# Patient Record
Sex: Female | Born: 1950
Health system: Southern US, Community
[De-identification: ages and names within clinical notes are randomized; demographics above are authoritative.]

## PROBLEM LIST (undated history)

## (undated) DIAGNOSIS — G540 Brachial plexus disorders: Secondary | ICD-10-CM

## (undated) DIAGNOSIS — W19XXXA Unspecified fall, initial encounter: Secondary | ICD-10-CM

## (undated) DIAGNOSIS — K259 Gastric ulcer, unspecified as acute or chronic, without hemorrhage or perforation: Secondary | ICD-10-CM

## (undated) DIAGNOSIS — K219 Gastro-esophageal reflux disease without esophagitis: Secondary | ICD-10-CM

## (undated) DIAGNOSIS — K648 Other hemorrhoids: Secondary | ICD-10-CM

## (undated) DIAGNOSIS — F329 Major depressive disorder, single episode, unspecified: Secondary | ICD-10-CM

## (undated) DIAGNOSIS — G43909 Migraine, unspecified, not intractable, without status migrainosus: Secondary | ICD-10-CM

## (undated) DIAGNOSIS — K2971 Gastritis, unspecified, with bleeding: Secondary | ICD-10-CM

## (undated) DIAGNOSIS — S91331A Puncture wound without foreign body, right foot, initial encounter: Secondary | ICD-10-CM

## (undated) DIAGNOSIS — S0993XA Unspecified injury of face, initial encounter: Secondary | ICD-10-CM

## (undated) DIAGNOSIS — J449 Chronic obstructive pulmonary disease, unspecified: Secondary | ICD-10-CM

## (undated) DIAGNOSIS — D509 Iron deficiency anemia, unspecified: Secondary | ICD-10-CM

## (undated) DIAGNOSIS — M1990 Unspecified osteoarthritis, unspecified site: Secondary | ICD-10-CM

## (undated) DIAGNOSIS — D649 Anemia, unspecified: Secondary | ICD-10-CM

## (undated) DIAGNOSIS — E781 Pure hyperglyceridemia: Secondary | ICD-10-CM

## (undated) DIAGNOSIS — D62 Acute posthemorrhagic anemia: Secondary | ICD-10-CM

## (undated) DIAGNOSIS — G709 Myoneural disorder, unspecified: Secondary | ICD-10-CM

## (undated) DIAGNOSIS — I509 Heart failure, unspecified: Secondary | ICD-10-CM

## (undated) DIAGNOSIS — J45909 Unspecified asthma, uncomplicated: Secondary | ICD-10-CM

## (undated) DIAGNOSIS — E119 Type 2 diabetes mellitus without complications: Secondary | ICD-10-CM

## (undated) DIAGNOSIS — I639 Cerebral infarction, unspecified: Secondary | ICD-10-CM

## (undated) DIAGNOSIS — I10 Essential (primary) hypertension: Secondary | ICD-10-CM

## (undated) DIAGNOSIS — Z87442 Personal history of urinary calculi: Secondary | ICD-10-CM

## (undated) DIAGNOSIS — Z5189 Encounter for other specified aftercare: Secondary | ICD-10-CM

## (undated) DIAGNOSIS — I671 Cerebral aneurysm, nonruptured: Secondary | ICD-10-CM

## (undated) DIAGNOSIS — F32A Depression, unspecified: Secondary | ICD-10-CM

## (undated) DIAGNOSIS — G8929 Other chronic pain: Secondary | ICD-10-CM

## (undated) DIAGNOSIS — T7840XA Allergy, unspecified, initial encounter: Secondary | ICD-10-CM

## (undated) HISTORY — PX: TONSILLECTOMY: SUR1361

## (undated) HISTORY — PX: CARPAL TUNNEL RELEASE: SHX101

## (undated) HISTORY — PX: DILATION AND CURETTAGE OF UTERUS: SHX78

## (undated) HISTORY — PX: HEMATOMA EVACUATION: SHX5118

## (undated) HISTORY — DX: Gastric ulcer, unspecified as acute or chronic, without hemorrhage or perforation: K25.9

## (undated) HISTORY — PX: KNEE ARTHROSCOPY: SUR90

## (undated) HISTORY — PX: SHOULDER ARTHROSCOPY W/ ROTATOR CUFF REPAIR: SHX2400

## (undated) HISTORY — PX: ARTHRODESIS METATARSAL: SHX6565

## (undated) HISTORY — PX: COLONOSCOPY: SHX174

## (undated) HISTORY — DX: Encounter for other specified aftercare: Z51.89

## (undated) HISTORY — PX: TUBAL LIGATION: SHX77

## (undated) HISTORY — DX: Other hemorrhoids: K64.8

## (undated) HISTORY — DX: Gastritis, unspecified, with bleeding: K29.71

## (undated) HISTORY — DX: Cerebral aneurysm, nonruptured: I67.1

## (undated) HISTORY — DX: Unspecified fall, initial encounter: W19.XXXA

## (undated) HISTORY — DX: Puncture wound without foreign body, right foot, initial encounter: S91.331A

## (undated) HISTORY — DX: Iron deficiency anemia, unspecified: D50.9

## (undated) HISTORY — DX: Allergy, unspecified, initial encounter: T78.40XA

## (undated) HISTORY — PX: LAPAROSCOPIC CHOLECYSTECTOMY: SUR755

---
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
## 1984-06-13 HISTORY — PX: BILATERAL SALPINGOOPHORECTOMY: SHX1223

## 1984-06-13 HISTORY — PX: VAGINAL HYSTERECTOMY: SUR661

## 2008-06-13 DIAGNOSIS — E119 Type 2 diabetes mellitus without complications: Secondary | ICD-10-CM

## 2008-06-13 HISTORY — DX: Type 2 diabetes mellitus without complications: E11.9

## 2012-05-13 ENCOUNTER — Observation Stay (HOSPITAL_COMMUNITY)
Admission: EM | Admit: 2012-05-13 | Discharge: 2012-05-14 | Disposition: A | Discharge status: Home / Self Care | Payer: BC Managed Care – PPO | Attending: Internal Medicine | Admitting: Internal Medicine

## 2012-05-13 ENCOUNTER — Encounter (HOSPITAL_COMMUNITY): Payer: Self-pay | Admitting: Nurse Practitioner

## 2012-05-13 DIAGNOSIS — E1149 Type 2 diabetes mellitus with other diabetic neurological complication: Principal | ICD-10-CM | POA: Insufficient documentation

## 2012-05-13 DIAGNOSIS — E1169 Type 2 diabetes mellitus with other specified complication: Secondary | ICD-10-CM

## 2012-05-13 DIAGNOSIS — E1165 Type 2 diabetes mellitus with hyperglycemia: Secondary | ICD-10-CM

## 2012-05-13 DIAGNOSIS — E11621 Type 2 diabetes mellitus with foot ulcer: Secondary | ICD-10-CM

## 2012-05-13 DIAGNOSIS — E11628 Type 2 diabetes mellitus with other skin complications: Secondary | ICD-10-CM | POA: Diagnosis present

## 2012-05-13 DIAGNOSIS — L97909 Non-pressure chronic ulcer of unspecified part of unspecified lower leg with unspecified severity: Secondary | ICD-10-CM | POA: Insufficient documentation

## 2012-05-13 DIAGNOSIS — Z91199 Patient's noncompliance with other medical treatment and regimen due to unspecified reason: Secondary | ICD-10-CM | POA: Insufficient documentation

## 2012-05-13 DIAGNOSIS — M7989 Other specified soft tissue disorders: Secondary | ICD-10-CM

## 2012-05-13 DIAGNOSIS — I10 Essential (primary) hypertension: Secondary | ICD-10-CM

## 2012-05-13 DIAGNOSIS — IMO0002 Reserved for concepts with insufficient information to code with codable children: Secondary | ICD-10-CM

## 2012-05-13 DIAGNOSIS — Z8614 Personal history of Methicillin resistant Staphylococcus aureus infection: Secondary | ICD-10-CM | POA: Insufficient documentation

## 2012-05-13 DIAGNOSIS — L0390 Cellulitis, unspecified: Secondary | ICD-10-CM

## 2012-05-13 DIAGNOSIS — Z79899 Other long term (current) drug therapy: Secondary | ICD-10-CM | POA: Insufficient documentation

## 2012-05-13 DIAGNOSIS — L03119 Cellulitis of unspecified part of limb: Secondary | ICD-10-CM | POA: Diagnosis present

## 2012-05-13 DIAGNOSIS — L97509 Non-pressure chronic ulcer of other part of unspecified foot with unspecified severity: Secondary | ICD-10-CM

## 2012-05-13 DIAGNOSIS — G8929 Other chronic pain: Secondary | ICD-10-CM | POA: Insufficient documentation

## 2012-05-13 DIAGNOSIS — E118 Type 2 diabetes mellitus with unspecified complications: Secondary | ICD-10-CM | POA: Diagnosis present

## 2012-05-13 DIAGNOSIS — Z9119 Patient's noncompliance with other medical treatment and regimen: Secondary | ICD-10-CM | POA: Insufficient documentation

## 2012-05-13 HISTORY — DX: Myoneural disorder, unspecified: G70.9

## 2012-05-13 HISTORY — DX: Essential (primary) hypertension: I10

## 2012-05-13 HISTORY — DX: Other chronic pain: G89.29

## 2012-05-13 HISTORY — DX: Pure hyperglyceridemia: E78.1

## 2012-05-13 LAB — CBC
HCT: 42.9 % (ref 36.0–46.0)
Hemoglobin: 14.5 g/dL (ref 12.0–15.0)
MCH: 33 pg (ref 26.0–34.0)
MCHC: 33.8 g/dL (ref 30.0–36.0)
MCV: 97.7 fL (ref 78.0–100.0)
Platelets: 163 10*3/uL (ref 150–400)
RBC: 4.39 MIL/uL (ref 3.87–5.11)
RDW: 12.9 % (ref 11.5–15.5)
WBC: 9.7 10*3/uL (ref 4.0–10.5)

## 2012-05-13 LAB — BASIC METABOLIC PANEL
Calcium: 10.1 mg/dL (ref 8.4–10.5)
GFR calc non Af Amer: >90 mL/min (ref >90)
Glucose, Bld: 306 mg/dL — ABNORMAL HIGH (ref 70–99)
Sodium: 136 mEq/L (ref 135–145)

## 2012-05-13 LAB — GLUCOSE, CAPILLARY: Glucose-Capillary: 270 mg/dL — ABNORMAL HIGH (ref 70–99)

## 2012-05-13 MED ORDER — BUTALBITAL-APAP-CAFFEINE 50-325-40 MG PO TABS
1.0000 | ORAL_TABLET | Freq: Two times a day (BID) | ORAL | Status: DC | PRN
  Administered 2012-05-13: 1 via ORAL
  Filled 2012-05-13: qty 1

## 2012-05-13 MED ORDER — ENOXAPARIN SODIUM 40 MG/0.4ML ~~LOC~~ SOLN
40.0000 mg | SUBCUTANEOUS | Status: DC
  Administered 2012-05-13: 40 mg via SUBCUTANEOUS
  Filled 2012-05-13 (×2): qty 0.4

## 2012-05-13 MED ORDER — GABAPENTIN 600 MG PO TABS
600.0000 mg | ORAL_TABLET | Freq: Three times a day (TID) | ORAL | Status: DC
  Administered 2012-05-13 – 2012-05-14 (×2): 600 mg via ORAL
  Filled 2012-05-13 (×4): qty 1

## 2012-05-13 MED ORDER — SODIUM CHLORIDE 0.9 % IV BOLUS (SEPSIS)
1000.0000 mL | Freq: Once | INTRAVENOUS | Status: AC
  Administered 2012-05-13: 1000 mL via INTRAVENOUS

## 2012-05-13 MED ORDER — SODIUM CHLORIDE 0.9 % IV SOLN
3.0000 g | Freq: Once | INTRAVENOUS | Status: AC
  Administered 2012-05-13: 3 g via INTRAVENOUS
  Filled 2012-05-13: qty 3

## 2012-05-13 MED ORDER — ACETAMINOPHEN 650 MG RE SUPP: 650.0000 mg | Freq: Four times a day (QID) | RECTAL | Status: DC | PRN

## 2012-05-13 MED ORDER — VANCOMYCIN HCL IN DEXTROSE 1-5 GM/200ML-% IV SOLN
1000.0000 mg | Freq: Three times a day (TID) | INTRAVENOUS | Status: DC
  Administered 2012-05-13 – 2012-05-14 (×2): 1000 mg via INTRAVENOUS
  Filled 2012-05-13 (×4): qty 200

## 2012-05-13 MED ORDER — INSULIN ASPART 100 UNIT/ML ~~LOC~~ SOLN
0.0000 [IU] | Freq: Three times a day (TID) | SUBCUTANEOUS | Status: DC
  Administered 2012-05-13: 5 [IU] via SUBCUTANEOUS
  Administered 2012-05-14: 2 [IU] via SUBCUTANEOUS

## 2012-05-13 MED ORDER — ATENOLOL 50 MG PO TABS
50.0000 mg | ORAL_TABLET | Freq: Every day | ORAL | Status: DC
  Administered 2012-05-13: 50 mg via ORAL
  Filled 2012-05-13 (×2): qty 1

## 2012-05-13 MED ORDER — LISINOPRIL 5 MG PO TABS
5.0000 mg | ORAL_TABLET | Freq: Every day | ORAL | Status: DC
  Administered 2012-05-13 – 2012-05-14 (×2): 5 mg via ORAL
  Filled 2012-05-13 (×2): qty 1

## 2012-05-13 MED ORDER — DULOXETINE HCL 60 MG PO CPEP
90.0000 mg | ORAL_CAPSULE | Freq: Every day | ORAL | Status: DC
  Administered 2012-05-13 – 2012-05-14 (×2): 90 mg via ORAL
  Filled 2012-05-13 (×2): qty 1

## 2012-05-13 MED ORDER — NIACIN ER 500 MG PO TBCR
500.0000 mg | EXTENDED_RELEASE_TABLET | Freq: Two times a day (BID) | ORAL | Status: DC
Start: 2012-05-14 — End: 2012-05-14
  Administered 2012-05-14: 500 mg via ORAL
  Filled 2012-05-13 (×3): qty 1

## 2012-05-13 MED ORDER — ACETAMINOPHEN 325 MG PO TABS
650.0000 mg | ORAL_TABLET | Freq: Four times a day (QID) | ORAL | Status: DC | PRN
  Administered 2012-05-13: 650 mg via ORAL
  Filled 2012-05-13: qty 2

## 2012-05-13 MED ORDER — SODIUM CHLORIDE 0.9 % IV SOLN
INTRAVENOUS | Status: DC
  Administered 2012-05-14: 03:00:00 via INTRAVENOUS

## 2012-05-13 MED ORDER — HYDROCODONE-ACETAMINOPHEN 5-325 MG PO TABS: 1.0000 | ORAL_TABLET | Freq: Every evening | ORAL | Status: DC | PRN

## 2012-05-13 MED ORDER — METFORMIN HCL 500 MG PO TABS
500.0000 mg | ORAL_TABLET | Freq: Two times a day (BID) | ORAL | Status: DC
  Administered 2012-05-14: 500 mg via ORAL
  Filled 2012-05-13 (×3): qty 1

## 2012-05-13 MED ORDER — CYCLOBENZAPRINE HCL 10 MG PO TABS
10.0000 mg | ORAL_TABLET | Freq: Three times a day (TID) | ORAL | Status: DC | PRN
  Administered 2012-05-13: 10 mg via ORAL
  Filled 2012-05-13: qty 1

## 2012-05-13 MED ORDER — SODIUM CHLORIDE 0.9 % IV SOLN
3.0000 g | Freq: Four times a day (QID) | INTRAVENOUS | Status: DC
  Administered 2012-05-13 – 2012-05-14 (×3): 3 g via INTRAVENOUS
  Filled 2012-05-13 (×5): qty 3

## 2012-05-13 MED ORDER — CLINDAMYCIN PHOSPHATE 900 MG/50ML IV SOLN: 900.0000 mg | Freq: Once | INTRAVENOUS | Status: DC

## 2012-05-13 NOTE — ED Notes (Signed)
Pt with reddened tender areas to RLE onset while driving in car from [place] over this past week. Pt has been out of BP meds for past week also. Denies any SOB or CP.

## 2012-05-13 NOTE — Progress Notes (Signed)
VASCULAR LAB PRELIMINARY  PRELIMINARY  PRELIMINARY  PRELIMINARY  Right lower extremity venous Doppler completed.    Preliminary report:  There is no DVT or SVT noted in the right lower extremity.  Tonica Brasington, 05/13/2012, 12:40 PM

## 2012-05-13 NOTE — ED Notes (Signed)
2 nurses with IV start attempts, unsuccessful, IV team paged

## 2012-05-13 NOTE — ED Provider Notes (Signed)
Medical screening examination/treatment/procedure(s) were performed by non-physician practitioner and as supervising physician I was immediately available for consultation/collaboration.   Celene Kras, MD 05/13/12 [phone]

## 2012-05-13 NOTE — Progress Notes (Signed)
ANTIBIOTIC CONSULT NOTE - INITIAL  Pharmacy Consult for vancomycin and unasyn Indication: diabetic foot infection  Allergies  Allergen Reactions  . Soma (Carisoprodol) Itching and Rash    Patient Measurements: Weight: 209 lb 12.8 oz (95.165 kg) Adjusted Body Weight:   Vital Signs: Temp: 99.8 F (37.7 C) (12/01 1600) Temp src: Oral (12/01 1600) BP: 160/108 mmHg (12/01 1600) Pulse Rate: 109  (12/01 1600) Intake/Output from previous day:   Intake/Output from this shift:    Labs:  Basename 05/13/12 1059  WBC 9.7  HGB 14.5  PLT 163  LABCREA --  CREATININE 0.60   CrCl is unknown because there is no height on file for the current visit. No results found for this basename: VANCOTROUGH:2,VANCOPEAK:2,VANCORANDOM:2,GENTTROUGH:2,GENTPEAK:2,GENTRANDOM:2,TOBRATROUGH:2,TOBRAPEAK:2,TOBRARND:2,AMIKACINPEAK:2,AMIKACINTROU:2,AMIKACIN:2, in the last 72 hours   Microbiology: No results found for this or any previous visit (from the past 720 hour(s)).  Medical History: Past Medical History  Diagnosis Date  . Hypertension   . Diabetes mellitus without complication     diagnosed around 2010, only ever on metformin  . Chronic pain     neck pain, headache, neuropathy  . Hypertriglyceridemia     Medications:  Scheduled:    . [COMPLETED] ampicillin-sulbactam (UNASYN) IV  3 g Intravenous Once  . atenolol  50 mg Oral Daily  . DULoxetine  90 mg Oral Daily  . enoxaparin (LOVENOX) injection  40 mg Subcutaneous Q24H  . gabapentin  600 mg Oral TID  . insulin aspart  0-9 Units Subcutaneous TID WC  . lisinopril  5 mg Oral Daily  . metFORMIN  500 mg Oral BID WC  . niacin  500 mg Oral BID WC  . [COMPLETED] sodium chloride  1,000 mL Intravenous Once  . [DISCONTINUED] clindamycin (CLEOCIN) IV  900 mg Intravenous Once   Infusions:    . sodium chloride     Assessment: 61 yo female with diabetic foot infection will be started on vancomycin and unasyn therapy.  CrCl >100.  Had one dose of  unasyn at 1407 today  Goal of Therapy:  Vancomycin trough level 15-20 mcg/ml  Plan:  1) Vancomycin 1g iv q8h and unasyn 3g iv q6h, 1st dose at 2000 2) Monitor renal function and culture and sensitivity 3) Check vancomycin trough when it's appropriate.  [name], [name] 05/13/2012,6:09 PM

## 2012-05-13 NOTE — ED Notes (Signed)
IV team at the bedside. 

## 2012-05-13 NOTE — ED Notes (Signed)
MD at bedside; admitting

## 2012-05-13 NOTE — ED Provider Notes (Signed)
History     CSN: [id number]  Arrival date & time 05/13/12  1047   First MD Initiated Contact with Patient 05/13/12 1146      Chief Complaint  Patient presents with  . Leg Pain    (Consider location/radiation/quality/duration/timing/severity/associated sxs/prior treatment) HPI The patient presents to the ED with R LE pain and swelling for 4 days. She reports noticing a R LE plantar skin lesion with erythremia along the medial aspect of her foot 4 days ago.  Today she reports R knee pain and swelling. She reports a cross country relocation that started in early November, driving approximately hours per day.  She has a family history of DVT in both mother and father.  She reports her last A1C is 10.  She reports being non-compliant with her atenolol 50 mg for 10 days due to running out of her prescription. She denies chest pain, shortness of breath, fever, chills, nausea, vomiting, abdominal pain, diarrhea, dizziness, lightheadedness, syncope, headache, or decrease in vision or blurry vision.   Past Medical History  Diagnosis Date  . Hypertension   . Diabetes mellitus without complication   . Chronic pain     Past Surgical History  Procedure Date  . Rotator cuff repair   . Carpal tunnel release   . Abdominal hysterectomy     History reviewed. No pertinent family history.  History  Substance Use Topics  . Smoking status: Never Smoker   . Smokeless tobacco: Not on file  . Alcohol Use: No    OB History    Grav Para Term Preterm Abortions TAB SAB Ect Mult Living                  Review of Systems All other systems negative except as documented in the HPI. All pertinent positives and negatives as reviewed in the HPI.   Allergies  Soma  Home Medications  No current outpatient prescriptions on file.  BP 143/104  Pulse 124  Temp 99.9 F (37.7 C) (Oral)  Resp 16  SpO2 99%  Physical Exam  Nursing note and vitals reviewed. Constitutional: She appears  well-developed and well-nourished.  HENT:  Head: Normocephalic and atraumatic.  Neck: Neck supple.  Cardiovascular: Regular rhythm, S1 normal and S2 normal.  Tachycardia present.   No murmur heard. Pulmonary/Chest: Effort normal and breath sounds normal. No accessory muscle usage. Not tachypneic. No respiratory distress. She has no decreased breath sounds. She has no wheezes. She has no rhonchi. She has no rales.  Musculoskeletal:       Right knee: She exhibits erythema. She exhibits normal range of motion.       Legs:      Feet:       R LE tenderness to palpation along the tibial tuberosity with erythremia and increased warmth.  1 cm laceration to plantar surface of R foot with surrounding erythremia, tenderness, and increased warmth. R distal medial shin tenderness  erythremia and increased warmth.   Neurological: She is alert.  Skin: Skin is warm and dry.    ED Course  Procedures (including critical care time)  Labs Reviewed  BASIC METABOLIC PANEL - Abnormal; Notable for the following:    Glucose, Bld 306 (*)     All other components within normal limits  CBC    The patient will be admitted for cellulitis. The patient is advised of the plan.    MDM  MDM Reviewed: vitals and nursing note Interpretation: labs  Carlyle Dolly, PA-C 05/13/12 1533

## 2012-05-13 NOTE — H&P (Signed)
Hospital Admission Note Date: 05/13/2012  Patient name: [name] Medical record number: [id number] Date of birth: [birth year] Age: 61 y.o. Gender: female PCP: No primary provider on file.  Medical Service: [facility]  Attending physician:  Dr. Rogelia Boga    1st Contact:  Dr. Burtis Junes  Pager: [phone] 2nd Contact:  Dr. Manson Passey  Pager: [phone] After 5 pm or weekends: 1st Contact:      Pager: [phone] 2nd Contact:      Pager: [phone]  Chief Complaint: Increasing right foot "redness"  History of Present Illness: [name] 61 yo woman pmh HTN, DM w/associated sever neuropathy, and hypertriglyceridemia p/w increasing right foot redness for past 4 days. Pt recently moved to [place], [place] area 2 days ago from [place], [place] after her husband's recent death in 2013/08/032/2 financial stress. She drove from there with her daughter for only about 4 hrs/day with frequent stops. She states that about 4 days ago she noticed a "hole" in her right foot but couldn't find the cause for such a wound. She doesn't perform regular foot care despite her neuropathy and doesn't wear shoes both indoors and outdoors as personal preference. 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 Because of her neuropathy she hasn't noticed much "new pain" from her baseline but did see some surrounding erythema that then began to rapidly expand over the last day and then she became concerned and presented to the ED. She denied any subjective f/c, n/v/d/c, abdominal pain, leg pain, SOB, DOE, CP, HA, weight loss/gain, or associated leg weakness. She did have an established PCP in [place] but hasn't had an office visit in "a long time." Pt doesn't measure CBGs at home and only takes metformin. She was diagnosed with DM about 2-3 yrs ago and has never been on insulin. She has had severe neuropathy per her report since that time requiring maximum dose gabapentin and cymbalta. She is a smoker about 0.5ppd and has about 22 pack year history.  She is compliant with her medications but ran out of atenolol about 2 wks ago on the drive to [place]. She has had extensive surgeries: hysterectomy, cholecystectomy, rotator cuff repair, and carpal tunnel surgery but no artifical joints or pacemakers. 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 She does report depression, decreased appetite eating 1 meal a day, and decreased sleep that is chronic for her. She denied any SI or HI at this time and is still grieving both her husband's death and long time home in [place]. She describes a mechanical injury following the transit of a patient at work that left her with tension HA and chronic cervical neck pain. She was otherwise feeling her normal self and didn't have recent sick contacts.   Meds: Current Outpatient Rx  Name  Route  Sig  Dispense  Refill  . ATENOLOL 50 MG PO TABS   Oral   Take 50 mg by mouth daily.         Marland Kitchen BUTALBITAL-APAP-CAFFEINE 50-325-40 MG PO TABS   Oral   Take 1 tablet by mouth 2 (two) times daily as needed. Stress headache         . CYCLOBENZAPRINE HCL 10 MG PO TABS   Oral   Take 10 mg by mouth every 6 (six) hours as needed. For muscle spasms         . DULOXETINE HCL  30 MG PO CPEP   Oral   Take 30 mg by mouth 2 (two) times daily. 30 mg in addition to 60 mg to equal 90 mg         . DULOXETINE HCL 60 MG PO CPEP   Oral   Take 60 mg by mouth daily. 60 mg in addition to 30 mg to equal 90 mg         . GABAPENTIN 600 MG PO TABS   Oral   Take 600 mg by mouth 3 (three) times daily. 3qam 2 noon 3qhs         . HYDROCODONE-ACETAMINOPHEN 5-325 MG PO TABS   Oral   Take 1 tablet by mouth every 6 (six) hours as needed. For pain         . LISINOPRIL PO   Oral   Take 1 tablet by mouth daily.         Marland Kitchen METFORMIN HCL 500 MG PO TABS   Oral   Take 500 mg by mouth 2 (two) times daily with a meal.         . NIACIN ER  500 MG PO TBCR   Oral   Take 500 mg by mouth 2 (two) times daily with a meal.           Allergies: Allergies as of 05/13/2012 - Review Complete 05/13/2012  Allergen Reaction Noted  . Soma (carisoprodol) Itching and Rash 05/13/2012   Past Medical History  Diagnosis Date  . Hypertension   . Diabetes mellitus without complication     diagnosed around 2010, only ever on metformin  . Chronic pain     neck pain, headache, neuropathy  . Hypertriglyceridemia    Past Surgical History  Procedure Date  . Rotator cuff repair   . Carpal tunnel release   . Abdominal hysterectomy   . Cholecystectomy    History reviewed. No pertinent family history. History   Social History  . Marital Status: Widowed    Spouse Name: N/A    Number of Children: N/A  . Years of Education: N/A   Occupational History  . Not on file.   Social History Main Topics  . Smoking status: Never Smoker   . Smokeless tobacco: Not on file  . Alcohol Use: No  . Drug Use: No  . Sexually Active:    Other Topics Concern  . Not on file   Social History Narrative  . No narrative on file    Review of Systems: Pertinent items are noted in HPI.  Physical Exam: Blood pressure 143/104, pulse 124, temperature 99.6 F (37.6 C), temperature source Oral, resp. rate 16, SpO2 99.00%. 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  Assessment & Plan by Problem: Ms. Stambaugh 61 yo woman pmh uncontrolled DM, HTN, and hypertriglyceridemia p/w increased foot pain suspected diabetic foot ulcer.   1. Diabetic foot ulcer: pt has poorly controlled and unmonitored DM with associated sever neuropathy s/p a wound with poor foot care. No leukocytosis or fever and doesn't appear to be osteomyelitis. Pt has hx of MRSA infxn. Pt tachycardic maybe 2/2 dehydratio -Unasyn and Vanco -imaging if erythema continues to expand or increased pain -cont gabapentin and cymbalta for neuropathy -foot care -IVF NS 100cc/hr for dehydration  2. DM: pt poorly controlled, poor PCP f/u and doesn't check CBGs.  -cont metformin 500 BID -SSI -HgbA1c  3. HTN: pt BPs 160s/100s and reports taking atenolol and lisinopril -cont atenolol 50mg  -start lisinopril 5mg  -continue to monitor  Dispo: Disposition is deferred at this time, awaiting improvement of current medical problems. Anticipated discharge in approximately 1-2 day(s).   The patient does not have a current PCP (No primary provider on file.), therefore is requiring OPC follow-up after discharge.   The patient does not have transportation limitations that hinder transportation to clinic appointments.  Signed:  Christen Bame, MD PGY-1 05/13/2012, 4:09 PM  [phone]

## 2012-05-14 DIAGNOSIS — E1165 Type 2 diabetes mellitus with hyperglycemia: Secondary | ICD-10-CM

## 2012-05-14 LAB — LIPID PANEL
HDL: 38 mg/dL — ABNORMAL LOW (ref >39)
LDL Cholesterol: 102 mg/dL — ABNORMAL HIGH (ref 0–99)
Total CHOL/HDL Ratio: 5.7 RATIO
Triglycerides: 387 mg/dL — ABNORMAL HIGH (ref <150)
VLDL: 77 mg/dL — ABNORMAL HIGH (ref 0–40)

## 2012-05-14 LAB — GLUCOSE, CAPILLARY: Glucose-Capillary: 192 mg/dL — ABNORMAL HIGH (ref 70–99)

## 2012-05-14 LAB — CBC WITH DIFFERENTIAL/PLATELET
Basophils Absolute: 0.1 10*3/uL (ref 0.0–0.1)
HCT: 35.3 % — ABNORMAL LOW (ref 36.0–46.0)
Hemoglobin: 11.5 g/dL — ABNORMAL LOW (ref 12.0–15.0)
Lymphocytes Relative: 23 % (ref 12–46)
Monocytes Absolute: 0.6 10*3/uL (ref 0.1–1.0)
Neutro Abs: 4.1 10*3/uL (ref 1.7–7.7)
Neutrophils Relative %: 63 % (ref 43–77)
RDW: 12.9 % (ref 11.5–15.5)
WBC: 6.6 10*3/uL (ref 4.0–10.5)

## 2012-05-14 LAB — COMPREHENSIVE METABOLIC PANEL
AST: 41 U/L — ABNORMAL HIGH (ref 0–37)
Albumin: 3.1 g/dL — ABNORMAL LOW (ref 3.5–5.2)
Calcium: 9.2 mg/dL (ref 8.4–10.5)
Creatinine, Ser: 0.57 mg/dL (ref 0.50–1.10)
GFR calc non Af Amer: >90 mL/min (ref >90)

## 2012-05-14 LAB — MICROALBUMIN / CREATININE URINE RATIO
Creatinine, Urine: 96.2 mg/dL
Microalb Creat Ratio: 41.6 mg/g — ABNORMAL HIGH (ref 0.0–30.0)
Microalb, Ur: 4 mg/dL — ABNORMAL HIGH (ref 0.00–1.89)

## 2012-05-14 LAB — PROTIME-INR: INR: 1.01 (ref 0.00–1.49)

## 2012-05-14 MED ORDER — CLINDAMYCIN HCL 300 MG PO CAPS: 300.0000 mg | ORAL_CAPSULE | Freq: Three times a day (TID) | ORAL | Status: AC

## 2012-05-14 MED ORDER — LISINOPRIL 20 MG PO TABS: 20.0000 mg | ORAL_TABLET | Freq: Every day | ORAL | Status: DC

## 2012-05-14 NOTE — Discharge Summary (Signed)
Internal Medicine Teaching [facility] Discharge Note  Name: [name] MRN: [id number] DOB: 07/30/1950 61 y.o.  Date of Admission: 05/13/2012 11:42 AM Date of Discharge: 05/14/2012 Attending Physician: Burns Spain, MD  Discharge Diagnosis: Principal Problem:  *Diabetic foot ulcer Active Problems:  Diabetes type 2, uncontrolled  Hypertension  Hypertriglyceridemia   Discharge Medications:   Medication List     As of 05/14/2012 11:53 AM    STOP taking these medications         atenolol 50 MG tablet   Commonly known as: TENORMIN      TAKE these medications         butalbital-acetaminophen-caffeine 50-325-40 MG per tablet   Commonly known as: FIORICET, ESGIC   Take 1 tablet by mouth 2 (two) times daily as needed. Stress headache      clindamycin 300 MG capsule   Commonly known as: CLEOCIN   Take 1 capsule (300 mg total) by mouth 3 (three) times daily.      cyclobenzaprine 10 MG tablet   Commonly known as: FLEXERIL   Take 10 mg by mouth every 6 (six) hours as needed. For muscle spasms      DULoxetine 30 MG capsule   Commonly known as: CYMBALTA   Take 30 mg by mouth 2 (two) times daily. 30 mg in addition to 60 mg to equal 90 mg      DULoxetine 60 MG capsule   Commonly known as: CYMBALTA   Take 60 mg by mouth daily. 60 mg in addition to 30 mg to equal 90 mg      gabapentin 600 MG tablet   Commonly known as: NEURONTIN   Take 600 mg by mouth 3 (three) times daily. 3qam 2 noon 3qhs      HYDROcodone-acetaminophen 5-325 MG per tablet   Commonly known as: NORCO/VICODIN   Take 1 tablet by mouth every 6 (six) hours as needed. For pain      lisinopril 20 MG tablet   Commonly known as: PRINIVIL,ZESTRIL   Take 1 tablet (20 mg total) by mouth daily.      metFORMIN 500 MG tablet   Commonly known as: GLUCOPHAGE   Take 500 mg by mouth 2 (two) times daily with a meal.      niacin 500 MG tablet   Commonly known as: SLO-NIACIN   Take 500 mg by mouth 2 (two)  times daily with a meal.        Disposition and follow-up:   [name] was discharged from [facility] in stable condition.  At the hospital follow up visit please address DM management, DM education, foot care, and re-establishing PCP since recent move.   Follow-up Appointments:      Discharge Orders    Future Appointments: Provider: Department: Dept Phone: Center:   05/17/2012 8:45 AM Manuela Schwartz, MD [facility] [phone] [facility]   05/17/2012 11:30 AM Cecil Cranker Plyler, RD [facility] [phone] [facility]     Future Orders Please Complete By Expires   Diet - low sodium heart healthy      Increase activity slowly         Admission HPI: [name] 61 yo woman pmh HTN, DM w/associated sever neuropathy, and hypertriglyceridemia p/w increasing right foot redness for past 4 days. Pt recently moved to [place], [place] area 2 days ago from [place], [place] after her husband's recent death in 08-12-132/2 financial stress. She drove from  there with her daughter for only about 4 hrs/day with frequent stops. She states that about 4 days ago she noticed a "hole" in her right foot but couldn't find the cause for such a wound. She doesn't perform regular foot care despite her neuropathy and doesn't wear shoes both indoors and outdoors as personal preference. 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 Because of her neuropathy she hasn't noticed much "new pain" from her baseline but did see some surrounding erythema that then began to rapidly expand over the last day and then she became concerned and presented to the ED. She denied any subjective f/c, n/v/d/c, abdominal pain, leg pain, SOB, DOE, CP, HA, weight loss/gain, or associated leg weakness. She did have an established PCP in [place] but hasn't had an office visit in "a long time." Pt doesn't measure CBGs at home and only takes metformin. She was diagnosed with DM about 2-3  yrs ago and has never been on insulin. She has had severe neuropathy per her report since that time requiring maximum dose gabapentin and cymbalta. She is a smoker about 0.5ppd and has about 22 pack year history. She is compliant with her medications but ran out of atenolol about 2 wks ago on the drive to [place]. She has had extensive surgeries: hysterectomy, cholecystectomy, rotator cuff repair, and carpal tunnel surgery but no artifical joints or pacemakers. 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 She does report depression, decreased appetite eating 1 meal a day, and decreased sleep that is chronic for her. She denied any SI or HI at this time and is still grieving both her husband's death and long time home in [place]. She describes a mechanical injury following the transit of a patient at work that left her with tension HA and chronic cervical neck pain. She was otherwise feeling her normal self and didn't have recent sick contacts.   Hospital Course by problem list: 1. Diabetic foot ulcer: pt has poorly controlled and unmonitored DM with associated severe neuropathy s/p a wound with poor foot care. No leukocytosis or fever and doesn't appear to be osteomyelitis. Pt has hx of MRSA infxn while taking care of of her sick husband. Pt was origionally tachycardic and resolved w/IVF. She received IV Unasyn and Vanco and then stopped on d/c. She was continued on a 7 day course of Ciprofloxacin.   2. DM: pt poorly controlled HgbA1c 8.3, poor PCP f/u and doesn't check CBGs at home. Only taking metformin 500 BID   3. HTN: pt BPs 160s/100s and reports taking atenolol and lisinopril at home but had recently ran out. She was continued  atenolol 50mg  and started lisinopril 5mg     Discharge Vitals:  BP 171/85  Pulse 106  Temp 98.6 F (37 C) (Oral)  Resp 18  Ht 5\' 7"  (1.702 m)  Wt 209 lb 12.8 oz (95.165 kg)   BMI 32.86 kg/m2  SpO2 94% General: resting in bed, NAD  HEENT: PERRL, EOMI, no scleral icterus  Cardiac: RRR, no rubs, murmurs or gallops  Pulm: clear to auscultation bilaterally, moving normal volumes of air  Abd: soft, nontender, nondistended, BS present  Ext: warm and well perfused, no pedal edema, right foot with 2mm circular lesion under the 1st MP joint with some surrounding erythema much decreased from demarked area yesterday. 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, intact sensation on LE with some decreased on soles of feet bilaterally  Neuro: alert and oriented X3, cranial nerves II-XII grossly intact     Discharge Labs:  Results for orders placed during the hospital encounter of 05/13/12 (from the past 24 hour(s))  TSH     Status: Normal   Collection Time   05/13/12  6:44 PM      Component Value Range   TSH 1.582  0.350 - 4.500 uIU/mL  HEMOGLOBIN A1C     Status: Abnormal   Collection Time   05/13/12  6:44 PM      Component Value Range   Hemoglobin A1C 8.3 (*) <5.7 %   Mean Plasma Glucose 192 (*) <117 mg/dL  GLUCOSE, CAPILLARY     Status: Abnormal   Collection Time   05/13/12  7:09 PM      Component Value Range   Glucose-Capillary 270 (*) 70 - 99 mg/dL   Comment 1 Notify RN    MRSA PCR SCREENING     Status: Normal   Collection Time   05/13/12  9:09 PM      Component Value Range   MRSA by PCR NEGATIVE  NEGATIVE  GLUCOSE, CAPILLARY     Status: Abnormal   Collection Time   05/13/12  9:25 PM      Component Value Range   Glucose-Capillary 213 (*) 70 - 99 mg/dL   Comment 1 Notify RN    COMPREHENSIVE METABOLIC PANEL     Status: Abnormal   Collection Time   05/14/12  5:00 AM      Component Value Range   Sodium 139  135 - 145 mEq/L   Potassium 4.1  3.5 - 5.1 mEq/L   Chloride 105  96 - 112 mEq/L   CO2 25  19 - 32  mEq/L   Glucose, Bld 200 (*) 70 - 99 mg/dL   BUN 7  6 - 23 mg/dL   Creatinine, Ser 4.40  0.50 - 1.10 mg/dL   Calcium 9.2  8.4 - 10.2 mg/dL   Total Protein 6.6  6.0 - 8.3 g/dL   Albumin 3.1 (*) 3.5 - 5.2 g/dL   AST 41 (*) 0 - 37 U/L   ALT 38 (*) 0 - 35 U/L   Alkaline Phosphatase 66  39 - 117 U/L   Total Bilirubin 0.3  0.3 - 1.2 mg/dL   GFR calc non Af Amer >90  >90 mL/min   GFR calc Af Amer >90  >90 mL/min  PROTIME-INR     Status: Normal   Collection Time   05/14/12  5:00 AM      Component Value Range   Prothrombin Time 13.2  11.6 - 15.2 seconds   INR 1.01  0.00 - 1.49  CBC WITH DIFFERENTIAL     Status: Abnormal   Collection Time   05/14/12  5:00 AM      Component Value Range   WBC 6.6  4.0 - 10.5 K/uL   RBC 3.61 (*) 3.87 - 5.11 MIL/uL   Hemoglobin 11.5 (*) 12.0 - 15.0 g/dL   HCT 72.5 (*) 36.6 - 44.0 %   MCV 97.8  78.0 - 100.0 fL   MCH 31.9  26.0 - 34.0 pg   MCHC 32.6  30.0 - 36.0 g/dL   RDW 34.7  42.5 - 95.6 %   Platelets 153  150 - 400 K/uL  Neutrophils Relative 63  43 - 77 %   Neutro Abs 4.1  1.7 - 7.7 K/uL   Lymphocytes Relative 23  12 - 46 %   Lymphs Abs 1.5  0.7 - 4.0 K/uL   Monocytes Relative 10  3 - 12 %   Monocytes Absolute 0.6  0.1 - 1.0 K/uL   Eosinophils Relative 4  0 - 5 %   Eosinophils Absolute 0.2  0.0 - 0.7 K/uL   Basophils Relative 1  0 - 1 %   Basophils Absolute 0.1  0.0 - 0.1 K/uL  LIPID PANEL     Status: Abnormal   Collection Time   05/14/12  5:00 AM      Component Value Range   Cholesterol 217 (*) 0 - 200 mg/dL   Triglycerides 960 (*) <150 mg/dL   HDL 38 (*) >45 mg/dL   Total CHOL/HDL Ratio 5.7     VLDL 77 (*) 0 - 40 mg/dL   LDL Cholesterol 409 (*) 0 - 99 mg/dL  GLUCOSE, CAPILLARY     Status: Abnormal   Collection Time   05/14/12  7:43 AM      Component Value Range   Glucose-Capillary 192 (*) 70 - 99 mg/dL   Comment 1 Notify RN      Signed: Christen Bame 05/14/2012, 11:53 AM   Time Spent on Discharge: 35 min Services Ordered on Discharge:  none Equipment Ordered on Discharge: *none

## 2012-05-14 NOTE — Progress Notes (Signed)
Patient discharged to home. Discharge teaching completed including follow up care and medications.  Patient verbalizes understanding with no further questions.  Discharged per wheelchair with family.  Vital signs stable.

## 2012-05-14 NOTE — Progress Notes (Signed)
Subjective: Pt had no acute events overnight. Reports feeling much improved from yesterday. Pt anticipating and asking to go home today to see new grandson born this AM.   Objective: Vital signs in last 24 hours: Filed Vitals:   05/13/12 2142 05/14/12 0202 05/14/12 0537 05/14/12 1000  BP: 156/78 165/80 158/89 171/85  Pulse: 89 73 70 106  Temp: 98.4 F (36.9 C) 97.8 F (36.6 C) 98.2 F (36.8 C) 98.6 F (37 C)  TempSrc: Oral Oral Oral   Resp: 17 18 15 18   Height:      Weight:      SpO2: 95% 94% 97% 94%   Weight change:   Intake/Output Summary (Last 24 hours) at 05/14/12 1150 Last data filed at 05/14/12 0600  Gross per 24 hour  Intake 301.67 ml  Output    750 ml  Net -448.33 ml   General: resting in bed, NAD HEENT: PERRL, EOMI, no scleral icterus Cardiac: RRR, no rubs, murmurs or gallops Pulm: clear to auscultation bilaterally, moving normal volumes of air Abd: soft, nontender, nondistended, BS present Ext: warm and well perfused, no pedal edema, right foot with 2mm circular lesion under the 1st MP joint with some surrounding erythema much decreased from demarked area yesterday. 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, intact sensation on LE with some decreased on soles of feet bilaterally Neuro: alert and oriented X3, cranial nerves II-XII grossly intact  Lab Results: Basic Metabolic Panel:  Lab 05/14/12 4098 05/13/12 1059  NA 139 136  K 4.1 3.5  CL 105 98  CO2 25 24  GLUCOSE 200* 306*  BUN 7 8  CREATININE 0.57 0.60  CALCIUM 9.2 10.1  MG -- --  PHOS -- --   Liver Function Tests:  Lab 05/14/12 0500  AST 41*  ALT 38*  ALKPHOS 66  BILITOT 0.3  PROT 6.6  ALBUMIN 3.1*   CBC:  Lab 05/14/12 0500 05/13/12 1059  WBC 6.6 9.7  NEUTROABS 4.1 --  HGB 11.5* 14.5  HCT 35.3* 42.9  MCV 97.8 97.7  PLT 153 163     CBG:  Lab 05/14/12 0743 05/13/12 2125 05/13/12 1909  GLUCAP 192* 213* 270*   Hemoglobin A1C:  Lab 05/13/12 1844  HGBA1C 8.3*   Fasting Lipid Panel:  Lab 05/14/12 0500  CHOL 217*  HDL 38*  LDLCALC 102*  TRIG 387*  CHOLHDL 5.7  LDLDIRECT --   Thyroid Function Tests:  Lab 05/13/12 1844  TSH 1.582  T4TOTAL --  FREET4 --  T3FREE --  THYROIDAB --   Coagulation:  Lab 05/14/12 0500  LABPROT 13.2  INR 1.01    Micro Results: Recent Results (from the past 240 hour(s))  MRSA PCR SCREENING     Status: Normal   Collection Time   05/13/12  9:09 PM      Component Value Range Status Comment   MRSA by PCR NEGATIVE  NEGATIVE Final    Studies/Results: No results found. Medications: I have reviewed the patient's current medications. Scheduled Meds:   . [COMPLETED] ampicillin-sulbactam (UNASYN) IV  3 g Intravenous Once  . DULoxetine  90 mg Oral Daily  . gabapentin  600 mg Oral TID  . insulin aspart  0-9 Units Subcutaneous TID WC  . lisinopril  20 mg Oral Daily  . metFORMIN  500 mg Oral BID WC  . niacin  500 mg Oral  BID WC  . [COMPLETED] sodium chloride  1,000 mL Intravenous Once  . [DISCONTINUED] ampicillin-sulbactam (UNASYN) IV  3 g Intravenous Q6H  . [DISCONTINUED] atenolol  50 mg Oral Daily  . [DISCONTINUED] clindamycin (CLEOCIN) IV  900 mg Intravenous Once  . [DISCONTINUED] enoxaparin (LOVENOX) injection  40 mg Subcutaneous Q24H  . [DISCONTINUED] lisinopril  5 mg Oral Daily  . [DISCONTINUED] vancomycin  1,000 mg Intravenous Q8H   Continuous Infusions:   . sodium chloride 100 mL/hr at 05/14/12 0259   PRN Meds:.acetaminophen, acetaminophen, butalbital-acetaminophen-caffeine, cyclobenzaprine, HYDROcodone-acetaminophen Assessment/Plan: Ms. Fitzner 61 yo woman pmh uncontrolled DM, HTN, and hypertriglyceridemia p/w increased foot pain suspected diabetic foot ulcer.   1. Diabetic foot ulcer: pt has poorly controlled and unmonitored DM with associated sever  neuropathy s/p a wound with poor foot care. No leukocytosis or fever and doesn't appear to be osteomyelitis. Pt has hx of MRSA infxn. Pt tachycardic resolved this AM - d/c Unasyn and Vanco  -transition to oral clindamycin for 7 day course -education of foot care -DM educator    2. DM: pt poorly controlled HgbA1c 8.3, poor PCP f/u and doesn't check CBGs at home.  -cont metformin 500 BID  -SSI   3. HTN: pt BPs 160s/100s and reports taking atenolol and lisinopril  - will d/c atenelol in setting of diabetes -start lisinopril 20mg  -continue to monitor   Dispo: Disposition is deferred at this time, awaiting improvement of current medical problems. Anticipated discharge in approximately 1-2 day(s).  The patient does not have a current PCP (No primary provider on file.), therefore is requiring OPC follow-up after discharge.  The patient does not have transportation limitations that hinder transportation to clinic appointments.    LOS: 1 day   Christen Bame 05/14/2012, 11:50 AM [phone]

## 2012-05-14 NOTE — H&P (Signed)
Internal Medicine teaching Service Attending Dr.Ernestyne Caldwell. I have personally examined the patient and reviewed the h and P documented by the Resident. In brief  Chief complaint:Increased right foot pain  HOPI: DM with h/o MRSA cellulitis with recent hisotory of long car ride with diabetic neuropathy. 9 point review of system as documented in the Resident note. Social history admitting medication family history past surgical history allergies reviewed. Physical examination Notable for: Erythema around a black callus on the arch of her right foot. Folliculitis and swelling of the right leg just below the knee joint. Labs are significant for : uncontrolled DM  Imaging is significant for: EKG: Sinus tachycardia  A and P: Agree with resident note. Will need debridement of the callus area. Could obtain culture from the region of folliculitis to guide antibiotic therapy.

## 2012-05-15 ENCOUNTER — Telehealth: Payer: Self-pay | Admitting: Dietician

## 2012-05-15 NOTE — Telephone Encounter (Signed)
Discharge date:05-14-12 Call date: 05-15-12 Hospital follow up appointment date: 05-17-12  Calling to assist with transition of care from hospital to home.  Discharge medications reviewed: out of the following medicines and does NOT have prescriptions- thought she'd get them at hospital discharge. She is not sure if she should wait until Thursday to get them. Needs metformin, butalbital, cymbalta 30 and 60, gabapentin, vicodin.   Able to fill all prescriptions? No see- the above  Patient aware of hospital follow up appointments. Yes she was aware of both physician and CDE appointments  No problems with transportation. Will ask on follow up phone call to patient about if she should wait until thursday to take the medicines she is out of.  Other problems/concerns: no meter, but this can wait until Thursday to address  Will route to Dr, Burtis Junes for her recommendation.

## 2012-05-16 NOTE — Telephone Encounter (Signed)
Spoke with attending physician who felt patient is okay waiting until Thursday to get prescriptions for listed medicines. Tried to call pt to notify her of this.

## 2012-05-17 ENCOUNTER — Ambulatory Visit (INDEPENDENT_AMBULATORY_CARE_PROVIDER_SITE_OTHER): Payer: Self-pay | Admitting: Internal Medicine

## 2012-05-17 ENCOUNTER — Ambulatory Visit (INDEPENDENT_AMBULATORY_CARE_PROVIDER_SITE_OTHER): Payer: Self-pay | Admitting: Dietician

## 2012-05-17 ENCOUNTER — Encounter: Payer: Self-pay | Admitting: Licensed Clinical Social Worker

## 2012-05-17 ENCOUNTER — Encounter: Payer: Self-pay | Admitting: Internal Medicine

## 2012-05-17 VITALS — BP 180/113 | HR 104 | Temp 97.2°F | Ht 66.0 in | Wt 209.2 lb

## 2012-05-17 DIAGNOSIS — E11621 Type 2 diabetes mellitus with foot ulcer: Secondary | ICD-10-CM

## 2012-05-17 DIAGNOSIS — L02419 Cutaneous abscess of limb, unspecified: Secondary | ICD-10-CM

## 2012-05-17 DIAGNOSIS — E781 Pure hyperglyceridemia: Secondary | ICD-10-CM

## 2012-05-17 DIAGNOSIS — L03115 Cellulitis of right lower limb: Secondary | ICD-10-CM

## 2012-05-17 DIAGNOSIS — E1165 Type 2 diabetes mellitus with hyperglycemia: Secondary | ICD-10-CM

## 2012-05-17 DIAGNOSIS — E1142 Type 2 diabetes mellitus with diabetic polyneuropathy: Secondary | ICD-10-CM

## 2012-05-17 DIAGNOSIS — E1140 Type 2 diabetes mellitus with diabetic neuropathy, unspecified: Secondary | ICD-10-CM

## 2012-05-17 DIAGNOSIS — S91331A Puncture wound without foreign body, right foot, initial encounter: Secondary | ICD-10-CM

## 2012-05-17 DIAGNOSIS — E1169 Type 2 diabetes mellitus with other specified complication: Secondary | ICD-10-CM

## 2012-05-17 DIAGNOSIS — IMO0002 Reserved for concepts with insufficient information to code with codable children: Secondary | ICD-10-CM

## 2012-05-17 DIAGNOSIS — L97509 Non-pressure chronic ulcer of other part of unspecified foot with unspecified severity: Secondary | ICD-10-CM

## 2012-05-17 DIAGNOSIS — IMO0001 Reserved for inherently not codable concepts without codable children: Secondary | ICD-10-CM

## 2012-05-17 DIAGNOSIS — I10 Essential (primary) hypertension: Secondary | ICD-10-CM

## 2012-05-17 DIAGNOSIS — E1149 Type 2 diabetes mellitus with other diabetic neurological complication: Secondary | ICD-10-CM

## 2012-05-17 DIAGNOSIS — S91309A Unspecified open wound, unspecified foot, initial encounter: Secondary | ICD-10-CM

## 2012-05-17 DIAGNOSIS — L03119 Cellulitis of unspecified part of limb: Secondary | ICD-10-CM

## 2012-05-17 HISTORY — DX: Puncture wound without foreign body, right foot, initial encounter: S91.331A

## 2012-05-17 LAB — GLUCOSE, CAPILLARY: Glucose-Capillary: 205 mg/dL — ABNORMAL HIGH (ref 70–99)

## 2012-05-17 MED ORDER — METFORMIN HCL 500 MG PO TABS: 1000.0000 mg | ORAL_TABLET | Freq: Two times a day (BID) | ORAL | Status: DC

## 2012-05-17 MED ORDER — METOPROLOL TARTRATE 50 MG PO TABS: 50.0000 mg | ORAL_TABLET | Freq: Two times a day (BID) | ORAL | Status: DC

## 2012-05-17 MED ORDER — LISINOPRIL 20 MG PO TABS: 20.0000 mg | ORAL_TABLET | Freq: Every day | ORAL | Status: DC

## 2012-05-17 NOTE — Assessment & Plan Note (Signed)
Resolving, no edema today, mild erythema -cont Clindamycin

## 2012-05-17 NOTE — Progress Notes (Signed)
CSW met with Ms. Tomczak after pt's scheduled [facility] appt.  Ms. Tonkovich has recently relocated to the [place] area from [place].  Pt spouse passes away in Dec 09, 2011.  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  Ms. Feeback is insured through [facility] [place] which pt switched to and became effective as of Dec. 1, 2013.  Prior to the move, Ms. Silverio was seen at [facility] in [place].  Pt voiced concern with her housing situation and services for her grandchild.   Ms. Troop currently lives in Daughter #2 home and was recently told pt and Daughter #1 and children will need to move.  Daughter #2 recently gave birth and will be bring home infant and currently there are too many people in the household.  Pt does have income.  Pt states she desires a one floor plan, 3 bedroom rental for $900 or less a month.  CSW utilized [employer] and provided pt with listing of rental within pt's criteria.   Pt also voiced concern for her grandchildren of Daughter #2, 59 year old female, had mental disorder and the 59 year old female, has developmental disorder.  Daughter is switching children from [employer] to [facility] Medicaid.  Children are currently not in school due to the need to have birth certificates ordered from [place].  CSW provided information to [facility] for pt to inquire about establishing medical care for children.  And, CSW provided listing from [place] of mental health agencies for 59 year old and the 24/7 crisis line.  CSW contacted CPS, there is no deadline to get children enrolled in a [employer] per CPS. Pt denies add'l needs at this time and is aware CSW is available to assist as needed.

## 2012-05-17 NOTE — Assessment & Plan Note (Signed)
BP Readings from Last 3 Encounters:  05/17/12 180/113  05/14/12 171/85    Sodium  Date Value Range Status  05/14/2012 139  135 - 145 mEq/L Final     Potassium  Date Value Range Status  05/14/2012 4.1  3.5 - 5.1 mEq/L Final     Creatinine, Ser  Date Value Range Status  05/14/2012 0.57  0.50 - 1.10 mg/dL Final    Assessment:  Blood pressure control: severely elevated  Progress toward BP goal:  deteriorated  Comments: will resume beta-blocker today as pt has persistent tachycardia  Plan:  Medications:  Start metoprolol 50mg  bid and continue lisinopril at 20 mg qd  Educational resources provided: brochure;handout  Self management tools provided:    Other plans: bp recheck in 1 week

## 2012-05-17 NOTE — Patient Instructions (Addendum)
General Instructions: Your prescriptions have been called into the [facility] on [address]. -For your blood pressure: Start taking metoprolol 50 mg twice a day Take only 20 mg of Lisinopril once a day  -For your diabetes Pick up the Metformin from the pharmacy and take 1000 mg twice a day. You will meet with the Diabetic Educator today.  -Return to clinic in 1 week for a blood pressure recheck.   Treatment Goals:  Goals (1 Years of Data) as of 05/17/2012    None      Progress Toward Treatment Goals:  Treatment Goal 05/17/2012  Hemoglobin A1C unchanged  Blood pressure deteriorated    Self Care Goals & Plans:  Self Care Goal 05/17/2012  Manage my medications take my medicines as prescribed; bring my medications to every visit; refill my medications on time; follow the sick day instructions if I am sick  Monitor my health keep track of my blood pressure; check my feet daily  Eat healthy foods eat more vegetables; eat fruit for snacks and desserts; eat smaller portions; eat foods that are low in salt; drink diet soda or water instead of juice or soda       Care Management & Community Referrals:  Referral 05/17/2012  Referrals made for care management support diabetes educator; Child psychotherapist

## 2012-05-17 NOTE — Progress Notes (Signed)
  Subjective:    Patient ID: [name], female    DOB: 1951-04-29, 61 y.o.   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 She was admitted for a diabetic foot wound and cellulitis of the right MTP area and right shin.  She was treated with IV Vancomycin and Zosyn x1 day then transitioned to oral clindamycin which she reports compliance.  She has been afebrile, without chills, calf pain, sob, chest pain, myalgia or purulent drainage from the wound since discharge.  Of note, she has poor foot hygiene and often walks barefoot inside and outside.  Today she has slipper type shoes on without socks. She states that the erythema on her foot and shin has improved but she still has tenderness over the shin.  She has not been taking the metformin stating that she was not provided a prescription. She reports that she has only been taking lisinopril 40 mg, niacin, flexeril, gabapentin, and clindamycin.  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  Review of Systems  Constitutional: Negative for fever and chills.  Respiratory: Positive for shortness of breath.   Cardiovascular: Negative for chest pain, palpitations and leg swelling.  Musculoskeletal: Negative for arthralgias.  Skin: Positive for wound. Negative for rash.  Hematological: Does not bruise/bleed easily.       Objective:   Physical Exam  Constitutional: She is oriented to person, place, and time. She appears well-developed and well-nourished. No distress.  HENT:  Head: Normocephalic and atraumatic.  Neck: Normal range of motion. Neck supple.  Cardiovascular: Regular rhythm.  Tachycardia present.   No murmur heard. Pulmonary/Chest:  Effort normal and breath sounds normal.  Abdominal: Soft. Bowel sounds are normal.  Musculoskeletal: Normal range of motion. She exhibits no edema.       Legs:      Feet:  Neurological: She is alert and oriented to person, place, and time.  Skin: Skin is warm and dry.     Psychiatric: She has a normal mood and affect.          Assessment & Plan:  1. Htn: above goal, will resume beta blocker (pt needs cardioprotective benefits outweigh possible insulin resistance caused by BB)and continue ACEi -start metoprolol 50 mg bid which is equivalent to her prior regimen of atenolol 100 mg qd -continue lisinopril at 20 mg qd (pt reports taking 40 mg) -uptitrate as needed on f/u  2. DM Type2: uncontrolled, HgbA1c >8 -increase Metformin to 1000 mg bid -DME today  3. Foot wound with cellulitis: this appears more of a puncture wound than diabetic ulcer, pt reports having Tetanus shot 3 years ago at [facility] in [place] -cont 7 day course of Clindamycin  4. Cellulitis of right leg: improving on clindamycin, no calf tenderness or signs of DVT -cont to monitor improvement  5. Hypertriglyceridemia: pt with h/o TG levels ~3500 per her report, counseled on need for adequate control, pt declines gemfibrozil (Lopid) therapy and statins -cont Niacin -cont to monitor lipid level

## 2012-05-17 NOTE — Assessment & Plan Note (Addendum)
Hemoglobin A1C  Date Value Range Status  05/13/2012 8.3* <5.7 % Final     (NOTE)                                                                               According to the ADA Clinical Practice Recommendations for 2011, when     HbA1c is used as a screening test:      >=6.5%   Diagnostic of Diabetes Mellitus               (if abnormal result is confirmed)     5.7-6.4%   Increased risk of developing Diabetes Mellitus     References:Diagnosis and Classification of Diabetes Mellitus,Diabetes     Care,2011,34(Suppl 1):S62-S69 and Standards of Medical Care in             Diabetes - 2011,Diabetes Care,2011,34 (Suppl 1):S11-S61.     Assessment:  Diabetes control: fair control   Progress toward A1C goal:  unchanged  Comments: has not been taking metformin since discharged, stated that she was not given a prescription but has been taking 500 mg bid for many years  Plan:  Medications:  Will restart Metformin at 1000 mg bid  Home glucose monitoring:   Frequency:     Timing:    Instruction/counseling given: reminded to bring medications to each visit, discussed foot care, discussed diet, provided printed educational material and other instruction/counseling: met with DME  Educational resources provided: brochure;handout  Self management tools provided:    Other plans: if not at goal on Metformin 1000 mg bid will need insulin therapy

## 2012-05-17 NOTE — Assessment & Plan Note (Signed)
Cont Clindamycin course

## 2012-05-18 ENCOUNTER — Encounter: Payer: Self-pay | Admitting: Dietician

## 2012-05-18 NOTE — Progress Notes (Signed)
Medical Nutrition Therapy:  Appt start time: 1130 end time:  1200.  Assessment:  Primary concerns today: Blood sugar control and Meal planning.  Patient familiar with diabetes from caring with husband who had diabetes.  She is able to read labels, has a basic understanding of carbohydrate effect on blood sugar. Usual eating pattern includes 3 meals and  0-2 snacks per day. Usual physical activity includes limited by food would on right foot.    Progress Towards Goal(s):  In progress.   Nutritional Diagnosis:  NI-5.8.4 Inconsistent carbohydrate intake As related to lack of prior exposure to carb counting and portions.  As evidenced by her report and elevated blood sugar.    Intervention:  Nutrition education about carb counting. Coordination of care- informed her of how to obtain orange card and meter and supplies  Monitoring/Evaluation:  Dietary intake, exercise, blood sugars, and body weight in 4 week(s).

## 2012-05-24 ENCOUNTER — Ambulatory Visit: Payer: Self-pay | Admitting: Internal Medicine

## 2012-05-29 ENCOUNTER — Ambulatory Visit: Payer: Self-pay | Admitting: Internal Medicine

## 2012-05-31 ENCOUNTER — Encounter (HOSPITAL_COMMUNITY): Payer: Self-pay

## 2012-05-31 ENCOUNTER — Ambulatory Visit (HOSPITAL_COMMUNITY)
Admission: RE | Admit: 2012-05-31 | Discharge: 2012-05-31 | Disposition: A | Discharge status: Home / Self Care | Payer: BC Managed Care – PPO | Source: Ambulatory Visit | Attending: Internal Medicine | Admitting: Internal Medicine

## 2012-05-31 ENCOUNTER — Ambulatory Visit (INDEPENDENT_AMBULATORY_CARE_PROVIDER_SITE_OTHER): Payer: BC Managed Care – PPO | Admitting: Radiation Oncology

## 2012-05-31 ENCOUNTER — Other Ambulatory Visit: Payer: Self-pay | Admitting: *Deleted

## 2012-05-31 ENCOUNTER — Encounter: Payer: Self-pay | Admitting: Radiation Oncology

## 2012-05-31 VITALS — BP 180/118 | HR 130 | Temp 96.7°F | Resp 20 | Ht 65.0 in | Wt 198.2 lb

## 2012-05-31 DIAGNOSIS — X58XXXA Exposure to other specified factors, initial encounter: Secondary | ICD-10-CM

## 2012-05-31 DIAGNOSIS — R000 Tachycardia, unspecified: Secondary | ICD-10-CM

## 2012-05-31 DIAGNOSIS — S91309A Unspecified open wound, unspecified foot, initial encounter: Secondary | ICD-10-CM

## 2012-05-31 DIAGNOSIS — L02619 Cutaneous abscess of unspecified foot: Secondary | ICD-10-CM

## 2012-05-31 DIAGNOSIS — R7989 Other specified abnormal findings of blood chemistry: Secondary | ICD-10-CM

## 2012-05-31 DIAGNOSIS — L02419 Cutaneous abscess of limb, unspecified: Secondary | ICD-10-CM

## 2012-05-31 DIAGNOSIS — S91331A Puncture wound without foreign body, right foot, initial encounter: Secondary | ICD-10-CM

## 2012-05-31 DIAGNOSIS — L03115 Cellulitis of right lower limb: Secondary | ICD-10-CM

## 2012-05-31 DIAGNOSIS — I10 Essential (primary) hypertension: Secondary | ICD-10-CM

## 2012-05-31 DIAGNOSIS — E1169 Type 2 diabetes mellitus with other specified complication: Secondary | ICD-10-CM

## 2012-05-31 DIAGNOSIS — E11628 Type 2 diabetes mellitus with other skin complications: Secondary | ICD-10-CM

## 2012-05-31 DIAGNOSIS — M79609 Pain in unspecified limb: Secondary | ICD-10-CM | POA: Insufficient documentation

## 2012-05-31 DIAGNOSIS — R197 Diarrhea, unspecified: Secondary | ICD-10-CM

## 2012-05-31 DIAGNOSIS — L03119 Cellulitis of unspecified part of limb: Secondary | ICD-10-CM

## 2012-05-31 DIAGNOSIS — I517 Cardiomegaly: Secondary | ICD-10-CM | POA: Insufficient documentation

## 2012-05-31 DIAGNOSIS — R0602 Shortness of breath: Secondary | ICD-10-CM | POA: Insufficient documentation

## 2012-05-31 LAB — COMPREHENSIVE METABOLIC PANEL
Alkaline Phosphatase: 89 U/L (ref 39–117)
BUN: 13 mg/dL (ref 6–23)
CO2: 22 mEq/L (ref 19–32)
Creat: 0.86 mg/dL (ref 0.50–1.10)
Glucose, Bld: 155 mg/dL — ABNORMAL HIGH (ref 70–99)
Total Bilirubin: 0.4 mg/dL (ref 0.3–1.2)
Total Protein: 8.4 g/dL — ABNORMAL HIGH (ref 6.0–8.3)

## 2012-05-31 LAB — CBC WITH DIFFERENTIAL/PLATELET
Basophils Relative: 1 % (ref 0–1)
Eosinophils Absolute: 0.1 10*3/uL (ref 0.0–0.7)
Eosinophils Relative: 1 % (ref 0–5)
Hemoglobin: 15.6 g/dL — ABNORMAL HIGH (ref 12.0–15.0)
Lymphs Abs: 2.2 10*3/uL (ref 0.7–4.0)
MCH: 32.2 pg (ref 26.0–34.0)
MCHC: 33.5 g/dL (ref 30.0–36.0)
MCV: 96.1 fL (ref 78.0–100.0)
Monocytes Absolute: 0.6 10*3/uL (ref 0.1–1.0)
Monocytes Relative: 7 % (ref 3–12)
RBC: 4.85 MIL/uL (ref 3.87–5.11)

## 2012-05-31 MED ORDER — METRONIDAZOLE 500 MG PO TABS: 500.0000 mg | ORAL_TABLET | Freq: Three times a day (TID) | ORAL | Status: AC

## 2012-05-31 MED ORDER — METFORMIN HCL 500 MG PO TABS: 1000.0000 mg | ORAL_TABLET | Freq: Two times a day (BID) | ORAL | Status: DC

## 2012-05-31 MED ORDER — GABAPENTIN 600 MG PO TABS: 600.0000 mg | ORAL_TABLET | Freq: Three times a day (TID) | ORAL | Status: DC

## 2012-05-31 MED ORDER — IOHEXOL 350 MG/ML SOLN
100.0000 mL | Freq: Once | INTRAVENOUS | Status: AC | PRN
  Administered 2012-05-31: 100 mL via INTRAVENOUS

## 2012-05-31 MED ORDER — LISINOPRIL 20 MG PO TABS: 20.0000 mg | ORAL_TABLET | Freq: Every day | ORAL | Status: DC

## 2012-05-31 MED ORDER — CYCLOBENZAPRINE HCL 10 MG PO TABS: 10.0000 mg | ORAL_TABLET | Freq: Three times a day (TID) | ORAL | Status: DC | PRN

## 2012-05-31 NOTE — Patient Instructions (Addendum)
Instructions: - Please go to radiology on the 1st floor of [facility] tomorrow, 06/01/2012, at 12:45PM, for your xray and CT scan. Have a great day, please let us know if you need anything else from [place], or if your symptoms worsen or fail to improve. - Please begin taking  metronidazole which is an antibiotic you are being prescribed for your diarrhea.

## 2012-05-31 NOTE — Telephone Encounter (Signed)
Pt calls and states at her visit she told the physician she needed all her meds filled, only 3 were filled, she needs the remainder, i will send you the request also the metformin was filled for the wrong amt not #60 should have been #120, new directions state 2 tabs twice daily.  She also states she has had diarrhea since starting her abx but now has finished and diarrhea continues, she is growing concerned, appt given per [place]. 1415 dr Lavena Bullion

## 2012-06-01 ENCOUNTER — Other Ambulatory Visit (HOSPITAL_COMMUNITY): Payer: Self-pay

## 2012-06-01 ENCOUNTER — Ambulatory Visit (HOSPITAL_COMMUNITY): Payer: Self-pay

## 2012-06-01 DIAGNOSIS — R7989 Other specified abnormal findings of blood chemistry: Secondary | ICD-10-CM | POA: Insufficient documentation

## 2012-06-01 DIAGNOSIS — R000 Tachycardia, unspecified: Secondary | ICD-10-CM | POA: Insufficient documentation

## 2012-06-01 DIAGNOSIS — R197 Diarrhea, unspecified: Secondary | ICD-10-CM | POA: Insufficient documentation

## 2012-06-01 LAB — CLOSTRIDIUM DIFFICILE BY PCR: Toxigenic C. Difficile by PCR: NOT DETECTED

## 2012-06-01 MED ORDER — DULOXETINE HCL 60 MG PO CPEP: 60.0000 mg | ORAL_CAPSULE | Freq: Every day | ORAL | Status: DC

## 2012-06-01 MED ORDER — DULOXETINE HCL 30 MG PO CPEP: 30.0000 mg | ORAL_CAPSULE | Freq: Every day | ORAL | Status: DC

## 2012-06-01 NOTE — Assessment & Plan Note (Addendum)
Patient has tachycardia of unknown origin, although she believes it may be chronic, and is completely asymptomatic and feels well aside from her diarrhea. No subjective or objective fever or any other findings concerning for systemic infection. She has not been able to take her metoprolol, which seems to suppress her tachycardia to ~100bpm, however it is unclear if it was initially prescribed for this purpose, and pt is unsure herself. Her diarrhea is mild but coupled with her decreased PO intake this may be contributing to some extent to the issue. After discussion with Dr. Kem Kays, pt was sent for CTA chest given her recent history of a long car trip, tachycardia, and SOB. No PE was noted on CTA.  - obtain previous med records - address diarrhea/nausea - f/u in 1 wk  Filed Vitals:   05/31/12 1446  BP: 180/118  Pulse: 130  Temp:   Resp:     Ct Angio Chest Pe W/cm &/or Wo Cm  05/31/2012  *RADIOLOGY REPORT*  Clinical Data: Tachycardia, shortness of breath, right leg pain.  CT ANGIOGRAPHY CHEST  Technique:  Multidetector CT imaging of the chest using the standard protocol during bolus administration of intravenous contrast. Multiplanar reconstructed images including MIPs were obtained and reviewed to evaluate the vascular anatomy.  Contrast: OMNIPAQUE IOHEXOL 350 MG/ML SOLN  Comparison: None.  Findings: No evidence of pulmonary embolism.  Lungs are essentially clear.  No suspicious pulmonary nodules or No pleural effusion or pneumothorax.  The visualized thyroid is unremarkable.  The heart is top normal in size.  No pericardial effusion. Coronary atherosclerosis.  No suspicious mediastinal, hilar, or axillary lymphadenopathy.  Visualized upper abdomen is notable for severe hepatic steatosis.  Mild degenerative changes of the visualized thoracolumbar spine.  IMPRESSION: No evidence of pulmonary embolism.  No evidence of acute cardiopulmonary disease.  Age advanced coronary atherosclerosis.   Original  Report Authenticated By: Charline Bills, M.D.

## 2012-06-01 NOTE — Assessment & Plan Note (Signed)
Given pt just completed a course of clindamycin and states she has had c diff colitis in the past, she will be treated empirically at this time for c diff while results are pending from c diff PCR.  - c diff PCR - flagyl x 2 wks - f/u in 1 wk

## 2012-06-01 NOTE — Progress Notes (Signed)
Patient: [name]   MRN: [id number]  DOB: November 11, 1950  PCP: Manuela Schwartz, MD   Subjective:    HPI: [name] is a 61 y.o. female with a PMHx of T2 DM, hypertension, who presented to clinic today with complaints of diarrhea which has persisted after completing a course of clindamycin for lower extremity cellulitis approximately one week ago. Patient states the diarrhea is loose, yellow in color, foul-smelling, and that she has 3-4 such bowel movements per day for the last few days. She also complains of some nausea, which has limited her ability to eat as well as to take her oral medications. She denies any vomiting. She denies any fevers or chills. She admits to occasional shortness of breath, but states that this issue is improved since her previous visit. She states that her right foot wound is healing well. She denies any chest pain or palpitations. She has no other complaints today, and requests multiple medication refills.   Review of Systems: Per HPI.   Current Outpatient Medications: Current Outpatient Prescriptions  Medication Sig Dispense Refill  . butalbital-acetaminophen-caffeine (FIORICET, ESGIC) 50-325-40 MG per tablet Take 1 tablet by mouth 2 (two) times daily as needed. Stress headache      . cyclobenzaprine (FLEXERIL) 10 MG tablet Take 1 tablet (10 mg total) by mouth 3 (three) times daily as needed. For muscle spasms  90 tablet  1  . DULoxetine (CYMBALTA) 30 MG capsule Take 30 mg by mouth 2 (two) times daily. 30 mg in addition to 60 mg to equal 90 mg      . DULoxetine (CYMBALTA) 60 MG capsule Take 60 mg by mouth daily. 60 mg in addition to 30 mg to equal 90 mg      . gabapentin (NEURONTIN) 600 MG tablet Take 1 tablet (600 mg total) by mouth 3 (three) times daily. 3qam 2 noon 3qhs  90 tablet  5  . HYDROcodone-acetaminophen (NORCO/VICODIN) 5-325 MG per tablet Take 1 tablet by mouth every 6 (six) hours as needed. For pain      . lisinopril  (PRINIVIL,ZESTRIL) 20 MG tablet Take 1 tablet (20 mg total) by mouth daily.  30 tablet  5  . metFORMIN (GLUCOPHAGE) 500 MG tablet Take 2 tablets (1,000 mg total) by mouth 2 (two) times daily with a meal.  120 tablet  5  . metoprolol (LOPRESSOR) 50 MG tablet Take 1 tablet (50 mg total) by mouth 2 (two) times daily.  60 tablet  11  . metroNIDAZOLE (FLAGYL) 500 MG tablet Take 1 tablet (500 mg total) by mouth 3 (three) times daily.  42 tablet  0  . niacin (SLO-NIACIN) 500 MG tablet Take 500 mg by mouth 2 (two) times daily with a meal.         Allergies  Allergen Reactions  . Soma (Carisoprodol) Itching and Rash    Past Medical History  Diagnosis Date  . Hypertension   . Diabetes mellitus without complication     diagnosed around 2010, only ever on metformin  . Chronic pain     neck pain, headache, neuropathy  . Hypertriglyceridemia   . Neuromuscular disorder     Past Surgical History  Procedure Date  . Rotator cuff repair   . Carpal tunnel release   . Abdominal hysterectomy   . Cholecystectomy      Objective:    Physical Exam: Filed Vitals:   05/31/12 1446  BP: 180/118  Pulse: 130  Temp:   Resp:  General: Vital signs reviewed and noted. Well-developed, well-nourished, in no acute distress; alert, appropriate and cooperative throughout examination.  Head: Normocephalic, atraumatic.  Lungs:  Normal respiratory effort. Clear to auscultation BL without crackles or wheezes.  Heart: Tachycardic with regular rhythm. S1 and S2 normal without gallop, rubs. no murmur.  Abdomen:  BS normoactive. Soft, Nondistended, non-tender.  No masses or organomegaly.  Extremities: No pretibial edema. Mild erythema over R tibial tuberosity, with no edema, no inc warmth, and minimal TTP. R plantar foot wound healing well with no erythema or edema.    Assessment/ Plan:

## 2012-06-01 NOTE — Assessment & Plan Note (Signed)
Healing well. No evidence of infection. No further intervention necessary at this time.

## 2012-06-01 NOTE — Progress Notes (Signed)
Patient ID: [name], female   DOB: 06/26/1950, 61 y.o.   MRN: [id number] INTERNAL MEDICINE TEACHING ATTENDING ADDENDUM - Jonah Blue, DO: I personally saw and evaluated [name] in this clinic visit in conjunction with the resident, Dr. Lavena Bullion. I have discussed patient's plan of care with medical resident during this visit. I have confirmed the physical exam findings and have read and agree with the clinic note including the plan with the following addition: She is noted to have hypercalcemia which will need to be evaluated on next visit: iPTH and iCa.  She is noted to have sinus tachycardia which she states is chronic. Given her recent car trip, leg pain, SOB, CTA chest was performed and was negative for PE. Next week she will need to have TSH ordered to evaluate thyroid function. She is asking for multiple narcotic medications, but need urine drug screen and PCP records prior to considering. Search of [place] narcotic database shows no information. Agree with treatment for possible Cdiff given hx of Cdiff in past and recent treatment with clindamycin for foot infection. Jonah Blue

## 2012-06-01 NOTE — Assessment & Plan Note (Addendum)
Pt's CMET revealed an elevated Calcium level. This issue should be further investigated at next f/u. - check PTH, i-Ca at next visit  BMET    Component Value Date/Time   NA 140 05/31/2012 1626   K 4.1 05/31/2012 1626   CL 99 05/31/2012 1626   CO2 22 05/31/2012 1626   GLUCOSE 155* 05/31/2012 1626   BUN 13 05/31/2012 1626   CREATININE 0.86 05/31/2012 1626   CREATININE 0.57 05/14/2012 0500   CALCIUM 11.0* 05/31/2012 1626   GFRNONAA >90 05/14/2012 0500   GFRAA >90 05/14/2012 0500

## 2012-06-01 NOTE — Assessment & Plan Note (Addendum)
This area does not appear consistent with cellulitis at this time, however it is unclear how patient developed a very conspicuous R tibial tuberosity prominence over the last few weeks. XR was performed of the RLE, which revealed only some soft tissue swelling in the area. - recheck at f/u  Dg Tibia/fibula Right   05/31/2012  *RADIOLOGY REPORT*  Clinical Data: Bony prominence right knee  RIGHT TIBIA AND FIBULA - 2 VIEW  Comparison: None.  Findings: Negative for fracture.  No mass lesion.  Mild degenerative change in the knee joint with mild spurring of the patella and tibial spines.  There is a small joint effusion with small calcific loose bodies in the suprapatellar bursa.  Prominent tibial tubercle with overlying soft tissue swelling may reflect underlying tendonitis.  IMPRESSION: No acute bony abnormality.  Degenerative change in the knee joint.  Soft tissue swelling over the tibial tubercle   Original Report Authenticated By: Janeece Riggers, M.D.

## 2012-06-01 NOTE — Assessment & Plan Note (Signed)
Resolved, with no evidence of persistent infection.

## 2012-06-01 NOTE — Assessment & Plan Note (Addendum)
Patient has elevated AST/ALT on recent CMETs and also has severe hepatic steatosis as incidentally discovered on CTA chest today. This issue will require further investigation at a future f/u visit.  - obtain previous med records (as pt moved from [place] to [place] ~3 wks prior) - recheck CMET

## 2012-06-01 NOTE — Assessment & Plan Note (Signed)
BP elevated today 2/2 patient's inability to tolerate PO intake of pills due to nausea.  - recheck at f/u in 1 wk  BP Readings from Last 3 Encounters:  05/31/12 180/118  05/17/12 180/113  05/14/12 171/85

## 2012-06-02 ENCOUNTER — Emergency Department (HOSPITAL_COMMUNITY): Payer: BC Managed Care – PPO

## 2012-06-02 ENCOUNTER — Emergency Department (HOSPITAL_COMMUNITY)
Admission: EM | Admit: 2012-06-02 | Discharge: 2012-06-03 | Disposition: A | Discharge status: Home Health | Payer: BC Managed Care – PPO | Attending: Emergency Medicine | Admitting: Emergency Medicine

## 2012-06-02 ENCOUNTER — Encounter (HOSPITAL_COMMUNITY): Payer: Self-pay | Admitting: *Deleted

## 2012-06-02 ENCOUNTER — Other Ambulatory Visit: Payer: Self-pay

## 2012-06-02 DIAGNOSIS — E781 Pure hyperglyceridemia: Secondary | ICD-10-CM | POA: Insufficient documentation

## 2012-06-02 DIAGNOSIS — R5383 Other fatigue: Secondary | ICD-10-CM | POA: Insufficient documentation

## 2012-06-02 DIAGNOSIS — R5381 Other malaise: Secondary | ICD-10-CM | POA: Insufficient documentation

## 2012-06-02 DIAGNOSIS — Z79899 Other long term (current) drug therapy: Secondary | ICD-10-CM | POA: Insufficient documentation

## 2012-06-02 DIAGNOSIS — G8929 Other chronic pain: Secondary | ICD-10-CM | POA: Insufficient documentation

## 2012-06-02 DIAGNOSIS — L98499 Non-pressure chronic ulcer of skin of other sites with unspecified severity: Secondary | ICD-10-CM | POA: Insufficient documentation

## 2012-06-02 DIAGNOSIS — R531 Weakness: Secondary | ICD-10-CM

## 2012-06-02 DIAGNOSIS — R0602 Shortness of breath: Secondary | ICD-10-CM | POA: Insufficient documentation

## 2012-06-02 DIAGNOSIS — Z8669 Personal history of other diseases of the nervous system and sense organs: Secondary | ICD-10-CM | POA: Insufficient documentation

## 2012-06-02 DIAGNOSIS — I10 Essential (primary) hypertension: Secondary | ICD-10-CM | POA: Insufficient documentation

## 2012-06-02 DIAGNOSIS — N390 Urinary tract infection, site not specified: Secondary | ICD-10-CM

## 2012-06-02 DIAGNOSIS — E1169 Type 2 diabetes mellitus with other specified complication: Secondary | ICD-10-CM | POA: Insufficient documentation

## 2012-06-02 DIAGNOSIS — F17200 Nicotine dependence, unspecified, uncomplicated: Secondary | ICD-10-CM | POA: Insufficient documentation

## 2012-06-02 LAB — BASIC METABOLIC PANEL
BUN: 21 mg/dL (ref 6–23)
CO2: 20 mEq/L (ref 19–32)
Chloride: 101 mEq/L (ref 96–112)
GFR calc Af Amer: 75 mL/min — ABNORMAL LOW (ref >90)
Potassium: 4.1 mEq/L (ref 3.5–5.1)

## 2012-06-02 LAB — CBC
HCT: 38.8 % (ref 36.0–46.0)
Hemoglobin: 12.9 g/dL (ref 12.0–15.0)
RBC: 4.01 MIL/uL (ref 3.87–5.11)
RDW: 12.9 % (ref 11.5–15.5)
WBC: 7.3 10*3/uL (ref 4.0–10.5)

## 2012-06-02 NOTE — ED Notes (Signed)
Old and new EKG given to Dr Ignacia Palma.

## 2012-06-02 NOTE — ED Notes (Signed)
Pt apt reports falling this Afternoon and started feeling SHOB. Pt reports CP started 15 mins after arriving to ED.

## 2012-06-03 LAB — URINALYSIS, ROUTINE W REFLEX MICROSCOPIC
Bilirubin Urine: NEGATIVE
Glucose, UA: NEGATIVE mg/dL
Hgb urine dipstick: NEGATIVE
Ketones, ur: NEGATIVE mg/dL
Nitrite: NEGATIVE
Specific Gravity, Urine: 1.017 (ref 1.005–1.030)
pH: 5.5 (ref 5.0–8.0)

## 2012-06-03 LAB — URINE MICROSCOPIC-ADD ON

## 2012-06-03 MED ORDER — SULFAMETHOXAZOLE-TMP DS 800-160 MG PO TABS
1.0000 | ORAL_TABLET | Freq: Once | ORAL | Status: AC
  Administered 2012-06-03: 1 via ORAL
  Filled 2012-06-03: qty 1

## 2012-06-03 MED ORDER — SULFAMETHOXAZOLE-TRIMETHOPRIM 800-160 MG PO TABS: 1.0000 | ORAL_TABLET | Freq: Two times a day (BID) | ORAL | Status: AC

## 2012-06-03 NOTE — Discharge Instructions (Signed)
Drink plan fluids. Rest when you can. Continue taking your other medications as prescribed. Followup on Friday as scheduled with your primary care physician.  Fatigue Fatigue is a feeling of tiredness, lack of energy, lack of motivation, or feeling tired all the time. Having enough rest, good nutrition, and reducing stress will normally reduce fatigue. Consult your caregiver if it persists. The nature of your fatigue will help your caregiver to find out its cause. The treatment is based on the cause.  CAUSES  There are many causes for fatigue. Most of the time, fatigue can be traced to one or more of your habits or routines. Most causes fit into one or more of three general areas. They are: Lifestyle problems  Sleep disturbances.  Overwork.  Physical exertion.  Unhealthy habits.  Poor eating habits or eating disorders.  Alcohol and/or drug use .  Lack of proper nutrition (malnutrition). Psychological problems  Stress and/or anxiety problems.  Depression.  Grief.  Boredom. Medical Problems or Conditions  Anemia.  Pregnancy.  Thyroid gland problems.  Recovery from major surgery.  Continuous pain.  Emphysema or asthma that is not well controlled  Allergic conditions.  Diabetes.  Infections (such as mononucleosis).  Obesity.  Sleep disorders, such as sleep apnea.  Heart failure or other heart-related problems.  Cancer.  Kidney disease.  Liver disease.  Effects of certain medicines such as antihistamines, cough and cold remedies, prescription pain medicines, heart and blood pressure medicines, drugs used for treatment of cancer, and some antidepressants. SYMPTOMS  The symptoms of fatigue include:   Lack of energy.  Lack of drive (motivation).  Drowsiness.  Feeling of indifference to the surroundings. DIAGNOSIS  The details of how you feel help guide your caregiver in finding out what is causing the fatigue. You will be asked about your present and  past health condition. It is important to review all medicines that you take, including prescription and non-prescription items. A thorough exam will be done. You will be questioned about your feelings, habits, and normal lifestyle. Your caregiver may suggest blood tests, urine tests, or other tests to look for common medical causes of fatigue.  TREATMENT  Fatigue is treated by correcting the underlying cause. For example, if you have continuous pain or depression, treating these causes will improve how you feel. Similarly, adjusting the dose of certain medicines will help in reducing fatigue.  HOME CARE INSTRUCTIONS   Try to get the required amount of good sleep every night.  Eat a healthy and nutritious diet, and drink enough water throughout the day.  Practice ways of relaxing (including yoga or meditation).  Exercise regularly.  Make plans to change situations that cause stress. Act on those plans so that stresses decrease over time. Keep your work and personal routine reasonable.  Avoid street drugs and minimize use of alcohol.  Start taking a daily multivitamin after consulting your caregiver. SEEK MEDICAL CARE IF:   You have persistent tiredness, which cannot be accounted for.  You have fever.  You have unintentional weight loss.  You have headaches.  You have disturbed sleep throughout the night.  You are feeling sad.  You have constipation.  You have dry skin.  You have gained weight.  You are taking any new or different medicines that you suspect are causing fatigue.  You are unable to sleep at night.  You develop any unusual swelling of your legs or other parts of your body. SEEK IMMEDIATE MEDICAL CARE IF:   You are feeling  confused.  Your vision is blurred.  You feel faint or pass out.  You develop severe headache.  You develop severe abdominal, pelvic, or back pain.  You develop chest pain, shortness of breath, or an irregular or fast  heartbeat.  You are unable to pass a normal amount of urine.  You develop abnormal bleeding such as bleeding from the rectum or you vomit blood.  You have thoughts about harming yourself or committing suicide.  You are worried that you might harm someone else. MAKE SURE YOU:   Understand these instructions.  Will watch your condition.  Will get help right away if you are not doing well or get worse. Document Released: 03/27/2007 Document Revised: 08/22/2011 Document Reviewed: 03/27/2007 [facility] Patient Information 2013 [place], [place]. Urinary Tract Infection Urinary tract infections (UTIs) can develop anywhere along your urinary tract. Your urinary tract is your body's drainage system for removing wastes and extra water. Your urinary tract includes two kidneys, two ureters, a bladder, and a urethra. Your kidneys are a pair of bean-shaped organs. Each kidney is about the size of your fist. They are located below your ribs, one on each side of your spine. CAUSES Infections are caused by microbes, which are microscopic organisms, including fungi, viruses, and bacteria. These organisms are so small that they can only be seen through a microscope. Bacteria are the microbes that most commonly cause UTIs. SYMPTOMS  Symptoms of UTIs may vary by age and gender of the patient and by the location of the infection. Symptoms in young women typically include a frequent and intense urge to urinate and a painful, burning feeling in the bladder or urethra during urination. Older women and men are more likely to be tired, shaky, and weak and have muscle aches and abdominal pain. A fever may mean the infection is in your kidneys. Other symptoms of a kidney infection include pain in your back or sides below the ribs, nausea, and vomiting. DIAGNOSIS To diagnose a UTI, your caregiver will ask you about your symptoms. Your caregiver also will ask to provide a urine sample. The urine sample will be tested for  bacteria and white blood cells. White blood cells are made by your body to help fight infection. TREATMENT  Typically, UTIs can be treated with medication. Because most UTIs are caused by a bacterial infection, they usually can be treated with the use of antibiotics. The choice of antibiotic and length of treatment depend on your symptoms and the type of bacteria causing your infection. HOME CARE INSTRUCTIONS  If you were prescribed antibiotics, take them exactly as your caregiver instructs you. Finish the medication even if you feel better after you have only taken some of the medication.  Drink enough water and fluids to keep your urine clear or pale yellow.  Avoid caffeine, tea, and carbonated beverages. They tend to irritate your bladder.  Empty your bladder often. Avoid holding urine for long periods of time.  Empty your bladder before and after sexual intercourse.  After a bowel movement, women should cleanse from front to back. Use each tissue only once. SEEK MEDICAL CARE IF:   You have back pain.  You develop a fever.  Your symptoms do not begin to resolve within 3 days. SEEK IMMEDIATE MEDICAL CARE IF:   You have severe back pain or lower abdominal pain.  You develop chills.  You have nausea or vomiting.  You have continued burning or discomfort with urination. MAKE SURE YOU:   Understand these instructions.  Will watch your condition.  Will get help right away if you are not doing well or get worse. Document Released: 03/09/2005 Document Revised: 11/29/2011 Document Reviewed: 07/08/2011 [facility] Patient Information 2013 [place], [place]. Weakness Weakness is a lack of strength. You may feel weak all over your body or just in one part of your body. Weakness can be serious. In some cases, you may need more medical tests. HOME CARE  Rest.  Eat a well-balanced diet.  Try to exercise every day.  Only take medicines as told by your doctor. GET HELP RIGHT AWAY IF:    You cannot do your normal daily activities.  You cannot walk up and down stairs, or you feel very tired when you do so.  You have shortness of breath or chest pain.  You have trouble moving parts of your body.  You have weakness in only one body part or on only one side of the body.  You have a fever.  You have trouble speaking or swallowing.  You cannot control when you pee (urinate) or poop (bowel movement).  You have black or bloody throw up (vomit) or poop.  Your weakness gets worse or spreads to other body parts.  You have new aches or pains. MAKE SURE YOU:   Understand these instructions.  Will watch your condition.  Will get help right away if you are not doing well or get worse. Document Released: 05/12/2008 Document Revised: 11/29/2011 Document Reviewed: 07/29/2011 [facility] Patient Information 2013 [place], [place].

## 2012-06-03 NOTE — ED Notes (Signed)
States she feels better and is ready to go home

## 2012-06-03 NOTE — ED Provider Notes (Signed)
History     CSN: [id number]  Arrival date & time 06/02/12  2006   First MD Initiated Contact with Patient 06/02/12 2300      Chief Complaint  Patient presents with  . Chest Pain  . Fall  . Shortness of Breath    (Consider location/radiation/quality/duration/timing/severity/associated sxs/prior treatment) HPI 61 year old female presents emergency apartment with complaint of generalized weakness, one fall that resulted in no injury, and overall "feeling bad". Patient reports some mild pressure on her chest and shortness of breath upon arrival to the ER. Other symptoms have been ongoing for last several days. Patient recently admitted due to diabetic ulcer and hypertension. After discharge she had several days of diarrhea where she was not eating and or taking her blood pressure medicines. She has since restarted eating and taking her medications, was placed on Flagyl and clindamycin by her primary care doctor to help with diarrhea. No further diarrhea. She reports her blood pressure normally was running in the 180 systolic, and now is in the 115. She denies any diaphoresis, no nausea. She has history of hypertension, diabetes. No previous known coronary disease. Past Medical History  Diagnosis Date  . Hypertension   . Diabetes mellitus without complication     diagnosed around 2010, only ever on metformin  . Chronic pain     neck pain, headache, neuropathy  . Hypertriglyceridemia   . Neuromuscular disorder     Past Surgical History  Procedure Date  . Rotator cuff repair   . Carpal tunnel release   . Abdominal hysterectomy   . Cholecystectomy     No family history on file.  History  Substance Use Topics  . Smoking status: Current Every Day Smoker -- 0.5 packs/day for 41 years    Types: Cigarettes  . Smokeless tobacco: Not on file     Comment: not interested in trying to stop now - husband just died in 01-01-23  . Alcohol Use: No    OB History    Grav Para Term Preterm  Abortions TAB SAB Ect Mult Living                  Review of Systems  See History of Present Illness; otherwise all other systems are reviewed and negative  Allergies  Soma  Home Medications   Current Outpatient Rx  Name  Route  Sig  Dispense  Refill  . BUTALBITAL-APAP-CAFFEINE 50-325-40 MG PO TABS   Oral   Take 1 tablet by mouth 2 (two) times daily as needed. Stress headache         . CYCLOBENZAPRINE HCL 10 MG PO TABS   Oral   Take 1 tablet (10 mg total) by mouth 3 (three) times daily as needed. For muscle spasms   90 tablet   1   . DULOXETINE HCL 30 MG PO CPEP   Oral   Take 1 capsule (30 mg total) by mouth daily. 30 mg in addition to 60 mg to equal 90 mg   30 capsule   1   . DULOXETINE HCL 60 MG PO CPEP   Oral   Take 1 capsule (60 mg total) by mouth daily. 60 mg in addition to 30 mg to equal 90 mg   30 capsule   1   . GABAPENTIN 600 MG PO TABS   Oral   Take 1 tablet (600 mg total) by mouth 3 (three) times daily. 3qam 2 noon 3qhs   90 tablet   5   .  HYDROCODONE-ACETAMINOPHEN 5-325 MG PO TABS   Oral   Take 1 tablet by mouth every 6 (six) hours as needed. For pain         . LISINOPRIL 20 MG PO TABS   Oral   Take 1 tablet (20 mg total) by mouth daily.   30 tablet   5   . METFORMIN HCL 500 MG PO TABS   Oral   Take 2 tablets (1,000 mg total) by mouth 2 (two) times daily with a meal.   120 tablet   5   . METOPROLOL TARTRATE 50 MG PO TABS   Oral   Take 1 tablet (50 mg total) by mouth 2 (two) times daily.   60 tablet   11   . METRONIDAZOLE 500 MG PO TABS   Oral   Take 1 tablet (500 mg total) by mouth 3 (three) times daily.   42 tablet   0   . NIACIN ER 500 MG PO TBCR   Oral   Take 500 mg by mouth 2 (two) times daily with a meal.           BP 105/68  Pulse 89  Temp 98.5 F (36.9 C) (Oral)  Resp 19  SpO2 99%  Physical Exam  Nursing note and vitals reviewed. Constitutional: She is oriented to person, place, and time. She appears  well-developed and well-nourished.  HENT:  Head: Normocephalic and atraumatic.  Right Ear: External ear normal.  Left Ear: External ear normal.  Nose: Nose normal.  Mouth/Throat: Oropharynx is clear and moist.  Eyes: Conjunctivae normal and EOM are normal. Pupils are equal, round, and reactive to light.  Neck: Normal range of motion. Neck supple. No JVD present. No tracheal deviation present. No thyromegaly present.  Cardiovascular: Normal rate, regular rhythm, normal heart sounds and intact distal pulses.  Exam reveals no gallop and no friction rub.   No murmur heard. Pulmonary/Chest: Effort normal and breath sounds normal. No stridor. No respiratory distress. She has no wheezes. She has no rales. She exhibits no tenderness.  Abdominal: Soft. She exhibits no distension and no mass. There is no tenderness. There is no rebound and no guarding.       Hyperactive bowel sounds  Musculoskeletal: Normal range of motion. She exhibits no edema and no tenderness.  Lymphadenopathy:    She has no cervical adenopathy.  Neurological: She is alert and oriented to person, place, and time. No cranial nerve deficit. She exhibits normal muscle tone. Coordination normal.  Skin: Skin is warm and dry. No rash noted. No erythema. No pallor.  Psychiatric: She has a normal mood and affect. Her behavior is normal. Judgment and thought content normal.    ED Course  Procedures (including critical care time)  Labs Reviewed  BASIC METABOLIC PANEL - Abnormal; Notable for the following:    Glucose, Bld 180 (*)     GFR calc non Af Amer 65 (*)     GFR calc Af Amer 75 (*)     All other components within normal limits  URINALYSIS, ROUTINE W REFLEX MICROSCOPIC - Abnormal; Notable for the following:    APPearance CLOUDY (*)     Leukocytes, UA LARGE (*)     All other components within normal limits  URINE MICROSCOPIC-ADD ON - Abnormal; Notable for the following:    Squamous Epithelial / LPF FEW (*)     Bacteria, UA  FEW (*)     Casts HYALINE CASTS (*)     All other  components within normal limits  CBC  POCT I-STAT TROPONIN I  TROPONIN I  URINE CULTURE   Dg Chest Port 1 View  06/02/2012  *RADIOLOGY REPORT*  Clinical Data: Chest pain, shortness of breath.  PORTABLE CHEST - 1 VIEW  Comparison: 05/31/2012 CT  Findings: Cardiomediastinal contours within normal range.  Lungs predominately clear.  No pleural effusion or pneumothorax.  Left humeral head tendon anchors.  No acute osseous finding.  IMPRESSION: No radiographic evidence of acute cardiopulmonary process.   Original Report Authenticated By: Jearld Lesch, M.D.      1. Urinary tract infection   2. Weakness       MDM  Found to have urinary tract infection which may be causing her generalized weakness. Patient also previously significantly hypertensive and now any normotensive range, may be adjusting to this new normal blood pressure. She has close followup on Friday with her primary care Dr. Maryclare Labrador place her on Bactrim for the next 5 days and have her repeat a UA at her next PCP visit        Olivia Mackie, MD 06/03/12 [phone]

## 2012-06-04 LAB — URINE CULTURE: Colony Count: 9000

## 2012-06-08 ENCOUNTER — Ambulatory Visit: Payer: Self-pay | Admitting: Internal Medicine

## 2012-06-25 ENCOUNTER — Encounter: Payer: BC Managed Care – PPO | Admitting: Internal Medicine

## 2012-07-09 ENCOUNTER — Encounter: Payer: BC Managed Care – PPO | Admitting: Internal Medicine

## 2012-08-11 ENCOUNTER — Encounter (HOSPITAL_COMMUNITY): Payer: Self-pay

## 2012-08-11 ENCOUNTER — Inpatient Hospital Stay (HOSPITAL_COMMUNITY)
Admission: EM | Admit: 2012-08-11 | Discharge: 2012-08-14 | DRG: 065 | Disposition: A | Discharge status: Home / Self Care | Payer: BC Managed Care – PPO | Attending: Internal Medicine | Admitting: Internal Medicine

## 2012-08-11 ENCOUNTER — Emergency Department (HOSPITAL_COMMUNITY): Payer: BC Managed Care – PPO

## 2012-08-11 DIAGNOSIS — I5032 Chronic diastolic (congestive) heart failure: Secondary | ICD-10-CM

## 2012-08-11 DIAGNOSIS — E118 Type 2 diabetes mellitus with unspecified complications: Secondary | ICD-10-CM | POA: Diagnosis present

## 2012-08-11 DIAGNOSIS — Z888 Allergy status to other drugs, medicaments and biological substances status: Secondary | ICD-10-CM

## 2012-08-11 DIAGNOSIS — G589 Mononeuropathy, unspecified: Secondary | ICD-10-CM | POA: Diagnosis present

## 2012-08-11 DIAGNOSIS — Z9089 Acquired absence of other organs: Secondary | ICD-10-CM

## 2012-08-11 DIAGNOSIS — E781 Pure hyperglyceridemia: Secondary | ICD-10-CM

## 2012-08-11 DIAGNOSIS — R9431 Abnormal electrocardiogram [ECG] [EKG]: Secondary | ICD-10-CM

## 2012-08-11 DIAGNOSIS — E1140 Type 2 diabetes mellitus with diabetic neuropathy, unspecified: Secondary | ICD-10-CM

## 2012-08-11 DIAGNOSIS — G709 Myoneural disorder, unspecified: Secondary | ICD-10-CM | POA: Diagnosis present

## 2012-08-11 DIAGNOSIS — R0600 Dyspnea, unspecified: Secondary | ICD-10-CM

## 2012-08-11 DIAGNOSIS — E1139 Type 2 diabetes mellitus with other diabetic ophthalmic complication: Secondary | ICD-10-CM | POA: Diagnosis present

## 2012-08-11 DIAGNOSIS — R000 Tachycardia, unspecified: Secondary | ICD-10-CM | POA: Diagnosis present

## 2012-08-11 DIAGNOSIS — G8929 Other chronic pain: Secondary | ICD-10-CM | POA: Diagnosis present

## 2012-08-11 DIAGNOSIS — G43909 Migraine, unspecified, not intractable, without status migrainosus: Secondary | ICD-10-CM | POA: Diagnosis present

## 2012-08-11 DIAGNOSIS — F17200 Nicotine dependence, unspecified, uncomplicated: Secondary | ICD-10-CM | POA: Diagnosis present

## 2012-08-11 DIAGNOSIS — I10 Essential (primary) hypertension: Secondary | ICD-10-CM | POA: Diagnosis present

## 2012-08-11 DIAGNOSIS — R0602 Shortness of breath: Secondary | ICD-10-CM | POA: Diagnosis present

## 2012-08-11 DIAGNOSIS — M542 Cervicalgia: Secondary | ICD-10-CM | POA: Diagnosis present

## 2012-08-11 DIAGNOSIS — H532 Diplopia: Secondary | ICD-10-CM | POA: Diagnosis present

## 2012-08-11 DIAGNOSIS — R42 Dizziness and giddiness: Principal | ICD-10-CM | POA: Diagnosis present

## 2012-08-11 DIAGNOSIS — Z79899 Other long term (current) drug therapy: Secondary | ICD-10-CM

## 2012-08-11 DIAGNOSIS — J4489 Other specified chronic obstructive pulmonary disease: Secondary | ICD-10-CM | POA: Diagnosis present

## 2012-08-11 DIAGNOSIS — J449 Chronic obstructive pulmonary disease, unspecified: Secondary | ICD-10-CM | POA: Diagnosis present

## 2012-08-11 DIAGNOSIS — Z90710 Acquired absence of both cervix and uterus: Secondary | ICD-10-CM

## 2012-08-11 DIAGNOSIS — I509 Heart failure, unspecified: Secondary | ICD-10-CM | POA: Diagnosis present

## 2012-08-11 HISTORY — DX: Heart failure, unspecified: I50.9

## 2012-08-11 HISTORY — DX: Chronic obstructive pulmonary disease, unspecified: J44.9

## 2012-08-11 LAB — COMPREHENSIVE METABOLIC PANEL
ALT: 21 U/L (ref 0–35)
Alkaline Phosphatase: 49 U/L (ref 39–117)
CO2: 23 mEq/L (ref 19–32)
Calcium: 9.3 mg/dL (ref 8.4–10.5)
GFR calc Af Amer: >90 mL/min (ref >90)
GFR calc non Af Amer: 90 mL/min — ABNORMAL LOW (ref >90)
Glucose, Bld: 236 mg/dL — ABNORMAL HIGH (ref 70–99)
Sodium: 134 mEq/L — ABNORMAL LOW (ref 135–145)

## 2012-08-11 LAB — CBC
Hemoglobin: 13.6 g/dL (ref 12.0–15.0)
MCV: 95.4 fL (ref 78.0–100.0)
Platelets: 187 10*3/uL (ref 150–400)
RBC: 4.32 MIL/uL (ref 3.87–5.11)
WBC: 6.8 10*3/uL (ref 4.0–10.5)

## 2012-08-11 LAB — POCT I-STAT, CHEM 8
Chloride: 108 mEq/L (ref 96–112)
Creatinine, Ser: 0.8 mg/dL (ref 0.50–1.10)
HCT: 41 % (ref 36.0–46.0)
Hemoglobin: 13.9 g/dL (ref 12.0–15.0)
Potassium: 3.9 mEq/L (ref 3.5–5.1)
Sodium: 140 mEq/L (ref 135–145)

## 2012-08-11 LAB — RAPID URINE DRUG SCREEN, HOSP PERFORMED
Benzodiazepines: NOT DETECTED
Opiates: NOT DETECTED

## 2012-08-11 LAB — POCT I-STAT TROPONIN I

## 2012-08-11 MED ORDER — HEPARIN SODIUM (PORCINE) 5000 UNIT/ML IJ SOLN
5000.0000 [IU] | Freq: Three times a day (TID) | INTRAMUSCULAR | Status: DC
  Administered 2012-08-11 – 2012-08-14 (×8): 5000 [IU] via SUBCUTANEOUS
  Filled 2012-08-11 (×11): qty 1

## 2012-08-11 MED ORDER — NIACIN ER 500 MG PO TBCR: 500.0000 mg | EXTENDED_RELEASE_TABLET | Freq: Two times a day (BID) | ORAL | Status: DC

## 2012-08-11 MED ORDER — LISINOPRIL 20 MG PO TABS
20.0000 mg | ORAL_TABLET | Freq: Every day | ORAL | Status: DC
  Administered 2012-08-12 – 2012-08-14 (×3): 20 mg via ORAL
  Filled 2012-08-11 (×3): qty 1

## 2012-08-11 MED ORDER — SODIUM CHLORIDE 0.9 % IJ SOLN
3.0000 mL | Freq: Two times a day (BID) | INTRAMUSCULAR | Status: DC
  Administered 2012-08-11 – 2012-08-14 (×6): 3 mL via INTRAVENOUS

## 2012-08-11 MED ORDER — ASPIRIN EC 81 MG PO TBEC
81.0000 mg | DELAYED_RELEASE_TABLET | Freq: Every day | ORAL | Status: DC
  Administered 2012-08-11 – 2012-08-14 (×4): 81 mg via ORAL
  Filled 2012-08-11 (×4): qty 1

## 2012-08-11 MED ORDER — DULOXETINE HCL 60 MG PO CPEP
60.0000 mg | ORAL_CAPSULE | Freq: Every day | ORAL | Status: DC
  Administered 2012-08-11 – 2012-08-14 (×4): 60 mg via ORAL
  Filled 2012-08-11 (×4): qty 1

## 2012-08-11 MED ORDER — GABAPENTIN 600 MG PO TABS
600.0000 mg | ORAL_TABLET | Freq: Three times a day (TID) | ORAL | Status: DC
  Administered 2012-08-11 – 2012-08-14 (×8): 600 mg via ORAL
  Filled 2012-08-11 (×11): qty 1

## 2012-08-11 MED ORDER — METOPROLOL TARTRATE 50 MG PO TABS
50.0000 mg | ORAL_TABLET | Freq: Two times a day (BID) | ORAL | Status: DC
  Administered 2012-08-11 – 2012-08-14 (×6): 50 mg via ORAL
  Filled 2012-08-11 (×7): qty 1

## 2012-08-11 MED ORDER — NIACIN ER 500 MG PO CPCR
500.0000 mg | ORAL_CAPSULE | Freq: Two times a day (BID) | ORAL | Status: DC
  Administered 2012-08-12 – 2012-08-14 (×5): 500 mg via ORAL
  Filled 2012-08-11 (×7): qty 1

## 2012-08-11 MED ORDER — INSULIN ASPART 100 UNIT/ML ~~LOC~~ SOLN
0.0000 [IU] | Freq: Three times a day (TID) | SUBCUTANEOUS | Status: DC
  Administered 2012-08-12: 3 [IU] via SUBCUTANEOUS
  Administered 2012-08-12: 2 [IU] via SUBCUTANEOUS
  Administered 2012-08-13 (×2): 1 [IU] via SUBCUTANEOUS
  Administered 2012-08-13: 2 [IU] via SUBCUTANEOUS
  Administered 2012-08-14: 3 [IU] via SUBCUTANEOUS
  Administered 2012-08-14: 2 [IU] via SUBCUTANEOUS

## 2012-08-11 MED ORDER — MECLIZINE HCL 12.5 MG PO TABS
12.5000 mg | ORAL_TABLET | Freq: Two times a day (BID) | ORAL | Status: DC
  Administered 2012-08-11 – 2012-08-14 (×6): 12.5 mg via ORAL
  Filled 2012-08-11 (×7): qty 1

## 2012-08-11 NOTE — H&P (Signed)
Hospital Admission Note Date: 08/11/2012  Patient name: [name] Medical record number: [id number] Date of birth: 22-Sep-1950 Age: 62 y.o. Gender: female PCP: Kristie Cowman, MD  Medical Service: Internal Medicine Teaching Service  Attending physician:  Dr. Criselda Peaches    1st Contact:  Dr. Garald Braver   Pager:[phone] 2nd Contact:  Dr. Clyde Lundborg    Pager:[phone] After 5 pm or weekends: 1st Contact:      Pager: [phone] 2nd Contact:      Pager: [phone]  Chief Complaint: Dizziness and shortness of breath  History of Present Illness: [name] is a 62 year old woman with PMH of DM2, HTN, and chronic tachycardia who comes in to the [facility] ED for evaluation of dizziness and shortness of breath. She states that she was short of breath last night and this morning but the shortness of breath has completely subsided now. She has nasal congestion that has effected her breathing but she denies rhinorrhea, cough, fever/chills, sinus pain, or sinus pressure. Her dizziness started last night with the sensation of the room spinning. The dizziness comes and goes and is more pronounced with standing. She denies ringing in her ears, nausea, vomiting, decreased intake per mouth, or falls. She does not recall having this symptoms before.  She was seen at there PCP in December for diarrhea which was treated for presumed C. Diff colitis with Flagyl. She finished Flagyl last month and states that the diarrhea has completely subsided now.  She also reports having diplopia for months now. She reports being told by her eye doctor to change her eyeglasses to prevent diplopia.   She denies prolonged car rides, recent medication changes, recent alcohol use, chest pain, changes in her hearing, abdominal pain, N/V, diarrhea, leg edema, calf pain, history of blood clots, weakness, changes in speech, or confusion.   Meds: Current Outpatient Rx  Name  Route  Sig  Dispense  Refill  . cyclobenzaprine (FLEXERIL) 10 MG tablet   Oral    Take 1 tablet (10 mg total) by mouth 3 (three) times daily as needed. For muscle spasms   90 tablet   1   . gabapentin (NEURONTIN) 600 MG tablet   Oral   Take 1 tablet (600 mg total) by mouth 3 (three) times daily. 3qam 2 noon 3qhs   90 tablet   5   . lisinopril (PRINIVIL,ZESTRIL) 20 MG tablet   Oral   Take 1 tablet (20 mg total) by mouth daily.   30 tablet   5   . metoprolol (LOPRESSOR) 50 MG tablet   Oral   Take 1 tablet (50 mg total) by mouth 2 (two) times daily.   60 tablet   11   . niacin (SLO-NIACIN) 500 MG tablet   Oral   Take 500 mg by mouth 2 (two) times daily with a meal.         . butalbital-acetaminophen-caffeine (FIORICET, ESGIC) 50-325-40 MG per tablet   Oral   Take 1 tablet by mouth 2 (two) times daily as needed. Stress headache         . DULoxetine (CYMBALTA) 30 MG capsule   Oral   Take 1 capsule (30 mg total) by mouth daily. 30 mg in addition to 60 mg to equal 90 mg   30 capsule   1   . DULoxetine (CYMBALTA) 60 MG capsule   Oral   Take 1 capsule (60 mg total) by mouth daily. 60 mg in addition to 30 mg to equal 90 mg  30 capsule   1   . HYDROcodone-acetaminophen (NORCO/VICODIN) 5-325 MG per tablet   Oral   Take 1 tablet by mouth every 6 (six) hours as needed. For pain           Allergies: Allergies as of 08/11/2012 - Review Complete 08/11/2012  Allergen Reaction Noted  . Soma (carisoprodol) Itching and Rash 05/13/2012   Past Medical History  Diagnosis Date  . Hypertension   . Diabetes mellitus without complication     diagnosed around 2010, only ever on metformin  . Chronic pain     neck pain, headache, neuropathy  . Hypertriglyceridemia   . Neuromuscular disorder   . COPD (chronic obstructive pulmonary disease)   . CHF (congestive heart failure)    Past Surgical History  Procedure Laterality Date  . Rotator cuff repair    . Carpal tunnel release    . Abdominal hysterectomy    . Cholecystectomy     History reviewed. No  pertinent family history. History   Social History  . Marital Status: Widowed    Spouse Name: N/A    Number of Children: N/A  . Years of Education: N/A   Occupational History  . Not on file.   Social History Main Topics  . Smoking status: Current Every Day Smoker -- 0.50 packs/day for 41 years    Types: Cigarettes  . Smokeless tobacco: Not on file     Comment: not interested in trying to stop now - husband just died in 01/07/23  . Alcohol Use: No  . Drug Use: No  . Sexually Active: Not on file     Comment: not asked   Other Topics Concern  . Not on file   Social History Narrative   Moved to [facility] from [place] November 27, 2013shortly after the death of her husband, to live with her daughter.  She notes significant life stress related to her 40 year-old grand-daughter with bipolar disorder.  She has previously worked as a Associate Professor.    Review of Systems: Pertinent items are noted in HPI.  Physical Exam: Blood pressure 142/80, pulse 136, temperature 98.6 F (37 C), temperature source Oral, resp. rate 20, height 5\' 6"  (1.676 m), SpO2 97.00%. Vitals reviewed. General: resting in bed, in NAD HEENT: PERRL, EOMI, no scleral icterus. Visual fields intact. TM partially blocked by cerumen but with no bulging or erythema.  Cardiac: RRR, no rubs, murmurs or gallops Pulm: clear to auscultation bilaterally, no wheezes, rales, or rhonchi Abd: soft, nontender, nondistended, BS present Ext: warm and well perfused, no pedal edema Neuro: alert and oriented X3, cranial nerves II-XII grossly intact, strength and sensation to light touch equal in bilateral upper and lower extremities. Finger-to-nose test normal, no Romberg    Lab results: Basic Metabolic Panel:  Recent Labs  16/10/96 1441  NA 140  K 3.9  CL 108  GLUCOSE 229*  BUN 16  CREATININE 0.80   CBC:  Recent Labs  08/11/12 1429 08/11/12 1441  WBC 6.8  --   HGB 13.6 13.9  HCT 41.2 41.0  MCV 95.4  --   PLT 187  --     BNP:  Recent Labs  08/11/12 1431  PROBNP 258.0*   D-Dimer:  Recent Labs  08/11/12 1431  DDIMER 0.61*     Imaging results:  Dg Chest Port 1 View  08/11/2012  *RADIOLOGY REPORT*  Clinical Data: Shortness of breath, dizziness  PORTABLE CHEST - 1 VIEW  Comparison: 06/02/2012  Findings: Lungs are  clear.  No pleural effusion or pneumothorax.  Cardiomediastinal silhouette is within normal limits.  IMPRESSION: No evidence of acute cardiopulmonary disease.   Original Report Authenticated By: Charline Bills, M.D.     Other results: EKG: tachycardia with short PR interval, rate of 132, T wave inversion in V3-V5 and leads I, II, and III.   Assessment & Plan by Problem:  Dizziness. Etiology unclear but to include vertigo (history of the room spinning), ACS with atypical presentation (although no chest pain and negative troponin, T wave inversion in inferolateral leads), PE (recetn CTA negative for PE, she denies calf pain, although D-dimer is elevated), arrythmia (she has tachycardia with prolonged Qtc), orthostatic hypotension (although she does not have orthostatic hypotension on physical exam and denies diarrhea, vomiting, or decreased intake per mouth).  -Observation with telemetry -2D echo, carotid doppler -troponin q6h x3 -EKG in AM -meclizine -UDS  Prolonged QTc.  EKG today with QTc of 571. Previous EKG with QTc of 467. This is concerning for effects of flexeril with possible concomitant Cymbalta use.  -Discontinued flexeril -Decreased Cymbalta to 60mg  daily -Telemetry -Repeat EKG in AM  Hypertension. BP well controlled today. No orthostatic hypotension on physical exam check. No signs of dehydration on physical exam.  -Continue lisinopril and metoprolol  Tachycardia. Chronic. Etiology unclear but could be secondary to frequent and prolonged use of flexeril.  -Discontinued flexeril -Telemetry -repeat EKG in AM  DM2. Hgb A1C on 05/13/12 at 8.3. She is on metformin 1000mg   BID at home.  -Hold metformin while inpatient -SSI  Diplopia. She reports having double vision for months now. She denies history of migraines, stroke, or corneal injury. She reports being evaluated by and ophthalmologist recently and told that she needed to change her prescription glasses. Differential to include intracranial (less likely given lack of migraine but possible), EOM dysfunction (but normal on exam except for minimum horizontal nystagmus), cornea disease (no corneal scaring but she was told to change her prescription glasses), or uncontrolled DM2.  -She will need outpatient follow up with ophthalmology exam and possibly Neurology referral if no ophthalmological disorders is identified.   VTE prophylaxis. Heparin TID  FEN NSL Electrolyte repletion as needed Carb mod diet    Dispo: Disposition is deferred at this time, awaiting improvement of current medical problems. Anticipated discharge in approximately 1-2  day(s).   The patient does have a current PCP (SCHOOLER, KAREN, MD), therefore will be requiring OPC follow-up after discharge.   The patient does not have transportation limitations that hinder transportation to clinic appointments.  SignedKy Barban 08/11/2012, 6:48 PM

## 2012-08-11 NOTE — ED Provider Notes (Addendum)
History     CSN: [id number]  Arrival date & time 08/11/12  1348   First MD Initiated Contact with Patient 08/11/12 1412      Chief Complaint  Patient presents with  . Shortness of Breath  . Dizziness    (Consider location/radiation/quality/duration/timing/severity/associated sxs/prior treatment) HPI Comments: 62 year old female history of diabetes, hypertension, possible history of congestive heart failure though the patient is uncertain. She presents with acute onset of shortness of breath it started yesterday, has been persistent, not associated with coughing, fevers, back pain, swelling, trauma, travel, immobilization. She still smokes several cigarettes a day. She denies chest pain at this time. According to the medical record the patient was evaluated with a CT angiogram of her chest and Doppler studies of her legs in the past 4 months this did not show any signs of DVT or PE. The symptoms are persistent, nothing seems to make it better or worse. She also admits to feeling severely dehydrated  Patient is a 62 y.o. female presenting with shortness of breath. The history is provided by the patient and medical records.  Shortness of Breath   Past Medical History  Diagnosis Date  . Hypertension   . Diabetes mellitus without complication     diagnosed around 2010, only ever on metformin  . Chronic pain     neck pain, headache, neuropathy  . Hypertriglyceridemia   . Neuromuscular disorder   . COPD (chronic obstructive pulmonary disease)   . CHF (congestive heart failure)     Past Surgical History  Procedure Laterality Date  . Rotator cuff repair    . Carpal tunnel release    . Abdominal hysterectomy    . Cholecystectomy      History reviewed. No pertinent family history.  History  Substance Use Topics  . Smoking status: Current Every Day Smoker -- 0.50 packs/day for 41 years    Types: Cigarettes  . Smokeless tobacco: Not on file     Comment: not interested in trying to  stop now - husband just died in Jan 08, 2023  . Alcohol Use: No    OB History   Grav Para Term Preterm Abortions TAB SAB Ect Mult Living                  Review of Systems  Respiratory: Positive for shortness of breath.   All other systems reviewed and are negative.    Allergies  Soma  Home Medications   Current Outpatient Rx  Name  Route  Sig  Dispense  Refill  . cyclobenzaprine (FLEXERIL) 10 MG tablet   Oral   Take 1 tablet (10 mg total) by mouth 3 (three) times daily as needed. For muscle spasms   90 tablet   1   . gabapentin (NEURONTIN) 600 MG tablet   Oral   Take 1 tablet (600 mg total) by mouth 3 (three) times daily. 3qam 2 noon 3qhs   90 tablet   5   . lisinopril (PRINIVIL,ZESTRIL) 20 MG tablet   Oral   Take 1 tablet (20 mg total) by mouth daily.   30 tablet   5   . metFORMIN (GLUCOPHAGE) 500 MG tablet   Oral   Take 2 tablets (1,000 mg total) by mouth 2 (two) times daily with a meal.   120 tablet   5   . metoprolol (LOPRESSOR) 50 MG tablet   Oral   Take 1 tablet (50 mg total) by mouth 2 (two) times daily.   60 tablet  11   . niacin (SLO-NIACIN) 500 MG tablet   Oral   Take 500 mg by mouth 2 (two) times daily with a meal.         . butalbital-acetaminophen-caffeine (FIORICET, ESGIC) 50-325-40 MG per tablet   Oral   Take 1 tablet by mouth 2 (two) times daily as needed. Stress headache         . DULoxetine (CYMBALTA) 30 MG capsule   Oral   Take 1 capsule (30 mg total) by mouth daily. 30 mg in addition to 60 mg to equal 90 mg   30 capsule   1   . DULoxetine (CYMBALTA) 60 MG capsule   Oral   Take 1 capsule (60 mg total) by mouth daily. 60 mg in addition to 30 mg to equal 90 mg   30 capsule   1   . HYDROcodone-acetaminophen (NORCO/VICODIN) 5-325 MG per tablet   Oral   Take 1 tablet by mouth every 6 (six) hours as needed. For pain           BP 129/92  Pulse 136  Temp(Src) 98.6 F (37 C) (Oral)  Resp 18  Ht 5\' 6"  (1.676 m)  SpO2  96%  Physical Exam  Nursing note and vitals reviewed. Constitutional: She appears well-developed and well-nourished. She appears distressed.  HENT:  Head: Normocephalic and atraumatic.  Mouth/Throat: No oropharyngeal exudate.  Mucous membranes significantly dehydrated  Eyes: Conjunctivae and EOM are normal. Pupils are equal, round, and reactive to light. Right eye exhibits no discharge. Left eye exhibits no discharge. No scleral icterus.  Neck: Normal range of motion. Neck supple. No JVD present. No thyromegaly present.  Cardiovascular: Regular rhythm, normal heart sounds and intact distal pulses.  Exam reveals no gallop and no friction rub.   No murmur heard. Tachycardia, no obvious murmur  Pulmonary/Chest: Effort normal and breath sounds normal. No respiratory distress. She has no wheezes. She has no rales.  Abdominal: Soft. Bowel sounds are normal. She exhibits no distension and no mass. There is no tenderness.  Musculoskeletal: Normal range of motion. She exhibits no edema and no tenderness.  Lymphadenopathy:    She has no cervical adenopathy.  Neurological: She is alert. Coordination normal.  Skin: Skin is warm and dry. No rash noted. No erythema.  Psychiatric: She has a normal mood and affect. Her behavior is normal.    ED Course  Procedures (including critical care time)  Labs Reviewed  PRO B NATRIURETIC PEPTIDE - Abnormal; Notable for the following:    Pro B Natriuretic peptide (BNP) 258.0 (*)    All other components within normal limits  D-DIMER, QUANTITATIVE - Abnormal; Notable for the following:    D-Dimer, Quant 0.61 (*)    All other components within normal limits  POCT I-STAT, CHEM 8 - Abnormal; Notable for the following:    Glucose, Bld 229 (*)    All other components within normal limits  CBC  POCT I-STAT TROPONIN I   Dg Chest Port 1 View  08/11/2012  *RADIOLOGY REPORT*  Clinical Data: Shortness of breath, dizziness  PORTABLE CHEST - 1 VIEW  Comparison:  06/02/2012  Findings: Lungs are clear.  No pleural effusion or pneumothorax.  Cardiomediastinal silhouette is within normal limits.  IMPRESSION: No evidence of acute cardiopulmonary disease.   Original Report Authenticated By: Charline Bills, M.D.      1. Dyspnea       MDM  EKG has nonspecific ST and T wave abnormalities, no ST elevations, blood  pressure has reduced the patient does state that she was severely hypertensive yesterday. She has clear lung sounds, x-ray, labs, rule out pneumothorax, acute coronary syndrome. Unlikely to be pulmonary and wasn't a d-dimer ordered because of acute onset shortness of breath with clear lungs. Ulcer to be related to dehydration.  ED ECG REPORT  I personally interpreted this EKG   Date: 08/11/2012   Rate: 132  Rhythm: sinus tachycardia  QRS Axis: normal  Intervals: QT prolonged  ST/T Wave abnormalities: nonspecific ST/T changes  Conduction Disutrbances:none  Narrative Interpretation:   Old EKG Reviewed: Compared with 06/02/2012, rate is faster, ST segment and T-wave abnormalities continuing to be   Pt has no obvious findings on the CXR and no lab abnormlaties, be admitted to the family practice service.  PA and lateral views of the chest were obtained by digital radiography. I have personally interpreted these x-rays and find her to be no signs of pulmonary infiltrate, cardiomegaly, subdiaphragmatic free air, soft tissue abnormality, no obvious bony abnormalities or fractures.  D-dimer barely elevated, BNP 250.   D/w [facility] resident who will admit  Vida Roller, MD 08/11/12 1533  Vida Roller, MD 08/11/12 1600

## 2012-08-11 NOTE — ED Notes (Signed)
Pt c/o sob and dizziness. Denies CP. Symptoms started yesterday. Pt has hx of copd, chf.

## 2012-08-12 ENCOUNTER — Observation Stay (HOSPITAL_COMMUNITY): Payer: BC Managed Care – PPO

## 2012-08-12 DIAGNOSIS — G43909 Migraine, unspecified, not intractable, without status migrainosus: Secondary | ICD-10-CM

## 2012-08-12 DIAGNOSIS — R42 Dizziness and giddiness: Principal | ICD-10-CM

## 2012-08-12 DIAGNOSIS — I10 Essential (primary) hypertension: Secondary | ICD-10-CM

## 2012-08-12 DIAGNOSIS — E119 Type 2 diabetes mellitus without complications: Secondary | ICD-10-CM

## 2012-08-12 DIAGNOSIS — R000 Tachycardia, unspecified: Secondary | ICD-10-CM

## 2012-08-12 DIAGNOSIS — H532 Diplopia: Secondary | ICD-10-CM

## 2012-08-12 LAB — COMPREHENSIVE METABOLIC PANEL
Albumin: 3.1 g/dL — ABNORMAL LOW (ref 3.5–5.2)
Alkaline Phosphatase: 55 U/L (ref 39–117)
BUN: 12 mg/dL (ref 6–23)
Calcium: 9.5 mg/dL (ref 8.4–10.5)
GFR calc Af Amer: >90 mL/min (ref >90)
Potassium: 4.1 mEq/L (ref 3.5–5.1)
Total Protein: 6.4 g/dL (ref 6.0–8.3)

## 2012-08-12 LAB — CBC
HCT: 39.1 % (ref 36.0–46.0)
Hemoglobin: 13.1 g/dL (ref 12.0–15.0)
MCV: 95.4 fL (ref 78.0–100.0)
RBC: 4.1 MIL/uL (ref 3.87–5.11)
WBC: 5.5 10*3/uL (ref 4.0–10.5)

## 2012-08-12 LAB — GLUCOSE, CAPILLARY
Glucose-Capillary: 180 mg/dL — ABNORMAL HIGH (ref 70–99)
Glucose-Capillary: 205 mg/dL — ABNORMAL HIGH (ref 70–99)
Glucose-Capillary: 104 mg/dL — ABNORMAL HIGH (ref 70–99)

## 2012-08-12 LAB — TROPONIN I: Troponin I: <0.3 ng/mL (ref <0.30)

## 2012-08-12 MED ORDER — GADOBENATE DIMEGLUMINE 529 MG/ML IV SOLN
20.0000 mL | Freq: Once | INTRAVENOUS | Status: AC | PRN
  Administered 2012-08-12: 20 mL via INTRAVENOUS

## 2012-08-12 MED ORDER — HYDROCODONE-ACETAMINOPHEN 5-325 MG PO TABS
1.0000 | ORAL_TABLET | Freq: Four times a day (QID) | ORAL | Status: DC | PRN
  Administered 2012-08-12 (×2): 2 via ORAL
  Administered 2012-08-13: 1 via ORAL
  Administered 2012-08-14: 2 via ORAL
  Filled 2012-08-12 (×2): qty 2
  Filled 2012-08-12: qty 1
  Filled 2012-08-12: qty 2

## 2012-08-12 NOTE — Progress Notes (Signed)
*  PRELIMINARY RESULTS* Vascular Ultrasound Carotid Duplex (Doppler) has been completed.  Preliminary findings: Bilateral:  No evidence of hemodynamically significant internal carotid artery stenosis.   Vertebral artery flow is antegrade.     Farrel Demark, RDMS, RVT  08/12/2012, 11:48 AM
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
## 2012-08-12 NOTE — H&P (Signed)
Internal Medicine Teaching Service Attending Note Date: 08/12/2012  Patient name: [name]  Medical record number: [id number]  Date of birth: October 05, 1950   CC: Dizziness and SOB  I have seen and evaluated [name] and discussed their care with the Residency Team.    [name] is a 61yo woman who presented to [facility] ED for dizziness and SOB.  She has a PMH of DM2, HTN and tachycardia.  She recently (November 2013) moved from [place] with her daughter, so her medical history is mostly self reported.  She also has a history of migraines and chronic neck pain.  She reports that her dizziness has been persistent for about 2 days and associated with SOB.  She reports the dizziness as "unsteadiness" on her feet.  She reports not feeling like the room is spinning.  She has had previous episodes of dizziness in the past that did have more of a room spinning feeling.  She also has double vision which has been persistent for about a couple of months, however, it has intermittently been happening for a while and her previous PCP told her it was due to her diabetes being uncontrolled.  She has chronic headaches and was experiencing a headache when I saw her.  She denied LOC, falls, seizure activity, recent illness, weakness, recent travel, change in speech, confusion, leg pain, history of clotting.   Of note, she recently became a patient in the [facility] and her medications were changed somewhat, specifically, Norco was not prescribed for her chronic neck pain.  She has been taking an increasing dose of flexeril in lieu of that medication.    Pertinent medications: Cymbalta, flexeril, gabapentin  Physical Exam: Blood pressure 144/85, pulse 73, temperature 97.5 F (36.4 C), temperature source Oral, resp. rate 18, height 5\' 6"  (1.676 m), weight 195 lb 8.8 oz (88.7 kg), SpO2 97.00%. General appearance: alert, cooperative and appears stated age Head: Normocephalic, without obvious abnormality,  atraumatic Eyes: Aberrent eye movement on the right - when asked to look up, left eye moves laterally.  Otherwise EOMI, anicteric sclerae Lungs: clear to auscultation bilaterally and no wheezing Heart: RR, NR, no murmur noted Abdomen: soft, NT, +BS Extremities: extremities normal, atraumatic, no cyanosis or edema Neurologic: Mental status: Alert, oriented, thought content appropriate Cranial nerves: II: visual field normal, III,IV,VI: extraocular muscles superior rectus abnormal on the right, otherwise EOMI normal, V: facial light touch sensation normal bilaterally Sensory: normal Motor: grossly normal Gait: abnormal heel to toe gait  Lab results: Results for orders placed during the hospital encounter of 08/11/12 (from the past 24 hour(s))  CBC     Status: None   Collection Time    08/11/12  2:29 PM      Result Value Range   WBC 6.8  4.0 - 10.5 K/uL   RBC 4.32  3.87 - 5.11 MIL/uL   Hemoglobin 13.6  12.0 - 15.0 g/dL   HCT 78.2  95.6 - 21.3 %   MCV 95.4  78.0 - 100.0 fL   MCH 31.5  26.0 - 34.0 pg   MCHC 33.0  30.0 - 36.0 g/dL   RDW 08.6  57.8 - 46.9 %   Platelets 187  150 - 400 K/uL  PRO B NATRIURETIC PEPTIDE     Status: Abnormal   Collection Time    08/11/12  2:31 PM      Result Value Range   Pro B Natriuretic peptide (BNP) 258.0 (*) 0 - 125 pg/mL  D-DIMER, QUANTITATIVE  Status: Abnormal   Collection Time    08/11/12  2:31 PM      Result Value Range   D-Dimer, Quant 0.61 (*) 0.00 - 0.48 ug/mL-FEU  POCT I-STAT TROPONIN I     Status: None   Collection Time    08/11/12  2:40 PM      Result Value Range   Troponin i, poc 0.01  0.00 - 0.08 ng/mL   Comment 3           POCT I-STAT, CHEM 8     Status: Abnormal   Collection Time    08/11/12  2:41 PM      Result Value Range   Sodium 140  135 - 145 mEq/L   Potassium 3.9  3.5 - 5.1 mEq/L   Chloride 108  96 - 112 mEq/L   BUN 16  6 - 23 mg/dL   Creatinine, Ser 2.44  0.50 - 1.10 mg/dL   Glucose, Bld 010 (*) 70 - 99 mg/dL    Calcium, Ion 2.72  5.36 - 1.23 mmol/L   TCO2 21  0 - 100 mmol/L   Hemoglobin 13.9  12.0 - 15.0 g/dL   HCT 64.4  03.4 - 74.2 %  URINE RAPID DRUG SCREEN (HOSP PERFORMED)     Status: None   Collection Time    08/11/12  8:31 PM      Result Value Range   Opiates NONE DETECTED  NONE DETECTED   Cocaine NONE DETECTED  NONE DETECTED   Benzodiazepines NONE DETECTED  NONE DETECTED   Amphetamines NONE DETECTED  NONE DETECTED   Tetrahydrocannabinol NONE DETECTED  NONE DETECTED   Barbiturates NONE DETECTED  NONE DETECTED  GLUCOSE, CAPILLARY     Status: Abnormal   Collection Time    08/11/12 10:06 PM      Result Value Range   Glucose-Capillary 222 (*) 70 - 99 mg/dL  COMPREHENSIVE METABOLIC PANEL     Status: Abnormal   Collection Time    08/11/12 10:09 PM      Result Value Range   Sodium 134 (*) 135 - 145 mEq/L   Potassium 4.4  3.5 - 5.1 mEq/L   Chloride 100  96 - 112 mEq/L   CO2 23  19 - 32 mEq/L   Glucose, Bld 236 (*) 70 - 99 mg/dL   BUN 13  6 - 23 mg/dL   Creatinine, Ser 5.95  0.50 - 1.10 mg/dL   Calcium 9.3  8.4 - 63.8 mg/dL   Total Protein 6.7  6.0 - 8.3 g/dL   Albumin 3.3 (*) 3.5 - 5.2 g/dL   AST 36  0 - 37 U/L   ALT 21  0 - 35 U/L   Alkaline Phosphatase 49  39 - 117 U/L   Total Bilirubin 0.3  0.3 - 1.2 mg/dL   GFR calc non Af Amer 90 (*) >90 mL/min   GFR calc Af Amer >90  >90 mL/min  TROPONIN I     Status: None   Collection Time    08/11/12 10:09 PM      Result Value Range   Troponin I <0.30  <0.30 ng/mL  COMPREHENSIVE METABOLIC PANEL     Status: Abnormal   Collection Time    08/12/12  5:30 AM      Result Value Range   Sodium 136  135 - 145 mEq/L   Potassium 4.1  3.5 - 5.1 mEq/L   Chloride 105  96 - 112 mEq/L   CO2 20  19 - 32 mEq/L   Glucose, Bld 190 (*) 70 - 99 mg/dL   BUN 12  6 - 23 mg/dL   Creatinine, Ser 1.61  0.50 - 1.10 mg/dL   Calcium 9.5  8.4 - 09.6 mg/dL   Total Protein 6.4  6.0 - 8.3 g/dL   Albumin 3.1 (*) 3.5 - 5.2 g/dL   AST 18  0 - 37 U/L   ALT 22  0 -  35 U/L   Alkaline Phosphatase 55  39 - 117 U/L   Total Bilirubin 0.3  0.3 - 1.2 mg/dL   GFR calc non Af Amer >90  >90 mL/min   GFR calc Af Amer >90  >90 mL/min  CBC     Status: Abnormal   Collection Time    08/12/12  5:30 AM      Result Value Range   WBC 5.5  4.0 - 10.5 K/uL   RBC 4.10  3.87 - 5.11 MIL/uL   Hemoglobin 13.1  12.0 - 15.0 g/dL   HCT 04.5  40.9 - 81.1 %   MCV 95.4  78.0 - 100.0 fL   MCH 32.0  26.0 - 34.0 pg   MCHC 33.5  30.0 - 36.0 g/dL   RDW 91.4  78.2 - 95.6 %   Platelets 149 (*) 150 - 400 K/uL  TROPONIN I     Status: None   Collection Time    08/12/12  5:30 AM      Result Value Range   Troponin I <0.30  <0.30 ng/mL  GLUCOSE, CAPILLARY     Status: Abnormal   Collection Time    08/12/12  5:44 AM      Result Value Range   Glucose-Capillary 180 (*) 70 - 99 mg/dL    Imaging results:  Dg Chest Port 1 View  08/11/2012  *RADIOLOGY REPORT*  Clinical Data: Shortness of breath, dizziness  PORTABLE CHEST - 1 VIEW  Comparison: 06/02/2012  Findings: Lungs are clear.  No pleural effusion or pneumothorax.  Cardiomediastinal silhouette is within normal limits.  IMPRESSION: No evidence of acute cardiopulmonary disease.   Original Report Authenticated By: Charline Bills, M.D.     Assessment and Plan: I agree with the formulated Assessment and Plan with the following changes:   1. Dizziness/SOB - Due to persistence of symptoms, will plan for MRI brain today - Her abnormalities in tandem gait can be due to her known neuropathy and poor position sense, however, with concomitant diplopia, will need to rule out brain lesion - Continue fall precautions. - TnT are normal X 3 - 2D echo is pending - EKG revealed prolonged Qt interval, which has improved with discontinuing flexeril.  D/C flexeril and change to Norco for pain; decrease cymbalta  2. Prolonged QTC - EKG revealed prolonged Qt interval, which has improved with discontinuing flexeril.  - D/C flexeril and change to Norco  for pain - decrease cymbalta  Other issues as per resident note.   Inez Catalina, MD 3/2/20149:56 AM

## 2012-08-13 DIAGNOSIS — R0602 Shortness of breath: Secondary | ICD-10-CM

## 2012-08-13 DIAGNOSIS — R0989 Other specified symptoms and signs involving the circulatory and respiratory systems: Secondary | ICD-10-CM

## 2012-08-13 LAB — BASIC METABOLIC PANEL
BUN: 13 mg/dL (ref 6–23)
Calcium: 10 mg/dL (ref 8.4–10.5)
Chloride: 100 mEq/L (ref 96–112)
Creatinine, Ser: 0.77 mg/dL (ref 0.50–1.10)
GFR calc Af Amer: >90 mL/min (ref >90)

## 2012-08-13 LAB — GLUCOSE, CAPILLARY: Glucose-Capillary: 164 mg/dL — ABNORMAL HIGH (ref 70–99)

## 2012-08-13 LAB — CBC
HCT: 39.4 % (ref 36.0–46.0)
MCH: 33.2 pg (ref 26.0–34.0)
MCV: 95.4 fL (ref 78.0–100.0)
Platelets: 209 10*3/uL (ref 150–400)
RDW: 13 % (ref 11.5–15.5)

## 2012-08-13 MED ORDER — NYSTATIN 100000 UNIT/GM EX POWD
Freq: Every day | CUTANEOUS | Status: DC | PRN
  Administered 2012-08-13: 21:00:00 via TOPICAL
  Filled 2012-08-13: qty 15

## 2012-08-13 MED ORDER — SALINE SPRAY 0.65 % NA SOLN
1.0000 | NASAL | Status: DC | PRN
  Administered 2012-08-13 – 2012-08-14 (×2): 1 via NASAL
  Filled 2012-08-13: qty 44

## 2012-08-13 NOTE — Evaluation (Signed)
Physical Therapy Evaluation Patient Details Name: [name] MRN: [id number] DOB: August 23, 1950 Today's Date: 08/13/2012 Time: 0865-7846 PT Time Calculation (min): 39 min  PT Assessment / Plan / Recommendation Clinical Impression  62 yo female admitted with dizziness. On eval pt was Supervision level assist for ambulation. Pt not unsteady, however she preferred to intermittently reach out for handrail to hold onto/stay close to walls/handrails. Performed several balance tasks without difficulty/LOB. Pt denied dizziness but did c/o SOB, lightheadedness, "tingly". Encouraged pt to take her time when changing positions and to sit and rest when necessary. Will plan to follow for one more visit before d/c, if possible. Pt states she has family available to help if needed as well.     PT Assessment  Patient needs continued PT services    Follow Up Recommendations  No PT follow up    Does the patient have the potential to tolerate intense rehabilitation      Barriers to Discharge        Equipment Recommendations  None recommended by PT    Recommendations for Other Services     Frequency Min 3X/week    Precautions / Restrictions Precautions Precautions: Fall Restrictions Weight Bearing Restrictions: No   Pertinent Vitals/Pain No c/o pain      Mobility  Bed Mobility Bed Mobility: Supine to Sit;Sit to Supine Supine to Sit: 7: Independent Sit to Supine: 7: Independent Transfers Transfers: Sit to Stand Sit to Stand: 7: Independent Stand to Sit: 7: Independent Ambulation/Gait Ambulation/Gait Assistance: 5: Supervision Ambulation Distance (Feet): 100 Feet (100'x1, 75'x1) Ambulation/Gait Assistance Details: Slow gait speed. Pt somewhat cautious, reaching out intermittently for handrail to hold onto. NO LOB while ambulating.  Gait Pattern: Step-through pattern;Decreased stride length Stairs: No    Exercises     PT Diagnosis: Difficulty walking  PT Problem List: Decreased  mobility;Decreased activity tolerance PT Treatment Interventions: Gait training;Stair training;Functional mobility training;Therapeutic activities;Therapeutic exercise;Patient/family education   PT Goals Acute Rehab PT Goals PT Goal Formulation: With patient Time For Goal Achievement: 08/20/12 Pt will Ambulate: 51 - 150 feet;with modified independence PT Goal: Ambulate - Progress: Goal set today Pt will Go Up / Down Stairs: 1-2 stairs;with modified independence PT Goal: Up/Down Stairs - Progress: Goal set today  Visit Information  Last PT Received On: 08/13/12 Assistance Needed: +1    Subjective Data  Subjective: I walk just fine...its not the walking Patient Stated Goal: home   Prior Functioning  Home Living Lives With: Family Type of Home: House Home Access: Stairs to enter Secretary/administrator of Steps: 2 Entrance Stairs-Rails: None Home Layout: One level Bathroom Shower/Tub: Teacher, adult education: None Prior Function Level of Independence: Independent Able to Take Stairs?: Yes Driving: Yes Communication Communication: No difficulties    Cognition  Cognition Overall Cognitive Status: Appears within functional limits for tasks assessed/performed Arousal/Alertness: Awake/alert Orientation Level: Appears intact for tasks assessed Behavior During Session: [facility] for tasks performed    Extremity/Trunk Assessment Right Lower Extremity Assessment RLE ROM/Strength/Tone: [facility] for tasks assessed Left Lower Extremity Assessment LLE ROM/Strength/Tone: [facility] for tasks assessed   Balance Balance Balance Assessed: Yes Static Standing Balance Static Standing - Balance Support: No upper extremity supported Static Standing - Level of Assistance: 6: Modified independent (Device/Increase time) Static Standing - Comment/# of Minutes: Had pt perform EO/EC, narrow BOS-no LOB/difficulty Dynamic Standing Balance Dynamic Standing - Level of Assistance: 5: Stand by  assistance Dynamic Standing - Comments: Had pt perform p/u object, 360 degree turn-increased time. NO LOB. No  c/o dizziness.  High Level Balance High Level Balance Activites: Backward walking;Head turns;Sudden stops;Turns;Direction changes High Level Balance Comments: Slow ambulation pace but pt able to complete all tasks without difficulty/LOB  End of Session PT - End of Session Equipment Utilized During Treatment: Gait belt Activity Tolerance: Patient tolerated treatment well Patient left: in bed;with call bell/phone within reach  GP     Rebeca Alert [facility] 08/13/2012, 4:38 PM [phone]

## 2012-08-13 NOTE — Consult Note (Signed)
NEURO HOSPITALIST CONSULT NOTE    Reason for Consult: Dizziness for 3 months  HPI:                                                                                                                                          [name] is an 62 y.o. female with history of migraines, chronic neck pain and uncontrolled DM2.  Patient now is reporting feeling of unsteadiness and dizziness which is intermittent--at times occurs when she stands up and at times when she has been up--and then sits down. She denies the room spinning or vertiginous sensation. She has had previous diplopia which is chronic and has been worked up in the past.  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  Her diplopia today is vertical and only occurs when she is looking at the TV.  In past it was horizontal and only occurred when she was driving. Patient is currently being evaluated by ophthalmology as outpatient. Primary team was concerned for stroke as a cause of her persistent dizziness and diplopia but MRI showed chronic microvascular changes and no acute or subacute stroke. She has migraine HA in past and often is resolved by use of narcotics. No complaints of neck pain or stiffness. No complains of blurred or decreased vision. No complaints of rushing or ringing in her ears.   Carotid dopplers--negative A1c-8.3 LDL-102 2 d echo pending    Past Medical History  Diagnosis Date  . Hypertension   . Diabetes mellitus without complication     diagnosed around 2010, only ever on metformin  . Chronic pain     neck pain, headache, neuropathy  . Hypertriglyceridemia   . Neuromuscular disorder   . COPD (chronic obstructive pulmonary disease)   . CHF (congestive heart failure)     Past Surgical History  Procedure Laterality Date  . Rotator cuff repair    . Carpal tunnel release    . Abdominal hysterectomy    . Cholecystectomy      Family  History: Mother DM, HTN Father HTN  Social History:  reports that she has been smoking Cigarettes.  She has a 10.25 pack-year smoking history. She has never used smokeless tobacco. She reports that she does not drink alcohol or use illicit drugs.  Allergies  Allergen Reactions  . Flexeril (Cyclobenzaprine) Other (See Comments)    Prolonged QTc to 571, tachycardia  . Soma (Carisoprodol) Itching and Rash    MEDICATIONS:  Prior to Admission:  Prescriptions prior to admission  Medication Sig Dispense Refill  . cyclobenzaprine (FLEXERIL) 10 MG tablet Take 1 tablet (10 mg total) by mouth 3 (three) times daily as needed. For muscle spasms  90 tablet  1  . gabapentin (NEURONTIN) 600 MG tablet Take 1 tablet (600 mg total) by mouth 3 (three) times daily. 3qam 2 noon 3qhs  90 tablet  5  . lisinopril (PRINIVIL,ZESTRIL) 20 MG tablet Take 1 tablet (20 mg total) by mouth daily.  30 tablet  5  . metoprolol (LOPRESSOR) 50 MG tablet Take 1 tablet (50 mg total) by mouth 2 (two) times daily.  60 tablet  11  . niacin (SLO-NIACIN) 500 MG tablet Take 500 mg by mouth 2 (two) times daily with a meal.      . butalbital-acetaminophen-caffeine (FIORICET, ESGIC) 50-325-40 MG per tablet Take 1 tablet by mouth 2 (two) times daily as needed. Stress headache      . DULoxetine (CYMBALTA) 30 MG capsule Take 1 capsule (30 mg total) by mouth daily. 30 mg in addition to 60 mg to equal 90 mg  30 capsule  1  . DULoxetine (CYMBALTA) 60 MG capsule Take 1 capsule (60 mg total) by mouth daily. 60 mg in addition to 30 mg to equal 90 mg  30 capsule  1  . HYDROcodone-acetaminophen (NORCO/VICODIN) 5-325 MG per tablet Take 1 tablet by mouth every 6 (six) hours as needed. For pain       Scheduled: . aspirin EC  81 mg Oral Daily  . DULoxetine  60 mg Oral Daily  . gabapentin  600 mg Oral TID  . heparin  5,000 Units  Subcutaneous Q8H  . insulin aspart  0-9 Units Subcutaneous TID WC  . lisinopril  20 mg Oral Daily  . meclizine  12.5 mg Oral BID  . metoprolol  50 mg Oral BID  . niacin  500 mg Oral BID WC  . sodium chloride  3 mL Intravenous Q12H     ROS:                                                                                                                                       History obtained from the patient  General ROS: negative for - chills, fatigue, fever, night sweats, weight gain or weight loss Psychological ROS: negative for - behavioral disorder, hallucinations, memory difficulties, mood swings or suicidal ideation Ophthalmic ROS: negative for - blurry vision, double vision, eye pain or loss of vision ENT ROS: negative for - epistaxis, nasal discharge, oral lesions, sore throat, tinnitus or vertigo Allergy and Immunology ROS: negative for - hives or itchy/watery eyes Hematological and Lymphatic ROS: negative for - bleeding problems, bruising or swollen lymph nodes Endocrine ROS: negative for - galactorrhea, hair pattern changes, polydipsia/polyuria or temperature intolerance Respiratory ROS: negative for - cough, hemoptysis, shortness of breath or wheezing Cardiovascular ROS: negative for -  chest pain, dyspnea on exertion, edema or irregular heartbeat Gastrointestinal ROS: negative for - abdominal pain, diarrhea, hematemesis, nausea/vomiting or stool incontinence Genito-Urinary ROS: negative for - dysuria, hematuria, incontinence or urinary frequency/urgency Musculoskeletal ROS: negative for - joint swelling or muscular weakness Neurological ROS: as noted in HPI Dermatological ROS: negative for rash and skin lesion changes   Blood pressure 125/76, pulse 84, temperature 98 F (36.7 C), temperature source Oral, resp. rate 20, height 5\' 6"  (1.676 m), weight 89.4 kg (197 lb 1.5 oz), SpO2 98.00%.   Neurologic Examination:                                                                                                       Mental Status: Alert, oriented, thought content appropriate.  Speech fluent without evidence of aphasia.  Able to follow 3 step commands without difficulty. Cranial Nerves: II: Discs flat bilaterally; Visual fields grossly normal, pupils equal, round, reactive to light and accommodation III,IV, VI: ptosis not present, extra-ocular motions intact bilaterally--when looking up both eyes will slightly splay laterally V,VII: smile symmetric, facial light touch sensation normal bilaterally VIII: hearing normal bilaterally IX,X: gag reflex present XI: bilateral shoulder shrug XII: midline tongue extension Motor: Right : Upper extremity   5/5    Left:     Upper extremity   5/5  Lower extremity   5/5     Lower extremity   5/5 Tone and bulk:normal tone throughout; no atrophy noted Sensory: Pinprick and light touch intact throughout, bilaterally--right foot has decreased sensation to vibration and cold touch from plantar surface to the mid calf.  Deep Tendon Reflexes: 2+ and symmetric throughout UE, 2+ bilateral AJ and no AJ Plantars: Right: downgoing   Left: downgoing Cerebellar: normal finger-to-nose,  normal heel-to-shin test Gait: narrow based, negative romberg.  CV: pulses palpable throughout    Lab Results  Component Value Date/Time   CHOL 217* 05/14/2012  5:00 AM    Results for orders placed during the hospital encounter of 08/11/12 (from the past 48 hour(s))  CBC     Status: None   Collection Time    08/11/12  2:29 PM      Result Value Range   WBC 6.8  4.0 - 10.5 K/uL   RBC 4.32  3.87 - 5.11 MIL/uL   Hemoglobin 13.6  12.0 - 15.0 g/dL   HCT 45.4  09.8 - 11.9 %   MCV 95.4  78.0 - 100.0 fL   MCH 31.5  26.0 - 34.0 pg   MCHC 33.0  30.0 - 36.0 g/dL   RDW 14.7  82.9 - 56.2 %   Platelets 187  150 - 400 K/uL  PRO B NATRIURETIC PEPTIDE     Status: Abnormal   Collection Time    08/11/12  2:31 PM      Result Value Range   Pro B Natriuretic peptide  (BNP) 258.0 (*) 0 - 125 pg/mL  D-DIMER, QUANTITATIVE     Status: Abnormal   Collection Time    08/11/12  2:31 PM      Result Value  Range   D-Dimer, Quant 0.61 (*) 0.00 - 0.48 ug/mL-FEU   Comment:            AT THE INHOUSE ESTABLISHED CUTOFF     VALUE OF 0.48 ug/mL FEU,     THIS ASSAY HAS BEEN DOCUMENTED     IN THE LITERATURE TO HAVE     A SENSITIVITY AND NEGATIVE     PREDICTIVE VALUE OF AT LEAST     98 TO 99%.  THE TEST RESULT     SHOULD BE CORRELATED WITH     AN ASSESSMENT OF THE CLINICAL     PROBABILITY OF DVT / VTE.  POCT I-STAT TROPONIN I     Status: None   Collection Time    08/11/12  2:40 PM      Result Value Range   Troponin i, poc 0.01  0.00 - 0.08 ng/mL   Comment 3            Comment: Due to the release kinetics of cTnI,     a negative result within the first hours     of the onset of symptoms does not rule out     myocardial infarction with certainty.     If myocardial infarction is still suspected,     repeat the test at appropriate intervals.  POCT I-STAT, CHEM 8     Status: Abnormal   Collection Time    08/11/12  2:41 PM      Result Value Range   Sodium 140  135 - 145 mEq/L   Potassium 3.9  3.5 - 5.1 mEq/L   Chloride 108  96 - 112 mEq/L   BUN 16  6 - 23 mg/dL   Creatinine, Ser 1.61  0.50 - 1.10 mg/dL   Glucose, Bld 096 (*) 70 - 99 mg/dL   Calcium, Ion 0.45  4.09 - 1.23 mmol/L   TCO2 21  0 - 100 mmol/L   Hemoglobin 13.9  12.0 - 15.0 g/dL   HCT 81.1  91.4 - 78.2 %  URINE RAPID DRUG SCREEN (HOSP PERFORMED)     Status: None   Collection Time    08/11/12  8:31 PM      Result Value Range   Opiates NONE DETECTED  NONE DETECTED   Cocaine NONE DETECTED  NONE DETECTED   Benzodiazepines NONE DETECTED  NONE DETECTED   Amphetamines NONE DETECTED  NONE DETECTED   Tetrahydrocannabinol NONE DETECTED  NONE DETECTED   Barbiturates NONE DETECTED  NONE DETECTED   Comment:            DRUG SCREEN FOR MEDICAL PURPOSES     ONLY.  IF CONFIRMATION IS NEEDED     FOR ANY  PURPOSE, NOTIFY LAB     WITHIN 5 DAYS.                LOWEST DETECTABLE LIMITS     FOR URINE DRUG SCREEN     Drug Class       Cutoff (ng/mL)     Amphetamine      1000     Barbiturate      200     Benzodiazepine   200     Tricyclics       300     Opiates          300     Cocaine          300     THC  50  GLUCOSE, CAPILLARY     Status: Abnormal   Collection Time    08/11/12 10:06 PM      Result Value Range   Glucose-Capillary 222 (*) 70 - 99 mg/dL  COMPREHENSIVE METABOLIC PANEL     Status: Abnormal   Collection Time    08/11/12 10:09 PM      Result Value Range   Sodium 134 (*) 135 - 145 mEq/L   Potassium 4.4  3.5 - 5.1 mEq/L   Comment: HEMOLYSIS AT THIS LEVEL MAY AFFECT RESULT   Chloride 100  96 - 112 mEq/L   CO2 23  19 - 32 mEq/L   Glucose, Bld 236 (*) 70 - 99 mg/dL   BUN 13  6 - 23 mg/dL   Creatinine, Ser 8.29  0.50 - 1.10 mg/dL   Calcium 9.3  8.4 - 56.2 mg/dL   Total Protein 6.7  6.0 - 8.3 g/dL   Albumin 3.3 (*) 3.5 - 5.2 g/dL   AST 36  0 - 37 U/L   ALT 21  0 - 35 U/L   Comment: HEMOLYSIS AT THIS LEVEL MAY AFFECT RESULT   Alkaline Phosphatase 49  39 - 117 U/L   Comment: HEMOLYSIS AT THIS LEVEL MAY AFFECT RESULT   Total Bilirubin 0.3  0.3 - 1.2 mg/dL   GFR calc non Af Amer 90 (*) >90 mL/min   GFR calc Af Amer >90  >90 mL/min   Comment:            The eGFR has been calculated     using the CKD EPI equation.     This calculation has not been     validated in all clinical     situations.     eGFR's persistently     <90 mL/min signify     possible Chronic Kidney Disease.  TROPONIN I     Status: None   Collection Time    08/11/12 10:09 PM      Result Value Range   Troponin I <0.30  <0.30 ng/mL   Comment:            Due to the release kinetics of cTnI,     a negative result within the first hours     of the onset of symptoms does not rule out     myocardial infarction with certainty.     If myocardial infarction is still suspected,     repeat the  test at appropriate intervals.  COMPREHENSIVE METABOLIC PANEL     Status: Abnormal   Collection Time    08/12/12  5:30 AM      Result Value Range   Sodium 136  135 - 145 mEq/L   Potassium 4.1  3.5 - 5.1 mEq/L   Chloride 105  96 - 112 mEq/L   CO2 20  19 - 32 mEq/L   Glucose, Bld 190 (*) 70 - 99 mg/dL   BUN 12  6 - 23 mg/dL   Creatinine, Ser 1.30  0.50 - 1.10 mg/dL   Calcium 9.5  8.4 - 86.5 mg/dL   Total Protein 6.4  6.0 - 8.3 g/dL   Albumin 3.1 (*) 3.5 - 5.2 g/dL   AST 18  0 - 37 U/L   ALT 22  0 - 35 U/L   Alkaline Phosphatase 55  39 - 117 U/L   Total Bilirubin 0.3  0.3 - 1.2 mg/dL   GFR calc non Af Amer >90  >90 mL/min  GFR calc Af Amer >90  >90 mL/min   Comment:            The eGFR has been calculated     using the CKD EPI equation.     This calculation has not been     validated in all clinical     situations.     eGFR's persistently     <90 mL/min signify     possible Chronic Kidney Disease.  CBC     Status: Abnormal   Collection Time    08/12/12  5:30 AM      Result Value Range   WBC 5.5  4.0 - 10.5 K/uL   RBC 4.10  3.87 - 5.11 MIL/uL   Hemoglobin 13.1  12.0 - 15.0 g/dL   HCT 16.1  09.6 - 04.5 %   MCV 95.4  78.0 - 100.0 fL   MCH 32.0  26.0 - 34.0 pg   MCHC 33.5  30.0 - 36.0 g/dL   RDW 40.9  81.1 - 91.4 %   Platelets 149 (*) 150 - 400 K/uL  TROPONIN I     Status: None   Collection Time    08/12/12  5:30 AM      Result Value Range   Troponin I <0.30  <0.30 ng/mL   Comment:            Due to the release kinetics of cTnI,     a negative result within the first hours     of the onset of symptoms does not rule out     myocardial infarction with certainty.     If myocardial infarction is still suspected,     repeat the test at appropriate intervals.  GLUCOSE, CAPILLARY     Status: Abnormal   Collection Time    08/12/12  5:44 AM      Result Value Range   Glucose-Capillary 180 (*) 70 - 99 mg/dL  GLUCOSE, CAPILLARY     Status: Abnormal   Collection Time     08/12/12 10:38 AM      Result Value Range   Glucose-Capillary 205 (*) 70 - 99 mg/dL   Comment 1 Notify RN    GLUCOSE, CAPILLARY     Status: Abnormal   Collection Time    08/12/12  5:16 PM      Result Value Range   Glucose-Capillary 104 (*) 70 - 99 mg/dL   Comment 1 Notify RN    GLUCOSE, CAPILLARY     Status: Abnormal   Collection Time    08/12/12  9:21 PM      Result Value Range   Glucose-Capillary 155 (*) 70 - 99 mg/dL  GLUCOSE, CAPILLARY     Status: Abnormal   Collection Time    08/13/12  5:49 AM      Result Value Range   Glucose-Capillary 126 (*) 70 - 99 mg/dL  CBC     Status: None   Collection Time    08/13/12  9:24 AM      Result Value Range   WBC 6.6  4.0 - 10.5 K/uL   Comment: WHITE COUNT CONFIRMED ON SMEAR   RBC 4.13  3.87 - 5.11 MIL/uL   Hemoglobin 13.7  12.0 - 15.0 g/dL   HCT 78.2  95.6 - 21.3 %   MCV 95.4  78.0 - 100.0 fL   MCH 33.2  26.0 - 34.0 pg   MCHC 34.8  30.0 - 36.0 g/dL   RDW 13.0  11.5 - 15.5 %   Platelets 209  150 - 400 K/uL   Comment: DELTA CHECK NOTED  BASIC METABOLIC PANEL     Status: Abnormal   Collection Time    08/13/12  9:24 AM      Result Value Range   Sodium 135  135 - 145 mEq/L   Potassium 5.0  3.5 - 5.1 mEq/L   Comment: HEMOLYSIS AT THIS LEVEL MAY AFFECT RESULT   Chloride 100  96 - 112 mEq/L   CO2 25  19 - 32 mEq/L   Glucose, Bld 204 (*) 70 - 99 mg/dL   BUN 13  6 - 23 mg/dL   Creatinine, Ser 4.09  0.50 - 1.10 mg/dL   Calcium 81.1  8.4 - 91.4 mg/dL   GFR calc non Af Amer >90  >90 mL/min   GFR calc Af Amer >90  >90 mL/min   Comment:            The eGFR has been calculated     using the CKD EPI equation.     This calculation has not been     validated in all clinical     situations.     eGFR's persistently     <90 mL/min signify     possible Chronic Kidney Disease.  GLUCOSE, CAPILLARY     Status: Abnormal   Collection Time    08/13/12 11:11 AM      Result Value Range   Glucose-Capillary 164 (*) 70 - 99 mg/dL   Comment 1  Notify RN      Mr Laqueta Jean Wo Contrast  08/12/2012  *RADIOLOGY REPORT*  Clinical Data: Diabetic hypertensive patient presenting with headache, diplopia and dizziness.  Evaluate for cerebellar stroke.  MRI HEAD WITHOUT AND WITH CONTRAST  Technique:  Multiplanar, multiecho pulse sequences of the brain and surrounding structures were obtained according to standard protocol without and with intravenous contrast  Contrast: 20mL MULTIHANCE GADOBENATE DIMEGLUMINE 529 MG/ML IV SOLN  Comparison: None.  Findings: No acute infarct.  Prominent white matter type changes not only involving the supratentorial region but also with prominent involvement of the pons most consistent with result of small vessel disease given the patient's history.  No intracranial hemorrhage.  No intracranial mass or abnormal enhancement.  Partially empty sella.  This may be an incidental finding although also seen in patients with pseudotumor cerebri.  Mild exophthalmos.  Probable polypoid lesion rather than fluid level left maxillary sinus.  Mild polypoid opacification inferior right maxillary sinus.  IMPRESSION: No acute infarct.  Prominent small vessel disease type changes as noted above.  No intracranial mass or abnormal enhancement.  Partially empty sella.  This may be an incidental finding although also seen in patients with pseudotumor cerebri.  Mild exophthalmos.  Probable polypoid lesion rather than fluid level left maxillary sinus.  Mild polypoid opacification inferior right maxillary sinus.   Original Report Authenticated By: Lacy Duverney, M.D.    Dg Chest Port 1 View  08/11/2012  *RADIOLOGY REPORT*  Clinical Data: Shortness of breath, dizziness  PORTABLE CHEST - 1 VIEW  Comparison: 06/02/2012  Findings: Lungs are clear.  No pleural effusion or pneumothorax.  Cardiomediastinal silhouette is within normal limits.  IMPRESSION: No evidence of acute cardiopulmonary disease.   Original Report Authenticated By: Charline Bills, M.D.       Assessment/Plan: 62 YO female with known visual problems secondary to DM, now complaining of "dizzy and off balance sensation that can occur both when standing up  or when sitting down", intermittent with no vertiginous association.  She also has complaints of horizontal and vertical diplopia that is intermittent and resolves if she changers her glasses. Exam shows no focal localizing or lateralizing symptoms other than slight splaying of eyes laterally when looking up, and inconsistent sensory exam, with normal gait. MRI of brain showed no infarct in either anterior or posterior circulation.   At this time agree with out patient neurology follow up to further evaluate intermittent diplopia and sensation of being of balance.  Also agree with ophthalmological evaluation.  No further neurological diagnostic tests warranted in the in patient setting.  Neurology will S/O  Assessment and plan discussed with with attending physician Dr. Leroy Kennedy and he is in agreement.    Felicie Morn PA-C [facility] [phone]  08/13/2012, 1:56 PM  Patient seen and examined together with physician assistant and I concur with the assessment and plan.  Wyatt Portela, MD

## 2012-08-13 NOTE — Progress Notes (Addendum)
Subjective: She denies headache today but reported that her shortness of breath last night that improved with O2 supplementation. She had recurrent shortness of breath this morning while walking in her room. She reports continued nasal congestion. Intermittent double vision today.    Objective: Vital signs in last 24 hours: Filed Vitals:   08/12/12 2135 08/13/12 0113 08/13/12 0545 08/13/12 0959  BP: 154/80 143/96 128/83 125/76  Pulse: 66 72 59 84  Temp: 98.1 F (36.7 C) 98.2 F (36.8 C) 97.3 F (36.3 C) 98 F (36.7 C)  TempSrc: Oral Oral Oral Oral  Resp: 18 18 18 20   Height:      Weight:   197 lb 1.5 oz (89.4 kg)   SpO2: 98% 98% 98% 98%   Weight change: -1 lb 0.1 oz (-0.458 kg)  Intake/Output Summary (Last 24 hours) at 08/13/12 1306 Last data filed at 08/13/12 1002  Gross per 24 hour  Intake    680 ml  Output   1700 ml  Net  -1020 ml   Vitals reviewed.  General: resting in bed, in NAD  HEENT: PERRL, EOM: splaying of eyes with upward gaze (no left eye deviation as noted on exam yesterday).  Cardiac: RRR, no rubs, murmurs or gallops  Pulm: clear to auscultation bilaterally, no wheezes, rales, or rhonchi  Abd: soft, nontender, nondistended, BS present  Ext: warm and well perfused, no pedal edema  Neuro: alert and oriented X3, CN II-XII grossly intact except for possible CN III dysfunction due to unconjugated upward gaze. Lab Results: Basic Metabolic Panel:  Recent Labs Lab 08/12/12 0530 08/13/12 0924  NA 136 135  K 4.1 5.0  CL 105 100  CO2 20 25  GLUCOSE 190* 204*  BUN 12 13  CREATININE 0.64 0.77  CALCIUM 9.5 10.0   Liver Function Tests:  Recent Labs Lab 08/11/12 2209 08/12/12 0530  AST 36 18  ALT 21 22  ALKPHOS 49 55  BILITOT 0.3 0.3  PROT 6.7 6.4  ALBUMIN 3.3* 3.1*   CBC:  Recent Labs Lab 08/12/12 0530 08/13/12 0924  WBC 5.5 6.6  HGB 13.1 13.7  HCT 39.1 39.4  MCV 95.4 95.4  PLT 149* 209   Cardiac Enzymes:  Recent Labs Lab 08/11/12 2209  08/12/12 0530  TROPONINI <0.30 <0.30   BNP:  Recent Labs Lab 08/11/12 1431  PROBNP 258.0*   D-Dimer:  Recent Labs Lab 08/11/12 1431  DDIMER 0.61*   CBG:  Recent Labs Lab 08/12/12 0544 08/12/12 1038 08/12/12 1716 08/12/12 2121 08/13/12 0549 08/13/12 1111  GLUCAP 180* 205* 104* 155* 126* 164*   Urine Drug Screen: Drugs of Abuse     Component Value Date/Time   LABOPIA NONE DETECTED 08/11/2012 2031   COCAINSCRNUR NONE DETECTED 08/11/2012 2031   LABBENZ NONE DETECTED 08/11/2012 2031   AMPHETMU NONE DETECTED 08/11/2012 2031   THCU NONE DETECTED 08/11/2012 2031   LABBARB NONE DETECTED 08/11/2012 2031     Micro Results: No results found for this or any previous visit (from the past 240 hour(s)). Studies/Results: Mr Laqueta Jean ZO Contrast  08/12/2012  *RADIOLOGY REPORT*  Clinical Data: Diabetic hypertensive patient presenting with headache, diplopia and dizziness.  Evaluate for cerebellar stroke.  MRI HEAD WITHOUT AND WITH CONTRAST  Technique:  Multiplanar, multiecho pulse sequences of the brain and surrounding structures were obtained according to standard protocol without and with intravenous contrast  Contrast: 20mL MULTIHANCE GADOBENATE DIMEGLUMINE 529 MG/ML IV SOLN  Comparison: None.  Findings: No acute infarct.  Prominent white  matter type changes not only involving the supratentorial region but also with prominent involvement of the pons most consistent with result of small vessel disease given the patient's history.  No intracranial hemorrhage.  No intracranial mass or abnormal enhancement.  Partially empty sella.  This may be an incidental finding although also seen in patients with pseudotumor cerebri.  Mild exophthalmos.  Probable polypoid lesion rather than fluid level left maxillary sinus.  Mild polypoid opacification inferior right maxillary sinus.  IMPRESSION: No acute infarct.  Prominent small vessel disease type changes as noted above.  No intracranial mass or abnormal  enhancement.  Partially empty sella.  This may be an incidental finding although also seen in patients with pseudotumor cerebri.  Mild exophthalmos.  Probable polypoid lesion rather than fluid level left maxillary sinus.  Mild polypoid opacification inferior right maxillary sinus.   Original Report Authenticated By: Lacy Duverney, M.D.    Dg Chest Port 1 View  08/11/2012  *RADIOLOGY REPORT*  Clinical Data: Shortness of breath, dizziness  PORTABLE CHEST - 1 VIEW  Comparison: 06/02/2012  Findings: Lungs are clear.  No pleural effusion or pneumothorax.  Cardiomediastinal silhouette is within normal limits.  IMPRESSION: No evidence of acute cardiopulmonary disease.   Original Report Authenticated By: Charline Bills, M.D.    Medications: I have reviewed the patient's current medications. Scheduled Meds: . aspirin EC  81 mg Oral Daily  . DULoxetine  60 mg Oral Daily  . gabapentin  600 mg Oral TID  . heparin  5,000 Units Subcutaneous Q8H  . insulin aspart  0-9 Units Subcutaneous TID WC  . lisinopril  20 mg Oral Daily  . meclizine  12.5 mg Oral BID  . metoprolol  50 mg Oral BID  . niacin  500 mg Oral BID WC  . sodium chloride  3 mL Intravenous Q12H   Continuous Infusions:  PRN Meds:.HYDROcodone-acetaminophen Assessment/Plan: Dizziness. Persistent, described as an imbalance, improved today. 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 ACS less likely with troponin negative x3, no chest pain (though she has DM2), and no ST elevation. UDS negative. Neurology consulted with no further diagnostic work up recommended at this time but outpatient follow up. PT evaluated with no HH recommendation.  -Carotid doppler b/l normal -2D echo read pending -meclizine   Diplopia. Intermittent. She reports today having diplopia for almost one year while  that has progressively worsened since she arrived in [place]. She was seen by ophthalmologist and told to change her prescription glasses. MRI brain with no intracranial process to explain diplopia, no obvious EOM defects or changes. Neurology evaluated with improved EOM function today.  -may need ophthalmology referral as outpatient  -Neurology follow up as outpatient  Migraine headaches. Improved with Norco. Holding Fiorecet for now. No obvious intracranial process noted on MRI.    Hypertension. BP well controlled today. She reports being on HCTZ in the past as par of her CHF tx.  -Continue lisinopril and metoprolol, will consider going up on lisinopril dose  -f/u on Echo  Prolonged QTc. Resolved. EKG on presentation with QTc of 571. Previous EKG with QTc of 467. This was thought to be secondary to flexeril use and has resolved now with discontinuation of this medications. Repeat EKG with QTc of 444. Updated Allergies with contraindication for flexeril. Pt counseled to avoid flexeril and likely TCAs.  -Discontinued flexeril  -Decreased Cymbalta to 60mg   daily  -Telemetry   DM2. Hgb A1C on 05/13/12 at 8.3. She is on metformin 1000mg  BID at home.  -Hold metformin while inpatient setting  -SSI   VTE prophylaxis. Heparin TID  FEN  NSL  Electrolyte repletion as needed  Carb mod diet   Dispo: Disposition is deferred at this time, awaiting improvement of current medical problems. Anticipated discharge likely tomorrow.  The patient does have a current PCP (SCHOOLER, KAREN, MD), therefore will be requiring OPC follow-up after discharge.  The patient does not have transportation limitations that hinder transportation to clinic appointments.  .Services Needed at time of discharge: Y = Yes, Blank = No  PT:    OT:    RN:    Equipment:    Other:        LOS: 2 days   Sara Chu D 08/13/2012, 1:06 PM

## 2012-08-13 NOTE — Progress Notes (Signed)
Utilization Review Completed Camellia J. Wood, RN, BSN, NCM [phone]  

## 2012-08-13 NOTE — Progress Notes (Signed)
Internal Medicine Teaching Service Attending Note Date: 08/13/2012  Patient name: [name]  Medical record number: [id number]  Date of birth: 04-10-1951    This patient has been seen and discussed with the house staff. Please see their note for complete details. I concur with their findings with the following additions/corrections: Patient feels much better as compared to the day of admission. 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 Patient told me that the last episode of diplopia was about 8 months ago and lasted for a month, this was worked up in [place]. Patient's current episode of diplopia has been going on since she moved to [place] in November. Patient is currently being evaluated by ophthalmology as outpatient. There was a concern of posterior stroke as a cause of her persistent dizziness. Patient does have chronic microvascular changes but no acute or subacute stroke noted on MRI. Patient has empty sella which is noted and more than 30% of patients as a normal finding. I do not believe that any of patient's symptoms warrants further diagnostic studies. Neurology was consulted and he is awaiting input regarding the necessity for any further inpatient recommendations that we might be missing in this case. Patient may benefit from an continued outpatient evaluation by neurology. Patient should continue to followup with ophthalmology. I believe that patient is ready to go home from medical point of view at this time.  Lars Mage 08/13/2012, 1:43 PM

## 2012-08-13 NOTE — Progress Notes (Signed)
  Echocardiogram 2D Echocardiogram has been performed.  [name], [name] 08/13/2012, 11:35 AM

## 2012-08-14 DIAGNOSIS — I509 Heart failure, unspecified: Secondary | ICD-10-CM

## 2012-08-14 DIAGNOSIS — I5030 Unspecified diastolic (congestive) heart failure: Secondary | ICD-10-CM

## 2012-08-14 DIAGNOSIS — IMO0001 Reserved for inherently not codable concepts without codable children: Secondary | ICD-10-CM

## 2012-08-14 DIAGNOSIS — I110 Hypertensive heart disease with heart failure: Secondary | ICD-10-CM

## 2012-08-14 LAB — CBC
HCT: 38.1 % (ref 36.0–46.0)
Hemoglobin: 13 g/dL (ref 12.0–15.0)
MCHC: 34.1 g/dL (ref 30.0–36.0)

## 2012-08-14 LAB — GLUCOSE, CAPILLARY
Glucose-Capillary: 158 mg/dL — ABNORMAL HIGH (ref 70–99)
Glucose-Capillary: 205 mg/dL — ABNORMAL HIGH (ref 70–99)

## 2012-08-14 LAB — BASIC METABOLIC PANEL
BUN: 14 mg/dL (ref 6–23)
GFR calc non Af Amer: >90 mL/min (ref >90)
Glucose, Bld: 142 mg/dL — ABNORMAL HIGH (ref 70–99)
Potassium: 4.4 mEq/L (ref 3.5–5.1)

## 2012-08-14 MED ORDER — SALINE SPRAY 0.65 % NA SOLN: 1.0000 | NASAL | Status: DC | PRN

## 2012-08-14 MED ORDER — METFORMIN HCL 500 MG PO TABS: 1000.0000 mg | ORAL_TABLET | Freq: Two times a day (BID) | ORAL | Status: DC

## 2012-08-14 MED ORDER — FLUTICASONE PROPIONATE 50 MCG/ACT NA SUSP: 2.0000 | Freq: Every day | NASAL | Status: DC

## 2012-08-14 MED ORDER — HYDROCODONE-ACETAMINOPHEN 5-325 MG PO TABS: 1.0000 | ORAL_TABLET | Freq: Four times a day (QID) | ORAL | Status: DC | PRN

## 2012-08-14 MED ORDER — NYSTATIN 100000 UNIT/GM EX POWD: CUTANEOUS | Status: DC

## 2012-08-14 MED ORDER — DULOXETINE HCL 60 MG PO CPEP: 60.0000 mg | ORAL_CAPSULE | Freq: Every day | ORAL | Status: DC

## 2012-08-14 MED ORDER — HYDROCHLOROTHIAZIDE 12.5 MG PO CAPS: 12.5000 mg | ORAL_CAPSULE | Freq: Every day | ORAL | Status: DC

## 2012-08-14 MED ORDER — ASPIRIN 81 MG PO TBEC: 81.0000 mg | DELAYED_RELEASE_TABLET | Freq: Every day | ORAL | Status: DC

## 2012-08-14 NOTE — Discharge Summary (Signed)
Internal Medicine Teaching [facility] Discharge Note  Name: [name] MRN: [id number] DOB: February 28, 1951 62 y.o.  Date of Admission: 08/11/2012  1:57 PM Date of Discharge: 08/14/2012 Attending Physician: Lars Mage, MD  Discharge Diagnosis: Principal Problem:   Dizziness Active Problems:   Type 2 diabetes, uncontrolled, with neuropathy   Hypertension   Tachycardia   Diastolic CHF, chronic grade 2   Discharge Medications:   Medication List    STOP taking these medications       butalbital-acetaminophen-caffeine 50-325-40 MG per tablet  Commonly known as:  FIORICET, ESGIC     cyclobenzaprine 10 MG tablet  Commonly known as:  FLEXERIL      TAKE these medications       aspirin 81 MG EC tablet  Take 1 tablet (81 mg total) by mouth daily.     DULoxetine 60 MG capsule  Commonly known as:  CYMBALTA  Take 1 capsule (60 mg total) by mouth daily.     fluticasone 50 MCG/ACT nasal spray  Commonly known as:  FLONASE  Place 2 sprays into the nose daily.     gabapentin 600 MG tablet  Commonly known as:  NEURONTIN  Take 1 tablet (600 mg total) by mouth 3 (three) times daily. 3qam 2 noon 3qhs     hydrochlorothiazide 12.5 MG capsule  Commonly known as:  MICROZIDE  Take 1 capsule (12.5 mg total) by mouth daily.     HYDROcodone-acetaminophen 5-325 MG per tablet  Commonly known as:  NORCO/VICODIN  Take 1-2 tablets by mouth every 6 (six) hours as needed.     lisinopril 20 MG tablet  Commonly known as:  PRINIVIL,ZESTRIL  Take 1 tablet (20 mg total) by mouth daily.     metFORMIN 500 MG tablet  Commonly known as:  GLUCOPHAGE  Take 2 tablets (1,000 mg total) by mouth 2 (two) times daily with a meal.     metoprolol 50 MG tablet  Commonly known as:  LOPRESSOR  Take 1 tablet (50 mg total) by mouth 2 (two) times daily.     niacin 500 MG tablet  Commonly known as:  SLO-NIACIN  Take 500 mg by mouth 2 (two) times daily with a meal.     nystatin 100000 UNIT/GM Powd  Apply  to the affected areas daily or as needed after bathing.     sodium chloride 0.65 % Soln nasal spray  Commonly known as:  OCEAN  Place 1 spray into the nose as needed for congestion.        Disposition and follow-up:   [name] was discharged from [facility] in Good condition.  At the hospital follow up visit please address: -Reassess BP with newly started HCTZ -Obtain BMET  For Na, K trending with HCTZ -Refer to Neurology for outpatient follow up of imbalance -Refer to Ophthalmology for outpatient follow up for diplopia   Follow-up Appointments:     Follow-up Information   Schedule an appointment as soon as possible for a visit with Kristie Cowman, MD. (You need to be seen next week for repeat labs and blood pressure check as well as referral to ophthamology and Neurology. Bring all your medications with you)    Contact information:   [address] [place] [address] [phone]      Discharge Orders   Future Orders Complete By Expires     Diet - low sodium heart healthy  As directed     Increase activity slowly  As  directed        Consultations:  Dr. Leroy Kennedy, Neurology  Procedures Performed:  Mr Laqueta Jean Wo Contrast  08/12/2012  *RADIOLOGY REPORT*  Clinical Data: Diabetic hypertensive patient presenting with headache, diplopia and dizziness.  Evaluate for cerebellar stroke.  MRI HEAD WITHOUT AND WITH CONTRAST  Technique:  Multiplanar, multiecho pulse sequences of the brain and surrounding structures were obtained according to standard protocol without and with intravenous contrast  Contrast: 20mL MULTIHANCE GADOBENATE DIMEGLUMINE 529 MG/ML IV SOLN  Comparison: None.  Findings: No acute infarct.  Prominent white matter type changes not only involving the supratentorial region but also with prominent involvement of the pons most consistent with result of small vessel disease given the patient's history.  No intracranial hemorrhage.  No  intracranial mass or abnormal enhancement.  Partially empty sella.  This may be an incidental finding although also seen in patients with pseudotumor cerebri.  Mild exophthalmos.  Probable polypoid lesion rather than fluid level left maxillary sinus.  Mild polypoid opacification inferior right maxillary sinus.  IMPRESSION: No acute infarct.  Prominent small vessel disease type changes as noted above.  No intracranial mass or abnormal enhancement.  Partially empty sella.  This may be an incidental finding although also seen in patients with pseudotumor cerebri.  Mild exophthalmos.  Probable polypoid lesion rather than fluid level left maxillary sinus.  Mild polypoid opacification inferior right maxillary sinus.   Original Report Authenticated By: Lacy Duverney, M.D.    Dg Chest Port 1 View  08/11/2012  *RADIOLOGY REPORT*  Clinical Data: Shortness of breath, dizziness  PORTABLE CHEST - 1 VIEW  Comparison: 06/02/2012  Findings: Lungs are clear.  No pleural effusion or pneumothorax.  Cardiomediastinal silhouette is within normal limits.  IMPRESSION: No evidence of acute cardiopulmonary disease.   Original Report Authenticated By: Charline Bills, M.D.     2D Echo: 08/13/12 Study Conclusions  - Left ventricle: The cavity size was normal. Wall thickness was increased in a pattern of mild LVH. Systolic function was normal. The estimated ejection fraction was in the range of 55% to 60%. Regional wall motion abnormalities cannot be excluded. Features are consistent with a pseudonormal left ventricular filling pattern, with concomitant abnormal relaxation and increased filling pressure (grade 2 diastolic dysfunction). Doppler parameters are consistent with high ventricular filling pressure. - Left atrium: The atrium was mildly dilated.   Admission HPI:  [name] is a 62 year old woman with PMH of DM2, HTN, and chronic tachycardia who comes in to the [facility] ED for evaluation of dizziness and shortness of  breath. She states that she was short of breath last night and this morning but the shortness of breath has completely subsided now. She has nasal congestion that has effected her breathing but she denies rhinorrhea, cough, fever/chills, sinus pain, or sinus pressure. Her dizziness started last night with the sensation of the room spinning. The dizziness comes and goes and is more pronounced with standing. She denies ringing in her ears, nausea, vomiting, decreased intake per mouth, or falls. She does not recall having this symptoms before.  She was seen at there PCP in December for diarrhea which was treated for presumed C. Diff colitis with Flagyl. She finished Flagyl last month and states that the diarrhea has completely subsided now.  She also reports having diplopia for months now. She reports being told by her eye doctor to change her eyeglasses to prevent diplopia.  She denies prolonged car rides, recent medication changes, recent alcohol use, chest  pain, changes in her hearing, abdominal pain, N/V, diarrhea, leg edema, calf pain, history of blood clots, weakness, changes in speech, or confusion.    Hospital Course by problem list: Dizziness. Persistent, described as an imbalance that is worse at the end of the day. Etiology is still unclear at that time of her discharge but to include vertigo (although no vertiginous symptoms), cerebellar stroke (MRI: pons small vessel disease but no obvious acute stroke), diabetic autonomic neuropathy, decreased proprioception. ACS less likely with troponin negative x3, no chest pain (though she has DM2), and no ST elevation. UDS negative. Neurology consulted with no further diagnostic work up recommended at this time but outpatient follow up. Physical therapy evaluation with no HH recommendation. Her bilateral carotid doppler was normal. Her 2D echo revealed grade 2 diastolic CHF with preserved EF of 55-60%. She was given meclizine for possible vertigo but this was  discontinued as she did not note improvement of her dizziness while taking this medication. She will be discharged with instruction to follow up with Neurology for monitoring and further evaluation of her dizziness.   Shortness of breath: Mild, intermittent, worse with standing. She does not carry the diagnoses of COPD but she is a current everyday smoker. She does have mild sinuitis with nasal congestion. No cough, fever/ chills, or rhinorrhea reported or noted during this hospitalization.Her 2D echo with grade 2 diastolic dysfunction with preserved EF. She has trace pretibial edema but no pulmonary edema with hypervolemia not likely to explain her shortness of breath. She was started on saline nasal spray and Flonase nasal spray daily for her nasal congestion. She was also started with HCTZ 12.5mg  daily given trace edema, CHF, and elevated BP.   Diastolic CHF.  Her 2D echo on 08/13/12 revealed grade 2 diastolic dysfunction. We continued her lisinopril, ASA, and started her on HCTZ 12.5mg . She will follow up at the [facility] for volume status evaluation.   Diplopia. Intermittent, worse at the end of the day. She reports having diplopia for almost one year. He diplopia has progressively worsened since she arrived in [place]. She was seen by ophthalmologist and was told to change her prescription glasses. Initial EOM concerning for deficit of left superior rectus muscle as she had left eye left deviation with upwards gaze. MRI brain with no intracranial process to explain diplopia, no obvious EOM defects or changes. Neurology consulted, with improved EOM function on physical exam. Etiology unclear (perhaps Myasthenia Gravis?) and she may need further work up as outpatient if this problem persists. She will follow up with the [facility] for referral to ophthalmology and Neurology.   Migraine headaches. Improved with Norco.  We discontinued Fioricet. No obvious intracranial process noted on MRI.   Hypertension. BP elevated to  154/75 on the day of her discharge. She reported being on HCTZ in the past as part of her CHF treatment. We continued her lisinopril and metoprolol, and started HCTZ 12.5mg  daily. She will have BP reassessment a the [facility].   Prolonged QTc. Resolved. EKG on presentation with QTc of 571. Previous EKG with QTc of 467. This was thought to be secondary tom Flexeril use and resolved with discontinuation of this medication. Cymbalta decreased to 60mg  for concerns of possible QTc prolongation with higher dose. Repeat EKG with QTc of 444. Updated Allergies with contraindication for Flexeril. Pt counseled to avoid flexeril and likely TCAs.   DM2. Hgb A1C on 05/13/12 at 8.3. She is on metformin 1000mg  BID at home. We held metformin during this  hospitalization and placed her on SSI. Her CBGs were stable at 100s. We will resume her metformin upon her discharge.   Discharge Vitals:  BP 154/75  Pulse 79  Temp(Src) 97.3 F (36.3 C) (Oral)  Resp 19  Ht 5\' 6"  (1.676 m)  Wt 197 lb 1.5 oz (89.4 kg)  BMI 31.83 kg/m2  SpO2 98%  Discharge Labs:  Results for orders placed during the hospital encounter of 08/11/12 (from the past 24 hour(s))  GLUCOSE, CAPILLARY     Status: Abnormal   Collection Time    08/13/12 11:11 AM      Result Value Range   Glucose-Capillary 164 (*) 70 - 99 mg/dL   Comment 1 Notify RN    GLUCOSE, CAPILLARY     Status: Abnormal   Collection Time    08/13/12  4:30 PM      Result Value Range   Glucose-Capillary 145 (*) 70 - 99 mg/dL   Comment 1 Notify RN    GLUCOSE, CAPILLARY     Status: Abnormal   Collection Time    08/13/12 10:09 PM      Result Value Range   Glucose-Capillary 157 (*) 70 - 99 mg/dL   Comment 1 Notify RN    GLUCOSE, CAPILLARY     Status: Abnormal   Collection Time    08/14/12  5:54 AM      Result Value Range   Glucose-Capillary 158 (*) 70 - 99 mg/dL  CBC     Status: None   Collection Time    08/14/12  7:10 AM      Result Value Range   WBC 4.7  4.0 - 10.5 K/uL    RBC 4.02  3.87 - 5.11 MIL/uL   Hemoglobin 13.0  12.0 - 15.0 g/dL   HCT 96.0  45.4 - 09.8 %   MCV 94.8  78.0 - 100.0 fL   MCH 32.3  26.0 - 34.0 pg   MCHC 34.1  30.0 - 36.0 g/dL   RDW 11.9  14.7 - 82.9 %   Platelets 154  150 - 400 K/uL  BASIC METABOLIC PANEL     Status: Abnormal   Collection Time    08/14/12  7:10 AM      Result Value Range   Sodium 137  135 - 145 mEq/L   Potassium 4.4  3.5 - 5.1 mEq/L   Chloride 100  96 - 112 mEq/L   CO2 26  19 - 32 mEq/L   Glucose, Bld 142 (*) 70 - 99 mg/dL   BUN 14  6 - 23 mg/dL   Creatinine, Ser 5.62  0.50 - 1.10 mg/dL   Calcium 13.0  8.4 - 86.5 mg/dL   GFR calc non Af Amer >90  >90 mL/min   GFR calc Af Amer >90  >90 mL/min    Signed: Sara Chu D 08/14/2012, 10:51 AM   Time Spent on Discharge: 35 minutes Services Ordered on Discharge: None Equipment Ordered on Discharge: None

## 2012-08-14 NOTE — Progress Notes (Signed)
Visit to patient while in hospital. Will call after she is discharged to assist with transition of care. 

## 2012-08-14 NOTE — Progress Notes (Signed)
Subjective: She notes that her dizziness and diplopia are present more so at the end of her walks with PT and later in the day. She has mild SOB with walking but none today.   She has nasal congestion that is improving with saline nasal spray. She reports history of allergic rhinitis and sinusitis.   She denies headache, chest pain, cough, or rhinorrhea.   Objective: Vital signs in last 24 hours: Filed Vitals:   08/13/12 1426 08/13/12 2100 08/14/12 0512 08/14/12 0826  BP: 138/67 152/73 147/94 154/75  Pulse: 80 73 79   Temp: 98.5 F (36.9 C) 98.1 F (36.7 C) 97.3 F (36.3 C)   TempSrc: Oral Oral Oral   Resp: 20 18 19    Height:      Weight:   197 lb 1.5 oz (89.4 kg)   SpO2: 96% 97% 98%    Weight change: 0 lb (0 kg)  Intake/Output Summary (Last 24 hours) at 08/14/12 0903 Last data filed at 08/14/12 0429  Gross per 24 hour  Intake   1080 ml  Output   2200 ml  Net  -1120 ml   Vitals reviewed.  General: resting in bed, in NAD  HEENT: PERRL, EOM: splaying of eyes with upward gaze (no left eye deviation as noted on exam yesterday).  Cardiac: RRR, no rubs, murmurs or gallops  Pulm: clear to auscultation bilaterally, no wheezes, rales, or rhonchi  Abd: soft, nontender, nondistended, BS present  Ext: warm and well perfused, no pedal edema  Neuro: alert and oriented X3, CN II-XII grossly intact except for possible CN III dysfunction due to unconjugated upward gaze.  Lab Results: Basic Metabolic Panel:  Recent Labs Lab 08/13/12 0924 08/14/12 0710  NA 135 137  K 5.0 4.4  CL 100 100  CO2 25 26  GLUCOSE 204* 142*  BUN 13 14  CREATININE 0.77 0.70  CALCIUM 10.0 10.2   Liver Function Tests:  Recent Labs Lab 08/11/12 2209 08/12/12 0530  AST 36 18  ALT 21 22  ALKPHOS 49 55  BILITOT 0.3 0.3  PROT 6.7 6.4  ALBUMIN 3.3* 3.1*   CBC:  Recent Labs Lab 08/13/12 0924 08/14/12 0710  WBC 6.6 4.7  HGB 13.7 13.0  HCT 39.4 38.1  MCV 95.4 94.8  PLT 209 154   Cardiac  Enzymes:  Recent Labs Lab 08/11/12 2209 08/12/12 0530  TROPONINI <0.30 <0.30   BNP:  Recent Labs Lab 08/11/12 1431  PROBNP 258.0*   D-Dimer:  Recent Labs Lab 08/11/12 1431  DDIMER 0.61*   CBG:  Recent Labs Lab 08/12/12 2121 08/13/12 0549 08/13/12 1111 08/13/12 1630 08/13/12 2209 08/14/12 0554  GLUCAP 155* 126* 164* 145* 157* 158*   Urine Drug Screen: Drugs of Abuse     Component Value Date/Time   LABOPIA NONE DETECTED 08/11/2012 2031   COCAINSCRNUR NONE DETECTED 08/11/2012 2031   LABBENZ NONE DETECTED 08/11/2012 2031   AMPHETMU NONE DETECTED 08/11/2012 2031   THCU NONE DETECTED 08/11/2012 2031   LABBARB NONE DETECTED 08/11/2012 2031    Studies/Results: Mr Laqueta Jean Wo Contrast  08/12/2012  *RADIOLOGY REPORT*  Clinical Data: Diabetic hypertensive patient presenting with headache, diplopia and dizziness.  Evaluate for cerebellar stroke.  MRI HEAD WITHOUT AND WITH CONTRAST  Technique:  Multiplanar, multiecho pulse sequences of the brain and surrounding structures were obtained according to standard protocol without and with intravenous contrast  Contrast: 20mL MULTIHANCE GADOBENATE DIMEGLUMINE 529 MG/ML IV SOLN  Comparison: None.  Findings: No acute infarct.  Prominent  white matter type changes not only involving the supratentorial region but also with prominent involvement of the pons most consistent with result of small vessel disease given the patient's history.  No intracranial hemorrhage.  No intracranial mass or abnormal enhancement.  Partially empty sella.  This may be an incidental finding although also seen in patients with pseudotumor cerebri.  Mild exophthalmos.  Probable polypoid lesion rather than fluid level left maxillary sinus.  Mild polypoid opacification inferior right maxillary sinus.  IMPRESSION: No acute infarct.  Prominent small vessel disease type changes as noted above.  No intracranial mass or abnormal enhancement.  Partially empty sella.  This may be an  incidental finding although also seen in patients with pseudotumor cerebri.  Mild exophthalmos.  Probable polypoid lesion rather than fluid level left maxillary sinus.  Mild polypoid opacification inferior right maxillary sinus.   Original Report Authenticated By: Lacy Duverney, M.D.    Medications: I have reviewed the patient's current medications. Scheduled Meds: . aspirin EC  81 mg Oral Daily  . DULoxetine  60 mg Oral Daily  . gabapentin  600 mg Oral TID  . heparin  5,000 Units Subcutaneous Q8H  . insulin aspart  0-9 Units Subcutaneous TID WC  . lisinopril  20 mg Oral Daily  . meclizine  12.5 mg Oral BID  . metoprolol  50 mg Oral BID  . niacin  500 mg Oral BID WC  . sodium chloride  3 mL Intravenous Q12H   Continuous Infusions:  PRN Meds:.HYDROcodone-acetaminophen, nystatin, sodium chloride Assessment/Plan: Dizziness. Persistent, described as an imbalance that is worse at the end of the day. Etiology to include vertigo (although no vertiginous symptoms), cerebellar stroke (MRI: pons small vessel disease but no obvious acute stroke), diabetic peripheral neuropathy, decreased proprioception. ACS less likely with troponin negative x3, no chest pain (though she has DM2), and no ST elevation. UDS negative. Neurology consulted with no further diagnostic work up recommended at this time but outpatient follow up. PT evaluated with no HH recommendation.  -Carotid doppler b/l normal  -2D echo with grade 2 diastolic CHF, EF 16-10% -Discontinue meclizine PRN (no improvement in dizziness)  Shortness of breath: Mild, intermittent, worse with standing. Patient does not carry  Diagnoses of COPD but she is a current everyday smoker. She does have mild sinuitis with nasal congestion. No cough, fever/ chills, or rhinorrhea. 2D echo with grade 2 diastolic dysfunction with preserved EF. She has trace pretibial edema but no pulmonary edema.  -Continue saline nasal spray -Flonase nasal spray daily -Will start  HCTZ 12.5mg  daily given trace edema, CHF, and elevated BP   Diplopia. Intermittent, worse at the end of the day. She reports today having diplopia for almost one year while that has progressively worsened since she arrived in [place]. She was seen by ophthalmologist and was told to change her prescription glasses. MRI brain with no intracranial process to explain diplopia, no obvious EOM defects or changes. Neurology evaluated with improved EOM function today. Etiology unclear (perhaps Myasthenia Gravis?) may need further work up as outpatient -Will need ophthalmology referral as outpatient  -Will need Neurology follow up as outpatient   Migraine headaches. Improved with Norco. Holding Fiorecet for now. No obvious intracranial process noted on MRI.   Hypertension. BP elevated to 154/75 this morning. She reports being on HCTZ in the past as part of her CHF tx.  -Continue lisinopril and metoprolol, will consider going up on lisinopril dose  -Start HCTZ 12.5mg  daily  Prolonged QTc. Resolved. EKG  on presentation with QTc of 571. Previous EKG with QTc of 467. This was thought to be secondary to flexeril use and has resolved now with discontinuation of this medications. Repeat EKG with QTc of 444. Updated Allergies with contraindication for flexeril. Pt counseled to avoid flexeril and likely TCAs.  -Discontinued flexeril  -Decreased Cymbalta to 60mg  daily  -Telemetry   DM2. Hgb A1C on 05/13/12 at 8.3. She is on metformin 1000mg  BID at home. CBGs in 100s overnight. -Hold metformin while inpatient setting  -SSI   VTE prophylaxis. Heparin TID   FEN  NSL  Electrolyte repletion as needed  Carb mod diet   Dispo: Disposition is deferred at this time, awaiting improvement of current medical problems. Anticipated discharge is today.  The patient does have a current PCP (SCHOOLER, KAREN, MD), therefore will be requiring OPC follow-up after discharge.  The patient does not have transportation limitations  that hinder transportation to clinic appointments.  .Services Needed at time of discharge: Y = Yes, Blank = No  PT:    OT:    RN:    Equipment:    Other:        LOS: 3 days   Sara Chu D 08/14/2012, 9:03 AM

## 2012-08-14 NOTE — Progress Notes (Signed)
Physical Therapy Treatment Patient Details Name: [name] MRN: [id number] DOB: December 28, 1950 Today's Date: 08/14/2012 Time: 4098-1191 PT Time Calculation (min): 27 min  PT Assessment / Plan / Recommendation Comments on Treatment Session  62 y/o female adm. for dizziness. Denied dizziness with visual scanning and VOR as well as rolling in bed and dix hall pike, did not see any nystagmus with any of these. She did report dizziness when she sped up during DGI and with looking up at ceiling. She describes this dizziness as feeling "off" and says it doesn't feel like the room is spinning. She reports that she does have an ear infection of some sort so I wonder if some of the swelling/pressure could be causing vestibular symptoms. Complains of dizziness and chills following ambulation, 2/10. Denies nausea. BP before ambulation 154/75 and 147/74 following ambulation. Pt is very safe and functional with mobility at this time demonstrating good safety awareness. Noted she is following up with neurology and opthamology as an outpatient due to diplopia which she reports has been going on for over a month. Will continue to follow while in acute setting to maximize safety for d/c home.     Follow Up Recommendations  No PT follow up;Supervision - Intermittent     Does the patient have the potential to tolerate intense rehabilitation     Barriers to Discharge        Equipment Recommendations  None recommended by PT    Recommendations for Other Services    Frequency     Plan Discharge plan remains appropriate;Frequency remains appropriate    Precautions / Restrictions Precautions Precautions: Fall Restrictions Weight Bearing Restrictions: No   Pertinent Vitals/Pain BP prior to ambulation (no dizziness): 154/75 BP after ambulation (reports of 2/10 dizziness): 147/74    Mobility  Bed Mobility Bed Mobility: Rolling Right;Right Sidelying to Sit Rolling Right: 7: Independent Right Sidelying to  Sit: 7: Independent Transfers Transfers: Sit to Stand;Stand to Sit Sit to Stand: 7: Independent Stand to Sit: 7: Independent Ambulation/Gait Ambulation/Gait Assistance: 5: Supervision Ambulation Distance (Feet): 400 Feet Assistive device: None Ambulation/Gait Assistance Details: adequate gait speed but still cautious, still occasionally reaches out for environmental supports however this is more ofen in her room in tight spaces Gait Pattern: Step-through pattern Stairs: Yes Stairs Assistance: 6: Modified independent (Device/Increase time) Stair Management Technique: One rail Right;Step to pattern Number of Stairs: 3      PT Goals Acute Rehab PT Goals PT Goal: Ambulate - Progress: Progressing toward goal PT Goal: Up/Down Stairs - Progress: Met  Visit Information  Last PT Received On: 08/14/12 Assistance Needed: +1    Subjective Data  Subjective: I get dizzy when I get done walking. Patient Stated Goal: home   Cognition  Cognition Overall Cognitive Status: Appears within functional limits for tasks assessed/performed Arousal/Alertness: Awake/alert Orientation Level: Appears intact for tasks assessed Behavior During Session: [facility] for tasks performed    Balance  Standardized Balance Assessment Standardized Balance Assessment: Dynamic Gait Index Dynamic Gait Index Level Surface: Normal Change in Gait Speed: Normal (reports dizziness when she sped up) Gait with Horizontal Head Turns: Mild Impairment Gait with Vertical Head Turns: Mild Impairment Gait and Pivot Turn: Normal Step Over Obstacle: Normal Step Around Obstacles: Normal Steps: Moderate Impairment Total Score: 20  End of Session PT - End of Session Equipment Utilized During Treatment: Gait belt Activity Tolerance: Patient tolerated treatment well Patient left: in chair;with call bell/phone within reach Nurse Communication: Mobility status   GP  WHITLOW,Eldrige Pitkin HELEN 08/14/2012, 9:08 AM

## 2012-08-14 NOTE — Plan of Care (Signed)
Problem: Phase I Progression Outcomes Goal: Initial discharge plan identified Outcome: Completed/Met Date Met:  08/14/12 Initial plan of DC is to return home with family  Problem: Phase II Progression Outcomes Goal: Progress activity as tolerated unless otherwise ordered Outcome: Completed/Met Date Met:  08/14/12 Pt ambulated in hallway, tolerated well

## 2012-08-20 ENCOUNTER — Ambulatory Visit (INDEPENDENT_AMBULATORY_CARE_PROVIDER_SITE_OTHER): Payer: BC Managed Care – PPO | Admitting: Internal Medicine

## 2012-08-20 ENCOUNTER — Telehealth: Payer: Self-pay | Admitting: Dietician

## 2012-08-20 ENCOUNTER — Encounter: Payer: Self-pay | Admitting: Internal Medicine

## 2012-08-20 VITALS — BP 118/79 | HR 76 | Temp 98.6°F | Ht 66.0 in | Wt 196.9 lb

## 2012-08-20 DIAGNOSIS — E1149 Type 2 diabetes mellitus with other diabetic neurological complication: Secondary | ICD-10-CM

## 2012-08-20 DIAGNOSIS — H532 Diplopia: Secondary | ICD-10-CM

## 2012-08-20 DIAGNOSIS — E1059 Type 1 diabetes mellitus with other circulatory complications: Secondary | ICD-10-CM

## 2012-08-20 DIAGNOSIS — E1142 Type 2 diabetes mellitus with diabetic polyneuropathy: Secondary | ICD-10-CM

## 2012-08-20 DIAGNOSIS — E1140 Type 2 diabetes mellitus with diabetic neuropathy, unspecified: Secondary | ICD-10-CM

## 2012-08-20 DIAGNOSIS — R42 Dizziness and giddiness: Secondary | ICD-10-CM

## 2012-08-20 DIAGNOSIS — I10 Essential (primary) hypertension: Secondary | ICD-10-CM

## 2012-08-20 DIAGNOSIS — E119 Type 2 diabetes mellitus without complications: Secondary | ICD-10-CM

## 2012-08-20 LAB — BASIC METABOLIC PANEL WITH GFR
BUN: 13 mg/dL (ref 6–23)
Calcium: 10.4 mg/dL (ref 8.4–10.5)
Creat: 0.66 mg/dL (ref 0.50–1.10)
GFR, Est African American: >89 mL/min
GFR, Est Non African American: >89 mL/min
Potassium: 4.1 mEq/L (ref 3.5–5.3)

## 2012-08-20 LAB — POCT GLYCOSYLATED HEMOGLOBIN (HGB A1C): Hemoglobin A1C: 6.7

## 2012-08-20 MED ORDER — PREGABALIN 50 MG PO CAPS: 50.0000 mg | ORAL_CAPSULE | Freq: Three times a day (TID) | ORAL | Status: DC

## 2012-08-20 MED ORDER — HYDROCODONE-ACETAMINOPHEN 5-325 MG PO TABS: 1.0000 | ORAL_TABLET | Freq: Four times a day (QID) | ORAL | Status: DC | PRN

## 2012-08-20 MED ORDER — HYDROCODONE-ACETAMINOPHEN 5-325 MG PO TABS: 1.0000 | ORAL_TABLET | Freq: Every evening | ORAL | Status: DC | PRN

## 2012-08-20 NOTE — Patient Instructions (Signed)
We will try you on lyrica which is like gabapentin. Be careful when starting this medicine as it may make you sleepy especially if you are still taking the gabapentin as well. The lyrica can be taken 1 pill up to 3 times per day.   Your diabetes is doing well and no changes to that today. We will send you to the eye doctor and the neurologist.   Come back in 3 months for a check up. Our number is [phone].

## 2012-08-20 NOTE — Progress Notes (Signed)
Subjective:     Patient ID: [name], female   DOB: 03/24/1951, 62 y.o.   MRN: [id number]  HPI The patient is a 62 YO female who comes in today for a hospital follow up. She was hospitalized for dizziness of unknown etiology. She did have some work up in the hospital including MRI brain and CT head. She was trialed on meclizine with no improvement so this was stopped. She states that since leaving the hospital she has had less dizziness although when standing for prolonged periods of times she has this sensation. She states that she does not check her sugars at home and that she sometimes does not eat except one meal per day. She alternates this (not every other day) with eating throughout the day all day long. Her weights are stable per her. She is still having some neuropathic pain in her right foot and states that she is unable to feel either of her feet. She continues to have diplopia and would like to see an eye doctor. She states that she has not fallen since leaving the hospital however the past several years she has fallen occasionally. She's never seriously injured herself when falling. She is not having any chest pain, shortness of breath, nausea, vomiting, diarrhea. She is not having any problems with any of her medications including HCTZ which is a new medication since leaving the hospital. She has also had a decrease in her dose of Cymbalta and now takes 60 mg daily.   Review of Systems  Constitutional: Negative for fever, chills, diaphoresis, activity change, appetite change, fatigue and unexpected weight change.  HENT: Negative.   Eyes: Negative.   Respiratory: Negative for cough, chest tightness, shortness of breath and wheezing.   Cardiovascular: Negative for chest pain, palpitations and leg swelling.  Gastrointestinal: Negative for nausea, vomiting, abdominal pain, diarrhea and constipation.  Endocrine: Negative.   Genitourinary: Negative.   Musculoskeletal: Positive for  myalgias and arthralgias. Negative for back pain, joint swelling and gait problem.  Skin: Negative for color change, pallor, rash and wound.  Neurological: Positive for dizziness and light-headedness. Negative for tremors, seizures, syncope, facial asymmetry, speech difficulty, weakness, numbness and headaches.  Hematological: Negative.   Psychiatric/Behavioral: Negative.        Objective:   Physical Exam  Constitutional: She is oriented to person, place, and time. She appears well-developed and well-nourished.  Obese  HENT:  Head: Normocephalic and atraumatic.  Eyes: EOM are normal. Pupils are equal, round, and reactive to light.  Neck: Normal range of motion. Neck supple.  Cardiovascular: Normal rate and regular rhythm.   Pulmonary/Chest: Effort normal and breath sounds normal. No respiratory distress. She has no wheezes. She has no rales.  Abdominal: Soft. Bowel sounds are normal. She exhibits no distension. There is no tenderness. There is no rebound.  Musculoskeletal: Normal range of motion. She exhibits tenderness. She exhibits no edema.  Tenderness in her feet with walking.  Neurological: She is alert and oriented to person, place, and time. No cranial nerve deficit. Coordination normal.  Skin: Skin is warm and dry. No rash noted. No erythema. No pallor.       Assessment/Plan:   1. Hospital follow up - Dizziness is somewhat better. May be related to eating/not eating or prolonged periods of standing. Advised her to be careful when ambulating if she is feeling dizzy. Advised her to eat regular meals and to start the day with breakfast. Initially in the hospital had prolonged QT interval however this  resolved with stopping Flexeril and will keep her off Flexeril. In the future if she is having low heart rate could decrease metoprolol however heart rate was 76 at today's visit.  2. Please see problem oriented charting.  3. Hypercalcemia - Noted hypercalcemia at last visit and has  not been addressed. Will check PTH and ionized calcium at today's visit.   4. Disposition - Will start patient on lyrica for improved pain control in her feet. She'll be seen back in 3 months for check of her diabetes. Referral for ophthalmology and neurology at this visit. Refill of Vicodin to be taken at bedtime only one to 2 tabs #60 no refills. Did get a BMP at today's visit, ionized calcium, parathyroid hormone intact, hemoglobin A1c.

## 2012-08-20 NOTE — Telephone Encounter (Signed)
Discharge date:08-14-12 Call date: 08-20-12 Hospital follow up appointment date: 08-20-12 with dr. Dorise Hiss  Calling to assist with transition of care from hospital to home. Discharge medications reviewed: NO Able to fill all prescriptions?YES Patient aware of hospital follow up appointments. YES No problems with transportation. NO Other problems/concerns:NO

## 2012-08-20 NOTE — Assessment & Plan Note (Signed)
HgA1c 6.7 at today's visit and will keep medications the same with metformin 1000 mg BID. Kidney function normal with last CR 0.7. Patient states she has had pneumonia shot in [place] in the past.

## 2012-08-20 NOTE — Assessment & Plan Note (Signed)
Blood pressure controlled, just started HCTZ in the hospital and will recheck a BMP at today's visit. Will recheck in 3 months. Continue HCTZ, lisinopril, metoprolol.

## 2012-08-21 LAB — PTH, INTACT AND CALCIUM: PTH: 18.9 pg/mL (ref 14.0–72.0)

## 2012-09-18 ENCOUNTER — Encounter: Payer: Self-pay | Admitting: Internal Medicine

## 2012-10-22 ENCOUNTER — Other Ambulatory Visit: Payer: Self-pay | Admitting: *Deleted

## 2012-10-22 DIAGNOSIS — E119 Type 2 diabetes mellitus without complications: Secondary | ICD-10-CM

## 2012-10-23 NOTE — Telephone Encounter (Signed)
Talked with pt why pain med was denied and is willing to sch appt. Pt talked with CLissa Hoard - does not have money for co payment this month.

## 2012-10-29 ENCOUNTER — Encounter: Payer: BC Managed Care – PPO | Admitting: Internal Medicine

## 2012-11-21 ENCOUNTER — Encounter: Payer: Self-pay | Admitting: Dietician

## 2012-12-07 ENCOUNTER — Other Ambulatory Visit: Payer: Self-pay | Admitting: *Deleted

## 2012-12-07 DIAGNOSIS — E119 Type 2 diabetes mellitus without complications: Secondary | ICD-10-CM

## 2012-12-09 ENCOUNTER — Emergency Department (HOSPITAL_COMMUNITY)
Admission: EM | Admit: 2012-12-09 | Discharge: 2012-12-09 | Disposition: A | Discharge status: Home / Self Care | Payer: BC Managed Care – PPO | Attending: Emergency Medicine | Admitting: Emergency Medicine

## 2012-12-09 ENCOUNTER — Encounter (HOSPITAL_COMMUNITY): Payer: Self-pay | Admitting: Emergency Medicine

## 2012-12-09 DIAGNOSIS — E781 Pure hyperglyceridemia: Secondary | ICD-10-CM | POA: Insufficient documentation

## 2012-12-09 DIAGNOSIS — E119 Type 2 diabetes mellitus without complications: Secondary | ICD-10-CM | POA: Insufficient documentation

## 2012-12-09 DIAGNOSIS — Y929 Unspecified place or not applicable: Secondary | ICD-10-CM | POA: Insufficient documentation

## 2012-12-09 DIAGNOSIS — Y939 Activity, unspecified: Secondary | ICD-10-CM | POA: Insufficient documentation

## 2012-12-09 DIAGNOSIS — Z7982 Long term (current) use of aspirin: Secondary | ICD-10-CM | POA: Insufficient documentation

## 2012-12-09 DIAGNOSIS — Z79899 Other long term (current) drug therapy: Secondary | ICD-10-CM | POA: Insufficient documentation

## 2012-12-09 DIAGNOSIS — I10 Essential (primary) hypertension: Secondary | ICD-10-CM | POA: Insufficient documentation

## 2012-12-09 DIAGNOSIS — Z8669 Personal history of other diseases of the nervous system and sense organs: Secondary | ICD-10-CM | POA: Insufficient documentation

## 2012-12-09 DIAGNOSIS — J4489 Other specified chronic obstructive pulmonary disease: Secondary | ICD-10-CM | POA: Insufficient documentation

## 2012-12-09 DIAGNOSIS — G8929 Other chronic pain: Secondary | ICD-10-CM | POA: Insufficient documentation

## 2012-12-09 DIAGNOSIS — W57XXXA Bitten or stung by nonvenomous insect and other nonvenomous arthropods, initial encounter: Secondary | ICD-10-CM

## 2012-12-09 DIAGNOSIS — J449 Chronic obstructive pulmonary disease, unspecified: Secondary | ICD-10-CM | POA: Insufficient documentation

## 2012-12-09 DIAGNOSIS — F17200 Nicotine dependence, unspecified, uncomplicated: Secondary | ICD-10-CM | POA: Insufficient documentation

## 2012-12-09 DIAGNOSIS — I509 Heart failure, unspecified: Secondary | ICD-10-CM | POA: Insufficient documentation

## 2012-12-09 DIAGNOSIS — IMO0002 Reserved for concepts with insufficient information to code with codable children: Secondary | ICD-10-CM | POA: Insufficient documentation

## 2012-12-09 DIAGNOSIS — S90569A Insect bite (nonvenomous), unspecified ankle, initial encounter: Secondary | ICD-10-CM | POA: Insufficient documentation

## 2012-12-09 MED ORDER — DOXYCYCLINE HYCLATE 100 MG PO CAPS: 100.0000 mg | ORAL_CAPSULE | Freq: Two times a day (BID) | ORAL | Status: DC

## 2012-12-09 MED ORDER — DOXYCYCLINE HYCLATE 100 MG PO TABS
100.0000 mg | ORAL_TABLET | Freq: Once | ORAL | Status: AC
  Administered 2012-12-09: 100 mg via ORAL
  Filled 2012-12-09: qty 1

## 2012-12-09 MED ORDER — ONDANSETRON HCL 4 MG PO TABS: 4.0000 mg | ORAL_TABLET | Freq: Four times a day (QID) | ORAL | Status: DC

## 2012-12-09 MED ORDER — PREGABALIN 50 MG PO CAPS: 50.0000 mg | ORAL_CAPSULE | Freq: Three times a day (TID) | ORAL | Status: DC

## 2012-12-09 NOTE — ED Provider Notes (Signed)
Medical screening examination/treatment/procedure(s) were performed by non-physician practitioner and as supervising physician I was immediately available for consultation/collaboration.   Gavin Pound. Khara Renaud, MD 12/09/12 1740

## 2012-12-09 NOTE — ED Notes (Signed)
Pt discharged to home with daughter. NAD.  

## 2012-12-09 NOTE — ED Provider Notes (Signed)
History    CSN: [id number] Arrival date & time 12/09/12  1427  First MD Initiated Contact with Patient 12/09/12 1702     Chief Complaint  Patient presents with  . Insect Bite  . Illness   (Consider location/radiation/quality/duration/timing/severity/associated sxs/prior Treatment) HPI  62 year old non-insulin-dependent diabetes, chronic pain, neuromuscular disorder presents for evaluations of insect bite. patient reports she has been noticing small insect bites throughout her lower legs ongoing for the past 2 weeks. States the bites are itchy, tender to the touch, and has been appearing with increased frequency. Reports feeling tired, fatigued, with decrease in appetite. Has no desire to eat or drink for the past several days.  has chronic headache but denies any worsening headache. Denies fever, neck stiffness, chest pain, shortness of breath, sneezing, coughing, abdominal pain, back pain. Denies dysuria, hematuria, numbness or weakness. Patient has been working out in the yard. Patient has pets. Patient does not think she was exposed to tick bite. Denies any recent medication changes. Patient has been drinking Gatorade stay hydrated despite not feeling thirsty.denies any other environmental changes including no recent change in soap or detergent.  Past Medical History  Diagnosis Date  . Hypertension   . Diabetes mellitus without complication     diagnosed around 2010, only ever on metformin  . Chronic pain     neck pain, headache, neuropathy  . Hypertriglyceridemia   . Neuromuscular disorder   . COPD (chronic obstructive pulmonary disease)   . CHF (congestive heart failure)    Past Surgical History  Procedure Laterality Date  . Rotator cuff repair    . Carpal tunnel release    . Abdominal hysterectomy    . Cholecystectomy     No family history on file. History  Substance Use Topics  . Smoking status: Current Every Day Smoker -- 0.25 packs/day for 41 years    Types:  Cigarettes  . Smokeless tobacco: Never Used     Comment: not interested in trying to stop now - husband just died in 2022-12-22  . Alcohol Use: No   OB History   Grav Para Term Preterm Abortions TAB SAB Ect Mult Living                 Review of Systems  All other systems reviewed and are negative.    Allergies  Flexeril and Soma  Home Medications   Current Outpatient Rx  Name  Route  Sig  Dispense  Refill  . aspirin EC 81 MG EC tablet   Oral   Take 1 tablet (81 mg total) by mouth daily.         . DULoxetine (CYMBALTA) 60 MG capsule   Oral   Take 1 capsule (60 mg total) by mouth daily.   30 capsule   0   . fluticasone (FLONASE) 50 MCG/ACT nasal spray   Nasal   Place 2 sprays into the nose daily.   16 g   2   . gabapentin (NEURONTIN) 600 MG tablet   Oral   Take 1 tablet (600 mg total) by mouth 3 (three) times daily. 3qam 2 noon 3qhs   90 tablet   5   . hydrochlorothiazide (MICROZIDE) 12.5 MG capsule   Oral   Take 1 capsule (12.5 mg total) by mouth daily.   30 capsule   0   . HYDROcodone-acetaminophen (NORCO/VICODIN) 5-325 MG per tablet   Oral   Take 1-2 tablets by mouth at bedtime as needed.   60 tablet  0   . lisinopril (PRINIVIL,ZESTRIL) 20 MG tablet   Oral   Take 1 tablet (20 mg total) by mouth daily.   30 tablet   5   . metFORMIN (GLUCOPHAGE) 500 MG tablet   Oral   Take 2 tablets (1,000 mg total) by mouth 2 (two) times daily with a meal.   120 tablet   5   . metoprolol (LOPRESSOR) 50 MG tablet   Oral   Take 1 tablet (50 mg total) by mouth 2 (two) times daily.   60 tablet   11   . niacin (SLO-NIACIN) 500 MG tablet   Oral   Take 500 mg by mouth 2 (two) times daily with a meal.         . nystatin (MYCOSTATIN/NYSTOP) 100000 UNIT/GM POWD      Apply to the affected areas daily or as needed after bathing.   30 g   0   . pregabalin (LYRICA) 50 MG capsule   Oral   Take 1 capsule (50 mg total) by mouth 3 (three) times daily.   90 capsule    2   . sodium chloride (OCEAN) 0.65 % SOLN nasal spray   Nasal   Place 1 spray into the nose as needed for congestion.   1 Bottle       BP 160/83  Pulse 82  Temp(Src) 98.1 F (36.7 C) (Oral)  Resp 18  SpO2 99% Physical Exam  Nursing note and vitals reviewed. Constitutional: She is oriented to person, place, and time. She appears well-developed and well-nourished. No distress.  HENT:  Head: Atraumatic.  Mouth/Throat: Oropharynx is clear and moist.  Eyes: Conjunctivae are normal.  Neck: Neck supple.  Cardiovascular: Normal rate and regular rhythm.   Pulmonary/Chest: Effort normal and breath sounds normal.  Abdominal: Soft. There is no tenderness.  Musculoskeletal: She exhibits no edema.  No joints tenderness.  Normal strength throughout  Neurological: She is alert and oriented to person, place, and time.  5/5 strength to all 4 extremities.  Skin: Skin is warm.  BLE:  Several small localized skin lesions with scabs throughout lower extremities resembling insect bites.  No obvious abscess or cellulitis.       ED Course  Procedures (including critical care time)  Patient is concerning for insect bites throughout her lower legs. No obvious cellulitis or abscess noted. Patient also has been feeling fatigued with decreased appetite. She is afebrile with stable normal vital sign. Oral mucosa moist. Normal strength throughout body able to ambulate without difficulty.  Pt able to tolerates PO.  Therefore, plan to treat insect bite with doxycycline for possible skin infection or tick bite, although doubt RMSF or lyme disease.   Pt will f/u with PCP for further care.  Return precaution discussed.  Pt voice understanding and agrees with plan.  Option of obtaining labs, UA and also to check for electrolytes abnormalities were mentioned by me, pt decline at this time.    Labs Reviewed  CBC WITH DIFFERENTIAL  COMPREHENSIVE METABOLIC PANEL  URINALYSIS, ROUTINE W REFLEX MICROSCOPIC   No  results found. 1. Insect bite     MDM  BP 160/83  Pulse 82  Temp(Src) 98.1 F (36.7 C) (Oral)  Resp 18  SpO2 99%   Fayrene Helper, PA-C 12/09/12 1733

## 2012-12-09 NOTE — ED Notes (Signed)
Pt reports multiple insect bites on her legs. Pt also reports not feeling well, reports decrease appetite and fatigue. Pt has been out in her yard.

## 2012-12-10 ENCOUNTER — Other Ambulatory Visit: Payer: Self-pay | Admitting: *Deleted

## 2012-12-10 MED ORDER — GABAPENTIN 600 MG PO TABS: 600.0000 mg | ORAL_TABLET | Freq: Three times a day (TID) | ORAL | Status: DC

## 2012-12-11 NOTE — Telephone Encounter (Signed)
Rx called in to pharmacy. Jahni Randle Shatzer RN 12/11/12 2:45PM

## 2012-12-12 ENCOUNTER — Encounter: Payer: Self-pay | Admitting: Internal Medicine

## 2012-12-12 ENCOUNTER — Ambulatory Visit (INDEPENDENT_AMBULATORY_CARE_PROVIDER_SITE_OTHER): Payer: BC Managed Care – PPO | Admitting: Internal Medicine

## 2012-12-12 VITALS — BP 112/64 | HR 95 | Temp 98.2°F | Ht 66.0 in | Wt 200.3 lb

## 2012-12-12 DIAGNOSIS — E1149 Type 2 diabetes mellitus with other diabetic neurological complication: Secondary | ICD-10-CM

## 2012-12-12 DIAGNOSIS — W57XXXA Bitten or stung by nonvenomous insect and other nonvenomous arthropods, initial encounter: Secondary | ICD-10-CM

## 2012-12-12 DIAGNOSIS — I10 Essential (primary) hypertension: Secondary | ICD-10-CM

## 2012-12-12 DIAGNOSIS — Z0000 Encounter for general adult medical examination without abnormal findings: Secondary | ICD-10-CM

## 2012-12-12 DIAGNOSIS — E1140 Type 2 diabetes mellitus with diabetic neuropathy, unspecified: Secondary | ICD-10-CM

## 2012-12-12 DIAGNOSIS — T148 Other injury of unspecified body region: Secondary | ICD-10-CM

## 2012-12-12 DIAGNOSIS — F329 Major depressive disorder, single episode, unspecified: Secondary | ICD-10-CM

## 2012-12-12 DIAGNOSIS — E1165 Type 2 diabetes mellitus with hyperglycemia: Secondary | ICD-10-CM

## 2012-12-12 DIAGNOSIS — E1142 Type 2 diabetes mellitus with diabetic polyneuropathy: Secondary | ICD-10-CM

## 2012-12-12 DIAGNOSIS — Z1239 Encounter for other screening for malignant neoplasm of breast: Secondary | ICD-10-CM

## 2012-12-12 LAB — GLUCOSE, CAPILLARY: Glucose-Capillary: 172 mg/dL — ABNORMAL HIGH (ref 70–99)

## 2012-12-12 LAB — POCT GLYCOSYLATED HEMOGLOBIN (HGB A1C): Hemoglobin A1C: 7.4

## 2012-12-12 MED ORDER — DULOXETINE HCL 30 MG PO CPEP: ORAL_CAPSULE | ORAL | Status: DC

## 2012-12-12 MED ORDER — HYDROXYZINE HCL 10 MG PO TABS: 10.0000 mg | ORAL_TABLET | Freq: Three times a day (TID) | ORAL | Status: DC | PRN

## 2012-12-12 NOTE — Progress Notes (Signed)
Subjective:   Patient ID: [name] female   DOB: 01-21-1951 62 y.o.   MRN: [id number]  HPI: [name] is a 62 y.o. female with PMH of DM2 and HTN presenting to clinic today for ED follow up visit.  She was last seen in the ED on 12/09/12 for complaints of lower extremity bug bites with itching and feeling tired and fatigued with decreased appetite.  She refused any labarotary testing at that time and was discharged on doxycycline for 10 days for possible skin infection or tick bite.   Since her ED visit, she reports improvement in her symptoms since starting doxycycline.  She claims she started 12/09/12, today is day 4.  She reports occasional loose BM since on the doxycycline but says her itching has slightly improved and no more nausea, sweats, and slightly improved appetite.  Although, she reports her loss of appetite has been chronic since moving to [place] after the death of her husband last 01/02/23.  While talking about her husband, she started crying and saying she has been very depressed.  She was previously on cymbalta but that was stopped on her last hospitalization and never restarted.  Since then she has been not been sleeping much, has very little interest and energy, and decreased appetite.  She denies guilt, trouble concentrating, or any suicidal or homicidal ideations.    In regards to her itching: she reports bug bites on b/l lower extremities, that she continues to scratch and now the lesions have fry scabs on them.  The "bites" are not present anywhere else on her body.  She says she notices it after she walks in her grass and at night.  She denies any oozing or bleeding from the sites.  She does endorse prior hx of MRSA before in [place] when her husband was sick.  The bug bites started approximately 2 weeks prior and were initially associated with nausea and intermittent sweating since then resolved.  She has 3 dogs at home but does not think they have fleas and she  has not seem them itching.  She does live with her daughter and grand children and reports their her daughter has a similar bug bite on her chest that is very itchy now and a few bites in her abdominal region.  She does not believe to have bed bugs and says she does not sleep in her bed, she rests in her recliner which is the only position comfortable for her chronic neck pain.    In regards to health maintenance; she is due for a mammogram and we will refer today.  She claims she had a colonoscopy done in [place] 3 years ago at [facility] with Dr. Johnnye Flowers.  We will need to get records.  Her Hba1c is slightly increased today to 7.4 from 6.7, she says that is because she stopped taking her metformin since she was not eating much.  She continues to smoke cigarettes but has cut down but not ready to quit yet.  Finally, she denies recent weight loss, but says in [place] last year she was 225lbs and has been around 200lbs since she moved to [facility].  She has gained some weight since prior visits in opc.     Past Medical History  Diagnosis Date  . Hypertension   . Diabetes mellitus without complication     diagnosed around 2010, only ever on metformin  . Chronic pain     neck pain, headache, neuropathy  . Hypertriglyceridemia   .  Neuromuscular disorder   . COPD (chronic obstructive pulmonary disease)   . CHF (congestive heart failure)    Current Outpatient Prescriptions  Medication Sig Dispense Refill  . aspirin EC 81 MG EC tablet Take 1 tablet (81 mg total) by mouth daily.      Marland Kitchen doxycycline (VIBRAMYCIN) 100 MG capsule Take 1 capsule (100 mg total) by mouth 2 (two) times daily.  20 capsule  0  . fluticasone (FLONASE) 50 MCG/ACT nasal spray Place 2 sprays into the nose daily.  16 g  2  . gabapentin (NEURONTIN) 600 MG tablet Take 1 tablet (600 mg total) by mouth 3 (three) times daily.  90 tablet  5  . lisinopril (PRINIVIL,ZESTRIL) 20 MG tablet Take 1 tablet (20 mg total) by mouth daily.  30 tablet  5  .  metFORMIN (GLUCOPHAGE) 500 MG tablet Take 500 mg by mouth 2 (two) times daily with a meal.      . metoprolol (LOPRESSOR) 50 MG tablet Take 1 tablet (50 mg total) by mouth 2 (two) times daily.  60 tablet  11  . niacin (SLO-NIACIN) 500 MG tablet Take 500 mg by mouth 2 (two) times daily with a meal.      . nystatin (MYCOSTATIN/NYSTOP) 100000 UNIT/GM POWD Apply to the affected areas daily or as needed after bathing.  30 g  0  . ondansetron (ZOFRAN) 4 MG tablet Take 1 tablet (4 mg total) by mouth every 6 (six) hours.  12 tablet  0  . pregabalin (LYRICA) 50 MG capsule Take 1 capsule (50 mg total) by mouth 3 (three) times daily.  90 capsule  2   No current facility-administered medications for this visit.   No family history on file. History   Social History  . Marital Status: Widowed    Spouse Name: N/A    Number of Children: N/A  . Years of Education: N/A   Social History Main Topics  . Smoking status: Current Every Day Smoker -- 0.25 packs/day for 41 years    Types: Cigarettes  . Smokeless tobacco: Never Used     Comment: not interested in trying to stop now - husband just died in 12-26-2022  . Alcohol Use: No  . Drug Use: No  . Sexually Active: Not on file     Comment: not asked   Other Topics Concern  . Not on file   Social History Narrative   Moved to [facility] from [place] 11-15-13shortly after the death of her husband, to live with her daughter.  She notes significant life stress related to her 43 year-old grand-daughter with bipolar disorder.  She has previously worked as a Associate Professor.   Review of Systems:  Constitutional:  Loss of appetite.  Denies fever, chills, diaphoresis, and fatigue.   HEENT:  Denies congestion, sore throat, rhinorrhea, sneezing, mouth sores, trouble swallowing, neck pain   Respiratory:  Denies SOB, DOE, cough, and wheezing.   Cardiovascular:  Denies palpitations and leg swelling.   Gastrointestinal:  Denies nausea, vomiting, abdominal pain, diarrhea,  constipation, blood in stool and abdominal distention.   Genitourinary:  Denies dysuria, urgency, frequency, hematuria, flank pain and difficulty urinating.   Musculoskeletal:  Denies myalgias, back pain, joint swelling, arthralgias and gait problem.   Skin:  Itching and numerous "bite" marks on legs.    Neurological:  Denies dizziness, seizures, syncope, weakness, light-headedness, numbness and headaches.    Objective:  Physical Exam: Filed Vitals:   12/12/12 0822  Pulse: 95  Temp:  98.2 F (36.8 C)  TempSrc: Oral  Height: 5\' 6"  (1.676 m)  Weight: 200 lb 4.8 oz (90.855 kg)  SpO2: 95%   Vitals reviewed. General: sitting in chair, NAD, occasionally tearful HEENT: PERRL, EOMI, no scleral icterus Cardiac: RRR, no rubs, murmurs or gallops Pulm: clear to auscultation bilaterally, no wheezes, rales, or rhonchi Abd: soft, obese, nontender, nondistended, BS present Ext: warm and well perfused, no pedal edema, +2dp b/l, numerous small raised skin lesions with overlying scab and mild surrounding erythema on b/l lower legs up to level of knee.  No drainage, no visible streaking, or warm to touch.   Neuro: alert and oriented X3, cranial nerves II-XII grossly intact, strength and sensation to light touch equal in bilateral upper and lower extremities Psych: tearful, claims to be depressed. Denies suicidal or homicidal ideation  Assessment & Plan:  Discussed with Dr. Criselda Peaches -?insect bites: complete doxycycline, try vistaril with caution for dizziness -depression--restart cymbalta, 30mg  daily x1 week then increase to 60mg  daily -DM2--restart metformin, encourage regular meal intake -mammogram referral, claims to have had a colonoscopy in [place], need records, optho referral

## 2012-12-12 NOTE — Patient Instructions (Addendum)
General Instructions: Please follow up for your mammogram and eye appointment  Please complete your doxycycline and continue to eat regular meals, if your diarrhea worsens, stop the medicine and call the clinic [phone]  Restart your metformin  We have restarted your cymbalta today, start with 30mg  daily for the first week and can increase to total 60mg  daily after that and follow up with pcp.  If your depression symptoms get worse, call the clinic and speak to pcp right away or if severe, go to ED  Try vistaril to see if that helps with itching, caution with dizziness and if you notice dizziness stop the medication and call the clinic right away  Consider treating your pets for fleas, may not be visible but possible and washing all your clothing and furniture.  Return in 1 month to follow up with PCP.  Please stop smoking! [email or web address]   Treatment Goals:  Goals (1 Years of Data) as of 12/12/12         As of Today 12/09/12 08/20/12 08/14/12 08/14/12     Blood Pressure    . Blood Pressure < 140/90  112/64 160/83 118/79 154/75 147/94     Lifestyle    . Prevent Falls           Result Component    . HEMOGLOBIN A1C < 7.0  7.4  6.7      . LDL CALC < 100            Progress Toward Treatment Goals:  Treatment Goal 12/12/2012  Hemoglobin A1C deteriorated  Blood pressure at goal  Stop smoking smoking less  Prevent falls unable to assess    Self Care Goals & Plans:  Self Care Goal 12/12/2012  Manage my medications take my medicines as prescribed  Monitor my health keep track of my blood glucose  Eat healthy foods -  Be physically active find an activity I enjoy  Stop smoking call [facility] (1-800-QUIT-NOW)  Other -    Home Blood Glucose Monitoring 12/12/2012  Check my blood sugar 3 times a day  When to check my blood sugar before meals    Care Management & Community Referrals:  Referral 05/17/2012  Referrals made for care management support diabetes educator; social worker   Referrals made to community resources other (see comments)     Duloxetine delayed-release capsules What is this medicine? DULOXETINE (doo LOX e teen) is an antidepressant. It is used to treat depression. It is also used to treat different types of chronic pain. This medicine may be used for other purposes; ask your health care provider or pharmacist if you have questions. What should I tell my health care provider before I take this medicine? They need to know if you have any of these conditions: -bipolar disorder or a family history of bipolar disorder -kidney or liver disease -narrow- angle glaucoma -suicidal thoughts or a previous suicide attempt -taken medicines called MAOIs like Carbex, Eldepryl, Marplan, Nardil, and Parnate within 14 days -an unusual reaction to duloxetine, other medicines, foods, dyes, or preservatives -pregnant or trying to get pregnant -breast-feeding How should I use this medicine? Take this medicine by mouth with a glass of water. Follow the directions on the prescription label. Do not cut, crush or chew this medicine. You can take this medicine with or without food. Take your medicine at regular intervals. Do not take your medicine more often than directed. Do not stop taking this medicine suddenly except upon the advice of your doctor. Stopping  this medicine too quickly may cause serious side effects or your condition may worsen. A special MedGuide will be given to you by the pharmacist with each prescription and refill. Be sure to read this information carefully each time. Talk to your pediatrician regarding the use of this medicine in children. Special care may be needed. Overdosage: If you think you have taken too much of this medicine contact a poison control center or emergency room at once. NOTE: This medicine is only for you. Do not share this medicine with others. What if I miss a dose? If you miss a dose, take it as soon as you can. If it is almost  time for your next dose, take only that dose. Do not take double or extra doses. What may interact with this medicine? Do not take this medicine with any of the following medications: -certain diet drugs like dexfenfluramine, fenfluramine -desvenlafaxine -linezolid -MAOIs like Azilect, Carbex, Eldepryl, Marplan, Nardil, and Parnate -methylene blue (intravenous) -milnacipran -thioridazine -venlafaxine This medicine may also interact with the following medications: -alcohol -aspirin and aspirin-like medicines -certain antibiotics like ciprofloxacin and enoxacin -certain medicines for blood pressure, heart disease, irregular heart beat -certain medicines for depression, anxiety, or psychotic disturbances -certain medicines for migraine headache like almotriptan, eletriptan, frovatriptan, naratriptan, rizatriptan, sumatriptan, zolmitriptan -certain medicines that treat or prevent blood clots like warfarin, enoxaparin, and dalteparin -cimetidine -fentanyl -lithium -NSAIDS, medicines for pain and inflammation, like ibuprofen or naproxen -phentermine -procarbazine -sibutramine -St. John's wort -theophylline -tramadol -tryptophan This list may not describe all possible interactions. Give your health care provider a list of all the medicines, herbs, non-prescription drugs, or dietary supplements you use. Also tell them if you smoke, drink alcohol, or use illegal drugs. Some items may interact with your medicine. What should I watch for while using this medicine? Tell your doctor if your symptoms do not get better or if they get worse. Visit your doctor or health care professional for regular checks on your progress. Because it may take several weeks to see the full effects of this medicine, it is important to continue your treatment as prescribed by your doctor. Patients and their families should watch out for new or worsening thoughts of suicide or depression. Also watch out for sudden  changes in feelings such as feeling anxious, agitated, panicky, irritable, hostile, aggressive, impulsive, severely restless, overly excited and hyperactive, or not being able to sleep. If this happens, especially at the beginning of treatment or after a change in dose, call your health care professional. Bonita Quin may get drowsy or dizzy. Do not drive, use machinery, or do anything that needs mental alertness until you know how this medicine affects you. Do not stand or sit up quickly, especially if you are an older patient. This reduces the risk of dizzy or fainting spells. Alcohol may interfere with the effect of this medicine. Avoid alcoholic drinks. This medicine can cause an increase in blood pressure. Check with your doctor for instructions on monitoring your blood pressure while taking this medicine. Your mouth may get dry. Chewing sugarless gum or sucking hard candy, and drinking plenty of water may help. Contact your doctor if the problem does not go away or is severe. What side effects may I notice from receiving this medicine? Side effects that you should report to your doctor or health care professional as soon as possible: -allergic reactions like skin rash, itching or hives, swelling of the face, lips, or tongue -changes in blood pressure -confusion -dark urine -dizziness -  fast talking and excited feelings or actions that are out of control -fast, irregular heartbeat -fever -general ill feeling or flu-like symptoms -hallucination, loss of contact with reality -light-colored stools -loss of balance or coordination -redness, blistering, peeling or loosening of the skin, including inside the mouth -right upper belly pain -seizures -suicidal thoughts or other mood changes -trouble concentrating -trouble passing urine or change in the amount of urine -unusual bleeding or bruising -unusually weak or tired -yellowing of the eyes or skin Side effects that usually do not require medical  attention (report to your doctor or health care professional if they continue or are bothersome): -blurred vision -change in appetite -change in sex drive or performance -headache -increased sweating -nausea This list may not describe all possible side effects. Call your doctor for medical advice about side effects. You may report side effects to FDA at 1-800-FDA-1088. Where should I keep my medicine? Keep out of the reach of children. Store at room temperature between 15 and 30 degrees C (59 and 86 degrees F). Throw away any unused medicine after the expiration date. NOTE: This sheet is a summary. It may not cover all possible information. If you have questions about this medicine, talk to your doctor, pharmacist, or health care provider.  2013, [employer] Standard. (10/14/2011 9:02:05 PM)  Hydroxyzine capsules or tablets What is this medicine? HYDROXYZINE (hye DROX i zeen) is an antihistamine. This medicine is used to treat allergy symptoms. It is also used to treat anxiety and tension. This medicine can be used with other medicines to induce sleep before surgery. This medicine may be used for other purposes; ask your health care provider or pharmacist if you have questions. What should I tell my health care provider before I take this medicine? They need to know if you have any of these conditions: -any chronic illness -difficulty passing urine -glaucoma -heart disease -kidney disease -liver disease -lung disease -an unusual or allergic reaction to hydroxyzine, cetirizine, other medicines, foods, dyes, or preservatives -pregnant or trying to get pregnant -breast-feeding How should I use this medicine? Take this medicine by mouth with a full glass of water. Follow the directions on the prescription label. You may take this medicine with food or on an empty stomach. Take your medicine at regular intervals. Do not take your medicine more often than directed. Talk to your  pediatrician regarding the use of this medicine in children. Special care may be needed. While this drug may be prescribed for children as young as 41 years of age for selected conditions, precautions do apply. Patients over 4 years old may have a stronger reaction and need a smaller dose. Overdosage: If you think you have taken too much of this medicine contact a poison control center or emergency room at once. NOTE: This medicine is only for you. Do not share this medicine with others. What if I miss a dose? If you miss a dose, take it as soon as you can. If it is almost time for your next dose, take only that dose. Do not take double or extra doses. What may interact with this medicine? -alcohol -barbiturate medicines for sleep or seizures -medicines for colds, allergies -medicines for depression, anxiety, or emotional disturbances -medicines for pain -medicines for sleep -muscle relaxants This list may not describe all possible interactions. Give your health care provider a list of all the medicines, herbs, non-prescription drugs, or dietary supplements you use. Also tell them if you smoke, drink alcohol, or use illegal drugs. Some  items may interact with your medicine. What should I watch for while using this medicine? Tell your doctor or health care professional if your symptoms do not improve. You may get drowsy or dizzy. Do not drive, use machinery, or do anything that needs mental alertness until you know how this medicine affects you. Do not stand or sit up quickly, especially if you are an older patient. This reduces the risk of dizzy or fainting spells. Alcohol may interfere with the effect of this medicine. Avoid alcoholic drinks. Your mouth may get dry. Chewing sugarless gum or sucking hard candy, and drinking plenty of water may help. Contact your doctor if the problem does not go away or is severe. This medicine may cause dry eyes and blurred vision. If you wear contact lenses you  may feel some discomfort. Lubricating drops may help. See your eye doctor if the problem does not go away or is severe. If you are receiving skin tests for allergies, tell your doctor you are using this medicine. What side effects may I notice from receiving this medicine? Side effects that you should report to your doctor or health care professional as soon as possible: -fast or irregular heartbeat -difficulty passing urine -seizures -slurred speech or confusion -tremor Side effects that usually do not require medical attention (report to your doctor or health care professional if they continue or are bothersome): -constipation -drowsiness -fatigue -headache -stomach upset This list may not describe all possible side effects. Call your doctor for medical advice about side effects. You may report side effects to FDA at 1-800-FDA-1088. Where should I keep my medicine? Keep out of the reach of children. Store at room temperature between 15 and 30 degrees C (59 and 86 degrees F). Keep container tightly closed. Throw away any unused medicine after the expiration date. NOTE: This sheet is a summary. It may not cover all possible information. If you have questions about this medicine, talk to your doctor, pharmacist, or health care provider.  2013, [employer]vier/Gold Standard. (10/12/2007 2:50:59 PM)  Depression, Adult Depression refers to feeling sad, low, down in the dumps, blue, gloomy, or empty. In general, there are two kinds of depression: 1. Depression that we all experience from time to time because of upsetting life experiences, including the loss of a job or the ending of a relationship (normal sadness or normal grief). This kind of depression is considered normal, is short lived, and resolves within a few days to 2 weeks. (Depression experienced after the loss of a loved one is called bereavement. Bereavement often lasts longer than 2 weeks but normally gets better with time.) 2. Clinical  depression, which lasts longer than normal sadness or normal grief or interferes with your ability to function at home, at work, and in school. It also interferes with your personal relationships. It affects almost every aspect of your life. Clinical depression is an illness. Symptoms of depression also can be caused by conditions other than normal sadness and grief or clinical depression. Examples of these conditions are listed as follows:  Physical illness Some physical illnesses, including underactive thyroid gland (hypothyroidism), severe anemia, specific types of cancer, diabetes, uncontrolled seizures, heart and lung problems, strokes, and chronic pain are commonly associated with symptoms of depression.  Side effects of some prescription medicine In some people, certain types of prescription medicine can cause symptoms of depression.  Substance abuse Abuse of alcohol and illicit drugs can cause symptoms of depression. SYMPTOMS Symptoms of normal sadness and normal grief include  the following:  Feeling sad or crying for short periods of time.  Not caring about anything (apathy).  Difficulty sleeping or sleeping too much.  No longer able to enjoy the things you used to enjoy.  Desire to be by oneself all the time (social isolation).  Lack of energy or motivation.  Difficulty concentrating or remembering.  Change in appetite or weight.  Restlessness or agitation. Symptoms of clinical depression include the same symptoms of normal sadness or normal grief and also the following symptoms:  Feeling sad or crying all the time.  Feelings of guilt or worthlessness.  Feelings of hopelessness or helplessness.  Thoughts of suicide or the desire to harm yourself (suicidal ideation).  Loss of touch with reality (psychotic symptoms). Seeing or hearing things that are not real (hallucinations) or having false beliefs about your life or the people around you (delusions and  paranoia). DIAGNOSIS  The diagnosis of clinical depression usually is based on the severity and duration of the symptoms. Your caregiver also will ask you questions about your medical history and substance use to find out if physical illness, use of prescription medicine, or substance abuse is causing your depression. Your caregiver also may order blood tests. TREATMENT  Typically, normal sadness and normal grief do not require treatment. However, sometimes antidepressant medicine is prescribed for bereavement to ease the depressive symptoms until they resolve. The treatment for clinical depression depends on the severity of your symptoms but typically includes antidepressant medicine, counseling with a mental health professional, or a combination of both. Your caregiver will help to determine what treatment is best for you. Depression caused by physical illness usually goes away with appropriate medical treatment of the illness. If prescription medicine is causing depression, talk with your caregiver about stopping the medicine, decreasing the dose, or substituting another medicine. Depression caused by abuse of alcohol or illicit drugs abuse goes away with abstinence from these substances. Some adults need professional help in order to stop drinking or using drugs. SEEK IMMEDIATE CARE IF:  You have thoughts about hurting yourself or others.  You lose touch with reality (have psychotic symptoms).  You are taking medicine for depression and have a serious side effect. FOR MORE INFORMATION National Alliance on Mental Illness: www.nami.[employer]: http://www.maynard.net/ Document Released: 05/27/2000 Document Revised: 11/29/2011 Document Reviewed: 08/29/2011 [facility] Patient Information 2014 [place], [place].  Doxycycline delayed-release capsules What is this medicine? DOXYCYCLINE (dox i SYE kleen) is a tetracycline antibiotic. It is used to treat certain kinds of  bacterial infections, Lyme disease, and malaria. It will not work for colds, flu, or other viral infections. This medicine may be used for other purposes; ask your health care provider or pharmacist if you have questions. What should I tell my health care provider before I take this medicine? They need to know if you have any of these conditions: -bowel disease like colitis -liver disease -long exposure to sunlight like working outdoors -an unusual or allergic reaction to doxycycline, tetracycline antibiotics, other medicines, foods, dyes, or preservatives -pregnant or trying to get pregnant -breast-feeding How should I use this medicine? Take this medicine by mouth with a full glass of water. Follow the directions on the prescription label. Do not crush or chew. The capsules may be opened and the pellets sprinkled on applesauce. Swallow the pellets whole without chewing. Follow with an 8 ounce glass of water to help you swallow all the pellets. Do not prepare a dose and store for later use.  The applesauce mixture should be taken immediately after you prepare it. It is best to take this medicine without other food, but if it upsets your stomach take it with food. Take your medicine at regular intervals. Do not take your medicine more often than directed. Take all of your medicine as directed even if you think your are better. Do not skip doses or stop your medicine early. Talk to your pediatrician regarding the use of this medicine in children. Special care may be needed. While this drug may be prescribed for children as young as 29 years old for selected conditions, precautions do apply. Overdosage: If you think you have taken too much of this medicine contact a poison control center or emergency room at once. NOTE: This medicine is only for you. Do not share this medicine with others. What if I miss a dose? If you miss a dose, take it as soon as you can. If it is almost time for your next dose, take  only that dose. Do not take double or extra doses. What may interact with this medicine? -antacids -barbiturates -birth control pills -bismuth subsalicylate -carbamazepine -methoxyflurane -other antibiotics -phenytoin -vitamins that contain iron -warfarin This list may not describe all possible interactions. Give your health care provider a list of all the medicines, herbs, non-prescription drugs, or dietary supplements you use. Also tell them if you smoke, drink alcohol, or use illegal drugs. Some items may interact with your medicine. What should I watch for while using this medicine? Tell your doctor or health care professional if your symptoms do not improve. Do not treat diarrhea with over the counter products. Contact your doctor if you have diarrhea that lasts more than 2 days or if it is severe and watery. Do not take this medicine just before going to bed. It may not dissolve properly when you lay down and can cause pain in your throat. Drink plenty of fluids while taking this medicine to also help reduce irritation in your throat. This medicine can make you more sensitive to the sun. Keep out of the sun. If you cannot avoid being in the sun, wear protective clothing and use sunscreen. Do not use sun lamps or tanning beds/booths. If you are being treated for a sexually transmitted infection, avoid sexual contact until you have finished your treatment. Your sexual partner may also need treatment. Avoid antacids, aluminum, calcium, magnesium, and iron products for 4 hours before and 2 hours after taking a dose of this medicine. Birth control pills may not work properly while you are taking this medicine. Talk to your doctor about using an extra method of birth control. If you are using this medicine to prevent malaria, you should still protect yourself from contact with mosquitos. Stay in screened-in areas, use mosquito nets, keep your body covered, and use an insect repellent. What side  effects may I notice from receiving this medicine? Side effects that you should report to your doctor or health care professional as soon as possible: -allergic reactions like skin rash, itching or hives, swelling of the face, lips, or tongue -difficulty breathing -fever -itching in the rectal or genital area -pain on swallowing -redness, blistering, peeling or loosening of the skin, including inside the mouth -severe stomach pain or cramps -unusual bleeding or bruising -unusually weak or tired -yellowing of the eyes or skin Side effects that usually do not require medical attention (report to your doctor or health care professional if they continue or are bothersome): -diarrhea -loss of  appetite -nausea, vomiting This list may not describe all possible side effects. Call your doctor for medical advice about side effects. You may report side effects to FDA at 1-800-FDA-1088. Where should I keep my medicine? Keep out of the reach of children. Store at room temperature, below 25 degrees C (77 degrees F). Protect from light. Keep container tightly closed. Throw away any unused medicine after the expiration date. Taking this medicine after the expiration date can make you seriously ill. NOTE: This sheet is a summary. It may not cover all possible information. If you have questions about this medicine, talk to your doctor, pharmacist, or health care provider.  2012, Elsevier/Gold Standard. (09/27/2007 2:16:19 PM)  Depression, Adult Depression is feeling sad, low, down in the dumps, blue, gloomy, or empty. In general, there are two kinds of depression:  Normal sadness or grief. This can happen after something upsetting. It often goes away on its own within 2 weeks. After losing a loved one (bereavement), normal sadness and grief may last longer than two weeks. It usually gets better with time.  Clinical depression. This kind lasts longer than normal sadness or grief. It keeps you from doing the  things you normally do in life. It is often hard to function at home, work, or at school. It may affect your relationships with others. Treatment is often needed. GET HELP RIGHT AWAY IF:  You have thoughts about hurting yourself or others.  You lose touch with reality (psychotic symptoms). You may:  See or hear things that are not real.  Have untrue beliefs about your life or people around you.  Your medicine is giving you problems. MAKE SURE YOU:  Understand these instructions.  Will watch your condition.  Will get help right away if you are not doing well or get worse. Document Released: 07/02/2010 Document Revised: 02/22/2012 Document Reviewed: 07/02/2010 [facility] Patient Information 2014 [place], [place].  Insect Bite Mosquitoes, flies, fleas, bedbugs, and many other insects can bite. Insect bites are different from insect stings. A sting is when venom is injected into the skin. Some insect bites can transmit infectious diseases. SYMPTOMS  Insect bites usually turn red, swell, and itch for 2 to 4 days. They often go away on their own. TREATMENT  Your caregiver may prescribe antibiotic medicines if a bacterial infection develops in the bite. HOME CARE INSTRUCTIONS  Do not scratch the bite area.  Keep the bite area clean and dry. Wash the bite area thoroughly with soap and water.  Put ice or cool compresses on the bite area.  Put ice in a plastic bag.  Place a towel between your skin and the bag.  Leave the ice on for 20 minutes, 4 times a day for the first 2 to 3 days, or as directed.  You may apply a baking soda paste, cortisone cream, or calamine lotion to the bite area as directed by your caregiver. This can help reduce itching and swelling.  Only take over-the-counter or prescription medicines as directed by your caregiver.  If you are given antibiotics, take them as directed. Finish them even if you start to feel better. You may need a tetanus shot if:  You  cannot remember when you had your last tetanus shot.  You have never had a tetanus shot.  The injury broke your skin. If you get a tetanus shot, your arm may swell, get red, and feel warm to the touch. This is common and not a problem. If you need a tetanus  shot and you choose not to have one, there is a rare chance of getting tetanus. Sickness from tetanus can be serious. SEEK IMMEDIATE MEDICAL CARE IF:   You have increased pain, redness, or swelling in the bite area.  You see a red line on the skin coming from the bite.  You have a fever.  You have joint pain.  You have a headache or neck pain.  You have unusual weakness.  You have a rash.  You have chest pain or shortness of breath.  You have abdominal pain, nausea, or vomiting.  You feel unusually tired or sleepy. MAKE SURE YOU:   Understand these instructions.  Will watch your condition.  Will get help right away if you are not doing well or get worse. Document Released: 07/07/2004 Document Revised: 08/22/2011 Document Reviewed: 12/29/2010 [facility] Patient Information 2014 [place], [place].

## 2012-12-13 DIAGNOSIS — W57XXXA Bitten or stung by nonvenomous insect and other nonvenomous arthropods, initial encounter: Secondary | ICD-10-CM | POA: Insufficient documentation

## 2012-12-13 DIAGNOSIS — F320 Major depressive disorder, single episode, mild: Secondary | ICD-10-CM | POA: Insufficient documentation

## 2012-12-13 NOTE — Assessment & Plan Note (Signed)
BP Readings from Last 3 Encounters:  12/12/12 112/64  12/09/12 160/83  08/20/12 118/79   Lab Results  Component Value Date   NA 137 08/20/2012   K 4.1 08/20/2012   CREATININE 0.66 08/20/2012    Assessment: Blood pressure control: controlled Progress toward BP goal:  at goal  Plan: Medications:  continue current medications Lisinopril 20mg  and Lopressor 50mg  BID

## 2012-12-13 NOTE — Assessment & Plan Note (Signed)
Lab Results  Component Value Date   HGBA1C 7.4 12/12/2012   HGBA1C 6.7 08/20/2012   HGBA1C 8.3* 05/13/2012    Assessment: Diabetes control: fair control Progress toward A1C goal:  deteriorated Comments: A1C up to 7.4 from 6.7, stopped taking metformin due to decreased po intake from decreased appetite  Plan: Medications:  continue current medications, restart metformin Home glucose monitoring: Frequency: 3 times a day Timing: before meals Instruction/counseling given: reminded to get eye exam, reminded to bring blood glucose meter & log to each visit, reminded to bring medications to each visit, discussed foot care, discussed the need for weight loss and discussed diet

## 2012-12-13 NOTE — Assessment & Plan Note (Addendum)
Multiple "bug bites" b/l lower extremities up to knee with pruritis.  Continues to scratch and now lesions have dried up scabs.  No purulent drainage or tenderness.  Mild surrounding erythema.  Denies tick exposure.  Has 3 dogs at home, possibly secondary to fleas, although says has not seen any on dogs and they are not itching.  Lives with daughter who also has a few bug bites on her chest and abdomen.  Not characteristic of erythema migrans type rash.  No visible track marks that would be suggested of possible scabies.  Started on doxycycline in ED, reports mild relief and resolution of associated nausea.  No relief of pruritis with benadryl.  Itching could also be secondary to anxiety related scratching given depression and insomnia.    -complete course of doxycycline -will try low dose hydroxyzine for itching, with caution for dizziness -supportive therapy for now with topical anti-itch ointments and ice to area as tolerated -avoid itching as much as possible -treat dogs and home for fleas -return to clinic if no improvement

## 2012-12-13 NOTE — Assessment & Plan Note (Addendum)
For at least 1 year since husbands death last year.  Worsening.  Was on cymbalta in the past but stopped last hospitalization and never restarted.  Reported success with cymbalta.  Endorses:    ?Depressed mood most the day, nearly every day ?Loss of interest or pleasure in most or all activities, nearly every day ?Insomnia nearly every day, sleeps on recliner maybe 3-4 hours at night and intermittent sleep ?~25 pound weight loss since last year when moved to [place] from [place], very little appetite.   ?Fatigue or low energy, nearly every day ?Decreased ability to concentrate, think, or make decisions, nearly every day  Denies suicidal or homicidal ideation  -restart cymbalta: start 30mg  x1 week and increase up to 60mg  after that -also recommended and offered counseling and support in community, she says she will think about it and let us know, consider social work assistance in the future

## 2012-12-13 NOTE — Assessment & Plan Note (Signed)
Need to obtain records from [place], [facility].  Claims to have had her immunizations and recent colonoscopy done there.    -mammogram and optho referral done today -a1c done today as well

## 2012-12-17 NOTE — Progress Notes (Signed)
Case discussed with Dr. Qureshi soon after the resident saw the patient.  We reviewed the resident's history and exam and pertinent patient test results.  I agree with the assessment, diagnosis, and plan of care documented in the resident's note. 

## 2012-12-20 ENCOUNTER — Other Ambulatory Visit: Payer: Self-pay

## 2013-01-01 ENCOUNTER — Ambulatory Visit
Admission: RE | Admit: 2013-01-01 | Discharge: 2013-01-01 | Disposition: A | Discharge status: Home / Self Care | Payer: BC Managed Care – PPO | Source: Ambulatory Visit | Attending: Internal Medicine | Admitting: Internal Medicine

## 2013-01-01 DIAGNOSIS — Z1239 Encounter for other screening for malignant neoplasm of breast: Secondary | ICD-10-CM

## 2013-01-04 NOTE — Addendum Note (Signed)
Addended by: Dorie Rank E on: 01/04/2013 06:30 AM   Modules accepted: Orders

## 2013-01-09 ENCOUNTER — Other Ambulatory Visit: Payer: Self-pay | Admitting: Internal Medicine

## 2013-03-09 ENCOUNTER — Emergency Department (HOSPITAL_COMMUNITY): Payer: BC Managed Care – PPO

## 2013-03-09 ENCOUNTER — Encounter (HOSPITAL_COMMUNITY): Payer: Self-pay | Admitting: Radiology

## 2013-03-09 ENCOUNTER — Inpatient Hospital Stay (HOSPITAL_COMMUNITY)
Admission: EM | Admit: 2013-03-09 | Discharge: 2013-03-11 | DRG: 543 | Disposition: A | Discharge status: Home Health | Payer: BC Managed Care – PPO | Attending: Internal Medicine | Admitting: Internal Medicine

## 2013-03-09 DIAGNOSIS — N390 Urinary tract infection, site not specified: Secondary | ICD-10-CM | POA: Diagnosis present

## 2013-03-09 DIAGNOSIS — E1149 Type 2 diabetes mellitus with other diabetic neurological complication: Secondary | ICD-10-CM | POA: Diagnosis present

## 2013-03-09 DIAGNOSIS — F329 Major depressive disorder, single episode, unspecified: Secondary | ICD-10-CM

## 2013-03-09 DIAGNOSIS — Z8673 Personal history of transient ischemic attack (TIA), and cerebral infarction without residual deficits: Secondary | ICD-10-CM | POA: Diagnosis present

## 2013-03-09 DIAGNOSIS — E1140 Type 2 diabetes mellitus with diabetic neuropathy, unspecified: Secondary | ICD-10-CM

## 2013-03-09 DIAGNOSIS — R209 Unspecified disturbances of skin sensation: Secondary | ICD-10-CM | POA: Diagnosis present

## 2013-03-09 DIAGNOSIS — E872 Acidosis, unspecified: Secondary | ICD-10-CM | POA: Diagnosis present

## 2013-03-09 DIAGNOSIS — A419 Sepsis, unspecified organism: Secondary | ICD-10-CM

## 2013-03-09 DIAGNOSIS — R471 Dysarthria and anarthria: Secondary | ICD-10-CM | POA: Diagnosis present

## 2013-03-09 DIAGNOSIS — R531 Weakness: Secondary | ICD-10-CM

## 2013-03-09 DIAGNOSIS — Z79899 Other long term (current) drug therapy: Secondary | ICD-10-CM

## 2013-03-09 DIAGNOSIS — R3915 Urgency of urination: Secondary | ICD-10-CM | POA: Diagnosis present

## 2013-03-09 DIAGNOSIS — J449 Chronic obstructive pulmonary disease, unspecified: Secondary | ICD-10-CM | POA: Diagnosis present

## 2013-03-09 DIAGNOSIS — I10 Essential (primary) hypertension: Secondary | ICD-10-CM | POA: Diagnosis present

## 2013-03-09 DIAGNOSIS — I6789 Other cerebrovascular disease: Secondary | ICD-10-CM

## 2013-03-09 DIAGNOSIS — I639 Cerebral infarction, unspecified: Secondary | ICD-10-CM

## 2013-03-09 DIAGNOSIS — E1142 Type 2 diabetes mellitus with diabetic polyneuropathy: Secondary | ICD-10-CM | POA: Diagnosis present

## 2013-03-09 DIAGNOSIS — R29810 Facial weakness: Secondary | ICD-10-CM | POA: Diagnosis present

## 2013-03-09 DIAGNOSIS — G8190 Hemiplegia, unspecified affecting unspecified side: Secondary | ICD-10-CM | POA: Diagnosis present

## 2013-03-09 DIAGNOSIS — G459 Transient cerebral ischemic attack, unspecified: Secondary | ICD-10-CM | POA: Diagnosis present

## 2013-03-09 DIAGNOSIS — J4489 Other specified chronic obstructive pulmonary disease: Secondary | ICD-10-CM | POA: Diagnosis present

## 2013-03-09 DIAGNOSIS — R4789 Other speech disturbances: Secondary | ICD-10-CM | POA: Diagnosis present

## 2013-03-09 DIAGNOSIS — I959 Hypotension, unspecified: Principal | ICD-10-CM | POA: Diagnosis present

## 2013-03-09 DIAGNOSIS — E781 Pure hyperglyceridemia: Secondary | ICD-10-CM | POA: Diagnosis present

## 2013-03-09 DIAGNOSIS — I5032 Chronic diastolic (congestive) heart failure: Secondary | ICD-10-CM

## 2013-03-09 DIAGNOSIS — F17200 Nicotine dependence, unspecified, uncomplicated: Secondary | ICD-10-CM | POA: Diagnosis present

## 2013-03-09 DIAGNOSIS — G8929 Other chronic pain: Secondary | ICD-10-CM | POA: Diagnosis present

## 2013-03-09 DIAGNOSIS — R5381 Other malaise: Secondary | ICD-10-CM | POA: Diagnosis present

## 2013-03-09 DIAGNOSIS — I509 Heart failure, unspecified: Secondary | ICD-10-CM | POA: Diagnosis present

## 2013-03-09 DIAGNOSIS — R410 Disorientation, unspecified: Secondary | ICD-10-CM

## 2013-03-09 DIAGNOSIS — E118 Type 2 diabetes mellitus with unspecified complications: Secondary | ICD-10-CM | POA: Diagnosis present

## 2013-03-09 DIAGNOSIS — R82998 Other abnormal findings in urine: Secondary | ICD-10-CM | POA: Diagnosis present

## 2013-03-09 HISTORY — DX: Major depressive disorder, single episode, unspecified: F32.9

## 2013-03-09 HISTORY — DX: Depression, unspecified: F32.A

## 2013-03-09 LAB — CBC
HCT: 39.1 % (ref 36.0–46.0)
MCHC: 33.5 g/dL (ref 30.0–36.0)
Platelets: 150 10*3/uL (ref 150–400)
RDW: 13.5 % (ref 11.5–15.5)
WBC: 7.2 10*3/uL (ref 4.0–10.5)

## 2013-03-09 LAB — DIFFERENTIAL
Basophils Absolute: 0 10*3/uL (ref 0.0–0.1)
Basophils Relative: 0 % (ref 0–1)
Lymphocytes Relative: 21 % (ref 12–46)
Monocytes Absolute: 0.5 10*3/uL (ref 0.1–1.0)
Neutro Abs: 5.1 10*3/uL (ref 1.7–7.7)
Neutrophils Relative %: 71 % (ref 43–77)

## 2013-03-09 LAB — PROTIME-INR
INR: 0.92 (ref 0.00–1.49)
Prothrombin Time: 12.2 seconds (ref 11.6–15.2)

## 2013-03-09 LAB — GLUCOSE, CAPILLARY: Glucose-Capillary: 266 mg/dL — ABNORMAL HIGH (ref 70–99)

## 2013-03-09 LAB — POCT I-STAT TROPONIN I: Troponin i, poc: 0.01 ng/mL (ref 0.00–0.08)

## 2013-03-09 LAB — POCT I-STAT, CHEM 8
Chloride: 107 mEq/L (ref 96–112)
HCT: 40 % (ref 36.0–46.0)
Potassium: 3.9 mEq/L (ref 3.5–5.1)
Sodium: 140 mEq/L (ref 135–145)

## 2013-03-09 MED ORDER — ASPIRIN EC 81 MG PO TBEC
81.0000 mg | DELAYED_RELEASE_TABLET | Freq: Every day | ORAL | Status: DC
  Administered 2013-03-10 – 2013-03-11 (×2): 81 mg via ORAL
  Filled 2013-03-09 (×3): qty 1

## 2013-03-09 NOTE — ED Notes (Signed)
Pt to ED via [facility] - code stroke called en route.  Pt has been sleeping throughout the day, woke up at 8pm and noticed left sided weakness and slurred speech per family.  CBG 295, hypotensive en route.

## 2013-03-09 NOTE — Significant Event (Deleted)
Rapid Response Event Note  Overview: Time Called: 2248 Arrival Time: 2258 Event Type: Neurologic  Initial Focused Assessment:   Interventions:   Event Summary: Name of Physician Notified: Dr. Siri Cole at 2248  Name of Consulting Physician Notified: Neuro, Dr. Georga Hacking at 2248     Event End Time: 2333  Mallie Darting

## 2013-03-09 NOTE — Consult Note (Addendum)
Referring Physician: ED    Chief Complaint: CODE STROKE: LEFT HEMIPARESIS, DYSARTHRIA.  HPI:                                                                                                                                         [name] is an 62 y.o. female, left handed, with a past medical history significant for HTN, DM, neuropathy, chronic CHF, stroke without residual deficits several years ago, COPD, brought to [facility] ED by medics as a code stroke due to acute onset left hemiparesis and dysarthria. Never had similar symptoms before. Family said that she was last known well yesterday and even today she was having some troubles walking and has been very sleepy the whole day. Then, around 8:30 pm tonight she developed slurred speech and was not moving the left side and therefore ambulance was called. Very hypotensive on route to the hospital and upon arrival to the ED. She was noted to have significant dysarthria and left sided weakness before going for CT brain but later on had NIHSS 1. Still hypotensive. CT brain showed no acute intracranial abnormality.   At this moment, she denies HA, vertigo, double vision, difficulty swallowing, confusion, or visual disturbances.  Date last known well: 03/08/13 Time last known well: uncertain tPA Given: no , late presentation NIHSS: 1 MRS: 0  Past Medical History  Diagnosis Date  . Hypertension   . Diabetes mellitus without complication     diagnosed around 2010, only ever on metformin  . Chronic pain     neck pain, headache, neuropathy  . Hypertriglyceridemia   . Neuromuscular disorder   . COPD (chronic obstructive pulmonary disease)   . CHF (congestive heart failure)     Past Surgical History  Procedure Laterality Date  . Rotator cuff repair    . Carpal tunnel release    . Abdominal hysterectomy    . Cholecystectomy      No family history on file. Social History:  reports that she has been smoking Cigarettes.  She has a 10.25  pack-year smoking history. She has never used smokeless tobacco. She reports that she does not drink alcohol or use illicit drugs.  Allergies:  Allergies  Allergen Reactions  . Flexeril [Cyclobenzaprine] Other (See Comments)    Prolonged QTc to 571, tachycardia  . Soma [Carisoprodol] Itching and Rash    Medications:  I have reviewed the patient's current medications.  ROS:                                                                                                                                       History obtained from the patient and family.  General ROS: negative for - chills, fever, night sweats, or weight loss Psychological ROS: negative for - behavioral disorder, hallucinations, memory difficulties, mood swings or suicidal ideation Ophthalmic ROS: negative for - blurry vision, double vision, eye pain or loss of vision ENT ROS: negative for - epistaxis, nasal discharge, oral lesions, sore throat, tinnitus or vertigo Allergy and Immunology ROS: negative for - hives or itchy/watery eyes Hematological and Lymphatic ROS: negative for - bleeding problems, bruising or swollen lymph nodes Endocrine ROS: negative for - galactorrhea, hair pattern changes, polydipsia/polyuria or temperature intolerance Respiratory ROS: negative for - cough, hemoptysis, shortness of breath or wheezing Cardiovascular ROS: negative for - chest pain, dyspnea on exertion, edema or irregular heartbeat Gastrointestinal ROS: negative for - abdominal pain, diarrhea, hematemesis, nausea/vomiting or stool incontinence Genito-Urinary ROS: negative for - dysuria, hematuria, incontinence or urinary frequency/urgency Musculoskeletal ROS: negative for - joint swelling Neurological ROS: as noted in HPI Dermatological ROS: negative for rash and skin lesion changes   Physical exam: pleasant female  in no apparent distress. BP 80/50 P 82 R 17 afebrile. Head: normocephalic. Neck: supple, no bruits, no JVD. Cardiac: no murmurs. Lungs: clear. Abdomen: soft, no tender, no mass. Extremities: no edema.   Neurologic Examination:                                                                                                      Mental Status: Alert, awake, oriented, thought content appropriate. Comprehension, naming, and repetition intact. Speech fluent without evidence of aphasia.  Able to follow 3 step commands without difficulty. Cranial Nerves: II: Discs flat bilaterally; Visual fields grossly normal, pupils equal, round, reactive to light and accommodation III,IV, VI: ptosis not present, extra-ocular motions intact bilaterally V,VII: smile symmetric, facial light touch sensation normal bilaterally VIII: hearing normal bilaterally IX,X: gag reflex present XI: bilateral shoulder shrug XII: midline tongue extension Motor: Right : Upper extremity   5/5    Left:     Upper extremity   5/5  Lower extremity   5/5     Lower extremity   5/5 Tone and bulk:normal tone throughout; no atrophy noted Sensory: Pinprick and light touch intact throughout, bilaterally Deep Tendon Reflexes:  1+  all over normal finger-to-nose,  normal heel-to-shin test Gait:  No tested. CV: pulses palpable throughout     Results for orders placed during the hospital encounter of 03/09/13 (from the past 48 hour(s))  PROTIME-INR     Status: None   Collection Time    03/09/13 11:02 PM      Result Value Range   Prothrombin Time 12.2  11.6 - 15.2 seconds   INR 0.92  0.00 - 1.49  CBC     Status: None   Collection Time    03/09/13 11:02 PM      Result Value Range   WBC 7.2  4.0 - 10.5 K/uL   RBC 4.11  3.87 - 5.11 MIL/uL   Hemoglobin 13.1  12.0 - 15.0 g/dL   HCT 16.1  09.6 - 04.5 %   MCV 95.1  78.0 - 100.0 fL   MCH 31.9  26.0 - 34.0 pg   MCHC 33.5  30.0 - 36.0 g/dL   RDW 40.9  81.1 - 91.4 %   Platelets 150   150 - 400 K/uL  DIFFERENTIAL     Status: None   Collection Time    03/09/13 11:02 PM      Result Value Range   Neutrophils Relative % 71  43 - 77 %   Neutro Abs 5.1  1.7 - 7.7 K/uL   Lymphocytes Relative 21  12 - 46 %   Lymphs Abs 1.5  0.7 - 4.0 K/uL   Monocytes Relative 6  3 - 12 %   Monocytes Absolute 0.5  0.1 - 1.0 K/uL   Eosinophils Relative 2  0 - 5 %   Eosinophils Absolute 0.1  0.0 - 0.7 K/uL   Basophils Relative 0  0 - 1 %   Basophils Absolute 0.0  0.0 - 0.1 K/uL  POCT I-STAT TROPONIN I     Status: None   Collection Time    03/09/13 11:14 PM      Result Value Range   Troponin i, poc 0.01  0.00 - 0.08 ng/mL   Comment 3            Comment: Due to the release kinetics of cTnI,     a negative result within the first hours     of the onset of symptoms does not rule out     myocardial infarction with certainty.     If myocardial infarction is still suspected,     repeat the test at appropriate intervals.  POCT I-STAT, CHEM 8     Status: Abnormal   Collection Time    03/09/13 11:16 PM      Result Value Range   Sodium 140  135 - 145 mEq/L   Potassium 3.9  3.5 - 5.1 mEq/L   Chloride 107  96 - 112 mEq/L   BUN 15  6 - 23 mg/dL   Creatinine, Ser 7.82  0.50 - 1.10 mg/dL   Glucose, Bld 956 (*) 70 - 99 mg/dL   Calcium, Ion 2.13 (*) 1.12 - 1.23 mmol/L   TCO2 22  0 - 100 mmol/L   Hemoglobin 13.6  12.0 - 15.0 g/dL   HCT 08.6  57.8 - 46.9 %  GLUCOSE, CAPILLARY     Status: Abnormal   Collection Time    03/09/13 11:33 PM      Result Value Range   Glucose-Capillary 266 (*) 70 - 99 mg/dL   No results found.   [facility] [phone]  03/09/2013, 11:35  PM   Assessment: 62 y.o. female with episode of left hemiparesis and dysarthria, now almost resolved. NIHSS 1. CT brain showed no convincing evidence of acute abnormality. Can not exclude neurological dysfunction in the setting of low BP- cerebral hypoperfusion causing TIA/stroke. Will get MRI-DWI and complete stroke  work up. Start aspirin 81 mg daily.  Stroke Risk Factors - age, HTN, DM, STROKE, CHF.    Wyatt Portela, MD [facility] [phone]  03/09/2013, 11:35 PM

## 2013-03-09 NOTE — Significant Event (Signed)
Rapid Response Event Note  Overview: Time Called: 2248 Arrival Time: 2258 Event Type: Neurologic  Initial Focused Assessment: NIHSS - 1, Hypotensive, CBG 266   Interventions: IVF, CT, labs, CBG   Event Summary: unable to establish LKW, symptoms resolving, Code Stroke cancelled Name of Physician Notified: Dr. Siri Cole at 2248 Neuro, Dr. Georga Hacking   at 2248     Event End Time: 2333  Mallie Darting

## 2013-03-10 ENCOUNTER — Inpatient Hospital Stay (HOSPITAL_COMMUNITY): Payer: BC Managed Care – PPO

## 2013-03-10 ENCOUNTER — Emergency Department (HOSPITAL_COMMUNITY): Payer: BC Managed Care – PPO

## 2013-03-10 ENCOUNTER — Encounter (HOSPITAL_COMMUNITY): Payer: Self-pay | Admitting: Internal Medicine

## 2013-03-10 DIAGNOSIS — I6789 Other cerebrovascular disease: Secondary | ICD-10-CM

## 2013-03-10 DIAGNOSIS — N390 Urinary tract infection, site not specified: Secondary | ICD-10-CM

## 2013-03-10 DIAGNOSIS — I509 Heart failure, unspecified: Secondary | ICD-10-CM

## 2013-03-10 DIAGNOSIS — F329 Major depressive disorder, single episode, unspecified: Secondary | ICD-10-CM

## 2013-03-10 DIAGNOSIS — E1149 Type 2 diabetes mellitus with other diabetic neurological complication: Secondary | ICD-10-CM

## 2013-03-10 DIAGNOSIS — I959 Hypotension, unspecified: Secondary | ICD-10-CM | POA: Diagnosis present

## 2013-03-10 DIAGNOSIS — I5032 Chronic diastolic (congestive) heart failure: Secondary | ICD-10-CM

## 2013-03-10 DIAGNOSIS — Z8673 Personal history of transient ischemic attack (TIA), and cerebral infarction without residual deficits: Secondary | ICD-10-CM | POA: Diagnosis present

## 2013-03-10 DIAGNOSIS — I10 Essential (primary) hypertension: Secondary | ICD-10-CM

## 2013-03-10 DIAGNOSIS — A419 Sepsis, unspecified organism: Secondary | ICD-10-CM

## 2013-03-10 DIAGNOSIS — I6350 Cerebral infarction due to unspecified occlusion or stenosis of unspecified cerebral artery: Secondary | ICD-10-CM

## 2013-03-10 DIAGNOSIS — E1142 Type 2 diabetes mellitus with diabetic polyneuropathy: Secondary | ICD-10-CM

## 2013-03-10 LAB — LIPID PANEL
Total CHOL/HDL Ratio: 6.1 RATIO
Triglycerides: 399 mg/dL — ABNORMAL HIGH (ref <150)

## 2013-03-10 LAB — COMPREHENSIVE METABOLIC PANEL
ALT: 20 U/L (ref 0–35)
AST: 12 U/L (ref 0–37)
Albumin: 3.3 g/dL — ABNORMAL LOW (ref 3.5–5.2)
CO2: 21 mEq/L (ref 19–32)
Chloride: 102 mEq/L (ref 96–112)
Creatinine, Ser: 0.96 mg/dL (ref 0.50–1.10)
GFR calc non Af Amer: 63 mL/min — ABNORMAL LOW (ref >90)
Potassium: 3.8 mEq/L (ref 3.5–5.1)
Sodium: 138 mEq/L (ref 135–145)
Total Bilirubin: 0.3 mg/dL (ref 0.3–1.2)

## 2013-03-10 LAB — RAPID URINE DRUG SCREEN, HOSP PERFORMED
Amphetamines: NOT DETECTED
Barbiturates: NOT DETECTED
Opiates: NOT DETECTED
Tetrahydrocannabinol: NOT DETECTED

## 2013-03-10 LAB — URINE MICROSCOPIC-ADD ON

## 2013-03-10 LAB — URINALYSIS, ROUTINE W REFLEX MICROSCOPIC
Hgb urine dipstick: NEGATIVE
Nitrite: NEGATIVE
Protein, ur: 30 mg/dL — AB
Urobilinogen, UA: 0.2 mg/dL (ref 0.0–1.0)

## 2013-03-10 LAB — GLUCOSE, CAPILLARY: Glucose-Capillary: 215 mg/dL — ABNORMAL HIGH (ref 70–99)

## 2013-03-10 LAB — HEMOGLOBIN A1C
Hgb A1c MFr Bld: 8.1 % — ABNORMAL HIGH (ref <5.7)
Mean Plasma Glucose: 186 mg/dL — ABNORMAL HIGH (ref <117)

## 2013-03-10 LAB — LACTIC ACID, PLASMA: Lactic Acid, Venous: 4.4 mmol/L — ABNORMAL HIGH (ref 0.5–2.2)

## 2013-03-10 MED ORDER — DEXTROSE 5 % IV SOLN
1.0000 g | Freq: Two times a day (BID) | INTRAVENOUS | Status: DC
  Administered 2013-03-11: 1 g via INTRAVENOUS
  Filled 2013-03-10 (×2): qty 10

## 2013-03-10 MED ORDER — SODIUM CHLORIDE 0.9 % IJ SOLN
3.0000 mL | Freq: Two times a day (BID) | INTRAMUSCULAR | Status: DC
  Administered 2013-03-10 (×2): 3 mL via INTRAVENOUS

## 2013-03-10 MED ORDER — INSULIN ASPART 100 UNIT/ML ~~LOC~~ SOLN
0.0000 [IU] | Freq: Three times a day (TID) | SUBCUTANEOUS | Status: DC
  Administered 2013-03-10: 1 [IU] via SUBCUTANEOUS
  Administered 2013-03-10 – 2013-03-11 (×2): 2 [IU] via SUBCUTANEOUS
  Administered 2013-03-11: 1 [IU] via SUBCUTANEOUS

## 2013-03-10 MED ORDER — PREGABALIN 50 MG PO CAPS
50.0000 mg | ORAL_CAPSULE | Freq: Three times a day (TID) | ORAL | Status: DC
  Administered 2013-03-10 – 2013-03-11 (×4): 50 mg via ORAL
  Filled 2013-03-10 (×4): qty 1

## 2013-03-10 MED ORDER — ONDANSETRON HCL 4 MG/2ML IJ SOLN: 4.0000 mg | Freq: Three times a day (TID) | INTRAMUSCULAR | Status: AC | PRN

## 2013-03-10 MED ORDER — SODIUM CHLORIDE 0.9 % IJ SOLN: 3.0000 mL | INTRAMUSCULAR | Status: DC | PRN

## 2013-03-10 MED ORDER — SENNOSIDES-DOCUSATE SODIUM 8.6-50 MG PO TABS
1.0000 | ORAL_TABLET | Freq: Every evening | ORAL | Status: DC | PRN
  Filled 2013-03-10: qty 1

## 2013-03-10 MED ORDER — GABAPENTIN 600 MG PO TABS
600.0000 mg | ORAL_TABLET | Freq: Three times a day (TID) | ORAL | Status: DC
  Administered 2013-03-10 – 2013-03-11 (×4): 600 mg via ORAL
  Filled 2013-03-10 (×6): qty 1

## 2013-03-10 MED ORDER — DEXTROSE 5 % IV SOLN
1.0000 g | Freq: Once | INTRAVENOUS | Status: AC
  Administered 2013-03-10: 1 g via INTRAVENOUS
  Filled 2013-03-10: qty 10

## 2013-03-10 MED ORDER — SODIUM CHLORIDE 0.9 % IV SOLN: 250.0000 mL | INTRAVENOUS | Status: DC | PRN

## 2013-03-10 MED ORDER — ACETAMINOPHEN 325 MG PO TABS
325.0000 mg | ORAL_TABLET | Freq: Four times a day (QID) | ORAL | Status: DC | PRN
  Administered 2013-03-10 – 2013-03-11 (×3): 325 mg via ORAL
  Filled 2013-03-10 (×3): qty 1

## 2013-03-10 MED ORDER — SODIUM CHLORIDE 0.9 % IV SOLN: INTRAVENOUS | Status: AC

## 2013-03-10 MED ORDER — HYDROCODONE-ACETAMINOPHEN 5-325 MG PO TABS
1.0000 | ORAL_TABLET | Freq: Once | ORAL | Status: AC
  Administered 2013-03-10: 1 via ORAL
  Filled 2013-03-10: qty 1

## 2013-03-10 MED ORDER — DULOXETINE HCL 60 MG PO CPEP
60.0000 mg | ORAL_CAPSULE | Freq: Every day | ORAL | Status: DC
  Administered 2013-03-10 – 2013-03-11 (×2): 60 mg via ORAL
  Filled 2013-03-10 (×2): qty 1

## 2013-03-10 MED ORDER — SODIUM CHLORIDE 0.9 % IV BOLUS (SEPSIS)
1000.0000 mL | Freq: Once | INTRAVENOUS | Status: AC
  Administered 2013-03-10: 1000 mL via INTRAVENOUS

## 2013-03-10 MED ORDER — HEPARIN SODIUM (PORCINE) 5000 UNIT/ML IJ SOLN
5000.0000 [IU] | Freq: Three times a day (TID) | INTRAMUSCULAR | Status: DC
  Administered 2013-03-10 – 2013-03-11 (×4): 5000 [IU] via SUBCUTANEOUS
  Filled 2013-03-10 (×7): qty 1

## 2013-03-10 NOTE — ED Notes (Signed)
Admitting at bedside 

## 2013-03-10 NOTE — Progress Notes (Signed)
  Echocardiogram 2D Echocardiogram has been performed.  [name] 03/10/2013, 5:52 PM

## 2013-03-10 NOTE — Progress Notes (Signed)
*  PRELIMINARY RESULTS* Vascular Ultrasound Carotid Duplex (Doppler) has been completed.  Preliminary findings: Bilateral:  1-39% ICA stenosis.  Vertebral artery flow is antegrade.      Farrel Demark, RDMS, RVT  03/10/2013, 12:22 PM

## 2013-03-10 NOTE — Progress Notes (Signed)
PHARMACIST - PHYSICIAN COMMUNICATION  CONCERNING:  Pt takes both gabapentin and pregabalin at home PTA, reordered as inpatient, which is therapeutic duplication.   RECOMMENDATION: Consider maximizing one medication and discontinuing other.   Vernard Gambles, PharmD, BCPS 03/10/2013 6:31 AM

## 2013-03-10 NOTE — H&P (Signed)
  I saw and evaluated the patient.  I personally confirmed the key portions of the history and exam documented by Dr. Yetta Barre and I reviewed pertinent patient test results.  The assessment, diagnosis, and plan were formulated together and I agree with the documentation in the resident's note. Please see my additional comments below.   Ms [name] is a 62 year old lady with history of hypertension, CVA with no residual, DM2 and CHF who was admitted with stroke like symptoms and hypotension. Ct revealed no acute intracranial abnormality per radiologist but neurologist seemed to doubt two new hypodense areas. An MRI brain done subsequently reveals no acute stroke. She also reports urgency symptoms and a UA is positive. I have reviewed all her labs and imaging. No complaints of palpitations, vision changes, chest pain, shortness of breath, cough, fever, chills and rigors, nausea, vomiting, abdominal pain, diarrhea, or constipation.   Exam today:  Filed Vitals:   03/10/13 1200  BP: 169/95  Pulse: 91  Temp: 98.1 F (36.7 C)  Resp: 19   Patient is sleepy because she just received a Vicodin. She says she otherwise feels weak but better than yesterday. She is moving all her extremities and there is no obvious neurologic deficit at this time. Heart and lung exam is unremarkable.    The patient has already been seen by neurology and full stroke work up is being done - most recent MRI although turned negative for stroke. She has negative cardiac work up for any acute coronary syndrome. She has exhibited no arrhythmias on telemetry so far. For UTI she is on ceftriaxone 1 g BID IV right now. In the absence of stroke, her transient hypotension secondary to UTI could likely be the etiology to her symptoms. Her anti-hypertensive medications had been discontinued, however, right now the patient is keeping high blood pressures, and if this continues, we could restart the medications one by one cautiously before discharge  and observe the patient on them. The patient does not meet SIRS criteria.   Rest per resident note.

## 2013-03-10 NOTE — H&P (Signed)
Date: 03/10/2013               Patient Name:  [name] MRN: [id number]  DOB: September 14, 1950 Age / Sex: 62 y.o., female   PCP: Manuela Schwartz, MD         Medical Service: Internal Medicine Teaching Service         Attending Physician: Dr. Blanch Media    First Contact: Dr. Darci Needle Pager: [phone]  Second Contact: Dr. Sherrine Maples Pager: [phone]       After Hours (After 5p/  First Contact Pager: [phone]  weekends / holidays): Second Contact Pager: [phone]   Chief Complaint: L. Hemiparesis, slurred speech  History of Present Illness:   [name] is a 62 y.o. y/o female w/ PMHx of HTN, DM type II (w/ neuropathy), CHF (grade II dCHF), CVA? (several years ago, no residual weakness), and COPD, presents to the ED w/ complaints of left sided weakness and slurred speech for two days.   According to the patient, she was not feeling well yesterday, saying she had a headache and some difficulty walking. Yesterday evening around 8:30 PM, she said she stood up to walk from a seated position and said her left arm and leg were completely weak, inhibiting her ability to stand and walk w/ accompanied slurred speech and facial weakness. The patient also claims she had associated numbness and tingling on the affected extremities as well as lightheadedness on standing for the past day or so. She denies any LOC, history of seizures, change in vision, or difficulty swallowing. She said she does not recall ever having symptoms like this in the past. En route to [facility], the patient was noted to have significant hypotension and on admission she was shown to have BP of 79/55. CT was performed in the ED and read by the radiologist as no acute intracranial abnormality, but with age advanced cerebral atrophy and small vessel ischemic change in the periventricular white matter. Per neurology note, the CT showed 2 hypodense areas in the L. Posterior frontal/periinsular region as well as R.  Parietal/frontal region. On exam in the ED, the patient does not exhibit any remaining weakness, sensory losses, slurred speech or facial droop.   The patient also claims to have some recent urinary urgency. In the ED, a UA was performed, showing glucose of 100, protein of 30, moderate leukocytes, 21-50 WBC's, and many bacteria. She otherwise denies any issues of increased frequency, dysuria, hematuria, or flank pain.   The patient otherwise denies any further issues. No complaints of palpitations, chest pain, SOB, cough, fever, chills, nausea, vomiting, abdominal pain, diarrhea, or constipation.  Meds: Current Facility-Administered Medications  Medication Dose Route Frequency Provider Last Rate Last Dose  . 0.9 %  sodium chloride infusion   Intravenous STAT Enid Skeens, MD      . aspirin EC tablet 81 mg  81 mg Oral Daily Wyatt Portela, MD      . ondansetron [facility]) injection 4 mg  4 mg Intravenous Q8H PRN Enid Skeens, MD       Current Outpatient Prescriptions  Medication Sig Dispense Refill  . DULoxetine (CYMBALTA) 60 MG capsule Take 60 mg by mouth daily.      Marland Kitchen gabapentin (NEURONTIN) 600 MG tablet Take 1 tablet (600 mg total) by mouth 3 (three) times daily.  90 tablet  5  . lisinopril (PRINIVIL,ZESTRIL) 20 MG tablet Take 1 tablet (20 mg total) by mouth daily.  30 tablet  5  .  metFORMIN (GLUCOPHAGE) 500 MG tablet Take 500 mg by mouth 2 (two) times daily with a meal.      . metoprolol (LOPRESSOR) 50 MG tablet Take 1 tablet (50 mg total) by mouth 2 (two) times daily.  60 tablet  11  . pregabalin (LYRICA) 50 MG capsule Take 1 capsule (50 mg total) by mouth 3 (three) times daily.  90 capsule  2    Allergies: Allergies as of 03/09/2013 - Review Complete 03/09/2013  Allergen Reaction Noted  . Flexeril [cyclobenzaprine] Other (See Comments) 08/12/2012  . Soma [carisoprodol] Itching and Rash 05/13/2012   Past Medical History  Diagnosis Date  . Hypertension   . Diabetes mellitus  without complication     diagnosed around 2010, only ever on metformin  . Chronic pain     neck pain, headache, neuropathy  . Hypertriglyceridemia   . Neuromuscular disorder   . COPD (chronic obstructive pulmonary disease)   . CHF (congestive heart failure)    Past Surgical History  Procedure Laterality Date  . Rotator cuff repair    . Carpal tunnel release    . Abdominal hysterectomy    . Cholecystectomy     No family history on file. History   Social History  . Marital Status: Widowed    Spouse Name: N/A    Number of Children: N/A  . Years of Education: N/A   Occupational History  . Not on file.   Social History Main Topics  . Smoking status: Current Every Day Smoker -- 0.25 packs/day for 41 years    Types: Cigarettes  . Smokeless tobacco: Never Used     Comment: Trying to cut back.  . Alcohol Use: No  . Drug Use: No  . Sexual Activity: Not on file     Comment: not asked   Other Topics Concern  . Not on file   Social History Narrative   Moved to [facility] from [place] 2013-11-28shortly after the death of her husband, to live with her daughter.  She notes significant life stress related to her 42 year-old grand-daughter with bipolar disorder.  She has previously worked as a Associate Professor.    Review of Systems: General: Positive for fatigue and poor appetite. Denies fever, chills, diaphoresis.  Respiratory: Denies SOB, DOE, cough, chest tightness, and wheezing.   Cardiovascular: Denies chest pain, palpitations and leg swelling.  Gastrointestinal: Denies nausea, vomiting, abdominal pain, diarrhea, constipation, blood in stool and abdominal distention.  Genitourinary: Positive for urinary urgency. Denies dysuria, frequency, hematuria, flank pain and difficulty urinating.  Endocrine: Denies hot or cold intolerance, sweats, polyuria, polydipsia. Musculoskeletal: Positive for LE neuropathic pain. Recent difficulty w/ gait. Denies myalgias, back pain, joint swelling,  arthralgias.  Skin: Denies pallor, rash and wounds.  Neurological: Positive for L. Sided weakness and numbness, slurred speech, headaches and lightheadedness. Denies dizziness, seizures, syncope.  Psychiatric/Behavioral: Denies mood changes, confusion, nervousness, sleep disturbance and agitation.  Physical Exam: Filed Vitals:   03/10/13 0334 03/10/13 0345 03/10/13 0429 03/10/13 0442  BP:  137/91  158/83  Pulse:  66  69  Temp: 98.3 F (36.8 C)  98.4 F (36.9 C)   TempSrc:   Oral   Resp:  19  20  SpO2:  99%  97%   General: Vital signs reviewed.  Patient is a well-developed and well-nourished, in no acute distress and cooperative with exam. Alert and oriented x3.  Head: Normocephalic and atraumatic. Eyes: PERRL, EOMI, conjunctivae normal, No scleral icterus.  Neck: Supple, trachea  midline, normal ROM, No JVD, masses, thyromegaly, or carotid bruit present.  Cardiovascular: RRR, S1 normal, S2 normal, no murmurs, gallops, or rubs. Pulmonary/Chest: Normal respiratory effort, CTAB, no wheezes, rales, or rhonchi. Abdominal: Soft, non-tender, non-distended, bowel sounds are normal, no masses, organomegaly, or guarding present.  Musculoskeletal: No joint deformities, erythema, or stiffness, ROM full and no nontender. Extremities: No swelling or edema,  pulses symmetric and intact bilaterally. No cyanosis or clubbing. Neurological: A&O x3. Strength is normal and symmetric bilaterally, cranial nerve II-XII are grossly intact, no focal motor deficit, sensory intact to light touch bilaterally.  Skin: Warm, dry and intact. No rashes or erythema. Psychiatric: Normal mood and affect. speech and behavior is normal. Cognition and memory are normal.   Lab results: Basic Metabolic Panel:  Recent Labs  16/10/96 2302 03/09/13 2316  NA 138 140  K 3.8 3.9  CL 102 107  CO2 21  --   GLUCOSE 301* 312*  BUN 16 15  CREATININE 0.96 1.10  CALCIUM 9.0  --    Liver Function Tests:  Recent Labs   03/09/13 2302  AST 12  ALT 20  ALKPHOS 56  BILITOT 0.3  PROT 6.4  ALBUMIN 3.3*   No results found for this basename: LIPASE, AMYLASE,  in the last 72 hours No results found for this basename: AMMONIA,  in the last 72 hours CBC:  Recent Labs  03/09/13 2302 03/09/13 2316  WBC 7.2  --   NEUTROABS 5.1  --   HGB 13.1 13.6  HCT 39.1 40.0  MCV 95.1  --   PLT 150  --    Cardiac Enzymes:  Recent Labs  03/09/13 2302  TROPONINI <0.30   CBG:  Recent Labs  03/09/13 2333  GLUCAP 266*   Fasting Lipid Panel:  Recent Labs  03/10/13 0008  CHOL 209*  HDL 34*  LDLCALC 95  TRIG 045*  CHOLHDL 6.1   Coagulation:  Recent Labs  03/09/13 2302  LABPROT 12.2  INR 0.92   Urine Drug Screen: Drugs of Abuse     Component Value Date/Time   LABOPIA NONE DETECTED 03/09/2013 2346   COCAINSCRNUR NONE DETECTED 03/09/2013 2346   LABBENZ NONE DETECTED 03/09/2013 2346   AMPHETMU NONE DETECTED 03/09/2013 2346   THCU NONE DETECTED 03/09/2013 2346   LABBARB NONE DETECTED 03/09/2013 2346    Alcohol Level:  Recent Labs  03/09/13 2302  ETH <11   Urinalysis:  Recent Labs  03/09/13 2347  COLORURINE AMBER*  LABSPEC 1.022  PHURINE 5.0  GLUCOSEU 100*  HGBUR NEGATIVE  BILIRUBINUR SMALL*  KETONESUR NEGATIVE  PROTEINUR 30*  UROBILINOGEN 0.2  NITRITE NEGATIVE  LEUKOCYTESUR MODERATE*   Imaging results:  Ct Head Wo Contrast  03/09/2013   CLINICAL DATA:  Slurred speech and left-sided weakness. History of stroke.  EXAM: CT HEAD WITHOUT CONTRAST  TECHNIQUE: Contiguous axial images were obtained from the base of the skull through the vertex without intravenous contrast.  COMPARISON:  MRI 08/12/2012. No prior CT.  FINDINGS: Sinuses/Soft tissues: Left maxillary sinus mucous retention cyst or polyp. Clear mastoids  Intracranial: Mildly age advanced cerebral atrophy. Moderate low density in the periventricular white matter likely related to small vessel disease. Remote lacunar infarct in the  left basal ganglia image 12/series 3. Slightly asymmetric right frontal hypoattenuation on image 19/ series 3 is felt to be similar to image 14/ series 7 of the prior MRI.  No hemorrhage, hydrocephalus, intra-axial, or extra-axial fluid collection.  IMPRESSION: 1.  No acute intracranial abnormality.  2. Age advanced cerebral atrophy and small vessel ischemic change in the periventricular white matter. No convincing evidence of acute superimposed infarct. These results were called by telephone at the time of interpretation on 03/09/2013 at 11:23 PM to Dr. Anitra Lauth , who verbally acknowledged these results.   Electronically Signed   By: Jeronimo Greaves   On: 03/09/2013 23:25   Dg Chest Portable 1 View  03/10/2013   CLINICAL DATA:  Hypertension. Diabetes. COPD.  EXAM: PORTABLE CHEST - 1 VIEW  COMPARISON:  08/11/2012  FINDINGS: Patient rotated to the right. Midline trachea. Heart size normal for level of inspiration. No pleural effusion or pneumothorax. Clear lungs.  IMPRESSION: No acute cardiopulmonary disease.   Electronically Signed   By: Jeronimo Greaves   On: 03/10/2013 00:44    Other results: EKG: NSR @ 70 bpm. Diffuse twi's, repolarization abnormality. Unchanged from previous EKG.  Assessment & Plan by Problem: Patient is an 62 y.o. female, left handed, with PMH of HTN, DM (A1c 7.4), peripheral neuropathy, chronic CHF(2-D echo on 08/13/12 EF 55-60% with grade 2 diastolic dysfunction), hx of stroke without residual deficits several years ago, COPD, who present with left hemiparesis and dysarthria, UTI and hypotension. Urinalysis is positive for UTI, CT head showed two well defined hypodense area. LDL 95, A1c 7.5, UDS negative, and POC- troponin negative.   # Ischemic stroke: CT brain showed two well defined hypodense areas at left posterior frontal/periinsular and right parietal-frontal regions, which is likely subacute per neurologist. Patient presents with hypotension possibly d/t to sepsis caused by UTI. It is  likely that her neurologic symptoms are caused by cerebral hypoperfusion. Patient was evaluated by neurologist, Dr. Leroy Kennedy, who recommended stroke work up.  - Will admit to telemetry  - Will get MRI, 2-D echo, carotid Doppler  - NPO until bedside swallowing test is done  - PT/OT consult  - trop X 1 pending - ASA 81 mg daily  - Hold blood pressure medication and allow permissive hypertension in the acute setting of stroke.   # UTI and possible sepsis: Patient presents with a hypotension with a blood pressure 79/55. Patient has a positive urinalysis for UTI and recent symptoms of the urinary urgency. However, the patient has normal WBC's and is afebrile on admission. Patient received 1 dose of Rocephin, 1 g IV in ED and responded to 1 L of normal saline bolus in ED. Her blood pressure increased to 155/88.  -Urine culture pending. -Continue Rocephin IV, 1 g BID.   # Hypertension: Patient presented with hypotension, responsive to IV fluid.  -Hold blood pressure medication in the setting of sepsis and acute stroke.   #: DM2: Hgb A1C on 01/03/13 at 7.4. She is on metformin 1000mg  BID at home.  -Hold metformin while inpatient  -SSI   #: DVT prophylaxis. SQ Heparin   Dispo: Disposition is deferred at this time, awaiting improvement of current medical problems. Anticipated discharge in approximately 2-3 day(s).   The patient does have a current PCP Ula Lingo Montey Hora, MD) and does need an [facility] hospital follow-up appointment after discharge.  The patient does not have transportation limitations that hinder transportation to clinic appointments.  Signed: Courtney Paris, MD 03/10/2013, 4:19 AM  Pager [phone]

## 2013-03-10 NOTE — ED Provider Notes (Signed)
CSN: [id number]     Arrival date & time 03/09/13  2253 History   First MD Initiated Contact with Patient 03/09/13 2305     Chief Complaint  Patient presents with  . Code Stroke   (Consider location/radiation/quality/duration/timing/severity/associated sxs/prior Treatment) HPI Comments: 62 yo female with stroke hx, COPD, lipids, htn presents with code stroke.  Pt woke up at 830 pm and could hardly move left side, slurred speech.  Unsure exact onset. Called in field.  No head injuries.  Mild general HA.  Pt improved on arrival, still L weakness.  Pt tired all day, no appetite.  Decr PO.  No fevers. Nothing improved sxs. Different than previous.  The history is provided by the patient.    Past Medical History  Diagnosis Date  . Hypertension   . Diabetes mellitus without complication     diagnosed around 2010, only ever on metformin  . Chronic pain     neck pain, headache, neuropathy  . Hypertriglyceridemia   . Neuromuscular disorder   . COPD (chronic obstructive pulmonary disease)   . CHF (congestive heart failure)    Past Surgical History  Procedure Laterality Date  . Rotator cuff repair    . Carpal tunnel release    . Abdominal hysterectomy    . Cholecystectomy     No family history on file. History  Substance Use Topics  . Smoking status: Current Every Day Smoker -- 0.25 packs/day for 41 years    Types: Cigarettes  . Smokeless tobacco: Never Used     Comment: Trying to cut back.  . Alcohol Use: No   OB History   Grav Para Term Preterm Abortions TAB SAB Ect Mult Living                 Review of Systems  Constitutional: Positive for appetite change and fatigue. Negative for fever and chills.  HENT: Negative for neck pain and neck stiffness.   Eyes: Negative for visual disturbance.  Respiratory: Negative for shortness of breath.   Cardiovascular: Negative for chest pain.  Gastrointestinal: Negative for vomiting and abdominal pain.  Genitourinary: Negative for dysuria  and flank pain.  Musculoskeletal: Negative for back pain.  Skin: Negative for rash.  Neurological: Positive for weakness and light-headedness. Negative for headaches.    Allergies  Flexeril and Soma  Home Medications   Current Outpatient Rx  Name  Route  Sig  Dispense  Refill  . DULoxetine (CYMBALTA) 60 MG capsule   Oral   Take 60 mg by mouth daily.         Marland Kitchen gabapentin (NEURONTIN) 600 MG tablet   Oral   Take 1 tablet (600 mg total) by mouth 3 (three) times daily.   90 tablet   5   . lisinopril (PRINIVIL,ZESTRIL) 20 MG tablet   Oral   Take 1 tablet (20 mg total) by mouth daily.   30 tablet   5   . metFORMIN (GLUCOPHAGE) 500 MG tablet   Oral   Take 500 mg by mouth 2 (two) times daily with a meal.         . metoprolol (LOPRESSOR) 50 MG tablet   Oral   Take 1 tablet (50 mg total) by mouth 2 (two) times daily.   60 tablet   11   . pregabalin (LYRICA) 50 MG capsule   Oral   Take 1 capsule (50 mg total) by mouth 3 (three) times daily.   90 capsule   2  BP 127/70  Pulse 75  Temp(Src) 98.3 F (36.8 C) (Oral)  Resp 22  SpO2 99% Physical Exam  Nursing note and vitals reviewed. Constitutional: She is oriented to person, place, and time. She appears well-developed and well-nourished.  HENT:  Head: Normocephalic and atraumatic.  Dry mm  Eyes: Conjunctivae are normal. Right eye exhibits no discharge. Left eye exhibits no discharge.  Neck: Normal range of motion. Neck supple. No tracheal deviation present.  Cardiovascular: Normal rate and regular rhythm.   No murmur heard. Pulmonary/Chest: Effort normal and breath sounds normal.  Abdominal: Soft. She exhibits no distension. There is no tenderness. There is no guarding.  Musculoskeletal: She exhibits no edema.  Neurological: She is alert and oriented to person, place, and time. GCS eye subscore is 4. GCS verbal subscore is 5. GCS motor subscore is 6.  Initially 3+ L weakness, improved to 4+ L  weakness. Coordination okay with right side.  Sensation intact bilateral.  Slurred speech resolved.   CNs intact   Skin: Skin is warm. No rash noted.  Psychiatric: She has a normal mood and affect.    ED Course  Procedures (including critical care time) Labs Review Labs Reviewed  COMPREHENSIVE METABOLIC PANEL - Abnormal; Notable for the following:    Glucose, Bld 301 (*)    Albumin 3.3 (*)    GFR calc non Af Amer 63 (*)    GFR calc Af Amer 74 (*)    All other components within normal limits  URINALYSIS, ROUTINE W REFLEX MICROSCOPIC - Abnormal; Notable for the following:    Color, Urine AMBER (*)    APPearance CLOUDY (*)    Glucose, UA 100 (*)    Bilirubin Urine SMALL (*)    Protein, ur 30 (*)    Leukocytes, UA MODERATE (*)    All other components within normal limits  GLUCOSE, CAPILLARY - Abnormal; Notable for the following:    Glucose-Capillary 266 (*)    All other components within normal limits  LACTIC ACID, PLASMA - Abnormal; Notable for the following:    Lactic Acid, Venous 4.4 (*)    All other components within normal limits  LIPID PANEL - Abnormal; Notable for the following:    Cholesterol 209 (*)    Triglycerides 399 (*)    HDL 34 (*)    VLDL 80 (*)    All other components within normal limits  URINE MICROSCOPIC-ADD ON - Abnormal; Notable for the following:    Bacteria, UA MANY (*)    Casts HYALINE CASTS (*)    All other components within normal limits  LACTIC ACID, PLASMA - Abnormal; Notable for the following:    Lactic Acid, Venous 3.5 (*)    All other components within normal limits  POCT I-STAT, CHEM 8 - Abnormal; Notable for the following:    Glucose, Bld 312 (*)    Calcium, Ion 1.24 (*)    All other components within normal limits  URINE CULTURE  CULTURE, BLOOD (ROUTINE X 2)  CULTURE, BLOOD (ROUTINE X 2)  ETHANOL  PROTIME-INR  CBC  DIFFERENTIAL  TROPONIN I  URINE RAPID DRUG SCREEN (HOSP PERFORMED)  HEMOGLOBIN A1C  POCT I-STAT TROPONIN I    Imaging Review Ct Head Wo Contrast  03/09/2013   CLINICAL DATA:  Slurred speech and left-sided weakness. History of stroke.  EXAM: CT HEAD WITHOUT CONTRAST  TECHNIQUE: Contiguous axial images were obtained from the base of the skull through the vertex without intravenous contrast.  COMPARISON:  MRI 08/12/2012.  No prior CT.  FINDINGS: Sinuses/Soft tissues: Left maxillary sinus mucous retention cyst or polyp. Clear mastoids  Intracranial: Mildly age advanced cerebral atrophy. Moderate low density in the periventricular white matter likely related to small vessel disease. Remote lacunar infarct in the left basal ganglia image 12/series 3. Slightly asymmetric right frontal hypoattenuation on image 19/ series 3 is felt to be similar to image 14/ series 7 of the prior MRI.  No hemorrhage, hydrocephalus, intra-axial, or extra-axial fluid collection.  IMPRESSION: 1.  No acute intracranial abnormality. 2. Age advanced cerebral atrophy and small vessel ischemic change in the periventricular white matter. No convincing evidence of acute superimposed infarct. These results were called by telephone at the time of interpretation on 03/09/2013 at 11:23 PM to Dr. Anitra Lauth , who verbally acknowledged these results.   Electronically Signed   By: Jeronimo Greaves   On: 03/09/2013 23:25   Dg Chest Portable 1 View  03/10/2013   CLINICAL DATA:  Hypertension. Diabetes. COPD.  EXAM: PORTABLE CHEST - 1 VIEW  COMPARISON:  08/11/2012  FINDINGS: Patient rotated to the right. Midline trachea. Heart size normal for level of inspiration. No pleural effusion or pneumothorax. Clear lungs.  IMPRESSION: No acute cardiopulmonary disease.   Electronically Signed   By: Jeronimo Greaves   On: 03/10/2013 00:44    MDM   1. Urosepsis   2. Confusion   3. Left-sided weakness   4. Lactic acidosis    CT no acute findings, sxs improving.  Concern for stroke vs decr perfusion from hypotension.  Pt 80 systolic on arrival.   Fluid bolus times 2  . Lactate high, Urosepsis. Neuro on consult.  Multiple rechecks.  Pt improved, sitting up, vitals improved.   Spoke with medicine resident, okay with tele to start, updated pt.   Admitted    Enid Skeens, MD 03/10/13 [phone]

## 2013-03-11 ENCOUNTER — Other Ambulatory Visit: Payer: Self-pay | Admitting: Dietician

## 2013-03-11 ENCOUNTER — Encounter (HOSPITAL_COMMUNITY): Payer: Self-pay | Admitting: General Practice

## 2013-03-11 DIAGNOSIS — IMO0001 Reserved for inherently not codable concepts without codable children: Secondary | ICD-10-CM

## 2013-03-11 DIAGNOSIS — E1140 Type 2 diabetes mellitus with diabetic neuropathy, unspecified: Secondary | ICD-10-CM

## 2013-03-11 DIAGNOSIS — G459 Transient cerebral ischemic attack, unspecified: Secondary | ICD-10-CM

## 2013-03-11 MED ORDER — METFORMIN HCL 500 MG PO TABS
500.0000 mg | ORAL_TABLET | Freq: Two times a day (BID) | ORAL | Status: DC
  Filled 2013-03-11 (×2): qty 1

## 2013-03-11 MED ORDER — GLIPIZIDE ER 2.5 MG PO TB24: 2.5000 mg | ORAL_TABLET | Freq: Every day | ORAL | Status: DC

## 2013-03-11 MED ORDER — METOPROLOL TARTRATE 1 MG/ML IV SOLN
5.0000 mg | Freq: Once | INTRAVENOUS | Status: AC
  Administered 2013-03-11: 5 mg via INTRAVENOUS
  Filled 2013-03-11: qty 5

## 2013-03-11 MED ORDER — METFORMIN HCL 500 MG PO TABS: 500.0000 mg | ORAL_TABLET | Freq: Every day | ORAL | Status: DC

## 2013-03-11 MED ORDER — ATORVASTATIN CALCIUM 40 MG PO TABS: 40.0000 mg | ORAL_TABLET | Freq: Every day | ORAL | Status: DC

## 2013-03-11 MED ORDER — GLIPIZIDE ER 2.5 MG PO TB24
2.5000 mg | ORAL_TABLET | Freq: Every day | ORAL | Status: DC
  Filled 2013-03-11 (×2): qty 1

## 2013-03-11 MED ORDER — ATORVASTATIN CALCIUM 40 MG PO TABS
40.0000 mg | ORAL_TABLET | Freq: Every day | ORAL | Status: DC
  Filled 2013-03-11: qty 1

## 2013-03-11 NOTE — Progress Notes (Addendum)
Subjective: Patient feels better this AM. She has no focal weakness, though does have neck pain and HA that she reports she has at baseline. She had some left leg "aching" yesterday with PT, but otherwise no complaints. She reports having urinary urgency at baseline at home.  Objective: Vital signs in last 24 hours: Filed Vitals:   03/10/13 2000 03/10/13 2351 03/11/13 0509 03/11/13 0830  BP: 177/92 152/70 158/81 180/80  Pulse: 97 80 85 65  Temp: 98.5 F (36.9 C) 98 F (36.7 C) 97.8 F (36.6 C) 98.2 F (36.8 C)  TempSrc: Oral Oral Oral Oral  Resp: 18 18 18 18   Height:      Weight:      SpO2: 94% 96% 96% 97%   Weight change:  No intake or output data in the 24 hours ending 03/11/13 1140  Physical Exam General: alert, cooperative, and in no apparent distress HEENT: pupils equal round and reactive to light, vision grossly intact, oropharynx clear and non-erythematous  Neck: supple Lungs: clear to ascultation bilaterally, normal work of respiration, no wheezes, rales, ronchi Heart: regular rate and rhythm, no murmurs, gallops, or rubs Abdomen: soft, non-tender, non-distended, normal bowel sounds Extremities: warm extremities, no BLE edema Neurologic: alert & oriented X3, cranial nerves II-XII intact, strength 5/5 throughout, sensation intact to light touch, though patient feels L outer upper arm is more tingly than the R upper arm  Lab Results: Basic Metabolic Panel:  Recent Labs Lab 03/09/13 2302 03/09/13 2316  NA 138 140  K 3.8 3.9  CL 102 107  CO2 21  --   GLUCOSE 301* 312*  BUN 16 15  CREATININE 0.96 1.10  CALCIUM 9.0  --    Liver Function Tests:  Recent Labs Lab 03/09/13 2302  AST 12  ALT 20  ALKPHOS 56  BILITOT 0.3  PROT 6.4  ALBUMIN 3.3*   CBC:  Recent Labs Lab 03/09/13 2302 03/09/13 2316  WBC 7.2  --   NEUTROABS 5.1  --   HGB 13.1 13.6  HCT 39.1 40.0  MCV 95.1  --   PLT 150  --    Cardiac Enzymes:  Recent Labs Lab 03/09/13 2302  03/10/13 0702  TROPONINI <0.30 <0.30   CBG:  Recent Labs Lab 03/09/13 2333 03/10/13 0734 03/10/13 1222 03/10/13 1656 03/10/13 2101 03/11/13 0800  GLUCAP 266* 118* 131* 161* 215* 157*   Hemoglobin A1C:  Recent Labs Lab 03/10/13 0007  HGBA1C 8.1*   Fasting Lipid Panel:  Recent Labs Lab 03/10/13 0008 03/10/13 0945  CHOL 209*  --   HDL 34*  --   LDLCALC 95  --   TRIG 399*  --   CHOLHDL 6.1  --   LDLDIRECT  --  117*   Coagulation:  Recent Labs Lab 03/09/13 2302  LABPROT 12.2  INR 0.92   Urine Drug Screen: Drugs of Abuse     Component Value Date/Time   LABOPIA NONE DETECTED 03/09/2013 2346   COCAINSCRNUR NONE DETECTED 03/09/2013 2346   LABBENZ NONE DETECTED 03/09/2013 2346   AMPHETMU NONE DETECTED 03/09/2013 2346   THCU NONE DETECTED 03/09/2013 2346   LABBARB NONE DETECTED 03/09/2013 2346    Alcohol Level:  Recent Labs Lab 03/09/13 2302  ETH <11   Urinalysis:  Recent Labs Lab 03/09/13 2347  COLORURINE AMBER*  LABSPEC 1.022  PHURINE 5.0  GLUCOSEU 100*  HGBUR NEGATIVE  BILIRUBINUR SMALL*  KETONESUR NEGATIVE  PROTEINUR 30*  UROBILINOGEN 0.2  NITRITE NEGATIVE  LEUKOCYTESUR MODERATE*  Micro Results: Recent Results (from the past 240 hour(s))  URINE CULTURE     Status: None   Collection Time    03/09/13 11:47 PM      Result Value Range Status   Specimen Description URINE, CATHETERIZED   Final   Special Requests CX ADDED AT 0006 ON [id number]   Final   Culture  Setup Time     Final   Value: 03/10/2013 00:41     Performed at [facility]   Colony Count     Final   Value: >=100,000 COLONIES/ML     Performed at [facility]   Culture     Final   Value: ESCHERICHIA COLI     Performed at [facility]   Report Status PENDING   Incomplete  CULTURE, BLOOD (ROUTINE X 2)     Status: None   Collection Time    03/10/13 12:28 AM      Result Value Range Status   Specimen Description BLOOD RIGHT ARM   Final   Special Requests  BOTTLES DRAWN AEROBIC ONLY 5CC   Final   Culture  Setup Time     Final   Value: 03/10/2013 17:00     Performed at [facility]   Culture     Final   Value:        BLOOD CULTURE RECEIVED NO GROWTH TO DATE CULTURE WILL BE HELD FOR 5 DAYS BEFORE ISSUING A FINAL NEGATIVE REPORT     Performed at [facility]   Report Status PENDING   Incomplete  CULTURE, BLOOD (ROUTINE X 2)     Status: None   Collection Time    03/10/13  1:34 AM      Result Value Range Status   Specimen Description BLOOD RIGHT HAND   Final   Special Requests BOTTLES DRAWN AEROBIC ONLY 4CC   Final   Culture  Setup Time     Final   Value: 03/10/2013 16:59     Performed at [facility]   Culture     Final   Value:        BLOOD CULTURE RECEIVED NO GROWTH TO DATE CULTURE WILL BE HELD FOR 5 DAYS BEFORE ISSUING A FINAL NEGATIVE REPORT     Performed at [facility]   Report Status PENDING   Incomplete   Studies/Results: Dg Chest 2 View  03/10/2013   *RADIOLOGY REPORT*  Clinical Data: Stroke, weakness  CHEST - 2 VIEW  Comparison: Prior chest x-ray 03/10/2013 at 0009 hours  Findings: Mild chronic central bronchitic changes.  Cardiac and mediastinal contours within normal limits.  Negative for edema, focal airspace consolidation, effusion or pneumothorax.  No acute osseous abnormality.  Surgical clips in the right upper quadrant suggest prior cholecystectomy.  Soft tissue anchor in the left humeral head from prior rotator cuff repair.  IMPRESSION: No acute cardiopulmonary disease.   Original Report Authenticated By: Malachy Moan, M.D.   Ct Head Wo Contrast  03/09/2013   CLINICAL DATA:  Slurred speech and left-sided weakness. History of stroke.  EXAM: CT HEAD WITHOUT CONTRAST  TECHNIQUE: Contiguous axial images were obtained from the base of the skull through the vertex without intravenous contrast.  COMPARISON:  MRI 08/12/2012. No prior CT.  FINDINGS: Sinuses/Soft tissues: Left maxillary sinus mucous  retention cyst or polyp. Clear mastoids  Intracranial: Mildly age advanced cerebral atrophy. Moderate low density in the periventricular white matter likely related to small vessel disease. Remote lacunar infarct  in the left basal ganglia image 12/series 3. Slightly asymmetric right frontal hypoattenuation on image 19/ series 3 is felt to be similar to image 14/ series 7 of the prior MRI.  No hemorrhage, hydrocephalus, intra-axial, or extra-axial fluid collection.  IMPRESSION: 1.  No acute intracranial abnormality. 2. Age advanced cerebral atrophy and small vessel ischemic change in the periventricular white matter. No convincing evidence of acute superimposed infarct. These results were called by telephone at the time of interpretation on 03/09/2013 at 11:23 PM to Dr. Anitra Lauth , who verbally acknowledged these results.   Electronically Signed   By: Jeronimo Greaves   On: 03/09/2013 23:25   Mr Maxine Glenn Head Wo Contrast  03/10/2013   CLINICAL DATA:  62 year old female with history of diabetes and CHF. Left side weakness and slurred speech for 2 days. Initial encounter.  EXAM: MRI HEAD WITHOUT CONTRAST  MRA HEAD WITHOUT CONTRAST  TECHNIQUE: Multiplanar, multiecho pulse sequences of the brain and surrounding structures were obtained without intravenous contrast. Angiographic images of the head were obtained using MRA technique without contrast.  COMPARISON:  Head CT without contrast 03/09/2013. Brain MRI 08/12/2012.  FINDINGS: MRI HEAD FINDINGS  Stable cerebral volume. Small focus of increased trace diffusion in the right parietal lobe on series 3, image 24 not correlated on ADC map. Favor image noise. No restricted diffusion to suggest acute infarction. No midline shift, mass effect, evidence of mass lesion, ventriculomegaly, extra-axial collection or acute intracranial hemorrhage. Cervicomedullary junction and pituitary are within normal limits. Negative visualized cervical spine. Major intracranial vascular flow voids are  stable.  Patchy in confluent bilateral cerebral white matter T2 and FLAIR hyperintensity in a nonspecific pattern. A chronic lacunar infarct in the left corona radiata bordering on the external capsule and lentiform nuclei (series 6, image 14) has developed since the prior study. Confluent T2 and FLAIR hyperintensity in the pons is stable. Cerebellum remains within normal limits. Negative visualized internal auditory structures.  Normal bone marrow signal. Visualized orbit soft tissues are within normal limits. Maxillary sinus mucous retention cysts are stable. Negative scalp soft tissues.  MRA HEAD FINDINGS  Study is mildly to moderately degraded by motion artifact despite repeated imaging attempts.  Antegrade flow in the posterior circulation. Mildly dominant distal right vertebral artery. Normal right PICA origin. Irregular distal left vertebral artery but no definite hemodynamically significant stenosis. Patent vertebrobasilar junction. No basilar stenosis. SCA and PCA origins are within normal limits. Posterior communicating arteries are diminutive or absent. Bilateral PCA branches are within normal limits.  Antegrade flow in both ICA siphons. Tortuous distal cervical right ICA. ICA irregularity without definite stenosis. Carotid termini within normal limits. MCA and ACA origins within normal limits. Dominant right ACA A1 segment. Anterior communicating artery and visualized ACA branches within normal limits. Visualized bilateral MCA branches within normal limits allowing for motion artifact. Left M1 segment superior branch infundibulum.  IMPRESSION: MRI HEAD IMPRESSION  1. No acute intracranial abnormality.  2. Age advanced chronic small vessel disease, progressed since March 2014.  MRA HEAD IMPRESSION  Motion degraded. Intracranial atherosclerosis suspected, but no significant intracranial stenosis or major circle of Willis branch occlusion identified.   Electronically Signed   By: Augusto Gamble M.D.   On:  03/10/2013 10:18   Mr Brain Wo Contrast  03/10/2013   CLINICAL DATA:  62 year old female with history of diabetes and CHF. Left side weakness and slurred speech for 2 days. Initial encounter.  EXAM: MRI HEAD WITHOUT CONTRAST  MRA HEAD WITHOUT CONTRAST  TECHNIQUE: Multiplanar, multiecho pulse sequences of the brain and surrounding structures were obtained without intravenous contrast. Angiographic images of the head were obtained using MRA technique without contrast.  COMPARISON:  Head CT without contrast 03/09/2013. Brain MRI 08/12/2012.  FINDINGS: MRI HEAD FINDINGS  Stable cerebral volume. Small focus of increased trace diffusion in the right parietal lobe on series 3, image 24 not correlated on ADC map. Favor image noise. No restricted diffusion to suggest acute infarction. No midline shift, mass effect, evidence of mass lesion, ventriculomegaly, extra-axial collection or acute intracranial hemorrhage. Cervicomedullary junction and pituitary are within normal limits. Negative visualized cervical spine. Major intracranial vascular flow voids are stable.  Patchy in confluent bilateral cerebral white matter T2 and FLAIR hyperintensity in a nonspecific pattern. A chronic lacunar infarct in the left corona radiata bordering on the external capsule and lentiform nuclei (series 6, image 14) has developed since the prior study. Confluent T2 and FLAIR hyperintensity in the pons is stable. Cerebellum remains within normal limits. Negative visualized internal auditory structures.  Normal bone marrow signal. Visualized orbit soft tissues are within normal limits. Maxillary sinus mucous retention cysts are stable. Negative scalp soft tissues.  MRA HEAD FINDINGS  Study is mildly to moderately degraded by motion artifact despite repeated imaging attempts.  Antegrade flow in the posterior circulation. Mildly dominant distal right vertebral artery. Normal right PICA origin. Irregular distal left vertebral artery but no definite  hemodynamically significant stenosis. Patent vertebrobasilar junction. No basilar stenosis. SCA and PCA origins are within normal limits. Posterior communicating arteries are diminutive or absent. Bilateral PCA branches are within normal limits.  Antegrade flow in both ICA siphons. Tortuous distal cervical right ICA. ICA irregularity without definite stenosis. Carotid termini within normal limits. MCA and ACA origins within normal limits. Dominant right ACA A1 segment. Anterior communicating artery and visualized ACA branches within normal limits. Visualized bilateral MCA branches within normal limits allowing for motion artifact. Left M1 segment superior branch infundibulum.  IMPRESSION: MRI HEAD IMPRESSION  1. No acute intracranial abnormality.  2. Age advanced chronic small vessel disease, progressed since March 2014.  MRA HEAD IMPRESSION  Motion degraded. Intracranial atherosclerosis suspected, but no significant intracranial stenosis or major circle of Willis branch occlusion identified.   Electronically Signed   By: Augusto Gamble M.D.   On: 03/10/2013 10:18   Dg Chest Portable 1 View  03/10/2013   CLINICAL DATA:  Hypertension. Diabetes. COPD.  EXAM: PORTABLE CHEST - 1 VIEW  COMPARISON:  08/11/2012  FINDINGS: Patient rotated to the right. Midline trachea. Heart size normal for level of inspiration. No pleural effusion or pneumothorax. Clear lungs.  IMPRESSION: No acute cardiopulmonary disease.   Electronically Signed   By: Jeronimo Greaves   On: 03/10/2013 00:44   Medications: I have reviewed the patient's current medications. Scheduled Meds: . aspirin EC  81 mg Oral Daily  . atorvastatin  40 mg Oral q1800  . cefTRIAXone (ROCEPHIN) IVPB 1 gram/50 mL D5W  1 g Intravenous Q12H  . DULoxetine  60 mg Oral Daily  . gabapentin  600 mg Oral TID  . glipiZIDE  2.5 mg Oral Q breakfast  . heparin  5,000 Units Subcutaneous Q8H  . insulin aspart  0-9 Units Subcutaneous TID WC  . metFORMIN  500 mg Oral BID WC  .  pregabalin  50 mg Oral TID  . sodium chloride  3 mL Intravenous Q12H   Continuous Infusions:  PRN Meds:.sodium chloride, acetaminophen, senna-docusate, sodium chloride  Assessment/Plan:  Patient is  an 62 y.o. female, left handed, with PMH of HTN, DM (A1c 8.1), peripheral neuropathy, chronic CHF(2-D echo on 08/13/12 EF 55-60% with grade 2 diastolic dysfunction), hx of stroke without residual deficits several years ago, COPD, who present with left hemiparesis and dysarthria, UTI and hypotension. Urinalysis is positive for UTI, CT head showed two well defined hypodense area. LDL 95, A1c 8.1, UDS negative, and POC- troponin negative.   # TIA: CT brain showed two well defined hypodense areas at left posterior frontal/periinsular and right parietal-frontal regions, which is likely subacute per neurologist. Patient presents with hypotension possibly d/t to sepsis caused by UTI. It is likely that her neurologic symptoms are caused by cerebral hypoperfusion. Patient was evaluated by neurologist, Dr. Leroy Kennedy, who recommended stroke work up.  -tele- no events -MRI- negative for acute stroke -carotid doppler 1-39% stenosis bilaterally with normal vertebral artery flow -Echo- no embolic source found - PT- outpatient pt recommended - trop X 2 negative - ASA 81 mg daily (pt does take this at home) - restart antihypertensives upon discharge -will start statin upon discharge  # UTI and possible sepsis: Patient presented with a hypotension with a blood pressure 79/55. Patient received 1 dose of Rocephin, 1 g IV in ED and responded to 1 L of normal saline bolus in ED. BPs are now elevated (152-177/70-92). Patient had a positive urinalysis for UTI and recent symptoms of the urinary urgency, though this appears to be patient's baseline. The patient has had normal WBC count and has been afebrile. She was treated with a 2 day course of rocephin, no abx on discharge given pt's urinary urgency is her baseline, so thought  to be asymptomatic bacteriuria.  -Urine culture pending.  -Continue Rocephin IV, 1 g BID until discharge  # Hypertension: Patient presented with hypotension, responsive to IV fluid.  -Hold blood pressure medication in the setting of sepsis and TIA  #: DM2: Hgb A1C 8.1% on this admission. She is on metformin 500mg  BID at home.  -Hold metformin while inpatient  -SSI  - will start glipizide 2.5mg  daily (long acting formula) today as her diabetes is not controlled per A1c  #: DVT prophylaxis. SQ Heparin   Dispo: Discharge today.  The patient does not have a current PCP (No primary provider on file.) and does need an [facility] hospital follow-up appointment after discharge.  The patient does not have transportation limitations that hinder transportation to clinic appointments.  .Services Needed at time of discharge: Y = Yes, Blank = No PT:   OT:   RN:   Equipment:   Other:     LOS: 2 days   Windell Hummingbird, MD 03/11/2013, 11:40 AM

## 2013-03-11 NOTE — Progress Notes (Signed)
Pt provided with dc instructions and educaiton. BP elevated. MD aware and states "pt will take blood pressure medicaitons when she gets home". No complaints at this time. IV removed with tip intact. Heart monitor cleaned and returned to front. Pt awaiting daughter to pick her up. Levonne Spiller, Rn

## 2013-03-11 NOTE — Evaluation (Signed)
Physical Therapy Evaluation Patient Details Name: [name] MRN: [id number] DOB: 1950-11-30 Today's Date: 03/11/2013 Time: 2956-2130 PT Time Calculation (min): 12 min  PT Assessment / Plan / Recommendation History of Present Illness  pt presents with 2 day hx of L sided weakness and slurred speech.  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    Clinical Impression  Pt movign well, though L side fatigues quicker than R.  Pt would benefit from OPPT for high level balance and safety.  Pt telling this PT about having memory difficulty from last night.  She states she remembers getting her dinner and then nothing after that until this morning.  RN made aware.  Gave pt LE coordination and strengthening there ex to perform independently.      PT Assessment  Patient needs continued PT services    Follow Up Recommendations  Outpatient PT;Supervision - Intermittent    Does the patient have the potential to tolerate intense rehabilitation      Barriers to Discharge        Equipment Recommendations  None recommended by PT    Recommendations for Other Services     Frequency Min 4X/week    Precautions / Restrictions Precautions Precautions: None Restrictions Weight Bearing Restrictions: No   Pertinent Vitals/Pain Denies pain.        Mobility  Bed Mobility Bed Mobility: Supine to Sit;Sitting - Scoot to Edge of Bed;Sit to Supine Supine to Sit: 6: Modified independent (Device/Increase time) Sitting - Scoot to Edge of Bed: 7: Independent Sit to Supine: 6: Modified independent (Device/Increase time) Details for Bed Mobility Assistance: moves slower, but no A needed.   Transfers Transfers: Sit to Stand;Stand to Sit Sit to Stand: 6: Modified independent (Device/Increase time);With upper extremity assist;From bed Stand to Sit: 6: Modified independent (Device/Increase time);With upper extremity assist;To bed Details for Transfer Assistance: Demos good safety.    Ambulation/Gait Ambulation/Gait Assistance: 5: Supervision Ambulation Distance (Feet): 200 Feet Assistive device: None Ambulation/Gait Assistance Details: Moves well, but indicates L LE fatiguing quicker than R LE and beginning to feel heavy towards end of amb.   Gait Pattern: Step-through pattern;Decreased stride length Stairs: Yes Stairs Assistance: 4: Min guard Stair Management Technique: One rail Right;Forwards Number of Stairs: 11 Wheelchair Mobility Wheelchair Mobility: No Modified Rankin (Stroke Patients Only) Pre-Morbid Rankin Score: No symptoms Modified Rankin: Moderate disability    Exercises     PT Diagnosis: Difficulty walking  PT Problem List: Decreased strength;Decreased activity tolerance;Decreased balance;Decreased mobility;Decreased coordination PT Treatment Interventions: DME instruction;Gait training;Stair training;Functional mobility training;Therapeutic activities;Therapeutic exercise;Balance training;Neuromuscular re-education;Patient/family education     PT Goals(Current goals can be found in the care plan section) Acute Rehab PT Goals Patient Stated Goal: Home with family PT Goal Formulation: With patient Time For Goal Achievement: 03/25/13 Potential to Achieve Goals: Good  Visit Information  Last PT Received On: 03/11/13 Assistance Needed: +1 History of Present Illness: pt presents with 2 day hx of L sided weakness and slurred speech.  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         Prior Functioning  Home Living Family/patient expects to be discharged to:: Private residence Living Arrangements: Children Available Help at Discharge: Family;Available PRN/intermittently Type of Home: House Home Access: Stairs to enter [employer] of Steps: 2 Entrance Stairs-Rails: None Home Layout: One level Home Equipment: None Additional Comments: pt plans to move to a bigger home with her daughter's family soon.   Prior  Function  Level of Independence: Independent Communication Communication: No difficulties Dominant Hand: Left    Cognition  Cognition Arousal/Alertness: Awake/alert Behavior During Therapy: WFL for tasks assessed/performed Overall Cognitive Status: Within Functional Limits for tasks assessed Memory:  (pt indicates one instance of loss of memory)    Extremity/Trunk Assessment Upper Extremity Assessment Upper Extremity Assessment: Defer to OT evaluation Lower Extremity Assessment Lower Extremity Assessment: LLE deficits/detail LLE Deficits / Details: Generally functional, 4/5 LLE Sensation: history of peripheral neuropathy Cervical / Trunk Assessment Cervical / Trunk Assessment: Normal   Balance Balance Balance Assessed: Yes High Level Balance High Level Balance Activites: Head turns;Turns;Direction changes  End of Session PT - End of Session Equipment Utilized During Treatment: Gait belt Activity Tolerance: Patient tolerated treatment well Patient left: in bed;with call bell/phone within reach Nurse Communication: Mobility status (Memory loss from last night)  GP     Raza Bayless, Alison Murray, [place] [phone] 03/11/2013, 8:08 AM

## 2013-03-11 NOTE — Progress Notes (Signed)
Inpatient Diabetes Program Recommendations  AACE/ADA: New Consensus Statement on Inpatient Glycemic Control (2013)  Target Ranges:  Prepandial:   less than 140 mg/dL      Peak postprandial:   less than 180 mg/dL (1-2 hours)      Critically ill patients:  140 - 180 mg/dL   Reason for Visit: Results for [name], [name] (MRN [id number]) as of 03/11/2013 10:31  Ref. Range 03/10/2013 07:34 03/10/2013 12:22 03/10/2013 16:56 03/10/2013 21:01 03/11/2013 08:00  Glucose-Capillary Latest Range: 70-99 mg/dL 409 (H) 811 (H) 914 (H) 215 (H) 157 (H)   Added CHO modified to Heart healthy diet.  If fasting CBG's continue greater than 140 mg/dL, consider adding Lantus 10 units daily.  Note that A1C=8.1% indicating sub-optimal control of CBG's prior to admit.  Will need close follow-up with PCP.      Beryl Meager, RN, BC-ADM Inpatient Diabetes Coordinator Pager [phone]

## 2013-03-11 NOTE — Care Management Note (Signed)
    Page 1 of 1   03/11/2013     2:22:07 PM   CARE MANAGEMENT NOTE 03/11/2013  Patient:  [name], [name]   Account Number:  [id number]  Date Initiated:  03/11/2013  Documentation initiated by:  GRAVES-BIGELOW,Anael Rosch  Subjective/Objective Assessment:   Pt admitted for L. Hemiparesis, slurred speech. Plan for home with [facility] PT services today. Pt c/o headache and ewlevated bp. RN did make MD aware and medicine received.     Action/Plan:   Pt is agreeable to [facility] PT services with [facility]. CM did make referral for services and SOC to begin within 24- 48 hours post d/c.   Anticipated DC Date:  03/11/2013   Anticipated DC Plan:  HOME W HOME HEALTH SERVICES      DC Planning Services  CM consult      [facility] Choice  HOME HEALTH   Choice offered to / List presented to:  C-1 Patient        HH arranged  HH-2 PT      [facility] agency  [facility]   Status of service:  Completed, signed off Medicare Important Message given?   (If response is "NO", the following Medicare IM given date fields will be blank) Date Medicare IM given:   Date Additional Medicare IM given:    Discharge Disposition:  HOME W HOME HEALTH SERVICES  Per UR Regulation:  Reviewed for med. necessity/level of care/duration of stay  If discussed at Long Length of Stay Meetings, dates discussed:    Comments:

## 2013-03-11 NOTE — Evaluation (Signed)
Occupational Therapy Evaluation and Discharge Summary Patient Details Name: [name] MRN: [id number] DOB: December 10, 1950 Today's Date: 03/11/2013 Time: 4782-9562 OT Time Calculation (min): 15 min  OT Assessment / Plan / Recommendation History of present illness pt presents with 2 day hx of L sided weakness and slurred speech.  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     Clinical Impression   Pt admitted with above diagnosis and does not present with any major new deficits. Pt states she always feels a little "weak" in her arms.  Arms are 4+/5 for strength. Pt with no coordination deficits.  Pt to cont with OPPT for mild L leg weakness.    OT Assessment  Patient does not need any further OT services    Follow Up Recommendations  No OT follow up    Barriers to Discharge      Equipment Recommendations  None recommended by OT    Recommendations for Other Services    Frequency       Precautions / Restrictions Precautions Precautions: None Restrictions Weight Bearing Restrictions: No   Pertinent Vitals/Pain Pt's BP 189/127 on arrival and 209/98 10 min later sitting on EOB.  Limited pts activity due to recent CVA and nursing notified.    ADL  Eating/Feeding: Simulated;Independent Where Assessed - Eating/Feeding: Edge of bed Grooming: Performed;Teeth care;Brushing hair;Set up Where Assessed - Grooming: Unsupported sitting Upper Body Bathing: Simulated;Set up Where Assessed - Upper Body Bathing: Unsupported sitting Lower Body Bathing: Simulated;Set up Where Assessed - Lower Body Bathing: Unsupported sit to stand Upper Body Dressing: Performed;Set up Where Assessed - Upper Body Dressing: Unsupported sitting Lower Body Dressing: Performed;Set up Where Assessed - Lower Body Dressing: Unsupported sit to stand Toilet Transfer: Performed;Independent Toilet Transfer Method:  (walked to br) Acupuncturist: Comfort height toilet;Grab  bars Toileting - Clothing Manipulation and Hygiene: Performed;Independent Where Assessed - Toileting Clothing Manipulation and Hygiene: Standing Transfers/Ambulation Related to ADLs: Pt walked in room [place] for all adls.  Pt says L side fatigues quickly. ADL Comments: Pt I with all adls despite stating that L arm "always" feels weaker than the right.  Pt has 24/7 S and  has no dificits with coordination or adls.    OT Diagnosis:    OT Problem List:   OT Treatment Interventions:     OT Goals(Current goals can be found in the care plan section) Acute Rehab OT Goals Patient Stated Goal: Home with family  Visit Information  Last OT Received On: 03/11/13 Assistance Needed: +1 History of Present Illness: pt presents with 2 day hx of L sided weakness and slurred speech.  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         Prior Functioning     Home Living Family/patient expects to be discharged to:: Private residence Living Arrangements: Children Available Help at Discharge: Family;Available PRN/intermittently Type of Home: House Home Access: Stairs to enter [employer] of Steps: 2 Entrance Stairs-Rails: None Home Layout: One level Home Equipment: None Additional Comments: pt plans to move to a bigger home with her daughter's family soon.   Prior Function Level of Independence: Independent Communication Communication: No difficulties Dominant Hand: Left         Vision/Perception Vision - History Baseline Vision: No visual deficits Patient Visual Report: No change from baseline Vision - Assessment Vision Assessment: Vision not tested   Cognition  Cognition Arousal/Alertness: Awake/alert Behavior During Therapy: WFL for tasks assessed/performed Overall Cognitive Status:  Within Functional Limits for tasks assessed Memory:  (reports single loss of memory from last night to this am)    Extremity/Trunk Assessment Upper Extremity  Assessment Upper Extremity Assessment: Overall WFL for tasks assessed Lower Extremity Assessment Lower Extremity Assessment: Defer to PT evaluation Cervical / Trunk Assessment Cervical / Trunk Assessment: Normal     Mobility Bed Mobility Bed Mobility: Supine to Sit;Sitting - Scoot to Edge of Bed;Sit to Supine Supine to Sit: 6: Modified independent (Device/Increase time) Sitting - Scoot to Edge of Bed: 7: Independent Sit to Supine: 6: Modified independent (Device/Increase time) Details for Bed Mobility Assistance: moves slower, but no A needed.   Transfers Transfers: Sit to Stand;Stand to Sit Sit to Stand: 6: Modified independent (Device/Increase time);With upper extremity assist;From bed Stand to Sit: 6: Modified independent (Device/Increase time);With upper extremity assist;To bed Details for Transfer Assistance: [employer] good safety.       Exercise     Balance Balance Balance Assessed: No   End of Session OT - End of Session Activity Tolerance: Patient tolerated treatment well;Other (comment) (limited by high BP) Patient left: in bed;with call bell/phone within reach Nurse Communication: Mobility status;Other (comment) (high BP)  GO     Hope Budds 03/11/2013, 12:22 PM [phone]

## 2013-03-11 NOTE — Discharge Summary (Signed)
Name: [name] MRN: [id number] DOB: Nov 08, 1950 62 y.o. PCP: No primary provider on file.  Date of Admission: 03/09/2013 10:57 PM Date of Discharge: 03/11/2013 Attending Physician: Dr. Rogelia Boga  Discharge Diagnosis: Principal Problem:   TIA (transient ischemic attack) Active Problems:   Type 2 diabetes, uncontrolled, with neuropathy   Hypertension   Diastolic CHF, chronic grade 2   UTI (lower urinary tract infection)   Sepsis  Discharge Medications:   Medication List         atorvastatin 40 MG tablet  Commonly known as:  LIPITOR  Take 1 tablet (40 mg total) by mouth daily at 6 PM.     DULoxetine 60 MG capsule  Commonly known as:  CYMBALTA  Take 60 mg by mouth daily.     gabapentin 600 MG tablet  Commonly known as:  NEURONTIN  Take 1 tablet (600 mg total) by mouth 3 (three) times daily.     glipiZIDE 2.5 MG 24 hr tablet  Commonly known as:  GLUCOTROL XL  Take 1 tablet (2.5 mg total) by mouth daily with breakfast.     lisinopril 20 MG tablet  Commonly known as:  PRINIVIL,ZESTRIL  Take 1 tablet (20 mg total) by mouth daily.     metFORMIN 500 MG tablet  Commonly known as:  GLUCOPHAGE  Take 500 mg by mouth 2 (two) times daily with a meal.     metoprolol 50 MG tablet  Commonly known as:  LOPRESSOR  Take 1 tablet (50 mg total) by mouth 2 (two) times daily.     pregabalin 50 MG capsule  Commonly known as:  LYRICA  Take 1 capsule (50 mg total) by mouth 3 (three) times daily.        Disposition and follow-up:   [name] was discharged from [facility] in Stable condition.  At the hospital follow up visit please address:  1.  DM type 2 uncontrolled: Patient's A1c 8.1% this admission. Continued her home does of metformin 500mg  bid and added glipizide long acting 2.5mg  daily. 2. Hyperlipidemia: Patient with elevated cholesterol this admission. Had previously taken statin, but no longer taking 2/2 patient preference. Pt amenable to  starting statin, so she was discharged with atorvastatin 40mg  daily.  2.  Labs / imaging needed at time of follow-up: none  3.  Pending labs/ test needing follow-up: final result of carotid doppler, BCx, UCx  Follow-up Appointments: Follow-up Information   Follow up with Dow Adolph, MD On 03/25/2013. (At 9:45am)    Specialty:  Internal Medicine   Contact information:   [address] [place] [address] [phone]       Discharge Instructions: Discharge Orders   Future Appointments Provider Department Dept Phone   03/25/2013 9:45 AM Dow Adolph, MD [facility] [phone]   Future Orders Complete By Expires   Call MD for:  difficulty breathing, headache or visual disturbances  As directed    Call MD for:  persistant dizziness or light-headedness  As directed    Diet - low sodium heart healthy  As directed    Increase activity slowly  As directed      Consultations:  neurology  Procedures Performed:  Dg Chest 2 View  03/10/2013   *RADIOLOGY REPORT*  Clinical Data: Stroke, weakness  CHEST - 2 VIEW  Comparison: Prior chest x-ray 03/10/2013 at 0009 hours  Findings: Mild chronic central bronchitic changes.  Cardiac and mediastinal contours within normal limits.  Negative for edema, focal  airspace consolidation, effusion or pneumothorax.  No acute osseous abnormality.  Surgical clips in the right upper quadrant suggest prior cholecystectomy.  Soft tissue anchor in the left humeral head from prior rotator cuff repair.  IMPRESSION: No acute cardiopulmonary disease.   Original Report Authenticated By: Malachy Moan, M.D.   Ct Head Wo Contrast  03/09/2013   CLINICAL DATA:  Slurred speech and left-sided weakness. History of stroke.  EXAM: CT HEAD WITHOUT CONTRAST  TECHNIQUE: Contiguous axial images were obtained from the base of the skull through the vertex without intravenous contrast.  COMPARISON:  MRI 08/12/2012. No prior CT.  FINDINGS:  Sinuses/Soft tissues: Left maxillary sinus mucous retention cyst or polyp. Clear mastoids  Intracranial: Mildly age advanced cerebral atrophy. Moderate low density in the periventricular white matter likely related to small vessel disease. Remote lacunar infarct in the left basal ganglia image 12/series 3. Slightly asymmetric right frontal hypoattenuation on image 19/ series 3 is felt to be similar to image 14/ series 7 of the prior MRI.  No hemorrhage, hydrocephalus, intra-axial, or extra-axial fluid collection.  IMPRESSION: 1.  No acute intracranial abnormality. 2. Age advanced cerebral atrophy and small vessel ischemic change in the periventricular white matter. No convincing evidence of acute superimposed infarct. These results were called by telephone at the time of interpretation on 03/09/2013 at 11:23 PM to Dr. Anitra Lauth , who verbally acknowledged these results.   Electronically Signed   By: Jeronimo Greaves   On: 03/09/2013 23:25   Mr Maxine Glenn Head Wo Contrast  03/10/2013   CLINICAL DATA:  62 year old female with history of diabetes and CHF. Left side weakness and slurred speech for 2 days. Initial encounter.  EXAM: MRI HEAD WITHOUT CONTRAST  MRA HEAD WITHOUT CONTRAST  TECHNIQUE: Multiplanar, multiecho pulse sequences of the brain and surrounding structures were obtained without intravenous contrast. Angiographic images of the head were obtained using MRA technique without contrast.  COMPARISON:  Head CT without contrast 03/09/2013. Brain MRI 08/12/2012.  FINDINGS: MRI HEAD FINDINGS  Stable cerebral volume. Small focus of increased trace diffusion in the right parietal lobe on series 3, image 24 not correlated on ADC map. Favor image noise. No restricted diffusion to suggest acute infarction. No midline shift, mass effect, evidence of mass lesion, ventriculomegaly, extra-axial collection or acute intracranial hemorrhage. Cervicomedullary junction and pituitary are within normal limits. Negative visualized cervical  spine. Major intracranial vascular flow voids are stable.  Patchy in confluent bilateral cerebral white matter T2 and FLAIR hyperintensity in a nonspecific pattern. A chronic lacunar infarct in the left corona radiata bordering on the external capsule and lentiform nuclei (series 6, image 14) has developed since the prior study. Confluent T2 and FLAIR hyperintensity in the pons is stable. Cerebellum remains within normal limits. Negative visualized internal auditory structures.  Normal bone marrow signal. Visualized orbit soft tissues are within normal limits. Maxillary sinus mucous retention cysts are stable. Negative scalp soft tissues.  MRA HEAD FINDINGS  Study is mildly to moderately degraded by motion artifact despite repeated imaging attempts.  Antegrade flow in the posterior circulation. Mildly dominant distal right vertebral artery. Normal right PICA origin. Irregular distal left vertebral artery but no definite hemodynamically significant stenosis. Patent vertebrobasilar junction. No basilar stenosis. SCA and PCA origins are within normal limits. Posterior communicating arteries are diminutive or absent. Bilateral PCA branches are within normal limits.  Antegrade flow in both ICA siphons. Tortuous distal cervical right ICA. ICA irregularity without definite stenosis. Carotid termini within normal limits. MCA and ACA  origins within normal limits. Dominant right ACA A1 segment. Anterior communicating artery and visualized ACA branches within normal limits. Visualized bilateral MCA branches within normal limits allowing for motion artifact. Left M1 segment superior branch infundibulum.  IMPRESSION: MRI HEAD IMPRESSION  1. No acute intracranial abnormality.  2. Age advanced chronic small vessel disease, progressed since March 2014.  MRA HEAD IMPRESSION  Motion degraded. Intracranial atherosclerosis suspected, but no significant intracranial stenosis or major circle of Willis branch occlusion identified.    Electronically Signed   By: Augusto Gamble M.D.   On: 03/10/2013 10:18   Mr Brain Wo Contrast  03/10/2013   CLINICAL DATA:  62 year old female with history of diabetes and CHF. Left side weakness and slurred speech for 2 days. Initial encounter.  EXAM: MRI HEAD WITHOUT CONTRAST  MRA HEAD WITHOUT CONTRAST  TECHNIQUE: Multiplanar, multiecho pulse sequences of the brain and surrounding structures were obtained without intravenous contrast. Angiographic images of the head were obtained using MRA technique without contrast.  COMPARISON:  Head CT without contrast 03/09/2013. Brain MRI 08/12/2012.  FINDINGS: MRI HEAD FINDINGS  Stable cerebral volume. Small focus of increased trace diffusion in the right parietal lobe on series 3, image 24 not correlated on ADC map. Favor image noise. No restricted diffusion to suggest acute infarction. No midline shift, mass effect, evidence of mass lesion, ventriculomegaly, extra-axial collection or acute intracranial hemorrhage. Cervicomedullary junction and pituitary are within normal limits. Negative visualized cervical spine. Major intracranial vascular flow voids are stable.  Patchy in confluent bilateral cerebral white matter T2 and FLAIR hyperintensity in a nonspecific pattern. A chronic lacunar infarct in the left corona radiata bordering on the external capsule and lentiform nuclei (series 6, image 14) has developed since the prior study. Confluent T2 and FLAIR hyperintensity in the pons is stable. Cerebellum remains within normal limits. Negative visualized internal auditory structures.  Normal bone marrow signal. Visualized orbit soft tissues are within normal limits. Maxillary sinus mucous retention cysts are stable. Negative scalp soft tissues.  MRA HEAD FINDINGS  Study is mildly to moderately degraded by motion artifact despite repeated imaging attempts.  Antegrade flow in the posterior circulation. Mildly dominant distal right vertebral artery. Normal right PICA origin.  Irregular distal left vertebral artery but no definite hemodynamically significant stenosis. Patent vertebrobasilar junction. No basilar stenosis. SCA and PCA origins are within normal limits. Posterior communicating arteries are diminutive or absent. Bilateral PCA branches are within normal limits.  Antegrade flow in both ICA siphons. Tortuous distal cervical right ICA. ICA irregularity without definite stenosis. Carotid termini within normal limits. MCA and ACA origins within normal limits. Dominant right ACA A1 segment. Anterior communicating artery and visualized ACA branches within normal limits. Visualized bilateral MCA branches within normal limits allowing for motion artifact. Left M1 segment superior branch infundibulum.  IMPRESSION: MRI HEAD IMPRESSION  1. No acute intracranial abnormality.  2. Age advanced chronic small vessel disease, progressed since March 2014.  MRA HEAD IMPRESSION  Motion degraded. Intracranial atherosclerosis suspected, but no significant intracranial stenosis or major circle of Willis branch occlusion identified.   Electronically Signed   By: Augusto Gamble M.D.   On: 03/10/2013 10:18   Dg Chest Portable 1 View  03/10/2013   CLINICAL DATA:  Hypertension. Diabetes. COPD.  EXAM: PORTABLE CHEST - 1 VIEW  COMPARISON:  08/11/2012  FINDINGS: Patient rotated to the right. Midline trachea. Heart size normal for level of inspiration. No pleural effusion or pneumothorax. Clear lungs.  IMPRESSION: No acute cardiopulmonary disease.  Electronically Signed   By: Jeronimo Greaves   On: 03/10/2013 00:44   Carotid doppler: preliminary read Bilateral: 1-39% ICA stenosis. Vertebral artery flow is antegrade.   2D Echo:  Study Conclusions  - Left ventricle: The cavity size was normal. There was mild concentric hypertrophy. Systolic function was normal. The estimated ejection fraction was in the range of 55% to 60%. Wall motion was normal; there were no regional wall motion abnormalities. Doppler  parameters are consistent with abnormal left ventricular relaxation (grade 1 diastolic dysfunction). - Atrial septum: No defect or patent foramen ovale was identified. - Pericardium, extracardiac: A trivial pericardial effusion was identified anterior to the heart. There was no evidence of hemodynamic compromise. Impressions:  - No cardiac source of embolism was identified, but cannot be ruled out on the basis of this examination.  Admission HPI:  [name] is a 62 y.o. y/o female w/ PMHx of HTN, DM type II (w/ neuropathy), CHF (grade II dCHF), CVA? (several years ago, no residual weakness), and COPD, presents to the ED w/ complaints of left sided weakness and slurred speech for two days.   According to the patient, she was not feeling well yesterday, saying she had a headache and some difficulty walking. Yesterday evening around 8:30 PM, she said she stood up to walk from a seated position and said her left arm and leg were completely weak, inhibiting her ability to stand and walk w/ accompanied slurred speech and facial weakness. The patient also claims she had associated numbness and tingling on the affected extremities as well as lightheadedness on standing for the past day or so. She denies any LOC, history of seizures, change in vision, or difficulty swallowing. She said she does not recall ever having symptoms like this in the past. En route to [facility], the patient was noted to have significant hypotension and on admission she was shown to have BP of 79/55. CT was performed in the ED and read by the radiologist as no acute intracranial abnormality, but with age advanced cerebral atrophy and small vessel ischemic change in the periventricular white matter. Per neurology note, the CT showed 2 hypodense areas in the L. Posterior frontal/periinsular region as well as R. Parietal/frontal region. On exam in the ED, the patient does not exhibit any remaining weakness, sensory losses, slurred speech or  facial droop.  The patient also claims to have some recent urinary urgency. In the ED, a UA was performed, showing glucose of 100, protein of 30, moderate leukocytes, 21-50 WBC's, and many bacteria. She otherwise denies any issues of increased frequency, dysuria, hematuria, or flank pain.   The patient otherwise denies any further issues. No complaints of palpitations, chest pain, SOB, cough, fever, chills, nausea, vomiting, abdominal pain, diarrhea, or constipation.  Hospital Course by problem list:   # TIA: Patient presented with CT symptoms concerning for acute CVA. CT brain showed two well defined hypodense areas at left posterior frontal/periinsular and right parietal-frontal regions, which is likely subacute per neurologist. However, MRI was negative for acute stroke, so patient's presenting symptoms may be explained by TIA. Patient presented with hypotension and it is possible that her neurologic symptoms were caused by cerebral hypoperfusion. Patient was evaluated by neurologist, Dr. Leroy Kennedy, who recommended stroke work up. Patient had negative carotid doppler and 2D echo. She was monitored on telemetry and no events. Troponins negative x 2. Patient takes ASA 81mg  at home and this was continued upon discharge. Antihypertensives were held, but restarted upon discharge  given increasing BPs. Atorvastatin 40mg  daily was initiated upon discharge. Patient was seen by PT and outpatient PT was ordered upon discharge.   # UTI and possible sepsis: Patient presented with a hypotension with a blood pressure 79/55. UA positive for many bacteria and 21-50 WBC per HPF, though patient with only symptom being urinary urgency, though patient has this at baseline. Patient received 1 dose of Rocephin, 1 g IV in ED and responded to 1 L of normal saline bolus in ED. On HD2 BPs were elevated (152-177/70-92). The patient has had normal WBC count and has been afebrile. She was treated with a 2 day course of rocephin, no abx  on discharge given pt's urinary urgency is her baseline, so thought to be asymptomatic bacteriuria. UCx pending at time of discharge.  # Hypertension: Patient presented with hypotension, responsive to IV fluid.  Held blood pressure medication in the setting of sepsis and TIA . BPs stable now and restarted antihypertensives at discharge.  #: DM2: Hgb A1C 8.1% on this admission. She is on metformin 500mg  BID at home. Held metformin while inpatient and managed her on SSI. Will start glipizide 2.5mg  daily (long acting formula) today as her diabetes is not controlled per A1c. Will f/u in [facility].  Discharge Vitals:   BP 189/127  Pulse 85  Temp(Src) 98.4 F (36.9 C) (Oral)  Resp 17  Ht 5\' 6"  (1.676 m)  Wt 94.7 kg (208 lb 12.4 oz)  BMI 33.71 kg/m2  SpO2 98%  Discharge Labs:  Results for orders placed during the hospital encounter of 03/09/13 (from the past 24 hour(s))  GLUCOSE, CAPILLARY     Status: Abnormal   Collection Time    03/10/13 12:22 PM      Result Value Range   Glucose-Capillary 131 (*) 70 - 99 mg/dL   Comment 1 Documented in Chart     Comment 2 Notify RN    GLUCOSE, CAPILLARY     Status: Abnormal   Collection Time    03/10/13  4:56 PM      Result Value Range   Glucose-Capillary 161 (*) 70 - 99 mg/dL   Comment 1 Documented in Chart     Comment 2 Notify RN    GLUCOSE, CAPILLARY     Status: Abnormal   Collection Time    03/10/13  9:01 PM      Result Value Range   Glucose-Capillary 215 (*) 70 - 99 mg/dL  GLUCOSE, CAPILLARY     Status: Abnormal   Collection Time    03/11/13  8:00 AM      Result Value Range   Glucose-Capillary 157 (*) 70 - 99 mg/dL  GLUCOSE, CAPILLARY     Status: Abnormal   Collection Time    03/11/13 11:40 AM      Result Value Range   Glucose-Capillary 142 (*) 70 - 99 mg/dL    Signed: Windell Hummingbird, MD 03/11/2013, 12:14 PM   Time Spent on Discharge: 35 minutes Services Ordered on Discharge: outpatient PT Equipment Ordered on Discharge: none

## 2013-03-12 ENCOUNTER — Other Ambulatory Visit: Payer: Self-pay | Admitting: *Deleted

## 2013-03-12 DIAGNOSIS — E119 Type 2 diabetes mellitus without complications: Secondary | ICD-10-CM

## 2013-03-12 DIAGNOSIS — R000 Tachycardia, unspecified: Secondary | ICD-10-CM

## 2013-03-12 LAB — URINE CULTURE: Colony Count: >=100000

## 2013-03-12 MED ORDER — PREGABALIN 50 MG PO CAPS: 50.0000 mg | ORAL_CAPSULE | Freq: Three times a day (TID) | ORAL | Status: DC

## 2013-03-12 MED ORDER — LISINOPRIL 20 MG PO TABS: 20.0000 mg | ORAL_TABLET | Freq: Every day | ORAL | Status: DC

## 2013-03-12 MED ORDER — DULOXETINE HCL 60 MG PO CPEP: 60.0000 mg | ORAL_CAPSULE | Freq: Every day | ORAL | Status: DC

## 2013-03-12 NOTE — Telephone Encounter (Signed)
Please assign a PCP

## 2013-03-12 NOTE — Telephone Encounter (Signed)
Rx was called in to pharmacy and message given to [facility] about assigning PCP.

## 2013-03-12 NOTE — Telephone Encounter (Signed)
Called to follow up with patient after she was discharged from the hospital. Home health nurse was with her at time of call. Patient agreed to meet with CDE October 7th after her doctor appointment.

## 2013-03-14 ENCOUNTER — Telehealth: Payer: Self-pay | Admitting: Dietician

## 2013-03-14 DIAGNOSIS — E1140 Type 2 diabetes mellitus with diabetic neuropathy, unspecified: Secondary | ICD-10-CM

## 2013-03-14 MED ORDER — ONETOUCH ULTRA MINI W/DEVICE KIT: 1.0000 | PACK | Freq: Two times a day (BID) | Status: DC

## 2013-03-14 MED ORDER — ONETOUCH DELICA LANCETS FINE MISC: 1.0000 | Freq: Two times a day (BID) | Status: DC

## 2013-03-14 MED ORDER — GLUCOSE BLOOD VI STRP: ORAL_STRIP | Status: DC

## 2013-03-14 NOTE — Telephone Encounter (Signed)
Discharge date:04-10-13 Call date: 03-14-13 Hospital follow up appointment date: 03-19-13  Calling to assist with transition of care from hospital to home.  Discharge medications reviewed: patient declined, did says she filled and started glipizide Able to fill all prescriptions?yes Patient aware of hospital follow up appointments. yes .  Other problems/concerns:needs glucometer, agreed to one touch ultra mini rx for meter, strips and lancets be sent to her drug store

## 2013-03-16 LAB — CULTURE, BLOOD (ROUTINE X 2): Culture: NO GROWTH

## 2013-03-19 ENCOUNTER — Ambulatory Visit (INDEPENDENT_AMBULATORY_CARE_PROVIDER_SITE_OTHER): Payer: BC Managed Care – PPO | Admitting: Internal Medicine

## 2013-03-19 ENCOUNTER — Ambulatory Visit: Payer: BC Managed Care – PPO | Admitting: Dietician

## 2013-03-19 ENCOUNTER — Encounter: Payer: Self-pay | Admitting: Dietician

## 2013-03-19 ENCOUNTER — Encounter: Payer: Self-pay | Admitting: Internal Medicine

## 2013-03-19 VITALS — BP 163/94 | HR 63 | Temp 98.6°F | Ht 66.0 in | Wt 209.1 lb

## 2013-03-19 DIAGNOSIS — R000 Tachycardia, unspecified: Secondary | ICD-10-CM

## 2013-03-19 DIAGNOSIS — Z0000 Encounter for general adult medical examination without abnormal findings: Secondary | ICD-10-CM

## 2013-03-19 DIAGNOSIS — N390 Urinary tract infection, site not specified: Secondary | ICD-10-CM

## 2013-03-19 DIAGNOSIS — E1149 Type 2 diabetes mellitus with other diabetic neurological complication: Secondary | ICD-10-CM

## 2013-03-19 DIAGNOSIS — E1142 Type 2 diabetes mellitus with diabetic polyneuropathy: Secondary | ICD-10-CM

## 2013-03-19 DIAGNOSIS — E1140 Type 2 diabetes mellitus with diabetic neuropathy, unspecified: Secondary | ICD-10-CM

## 2013-03-19 DIAGNOSIS — G459 Transient cerebral ischemic attack, unspecified: Secondary | ICD-10-CM

## 2013-03-19 DIAGNOSIS — I10 Essential (primary) hypertension: Secondary | ICD-10-CM

## 2013-03-19 MED ORDER — RELION LANCETS MICRO-THIN 33G MISC: 1.0000 | Freq: Every day | Status: DC

## 2013-03-19 MED ORDER — GLUCOSE BLOOD VI STRP: ORAL_STRIP | Status: DC

## 2013-03-19 MED ORDER — LISINOPRIL 20 MG PO TABS: 40.0000 mg | ORAL_TABLET | Freq: Every day | ORAL | Status: DC

## 2013-03-19 MED ORDER — RELION PRIME MONITOR DEVI: 1.0000 | Freq: Every day | Status: DC

## 2013-03-19 NOTE — Patient Instructions (Addendum)
General Instructions: I have increased your Lisinopril to 40 mg daily I encourage you to take Lipitor  Please follow up in 2 -3 weeks   Treatment Goals:  Goals (1 Years of Data) as of 03/19/13         As of Today 03/11/13 03/11/13 03/11/13 03/11/13     Blood Pressure    . Blood Pressure < 140/90  160/94 182/85 209/98 189/127 180/80     Lifestyle    . Prevent Falls           Result Component    . HEMOGLOBIN A1C < 7.0          . LDL CALC < 100            Progress Toward Treatment Goals:  Treatment Goal 03/19/2013  Hemoglobin A1C unchanged  Blood pressure deteriorated  Stop smoking smoking the same amount  Prevent falls unable to assess    Self Care Goals & Plans:  Self Care Goal 03/19/2013  Manage my medications take my medicines as prescribed; bring my medications to every visit; refill my medications on time  Monitor my health -  Eat healthy foods drink diet soda or water instead of juice or soda; eat more vegetables; eat foods that are low in salt; eat baked foods instead of fried foods  Be physically active -  Stop smoking -  Other -    Home Blood Glucose Monitoring 03/19/2013  Check my blood sugar 2 times a day  When to check my blood sugar before breakfast; before dinner     Care Management & Community Referrals:  Referral 03/19/2013  Referrals made for care management support diabetes educator  Referrals made to community resources -

## 2013-03-19 NOTE — Assessment & Plan Note (Signed)
Lab Results  Component Value Date   HGBA1C 8.1* 03/10/2013   HGBA1C 7.4 12/12/2012   HGBA1C 6.7 08/20/2012     Assessment: Diabetes control: fair control Progress toward A1C goal:  unchanged Comments: she did not bring her glucose meter today. Encouraged her to check a blood sugar 2 times a day  Plan: Medications:  continue current medications Home glucose monitoring: Frequency: 2 times a day Timing: before breakfast;before dinner Instruction/counseling given: reminded to bring blood glucose meter & log to each visit, reminded to bring medications to each visit and discussed the need for weight loss Educational resources provided: brochure Self management tools provided:   Other plans:  continue with glipizide 2.5 mg daily. Metformin 500 mg twice a day

## 2013-03-19 NOTE — Assessment & Plan Note (Addendum)
BP Readings from Last 3 Encounters:  03/19/13 163/94  03/11/13 182/85  12/12/12 112/64    Lab Results  Component Value Date   NA 140 03/09/2013   K 3.9 03/09/2013   CREATININE 1.10 03/09/2013    Assessment: Blood pressure control: moderately elevated Progress toward BP goal:  deteriorated Comments: lisinopril had been decreased from 40 mg to 20 mg during her recent hospitalization in September.  Plan: Medications:  Will increase her lisinopril from 20 mg back to 40 mg daily. Educational resources provided: brochure Self management tools provided:   Other plans: will followup in 2 weeks and further changes can be made.

## 2013-03-19 NOTE — Progress Notes (Signed)
Patient ID: [name], female   DOB: [birth year], 62 y.o.   MRN: [id number]   Subjective:   HPI: [name] is a 62 y.o. white woman, with past medical history of hypertension, DM, COPD, CHF, depression, neuromuscular disorder, presents to the clinic for hospital follow up visit.   She was admitted between 03/09/2013 to 03/11/2013 with a TIA and a questionable UTI. Brain MRI was negative. Carotid Dopplers and 2-D echo: Unremarkable. Her symptoms of facial droop and right-sided weakness, have significantly improved since discharge. She receives home PT. She has no new complaints today.  Blood pressure was found to be elevated at 163/94. Pulse 69. She reports to be compliant with her medications, including metoprolol 50 milligrams twice a day and lisinopril 20 mg daily. Initially, she was taking lisinopril 40 mg daily but this was reduced during her hospitalization due to low blood pressure.  Patient reports that she moved from [place] to [place] last year. Her previous PCP was prescribing Flexeril, Vicodin, and butalbital, but this was discontinued when she moved to [place]. She inquires about restarting these medications. Apparently, she takes butalbital for pressure headaches. She has to use an incliner when she goes to bed due these headache. I did not address these issues during this OV but requested for records from her PCP and these will be forwarded to Dr Darci Needle.  Past Medical History  Diagnosis Date  . Hypertension   . Diabetes mellitus without complication     diagnosed around 2010, only ever on metformin  . Chronic pain     neck pain, headache, neuropathy  . Hypertriglyceridemia   . Neuromuscular disorder   . COPD (chronic obstructive pulmonary disease)   . CHF (congestive heart failure)   . Depression    Current Outpatient Prescriptions  Medication Sig Dispense Refill  . atorvastatin (LIPITOR) 40 MG tablet Take 1 tablet (40 mg total) by mouth daily at 6 PM.   30 tablet  2  . DULoxetine (CYMBALTA) 60 MG capsule Take 1 capsule (60 mg total) by mouth daily.  30 capsule  0  . gabapentin (NEURONTIN) 600 MG tablet Take 1 tablet (600 mg total) by mouth 3 (three) times daily.  90 tablet  5  . glipiZIDE (GLUCOTROL XL) 2.5 MG 24 hr tablet Take 1 tablet (2.5 mg total) by mouth daily with breakfast.  30 tablet  2  . lisinopril (PRINIVIL,ZESTRIL) 20 MG tablet Take 2 tablets (40 mg total) by mouth daily.  30 tablet  5  . metFORMIN (GLUCOPHAGE) 500 MG tablet Take 500 mg by mouth 2 (two) times daily with a meal.      . metoprolol (LOPRESSOR) 50 MG tablet Take 1 tablet (50 mg total) by mouth 2 (two) times daily.  60 tablet  11  . ONETOUCH DELICA LANCETS FINE MISC 1 each by Does not apply route 2 (two) times daily. Dx code 250.00  100 each  12  . pregabalin (LYRICA) 50 MG capsule Take 1 capsule (50 mg total) by mouth 3 (three) times daily.  90 capsule  2   No current facility-administered medications for this visit.   Family History  Problem Relation Age of Onset  . Hyperlipidemia Mother   . Heart attack Father 56  . Hypertension Father   . Cancer Paternal Grandfather     Lung cancer  . Cancer Maternal Grandmother   . Heart attack Maternal Grandmother    History   Social History  . Marital Status: Widowed    Spouse Name:  N/A    Number of Children: N/A  . Years of Education: N/A   Social History Main Topics  . Smoking status: Current Every Day Smoker -- 0.25 packs/day for 41 years    Types: Cigarettes  . Smokeless tobacco: Never Used     Comment: Trying to cut back.  . Alcohol Use: No  . Drug Use: No  . Sexual Activity: None     Comment: not asked   Other Topics Concern  . None   Social History Narrative   Moved to [facility] from [place] Nov 13, 2013shortly after the death of her husband, to live with her daughter.  She notes significant life stress related to her 46 year-old grand-daughter with bipolar disorder.  She has previously worked as a  Associate Professor.   Review of Systems: Constitutional: Denies fever, chills, diaphoresis, appetite change and fatigue.  Respiratory: Denies SOB, DOE, cough, chest tightness, and wheezing. Denies chest pain. Cardiovascular: No chest pain, palpitations and leg swelling.  Gastrointestinal: No abdominal pain, nausea, vomiting, bloody stools Genitourinary: No dysuria, frequency, hematuria, or flank pain.  Musculoskeletal: No myalgias, back pain, joint swelling, arthralgias  Psych: No depression symptoms. No SI or SA.   Objective:  Physical Exam: Filed Vitals:   03/19/13 1023 03/19/13 1133  BP: 160/94 163/94  Pulse: 69 63  Temp: 98.6 F (37 C)   TempSrc: Oral   Height: 5\' 6"  (1.676 m)   Weight: 209 lb 1.6 oz (94.847 kg)   SpO2: 96%    General: Well nourished. No acute distress.  HEENT: Normal oral mucosa. MMM.  Lungs: mild bil wheezes. Heart: RRR; no extra sounds or murmurs  Abdomen: Non-distended, normal BS, soft, nontender; no hepatosplenomegaly  Extremities: No pedal edema. No joint swelling or tenderness. Neurologic: Normal EOM,  Alert and oriented x3. No obvious neurologic/cranial nerve deficits.  Assessment & Plan:  I have discussed my assessment and plan  with  my attending in the clinic, Dr. Josem Kaufmann as detailed under problem based charting.

## 2013-03-19 NOTE — Assessment & Plan Note (Signed)
Currently asymptomatic. Urine cultures revealed pansensitive Escherichia coli. Already received treatment with IV Rocephin.

## 2013-03-19 NOTE — Assessment & Plan Note (Signed)
Symptoms have significantly improved. It sounds like this was TIA, induced by hypotension. Brain MRI was negative. Emphasized to the patient to take her blood pressure medications and Lipitor. She will follow up in 2 weeks.

## 2013-03-19 NOTE — Progress Notes (Signed)
Patient seeing doctor today. Says she saw both Dr. Elmer Picker nad Dr Verne Carrow in the past year for eye exams. She also says the meter and strips were too expensive and [facility]'d [facility] meter and strips. Will request prescription for same to [facility] to [facility] on [address]

## 2013-03-20 ENCOUNTER — Telehealth: Payer: Self-pay | Admitting: *Deleted

## 2013-03-20 MED ORDER — LISINOPRIL 40 MG PO TABS: 40.0000 mg | ORAL_TABLET | Freq: Every day | ORAL | Status: DC

## 2013-03-20 NOTE — Progress Notes (Signed)
Case discussed with Dr. Kazibwe at the time of the visit.  We reviewed the resident's history and exam and pertinent patient test results.  I agree with the assessment, diagnosis and plan of care documented in the resident's note. 

## 2013-03-20 NOTE — Telephone Encounter (Signed)
Pt called to clarify dose of lisinopril  I talked with Dr Zada Girt and he wants pt to take lisinopril 20 mg 2 tabs in AM.  Pt informed.

## 2013-03-20 NOTE — Addendum Note (Signed)
Addended by: Dow Adolph on: 03/20/2013 05:03 PM   Modules accepted: Orders

## 2013-03-25 ENCOUNTER — Ambulatory Visit: Payer: BC Managed Care – PPO | Admitting: Internal Medicine

## 2013-04-11 ENCOUNTER — Other Ambulatory Visit: Payer: Self-pay | Admitting: *Deleted

## 2013-04-11 DIAGNOSIS — F329 Major depressive disorder, single episode, unspecified: Secondary | ICD-10-CM

## 2013-04-11 MED ORDER — DULOXETINE HCL 60 MG PO CPEP: 60.0000 mg | ORAL_CAPSULE | Freq: Every day | ORAL | Status: DC

## 2013-04-11 NOTE — Telephone Encounter (Signed)
Pt going out of town tomorrow and needs refill.

## 2013-04-15 ENCOUNTER — Ambulatory Visit: Payer: BC Managed Care – PPO | Admitting: Internal Medicine

## 2013-05-15 ENCOUNTER — Other Ambulatory Visit: Payer: Self-pay | Admitting: *Deleted

## 2013-05-15 MED ORDER — METFORMIN HCL 500 MG PO TABS: 500.0000 mg | ORAL_TABLET | Freq: Two times a day (BID) | ORAL | Status: DC

## 2013-05-27 ENCOUNTER — Other Ambulatory Visit: Payer: Self-pay | Admitting: *Deleted

## 2013-05-27 DIAGNOSIS — E119 Type 2 diabetes mellitus without complications: Secondary | ICD-10-CM

## 2013-05-27 MED ORDER — GLIPIZIDE ER 2.5 MG PO TB24: 2.5000 mg | ORAL_TABLET | Freq: Every day | ORAL | Status: DC

## 2013-05-27 MED ORDER — PREGABALIN 50 MG PO CAPS: 50.0000 mg | ORAL_CAPSULE | Freq: Three times a day (TID) | ORAL | Status: DC

## 2013-05-27 MED ORDER — METOPROLOL TARTRATE 50 MG PO TABS: 50.0000 mg | ORAL_TABLET | Freq: Two times a day (BID) | ORAL | Status: DC

## 2013-05-27 MED ORDER — GABAPENTIN 600 MG PO TABS: 600.0000 mg | ORAL_TABLET | Freq: Three times a day (TID) | ORAL | Status: DC

## 2013-05-27 NOTE — Telephone Encounter (Signed)
I was unsure about patient's regimen w/ both lyrica and gabapentin and I would like to discuss this at patient's next visit. Per chart review, it looks like patient has been on gabapentin for a long time, but lyrica was started 08/2012. Would like to assess the need for both of these medications.

## 2013-05-29 NOTE — Telephone Encounter (Signed)
Tried to call pt.  Not answering phone. Will try again

## 2013-05-31 NOTE — Telephone Encounter (Signed)
Another call to pt.  No answer. Want to clarify meds

## 2013-06-04 ENCOUNTER — Telehealth: Payer: Self-pay | Admitting: *Deleted

## 2013-06-04 NOTE — Telephone Encounter (Signed)
Pt called about Gabapentin/Glipizide rxs - [facility] pharmacy stated they were lasted filled 11/26; were too soon when pt had called but they will refil;l meds now. Also Lyrica rx (12/15) had not been called in - verbal given. Called pt - no answer; unable to leave message.

## 2013-06-14 ENCOUNTER — Encounter: Payer: Self-pay | Admitting: Internal Medicine

## 2013-06-14 ENCOUNTER — Ambulatory Visit (INDEPENDENT_AMBULATORY_CARE_PROVIDER_SITE_OTHER): Payer: BC Managed Care – PPO | Admitting: Internal Medicine

## 2013-06-14 VITALS — BP 136/87 | HR 85 | Temp 98.9°F | Ht 65.0 in | Wt 215.5 lb

## 2013-06-14 DIAGNOSIS — F32A Depression, unspecified: Secondary | ICD-10-CM

## 2013-06-14 DIAGNOSIS — E1140 Type 2 diabetes mellitus with diabetic neuropathy, unspecified: Secondary | ICD-10-CM

## 2013-06-14 DIAGNOSIS — F329 Major depressive disorder, single episode, unspecified: Secondary | ICD-10-CM

## 2013-06-14 DIAGNOSIS — IMO0002 Reserved for concepts with insufficient information to code with codable children: Secondary | ICD-10-CM

## 2013-06-14 DIAGNOSIS — E1149 Type 2 diabetes mellitus with other diabetic neurological complication: Secondary | ICD-10-CM

## 2013-06-14 DIAGNOSIS — E1142 Type 2 diabetes mellitus with diabetic polyneuropathy: Secondary | ICD-10-CM | POA: Insufficient documentation

## 2013-06-14 DIAGNOSIS — E1165 Type 2 diabetes mellitus with hyperglycemia: Principal | ICD-10-CM

## 2013-06-14 DIAGNOSIS — E781 Pure hyperglyceridemia: Secondary | ICD-10-CM

## 2013-06-14 DIAGNOSIS — Z0000 Encounter for general adult medical examination without abnormal findings: Secondary | ICD-10-CM

## 2013-06-14 DIAGNOSIS — I10 Essential (primary) hypertension: Secondary | ICD-10-CM

## 2013-06-14 DIAGNOSIS — F3289 Other specified depressive episodes: Secondary | ICD-10-CM

## 2013-06-14 LAB — COMPREHENSIVE METABOLIC PANEL
ALBUMIN: 4 g/dL (ref 3.5–5.2)
ALT: 21 U/L (ref 0–35)
AST: 13 U/L (ref 0–37)
Alkaline Phosphatase: 62 U/L (ref 39–117)
BUN: 19 mg/dL (ref 6–23)
CHLORIDE: 102 meq/L (ref 96–112)
CO2: 25 meq/L (ref 19–32)
Calcium: 10 mg/dL (ref 8.4–10.5)
Creat: 0.89 mg/dL (ref 0.50–1.10)
Glucose, Bld: 163 mg/dL — ABNORMAL HIGH (ref 70–99)
POTASSIUM: 4.6 meq/L (ref 3.5–5.3)
Sodium: 138 mEq/L (ref 135–145)
Total Bilirubin: 0.2 mg/dL — ABNORMAL LOW (ref 0.3–1.2)
Total Protein: 6.7 g/dL (ref 6.0–8.3)

## 2013-06-14 LAB — CBC
HCT: 42.7 % (ref 36.0–46.0)
HEMOGLOBIN: 14.5 g/dL (ref 12.0–15.0)
MCH: 31.3 pg (ref 26.0–34.0)
MCHC: 34 g/dL (ref 30.0–36.0)
MCV: 92.2 fL (ref 78.0–100.0)
Platelets: 227 10*3/uL (ref 150–400)
RBC: 4.63 MIL/uL (ref 3.87–5.11)
RDW: 13.7 % (ref 11.5–15.5)
WBC: 9.3 10*3/uL (ref 4.0–10.5)

## 2013-06-14 LAB — TSH: TSH: 2.309 u[IU]/mL (ref 0.350–4.500)

## 2013-06-14 LAB — POCT GLYCOSYLATED HEMOGLOBIN (HGB A1C): Hemoglobin A1C: 8.3

## 2013-06-14 LAB — GLUCOSE, CAPILLARY: Glucose-Capillary: 177 mg/dL — ABNORMAL HIGH (ref 70–99)

## 2013-06-14 MED ORDER — GABAPENTIN 600 MG PO TABS: 300.0000 mg | ORAL_TABLET | Freq: Three times a day (TID) | ORAL | Status: DC

## 2013-06-14 MED ORDER — METFORMIN HCL 500 MG PO TABS: ORAL_TABLET | ORAL | Status: DC

## 2013-06-14 NOTE — Assessment & Plan Note (Signed)
Patient refuses to take lipitor.

## 2013-06-14 NOTE — Assessment & Plan Note (Signed)
Refuses flu & pneumonia vaccines. Start with urine microalbumin today, consider colonoscopy referral at next follow up.

## 2013-06-14 NOTE — Assessment & Plan Note (Addendum)
Lab Results  Component Value Date   HGBA1C 8.3 06/14/2013   HGBA1C 8.1* 03/10/2013   HGBA1C 7.4 12/12/2012     Assessment: Diabetes control: fair control Progress toward A1C goal:  unchanged  Plan: Medications:  cont metformin 500mg  qAM but increase to 1000mg  qPM; cannot be more aggressive given periods of confusion and no glucometer ; memory lapses don't seem characteristic for hypoglycemia, but patient is on beta blocker. May need to consider another agent such as januvia instead of glipizide, which would be less prone to cause hypoglycemia Home glucose monitoring: Frequency: 3 times a day Timing: before breakfast;at bedtime;before dinner Instruction/counseling given: reminded to bring medications to each visit Educational resources provided: brochure;handout Self management tools provided:   Other plans: Urine microalbumin today

## 2013-06-14 NOTE — Assessment & Plan Note (Signed)
Given confusion, decrease gabapentin to 300mg  TID.  If unmanageable, will increase lyrica to 100mg  (from 50mg ) with plans to titrate gabapentin completely off if possible.

## 2013-06-14 NOTE — Telephone Encounter (Signed)
Lyrica called in

## 2013-06-14 NOTE — Assessment & Plan Note (Addendum)
Patient reports that cymbalta has not been effective for the last 3-4 months, increased depression with sleepiness all the time and decreased appetite.  This could be contributing to memory lapse.  Will start with TSH & CBC today.  Will decrease Gapabentin dose with concern for medication effect given also on lyrica.  Plan for cognitive testing with MOCA or SLUMS at next visit, and then may consider further dementia work up if warranted vs change depression regimen.

## 2013-06-14 NOTE — Patient Instructions (Signed)
General Instructions: -Regarding your diabetes, continue taking 500mg  in the morning, but increase to 1000mg  in the evening (2 tablets)  -Regarding your confusion and neuropathy, please decrease your gabapentin to 300mg  (half a tablet) three times daily.  We will plan to re-evaluate your neuropathy and do further cognitive testing at your next appointment  Please be sure to bring all of your medications with you to every visit.  Should you have any new or worsening symptoms, please be sure to call the clinic at [phone].  Treatment Goals:  Goals (1 Years of Data) as of 06/14/13         As of Today 03/19/13 03/19/13 03/11/13 03/11/13     Blood Pressure    . Blood Pressure < 140/90  136/87 163/94 160/94 182/85 209/98     Lifestyle    . Prevent Falls           Result Component    . HEMOGLOBIN A1C < 7.0  8.3        . LDL CALC < 100            Progress Toward Treatment Goals:  Treatment Goal 06/14/2013  Hemoglobin A1C unchanged  Blood pressure at goal  Stop smoking smoking the same amount  Prevent falls -    Self Care Goals & Plans:  Self Care Goal 06/14/2013  Manage my medications take my medicines as prescribed; bring my medications to every visit; refill my medications on time; follow the sick day instructions if I am sick  Monitor my health keep track of my blood pressure; check my feet daily  Eat healthy foods eat more vegetables; eat fruit for snacks and desserts; eat smaller portions; eat foods that are low in salt; drink diet soda or water instead of juice or soda  Be physically active find an activity I enjoy; take a walk every day  Stop smoking -  Other -    Home Blood Glucose Monitoring 06/14/2013  Check my blood sugar 3 times a day  When to check my blood sugar before breakfast; at bedtime; before dinner     Care Management & Community Referrals:  Referral 03/19/2013  Referrals made for care management support diabetes educator  Referrals made to community resources  -
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
## 2013-06-14 NOTE — Assessment & Plan Note (Signed)
BP Readings from Last 3 Encounters:  06/14/13 136/87  03/19/13 163/94  03/11/13 182/85    Lab Results  Component Value Date   NA 140 03/09/2013   K 3.9 03/09/2013   CREATININE 1.10 03/09/2013    Assessment: Blood pressure control: controlled Progress toward BP goal:  at goal  Plan: Medications:  continue current medications - lisinopril 40, metoprolol 50 bid Educational resources provided: brochure;handout;video

## 2013-06-15 LAB — MICROALBUMIN / CREATININE URINE RATIO
Creatinine, Urine: 208.9 mg/dL
Microalb Creat Ratio: 96.7 mg/g — ABNORMAL HIGH (ref 0.0–30.0)
Microalb, Ur: 20.2 mg/dL — ABNORMAL HIGH (ref 0.00–1.89)

## 2013-06-25 NOTE — Progress Notes (Signed)
Case discussed with Dr. Sharda soon after the resident saw the patient.  We reviewed the resident's history and exam and pertinent patient test results.  I agree with the assessment, diagnosis, and plan of care documented in the resident's note. 

## 2013-06-28 ENCOUNTER — Ambulatory Visit (INDEPENDENT_AMBULATORY_CARE_PROVIDER_SITE_OTHER): Payer: BC Managed Care – PPO | Admitting: Internal Medicine

## 2013-06-28 ENCOUNTER — Encounter: Payer: Self-pay | Admitting: Internal Medicine

## 2013-06-28 VITALS — BP 155/83 | HR 60 | Temp 97.6°F | Ht 65.0 in | Wt 215.1 lb

## 2013-06-28 DIAGNOSIS — R519 Headache, unspecified: Secondary | ICD-10-CM | POA: Insufficient documentation

## 2013-06-28 DIAGNOSIS — I509 Heart failure, unspecified: Secondary | ICD-10-CM

## 2013-06-28 DIAGNOSIS — I5032 Chronic diastolic (congestive) heart failure: Secondary | ICD-10-CM

## 2013-06-28 DIAGNOSIS — IMO0002 Reserved for concepts with insufficient information to code with codable children: Secondary | ICD-10-CM

## 2013-06-28 DIAGNOSIS — F329 Major depressive disorder, single episode, unspecified: Secondary | ICD-10-CM

## 2013-06-28 DIAGNOSIS — E1140 Type 2 diabetes mellitus with diabetic neuropathy, unspecified: Secondary | ICD-10-CM

## 2013-06-28 DIAGNOSIS — E1149 Type 2 diabetes mellitus with other diabetic neurological complication: Secondary | ICD-10-CM

## 2013-06-28 DIAGNOSIS — E1142 Type 2 diabetes mellitus with diabetic polyneuropathy: Secondary | ICD-10-CM

## 2013-06-28 DIAGNOSIS — E119 Type 2 diabetes mellitus without complications: Secondary | ICD-10-CM

## 2013-06-28 DIAGNOSIS — E1165 Type 2 diabetes mellitus with hyperglycemia: Secondary | ICD-10-CM

## 2013-06-28 DIAGNOSIS — R51 Headache: Secondary | ICD-10-CM

## 2013-06-28 DIAGNOSIS — I10 Essential (primary) hypertension: Secondary | ICD-10-CM

## 2013-06-28 LAB — GLUCOSE, CAPILLARY: GLUCOSE-CAPILLARY: 256 mg/dL — AB (ref 70–99)

## 2013-06-28 MED ORDER — HYDROCHLOROTHIAZIDE 12.5 MG PO CAPS: 12.5000 mg | ORAL_CAPSULE | Freq: Every day | ORAL | Status: DC

## 2013-06-28 MED ORDER — PREGABALIN 50 MG PO CAPS: 100.0000 mg | ORAL_CAPSULE | Freq: Three times a day (TID) | ORAL | Status: DC

## 2013-06-28 NOTE — Assessment & Plan Note (Addendum)
MOCA today was 22/30, did not have enough time to complete geri depression questionnaire, but I suspect her cognitive dysfunction may be secondary to depression - to readdress at next visit. To note, TSH and CBC were wnl. Also, no further memory lapses since last visit - likely secondary to taking lyrica and gabapentin at the same time, which she has now scattered.

## 2013-06-28 NOTE — Assessment & Plan Note (Signed)
BP Readings from Last 3 Encounters:  06/28/13 155/83  06/14/13 136/87  03/19/13 163/94    Lab Results  Component Value Date   NA 138 06/14/2013   K 4.6 06/14/2013   CREATININE 0.89 06/14/2013    Assessment: Blood pressure control:  elevated Progress toward BP goal:   deteriorated Comments: microalb/cr increased at last visit, BP remains uncontrolled most of the time, has been on hctz 25 in the past, not sure why d/c  Plan: Medications:  continue current medications - cont lisionpril 40 and metoprolol 50 bid; restart hctz 12.5 - titrate up as needed

## 2013-06-28 NOTE — Assessment & Plan Note (Signed)
Chronic HA with h/o neck problems - has been on butalbitol, vicodin and flexiril in the past - to readdress at next visit.

## 2013-06-28 NOTE — Assessment & Plan Note (Signed)
Patient with recent reports of chest discomfort with left arm pain.  May be secondary to asthma (as noted after coming in from the cold), but given h/o dCHF, DM, uncontrolled HTN, cannot r/o CAD.  No concerns for acute coronary syndrome at this time, but since she is high risk, cath may be more appropriate than stress test if indicated  -will refer to cardiology to evaluate for further work up.

## 2013-06-28 NOTE — Progress Notes (Signed)
Case discussed with Dr. Sharda soon after the resident saw the patient.  We reviewed the resident's history and exam and pertinent patient test results.  I agree with the assessment, diagnosis, and plan of care documented in the resident's note. 

## 2013-06-28 NOTE — Patient Instructions (Signed)
-  For your blood pressure, start taking hydrochlorothiazide (also called maxide) 12.5 mg daily -For your neuropathy/HA, decrease gabapentin to 300mg  three times daily, and increase lyrica to 100mg  three times daily -Also, given the description of your chest discomfort, I think it best we have you evaluated by a cardiologist, I have placed this referral tdoay  -At your next visit, we will readdress your headaches and mood.  Please be sure to bring all of your medications with you to every visit.  Should you have any new or worsening symptoms, please be sure to call the clinic at [phone].

## 2013-06-28 NOTE — Progress Notes (Signed)
Subjective:   Patient ID: [name] female   DOB: 25-Oct-1950 63 y.o.   MRN: [id number]  HPI: [name] is a 63 y.o. woman with history of TIA, DM, COPD, CHF, and depression. She presents today for follow up after I saw her on 06/14/13 for DM follow - today she returns for depression/cognitive testing. At last visit, we also decreased gabapentin to 300mg  TID.   Tried to decrease gabapentin but foot pain became unbearable - back too 600 TID.  Tried to increase metformin, but diarrhea ensued (1.5 of diarrhea) - back to 500 bid.   No more episodes of memory lapses and has been awake more - used to take gabapentin, then lyrica, then gabapentin, then lyrica - after getting out of hospital, started taking them together - now separating them again.   Reports persistent HA, believes this to be from her neck - wakes up with HA, sleeps with HA, at base of skull, throbbing pain -this is why she was taking flexeril, butalbital and Vicodin with relief at bedtime.  Two nights last week - came in from outside from walking dogs, had to sit down because chest and left arm hurt - described as ache, better with rest, no change in sweating, four times over the last year. In the past thought she had a small stroke. Denies ever having had a heart cath. Denies chest pressure when walking. Reports history of asthma, and used inhaler in the past. This incident is not isolated to cold weather.     Review of Systems: Constitutional: Denies fever, chills, diaphoresis, appetite change  HEENT: Denies photophobia, eye pain, redness, hearing loss, ear pain, congestion, sore throat, rhinorrhea, sneezing, mouth sores, trouble swallowing, neck stiffness and tinnitus.  Respiratory: Denies SOB, DOE, cough, chest tightness, and wheezing.  Cardiovascular: Denies chest pain, palpitations and leg swelling.  Gastrointestinal: Denies nausea, vomiting, abdominal pain, constipation,blood in stool and abdominal distention.    Genitourinary: Denies dysuria, urgency, frequency, hematuria, flank pain and difficulty urinating.  Skin: Denies pallor, rash and wound.  Neurological: Denies seizures, lightheadedness   Past Medical History  Diagnosis Date  . Hypertension   . Diabetes mellitus without complication     diagnosed around 2010, only ever on metformin  . Chronic pain     neck pain, headache, neuropathy  . Hypertriglyceridemia   . Neuromuscular disorder   . COPD (chronic obstructive pulmonary disease)   . CHF (congestive heart failure)   . Depression    Current Outpatient Prescriptions  Medication Sig Dispense Refill  . aspirin 81 MG tablet Take 81 mg by mouth daily.      . Blood Glucose Monitoring Suppl (RELION PRIME MONITOR) DEVI 1 each by Does not apply route daily. Dx code 250.00  1 Device  1  . DULoxetine (CYMBALTA) 60 MG capsule Take 1 capsule (60 mg total) by mouth daily.  30 capsule  11  . gabapentin (NEURONTIN) 600 MG tablet Take 0.5 tablets (300 mg total) by mouth 3 (three) times daily.  90 tablet  2  . glipiZIDE (GLUCOTROL XL) 2.5 MG 24 hr tablet Take 1 tablet (2.5 mg total) by mouth daily with breakfast.  30 tablet  2  . glucose blood (RELION PRIME TEST) test strip Check blood sugar daily Dx code 250.00  50 each  12  . lisinopril (PRINIVIL,ZESTRIL) 40 MG tablet Take 1 tablet (40 mg total) by mouth daily.  30 tablet  5  . metFORMIN (GLUCOPHAGE) 500 MG tablet 1 pill (500mg ) qAM,  2 pills (1000mg ) qPM  180 tablet  4  . metoprolol (LOPRESSOR) 50 MG tablet Take 1 tablet (50 mg total) by mouth 2 (two) times daily.  60 tablet  11  . ONETOUCH DELICA LANCETS FINE MISC 1 each by Does not apply route 2 (two) times daily. Dx code 250.00  100 each  12  . pregabalin (LYRICA) 50 MG capsule Take 1 capsule (50 mg total) by mouth 3 (three) times daily.  90 capsule  2  . RELION LANCETS MICRO-THIN 33G MISC 1 each by Does not apply route daily.  100 each  4   No current facility-administered medications for this  visit.   Family History  Problem Relation Age of Onset  . Hyperlipidemia Mother   . Heart attack Father 58  . Hypertension Father   . Cancer Paternal Grandfather     Lung cancer  . Cancer Maternal Grandmother   . Heart attack Maternal Grandmother    History   Social History  . Marital Status: Widowed    Spouse Name: N/A    Number of Children: N/A  . Years of Education: N/A   Social History Main Topics  . Smoking status: Current Every Day Smoker -- 0.25 packs/day for 41 years    Types: Cigarettes  . Smokeless tobacco: Never Used     Comment: Trying to cut back.  . Alcohol Use: No  . Drug Use: No  . Sexual Activity: None     Comment: not asked   Other Topics Concern  . None   Social History Narrative   Moved to [facility] from [place] Apr 22, 2012, shortly after the death of her husband, to live with her daughter.  She notes significant life stress related to her 61 year-old grand-daughter with bipolar disorder.  She has previously worked as a Occupational psychologist.    Objective:  Physical Exam: Filed Vitals:   06/28/13 0945  BP: 155/83  Pulse: 60  Temp: 97.6 F (36.4 C)  TempSrc: Oral  Height: 5\' 5"  (1.651 m)  Weight: 215 lb 1.6 oz (97.569 kg)  SpO2: 96%   HEENT: PERRL, EOMI, no scleral icterus  Cardiac: RRR, no rubs, murmurs or gallops  Pulm: clear to auscultation bilaterally, moving normal volumes of air  Abd: soft, nontender, nondistended, BS present  Ext: warm and well perfused, no pedal edema  Neuro: alert and oriented X3, cranial nerves II-XII grossly intact, MOCA 22/30 (scanned)  Assessment & Plan:  Case and care discussed with Dr. Lynnae January.  Please see problem oriented charting for further details. Patient to return in 2 weeks for depression & HA follow up (today's visit ended up focusing on HTN, cardaic sx, peripheral neuropathy med changes and MOCA was done)

## 2013-06-28 NOTE — Assessment & Plan Note (Addendum)
Unable to tolerate metformin 500 qAM and 1000 qPM - back to metformin 500 bid; if she agrees to at least having glucometer and supplies in case needed, may consider glipizide or [place]  Regarding neuropathy, she could not tolerate gabapentin 300 tid with lyrica 50 tid  --- increase lyrica to 100 tid, retry gabapentin 300 tid with plans to titrate gabapentin off

## 2013-07-05 ENCOUNTER — Encounter: Payer: Self-pay | Admitting: Interventional Cardiology

## 2013-07-05 ENCOUNTER — Ambulatory Visit (INDEPENDENT_AMBULATORY_CARE_PROVIDER_SITE_OTHER): Payer: BC Managed Care – PPO | Admitting: Interventional Cardiology

## 2013-07-05 VITALS — BP 176/92 | HR 71 | Ht 65.0 in | Wt 215.0 lb

## 2013-07-05 DIAGNOSIS — I509 Heart failure, unspecified: Secondary | ICD-10-CM

## 2013-07-05 DIAGNOSIS — E1149 Type 2 diabetes mellitus with other diabetic neurological complication: Secondary | ICD-10-CM

## 2013-07-05 DIAGNOSIS — I5032 Chronic diastolic (congestive) heart failure: Secondary | ICD-10-CM

## 2013-07-05 DIAGNOSIS — E1140 Type 2 diabetes mellitus with diabetic neuropathy, unspecified: Secondary | ICD-10-CM

## 2013-07-05 DIAGNOSIS — E781 Pure hyperglyceridemia: Secondary | ICD-10-CM

## 2013-07-05 DIAGNOSIS — E1142 Type 2 diabetes mellitus with diabetic polyneuropathy: Secondary | ICD-10-CM

## 2013-07-05 DIAGNOSIS — I10 Essential (primary) hypertension: Secondary | ICD-10-CM

## 2013-07-05 DIAGNOSIS — IMO0002 Reserved for concepts with insufficient information to code with codable children: Secondary | ICD-10-CM

## 2013-07-05 DIAGNOSIS — E1165 Type 2 diabetes mellitus with hyperglycemia: Secondary | ICD-10-CM

## 2013-07-05 DIAGNOSIS — R0789 Other chest pain: Secondary | ICD-10-CM

## 2013-07-05 MED ORDER — AMLODIPINE BESYLATE 5 MG PO TABS
5.0000 mg | ORAL_TABLET | Freq: Every day | ORAL | Status: DC
Start: 2013-07-05 — End: 2013-09-25

## 2013-07-05 NOTE — Progress Notes (Signed)
Patient ID: [name], female   DOB: 12-13-1950, 63 y.o.   MRN: [id number]   Date: 07/05/2013 ID: [name], DOB [birth year], MRN [id number] PCP: Rebecca Eaton, MD  Reason: Chest tightness  ASSESSMENT;  1. Probable angina pectoris described as exertional tightness  2. Hypertension, poorly controlled, due to poor compliance with diuretic regimen 3. Chronic diastolic heart failure, compensated at 4. poor compliance with diuretic regimen  5. Diabetes mellitus  PLAN:  1. Amlodipine 5 mg per day 2. I encouraged her to take the HCTZ daily as suggested that she flex her dose so that she can be home after she takes the medication to avoid urinary incontinence in public 3. Lexiscan nuclear perfusion study 4. Clinical followup in 6 weeks   SUBJECTIVE: [name] is a 63 y.o. female who is referred for evaluation and control of blood pressure. Since referral was made the patient had an episode of several days of chest tightness with activity. She states her blood pressures been difficult to control but also admits that she is not taking the diuretic because it causes her to have urinary incontinence when she is out in public. She denies orthopnea, PND, and extremity edema. Off hydrochlorothiazide urinary incontinence has resolved. After 3 days of exertional chest tightness he states that it is no longer present. This is a new complaint.   Allergies  Allergen Reactions  . Flexeril [Cyclobenzaprine] Other (See Comments)    Prolonged QTc to 571, tachycardia  . Soma [Carisoprodol] Itching and Rash    Current Outpatient Prescriptions on File Prior to Visit  Medication Sig Dispense Refill  . aspirin 81 MG tablet Take 81 mg by mouth daily.      . Blood Glucose Monitoring Suppl (RELION PRIME MONITOR) DEVI 1 each by Does not apply route daily. Dx code 250.00  1 Device  1  . DULoxetine (CYMBALTA) 60 MG capsule Take 1 capsule (60 mg total) by mouth daily.  30 capsule  11  .  gabapentin (NEURONTIN) 600 MG tablet Take 0.5 tablets (300 mg total) by mouth 3 (three) times daily.  90 tablet  2  . glipiZIDE (GLUCOTROL XL) 2.5 MG 24 hr tablet Take 1 tablet (2.5 mg total) by mouth daily with breakfast.  30 tablet  2  . glucose blood (RELION PRIME TEST) test strip Check blood sugar daily Dx code 250.00  50 each  12  . lisinopril (PRINIVIL,ZESTRIL) 40 MG tablet Take 1 tablet (40 mg total) by mouth daily.  30 tablet  5  . metoprolol (LOPRESSOR) 50 MG tablet Take 1 tablet (50 mg total) by mouth 2 (two) times daily.  60 tablet  11  . ONETOUCH DELICA LANCETS FINE MISC 1 each by Does not apply route 2 (two) times daily. Dx code 250.00  100 each  12  . pregabalin (LYRICA) 50 MG capsule Take 2 capsules (100 mg total) by mouth 3 (three) times daily.  180 capsule  2  . RELION LANCETS MICRO-THIN 33G MISC 1 each by Does not apply route daily.  100 each  4  . hydrochlorothiazide (MICROZIDE) 12.5 MG capsule Take 1 capsule (12.5 mg total) by mouth daily.  30 capsule  11   No current facility-administered medications on file prior to visit.    Past Medical History  Diagnosis Date  . Hypertension   . Diabetes mellitus without complication     diagnosed around 2010, only ever on metformin  . Chronic pain     neck pain, headache, neuropathy  .  Hypertriglyceridemia   . Neuromuscular disorder   . COPD (chronic obstructive pulmonary disease)   . CHF (congestive heart failure)   . Depression   . Puncture wound of foot, right 05/17/2012    Tetanus shot 3 yrs ago at [facility] in [place], per pt report     Past Surgical History  Procedure Laterality Date  . Rotator cuff repair    . Carpal tunnel release    . Abdominal hysterectomy    . Cholecystectomy      History   Social History  . Marital Status: Widowed    Spouse Name: N/A    Number of Children: N/A  . Years of Education: N/A   Occupational History  . Not on file.   Social History Main Topics  . Smoking status:  Current Every Day Smoker -- 0.25 packs/day for 41 years    Types: Cigarettes  . Smokeless tobacco: Never Used     Comment: Trying to cut back.  . Alcohol Use: No  . Drug Use: No  . Sexual Activity: Not on file     Comment: not asked   Other Topics Concern  . Not on file   Social History Narrative   Moved to [facility] from [place], shortly after the death of her husband, to live with her daughter.  She notes significant life stress related to her 104 year-old grand-daughter with bipolar disorder.  She has previously worked as a Occupational psychologist.    Family History  Problem Relation Age of Onset  . Hyperlipidemia Mother   . Heart attack Father 65  . Hypertension Father   . Cancer Paternal Grandfather     Lung cancer  . Cancer Maternal Grandmother   . Heart attack Maternal Grandmother     ROS: No neurological complaints. She denies constipation, melena, and hematemesis. No tissue area. She has not had syncope. She denies exertional leg discomfort. No chills or fever.. Other systems negative for complaints.  OBJECTIVE: BP 176/92  Pulse 71  Ht 5\' 5"  (1.651 m)  Wt 215 lb (97.523 kg)  BMI 35.78 kg/m2,  General: No acute distress, obese HEENT: normal no pallor or jaundice Neck: JVD flat. Carotids 2+, symmetric, without bruits Chest: Clear Cardiac: Murmur: 2 of 6 systolic murmur right upper sternal border. Gallop: S4 gallop. Rhythm: Regular. Other: Normal Abdomen: Bruit: Absent. Pulsation: Absent Extremities: Edema: Absent. Pulses: 2+ posterior tibial bilateral. 2+ radials. Neuro: Normal Psych: Changes  ECG: Normal sinus rhythm, LVH with strain.

## 2013-07-05 NOTE — Patient Instructions (Signed)
Start Amlodipine 5mg  daily. An Rx has been sent your pharmacy.  Restart HCTZ 12.5mg  daily. If you are out ( take when you return home), you may take it at different times during the day.  Your physician has requested that you have a lexiscan myoview. For further information please visit [email or web address]. Please follow instruction sheet, as given.  Your physician recommends that you schedule a follow-up appointment in: 6 weeks

## 2013-07-15 ENCOUNTER — Ambulatory Visit: Payer: BC Managed Care – PPO | Admitting: Internal Medicine

## 2013-07-18 ENCOUNTER — Ambulatory Visit (HOSPITAL_COMMUNITY): Payer: BC Managed Care – PPO | Attending: Cardiology | Admitting: Radiology

## 2013-07-18 ENCOUNTER — Encounter: Payer: Self-pay | Admitting: Cardiology

## 2013-07-18 VITALS — BP 143/81 | HR 75 | Ht 65.0 in | Wt 214.0 lb

## 2013-07-18 DIAGNOSIS — Z8249 Family history of ischemic heart disease and other diseases of the circulatory system: Secondary | ICD-10-CM | POA: Insufficient documentation

## 2013-07-18 DIAGNOSIS — I779 Disorder of arteries and arterioles, unspecified: Secondary | ICD-10-CM | POA: Insufficient documentation

## 2013-07-18 DIAGNOSIS — J4489 Other specified chronic obstructive pulmonary disease: Secondary | ICD-10-CM | POA: Insufficient documentation

## 2013-07-18 DIAGNOSIS — E119 Type 2 diabetes mellitus without complications: Secondary | ICD-10-CM | POA: Insufficient documentation

## 2013-07-18 DIAGNOSIS — I10 Essential (primary) hypertension: Secondary | ICD-10-CM | POA: Insufficient documentation

## 2013-07-18 DIAGNOSIS — R0789 Other chest pain: Secondary | ICD-10-CM

## 2013-07-18 DIAGNOSIS — R0602 Shortness of breath: Secondary | ICD-10-CM | POA: Insufficient documentation

## 2013-07-18 DIAGNOSIS — I509 Heart failure, unspecified: Secondary | ICD-10-CM | POA: Insufficient documentation

## 2013-07-18 DIAGNOSIS — E785 Hyperlipidemia, unspecified: Secondary | ICD-10-CM | POA: Insufficient documentation

## 2013-07-18 DIAGNOSIS — J449 Chronic obstructive pulmonary disease, unspecified: Secondary | ICD-10-CM | POA: Insufficient documentation

## 2013-07-18 DIAGNOSIS — F17200 Nicotine dependence, unspecified, uncomplicated: Secondary | ICD-10-CM | POA: Insufficient documentation

## 2013-07-18 DIAGNOSIS — R079 Chest pain, unspecified: Secondary | ICD-10-CM | POA: Insufficient documentation

## 2013-07-18 DIAGNOSIS — Z8673 Personal history of transient ischemic attack (TIA), and cerebral infarction without residual deficits: Secondary | ICD-10-CM | POA: Insufficient documentation

## 2013-07-18 MED ORDER — REGADENOSON 0.4 MG/5ML IV SOLN
0.4000 mg | Freq: Once | INTRAVENOUS | Status: AC
  Administered 2013-07-18: 0.4 mg via INTRAVENOUS

## 2013-07-18 MED ORDER — TECHNETIUM TC 99M SESTAMIBI GENERIC - CARDIOLITE
33.0000 | Freq: Once | INTRAVENOUS | Status: AC | PRN
  Administered 2013-07-18: 33 via INTRAVENOUS

## 2013-07-18 MED ORDER — TECHNETIUM TC 99M SESTAMIBI GENERIC - CARDIOLITE
11.0000 | Freq: Once | INTRAVENOUS | Status: AC | PRN
  Administered 2013-07-18: 11 via INTRAVENOUS

## 2013-07-18 NOTE — Progress Notes (Signed)
[facility] [address] [place], [place] [address] [phone]    Cardiology Nuclear Med Study  [name] is a 63 y.o. female     MRN : [id number]     DOB: 08/11/1950  Procedure Date: 07/18/2013  Nuclear Med Background Indication for Stress Test:  Evaluation for Ischemia History:  No known CAD, Echo 2013 EF 55-60%, Asthma, COPD, CHF Cardiac Risk Factors: Carotid Disease, Family History - CAD, Hypertension, Lipids, NIDDM, Smoker and TIA  Symptoms:  Chest Pain and SOB   Nuclear Pre-Procedure Caffeine/Decaff Intake:  None > 12 hrs NPO After: 7:00pm   Lungs:  clear O2 Sat: 94% on room air. IV 0.9% NS with Angio Cath:  22g  IV Site: R Forearm, tolerated well IV Started by:  Irven Baltimore, RN  Chest Size (in):  34 Cup Size: C  Height: 5\' 5"  (1.651 m)  Weight:  214 lb (97.07 kg)  BMI:  Body mass index is 35.61 kg/(m^2). Tech Comments:  Held Glipizide and took Metformin this am. Held Lopressor today.    Nuclear Med Study 1 or 2 day study: 1 day  Stress Test Type:  Carlton Adam  Reading MD: N/A  Order Authorizing Provider:  Daneen Schick, III  Resting Radionuclide: Technetium 9m Sestamibi  Resting Radionuclide Dose: 11.0 mCi   Stress Radionuclide:  Technetium 70m Sestamibi  Stress Radionuclide Dose: 33.0 mCi           Stress Protocol Rest HR: 75 Stress HR: 95  Rest BP: 143/81 Stress BP: 157/89  Exercise Time (min): n/a METS: n/a           Dose of Adenosine (mg):  n/a Dose of Lexiscan: 0.4 mg  Dose of Atropine (mg): n/a Dose of Dobutamine: n/a mcg/kg/min (at max HR)  Stress Test Technologist: Glade Lloyd, BS-ES  Nuclear Technologist:  Annye Rusk, CNMT     Rest Procedure:  Myocardial perfusion imaging was performed at rest 45 minutes following the intravenous administration of Technetium 71m Sestamibi. Rest ECG: NSR with LVH, TWI inferolateral leads.   Stress Procedure:  The patient received IV Lexiscan 0.4 mg over 15-seconds.   Technetium 58m Sestamibi injected at 30-seconds.  Quantitative spect images were obtained after a 45 minute delay.  During the infusion of Lexiscan, the patient complained of SOB.  This resolved in recovery.  Stress ECG: No significant change from baseline ECG  QPS Raw Data Images:  There is a breast shadow that accounts for the anterior attenuation. Stress Images:  There is mild decreased uptake in the mid to distal anterior wall distribution likely secondary to breast attenuation.  Rest Images:  There is mild decreased uptake in the mid to distal anterior wall distribution likely secondary to breast attenuation. Subtraction (SDS):  No evidence of ischemia. Transient Ischemic Dilatation (Normal <1.22):  1.06 Lung/Heart Ratio (Normal <0.45):  .24  Quantitative Gated Spect Images QGS EDV:  82 ml QGS ESV:  32 ml  Impression Exercise Capacity:  Lexiscan with no exercise. BP Response:  Normal blood pressure response. Clinical Symptoms:  There is dyspnea. ECG Impression:  No significant ST segment change suggestive of ischemia. Comparison with Prior Nuclear Study: No previous nuclear study performed  Overall Impression:  Low risk stress nuclear study with anterior wall mild defect most likely secondary to breast attenuation. .  LV Ejection Fraction: 61%.  LV Wall Motion:  NL LV Function; NL Wall Motion Sensitivity and specificity of the study is reduced by noted attenuation. If  symptoms persist, consider further cardiac testing.    Candee Furbish, MD

## 2013-07-22 ENCOUNTER — Ambulatory Visit (INDEPENDENT_AMBULATORY_CARE_PROVIDER_SITE_OTHER): Payer: BC Managed Care – PPO | Admitting: Internal Medicine

## 2013-07-22 ENCOUNTER — Encounter: Payer: Self-pay | Admitting: Internal Medicine

## 2013-07-22 VITALS — BP 121/73 | HR 89 | Temp 97.4°F | Ht 65.0 in | Wt 214.6 lb

## 2013-07-22 DIAGNOSIS — E1142 Type 2 diabetes mellitus with diabetic polyneuropathy: Secondary | ICD-10-CM

## 2013-07-22 DIAGNOSIS — E119 Type 2 diabetes mellitus without complications: Secondary | ICD-10-CM

## 2013-07-22 DIAGNOSIS — F17200 Nicotine dependence, unspecified, uncomplicated: Secondary | ICD-10-CM

## 2013-07-22 DIAGNOSIS — E1165 Type 2 diabetes mellitus with hyperglycemia: Principal | ICD-10-CM

## 2013-07-22 DIAGNOSIS — IMO0002 Reserved for concepts with insufficient information to code with codable children: Secondary | ICD-10-CM

## 2013-07-22 DIAGNOSIS — R51 Headache: Secondary | ICD-10-CM

## 2013-07-22 DIAGNOSIS — E1140 Type 2 diabetes mellitus with diabetic neuropathy, unspecified: Secondary | ICD-10-CM

## 2013-07-22 DIAGNOSIS — E1149 Type 2 diabetes mellitus with other diabetic neurological complication: Secondary | ICD-10-CM

## 2013-07-22 DIAGNOSIS — I10 Essential (primary) hypertension: Secondary | ICD-10-CM

## 2013-07-22 LAB — GLUCOSE, CAPILLARY: Glucose-Capillary: 212 mg/dL — ABNORMAL HIGH (ref 70–99)

## 2013-07-22 MED ORDER — GABAPENTIN 600 MG PO TABS
600.0000 mg | ORAL_TABLET | Freq: Three times a day (TID) | ORAL | Status: DC
Start: 2013-07-22 — End: 2013-09-25

## 2013-07-22 MED ORDER — BUTALBITAL-APAP-CAFFEINE 50-325-40 MG PO TABS: 1.0000 | ORAL_TABLET | Freq: Every evening | ORAL | Status: DC | PRN

## 2013-07-22 MED ORDER — PREGABALIN 50 MG PO CAPS: 50.0000 mg | ORAL_CAPSULE | Freq: Three times a day (TID) | ORAL | Status: DC

## 2013-07-22 NOTE — Assessment & Plan Note (Signed)
Patient continues to smoke approximately 1/2 pack per day. She is not interesting in cutting back or quitting now. She reports she has cut back and used to smoke much more than this.

## 2013-07-22 NOTE — Assessment & Plan Note (Signed)
Patient complains of chronic headaches. I suspect these headaches are tension headaches and may be related to muscle tension in her neck. They do not sound like migraine headaches in my opinion. Given Fioricet worked for patient in the past, we will try Fioricet to be taken each night to help with sleep. If this does not work, I would recommend trying a muscle relaxant. Of note, patient reports that she cannot take Flexeril anymore. Perhaps we can try Robaxin in the future.

## 2013-07-22 NOTE — Patient Instructions (Addendum)
Thank you for your visit.   Today we changed your gabapentin to 600mg  three times per day and we decreased your lyrica to 50mg  three times per day. Please stagger these medications to avoid sedation and confusion.  I prescribed fioricet to be taken each night as needed for your headache.  Please return to clinic in 1 month for a follow up of your headaches.

## 2013-07-22 NOTE — Assessment & Plan Note (Signed)
Patient continues to complain of numbness and tingling to her bilateral feet. Given these symptoms were more well controlled on her prior regimen, we will go back to the regimen she had been on previously: gabapentin 600 mg 3 times a day and Lyrica 50 mg 3 times a day. I asked the patient to stagger the way she takes the gabapentin and lyrica to decrease the risk of confusion and drowsiness. She agrees. We will continue metformin 500 mg twice a day. Of note, patient continues to refuse Lipitor. Foot exam was performed today.

## 2013-07-22 NOTE — Assessment & Plan Note (Signed)
BP Readings from Last 3 Encounters:  07/22/13 121/73  07/18/13 143/81  07/05/13 176/92    Lab Results  Component Value Date   NA 138 06/14/2013   K 4.6 06/14/2013   CREATININE 0.89 06/14/2013    Assessment: Blood pressure control: controlled Progress toward BP goal:  improved Comments:   Plan: Medications:  continue current medications Educational resources provided:   Self management tools provided:   Other plans: continue amlodipine 5mg  daily, metoprolol 50mg  BID, HCTZ 12.5mg  daily, lisinopril 40mg  daily

## 2013-07-22 NOTE — Progress Notes (Signed)
Patient ID: [name], female   DOB: [birth year], 63 y.o.   MRN: [id number] HPI The patient is a 63 y.o. female with a history of TIA, DM w/ neuropathy, CHF, chronic HA and depression who presents for a two week follow up appointment.  HA- Patient's main complaint today is her chronic headaches. Patient had been previously managed in [place] on butalbitoll, Vicodin, Flexeril. She was taken off of all these medications when she moved to [place]. Patient reports that she has daily dull, aching headaches located in her posterior bilateral scalp that are constant in nature (she is unable to approximate how long they last, but she does say they sometimes last all day). The headaches are worse at night when she lies down. She tells me that she has to sleep in a recliner because putting her head back onto a pillow makes the headache worse. Her headaches are not associated with nausea, vomiting, vision changes, neurologic signs or symptoms. She is taken ibuprofen and other NSAIDs in the past without relief of her symptoms. She is also taken Fioricet in the past, which did help her headache. Patient thinks her HA may be related to her neck pain and prior b/l rotator cuff surgeries. She is requesting something to take at night to help her HAs.  DM-Patient currently takes metformin 500 mg twice a day and glipizide 2.5 mg daily. Patient has undergone a trial of metformin 500mg  each morning and 1000 mg each evening, but could not tolerate this secondary to diarrhea. Patient denies having symptoms of hypoglycemia. She does not check her blood sugar at home because she cannot afford the test strips. She does own a glucometer, which she states is her husband's though she does not know the brand. Patient has peripheral neuropathy primarily located in her feet bilaterally. She states this is a pins and needles sensation that is improved with gabapentin and Lyrica combination. However, there was concern that the  gabapentin/lyrica combination was causing increased drowsiness and confusion. Patient's regimen was altered during her last visit in January and she is now taking Lyrica 100 mg 3 times a day and gabapentin 300 mg 3 times a day. However, patient reports that her pain was better controlled when she was taking gabapentin 600 mg 3 times a day and Lyrica 50 mg 3 times a day. She would like to go back to this regimen. There was concern about confusion on this regimen, but it seems as though the confusion resolved when patient scattered the dosing so she alternated the gabapentin and Lyrica doses throughout the day.  HTN- Patient is managed on HCTZ 12.5 mg daily, metoprolol 50 mg twice a day, lisinopril 40 mg daily. HCTZ was added at her last visit in January. Additionally, patient was evaluated by cardiology on January 23 for chest pain. At this visit cardiologist added amlodipine 5 mg daily. Patient has been compliant with all of these medications. Blood pressure today is much improved at 121/73. Patient also had a lexiscan myoview done by her cardiologist on 07/18/2013, which was low risk w/ mild defect to anterior wall likely 2/2 breast attenuation. She has a f/u appt w/ cardiology on 08/19/2013.   ROS: General: no fevers, chills, changes in weight Skin: no rash HEENT: +HA; no blurry vision, sore throat Pulm: no dyspnea, coughing, wheezing CV: no chest pain, palpitations, shortness of breath Abd: no nausea, vomiting, diarrhea, constipation, abd pain GU: no dysuria, hematuria, polyuria  Ext: no arthralgias, myalgias Neuro: +numbness/tingling to b/l feet  Filed Vitals:   07/22/13 1450  BP: 121/73  Pulse: 89  Temp: 97.4 F (36.3 C)  Weight stable at 214lbs SpO2 95%  Physical Exam: General: alert, cooperative, and in no apparent distress HEENT: pupils equal round and reactive to light, vision grossly intact, oropharynx clear and non-erythematous; MMM Neck: supple; mild TTP over b/l paraspinal  muscles Lungs: clear to ascultation bilaterally, normal work of respiration Heart: regular rate and rhythm, no murmurs, gallops, or rubs Abdomen: soft, non-tender, non-distended, normal bowel sounds Extremities: warm b/l, no pedal edema Neurologic: alert & oriented X3, cranial nerves II-XII grossly intact, strength grossly intact, sensation intact to light touch  Current Outpatient Prescriptions on File Prior to Visit  Medication Sig Dispense Refill  . amLODipine (NORVASC) 5 MG tablet Take 1 tablet (5 mg total) by mouth daily.  30 tablet  11  . aspirin 81 MG tablet Take 81 mg by mouth daily.      . DULoxetine (CYMBALTA) 60 MG capsule Take 1 capsule (60 mg total) by mouth daily.  30 capsule  11  . gabapentin (NEURONTIN) 600 MG tablet Take 0.5 tablets (300 mg total) by mouth 3 (three) times daily.  90 tablet  2  . glipiZIDE (GLUCOTROL XL) 2.5 MG 24 hr tablet Take 1 tablet (2.5 mg total) by mouth daily with breakfast.  30 tablet  2  . hydrochlorothiazide (MICROZIDE) 12.5 MG capsule Take 1 capsule (12.5 mg total) by mouth daily.  30 capsule  11  . lisinopril (PRINIVIL,ZESTRIL) 40 MG tablet Take 1 tablet (40 mg total) by mouth daily.  30 tablet  5  . metFORMIN (GLUCOPHAGE) 500 MG tablet 1 pill (500mg ) qAM, 2 pills (500mg ) qPM      . metoprolol (LOPRESSOR) 50 MG tablet Take 1 tablet (50 mg total) by mouth 2 (two) times daily.  60 tablet  11  . pregabalin (LYRICA) 50 MG capsule Take 2 capsules (100 mg total) by mouth 3 (three) times daily.  180 capsule  2  . Blood Glucose Monitoring Suppl (RELION PRIME MONITOR) DEVI 1 each by Does not apply route daily. Dx code 250.00  1 Device  1  . glucose blood (RELION PRIME TEST) test strip Check blood sugar daily Dx code 250.00  50 each  12  . ONETOUCH DELICA LANCETS FINE MISC 1 each by Does not apply route 2 (two) times daily. Dx code 250.00  100 each  12  . RELION LANCETS MICRO-THIN 33G MISC 1 each by Does not apply route daily.  100 each  4   No current  facility-administered medications on file prior to visit.    Assessment/Plan

## 2013-07-23 ENCOUNTER — Telehealth: Payer: Self-pay

## 2013-07-23 NOTE — Telephone Encounter (Signed)
Message copied by Lamar Laundry on Tue Jul 23, 2013  3:25 PM ------      Message from: Daneen Schick      Created: Fri Jul 19, 2013 11:09 AM       Inform her that the nuclear study was low risk, i.e. low likelihood that she has any significant blockage. We'll see her in followup as already scheduled. ------

## 2013-07-23 NOTE — Telephone Encounter (Signed)
pt given results of nuclear stress test .the nuclear study was low risk, i.e. low likelihood that she has any significant blockage. We'll see her in followup as already scheduled.pt verbalized understanding.

## 2013-07-23 NOTE — Progress Notes (Signed)
Case discussed with Dr. Mechele Claude at the time of the visit.  We reviewed the resident's history and exam and pertinent patient test results.  I agree with the assessment, diagnosis, and plan of care documented in the resident's note.

## 2013-08-19 ENCOUNTER — Encounter: Payer: Self-pay | Admitting: Interventional Cardiology

## 2013-08-19 ENCOUNTER — Ambulatory Visit (INDEPENDENT_AMBULATORY_CARE_PROVIDER_SITE_OTHER): Payer: BC Managed Care – PPO | Admitting: Interventional Cardiology

## 2013-08-19 VITALS — BP 142/88 | HR 68 | Ht 66.0 in | Wt 213.4 lb

## 2013-08-19 DIAGNOSIS — E1140 Type 2 diabetes mellitus with diabetic neuropathy, unspecified: Secondary | ICD-10-CM

## 2013-08-19 DIAGNOSIS — IMO0002 Reserved for concepts with insufficient information to code with codable children: Secondary | ICD-10-CM

## 2013-08-19 DIAGNOSIS — R0989 Other specified symptoms and signs involving the circulatory and respiratory systems: Secondary | ICD-10-CM

## 2013-08-19 DIAGNOSIS — E1165 Type 2 diabetes mellitus with hyperglycemia: Secondary | ICD-10-CM

## 2013-08-19 DIAGNOSIS — I509 Heart failure, unspecified: Secondary | ICD-10-CM

## 2013-08-19 DIAGNOSIS — R0600 Dyspnea, unspecified: Secondary | ICD-10-CM

## 2013-08-19 DIAGNOSIS — R0789 Other chest pain: Secondary | ICD-10-CM

## 2013-08-19 DIAGNOSIS — I5032 Chronic diastolic (congestive) heart failure: Secondary | ICD-10-CM

## 2013-08-19 DIAGNOSIS — E1142 Type 2 diabetes mellitus with diabetic polyneuropathy: Secondary | ICD-10-CM

## 2013-08-19 DIAGNOSIS — E1149 Type 2 diabetes mellitus with other diabetic neurological complication: Secondary | ICD-10-CM

## 2013-08-19 DIAGNOSIS — R0609 Other forms of dyspnea: Secondary | ICD-10-CM

## 2013-08-19 DIAGNOSIS — I10 Essential (primary) hypertension: Secondary | ICD-10-CM

## 2013-08-19 NOTE — Progress Notes (Signed)
Patient ID: [name], female   DOB: May 02, 1951, 63 y.o.   MRN: [id number]    [address], [address], [place]  [address] Phone: [phone] Fax:  [phone]  Date:  08/19/2013   ID:  Carl Bleecker, DOB [birth year], MRN [id number]  PCP:  Rebecca Eaton, MD   ASSESSMENT:  1. Dyspnea, persistent and perhaps worse 2. Chest tightness with low risk myocardial perfusion study 3. COPD 4. Chronic diastolic heart failure  PLAN:  1. Pulmonary function studies 2. If significant pulmonary diseases not found, she will need to have coronary angiography   SUBJECTIVE: [name] is a 63 y.o. female complaining of persistent dyspnea on exertion and chest discomfort despite a low risk of myocardial perfusion study. She does have a significant smoking history and the possibility of significant underlying pulmonary disease has not been excluded. She denies orthopnea, PND, edema, and other complaints. Diuretic therapy has not improved dyspnea.   Wt Readings from Last 3 Encounters:  08/19/13 213 lb 6.4 oz (96.798 kg)  07/22/13 214 lb 9.6 oz (97.342 kg)  07/18/13 214 lb (97.07 kg)     Past Medical History  Diagnosis Date  . Hypertension   . Diabetes mellitus without complication     diagnosed around 2010, only ever on metformin  . Chronic pain     neck pain, headache, neuropathy  . Hypertriglyceridemia   . Neuromuscular disorder   . COPD (chronic obstructive pulmonary disease)   . CHF (congestive heart failure)   . Depression   . Puncture wound of foot, right 05/17/2012    Tetanus shot 3 yrs ago at [facility] in [place], per pt report     Current Outpatient Prescriptions  Medication Sig Dispense Refill  . amLODipine (NORVASC) 5 MG tablet Take 1 tablet (5 mg total) by mouth daily.  30 tablet  11  . aspirin 81 MG tablet Take 81 mg by mouth daily.      . Blood Glucose Monitoring Suppl (RELION PRIME MONITOR) DEVI 1 each by Does not apply route daily. Dx code 250.00   1 Device  1  . butalbital-acetaminophen-caffeine (FIORICET) 50-325-40 MG per tablet Take 1-2 tablets by mouth at bedtime as needed for headache.  30 tablet  0  . DULoxetine (CYMBALTA) 60 MG capsule Take 1 capsule (60 mg total) by mouth daily.  30 capsule  11  . gabapentin (NEURONTIN) 600 MG tablet Take 1 tablet (600 mg total) by mouth 3 (three) times daily.  90 tablet  2  . glipiZIDE (GLUCOTROL XL) 2.5 MG 24 hr tablet Take 1 tablet (2.5 mg total) by mouth daily with breakfast.  30 tablet  2  . glucose blood (RELION PRIME TEST) test strip Check blood sugar daily Dx code 250.00  50 each  12  . hydrochlorothiazide (MICROZIDE) 12.5 MG capsule Take 1 capsule (12.5 mg total) by mouth daily.  30 capsule  11  . lisinopril (PRINIVIL,ZESTRIL) 40 MG tablet Take 1 tablet (40 mg total) by mouth daily.  30 tablet  5  . metFORMIN (GLUCOPHAGE) 500 MG tablet 1 pill (500mg ) qAM, 2 pills (500mg ) qPM      . metoprolol (LOPRESSOR) 50 MG tablet Take 1 tablet (50 mg total) by mouth 2 (two) times daily.  60 tablet  11  . pregabalin (LYRICA) 50 MG capsule Take 1 capsule (50 mg total) by mouth 3 (three) times daily.  180 capsule  2   No current facility-administered medications for this visit.  Allergies:    Allergies  Allergen Reactions  . Flexeril [Cyclobenzaprine] Other (See Comments)    Prolonged QTc to 571, tachycardia  . Soma [Carisoprodol] Itching and Rash    Social History:  The patient  reports that she has been smoking Cigarettes.  She has a 20.5 pack-year smoking history. She has never used smokeless tobacco. She reports that she does not drink alcohol or use illicit drugs.   ROS:  Please see the history of present illness.      All other systems reviewed and negative.   OBJECTIVE: VS:  BP 142/88  Pulse 68  Ht 5\' 6"  (1.676 m)  Wt 213 lb 6.4 oz (96.798 kg)  BMI 34.46 kg/m2 Well nourished, well developed, in no acute distress, appears older than stated age 71: normal Neck: JVD flat. Carotid  bruit absent  Cardiac:  normal S1, S2; RRR; no murmur Lungs:  clear to auscultation bilaterally, no wheezing, rhonchi. Faint crackles heard throughout both lung fields  Abd: soft, nontender, no hepatomegaly Ext: Edema  Trace bilateral . Pulses 2+  Skin: warm and dry Neuro:  CNs 2-12 intact, no focal abnormalities noted  EKG:  Not repeated       Signed, Illene Labrador III, MD 08/19/2013 12:16 PM

## 2013-08-19 NOTE — Patient Instructions (Signed)
Your physician recommends that you continue on your current medications as directed. Please refer to the Current Medication list given to you today.  Your physician has recommended that you have a pulmonary function test. Pulmonary Function Tests are a group of tests that measure how well air moves in and out of your lungs.  You have been referred to PCP for a pulmonary work up

## 2013-08-26 ENCOUNTER — Encounter: Payer: BC Managed Care – PPO | Admitting: Internal Medicine

## 2013-08-27 ENCOUNTER — Other Ambulatory Visit: Payer: Self-pay | Admitting: *Deleted

## 2013-08-28 MED ORDER — GLIPIZIDE ER 2.5 MG PO TB24: 2.5000 mg | ORAL_TABLET | Freq: Every day | ORAL | Status: DC

## 2013-08-29 ENCOUNTER — Encounter (HOSPITAL_COMMUNITY): Payer: Self-pay | Admitting: Emergency Medicine

## 2013-08-29 ENCOUNTER — Emergency Department (HOSPITAL_COMMUNITY)
Admission: EM | Admit: 2013-08-29 | Discharge: 2013-08-30 | Disposition: A | Discharge status: Home / Self Care | Payer: BC Managed Care – PPO | Attending: Emergency Medicine | Admitting: Emergency Medicine

## 2013-08-29 DIAGNOSIS — J449 Chronic obstructive pulmonary disease, unspecified: Secondary | ICD-10-CM | POA: Insufficient documentation

## 2013-08-29 DIAGNOSIS — R197 Diarrhea, unspecified: Secondary | ICD-10-CM | POA: Insufficient documentation

## 2013-08-29 DIAGNOSIS — F3289 Other specified depressive episodes: Secondary | ICD-10-CM | POA: Insufficient documentation

## 2013-08-29 DIAGNOSIS — F329 Major depressive disorder, single episode, unspecified: Secondary | ICD-10-CM | POA: Insufficient documentation

## 2013-08-29 DIAGNOSIS — J4489 Other specified chronic obstructive pulmonary disease: Secondary | ICD-10-CM | POA: Insufficient documentation

## 2013-08-29 DIAGNOSIS — F17200 Nicotine dependence, unspecified, uncomplicated: Secondary | ICD-10-CM | POA: Insufficient documentation

## 2013-08-29 DIAGNOSIS — Z8669 Personal history of other diseases of the nervous system and sense organs: Secondary | ICD-10-CM | POA: Insufficient documentation

## 2013-08-29 DIAGNOSIS — G8929 Other chronic pain: Secondary | ICD-10-CM | POA: Insufficient documentation

## 2013-08-29 DIAGNOSIS — D72829 Elevated white blood cell count, unspecified: Secondary | ICD-10-CM | POA: Insufficient documentation

## 2013-08-29 DIAGNOSIS — Z7982 Long term (current) use of aspirin: Secondary | ICD-10-CM | POA: Insufficient documentation

## 2013-08-29 DIAGNOSIS — Z90710 Acquired absence of both cervix and uterus: Secondary | ICD-10-CM | POA: Insufficient documentation

## 2013-08-29 DIAGNOSIS — I509 Heart failure, unspecified: Secondary | ICD-10-CM | POA: Insufficient documentation

## 2013-08-29 DIAGNOSIS — Z9089 Acquired absence of other organs: Secondary | ICD-10-CM | POA: Insufficient documentation

## 2013-08-29 DIAGNOSIS — E119 Type 2 diabetes mellitus without complications: Secondary | ICD-10-CM | POA: Insufficient documentation

## 2013-08-29 DIAGNOSIS — R112 Nausea with vomiting, unspecified: Secondary | ICD-10-CM

## 2013-08-29 DIAGNOSIS — Z87828 Personal history of other (healed) physical injury and trauma: Secondary | ICD-10-CM | POA: Insufficient documentation

## 2013-08-29 DIAGNOSIS — I10 Essential (primary) hypertension: Secondary | ICD-10-CM | POA: Insufficient documentation

## 2013-08-29 DIAGNOSIS — N390 Urinary tract infection, site not specified: Secondary | ICD-10-CM

## 2013-08-29 DIAGNOSIS — Z79899 Other long term (current) drug therapy: Secondary | ICD-10-CM | POA: Insufficient documentation

## 2013-08-29 LAB — URINALYSIS, ROUTINE W REFLEX MICROSCOPIC
Glucose, UA: 100 mg/dL — AB
Ketones, ur: 15 mg/dL — AB
Nitrite: POSITIVE — AB
Protein, ur: 100 mg/dL — AB
Specific Gravity, Urine: 1.027 (ref 1.005–1.030)
Urobilinogen, UA: 1 mg/dL (ref 0.0–1.0)
pH: 5 (ref 5.0–8.0)

## 2013-08-29 LAB — COMPREHENSIVE METABOLIC PANEL
ALT: 50 U/L — ABNORMAL HIGH (ref 0–35)
Albumin: 4.1 g/dL (ref 3.5–5.2)
Alkaline Phosphatase: 73 U/L (ref 39–117)
CO2: 18 mEq/L — ABNORMAL LOW (ref 19–32)
Calcium: 10.3 mg/dL (ref 8.4–10.5)
Chloride: 98 mEq/L (ref 96–112)
GFR calc Af Amer: 86 mL/min — ABNORMAL LOW (ref >90)
GFR calc non Af Amer: 74 mL/min — ABNORMAL LOW (ref >90)
Glucose, Bld: 233 mg/dL — ABNORMAL HIGH (ref 70–99)
Sodium: 137 mEq/L (ref 137–147)
Total Bilirubin: 0.5 mg/dL (ref 0.3–1.2)

## 2013-08-29 LAB — URINE MICROSCOPIC-ADD ON

## 2013-08-29 LAB — CBC WITH DIFFERENTIAL/PLATELET
Basophils Absolute: 0.1 K/uL (ref 0.0–0.1)
Basophils Relative: 1 % (ref 0–1)
Eosinophils Absolute: 0.1 K/uL (ref 0.0–0.7)
Eosinophils Relative: 1 % (ref 0–5)
HCT: 47.7 % — ABNORMAL HIGH (ref 36.0–46.0)
Hemoglobin: 16.8 g/dL — ABNORMAL HIGH (ref 12.0–15.0)
Lymphocytes Relative: 27 % (ref 12–46)
Lymphs Abs: 3 10*3/uL (ref 0.7–4.0)
MCH: 33.1 pg (ref 26.0–34.0)
MCHC: 35.2 g/dL (ref 30.0–36.0)
MCV: 93.9 fL (ref 78.0–100.0)
Monocytes Absolute: 0.9 K/uL (ref 0.1–1.0)
Monocytes Relative: 8 % (ref 3–12)
Neutro Abs: 7 K/uL (ref 1.7–7.7)
Neutrophils Relative %: 64 % (ref 43–77)
Platelets: 248 10*3/uL (ref 150–400)
RBC: 5.08 MIL/uL (ref 3.87–5.11)
RDW: 13.2 % (ref 11.5–15.5)
WBC: 10.9 10*3/uL — ABNORMAL HIGH (ref 4.0–10.5)

## 2013-08-29 LAB — COMPREHENSIVE METABOLIC PANEL WITH GFR
AST: 41 U/L — ABNORMAL HIGH (ref 0–37)
BUN: 18 mg/dL (ref 6–23)
Creatinine, Ser: 0.84 mg/dL (ref 0.50–1.10)
Potassium: 4.6 meq/L (ref 3.7–5.3)
Total Protein: 7.9 g/dL (ref 6.0–8.3)

## 2013-08-29 MED ORDER — ONDANSETRON 4 MG PO TBDP
8.0000 mg | ORAL_TABLET | Freq: Once | ORAL | Status: AC
  Administered 2013-08-29: 8 mg via ORAL
  Filled 2013-08-29: qty 2

## 2013-08-29 MED ORDER — ONDANSETRON HCL 4 MG/2ML IJ SOLN
4.0000 mg | Freq: Once | INTRAMUSCULAR | Status: AC
  Administered 2013-08-29: 4 mg via INTRAVENOUS
  Filled 2013-08-29: qty 2

## 2013-08-29 MED ORDER — SODIUM CHLORIDE 0.9 % IV BOLUS (SEPSIS)
500.0000 mL | Freq: Once | INTRAVENOUS | Status: AC
  Administered 2013-08-29: 500 mL via INTRAVENOUS

## 2013-08-29 MED ORDER — DEXTROSE 5 % IV SOLN
1.0000 g | Freq: Once | INTRAVENOUS | Status: AC
  Administered 2013-08-29: 1 g via INTRAVENOUS
  Filled 2013-08-29: qty 10

## 2013-08-29 MED ORDER — PANTOPRAZOLE SODIUM 40 MG IV SOLR
40.0000 mg | Freq: Once | INTRAVENOUS | Status: AC
  Administered 2013-08-29: 40 mg via INTRAVENOUS
  Filled 2013-08-29: qty 40

## 2013-08-29 NOTE — ED Notes (Signed)
Patient went to bathroom to provide UA sample- without success.

## 2013-08-29 NOTE — ED Notes (Signed)
Pt came to desk and says she is very nauseated. Pt informed of wait; encouraged to stay and be seen. Zofran given.

## 2013-08-29 NOTE — ED Notes (Signed)
Pt states since Monday she has been vomiting everything that she intakes orally.  Pt states this includes water, medications and foods.  Pt also states she is having trouble sleeping, but is unable to get comfortable.

## 2013-08-29 NOTE — ED Provider Notes (Signed)
CSN: [id number]     Arrival date & time 08/29/13  1918 History   First MD Initiated Contact with Patient 08/29/13 2137     Chief Complaint  Patient presents with  . Emesis     (Consider location/radiation/quality/duration/timing/severity/associated sxs/prior Treatment) Patient is a 63 y.o. female presenting with vomiting. The history is provided by the patient. No language interpreter was used.  Emesis Severity:  Severe Duration:  4 days Timing:  Intermittent Quality:  Bilious material Progression:  Worsening Chronicity:  New Recent urination:  Decreased Associated symptoms: abdominal pain, chills and diarrhea   Abdominal pain:    Location:  Generalized   Quality:  Cramping   Severity:  Moderate   Duration:  4 days   Timing:  Intermittent   Progression:  Waxing and waning   Chronicity:  New Diarrhea:    Quality:  Watery   Timing:  Intermittent   Past Medical History  Diagnosis Date  . Hypertension   . Diabetes mellitus without complication     diagnosed around 2010, only ever on metformin  . Chronic pain     neck pain, headache, neuropathy  . Hypertriglyceridemia   . Neuromuscular disorder   . COPD (chronic obstructive pulmonary disease)   . CHF (congestive heart failure)   . Depression   . Puncture wound of foot, right 05/17/2012    Tetanus shot 3 yrs ago at [facility] in [place], per pt report    Past Surgical History  Procedure Laterality Date  . Rotator cuff repair    . Carpal tunnel release    . Abdominal hysterectomy    . Cholecystectomy     Family History  Problem Relation Age of Onset  . Hyperlipidemia Mother   . Heart attack Father 61  . Hypertension Father   . Cancer Paternal Grandfather     Lung cancer  . Cancer Maternal Grandmother   . Heart attack Maternal Grandmother    History  Substance Use Topics  . Smoking status: Current Every Day Smoker -- 0.50 packs/day for 41 years    Types: Cigarettes  . Smokeless tobacco: Never Used   Comment: Trying to cut back.  . Alcohol Use: No   OB History   Grav Para Term Preterm Abortions TAB SAB Ect Mult Living                 Review of Systems  Constitutional: Positive for chills.  Gastrointestinal: Positive for vomiting, abdominal pain and diarrhea.  All other systems reviewed and are negative.      Allergies  Flexeril and Soma  Home Medications   Current Outpatient Rx  Name  Route  Sig  Dispense  Refill  . amLODipine (NORVASC) 5 MG tablet   Oral   Take 1 tablet (5 mg total) by mouth daily.   30 tablet   11   . aspirin 81 MG tablet   Oral   Take 81 mg by mouth daily.         . butalbital-acetaminophen-caffeine (FIORICET) 50-325-40 MG per tablet   Oral   Take 1-2 tablets by mouth at bedtime as needed for headache.   30 tablet   0   . DULoxetine (CYMBALTA) 60 MG capsule   Oral   Take 1 capsule (60 mg total) by mouth daily.   30 capsule   11   . gabapentin (NEURONTIN) 600 MG tablet   Oral   Take 1 tablet (600 mg total) by mouth 3 (three) times daily.  90 tablet   2   . glipiZIDE (GLUCOTROL XL) 2.5 MG 24 hr tablet   Oral   Take 1 tablet (2.5 mg total) by mouth daily with breakfast.   30 tablet   1   . hydrochlorothiazide (MICROZIDE) 12.5 MG capsule   Oral   Take 1 capsule (12.5 mg total) by mouth daily.   30 capsule   11   . lisinopril (PRINIVIL,ZESTRIL) 40 MG tablet   Oral   Take 1 tablet (40 mg total) by mouth daily.   30 tablet   5   . metFORMIN (GLUCOPHAGE) 500 MG tablet   Oral   Take 500 mg by mouth 2 (two) times daily with a meal.         . Blood Glucose Monitoring Suppl (RELION PRIME MONITOR) DEVI   Does not apply   1 each by Does not apply route daily. Dx code 250.00   1 Device   1   . glucose blood (RELION PRIME TEST) test strip      Check blood sugar daily Dx code 250.00   50 each   12   . metoprolol (LOPRESSOR) 50 MG tablet   Oral   Take 1 tablet (50 mg total) by mouth 2 (two) times daily.   60 tablet    11    BP 134/97  Pulse 124  Temp(Src) 97.7 F (36.5 C) (Oral)  Resp 16  Ht 5\' 6"  (1.676 m)  Wt 199 lb (90.266 kg)  BMI 32.13 kg/m2  SpO2 97% Physical Exam  Nursing note and vitals reviewed. Constitutional: She is oriented to person, place, and time. She appears well-developed.  HENT:  Head: Normocephalic.  Eyes: Pupils are equal, round, and reactive to light.  Neck: Normal range of motion.  Cardiovascular: Normal rate and regular rhythm.   Pulmonary/Chest: Effort normal and breath sounds normal.  Abdominal: Soft. Bowel sounds are normal. There is tenderness.  Musculoskeletal: She exhibits no edema and no tenderness.  Neurological: She is alert and oriented to person, place, and time.  Skin: Skin is warm and dry.  Psychiatric: She has a normal mood and affect. Her behavior is normal. Judgment and thought content normal.    ED Course  Procedures (including critical care time) Labs Review Labs Reviewed  CBC WITH DIFFERENTIAL - Abnormal; Notable for the following:    WBC 10.9 (*)    Hemoglobin 16.8 (*)    HCT 47.7 (*)    All other components within normal limits  COMPREHENSIVE METABOLIC PANEL - Abnormal; Notable for the following:    CO2 18 (*)    Glucose, Bld 233 (*)    AST 41 (*)    ALT 50 (*)    GFR calc non Af Amer 74 (*)    GFR calc Af Amer 86 (*)    All other components within normal limits  URINALYSIS, ROUTINE W REFLEX MICROSCOPIC   Imaging Review No results found.   EKG Interpretation None     Patient discussed with Dr. Dorna Mai.   Patient feels better after IV fluids and medication.  Is tolerating po fluids without difficulty at present.  No additional episodes of diarrhea in ED.  Mild leukocytosis, UA consistent with UTI.  Received dose of rocephin for UTI, will continue outpatient treatment with keflex.  Urine culture added to labs.  Will discharge home with zofran odt for nausea/vomiting control. MDM   Final diagnoses:  None    Nausea, vomiting,  diarrhea. UTI.    Shanon Brow  Pia Mau, NP 08/30/13 [phone]

## 2013-08-30 ENCOUNTER — Other Ambulatory Visit: Payer: Self-pay | Admitting: Internal Medicine

## 2013-08-30 MED ORDER — PROMETHAZINE HCL 25 MG/ML IJ SOLN
12.5000 mg | INTRAMUSCULAR | Status: AC
  Administered 2013-08-30: 12.5 mg via INTRAVENOUS
  Filled 2013-08-30: qty 1

## 2013-08-30 MED ORDER — ONDANSETRON 4 MG PO TBDP: 4.0000 mg | ORAL_TABLET | Freq: Three times a day (TID) | ORAL | Status: DC | PRN

## 2013-08-30 MED ORDER — CEPHALEXIN 500 MG PO CAPS: 500.0000 mg | ORAL_CAPSULE | Freq: Four times a day (QID) | ORAL | Status: DC

## 2013-08-30 NOTE — Discharge Instructions (Signed)
Urinary Tract Infection Urinary tract infections (UTIs) can develop anywhere along your urinary tract. Your urinary tract is your body's drainage system for removing wastes and extra water. Your urinary tract includes two kidneys, two ureters, a bladder, and a urethra. Your kidneys are a pair of bean-shaped organs. Each kidney is about the size of your fist. They are located below your ribs, one on each side of your spine. CAUSES Infections are caused by microbes, which are microscopic organisms, including fungi, viruses, and bacteria. These organisms are so small that they can only be seen through a microscope. Bacteria are the microbes that most commonly cause UTIs. SYMPTOMS  Symptoms of UTIs may vary by age and gender of the patient and by the location of the infection. Symptoms in young women typically include a frequent and intense urge to urinate and a painful, burning feeling in the bladder or urethra during urination. Older women and men are more likely to be tired, shaky, and weak and have muscle aches and abdominal pain. A fever may mean the infection is in your kidneys. Other symptoms of a kidney infection include pain in your back or sides below the ribs, nausea, and vomiting. DIAGNOSIS To diagnose a UTI, your caregiver will ask you about your symptoms. Your caregiver also will ask to provide a urine sample. The urine sample will be tested for bacteria and white blood cells. White blood cells are made by your body to help fight infection. TREATMENT  Typically, UTIs can be treated with medication. Because most UTIs are caused by a bacterial infection, they usually can be treated with the use of antibiotics. The choice of antibiotic and length of treatment depend on your symptoms and the type of bacteria causing your infection. HOME CARE INSTRUCTIONS  If you were prescribed antibiotics, take them exactly as your caregiver instructs you. Finish the medication even if you feel better after you  have only taken some of the medication.  Drink enough water and fluids to keep your urine clear or pale yellow.  Avoid caffeine, tea, and carbonated beverages. They tend to irritate your bladder.  Empty your bladder often. Avoid holding urine for long periods of time.  Empty your bladder before and after sexual intercourse.  After a bowel movement, women should cleanse from front to back. Use each tissue only once. SEEK MEDICAL CARE IF:   You have back pain.  You develop a fever.  Your symptoms do not begin to resolve within 3 days. SEEK IMMEDIATE MEDICAL CARE IF:   You have severe back pain or lower abdominal pain.  You develop chills.  You have nausea or vomiting.  You have continued burning or discomfort with urination. MAKE SURE YOU:   Understand these instructions.  Will watch your condition.  Will get help right away if you are not doing well or get worse. Document Released: 03/09/2005 Document Revised: 11/29/2011 Document Reviewed: 07/08/2011 [facility] Patient Information 2014 [place]. Viral Gastroenteritis Viral gastroenteritis is also known as stomach flu. This condition affects the stomach and intestinal tract. It can cause sudden diarrhea and vomiting. The illness typically lasts 3 to 8 days. Most people develop an immune response that eventually gets rid of the virus. While this natural response develops, the virus can make you quite ill. CAUSES  Many different viruses can cause gastroenteritis, such as rotavirus or noroviruses. You can catch one of these viruses by consuming contaminated food or water. You may also catch a virus by sharing utensils or other personal  items with an infected person or by touching a contaminated surface. SYMPTOMS  The most common symptoms are diarrhea and vomiting. These problems can cause a severe loss of body fluids (dehydration) and a body salt (electrolyte) imbalance. Other symptoms may  include:  Fever.  Headache.  Fatigue.  Abdominal pain. DIAGNOSIS  Your caregiver can usually diagnose viral gastroenteritis based on your symptoms and a physical exam. A stool sample may also be taken to test for the presence of viruses or other infections. TREATMENT  This illness typically goes away on its own. Treatments are aimed at rehydration. The most serious cases of viral gastroenteritis involve vomiting so severely that you are not able to keep fluids down. In these cases, fluids must be given through an intravenous line (IV). HOME CARE INSTRUCTIONS   Drink enough fluids to keep your urine clear or pale yellow. Drink small amounts of fluids frequently and increase the amounts as tolerated.  Ask your caregiver for specific rehydration instructions.  Avoid:  Foods high in sugar.  Alcohol.  Carbonated drinks.  Tobacco.  Juice.  Caffeine drinks.  Extremely hot or cold fluids.  Fatty, greasy foods.  Too much intake of anything at one time.  Dairy products until 24 to 48 hours after diarrhea stops.  You may consume probiotics. Probiotics are active cultures of beneficial bacteria. They may lessen the amount and number of diarrheal stools in adults. Probiotics can be found in yogurt with active cultures and in supplements.  Wash your hands well to avoid spreading the virus.  Only take over-the-counter or prescription medicines for pain, discomfort, or fever as directed by your caregiver. Do not give aspirin to children. Antidiarrheal medicines are not recommended.  Ask your caregiver if you should continue to take your regular prescribed and over-the-counter medicines.  Keep all follow-up appointments as directed by your caregiver. SEEK IMMEDIATE MEDICAL CARE IF:   You are unable to keep fluids down.  You do not urinate at least once every 6 to 8 hours.  You develop shortness of breath.  You notice blood in your stool or vomit. This may look like coffee  grounds.  You have abdominal pain that increases or is concentrated in one small area (localized).  You have persistent vomiting or diarrhea.  You have a fever.  The patient is a child younger than 3 months, and he or she has a fever.  The patient is a child older than 3 months, and he or she has a fever and persistent symptoms.  The patient is a child older than 3 months, and he or she has a fever and symptoms suddenly get worse.  The patient is a baby, and he or she has no tears when crying. MAKE SURE YOU:   Understand these instructions.  Will watch your condition.  Will get help right away if you are not doing well or get worse. Document Released: 05/30/2005 Document Revised: 08/22/2011 Document Reviewed: 03/16/2011 [facility] Patient Information 2014 [place].

## 2013-08-30 NOTE — Telephone Encounter (Signed)
Patient needs an appointment

## 2013-08-31 ENCOUNTER — Other Ambulatory Visit: Payer: Self-pay | Admitting: Internal Medicine

## 2013-09-01 LAB — URINE CULTURE
Colony Count: >=100000
SPECIAL REQUESTS: NORMAL

## 2013-09-03 NOTE — Telephone Encounter (Signed)
Message sent to front desk to schedule pt an appt. 

## 2013-09-06 NOTE — ED Provider Notes (Signed)
Medical screening examination/treatment/procedure(s) were performed by non-physician practitioner and as supervising physician I was immediately available for consultation/collaboration.   EKG Interpretation None        Saddie Benders. Dorna Mai, MD 09/06/13 [id number]

## 2013-09-09 ENCOUNTER — Ambulatory Visit (INDEPENDENT_AMBULATORY_CARE_PROVIDER_SITE_OTHER): Payer: BC Managed Care – PPO | Admitting: Internal Medicine

## 2013-09-09 ENCOUNTER — Encounter: Payer: Self-pay | Admitting: Internal Medicine

## 2013-09-09 VITALS — BP 136/77 | HR 76 | Temp 97.6°F | Ht 65.0 in | Wt 210.3 lb

## 2013-09-09 DIAGNOSIS — I10 Essential (primary) hypertension: Secondary | ICD-10-CM

## 2013-09-09 DIAGNOSIS — E1140 Type 2 diabetes mellitus with diabetic neuropathy, unspecified: Secondary | ICD-10-CM

## 2013-09-09 DIAGNOSIS — E1165 Type 2 diabetes mellitus with hyperglycemia: Principal | ICD-10-CM

## 2013-09-09 DIAGNOSIS — IMO0002 Reserved for concepts with insufficient information to code with codable children: Secondary | ICD-10-CM

## 2013-09-09 DIAGNOSIS — R51 Headache: Secondary | ICD-10-CM

## 2013-09-09 DIAGNOSIS — I509 Heart failure, unspecified: Secondary | ICD-10-CM

## 2013-09-09 DIAGNOSIS — I5032 Chronic diastolic (congestive) heart failure: Secondary | ICD-10-CM

## 2013-09-09 DIAGNOSIS — F17200 Nicotine dependence, unspecified, uncomplicated: Secondary | ICD-10-CM

## 2013-09-09 DIAGNOSIS — E1142 Type 2 diabetes mellitus with diabetic polyneuropathy: Secondary | ICD-10-CM

## 2013-09-09 DIAGNOSIS — Z0000 Encounter for general adult medical examination without abnormal findings: Secondary | ICD-10-CM

## 2013-09-09 DIAGNOSIS — E1149 Type 2 diabetes mellitus with other diabetic neurological complication: Secondary | ICD-10-CM

## 2013-09-09 LAB — POCT GLYCOSYLATED HEMOGLOBIN (HGB A1C): Hemoglobin A1C: 8.2

## 2013-09-09 LAB — GLUCOSE, CAPILLARY: GLUCOSE-CAPILLARY: 151 mg/dL — AB (ref 70–99)

## 2013-09-09 MED ORDER — GLUCOSE BLOOD VI STRP: ORAL_STRIP | Status: DC

## 2013-09-09 MED ORDER — BUTALBITAL-APAP-CAFFEINE 50-325-40 MG PO TABS: 1.0000 | ORAL_TABLET | Freq: Every evening | ORAL | Status: DC | PRN

## 2013-09-09 MED ORDER — GLIPIZIDE ER 2.5 MG PO TB24: 5.0000 mg | ORAL_TABLET | Freq: Every day | ORAL | Status: DC

## 2013-09-09 NOTE — Assessment & Plan Note (Addendum)
Lab Results  Component Value Date   HGBA1C 8.2 09/09/2013   HGBA1C 8.3 06/14/2013   HGBA1C 8.1* 03/10/2013     Assessment: Diabetes control:  poor Progress toward A1C goal:    unchanged Comments:   Plan: Medications:  continue current medications Home glucose monitoring: Frequency:   Timing:   Instruction/counseling given: reminded to bring blood glucose meter & log to each visit, reminded to bring medications to each visit and discussed the need for weight loss Educational resources provided:   Self management tools provided:   Other plans:  A1c today is 8.2%, which is still not well controlled. She is on a lower dose of metformin than would be desired 2/2 diarrhea with higher doses. We will plan to increase her glipizide today from 2.5mg  daily to 5mg  daily. She does not check her blood sugar regularly 2/2 no strips at home. I gave her a prescription for strips today based on the meter information she gave me. She agrees to check her blood sugar 2-3 times per day. I also instructed her to be aware of hypoglycemic symptoms as this medication can cause hypoglycemia. Patient notes improvement of her neuropathy symptoms despite the fact that she took herself off of the lyrica. We will keep this medication off and will not plan to add it back on unless she develops worsening of her symptoms.

## 2013-09-09 NOTE — Assessment & Plan Note (Signed)
Patient has had tremendous improvement of her HAs. They are milder when they are present and they come less frequently than they had before. She is also sleeping better because her HAs do not keep her up at night. We will plan to continue the fioricet 1-2 tablets qHS prn. I will give her 30 tablets per month. I gave her 2 prescriptions today. She will fill one today and fill the other one 10/10/2013. I will see her back in 2-3 months.

## 2013-09-09 NOTE — Assessment & Plan Note (Signed)
Continues to smoke just under 1/2 ppd. No interest in quitting at this time.

## 2013-09-09 NOTE — Patient Instructions (Signed)
Thank you for your visit.  Today I gave you 2 prescriptions for fioricet. Please do not refill the second one until next month (10/10/2013).   Please follow up with GI for your colonoscopy appointment.   I increased your glipizide today from 2.5mg  to 5mg  daily. Please be aware that this medication can increase the risk of low blood sugar. I gave you a prescription for test strips. Please start checking your blood sugar at least 3 times per day, especially when you feel like it might be low.  Follow up with me in 2-3 months.

## 2013-09-09 NOTE — Assessment & Plan Note (Signed)
Patient continues to refuse flu shot and zostavax. She was given referral to GI for colonoscopy at her last visit, but she missed this appointment since she was sick. Patient was given another appointment w/ GI for colonoscopy. She was told this appointment time and she plans to go to the appointment.

## 2013-09-09 NOTE — Progress Notes (Signed)
Patient ID: [name], female   DOB: 11-03-1950, 63 y.o.   MRN: [id number] HPI The patient is a 63 y.o. female with a history of TIA, DM w/ neuropathy, dCHF, HTN, chronic HA and depression who presents for follow up appointment for her chronic HA.   During her last visit on 07/22/2013 I started patient on Fioricet to be taken each night as needed before bed to help her sleep. Patient notes that since starting to take the fioricet prn she feels much improved. Her HAs are more well controlled and she is able to sleep better at night. She is less tired during the day and is overall in better spirits. She is quite satisfied with this medication.   With regard to her DM, patient's A1c today is 8.2. She continues on the metformin 500mg  BID and glipizide 2.5mg  daily. She does not check her blood sugar at home because she does not have any test strip. She was supposed to call me since her last visit with which meter she has so I could call in the test strips, but she did not do this. She tells me she thinks she has a one touch glucometer. She has peripheral neuropathy, worst in her b/l feet, that she thinks is much improved recently. She had been taking gabapentin 600mg  TID and lyrica 50mg  TID, but she took herself off of the lyrica a few weeks ago. She is doing well with regard to her neuropathy since she began sleeping with a pillow behind her back at night.   Patient w/ recent hx of DOE and chest tightness with exertion. Patient notes improvement of DOE and no more chest pain over the last 2 weeks. She is unsure of the reason, but thinks it is related to the fact she is placing a pillow behind her back at night. She is followed by cardiology for this and had a low risk myocardial perfusion study a few months ago. Cardiology asked her to get PFTs done and if these are negative they will consider coronary angiography. PFTs are scheduled for 09/19/2013. Patient is a smoker and continues to smoke just under 1/2 ppd.  She is not interested in quitting.  She was treated with keflex for UTI in the ED on 3/9 and reports complete resolution of her symptoms and completion of the course of antibiotics.  ROS: General: no fevers, chills Skin: no rash HEENT: no vision changes, sore throat Pulm: some DOE, but this has improved recently CV: no chest pain, palpitations Abd: no abdominal pain, nausea/vomiting, diarrhea/constipation GU: no dysuria Ext: no leg swelling Neuro: some numbness/tigling to b/l feet/lower legs; no weakness   Filed Vitals:   09/09/13 1326  BP: 136/77  Pulse: 76  Temp: 97.6 F (36.4 C)  SpO2 96% r/a WT 210lbs  Physical Exam General: alert, cooperative, and in no apparent distress; she is is great spirits today HEENT: pupils equal round and reactive to light, vision grossly intact, oropharynx clear and non-erythematous  Neck: supple Lungs: mild scattered wheezing to R base, otherwise clear to auscultation b/l; normal work of respiration Heart: regular rate and rhythm, no murmurs, gallops, or rubs Abdomen: soft, non-tender, normal BS Extremities: trace pitting edema to BLE Neurologic: alert & oriented X3, cranial nerves II-XII grossly intact, strength grossly intact, sensation intact to light touch  Current Outpatient Prescriptions on File Prior to Visit  Medication Sig Dispense Refill  . amLODipine (NORVASC) 5 MG tablet Take 1 tablet (5 mg total) by mouth daily.  30 tablet  11  . aspirin 81 MG tablet Take 81 mg by mouth daily.      . Blood Glucose Monitoring Suppl (RELION PRIME MONITOR) DEVI 1 each by Does not apply route daily. Dx code 250.00  1 Device  1  . butalbital-acetaminophen-caffeine (FIORICET) 50-325-40 MG per tablet Take 1-2 tablets by mouth at bedtime as needed for headache.  30 tablet  0  . cephALEXin (KEFLEX) 500 MG capsule Take 1 capsule (500 mg total) by mouth 4 (four) times daily.  20 capsule  0  . DULoxetine (CYMBALTA) 60 MG capsule Take 1 capsule (60 mg total)  by mouth daily.  30 capsule  11  . gabapentin (NEURONTIN) 600 MG tablet Take 1 tablet (600 mg total) by mouth 3 (three) times daily.  90 tablet  2  . glipiZIDE (GLUCOTROL XL) 2.5 MG 24 hr tablet Take 1 tablet (2.5 mg total) by mouth daily with breakfast.  30 tablet  1  . glucose blood (RELION PRIME TEST) test strip Check blood sugar daily Dx code 250.00  50 each  12  . hydrochlorothiazide (MICROZIDE) 12.5 MG capsule Take 1 capsule (12.5 mg total) by mouth daily.  30 capsule  11  . lisinopril (PRINIVIL,ZESTRIL) 40 MG tablet Take 1 tablet (40 mg total) by mouth daily.  30 tablet  5  . metFORMIN (GLUCOPHAGE) 500 MG tablet Take 500 mg by mouth 2 (two) times daily with a meal.      . metoprolol (LOPRESSOR) 50 MG tablet Take 1 tablet (50 mg total) by mouth 2 (two) times daily.  60 tablet  11  . ondansetron (ZOFRAN-ODT) 4 MG disintegrating tablet Take 1 tablet (4 mg total) by mouth every 8 (eight) hours as needed for nausea.  20 tablet  0   No current facility-administered medications on file prior to visit.    Assessment/Plan

## 2013-09-09 NOTE — Progress Notes (Signed)
Case discussed with Dr. Mechele Claude at the time of the visit.  We reviewed the resident's history and exam and pertinent patient test results.  I agree with the assessment, diagnosis and plan of care documented in the resident's note.

## 2013-09-25 ENCOUNTER — Encounter (HOSPITAL_COMMUNITY): Payer: Self-pay | Admitting: Emergency Medicine

## 2013-09-25 ENCOUNTER — Emergency Department (HOSPITAL_COMMUNITY)
Admission: EM | Admit: 2013-09-25 | Discharge: 2013-09-26 | Disposition: A | Discharge status: Home / Self Care | Payer: BC Managed Care – PPO | Attending: Emergency Medicine | Admitting: Emergency Medicine

## 2013-09-25 ENCOUNTER — Emergency Department (HOSPITAL_COMMUNITY): Payer: BC Managed Care – PPO

## 2013-09-25 DIAGNOSIS — R112 Nausea with vomiting, unspecified: Secondary | ICD-10-CM

## 2013-09-25 DIAGNOSIS — Z7982 Long term (current) use of aspirin: Secondary | ICD-10-CM | POA: Insufficient documentation

## 2013-09-25 DIAGNOSIS — F329 Major depressive disorder, single episode, unspecified: Secondary | ICD-10-CM | POA: Insufficient documentation

## 2013-09-25 DIAGNOSIS — F17200 Nicotine dependence, unspecified, uncomplicated: Secondary | ICD-10-CM | POA: Insufficient documentation

## 2013-09-25 DIAGNOSIS — R000 Tachycardia, unspecified: Secondary | ICD-10-CM | POA: Insufficient documentation

## 2013-09-25 DIAGNOSIS — Z79899 Other long term (current) drug therapy: Secondary | ICD-10-CM | POA: Insufficient documentation

## 2013-09-25 DIAGNOSIS — G8929 Other chronic pain: Secondary | ICD-10-CM | POA: Insufficient documentation

## 2013-09-25 DIAGNOSIS — J441 Chronic obstructive pulmonary disease with (acute) exacerbation: Secondary | ICD-10-CM | POA: Insufficient documentation

## 2013-09-25 DIAGNOSIS — R197 Diarrhea, unspecified: Secondary | ICD-10-CM | POA: Insufficient documentation

## 2013-09-25 DIAGNOSIS — E119 Type 2 diabetes mellitus without complications: Secondary | ICD-10-CM | POA: Insufficient documentation

## 2013-09-25 DIAGNOSIS — R1013 Epigastric pain: Secondary | ICD-10-CM | POA: Insufficient documentation

## 2013-09-25 DIAGNOSIS — I509 Heart failure, unspecified: Secondary | ICD-10-CM | POA: Insufficient documentation

## 2013-09-25 DIAGNOSIS — Z87828 Personal history of other (healed) physical injury and trauma: Secondary | ICD-10-CM | POA: Insufficient documentation

## 2013-09-25 DIAGNOSIS — F3289 Other specified depressive episodes: Secondary | ICD-10-CM | POA: Insufficient documentation

## 2013-09-25 DIAGNOSIS — E781 Pure hyperglyceridemia: Secondary | ICD-10-CM | POA: Insufficient documentation

## 2013-09-25 DIAGNOSIS — I10 Essential (primary) hypertension: Secondary | ICD-10-CM | POA: Insufficient documentation

## 2013-09-25 LAB — CBC
HCT: 44.5 % (ref 36.0–46.0)
Hemoglobin: 15.3 g/dL — ABNORMAL HIGH (ref 12.0–15.0)
MCH: 32 pg (ref 26.0–34.0)
MCHC: 34.4 g/dL (ref 30.0–36.0)
MCV: 93.1 fL (ref 78.0–100.0)
Platelets: 187 10*3/uL (ref 150–400)
RBC: 4.78 MIL/uL (ref 3.87–5.11)
RDW: 13 % (ref 11.5–15.5)
WBC: 6.1 10*3/uL (ref 4.0–10.5)

## 2013-09-25 LAB — BASIC METABOLIC PANEL
BUN: 19 mg/dL (ref 6–23)
CO2: 16 mEq/L — ABNORMAL LOW (ref 19–32)
Calcium: 9 mg/dL (ref 8.4–10.5)
Chloride: 99 mEq/L (ref 96–112)
Creatinine, Ser: 0.75 mg/dL (ref 0.50–1.10)
GFR calc Af Amer: >90 mL/min (ref >90)
GFR calc non Af Amer: >90 mL/min (ref >90)
Glucose, Bld: 212 mg/dL — ABNORMAL HIGH (ref 70–99)
Potassium: 3.5 mEq/L — ABNORMAL LOW (ref 3.7–5.3)
Sodium: 135 mEq/L — ABNORMAL LOW (ref 137–147)

## 2013-09-25 LAB — I-STAT TROPONIN, ED: Troponin i, poc: 0.04 ng/mL (ref 0.00–0.08)

## 2013-09-25 MED ORDER — ONDANSETRON HCL 4 MG/2ML IJ SOLN
4.0000 mg | Freq: Once | INTRAMUSCULAR | Status: AC
  Administered 2013-09-25: 4 mg via INTRAVENOUS
  Filled 2013-09-25: qty 2

## 2013-09-25 MED ORDER — ALUM & MAG HYDROXIDE-SIMETH 200-200-20 MG/5ML PO SUSP
15.0000 mL | Freq: Once | ORAL | Status: AC
  Administered 2013-09-25: 15 mL via ORAL
  Filled 2013-09-25: qty 30

## 2013-09-25 MED ORDER — IPRATROPIUM-ALBUTEROL 0.5-2.5 (3) MG/3ML IN SOLN
3.0000 mL | Freq: Once | RESPIRATORY_TRACT | Status: AC
  Administered 2013-09-25: 3 mL via RESPIRATORY_TRACT
  Filled 2013-09-25: qty 3

## 2013-09-25 NOTE — ED Notes (Signed)
EMS- pt from home c/o cp since Monday.  sts the she has had some difficulty breathing, nausea, back pain.  CBG 224, sts cp mainly on inspiration.

## 2013-09-26 MED ORDER — ALBUTEROL SULFATE HFA 108 (90 BASE) MCG/ACT IN AERS
1.0000 | INHALATION_SPRAY | Freq: Four times a day (QID) | RESPIRATORY_TRACT | Status: DC | PRN
Start: 2013-09-26 — End: 2014-01-24

## 2013-09-26 MED ORDER — ONDANSETRON HCL 4 MG PO TABS: 4.0000 mg | ORAL_TABLET | Freq: Four times a day (QID) | ORAL | Status: DC

## 2013-09-26 NOTE — ED Notes (Signed)
Pt dc to home. Pt sts understanding to dc instructions. Pt questioning to md regarding not checking urine sample.  md informed pt that a urine sample wasn't needed.

## 2013-09-26 NOTE — Discharge Instructions (Signed)

## 2013-09-26 NOTE — ED Provider Notes (Signed)
CSN: [id number]     Arrival date & time 09/25/13  2210 History   First MD Initiated Contact with Patient 09/25/13 2229     Chief Complaint  Patient presents with  . Chest Pain     (Consider location/radiation/quality/duration/timing/severity/associated sxs/prior Treatment) HPI  This is a 63 year old female with a past medical history of hypertension, diabetes, COPD, CHF, presenting today with nausea, vomiting. Onset 3 days ago. This started at home. Patient vomits about 5-6 times a day. She has the same amount of diarrhea per day. Nonbloody, nonbilious vomitus. Non-bloody non-melanosis bowel movement. No meds taken for this. She has associated epigastric tenderness with the nausea, vomiting and diarrhea. She also complains of shortness of breath. Negative for back pain, diaphoresis, dizziness, or lightheadedness.  Past Medical History  Diagnosis Date  . Hypertension   . Diabetes mellitus without complication     diagnosed around 2010, only ever on metformin  . Chronic pain     neck pain, headache, neuropathy  . Hypertriglyceridemia   . Neuromuscular disorder   . COPD (chronic obstructive pulmonary disease)   . CHF (congestive heart failure)   . Depression   . Puncture wound of foot, right 05/17/2012    Tetanus shot 3 yrs ago at [facility] in [place], per pt report    Past Surgical History  Procedure Laterality Date  . Rotator cuff repair    . Carpal tunnel release    . Abdominal hysterectomy    . Cholecystectomy     Family History  Problem Relation Age of Onset  . Hyperlipidemia Mother   . Heart attack Father 31  . Hypertension Father   . Cancer Paternal Grandfather     Lung cancer  . Cancer Maternal Grandmother   . Heart attack Maternal Grandmother    History  Substance Use Topics  . Smoking status: Current Every Day Smoker -- 0.50 packs/day for 41 years    Types: Cigarettes  . Smokeless tobacco: Never Used     Comment: Trying to cut back.  . Alcohol Use: No    OB History   Grav Para Term Preterm Abortions TAB SAB Ect Mult Living                 Review of Systems  Constitutional: Negative for fever and chills.  HENT: Negative for facial swelling.   Eyes: Negative for photophobia and pain.  Respiratory: Positive for shortness of breath.   Cardiovascular: Negative for leg swelling.  Gastrointestinal: Positive for nausea, vomiting, abdominal pain and diarrhea.  Genitourinary: Negative for dysuria.  Musculoskeletal: Negative for arthralgias.  Skin: Negative for rash and wound.  Neurological: Negative for seizures.  Hematological: Negative for adenopathy.      Allergies  Flexeril and Soma  Home Medications   Prior to Admission medications   Medication Sig Start Date End Date Taking? Authorizing Provider  amLODipine (NORVASC) 5 MG tablet Take 5 mg by mouth daily.   Yes Historical Provider, MD  aspirin 81 MG tablet Take 81 mg by mouth daily.   Yes Historical Provider, MD  DULoxetine (CYMBALTA) 60 MG capsule Take 60 mg by mouth daily.   Yes Historical Provider, MD  gabapentin (NEURONTIN) 600 MG tablet Take 600 mg by mouth 3 (three) times daily.   Yes Historical Provider, MD  glipiZIDE (GLUCOTROL XL) 5 MG 24 hr tablet Take 5 mg by mouth daily with breakfast.   Yes Historical Provider, MD  hydrochlorothiazide (MICROZIDE) 12.5 MG capsule Take 12.5 mg by mouth daily.  Yes Historical Provider, MD  lisinopril (PRINIVIL,ZESTRIL) 40 MG tablet Take 40 mg by mouth daily.   Yes Historical Provider, MD  metFORMIN (GLUCOPHAGE) 500 MG tablet Take 500 mg by mouth 3 (three) times daily.   Yes Historical Provider, MD  metoprolol (LOPRESSOR) 50 MG tablet Take 50 mg by mouth 2 (two) times daily.   Yes Historical Provider, MD  ondansetron (ZOFRAN-ODT) 4 MG disintegrating tablet Take 4 mg by mouth every 8 (eight) hours as needed for nausea or vomiting.   Yes Historical Provider, MD  albuterol (PROVENTIL HFA;VENTOLIN HFA) 108 (90 BASE) MCG/ACT inhaler Inhale  1-2 puffs into the lungs every 6 (six) hours as needed for wheezing or shortness of breath. 09/26/13   Doy Hutching, MD  Blood Glucose Monitoring Suppl (RELION PRIME MONITOR) DEVI 1 each by Does not apply route daily. Dx code 250.00 03/19/13   Jessee Avers, MD  glucose blood (RELION PRIME TEST) test strip Check blood sugar daily Dx code 250.00 03/19/13   Jessee Avers, MD  glucose blood test strip Use as instructed 09/09/13   Rebecca Eaton, MD  ondansetron (ZOFRAN) 4 MG tablet Take 1 tablet (4 mg total) by mouth every 6 (six) hours. 09/26/13   Doy Hutching, MD   BP 135/89  Pulse 85  Temp(Src) 98 F (36.7 C) (Oral)  Resp 29  SpO2 95% Physical Exam  Constitutional: She is oriented to person, place, and time. She appears well-developed and well-nourished. No distress.  HENT:  Head: Normocephalic and atraumatic.  Mouth/Throat: No oropharyngeal exudate.  Eyes: Conjunctivae are normal. Pupils are equal, round, and reactive to light. No scleral icterus.  Neck: Normal range of motion. No tracheal deviation present. No thyromegaly present.  Cardiovascular: Regular rhythm and normal heart sounds.  Exam reveals no gallop and no friction rub.   No murmur heard. Tachycardic  Pulmonary/Chest: Effort normal. No stridor. No respiratory distress. She has wheezes. She has no rales. She exhibits no tenderness.  Abdominal: Soft. She exhibits no distension and no mass. There is tenderness (epigastric). There is no rebound and no guarding.  Musculoskeletal: Normal range of motion. She exhibits no edema.  Neurological: She is alert and oriented to person, place, and time.  Skin: Skin is warm and dry. She is not diaphoretic.    ED Course  Procedures (including critical care time) Labs Review Labs Reviewed  CBC - Abnormal; Notable for the following:    Hemoglobin 15.3 (*)    All other components within normal limits  BASIC METABOLIC PANEL - Abnormal; Notable for the following:    Sodium 135 (*)     Potassium 3.5 (*)    CO2 16 (*)    Glucose, Bld 212 (*)    All other components within normal limits  LIPASE, BLOOD  I-STAT TROPOININ, ED    Imaging Review Dg Chest Port 1 View  09/25/2013   CLINICAL DATA:  Chest pain  EXAM: PORTABLE CHEST - 1 VIEW  COMPARISON:  DG CHEST 2 VIEW dated 03/10/2013  FINDINGS: The heart size and mediastinal contours are within normal limits. Both lungs are clear. The visualized skeletal structures are unremarkable.  IMPRESSION: No active disease.   Electronically Signed   By: Kathreen Devoid   On: 09/25/2013 23:01     EKG Interpretation None      MDM   Final diagnoses:  Nausea & vomiting    This is a 63 year old female with a past medical history of hypertension, diabetes, COPD, CHF, presenting today with nausea, vomiting.  Onset 3 days ago. This started at home. Patient vomits about 5-6 times a day. She has the same amount of diarrhea per day. Nonbloody, nonbilious vomitus. Non-bloody non-melanosis bowel movement. No meds taken for this. She has associated epigastric tenderness with the nausea, vomiting and diarrhea. She also complains of shortness of breath. Negative for back pain, diaphoresis, dizziness, or lightheadedness.  Exam as above, with tachycardia, stable blood pressure, normal temperature, saturating within normal limits on room air. Heart sounds within normal limits. Pulmonary exam is positive for wheezing. Otherwise, she is breathing at a normal rate, she is without retractions. The patient has exquisite tenderness to palpation in the epigastric area. She is negative for rebound, rigidity, guarding.  She had a negative stress test 2 months ago. She has no changes in her EKG from last EKG. Her troponin is normal.  The remainder of her labs are not concerning. Her chest x-ray is without active disease.  Of note, chart review reveals persistent tachycardia during most visits.  I believe her current HR is baseline for pt.    I do not believe that  this discomfort represents ACS, PE, PTX, PNA, pericarditis, tamponade, dissection, esophageal pathology.  I also do not believe that she is suffering from perforation, ischemia, prescription, or emergent inflammation. I believe that she is likely suffering from a gastroenteritis. She also has wheezing which has resolved after DuoNeb. I believe that this represents an exacerbation of her COPD. I am discharging her with instructions to followup with her PCP, albuterol inhaler, Zofran. She is in stable condition. All questions have been answered. Return precautions have been given.          I have discussed case and care has been guided by my attending physician, Dr. Wilson Singer.       Doy Hutching, MD 09/26/13 [phone]

## 2013-09-26 NOTE — ED Notes (Signed)
Pt sts that she needs to pee. Bedside commode at bedside. Pt sts that she cannot pee at this time.

## 2013-09-28 ENCOUNTER — Observation Stay (HOSPITAL_COMMUNITY)
Admission: EM | Admit: 2013-09-28 | Discharge: 2013-09-30 | Disposition: A | Discharge status: Home / Self Care | Payer: BC Managed Care – PPO | Attending: Internal Medicine | Admitting: Internal Medicine

## 2013-09-28 ENCOUNTER — Emergency Department (HOSPITAL_COMMUNITY): Payer: BC Managed Care – PPO

## 2013-09-28 ENCOUNTER — Encounter (HOSPITAL_COMMUNITY): Payer: Self-pay | Admitting: Emergency Medicine

## 2013-09-28 ENCOUNTER — Telehealth: Payer: Self-pay | Admitting: Internal Medicine

## 2013-09-28 ENCOUNTER — Other Ambulatory Visit (HOSPITAL_COMMUNITY): Payer: BC Managed Care – PPO

## 2013-09-28 ENCOUNTER — Inpatient Hospital Stay (HOSPITAL_COMMUNITY): Payer: BC Managed Care – PPO

## 2013-09-28 DIAGNOSIS — R0989 Other specified symptoms and signs involving the circulatory and respiratory systems: Secondary | ICD-10-CM | POA: Diagnosis not present

## 2013-09-28 DIAGNOSIS — Z8673 Personal history of transient ischemic attack (TIA), and cerebral infarction without residual deficits: Secondary | ICD-10-CM | POA: Insufficient documentation

## 2013-09-28 DIAGNOSIS — M6281 Muscle weakness (generalized): Secondary | ICD-10-CM | POA: Insufficient documentation

## 2013-09-28 DIAGNOSIS — I959 Hypotension, unspecified: Secondary | ICD-10-CM | POA: Diagnosis present

## 2013-09-28 DIAGNOSIS — I5030 Unspecified diastolic (congestive) heart failure: Secondary | ICD-10-CM

## 2013-09-28 DIAGNOSIS — F329 Major depressive disorder, single episode, unspecified: Secondary | ICD-10-CM

## 2013-09-28 DIAGNOSIS — E119 Type 2 diabetes mellitus without complications: Secondary | ICD-10-CM

## 2013-09-28 DIAGNOSIS — R42 Dizziness and giddiness: Secondary | ICD-10-CM | POA: Diagnosis not present

## 2013-09-28 DIAGNOSIS — R269 Unspecified abnormalities of gait and mobility: Secondary | ICD-10-CM | POA: Diagnosis not present

## 2013-09-28 DIAGNOSIS — N179 Acute kidney failure, unspecified: Secondary | ICD-10-CM | POA: Diagnosis not present

## 2013-09-28 DIAGNOSIS — E1165 Type 2 diabetes mellitus with hyperglycemia: Secondary | ICD-10-CM

## 2013-09-28 DIAGNOSIS — IMO0002 Reserved for concepts with insufficient information to code with codable children: Secondary | ICD-10-CM

## 2013-09-28 DIAGNOSIS — J449 Chronic obstructive pulmonary disease, unspecified: Secondary | ICD-10-CM | POA: Diagnosis not present

## 2013-09-28 DIAGNOSIS — E869 Volume depletion, unspecified: Secondary | ICD-10-CM | POA: Insufficient documentation

## 2013-09-28 DIAGNOSIS — E781 Pure hyperglyceridemia: Secondary | ICD-10-CM | POA: Insufficient documentation

## 2013-09-28 DIAGNOSIS — E118 Type 2 diabetes mellitus with unspecified complications: Secondary | ICD-10-CM | POA: Diagnosis present

## 2013-09-28 DIAGNOSIS — R29898 Other symptoms and signs involving the musculoskeletal system: Secondary | ICD-10-CM

## 2013-09-28 DIAGNOSIS — R197 Diarrhea, unspecified: Secondary | ICD-10-CM | POA: Insufficient documentation

## 2013-09-28 DIAGNOSIS — F3289 Other specified depressive episodes: Secondary | ICD-10-CM | POA: Insufficient documentation

## 2013-09-28 DIAGNOSIS — E1149 Type 2 diabetes mellitus with other diabetic neurological complication: Secondary | ICD-10-CM | POA: Insufficient documentation

## 2013-09-28 DIAGNOSIS — E1142 Type 2 diabetes mellitus with diabetic polyneuropathy: Secondary | ICD-10-CM | POA: Insufficient documentation

## 2013-09-28 DIAGNOSIS — F17200 Nicotine dependence, unspecified, uncomplicated: Secondary | ICD-10-CM

## 2013-09-28 DIAGNOSIS — R0609 Other forms of dyspnea: Secondary | ICD-10-CM | POA: Diagnosis not present

## 2013-09-28 DIAGNOSIS — J4489 Other specified chronic obstructive pulmonary disease: Secondary | ICD-10-CM

## 2013-09-28 DIAGNOSIS — R112 Nausea with vomiting, unspecified: Secondary | ICD-10-CM | POA: Insufficient documentation

## 2013-09-28 DIAGNOSIS — Z7982 Long term (current) use of aspirin: Secondary | ICD-10-CM | POA: Insufficient documentation

## 2013-09-28 DIAGNOSIS — I10 Essential (primary) hypertension: Secondary | ICD-10-CM

## 2013-09-28 DIAGNOSIS — R0902 Hypoxemia: Secondary | ICD-10-CM

## 2013-09-28 DIAGNOSIS — E1140 Type 2 diabetes mellitus with diabetic neuropathy, unspecified: Secondary | ICD-10-CM

## 2013-09-28 DIAGNOSIS — R0600 Dyspnea, unspecified: Secondary | ICD-10-CM

## 2013-09-28 DIAGNOSIS — I509 Heart failure, unspecified: Secondary | ICD-10-CM | POA: Diagnosis not present

## 2013-09-28 DIAGNOSIS — R51 Headache: Secondary | ICD-10-CM | POA: Diagnosis not present

## 2013-09-28 LAB — CBC WITH DIFFERENTIAL/PLATELET
Basophils Absolute: 0 10*3/uL (ref 0.0–0.1)
Basophils Relative: 1 % (ref 0–1)
Eosinophils Absolute: 0.2 10*3/uL (ref 0.0–0.7)
Eosinophils Relative: 3 % (ref 0–5)
HCT: 37.8 % (ref 36.0–46.0)
HEMOGLOBIN: 12.5 g/dL (ref 12.0–15.0)
LYMPHS ABS: 2.3 10*3/uL (ref 0.7–4.0)
LYMPHS PCT: 31 % (ref 12–46)
MCH: 31.3 pg (ref 26.0–34.0)
MCHC: 33.1 g/dL (ref 30.0–36.0)
MCV: 94.7 fL (ref 78.0–100.0)
MONOS PCT: 10 % (ref 3–12)
Monocytes Absolute: 0.7 10*3/uL (ref 0.1–1.0)
NEUTROS ABS: 4.1 10*3/uL (ref 1.7–7.7)
Neutrophils Relative %: 56 % (ref 43–77)
PLATELETS: 185 10*3/uL (ref 150–400)
RBC: 3.99 MIL/uL (ref 3.87–5.11)
RDW: 13 % (ref 11.5–15.5)
WBC: 7.4 10*3/uL (ref 4.0–10.5)

## 2013-09-28 LAB — COMPREHENSIVE METABOLIC PANEL
ALK PHOS: 50 U/L (ref 39–117)
ALT: 29 U/L (ref 0–35)
AST: 22 U/L (ref 0–37)
Albumin: 2.8 g/dL — ABNORMAL LOW (ref 3.5–5.2)
BUN: 34 mg/dL — ABNORMAL HIGH (ref 6–23)
CHLORIDE: 100 meq/L (ref 96–112)
CO2: 17 mEq/L — ABNORMAL LOW (ref 19–32)
Calcium: 8.7 mg/dL (ref 8.4–10.5)
Creatinine, Ser: 1.56 mg/dL — ABNORMAL HIGH (ref 0.50–1.10)
GFR calc non Af Amer: 35 mL/min — ABNORMAL LOW (ref >90)
GFR, EST AFRICAN AMERICAN: 41 mL/min — AB (ref >90)
GLUCOSE: 92 mg/dL (ref 70–99)
POTASSIUM: 3.5 meq/L — AB (ref 3.7–5.3)
SODIUM: 135 meq/L — AB (ref 137–147)
TOTAL PROTEIN: 5.9 g/dL — AB (ref 6.0–8.3)

## 2013-09-28 LAB — CBG MONITORING, ED: Glucose-Capillary: 90 mg/dL (ref 70–99)

## 2013-09-28 LAB — URINALYSIS, ROUTINE W REFLEX MICROSCOPIC
GLUCOSE, UA: 250 mg/dL — AB
Hgb urine dipstick: NEGATIVE
KETONES UR: NEGATIVE mg/dL
Leukocytes, UA: NEGATIVE
Nitrite: NEGATIVE
PH: 5 (ref 5.0–8.0)
Protein, ur: 30 mg/dL — AB
Specific Gravity, Urine: 1.036 — ABNORMAL HIGH (ref 1.005–1.030)
Urobilinogen, UA: 0.2 mg/dL (ref 0.0–1.0)

## 2013-09-28 LAB — PROTIME-INR
INR: 0.95 (ref 0.00–1.49)
Prothrombin Time: 12.5 seconds (ref 11.6–15.2)

## 2013-09-28 LAB — TROPONIN I: Troponin I: <0.3 ng/mL (ref <0.30)

## 2013-09-28 LAB — URINE MICROSCOPIC-ADD ON

## 2013-09-28 LAB — LACTIC ACID, PLASMA: Lactic Acid, Venous: 1.9 mmol/L (ref 0.5–2.2)

## 2013-09-28 LAB — MRSA PCR SCREENING: MRSA by PCR: NEGATIVE

## 2013-09-28 LAB — PRO B NATRIURETIC PEPTIDE: PRO B NATRI PEPTIDE: 174.2 pg/mL — AB (ref 0–125)

## 2013-09-28 MED ORDER — IOHEXOL 350 MG/ML SOLN
100.0000 mL | Freq: Once | INTRAVENOUS | Status: AC | PRN
  Administered 2013-09-28: 100 mL via INTRAVENOUS

## 2013-09-28 MED ORDER — IPRATROPIUM-ALBUTEROL 0.5-2.5 (3) MG/3ML IN SOLN
3.0000 mL | Freq: Four times a day (QID) | RESPIRATORY_TRACT | Status: DC | PRN
  Administered 2013-09-30: 3 mL via RESPIRATORY_TRACT
  Filled 2013-09-28: qty 3

## 2013-09-28 MED ORDER — SODIUM CHLORIDE 0.9 % IV BOLUS (SEPSIS)
1000.0000 mL | Freq: Once | INTRAVENOUS | Status: AC
  Administered 2013-09-28: 1000 mL via INTRAVENOUS

## 2013-09-28 MED ORDER — DULOXETINE HCL 60 MG PO CPEP
60.0000 mg | ORAL_CAPSULE | Freq: Every day | ORAL | Status: DC
  Administered 2013-09-28 – 2013-09-30 (×3): 60 mg via ORAL
  Filled 2013-09-28 (×4): qty 1

## 2013-09-28 MED ORDER — METHYLPREDNISOLONE SODIUM SUCC 125 MG IJ SOLR
60.0000 mg | Freq: Once | INTRAMUSCULAR | Status: AC
  Administered 2013-09-28: 60 mg via INTRAVENOUS
  Filled 2013-09-28: qty 0.96

## 2013-09-28 MED ORDER — ONDANSETRON HCL 4 MG/2ML IJ SOLN: 4.0000 mg | Freq: Four times a day (QID) | INTRAMUSCULAR | Status: DC | PRN

## 2013-09-28 MED ORDER — INSULIN ASPART 100 UNIT/ML ~~LOC~~ SOLN
0.0000 [IU] | SUBCUTANEOUS | Status: DC
  Administered 2013-09-29: 11 [IU] via SUBCUTANEOUS
  Administered 2013-09-29: 06:00:00 via SUBCUTANEOUS
  Administered 2013-09-29: 5 [IU] via SUBCUTANEOUS
  Administered 2013-09-29 (×2): 8 [IU] via SUBCUTANEOUS
  Administered 2013-09-30: 2 [IU] via SUBCUTANEOUS
  Administered 2013-09-30 (×2): 3 [IU] via SUBCUTANEOUS
  Administered 2013-09-30: 2 [IU] via SUBCUTANEOUS

## 2013-09-28 MED ORDER — IPRATROPIUM-ALBUTEROL 0.5-2.5 (3) MG/3ML IN SOLN
3.0000 mL | Freq: Two times a day (BID) | RESPIRATORY_TRACT | Status: DC
  Administered 2013-09-29 (×2): 3 mL via RESPIRATORY_TRACT
  Filled 2013-09-28 (×4): qty 3

## 2013-09-28 MED ORDER — POTASSIUM CHLORIDE 10 MEQ/100ML IV SOLN
10.0000 meq | INTRAVENOUS | Status: AC
  Administered 2013-09-28 – 2013-09-29 (×3): 10 meq via INTRAVENOUS
  Filled 2013-09-28 (×2): qty 100

## 2013-09-28 MED ORDER — SODIUM CHLORIDE 0.9 % IJ SOLN
3.0000 mL | Freq: Two times a day (BID) | INTRAMUSCULAR | Status: DC
  Administered 2013-09-28 – 2013-09-30 (×3): 3 mL via INTRAVENOUS

## 2013-09-28 MED ORDER — ONDANSETRON HCL 4 MG PO TABS: 4.0000 mg | ORAL_TABLET | Freq: Four times a day (QID) | ORAL | Status: DC | PRN

## 2013-09-28 MED ORDER — IPRATROPIUM-ALBUTEROL 0.5-2.5 (3) MG/3ML IN SOLN
3.0000 mL | RESPIRATORY_TRACT | Status: DC
  Administered 2013-09-28: 3 mL via RESPIRATORY_TRACT
  Filled 2013-09-28: qty 3

## 2013-09-28 MED ORDER — HYDROCODONE-ACETAMINOPHEN 5-325 MG PO TABS
1.0000 | ORAL_TABLET | ORAL | Status: DC | PRN
  Administered 2013-09-29: 1 via ORAL
  Filled 2013-09-28: qty 1

## 2013-09-28 MED ORDER — POTASSIUM CHLORIDE CRYS ER 20 MEQ PO TBCR
40.0000 meq | EXTENDED_RELEASE_TABLET | Freq: Once | ORAL | Status: DC
  Filled 2013-09-28: qty 2

## 2013-09-28 MED ORDER — SODIUM CHLORIDE 0.9 % IV SOLN
INTRAVENOUS | Status: DC
  Administered 2013-09-28: 22:00:00 via INTRAVENOUS

## 2013-09-28 MED ORDER — ASPIRIN 81 MG PO TABS
81.0000 mg | ORAL_TABLET | Freq: Every day | ORAL | Status: DC
Start: 2013-09-28 — End: 2013-09-28

## 2013-09-28 MED ORDER — ASPIRIN 81 MG PO CHEW
81.0000 mg | CHEWABLE_TABLET | Freq: Every day | ORAL | Status: DC
  Administered 2013-09-28 – 2013-09-30 (×3): 81 mg via ORAL
  Filled 2013-09-28 (×4): qty 1

## 2013-09-28 MED ORDER — HEPARIN SODIUM (PORCINE) 5000 UNIT/ML IJ SOLN
5000.0000 [IU] | Freq: Three times a day (TID) | INTRAMUSCULAR | Status: DC
  Administered 2013-09-28 – 2013-09-30 (×6): 5000 [IU] via SUBCUTANEOUS
  Filled 2013-09-28 (×8): qty 1

## 2013-09-28 MED ORDER — SODIUM CHLORIDE 0.9 % IV BOLUS (SEPSIS)
1000.0000 mL | INTRAVENOUS | Status: AC
  Administered 2013-09-28: 1000 mL via INTRAVENOUS

## 2013-09-28 NOTE — Telephone Encounter (Signed)
   Reason for call:   I received a call from [name] at 1:00  PM indicating she was seen in the ED Wed pm for N/V.   Pertinent Data:   She states that at that time she was told that she was wheezing. A neb tx was started (in addition to Zofran and Maalox). A prescription for ProAir was provided. While picking up the prescription today, she states that she broke out in a full sweat and felt very weak. She returned home and used the ProAir and felt very dizzy. The dizziness persisted into today, and she states that she she has been so dizzy that she is running into trees in her yard. She denies any chest pain.    Assessment / Plan / Recommendations:  I told her that these symptoms are very concerning and that she could have had an MI or be having a stroke, and that she needs to be evaluated by a medical professional ASAP. She was advised to go to immediately to the ED for further evaluation. She was notified to call EMS or have someone other than herself transport her to the ED.   Addendum: I called her back to check on her and she had called EMS which was on their way.    Otho Bellows, MD   09/28/2013, 1:08 PM

## 2013-09-28 NOTE — ED Notes (Addendum)
Pt alert and oriented x4. Remains hypotensive on cardiac monitor. Denies pain at the time. Unable to lift legs off bed at the time, states she feels very weak. Speech clear.
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
## 2013-09-28 NOTE — ED Notes (Signed)
Neurology at bedside to consult. 

## 2013-09-28 NOTE — ED Notes (Signed)
Pt reporting that symptoms started at 0915 when walking the dogs.

## 2013-09-28 NOTE — ED Notes (Signed)
Pt here from home by ems for hypotension, pt seen here this week for n/v/d and sent home on zofran with no furthur symtpoms, today felt dizzy and unable to walk and with ems bp was in 71'H systolic, pt had syncopal episode with ems but since has been alert and oriented.

## 2013-09-28 NOTE — ED Notes (Signed)
Pt transported to MRI. Vital signs stable.

## 2013-09-28 NOTE — ED Notes (Signed)
With MD pt unable to lift legs but able to move other extremities and neurologically intact.

## 2013-09-28 NOTE — ED Provider Notes (Signed)
CSN: [id number]     Arrival date & time 09/28/13  59 History   First MD Initiated Contact with Patient 09/28/13 1410     Chief Complaint  Patient presents with  . Hypotension     (Consider location/radiation/quality/duration/timing/severity/associated sxs/prior Treatment) Patient is a 63 y.o. female presenting with near-syncope. The history is provided by the patient.  Near Syncope This is a new problem. The current episode started 3 to 5 hours ago. Episode frequency: intermittently. The problem has not changed since onset.Pertinent negatives include no chest pain, no abdominal pain, no headaches and no shortness of breath. Exacerbated by: standing. Nothing relieves the symptoms. She has tried nothing for the symptoms. The treatment provided no relief.    Past Medical History  Diagnosis Date  . Hypertension   . Diabetes mellitus without complication     diagnosed around 2010, only ever on metformin  . Chronic pain     neck pain, headache, neuropathy  . Hypertriglyceridemia   . Neuromuscular disorder   . COPD (chronic obstructive pulmonary disease)   . CHF (congestive heart failure)   . Depression   . Puncture wound of foot, right 05/17/2012    Tetanus shot 3 yrs ago at [facility] in [place], per pt report    Past Surgical History  Procedure Laterality Date  . Rotator cuff repair    . Carpal tunnel release    . Abdominal hysterectomy    . Cholecystectomy     Family History  Problem Relation Age of Onset  . Hyperlipidemia Mother   . Heart attack Father 51  . Hypertension Father   . Cancer Paternal Grandfather     Lung cancer  . Cancer Maternal Grandmother   . Heart attack Maternal Grandmother    History  Substance Use Topics  . Smoking status: Current Every Day Smoker -- 0.50 packs/day for 41 years    Types: Cigarettes  . Smokeless tobacco: Never Used     Comment: Trying to cut back.  . Alcohol Use: No   OB History   Grav Para Term Preterm Abortions TAB SAB  Ect Mult Living                 Review of Systems  Constitutional: Negative for fever and fatigue.  HENT: Negative for congestion and drooling.   Eyes: Negative for pain.  Respiratory: Negative for cough and shortness of breath.   Cardiovascular: Positive for near-syncope. Negative for chest pain.  Gastrointestinal: Positive for nausea, vomiting and diarrhea. Negative for abdominal pain.  Genitourinary: Negative for dysuria and hematuria.  Musculoskeletal: Negative for back pain, gait problem and neck pain.  Skin: Negative for color change.  Neurological: Positive for dizziness and weakness. Negative for headaches.       Near syncope  Hematological: Negative for adenopathy.  Psychiatric/Behavioral: Negative for behavioral problems.  All other systems reviewed and are negative.     Allergies  Flexeril and Soma  Home Medications   Prior to Admission medications   Medication Sig Start Date End Date Taking? Authorizing Provider  albuterol (PROVENTIL HFA;VENTOLIN HFA) 108 (90 BASE) MCG/ACT inhaler Inhale 1-2 puffs into the lungs every 6 (six) hours as needed for wheezing or shortness of breath. 09/26/13   Doy Hutching, MD  amLODipine (NORVASC) 5 MG tablet Take 5 mg by mouth daily.    Historical Provider, MD  aspirin 81 MG tablet Take 81 mg by mouth daily.    Historical Provider, MD  Blood Glucose Monitoring Suppl (RELION PRIME  MONITOR) DEVI 1 each by Does not apply route daily. Dx code 250.00 03/19/13   Jessee Avers, MD  DULoxetine (CYMBALTA) 60 MG capsule Take 60 mg by mouth daily.    Historical Provider, MD  gabapentin (NEURONTIN) 600 MG tablet Take 600 mg by mouth 3 (three) times daily.    Historical Provider, MD  glipiZIDE (GLUCOTROL XL) 5 MG 24 hr tablet Take 5 mg by mouth daily with breakfast.    Historical Provider, MD  glucose blood (RELION PRIME TEST) test strip Check blood sugar daily Dx code 250.00 03/19/13   Jessee Avers, MD  glucose blood test strip Use as  instructed 09/09/13   Rebecca Eaton, MD  hydrochlorothiazide (MICROZIDE) 12.5 MG capsule Take 12.5 mg by mouth daily.    Historical Provider, MD  lisinopril (PRINIVIL,ZESTRIL) 40 MG tablet Take 40 mg by mouth daily.    Historical Provider, MD  metFORMIN (GLUCOPHAGE) 500 MG tablet Take 500 mg by mouth 3 (three) times daily.    Historical Provider, MD  metoprolol (LOPRESSOR) 50 MG tablet Take 50 mg by mouth 2 (two) times daily.    Historical Provider, MD  ondansetron (ZOFRAN) 4 MG tablet Take 1 tablet (4 mg total) by mouth every 6 (six) hours. 09/26/13   Doy Hutching, MD  ondansetron (ZOFRAN-ODT) 4 MG disintegrating tablet Take 4 mg by mouth every 8 (eight) hours as needed for nausea or vomiting.    Historical Provider, MD   BP 76/52  Pulse 77  Resp 20  Wt 210 lb (95.255 kg)  SpO2 89% Physical Exam  Nursing note and vitals reviewed. Constitutional: She is oriented to person, place, and time. She appears well-developed and well-nourished.  obese  HENT:  Head: Normocephalic.  Mouth/Throat: No oropharyngeal exudate.  Eyes: Conjunctivae and EOM are normal. Pupils are equal, round, and reactive to light.  Neck: Normal range of motion. Neck supple.  Cardiovascular: Normal rate, regular rhythm, normal heart sounds and intact distal pulses.  Exam reveals no gallop and no friction rub.   No murmur heard. Pulmonary/Chest: Effort normal and breath sounds normal. No respiratory distress. She has no wheezes.  Abdominal: Soft. Bowel sounds are normal. There is no tenderness. There is no rebound and no guarding.  Musculoskeletal: Normal range of motion. She exhibits no edema and no tenderness.  2+ distal pulses. 2+ femoral pulses.   Neurological: She is alert and oriented to person, place, and time. No cranial nerve deficit. Coordination normal.  1/5 strength in bilateral LE's. Mild altered sensation to light touch in bilateral lower extremities which is unchanged from baseline per the patient. 5/5  strength in UE's. Normal sensation in UE's.   CN's grossly intact.  Normal finger to nose bilaterally.   Nystagmus noted on eye exam.   Skin: Skin is warm and dry.  Psychiatric: She has a normal mood and affect. Her behavior is normal.    ED Course  Procedures (including critical care time) Labs Review Labs Reviewed  COMPREHENSIVE METABOLIC PANEL - Abnormal; Notable for the following:    Sodium 135 (*)    Potassium 3.5 (*)    CO2 17 (*)    BUN 34 (*)    Creatinine, Ser 1.56 (*)    Total Protein 5.9 (*)    Albumin 2.8 (*)    Total Bilirubin <0.2 (*)    GFR calc non Af Amer 35 (*)    GFR calc Af Amer 41 (*)    All other components within normal limits  PRO B  NATRIURETIC PEPTIDE - Abnormal; Notable for the following:    Pro B Natriuretic peptide (BNP) 174.2 (*)    All other components within normal limits  URINALYSIS, ROUTINE W REFLEX MICROSCOPIC - Abnormal; Notable for the following:    APPearance CLOUDY (*)    Specific Gravity, Urine 1.036 (*)    Glucose, UA 250 (*)    Bilirubin Urine SMALL (*)    Protein, ur 30 (*)    All other components within normal limits  URINE MICROSCOPIC-ADD ON - Abnormal; Notable for the following:    Casts HYALINE CASTS (*)    All other components within normal limits  URINE CULTURE  MRSA PCR SCREENING  CBC WITH DIFFERENTIAL  TROPONIN I  PROTIME-INR  LACTIC ACID, PLASMA  BASIC METABOLIC PANEL  CBC  TROPONIN I  TROPONIN I  CBG MONITORING, ED    Imaging Review Ct Head Wo Contrast  09/28/2013   CLINICAL DATA:  Dizziness. Syncopal episode. Lower extremity weakness. Hypotension.  EXAM: CT HEAD WITHOUT CONTRAST  TECHNIQUE: Contiguous axial images were obtained from the base of the skull through the vertex without intravenous contrast.  COMPARISON:  03/09/2013  FINDINGS: There is no evidence of intracranial hemorrhage, brain edema, or other signs of acute infarction. There is no evidence of intracranial mass lesion or mass effect. No abnormal  extraaxial fluid collections are identified.  Bold bilateral basal ganglia lacunae is and chronic small vessel disease are stable in appearance. Ventricles are stable in size. No skull abnormality identified.  IMPRESSION: No acute intracranial findings.  Stable chronic small vessel disease and old bilateral basal ganglia lacunar infarcts.   Electronically Signed   By: Earle Gell M.D.   On: 09/28/2013 15:07   Mr Thoracic Spine Wo Contrast  09/28/2013   CLINICAL DATA:  Weakness in lower extremities, patient not able to walk. Hypotension.  EXAM: MRI THORACIC AND LUMBAR SPINE WITHOUT CONTRAST  TECHNIQUE: Multiplanar and multiecho pulse sequences of the thoracic and lumbar spine were obtained without intravenous contrast.  COMPARISON:  09/28/2013 CT scan  FINDINGS: MR THORACIC SPINE FINDINGS  No thoracic spine fracture or malalignment. No significant abnormal spinal cord signal is observed. Suspected mild right foraminal stenosis due to uncinate spurring at C6-7. Possible degenerative disc disease at C4-5 on the scout images, although this would be better investigated with dedicated cervical spine MRI if clinically warranted.  Despite efforts by the technologist and patient, motion artifact is present on today's exam and could not be eliminated. This reduces exam sensitivity and specificity. Additional findings at individual levels are as follows:  T1-2:  No impingement.  Small central disc protrusion.  T2-3:  No impingement.  Mild right facet arthropathy.  T3-4: No impingement. Right paracentral disc protrusion. Right facet arthropathy.  T4-5:  Unremarkable.  T5-6:  No impingement.  Mild disc bulge.  T6-7:  No impingement.  Left facet arthropathy.  T7-8:  Mild left foraminal stenosis due to facet arthropathy.  T8-9:  Unremarkable.  T9-10: No impingement. Mild disc bulge and minor right lateral recess disc protrusion.  T10-11:  No impingement.  Right lateral recess disc protrusion.  T11-12:  No impingement.  Right  facet arthropathy.  MR LUMBAR SPINE FINDINGS  There is 24 degrees of dextroconvex lumbar scoliosis is measured between L5 and L1.  The lowest lumbar type non-rib-bearing vertebra Ms L5.  Despite efforts by the technologist and patient, motion artifact is present on today's exam and could not be eliminated. This reduces exam sensitivity and specificity. The conus  medullaris appears normal. Conus level: L1.  Rotary component of the scoliosis noted. Paraspinal muscular atrophy noted.  No significant vertebral marrow edema is identified. No vertebral subluxation is observed. Additional findings at individual levels are as follows:  T12-L1:  Unremarkable.  L1-2: Borderline central narrowing of the thecal sac due to disc bulge and facet arthropathy.  L2-3: Moderate central narrowing of the thecal sac, facet arthropathy, and ligamentum flavum redundancy. Borderline left subarticular lateral recess stenosis. Due to disc bulge mild displacement of the left L2 nerve in the lateral extraforaminal space due to intervertebral spurring.  L3-4: Moderate central narrowing of the thecal sac and mild bilateral subarticular lateral recess stenosis due to diffuse disc bulge and facet arthropathy.  L4-5: Moderate central narrowing of the thecal sac with moderate right and mild left foraminal stenosis and moderate right and mild left subarticular lateral recess stenosis due to diffuse disc bulge, facet arthropathy, intervertebral spurring, and left inferior foraminal disc protrusion.  L5-S1: Moderate to prominent right foraminal stenosis and mild right subarticular lateral recess stenosis due to intervertebral and facet spurring along with disc bulge.  IMPRESSION: 1. Thoracolumbar spondylosis and degenerative disc disease cause moderate to prominent impingement at L5-S1; moderate impingement at L2-3, L3-4, and L4-5; and mild impingement at the T7-8 level as noted above. 2. Dextroconvex lumbar scoliosis. 3. No epidural hematoma or  specific acute findings to explain the patient's difficulty with walking identified, although the stenosis in the lumbar spine may be contributory. No acute thoracolumbar fracture identified. No cord abnormality in the thoracic or lumbar spine identified.   Electronically Signed   By: Sherryl Barters M.D.   On: 09/28/2013 19:31   Mr Lumbar Spine Wo Contrast  09/28/2013   CLINICAL DATA:  Weakness in lower extremities, patient not able to walk. Hypotension.  EXAM: MRI THORACIC AND LUMBAR SPINE WITHOUT CONTRAST  TECHNIQUE: Multiplanar and multiecho pulse sequences of the thoracic and lumbar spine were obtained without intravenous contrast.  COMPARISON:  09/28/2013 CT scan  FINDINGS: MR THORACIC SPINE FINDINGS  No thoracic spine fracture or malalignment. No significant abnormal spinal cord signal is observed. Suspected mild right foraminal stenosis due to uncinate spurring at C6-7. Possible degenerative disc disease at C4-5 on the scout images, although this would be better investigated with dedicated cervical spine MRI if clinically warranted.  Despite efforts by the technologist and patient, motion artifact is present on today's exam and could not be eliminated. This reduces exam sensitivity and specificity. Additional findings at individual levels are as follows:  T1-2:  No impingement.  Small central disc protrusion.  T2-3:  No impingement.  Mild right facet arthropathy.  T3-4: No impingement. Right paracentral disc protrusion. Right facet arthropathy.  T4-5:  Unremarkable.  T5-6:  No impingement.  Mild disc bulge.  T6-7:  No impingement.  Left facet arthropathy.  T7-8:  Mild left foraminal stenosis due to facet arthropathy.  T8-9:  Unremarkable.  T9-10: No impingement. Mild disc bulge and minor right lateral recess disc protrusion.  T10-11:  No impingement.  Right lateral recess disc protrusion.  T11-12:  No impingement.  Right facet arthropathy.  MR LUMBAR SPINE FINDINGS  There is 24 degrees of dextroconvex  lumbar scoliosis is measured between L5 and L1.  The lowest lumbar type non-rib-bearing vertebra Ms L5.  Despite efforts by the technologist and patient, motion artifact is present on today's exam and could not be eliminated. This reduces exam sensitivity and specificity. The conus medullaris appears normal. Conus level: L1.  Rotary  component of the scoliosis noted. Paraspinal muscular atrophy noted.  No significant vertebral marrow edema is identified. No vertebral subluxation is observed. Additional findings at individual levels are as follows:  T12-L1:  Unremarkable.  L1-2: Borderline central narrowing of the thecal sac due to disc bulge and facet arthropathy.  L2-3: Moderate central narrowing of the thecal sac, facet arthropathy, and ligamentum flavum redundancy. Borderline left subarticular lateral recess stenosis. Due to disc bulge mild displacement of the left L2 nerve in the lateral extraforaminal space due to intervertebral spurring.  L3-4: Moderate central narrowing of the thecal sac and mild bilateral subarticular lateral recess stenosis due to diffuse disc bulge and facet arthropathy.  L4-5: Moderate central narrowing of the thecal sac with moderate right and mild left foraminal stenosis and moderate right and mild left subarticular lateral recess stenosis due to diffuse disc bulge, facet arthropathy, intervertebral spurring, and left inferior foraminal disc protrusion.  L5-S1: Moderate to prominent right foraminal stenosis and mild right subarticular lateral recess stenosis due to intervertebral and facet spurring along with disc bulge.  IMPRESSION: 1. Thoracolumbar spondylosis and degenerative disc disease cause moderate to prominent impingement at L5-S1; moderate impingement at L2-3, L3-4, and L4-5; and mild impingement at the T7-8 level as noted above. 2. Dextroconvex lumbar scoliosis. 3. No epidural hematoma or specific acute findings to explain the patient's difficulty with walking identified,  although the stenosis in the lumbar spine may be contributory. No acute thoracolumbar fracture identified. No cord abnormality in the thoracic or lumbar spine identified.   Electronically Signed   By: Sherryl Barters M.D.   On: 09/28/2013 19:31   Dg Chest Port 1 View  09/28/2013   CLINICAL DATA:  Hypotension.  Lower extremity weakness.  EXAM: PORTABLE CHEST - 1 VIEW  COMPARISON:  09/25/2013  FINDINGS: The heart size and mediastinal contours are within normal limits. Both lungs are clear. The visualized skeletal structures are unremarkable.  IMPRESSION: No active disease.   Electronically Signed   By: Earle Gell M.D.   On: 09/28/2013 15:50   Ct Angio Chest Aortic Dissect W &/or W/o  09/28/2013   CLINICAL DATA:  Hypotension. Lower extremity paralysis. Clinical suspicion for aortic dissection.  EXAM: CT ANGIOGRAPHY CHEST, ABDOMEN AND PELVIS  TECHNIQUE: Multidetector CT imaging through the chest, abdomen and pelvis was performed using the standard protocol during bolus administration of intravenous contrast. Multiplanar reconstructed images and MIPs were obtained and reviewed to evaluate the vascular anatomy.  CONTRAST:  143mL OMNIPAQUE IOHEXOL 350 MG/ML SOLN  COMPARISON:  Chest CTA on 05/31/12  FINDINGS: CTA CHEST FINDINGS  No evidence of thoracic aortic dissection or aneurysm. No evidence of mediastinal hematoma or mass. Pulmonary arteries are well opacified, and no emboli identified.  No evidence of pleural or pericardial effusion. No evidence of pulmonary infiltrate or mass. No evidence of central endobronchial obstruction. No evidence of thoracic lymphadenopathy. No suspicious bone lesions identified.  Review of the MIP images confirms the above findings.  CTA ABDOMEN AND PELVIS FINDINGS  No evidence of abdominal aortic aneurysm or dissection. No evidence of iliac artery aneurysm or dissection. No evidence of retroperitoneal hemorrhage.  Hepatic steatosis again noted, however no liver masses are identified.  The spleen, pancreas, adrenal glands, and kidneys are normal in appearance.  Prior hysterectomy noted. Adnexal regions are unremarkable. No soft tissue masses or lymphadenopathy identified. No evidence of inflammatory process or abnormal fluid collections. No evidence of dilated bowel loops.  Review of the MIP images confirms the above findings.  IMPRESSION: Negative. No evidence of aortic aneurysm, dissection, or other acute findings.  Stable hepatic steatosis.   Electronically Signed   By: Myles Rosenthal M.D.   On: 09/28/2013 16:04   Ct Angio Abd/pel W/ And/or W/o  09/28/2013   CLINICAL DATA:  Hypotension. Lower extremity paralysis. Clinical suspicion for aortic dissection.  EXAM: CT ANGIOGRAPHY CHEST, ABDOMEN AND PELVIS  TECHNIQUE: Multidetector CT imaging through the chest, abdomen and pelvis was performed using the standard protocol during bolus administration of intravenous contrast. Multiplanar reconstructed images and MIPs were obtained and reviewed to evaluate the vascular anatomy.  CONTRAST:  OMNIPAQUE IOHEXOL 350 MG/ML SOLN  COMPARISON:  Chest CTA on 05/31/12  FINDINGS: CTA CHEST FINDINGS  No evidence of thoracic aortic dissection or aneurysm. No evidence of mediastinal hematoma or mass. Pulmonary arteries are well opacified, and no emboli identified.  No evidence of pleural or pericardial effusion. No evidence of pulmonary infiltrate or mass. No evidence of central endobronchial obstruction. No evidence of thoracic lymphadenopathy. No suspicious bone lesions identified.  Review of the MIP images confirms the above findings.  CTA ABDOMEN AND PELVIS FINDINGS  No evidence of abdominal aortic aneurysm or dissection. No evidence of iliac artery aneurysm or dissection. No evidence of retroperitoneal hemorrhage.  Hepatic steatosis again noted, however no liver masses are identified. The spleen, pancreas, adrenal glands, and kidneys are normal in appearance.  Prior hysterectomy noted. Adnexal regions are  unremarkable. No soft tissue masses or lymphadenopathy identified. No evidence of inflammatory process or abnormal fluid collections. No evidence of dilated bowel loops.  Review of the MIP images confirms the above findings.  IMPRESSION: Negative. No evidence of aortic aneurysm, dissection, or other acute findings.  Stable hepatic steatosis.   Electronically Signed   By: Myles Rosenthal M.D.   On: 09/28/2013 16:04     EKG Interpretation None      Date: 09/28/2013  Rate: 71  Rhythm: normal sinus rhythm  QRS Axis: normal  Intervals: normal  ST/T Wave abnormalities: nonspecific ST/T changes  Conduction Disutrbances:none  Narrative Interpretation: NSR, NSSTT's  Old EKG Reviewed: no significant change  CRITICAL CARE Performed by: Zoila Ditullio Mort Sawyers Total critical care time: 30 min Critical care time was exclusive of separately billable procedures and treating other patients. Critical care was necessary to treat or prevent imminent or life-threatening deterioration. Critical care was time spent personally by me on the following activities: development of treatment plan with patient and/or surrogate as well as nursing, discussions with consultants, evaluation of patient's response to treatment, examination of patient, obtaining history from patient or surrogate, ordering and performing treatments and interventions, ordering and review of laboratory studies, ordering and review of radiographic studies, pulse oximetry and re-evaluation of patient's condition.  MDM   Final diagnoses:  Hypotension  Lower extremity weakness  Hypoxia    2:34 PM 63 y.o. female w hx of HTN, DM, COPD, CHF s/p low risk stress nuclear study with anterior wall mild defect most likely secondary to breast attenuation on 07/18/13 who presents with hypotension. The patient states that she was sitting and letting her dog use the bathroom at approximately 9 AM this morning when she had sudden onset dizziness. She notes that her  symptoms were significantly worsened by standing. She states that she was able to abate her symptoms by sitting. She did fall while walking to her chair. She had several episodes of near syncope. She called an on-call physician who recommended she come to the ER. She was found  to be hypotensive at home with a systolic blood pressure in the 60s. She currently has no complaints on exam. Her initial BP here is 76/52. She has increased difficulty lifting her lower extremities off the bed. She has normal pulses throughout. Her blood pressure appears to be improving with IV fluids. Pt outside of stroke window, will get CT head stat regardless.   The patient was seen here 2 days ago for nausea vomiting and diarrhea. She had noncontributory workup. She states that her symptoms had improved and she no longer has the symptoms.  CT head neg. Will get CTA CAP to r/o aortic dissection given bilateral LE's weakness.   CTA neg. I consulted Neurology who recommends MRI T/L spine. Will admit to step down bed to internal medicine teaching service.    Blanchard Kelch, MD 09/28/13 2127

## 2013-09-28 NOTE — ED Notes (Signed)
Pt transported to unit on cardiac monitor.

## 2013-09-28 NOTE — Consult Note (Signed)
Reason for Consult: Referring Physician: Dr Aline Brochure  CC: Lower extremity weakness  HPI: [name] is a  63 y.o. female with a history of hypertension, COPD ( still smokes ), depression, previous TIA, congestive heart failure, diabetes, and diabetic neuropathy. She was recently seen in the emergency department on 09/25/2013 for evaluation of multiple episodes of nausea, vomiting and diarrhea. She returned today with weakness, dizziness, presyncope, syncope, hypotension, dehydration, tachycardia, hypokalemia, hyponatremia, and elevated BUN and creatinine levels. Neurology has been asked to see the patient for bilateral lower extremity weakness. CT of the head, CT angiogram of the chest abdomen and pelvis were all unremarkable. MRIs of the thoracic and lumbar spines have been ordered.  The patient reported that she tried to get out of a chair today. When she stood she became very lightheaded, felt weak, and fell. She called her physician and was instructed to come the emergency department. She arrived by EMS. Her legs are now so weak that she is unable to lift them off the stretcher. She is unable to move her feet. She has noted no change in her lower extremity sensation; however, she does have diabetic neuropathy with continued to numbness, pins and needles. She denies any change in her bowel or bladder habits other than the recent diarrhea which has resolved.   Past Medical History  Diagnosis Date  . Hypertension   . Diabetes mellitus without complication     diagnosed around 2010, only ever on metformin  . Chronic pain     neck pain, headache, neuropathy  . Hypertriglyceridemia   . Neuromuscular disorder   . COPD (chronic obstructive pulmonary disease)   . CHF (congestive heart failure)   . Depression   . Puncture wound of foot, right 05/17/2012    Tetanus shot 3 yrs ago at [facility] in [place], per pt report     Past Surgical History  Procedure Laterality Date  . Rotator cuff  repair    . Carpal tunnel release    . Abdominal hysterectomy    . Cholecystectomy      Family History  Problem Relation Age of Onset  . Hyperlipidemia Mother   . Heart attack Father 79  . Hypertension Father   . Cancer Paternal Grandfather     Lung cancer  . Cancer Maternal Grandmother   . Heart attack Maternal Grandmother     Social History:  reports that she has been smoking Cigarettes.  She has a 20.5 pack-year smoking history. She has never used smokeless tobacco. She reports that she does not drink alcohol or use illicit drugs.  Allergies  Allergen Reactions  . Flexeril [Cyclobenzaprine] Other (See Comments)    Prolonged QTc to 571, tachycardia  . Soma [Carisoprodol] Itching and Rash    Medications: No current facility-administered medications for this encounter.   Current Outpatient Prescriptions  Medication Sig Dispense Refill  . albuterol (PROVENTIL HFA;VENTOLIN HFA) 108 (90 BASE) MCG/ACT inhaler Inhale 1-2 puffs into the lungs every 6 (six) hours as needed for wheezing or shortness of breath.  1 Inhaler  0  . amLODipine (NORVASC) 5 MG tablet Take 5 mg by mouth daily.      Marland Kitchen aspirin 81 MG tablet Take 81 mg by mouth daily.      . Blood Glucose Monitoring Suppl (RELION PRIME MONITOR) DEVI 1 each by Does not apply route daily. Dx code 250.00  1 Device  1  . DULoxetine (CYMBALTA) 60 MG capsule Take 60 mg by mouth daily.      Marland Kitchen  gabapentin (NEURONTIN) 600 MG tablet Take 600 mg by mouth 3 (three) times daily.      Marland Kitchen glipiZIDE (GLUCOTROL XL) 5 MG 24 hr tablet Take 5 mg by mouth daily with breakfast.      . glucose blood (RELION PRIME TEST) test strip Check blood sugar daily Dx code 250.00  50 each  12  . glucose blood test strip Use as instructed  100 each  12  . hydrochlorothiazide (MICROZIDE) 12.5 MG capsule Take 12.5 mg by mouth daily.      Marland Kitchen lisinopril (PRINIVIL,ZESTRIL) 40 MG tablet Take 40 mg by mouth daily.      . metFORMIN (GLUCOPHAGE) 500 MG tablet Take 500 mg by  mouth 3 (three) times daily.      . metoprolol (LOPRESSOR) 50 MG tablet Take 50 mg by mouth 2 (two) times daily.      . ondansetron (ZOFRAN) 4 MG tablet Take 1 tablet (4 mg total) by mouth every 6 (six) hours.  20 tablet  0  . ondansetron (ZOFRAN-ODT) 4 MG disintegrating tablet Take 4 mg by mouth every 8 (eight) hours as needed for nausea or vomiting.         ROS: History obtained from the patient  General ROS: negative for - chills, fatigue, fever, night sweats, weight gain or weight loss. Positive for generalized weakness. Frequent headaches. Psychological ROS: negative for - behavioral disorder, hallucinations, memory difficulties, mood swings or suicidal ideation. Positive for depression. Ophthalmic ROS: negative for - blurry vision, double vision, eye pain or loss of vision ENT ROS: negative for - epistaxis, nasal discharge, oral lesions, sore throat, tinnitus or vertigo Allergy and Immunology ROS: negative for - hives or itchy/watery eyes Hematological and Lymphatic ROS: negative for - bleeding problems, bruising or swollen lymph nodes Endocrine ROS: negative for - galactorrhea, hair pattern changes, polydipsia/polyuria or temperature intolerance Respiratory ROS: negative for - cough, hemoptysis, or wheezing. Recent cough productive of clear sputum with shortness of breath. She has had some chest pain which she feels is secondary to her coughing. Cardiovascular ROS: negative for edema or irregular heartbeat.  Gastrointestinal ROS: negative for - abdominal pain, hematemesis, Positive for recent nausea vomiting and diarrhea. Genito-Urinary ROS: negative for - dysuria, hematuria, incontinence or urinary frequency/urgency. Positive for recent UTI - treated Musculoskeletal ROS: Positive for chronic musculoskeletal pain. She sleeps in a recliner secondary to neck pain. Neurological ROS: as noted in HPI Dermatological ROS: negative for rash and skin lesion changes   Physical  Examination: Blood pressure 96/56, pulse 71, temperature 97.7 F (36.5 C), temperature source Temporal, resp. rate 18, weight 210 lb (95.255 kg), SpO2 97.00%.  Neurologic Examination Mental Status: Alert, oriented, thought content appropriate.  Speech fluent without evidence of aphasia.  Able to follow 3 step commands without difficulty. Cranial Nerves: II: Discs visualized; Visual fields grossly normal, pupils equal, round, reactive to light and accommodation III,IV, VI: ptosis not present, extra-ocular motions intact bilaterally V,VII: smile symmetric, facial light touch sensation normal bilaterally VIII: hearing normal bilaterally IX,X: gag reflex present XI: bilateral shoulder shrug XII: midline tongue extension Motor: Right : Upper extremity   5/5    Left:     Upper extremity   5/5  Lower extremity   0/5     Lower extremity   0/5  Bulk:normal tone throughout; no atrophy noted. Tone fluctuated in both lower extremities. Sensory: Light touch intact in both upper extremities. Decreased in both lower extremities. Decreased sensation to pain in both lower extremities. Deep  Tendon Reflexes: 2+ and symmetric in the upper extremities. Absent in the lower extremities. Plantars: Right: downgoing   Left: downgoing Cerebellar: normal finger-to-nose, normal rapid alternating movements in both upper extremities. Unable to perform heel-to-shin testing. Gait: Did not attempt ambulation for safety reasons. CV: pulses palpable throughout   Laboratory Studies:   Basic Metabolic Panel:  Recent Labs Lab 09/25/13 2227 09/28/13 1507  NA 135* 135*  K 3.5* 3.5*  CL 99 100  CO2 16* 17*  GLUCOSE 212* 92  BUN 19 34*  CREATININE 0.75 1.56*  CALCIUM 9.0 8.7    Liver Function Tests:  Recent Labs Lab 09/28/13 1507  AST 22  ALT 29  ALKPHOS 50  BILITOT <0.2*  PROT 5.9*  ALBUMIN 2.8*   No results found for this basename: LIPASE, AMYLASE,  in the last 168 hours No results found for this  basename: AMMONIA,  in the last 168 hours  CBC:  Recent Labs Lab 09/25/13 2227 09/28/13 1507  WBC 6.1 7.4  NEUTROABS  --  4.1  HGB 15.3* 12.5  HCT 44.5 37.8  MCV 93.1 94.7  PLT 187 185    Cardiac Enzymes:  Recent Labs Lab 09/28/13 1507  TROPONINI <0.30    BNP: No components found with this basename: POCBNP,   CBG:  Recent Labs Lab 09/28/13 1451  [place]    Microbiology: Results for orders placed during the hospital encounter of 08/29/13  URINE CULTURE     Status: None   Collection Time    08/29/13 10:29 PM      Result Value Ref Range Status   Specimen Description URINE, CLEAN CATCH   Final   Special Requests keflex Normal   Final   Culture  Setup Time     Final   Value: 08/30/2013 09:06     Performed at [facility]     Final   Value: >=100,000 COLONIES/ML     Performed at [facility]   Culture     Final   Value: ESCHERICHIA COLI     Performed at [facility]   Report Status 09/01/2013 FINAL   Final   Organism ID, Bacteria ESCHERICHIA COLI   Final    Coagulation Studies:  Recent Labs  09/28/13 1507  LABPROT 12.5  INR 0.95    Urinalysis: No results found for this basename: COLORURINE, APPERANCEUR, LABSPEC, PHURINE, GLUCOSEU, HGBUR, BILIRUBINUR, KETONESUR, PROTEINUR, UROBILINOGEN, NITRITE, LEUKOCYTESUR,  in the last 168 hours  Lipid Panel:     Component Value Date/Time   CHOL 209* 03/10/2013 0008   TRIG 399* 03/10/2013 0008   HDL 34* 03/10/2013 0008   CHOLHDL 6.1 03/10/2013 0008   VLDL 80* 03/10/2013 0008   LDLCALC 95 03/10/2013 0008    HgbA1C:  Lab Results  Component Value Date   HGBA1C 8.2 09/09/2013    Urine Drug Screen:     Component Value Date/Time   LABOPIA NONE DETECTED 03/09/2013 2346   COCAINSCRNUR NONE DETECTED 03/09/2013 2346   LABBENZ NONE DETECTED 03/09/2013 2346   AMPHETMU NONE DETECTED 03/09/2013 2346   THCU NONE DETECTED 03/09/2013 2346   LABBARB NONE DETECTED 03/09/2013 2346     Alcohol Level: No results found for this basename: ETH,  in the last 168 hours  Other results: EKG: Sinus tachycardia rate 117 beats per minute. Please refer to the formal reading for complete details.   Imaging:   Ct Head Wo Contrast 09/28/2013    No acute intracranial findings.  Stable chronic small vessel disease and old bilateral basal ganglia lacunar infarcts.      Dg Chest Port 1 View 09/28/2013    No active disease.    Ct Angio Chest Aortic Dissect W &/or W/o 09/28/2013    Negative. No evidence of aortic aneurysm, dissection, or other acute findings.  Stable hepatic steatosis.      Ct Angio Abd/pel W/ And/or W/o 09/28/2013     Negative. No evidence of aortic aneurysm, dissection, or other acute findings.  Stable hepatic steatosis.       Assessment/Plan:  63 year old female who initially reported dizziness, presyncope, syncope and falls but is now unable to move her lower extremities. Questionable amount of effort put forth on the part of the patient during the exam. The patient does appear to be dehydrated from her recent illness with nausea vomiting and diarrhea. She was hypotensive and tachycardic on arrival which probably accounts for her presyncope and possibly her falls. Etiology of lower extremity weakness is unclear at this time.   Mikey Bussing PA-C [facility] Pager [phone] 09/28/2013, 5:56 PM  This patient was personally evaluated by me. I suspect there is significant psychophysiologic factors contributing to this patient's symptomatology. There was poor effort with attempting to evaluate strength of lower extremities. Patient has no objective signs of myelopathy nor radiculopathy involving lower extremities. MRI studies were unremarkable.  Recommendations: No further neurodiagnostic studies are indicated at this point. Recommend physical therapy consultation and intervention regarding  strength of lower extremities and gait.  We will  continue to follow this patient with you.  Rush Farmer M.D. [facility] [phone]

## 2013-09-28 NOTE — ED Notes (Signed)
Pt taken to ct and returned with RN

## 2013-09-28 NOTE — ED Notes (Signed)
cbg 90  

## 2013-09-29 ENCOUNTER — Encounter (HOSPITAL_COMMUNITY): Payer: Self-pay | Admitting: *Deleted

## 2013-09-29 DIAGNOSIS — R269 Unspecified abnormalities of gait and mobility: Secondary | ICD-10-CM

## 2013-09-29 DIAGNOSIS — I959 Hypotension, unspecified: Principal | ICD-10-CM

## 2013-09-29 LAB — CBC
HEMATOCRIT: 39.9 % (ref 36.0–46.0)
Hemoglobin: 13.4 g/dL (ref 12.0–15.0)
MCH: 31.8 pg (ref 26.0–34.0)
MCHC: 33.6 g/dL (ref 30.0–36.0)
MCV: 94.5 fL (ref 78.0–100.0)
Platelets: 158 10*3/uL (ref 150–400)
RBC: 4.22 MIL/uL (ref 3.87–5.11)
RDW: 12.9 % (ref 11.5–15.5)
WBC: 4.5 10*3/uL (ref 4.0–10.5)

## 2013-09-29 LAB — TROPONIN I: Troponin I: <0.3 ng/mL (ref <0.30)

## 2013-09-29 LAB — GLUCOSE, CAPILLARY
GLUCOSE-CAPILLARY: 208 mg/dL — AB (ref 70–99)
GLUCOSE-CAPILLARY: 220 mg/dL — AB (ref 70–99)
GLUCOSE-CAPILLARY: 267 mg/dL — AB (ref 70–99)
Glucose-Capillary: 115 mg/dL — ABNORMAL HIGH (ref 70–99)
Glucose-Capillary: 274 mg/dL — ABNORMAL HIGH (ref 70–99)
Glucose-Capillary: 322 mg/dL — ABNORMAL HIGH (ref 70–99)
Glucose-Capillary: 92 mg/dL (ref 70–99)

## 2013-09-29 LAB — BASIC METABOLIC PANEL
BUN: 19 mg/dL (ref 6–23)
CO2: 18 mEq/L — ABNORMAL LOW (ref 19–32)
CREATININE: 0.72 mg/dL (ref 0.50–1.10)
Calcium: 8.9 mg/dL (ref 8.4–10.5)
Chloride: 105 mEq/L (ref 96–112)
GFR calc non Af Amer: >90 mL/min (ref >90)
GLUCOSE: 219 mg/dL — AB (ref 70–99)
Potassium: 4.1 mEq/L (ref 3.7–5.3)
Sodium: 135 mEq/L — ABNORMAL LOW (ref 137–147)

## 2013-09-29 MED ORDER — PREDNISONE 20 MG PO TABS
40.0000 mg | ORAL_TABLET | Freq: Every day | ORAL | Status: DC
  Administered 2013-09-30: 40 mg via ORAL
  Filled 2013-09-29 (×2): qty 2

## 2013-09-29 MED ORDER — METOPROLOL TARTRATE 12.5 MG HALF TABLET
12.5000 mg | ORAL_TABLET | Freq: Two times a day (BID) | ORAL | Status: DC
  Administered 2013-09-29 – 2013-09-30 (×3): 12.5 mg via ORAL
  Filled 2013-09-29 (×4): qty 1

## 2013-09-29 MED ORDER — AMLODIPINE BESYLATE 5 MG PO TABS
5.0000 mg | ORAL_TABLET | Freq: Every day | ORAL | Status: DC
  Administered 2013-09-29 – 2013-09-30 (×2): 5 mg via ORAL
  Filled 2013-09-29 (×2): qty 1

## 2013-09-29 NOTE — Progress Notes (Signed)
Subjective: Sitting on recliner at bedside. Pt says she is Doing much better than yesterday. Pt lives alone.   Objective: Vital signs in last 24 hours: Filed Vitals:   09/29/13 0534 09/29/13 0600 09/29/13 0723 09/29/13 0805  BP: 142/67 132/67  137/72  Pulse: 85 86  105  Temp: 98 F (36.7 C)   97.6 F (36.4 C)  TempSrc: Oral   Oral  Resp: 24 22  16   Height:      Weight:      SpO2: 91% 91% 92% 94%   Weight change:   Intake/Output Summary (Last 24 hours) at 09/29/13 1101 Last data filed at 09/28/13 2100  Gross per 24 hour  Intake   2000 ml  Output    500 ml  Net   1500 ml   GENERAL- alert, co-operative, appears as stated age, not in any distress.  HEENT- Atraumatic, normocephalic, PERRL, EOMI, oral mucosa appears moist.  CARDIAC- RRR, no murmurs, rubs or gallops.  RESP- Moving equal volumes of air, with expiratory wheezes heard bilaterally.  ABDOMEN- Soft, nontender, no palpable masses or organomegaly, bowel sounds present.  NEURO- Cr N 2-12 intact, strenght 5/5 in all extremities, DTRs- 2+ bilaterally.  EXTREMITIES- pulse 2+, symmetric, no pedal edema.  SKIN- Warm, dry, telangiectasias on her cheeks..   Lab Results: Basic Metabolic Panel:  Recent Labs Lab 09/28/13 1507 09/29/13 0502  NA 135* 135*  K 3.5* 4.1  CL 100 105  CO2 17* 18*  GLUCOSE 92 219*  BUN 34* 19  CREATININE 1.56* 0.72  CALCIUM 8.7 8.9   Liver Function Tests:  Recent Labs Lab 09/28/13 1507  AST 22  ALT 29  ALKPHOS 50  BILITOT <0.2*  PROT 5.9*  ALBUMIN 2.8*   CBC:  Recent Labs Lab 09/28/13 1507 09/29/13 0502  WBC 7.4 4.5  NEUTROABS 4.1  --   HGB 12.5 13.4  HCT 37.8 39.9  MCV 94.7 94.5  PLT 185 158   Cardiac Enzymes:  Recent Labs Lab 09/28/13 1507 09/29/13 0050 09/29/13 0502  TROPONINI <0.30 <0.30 <0.30   BNP:  Recent Labs Lab 09/28/13 1507  PROBNP 174.2*   CBG:  Recent Labs Lab 09/28/13 1451 09/28/13 2018 09/29/13 0005 09/29/13 0536 09/29/13 0802    GLUCAP 90 92 274* 208* 220*   Coagulation:  Recent Labs Lab 09/28/13 1507  LABPROT 12.5  INR 0.95    Urinalysis:  Recent Labs Lab 09/28/13 1706  COLORURINE YELLOW  LABSPEC 1.036*  PHURINE 5.0  GLUCOSEU 250*  HGBUR NEGATIVE  BILIRUBINUR SMALL*  KETONESUR NEGATIVE  PROTEINUR 30*  UROBILINOGEN 0.2  NITRITE NEGATIVE  LEUKOCYTESUR NEGATIVE    Micro Results: Recent Results (from the past 240 hour(s))  MRSA PCR SCREENING     Status: None   Collection Time    09/28/13  7:42 PM      Result Value Ref Range Status   MRSA by PCR NEGATIVE  NEGATIVE Final   Comment:            The GeneXpert MRSA Assay (FDA     approved for NASAL specimens     only), is one component of a     comprehensive MRSA colonization     surveillance program. It is not     intended to diagnose MRSA     infection nor to guide or     monitor treatment for     MRSA infections.   Studies/Results: Ct Head Wo Contrast  09/28/2013   CLINICAL DATA:  Dizziness. Syncopal episode. Lower extremity weakness. Hypotension.  IMPRESSION: No acute intracranial findings.  Stable chronic small vessel disease and old bilateral basal ganglia lacunar infarcts.   Electronically Signed   By: Earle Gell M.D.   On: 09/28/2013 15:07   Mr Thoracic Spine Wo Contrast  09/28/2013   CLINICAL DATA:  Weakness in lower extremities, patient not able to walk. Hypotension.  IMPRESSION: 1. Thoracolumbar spondylosis and degenerative disc disease cause moderate to prominent impingement at L5-S1; moderate impingement at L2-3, L3-4, and L4-5; and mild impingement at the T7-8 level as noted above. 2. Dextroconvex lumbar scoliosis. 3. No epidural hematoma or specific acute findings to explain the patient's difficulty with walking identified, although the stenosis in the lumbar spine may be contributory. No acute thoracolumbar fracture identified. No cord abnormality in the thoracic or lumbar spine identified.   Electronically Signed   By: Sherryl Barters M.D.   On: 09/28/2013 19:31   Mr Lumbar Spine Wo Contrast  09/28/2013   CLINICAL DATA:  Weakness in lower extremities, patient not able to walk. Hypotension.   IMPRESSION: 1. Thoracolumbar spondylosis and degenerative disc disease cause moderate to prominent impingement at L5-S1; moderate impingement at L2-3, L3-4, and L4-5; and mild impingement at the T7-8 level as noted above. 2. Dextroconvex lumbar scoliosis. 3. No epidural hematoma or specific acute findings to explain the patient's difficulty with walking identified, although the stenosis in the lumbar spine may be contributory. No acute thoracolumbar fracture identified. No cord abnormality in the thoracic or lumbar spine identified.   Electronically Signed   By: Sherryl Barters M.D.   On: 09/28/2013 19:31   Dg Chest Port 1 View  09/28/2013   CLINICAL DATA:  Hypotension.  Lower extremity weakness.  EXAM: PORTABLE CHEST - 1 VIEW  COMPARISON:  09/25/2013  FINDINGS: The heart size and mediastinal contours are within normal limits. Both lungs are clear. The visualized skeletal structures are unremarkable.  IMPRESSION: No active disease.   Electronically Signed   By: Earle Gell M.D.   On: 09/28/2013 15:50   Ct Angio Chest Aortic Dissect W &/or W/o  09/28/2013   CLINICAL DATA:  Hypotension. Lower extremity paralysis. Clinical suspicion for aortic dissection.   IMPRESSION: Negative. No evidence of aortic aneurysm, dissection, or other acute findings.  Stable hepatic steatosis.   Electronically Signed   By: Earle Gell M.D.   On: 09/28/2013 16:04   Ct Angio Abd/pel W/ And/or W/o  09/28/2013   CLINICAL DATA:  Hypotension. Lower extremity paralysis. IMPRESSION: Negative. No evidence of aortic aneurysm, dissection, or other acute findings.  Stable hepatic steatosis.   Electronically Signed   By: Earle Gell M.D.   On: 09/28/2013 16:04   Medications: I have reviewed the patient's current medications. Scheduled Meds: . amLODipine  5 mg Oral  Daily  . aspirin  81 mg Oral Daily  . DULoxetine  60 mg Oral Daily  . heparin  5,000 Units Subcutaneous 3 times per day  . insulin aspart  0-15 Units Subcutaneous 6 times per day  . ipratropium-albuterol  3 mL Nebulization BID  . metoprolol  12.5 mg Oral BID  . potassium chloride  40 mEq Oral Once  . sodium chloride  3 mL Intravenous Q12H   Continuous Infusions: . sodium chloride 100 mL/hr at 09/28/13 2154   PRN Meds:.HYDROcodone-acetaminophen, ipratropium-albuterol, ondansetron (ZOFRAN) IV, ondansetron Assessment/Plan:  Hypotension- Likely due to GI losses and poor Po intake. - Transfer to Med- surg. - Cont IV n/s at  100cc/hr  - Zofran 4mg  Q4H  - Carb modified diet.   AKI - Pre-renal picture, BUN 34, Cr- 1.56, Baseline Cr- 0.75. Likely due to severe dehydration. Has improved, and back to baseline on Labs today- Cr- 0.7  - Maintain at 100cc/hr.   Increased Anion gap- Calculate at 18. Most likely due to starvation ketosis, possible hypotension contributing and Acute kidney injury. Lactic acid WNL.  - Hydrate   DM2- Home meds- Metformin- 500mg  TID.  - SSI   COPD- has a cough that is resolving. No SOB.  - Solumedrol 60mg  Once  - Duonebs- Q4H.   HTN- Homemeds- Amlodipine- 5mg  daily, metoprolol 50mg  and lisinopril- 40mg  daily. BP this am- 137/72. - Gradually reintroduce BP meds, Metop and amlodipine.  CHF Diastolic, stage 1- prior documentation of stage 2 on ECHO- 03/10/2014- 55-60%.  - Gentle fluid hydration.  - Daily weights   Tobacco Use- Smokes about 1/2 a pack per day, and has smoked since She was 63 years old.    Dispo: Disposition is deferred at this time, awaiting improvement of current medical problems.    The patient does have a current PCP Rebecca Eaton, MD) and does need an [facility] hospital follow-up appointment after discharge.  The patient does have transportation limitations that hinder transportation to clinic appointments.  .Services Needed at time of  discharge: Y = Yes, Blank = No PT:   OT:   RN:   Equipment:   Other:     LOS: 1 day   Jenetta Downer, MD 09/29/2013, 11:01 AM

## 2013-09-29 NOTE — Progress Notes (Signed)
Subjective: Patient feels legs are back to normal.   Exam: Filed Vitals:   09/29/13 0805  BP: 137/72  Pulse: 105  Temp: 97.6 F (36.4 C)  Resp: 16   Gen: In bed, NAD MS: Awake, alert interactive and appropriate FY:BOFBP, VFF Motor: 5/5 throughout Sensory:intact to LT Able to stand with good strenght, gait not tested due to monitors.   Impression: 63 yo F With perceived weakness of the lower extremities and dizziness in the setting of hypotension.  One possibility would be asthenia in the setting of hypotension. With improvement, I would not pursue any furtehr testing.   Recommendations: 1) No further recommendations at this time, please call with any further questions.   Roland Rack, MD [facility] [phone]  If 7pm- 7am, please page neurology on call as listed in [place].

## 2013-09-29 NOTE — H&P (Signed)
INTERNAL MEDICINE TEACHING SERVICE Attending Admission Note  Date: 09/29/2013  Patient name: [name]  Medical record number: [id number]  Date of birth: 1950-07-19    I have seen and evaluated [name] and discussed their care with the Residency Team.  63 yr old woman with pmhx significant for Type 2 DM, COPD, HTN, HFpEF, HT, TIA, depression, presented with weakness, dizziness, nausea, vomiting, diarrhea. She admits to feeling "sick" over 2 weeks. She states in the past week her episode progressed to 4-5 times per day. She denied CP, SOB, melena, hematochezia. She admitted to tingling sensation in her lower extremities. She was noted to have a BP of 76/52 in the ED along with a creatinine of 1.56 (baseline 0.75). Neurology was consulted due to LE weakness. A CT of the head, CTA chest and abdomen were performed with unremarkable findings. In addition, MRI's of the T/L spine were ordered with only evidence of DDD and mild impingement at a few levels, but no cord compression. Neurology felt psychophysiologic factors playing a role. On exam, Filed Vitals:   09/29/13 0805  BP: 137/72  Pulse: 105  Temp: 97.6 F (36.4 C)  Resp: 16   GEN: AAOx3, NAD HEENT: EOMI, PERRL, no icterus, no adenopathy. CV: S1S2, no m/r/g,tachy but reg rhythm. PULM: slight wheeze R. ABD/GI: Soft, NT, +BS, no guarding, no distention. LE: 2+ pulses, no c/c/e. MSK: 4/5 strength LE bilat with 2+ DTRs bilat LE. 5/5 strength UE bilat with 2+ DTRs bilat.  This morning, she feels improved and can move her legs without difficulty.  Hypotension likely due to significant GI losses to to recent gastroenteritis. She appears euvolemic this morning. Check orthostatics. AKI has resolved, suspect complete pre-renal picture. As far as her HTN, would hold diuretic therapy. May restart Metoprolol and half dose of home amlodipine. Hold ACE as well. She can follow up with her PCP to restart other BP meds. PT eval today. Suspect  ok to D/C by tomorrow morning.  Dominic Pea, DO, [facility] 09/29/2013, 11:13 AM

## 2013-09-29 NOTE — Progress Notes (Signed)
Utilization review completed.  

## 2013-09-29 NOTE — Progress Notes (Signed)
Pt tolerating OOB in chair, participating with ADLs Sats >92% room air. Awaiting Physical therapy evaluation for ambulation status without oxygen. Pt educated on importance of taking Blood pressure medications according to directions. Pt prepared for transfer at 12 noon to [id number].

## 2013-09-29 NOTE — Progress Notes (Signed)
PT Cancellation Note  Patient Details Name: [name] MRN: [id number] DOB: [birth year]   Cancelled Treatment:    Reason Eval/Treat Not Completed: Other (comment)  Pt politely declined PT due to nausea  Will reattempt PT eval tomorrow  Thank you,  Roney Marion, PT  [facility] Pager [phone] Office [phone]    Quin Hoop 09/29/2013, 4:31 PM

## 2013-09-30 LAB — URINE CULTURE
COLONY COUNT: NO GROWTH
Culture: NO GROWTH

## 2013-09-30 LAB — GLUCOSE, CAPILLARY
GLUCOSE-CAPILLARY: 127 mg/dL — AB (ref 70–99)
GLUCOSE-CAPILLARY: 198 mg/dL — AB (ref 70–99)
Glucose-Capillary: 122 mg/dL — ABNORMAL HIGH (ref 70–99)
Glucose-Capillary: 168 mg/dL — ABNORMAL HIGH (ref 70–99)

## 2013-09-30 MED ORDER — IPRATROPIUM-ALBUTEROL 0.5-2.5 (3) MG/3ML IN SOLN: 3.0000 mL | Freq: Four times a day (QID) | RESPIRATORY_TRACT | Status: DC | PRN

## 2013-09-30 MED ORDER — PANTOPRAZOLE SODIUM 20 MG PO TBEC: 20.0000 mg | DELAYED_RELEASE_TABLET | Freq: Every day | ORAL | Status: DC

## 2013-09-30 NOTE — Progress Notes (Signed)
SATURATION QUALIFICATIONS: (This note is used to comply with regulatory documentation for home oxygen)  Patient Saturations on Room Air at Rest = 96  Patient Saturations on Room Air while Ambulating = 94   

## 2013-09-30 NOTE — Progress Notes (Signed)
Inpatient Diabetes Program Recommendations  AACE/ADA: New Consensus Statement on Inpatient Glycemic Control (2013)  Target Ranges:  Prepandial:   less than 140 mg/dL      Peak postprandial:   less than 180 mg/dL (1-2 hours)      Critically ill patients:  140 - 180 mg/dL   Inpatient Diabetes Program Recommendations Correction (SSI): change to TID ac + HS since patient is eating Thank you  Raoul Pitch BSN, RN,CDE Inpatient Diabetes Coordinator [phone] (team pager)

## 2013-09-30 NOTE — Discharge Summary (Signed)
Name: [name] MRN: [id number] DOB: 1950-11-14 63 y.o. PCP: Rebecca Eaton, MD  Date of Admission: 09/28/2013  2:09 PM Date of Discharge: 09/30/2013 Attending Physician: Dominic Pea, DO  Discharge Diagnosis: Principal Problem:   Hypotension Active Problems:   Type 2 diabetes, uncontrolled, with neuropathy   Abnormality of gait   Tobacco use disorder   Volume depletion  Discharge Medications:   Medication List    STOP taking these medications       hydrochlorothiazide 12.5 MG capsule  Commonly known as:  MICROZIDE     lisinopril 40 MG tablet  Commonly known as:  PRINIVIL,ZESTRIL      TAKE these medications       albuterol 108 (90 BASE) MCG/ACT inhaler  Commonly known as:  PROVENTIL HFA;VENTOLIN HFA  Inhale 1-2 puffs into the lungs every 6 (six) hours as needed for wheezing or shortness of breath.     amLODipine 5 MG tablet  Commonly known as:  NORVASC  Take 5 mg by mouth every evening.     aspirin 81 MG tablet  Take 81 mg by mouth every morning.     DULoxetine 60 MG capsule  Commonly known as:  CYMBALTA  Take 60 mg by mouth every morning.     gabapentin 600 MG tablet  Commonly known as:  NEURONTIN  Take 600 mg by mouth 3 (three) times daily.     glipiZIDE 5 MG 24 hr tablet  Commonly known as:  GLUCOTROL XL  Take 5 mg by mouth daily with supper.     metFORMIN 500 MG tablet  Commonly known as:  GLUCOPHAGE  Take 500 mg by mouth 2 (two) times daily.     metoprolol 50 MG tablet  Commonly known as:  LOPRESSOR  Take 50 mg by mouth 2 (two) times daily.     ondansetron 4 MG disintegrating tablet  Commonly known as:  ZOFRAN-ODT  Take 4 mg by mouth every 8 (eight) hours as needed for nausea or vomiting.     pantoprazole 20 MG tablet  Commonly known as:  PROTONIX  Take 1 tablet (20 mg total) by mouth daily.        Disposition and follow-up:   [name] was discharged from [facility], [place] in stable condition.  At the  hospital follow up visit please address:  1.  Any weakness of her lower extremities since discharge, any dizziness? Reintroduction of her Bp meds. Restarted usual dose of Amlodipine and Metoprolol but her Lisinopril and HCTz were held as she was markedly hypotensive on admission.  2.  Labs / imaging needed at time of follow-up: BMet, complete and continued resolution Of her AKI.  3.  Pending labs/ test needing follow-up: none.  Follow-up Appointments:     Follow-up Information   Follow up with Blain Pais, MD On 10/08/2013. (8:45am)    Specialty:  Internal Medicine   Contact information:   [address] [place] [address] [phone]       Discharge Instructions: Discharge Orders   Future Appointments Provider Department Dept Phone   10/07/2013 8:45 AM Blain Pais, MD [facility] [place] [phone]   12/02/2013 1:15 PM Rebecca Eaton, MD [place] [phone]   Future Orders Complete By Expires   Call MD for:  persistant dizziness or light-headedness  As directed    Diet - low sodium heart healthy  As directed    Discharge instructions  As directed  Increase activity slowly  As directed       Consultations:    Procedures Performed:  Ct Head Wo Contrast  09/28/2013   CLINICAL DATA:  Dizziness. Syncopal episode. Lower extremity weakness. Hypotension.  EXAM: CT HEAD WITHOUT CONTRAST  TECHNIQUE: Contiguous axial images were obtained from the base of the skull through the vertex without intravenous contrast.  COMPARISON:  03/09/2013  FINDINGS: There is no evidence of intracranial hemorrhage, brain edema, or other signs of acute infarction. There is no evidence of intracranial mass lesion or mass effect. No abnormal extraaxial fluid collections are identified.  Bold bilateral basal ganglia lacunae is and chronic small vessel disease are stable in appearance. Ventricles are stable in size. No skull abnormality  identified.  IMPRESSION: No acute intracranial findings.  Stable chronic small vessel disease and old bilateral basal ganglia lacunar infarcts.   Electronically Signed   By: Earle Gell M.D.   On: 09/28/2013 15:07   Mr Thoracic Spine Wo Contrast  09/28/2013   CLINICAL DATA:  Weakness in lower extremities, patient not able to walk. Hypotension.  EXAM: MRI THORACIC AND LUMBAR SPINE WITHOUT CONTRAST  TECHNIQUE: Multiplanar and multiecho pulse sequences of the thoracic and lumbar spine were obtained without intravenous contrast.  COMPARISON:  09/28/2013 CT scan  FINDINGS: MR THORACIC SPINE FINDINGS  No thoracic spine fracture or malalignment. No significant abnormal spinal cord signal is observed. Suspected mild right foraminal stenosis due to uncinate spurring at C6-7. Possible degenerative disc disease at C4-5 on the scout images, although this would be better investigated with dedicated cervical spine MRI if clinically warranted.  Despite efforts by the technologist and patient, motion artifact is present on today's exam and could not be eliminated. This reduces exam sensitivity and specificity. Additional findings at individual levels are as follows:  T1-2:  No impingement.  Small central disc protrusion.  T2-3:  No impingement.  Mild right facet arthropathy.  T3-4: No impingement. Right paracentral disc protrusion. Right facet arthropathy.  T4-5:  Unremarkable.  T5-6:  No impingement.  Mild disc bulge.  T6-7:  No impingement.  Left facet arthropathy.  T7-8:  Mild left foraminal stenosis due to facet arthropathy.  T8-9:  Unremarkable.  T9-10: No impingement. Mild disc bulge and minor right lateral recess disc protrusion.  T10-11:  No impingement.  Right lateral recess disc protrusion.  T11-12:  No impingement.  Right facet arthropathy.  MR LUMBAR SPINE FINDINGS  There is 24 degrees of dextroconvex lumbar scoliosis is measured between L5 and L1.  The lowest lumbar type non-rib-bearing vertebra Ms L5.  Despite  efforts by the technologist and patient, motion artifact is present on today's exam and could not be eliminated. This reduces exam sensitivity and specificity. The conus medullaris appears normal. Conus level: L1.  Rotary component of the scoliosis noted. Paraspinal muscular atrophy noted.  No significant vertebral marrow edema is identified. No vertebral subluxation is observed. Additional findings at individual levels are as follows:  T12-L1:  Unremarkable.  L1-2: Borderline central narrowing of the thecal sac due to disc bulge and facet arthropathy.  L2-3: Moderate central narrowing of the thecal sac, facet arthropathy, and ligamentum flavum redundancy. Borderline left subarticular lateral recess stenosis. Due to disc bulge mild displacement of the left L2 nerve in the lateral extraforaminal space due to intervertebral spurring.  L3-4: Moderate central narrowing of the thecal sac and mild bilateral subarticular lateral recess stenosis due to diffuse disc bulge and facet arthropathy.  L4-5: Moderate central narrowing of the thecal  sac with moderate right and mild left foraminal stenosis and moderate right and mild left subarticular lateral recess stenosis due to diffuse disc bulge, facet arthropathy, intervertebral spurring, and left inferior foraminal disc protrusion.  L5-S1: Moderate to prominent right foraminal stenosis and mild right subarticular lateral recess stenosis due to intervertebral and facet spurring along with disc bulge.  IMPRESSION: 1. Thoracolumbar spondylosis and degenerative disc disease cause moderate to prominent impingement at L5-S1; moderate impingement at L2-3, L3-4, and L4-5; and mild impingement at the T7-8 level as noted above. 2. Dextroconvex lumbar scoliosis. 3. No epidural hematoma or specific acute findings to explain the patient's difficulty with walking identified, although the stenosis in the lumbar spine may be contributory. No acute thoracolumbar fracture identified. No cord  abnormality in the thoracic or lumbar spine identified.   Electronically Signed   By: Sherryl Barters M.D.   On: 09/28/2013 19:31   Mr Lumbar Spine Wo Contrast  09/28/2013   CLINICAL DATA:  Weakness in lower extremities, patient not able to walk. Hypotension.  EXAM: MRI THORACIC AND LUMBAR SPINE WITHOUT CONTRAST  TECHNIQUE: Multiplanar and multiecho pulse sequences of the thoracic and lumbar spine were obtained without intravenous contrast.  COMPARISON:  09/28/2013 CT scan  FINDINGS: MR THORACIC SPINE FINDINGS  No thoracic spine fracture or malalignment. No significant abnormal spinal cord signal is observed. Suspected mild right foraminal stenosis due to uncinate spurring at C6-7. Possible degenerative disc disease at C4-5 on the scout images, although this would be better investigated with dedicated cervical spine MRI if clinically warranted.  Despite efforts by the technologist and patient, motion artifact is present on today's exam and could not be eliminated. This reduces exam sensitivity and specificity. Additional findings at individual levels are as follows:  T1-2:  No impingement.  Small central disc protrusion.  T2-3:  No impingement.  Mild right facet arthropathy.  T3-4: No impingement. Right paracentral disc protrusion. Right facet arthropathy.  T4-5:  Unremarkable.  T5-6:  No impingement.  Mild disc bulge.  T6-7:  No impingement.  Left facet arthropathy.  T7-8:  Mild left foraminal stenosis due to facet arthropathy.  T8-9:  Unremarkable.  T9-10: No impingement. Mild disc bulge and minor right lateral recess disc protrusion.  T10-11:  No impingement.  Right lateral recess disc protrusion.  T11-12:  No impingement.  Right facet arthropathy.  MR LUMBAR SPINE FINDINGS  There is 24 degrees of dextroconvex lumbar scoliosis is measured between L5 and L1.  The lowest lumbar type non-rib-bearing vertebra Ms L5.  Despite efforts by the technologist and patient, motion artifact is present on today's exam and  could not be eliminated. This reduces exam sensitivity and specificity. The conus medullaris appears normal. Conus level: L1.  Rotary component of the scoliosis noted. Paraspinal muscular atrophy noted.  No significant vertebral marrow edema is identified. No vertebral subluxation is observed. Additional findings at individual levels are as follows:  T12-L1:  Unremarkable.  L1-2: Borderline central narrowing of the thecal sac due to disc bulge and facet arthropathy.  L2-3: Moderate central narrowing of the thecal sac, facet arthropathy, and ligamentum flavum redundancy. Borderline left subarticular lateral recess stenosis. Due to disc bulge mild displacement of the left L2 nerve in the lateral extraforaminal space due to intervertebral spurring.  L3-4: Moderate central narrowing of the thecal sac and mild bilateral subarticular lateral recess stenosis due to diffuse disc bulge and facet arthropathy.  L4-5: Moderate central narrowing of the thecal sac with moderate right and mild left foraminal  stenosis and moderate right and mild left subarticular lateral recess stenosis due to diffuse disc bulge, facet arthropathy, intervertebral spurring, and left inferior foraminal disc protrusion.  L5-S1: Moderate to prominent right foraminal stenosis and mild right subarticular lateral recess stenosis due to intervertebral and facet spurring along with disc bulge.  IMPRESSION: 1. Thoracolumbar spondylosis and degenerative disc disease cause moderate to prominent impingement at L5-S1; moderate impingement at L2-3, L3-4, and L4-5; and mild impingement at the T7-8 level as noted above. 2. Dextroconvex lumbar scoliosis. 3. No epidural hematoma or specific acute findings to explain the patient's difficulty with walking identified, although the stenosis in the lumbar spine may be contributory. No acute thoracolumbar fracture identified. No cord abnormality in the thoracic or lumbar spine identified.   Electronically Signed   By: Sherryl Barters M.D.   On: 09/28/2013 19:31   Dg Chest Port 1 View  09/28/2013   CLINICAL DATA:  Hypotension.  Lower extremity weakness.  EXAM: PORTABLE CHEST - 1 VIEW  COMPARISON:  09/25/2013  FINDINGS: The heart size and mediastinal contours are within normal limits. Both lungs are clear. The visualized skeletal structures are unremarkable.  IMPRESSION: No active disease.   Electronically Signed   By: Earle Gell M.D.   On: 09/28/2013 15:50   Dg Chest Port 1 View  09/25/2013   CLINICAL DATA:  Chest pain  EXAM: PORTABLE CHEST - 1 VIEW  COMPARISON:  DG CHEST 2 VIEW dated 03/10/2013  FINDINGS: The heart size and mediastinal contours are within normal limits. Both lungs are clear. The visualized skeletal structures are unremarkable.  IMPRESSION: No active disease.   Electronically Signed   By: Kathreen Devoid   On: 09/25/2013 23:01   Ct Angio Chest Aortic Dissect W &/or W/o  09/28/2013   CLINICAL DATA:  Hypotension. Lower extremity paralysis. Clinical suspicion for aortic dissection.  EXAM: CT ANGIOGRAPHY CHEST, ABDOMEN AND PELVIS  TECHNIQUE: Multidetector CT imaging through the chest, abdomen and pelvis was performed using the standard protocol during bolus administration of intravenous contrast. Multiplanar reconstructed images and MIPs were obtained and reviewed to evaluate the vascular anatomy.  CONTRAST:  114mL OMNIPAQUE IOHEXOL 350 MG/ML SOLN  COMPARISON:  Chest CTA on 05/31/12  FINDINGS: CTA CHEST FINDINGS  No evidence of thoracic aortic dissection or aneurysm. No evidence of mediastinal hematoma or mass. Pulmonary arteries are well opacified, and no emboli identified.  No evidence of pleural or pericardial effusion. No evidence of pulmonary infiltrate or mass. No evidence of central endobronchial obstruction. No evidence of thoracic lymphadenopathy. No suspicious bone lesions identified.  Review of the MIP images confirms the above findings.  CTA ABDOMEN AND PELVIS FINDINGS  No evidence of abdominal aortic  aneurysm or dissection. No evidence of iliac artery aneurysm or dissection. No evidence of retroperitoneal hemorrhage.  Hepatic steatosis again noted, however no liver masses are identified. The spleen, pancreas, adrenal glands, and kidneys are normal in appearance.  Prior hysterectomy noted. Adnexal regions are unremarkable. No soft tissue masses or lymphadenopathy identified. No evidence of inflammatory process or abnormal fluid collections. No evidence of dilated bowel loops.  Review of the MIP images confirms the above findings.  IMPRESSION: Negative. No evidence of aortic aneurysm, dissection, or other acute findings.  Stable hepatic steatosis.   Electronically Signed   By: Earle Gell M.D.   On: 09/28/2013 16:04   Ct Angio Abd/pel W/ And/or W/o  09/28/2013   CLINICAL DATA:  Hypotension. Lower extremity paralysis. Clinical suspicion for aortic dissection.  EXAM: CT ANGIOGRAPHY CHEST, ABDOMEN AND PELVIS  TECHNIQUE: Multidetector CT imaging through the chest, abdomen and pelvis was performed using the standard protocol during bolus administration of intravenous contrast. Multiplanar reconstructed images and MIPs were obtained and reviewed to evaluate the vascular anatomy.  CONTRAST:  175mL OMNIPAQUE IOHEXOL 350 MG/ML SOLN  COMPARISON:  Chest CTA on 05/31/12  FINDINGS: CTA CHEST FINDINGS  No evidence of thoracic aortic dissection or aneurysm. No evidence of mediastinal hematoma or mass. Pulmonary arteries are well opacified, and no emboli identified.  No evidence of pleural or pericardial effusion. No evidence of pulmonary infiltrate or mass. No evidence of central endobronchial obstruction. No evidence of thoracic lymphadenopathy. No suspicious bone lesions identified.  Review of the MIP images confirms the above findings.  CTA ABDOMEN AND PELVIS FINDINGS  No evidence of abdominal aortic aneurysm or dissection. No evidence of iliac artery aneurysm or dissection. No evidence of retroperitoneal hemorrhage.   Hepatic steatosis again noted, however no liver masses are identified. The spleen, pancreas, adrenal glands, and kidneys are normal in appearance.  Prior hysterectomy noted. Adnexal regions are unremarkable. No soft tissue masses or lymphadenopathy identified. No evidence of inflammatory process or abnormal fluid collections. No evidence of dilated bowel loops.  Review of the MIP images confirms the above findings.  IMPRESSION: Negative. No evidence of aortic aneurysm, dissection, or other acute findings.  Stable hepatic steatosis.   Electronically Signed   By: Earle Gell M.D.   On: 09/28/2013 16:04    2D Echo: None  Cardiac Cath: None  Admission HPI: Chief Complaint: Weakness of lower extremities and dizziness   History of Present Illness: 63 year old female with past medical history of DM, depression, Grade 1 diastolic CHF, hypertension, hypertriglyceridemia, TIA presented today with complaints of generalized weakness and dizziness, which started yesterday. Patient states all week has been feeling sick, with vomiting- nonbloody and diarrhea- also nonbloody, that started on monday 4-5 episodes of each per day, but this gradually resolved by Wednesday. Pt say she hadnt eaten anything since Monday because of her vomiting and malaise. Patient endorses having a cough earlier in the week, which was not productive, but which has improved in severity, No fever or chills, no falls, no dysphagia, slurred speech or facial droop, no chest pain presently- but had complaints of chest pain earlier in the week, which she said was because of her cough- pain was around her chest. Patient has not taken her medications as she has not been eating. She came to the ED on the 15th of this month with complaints of nausea, vomiting, diarrhea and chest pain, was given a prescription for albuterol and on Zofran with instructions to followup with PCP.   Physical Exam:  Blood pressure 96/56, pulse 71, temperature 98.8 F (37.1  C), temperature source Oral, resp. rate 18, weight 210 lb (95.255 kg), SpO2 97.00%.  GENERAL- alert, co-operative, appears as stated age, not in any distress.  HEENT- Atraumatic, normocephalic, PERRL, EOMI, oral mucosa appears dry, no cervical LN enlargement, thyroid does not appear enlarged.  CARDIAC- RRR, no murmurs, rubs or gallops.  RESP- Moving equal volumes of air, with expiratory wheezes heard bilaterally.  ABDOMEN- Soft, nontender, no palpable masses or organomegaly, bowel sounds present.  NEURO- Cr N 2-12 intact, strenght Upper extremities- 5/5, Lower Extremities- 3/5 equal and present in all extremities, sensation intact. DTRs- 2+ bilaterally.  EXTREMITIES- pulse 2+, symmetric, no pedal edema.  SKIN- Warm, dry, telangiectasias on her cheeks.Marland Kitchen  PSYCH- Normal mood and affect, appropriate  thought content and speech.   Hospital Course by problem list:   Hypotension- Pt presented with generalised weakness, which was marked in her lower extremities,  with hx of nausea, vomiting and diarrhea for 3 days and had not eaten anything in 3 days. Blood pressure reported by EMS was 36U systolic, was 44/03 on arrival in the Ed.  Most likely etiology was severe dehydration from vomiting and diarrhea, with reduced by oral intake. No suspicion of cardiac aetiology as I stat trop and troponin x1 negative, EKG without concerning abnormalities. No concern for infectious etiology - as no fever or leukocytosis. Weakness and dizziness was most likely due to hypotension. As pt presented with weakness more marked in her lower extremities, CT abdomen and MRI thoracic and lumber spine were done, which were negative for any acute abnormalities. Head CT negative for any acute intracranial findings. Neurology was also consulted, they felt that psychophysiologic factors were contributing to patient's symptomatology. Recommendation was for physical therapy. Pt received 3 L of normal saline bolus and maintained at 100cc/hr  with improvement in blood pressure to Systolic 474Q. On discharge patients blood pressure had normalized, such that we had to restart some of her blood pressure pills prior to discharge. She was discharged with close follow up in clinic.  AKI - On admission, BUN 34, Cr- 1.56, Baseline Cr- 0.75. Was pre-renal, and so due to severe dehydration. Anion gap was also increased-18, with acidosis, bicarb of 16 , likely due to starvation ketosis and kidney injury. With hydration, this normalized and on discharge- BUN- 19, Cr- 0.72, calculated anion gap- 12. Which was her baseline.   DM2- Home meds- Metformin- 500mg  TID. Pt was placed on SSI- Moderate. Home meds were resumed on discharge.   COPD- Pt had a non productive cough, but said this was resolving- she had an upper respiratory tract infection- a week prior to presentation, without shortness of breath, but some wheezing was appreciated on exam. Was given steroids- this was discontinued as no concern for exacebation and duonebs. Was discharged home to continue home albuterol inhaler.   HTN- As patient was initially hypotensive on admission, Homemeds- Amlodipine- 5mg  , metoprolol 50mg  and lisinopril- 40mg  were held. On discharged pt was instructed to take 12.5mg  of her Metoprolol, and continue amlodipine 5mg . Her antihypertensive regimen can be continued as indicated after follow up visit in clinic.   CHF Diastolic, stage 1- Prior documentation of stage 2 on ECHO- 03/10/2014- 55-60%. Pt was cautiously hydrated, weights were checked daily,with strict monitoring of intake and out-pt. On discharge , there was no evidence of fluid overload.   Tobacco Use- Smokes about 1/2 a pack per day, and has smoked since She was 63 years old.  Discharge Vitals:   BP 131/64  Pulse 76  Temp(Src) 97.4 F (36.3 C) (Oral)  Resp 18  Ht 5\' 6"  (1.676 m)  Wt 204 lb 9.4 oz (92.8 kg)  BMI 33.04 kg/m2  SpO2 96%  Discharge Labs:  None.  Signed: Jenetta Downer,  MD 09/30/2013, 12:30 PM   Time Spent on Discharge:  35 minutes Services Ordered on Discharge: None Equipment Ordered on Discharge: None

## 2013-09-30 NOTE — Evaluation (Signed)
Physical Therapy Evaluation Patient Details Name: [name] MRN: [id number] DOB: [birth year] Today's Date: 09/30/2013   History of Present Illness  63 year old female with past medical history of DM, depression, Grade 1 diastolic CHF, hypertension, hypertriglyceridemia, TIA presented today with complaints of generalized weakness and dizziness, which started yesterday. Patient states all week has been feeling sick, with vomiting- nonbloody and diarrhea- also nonbloody, that started on monday 4-5 episodes of each per day, but this gradually resolved by Wednesday. Pt say she hadnt eaten anything since Monday because of her vomiting and malaise. Patient endorses having a cough earlier in the week, which was not productive, but which has improved in severity; Decr BP in setting of dehydration; Neuro consulted in ED as she describes weakness in Bil LEs  Clinical Impression  Pt admitted with hypotension and weakness in B LE. Pt currently with functional limitations due to the deficits listed below (see PT Problem List). Pt will benefit from skilled PT to increase their independence and safety with mobility to allow discharge to the venue listed below. Pt near baseline level.      Follow Up Recommendations No PT follow up    Equipment Recommendations  None recommended by PT    Recommendations for Other Services       Precautions / Restrictions Precautions Precautions: Fall      Mobility  Bed Mobility Overal bed mobility: Independent                Transfers Overall transfer level: Independent                  Ambulation/Gait Ambulation/Gait assistance: Supervision Ambulation Distance (Feet): 200 Feet Assistive device: None (occasional use of IV pole or rail) Gait Pattern/deviations: Step-through pattern Gait velocity: WNL   General Gait Details: slight unsteadiness but no LOB,  o2 sats 94% on RA.  Stairs            Wheelchair Mobility    Modified Rankin  (Stroke Patients Only)       Balance Overall balance assessment: No apparent balance deficits (not formally assessed)                                           Pertinent Vitals/Pain Denies pain, o2 94% on room air with gait, HR 87-105    Home Living Family/patient expects to be discharged to:: Private residence Living Arrangements: Children   Type of Home: House Home Access: Level entry     Home Layout: One level Home Equipment: None      Prior Function Level of Independence: Independent               Hand Dominance        Extremity/Trunk Assessment   Upper Extremity Assessment: Overall WFL for tasks assessed           Lower Extremity Assessment: Overall WFL for tasks assessed         Communication   Communication: No difficulties  Cognition Arousal/Alertness: Awake/alert Behavior During Therapy: WFL for tasks assessed/performed Overall Cognitive Status: Within Functional Limits for tasks assessed                      General Comments      Exercises        Assessment/Plan    PT Assessment Patient needs continued PT services  PT Diagnosis Difficulty  walking   PT Problem List Decreased mobility  PT Treatment Interventions Gait training   PT Goals (Current goals can be found in the Care Plan section) Acute Rehab PT Goals Patient Stated Goal: go home  PT Goal Formulation: With patient Time For Goal Achievement: 10/07/13 Potential to Achieve Goals: Good    Frequency Min 3X/week   Barriers to discharge        Co-evaluation               End of Session Equipment Utilized During Treatment: Gait belt Activity Tolerance: Patient tolerated treatment well Patient left: in chair Nurse Communication: Mobility status (o2 sats)    Functional Limitation: Mobility: Walking and moving around Mobility: Walking and Moving Around Current Status (M0947): At least 1 percent but less than 20 percent impaired,  limited or restricted Mobility: Walking and Moving Around Goal Status [phone]): 0 percent impaired, limited or restricted    Time: 0855-0915 PT Time Calculation (min): 20 min   Charges:   PT Evaluation $Initial PT Evaluation Tier I: 1 Procedure PT Treatments $Gait Training: 8-22 mins   PT G Codes:     Functional Limitation: Mobility: Walking and moving around    [employer] 09/30/2013, 9:59 AM

## 2013-09-30 NOTE — Progress Notes (Signed)
09/30/13 [id number] Pt.is A/Ox4 and is ambulates independently. She is on room air. She had c/o pain and no signs of distress. Discharge paperwork was discussed and IV taken out. She was transported by wheelchair accompanied NT.

## 2013-09-30 NOTE — Progress Notes (Signed)
Day 2 of stay      Patient name: [name]  Medical record number: [id number]  Date of birth: 1950-07-04  [name] is here for hypotensive episode secondary to GI losses, presenting with weakness. She has since been treated with supportive measures and ruled out of AMI, stroke, cord compression. She has been seen by neurology for the same this visit. She feels much improved today. Her vitals have been stable. I met with her this morning and evaluated her. She complains of belching and reflux. Her exam is significant for normotensive blood pressure, alert and oriented patient, no gross focal neurological deficits, lung and cardiac exam within normal limits.   I have discussed the plan of care with my IM team and read Dr. Talmadge Coventry ntoe from today. I agree with her documentation. In addition to the given plan, I have asked my team to give the patient a trial of PPI. The patient agrees.     [name] 09/30/2013, 11:43 AM.

## 2013-09-30 NOTE — Progress Notes (Signed)
Subjective: Doing much better. Ambulating in hallway without weakness. No complaints today, ready to go home. No dizziness. Tolerating oral diet.  Objective: Vital signs in last 24 hours: Filed Vitals:   09/30/13 0445 09/30/13 0523 09/30/13 0930 09/30/13 0937  BP: 131/96 148/68 131/64   Pulse: 65  76   Temp: 98.2 F (36.8 C)  97.4 F (36.3 C)   TempSrc: Oral  Oral   Resp: 18  18   Height:      Weight: 204 lb 9.4 oz (92.8 kg)     SpO2: 94%  90% 96%   Weight change: -5 lb 6.6 oz (-2.455 kg)  Intake/Output Summary (Last 24 hours) at 09/30/13 1043 Last data filed at 09/30/13 0933  Gross per 24 hour  Intake   1553 ml  Output      0 ml  Net   1553 ml   GENERAL- Appears as stated age, not in any distress.  HEENT- Atraumatic, normocephalic, PERRL, EOMI, oral mucosa appears moist.  CARDIAC- Regular rate and rhythm, no murmurs, rubs or gallops.  RESP- Normal work of breathing, with expiratory wheezes heard bilaterally.  ABDOMEN- Soft, nontender, no palpable masses or organomegaly, bowel sounds present.  NEURO- No obvious Cr N abnormality, strenght- 5/5 in all extremities.  EXTREMITIES- pulse 2+, symmetric, no pedal edema.  SKIN- Warm, dry, telangiectasias on her cheeks.   Lab Results: Basic Metabolic Panel:  Recent Labs Lab 09/28/13 1507 09/29/13 0502  NA 135* 135*  K 3.5* 4.1  CL 100 105  CO2 17* 18*  GLUCOSE 92 219*  BUN 34* 19  CREATININE 1.56* 0.72  CALCIUM 8.7 8.9   Liver Function Tests:  Recent Labs Lab 09/28/13 1507  AST 22  ALT 29  ALKPHOS 50  BILITOT <0.2*  PROT 5.9*  ALBUMIN 2.8*   CBC:  Recent Labs Lab 09/28/13 1507 09/29/13 0502  WBC 7.4 4.5  NEUTROABS 4.1  --   HGB 12.5 13.4  HCT 37.8 39.9  MCV 94.7 94.5  PLT 185 158   Cardiac Enzymes:  Recent Labs Lab 09/28/13 1507 09/29/13 0050 09/29/13 0502  TROPONINI <0.30 <0.30 <0.30   BNP:  Recent Labs Lab 09/28/13 1507  PROBNP 174.2*   CBG:  Recent Labs Lab 09/29/13 1222  09/29/13 1551 09/29/13 1932 09/30/13 0005 09/30/13 0443 09/30/13 0834  GLUCAP 267* 322* 115* 168* 122* 127*   Coagulation:  Recent Labs Lab 09/28/13 1507  LABPROT 12.5  INR 0.95    Urinalysis:  Recent Labs Lab 09/28/13 1706  COLORURINE YELLOW  LABSPEC 1.036*  PHURINE 5.0  GLUCOSEU 250*  HGBUR NEGATIVE  BILIRUBINUR SMALL*  KETONESUR NEGATIVE  PROTEINUR 30*  UROBILINOGEN 0.2  NITRITE NEGATIVE  LEUKOCYTESUR NEGATIVE    Micro Results: Recent Results (from the past 240 hour(s))  URINE CULTURE     Status: None   Collection Time    09/28/13  5:06 PM      Result Value Ref Range Status   Specimen Description URINE, CATHETERIZED   Final   Special Requests NONE   Final   Culture  Setup Time     Final   Value: 09/29/2013 02:50     Performed at [facility] Count     Final   Value: NO GROWTH     Performed at [facility]   Culture     Final   Value: NO GROWTH     Performed at [facility]   Report Status 09/30/2013 FINAL  Final  MRSA PCR SCREENING     Status: None   Collection Time    09/28/13  7:42 PM      Result Value Ref Range Status   MRSA by PCR NEGATIVE  NEGATIVE Final   Comment:            The GeneXpert MRSA Assay (FDA     approved for NASAL specimens     only), is one component of a     comprehensive MRSA colonization     surveillance program. It is not     intended to diagnose MRSA     infection nor to guide or     monitor treatment for     MRSA infections.   Studies/Results: Ct Head Wo Contrast  09/28/2013   CLINICAL DATA:  Dizziness. Syncopal episode. Lower extremity weakness. Hypotension.  IMPRESSION: No acute intracranial findings.  Stable chronic small vessel disease and old bilateral basal ganglia lacunar infarcts.   Electronically Signed   By: Earle Gell M.D.   On: 09/28/2013 15:07   Mr Thoracic Spine Wo Contrast  09/28/2013   CLINICAL DATA:  Weakness in lower extremities, patient not able to walk.  Hypotension.  IMPRESSION: 1. Thoracolumbar spondylosis and degenerative disc disease cause moderate to prominent impingement at L5-S1; moderate impingement at L2-3, L3-4, and L4-5; and mild impingement at the T7-8 level as noted above. 2. Dextroconvex lumbar scoliosis. 3. No epidural hematoma or specific acute findings to explain the patient's difficulty with walking identified, although the stenosis in the lumbar spine may be contributory. No acute thoracolumbar fracture identified. No cord abnormality in the thoracic or lumbar spine identified.   Electronically Signed   By: Sherryl Barters M.D.   On: 09/28/2013 19:31   Mr Lumbar Spine Wo Contrast  09/28/2013   CLINICAL DATA:  Weakness in lower extremities, patient not able to walk. Hypotension.   IMPRESSION: 1. Thoracolumbar spondylosis and degenerative disc disease cause moderate to prominent impingement at L5-S1; moderate impingement at L2-3, L3-4, and L4-5; and mild impingement at the T7-8 level as noted above. 2. Dextroconvex lumbar scoliosis. 3. No epidural hematoma or specific acute findings to explain the patient's difficulty with walking identified, although the stenosis in the lumbar spine may be contributory. No acute thoracolumbar fracture identified. No cord abnormality in the thoracic or lumbar spine identified.   Electronically Signed   By: Sherryl Barters M.D.   On: 09/28/2013 19:31   Dg Chest Port 1 View  09/28/2013   CLINICAL DATA:  Hypotension.  Lower extremity weakness.  EXAM: PORTABLE CHEST - 1 VIEW  COMPARISON:  09/25/2013  FINDINGS: The heart size and mediastinal contours are within normal limits. Both lungs are clear. The visualized skeletal structures are unremarkable.  IMPRESSION: No active disease.   Electronically Signed   By: Earle Gell M.D.   On: 09/28/2013 15:50   Ct Angio Chest Aortic Dissect W &/or W/o  09/28/2013   CLINICAL DATA:  Hypotension. Lower extremity paralysis. Clinical suspicion for aortic dissection.    IMPRESSION: Negative. No evidence of aortic aneurysm, dissection, or other acute findings.  Stable hepatic steatosis.   Electronically Signed   By: Earle Gell M.D.   On: 09/28/2013 16:04   Ct Angio Abd/pel W/ And/or W/o  09/28/2013   CLINICAL DATA:  Hypotension. Lower extremity paralysis. IMPRESSION: Negative. No evidence of aortic aneurysm, dissection, or other acute findings.  Stable hepatic steatosis.   Electronically Signed   By: Sharrie Rothman.D.  On: 09/28/2013 16:04   Medications: I have reviewed the patient's current medications. Scheduled Meds: . amLODipine  5 mg Oral Daily  . aspirin  81 mg Oral Daily  . DULoxetine  60 mg Oral Daily  . heparin  5,000 Units Subcutaneous 3 times per day  . insulin aspart  0-15 Units Subcutaneous 6 times per day  . ipratropium-albuterol  3 mL Nebulization BID  . metoprolol  12.5 mg Oral BID  . potassium chloride  40 mEq Oral Once  . predniSONE  40 mg Oral Q breakfast  . sodium chloride  3 mL Intravenous Q12H   Continuous Infusions: . sodium chloride 100 mL/hr at 09/28/13 2154   PRN Meds:.HYDROcodone-acetaminophen, ipratropium-albuterol, ondansetron (ZOFRAN) IV, ondansetron Assessment/Plan:  Hypotension- Likely due to GI losses and poor Po intake. - Cont IV n/s at 100cc/hr  - Zofran 4mg  Q4H  - Carb modified diet.  - Discharge home today - Follow up appoitment with [place].  AKI - Pre-renal picture, BUN 34, Cr- 1.56, Baseline Cr- 0.75. Likely due to severe dehydration. Has improved, and back to baseline on Labs today- Cr- 0.7  - D/c IVF N/s 100cc/hr.   Increased Anion gap (Resolved)- Calculated at 18 on admission. Anion gap - 09/29/2013- 12. Was most likely due to starvation ketosis, possible hypotension contributing and Acute kidney injury. Lactic acid WNL.    DM2- Home meds- Metformin- 500mg  TID.  - SSI   COPD- has a cough that is resolving. No SOB.  - Prednisone 40mg  tablet daily.  - Duonebs- Q4H.   HTN- Homemeds- Amlodipine- 5mg  daily,  metoprolol 50mg  and lisinopril- 40mg  daily. BP this am- 131/64.  - Gradually reintroduce BP meds, Metop and amlodipine. - Can reintroduce her Bp meds as indicated as an outpatient.  CHF Diastolic, stage 1- prior documentation of stage 2 on ECHO- 03/10/2014- 55-60%.  - Gentle fluid hydration.  - Daily weights   Dispo: Disposition is deferred at this time, awaiting improvement of current medical problems.    The patient does have a current PCP Rebecca Eaton, MD) and does need an [facility] hospital follow-up appointment after discharge.  The patient does have transportation limitations that hinder transportation to clinic appointments.  .Services Needed at time of discharge: Y = Yes, Blank = No PT:   OT:   RN:   Equipment:   Other:     LOS: 2 days   Jenetta Downer, MD 09/30/2013, 10:43 AM

## 2013-09-30 NOTE — Discharge Instructions (Signed)
Please remember to take 12.5mg  of your metoprolol. That is a fourth of your usual dose. Your blood pressure meds will gradually be reintroduced when you come to clinic depending on your blood pressure.  Also we prescribed protonix, a medication to help with your reflux.

## 2013-10-01 NOTE — ED Provider Notes (Signed)
I saw and evaluated the patient, reviewed the resident's note and I agree with the findings and plan.   EKG Interpretation   Date/Time:  Wednesday September 25 2013 22:19:35 EDT Ventricular Rate:  117 PR Interval:  144 QRS Duration: 74 QT Interval:  312 QTC Calculation: 435 R Axis:   51 Text Interpretation:  Sinus tachycardia Biatrial enlargement Repol abnrm,  severe global ischemia (LM/MVD) ED PHYSICIAN INTERPRETATION AVAILABLE IN  [facility] Confirmed by TEST, Record ([id number]) on 09/27/2013 7:06:52 AM     62yF with n/v and sob. W/u fairly unremarkable. EKG with repol abnormalities, but not significantly changed from prior. Negative stress two months ago. Symptoms atypical for ACS. Trop normal. Wheezing on exam in pt with known hx of COPD. Improved after neb. Suspect copd exacerbation. Possible viral illness as etiology of n/v. Minimal epigastric tenderness on exam w/o rebound/guarding. Plan continued symptomatic tx/supportive care.   Virgel Manifold, MD 10/01/13 [id number]

## 2013-10-05 NOTE — Discharge Summary (Signed)
Reviewed, agree with documentation.  

## 2013-10-07 ENCOUNTER — Encounter: Payer: BC Managed Care – PPO | Admitting: Internal Medicine

## 2013-10-15 ENCOUNTER — Encounter: Payer: Self-pay | Admitting: Internal Medicine

## 2013-10-15 ENCOUNTER — Ambulatory Visit (INDEPENDENT_AMBULATORY_CARE_PROVIDER_SITE_OTHER): Payer: BC Managed Care – PPO | Admitting: Internal Medicine

## 2013-10-15 VITALS — BP 149/93 | HR 89 | Temp 98.4°F | Wt 208.2 lb

## 2013-10-15 DIAGNOSIS — E1142 Type 2 diabetes mellitus with diabetic polyneuropathy: Secondary | ICD-10-CM

## 2013-10-15 DIAGNOSIS — E1149 Type 2 diabetes mellitus with other diabetic neurological complication: Secondary | ICD-10-CM

## 2013-10-15 DIAGNOSIS — E1140 Type 2 diabetes mellitus with diabetic neuropathy, unspecified: Secondary | ICD-10-CM

## 2013-10-15 DIAGNOSIS — IMO0002 Reserved for concepts with insufficient information to code with codable children: Secondary | ICD-10-CM

## 2013-10-15 DIAGNOSIS — E1165 Type 2 diabetes mellitus with hyperglycemia: Principal | ICD-10-CM

## 2013-10-15 DIAGNOSIS — I10 Essential (primary) hypertension: Secondary | ICD-10-CM

## 2013-10-15 NOTE — Assessment & Plan Note (Signed)
As I work up her generalized weakness, which may be d/t depression, I am going to have to change her medications.  For the next week, I've asked that she d/c metoprolol (as this may exacerbate lethargy and sx are after taking this medication, she is taking 1/4 of a tablet, 12.5mg ?) - she does have history of heart failure, so if symptoms are unchanged, it will be important to restart medication. I have asked that she continue amlodipine 5mg  daily, and restart lisinopril 20mg  daily. Also, BMET at next visit in 1 week.

## 2013-10-15 NOTE — Patient Instructions (Addendum)
For one week, stop taking metoprolol and start lisinopril 20mg  daily (half a tablet).  Continue amlodipine 5mg  daily.  We will check you kidney function in 1 week.  Also for one week, please take glipizide 5mg  only (NOT 10mg ).  If in 3-4 days, your symptoms persist, stop taking glipizide all together.  Try not to drink water after 8pm.   Please be sure to bring all of your medications with you to every visit.  Should you have any new or worsening symptoms, please be sure to call the clinic at [phone].

## 2013-10-15 NOTE — Progress Notes (Signed)
Subjective:   Patient ID: [name] female   DOB: 1951/05/20 63 y.o.   MRN: [id number]  Chief Complaint  Patient presents with  . HFU  . Fatigue  . Depression    HPI: [name] is a 63 y.o. woman with history of DM, HTN, dCHF, major depression, h/o TIA who presents for HFU.  She was hospitalized 4/18-4/20 due to generalized weakness/dizziness found to be hypotensive (SBP 50s in EMS)  Per d/c summary: consider reintro of BP meds, at discharge they restarted amlodipine & metop but held lisinopril & HCTZ; AKI resolved at discharge.   Since hospital discharge, she has felt exhausted all the time.  When she takes her meds, she can't get up and move around an hour after.  Hasn't checked CBG when this happens.  Hasn't been checking CBG because unable to afford meter, but as of July 1st will have medicaid with [employer].  All she wants to do is sleep.  She feels very depressed - cries easily, lost her husband in 2013 and much has been going on since then.   Poor appetite, tries to eat twice daily, cereal in the morning, if she has bread, makes egg sandwhich, tries to eat dinner.  Lives with daughter, grand daughter, grand son and future grand son in [place].  Family won't leave her alone either. Not sleeping well at night, can't stay asleep due to polyuria with urge incontinence.  Doesn't sleep until 3a, but does day time naps.  Review of Systems: Constitutional: Denies fever, chills, diaphoresis, +hot flashes HEENT: Denies photophobia, eye pain, redness, hearing loss, ear pain, congestion, sore throat, rhinorrhea, sneezing, mouth sores, trouble swallowing, neck pain, neck stiffness and tinnitus.  Respiratory: Denies DOE, cough, chest tightness, and wheezing. +SOB Cardiovascular: Denies chest pain, palpitations and leg swelling.  Gastrointestinal: Denies nausea, vomiting, abdominal pain, diarrhea, constipation,blood in stool and abdominal distention.  Genitourinary: Denies dysuria,  urgency, hematuria, flank pain and difficulty urinating.  Skin: Denies pallor, rash and wound.  Neurological: Denies dizziness, seizures, syncope, weakness, lightheadedness, numbness and headaches.     Past Medical History  Diagnosis Date  . Hypertension   . Diabetes mellitus without complication     diagnosed around 2010, only ever on metformin  . Chronic pain     neck pain, headache, neuropathy  . Hypertriglyceridemia   . Neuromuscular disorder   . COPD (chronic obstructive pulmonary disease)   . CHF (congestive heart failure)   . Depression   . Puncture wound of foot, right 05/17/2012    Tetanus shot 3 yrs ago at [facility] in [place], per pt report    Current Outpatient Prescriptions  Medication Sig Dispense Refill  . albuterol (PROVENTIL HFA;VENTOLIN HFA) 108 (90 BASE) MCG/ACT inhaler Inhale 1-2 puffs into the lungs every 6 (six) hours as needed for wheezing or shortness of breath.  1 Inhaler  0  . amLODipine (NORVASC) 5 MG tablet Take 5 mg by mouth every evening.       Marland Kitchen aspirin 81 MG tablet Take 81 mg by mouth every morning.       . DULoxetine (CYMBALTA) 60 MG capsule Take 60 mg by mouth every morning.       . gabapentin (NEURONTIN) 600 MG tablet Take 600 mg by mouth 3 (three) times daily.      Marland Kitchen glipiZIDE (GLUCOTROL XL) 5 MG 24 hr tablet Take 5 mg by mouth daily with supper.       . metFORMIN (GLUCOPHAGE) 500 MG tablet  Take 500 mg by mouth 2 (two) times daily.       . metoprolol (LOPRESSOR) 50 MG tablet Take 50 mg by mouth 2 (two) times daily.      . ondansetron (ZOFRAN-ODT) 4 MG disintegrating tablet Take 4 mg by mouth every 8 (eight) hours as needed for nausea or vomiting.      . pantoprazole (PROTONIX) 20 MG tablet Take 1 tablet (20 mg total) by mouth daily.  30 tablet  2   No current facility-administered medications for this visit.   Family History  Problem Relation Age of Onset  . Hyperlipidemia Mother   . Heart attack Father 13  . Hypertension Father   .  Cancer Paternal Grandfather     Lung cancer  . Cancer Maternal Grandmother   . Heart attack Maternal Grandmother    History   Social History  . Marital Status: Widowed    Spouse Name: N/A    Number of Children: N/A  . Years of Education: N/A   Social History Main Topics  . Smoking status: Current Every Day Smoker -- 0.50 packs/day for 41 years    Types: Cigarettes  . Smokeless tobacco: Never Used     Comment: Trying to cut back.  . Alcohol Use: No  . Drug Use: No  . Sexual Activity: None   Other Topics Concern  . None   Social History Narrative   Moved to [facility] from [place] 04/26/12, shortly after the death of her husband, to live with her daughter.  She notes significant life stress related to her 30 year-old grand-daughter with bipolar disorder.  She has previously worked as a Occupational psychologist.    Objective:  Physical Exam: Filed Vitals:   10/15/13 1021  BP: 149/93  Pulse: 89  Temp: 98.4 F (36.9 C)  TempSrc: Oral  Weight: 208 lb 3.2 oz (94.439 kg)  SpO2: 95%   General: pleasant, appears as stated age, easily cries HEENT: PERRL, EOMI, no scleral icterus Cardiac: RRR, no rubs, murmurs or gallops Pulm: clear to auscultation bilaterally, moving normal volumes of air Abd: soft, nontender, nondistended, BS present Ext: warm and well perfused, no pedal edema Neuro: alert and oriented X3, cranial nerves II-XII grossly intact, 5/5 b/l UE & LE strength  Assessment & Plan:  Case and care discussed with Dr. Marinda Elk.  Please see problem oriented charting for further details. Patient to return in 1 week for depression/HTN/DM follow up.

## 2013-10-15 NOTE — Assessment & Plan Note (Addendum)
Patient's last A1c 8.2, taking glipizide 10mg  daily and metformin 500 mg bid.  She does not check her blood sugar and has symptoms of generalized weakness after taking AM meds (including metformin, she takes glipizide at night), so cannot rule out hypoglycemia.  As I try to parse through her weakness, I am temporarily asking her to decrease glipizide to 5mg  daily for 3-4 days, and if symptoms persist, then discontinue glipizide all together.  Continue metformin 500mg  bid.  Hopeful to get meter in early July after medicaid added to [facility].

## 2013-10-15 NOTE — Addendum Note (Signed)
Addended by: Hulan Fray on: 10/15/2013 04:33 PM   Modules accepted: Orders

## 2013-10-17 NOTE — Progress Notes (Signed)
Case discussed with Dr. Sharda soon after the resident saw the patient.  We reviewed the resident's history and exam and pertinent patient test results.  I agree with the assessment, diagnosis, and plan of care documented in the resident's note. 

## 2013-10-22 ENCOUNTER — Ambulatory Visit: Payer: BC Managed Care – PPO | Admitting: Internal Medicine

## 2013-11-05 ENCOUNTER — Encounter: Payer: Self-pay | Admitting: Internal Medicine

## 2013-11-05 ENCOUNTER — Ambulatory Visit: Payer: BC Managed Care – PPO | Admitting: Internal Medicine

## 2013-12-02 ENCOUNTER — Encounter: Payer: BC Managed Care – PPO | Admitting: Internal Medicine

## 2013-12-11 ENCOUNTER — Ambulatory Visit (INDEPENDENT_AMBULATORY_CARE_PROVIDER_SITE_OTHER): Payer: BC Managed Care – PPO | Admitting: Internal Medicine

## 2013-12-11 ENCOUNTER — Encounter: Payer: Self-pay | Admitting: Internal Medicine

## 2013-12-11 ENCOUNTER — Telehealth: Payer: Self-pay | Admitting: Dietician

## 2013-12-11 VITALS — BP 151/101 | HR 89 | Temp 97.0°F | Ht 66.0 in | Wt 193.7 lb

## 2013-12-11 DIAGNOSIS — I959 Hypotension, unspecified: Secondary | ICD-10-CM | POA: Diagnosis not present

## 2013-12-11 DIAGNOSIS — R29898 Other symptoms and signs involving the musculoskeletal system: Secondary | ICD-10-CM | POA: Diagnosis not present

## 2013-12-11 DIAGNOSIS — F17200 Nicotine dependence, unspecified, uncomplicated: Secondary | ICD-10-CM | POA: Diagnosis not present

## 2013-12-11 DIAGNOSIS — E1149 Type 2 diabetes mellitus with other diabetic neurological complication: Secondary | ICD-10-CM | POA: Diagnosis not present

## 2013-12-11 DIAGNOSIS — E119 Type 2 diabetes mellitus without complications: Secondary | ICD-10-CM | POA: Diagnosis not present

## 2013-12-11 DIAGNOSIS — Z0000 Encounter for general adult medical examination without abnormal findings: Secondary | ICD-10-CM

## 2013-12-11 DIAGNOSIS — R269 Unspecified abnormalities of gait and mobility: Secondary | ICD-10-CM | POA: Diagnosis not present

## 2013-12-11 DIAGNOSIS — E1140 Type 2 diabetes mellitus with diabetic neuropathy, unspecified: Secondary | ICD-10-CM

## 2013-12-11 DIAGNOSIS — R51 Headache: Secondary | ICD-10-CM | POA: Diagnosis not present

## 2013-12-11 DIAGNOSIS — I509 Heart failure, unspecified: Secondary | ICD-10-CM | POA: Diagnosis not present

## 2013-12-11 DIAGNOSIS — I10 Essential (primary) hypertension: Secondary | ICD-10-CM

## 2013-12-11 DIAGNOSIS — I5030 Unspecified diastolic (congestive) heart failure: Secondary | ICD-10-CM | POA: Diagnosis not present

## 2013-12-11 DIAGNOSIS — N179 Acute kidney failure, unspecified: Secondary | ICD-10-CM | POA: Diagnosis not present

## 2013-12-11 DIAGNOSIS — F329 Major depressive disorder, single episode, unspecified: Secondary | ICD-10-CM

## 2013-12-11 DIAGNOSIS — R0902 Hypoxemia: Secondary | ICD-10-CM | POA: Diagnosis not present

## 2013-12-11 DIAGNOSIS — E1165 Type 2 diabetes mellitus with hyperglycemia: Principal | ICD-10-CM

## 2013-12-11 DIAGNOSIS — IMO0002 Reserved for concepts with insufficient information to code with codable children: Secondary | ICD-10-CM

## 2013-12-11 DIAGNOSIS — I159 Secondary hypertension, unspecified: Secondary | ICD-10-CM

## 2013-12-11 DIAGNOSIS — K219 Gastro-esophageal reflux disease without esophagitis: Secondary | ICD-10-CM

## 2013-12-11 DIAGNOSIS — J449 Chronic obstructive pulmonary disease, unspecified: Secondary | ICD-10-CM | POA: Diagnosis not present

## 2013-12-11 DIAGNOSIS — F331 Major depressive disorder, recurrent, moderate: Secondary | ICD-10-CM

## 2013-12-11 DIAGNOSIS — R0989 Other specified symptoms and signs involving the circulatory and respiratory systems: Secondary | ICD-10-CM | POA: Diagnosis not present

## 2013-12-11 LAB — POCT GLYCOSYLATED HEMOGLOBIN (HGB A1C): HEMOGLOBIN A1C: 8.4

## 2013-12-11 LAB — GLUCOSE, CAPILLARY: Glucose-Capillary: 283 mg/dL — ABNORMAL HIGH (ref 70–99)

## 2013-12-11 MED ORDER — METFORMIN HCL 500 MG PO TABS: 250.0000 mg | ORAL_TABLET | Freq: Every day | ORAL | Status: DC

## 2013-12-11 MED ORDER — METOPROLOL TARTRATE 50 MG PO TABS: 50.0000 mg | ORAL_TABLET | Freq: Two times a day (BID) | ORAL | Status: DC

## 2013-12-11 MED ORDER — GABAPENTIN 600 MG PO TABS: 600.0000 mg | ORAL_TABLET | Freq: Three times a day (TID) | ORAL | Status: DC

## 2013-12-11 MED ORDER — LISINOPRIL 40 MG PO TABS: 40.0000 mg | ORAL_TABLET | Freq: Every day | ORAL | Status: DC

## 2013-12-11 MED ORDER — AMLODIPINE BESYLATE 5 MG PO TABS: 5.0000 mg | ORAL_TABLET | Freq: Every evening | ORAL | Status: DC

## 2013-12-11 MED ORDER — DULOXETINE HCL 60 MG PO CPEP: 60.0000 mg | ORAL_CAPSULE | ORAL | Status: DC

## 2013-12-11 MED ORDER — PANTOPRAZOLE SODIUM 20 MG PO TBEC: 20.0000 mg | DELAYED_RELEASE_TABLET | Freq: Every day | ORAL | Status: DC

## 2013-12-11 MED ORDER — BUTALBITAL-APAP-CAFFEINE 50-325-40 MG PO TABS: 1.0000 | ORAL_TABLET | Freq: Every day | ORAL | Status: DC

## 2013-12-11 MED ORDER — GLIPIZIDE ER 10 MG PO TB24: 10.0000 mg | ORAL_TABLET | Freq: Every day | ORAL | Status: DC

## 2013-12-11 NOTE — Progress Notes (Signed)
   Subjective:    Patient ID: [name], female    DOB: [birth year], 63 y.o.   MRN: [id number]  Diabetes Hypoglycemia symptoms include headaches. Pertinent negatives for diabetes include no chest pain.   [name] is a 63 year old female with Type 2 DM with neuropathy, hypertension, depression who presents today for medication refills. She has not been taking her medicine for the last 3 weeks since she ran out of them and had trouble paying for them financially. She is now covered by [employer] and [employer].   See Assessment & Plan for management of her chronic conditions.   Review of Systems  Eyes: Negative.   Cardiovascular: Negative for chest pain.  Gastrointestinal: Negative for abdominal pain.  Musculoskeletal: Positive for neck pain.  Neurological: Positive for numbness and headaches.       Objective:   Physical Exam  Neck:  Swelling noted above clavicle bilaterally  Cardiovascular: Regular rhythm and normal heart sounds.   Pulmonary/Chest: Effort normal.          Assessment & Plan:

## 2013-12-11 NOTE — Telephone Encounter (Signed)
Told patient to call [facility] to be sure her 03/2013 prescription for one touch ultra mini and supplies is still good. If so, she should be able to get them for < 10$ total with Medicare B and BCBS cards. She agreed to call if she cannot get them filled.

## 2013-12-11 NOTE — Assessment & Plan Note (Addendum)
-  Symptoms include irritability and have been worsening since she has stopped taking the medication. -Continue duloxetine 60mg

## 2013-12-11 NOTE — Assessment & Plan Note (Addendum)
-  BP today 163/94 not at goal <140/<90 -Continue meds include: amlodipine 5mg ; metoprolol 50mg  BID; lisinopril 40mg  -Stop HCTZ 12.5mg  due to increased frequency of urination -Will follow-up in 1 month for BP recheck   ATTENDING A&P BP not at goal today bc out of meds 3 weeks. Therefore, it is appropriate to resume meds, except for HCTZ, and recheck BP one month.

## 2013-12-11 NOTE — Patient Instructions (Signed)
For diabetes, check blood sugar with glucometer in the AM after waking up. If blood sugar is <100 or you are feeling dizzy or weak, please call so we can change medication since glipizide has been increased from 5 to 10 mg.   Please return for follow-up in 1 month.
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
## 2013-12-11 NOTE — Assessment & Plan Note (Addendum)
-  A1c today 8.4, increased 8.2 from 3 months ago -Continue gabapentin 600mg  -Increased glipizide 5mg ->10mg  -Decreased metformin 500mg  BID->250mg  due to diarrhea -Counseled patient on purchasing glucometer to check sugars regularly (once a day)   ATTENDING A&P She has a long h/o D and she contributes it to the metformin. But it does not sound as if her D got better since off metformin past 3 weeks.  1. Decrease metformin to 250 2. Increase glipizide to compensate 3. Reassess one month

## 2013-12-13 NOTE — Progress Notes (Signed)
I saw and evaluated the patient.  I personally confirmed the key portions of the history and exam documented by Dr. Patel and I reviewed pertinent patient test results.  The assessment, diagnosis, and plan were formulated together and I agree with the documentation in the resident's note. 

## 2013-12-13 NOTE — Assessment & Plan Note (Addendum)
Attending A&P:  Pt c/o nocturia and daytime urgency with incontinence if cannot get to toilet quickly enough. She had a UA in April that R/O infxn. This will need further assessment as an outpt with consideration of a Detrol type med. Rec Kegal's exercises today.   She has a long h/o D and she contributes it to the metformin. But it does not sound as if her D got better since off metformin past 3 weeks. She describes days of hard stool and days of watery stools. She denies constipation with resultant watery stools passing around the impacted stools. She denies blood and does not have anemia. Did loose 20 lbs past 6 months. She was referred to GI earlier this yr but did not keep appt. We are temp decreasing metformin per pt's request and will reassess in one month. If not better, she will need referral to GI for colonoscopy. Would also need to ask about artifical sweetener use, water source, and whether the weight loss was intentional.

## 2013-12-17 ENCOUNTER — Ambulatory Visit (INDEPENDENT_AMBULATORY_CARE_PROVIDER_SITE_OTHER): Payer: Medicare Other | Admitting: Internal Medicine

## 2013-12-17 ENCOUNTER — Encounter: Payer: Self-pay | Admitting: Internal Medicine

## 2013-12-17 VITALS — BP 126/82 | HR 79 | Wt 198.9 lb

## 2013-12-17 DIAGNOSIS — R42 Dizziness and giddiness: Secondary | ICD-10-CM

## 2013-12-17 DIAGNOSIS — R269 Unspecified abnormalities of gait and mobility: Secondary | ICD-10-CM | POA: Diagnosis not present

## 2013-12-17 DIAGNOSIS — R29898 Other symptoms and signs involving the musculoskeletal system: Secondary | ICD-10-CM | POA: Diagnosis not present

## 2013-12-17 DIAGNOSIS — E119 Type 2 diabetes mellitus without complications: Secondary | ICD-10-CM | POA: Diagnosis not present

## 2013-12-17 DIAGNOSIS — E1149 Type 2 diabetes mellitus with other diabetic neurological complication: Secondary | ICD-10-CM | POA: Diagnosis not present

## 2013-12-17 DIAGNOSIS — I10 Essential (primary) hypertension: Secondary | ICD-10-CM

## 2013-12-17 DIAGNOSIS — I959 Hypotension, unspecified: Secondary | ICD-10-CM | POA: Diagnosis not present

## 2013-12-17 DIAGNOSIS — F329 Major depressive disorder, single episode, unspecified: Secondary | ICD-10-CM | POA: Diagnosis not present

## 2013-12-17 DIAGNOSIS — I509 Heart failure, unspecified: Secondary | ICD-10-CM | POA: Diagnosis not present

## 2013-12-17 DIAGNOSIS — J449 Chronic obstructive pulmonary disease, unspecified: Secondary | ICD-10-CM | POA: Diagnosis not present

## 2013-12-17 DIAGNOSIS — R0609 Other forms of dyspnea: Secondary | ICD-10-CM | POA: Diagnosis not present

## 2013-12-17 DIAGNOSIS — I5030 Unspecified diastolic (congestive) heart failure: Secondary | ICD-10-CM | POA: Diagnosis not present

## 2013-12-17 DIAGNOSIS — R0902 Hypoxemia: Secondary | ICD-10-CM | POA: Diagnosis not present

## 2013-12-17 DIAGNOSIS — N179 Acute kidney failure, unspecified: Secondary | ICD-10-CM | POA: Diagnosis not present

## 2013-12-17 DIAGNOSIS — F17200 Nicotine dependence, unspecified, uncomplicated: Secondary | ICD-10-CM | POA: Diagnosis not present

## 2013-12-17 DIAGNOSIS — R51 Headache: Secondary | ICD-10-CM | POA: Diagnosis not present

## 2013-12-17 LAB — BASIC METABOLIC PANEL WITH GFR
BUN: 12 mg/dL (ref 6–23)
CO2: 25 mEq/L (ref 19–32)
CREATININE: 0.77 mg/dL (ref 0.50–1.10)
Calcium: 9.6 mg/dL (ref 8.4–10.5)
Chloride: 105 mEq/L (ref 96–112)
GFR, EST NON AFRICAN AMERICAN: 84 mL/min
GFR, Est African American: >89 mL/min
GLUCOSE: 161 mg/dL — AB (ref 70–99)
Potassium: 3.9 mEq/L (ref 3.5–5.3)
Sodium: 139 mEq/L (ref 135–145)

## 2013-12-17 NOTE — Assessment & Plan Note (Signed)
Dizziness without any other pertinent symptoms or physical findings. Suspect secondary to low blood pressures at home. (Restarted Amlodipine 5 mg qd, Metoprolol 50 mg BID, Lisinopril 40 mg qd on 12/11/13 as she wasn't taking them for 3 weeks prior to that. Also patient appears clinically dehydrated).  Patient is not orthostatic in the clinic and is asymptomatic. Patient was hospitalized for dizziness and hypotension from 09/28/13 to 09/30/13. Discussed with the attending regarding further management and plan.  Plans: D/C Amlodipine for now. (For two reasons: 1. To allow blood pressures to run slightly high around 130-140's range. 2. To avoid potential adverse effect of dizziness from peripheral vasodilation caused by Amlodipine.) Continue Lisinopril and Metoprolol at the current strength. Check BMP to rule out AKI given her clinical dehydration and not urinating. Recommended to check her BP when she feels dizzy. Follow up in 2 weeks to recheck her BP and adjust medications as needed.

## 2013-12-17 NOTE — Patient Instructions (Signed)
Stop taking the Amlodipine 5 mg. Continue all the other medications. Check your blood pressures at home when you feel dizzy.

## 2013-12-17 NOTE — Assessment & Plan Note (Signed)
Well controlled. Presenting with dizziness of two episodes. See my A/P under dizziness.  Plans: D/C Amlodipine. Continue Lisinopril and Metoprolol.
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
## 2013-12-18 NOTE — Progress Notes (Signed)
Case discussed with Dr. Boggala at the time of the visit.  We reviewed the resident's history and exam and pertinent patient test results.  I agree with the assessment, diagnosis, and plan of care documented in the resident's note. 

## 2014-01-01 ENCOUNTER — Ambulatory Visit (INDEPENDENT_AMBULATORY_CARE_PROVIDER_SITE_OTHER): Payer: Medicare Other | Admitting: Internal Medicine

## 2014-01-01 ENCOUNTER — Encounter: Payer: Self-pay | Admitting: Internal Medicine

## 2014-01-01 VITALS — BP 161/83 | HR 60 | Temp 98.1°F | Ht 67.0 in | Wt 199.5 lb

## 2014-01-01 DIAGNOSIS — F17200 Nicotine dependence, unspecified, uncomplicated: Secondary | ICD-10-CM | POA: Diagnosis not present

## 2014-01-01 DIAGNOSIS — I509 Heart failure, unspecified: Secondary | ICD-10-CM | POA: Diagnosis not present

## 2014-01-01 DIAGNOSIS — E1165 Type 2 diabetes mellitus with hyperglycemia: Principal | ICD-10-CM

## 2014-01-01 DIAGNOSIS — R29898 Other symptoms and signs involving the musculoskeletal system: Secondary | ICD-10-CM | POA: Diagnosis not present

## 2014-01-01 DIAGNOSIS — R0609 Other forms of dyspnea: Secondary | ICD-10-CM | POA: Diagnosis not present

## 2014-01-01 DIAGNOSIS — R42 Dizziness and giddiness: Secondary | ICD-10-CM | POA: Diagnosis not present

## 2014-01-01 DIAGNOSIS — E1142 Type 2 diabetes mellitus with diabetic polyneuropathy: Secondary | ICD-10-CM | POA: Diagnosis not present

## 2014-01-01 DIAGNOSIS — I5030 Unspecified diastolic (congestive) heart failure: Secondary | ICD-10-CM | POA: Diagnosis not present

## 2014-01-01 DIAGNOSIS — E1149 Type 2 diabetes mellitus with other diabetic neurological complication: Secondary | ICD-10-CM

## 2014-01-01 DIAGNOSIS — J4489 Other specified chronic obstructive pulmonary disease: Secondary | ICD-10-CM | POA: Diagnosis not present

## 2014-01-01 DIAGNOSIS — R0902 Hypoxemia: Secondary | ICD-10-CM | POA: Diagnosis not present

## 2014-01-01 DIAGNOSIS — J449 Chronic obstructive pulmonary disease, unspecified: Secondary | ICD-10-CM | POA: Diagnosis not present

## 2014-01-01 DIAGNOSIS — I959 Hypotension, unspecified: Secondary | ICD-10-CM | POA: Diagnosis not present

## 2014-01-01 DIAGNOSIS — R0989 Other specified symptoms and signs involving the circulatory and respiratory systems: Secondary | ICD-10-CM | POA: Diagnosis not present

## 2014-01-01 DIAGNOSIS — R51 Headache: Secondary | ICD-10-CM

## 2014-01-01 DIAGNOSIS — E119 Type 2 diabetes mellitus without complications: Secondary | ICD-10-CM | POA: Diagnosis not present

## 2014-01-01 DIAGNOSIS — R269 Unspecified abnormalities of gait and mobility: Secondary | ICD-10-CM | POA: Diagnosis not present

## 2014-01-01 DIAGNOSIS — I10 Essential (primary) hypertension: Secondary | ICD-10-CM

## 2014-01-01 DIAGNOSIS — F329 Major depressive disorder, single episode, unspecified: Secondary | ICD-10-CM | POA: Diagnosis not present

## 2014-01-01 DIAGNOSIS — E1140 Type 2 diabetes mellitus with diabetic neuropathy, unspecified: Secondary | ICD-10-CM

## 2014-01-01 DIAGNOSIS — N179 Acute kidney failure, unspecified: Secondary | ICD-10-CM | POA: Diagnosis not present

## 2014-01-01 DIAGNOSIS — IMO0002 Reserved for concepts with insufficient information to code with codable children: Secondary | ICD-10-CM

## 2014-01-01 LAB — GLUCOSE, CAPILLARY: Glucose-Capillary: 191 mg/dL — ABNORMAL HIGH (ref 70–99)

## 2014-01-01 MED ORDER — METFORMIN HCL 500 MG PO TABS: 250.0000 mg | ORAL_TABLET | Freq: Two times a day (BID) | ORAL | Status: DC

## 2014-01-01 MED ORDER — GABAPENTIN 600 MG PO TABS: 900.0000 mg | ORAL_TABLET | Freq: Three times a day (TID) | ORAL | Status: DC

## 2014-01-01 MED ORDER — BUTALBITAL-APAP-CAFFEINE 50-325-40 MG PO TABS: 1.0000 | ORAL_TABLET | Freq: Every day | ORAL | Status: DC

## 2014-01-01 MED ORDER — AMLODIPINE BESYLATE 5 MG PO TABS: 5.0000 mg | ORAL_TABLET | Freq: Every day | ORAL | Status: DC

## 2014-01-01 NOTE — Assessment & Plan Note (Addendum)
Assessment -Suspect tension/muscle contraction headache given that this medicine controls her symptoms and based on her description at last visit  Plan -Refilled her prescription today for another 30 days given that patient has only 7 days left of medication which correlates with when it was last filled -She will follow-up with me in 2-3 weeks

## 2014-01-01 NOTE — Assessment & Plan Note (Addendum)
Assessment -Suspect decreased PO intake in the setting of heat -Another possibility includes adverse effects of her medication given that some Fioricet & gabapentin can this symptom -Improved from last time since she has not had any new symptoms  Plan -Restart amlodipine 5mg  today since her BP is now 161/83 -Encouraged her to maintain her fluid intake given the weather -Plan to f/u in 2-3 weeks to reassess given that gabapentin was increased to help her manage her pain

## 2014-01-01 NOTE — Assessment & Plan Note (Addendum)
Lab Results  Component Value Date   HGBA1C 8.4 12/11/2013   HGBA1C 8.2 09/09/2013   HGBA1C 8.3 06/14/2013     Assessment: Diabetes control: fair control Progress toward A1C goal:  unable to assess Comments:  -POC glucose today is slightly improved (191 vs 283); may be reflective of metformin dose or possibly not eating this morning -Neuropathic pain no better from last time.   Plan: Medications:  metformin 500mg ; glipizide 10mg  Home glucose monitoring: Frequency: no home glucose monitoring Timing: N/A Instruction/counseling given: reminded to bring blood glucose meter & log to each visit Educational resources provided: brochure Self management tools provided: home glucose logbook;other (see comments) (to bring in at next visit) Other plans:  -Continue taking metformin 500mg  (1/2 tablet BID) and glipizide 10mg  -Advised her to start checking her sugars regularly and logging them -Increased gabapentin 600mg  TID->900mg  TID with goal of helping her sleep at night and not complete elimination of pain

## 2014-01-01 NOTE — Assessment & Plan Note (Signed)
BP Readings from Last 3 Encounters:  01/01/14 161/83  12/17/13 126/82  12/11/13 151/101    Lab Results  Component Value Date   NA 139 12/17/2013   K 3.9 12/17/2013   CREATININE 0.77 12/17/2013    Assessment: Blood pressure control: moderately elevated Progress toward BP goal:  unable to assess Comments:  -BP worse than last time given that she was stopped on amlodipine 5mg   Plan: Medications:  lisinopril 40mg ; metoprolol 50mg BID; amlodipine 5mg  Educational resources provided: brochure Self management tools provided:   Other plans:  -Restarted amlodipine 5mg  today -Advised her to maintain fluid intake to avoid symptoms from last time

## 2014-01-01 NOTE — Progress Notes (Signed)
   Subjective:    Patient ID: Briley Bumgarner, female    DOB: July 29, 1950, 63 y.o.   MRN: [id number]  HPI Ms. Loredo is a 63 year old female with HTN, DM2 (A1c 8.4, 7/1), MDD who presents today for a follow-up visit.  HTN: Today her BP is 161/83. On 7/7, she came in complaining of dizziness and having fallen twice. Her BP then was 126/82. Amlodipine 5mg  was stopped. She attributes her falls last time to dehydration in the summer heat. She now alternates drinking a bottle of water (5-6 bottles) with drinking diet soda (3-4 bottles) to keep up. She denies any new dizziness or falls at this time since then.  DM2:  Her glucose today is 191. She is out of metformin because she took them incorrectly; instead of taking 1/2 500mg  tablet once a day, she took the 1/2 twice a day; her effective dose remained 500mg  albeit 250mg  BID. Her dose was decreased at her last visit due to diarrhea, but this is not a problem today. Her other medication is glipizide 10mg  at dinner. She plans to use her late husband's meter and strips to better account for her sugars.  She feels her neuropathic pain is keeping her up at night. She currently takes gapabentin 600mg  TID but feels she was getting 4x that dose when she lived in [place]. She has tried Lyrica in the past, but she did not find it helpful. She denies any new pain.  Headache: She needs a refill of her Fioricet. She has not had issues with this medication at all and is currently on a medication contract with [place].   Otherwise, she denies chest pain, dyspnea,    Review of Systems  Constitutional: Negative for fever, diaphoresis and fatigue.  Respiratory: Negative for shortness of breath.   Cardiovascular: Negative for chest pain.  Neurological: Negative for dizziness and syncope.       Neuropathic pain on both feet  All other systems reviewed and are negative.      Objective:   Physical Exam  Constitutional: She is oriented to person, place, and time.  She appears well-developed and well-nourished. No distress.  Sitting in a wheelchair  HENT:  Head: Normocephalic and atraumatic.  Eyes: Conjunctivae are normal. Pupils are equal, round, and reactive to light.  Cardiovascular: Normal rate, regular rhythm and normal heart sounds.  Exam reveals no gallop and no friction rub.   No murmur heard. Pulmonary/Chest: Breath sounds normal. No respiratory distress. She has no wheezes. She has no rales.  Neurological: She is alert and oriented to person, place, and time. No cranial nerve deficit.  Skin: Skin is warm and dry. She is not diaphoretic.  Psychiatric: She has a normal mood and affect. Her behavior is normal.          Assessment & Plan:

## 2014-01-01 NOTE — Patient Instructions (Signed)
For your gabapentin, we are going to gradually increase your dose.  -7/23-7/25: take 3.5 tablets in the morning, 3 tablets the second and third time -7/26-7/28: Sun/Mon/Tue: take 3.5 tablets for the first & second doses; 3 tablets for the third dose -7/29-7/31: take 3.5 tablets three times a day  For your blood pressure, we are restarting amlodipine 5mg . Please keep drinking fluids given the hot weather.   For diabetes, please take metformin 1/2 tablet morning and evening. Please also write down your sugars and bring them in to your next visit.  Please bring your medicines with you each time you come.   Medicines may be  Eye drops  Herbal   Vitamins  Pills  Seeing these help [place] take care of you.  I look forward to seeing you back in 2-4 weeks. Thank you!

## 2014-01-02 NOTE — Progress Notes (Signed)
Attending physician note: I personally interviewed and examined this patient together with resident physician Dr. Rushil Patel and I concur with his assessment and management plan. James Granfortuna, M.D., FACP 

## 2014-01-09 ENCOUNTER — Telehealth: Payer: Self-pay | Admitting: *Deleted
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
## 2014-01-09 NOTE — Telephone Encounter (Signed)
Discussed with Bonnita Nasuti.  Agree with plan.

## 2014-01-10 ENCOUNTER — Encounter: Payer: Self-pay | Admitting: Internal Medicine

## 2014-01-10 ENCOUNTER — Ambulatory Visit: Payer: BC Managed Care – PPO | Admitting: Internal Medicine

## 2014-01-10 ENCOUNTER — Ambulatory Visit (INDEPENDENT_AMBULATORY_CARE_PROVIDER_SITE_OTHER): Payer: Medicare Other | Admitting: Internal Medicine

## 2014-01-10 VITALS — BP 131/77 | HR 64 | Temp 97.7°F | Ht 67.0 in | Wt 200.9 lb

## 2014-01-10 DIAGNOSIS — S51859A Open bite of unspecified forearm, initial encounter: Secondary | ICD-10-CM | POA: Insufficient documentation

## 2014-01-10 DIAGNOSIS — E1142 Type 2 diabetes mellitus with diabetic polyneuropathy: Secondary | ICD-10-CM

## 2014-01-10 DIAGNOSIS — E1165 Type 2 diabetes mellitus with hyperglycemia: Secondary | ICD-10-CM

## 2014-01-10 DIAGNOSIS — E1140 Type 2 diabetes mellitus with diabetic neuropathy, unspecified: Secondary | ICD-10-CM

## 2014-01-10 DIAGNOSIS — IMO0002 Reserved for concepts with insufficient information to code with codable children: Secondary | ICD-10-CM

## 2014-01-10 DIAGNOSIS — S51851A Open bite of right forearm, initial encounter: Secondary | ICD-10-CM

## 2014-01-10 DIAGNOSIS — E1149 Type 2 diabetes mellitus with other diabetic neurological complication: Secondary | ICD-10-CM | POA: Diagnosis not present

## 2014-01-10 DIAGNOSIS — S51809A Unspecified open wound of unspecified forearm, initial encounter: Secondary | ICD-10-CM

## 2014-01-10 DIAGNOSIS — W503XXA Accidental bite by another person, initial encounter: Secondary | ICD-10-CM

## 2014-01-10 LAB — GLUCOSE, CAPILLARY: Glucose-Capillary: 243 mg/dL — ABNORMAL HIGH (ref 70–99)

## 2014-01-10 MED ORDER — AMOXICILLIN-POT CLAVULANATE 875-125 MG PO TABS: 1.0000 | ORAL_TABLET | Freq: Two times a day (BID) | ORAL | Status: AC

## 2014-01-10 NOTE — Patient Instructions (Signed)
Please take Augmentin 1 tablet every 12 hours for 7 days starting today.   Continue checking sugars and please bring your glucometer at your next visit.   Please bring your medicines with you each time you come.   Medicines may be  Eye drops  Herbal   Vitamins  Pills  Seeing these help [place] take care of you.  Thank you!
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
## 2014-01-11 NOTE — Assessment & Plan Note (Signed)
Assessment -She does not show signs of systemic infection. -Given her diabetes, she is at a higher risk for infections in general.  Plan -Prescribe Augmentin -Follow-up in 2 weeks at next visit

## 2014-01-11 NOTE — Assessment & Plan Note (Addendum)
Lab Results  Component Value Date   HGBA1C 8.4 12/11/2013   HGBA1C 8.2 09/09/2013   HGBA1C 8.3 06/14/2013     Assessment: Diabetes control: fair control Progress toward A1C goal:  unchanged Comments:  -I suspect her higher sugars are the result of increased stress at home. -She still has room to improve and will address at follow-up visit.  Plan: Medications:  metformin 250mg  BID; glipizide 10mg  Home glucose monitoring: Frequency: once a day Timing: before breakfast Instruction/counseling given: reminded to bring blood glucose meter & log to each visit Educational resources provided:   Self management tools provided:   Other plans:  -Encouraged her to continue checking sugars  -Reassess medical management at next visit -Check lipid panel at next visit

## 2014-01-13 NOTE — Progress Notes (Signed)
INTERNAL MEDICINE TEACHING ATTENDING ADDENDUM - Aldine Contes, MD: I personally saw and evaluated Ms. Sprandlin in this clinic visit in conjunction with the resident, Dr. Posey Pronto. I have discussed patient's plan of care with medical resident during this visit. I have confirmed the physical exam findings and have read and agree with the clinic note including the plan with the following addition: - On exam:-  4 erythematous lesions resembling bite marks noted on upper extremity - Will start augmentin for 7 days - BS noted to be elevated likely secondary to increase stress at home. Will monitor on current regimen for now

## 2014-01-17 ENCOUNTER — Other Ambulatory Visit: Payer: Self-pay | Admitting: Internal Medicine

## 2014-01-17 DIAGNOSIS — E1165 Type 2 diabetes mellitus with hyperglycemia: Principal | ICD-10-CM

## 2014-01-17 DIAGNOSIS — IMO0002 Reserved for concepts with insufficient information to code with codable children: Secondary | ICD-10-CM

## 2014-01-17 DIAGNOSIS — E1140 Type 2 diabetes mellitus with diabetic neuropathy, unspecified: Secondary | ICD-10-CM

## 2014-01-24 ENCOUNTER — Ambulatory Visit (INDEPENDENT_AMBULATORY_CARE_PROVIDER_SITE_OTHER): Payer: Medicare Other | Admitting: Internal Medicine

## 2014-01-24 ENCOUNTER — Encounter: Payer: Self-pay | Admitting: Dietician

## 2014-01-24 ENCOUNTER — Ambulatory Visit (INDEPENDENT_AMBULATORY_CARE_PROVIDER_SITE_OTHER): Payer: Medicare Other | Admitting: Dietician

## 2014-01-24 ENCOUNTER — Encounter: Payer: Self-pay | Admitting: Internal Medicine

## 2014-01-24 VITALS — BP 139/80 | HR 71 | Temp 97.5°F | Ht 66.0 in | Wt 203.0 lb

## 2014-01-24 DIAGNOSIS — R51 Headache: Secondary | ICD-10-CM

## 2014-01-24 DIAGNOSIS — E1149 Type 2 diabetes mellitus with other diabetic neurological complication: Secondary | ICD-10-CM | POA: Diagnosis not present

## 2014-01-24 DIAGNOSIS — S51809A Unspecified open wound of unspecified forearm, initial encounter: Secondary | ICD-10-CM | POA: Diagnosis not present

## 2014-01-24 DIAGNOSIS — I10 Essential (primary) hypertension: Secondary | ICD-10-CM

## 2014-01-24 DIAGNOSIS — Z5189 Encounter for other specified aftercare: Secondary | ICD-10-CM | POA: Diagnosis not present

## 2014-01-24 DIAGNOSIS — S51851D Open bite of right forearm, subsequent encounter: Secondary | ICD-10-CM

## 2014-01-24 DIAGNOSIS — IMO0002 Reserved for concepts with insufficient information to code with codable children: Secondary | ICD-10-CM

## 2014-01-24 DIAGNOSIS — E119 Type 2 diabetes mellitus without complications: Secondary | ICD-10-CM

## 2014-01-24 DIAGNOSIS — E1165 Type 2 diabetes mellitus with hyperglycemia: Principal | ICD-10-CM

## 2014-01-24 DIAGNOSIS — E1142 Type 2 diabetes mellitus with diabetic polyneuropathy: Secondary | ICD-10-CM

## 2014-01-24 DIAGNOSIS — E1140 Type 2 diabetes mellitus with diabetic neuropathy, unspecified: Secondary | ICD-10-CM

## 2014-01-24 DIAGNOSIS — W503XXD Accidental bite by another person, subsequent encounter: Secondary | ICD-10-CM

## 2014-01-24 LAB — LIPID PANEL
CHOLESTEROL: 308 mg/dL — AB (ref 0–200)
HDL: 39 mg/dL — AB (ref >39)
TRIGLYCERIDES: 712 mg/dL — AB (ref <150)
Total CHOL/HDL Ratio: 7.9 Ratio

## 2014-01-24 MED ORDER — ALBUTEROL SULFATE HFA 108 (90 BASE) MCG/ACT IN AERS: 1.0000 | INHALATION_SPRAY | Freq: Four times a day (QID) | RESPIRATORY_TRACT | Status: DC | PRN

## 2014-01-24 MED ORDER — METFORMIN HCL ER 500 MG PO TB24: 500.0000 mg | ORAL_TABLET | Freq: Every day | ORAL | Status: DC

## 2014-01-24 MED ORDER — GLUCOSE BLOOD VI STRP: ORAL_STRIP | Status: DC

## 2014-01-24 MED ORDER — ONETOUCH DELICA LANCETS FINE MISC: Status: DC

## 2014-01-24 MED ORDER — ONETOUCH ULTRA MINI W/DEVICE KIT: 1.0000 | PACK | Freq: Two times a day (BID) | Status: DC

## 2014-01-24 MED ORDER — BUTALBITAL-APAP-CAFFEINE 50-325-40 MG PO TABS: 1.0000 | ORAL_TABLET | Freq: Every day | ORAL | Status: DC

## 2014-01-24 NOTE — Progress Notes (Signed)
   Subjective:    Patient ID: [name], female    DOB: 06-Aug-1950, 63 y.o.   MRN: [id number]  HPI [name] is a 63 year old female with HTN, DM2 (A1c 8.4, 7/1), MDD who presents today for a follow-up visit.  Please see assessment & plan for documentation of her problems.  Review of Systems  Constitutional: Negative for fever.  Respiratory: Negative for shortness of breath.   Cardiovascular: Negative for chest pain.  Gastrointestinal: Negative for abdominal pain.  Neurological: Negative for dizziness, syncope, weakness and light-headedness.  All other systems reviewed and are negative.      Objective:   Physical Exam Constitutional: She is oriented to person, place, and time. She appears well-developed and well-nourished. No distress.  HENT:  Head: Normocephalic and atraumatic.  Eyes: Conjunctivae are normal. Pupils are equal, round, and reactive to light.  Cardiovascular: Normal rate, regular rhythm and normal heart sounds.  Exam reveals no gallop and no friction rub.   No murmur heard. Pulmonary/Chest: Effort normal. No respiratory distress. She has no wheezes. She has no rales.  Abdominal: Soft. Bowel sounds are normal. She exhibits no distension. There is no tenderness.  Neurological: She is alert and oriented to person, place, and time. No cranial nerve deficit. Coordination normal.  Skin: Wounds on right forearm are markedly improved.  Psychiatric: Her behavior is normal.         Assessment & Plan:

## 2014-01-24 NOTE — Patient Instructions (Addendum)
Please take metformin 500mg  XR instead of the metformin you are taking. I have refilled your Fioricet & albuterol.  Please follow-up in 4 weeks; thank you!  General Instructions:   Thank you for bringing your medicines today. This helps [place] keep you safe from mistakes.   Progress Toward Treatment Goals:  Treatment Goal 01/10/2014  Hemoglobin A1C unchanged  Blood pressure at goal  Stop smoking unable to assess  Prevent falls unable to assess    Self Care Goals & Plans:  Self Care Goal 01/10/2014  Manage my medications take my medicines as prescribed; bring my medications to every visit; refill my medications on time  Monitor my health keep track of my blood glucose; bring my glucose meter and log to each visit  Eat healthy foods eat foods that are low in salt; eat baked foods instead of fried foods  Be physically active find an activity I enjoy  Stop smoking set a quit date and stop smoking  Prevent falls wear appropriate shoes  Other -    Home Blood Glucose Monitoring 01/10/2014  Check my blood sugar once a day  When to check my blood sugar before breakfast     Care Management & Community Referrals:  Referral 12/11/2013  Referrals made for care management support none needed  Referrals made to community resources -

## 2014-01-24 NOTE — Progress Notes (Signed)
Patient ID: [name], female   DOB: 07/10/1950, 63 y.o.   MRN: [id number]  I saw and evaluated the patient.  I personally confirmed the key portions of the history and exam documented by Dr. Posey Pronto and I reviewed pertinent patient test results.  The assessment, diagnosis, and plan were formulated together and I agree with the documentation in the resident's note.

## 2014-01-24 NOTE — Progress Notes (Signed)
Medical Nutrition Therapy:  Appt start time: 1435 end time:  1500.  Assessment:  Primary concerns today: Blood sugar control and eye exam and meter problems.  Patient had no concerns today other than assistance with getting a meter she is trying to use from her deceased husband that she had bought new batteries for and it still wouldn't work.  New meter provided as this one was outdated. She assures me to dates of the strips are through 2017.  Learning Readiness: not ready, contemplating Barriers to learning/adherence to lifestyle change: unknown     Progress Towards Goal(s):  In progress.   Nutritional Diagnosis:  NB-1.4 Self-monitoring deficit As related to lack of working meter or how to obtain one.  As evidenced by her report and trying to use a dated one. .    Intervention:  Nutrition education about coverage of testing supplies by Medicare part B. Coordination of care: request testing supplies and  Handouts given during visit include: AVS Demonstrated degree of understanding via:  Teach Back   Monitoring/Evaluation:  Dietary intake, exercise, meter with readings, and body weight in 2 month(s).

## 2014-01-24 NOTE — Patient Instructions (Signed)
We discussed the following today:   Medicare Part B covers diabetes testing supplies, syringes and pen needles with a prescription.  Your eye exam results will be mailed to you if the diagnosis is negative/mild or moderate.  Please feel free to call me with any questions or concerns about your daibetes or nutrition!  Sincerely,  Butch Penny Yuval Nolet [phone]

## 2014-01-25 NOTE — Assessment & Plan Note (Addendum)
Lab Results  Component Value Date   HGBA1C 8.4 12/11/2013   HGBA1C 8.2 09/09/2013   HGBA1C 8.3 06/14/2013     Assessment: Diabetes control: fair control Progress toward A1C goal:  unchanged Comments: She doesn't have her glucometer with her today though reports that her sugars lately have been running 160-180 in the morning and 200+ before dinner; CBG today is 243. She is taking metformin 220m BID and glipizide 154mat night. She met with Ms. DoButch Pennyo get her eyes checked today. Her neuropathic pain is also better since her gabapentin was increased to 90060mID and has been able to go walking this past week.  Plan: Medications:  Metformin 250m32mD; glipizide 10mg29me glucose monitoring: Frequency: 2 times a day Timing: before breakfast;before dinner Instruction/counseling given: reminded to bring blood glucose meter & log to each visit Educational resources provided:   Self management tools provided:   Other plans:  -Switch to metformin XR 500mg 57mn that her dose was reduced only due to diarrhea -Consider increasing dose at next visit and potentially stopping glipizide -Continue to work on nutrition/walking -Check lipid panel today as it has been over a year ago since last checked-->LDL 178; will refer to Donna Butch Pennysider statin at next visit

## 2014-01-25 NOTE — Assessment & Plan Note (Signed)
BP Readings from Last 3 Encounters:  01/24/14 139/80  01/10/14 131/77  01/01/14 161/83    Lab Results  Component Value Date   NA 139 12/17/2013   K 3.9 12/17/2013   CREATININE 0.77 12/17/2013    Assessment: Blood pressure control: controlled Progress toward BP goal:  at goal Comments: BP well controlled today.  Plan: Medications:  lisinopril 40mg ; metoprolol 50mg  BID; amlodipine 5mg  Educational resources provided:   Self management tools provided:   Other plans:  -Continue current regimen

## 2014-01-25 NOTE — Assessment & Plan Note (Signed)
Assessment -Stable on Fioricet  Plan -Refill for 3 months

## 2014-01-25 NOTE — Assessment & Plan Note (Signed)
Assessment -Wound appears better. -She completed her treatment as well.  Plan -Resolved

## 2014-01-27 LAB — LDL CHOLESTEROL, DIRECT: Direct LDL: 178 mg/dL — ABNORMAL HIGH

## 2014-01-27 NOTE — Addendum Note (Signed)
Addended by: Riccardo Dubin on: 01/27/2014 02:24 PM   Modules accepted: Orders

## 2014-01-30 ENCOUNTER — Telehealth: Payer: Self-pay | Admitting: Dietician

## 2014-01-30 NOTE — Telephone Encounter (Signed)
Asked to schedule appointment to address lipids with lifestyle modifications. Patient's voicemail not taking messages. Scheduled her an appointment for same day as her next doctor appointment on 02/21/14. Mailed appointment days and time to patient.

## 2014-01-31 NOTE — Telephone Encounter (Signed)
Thanks Butch Penny. As I won't be seeing her, can you update her contact info when you see her? Thanks.

## 2014-02-14 ENCOUNTER — Encounter: Payer: BC Managed Care – PPO | Admitting: Internal Medicine

## 2014-02-21 ENCOUNTER — Ambulatory Visit: Payer: Medicare Other | Admitting: Internal Medicine

## 2014-02-21 ENCOUNTER — Ambulatory Visit: Payer: Medicare Other | Admitting: Dietician

## 2014-02-24 ENCOUNTER — Ambulatory Visit: Payer: Medicare Other | Admitting: Internal Medicine

## 2014-02-26 ENCOUNTER — Ambulatory Visit (INDEPENDENT_AMBULATORY_CARE_PROVIDER_SITE_OTHER): Payer: Medicare Other | Admitting: Internal Medicine

## 2014-02-26 ENCOUNTER — Encounter: Payer: Self-pay | Admitting: Internal Medicine

## 2014-02-26 VITALS — BP 150/73 | HR 62 | Temp 98.0°F | Ht 66.0 in | Wt 204.8 lb

## 2014-02-26 DIAGNOSIS — M549 Dorsalgia, unspecified: Secondary | ICD-10-CM

## 2014-02-26 DIAGNOSIS — E1165 Type 2 diabetes mellitus with hyperglycemia: Principal | ICD-10-CM

## 2014-02-26 DIAGNOSIS — E1142 Type 2 diabetes mellitus with diabetic polyneuropathy: Secondary | ICD-10-CM

## 2014-02-26 DIAGNOSIS — E785 Hyperlipidemia, unspecified: Secondary | ICD-10-CM | POA: Diagnosis not present

## 2014-02-26 DIAGNOSIS — E1169 Type 2 diabetes mellitus with other specified complication: Secondary | ICD-10-CM | POA: Insufficient documentation

## 2014-02-26 DIAGNOSIS — I10 Essential (primary) hypertension: Secondary | ICD-10-CM

## 2014-02-26 DIAGNOSIS — K5900 Constipation, unspecified: Secondary | ICD-10-CM

## 2014-02-26 DIAGNOSIS — E1140 Type 2 diabetes mellitus with diabetic neuropathy, unspecified: Secondary | ICD-10-CM

## 2014-02-26 DIAGNOSIS — IMO0002 Reserved for concepts with insufficient information to code with codable children: Secondary | ICD-10-CM

## 2014-02-26 DIAGNOSIS — E1149 Type 2 diabetes mellitus with other diabetic neurological complication: Secondary | ICD-10-CM | POA: Diagnosis not present

## 2014-02-26 MED ORDER — METFORMIN HCL ER 750 MG PO TB24: 750.0000 mg | ORAL_TABLET | Freq: Every day | ORAL | Status: DC

## 2014-02-26 MED ORDER — ROSUVASTATIN CALCIUM 40 MG PO TABS: 40.0000 mg | ORAL_TABLET | Freq: Every day | ORAL | Status: DC

## 2014-02-26 MED ORDER — ACETAMINOPHEN 500 MG PO TABS: 500.0000 mg | ORAL_TABLET | Freq: Four times a day (QID) | ORAL | Status: DC | PRN

## 2014-02-26 NOTE — Patient Instructions (Signed)
-Start taking Crestor (rouvastatin) 40mg  daily. This is for your cholesterol.  -Start taking metformin 750mg  daily. This is for diabetes.  -You may take Tylenol 500mg  1-2 tablets every 6 hours for back pain.  -You may use hot or cold patches for the back pain, avoid lifting furniture or heavy items.  -Follow up in 1 month with Dr. Posey Pronto to discuss your diabetes.   Thank you for bringing your medicines today. This helps [place] keep you safe from mistakes.   Lumbosacral Strain Lumbosacral strain is a strain of any of the parts that make up your lumbosacral vertebrae. Your lumbosacral vertebrae are the bones that make up the lower third of your backbone. Your lumbosacral vertebrae are held together by muscles and tough, fibrous tissue (ligaments).  CAUSES  A sudden blow to your back can cause lumbosacral strain. Also, anything that causes an excessive stretch of the muscles in the low back can cause this strain. This is typically seen when people exert themselves strenuously, fall, lift heavy objects, bend, or crouch repeatedly. RISK FACTORS  Physically demanding work.  Participation in pushing or pulling sports or sports that require a sudden twist of the back (tennis, golf, baseball).  Weight lifting.  Excessive lower back curvature.  Forward-tilted pelvis.  Weak back or abdominal muscles or both.  Tight hamstrings. SIGNS AND SYMPTOMS  Lumbosacral strain may cause pain in the area of your injury or pain that moves (radiates) down your leg.  DIAGNOSIS Your health care provider can often diagnose lumbosacral strain through a physical exam. In some cases, you may need tests such as X-ray exams.  TREATMENT  Treatment for your lower back injury depends on many factors that your clinician will have to evaluate. However, most treatment will include the use of anti-inflammatory medicines. HOME CARE INSTRUCTIONS   Avoid hard physical activities (tennis, racquetball, waterskiing) if you are not  in proper physical condition for it. This may aggravate or create problems.  If you have a back problem, avoid sports requiring sudden body movements. Swimming and walking are generally safer activities.  Maintain good posture.  Maintain a healthy weight.  For acute conditions, you may put ice on the injured area.  Put ice in a plastic bag.  Place a towel between your skin and the bag.  Leave the ice on for 20 minutes, 2-3 times a day.  When the low back starts healing, stretching and strengthening exercises may be recommended. SEEK MEDICAL CARE IF:  Your back pain is getting worse.  You experience severe back pain not relieved with medicines. SEEK IMMEDIATE MEDICAL CARE IF:   You have numbness, tingling, weakness, or problems with the use of your arms or legs.  There is a change in bowel or bladder control.  You have increasing pain in any area of the body, including your belly (abdomen).  You notice shortness of breath, dizziness, or feel faint.  You feel sick to your stomach (nauseous), are throwing up (vomiting), or become sweaty.  You notice discoloration of your toes or legs, or your feet get very cold. MAKE SURE YOU:   Understand these instructions.  Will watch your condition.  Will get help right away if you are not doing well or get worse. Document Released: 03/09/2005 Document Revised: 06/04/2013 Document Reviewed: 01/16/2013 [facility] Patient Information 2015 [place], [place]. This information is not intended to replace advice given to you by your health care provider. Make sure you discuss any questions you have with your health care provider.

## 2014-02-26 NOTE — Progress Notes (Signed)
   Subjective:    Patient ID: [name], female    DOB: January 02, 1951, 63 y.o.   MRN: [id number]  HPI [name] is a 63 year old female with HTN, DM2 (A1c 8.4, 7/1), MDD who presents today for evaluation of back pain, constipation, and follow up for her diabetes.  She reports that she lifted heavy furniture this weekend and sprained her lower back. She has pain and cramping in the right lower back that does not radiate to her legs.  She ate excessive amount of cheese last week and has not had a BM in 4 days but has Biscolax at home and will take it today.  She has been taking metformin and glipizide as prescribed, still working on her diet as far as sweets.  She is excited about the arrival of her first greatgrandchild that is due next week.    Review of Systems  Constitutional: Negative for fever, chills, diaphoresis, activity change, appetite change, fatigue and unexpected weight change.  Respiratory: Negative for cough, shortness of breath and wheezing.   Gastrointestinal: Positive for constipation.  Genitourinary: Negative for dysuria and frequency.  Musculoskeletal: Positive for back pain.  Neurological: Negative for dizziness, weakness and light-headedness.  Psychiatric/Behavioral: Negative for agitation.       Objective:   Physical Exam  Nursing note and vitals reviewed. Constitutional: She is oriented to person, place, and time. She appears well-developed and well-nourished. No distress.  Cardiovascular: Normal rate and regular rhythm.   Pulmonary/Chest: Effort normal and breath sounds normal. No respiratory distress.  Abdominal: Soft. Bowel sounds are normal. She exhibits no distension. There is no tenderness. There is no rebound.  Musculoskeletal: She exhibits tenderness. She exhibits no edema.  Paraspinal tenderness of right lumbar spine with nl spine flexion and extension  Neurological: She is alert and oriented to person, place, and time.  Skin: Skin is warm and  dry. She is not diaphoretic.  Psychiatric: She has a normal mood and affect. Her behavior is normal.          Assessment & Plan:

## 2014-02-26 NOTE — Assessment & Plan Note (Signed)
BP Readings from Last 3 Encounters:  02/26/14 150/73  01/24/14 139/80  01/10/14 131/77    Lab Results  Component Value Date   NA 139 12/17/2013   K 3.9 12/17/2013   CREATININE 0.77 12/17/2013    Assessment: Blood pressure control:  Mildly elevated today Progress toward BP goal:   not at goal Comments: She is on Norvasc 5mg  daily, Lisinopril 40mg  daily, and lopressor 50mg  BID and is compliant with this tx. She is in pain today which is likely the cause of the mild elevation of her BP.   Plan: Medications:  continue current medications Educational resources provided: brochure;handout;video Self management tools provided:   Other plans: Follow up in 1 month

## 2014-02-26 NOTE — Assessment & Plan Note (Addendum)
Lipid panel from 01/24/14 with total cholesterol of 308, triglyceride of 712, HDL of 39, Direct LDL of 178. Her [place] Coronary Heart Disease Risk Score is 24.4%.  -Start high intensity statin, rouvastatin 40mg  daily--the pt does not want to take Lipitor but is willing to try Crestor

## 2014-02-26 NOTE — Assessment & Plan Note (Signed)
She reports that she ate too much cheese with no BM for 4 days.  She will reduce her intake of cheese.  -Biscolax PRN for constipation

## 2014-02-26 NOTE — Assessment & Plan Note (Signed)
Likely due to strain from moving heavy furniture.  -Tylenol 500mg  1-2 tablet q6hr for pain,  -Heat/ice packs -back stretch exercises (provided pamphlet)

## 2014-02-26 NOTE — Assessment & Plan Note (Signed)
Lab Results  Component Value Date   HGBA1C 8.4 12/11/2013   HGBA1C 8.2 09/09/2013   HGBA1C 8.3 06/14/2013     Assessment: Diabetes control:  Not controlled Progress toward A1C goal:   Not at goal Comments: She is on metformin ER 500mg  daily and glipizide 10mg  24hr. She brought her CBG meter today and has readings in the 140s-500s range at home with last HgbA1c of 8.4%.   Plan: Medications:  increased metformin ER to 750mg  daily, continue glipizide 10mg  24 h.   Home glucose monitoring: Frequency:   Timing:   Instruction/counseling given: reminded to bring blood glucose meter & log to each visit, reminded to bring medications to each visit, discussed the need for weight loss and discussed diet Educational resources provided: brochure;handout Self management tools provided: copy of home glucose meter download Other plans: Follow up in 1 month. She will see the diabetes educator during before then for advise on lifestyle modification.

## 2014-02-27 NOTE — Progress Notes (Signed)
Medicine attending: Medical history, presenting problems, physical findings, and medications, reviewed with resident physician Dr. Hayes Ludwig and  I concur her with her evaluation and management. Murriel Hopper, M.D., [place]

## 2014-03-07 DIAGNOSIS — H04129 Dry eye syndrome of unspecified lacrimal gland: Secondary | ICD-10-CM | POA: Diagnosis not present

## 2014-03-07 DIAGNOSIS — E119 Type 2 diabetes mellitus without complications: Secondary | ICD-10-CM | POA: Diagnosis not present

## 2014-03-07 DIAGNOSIS — H25019 Cortical age-related cataract, unspecified eye: Secondary | ICD-10-CM | POA: Diagnosis not present

## 2014-03-07 LAB — HM DIABETES EYE EXAM

## 2014-03-10 ENCOUNTER — Encounter: Payer: Self-pay | Admitting: *Deleted

## 2014-03-14 ENCOUNTER — Encounter: Payer: Self-pay | Admitting: Internal Medicine

## 2014-03-14 NOTE — Progress Notes (Signed)
Patient ID: [name], female   DOB: 08/23/1950, 63 y.o.   MRN: [id number]  I am reviewing clinical documentation today from [facility] received 03/07/14:    No diabetic retinopathy was detected on retinal exam.

## 2014-03-28 ENCOUNTER — Ambulatory Visit: Payer: Medicare Other | Admitting: Internal Medicine

## 2014-04-03 ENCOUNTER — Ambulatory Visit (INDEPENDENT_AMBULATORY_CARE_PROVIDER_SITE_OTHER): Payer: Medicare Other | Admitting: Internal Medicine

## 2014-04-03 ENCOUNTER — Encounter: Payer: Self-pay | Admitting: Internal Medicine

## 2014-04-03 VITALS — BP 131/85 | HR 59 | Temp 97.8°F | Ht 66.0 in | Wt 201.6 lb

## 2014-04-03 DIAGNOSIS — E1165 Type 2 diabetes mellitus with hyperglycemia: Secondary | ICD-10-CM

## 2014-04-03 DIAGNOSIS — F331 Major depressive disorder, recurrent, moderate: Secondary | ICD-10-CM

## 2014-04-03 DIAGNOSIS — I10 Essential (primary) hypertension: Secondary | ICD-10-CM | POA: Diagnosis not present

## 2014-04-03 DIAGNOSIS — E1142 Type 2 diabetes mellitus with diabetic polyneuropathy: Secondary | ICD-10-CM

## 2014-04-03 DIAGNOSIS — E1140 Type 2 diabetes mellitus with diabetic neuropathy, unspecified: Secondary | ICD-10-CM | POA: Diagnosis not present

## 2014-04-03 DIAGNOSIS — IMO0002 Reserved for concepts with insufficient information to code with codable children: Secondary | ICD-10-CM

## 2014-04-03 LAB — POCT GLYCOSYLATED HEMOGLOBIN (HGB A1C): HEMOGLOBIN A1C: 8

## 2014-04-03 LAB — GLUCOSE, CAPILLARY: Glucose-Capillary: 224 mg/dL — ABNORMAL HIGH (ref 70–99)

## 2014-04-03 MED ORDER — GLUCOSE BLOOD VI STRP: 1.0000 | ORAL_STRIP | Freq: Two times a day (BID) | Status: DC

## 2014-04-03 MED ORDER — METFORMIN HCL ER (OSM) 1000 MG PO TB24: 1000.0000 mg | ORAL_TABLET | Freq: Every day | ORAL | Status: DC

## 2014-04-03 NOTE — Addendum Note (Signed)
Addended by: Hulan Fray on: 04/03/2014 05:46 PM   Modules accepted: Orders

## 2014-04-03 NOTE — Assessment & Plan Note (Signed)
Symptoms are manageable at this time.  Cont with Cymbalta and Neurontin

## 2014-04-03 NOTE — Assessment & Plan Note (Addendum)
Noted increased depressive symptoms well on Cymbalta 60 mg daily. Patient reports that this medication helps with her neuropathy and a couple of times she has run out of cymbalta, her neuropathic pain worsened. I discussed with her that if Cymbalta is not effective for her depressive symptoms, we can consider switching her to a new antidepressant. The other option is to wait and see if she improves after her current family stressors have diminished. Patient opted to continue with Cymbalta. I also pointed out to her that metoprolol has been reported to cause depression and fatigue in about 5% of patients. Her heart rate at 59. We agreed to discontinue metoprolol.   Plan. -Discontinue Metoprorol -Continue with Cymbalta 60 mg daily -Evaluate patient in 2 weeks -May consider switching to a different antidepressant if her current family stressors have resolved, but symptoms persist.  Addendum 04/07/2014 Patient reports that her depressive symptoms have persisted. She reports to be tearful for now reason and she is desperate to try something. I discussed with Dr. Eppie Gibson about the different options for treating her depression and both felt that adding Welbutrin might be better than an additional SSRI or SNRI. As noted during her visit with me, she currently has ongoing family stressors. She now requests for some pharmacotherapy to help with her depression. Plan -Continue with Cymbalta 60 mg daily. -Add Wellbutrin 150 mg every morning. May go up to 300 mg daily if poor response in three weeks. -May consider discontinuing Gabapentin as this medication sometimes can be associated with increased fatigue. -Discussed with the patient that she might take up to 3 weeks before she feels better.

## 2014-04-03 NOTE — Progress Notes (Signed)
Patient ID: [name], female   DOB: [birth year], 63 y.o.   MRN: [id number]   Subjective:   HPI: [name] is a 63 y.o. woman with HTN, DM2, MDD who presents today for followup for diabetes.  Patient reports that, recently she's been under a lot of stress due to family problems and as a result, her depression symptoms have been more severe. She is currently taking Cymbalta 60 mg daily. She was reports history of feeling excessively fatigued. However, she denies any constitutional symptoms at this time. She was last seen in this clinic on 02/26/2014 where her metformin dose was increased from 500 mg daily to 750 mg daily. She's been tolerating this increased dose. She brings in her glucose meter, which reveals no hypoglycemia episodes, but her blood sugars running upper 100s to 200s. She also takes glipizide 10 mg daily.  Kindly see the A&P for the status of the pt's chronic medical problems.   ROS: Constitutional: Denies fever, chills, diaphoresis, appetite change. Respiratory: Denies SOB, DOE, cough, chest tightness, and wheezing. Denies chest pain. CVS: No chest pain, palpitations and leg swelling.  GI: No abdominal pain, nausea, vomiting, bloody stools GU: No dysuria, frequency, hematuria, or flank pain.  MSK: No myalgias, back pain, joint swelling, arthralgias  Psych: Increase in depression symptoms. No SI or SA.    Objective:  Physical Exam: Filed Vitals:   04/03/14 0943  BP: 131/85  Pulse: 59  Temp: 97.8 F (36.6 C)  TempSrc: Oral  Height: 5\' 6"  (1.676 m)  Weight: 201 lb 9.6 oz (91.445 kg)  SpO2: 98%   General: Well nourished. No acute distress.  HEENT: Normal oral mucosa. MMM.  Lungs: CTA bilaterally. Heart: RRR; no extra sounds or murmurs  Abdomen: Non-distended, normal bowel sounds, soft, nontender; no hepatosplenomegaly  Extremities: No pedal edema. No joint swelling or tenderness. Neurologic: Normal EOM,  Alert and oriented x3. No obvious  neurologic/cranial nerve deficits.  Assessment & Plan:  Discussed case with my attending in the clinic, Dr. Lynnae January. See problem based charting.

## 2014-04-03 NOTE — Patient Instructions (Signed)
General Instructions: Please increase Metformin 1,000 mg daily Please come back in 1-2 months and see if you need to increase your medications   Thank you for bringing your medicines today. This helps [place] keep you safe from mistakes.   Progress Toward Treatment Goals:  Treatment Goal 04/03/2014  Hemoglobin A1C improved  Blood pressure at goal  Stop smoking -  Prevent falls -    Self Care Goals & Plans:  Self Care Goal 04/03/2014  Manage my medications take my medicines as prescribed; bring my medications to every visit  Monitor my health bring my blood pressure log to each visit; keep track of my blood glucose; bring my glucose meter and log to each visit  Eat healthy foods -  Be physically active -  Stop smoking -  Prevent falls -  Other -    Home Blood Glucose Monitoring 04/03/2014  Check my blood sugar 2 times a day  When to check my blood sugar before breakfast; before dinner     Care Management & Community Referrals:  Referral 12/11/2013  Referrals made for care management support none needed  Referrals made to community resources -

## 2014-04-03 NOTE — Assessment & Plan Note (Signed)
BP Readings from Last 3 Encounters:  04/03/14 131/85  02/26/14 150/73  01/24/14 139/80    Lab Results  Component Value Date   NA 139 12/17/2013   K 3.9 12/17/2013   CREATININE 0.77 12/17/2013    Assessment: Blood pressure control: controlled Progress toward BP goal:  at goal Comments: On amlodipine 5 mg daily, lisinopril 40 mg daily, and metoprolol, 50 mg twice a day. Heart rate is 59 today and has been relatively low in the past.  Plan: Medications:  Will discontinue metoprolol due to relative bradycardia. Patient doesn't have a history of CAD. or systolic heart failure Educational resources provided:   Self management tools provided:   Other plans: May consider increase of amlodipine to 10 mg daily during her next visit if BP is high.

## 2014-04-03 NOTE — Assessment & Plan Note (Signed)
Lab Results  Component Value Date   HGBA1C 8.0 04/03/2014   HGBA1C 8.4 12/11/2013   HGBA1C 8.2 09/09/2013     Assessment: Diabetes control: fair control Progress toward A1C goal:  improved Comments: Requests me to still reveals CBGs in the ranges of 150-200.  Plan: Medications:  Increase Metformin ER from 750 mg daily to 1000 mg daily. Continue with the glipizide ER 10 mg daily Home glucose monitoring: Frequency: 2 times a day Timing: before breakfast;before dinner Instruction/counseling given: reminded to bring blood glucose meter & log to each visit Educational resources provided:   Self management tools provided: copy of home glucose meter download Other plans: Followup in 2 weeks. If he tolerates this increased dose of Metformin, may further increase it by 500 mg weekly. This also room to increase her glipizide dose if needed.

## 2014-04-04 ENCOUNTER — Telehealth: Payer: Self-pay | Admitting: *Deleted

## 2014-04-04 NOTE — Progress Notes (Signed)
Internal Medicine Clinic Attending  Case discussed with Dr. Kazibwe at the time of the visit.  We reviewed the resident's history and exam and pertinent patient test results.  I agree with the assessment, diagnosis, and plan of care documented in the resident's note. 

## 2014-04-04 NOTE — Telephone Encounter (Signed)
Call from pt - states she needs something for the depression besides Cymbalta, which is helping the neuropathy. States she mentioned this to the doctor yesterday.  Tearful on the phone. Thanks

## 2014-04-07 MED ORDER — BUPROPION HCL ER (XL) 150 MG PO TB24: 150.0000 mg | ORAL_TABLET | Freq: Every day | ORAL | Status: DC

## 2014-04-07 NOTE — Addendum Note (Signed)
Addended by: Jessee Avers on: 04/07/2014 10:52 AM   Modules accepted: Orders

## 2014-04-10 ENCOUNTER — Ambulatory Visit: Payer: Medicare Other | Admitting: Internal Medicine

## 2014-04-11 ENCOUNTER — Encounter: Payer: Self-pay | Admitting: Internal Medicine

## 2014-04-11 ENCOUNTER — Ambulatory Visit (INDEPENDENT_AMBULATORY_CARE_PROVIDER_SITE_OTHER): Payer: Medicare Other | Admitting: Internal Medicine

## 2014-04-11 ENCOUNTER — Other Ambulatory Visit: Payer: Self-pay | Admitting: *Deleted

## 2014-04-11 VITALS — BP 128/68 | HR 85 | Temp 97.9°F | Ht 66.0 in | Wt 202.0 lb

## 2014-04-11 DIAGNOSIS — E1165 Type 2 diabetes mellitus with hyperglycemia: Secondary | ICD-10-CM

## 2014-04-11 DIAGNOSIS — E1140 Type 2 diabetes mellitus with diabetic neuropathy, unspecified: Secondary | ICD-10-CM

## 2014-04-11 DIAGNOSIS — Z8673 Personal history of transient ischemic attack (TIA), and cerebral infarction without residual deficits: Secondary | ICD-10-CM | POA: Diagnosis not present

## 2014-04-11 DIAGNOSIS — E785 Hyperlipidemia, unspecified: Secondary | ICD-10-CM

## 2014-04-11 DIAGNOSIS — I10 Essential (primary) hypertension: Secondary | ICD-10-CM | POA: Diagnosis not present

## 2014-04-11 DIAGNOSIS — IMO0002 Reserved for concepts with insufficient information to code with codable children: Secondary | ICD-10-CM

## 2014-04-11 LAB — GLUCOSE, CAPILLARY: GLUCOSE-CAPILLARY: 307 mg/dL — AB (ref 70–99)

## 2014-04-11 MED ORDER — GLUCOSE BLOOD VI STRP: 1.0000 | ORAL_STRIP | Freq: Two times a day (BID) | Status: DC

## 2014-04-11 MED ORDER — ROSUVASTATIN CALCIUM 40 MG PO TABS: 40.0000 mg | ORAL_TABLET | Freq: Every day | ORAL | Status: DC

## 2014-04-11 NOTE — Patient Instructions (Addendum)
General Instructions: Please start taking aspirin 81 mg daily  Pleas continue with all your medications Please come back 1-2 month   Please bring your medicines with you each time you come to clinic.  Medicines may include prescription medications, over-the-counter medications, herbal remedies, eye drops, vitamins, or other pills.   Progress Toward Treatment Goals:  Treatment Goal 04/03/2014  Hemoglobin A1C improved  Blood pressure at goal  Stop smoking -  Prevent falls -    Self Care Goals & Plans:  Self Care Goal 04/11/2014  Manage my medications take my medicines as prescribed; bring my medications to every visit; refill my medications on time  Monitor my health bring my glucose meter and log to each visit  Eat healthy foods eat more vegetables; eat foods that are low in salt; eat baked foods instead of fried foods  Be physically active take a walk every day; find an activity I enjoy  Stop smoking -  Prevent falls -  Other -    Home Blood Glucose Monitoring 04/03/2014  Check my blood sugar 2 times a day  When to check my blood sugar before breakfast; before dinner     Care Management & Community Referrals:  Referral 12/11/2013  Referrals made for care management support none needed  Referrals made to community resources -

## 2014-04-12 NOTE — Assessment & Plan Note (Signed)
During her last visit a few days ago, I increased her Metformin to 1000 mg once daily. Patient has been tolerating this dose since her last visit. The plan was to increase her dose further but she is hesitant. She would like to cont with the current dose for a few more weeks before we can increase it.  Plan  - Continue with metformin 1000 mg daily. Patient is likely to benefit from a higher doses when she is ready. - Continue with the glipizide 10 mg daily - Follow-up with PCP

## 2014-04-12 NOTE — Assessment & Plan Note (Signed)
Blood pressure is well controlled. Continue with amlodipine 5 mg daily and lisinopril 40 mg daily.

## 2014-04-12 NOTE — Assessment & Plan Note (Signed)
Current symptoms with tingling and numbness on the right side of her chin raises a concern for stroke which if it is the case, have been present for 2 days. She does not have any other neurological sings on careful physical exam. She states that she has not been taking her aspirin. Her risk factors for stroke include diabetes, hypertension, and hyperlipidemia. Discussed with the patient about the benefit of taking all her medications to reduce her risk factors. I do not see a benefit for imaging at this time. Discussed with the patient about signs and symptoms of stroke and to present to the emergency department if she is concerned. Discussed with Dr. Beryle Beams about the plan. Plan -Start taking aspirin 81 mg daily -Continue with Lipitor -Aggressive blood pressure control. Currently stable. -Further interventions unlikely to be beneficial at this time. Provided her with information on signs and symptoms of stroke.

## 2014-04-12 NOTE — Progress Notes (Signed)
Patient ID: [name], female   DOB: [birth year], 63 y.o.   MRN: [id number].   Subjective:   HPI: [name] is a 63 y.o. woman with HTN, DM2, MDD who presents today for followup for diabetes and also complaining of tingling and numbness on the right side of her chin.   Reason(s) for this visit: 1. Numbness and tingling: Patient reports that she has been having numbness and tingling on the right side of her chin for the past 2 days. She is concerned about the possibility of a stroke. When she first experienced these symptoms she did not present to the emergency department. Symptoms have not progressed. Patient denies dysarthria, difficulty swallowing, visual, auditory problems. She does not have weakness in her limbs or numbness. She has not suffered dizziness. She reports headaches. No other associated symptoms. She has a history of TIA and history of mentioned in her problem list. She is not taking aspirin currently.   ROS: Constitutional: Denies fever, chills, diaphoresis, appetite change and fatigue.  Respiratory: Denies SOB, DOE, cough, chest tightness, and wheezing. Denies chest pain. CVS: No chest pain, palpitations and leg swelling.  GI: No abdominal pain, nausea, vomiting, bloody stools GU: No dysuria, frequency, hematuria, or flank pain.  MSK: No myalgias, back pain, joint swelling, arthralgias  Psych: No depression symptoms. No SI or SA.    Objective:  Physical Exam: Filed Vitals:   04/11/14 1431  BP: 128/68  Pulse: 85  Temp: 97.9 F (36.6 C)  TempSrc: Oral  Height: 5\' 6"  (1.676 m)  Weight: 202 lb (91.627 kg)  SpO2: 99%   General: Well nourished. No acute distress.  HEENT: Normal oral mucosa. MMM.  Lungs: CTA bilaterally. Heart: RRR; no extra sounds or murmurs  Abdomen: Non-distended, normal bowel sounds, soft, nontender; no hepatosplenomegaly  Extremities: No pedal edema. No joint swelling or tenderness.  Neurologic Exam:   Mental Status: Alert,  oriented, thought content appropriate.  Speech fluent without evidence of aphasia.   Cranial Nerves:   II: Visual fields grossly intact.  III/IV/VI: Extraocular movements intact.  Pupils reactive bilaterally.  V/VII: Smile symmetric. Reports sensation alteration on the right side of her face on light touch. Left side light touch sensation is normal.  VIII: Grossly intact.  IX/X: Normal gag.  XI: Bilateral shoulder shrug normal.  XII: Midline tongue extension normal.  Motor:  5/5 bilaterally with normal tone and bulk  Sensory:  Pinprick and light touch intact throughout, bilaterally  DTRs: 2+ and symmetric throughout  Plantars:  Downgoing bilaterally  Cerebellar: Normal finger-to-nose, normal rapid alternating movements and normal heel-to-shin test.  Normal gait and station.     Assessment & Plan:  Discussed case with my attending in the clinic, Dr. Beryle Beams. See problem based charting.

## 2014-04-12 NOTE — Assessment & Plan Note (Signed)
Refilled her Crestor as requested by patient. She wants to try OTC Niacin due to elevated TGs

## 2014-04-14 NOTE — Progress Notes (Signed)
Medicine attending: Medical history, current complaints, physical findings, and medications, reviewed with resident physician Dr. Jessee Avers and I concur with his evaluation and management plan. This patient has an area on her chin which is numb. She has no focal neurologic deficits. Clinical suspicion that she is having a neurologic event is extremely low. Murriel Hopper, M.D., [place]

## 2014-04-14 NOTE — Progress Notes (Signed)
Medicine attending: Medical history, current complaint, physical findings, and medications, reviewed with resident physician Dr. Jessee Avers and I concur with his evaluation and management plan. Murriel Hopper, M.D., [place]

## 2014-04-30 ENCOUNTER — Other Ambulatory Visit: Payer: Self-pay | Admitting: Internal Medicine

## 2014-05-01 NOTE — Telephone Encounter (Signed)
As this patient was seen by Dr. Alice Rieger on 10/22 and deemed necessary for their treatment as documented in the EHR, I will refill this prescription for gabapentin.

## 2014-05-07 DIAGNOSIS — H2513 Age-related nuclear cataract, bilateral: Secondary | ICD-10-CM | POA: Diagnosis not present

## 2014-05-07 DIAGNOSIS — H04123 Dry eye syndrome of bilateral lacrimal glands: Secondary | ICD-10-CM | POA: Diagnosis not present

## 2014-05-14 ENCOUNTER — Encounter: Payer: Self-pay | Admitting: Internal Medicine

## 2014-05-14 ENCOUNTER — Ambulatory Visit (INDEPENDENT_AMBULATORY_CARE_PROVIDER_SITE_OTHER): Payer: Medicare Other | Admitting: Internal Medicine

## 2014-05-14 VITALS — BP 139/98 | HR 68 | Temp 97.4°F | Ht 66.0 in | Wt 202.4 lb

## 2014-05-14 DIAGNOSIS — E1140 Type 2 diabetes mellitus with diabetic neuropathy, unspecified: Secondary | ICD-10-CM | POA: Diagnosis not present

## 2014-05-14 DIAGNOSIS — M549 Dorsalgia, unspecified: Secondary | ICD-10-CM

## 2014-05-14 DIAGNOSIS — E1165 Type 2 diabetes mellitus with hyperglycemia: Secondary | ICD-10-CM | POA: Diagnosis not present

## 2014-05-14 DIAGNOSIS — IMO0002 Reserved for concepts with insufficient information to code with codable children: Secondary | ICD-10-CM

## 2014-05-14 DIAGNOSIS — R300 Dysuria: Secondary | ICD-10-CM | POA: Diagnosis not present

## 2014-05-14 DIAGNOSIS — S29012A Strain of muscle and tendon of back wall of thorax, initial encounter: Secondary | ICD-10-CM | POA: Diagnosis not present

## 2014-05-14 LAB — POCT URINALYSIS DIPSTICK
Bilirubin, UA: NEGATIVE
Glucose, UA: 500
Ketones, UA: NEGATIVE
Nitrite, UA: NEGATIVE
PH UA: 5.5
RBC UA: NEGATIVE
Spec Grav, UA: 1.025
UROBILINOGEN UA: 0.2

## 2014-05-14 LAB — GLUCOSE, CAPILLARY: Glucose-Capillary: 317 mg/dL — ABNORMAL HIGH (ref 70–99)

## 2014-05-14 MED ORDER — METFORMIN HCL ER (OSM) 1000 MG PO TB24: 1000.0000 mg | ORAL_TABLET | Freq: Every day | ORAL | Status: DC

## 2014-05-14 MED ORDER — NAPROXEN 500 MG PO TABS: 500.0000 mg | ORAL_TABLET | Freq: Two times a day (BID) | ORAL | Status: DC

## 2014-05-14 NOTE — Assessment & Plan Note (Addendum)
Assessment: Patient presents with a one week history of right sided back pain located over inferior latissimus dorsi and erector spinae muscles. Pain is constant , 7/10, worse with movement, no radiation. Patient tried aspirin without relief. Patient does admit to recent back strain 2 weeks ago during the birth of her great-granddaughter. Patient assisted during the labor process by holding up her grandaughter's legs for many hours. Her daughter was also sore afterwards. Of note, patient did develop dysuria yesterday. There was question if her right sided back pain was flank pain from pyelonephritis; however urinalysis showed trace leukocytes, negative nitrites. Patient denies fever, chills, nausea, vomiting, suprapubic pain, polyuria or difficulty voiding. She has a history of cystitis in the past, but no history of pyelonephritis. Patient has no lloyd's sign and is without suprapubic tenderness on exam. She is afebrile and normotensive.   Plan: -Naproxen 500 mg BID prn with meals

## 2014-05-14 NOTE — Patient Instructions (Signed)
General Instructions:   Please bring your medicines with you each time you come to clinic.  Medicines may include prescription medications, over-the-counter medications, herbal remedies, eye drops, vitamins, or other pills.  New prescriptions: Naproxen 500 mg 1 tablet twice a day as needed with meals for pain. If pain does improved, please call our clinic.   You do not have a Urinary Tract Infection at this time. If you develop, fevers, chills, nausea, vomiting or dysuria does not improve, please call our clinic and we can send in antibiotics for you.  Please take your Metformin 1000 mg 1 time per day. We will have you follow up in about a month with your PCP.  Back Pain, Adult Low back pain is very common. About 1 in 5 people have back pain.The cause of low back pain is rarely dangerous. The pain often gets better over time.About half of people with a sudden onset of back pain feel better in just 2 weeks. About 8 in 10 people feel better by 6 weeks.  CAUSES Some common causes of back pain include:  Strain of the muscles or ligaments supporting the spine.  Wear and tear (degeneration) of the spinal discs.  Arthritis.  Direct injury to the back. DIAGNOSIS Most of the time, the direct cause of low back pain is not known.However, back pain can be treated effectively even when the exact cause of the pain is unknown.Answering your caregiver's questions about your overall health and symptoms is one of the most accurate ways to make sure the cause of your pain is not dangerous. If your caregiver needs more information, he or she may order lab work or imaging tests (X-rays or MRIs).However, even if imaging tests show changes in your back, this usually does not require surgery. HOME CARE INSTRUCTIONS For many people, back pain returns.Since low back pain is rarely dangerous, it is often a condition that people can learn to [facility] their own.   Remain active. It is stressful on the back to  sit or stand in one place. Do not sit, drive, or stand in one place for more than 30 minutes at a time. Take short walks on level surfaces as soon as pain allows.Try to increase the length of time you walk each day.  Do not stay in bed.Resting more than 1 or 2 days can delay your recovery.  Do not avoid exercise or work.Your body is made to move.It is not dangerous to be active, even though your back may hurt.Your back will likely heal faster if you return to being active before your pain is gone.  Pay attention to your body when you bend and lift. Many people have less discomfortwhen lifting if they bend their knees, keep the load close to their bodies,and avoid twisting. Often, the most comfortable positions are those that put less stress on your recovering back.  Find a comfortable position to sleep. Use a firm mattress and lie on your side with your knees slightly bent. If you lie on your back, put a pillow under your knees.  Only take over-the-counter or prescription medicines as directed by your caregiver. Over-the-counter medicines to reduce pain and inflammation are often the most helpful.Your caregiver may prescribe muscle relaxant drugs.These medicines help dull your pain so you can more quickly return to your normal activities and healthy exercise.  Put ice on the injured area.  Put ice in a plastic bag.  Place a towel between your skin and the bag.  Leave the ice  on for 15-20 minutes, 03-04 times a day for the first 2 to 3 days. After that, ice and heat may be alternated to reduce pain and spasms.  Ask your caregiver about trying back exercises and gentle massage. This may be of some benefit.  Avoid feeling anxious or stressed.Stress increases muscle tension and can worsen back pain.It is important to recognize when you are anxious or stressed and learn ways to manage it.Exercise is a great option. SEEK MEDICAL CARE IF:  You have pain that is not relieved with rest  or medicine.  You have pain that does not improve in 1 week.  You have new symptoms.  You are generally not feeling well. SEEK IMMEDIATE MEDICAL CARE IF:   You have pain that radiates from your back into your legs.  You develop new bowel or bladder control problems.  You have unusual weakness or numbness in your arms or legs.  You develop nausea or vomiting.  You develop abdominal pain.  You feel faint. Document Released: 05/30/2005 Document Revised: 11/29/2011 Document Reviewed: 10/01/2013 [facility] Patient Information 2015 [place], [place]. This information is not intended to replace advice given to you by your health care provider. Make sure you discuss any questions you have with your health care provider.

## 2014-05-14 NOTE — Assessment & Plan Note (Signed)
Assessment: Dysuria x 1 day. Urinalysis showed trace leukocytes, negative nitrites. Patient denies fever, chills, nausea, vomiting, suprapubic pain, polyuria or difficulty voiding. She has a history of cystitis in the past, but no history of pyelonephritis. Patient has no lloyd's sign and is without suprapubic tenderness on exam. She is afebrile and normotensive.   Plan: -Monitor -If dysuria worsens or persists or if patient develops fever, chills, nausea, vomiting, suprapubic pain, she was instructed to call the clinic and we can send her in a prescription for Septra.

## 2014-05-14 NOTE — Assessment & Plan Note (Signed)
Assessment: -CBG 371 -UA >500 glucose, no ketones -Out of Metformin for 1-2 weeks  Plan: -Refilled Metformin 1000 mg daily -Will follow up with PCP in 4-6 weeks -Consider increasing metformin at this time or starting glipizide

## 2014-05-14 NOTE — Progress Notes (Signed)
Subjective:     Patient ID: [name], female   DOB: 07-21-1950, 63 y.o.   MRN: [id number]  HPI [name] is a 63 yo female with PMHx of HTN, T2DM w neuropathy, depression, HLD, dCHF, TIA and back pain who presents with complaint of left sided pain. Please see problem oriented charting for more information.  Review of Systems General: Denies fever, chills, fatigue, change in appetite and diaphoresis.  Respiratory: Denies SOB, cough, DOE, chest tightness, and wheezing.   Cardiovascular: Denies chest pain and palpitations.  Gastrointestinal: Denies nausea, vomiting, abdominal pain, diarrhea, constipation, blood in stool and abdominal distention.  Genitourinary: Admits to dysuria, but denies urgency, frequency, hematuria, suprapubic pain. Endocrine: Denies polyuria, and polydipsia. Musculoskeletal: Admits to right sided back pain, myalgias, back pain. Denies joint swelling, arthralgias and gait problem.  Skin: Denies pallor, rash and wounds.  Neurological: Denies dizziness, headaches, weakness, lightheadedness, numbness, seizures, and syncope. Psychiatric/Behavioral: Denies mood changes, confusion, nervousness, sleep disturbance and agitation.    Objective:   Physical Exam Filed Vitals:   05/14/14 1034  BP: 139/98  Pulse: 68  Temp: 97.4 F (36.3 C)  TempSrc: Oral  Height: 5\' 6"  (1.676 m)  Weight: 202 lb 6.4 oz (91.808 kg)  SpO2: 99%   General: Vital signs reviewed.  Patient is well-developed and well-nourished, in no acute distress and cooperative with exam.   Cardiovascular: RRR, S1 normal, S2 normal, no murmurs, gallops, or rubs. Pulmonary/Chest: Clear to auscultation bilaterally, no wheezes, rales, or rhonchi. Abdominal: Soft, non-tender, no suprapubic tenderness, non-distended, BS +, no masses, organomegaly, or guarding present.  Musculoskeletal: Right sided tenderness over inferior latissimus dorsi and erector spinae muscles. No joint deformities, erythema, or stiffness,  ROM full and nontender. Extremities: No lower extremity edema bilaterally,  pulses symmetric and intact bilaterally. No cyanosis or clubbing. Skin: Warm, dry and intact. No rashes or erythema. Psychiatric: Normal mood and affect. speech and behavior is normal. Cognition and memory are normal.     Assessment:         Plan:     Please see problem based assessment and plan.

## 2014-05-16 LAB — URINE CULTURE
Colony Count: NO GROWTH
Organism ID, Bacteria: NO GROWTH

## 2014-05-16 NOTE — Progress Notes (Signed)
INTERNAL MEDICINE TEACHING ATTENDING ADDENDUM - Aldine Contes, MD: I personally saw and evaluated Ms. Forgy in this clinic visit in conjunction with the resident, Dr. Marvel Plan. I have discussed patient's plan of care with medical resident during this visit. I have confirmed the physical exam findings and have read and agree with the clinic note including the plan with the following addition: - Pt with likely muscular strain - started on naproxen. Will f/u - pt with uncontrolled DM but off her metformin * 1-2 weeks. Will resume metformin and ask patient to f/u in 4- 6 weeks. Will consider adding glipizide and increasing metformin if remains elevated

## 2014-06-08 ENCOUNTER — Other Ambulatory Visit: Payer: Self-pay | Admitting: Internal Medicine

## 2014-06-09 NOTE — Telephone Encounter (Signed)
As this patient was seen by me on 10/2 and deemed necessary for their treatment as documented in the EHR, I will refill this prescription for Fioricet.

## 2014-06-18 DIAGNOSIS — H04123 Dry eye syndrome of bilateral lacrimal glands: Secondary | ICD-10-CM | POA: Diagnosis not present

## 2014-06-18 DIAGNOSIS — H2513 Age-related nuclear cataract, bilateral: Secondary | ICD-10-CM | POA: Diagnosis not present

## 2014-07-02 DIAGNOSIS — H2513 Age-related nuclear cataract, bilateral: Secondary | ICD-10-CM | POA: Diagnosis not present

## 2014-07-02 DIAGNOSIS — H04123 Dry eye syndrome of bilateral lacrimal glands: Secondary | ICD-10-CM | POA: Diagnosis not present

## 2014-07-11 ENCOUNTER — Emergency Department (HOSPITAL_COMMUNITY): Payer: Medicare Other

## 2014-07-11 ENCOUNTER — Encounter (HOSPITAL_COMMUNITY): Payer: Self-pay | Admitting: *Deleted

## 2014-07-11 ENCOUNTER — Emergency Department (HOSPITAL_COMMUNITY)
Admission: EM | Admit: 2014-07-11 | Discharge: 2014-07-11 | Disposition: A | Discharge status: Home / Self Care | Payer: Medicare Other | Attending: Emergency Medicine | Admitting: Emergency Medicine

## 2014-07-11 ENCOUNTER — Encounter: Payer: Medicare Other | Admitting: Internal Medicine

## 2014-07-11 DIAGNOSIS — Z720 Tobacco use: Secondary | ICD-10-CM | POA: Insufficient documentation

## 2014-07-11 DIAGNOSIS — F329 Major depressive disorder, single episode, unspecified: Secondary | ICD-10-CM | POA: Insufficient documentation

## 2014-07-11 DIAGNOSIS — R112 Nausea with vomiting, unspecified: Secondary | ICD-10-CM | POA: Diagnosis not present

## 2014-07-11 DIAGNOSIS — K2970 Gastritis, unspecified, without bleeding: Secondary | ICD-10-CM | POA: Diagnosis not present

## 2014-07-11 DIAGNOSIS — G8929 Other chronic pain: Secondary | ICD-10-CM | POA: Diagnosis not present

## 2014-07-11 DIAGNOSIS — Z79899 Other long term (current) drug therapy: Secondary | ICD-10-CM | POA: Insufficient documentation

## 2014-07-11 DIAGNOSIS — Z7982 Long term (current) use of aspirin: Secondary | ICD-10-CM | POA: Diagnosis not present

## 2014-07-11 DIAGNOSIS — J449 Chronic obstructive pulmonary disease, unspecified: Secondary | ICD-10-CM | POA: Insufficient documentation

## 2014-07-11 DIAGNOSIS — R1032 Left lower quadrant pain: Secondary | ICD-10-CM

## 2014-07-11 DIAGNOSIS — K760 Fatty (change of) liver, not elsewhere classified: Secondary | ICD-10-CM | POA: Diagnosis not present

## 2014-07-11 DIAGNOSIS — N390 Urinary tract infection, site not specified: Secondary | ICD-10-CM | POA: Diagnosis not present

## 2014-07-11 DIAGNOSIS — I509 Heart failure, unspecified: Secondary | ICD-10-CM | POA: Diagnosis not present

## 2014-07-11 DIAGNOSIS — I10 Essential (primary) hypertension: Secondary | ICD-10-CM | POA: Insufficient documentation

## 2014-07-11 DIAGNOSIS — R079 Chest pain, unspecified: Secondary | ICD-10-CM | POA: Diagnosis not present

## 2014-07-11 DIAGNOSIS — E119 Type 2 diabetes mellitus without complications: Secondary | ICD-10-CM | POA: Insufficient documentation

## 2014-07-11 DIAGNOSIS — Z791 Long term (current) use of non-steroidal anti-inflammatories (NSAID): Secondary | ICD-10-CM | POA: Diagnosis not present

## 2014-07-11 DIAGNOSIS — Z87828 Personal history of other (healed) physical injury and trauma: Secondary | ICD-10-CM | POA: Insufficient documentation

## 2014-07-11 DIAGNOSIS — R109 Unspecified abdominal pain: Secondary | ICD-10-CM

## 2014-07-11 DIAGNOSIS — K529 Noninfective gastroenteritis and colitis, unspecified: Secondary | ICD-10-CM | POA: Insufficient documentation

## 2014-07-11 DIAGNOSIS — N201 Calculus of ureter: Secondary | ICD-10-CM | POA: Diagnosis not present

## 2014-07-11 DIAGNOSIS — B9689 Other specified bacterial agents as the cause of diseases classified elsewhere: Secondary | ICD-10-CM | POA: Diagnosis not present

## 2014-07-11 DIAGNOSIS — R1012 Left upper quadrant pain: Secondary | ICD-10-CM | POA: Diagnosis present

## 2014-07-11 HISTORY — DX: Brachial plexus disorders: G54.0

## 2014-07-11 LAB — COMPREHENSIVE METABOLIC PANEL
ALT: 35 U/L (ref 0–35)
AST: 35 U/L (ref 0–37)
Albumin: 4 g/dL (ref 3.5–5.2)
Alkaline Phosphatase: 67 U/L (ref 39–117)
Anion gap: 13 (ref 5–15)
BUN: 18 mg/dL (ref 6–23)
CALCIUM: 9.9 mg/dL (ref 8.4–10.5)
CO2: 20 mmol/L (ref 19–32)
Chloride: 103 mmol/L (ref 96–112)
Creatinine, Ser: 1.23 mg/dL — ABNORMAL HIGH (ref 0.50–1.10)
GFR calc Af Amer: 54 mL/min — ABNORMAL LOW (ref >90)
GFR calc non Af Amer: 46 mL/min — ABNORMAL LOW (ref >90)
GLUCOSE: 325 mg/dL — AB (ref 70–99)
Potassium: 4.5 mmol/L (ref 3.5–5.1)
SODIUM: 136 mmol/L (ref 135–145)
Total Bilirubin: 0.8 mg/dL (ref 0.3–1.2)
Total Protein: 7.2 g/dL (ref 6.0–8.3)

## 2014-07-11 LAB — URINALYSIS, ROUTINE W REFLEX MICROSCOPIC
Glucose, UA: 250 mg/dL — AB
KETONES UR: 15 mg/dL — AB
NITRITE: NEGATIVE
PH: 5 (ref 5.0–8.0)
PROTEIN: 100 mg/dL — AB
UROBILINOGEN UA: 1 mg/dL (ref 0.0–1.0)

## 2014-07-11 LAB — CBC WITH DIFFERENTIAL/PLATELET
Basophils Absolute: 0.1 10*3/uL (ref 0.0–0.1)
Basophils Relative: 1 % (ref 0–1)
Eosinophils Absolute: 0 10*3/uL (ref 0.0–0.7)
Eosinophils Relative: 0 % (ref 0–5)
HEMATOCRIT: 44.2 % (ref 36.0–46.0)
HEMOGLOBIN: 14.9 g/dL (ref 12.0–15.0)
LYMPHS ABS: 1.3 10*3/uL (ref 0.7–4.0)
LYMPHS PCT: 13 % (ref 12–46)
MCH: 31.4 pg (ref 26.0–34.0)
MCHC: 33.7 g/dL (ref 30.0–36.0)
MCV: 93.1 fL (ref 78.0–100.0)
MONOS PCT: 4 % (ref 3–12)
Monocytes Absolute: 0.5 10*3/uL (ref 0.1–1.0)
Neutro Abs: 8.5 10*3/uL — ABNORMAL HIGH (ref 1.7–7.7)
Neutrophils Relative %: 82 % — ABNORMAL HIGH (ref 43–77)
Platelets: 182 10*3/uL (ref 150–400)
RBC: 4.75 MIL/uL (ref 3.87–5.11)
RDW: 13.3 % (ref 11.5–15.5)
WBC: 10.4 10*3/uL (ref 4.0–10.5)

## 2014-07-11 LAB — LIPASE, BLOOD: LIPASE: 48 U/L (ref 11–59)

## 2014-07-11 LAB — URINE MICROSCOPIC-ADD ON

## 2014-07-11 LAB — BRAIN NATRIURETIC PEPTIDE: B Natriuretic Peptide: 20.2 pg/mL (ref 0.0–100.0)

## 2014-07-11 LAB — TROPONIN I: Troponin I: <0.03 ng/mL (ref <0.031)

## 2014-07-11 LAB — I-STAT CG4 LACTIC ACID, ED: Lactic Acid, Venous: 2.75 mmol/L (ref 0.5–2.0)

## 2014-07-11 MED ORDER — HYDROMORPHONE HCL 1 MG/ML IJ SOLN
1.0000 mg | Freq: Once | INTRAMUSCULAR | Status: AC
  Administered 2014-07-11: 1 mg via INTRAVENOUS
  Filled 2014-07-11: qty 1

## 2014-07-11 MED ORDER — ONDANSETRON HCL 4 MG/2ML IJ SOLN
4.0000 mg | Freq: Once | INTRAMUSCULAR | Status: AC
  Administered 2014-07-11: 4 mg via INTRAVENOUS
  Filled 2014-07-11: qty 2

## 2014-07-11 MED ORDER — CIPROFLOXACIN IN D5W 400 MG/200ML IV SOLN
400.0000 mg | Freq: Once | INTRAVENOUS | Status: AC
  Administered 2014-07-11: 400 mg via INTRAVENOUS
  Filled 2014-07-11: qty 200

## 2014-07-11 MED ORDER — HYDROCODONE-ACETAMINOPHEN 5-325 MG PO TABS: 1.0000 | ORAL_TABLET | ORAL | Status: DC | PRN

## 2014-07-11 MED ORDER — ONDANSETRON HCL 4 MG PO TABS
4.0000 mg | ORAL_TABLET | Freq: Four times a day (QID) | ORAL | Status: DC
Start: 2014-07-11 — End: 2015-01-23

## 2014-07-11 MED ORDER — METRONIDAZOLE 500 MG PO TABS: 500.0000 mg | ORAL_TABLET | Freq: Three times a day (TID) | ORAL | Status: DC

## 2014-07-11 MED ORDER — CIPROFLOXACIN HCL 500 MG PO TABS: 500.0000 mg | ORAL_TABLET | Freq: Two times a day (BID) | ORAL | Status: DC

## 2014-07-11 MED ORDER — IOHEXOL 300 MG/ML  SOLN
25.0000 mL | Freq: Once | INTRAMUSCULAR | Status: AC | PRN
  Administered 2014-07-11: 25 mL via ORAL

## 2014-07-11 MED ORDER — SODIUM CHLORIDE 0.9 % IV SOLN
Freq: Once | INTRAVENOUS | Status: AC
  Administered 2014-07-11: 150 mL/h via INTRAVENOUS

## 2014-07-11 MED ORDER — IOHEXOL 300 MG/ML  SOLN
80.0000 mL | Freq: Once | INTRAMUSCULAR | Status: AC | PRN
  Administered 2014-07-11: 80 mL via INTRAVENOUS

## 2014-07-11 NOTE — ED Notes (Signed)
Radiology notified that pt has completed PO contrast

## 2014-07-11 NOTE — ED Notes (Signed)
Pt in from home via [facility], pt c/o LUQ abd pain, n/v/d, pt reports x3 vomiting episodes since yesterday, pt c/o diarrhea onset this am x 4, pt denies bloody & dark colored stools, denies hematemesis, currently the pt is not taking DM PO meds x 5 days, pt A&O x4, follows commands, speaks in complete sentences

## 2014-07-11 NOTE — ED Notes (Signed)
Zofran 4 mg given to pt in route to ED

## 2014-07-11 NOTE — Discharge Instructions (Signed)

## 2014-07-11 NOTE — ED Provider Notes (Signed)
CSN: [id number]     Arrival date & time 07/11/14  0847 History   First MD Initiated Contact with Patient 07/11/14 [phone]     Chief Complaint  Patient presents with  . Abdominal Pain     (Consider location/radiation/quality/duration/timing/severity/associated sxs/prior Treatment) HPI Comments: Patient presents to the ER for evaluation of nausea, vomiting, diarrhea and abdominal pain. Patient reports that she started with nausea and vomiting yesterday. She vomited multiple times through the day. Overnight she continued to have nausea and vomiting, this morning developed watery diarrhea. Once the diarrhea started, she started having sharp, stabbing, crampy pains in the left upper abdomen. She has not had a fever. No rectal bleeding.   Past Medical History  Diagnosis Date  . Hypertension   . Diabetes mellitus without complication     diagnosed around 2010, only ever on metformin  . Chronic pain     neck pain, headache, neuropathy  . Hypertriglyceridemia   . Neuromuscular disorder   . COPD (chronic obstructive pulmonary disease)   . CHF (congestive heart failure)   . Depression   . Puncture wound of foot, right 05/17/2012    Tetanus shot 3 yrs ago at [facility] in [place], per pt report   . Brachial plexus disorders    Past Surgical History  Procedure Laterality Date  . Rotator cuff repair    . Carpal tunnel release    . Abdominal hysterectomy    . Cholecystectomy     Family History  Problem Relation Age of Onset  . Hyperlipidemia Mother   . Heart attack Father 30  . Hypertension Father   . Cancer Paternal Grandfather     Lung cancer  . Cancer Maternal Grandmother   . Heart attack Maternal Grandmother    History  Substance Use Topics  . Smoking status: Current Every Day Smoker -- 0.50 packs/day for 41 years    Types: Cigarettes  . Smokeless tobacco: Never Used     Comment: Trying to cut back. 1/2PPD  . Alcohol Use: No   OB History    No data available     Review of  Systems  Constitutional: Negative for fever.  Gastrointestinal: Positive for nausea, vomiting, abdominal pain and diarrhea. Negative for blood in stool.  All other systems reviewed and are negative.     Allergies  Flexeril and Soma  Home Medications   Prior to Admission medications   Medication Sig Start Date End Date Taking? Authorizing Provider  acetaminophen (TYLENOL) 500 MG tablet Take 1 tablet (500 mg total) by mouth every 6 (six) hours as needed. Do not take more than 8 tablets in 24 hours 02/26/14   Blain Pais, MD  albuterol (PROVENTIL HFA;VENTOLIN HFA) 108 (90 BASE) MCG/ACT inhaler Inhale 1-2 puffs into the lungs every 6 (six) hours as needed for wheezing or shortness of breath. 01/24/14   Charlott Rakes, MD  amLODipine (NORVASC) 5 MG tablet Take 1 tablet (5 mg total) by mouth daily. 01/01/14 01/01/15  Charlott Rakes, MD  aspirin 81 MG tablet Take 81 mg by mouth every morning.     Historical Provider, MD  Blood Glucose Monitoring Suppl (ONE TOUCH ULTRA MINI) W/DEVICE KIT 1 Device by Does not apply route 2 (two) times daily. Sample provided to patient 01/24/14   Aldine Contes, MD  buPROPion (WELLBUTRIN XL) 150 MG 24 hr tablet Take 1 tablet (150 mg total) by mouth daily. 04/07/14   Jessee Avers, MD  butalbital-acetaminophen-caffeine (FIORICET, ESGIC) [phone] MG per tablet take 1  tablet by mouth at bedtime 06/09/14   Charlott Rakes, MD  DULoxetine (CYMBALTA) 60 MG capsule Take 1 capsule (60 mg total) by mouth every morning. 12/11/13   Charlott Rakes, MD  gabapentin (NEURONTIN) 600 MG tablet TAKE 1 AND 1/2 TABLETS BY MOUTH 3 TIMES DAILY 05/01/14   Charlott Rakes, MD  glipiZIDE (GLUCOTROL XL) 10 MG 24 hr tablet Take 1 tablet (10 mg total) by mouth daily with supper. 12/11/13   Charlott Rakes, MD  glucose blood (ONE TOUCH ULTRA TEST) test strip 1 each by Other route 2 (two) times daily. 04/11/14   Jessee Avers, MD  lisinopril (PRINIVIL,ZESTRIL) 40 MG tablet Take 1 tablet (40 mg total) by  mouth daily. 12/11/13   Charlott Rakes, MD  metformin (FORTAMET) 1000 MG (OSM) 24 hr tablet Take 1 tablet (1,000 mg total) by mouth daily with breakfast. 05/14/14   Osa Craver, MD  naproxen (NAPROSYN) 500 MG tablet Take 1 tablet (500 mg total) by mouth 2 (two) times daily with a meal. 05/14/14 05/14/15  Osa Craver, MD  ONETOUCH DELICA LANCETS FINE MISC Use to check blood sugar up to 1 time a  Day as instructed 01/24/14   Charlott Rakes, MD  pantoprazole (PROTONIX) 20 MG tablet Take 1 tablet (20 mg total) by mouth daily. 12/11/13   Charlott Rakes, MD  rosuvastatin (CRESTOR) 40 MG tablet Take 1 tablet (40 mg total) by mouth daily. 04/11/14   Jessee Avers, MD   BP 131/86 mmHg  Pulse 92  Temp(Src) 97.6 F (36.4 C) (Oral)  Resp 19  Ht 5' 6"  (1.676 m)  Wt 200 lb (90.719 kg)  BMI 32.30 kg/m2  SpO2 96% Physical Exam  Constitutional: She is oriented to person, place, and time. She appears well-developed and well-nourished. No distress.  HENT:  Head: Normocephalic and atraumatic.  Right Ear: Hearing normal.  Left Ear: Hearing normal.  Nose: Nose normal.  Mouth/Throat: Oropharynx is clear and moist and mucous membranes are normal.  Eyes: Conjunctivae and EOM are normal. Pupils are equal, round, and reactive to light.  Neck: Normal range of motion. Neck supple.  Cardiovascular: Regular rhythm, S1 normal and S2 normal.  Exam reveals no gallop and no friction rub.   No murmur heard. Pulmonary/Chest: Effort normal and breath sounds normal. No respiratory distress. She exhibits no tenderness.  Abdominal: Soft. Normal appearance and bowel sounds are normal. There is no hepatosplenomegaly. There is tenderness in the left upper quadrant. There is no rebound, no guarding, no tenderness at McBurney's point and negative Murphy's sign. No hernia.  Musculoskeletal: Normal range of motion.  Neurological: She is alert and oriented to person, place, and time. She has normal strength. No cranial nerve deficit or  sensory deficit. Coordination normal. GCS eye subscore is 4. GCS verbal subscore is 5. GCS motor subscore is 6.  Skin: Skin is warm, dry and intact. No rash noted. No cyanosis.  Psychiatric: She has a normal mood and affect. Her speech is normal and behavior is normal. Thought content normal.  Nursing note and vitals reviewed.   ED Course  Procedures (including critical care time) Labs Review Labs Reviewed  CBC WITH DIFFERENTIAL/PLATELET - Abnormal; Notable for the following:    Neutrophils Relative % 82 (*)    Neutro Abs 8.5 (*)    All other components within normal limits  COMPREHENSIVE METABOLIC PANEL - Abnormal; Notable for the following:    Glucose, Bld 325 (*)    Creatinine, Ser 1.23 (*)    GFR calc non  Af Amer 46 (*)    GFR calc Af Amer 54 (*)    All other components within normal limits  I-STAT CG4 LACTIC ACID, ED - Abnormal; Notable for the following:    Lactic Acid, Venous 2.75 (*)    All other components within normal limits  LIPASE, BLOOD  TROPONIN I  BRAIN NATRIURETIC PEPTIDE  URINALYSIS, ROUTINE W REFLEX MICROSCOPIC    Imaging Review Dg Chest 2 View  07/11/2014   CLINICAL DATA:  Chest pain, abdominal pain, nausea and vomiting.  EXAM: CHEST  2 VIEW  COMPARISON:  09/26/2013.  FINDINGS: The cardiac silhouette, mediastinal and hilar contours are within normal limits. There is mild tortuosity and calcification of the thoracic aorta. The lungs are clear. No pleural effusion. No pneumothorax. The bony thorax is intact. Remote surgical changes involving the left shoulder.  IMPRESSION: No acute cardiopulmonary findings.   Electronically Signed   By: Kalman Jewels M.D.   On: 07/11/2014 09:39   Ct Abdomen Pelvis W Contrast  07/11/2014   CLINICAL DATA:  Acute onset of left lower quadrant abdominal pain, nausea, vomiting and diarrhea yesterday. Surgical history includes cholecystectomy.  EXAM: CT ABDOMEN AND PELVIS WITH CONTRAST  TECHNIQUE: Multidetector CT imaging of the abdomen  and pelvis was performed using the standard protocol following bolus administration of intravenous contrast.  CONTRAST:  50m OMNIPAQUE IOHEXOL 300 MG/ML IV. Oral contrast was also administered.  COMPARISON:  CTA abdomen and pelvis 09/28/2013.  FINDINGS: Moderate stool burden in the rectum and distal sigmoid colon. Remainder of the colon decompressed. Scattered areas of mild colonic wall thickening. No visible colonic diverticula. Normal appendix in the right upper pelvis. Normal appearing stomach and small bowel. No ascites.  Diffuse hepatic steatosis without focal hepatic parenchymal abnormality, with improvement in the steatosis since the prior examination. Anatomic variant in that of the left lobe of the liver extends well across the midline to the left upper quadrant. Normal appearing spleen, pancreas, and adrenal glands. Gallbladder surgically absent. No unexpected biliary ductal dilation. Lobular contour to both kidneys consistent with persistent fetal lobulations, unchanged; kidneys otherwise unremarkable. Nonobstructing 3-4 mm calculus in the distal left ureter at the UVJ without evidence of hydronephrosis; this calculus has migrated from a mid calix of the left kidney since the prior examination. Moderate aortoiliofemoral atherosclerosis without aneurysm. Visceral arteries widely patent. No significant lymphadenopathy.  Uterus surgically absent. No adnexal masses or free pelvic fluid. Phleboliths low in the right side of the pelvis.  Bone window images demonstrate thoracolumbar scoliosis convex right, mild-to-moderate lower thoracic spondylosis. Severe degenerative disc disease, spondylosis and facet degenerative changes throughout the lumbar spine with move moderate to severe multilevel multifactorial spinal stenosis, worst at L4-5.  IMPRESSION: 1. Mild colitis.  No evidence of acute diverticulitis. 2. Nonobstructing 3-4 mm calculus at the left UVJ. 3. Diffuse hepatic steatosis without focal hepatic  parenchymal abnormality. 4. Osseous findings as above, most significantly multilevel spinal stenosis, worst at L4-5.   Electronically Signed   By: TEvangeline DakinM.D.   On: 07/11/2014 12:15     EKG Interpretation None      MDM   Final diagnoses:  Abdominal pain  Colitis    Patient presents to the ER for evaluation of abdominal pain. Patient had symptoms of nausea and vomiting onset yesterday, today has had diarrhea and left upper and lateral abdominal pain. Patient is afebrile. Vital signs are stable. Workup has revealed evidence of colitis. Patient was hydrated and administered analgesia with improvement. She was  initiated on Cipro and Flagyl, is appropriate for continued outpatient treatment, return if symptoms worsen. Follow-up with primary care next week, follow-up with GI as well.    Orpah Greek, MD 07/11/14 [phone]

## 2014-07-11 NOTE — ED Notes (Signed)
Pollina. MD aware of abnormal lab test results

## 2014-07-14 LAB — URINE CULTURE

## 2014-07-15 ENCOUNTER — Telehealth (HOSPITAL_BASED_OUTPATIENT_CLINIC_OR_DEPARTMENT_OTHER): Payer: Self-pay | Admitting: Emergency Medicine

## 2014-07-15 NOTE — Telephone Encounter (Signed)
Post ED Visit - Positive Culture Follow-up  Culture report reviewed by antimicrobial stewardship pharmacist: []  Wes Yaak, Pharm.D., BCPS [x]  Heide Guile, Pharm.D., BCPS []  Alycia Rossetti, Pharm.D., BCPS []  [place], Pharm.D., BCPS, AAHIVP []  Legrand Como, Pharm.D., BCPS, AAHIVP []  Isac Sarna, Pharm.D., BCPS  Positive urine culture E. Coli Treated with ciprofloxacin and metronidazole, organism sensitive to the same and no further patient follow-up is required at this time.  Hazle Nordmann 07/15/2014, 9:17 AM

## 2014-07-18 ENCOUNTER — Ambulatory Visit (INDEPENDENT_AMBULATORY_CARE_PROVIDER_SITE_OTHER): Payer: Medicare Other | Admitting: Internal Medicine

## 2014-07-18 ENCOUNTER — Encounter: Payer: Self-pay | Admitting: Internal Medicine

## 2014-07-18 VITALS — BP 139/81

## 2014-07-18 DIAGNOSIS — E1140 Type 2 diabetes mellitus with diabetic neuropathy, unspecified: Secondary | ICD-10-CM | POA: Diagnosis not present

## 2014-07-18 DIAGNOSIS — G44221 Chronic tension-type headache, intractable: Secondary | ICD-10-CM

## 2014-07-18 DIAGNOSIS — E785 Hyperlipidemia, unspecified: Secondary | ICD-10-CM | POA: Diagnosis not present

## 2014-07-18 DIAGNOSIS — K219 Gastro-esophageal reflux disease without esophagitis: Secondary | ICD-10-CM | POA: Diagnosis not present

## 2014-07-18 DIAGNOSIS — R51 Headache: Secondary | ICD-10-CM | POA: Diagnosis not present

## 2014-07-18 DIAGNOSIS — E1165 Type 2 diabetes mellitus with hyperglycemia: Secondary | ICD-10-CM

## 2014-07-18 DIAGNOSIS — IMO0002 Reserved for concepts with insufficient information to code with codable children: Secondary | ICD-10-CM

## 2014-07-18 LAB — GLUCOSE, CAPILLARY: Glucose-Capillary: 475 mg/dL — ABNORMAL HIGH (ref 70–99)

## 2014-07-18 LAB — POCT GLYCOSYLATED HEMOGLOBIN (HGB A1C): HEMOGLOBIN A1C: 8.7

## 2014-07-18 MED ORDER — BUTALBITAL-APAP-CAFFEINE 50-325-40 MG PO TABS: 1.0000 | ORAL_TABLET | Freq: Every day | ORAL | Status: DC

## 2014-07-18 MED ORDER — ROSUVASTATIN CALCIUM 40 MG PO TABS: 40.0000 mg | ORAL_TABLET | Freq: Every day | ORAL | Status: DC

## 2014-07-18 MED ORDER — METFORMIN HCL ER 750 MG PO TB24: 1500.0000 mg | ORAL_TABLET | Freq: Every day | ORAL | Status: DC

## 2014-07-18 MED ORDER — PANTOPRAZOLE SODIUM 20 MG PO TBEC: 20.0000 mg | DELAYED_RELEASE_TABLET | Freq: Every day | ORAL | Status: DC

## 2014-07-18 MED ORDER — GLIPIZIDE ER 10 MG PO TB24: 10.0000 mg | ORAL_TABLET | Freq: Every day | ORAL | Status: DC

## 2014-07-18 NOTE — Assessment & Plan Note (Addendum)
Lab Results  Component Value Date   HGBA1C 8.7 07/18/2014   HGBA1C 8.0 04/03/2014   HGBA1C 8.4 12/11/2013     Assessment: Diabetes control: fair control Progress toward A1C goal:  deteriorated Comments:  -Reports the abdominal pain that prompted her ED visit last week has since resolved -Has been out of her medication since January 25 -Also reports that her testing strips cost her $40 and thought she has been unable to check though called In to Ms. Butch Penny who told her that if she sent her in her Medicare card should be able to get them for much cheaper -Feels like stuffing her face for last few days -Also reports feeling not so well despite the resolution of her abdominal pain, notably of which were includes having to use the restroom more frequently  Plan: Medications:  Metformin 1000 mg extended release Home glucose monitoring: Frequency: no home glucose monitoring Timing: before breakfast Instruction/counseling given: reminded to bring blood glucose meter & log to each visit Educational resources provided:   Self management tools provided:   Other plans:  -Increase metformin from 1000 mg to 1500 mg -Follow-up in 1 month -Advised her to bring her glucometer as well as her blood sugar log -Emphasized to her the importance of working on controlling her blood sugars to prevent future complications -Complete foot exam today -Check urine microalbumin creatinine ratio

## 2014-07-18 NOTE — Patient Instructions (Signed)
Thank you for bringing your medicines today. This helps [place] keep you safe from mistakes.  Please take metformin 1500mg  [two tablets] with breakfast each morning.  For your Protonix, please take it 30 minutes before your meal.

## 2014-07-18 NOTE — Assessment & Plan Note (Signed)
Refill Protonix today

## 2014-07-18 NOTE — Assessment & Plan Note (Signed)
Refill Fioricet today

## 2014-07-18 NOTE — Progress Notes (Signed)
   Subjective:    Patient ID: Giliana Vantil, female    DOB: Oct 17, 1950, 64 y.o.   MRN: [id number]  HPI Ms. Stroud is a 64 year old female with HTN, DM2, MDD who presents today for a follow-up visit.  Please see assessment & plan for documentation of her problems.   Review of Systems  Constitutional: Negative for fever.  Respiratory: Negative for shortness of breath.   Cardiovascular: Negative for chest pain.  Gastrointestinal: Negative for nausea, abdominal pain and diarrhea.  Genitourinary: Positive for frequency. Negative for hematuria, flank pain and difficulty urinating.       Objective:   Physical Exam Constitutional: She is oriented to person, place, and time. She appears well-developed and well-nourished. No distress.  HENT:  Head: Normocephalic and atraumatic.  Eyes: Conjunctivae are normal. Pupils are equal, round, and reactive to light.  Cardiovascular: Questionable systolic ejection murmur best heard at the right upper sternal border Pulmonary/Chest: Effort normal. No respiratory distress. She has no wheezes. She has no rales.  Abdominal: Soft. Bowel sounds are normal. She exhibits no distension. There is no tenderness.  Neurological: She is alert and oriented to person, place, and time. No cranial nerve deficit. Coordination normal.  Skin: Skin is warm and dry. She is not diaphoretic.  Psychiatric: Her behavior is normal.        Assessment & Plan:

## 2014-07-18 NOTE — Assessment & Plan Note (Signed)
Refilled her statin today

## 2014-07-19 LAB — MICROALBUMIN / CREATININE URINE RATIO
Creatinine, Urine: 47.3 mg/dL
MICROALB/CREAT RATIO: 40.2 mg/g — AB (ref 0.0–30.0)
Microalb, Ur: 1.9 mg/dL (ref <2.0)

## 2014-07-21 ENCOUNTER — Telehealth: Payer: Self-pay | Admitting: *Deleted

## 2014-07-21 MED ORDER — FLUCONAZOLE 150 MG PO TABS: 150.0000 mg | ORAL_TABLET | ORAL | Status: DC

## 2014-07-21 NOTE — Progress Notes (Signed)
Medicine attending: Medical history, presenting problems, physical findings, and medications, reviewed with Dr Charlott Rakes on the day of the patient visit  and I concur with his evaluation and management plan.

## 2014-07-21 NOTE — Telephone Encounter (Signed)
I have sent for Diflucan 150 mg every 72 hours 3.

## 2014-07-21 NOTE — Telephone Encounter (Signed)
Pt called in with c/o yeast infection.  She wants the 3 day pills. She can't afford OTC cream so needs Rx She is taking Cipro for 10 days starting 1/29  Will  You write the Rx or do you want pt to be seen?

## 2014-08-08 DIAGNOSIS — H5310 Unspecified subjective visual disturbances: Secondary | ICD-10-CM | POA: Diagnosis not present

## 2014-08-08 DIAGNOSIS — H04123 Dry eye syndrome of bilateral lacrimal glands: Secondary | ICD-10-CM | POA: Diagnosis not present

## 2014-08-08 DIAGNOSIS — H2513 Age-related nuclear cataract, bilateral: Secondary | ICD-10-CM | POA: Diagnosis not present

## 2014-08-15 ENCOUNTER — Encounter: Payer: Self-pay | Admitting: Internal Medicine

## 2014-08-15 ENCOUNTER — Ambulatory Visit (INDEPENDENT_AMBULATORY_CARE_PROVIDER_SITE_OTHER): Payer: Medicare Other | Admitting: Internal Medicine

## 2014-08-15 VITALS — BP 131/71 | HR 96 | Temp 97.7°F | Ht 66.0 in | Wt 198.2 lb

## 2014-08-15 DIAGNOSIS — E785 Hyperlipidemia, unspecified: Secondary | ICD-10-CM

## 2014-08-15 DIAGNOSIS — F329 Major depressive disorder, single episode, unspecified: Secondary | ICD-10-CM

## 2014-08-15 DIAGNOSIS — F331 Major depressive disorder, recurrent, moderate: Secondary | ICD-10-CM

## 2014-08-15 DIAGNOSIS — F17210 Nicotine dependence, cigarettes, uncomplicated: Secondary | ICD-10-CM | POA: Diagnosis not present

## 2014-08-15 DIAGNOSIS — E1140 Type 2 diabetes mellitus with diabetic neuropathy, unspecified: Secondary | ICD-10-CM

## 2014-08-15 DIAGNOSIS — E1165 Type 2 diabetes mellitus with hyperglycemia: Secondary | ICD-10-CM | POA: Diagnosis not present

## 2014-08-15 DIAGNOSIS — IMO0002 Reserved for concepts with insufficient information to code with codable children: Secondary | ICD-10-CM

## 2014-08-15 LAB — GLUCOSE, CAPILLARY: Glucose-Capillary: 376 mg/dL — ABNORMAL HIGH (ref 70–99)

## 2014-08-15 MED ORDER — ROSUVASTATIN CALCIUM 40 MG PO TABS
40.0000 mg | ORAL_TABLET | Freq: Every day | ORAL | Status: DC
Start: 2014-08-15 — End: 2015-04-08

## 2014-08-15 MED ORDER — BUPROPION HCL ER (XL) 150 MG PO TB24: 150.0000 mg | ORAL_TABLET | Freq: Every day | ORAL | Status: DC

## 2014-08-15 NOTE — Patient Instructions (Signed)
Try to avoid the sugary foods!  Once you get your strips, please check your sugars at least once a day at the same time each day and bring your meter with you to your next visit.     General Instructions:   Thank you for bringing your medicines today. This helps [place] keep you safe from mistakes.   Progress Toward Treatment Goals:  Treatment Goal 07/18/2014  Hemoglobin A1C deteriorated  Blood pressure improved  Stop smoking unable to assess  Prevent falls -    Self Care Goals & Plans:  Self Care Goal 05/14/2014  Manage my medications bring my medications to every visit; take my medicines as prescribed; refill my medications on time  Monitor my health -  Eat healthy foods drink diet soda or water instead of juice or soda; eat more vegetables; eat foods that are low in salt; eat baked foods instead of fried foods; eat fruit for snacks and desserts  Be physically active -  Stop smoking -  Prevent falls -  Other -    Home Blood Glucose Monitoring 07/18/2014  Check my blood sugar no home glucose monitoring  When to check my blood sugar before breakfast     Care Management & Community Referrals:  Referral 12/11/2013  Referrals made for care management support none needed  Referrals made to community resources -

## 2014-08-15 NOTE — Progress Notes (Signed)
Patient ID: [name], female   DOB: [birth year], 64 y.o.   MRN: [id number]  Subjective:   Patient ID: [name] female   DOB: [birth year] 64 y.o.   MRN: [id number]  HPI: [name] is a 64 y.o. F w/ PMH HTN, DM2, and HLD presents for a routine f/u.  She was seen by her PCP 1 month ago and her Metformin was increased at that time. She is not checking her CBGs at home b/c she cannot afford the strips.   Her CBG is 376 in the clinic today. She states that she ate a toast with jelly on it today.   She states that she needs refills of her Crestor and Bupropion.   Past Medical History  Diagnosis Date  . Hypertension   . Diabetes mellitus without complication     diagnosed around 2010, only ever on metformin  . Chronic pain     neck pain, headache, neuropathy  . Hypertriglyceridemia   . Neuromuscular disorder   . COPD (chronic obstructive pulmonary disease)   . CHF (congestive heart failure)   . Depression   . Puncture wound of foot, right 05/17/2012    Tetanus shot 3 yrs ago at [facility] in [place], per pt report   . Brachial plexus disorders    Current Outpatient Prescriptions  Medication Sig Dispense Refill  . acetaminophen (TYLENOL) 500 MG tablet Take 1 tablet (500 mg total) by mouth every 6 (six) hours as needed. Do not take more than 8 tablets in 24 hours 30 tablet 0  . albuterol (PROVENTIL HFA;VENTOLIN HFA) 108 (90 BASE) MCG/ACT inhaler Inhale 1-2 puffs into the lungs every 6 (six) hours as needed for wheezing or shortness of breath. 1 Inhaler 3  . amLODipine (NORVASC) 5 MG tablet Take 1 tablet (5 mg total) by mouth daily. 30 tablet 3  . aspirin 81 MG tablet Take 81 mg by mouth every morning.     . Blood Glucose Monitoring Suppl (ONE TOUCH ULTRA MINI) W/DEVICE KIT 1 Device by Does not apply route 2 (two) times daily. Sample provided to patient 1 each 0  . buPROPion (WELLBUTRIN XL) 150 MG 24 hr tablet Take 1 tablet (150 mg total) by mouth daily. 30 tablet 1  .  butalbital-acetaminophen-caffeine (FIORICET, ESGIC) 50-325-40 MG per tablet Take 1 tablet by mouth at bedtime. 30 tablet 5  . ciprofloxacin (CIPRO) 500 MG tablet Take 1 tablet (500 mg total) by mouth 2 (two) times daily. 20 tablet 0  . DULoxetine (CYMBALTA) 60 MG capsule Take 1 capsule (60 mg total) by mouth every morning. 90 capsule 3  . fluconazole (DIFLUCAN) 150 MG tablet Take 1 tablet (150 mg total) by mouth every 3 (three) days. 3 tablet 0  . gabapentin (NEURONTIN) 600 MG tablet TAKE 1 AND 1/2 TABLETS BY MOUTH 3 TIMES DAILY 405 tablet 1  . glipiZIDE (GLUCOTROL XL) 10 MG 24 hr tablet Take 1 tablet (10 mg total) by mouth daily with supper. 90 tablet 3  . glucose blood (ONE TOUCH ULTRA TEST) test strip 1 each by Other route 2 (two) times daily. 50 each 5  . HYDROcodone-acetaminophen (NORCO/VICODIN) 5-325 MG per tablet Take 1 tablet by mouth every 4 (four) hours as needed for moderate pain. 20 tablet 0  . lisinopril (PRINIVIL,ZESTRIL) 40 MG tablet Take 1 tablet (40 mg total) by mouth daily. 90 tablet 3  . metformin (GLUCOPHAGE-XR) 750 MG 24 hr tablet Take 2 tablets (1,500 mg total) by mouth daily with breakfast. 60 tablet  6  . ondansetron (ZOFRAN) 4 MG tablet Take 1 tablet (4 mg total) by mouth every 6 (six) hours. 30 tablet 0  . ONETOUCH DELICA LANCETS FINE MISC Use to check blood sugar up to 1 time a  Day as instructed 100 each 12  . pantoprazole (PROTONIX) 20 MG tablet Take 1 tablet (20 mg total) by mouth daily. 90 tablet 3  . rosuvastatin (CRESTOR) 40 MG tablet Take 1 tablet (40 mg total) by mouth daily. 30 tablet 6   No current facility-administered medications for this visit.   Family History  Problem Relation Age of Onset  . Hyperlipidemia Mother   . Heart attack Father 42  . Hypertension Father   . Cancer Paternal Grandfather     Lung cancer  . Cancer Maternal Grandmother   . Heart attack Maternal Grandmother    History   Social History  . Marital Status: Widowed    Spouse  Name: N/A  . Number of Children: N/A  . Years of Education: 14   Occupational History  . unemployed    Social History Main Topics  . Smoking status: Current Every Day Smoker -- 0.50 packs/day for 41 years    Types: Cigarettes  . Smokeless tobacco: Never Used     Comment: Trying to cut back.  . Alcohol Use: No  . Drug Use: No  . Sexual Activity: Not on file   Other Topics Concern  . None   Social History Narrative   Moved to [facility] from [place] 2012-05-03, shortly after the death of her husband, to live with her daughter.  She notes significant life stress related to her 54 year-old grand-daughter with bipolar disorder.  She has previously worked as a Occupational psychologist.   Review of Systems: Constitutional: Denies fever, chills HEENT: Denies eye pain, acute vision changes congestion.   Respiratory: Denies SOB, DOE Cardiovascular: Denies chest pain or leg swelling.  Gastrointestinal: Denies nausea, vomiting, abdominal pain, diarrhea, constipation Genitourinary: Denies dysuria, urgency, frequency Musculoskeletal: Denies myalgias or gait problem.  Skin: Denies rash or wound.  Neurological: Denies dizziness, syncope, or headaches.  Psychiatric/Behavioral: Denies suicidal ideation, mood changes, confusion   Objective:  Physical Exam: Filed Vitals:   08/15/14 1104  BP: 131/71  Pulse: 96  Temp: 97.7 F (36.5 C)  TempSrc: Oral  Height: _0  (1.676 m)  Weight: 198 lb 3.2 oz (89.903 kg)  SpO2: 96%   Constitutional: Vital signs reviewed.  Patient is a well-developed and well-nourished female in no acute distress and cooperative with exam. Alert and oriented x3.  Head: Normocephalic and atraumatic Eyes: EOMI. No scleral icterus.  Neck: Normal ROM Cardiovascular: RRR, no MRG Pulmonary/Chest: Normal respiratory effort, CTAB, no wheezes, rales, or rhonchi Abdominal: Soft. Non-tender, non-distended. Musculoskeletal: No joint deformities Neurological: A&O x3, cranial nerve II-XII  are grossly intact, no focal motor deficit.  Skin: Warm, dry and intact.   Psychiatric: Normal mood and affect. Speech and behavior is normal.   Assessment & Plan:   Please refer to Problem List based Assessment and Plan

## 2014-08-15 NOTE — Assessment & Plan Note (Signed)
Refilled bupropion

## 2014-08-15 NOTE — Assessment & Plan Note (Signed)
Refilled Crestor. 

## 2014-08-15 NOTE — Assessment & Plan Note (Addendum)
Lab Results  Component Value Date   HGBA1C 8.7 07/18/2014   HGBA1C 8.0 04/03/2014   HGBA1C 8.4 12/11/2013     Assessment: Diabetes control: fair control Progress toward A1C goal:  unchanged Comments: Metformin increased to 1500mg  daily. Pt not checking CBGs at home. She ate toast with regular jelly this morning and her CBG was 376 in the clinic.   Plan: Medications:  continue current medications Home glucose monitoring: Frequency:   Timing:   Instruction/counseling given: reminded to bring blood glucose meter & log to each visit and discussed diet Educational resources provided:   Self management tools provided:   Other plans: Check CBGs at least once a day at the same time each day F/u in 2 weeks with PCP and bring meter. Consider checking BMP at next visit to assess her renal function of the Metformin; last Cr was 1.25 in January.

## 2014-08-19 NOTE — Progress Notes (Signed)
Internal Medicine Clinic Attending  Case discussed with Dr. Glenn soon after the resident saw the patient.  We reviewed the resident's history and exam and pertinent patient test results.  I agree with the assessment, diagnosis, and plan of care documented in the resident's note. 

## 2014-09-09 ENCOUNTER — Other Ambulatory Visit: Payer: Self-pay | Admitting: Dietician

## 2014-09-09 DIAGNOSIS — IMO0002 Reserved for concepts with insufficient information to code with codable children: Secondary | ICD-10-CM

## 2014-09-09 DIAGNOSIS — E1142 Type 2 diabetes mellitus with diabetic polyneuropathy: Secondary | ICD-10-CM

## 2014-09-09 DIAGNOSIS — E1140 Type 2 diabetes mellitus with diabetic neuropathy, unspecified: Secondary | ICD-10-CM

## 2014-09-09 DIAGNOSIS — E1165 Type 2 diabetes mellitus with hyperglycemia: Secondary | ICD-10-CM

## 2014-09-09 NOTE — Telephone Encounter (Signed)
Patient called requesting assistance in obtaining her diabetes testing supplies. They need prescriptions and their "VWO" form from [place].  Medical records will look for fax that [facility] says they will refax today.

## 2014-09-12 ENCOUNTER — Telehealth: Payer: Self-pay | Admitting: Internal Medicine

## 2014-09-12 NOTE — Telephone Encounter (Signed)
Call to patient to confirm appointment for 09/15/14 at 10:15 vm not set up

## 2014-09-12 NOTE — Telephone Encounter (Signed)
I looked in my mailbox yesterday and did not see any paperwork and looked again just now. I am off today and will track back on Monday.

## 2014-09-12 NOTE — Telephone Encounter (Signed)
I'll put it in your box today. Can you send the rxs in this note as well? Patient says she'd like to check her blood sugar twice daily. They have the updated ICD 10s. She was informed about the delay.

## 2014-09-15 ENCOUNTER — Ambulatory Visit: Payer: Medicare Other | Admitting: Internal Medicine

## 2014-09-15 MED ORDER — ONETOUCH DELICA LANCETS FINE MISC: Status: DC

## 2014-09-15 MED ORDER — GLUCOSE BLOOD VI STRP: ORAL_STRIP | Status: DC

## 2014-09-15 NOTE — Telephone Encounter (Signed)
That's appropriate given her A1c. I have completed the form for submission. Thank you for your help.

## 2014-09-22 ENCOUNTER — Ambulatory Visit (HOSPITAL_COMMUNITY)
Admission: RE | Admit: 2014-09-22 | Discharge: 2014-09-22 | Disposition: A | Discharge status: Home / Self Care | Payer: Medicare Other | Source: Ambulatory Visit | Attending: Internal Medicine | Admitting: Internal Medicine

## 2014-09-22 ENCOUNTER — Ambulatory Visit (INDEPENDENT_AMBULATORY_CARE_PROVIDER_SITE_OTHER): Payer: Medicare Other | Admitting: Internal Medicine

## 2014-09-22 ENCOUNTER — Ambulatory Visit: Payer: Medicare Other | Admitting: Internal Medicine

## 2014-09-22 VITALS — BP 124/74 | HR 97 | Temp 98.0°F | Wt 196.3 lb

## 2014-09-22 DIAGNOSIS — M25562 Pain in left knee: Secondary | ICD-10-CM | POA: Diagnosis not present

## 2014-09-22 DIAGNOSIS — I10 Essential (primary) hypertension: Secondary | ICD-10-CM

## 2014-09-22 DIAGNOSIS — M7522 Bicipital tendinitis, left shoulder: Secondary | ICD-10-CM | POA: Diagnosis not present

## 2014-09-22 DIAGNOSIS — E1165 Type 2 diabetes mellitus with hyperglycemia: Secondary | ICD-10-CM | POA: Diagnosis not present

## 2014-09-22 DIAGNOSIS — IMO0002 Reserved for concepts with insufficient information to code with codable children: Secondary | ICD-10-CM

## 2014-09-22 DIAGNOSIS — M179 Osteoarthritis of knee, unspecified: Secondary | ICD-10-CM | POA: Diagnosis not present

## 2014-09-22 DIAGNOSIS — M1712 Unilateral primary osteoarthritis, left knee: Secondary | ICD-10-CM | POA: Insufficient documentation

## 2014-09-22 DIAGNOSIS — F331 Major depressive disorder, recurrent, moderate: Secondary | ICD-10-CM

## 2014-09-22 DIAGNOSIS — E1140 Type 2 diabetes mellitus with diabetic neuropathy, unspecified: Secondary | ICD-10-CM

## 2014-09-22 DIAGNOSIS — F17210 Nicotine dependence, cigarettes, uncomplicated: Secondary | ICD-10-CM | POA: Diagnosis not present

## 2014-09-22 DIAGNOSIS — F329 Major depressive disorder, single episode, unspecified: Secondary | ICD-10-CM | POA: Diagnosis not present

## 2014-09-22 LAB — BASIC METABOLIC PANEL WITH GFR
BUN: 17 mg/dL (ref 6–23)
CHLORIDE: 107 meq/L (ref 96–112)
CO2: 23 mEq/L (ref 19–32)
Calcium: 9.7 mg/dL (ref 8.4–10.5)
Creat: 0.76 mg/dL (ref 0.50–1.10)
GFR, EST NON AFRICAN AMERICAN: 85 mL/min
GFR, Est African American: >89 mL/min
Glucose, Bld: 140 mg/dL — ABNORMAL HIGH (ref 70–99)
POTASSIUM: 4.7 meq/L (ref 3.5–5.3)
Sodium: 139 mEq/L (ref 135–145)

## 2014-09-22 NOTE — Progress Notes (Signed)
Internal Medicine Clinic Attending  Case discussed with Dr. Hoffman soon after the resident saw the patient.  We reviewed the resident's history and exam and pertinent patient test results.  I agree with the assessment, diagnosis, and plan of care documented in the resident's note. 

## 2014-09-22 NOTE — Assessment & Plan Note (Signed)
-   From exam I suspect left biceps tendinitis, I instructed her that the NSAIDs for her knee will be helpful and this will likely improve with time.

## 2014-09-22 NOTE — Assessment & Plan Note (Signed)
BP Readings from Last 3 Encounters:  09/22/14 124/74  08/15/14 131/71  07/18/14 139/81    Lab Results  Component Value Date   NA 139 09/22/2014   K 4.7 09/22/2014   CREATININE 0.76 09/22/2014    Assessment: Blood pressure control:  controlled Progress toward BP goal:   at goal Comments:   Plan: Medications:  Continue lisinopril 40mg  dialy and amlodipine 5mg  daily Educational resources provided:   Self management tools provided:   Other plans: Recheck BMP today

## 2014-09-22 NOTE — Assessment & Plan Note (Signed)
-   She has not been taking wellbutrin. - We have confirmed that the pharmacy has her prescription.  She will restart Wellbutrin.

## 2014-09-22 NOTE — Patient Instructions (Signed)
General Instructions: Please go and get the Xray of your left knee.  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  You can take up to 12 pills of Ibuprofen a day.  We will check you kidney function today.  We will see you back in about 2 months unless you need to be seen sooner.  Please bring your medicines with you each time you come to clinic.  Medicines may include prescription medications, over-the-counter medications, herbal remedies, eye drops, vitamins, or other pills.   Progress Toward Treatment Goals:  Treatment Goal 08/15/2014  Hemoglobin A1C unchanged  Blood pressure -  Stop smoking -  Prevent falls -    Self Care Goals & Plans:  Self Care Goal 05/14/2014  Manage my medications bring my medications to every visit; take my medicines as prescribed; refill my medications on time  Monitor my health -  Eat healthy foods drink diet soda or water instead of juice or soda; eat more vegetables; eat foods that are low in salt; eat baked foods instead of fried foods; eat fruit for snacks and desserts  Be physically active -  Stop smoking -  Prevent falls -  Other -    Home Blood Glucose Monitoring 07/18/2014  Check my blood sugar no home glucose monitoring  When to check my blood sugar before breakfast     Care Management & Community Referrals:  Referral 12/11/2013  Referrals made for care management support none needed  Referrals made to community resources -

## 2014-09-22 NOTE — Assessment & Plan Note (Signed)
Lab Results  Component Value Date   HGBA1C 8.7 07/18/2014   HGBA1C 8.0 04/03/2014   HGBA1C 8.4 12/11/2013     Assessment: Diabetes control:  uncontrolled Progress toward A1C goal:   fair Comments: ran out of testing supplies  Plan: Medications:  Continue Metfromin XR 750 BID, and Glipizide XL 10mg  daily. Home glucose monitoring: Frequency:   Timing:   Instruction/counseling given: reminded to bring blood glucose meter & log to each visit and reminded to bring medications to each visit Educational resources provided:   Self management tools provided:   Other plans: I am hesitant to make any acute changes with her medications until she resumes checking her blood sugars.

## 2014-09-22 NOTE — Assessment & Plan Note (Signed)
-   Patient notes 3 weeks of left knee pain that has gotten worse with extra use with taking care of her grandson. - From history and exam I suspect OA of her knee. - We have no imaging on file will obtain an Xray - I offered her a steroid joint injection after Xray was obtained but she declined as she noted they were previously ineffective in treated shoulder pain. - Will have her continue NSAIDs for pain and patient was instructed to stay active.  Will recheck her renal function today to make sure she can tolerate chronic NSAID therapy.

## 2014-09-22 NOTE — Progress Notes (Signed)
[facility] Subjective:   Patient ID: [name] female   DOB: May 30, 1951 64 y.o.   MRN: [id number]  HPI: [name] is a 64 y.o. female with a PMH detailed below who presents for 1 month for diabetes follow up.  Diabetes:  Last A1c 8.7 om Feb.  Takes glipizide 63m daily, metformin 7589mBID. On gabapentin for diabetic peripheral neuropathy.  She has not been checking her sugars because she changed insurances and there has been a delay in getting her strips.  She is told they now have them avalaible at her phamacy.  Left knee pain  She reports pain at the left medial aspect of the knee. She reports pain with standing, poping sensation when she is walking and pain trying to flex her knee.   This has been going on for 2 weeks.  She reports a history of numerous falls but none recently.  She does note that she has spent more time on her knees playing with her grandson.  She reports no right knee pain.  She has taken ibuprofen 3 pills BID for her pain with only minimal relief.  She has never had the knee xrayed.  Depression: started on welburtrin on last visit, she did not apparently realize this and never pick it up from pharmacy.  No thoughts of hurting herself.  She also reports some left shoulder pain, this has been going on for about 2-3 weeks.  This is worse with picking up and carrying things like her grandson.  She denies any trauma to the area.  She previously has had a rotator cuff tear of her left shoulder.  Past Medical History  Diagnosis Date  . Hypertension   . Diabetes mellitus without complication     diagnosed around 2010, only ever on metformin  . Chronic pain     neck pain, headache, neuropathy  . Hypertriglyceridemia   . Neuromuscular disorder   . COPD (chronic obstructive pulmonary disease)   . CHF (congestive heart failure)   . Depression   . Puncture wound of foot, right 05/17/2012    [facility] [place] [facility] [place]   . Brachial plexus disorders    Current Outpatient Prescriptions  Medication Sig Dispense Refill  . albuterol (PROVENTIL HFA;VENTOLIN HFA) 108 (90 BASE) MCG/ACT inhaler Inhale 1-2 puffs into the lungs every 6 (six) hours as needed for wheezing or shortness of breath. 1 Inhaler 3  . amLODipine (NORVASC) 5 MG tablet Take 1 tablet (5 mg total) by mouth daily. 30 tablet 3  . aspirin 81 MG tablet Take 81 mg by mouth every morning.     . butalbital-acetaminophen-caffeine (FIORICET, ESGIC) 50-325-40 MG per tablet Take 1 tablet by mouth at bedtime. 30 tablet 5  . DULoxetine (CYMBALTA) 60 MG capsule Take 1 capsule (60 mg total) by mouth every morning. 90 capsule 3  . gabapentin (NEURONTIN) 600 MG tablet TAKE 1 AND 1/2 TABLETS BY MOUTH 3 TIMES DAILY 405 tablet 1  . glipiZIDE (GLUCOTROL XL) 10 MG 24 hr tablet Take 1 tablet (10 mg total) by mouth daily with supper. 90 tablet 3  . lisinopril (PRINIVIL,ZESTRIL) 40 MG tablet Take 1 tablet (40 mg total) by mouth daily. 90 tablet 3  . metformin (GLUCOPHAGE-XR) 750 MG 24 hr tablet Take 2 tablets (1,500 mg total) by mouth daily with breakfast. 60 tablet 6  . pantoprazole (PROTONIX) 20 MG tablet Take 1 tablet (20 mg total) by mouth daily.  90 tablet 3  . rosuvastatin (CRESTOR) 40 MG tablet Take 1 tablet (40 mg total) by mouth daily. 30 tablet 11  . acetaminophen (TYLENOL) 500 MG tablet Take 1 tablet (500 mg total) by mouth every 6 (six) hours as needed. Do not take more than 8 tablets in 24 hours (Patient not taking: Reported on 08/15/2014) 30 tablet 0  . Blood Glucose Monitoring Suppl (ONE TOUCH ULTRA MINI) W/DEVICE KIT 1 Device by Does not apply route 2 (two) times daily. Sample provided to patient 1 each 0  . buPROPion (WELLBUTRIN XL) 150 MG 24 hr tablet Take 1 tablet (150 mg total) by mouth daily. (Patient not taking: Reported on 09/22/2014) 30 tablet 3  . glucose blood (ONE TOUCH ULTRA TEST) test strip Check blood sugar two times a day as instructed  50 each 5  . ondansetron (ZOFRAN) 4 MG tablet Take 1 tablet (4 mg total) by mouth every 6 (six) hours. 30 tablet 0  . ONETOUCH DELICA LANCETS FINE MISC Check blood sugar up to 2 times a  Day as instructed 50 each 5   No current facility-administered medications for this visit.   Family History  Problem Relation Age of Onset  . Hyperlipidemia Mother   . Heart attack Father 75  . Hypertension Father   . Cancer Paternal Grandfather     Lung cancer  . Cancer Maternal Grandmother   . Heart attack Maternal Grandmother    History   Social History  . Marital Status: Widowed    Spouse Name: N/A  . Number of Children: N/A  . Years of Education: 14   Occupational History  . unemployed    Social History Main Topics  . Smoking status: Current Every Day Smoker -- 0.50 packs/day for 41 years    Types: Cigarettes  . Smokeless tobacco: Never Used     Comment: Trying to cut back.  . Alcohol Use: No  . Drug Use: No  . Sexual Activity: Not on file   Other Topics Concern  . Not on file   Social History Narrative   Moved to [place] from [place], shortly after the death of her husband, to live with her daughter.  She notes significant life stress related to her 4 year-old grand-daughter with bipolar disorder.  She has previously worked as a Occupational psychologist.   Review of Systems: Review of Systems  Constitutional: Negative for fever, chills, weight loss and malaise/fatigue.  Eyes: Negative for blurred vision.  Respiratory: Negative for cough and shortness of breath.   Cardiovascular: Negative for chest pain and leg swelling.  Gastrointestinal: Negative for heartburn and abdominal pain.  Genitourinary: Negative for dysuria.  Musculoskeletal: Positive for joint pain and falls. Negative for myalgias.  Neurological: Negative for dizziness and headaches.  Endo/Heme/Allergies: Negative for polydipsia.  Psychiatric/Behavioral: Positive for depression. Negative for suicidal ideas.      Objective:  Physical Exam: Filed Vitals:   09/22/14 0952  BP: 124/74  Pulse: 97  Temp: 98 F (36.7 C)  TempSrc: Oral  Weight: 196 lb 4.8 oz (89.041 kg)  SpO2: 97%  Physical Exam  Musculoskeletal:       Left shoulder: She exhibits tenderness (bicipital grouve). She exhibits normal range of motion (full active ROM), no swelling, no effusion and no crepitus.       Left knee: She exhibits normal range of motion, no swelling, no effusion, no LCL laxity, normal patellar mobility and no MCL laxity. Tenderness found. Medial joint line tenderness noted. No  lateral joint line, no MCL and no LCL tenderness noted.    Assessment & Plan:  Case discussed with Dr. Ellwood Dense  Depression, major - She has not been taking wellbutrin. - We have confirmed that the pharmacy has her prescription.  She will restart Wellbutrin.   Left knee pain - Patient notes 3 weeks of left knee pain that has gotten worse with extra use with taking care of her grandson. - From history and exam I suspect OA of her knee. - We have no imaging on file will obtain an Xray - I offered her a steroid joint injection after Xray was obtained but she declined as she noted they were previously ineffective in treated shoulder pain. - Will have her continue NSAIDs for pain and patient was instructed to stay active.  Will recheck her renal function today to make sure she can tolerate chronic NSAID therapy.   Biceps tendinitis on left - From exam I suspect left biceps tendinitis, I instructed her that the NSAIDs for her knee will be helpful and this will likely improve with time.   Type 2 diabetes, uncontrolled, with neuropathy Lab Results  Component Value Date   HGBA1C 8.7 07/18/2014   HGBA1C 8.0 04/03/2014   HGBA1C 8.4 12/11/2013     Assessment: Diabetes control:  uncontrolled Progress toward A1C goal:   fair Comments: ran out of testing supplies  Plan: Medications:  Continue Metfromin XR 750 BID, and Glipizide XL 56m  daily. Home glucose monitoring: Frequency:   Timing:   Instruction/counseling given: reminded to bring blood glucose meter & log to each visit and reminded to bring medications to each visit Educational resources provided:   Self management tools provided:   Other plans: I am hesitant to make any acute changes with her medications until she resumes checking her blood sugars.      Hypertension BP Readings from Last 3 Encounters:  09/22/14 124/74  08/15/14 131/71  07/18/14 139/81    Lab Results  Component Value Date   NA 139 09/22/2014   K 4.7 09/22/2014   CREATININE 0.76 09/22/2014    Assessment: Blood pressure control:  controlled Progress toward BP goal:   at goal Comments:   Plan: Medications:  Continue lisinopril 457mdialy and amlodipine 67m82maily Educational resources provided:   Self management tools provided:   Other plans: Recheck BMP today      Medications Ordered No orders of the defined types were placed in this encounter.   Other Orders Orders Placed This Encounter  Procedures  . DG Knee Complete 4 Views Left    Standing Status: Future     Number of Occurrences: 1     Standing Expiration Date: 11/22/2015    Order Specific Question:  Reason for Exam (SYMPTOM  OR DIAGNOSIS REQUIRED)    Answer:  2 weeks of left knee pain, pain on medial aspect of tibial peateu    Order Specific Question:  Preferred imaging location?    Answer:  [facility] BMP with Estimated GFR ([id number]

## 2014-09-23 ENCOUNTER — Encounter: Payer: Self-pay | Admitting: Internal Medicine

## 2014-10-02 ENCOUNTER — Encounter: Payer: Self-pay | Admitting: Internal Medicine

## 2014-11-03 ENCOUNTER — Other Ambulatory Visit: Payer: Self-pay | Admitting: Internal Medicine

## 2014-11-03 NOTE — Telephone Encounter (Signed)
As this patient was seen by me on 07/18/14 and deemed necessary for their treatment as documented in the EHR, I will refill this prescription for gabapentin 600mg  TID

## 2014-11-12 ENCOUNTER — Telehealth: Payer: Self-pay | Admitting: Internal Medicine

## 2014-11-12 NOTE — Telephone Encounter (Signed)
Call to patient to confirm appointment for 11/13/14 at 9:45 vm not set up

## 2014-11-13 ENCOUNTER — Ambulatory Visit (INDEPENDENT_AMBULATORY_CARE_PROVIDER_SITE_OTHER): Payer: Medicare Other | Admitting: Internal Medicine

## 2014-11-13 ENCOUNTER — Encounter: Payer: Self-pay | Admitting: Internal Medicine

## 2014-11-13 VITALS — BP 142/68 | HR 87 | Temp 98.3°F | Ht 66.0 in | Wt 196.0 lb

## 2014-11-13 DIAGNOSIS — E1140 Type 2 diabetes mellitus with diabetic neuropathy, unspecified: Secondary | ICD-10-CM

## 2014-11-13 DIAGNOSIS — E1165 Type 2 diabetes mellitus with hyperglycemia: Secondary | ICD-10-CM

## 2014-11-13 DIAGNOSIS — I10 Essential (primary) hypertension: Secondary | ICD-10-CM

## 2014-11-13 DIAGNOSIS — F17210 Nicotine dependence, cigarettes, uncomplicated: Secondary | ICD-10-CM

## 2014-11-13 DIAGNOSIS — IMO0002 Reserved for concepts with insufficient information to code with codable children: Secondary | ICD-10-CM

## 2014-11-13 DIAGNOSIS — M1712 Unilateral primary osteoarthritis, left knee: Secondary | ICD-10-CM

## 2014-11-13 DIAGNOSIS — Z79899 Other long term (current) drug therapy: Secondary | ICD-10-CM

## 2014-11-13 LAB — GLUCOSE, CAPILLARY: GLUCOSE-CAPILLARY: 233 mg/dL — AB (ref 65–99)

## 2014-11-13 LAB — POCT GLYCOSYLATED HEMOGLOBIN (HGB A1C): Hemoglobin A1C: 7.6

## 2014-11-13 MED ORDER — SITAGLIPTIN PHOSPHATE 100 MG PO TABS: 100.0000 mg | ORAL_TABLET | Freq: Every day | ORAL | Status: DC

## 2014-11-13 NOTE — Patient Instructions (Signed)
General Instructions:   Thank you for bringing your medicines today. This helps [place] keep you safe from mistakes.   Progress Toward Treatment Goals:  Treatment Goal 11/13/2014  Hemoglobin A1C improved  Blood pressure deteriorated  Stop smoking stopped smoking  Prevent falls at goal    Self Care Goals & Plans:  Self Care Goal 11/13/2014  Manage my medications take my medicines as prescribed; bring my medications to every visit; refill my medications on time  Monitor my health keep track of my blood glucose; bring my glucose meter and log to each visit  Eat healthy foods drink diet soda or water instead of juice or soda; eat more vegetables; eat foods that are low in salt; eat baked foods instead of fried foods  Be physically active take a walk every day  Stop smoking -  Prevent falls -  Other -    Home Blood Glucose Monitoring 11/13/2014  Check my blood sugar 2 times a day  When to check my blood sugar before breakfast; before dinner     Care Management & Community Referrals:  Referral 11/13/2014  Referrals made for care management support none needed  Referrals made to community resources -    Sitagliptin oral tablet What is this medicine? SITAGLIPTIN (sit a GLIP tin) helps to treat type 2 diabetes. It helps to control blood sugar. Treatment is combined with diet and exercise. This medicine may be used for other purposes; ask your health care provider or pharmacist if you have questions. COMMON BRAND NAME(S): Januvia What should I tell my health care provider before I take this medicine? They need to know if you have any of these conditions: -diabetic ketoacidosis -kidney disease -pancreatitis -previous swelling of the tongue, face, or lips with difficulty breathing, difficulty swallowing, hoarseness, or tightening of the throat -type 1 diabetes -an unusual or allergic reaction to sitagliptin, other medicines, foods, dyes, or preservatives -pregnant or trying to get  pregnant -breast-feeding How should I use this medicine? Take this medicine by mouth with a glass of water. Follow the directions on the prescription label. You can take it with or without food. Do not cut, crush or chew this medicine. Take your dose at the same time each day. Do not take more often than directed. Do not stop taking except on your doctor's advice. Talk to your pediatrician regarding the use of this medicine in children. Special care may be needed. Overdosage: If you think you have taken too much of this medicine contact a poison control center or emergency room at once. NOTE: This medicine is only for you. Do not share this medicine with others. What if I miss a dose? If you miss a dose, take it as soon as you can. If it is almost time for your next dose, take only that dose. Do not take double or extra doses. What may interact with this medicine? Do not take this medicine with any of the following medications: -gatifloxacin This medicine may also interact with the following medications: -alcohol -digoxin -insulin -sulfonylureas like glimepiride, glipizide, glyburide This list may not describe all possible interactions. Give your health care provider a list of all the medicines, herbs, non-prescription drugs, or dietary supplements you use. Also tell them if you smoke, drink alcohol, or use illegal drugs. Some items may interact with your medicine. What should I watch for while using this medicine? Visit your doctor or health care professional for regular checks on your progress. A test called the HbA1C (A1C) will  be monitored. This is a simple blood test. It measures your blood sugar control over the last 2 to 3 months. You will receive this test every 3 to 6 months. Learn how to check your blood sugar. Learn the symptoms of low and high blood sugar and how to manage them. Always carry a quick-source of sugar with you in case you have symptoms of low blood sugar. Examples  include hard sugar candy or glucose tablets. Make sure others know that you can choke if you eat or drink when you develop serious symptoms of low blood sugar, such as seizures or unconsciousness. They must get medical help at once. Tell your doctor or health care professional if you have high blood sugar. You might need to change the dose of your medicine. If you are sick or exercising more than usual, you might need to change the dose of your medicine. Do not skip meals. Ask your doctor or health care professional if you should avoid alcohol. Many nonprescription cough and cold products contain sugar or alcohol. These can affect blood sugar. Wear a medical ID bracelet or chain, and carry a card that describes your disease and details of your medicine and dosage times. What side effects may I notice from receiving this medicine? Side effects that you should report to your doctor or health care professional as soon as possible: -allergic reactions like skin rash, itching or hives, swelling of the face, lips, or tongue -breathing problems -fever, chills -loss of appetite -signs and symptoms of low blood sugar such as feeling anxious, confusion, dizziness, increased hunger, unusually weak or tired, sweating, shakiness, cold, irritable, headache, blurred vision, fast heartbeat, loss of consciousness -unusual stomach pain or discomfort -vomiting Side effects that usually do not require medical attention (report to your doctor or health care professional if they continue or are bothersome): -diarrhea -headache -sore throat -stomach upset -stuffy or runny nose This list may not describe all possible side effects. Call your doctor for medical advice about side effects. You may report side effects to FDA at 1-800-FDA-1088. Where should I keep my medicine? Keep out of the reach of children. Store at room temperature between 15 and 30 degrees C (59 and 86 degrees F). Throw away any unused medicine after  the expiration date. NOTE: This sheet is a summary. It may not cover all possible information. If you have questions about this medicine, talk to your doctor, pharmacist, or health care provider.  2015, Elsevier/Gold Standard. (2012-09-12 12:04:01)    Practice Good Sleep Hygiene Here are some suggestions Avoid napping during the day. It can disturb the normal pattern of sleep and wakefulness.  Avoid stimulants such as caffeine, nicotine, and alcohol too close to bedtime. While alcohol is well known to speed the onset of sleep, it disrupts sleep in the second half as the body begins to metabolize the alcohol, causing arousal.  Exercise can promote good sleep. Vigorous exercise should be taken in the morning or late afternoon. A relaxing exercise, like yoga, can be done before bed to help initiate a restful night's sleep. Food can be disruptive right before sleep. Stay away from large meals close to bedtime. Also dietary changes can cause sleep problems, if someone is struggling with a sleep problem, it's not a good time to start experimenting with spicy dishes. And, remember, chocolate has caffeine.  Ensure adequate exposure to natural light. This is particularly important for older people who may not venture outside as frequently as children and adults. Light  exposure helps maintain a healthy sleep-wake cycle.  Establish a regular relaxing bedtime routine. Try to avoid emotionally upsetting conversations and activities before trying to go to sleep. Don't dwell on, or bring your problems to bed.  Associate your bed with sleep. It's not a good idea to use your bed to watch TV, listen to the radio, or read.  Make sure that the sleep environment is pleasant and relaxing. The bed should be comfortable, the room should not be too hot or cold, or too bright.

## 2014-11-13 NOTE — Progress Notes (Signed)
Medicine attending: Medical history, presenting problems, physical findings, and medications, reviewed with Dr Erik Hoffman and I concur with his evaluation and management plan. 

## 2014-11-13 NOTE — Progress Notes (Signed)
[facility] Subjective:   Patient ID: [name] female   DOB: Sep 09, 1950 64 y.o.   MRN: [id number]  HPI: [name] is a 64 y.o. female with a PMH detailed below who presents for follow up of left knee pain and diabeties. Please see A&P section below for further details about chronic issues.    Past Medical History  Diagnosis Date  . Hypertension   . Diabetes mellitus without complication     diagnosed around 2010, only ever on metformin  . Chronic pain     neck pain, headache, neuropathy  . Hypertriglyceridemia   . Neuromuscular disorder   . COPD (chronic obstructive pulmonary disease)   . CHF (congestive heart failure)   . Depression   . Puncture wound of foot, right 05/17/2012    Tetanus shot 3 yrs ago at [facility] in [place], per pt report   . Brachial plexus disorders    Current Outpatient Prescriptions  Medication Sig Dispense Refill  . acetaminophen (TYLENOL) 500 MG tablet Take 1 tablet (500 mg total) by mouth every 6 (six) hours as needed. Do not take more than 8 tablets in 24 hours (Patient not taking: Reported on 08/15/2014) 30 tablet 0  . albuterol (PROVENTIL HFA;VENTOLIN HFA) 108 (90 BASE) MCG/ACT inhaler Inhale 1-2 puffs into the lungs every 6 (six) hours as needed for wheezing or shortness of breath. 1 Inhaler 3  . amLODipine (NORVASC) 5 MG tablet Take 1 tablet (5 mg total) by mouth daily. 30 tablet 3  . aspirin 81 MG tablet Take 81 mg by mouth every morning.     . Blood Glucose Monitoring Suppl (ONE TOUCH ULTRA MINI) W/DEVICE KIT 1 Device by Does not apply route 2 (two) times daily. Sample provided to patient 1 each 0  . buPROPion (WELLBUTRIN XL) 150 MG 24 hr tablet Take 1 tablet (150 mg total) by mouth daily. (Patient not taking: Reported on 09/22/2014) 30 tablet 3  . butalbital-acetaminophen-caffeine (FIORICET, ESGIC) 50-325-40 MG per tablet Take 1 tablet by mouth at bedtime. 30 tablet 5  . DULoxetine (CYMBALTA) 60 MG capsule  Take 1 capsule (60 mg total) by mouth every morning. 90 capsule 3  . gabapentin (NEURONTIN) 600 MG tablet take 1 and 1/2 tablet by mouth three times a day 405 tablet 1  . glipiZIDE (GLUCOTROL XL) 10 MG 24 hr tablet Take 1 tablet (10 mg total) by mouth daily with supper. 90 tablet 3  . glucose blood (ONE TOUCH ULTRA TEST) test strip Check blood sugar two times a day as instructed 50 each 5  . lisinopril (PRINIVIL,ZESTRIL) 40 MG tablet Take 1 tablet (40 mg total) by mouth daily. 90 tablet 3  . metformin (GLUCOPHAGE-XR) 750 MG 24 hr tablet Take 2 tablets (1,500 mg total) by mouth daily with breakfast. 60 tablet 6  . ondansetron (ZOFRAN) 4 MG tablet Take 1 tablet (4 mg total) by mouth every 6 (six) hours. 30 tablet 0  . ONETOUCH DELICA LANCETS FINE MISC Check blood sugar up to 2 times a  Day as instructed 50 each 5  . pantoprazole (PROTONIX) 20 MG tablet Take 1 tablet (20 mg total) by mouth daily. 90 tablet 3  . rosuvastatin (CRESTOR) 40 MG tablet Take 1 tablet (40 mg total) by mouth daily. 30 tablet 11  . sitaGLIPtin (JANUVIA) 100 MG tablet Take 1 tablet (100 mg total) by mouth daily. 90 tablet 3   No current facility-administered medications for this visit.   Family History  Problem  Relation Age of Onset  . Hyperlipidemia Mother   . Heart attack Father 108  . Hypertension Father   . Cancer Paternal Grandfather     Lung cancer  . Cancer Maternal Grandmother   . Heart attack Maternal Grandmother    History   Social History  . Marital Status: Widowed    Spouse Name: N/A  . Number of Children: N/A  . Years of Education: 14   Occupational History  . unemployed    Social History Main Topics  . Smoking status: Current Every Day Smoker -- 0.50 packs/day for 41 years    Types: Cigarettes  . Smokeless tobacco: Never Used     Comment: Trying to cut back.  . Alcohol Use: No  . Drug Use: No  . Sexual Activity: Not on file   Other Topics Concern  . None   Social History Narrative    Moved to [facility] from [place] Jun 03, 2012, shortly after the death of her husband, to live with her daughter.  [name] notes significant life stress related to her 60 year-old grand-daughter with bipolar disorder.  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   Review of Systems: Review of Systems  Constitutional: Negative for fever, chills, weight loss and malaise/fatigue.  Eyes: Negative for blurred vision.  Respiratory: Negative for cough and shortness of breath.   Cardiovascular: Negative for chest pain and leg swelling.  Gastrointestinal: Negative for heartburn and abdominal pain.  Genitourinary: Negative for dysuria.  Musculoskeletal: Positive for joint pain. Negative for myalgias.  Neurological: Negative for dizziness and headaches.  Endo/Heme/Allergies: Negative for polydipsia.  Psychiatric/Behavioral: Negative for substance abuse.     Objective:  Physical Exam: Filed Vitals:   11/13/14 1003  BP: 142/68  Pulse: 87  Temp: 98.3 F (36.8 C)  TempSrc: Oral  Height: 5' 6"  (1.676 m)  Weight: 196 lb (88.905 kg)  SpO2: 98%   Physical Exam  Constitutional: [name] is well-developed, well-nourished, and in no distress. No distress.  HENT:  Head: Normocephalic and atraumatic.  Eyes: Conjunctivae are normal.  Cardiovascular: Normal rate, regular rhythm, normal heart sounds and intact distal pulses.   No murmur heard. Pulmonary/Chest: Effort normal and breath sounds normal. No respiratory distress. [name] has no wheezes. [name] has no rales.  Abdominal: Soft. Bowel sounds are normal. [name] exhibits no distension. There is no tenderness.  Musculoskeletal: [name] exhibits no edema.       Left knee: [name] exhibits no swelling, normal alignment, no LCL laxity, normal meniscus and no MCL laxity. Tenderness found. Medial joint line and lateral joint line tenderness noted.  Skin: Skin is warm and dry. [name] is not diaphoretic.  Psychiatric: Affect and judgment normal.  Nursing note and vitals reviewed.     Assessment & Plan:  Case discussed with Dr. Beryle Beams  Hypertension BP Readings from Last 3 Encounters:  11/13/14 142/68  09/22/14 124/74  08/15/14 131/71    Lab Results  Component Value Date   NA 139 09/22/2014   K 4.7 09/22/2014   CREATININE 0.76 09/22/2014    Assessment: Blood pressure control: mildly elevated Progress toward BP goal:  deteriorated Comments: just slightly above goal  Plan: Medications:  continue current medications Educational resources provided:   Self management tools provided:   Other plans:     Type 2 diabetes, uncontrolled, with neuropathy Lab Results  Component Value Date   HGBA1C 7.6 11/13/2014   HGBA1C 8.7 07/18/2014   HGBA1C 8.0 04/03/2014     Assessment: Diabetes control: fair  control Progress toward A1C goal:  improved Comments:   Plan: Medications: Continue Metformin ER 732m BID, Glipizide XL 179mdaily, add Januvia 10057maily Home glucose monitoring: Frequency: 2 times a day Timing: before breakfast, before dinner Instruction/counseling given: reminded to get eye exam, reminded to bring blood glucose meter & log to each visit and reminded to bring medications to each visit Educational resources provided: handout Self management tools provided: copy of home glucose meter download Other plans: Adding Januvia to help get to goal.      Primary osteoarthritis of left knee -Patient seen 2 months ago for left knee pain, Xray noted tricompartmental arthrititis and possible loose bodies.  [name] notes no improvement with full dose Ibuprofen over last 2 months and returns today as instructed for possible referral to Orthopaedic surgery. - Place referral to Ortho for eval.     Medications Ordered Meds ordered this encounter  Medications  . sitaGLIPtin (JANUVIA) 100 MG tablet    Sig: Take 1 tablet (100 mg total) by mouth daily.    Dispense:  90 tablet    Refill:  3   Other Orders Orders Placed This Encounter  Procedures   . Glucose, capillary  . Ambulatory referral to Orthopedic Surgery    Referral Priority:  Routine    Referral Type:  Surgical    Referral Reason:  Specialty Services Required    Requested Specialty:  Orthopedic Surgery    Number of Visits Requested:  1  . POCT glycosylated hemoglobin (Hb A1C)

## 2014-11-14 NOTE — Assessment & Plan Note (Signed)
Lab Results  Component Value Date   HGBA1C 7.6 11/13/2014   HGBA1C 8.7 07/18/2014   HGBA1C 8.0 04/03/2014     Assessment: Diabetes control: fair control Progress toward A1C goal:  improved Comments:   Plan: Medications: Continue Metformin ER 750mg  BID, Glipizide XL 10mg  daily, add Januvia 100mg  daily Home glucose monitoring: Frequency: 2 times a day Timing: before breakfast, before dinner Instruction/counseling given: reminded to get eye exam, reminded to bring blood glucose meter & log to each visit and reminded to bring medications to each visit Educational resources provided: handout Self management tools provided: copy of home glucose meter download Other plans: Adding Januvia to help get to goal.

## 2014-11-14 NOTE — Assessment & Plan Note (Signed)
-  Patient seen 2 months ago for left knee pain, Xray noted tricompartmental arthrititis and possible loose bodies.  She notes no improvement with full dose Ibuprofen over last 2 months and returns today as instructed for possible referral to Orthopaedic surgery. - Place referral to Ortho for eval.

## 2014-11-14 NOTE — Assessment & Plan Note (Signed)
BP Readings from Last 3 Encounters:  11/13/14 142/68  09/22/14 124/74  08/15/14 131/71    Lab Results  Component Value Date   NA 139 09/22/2014   K 4.7 09/22/2014   CREATININE 0.76 09/22/2014    Assessment: Blood pressure control: mildly elevated Progress toward BP goal:  deteriorated Comments: just slightly above goal  Plan: Medications:  continue current medications Educational resources provided:   Self management tools provided:   Other plans:

## 2014-11-21 ENCOUNTER — Ambulatory Visit: Payer: Medicare Other | Admitting: Pulmonary Disease

## 2014-11-26 DIAGNOSIS — M25562 Pain in left knee: Secondary | ICD-10-CM | POA: Diagnosis not present

## 2014-12-02 ENCOUNTER — Other Ambulatory Visit: Payer: Self-pay | Admitting: Internal Medicine

## 2014-12-05 DIAGNOSIS — M25562 Pain in left knee: Secondary | ICD-10-CM | POA: Diagnosis not present

## 2014-12-05 NOTE — Telephone Encounter (Signed)
As this patient was seen by Dr. Heber Wickliffe on 11/13/14 and deemed necessary for their treatment as documented in the EHR, I will refill this prescription for lisinopril

## 2014-12-10 DIAGNOSIS — M25562 Pain in left knee: Secondary | ICD-10-CM | POA: Diagnosis not present

## 2015-01-05 ENCOUNTER — Other Ambulatory Visit: Payer: Self-pay | Admitting: Internal Medicine

## 2015-01-05 NOTE — Telephone Encounter (Signed)
As this patient was seen by Dr. Heber Weldon on 11/13/14 and deemed necessary for their treatment as documented in the EHR, I will refill this prescription for amlodipine

## 2015-01-14 DIAGNOSIS — M25562 Pain in left knee: Secondary | ICD-10-CM | POA: Diagnosis not present

## 2015-01-22 ENCOUNTER — Telehealth: Payer: Self-pay | Admitting: Internal Medicine

## 2015-01-22 NOTE — Telephone Encounter (Signed)
Call to patient to confirm appointment for 01/23/15 at 2:15 phone is disconnected

## 2015-01-23 ENCOUNTER — Ambulatory Visit (INDEPENDENT_AMBULATORY_CARE_PROVIDER_SITE_OTHER): Payer: Medicare Other | Admitting: Internal Medicine

## 2015-01-23 ENCOUNTER — Encounter: Payer: Self-pay | Admitting: Internal Medicine

## 2015-01-23 VITALS — BP 118/70 | HR 88 | Temp 97.6°F | Ht 66.0 in | Wt 186.6 lb

## 2015-01-23 DIAGNOSIS — F330 Major depressive disorder, recurrent, mild: Secondary | ICD-10-CM

## 2015-01-23 DIAGNOSIS — F339 Major depressive disorder, recurrent, unspecified: Secondary | ICD-10-CM | POA: Diagnosis not present

## 2015-01-23 DIAGNOSIS — F17200 Nicotine dependence, unspecified, uncomplicated: Secondary | ICD-10-CM

## 2015-01-23 DIAGNOSIS — M1712 Unilateral primary osteoarthritis, left knee: Secondary | ICD-10-CM | POA: Diagnosis not present

## 2015-01-23 DIAGNOSIS — F17210 Nicotine dependence, cigarettes, uncomplicated: Secondary | ICD-10-CM

## 2015-01-23 DIAGNOSIS — Z79899 Other long term (current) drug therapy: Secondary | ICD-10-CM | POA: Diagnosis not present

## 2015-01-23 DIAGNOSIS — Z0000 Encounter for general adult medical examination without abnormal findings: Secondary | ICD-10-CM

## 2015-01-23 DIAGNOSIS — E1165 Type 2 diabetes mellitus with hyperglycemia: Secondary | ICD-10-CM

## 2015-01-23 DIAGNOSIS — E1142 Type 2 diabetes mellitus with diabetic polyneuropathy: Secondary | ICD-10-CM | POA: Diagnosis present

## 2015-01-23 DIAGNOSIS — E1140 Type 2 diabetes mellitus with diabetic neuropathy, unspecified: Secondary | ICD-10-CM

## 2015-01-23 DIAGNOSIS — IMO0002 Reserved for concepts with insufficient information to code with codable children: Secondary | ICD-10-CM

## 2015-01-23 LAB — GLUCOSE, CAPILLARY: GLUCOSE-CAPILLARY: 120 mg/dL — AB (ref 65–99)

## 2015-01-23 MED ORDER — GABAPENTIN 600 MG PO TABS: 1200.0000 mg | ORAL_TABLET | Freq: Three times a day (TID) | ORAL | Status: DC

## 2015-01-23 MED ORDER — DULOXETINE HCL 60 MG PO CPEP: 60.0000 mg | ORAL_CAPSULE | ORAL | Status: DC

## 2015-01-23 NOTE — Patient Instructions (Addendum)
We refilled your Cymbalta today and will work on getting records from your orthopedist.   Keep up the good work!  Please follow-up in 6 months or as needed.

## 2015-01-23 NOTE — Progress Notes (Signed)
   Subjective:    Patient ID: [name], female    DOB: 1951/06/13, 64 y.o.   MRN: [id number]  HPI [name] is a 64 year old female with well-controlled type 2 diabetes complicated by neuropathy, depression, osteoarthritis of the left knee who presents today for follow-up visit.   Please see assessment & plan for documentation of each problem.  Review of Systems  Respiratory: Negative for shortness of breath.   Cardiovascular: Negative for chest pain and leg swelling (Knee).  Gastrointestinal: Negative for nausea, vomiting, abdominal pain, diarrhea and blood in stool.  Neurological: Negative for dizziness.       Objective:   Physical Exam  Constitutional: Overweight, elderly appearing Caucasian female. No distress.  HENT:  Head: Normocephalic and atraumatic.  Eyes: Conjunctivae are normal. Pupils are 3 mm, direct, consensual, near.  Cardiovascular: Normal rate, regular rhythm and normal heart sounds.  Exam reveals no gallop and no friction rub. No murmur heard. Pulmonary/Chest: Effort normal. No respiratory distress. No wheezes, no rales.  Abdominal: Soft. Bowel sounds are normal.  nondistended, nontender.   Neurological: Alert and oriented to person, place, and time. Coordination normal.  Skin: Warm and dry. Not diaphoretic.  Psychiatric: Appropriate affect.      Assessment & Plan:

## 2015-01-23 NOTE — Assessment & Plan Note (Addendum)
She reports that her symptoms are intolerable, and her neuropathic pain is now bothering her in the afternoon versus the night. As such, I increased her gabapentin to 1200 mg 3 times daily, up from 900 mg 3 times daily. She is also taking Cymbalta which is an adjunctive therapy.

## 2015-01-23 NOTE — Assessment & Plan Note (Signed)
Since her last visit, she followed up with [facility] orthopedics of cannot recall the name of the physician is last seen sometime last week. She reports that she received a steroid injection in her knee and was advised that she could continue receiving injections she could tolerate symptoms at 3 month intervals. She however feels that she will ultimately need a left knee replacement. I will ask our staff members to retrieve her office notes.

## 2015-01-23 NOTE — Assessment & Plan Note (Signed)
  Assessment: Progress toward smoking cessation:  smoking the same amount (Half pack per day) Barriers to progress toward smoking cessation:  stress, lack of motivation to quit Comments: She continues to smoke the same amount and has no interest in quitting.   Plan: Instruction/counseling given:  I counseled patient on the dangers of tobacco use, advised patient to stop smoking, and reviewed strategies to maximize success. Educational resources provided:    Self management tools provided:    Medications to assist with smoking cessation:  None Patient agreed to the following self-care plans for smoking cessation: cut down the number of cigarettes smoked  Other plans: Continue reassessing at each visit.

## 2015-01-23 NOTE — Assessment & Plan Note (Signed)
Lab Results  Component Value Date   HGBA1C 7.6 11/13/2014   HGBA1C 8.7 07/18/2014   HGBA1C 8.0 04/03/2014     Assessment: Diabetes control: good control (HgbA1C at goal) Progress toward A1C goal:  at goal Comments: She does not have her glucometer with her today for [place] to review her readings though reports to me that they have mainly been in the low 100s. Blood glucose in clinic today is consistent with this. She reports increasing her water intake during the last few months in an attempt to cut down on her soda intake. She is also been out of test strips for the past month since she is waiting for her Medicaid paperwork to be processed. Otherwise, she denies symptoms of hypoglycemia, like diaphoresis, nausea, vomiting. She did follow-up to have an eye exam in the last 1-2 months at which time she was told by the doctor that she has cataracts though inappropriate to be excised.  Plan: Medications:  Metformin XR 750 mg twice daily, glipizide 10 mg, Januvia 100 mg Home glucose monitoring: Frequency:  (Not checking due to not having test strips) Timing:   Instruction/counseling given: reminded to bring blood glucose meter & log to each visit and reminded to bring medications to each visit Educational resources provided:   Self management tools provided:   Other plans:  -Request the assistance of clinic diabetic educator to assist her with procuring her test strips -Request the assistance the front desk to retrieve records

## 2015-01-23 NOTE — Assessment & Plan Note (Signed)
She reports being more tearful than normal though has been out of Cymbalta for months now. She never picked up Wellbutrin as noted in the last visit due to paperwork that was never completed in our office. She denies any suicidal ideation and feels energy, appetite, sleep, and concentration are all variable. I refilled her medication and advised her to call should she be out of medication to avoid letting her symptoms go uncontrolled.

## 2015-01-23 NOTE — Assessment & Plan Note (Signed)
She declined receiving Prevnar today. She reports that she was established with Dr.Qui in [place], and I will request the assistance of our staff to retrieve records for her.

## 2015-01-26 NOTE — Progress Notes (Signed)
Internal Medicine Clinic Attending  Case discussed with Dr. Patel,Rushil soon after the resident saw the patient.  We reviewed the resident's history and exam and pertinent patient test results.  I agree with the assessment, diagnosis, and plan of care documented in the resident's note. 

## 2015-01-28 ENCOUNTER — Encounter: Payer: Self-pay | Admitting: Internal Medicine

## 2015-01-28 NOTE — Progress Notes (Signed)
She would love it !!

## 2015-01-28 NOTE — Progress Notes (Signed)
Patient ID: [name], female   DOB: May 29, 1951, 64 y.o.   MRN: [id number]  I am reviewing paperwork from [place]:  Received 01/26/15: Consideration for increasing patient's prescription of glipizide 10 mg and metformin extended release 750 mg to 90 day supplies  I recently saw this patient last week and think that she could benefit from minimizing the hassle associated with refilling all the medication she is currently on.

## 2015-01-29 ENCOUNTER — Other Ambulatory Visit: Payer: Self-pay | Admitting: Internal Medicine

## 2015-01-29 DIAGNOSIS — E1165 Type 2 diabetes mellitus with hyperglycemia: Principal | ICD-10-CM

## 2015-01-29 DIAGNOSIS — IMO0002 Reserved for concepts with insufficient information to code with codable children: Secondary | ICD-10-CM

## 2015-01-29 DIAGNOSIS — E1140 Type 2 diabetes mellitus with diabetic neuropathy, unspecified: Secondary | ICD-10-CM

## 2015-01-29 MED ORDER — METFORMIN HCL ER 750 MG PO TB24: 1500.0000 mg | ORAL_TABLET | Freq: Every day | ORAL | Status: DC

## 2015-02-05 ENCOUNTER — Telehealth: Payer: Self-pay

## 2015-02-05 NOTE — Telephone Encounter (Signed)
I am uncomfortable prescribing a muscle relaxant over the phone.  She needs an appointment and examination to assess for neurological abnormalities as treatment would be very different.  Thanks.

## 2015-02-05 NOTE — Telephone Encounter (Signed)
Patient is calling for a muscle relaxer for a couple of days, patient moving furniture at home in a lot of pain

## 2015-02-05 NOTE — Telephone Encounter (Signed)
Talked with pt and she will come in tomorrow and see her PCP.  2:15.

## 2015-02-05 NOTE — Telephone Encounter (Signed)
I called pt,and she states she lifted up a hope chest and a electric recliner.  She had help from another person but lifted into a [employer]-haul trailer. Onset yesterday.  She is now having low back pain without radiation.   She is taking tylenol 500 mg 2 and also IBU 600 mg in between. She has used heat and is not getting any relief.  She rates pain 7 on scale of 0 - 10.  She does have increased pain with ambulation.  Position change does not help. She is asking for a muscle relaxant.  No hx of back problems.  Please advise.  # [id number]

## 2015-02-06 ENCOUNTER — Ambulatory Visit (INDEPENDENT_AMBULATORY_CARE_PROVIDER_SITE_OTHER): Payer: Medicare Other | Admitting: Internal Medicine

## 2015-02-06 ENCOUNTER — Encounter: Payer: Self-pay | Admitting: Internal Medicine

## 2015-02-06 VITALS — BP 120/65 | HR 72 | Temp 98.3°F | Ht 66.0 in | Wt 187.7 lb

## 2015-02-06 DIAGNOSIS — Z2821 Immunization not carried out because of patient refusal: Secondary | ICD-10-CM | POA: Diagnosis not present

## 2015-02-06 DIAGNOSIS — M545 Low back pain, unspecified: Secondary | ICD-10-CM

## 2015-02-06 MED ORDER — ACETAMINOPHEN-CODEINE #3 300-30 MG PO TABS: 1.0000 | ORAL_TABLET | Freq: Four times a day (QID) | ORAL | Status: DC | PRN

## 2015-02-06 NOTE — Progress Notes (Signed)
   Subjective:    Patient ID: [name], female    DOB: 27-Dec-1950, 64 y.o.   MRN: [id number]  HPI [name] is a 64 year old female with well-controlled type 2 diabetes complicated by neuropathy, depression, osteoarthritis of the left knee who presents today for evaluation of back pain. Please see assessment & plan for documentation of each problem.   Review of Systems  Gastrointestinal: Negative for rectal pain.  Musculoskeletal: Positive for back pain.  Neurological:       No numbness or tingling, bladder/bowel dysfunction       Objective:   Physical Exam Constitutional: Middle-aged Caucasian female. No distress.  Head: Normocephalic and atraumatic.  Cardiovascular: Normal rate, regular rhythm and normal heart sounds.  No gallop, friction rub, murmur heard. Pulmonary/Chest: Effort normal. No respiratory distress. No wheezes, rales.  Back: Diffuse tenderness of the lower back, right greater than left. Unable to tolerate lying down to assess lower extremity strength.  Neurological: Alert and oriented to person, place, and time.  Coordination normal.  Skin: Warm and dry. Not diaphoretic.  Psychiatric: Affect appropriate.         Assessment & Plan:

## 2015-02-06 NOTE — Assessment & Plan Note (Signed)
She declines the flu vaccine when offered today.

## 2015-02-06 NOTE — Assessment & Plan Note (Signed)
Overview 2 days ago, she attempted lifting a heavy piece of furniture and sprained her back. Since then, she has been taking Tylenol 1000 mg and ibuprofen 800 mg alternating each every 4-6 hours for a total of 8 doses yesterday. She has taken 4 doses [2 of each] today. She denies any numbness/tingling, pain radiating to either leg, bowel/bladder dysfunction. MRI April 2015 notable for thoracolumbar spondylosis and degenerative disc disease [moderate to prominent impingement at L5-S1; moderate impingement at L2-3, L3-4, and L4-5; and mild impingement at the T7-8 level]. She reports having a reaction to Flexeril when taken in the past as a resulted in heart issues [prolonged QTC per chart].  Assessment Suspect this is muscular strain in the absence of red flag signs as noted above.  Plan -Prescribed Tylenol 3 30 tablets and advised the patient to take this medication only when needed and to alternate with ibuprofen she is currently taking -Reminded her the maximum dose for ibuprofen and Tylenol as 3200 mg and 3000 mg respectively -Advised her that pain would be the worst one to 2 weeks immediately after the injury that would take a total of up to 6 weeks for it to resolve completely -Advised her to go to emergency department immediately should she have any worsening numbness/tingling, bowel/bladder dysfunction, pain to which she acknowledged understanding

## 2015-02-06 NOTE — Patient Instructions (Signed)
Please continue alternating ibuprofen with Tylenol #3. Do NOT take more than 3,000mg  of Tylenol or 3,200mg  of ibuprofen in any given day.  Pain may take up to six weeks to fully go away and is the worst in the first 1-2 weeks.

## 2015-02-10 NOTE — Progress Notes (Signed)
Internal Medicine Clinic Attending  Case discussed with Dr. Patel,Rushil at the time of the visit.  We reviewed the resident's history and exam and pertinent patient test results.  I agree with the assessment, diagnosis, and plan of care documented in the resident's note.  

## 2015-02-12 ENCOUNTER — Other Ambulatory Visit: Payer: Self-pay | Admitting: Internal Medicine

## 2015-02-12 NOTE — Telephone Encounter (Signed)
Talked with pt this AM - still having pain right hip area and no relief  with Tylenol #3 or ibuprofen. Unable to come today to clinic.  Appt made Dr Denton Brick 02/13/15 9:45AMAM. [name] Echo [place]sbrook RN 02/12/15 10:38AM

## 2015-02-12 NOTE — Telephone Encounter (Signed)
Pt stated tylenol 3 does not work. Please call pt back.

## 2015-02-13 ENCOUNTER — Ambulatory Visit (INDEPENDENT_AMBULATORY_CARE_PROVIDER_SITE_OTHER): Payer: Medicare Other | Admitting: Internal Medicine

## 2015-02-13 ENCOUNTER — Encounter: Payer: Self-pay | Admitting: Internal Medicine

## 2015-02-13 VITALS — BP 144/71 | HR 64 | Temp 98.4°F | Ht 66.0 in | Wt 190.4 lb

## 2015-02-13 DIAGNOSIS — F17210 Nicotine dependence, cigarettes, uncomplicated: Secondary | ICD-10-CM

## 2015-02-13 DIAGNOSIS — E1140 Type 2 diabetes mellitus with diabetic neuropathy, unspecified: Secondary | ICD-10-CM

## 2015-02-13 DIAGNOSIS — IMO0002 Reserved for concepts with insufficient information to code with codable children: Secondary | ICD-10-CM

## 2015-02-13 DIAGNOSIS — E1165 Type 2 diabetes mellitus with hyperglycemia: Principal | ICD-10-CM

## 2015-02-13 DIAGNOSIS — M5441 Lumbago with sciatica, right side: Secondary | ICD-10-CM

## 2015-02-13 MED ORDER — HYDROCODONE-ACETAMINOPHEN 5-325 MG PO TABS: 1.0000 | ORAL_TABLET | Freq: Four times a day (QID) | ORAL | Status: DC | PRN

## 2015-02-13 NOTE — Assessment & Plan Note (Signed)
Pain likely due to recent heavy lifting. Considering age and tobacco abuse, she is at risk for fractures. But favour Muscle and ligament strain/sprain, which would take a few weeks to heal. Tylenol 3 and ibuprofen are no longer helping.   Plan- Will give prescription for Hydrocodone-Acetaminophen- 5-325 1-2 tabs Q6H, #30 - Pt had to hurry for an appointment, aggred that is pain gets worse or does not get better with pain meds in the next 2 weeks- she will come in for xrays. So far no alarm features. - Hot or cold compress

## 2015-02-13 NOTE — Addendum Note (Signed)
Addended by: Bethena Roys on: 02/13/2015 01:02 PM   Modules accepted: Orders, Medications

## 2015-02-13 NOTE — Patient Instructions (Signed)
If the pain medication does not work, come back for xrays of your hip and back.  Also you can apply heat or cold compresses to the area, which ever works for you.  Back Pain, Adult Low back pain is very common. About 1 in 5 people have back pain.The cause of low back pain is rarely dangerous. The pain often gets better over time.About half of people with a sudden onset of back pain feel better in just 2 weeks. About 8 in 10 people feel better by 6 weeks.  CAUSES Some common causes of back pain include:  Strain of the muscles or ligaments supporting the spine.  Wear and tear (degeneration) of the spinal discs.  Arthritis.  Direct injury to the back. DIAGNOSIS Most of the time, the direct cause of low back pain is not known.However, back pain can be treated effectively even when the exact cause of the pain is unknown.Answering your caregiver's questions about your overall health and symptoms is one of the most accurate ways to make sure the cause of your pain is not dangerous. If your caregiver needs more information, he or she may order lab work or imaging tests (X-rays or MRIs).However, even if imaging tests show changes in your back, this usually does not require surgery. HOME CARE INSTRUCTIONS For many people, back pain returns.Since low back pain is rarely dangerous, it is often a condition that people can learn to [facility] their own.   Remain active. It is stressful on the back to sit or stand in one place. Do not sit, drive, or stand in one place for more than 30 minutes at a time. Take short walks on level surfaces as soon as pain allows.Try to increase the length of time you walk each day.  Do not stay in bed.Resting more than 1 or 2 days can delay your recovery.  Do not avoid exercise or work.Your body is made to move.It is not dangerous to be active, even though your back may hurt.Your back will likely heal faster if you return to being active before your pain is  gone.  Pay attention to your body when you bend and lift. Many people have less discomfortwhen lifting if they bend their knees, keep the load close to their bodies,and avoid twisting. Often, the most comfortable positions are those that put less stress on your recovering back.  Find a comfortable position to sleep. Use a firm mattress and lie on your side with your knees slightly bent. If you lie on your back, put a pillow under your knees.  Only take over-the-counter or prescription medicines as directed by your caregiver. Over-the-counter medicines to reduce pain and inflammation are often the most helpful.Your caregiver may prescribe muscle relaxant drugs.These medicines help dull your pain so you can more quickly return to your normal activities and healthy exercise.  Put ice on the injured area.  Put ice in a plastic bag.  Place a towel between your skin and the bag.  Leave the ice on for 15-20 minutes, 03-04 times a day for the first 2 to 3 days. After that, ice and heat may be alternated to reduce pain and spasms.  Ask your caregiver about trying back exercises and gentle massage. This may be of some benefit.  Avoid feeling anxious or stressed.Stress increases muscle tension and can worsen back pain.It is important to recognize when you are anxious or stressed and learn ways to manage it.Exercise is a great option. SEEK MEDICAL CARE IF:  You  have pain that is not relieved with rest or medicine.  You have pain that does not improve in 1 week.  You have new symptoms.  You are generally not feeling well. SEEK IMMEDIATE MEDICAL CARE IF:   You have pain that radiates from your back into your legs.  You develop new bowel or bladder control problems.  You have unusual weakness or numbness in your arms or legs.  You develop nausea or vomiting.  You develop abdominal pain.  You feel faint.

## 2015-02-13 NOTE — Progress Notes (Signed)
Patient ID: [name], female   DOB: 1950-06-18, 64 y.o.   MRN: [id number]   Subjective:   Patient ID: [name] female   DOB: 10-Jan-1951 64 y.o.   MRN: [id number]  HPI: [name] is a 64 y.o. PMH lisrted below, presented with complaints of back pain that has moved to her hip area. Was seen in clinic 8/26 for back pain that started about a week ago now, since she lifted 2 heavy objects, one was a chest weighing 600lbs with her son-in-law. Back pain has improved, but now she is having some pain involving the right side of her pelvis posteriorly- sacro-iliac area. She has been taking tylenol 3 and ibuprofen , this helped initially but no longer helps.  She denies weakness in her legs, has some shooting pain form her back to her leg, but not present bilaterally, she denies abnormal groin sensation, urinary or fecal problems. Walking aggravates pain.  Past Medical History  Diagnosis Date  . Hypertension   . Diabetes mellitus without complication     diagnosed around 2010, only ever on metformin  . Chronic pain     neck pain, headache, neuropathy  . Hypertriglyceridemia   . Neuromuscular disorder   . COPD (chronic obstructive pulmonary disease)   . CHF (congestive heart failure)   . Depression   . Puncture wound of foot, right 05/17/2012    Tetanus shot 3 yrs ago at [facility] in [place], per pt report   . Brachial plexus disorders    Current Outpatient Prescriptions  Medication Sig Dispense Refill  . acetaminophen (TYLENOL) 500 MG tablet Take 1 tablet (500 mg total) by mouth every 6 (six) hours as needed. Do not take more than 8 tablets in 24 hours 30 tablet 0  . acetaminophen-codeine (TYLENOL #3) 300-30 MG per tablet Take 1 tablet by mouth every 6 (six) hours as needed for moderate pain. 30 tablet 0  . albuterol (PROVENTIL HFA;VENTOLIN HFA) 108 (90 BASE) MCG/ACT inhaler Inhale 1-2 puffs into the lungs every 6 (six) hours as needed for wheezing or shortness of breath. 1  Inhaler 3  . amLODipine (NORVASC) 5 MG tablet take 1 tablet by mouth once daily 30 tablet 6  . aspirin 81 MG tablet Take 81 mg by mouth every morning.     . Blood Glucose Monitoring Suppl (ONE TOUCH ULTRA MINI) W/DEVICE KIT 1 Device by Does not apply route 2 (two) times daily. Sample provided to patient 1 each 0  . butalbital-acetaminophen-caffeine (FIORICET, ESGIC) 50-325-40 MG per tablet Take 1 tablet by mouth at bedtime. 30 tablet 5  . DULoxetine (CYMBALTA) 60 MG capsule Take 1 capsule (60 mg total) by mouth every morning. 90 capsule 3  . gabapentin (NEURONTIN) 600 MG tablet Take 2 tablets (1,200 mg total) by mouth 3 (three) times daily. 180 tablet 6  . glipiZIDE (GLUCOTROL XL) 10 MG 24 hr tablet Take 1 tablet (10 mg total) by mouth daily with supper. 90 tablet 3  . glucose blood (ONE TOUCH ULTRA TEST) test strip Check blood sugar two times a day as instructed 50 each 5  . lisinopril (PRINIVIL,ZESTRIL) 40 MG tablet take 1 tablet by mouth once daily 90 tablet 3  . metFORMIN (GLUCOPHAGE-XR) 750 MG 24 hr tablet Take 2 tablets (1,500 mg total) by mouth daily with breakfast. 180 tablet 3  . ONETOUCH DELICA LANCETS FINE MISC Check blood sugar up to 2 times a  Day as instructed 50 each 5  . pantoprazole (PROTONIX) 20 MG tablet  Take 1 tablet (20 mg total) by mouth daily. 90 tablet 3  . rosuvastatin (CRESTOR) 40 MG tablet Take 1 tablet (40 mg total) by mouth daily. 30 tablet 11  . sitaGLIPtin (JANUVIA) 100 MG tablet Take 1 tablet (100 mg total) by mouth daily. 90 tablet 3   No current facility-administered medications for this visit.   Family History  Problem Relation Age of Onset  . Hyperlipidemia Mother   . Heart attack Father 12  . Hypertension Father   . Cancer Paternal Grandfather     Lung cancer  . Cancer Maternal Grandmother   . Heart attack Maternal Grandmother    Social History   Social History  . Marital Status: Widowed    Spouse Name: N/A  . Number of Children: N/A  . Years of  Education: 14   Occupational History  . unemployed    Social History Main Topics  . Smoking status: Current Every Day Smoker -- 0.50 packs/day for 41 years    Types: Cigarettes  . Smokeless tobacco: Never Used     Comment: Trying to cut back. 1/2PPD OR LESS  . Alcohol Use: No  . Drug Use: No  . Sexual Activity: Not Asked   Other Topics Concern  . None   Social History Narrative   Moved to [facility] from [place] 05-09-2012, shortly after the death of her husband, to live with her daughter.  She notes significant life stress related to her 22 year-old grand-daughter with bipolar disorder.  She has previously worked as a Occupational psychologist.   Review of Systems: CONSTITUTIONAL- No Fever,  change in appetite. SKIN- No Rash, colour changes or itching. HEAD- No Headache or dizziness. RESPIRATORY- No Cough or SOB. CARDIAC- No Palpitations, chest pain. GI- No  vomiting, diarrhoea, abd pain. URINARY- No Frequency, urgency, straining or dysuria.  Objective:  Physical Exam: Filed Vitals:   02/13/15 0948  BP: 144/71  Pulse: 64  Temp: 98.4 F (36.9 C)  TempSrc: Oral  Height: _0  (1.676 m)  Weight: 190 lb 6.4 oz (86.365 kg)  SpO2: 98%   GENERAL- alert, co-operative, appears as stated age, not in any distress. HEENT- Atraumatic, normocephalic, PERRL, EOMI, oral mucosa appears moist, neck supple. CARDIAC- RRR, no murmurs, rubs or gallops. RESP- Moving equal volumes of air, and clear to auscultation bilaterally, no wheezes or crackles. ABDOMEN- Soft, nontender, no guarding or rebound, no palpable masses or organomegaly, bowel sounds present. BACK- Normal curvature of the spine, No tenderness along the vertebrae,  tenderness on palpation of sacroiliac area NEURO- No obvious Cr N abnormality, strenght upper and lower extremities- 5/5, but slightly limited by pain right LE, Sensation intact- globally, DTRs- Patella tested- Normal,  Gait- slight limping favouring right due to pain, increased  pain in Sacroiliac area on externally rotating thing. EXTREMITIES- pulse 2+, symmetric, no pedal edema. SKIN- Warm, dry, No rash or lesion. PSYCH- Normal mood and affect, appropriate thought content and speech.  Assessment & Plan:   The patient's case and plan of care was discussed with attending physician, Dr. Daryll Drown.  Please see problem based charting for assessment and plan.

## 2015-02-14 ENCOUNTER — Emergency Department (HOSPITAL_COMMUNITY)
Admission: EM | Admit: 2015-02-14 | Discharge: 2015-02-15 | Disposition: A | Discharge status: Home / Self Care | Payer: Medicare Other | Attending: Emergency Medicine | Admitting: Emergency Medicine

## 2015-02-14 ENCOUNTER — Encounter (HOSPITAL_COMMUNITY): Payer: Self-pay | Admitting: *Deleted

## 2015-02-14 DIAGNOSIS — I509 Heart failure, unspecified: Secondary | ICD-10-CM | POA: Insufficient documentation

## 2015-02-14 DIAGNOSIS — J449 Chronic obstructive pulmonary disease, unspecified: Secondary | ICD-10-CM | POA: Insufficient documentation

## 2015-02-14 DIAGNOSIS — M5431 Sciatica, right side: Secondary | ICD-10-CM

## 2015-02-14 DIAGNOSIS — F329 Major depressive disorder, single episode, unspecified: Secondary | ICD-10-CM | POA: Diagnosis not present

## 2015-02-14 DIAGNOSIS — Z720 Tobacco use: Secondary | ICD-10-CM | POA: Insufficient documentation

## 2015-02-14 DIAGNOSIS — E119 Type 2 diabetes mellitus without complications: Secondary | ICD-10-CM | POA: Insufficient documentation

## 2015-02-14 DIAGNOSIS — Z79899 Other long term (current) drug therapy: Secondary | ICD-10-CM | POA: Insufficient documentation

## 2015-02-14 DIAGNOSIS — Z87828 Personal history of other (healed) physical injury and trauma: Secondary | ICD-10-CM | POA: Diagnosis not present

## 2015-02-14 DIAGNOSIS — I10 Essential (primary) hypertension: Secondary | ICD-10-CM | POA: Insufficient documentation

## 2015-02-14 DIAGNOSIS — E781 Pure hyperglyceridemia: Secondary | ICD-10-CM | POA: Insufficient documentation

## 2015-02-14 DIAGNOSIS — M25551 Pain in right hip: Secondary | ICD-10-CM | POA: Diagnosis present

## 2015-02-14 DIAGNOSIS — Z7982 Long term (current) use of aspirin: Secondary | ICD-10-CM | POA: Diagnosis not present

## 2015-02-14 DIAGNOSIS — G8929 Other chronic pain: Secondary | ICD-10-CM | POA: Diagnosis not present

## 2015-02-14 DIAGNOSIS — M549 Dorsalgia, unspecified: Secondary | ICD-10-CM | POA: Diagnosis not present

## 2015-02-14 MED ORDER — METHOCARBAMOL 500 MG PO TABS
750.0000 mg | ORAL_TABLET | Freq: Once | ORAL | Status: AC
  Administered 2015-02-14: 750 mg via ORAL
  Filled 2015-02-14: qty 2

## 2015-02-14 MED ORDER — KETOROLAC TROMETHAMINE 30 MG/ML IJ SOLN
30.0000 mg | Freq: Once | INTRAMUSCULAR | Status: AC
  Administered 2015-02-14: 30 mg via INTRAMUSCULAR
  Filled 2015-02-14: qty 1

## 2015-02-14 NOTE — ED Notes (Signed)
Patient fell Tjhursday a week ago and her lower back was hurting.  Stated she picked up her hope chest and her back and hip started hurting worse.  Has been seen by MD for her hip but no xrays done on her hip  C/o right hip pain unable to bear weight on her leg

## 2015-02-15 ENCOUNTER — Emergency Department (HOSPITAL_COMMUNITY): Payer: Medicare Other

## 2015-02-15 DIAGNOSIS — M5431 Sciatica, right side: Secondary | ICD-10-CM | POA: Diagnosis not present

## 2015-02-15 DIAGNOSIS — M549 Dorsalgia, unspecified: Secondary | ICD-10-CM | POA: Diagnosis not present

## 2015-02-15 DIAGNOSIS — M25551 Pain in right hip: Secondary | ICD-10-CM | POA: Diagnosis not present

## 2015-02-15 MED ORDER — PREDNISONE 20 MG PO TABS
60.0000 mg | ORAL_TABLET | Freq: Once | ORAL | Status: AC
  Administered 2015-02-15: 60 mg via ORAL
  Filled 2015-02-15: qty 3

## 2015-02-15 MED ORDER — METHOCARBAMOL 750 MG PO TABS: 750.0000 mg | ORAL_TABLET | Freq: Three times a day (TID) | ORAL | Status: DC

## 2015-02-15 MED ORDER — PREDNISONE 20 MG PO TABS: ORAL_TABLET | ORAL | Status: DC

## 2015-02-15 NOTE — ED Notes (Signed)
Pt verbalized understanding of all of her discharge paperwork, prescriptions, and follow-up care.  Signature pad in room not working.      Pt able to ambulate, dress independently.

## 2015-02-15 NOTE — ED Provider Notes (Signed)
CSN: [id number]     Arrival date & time 02/14/15  2221 History   First MD Initiated Contact with Patient 02/14/15 2353     Chief Complaint  Patient presents with  . Hip Pain     (Consider location/radiation/quality/duration/timing/severity/associated sxs/prior Treatment) HPI Comments: Is a 64 year old female states she fell 10 days ago.  She was seen by her primary care physician and given muscle relaxer, which has not helped.  No x-rays were done.  She was seen again on Monday.  They changed some of her medications without any relief.  This Thursday.  She was moving furniture and pain moved from her back to her hip, similar to the sciatic pain that she has on the left now is on the right.  She's been taking her over-the-counter medications as well as her depression medications without relief.  She has now has pain that starts in the mid right, but radiates to her hip and down the posterior portion of her right leg.  She was able to drive her car here.  She is able to walk but is uncomfortable.  She has not lost control of bowel or bladder  Patient is a 64 y.o. female presenting with hip pain. The history is provided by the patient.  Hip Pain This is a new problem. The current episode started 1 to 4 weeks ago. The problem occurs constantly. The problem has been gradually worsening. Associated symptoms include arthralgias. Pertinent negatives include no coughing, nausea, numbness, vomiting or weakness. The symptoms are aggravated by exertion. Treatments tried: Her routine medications. The treatment provided no relief.    Past Medical History  Diagnosis Date  . Hypertension   . Diabetes mellitus without complication     diagnosed around 2010, only ever on metformin  . Chronic pain     neck pain, headache, neuropathy  . Hypertriglyceridemia   . Neuromuscular disorder   . COPD (chronic obstructive pulmonary disease)   . CHF (congestive heart failure)   . Depression   . Puncture wound of foot,  right 05/17/2012    Tetanus shot 3 yrs ago at [facility] in [place], per pt report   . Brachial plexus disorders    Past Surgical History  Procedure Laterality Date  . Rotator cuff repair    . Carpal tunnel release    . Abdominal hysterectomy    . Cholecystectomy     Family History  Problem Relation Age of Onset  . Hyperlipidemia Mother   . Heart attack Father 48  . Hypertension Father   . Cancer Paternal Grandfather     Lung cancer  . Cancer Maternal Grandmother   . Heart attack Maternal Grandmother    Social History  Substance Use Topics  . Smoking status: Current Every Day Smoker -- 0.50 packs/day for 41 years    Types: Cigarettes  . Smokeless tobacco: Never Used     Comment: Trying to cut back. 1/2PPD OR LESS  . Alcohol Use: No   OB History    No data available     Review of Systems  Respiratory: Negative for cough.   Gastrointestinal: Negative for nausea, vomiting, diarrhea and constipation.  Musculoskeletal: Positive for back pain and arthralgias.  Neurological: Negative for weakness and numbness.      Allergies  Flexeril and Soma  Home Medications   Prior to Admission medications   Medication Sig Start Date End Date Taking? Authorizing Provider  albuterol (PROVENTIL HFA;VENTOLIN HFA) 108 (90 BASE) MCG/ACT inhaler Inhale 1-2 puffs  into the lungs every 6 (six) hours as needed for wheezing or shortness of breath. 01/24/14  Yes Rushil Sherrye Payor, MD  amLODipine (NORVASC) 5 MG tablet take 1 tablet by mouth once daily 01/05/15  Yes Rushil Sherrye Payor, MD  aspirin 81 MG tablet Take 81 mg by mouth every morning.    Yes Historical Provider, MD  Blood Glucose Monitoring Suppl (ONE TOUCH ULTRA MINI) W/DEVICE KIT 1 Device by Does not apply route 2 (two) times daily. Sample provided to patient 01/24/14  Yes Aldine Contes, MD  butalbital-acetaminophen-caffeine (FIORICET, ESGIC) 50-325-40 MG per tablet Take 1 tablet by mouth at bedtime. 07/18/14  Yes Rushil Sherrye Payor, MD    DULoxetine (CYMBALTA) 60 MG capsule Take 1 capsule (60 mg total) by mouth every morning. 01/23/15  Yes Rushil Sherrye Payor, MD  gabapentin (NEURONTIN) 600 MG tablet Take 2 tablets (1,200 mg total) by mouth 3 (three) times daily. 01/23/15  Yes Rushil Sherrye Payor, MD  glipiZIDE (GLUCOTROL XL) 10 MG 24 hr tablet Take 1 tablet (10 mg total) by mouth daily with supper. 07/18/14  Yes Rushil Sherrye Payor, MD  glucose blood (ONE TOUCH ULTRA TEST) test strip Check blood sugar two times a day as instructed 09/15/14  Yes Rushil Sherrye Payor, MD  HYDROcodone-acetaminophen (NORCO/VICODIN) 5-325 MG per tablet Take 1-2 tablets by mouth every 6 (six) hours as needed for moderate pain. 02/13/15  Yes Ejiroghene Arlyce Dice, MD  ibuprofen (ADVIL,MOTRIN) 200 MG tablet Take 800 mg by mouth every 6 (six) hours as needed for moderate pain.   Yes Historical Provider, MD  lisinopril (PRINIVIL,ZESTRIL) 40 MG tablet take 1 tablet by mouth once daily 12/05/14  Yes Rushil Sherrye Payor, MD  metFORMIN (GLUCOPHAGE-XR) 750 MG 24 hr tablet Take 2 tablets (1,500 mg total) by mouth daily with breakfast. 01/29/15  Yes Rushil Sherrye Payor, MD  [facility] DELICA LANCETS FINE MISC Check blood sugar up to 2 times a  Day as instructed 09/15/14  Yes Rushil Sherrye Payor, MD  pantoprazole (PROTONIX) 20 MG tablet Take 1 tablet (20 mg total) by mouth daily. 07/18/14  Yes Rushil Sherrye Payor, MD  rosuvastatin (CRESTOR) 40 MG tablet Take 1 tablet (40 mg total) by mouth daily. 08/15/14  Yes Otho Bellows, MD  sitaGLIPtin (JANUVIA) 100 MG tablet Take 1 tablet (100 mg total) by mouth daily. 11/13/14  Yes Lucious Groves, DO  methocarbamol (ROBAXIN) 750 MG tablet Take 1 tablet (750 mg total) by mouth 3 (three) times daily. 02/15/15   Junius Creamer, NP  predniSONE (DELTASONE) 20 MG tablet 3 Tabs PO Days 1-3, then 2 tabs PO Days 4-6, then 1 tab PO Day 7-9, then Half Tab PO Day 10-12 02/15/15   Junius Creamer, NP   BP 134/75 mmHg  Pulse 85  Temp(Src) 98.5 F (36.9 C) (Oral)  Ht 5' 6"  (1.676 m)  Wt 189 lb 9 oz (85.985  kg)  BMI 30.61 kg/m2  SpO2 94% Physical Exam  Constitutional: She is oriented to person, place, and time. She appears well-developed and well-nourished.  HENT:  Head: Normocephalic.  Eyes: Pupils are equal, round, and reactive to light.  Neck: Normal range of motion.  Cardiovascular: Normal rate and regular rhythm.   Pulmonary/Chest: Effort normal and breath sounds normal.  Abdominal: Soft. She exhibits no distension. There is no tenderness.  Musculoskeletal: She exhibits tenderness. She exhibits no edema.       Back:  Neurological: She is alert and oriented to person, place, and time.  Skin: Skin  is warm. No rash noted.  Nursing note and vitals reviewed.   ED Course  Procedures (including critical care time) Labs Review Labs Reviewed - No data to display  Imaging Review Dg Lumbar Spine Complete  02/15/2015   CLINICAL DATA:  [place] while carrying heavy items yesterday, back and RIGHT hip pain. History of diabetes, hypertension.  EXAM: LUMBAR SPINE - COMPLETE 4+ VIEW  COMPARISON:  CT thoracolumbar spine July 11, 2014  FINDINGS: Lumbar vertebral bodies appear intact and aligned with maintenance of lumbar lordosis. Broad dextroscoliosis. Moderate to severe multilevel disc height loss, vacuum disc, endplate sclerosis and marginal spurring consistent with degenerative disc. No pars interarticularis defects. No destructive bony lesions. Moderate amount of retained large bowel stool partially imaged. Surgical clips in the included right abdomen compatible with cholecystectomy.  IMPRESSION: No acute fracture deformity or malalignment.  Similar degenerative change of the lumbar spine.  Moderate amount of retained large bowel stool partially imaged.   Electronically Signed   By: Elon Alas M.D.   On: 02/15/2015 01:28   Dg Hip Unilat With Pelvis 2-3 Views Right  02/15/2015   CLINICAL DATA:  [place] while carrying heavy items yesterday, back and RIGHT hip pain. History of diabetes, hypertension.   EXAM: DG HIP (WITH OR WITHOUT PELVIS) 2-3V RIGHT  COMPARISON:  None.  FINDINGS: There is no evidence of hip fracture or dislocation. Acetabular spurring. There is no evidence of arthropathy or other focal bone abnormality. Moderate amount of retained large bowel stool partially imaged.  IMPRESSION: No acute fracture deformity or dislocation.   Electronically Signed   By: Elon Alas M.D.   On: 02/15/2015 01:26   I have personally reviewed and evaluated these images and lab results as part of my medical decision-making.   EKG Interpretation None     Termination is consistent with sciatica with radiculopathy.  She'll be treated with 12 day stay right taper muscle relaxer.  She will continue taking her Neurontin, and follow-up with her primary care physician MDM   Final diagnoses:  Sciatica, right         Junius Creamer, NP 02/15/15 0018  Junius Creamer, NP 02/15/15 [id number]  Milton Ferguson, MD 02/15/15 1505

## 2015-02-15 NOTE — Discharge Instructions (Signed)
Take the medication as directed until completed follow up with your primary care physician

## 2015-02-17 ENCOUNTER — Telehealth: Payer: Self-pay | Admitting: Pharmacist

## 2015-02-17 NOTE — Telephone Encounter (Signed)
Steroid-Induced Hyperglycemia Prevention and Management [name] is a 64 y.o. female who meets criteria for [facility] quality improvement program (diabetes patient prescribed short courses of oral steroids).  A/P Current Regimen  Patient prescribed prednisone 20 mg [3 Tabs PO Days 1-3, then 2 tabs PO Days 4-6, then 1 tab PO Day 7-9, then Half Tab PO Day 10-12], currently on day 3 of therapy. Patient taking prednisone in the AM  Current DM regimen: metformin 1500 mg/day, sitagliptin 100 mg/day, glipizide 10 mg/day  Home BG Monitoring  Patient does check BG at home and does have a meter at home.  CBGs at home 160s-170s, lowest was 72 one week ago   A1C prior to steroid course 7.6  Patient does not report s/sx of hyper- or hypoglycemia  Medication Management  Additional treatment for BG control is not indicated at this time.  If needed, can consider increasing dose of metformin and/or glipizide  Patient Education  Advised patient to monitor BG while on steroid therapy (at least twice daily prior to first 2 meals of the day).  Patient educated about signs/symptoms and advised to contact clinic if hyper- or hypoglycemic.  Patient did  verbalize understanding of information and regimen by repeating back topics discussed.  Follow-up Daily while on prednisone  [name] J 5:28 PM 02/17/2015

## 2015-02-18 NOTE — Telephone Encounter (Signed)
Patient reports CBGs in 170s today. Will continue to follow patient while on prednisone therapy

## 2015-02-19 NOTE — Progress Notes (Signed)
Internal Medicine Clinic Attending  Case discussed with Dr. Emokpae soon after the resident saw the patient.  We reviewed the resident's history and exam and pertinent patient test results.  I agree with the assessment, diagnosis, and plan of care documented in the resident's note. 

## 2015-02-20 ENCOUNTER — Ambulatory Visit: Payer: Medicare Other | Admitting: Internal Medicine

## 2015-02-20 NOTE — Telephone Encounter (Signed)
Patient home BG in the 130s. Will follow-up on 02/23/15

## 2015-02-24 ENCOUNTER — Telehealth: Payer: Self-pay | Admitting: Dietician

## 2015-02-24 NOTE — Telephone Encounter (Signed)
CDE is calling to follow up on note that Dr.Patel sent about patient having a hard time getting her testing supplies. She denies having trouble getting strips now, but ran out of strips because she was chekcing more often while she was on prednisone. She is now off prednisone and has not checked her blood sugars since Saturday. She says while on steroids her blood sugars were "  really good then went real high, "376" on Saturday, then ran out of strips". She is going to pick test strips up today and will check several times prior to her visit tomorrow and if her blood sugars are still high this can be addressed at her visit.  She also may need increased testing supply rx if prescribed steroids again.

## 2015-02-25 ENCOUNTER — Ambulatory Visit (INDEPENDENT_AMBULATORY_CARE_PROVIDER_SITE_OTHER): Payer: Medicare Other | Admitting: Internal Medicine

## 2015-02-25 ENCOUNTER — Encounter: Payer: Self-pay | Admitting: Internal Medicine

## 2015-02-25 VITALS — BP 117/71 | HR 107 | Temp 97.9°F | Ht 66.0 in | Wt 188.0 lb

## 2015-02-25 DIAGNOSIS — M545 Low back pain: Secondary | ICD-10-CM | POA: Diagnosis not present

## 2015-02-25 DIAGNOSIS — M5441 Lumbago with sciatica, right side: Secondary | ICD-10-CM | POA: Diagnosis not present

## 2015-02-25 DIAGNOSIS — E1142 Type 2 diabetes mellitus with diabetic polyneuropathy: Secondary | ICD-10-CM | POA: Diagnosis not present

## 2015-02-25 LAB — GLUCOSE, CAPILLARY: Glucose-Capillary: 299 mg/dL — ABNORMAL HIGH (ref 65–99)

## 2015-02-25 LAB — POCT GLYCOSYLATED HEMOGLOBIN (HGB A1C): Hemoglobin A1C: 7

## 2015-02-25 MED ORDER — DICLOFENAC SODIUM 1 % TD GEL: 2.0000 g | Freq: Four times a day (QID) | TRANSDERMAL | Status: DC

## 2015-02-25 MED ORDER — OXYCODONE-ACETAMINOPHEN 10-325 MG PO TABS: 1.0000 | ORAL_TABLET | Freq: Three times a day (TID) | ORAL | Status: DC | PRN

## 2015-02-25 NOTE — Progress Notes (Signed)
   Subjective:    Patient ID: Glorie Dowlen, female    DOB: 03/29/1951, 64 y.o.   MRN: [id number]  HPI 64 y/o F w/ PMHx of HTN, DM2, OA of left knee, and peripheral neuropathy who presents to clinic for back pain. Please see problem list for further details.      Review of Systems  Constitutional: Negative for fever.  Cardiovascular: Negative for leg swelling.  Gastrointestinal: Negative for diarrhea.  Genitourinary: Negative for difficulty urinating.  Neurological: Negative for weakness.       Objective:   Physical Exam  Constitutional: She appears well-developed and well-nourished.  Cardiovascular:  No murmur heard. Pulmonary/Chest: Effort normal. No respiratory distress.  Musculoskeletal: She exhibits no edema.  Positive straight leg test on the right, full range of motion of right hip, tender to palpation of lateral buttock, no bruising noted, non tender to palpation of spine  Neurological: She is alert.  Skin: Skin is warm and dry.          Assessment & Plan:  Please see problem based assessment and plan.

## 2015-02-25 NOTE — Progress Notes (Signed)
Internal Medicine Clinic Attending  Case discussed with Dr. Truong at the time of the visit.  We reviewed the resident's history and exam and pertinent patient test results.  I agree with the assessment, diagnosis, and plan of care documented in the resident's note.  

## 2015-02-25 NOTE — Assessment & Plan Note (Addendum)
Pt returns to clinic today due to unrelieved back pain. This has been present now for 1 month. She states she was lifting a heavy chest and then fell backwards. Pain has remained unchanged from when it first started and is constant. Nothing makes pain better. She has tried robaxin, tylenol 3, ibuprofen 800mg  TID which she has been taking for the passt 3 weeks, oxycodone, icy hot, and prednisone all which have not helped. She was seen in the ED for this same complaint on 9/4 and imaging of thoracic and lumbar spine neg for fractures. Pain is a sharp shooting pain down to her rt leg which is different from her peripheral neuropathy which is a more of a tingling sensation. Denies saddle anesthesia, fevers, difficulty w/ urination. She also had a MRI of her spine in 09/2014 that was positive for degenerative disc changes. On exam she has a positive straight leg test on the right, full ROM of rt hip, and pain on palpation of her rt middle buttock w/o any bruises. This pain has been going on for 1 month now and sciatica pain should usually self resolve with time. Will control pain with percocet 10-325mg  q8h, voltaren gel, and referral to PT and sports medicine for possible spinal injections. BMET today as she reports continual use of ibuprofen 800mg  TID x 3 weeks.

## 2015-02-25 NOTE — Patient Instructions (Signed)
You can take percocet 10-325 every 8 hours as needed for pain. Apply voltaren gel to areas of pain. If you pain does not improve in 2 weeks then make an appointment to be seen in the clinic.   Make an appointment in clinic to review your glucometer and address your diabetes.

## 2015-02-26 LAB — BMP8+ANION GAP
ANION GAP: 17 mmol/L (ref 10.0–18.0)
BUN/Creatinine Ratio: 23 (ref 11–26)
BUN: 18 mg/dL (ref 8–27)
CALCIUM: 9.8 mg/dL (ref 8.7–10.3)
CO2: 22 mmol/L (ref 18–29)
CREATININE: 0.8 mg/dL (ref 0.57–1.00)
Chloride: 100 mmol/L (ref 97–108)
GFR calc Af Amer: 92 mL/min/{1.73_m2} (ref >59)
GFR, EST NON AFRICAN AMERICAN: 80 mL/min/{1.73_m2} (ref >59)
Glucose: 245 mg/dL — ABNORMAL HIGH (ref 65–99)
Potassium: 4.4 mmol/L (ref 3.5–5.2)
Sodium: 139 mmol/L (ref 134–144)

## 2015-02-26 NOTE — Assessment & Plan Note (Signed)
Pt did not bring her glucometer in today and prefers to schedule another visit w/ her PCP to discuss diabetes as her main complaint today is her back pain.

## 2015-03-02 ENCOUNTER — Ambulatory Visit: Payer: Medicare Other | Admitting: Family Medicine

## 2015-03-03 DIAGNOSIS — M25562 Pain in left knee: Secondary | ICD-10-CM | POA: Diagnosis not present

## 2015-03-05 ENCOUNTER — Encounter: Payer: Self-pay | Admitting: Internal Medicine

## 2015-03-05 ENCOUNTER — Ambulatory Visit (INDEPENDENT_AMBULATORY_CARE_PROVIDER_SITE_OTHER): Payer: Medicare Other | Admitting: Internal Medicine

## 2015-03-05 VITALS — BP 124/56 | HR 73 | Temp 98.4°F | Ht 66.0 in | Wt 186.1 lb

## 2015-03-05 DIAGNOSIS — L97929 Non-pressure chronic ulcer of unspecified part of left lower leg with unspecified severity: Secondary | ICD-10-CM

## 2015-03-05 DIAGNOSIS — M5441 Lumbago with sciatica, right side: Secondary | ICD-10-CM

## 2015-03-05 DIAGNOSIS — E559 Vitamin D deficiency, unspecified: Secondary | ICD-10-CM

## 2015-03-05 DIAGNOSIS — E118 Type 2 diabetes mellitus with unspecified complications: Secondary | ICD-10-CM

## 2015-03-05 DIAGNOSIS — R296 Repeated falls: Secondary | ICD-10-CM

## 2015-03-05 LAB — GLUCOSE, CAPILLARY: Glucose-Capillary: 176 mg/dL — ABNORMAL HIGH (ref 65–99)

## 2015-03-05 MED ORDER — OXYCODONE-ACETAMINOPHEN 10-325 MG PO TABS: 1.0000 | ORAL_TABLET | Freq: Three times a day (TID) | ORAL | Status: DC | PRN

## 2015-03-05 NOTE — Progress Notes (Signed)
Patient ID: [name], female   DOB: 1950-09-14, 64 y.o.   MRN: [id number]    Subjective:   Patient ID: [name] female   DOB: Jan 18, 1951 64 y.o.   MRN: [id number]  HPI: [name] is a 64 y.o. here for leg ulcer.    Past Medical History  Diagnosis Date  . Hypertension   . Diabetes mellitus without complication     diagnosed around 2010, only ever on metformin  . Chronic pain     neck pain, headache, neuropathy  . Hypertriglyceridemia   . Neuromuscular disorder   . COPD (chronic obstructive pulmonary disease)   . CHF (congestive heart failure)   . Depression   . Puncture wound of foot, right 05/17/2012    Tetanus shot 3 yrs ago at [facility] in [place], per pt report   . Brachial plexus disorders    Current Outpatient Prescriptions  Medication Sig Dispense Refill  . albuterol (PROVENTIL HFA;VENTOLIN HFA) 108 (90 BASE) MCG/ACT inhaler Inhale 1-2 puffs into the lungs every 6 (six) hours as needed for wheezing or shortness of breath. 1 Inhaler 3  . amLODipine (NORVASC) 5 MG tablet take 1 tablet by mouth once daily 30 tablet 6  . aspirin 81 MG tablet Take 81 mg by mouth every morning.     . Blood Glucose Monitoring Suppl (ONE TOUCH ULTRA MINI) W/DEVICE KIT 1 Device by Does not apply route 2 (two) times daily. Sample provided to patient 1 each 0  . diclofenac sodium (VOLTAREN) 1 % GEL Apply 2 g topically 4 (four) times daily. 1 Tube 3  . DULoxetine (CYMBALTA) 60 MG capsule Take 1 capsule (60 mg total) by mouth every morning. 90 capsule 3  . gabapentin (NEURONTIN) 600 MG tablet Take 2 tablets (1,200 mg total) by mouth 3 (three) times daily. 180 tablet 6  . glipiZIDE (GLUCOTROL XL) 10 MG 24 hr tablet Take 1 tablet (10 mg total) by mouth daily with supper. 90 tablet 3  . glucose blood (ONE TOUCH ULTRA TEST) test strip Check blood sugar two times a day as instructed 50 each 5  . ibuprofen (ADVIL,MOTRIN) 200 MG tablet Take 800 mg by mouth every 6 (six) hours as needed  for moderate pain.    Marland Kitchen lisinopril (PRINIVIL,ZESTRIL) 40 MG tablet take 1 tablet by mouth once daily 90 tablet 3  . metFORMIN (GLUCOPHAGE-XR) 750 MG 24 hr tablet Take 2 tablets (1,500 mg total) by mouth daily with breakfast. 180 tablet 3  . ONETOUCH DELICA LANCETS FINE MISC Check blood sugar up to 2 times a  Day as instructed 50 each 5  . oxyCODONE-acetaminophen (PERCOCET) 10-325 MG per tablet Take 1 tablet by mouth every 8 (eight) hours as needed for pain. 15 tablet 0  . pantoprazole (PROTONIX) 20 MG tablet Take 1 tablet (20 mg total) by mouth daily. 90 tablet 3  . rosuvastatin (CRESTOR) 40 MG tablet Take 1 tablet (40 mg total) by mouth daily. 30 tablet 11  . sitaGLIPtin (JANUVIA) 100 MG tablet Take 1 tablet (100 mg total) by mouth daily. 90 tablet 3  . butalbital-acetaminophen-caffeine (FIORICET, ESGIC) 50-325-40 MG per tablet Take 1 tablet by mouth at bedtime. (Patient not taking: Reported on 03/05/2015) 30 tablet 5   No current facility-administered medications for this visit.   Family History  Problem Relation Age of Onset  . Hyperlipidemia Mother   . Heart attack Father 16  . Hypertension Father   . Cancer Paternal Grandfather     Lung cancer  .  Cancer Maternal Grandmother   . Heart attack Maternal Grandmother    Social History   Social History  . Marital Status: Widowed    Spouse Name: N/A  . Number of Children: N/A  . Years of Education: 14   Occupational History  . unemployed    Social History Main Topics  . Smoking status: Current Every Day Smoker -- 0.50 packs/day for 41 years    Types: Cigarettes  . Smokeless tobacco: Never Used     Comment: Trying to cut back. 1/2PPD OR LESS  . Alcohol Use: No  . Drug Use: No  . Sexual Activity: Not Asked   Other Topics Concern  . None   Social History Narrative   Moved to [place] from [place] 04/25/2012, shortly after the death of her husband, to live with her daughter.  She notes significant life stress related to her 9  year-old grand-daughter with bipolar disorder.  She has previously worked as a Occupational psychologist.   Review of Systems: Review of Systems  Constitutional: Negative.  Negative for fever, chills, weight loss and malaise/fatigue.  Cardiovascular: Negative for claudication and leg swelling.  Gastrointestinal: Negative.   Musculoskeletal: Positive for falls. Negative for back pain.  Neurological: Positive for dizziness.  Endo/Heme/Allergies: Does not bruise/bleed easily.  Psychiatric/Behavioral: Negative for depression. The patient does not have insomnia.    Objective:  Physical Exam: Filed Vitals:   03/05/15 0835  BP: 124/56  Pulse: 73  Temp: 98.4 F (36.9 C)  TempSrc: Oral  Height: _0  (1.676 m)  Weight: 186 lb 1.6 oz (84.414 kg)  SpO2: 100%   Physical Exam  Constitutional: She is oriented to person, place, and time. She appears well-developed and well-nourished.  HENT:  Head: Normocephalic and atraumatic.  Eyes: EOM are normal.  Neck: Normal range of motion.  Cardiovascular: Normal rate, regular rhythm, normal heart sounds and intact distal pulses.   No murmur heard. Pulmonary/Chest: Effort normal.  Diffuse end expiratory wheezing heard b/l  Abdominal: Soft. Bowel sounds are normal. She exhibits no distension.  Neurological: She is alert and oriented to person, place, and time. She displays normal reflexes. Coordination normal.  Skin: Skin is warm.  Psychiatric: She has a normal mood and affect.  Extremity: 1 cm clean based ulcer with surrounding erythema and tenderness, and overlying fibrinous exudate on the medial malleolus of the left leg . No purulent drainage  Several healed "mrsa" lesions on the leg No calf tenderness, no edema of the legs   Assessment & Plan:  Please see problem based charting for assessment and plan

## 2015-03-05 NOTE — Assessment & Plan Note (Addendum)
Pt with recurrent falls 3 separate falls in the last 3 weeks. 1st one was when she was lifting a heavy chest with her daughter and her knee gave out and she fell on her hip. The remaining 2 occurred at night time at 3.30 AM when she was coming back from the bathroom when she suddenly felt dizzy and fell straight on the wooden floor on her arm which was bruised. Pt has sciatica with hip pain and that may be contributing to this. No LOC in any episode and no injury to head in any episode.  Orthostatics normal, do not suspect abuse   A/P Most likely mechanical fall and vasovagal syncope on the 2nd 2 times. Offered walker but patient refused Offered PT, but patient already doing it  -vitamin D level obtained

## 2015-03-05 NOTE — Patient Instructions (Signed)
Thank you for your visit today Please take care of the wound- do not expose it to water We have referred you to the wound care clinic, the clinic will call you to make the appointment  Please follow up in 2-3 weeks

## 2015-03-05 NOTE — Assessment & Plan Note (Signed)
Pt with 2 week history of painful ulcer on left leg- does not recall how it happened. Says she has had history of MRSA infections in the past.  Pt denies systemic symptoms  She has history of T2DM, and has neuropathy of the feet, also has smoking and HTN. She does not experience intermittent claudication  Exam showed 1 cm clean based ulcer with surrounding erythema and tenderness, and overlying fibrinous exudate on the medial malleolus of the left leg . No purulent drainage  Several healed "mrsa" lesions on the leg. No calf tenderness, no edema of the legs Also- feet were very dirty with poor care Pulses were intact  It is unlikely to be arterial ulcer as pulses were intact thruout, and less likely to be venous stasis ulcer. However, she has risk factors for both.  It may be due to her underlying diabetes and poor wound healing.  Plan No signs of infection currently so holding off on the antibiotics Counseled regarding wound care- like keeping it dry, etc. Referred for wound care, in case the wound worsens Advised feet care RTC in 2 weeks

## 2015-03-06 ENCOUNTER — Encounter: Payer: Self-pay | Admitting: Internal Medicine

## 2015-03-06 LAB — VITAMIN D 25 HYDROXY (VIT D DEFICIENCY, FRACTURES): Vit D, 25-Hydroxy: 23.2 ng/mL — ABNORMAL LOW (ref 30.0–100.0)

## 2015-03-06 NOTE — Progress Notes (Signed)
Patient ID: [name], female   DOB: [birth year], 64 y.o.   MRN: [id number]  I am completing paperwork today from [facility] that requires my signature:  Received 03/05/15: Preoperative clearance.  I'm going to defer further recommendations until I can see her back in office. She is due for follow-up visit in 2 weeks, and I would like to further investigate events that transpired in the interval since her last visit, notably falls and her back pain.

## 2015-03-10 ENCOUNTER — Ambulatory Visit: Payer: Medicare Other | Attending: Internal Medicine | Admitting: Physical Therapy

## 2015-03-10 DIAGNOSIS — M545 Low back pain: Secondary | ICD-10-CM | POA: Insufficient documentation

## 2015-03-10 DIAGNOSIS — M2560 Stiffness of unspecified joint, not elsewhere classified: Secondary | ICD-10-CM | POA: Diagnosis not present

## 2015-03-10 DIAGNOSIS — M5386 Other specified dorsopathies, lumbar region: Secondary | ICD-10-CM

## 2015-03-10 DIAGNOSIS — M62830 Muscle spasm of back: Secondary | ICD-10-CM | POA: Diagnosis not present

## 2015-03-10 DIAGNOSIS — R293 Abnormal posture: Secondary | ICD-10-CM

## 2015-03-10 NOTE — Therapy (Signed)
[place], [place], [address] Phone: [phone]   Fax:  [phone]  Physical Therapy Evaluation  Patient Details  Name: [name] MRN: [id number] Date of Birth: [birth year] Referring Provider:  Norman Herrlich, MD  Encounter Date: 03/10/2015      PT End of Session - 03/10/15 1632    Visit Number 1   Number of Visits 12   Date for PT Re-Evaluation 04/21/15   Authorization Type Medicare: progress note by 8th/9th visit, KX modifier by 15th visit.    PT Start Time 1545   PT Stop Time 1630   PT Time Calculation (min) 45 min   Activity Tolerance Patient tolerated treatment well   Behavior During Therapy WFL for tasks assessed/performed      Past Medical History  Diagnosis Date  . Hypertension   . Diabetes mellitus without complication     diagnosed around 2010, only ever on metformin  . Chronic pain     neck pain, headache, neuropathy  . Hypertriglyceridemia   . Neuromuscular disorder   . COPD (chronic obstructive pulmonary disease)   . Depression   . Puncture wound of foot, right 05/17/2012    Tetanus shot 3 yrs ago at [facility] in [place], per pt report   . Brachial plexus disorders     Past Surgical History  Procedure Laterality Date  . Rotator cuff repair    . Carpal tunnel release    . Abdominal hysterectomy    . Cholecystectomy      There were no vitals filed for this visit.  Visit Diagnosis:  Right low back pain, with sciatica presence unspecified - Plan: PT plan of care cert/re-cert  Decreased ROM of lumbar spine - Plan: PT plan of care cert/re-cert  Muscle spasm of back - Plan: PT plan of care cert/re-cert  Abnormal posture - Plan: PT plan of care cert/re-cert      Subjective Assessment - 03/10/15 1544    Subjective pt is 64 y.o F with CC of r  sided low back pain with R sided sciatica that started about 1 month ago, She reports it happened after carrying her Hope  chest she fell backward and landed on her hip. Pt reports that she was doing very good then her L knee went out and fell on her Right hip and caused the pain to climb in her R hip.    Pertinent History pt reported hx of L sided sciatica, and nueropathy in bil feet.    Limitations Sitting   How long can you sit comfortably? 30-60 min   How long can you stand comfortably? 30 min   How long can you walk comfortably? 30 min   Diagnostic tests 02/15/2015 degenerative change of the lumbar spine   Patient Stated Goals to figure out a way to relieve pain    Currently in Pain? Yes   Pain Score 3   took pain medication 1:30 pm   Pain Location Back   Pain Orientation Right   Pain Descriptors / Indicators Constant;Stabbing;Aching   Pain Type Chronic pain   Pain Radiating Towards down the right leg to the toes   Pain Onset 1 to 4 weeks ago   Pain Frequency Constant   Aggravating Factors  walking, sitting for longer periods of time, standing for longer periods of time.   Pain Relieving Factors percocet, ointment, ibuprofen            [facility] PT Assessment - 03/10/15  1549    Assessment   Medical Diagnosis r sided low back pain and r sciatica   Onset Date/Surgical Date --  about 1 month   Hand Dominance Left   Next MD Visit --  next week   Prior Therapy yes   Precautions   Precautions None   Restrictions   Weight Bearing Restrictions No   Balance Screen   Has the patient fallen in the past 6 months Yes   How many times? 3   Has the patient had a decrease in activity level because of a fear of falling?  No   Is the patient reluctant to leave their home because of a fear of falling?  No   Home Ecologist residence   Living Arrangements Children;Other relatives   Available Help at Discharge Available 24 hours/day;Available PRN/intermittently   Type of Home House   Home Access Stairs to enter   Entrance Stairs-Number of Steps 2   Entrance Stairs-Rails Right    Home Layout Two level   Alternate Level Stairs-Number of Steps 15   Alternate Level Stairs-Rails Right   Prior Function   Level of Independence Independent;Independent with basic ADLs   Vocation On disability;Retired   Leisure camping, Environmental consultant, sewing   Cognition   Overall Cognitive Status Within Functional Limits for tasks assessed   Observation/Other Assessments   Focus on Therapeutic Outcomes (FOTO)  46% limited  predicted 33% limited   Posture/Postural Control   Posture/Postural Control Postural limitations   Postural Limitations Rounded Shoulders;Forward head;Decreased lumbar lordosis   ROM / Strength   AROM / PROM / Strength AROM;Strength   AROM   AROM Assessment Site Lumbar   Lumbar Flexion 70  pain in the hip   Lumbar Extension 12   Lumbar - Right Side Bend 25   Lumbar - Left Side Bend 10   Strength   Strength Assessment Site Hip;Knee   Right/Left Hip Right;Left   Right Hip Flexion 4/5   Right Hip Extension 4/5   Right Hip ABduction 4/5   Right Hip ADduction 4+/5   Left Hip Flexion 4/5   Left Hip Extension 4/5   Left Hip ABduction 4/5   Left Hip ADduction 4+/5   Right/Left Knee Right;Left   Right Knee Flexion 4+/5   Right Knee Extension 4+/5   Left Knee Flexion 4+/5   Left Knee Extension 4+/5   Palpation   Spinal mobility hypomobility in L2-L5 with pain upon palaption and tenderness at the glute medius/ minimus and piriformis   Special Tests    Special Tests Lumbar   Lumbar Tests Slump Test;Prone Knee Bend Test;Straight Leg Raise   Slump test   Findings Negative   Side --  bil   Comment tightness at hamstring attachments   Prone Knee Bend Test   Findings Negative   Straight Leg Raise   Findings Negative   Ambulation/Gait   Gait Pattern Step-through pattern;Decreased stride length;Antalgic                           PT Education - 03/10/15 1632    Education provided Yes   Education Details evaluation findings, POC, goals, HEP    Person(s) Educated Patient   Methods Explanation   Comprehension Verbalized understanding          PT Short Term Goals - 03/10/15 1653    PT SHORT TERM GOAL #1   Title pt will be I with  initial HEP (03/31/2015)   Time 3   Period Weeks   Status New   PT SHORT TERM GOAL #2   Title pt will be able to verbalize and demonstrate techniques to reduce low back pain and reinjury via postural awarness, lifting and carrying mechanics, and HEP (03/31/2015)   Time 3   Period Weeks   Status New           PT Long Term Goals - 2015-04-07 1654    PT LONG TERM GOAL #1   Title pt will be I with all HEP given throughout therapy (04/21/2015)   Time 6   Period Weeks   Status New   PT LONG TERM GOAL #2   Title pt will increase her trunk mobility by > 15 degrees in all planes to assist with ADLs with < 2/10 pain (04/21/2015)   Time 6   Period Weeks   PT LONG TERM GOAL #3   Title pt will increase bil LE strengthening to >4+/5 in all planes to assist with safety during prolonged walking and standing acitivites (04/21/2015)   Time 6   Period Weeks   Status New   PT LONG TERM GOAL #4   Title pt will be able to stand/walk for > 1 hour with < 2/10 pain in the back or referral of pain down the leg to help with ADLs (04/21/2015)   Time 6   Period Weeks   Status New   PT LONG TERM GOAL #5   Title pt will increase her FOTO score to > 67 to demonstrate improved function at discharge (04/21/2015)   Time [place] - 2015/04/07 1633    Clinical Impression Statement Madora presents to OPPT with CC of R sided low back pain and R sided sciatica that started after falling onto her R hip when trying to move her Hope chest. She demontrates limited trunk mobility in all planes, and weakness noted in bil LE. special testing was negative for slump tesing with only pain going to her hamstring insertion. she noted reproduction of tinglig with prone on elbows and reported  centralization with manual traction of the R LE.  she exhibits hypombility of L3-L5 with tenderness in the glutes and piriformis. She exhibits increased lumbar lordosis, and exhibits an antalgic gait pattern with decreased step length bil. She would benefit from physical therapy to decrease her pain and improve function by addressing the impairments listed.    Pt will benefit from skilled therapeutic intervention in order to improve on the following deficits Abnormal gait;Decreased activity tolerance;Decreased endurance;Decreased range of motion;Pain;Improper body mechanics;Postural dysfunction;Decreased strength;Decreased mobility;Hypomobility;Increased muscle spasms   Rehab Potential Good   PT Frequency 2x / week   PT Duration 6 weeks   PT Treatment/Interventions ADLs/Self Care Home Management;Cryotherapy;Electrical Stimulation;Iontophoresis 4mg /ml Dexamethasone;Moist Heat;Traction;Ultrasound;Therapeutic activities;Therapeutic exercise;Patient/family education;Manual techniques;Passive range of motion;Dry needling;Taping   PT Next Visit Plan assess response to HEP, posture education, core strengthening, spinal mobility, lumbar traction?   PT Home Exercise Plan hamstring stretch, piriformis stretch, prone on elbows, pelvic tilts and decompression exercises.    Consulted and Agree with Plan of Care Patient          G-Codes - April 07, 2015 1701    Functional Assessment Tool Used FOTO 46% limited   Functional Limitation Changing and maintaining body position   Changing and Maintaining Body Position Current Status (U5427) At least  40 percent but less than 60 percent impaired, limited or restricted   Changing and Maintaining Body Position Goal Status (O8757) At least 20 percent but less than 40 percent impaired, limited or restricted       Problem List Patient Active Problem List   Diagnosis Date Noted  . Leg ulcer, left 03/05/2015  . Recurrent falls 03/05/2015  . Lumbago 02/06/2015  .  Immunization not carried out because of patient refusal 02/06/2015  . Primary osteoarthritis of left knee 09/22/2014  . Biceps tendinitis on left 09/22/2014  . Esophageal reflux 07/18/2014  . Strain of latissimus dorsi muscle 02/26/2014  . Unspecified constipation 02/26/2014  . Hyperlipidemia 02/26/2014  . Tobacco use disorder 07/22/2013  . Headache 06/28/2013  . Diabetic peripheral neuropathy associated with type 2 diabetes mellitus 06/14/2013  . Hx-TIA (transient ischemic attack) 03/10/2013  . Depression, major, recurrent 12/13/2012  . Health care maintenance 12/13/2012  . Abnormality of gait 09/18/2012  . Diastolic dysfunction 97/28/2060  . Type 2 diabetes, uncontrolled, with neuropathy 05/13/2012  . Hypertension 05/13/2012  . Hypertriglyceridemia 05/13/2012   Starr Lake PT, DPT, LAT, ATC  03/10/2015  5:04 PM      [facility] [address] [place], [place], [address] Phone: [phone]   Fax:  [phone]

## 2015-03-10 NOTE — Telephone Encounter (Signed)
Patient reports home BG still in 130s and was advised to contact me if greater than 180. Patient verbalized understanding.

## 2015-03-10 NOTE — Patient Instructions (Addendum)
   Kristoffer Leamon PT, DPT, LAT, ATC  [facility] Phone: [phone]     

## 2015-03-12 ENCOUNTER — Ambulatory Visit: Payer: Medicare Other | Admitting: Sports Medicine

## 2015-03-13 ENCOUNTER — Telehealth: Payer: Self-pay | Admitting: *Deleted

## 2015-03-13 NOTE — Telephone Encounter (Signed)
Call to patient call to give patient appointment for the [facility].  Patient is scheduled for 04/07/2015 at 9:15 AM.   Given location as well.  Sander Nephew, RN 03/13/2015 10:06 AM

## 2015-03-17 ENCOUNTER — Ambulatory Visit: Payer: Medicare Other | Attending: Internal Medicine | Admitting: Physical Therapy

## 2015-03-17 DIAGNOSIS — M5386 Other specified dorsopathies, lumbar region: Secondary | ICD-10-CM

## 2015-03-17 DIAGNOSIS — R293 Abnormal posture: Secondary | ICD-10-CM

## 2015-03-17 DIAGNOSIS — M62830 Muscle spasm of back: Secondary | ICD-10-CM | POA: Diagnosis not present

## 2015-03-17 DIAGNOSIS — M2560 Stiffness of unspecified joint, not elsewhere classified: Secondary | ICD-10-CM | POA: Insufficient documentation

## 2015-03-17 DIAGNOSIS — M545 Low back pain: Secondary | ICD-10-CM | POA: Insufficient documentation

## 2015-03-17 NOTE — Patient Instructions (Signed)
Prone pelvic press with leg extensions and donkey kicks issued from drawer 2 x per day 10 reps each side

## 2015-03-17 NOTE — Addendum Note (Signed)
Addended by: Hulan Fray on: 03/17/2015 08:33 PM   Modules accepted: Orders

## 2015-03-17 NOTE — Therapy (Signed)
[place], [place], [address] Phone: [phone]   Fax:  [phone]  Physical Therapy Treatment  Patient Details  Name: [name] MRN: [id number] Date of Birth: 02-Jan-1951 Referring Provider:  Riccardo Dubin, MD  Encounter Date: 03/17/2015      PT End of Session - 03/17/15 1056    Visit Number 2   Number of Visits 12   Date for PT Re-Evaluation 04/21/15   Authorization Type Medicare: progress note by 8th/9th visit, KX modifier by 15th visit.    PT Start Time 1050   PT Stop Time 1132   PT Time Calculation (min) 42 min      Past Medical History  Diagnosis Date  . Hypertension   . Diabetes mellitus without complication     diagnosed around 2010, only ever on metformin  . Chronic pain     neck pain, headache, neuropathy  . Hypertriglyceridemia   . Neuromuscular disorder   . COPD (chronic obstructive pulmonary disease)   . Depression   . Puncture wound of foot, right 05/17/2012    Tetanus shot 3 yrs ago at [facility] in [place], per pt report   . Brachial plexus disorders     Past Surgical History  Procedure Laterality Date  . Rotator cuff repair    . Carpal tunnel release    . Abdominal hysterectomy    . Cholecystectomy      There were no vitals filed for this visit.  Visit Diagnosis:  Right low back pain, with sciatica presence unspecified  Decreased ROM of lumbar spine  Muscle spasm of back  Abnormal posture      Subjective Assessment - 03/17/15 1054    Subjective My hip and back are doing better. My left knee is acting up. Waiting on approval for knee replacement. My right foot has been numb all week but that may be neuropathy. My right knee gave out 4 times in [place]. No pain, just gave out.    Currently in Pain? Yes   Pain Score 3    Aggravating Factors  walking and standing too long   Pain Relieving Factors percocet, ointment, ibuprofen                          [facility] Adult PT Treatment/Exercise - 03/17/15 0001    Lumbar Exercises: Stretches   Active Hamstring Stretch 3 reps;30 seconds   Pelvic Tilt Limitations 10 reps at 5 seconds each   Prone on Elbows Stretch 60 seconds   Prone on Elbows Stretch Limitations no change in numbness   Press Ups 5 reps   Press Ups Limitations no change in numbness   Piriformis Stretch 3 reps;30 seconds   Piriformis Stretch Limitations figure 4    Lumbar Exercises: Aerobic   Stationary Bike Nustep L 3 x 5 minutesl UE/LE    Lumbar Exercises: Prone   Other Prone Lumbar Exercises prone hip extesnions x 10 each side, x 10 donkey kicks each side- right side demonstrates weakness     Decompression position with feet in chair x 2 minutes           PT Education - 03/17/15 1132    Education provided Yes   Education Details Prone hip extension, donkey kicks   Person(s) Educated Patient   Methods Explanation;Handout   Comprehension Verbalized understanding          PT Short Term Goals - 03/17/15 1119  PT SHORT TERM GOAL #1   Title pt will be I with initial HEP (03/31/2015)   Time 3   Period Weeks   Status On-going   PT SHORT TERM GOAL #2   Title pt will be able to verbalize and demonstrate techniques to reduce low back pain and reinjury via postural awarness, lifting and carrying mechanics, and HEP (03/31/2015)   Time 3   Period Weeks   Status On-going           PT Long Term Goals - 03/10/15 1654    PT LONG TERM GOAL #1   Title pt will be I with all HEP given throughout therapy (04/21/2015)   Time 6   Period Weeks   Status New   PT LONG TERM GOAL #2   Title pt will increase her trunk mobility by > 15 degrees in all planes to assist with ADLs with < 2/10 pain (04/21/2015)   Time 6   Period Weeks   PT LONG TERM GOAL #3   Title pt will increase bil LE strengthening to >4+/5 in all planes to assist with safety during prolonged walking and standing  acitivites (04/21/2015)   Time 6   Period Weeks   Status New   PT LONG TERM GOAL #4   Title pt will be able to stand/walk for > 1 hour with < 2/10 pain in the back or referral of pain down the leg to help with ADLs (04/21/2015)   Time 6   Period Weeks   Status New   PT LONG TERM GOAL #5   Title pt will increase her FOTO score to > 67 to demonstrate improved function at discharge (04/21/2015)   Time 6   Period Weeks   Status New               Plan - 03/17/15 1109    Clinical Impression Statement Pt presents with decreased hip and LBP. Reveiw of HEP. She has difficulty finding place to perform supine exercises due to sleeping in a recliner for 15 years. Reviewed HEP as well as prone pelvic press with hip extensions and donkey kicks. Pt will try on air mattress. We may need to modify to sitting/standing HEP if pt unable to perform HEP on air mattress   PT Next Visit Plan assess response to HEP on air mattress and modify position if needed, posture education, core strengthening, spinal mobility, lumbar traction?        Problem List Patient Active Problem List   Diagnosis Date Noted  . Leg ulcer, left ([place]) 03/05/2015  . Recurrent falls 03/05/2015  . Lumbago 02/06/2015  . Immunization not carried out because of patient refusal 02/06/2015  . Primary osteoarthritis of left knee 09/22/2014  . Biceps tendinitis on left 09/22/2014  . Esophageal reflux 07/18/2014  . Strain of latissimus dorsi muscle 02/26/2014  . Unspecified constipation 02/26/2014  . Hyperlipidemia 02/26/2014  . Tobacco use disorder 07/22/2013  . Headache 06/28/2013  . Diabetic peripheral neuropathy associated with type 2 diabetes mellitus ([place]) 06/14/2013  . Hx-TIA (transient ischemic attack) 03/10/2013  . Depression, major, recurrent ([place]) 12/13/2012  . Health care maintenance 12/13/2012  . Abnormality of gait 09/18/2012  . Diastolic dysfunction 12/07/9483  . Type 2 diabetes, uncontrolled, with neuropathy  ([place]) 05/13/2012  . Hypertension 05/13/2012  . Hypertriglyceridemia 05/13/2012    Dorene Ar , PTA  03/17/2015, 11:36 AM  [facility] [address] [place], [place], [address] Phone: [phone]  Fax:  [phone]

## 2015-03-19 ENCOUNTER — Ambulatory Visit (INDEPENDENT_AMBULATORY_CARE_PROVIDER_SITE_OTHER): Payer: Medicare Other | Admitting: Internal Medicine

## 2015-03-19 ENCOUNTER — Encounter: Payer: Self-pay | Admitting: Internal Medicine

## 2015-03-19 VITALS — BP 123/75 | HR 76 | Temp 97.9°F | Ht 66.0 in | Wt 189.0 lb

## 2015-03-19 DIAGNOSIS — R296 Repeated falls: Secondary | ICD-10-CM

## 2015-03-19 DIAGNOSIS — M545 Low back pain: Secondary | ICD-10-CM | POA: Diagnosis not present

## 2015-03-19 DIAGNOSIS — Z01818 Encounter for other preprocedural examination: Secondary | ICD-10-CM | POA: Diagnosis not present

## 2015-03-19 DIAGNOSIS — Z0000 Encounter for general adult medical examination without abnormal findings: Secondary | ICD-10-CM

## 2015-03-19 DIAGNOSIS — R293 Abnormal posture: Secondary | ICD-10-CM | POA: Diagnosis not present

## 2015-03-19 DIAGNOSIS — E1165 Type 2 diabetes mellitus with hyperglycemia: Secondary | ICD-10-CM | POA: Diagnosis not present

## 2015-03-19 DIAGNOSIS — M2560 Stiffness of unspecified joint, not elsewhere classified: Secondary | ICD-10-CM | POA: Diagnosis not present

## 2015-03-19 DIAGNOSIS — E1140 Type 2 diabetes mellitus with diabetic neuropathy, unspecified: Secondary | ICD-10-CM

## 2015-03-19 DIAGNOSIS — M62830 Muscle spasm of back: Secondary | ICD-10-CM | POA: Diagnosis not present

## 2015-03-19 DIAGNOSIS — IMO0002 Reserved for concepts with insufficient information to code with codable children: Secondary | ICD-10-CM

## 2015-03-19 DIAGNOSIS — L97929 Non-pressure chronic ulcer of unspecified part of left lower leg with unspecified severity: Secondary | ICD-10-CM | POA: Diagnosis not present

## 2015-03-19 DIAGNOSIS — G44221 Chronic tension-type headache, intractable: Secondary | ICD-10-CM | POA: Diagnosis not present

## 2015-03-19 LAB — GLUCOSE, CAPILLARY: Glucose-Capillary: 97 mg/dL (ref 65–99)

## 2015-03-19 MED ORDER — BUTALBITAL-APAP-CAFFEINE 50-325-40 MG PO TABS: 1.0000 | ORAL_TABLET | Freq: Every day | ORAL | Status: DC

## 2015-03-19 NOTE — Assessment & Plan Note (Signed)
Risk factors for her include history of TIA in 2014, ongoing tobacco abuse, well-controlled type 2 diabetes, grade 1 diastolic dysfunction, hypertension.   History of TIA: She reports adherence to rosuvastatin 40 mg daily and has not had any neurologic symptoms since 2014.  Ongoing tobacco abuse: She reports smoking 1/3-1/2 packs per day 40 years. She does have an albuterol inhaler which she has not used in months and denies any limitations to her performance of ADLs her activities around the house, like chores or taking care of her 38-month-old great-grandson.  Well-controlled type 2 diabetes: V8F  Grade 1 diastolic dysfunction: She denies any symptoms of dyspnea with exertion or anginal type chest pain or prior history of MI. Myoview in 2015 was interpreted as low risk. EKG was repeated today in the office and compared to prior from 09/29/13 and was unremarkable for any Q waves or new ST changes.  Hypertension: Blood pressure today is 123/75 and has been well controlled over the last 2 visits on medications alone: Amlodipine 5 mg, lisinopril 40 mg.  She she was assessed to be a low risk for an intermediate risk procedure and was advised that she will need to stop smoking 4-6 weeks prior to her procedure to which she acknowledged understanding.

## 2015-03-19 NOTE — Assessment & Plan Note (Signed)
She reports that there have been no changes to her ulcer since her last appointment and has been following instructions provided to her. She was advised that if she were to have worsening redness, swelling, tenderness, she would need to come back for follow-up appointment. [facility] and was provided the information.

## 2015-03-19 NOTE — Assessment & Plan Note (Signed)
She was advised she needed to repeat a mammogram as it had been 2 years since her last one to which she agreed and would schedule the appointment herself.

## 2015-03-19 NOTE — Assessment & Plan Note (Signed)
CBG 97 today. She reports her blood sugars are mainly been 100s with occasional elevations in the low 200s but never more than 250. She reports she is still taking her metformin, Januvia, glipizide.

## 2015-03-19 NOTE — Assessment & Plan Note (Signed)
Since her last appointment, she denies any new falls. In reviewing the details of the 3 falls that she has had the last 1-2 months, she denies any episodes of syncope and states that she recalls the events clearly. She is currently undergoing physical therapy for her right hip and feels that until her left knee is addressed, she will still be at risk for falling.

## 2015-03-19 NOTE — Progress Notes (Signed)
Medicine attending: Medical history, presenting problems, physical findings, and medications, reviewed with Dr Rushil Patel and I concur with his evaluation and management plan. 

## 2015-03-19 NOTE — Progress Notes (Signed)
   Subjective:    Patient ID: [name], female    DOB: Sep 01, 1950, 64 y.o.   MRN: [id number]  HPI [name] is a 64 year old female with well-controlled type 2 diabetes with neuropathy, osteoarthritis, morbid obesity, history of TIA, hypertension who presents today for preoperative clearance for knee replacement surgery. Please see assessment & plan for documentation of chronic medical problems.    Review of Systems  Respiratory: Negative for shortness of breath and wheezing.   Cardiovascular: Negative for chest pain.  Musculoskeletal: Positive for back pain and joint swelling.  Skin: Positive for wound.  Neurological: Negative for syncope and numbness.       Objective:   Physical Exam Constitutional: Obese, Caucasian female. No distress.  Head: Normocephalic and atraumatic.  Eyes: Conjunctivae are normal. Pupils are 3 mm, direct, consensual, near.  Cardiovascular: Normal rate, regular rhythm and normal heart sounds.  No gallop, friction rub, murmur heard. Pulmonary/Chest: Effort normal. No respiratory distress. No wheezes, rales.  Neurological: Alert and oriented to person, place, and time. Coordination normal.  Extremities: Left knee mildly larger than right knee with some swelling. Skin: Ulcer present just superior to the medial malleolus on left lower extremity with minimal erythema surrounding opening though no tenderness or drainage noted.  Psychiatric: Affect bright.         Assessment & Plan:

## 2015-03-19 NOTE — Patient Instructions (Addendum)
I have refilled your Fioricet today.  We will get in touch with your orthopedic doctors about giving you medical clearance to proceed further.   Please follow-up in 2 months.

## 2015-03-19 NOTE — Assessment & Plan Note (Signed)
Refilled Fioricet 30 tablets today.

## 2015-03-24 ENCOUNTER — Ambulatory Visit: Payer: Medicare Other | Admitting: Physical Therapy

## 2015-03-24 DIAGNOSIS — M545 Low back pain: Secondary | ICD-10-CM | POA: Diagnosis not present

## 2015-03-24 DIAGNOSIS — M5386 Other specified dorsopathies, lumbar region: Secondary | ICD-10-CM

## 2015-03-24 DIAGNOSIS — R293 Abnormal posture: Secondary | ICD-10-CM | POA: Diagnosis not present

## 2015-03-24 DIAGNOSIS — M62830 Muscle spasm of back: Secondary | ICD-10-CM

## 2015-03-24 DIAGNOSIS — M2560 Stiffness of unspecified joint, not elsewhere classified: Secondary | ICD-10-CM | POA: Diagnosis not present

## 2015-03-24 NOTE — Progress Notes (Signed)
I saw and evaluated the patient.  I personally confirmed the key portions of the history and exam documented by Dr. Tiburcio Pea and I reviewed pertinent patient test results.  The assessment, diagnosis, and plan were formulated together and I agree with the documentation in the resident's note.

## 2015-03-24 NOTE — Therapy (Signed)
[place], [place], [address] Phone: [phone]   Fax:  [phone]  Physical Therapy Treatment  Patient Details  Name: [name] MRN: [id number] Date of Birth: 1950-08-21 Referring Provider:  Riccardo Dubin, MD  Encounter Date: 03/24/2015      PT End of Session - 03/24/15 1626    Visit Number 3   Number of Visits 12   Date for PT Re-Evaluation 04/21/15   PT Start Time 0814   PT Stop Time 1629   PT Time Calculation (min) 40 min   Activity Tolerance Patient tolerated treatment well;No increased pain   Behavior During Therapy [facility] for tasks assessed/performed      Past Medical History  Diagnosis Date  . Hypertension   . Diabetes mellitus without complication ([place])     diagnosed around 2010, only ever on metformin  . Chronic pain     neck pain, headache, neuropathy  . Hypertriglyceridemia   . Neuromuscular disorder ([place])   . COPD (chronic obstructive pulmonary disease) ([place])   . Depression   . Puncture wound of foot, right 05/17/2012    Tetanus shot 3 yrs ago at [facility] in [place], per pt report   . Brachial plexus disorders     Past Surgical History  Procedure Laterality Date  . Rotator cuff repair    . Carpal tunnel release    . Abdominal hysterectomy    . Cholecystectomy      There were no vitals filed for this visit.  Visit Diagnosis:  Right low back pain, with sciatica presence unspecified  Decreased ROM of lumbar spine  Muscle spasm of back  Abnormal posture      Subjective Assessment - 03/24/15 1602    Currently in Pain? Yes   Pain Score 3    Pain Location Knee   Pain Orientation Left   Pain Descriptors / Indicators Aching   Pain Frequency Constant   Aggravating Factors  walking and standing too long.     Pain Relieving Factors change of position                         [facility] Adult PT Treatment/Exercise - 03/24/15 1617    Self-Care   Self-Care ADL's;Posture   Lumbar Exercises: Aerobic   Stationary Bike Nustep 10 minutes L4 arms legs   Lumbar Exercises: Standing   Heel Raises 10 reps   Wall Slides 10 reps  CGA   Other Standing Lumbar Exercises Hip circles 5 REPS each   Lumbar Exercises: Seated   Long Arc Quad on Chair 5 reps   Other Seated Lumbar Exercises hip flexion with Neutral spine, some compensation   Knee/Hip Exercises: Standing   Hip Extension 10 reps                PT Education - 03/24/15 1626    Education provided Yes   Education Details posture and body mechanics.     Person(s) Educated Patient   Methods Explanation;Demonstration;Handout   Comprehension Verbalized understanding          PT Short Term Goals - 03/24/15 1629    PT SHORT TERM GOAL #1   Title pt will be I with initial HEP (03/31/2015)   Time 3   Period Weeks   Status On-going   PT SHORT TERM GOAL #2   Title pt will be able to verbalize and demonstrate techniques to reduce low back pain and reinjury via  postural awarness, lifting and carrying mechanics, and HEP (03/31/2015)   Baseline info today, uses many techniques   Time 3   Period Weeks   Status Achieved           PT Long Term Goals - 03/10/15 1654    PT LONG TERM GOAL #1   Title pt will be I with all HEP given throughout therapy (04/21/2015)   Time 6   Period Weeks   Status New   PT LONG TERM GOAL #2   Title pt will increase her trunk mobility by > 15 degrees in all planes to assist with ADLs with < 2/10 pain (04/21/2015)   Time 6   Period Weeks   PT LONG TERM GOAL #3   Title pt will increase bil LE strengthening to >4+/5 in all planes to assist with safety during prolonged walking and standing acitivites (04/21/2015)   Time 6   Period Weeks   Status New   PT LONG TERM GOAL #4   Title pt will be able to stand/walk for > 1 hour with < 2/10 pain in the back or referral of pain down the leg to help with ADLs (04/21/2015)   Time 6   Period Weeks   Status  New   PT LONG TERM GOAL #5   Title pt will increase her FOTO score to > 67 to demonstrate improved function at discharge (04/21/2015)   Time 6   Period Weeks   Status New               Plan - 03/24/15 1627    Clinical Impression Statement Rt hip pain resolved.  Lt knee gives out.  able to use good posture techniques in the home.  Air bed did not work .     PT Next Visit Plan answer any ADL questions.  Standing stabilization.  Try calf and hamstring stretches.     PT Home Exercise Plan ADL's with good posture or delegate   Consulted and Agree with Plan of Care Patient        Problem List Patient Active Problem List   Diagnosis Date Noted  . Pre-op evaluation 03/19/2015  . Leg ulcer, left ([place]) 03/05/2015  . Recurrent falls 03/05/2015  . Lumbago 02/06/2015  . Primary osteoarthritis of left knee 09/22/2014  . Esophageal reflux 07/18/2014  . Strain of latissimus dorsi muscle 02/26/2014  . Unspecified constipation 02/26/2014  . Hyperlipidemia 02/26/2014  . Tobacco use disorder 07/22/2013  . Headache 06/28/2013  . Diabetic peripheral neuropathy associated with type 2 diabetes mellitus ([place]) 06/14/2013  . Hx-TIA (transient ischemic attack) 03/10/2013  . Depression, major, recurrent ([place]) 12/13/2012  . Health care maintenance 12/13/2012  . Diastolic dysfunction 92/04/9416  . Type 2 diabetes, uncontrolled, with neuropathy ([place]) 05/13/2012  . Hypertension 05/13/2012    HARRIS,KAREN 03/24/2015, 4:31 PM  [facility] [address] [place], [place], [address] Phone: [phone]   Fax:  [phone]     Melvenia Needles, PTA 03/24/2015 4:31 PM Phone: [phone] Fax: [phone]

## 2015-03-24 NOTE — Patient Instructions (Signed)

## 2015-03-25 ENCOUNTER — Ambulatory Visit: Payer: Medicare Other | Admitting: Physical Therapy

## 2015-03-26 ENCOUNTER — Encounter: Payer: Self-pay | Admitting: Internal Medicine

## 2015-03-26 DIAGNOSIS — M1712 Unilateral primary osteoarthritis, left knee: Secondary | ICD-10-CM | POA: Diagnosis not present

## 2015-03-26 NOTE — Progress Notes (Signed)
Patient ID: [name], female   DOB: [birth year], 64 y.o.   MRN: [id number]  I am completing paperwork today from [facility] that requires my signature:  Received 03/25/15: authorization for pre-operative medical clearance. As noted in my office visit note, her chronic conditions are controlled, and she needs to stop smoking 4-6 weeks prior to her procedure.

## 2015-03-31 ENCOUNTER — Ambulatory Visit: Payer: Medicare Other | Admitting: Physical Therapy

## 2015-03-31 DIAGNOSIS — M2560 Stiffness of unspecified joint, not elsewhere classified: Secondary | ICD-10-CM | POA: Diagnosis not present

## 2015-03-31 DIAGNOSIS — M62830 Muscle spasm of back: Secondary | ICD-10-CM

## 2015-03-31 DIAGNOSIS — R293 Abnormal posture: Secondary | ICD-10-CM

## 2015-03-31 DIAGNOSIS — M5386 Other specified dorsopathies, lumbar region: Secondary | ICD-10-CM

## 2015-03-31 DIAGNOSIS — M545 Low back pain: Secondary | ICD-10-CM

## 2015-03-31 NOTE — Therapy (Addendum)
[place], [place], [address] Phone: [phone]   Fax:  [phone]  Physical Therapy Treatment  Patient Details  Name: [name] MRN: [id number] Date of Birth: January 07, 1951 No Data Recorded  Encounter Date: 03/31/2015      PT End of Session - 03/31/15 1731    Visit Number 4   Number of Visits 12   Date for PT Re-Evaluation 04/21/15   PT Start Time 6378   PT Stop Time 5885   PT Time Calculation (min) 41 min   Activity Tolerance Patient tolerated treatment well;No increased pain   Behavior During Therapy [facility] for tasks assessed/performed      Past Medical History  Diagnosis Date  . Hypertension   . Diabetes mellitus without complication ([place])     diagnosed around 2010, only ever on metformin  . Chronic pain     neck pain, headache, neuropathy  . Hypertriglyceridemia   . Neuromuscular disorder ([place])   . COPD (chronic obstructive pulmonary disease) ([place])   . Depression   . Puncture wound of foot, right 05/17/2012    Tetanus shot 3 yrs ago at [facility] in [place], per pt report   . Brachial plexus disorders     Past Surgical History  Procedure Laterality Date  . Rotator cuff repair    . Carpal tunnel release    . Abdominal hysterectomy    . Cholecystectomy      There were no vitals filed for this visit.  Visit Diagnosis:  Right low back pain, with sciatica presence unspecified  Decreased ROM of lumbar spine  Muscle spasm of back  Abnormal posture      Subjective Assessment - 03/31/15 1632    Subjective It has been a week since any back pain or leg pain.  No questions about posture with ADL's, she has been doing most of thoses already.  Knee surgery DEC 9th scheduled for total knee.     Currently in Pain? No/denies                         [facility] Adult PT Treatment/Exercise - 03/31/15 1635    Self-Care   Self-Care --  location of medical supply, exercise  advice.  shoes   Lumbar Exercises: Stretches   Passive Hamstring Stretch 3 reps;30 seconds   Lower Trunk Rotation 3 reps   Standing Side Bend 1 rep;10 seconds   Standing Side Bend Limitations LT24 degrees, RT 20 degrees AROM   Standing Extension 1 rep;10 seconds   Standing Extension Limitations 25 degrees   Piriformis Stretch 1 rep;30 seconds   Piriformis Stretch Limitations figure 4   Lumbar Exercises: Aerobic   Stationary Bike Nustep 8 minutes , L5   Lumbar Exercises: Standing   Wall Slides 10 reps  10 seconds   Other Standing Lumbar Exercises hip circles 10X each directon   Other Standing Lumbar Exercises ROM flexion, trunk 70 degrees.   Lumbar Exercises: Supine   Bridge 10 reps   Other Supine Lumbar Exercises HIP flex 4+/5ABD  44+/5 ABD ADD 4/5,RT and LT,  LT AbD 5/5 ,, EXTENSION 4+/5  Both  QUads 4/5  Hamstrings 4+/5                PT Education - 03/31/15 1731    Education provided Yes   Education Details Self care info   Person(s) Educated Patient   Methods Explanation   Comprehension Verbalized understanding  PT Short Term Goals - 03/31/15 1634    PT SHORT TERM GOAL #1   Title pt will be I with initial HEP (03/31/2015)   Baseline understands all her exercises.  About 30 minutes a day.     Time 3   Period Weeks   Status Achieved   PT SHORT TERM GOAL #2   Time 3   Period Weeks   Status Achieved           PT Long Term Goals - 03/31/15 1635    PT LONG TERM GOAL #1   Title pt will be I with all HEP given throughout therapy (04/21/2015)   Baseline understands all   Time 6   Period Weeks   Status Achieved   PT LONG TERM GOAL #3   Title pt will increase bil LE strengthening to >4+/5 in all planes to assist with safety during prolonged walking and standing acitivites (04/21/2015)   Baseline 4+/5 to 5/5   Time 6   Period Weeks   Status Achieved   PT LONG TERM GOAL #4   Title pt will be able to stand/walk for > 1 hour with < 2/10 pain in the  back or referral of pain down the leg to help with ADLs (04/21/2015) NO longer has sciatic leg pain   Baseline Stands 15 minutes at a time.  Limited by her feet vs her back /leg pain.    Time 6   Period Weeks   Status Partially Met   PT LONG TERM GOAL #5   Title pt will increase her FOTO score to > 67 to demonstrate improved function at discharge (04/21/2015)   Time 6   Period Weeks               Plan - 03/31/15 1732    Clinical Impression Statement Patient fell 1X since last visit tripping over anchoring string from a inflatable Halloween decoration in her yard.  MD instructed her to get a walker.  She has not had any pain and wants to be discharged Most goals met or are partially met.         Problem List Patient Active Problem List   Diagnosis Date Noted  . Pre-op evaluation 03/19/2015  . Leg ulcer, left ([place]) 03/05/2015  . Recurrent falls 03/05/2015  . Lumbago 02/06/2015  . Primary osteoarthritis of left knee 09/22/2014  . Esophageal reflux 07/18/2014  . Strain of latissimus dorsi muscle 02/26/2014  . Unspecified constipation 02/26/2014  . Hyperlipidemia 02/26/2014  . Tobacco use disorder 07/22/2013  . Headache 06/28/2013  . 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 Hx-TIA (transient ischemic attack) 03/10/2013  . Depression, major, recurrent ([place]) 12/13/2012  . Health care maintenance 12/13/2012  . Diastolic dysfunction 95/62/1308  . Type 2 diabetes, uncontrolled, with neuropathy ([place]) 05/13/2012  . Hypertension 05/13/2012    HARRIS,KAREN 03/31/2015, 5:38 PM  [facility] [address] [place], [place], [address] Phone: [phone]   Fax:  [phone]  Name: Demiah Gullickson MRN: [id number] Date of Birth: [birth year]    Melvenia Needles, PTA 03/31/2015 5:38 PM Phone: [phone] Fax: [phone]      PHYSICAL THERAPY DISCHARGE SUMMARY  Visits from  Start of Care: 4  Current functional level related to goals / functional outcomes: FOTO 46% limited   Remaining deficits: Pt reported no pain, no new measurements assessed due to pt not returning since her last visit.    Education / Equipment:  HEP   Plan: Patient agrees to discharge.  Patient goals were not met. Patient is being discharged due to being pleased with the current functional level.  ?????        Kristoffer Leamon PT, DPT, LAT, ATC  04/10/2015  7:57 AM

## 2015-04-02 ENCOUNTER — Ambulatory Visit: Payer: Medicare Other | Admitting: Physical Therapy

## 2015-04-07 ENCOUNTER — Ambulatory Visit: Payer: Medicare Other | Admitting: Physical Therapy

## 2015-04-07 ENCOUNTER — Encounter (HOSPITAL_BASED_OUTPATIENT_CLINIC_OR_DEPARTMENT_OTHER): Payer: Medicare Other | Attending: General Surgery

## 2015-04-08 ENCOUNTER — Other Ambulatory Visit: Payer: Self-pay | Admitting: Internal Medicine

## 2015-04-08 DIAGNOSIS — E785 Hyperlipidemia, unspecified: Secondary | ICD-10-CM

## 2015-04-08 MED ORDER — ROSUVASTATIN CALCIUM 40 MG PO TABS: 40.0000 mg | ORAL_TABLET | Freq: Every day | ORAL | Status: DC

## 2015-04-09 ENCOUNTER — Telehealth: Payer: Self-pay | Admitting: *Deleted

## 2015-04-09 ENCOUNTER — Ambulatory Visit: Payer: Medicare Other | Admitting: Physical Therapy

## 2015-04-09 NOTE — Telephone Encounter (Signed)
Spoke with patient regarding her wound center appointment that she did not keep. Patient states that the wound had healed on its own.  Patient was instructed to call office if need arise for her to be seen regarding her wound.  Per patient to cancel this referral for her.

## 2015-04-10 NOTE — Addendum Note (Signed)
Addended by: Hulan Fray on: 04/10/2015 07:32 PM   Modules accepted: Orders

## 2015-04-23 ENCOUNTER — Emergency Department (HOSPITAL_COMMUNITY)
Admission: EM | Admit: 2015-04-23 | Discharge: 2015-04-24 | Disposition: A | Discharge status: Home / Self Care | Payer: Medicare Other | Attending: Emergency Medicine | Admitting: Emergency Medicine

## 2015-04-23 ENCOUNTER — Emergency Department (HOSPITAL_COMMUNITY): Payer: Medicare Other

## 2015-04-23 ENCOUNTER — Encounter (HOSPITAL_COMMUNITY): Payer: Self-pay | Admitting: Emergency Medicine

## 2015-04-23 DIAGNOSIS — F329 Major depressive disorder, single episode, unspecified: Secondary | ICD-10-CM | POA: Insufficient documentation

## 2015-04-23 DIAGNOSIS — E11621 Type 2 diabetes mellitus with foot ulcer: Secondary | ICD-10-CM | POA: Diagnosis not present

## 2015-04-23 DIAGNOSIS — Z7984 Long term (current) use of oral hypoglycemic drugs: Secondary | ICD-10-CM | POA: Diagnosis not present

## 2015-04-23 DIAGNOSIS — M79671 Pain in right foot: Secondary | ICD-10-CM | POA: Diagnosis present

## 2015-04-23 DIAGNOSIS — I10 Essential (primary) hypertension: Secondary | ICD-10-CM | POA: Insufficient documentation

## 2015-04-23 DIAGNOSIS — E1140 Type 2 diabetes mellitus with diabetic neuropathy, unspecified: Secondary | ICD-10-CM | POA: Insufficient documentation

## 2015-04-23 DIAGNOSIS — Z7982 Long term (current) use of aspirin: Secondary | ICD-10-CM | POA: Diagnosis not present

## 2015-04-23 DIAGNOSIS — Z87828 Personal history of other (healed) physical injury and trauma: Secondary | ICD-10-CM | POA: Diagnosis not present

## 2015-04-23 DIAGNOSIS — E781 Pure hyperglyceridemia: Secondary | ICD-10-CM | POA: Insufficient documentation

## 2015-04-23 DIAGNOSIS — G629 Polyneuropathy, unspecified: Secondary | ICD-10-CM | POA: Insufficient documentation

## 2015-04-23 DIAGNOSIS — L97519 Non-pressure chronic ulcer of other part of right foot with unspecified severity: Secondary | ICD-10-CM | POA: Diagnosis not present

## 2015-04-23 DIAGNOSIS — Z79899 Other long term (current) drug therapy: Secondary | ICD-10-CM | POA: Diagnosis not present

## 2015-04-23 DIAGNOSIS — G8929 Other chronic pain: Secondary | ICD-10-CM | POA: Diagnosis not present

## 2015-04-23 DIAGNOSIS — L97419 Non-pressure chronic ulcer of right heel and midfoot with unspecified severity: Secondary | ICD-10-CM | POA: Diagnosis not present

## 2015-04-23 DIAGNOSIS — J449 Chronic obstructive pulmonary disease, unspecified: Secondary | ICD-10-CM | POA: Insufficient documentation

## 2015-04-23 DIAGNOSIS — F17210 Nicotine dependence, cigarettes, uncomplicated: Secondary | ICD-10-CM | POA: Insufficient documentation

## 2015-04-23 DIAGNOSIS — N289 Disorder of kidney and ureter, unspecified: Secondary | ICD-10-CM | POA: Diagnosis not present

## 2015-04-23 LAB — CBC WITH DIFFERENTIAL/PLATELET
Basophils Absolute: 0.1 10*3/uL (ref 0.0–0.1)
Basophils Relative: 1 %
Eosinophils Absolute: 0.2 10*3/uL (ref 0.0–0.7)
Eosinophils Relative: 3 %
HCT: 37.5 % (ref 36.0–46.0)
Hemoglobin: 12.2 g/dL (ref 12.0–15.0)
Lymphocytes Relative: 25 %
Lymphs Abs: 2 10*3/uL (ref 0.7–4.0)
MCH: 31.9 pg (ref 26.0–34.0)
MCHC: 32.5 g/dL (ref 30.0–36.0)
MCV: 98.2 fL (ref 78.0–100.0)
Monocytes Absolute: 0.6 10*3/uL (ref 0.1–1.0)
Monocytes Relative: 8 %
Neutro Abs: 5.1 10*3/uL (ref 1.7–7.7)
Neutrophils Relative %: 63 %
Platelets: 175 10*3/uL (ref 150–400)
RBC: 3.82 MIL/uL — ABNORMAL LOW (ref 3.87–5.11)
RDW: 14.4 % (ref 11.5–15.5)
WBC: 8 10*3/uL (ref 4.0–10.5)

## 2015-04-23 LAB — BASIC METABOLIC PANEL
Anion gap: 8 (ref 5–15)
BUN: 18 mg/dL (ref 6–20)
CO2: 23 mmol/L (ref 22–32)
Calcium: 9.3 mg/dL (ref 8.9–10.3)
Chloride: 110 mmol/L (ref 101–111)
Creatinine, Ser: 1.94 mg/dL — ABNORMAL HIGH (ref 0.44–1.00)
GFR calc Af Amer: 31 mL/min — ABNORMAL LOW (ref >60)
GFR calc non Af Amer: 27 mL/min — ABNORMAL LOW (ref >60)
Glucose, Bld: 142 mg/dL — ABNORMAL HIGH (ref 65–99)
Potassium: 4.3 mmol/L (ref 3.5–5.1)
Sodium: 141 mmol/L (ref 135–145)

## 2015-04-23 NOTE — ED Provider Notes (Signed)
CSN: [id number]     Arrival date & time 04/23/15  2233 History   First MD Initiated Contact with Patient 04/23/15 2250     Chief Complaint  Patient presents with  . Foot Ulcer     (Consider location/radiation/quality/duration/timing/severity/associated sxs/prior Treatment) HPI Comments: Patient with a history of DM, HTN, high cholesterol presents with open area of chronic right plantar callous with pain and redness in the joint of the 1st MTP. No injury, fever, extension of redness into the foot. She reports significant diabetic neuropathy of the feet with chronic numbness, but reports pain at the site of ulceration for the past 1-2 days.   The history is provided by the patient. No language interpreter was used.    Past Medical History  Diagnosis Date  . Hypertension   . Diabetes mellitus without complication ([place])     diagnosed around 2010, only ever on metformin  . Chronic pain     neck pain, headache, neuropathy  . Hypertriglyceridemia   . Neuromuscular disorder ([place])   . COPD (chronic obstructive pulmonary disease) ([place])   . Depression   . Puncture wound of foot, right 05/17/2012    Tetanus shot 3 yrs ago at [facility] in [place], per pt report   . Brachial plexus disorders    Past Surgical History  Procedure Laterality Date  . Rotator cuff repair    . Carpal tunnel release    . Abdominal hysterectomy    . Cholecystectomy     Family History  Problem Relation Age of Onset  . Hyperlipidemia Mother   . Heart attack Father 63  . Hypertension Father   . Cancer Paternal Grandfather     Lung cancer  . Cancer Maternal Grandmother   . Heart attack Maternal Grandmother    Social History  Substance Use Topics  . Smoking status: Current Every Day Smoker -- 0.00 packs/day for 41 years    Types: Cigarettes  . Smokeless tobacco: Never Used     Comment: Trying to cut back. 1/2PPD OR LESS  . Alcohol Use: No   OB History    No data available     Review of Systems   Constitutional: Negative for fever.  Gastrointestinal: Negative for nausea and vomiting.  Musculoskeletal:       See HPI.  Skin: Positive for wound.  Neurological: Positive for numbness.      Allergies  Flexeril and Soma  Home Medications   Prior to Admission medications   Medication Sig Start Date End Date Taking? Authorizing Provider  albuterol (PROVENTIL HFA;VENTOLIN HFA) 108 (90 BASE) MCG/ACT inhaler Inhale 1-2 puffs into the lungs every 6 (six) hours as needed for wheezing or shortness of breath. 01/24/14   Rushil Sherrye Payor, MD  amLODipine (NORVASC) 5 MG tablet take 1 tablet by mouth once daily 01/05/15   Riccardo Dubin, MD  aspirin 81 MG tablet Take 81 mg by mouth every morning.     Historical Provider, MD  Blood Glucose Monitoring Suppl (ONE TOUCH ULTRA MINI) W/DEVICE KIT 1 Device by Does not apply route 2 (two) times daily. Sample provided to patient 01/24/14   Aldine Contes, MD  butalbital-acetaminophen-caffeine (FIORICET, ESGIC) 50-325-40 MG tablet Take 1 tablet by mouth at bedtime. 03/19/15   Rushil Sherrye Payor, MD  diclofenac sodium (VOLTAREN) 1 % GEL Apply 2 g topically 4 (four) times daily. 02/25/15   Norman Herrlich, MD  DULoxetine (CYMBALTA) 60 MG capsule Take 1 capsule (60 mg total) by mouth every morning.  01/23/15   Rushil Sherrye Payor, MD  gabapentin (NEURONTIN) 600 MG tablet Take 2 tablets (1,200 mg total) by mouth 3 (three) times daily. 01/23/15   Rushil Sherrye Payor, MD  glipiZIDE (GLUCOTROL XL) 10 MG 24 hr tablet Take 1 tablet (10 mg total) by mouth daily with supper. 07/18/14   Riccardo Dubin, MD  glucose blood (ONE TOUCH ULTRA TEST) test strip Check blood sugar two times a day as instructed 09/15/14   Riccardo Dubin, MD  ibuprofen (ADVIL,MOTRIN) 200 MG tablet Take 800 mg by mouth every 6 (six) hours as needed for moderate pain.    Historical Provider, MD  lisinopril (PRINIVIL,ZESTRIL) 40 MG tablet take 1 tablet by mouth once daily 12/05/14   Riccardo Dubin, MD  metFORMIN (GLUCOPHAGE-XR)  750 MG 24 hr tablet Take 2 tablets (1,500 mg total) by mouth daily with breakfast. 01/29/15   Riccardo Dubin, MD  [facility] DELICA LANCETS FINE MISC Check blood sugar up to 2 times a  Day as instructed 09/15/14   Riccardo Dubin, MD  oxyCODONE-acetaminophen (PERCOCET) 10-325 MG per tablet Take 1 tablet by mouth every 8 (eight) hours as needed for pain. 03/05/15 03/04/16  Burgess Estelle, MD  pantoprazole (PROTONIX) 20 MG tablet Take 1 tablet (20 mg total) by mouth daily. 07/18/14   Riccardo Dubin, MD  rosuvastatin (CRESTOR) 40 MG tablet Take 1 tablet (40 mg total) by mouth daily. 04/08/15   Rushil Sherrye Payor, MD  sitaGLIPtin (JANUVIA) 100 MG tablet Take 1 tablet (100 mg total) by mouth daily. 11/13/14   Lucious Groves, DO   BP 149/82 mmHg  Pulse 106  Temp(Src) 98.1 F (36.7 C) (Oral)  Resp 14  SpO2 100% Physical Exam  Constitutional: She is oriented to person, place, and time. She appears well-developed and well-nourished.  Neck: Normal range of motion.  Pulmonary/Chest: Effort normal.  Musculoskeletal: Normal range of motion.  Right 1st MTP joint swollen, erythematous laterally. There is a thickened callous to plantar surface with central full thickness wound that has no malodor, drainage, or purulence.   Neurological: She is alert and oriented to person, place, and time.  Skin: Skin is warm and dry.    ED Course  Procedures (including critical care time) Labs Review Labs Reviewed  BASIC METABOLIC PANEL - Abnormal; Notable for the following:    Glucose, Bld 142 (*)    Creatinine, Ser 1.94 (*)    GFR calc non Af Amer 27 (*)    GFR calc Af Amer 31 (*)    All other components within normal limits  CBC WITH DIFFERENTIAL/PLATELET - Abnormal; Notable for the following:    RBC 3.82 (*)    All other components within normal limits  LACTIC ACID, PLASMA  LACTIC ACID, PLASMA    Imaging Review Dg Foot Complete Right  04/23/2015  CLINICAL DATA:  Plantar foot ulcer near the first metatarsal phalangeal  joint. EXAM: RIGHT FOOT COMPLETE - 3+ VIEW COMPARISON:  None. FINDINGS: The joint spaces are maintained. Mild degenerative changes. Chronic cortical thickening/ periosteal reaction involving the metatarsals may be due to chronic stress related process or venous stasis disease. No acute fracture or destructive bony changes to suggest osteomyelitis. No obvious findings for septic arthritis. No gas is seen in the soft tissues. IMPRESSION: No acute bony findings or definite plain film findings for septic arthritis or osteomyelitis. Chronic periosteal reaction involving the second through fifth metatarsals. Electronically Signed   By: Marijo Sanes M.D.   On:  04/23/2015 23:37   I have personally reviewed and evaluated these images and lab results as part of my medical decision-making.   EKG Interpretation None      MDM   Final diagnoses:  None    1. Right foot ulceration 2. Diabetic neuropathy, chronic 3. Renal insufficiency  Regarding elevated creatinine; the patient denies previous kidney problems. She reports regular ibuprofen use recently for joint aches and, most recently for dental abscess that treatment ended for last week. Suspect isolated creatinine elevation secondary to NSAID use. Discussed this with the patient. Recommended recheck with PCP in one week.   Ulceration does not appear acutely infected. No evidence osteomyelitis on imaging. No leukocytosis, no anion gap. Discussed with Dr. Audie Pinto who has seen the patient. Will discharge home with close PCP follow up. Discussed with internal medicine resident who will arrange F/U appointment.    Charlann Lange, PA-C 04/24/15 0006  Leonard Schwartz, MD 04/30/15 [phone]

## 2015-04-23 NOTE — ED Notes (Signed)
Pt. reports chronic right foot wound for 3 years with swelling and drainage worsening this past several days , denies fever or chills.

## 2015-04-24 DIAGNOSIS — L97419 Non-pressure chronic ulcer of right heel and midfoot with unspecified severity: Secondary | ICD-10-CM | POA: Diagnosis not present

## 2015-04-24 MED ORDER — HYDROCODONE-ACETAMINOPHEN 5-325 MG PO TABS: 1.0000 | ORAL_TABLET | ORAL | Status: DC | PRN

## 2015-04-24 MED ORDER — CLINDAMYCIN HCL 150 MG PO CAPS: 300.0000 mg | ORAL_CAPSULE | Freq: Three times a day (TID) | ORAL | Status: DC

## 2015-04-24 MED ORDER — CLINDAMYCIN HCL 300 MG PO CAPS
300.0000 mg | ORAL_CAPSULE | Freq: Once | ORAL | Status: AC
  Administered 2015-04-24: 300 mg via ORAL
  Filled 2015-04-24: qty 1
  Filled 2015-04-24: qty 2

## 2015-04-24 NOTE — ED Notes (Signed)
Pt left with all her belongings and was wheeled out of the treatment area.  

## 2015-04-24 NOTE — Discharge Instructions (Signed)
Diabetes and Foot Care Diabetes may cause you to have problems because of poor blood supply (circulation) to your feet and legs. This may cause the skin on your feet to become thinner, break easier, and heal more slowly. Your skin may become dry, and the skin may peel and crack. You may also have nerve damage in your legs and feet causing decreased feeling in them. You may not notice minor injuries to your feet that could lead to infections or more serious problems. Taking care of your feet is one of the most important things you can do for yourself.  HOME CARE INSTRUCTIONS  Wear shoes at all times, even in the house. Do not go barefoot. Bare feet are easily injured.  Check your feet daily for blisters, cuts, and redness. If you cannot see the bottom of your feet, use a mirror or ask someone for help.  Wash your feet with warm water (do not use hot water) and mild soap. Then pat your feet and the areas between your toes until they are completely dry. Do not soak your feet as this can dry your skin.  Apply a moisturizing lotion or petroleum jelly (that does not contain alcohol and is unscented) to the skin on your feet and to dry, brittle toenails. Do not apply lotion between your toes.  Trim your toenails straight across. Do not dig under them or around the cuticle. File the edges of your nails with an emery board or nail file.  Do not cut corns or calluses or try to remove them with medicine.  Wear clean socks or stockings every day. Make sure they are not too tight. Do not wear knee-high stockings since they may decrease blood flow to your legs.  Wear shoes that fit properly and have enough cushioning. To break in new shoes, wear them for just a few hours a day. This prevents you from injuring your feet. Always look in your shoes before you put them on to be sure there are no objects inside.  Do not cross your legs. This may decrease the blood flow to your feet.  If you find a minor scrape,  cut, or break in the skin on your feet, keep it and the skin around it clean and dry. These areas may be cleansed with mild soap and water. Do not cleanse the area with peroxide, alcohol, or iodine.  When you remove an adhesive bandage, be sure not to damage the skin around it.  If you have a wound, look at it several times a day to make sure it is healing.  Do not use heating pads or hot water bottles. They may burn your skin. If you have lost feeling in your feet or legs, you may not know it is happening until it is too late.  Make sure your health care provider performs a complete foot exam at least annually or more often if you have foot problems. Report any cuts, sores, or bruises to your health care provider immediately. SEEK MEDICAL CARE IF:   You have an injury that is not healing.  You have cuts or breaks in the skin.  You have an ingrown nail.  You notice redness on your legs or feet.  You feel burning or tingling in your legs or feet.  You have pain or cramps in your legs and feet.  Your legs or feet are numb.  Your feet always feel cold. SEEK IMMEDIATE MEDICAL CARE IF:   There is increasing redness,   swelling, or pain in or around a wound.  There is a red line that goes up your leg.  Pus is coming from a wound.  You develop a fever or as directed by your health care provider.  You notice a bad smell coming from an ulcer or wound.   This information is not intended to replace advice given to you by your health care provider. Make sure you discuss any questions you have with your health care provider.   Document Released: 05/27/2000 Document Revised: 01/30/2013 Document Reviewed: 11/06/2012 Elsevier Interactive Patient Education 2016 [employer]vier Inc.  

## 2015-04-27 ENCOUNTER — Ambulatory Visit (INDEPENDENT_AMBULATORY_CARE_PROVIDER_SITE_OTHER): Payer: BLUE CROSS/BLUE SHIELD | Admitting: Internal Medicine

## 2015-04-27 ENCOUNTER — Inpatient Hospital Stay (HOSPITAL_COMMUNITY)
Admission: AD | Admit: 2015-04-27 | Discharge: 2015-05-03 | DRG: 617 | Disposition: A | Discharge status: Home Health | Payer: Medicare Other | Source: Ambulatory Visit | Attending: Student in an Organized Health Care Education/Training Program | Admitting: Student in an Organized Health Care Education/Training Program

## 2015-04-27 ENCOUNTER — Encounter: Payer: Self-pay | Admitting: Internal Medicine

## 2015-04-27 ENCOUNTER — Encounter (HOSPITAL_COMMUNITY): Payer: Self-pay | Admitting: *Deleted

## 2015-04-27 ENCOUNTER — Observation Stay (HOSPITAL_COMMUNITY): Payer: Medicare Other

## 2015-04-27 DIAGNOSIS — D649 Anemia, unspecified: Secondary | ICD-10-CM | POA: Diagnosis present

## 2015-04-27 DIAGNOSIS — E11621 Type 2 diabetes mellitus with foot ulcer: Secondary | ICD-10-CM

## 2015-04-27 DIAGNOSIS — L97419 Non-pressure chronic ulcer of right heel and midfoot with unspecified severity: Secondary | ICD-10-CM

## 2015-04-27 DIAGNOSIS — L03115 Cellulitis of right lower limb: Secondary | ICD-10-CM | POA: Diagnosis not present

## 2015-04-27 DIAGNOSIS — Z801 Family history of malignant neoplasm of trachea, bronchus and lung: Secondary | ICD-10-CM

## 2015-04-27 DIAGNOSIS — F339 Major depressive disorder, recurrent, unspecified: Secondary | ICD-10-CM | POA: Diagnosis present

## 2015-04-27 DIAGNOSIS — E1140 Type 2 diabetes mellitus with diabetic neuropathy, unspecified: Secondary | ICD-10-CM | POA: Diagnosis present

## 2015-04-27 DIAGNOSIS — E118 Type 2 diabetes mellitus with unspecified complications: Secondary | ICD-10-CM | POA: Diagnosis present

## 2015-04-27 DIAGNOSIS — F17210 Nicotine dependence, cigarettes, uncomplicated: Secondary | ICD-10-CM | POA: Diagnosis present

## 2015-04-27 DIAGNOSIS — E781 Pure hyperglyceridemia: Secondary | ICD-10-CM | POA: Diagnosis present

## 2015-04-27 DIAGNOSIS — Z7982 Long term (current) use of aspirin: Secondary | ICD-10-CM

## 2015-04-27 DIAGNOSIS — T402X5A Adverse effect of other opioids, initial encounter: Secondary | ICD-10-CM | POA: Diagnosis not present

## 2015-04-27 DIAGNOSIS — G709 Myoneural disorder, unspecified: Secondary | ICD-10-CM | POA: Diagnosis present

## 2015-04-27 DIAGNOSIS — E11628 Type 2 diabetes mellitus with other skin complications: Secondary | ICD-10-CM | POA: Diagnosis present

## 2015-04-27 DIAGNOSIS — L97512 Non-pressure chronic ulcer of other part of right foot with fat layer exposed: Secondary | ICD-10-CM | POA: Diagnosis not present

## 2015-04-27 DIAGNOSIS — R51 Headache: Secondary | ICD-10-CM | POA: Diagnosis present

## 2015-04-27 DIAGNOSIS — L97519 Non-pressure chronic ulcer of other part of right foot with unspecified severity: Secondary | ICD-10-CM | POA: Diagnosis present

## 2015-04-27 DIAGNOSIS — L97922 Non-pressure chronic ulcer of unspecified part of left lower leg with fat layer exposed: Secondary | ICD-10-CM | POA: Diagnosis not present

## 2015-04-27 DIAGNOSIS — Z8249 Family history of ischemic heart disease and other diseases of the circulatory system: Secondary | ICD-10-CM

## 2015-04-27 DIAGNOSIS — Z7984 Long term (current) use of oral hypoglycemic drugs: Secondary | ICD-10-CM

## 2015-04-27 DIAGNOSIS — I10 Essential (primary) hypertension: Secondary | ICD-10-CM | POA: Diagnosis present

## 2015-04-27 DIAGNOSIS — E1165 Type 2 diabetes mellitus with hyperglycemia: Secondary | ICD-10-CM | POA: Diagnosis present

## 2015-04-27 DIAGNOSIS — L97502 Non-pressure chronic ulcer of other part of unspecified foot with fat layer exposed: Secondary | ICD-10-CM

## 2015-04-27 DIAGNOSIS — J449 Chronic obstructive pulmonary disease, unspecified: Secondary | ICD-10-CM | POA: Diagnosis present

## 2015-04-27 DIAGNOSIS — M542 Cervicalgia: Secondary | ICD-10-CM | POA: Diagnosis present

## 2015-04-27 DIAGNOSIS — F32A Depression, unspecified: Secondary | ICD-10-CM | POA: Diagnosis present

## 2015-04-27 DIAGNOSIS — L97509 Non-pressure chronic ulcer of other part of unspecified foot with unspecified severity: Secondary | ICD-10-CM

## 2015-04-27 DIAGNOSIS — F320 Major depressive disorder, single episode, mild: Secondary | ICD-10-CM | POA: Diagnosis present

## 2015-04-27 DIAGNOSIS — Z7951 Long term (current) use of inhaled steroids: Secondary | ICD-10-CM

## 2015-04-27 DIAGNOSIS — G8929 Other chronic pain: Secondary | ICD-10-CM | POA: Diagnosis present

## 2015-04-27 DIAGNOSIS — L02611 Cutaneous abscess of right foot: Secondary | ICD-10-CM | POA: Diagnosis present

## 2015-04-27 DIAGNOSIS — M1990 Unspecified osteoarthritis, unspecified site: Secondary | ICD-10-CM | POA: Diagnosis present

## 2015-04-27 DIAGNOSIS — F17201 Nicotine dependence, unspecified, in remission: Secondary | ICD-10-CM

## 2015-04-27 DIAGNOSIS — K5903 Drug induced constipation: Secondary | ICD-10-CM | POA: Diagnosis not present

## 2015-04-27 DIAGNOSIS — K219 Gastro-esophageal reflux disease without esophagitis: Secondary | ICD-10-CM | POA: Diagnosis present

## 2015-04-27 LAB — CBC WITH DIFFERENTIAL/PLATELET
Basophils Absolute: 0 10*3/uL (ref 0.0–0.1)
Basophils Relative: 1 %
EOS ABS: 0.3 10*3/uL (ref 0.0–0.7)
EOS PCT: 4 %
HCT: 37.9 % (ref 36.0–46.0)
Hemoglobin: 11.9 g/dL — ABNORMAL LOW (ref 12.0–15.0)
LYMPHS ABS: 1.7 10*3/uL (ref 0.7–4.0)
Lymphocytes Relative: 23 %
MCH: 31.4 pg (ref 26.0–34.0)
MCHC: 31.4 g/dL (ref 30.0–36.0)
MCV: 100 fL (ref 78.0–100.0)
MONO ABS: 0.5 10*3/uL (ref 0.1–1.0)
MONOS PCT: 8 %
Neutro Abs: 4.7 10*3/uL (ref 1.7–7.7)
Neutrophils Relative %: 64 %
PLATELETS: 200 10*3/uL (ref 150–400)
RBC: 3.79 MIL/uL — ABNORMAL LOW (ref 3.87–5.11)
RDW: 14.4 % (ref 11.5–15.5)
WBC: 7.2 10*3/uL (ref 4.0–10.5)

## 2015-04-27 LAB — GLUCOSE, CAPILLARY: Glucose-Capillary: 85 mg/dL (ref 65–99)

## 2015-04-27 LAB — BASIC METABOLIC PANEL
Anion gap: 10 (ref 5–15)
BUN: 15 mg/dL (ref 6–20)
CALCIUM: 9.5 mg/dL (ref 8.9–10.3)
CHLORIDE: 109 mmol/L (ref 101–111)
CO2: 25 mmol/L (ref 22–32)
CREATININE: 1.14 mg/dL — AB (ref 0.44–1.00)
GFR calc non Af Amer: 50 mL/min — ABNORMAL LOW (ref >60)
GFR, EST AFRICAN AMERICAN: 58 mL/min — AB (ref >60)
GLUCOSE: 105 mg/dL — AB (ref 65–99)
Potassium: 4.6 mmol/L (ref 3.5–5.1)
Sodium: 144 mmol/L (ref 135–145)

## 2015-04-27 LAB — PREALBUMIN: Prealbumin: 23.4 mg/dL (ref 18–38)

## 2015-04-27 MED ORDER — GABAPENTIN 600 MG PO TABS
1200.0000 mg | ORAL_TABLET | Freq: Three times a day (TID) | ORAL | Status: DC
  Administered 2015-04-27 – 2015-05-03 (×17): 1200 mg via ORAL
  Filled 2015-04-27 (×17): qty 2

## 2015-04-27 MED ORDER — PANTOPRAZOLE SODIUM 20 MG PO TBEC
20.0000 mg | DELAYED_RELEASE_TABLET | Freq: Every day | ORAL | Status: DC
  Administered 2015-04-28 – 2015-05-03 (×6): 20 mg via ORAL
  Filled 2015-04-27 (×6): qty 1

## 2015-04-27 MED ORDER — ROSUVASTATIN CALCIUM 20 MG PO TABS
40.0000 mg | ORAL_TABLET | Freq: Every day | ORAL | Status: DC
  Administered 2015-04-28 – 2015-05-03 (×6): 40 mg via ORAL
  Filled 2015-04-27 (×6): qty 2

## 2015-04-27 MED ORDER — INSULIN ASPART 100 UNIT/ML ~~LOC~~ SOLN: 0.0000 [IU] | Freq: Three times a day (TID) | SUBCUTANEOUS | Status: DC

## 2015-04-27 MED ORDER — DULOXETINE HCL 60 MG PO CPEP
60.0000 mg | ORAL_CAPSULE | Freq: Every day | ORAL | Status: DC
  Administered 2015-04-28 – 2015-05-03 (×6): 60 mg via ORAL
  Filled 2015-04-27 (×6): qty 1

## 2015-04-27 MED ORDER — BUTALBITAL-APAP-CAFFEINE 50-325-40 MG PO TABS
1.0000 | ORAL_TABLET | Freq: Every day | ORAL | Status: DC
  Administered 2015-04-27 – 2015-05-02 (×6): 1 via ORAL
  Filled 2015-04-27 (×6): qty 1

## 2015-04-27 MED ORDER — ALBUTEROL SULFATE (2.5 MG/3ML) 0.083% IN NEBU: 3.0000 mL | INHALATION_SOLUTION | Freq: Four times a day (QID) | RESPIRATORY_TRACT | Status: DC | PRN

## 2015-04-27 MED ORDER — INSULIN ASPART 100 UNIT/ML ~~LOC~~ SOLN: 0.0000 [IU] | Freq: Every day | SUBCUTANEOUS | Status: DC

## 2015-04-27 MED ORDER — HYDROCODONE-ACETAMINOPHEN 5-325 MG PO TABS
1.0000 | ORAL_TABLET | ORAL | Status: DC | PRN
  Administered 2015-04-27 – 2015-05-02 (×17): 2 via ORAL
  Filled 2015-04-27 (×18): qty 2

## 2015-04-27 MED ORDER — ENSURE ENLIVE PO LIQD: 237.0000 mL | Freq: Two times a day (BID) | ORAL | Status: DC

## 2015-04-27 MED ORDER — OXYCODONE HCL 5 MG PO TABS
5.0000 mg | ORAL_TABLET | Freq: Once | ORAL | Status: AC
Start: 2015-04-27 — End: 2015-04-27
  Administered 2015-04-27: 5 mg via ORAL
  Filled 2015-04-27: qty 1

## 2015-04-27 MED ORDER — INSULIN ASPART 100 UNIT/ML ~~LOC~~ SOLN
4.0000 [IU] | Freq: Three times a day (TID) | SUBCUTANEOUS | Status: DC
  Administered 2015-04-28 – 2015-05-03 (×15): 4 [IU] via SUBCUTANEOUS

## 2015-04-27 MED ORDER — ENOXAPARIN SODIUM 40 MG/0.4ML ~~LOC~~ SOLN
40.0000 mg | SUBCUTANEOUS | Status: DC
Start: 2015-04-27 — End: 2015-05-03
  Administered 2015-04-27 – 2015-05-02 (×6): 40 mg via SUBCUTANEOUS
  Filled 2015-04-27 (×6): qty 0.4

## 2015-04-27 MED ORDER — ASPIRIN 81 MG PO CHEW
81.0000 mg | CHEWABLE_TABLET | Freq: Every morning | ORAL | Status: DC
  Administered 2015-04-28 – 2015-05-03 (×6): 81 mg via ORAL
  Filled 2015-04-27 (×7): qty 1

## 2015-04-27 MED ORDER — INSULIN ASPART 100 UNIT/ML ~~LOC~~ SOLN
0.0000 [IU] | Freq: Three times a day (TID) | SUBCUTANEOUS | Status: DC
  Administered 2015-04-28 – 2015-04-29 (×2): 2 [IU] via SUBCUTANEOUS
  Administered 2015-04-29: 3 [IU] via SUBCUTANEOUS
  Administered 2015-04-29: 2 [IU] via SUBCUTANEOUS
  Administered 2015-04-30 – 2015-05-02 (×5): 3 [IU] via SUBCUTANEOUS
  Administered 2015-05-03 (×3): 2 [IU] via SUBCUTANEOUS

## 2015-04-27 NOTE — Progress Notes (Signed)
Report called to Andy,RN on 6E.

## 2015-04-27 NOTE — H&P (Signed)
Date: 04/27/2015               Patient Name:  [name] MRN: [id number]  DOB: June 02, 1951 Age / Sex: 64 y.o., female   PCP: Riccardo Dubin, MD         Medical Service: Internal Medicine Teaching Service         Attending Physician: Dr. Campbell Riches, MD    First Contact: Dr. Jule Ser Pager: [phone]  Second Contact: Dr. Albin Felling Pager: [phone]       After Hours (After 5p/  First Contact Pager: [phone]  weekends / holidays): Second Contact Pager: [phone]   Chief Complaint: right foot ulcer  History of Present Illness: [name] is a 64 y.o. female with past medical history of DM2, HTN, HLD, tobacco abuse, chronic pain, COPD, depression who presents to the hospital today as a direct admit from the [facility] with a diabetic foot ulcer located on the plantar surface of her right foot at the first MTP area.  Patient states that 3 years ago she had a similar type ulcer in the exact spot that spontaneously healed itself.  She has not had any problems with this area of her foot for 3 years.  About 2-3 weeks ago, she noticed a recurrence of an ulcer at this location.  She denies any injury that she is aware of.  She reports wearing flip-flops most of the time.  She reports some clear drainage from the wound and associated redness and swelling.  She has been trying to keep the area clean with topical abx, keeping it bandaged, covered, and clean.  She also reports a burning type pain in her foot that extends to the mid-calf region.  She has not had any fever or chills.  Denies any injury to the area.  Denies nausea, vomiting, chest pain, SOB, abdominal pain, urinary complaints, or change in bowel habits.  Prior to today, patient was seen in the Emergency Department on 11/10 and given a prescription for Clindamycin 318m TID, which patient reports she has been taking as prescribed.  Foot x-ray at that time showed no acute bony findings or definite plain film  findings for septic arthritis or osteomyelitis.  Since this ED visit, patient states her right foot got more swollen and tender to palpation.  Patient does have pre-existing neuropathy for which she takes Gabapentin and her last Hgb A1C was 7.0 (Sept 2016).  CBC today reveals no leukocytosis, BMP with improved creatine from 4 days ago, and CRP/ESR are pending at time of admission.  Meds: Current Facility-Administered Medications  Medication Dose Route Frequency Provider Last Rate Last Dose  . albuterol (PROVENTIL) (2.5 MG/3ML) 0.083% nebulizer solution 3 mL  3 mL Inhalation Q6H PRN CJuliet Rude MD      . [Derrill MemoON 04/28/2015] aspirin chewable tablet 81 mg  81 mg Oral q morning - 10a Carly J Rivet, MD      . butalbital-acetaminophen-caffeine (FIORICET, ESGIC) 50-325-40 MG per tablet 1 tablet  1 tablet Oral QHS CJuliet Rude MD      . [Derrill MemoON 04/28/2015] DULoxetine (CYMBALTA) DR capsule 60 mg  60 mg Oral QAC breakfast Carly J Rivet, MD      . enoxaparin (LOVENOX) injection 40 mg  40 mg Subcutaneous Q24H Carly J Rivet, MD      . gabapentin (NEURONTIN) tablet 1,200 mg  1,200 mg Oral TID CJuliet Rude MD      . HYDROcodone-acetaminophen (  NORCO/VICODIN) 5-325 MG per tablet 1-2 tablet  1-2 tablet Oral Q4H PRN Carly J Rivet, MD      . insulin aspart (novoLOG) injection 0-15 Units  0-15 Units Subcutaneous TID WC Carly J Rivet, MD      . insulin aspart (novoLOG) injection 0-5 Units  0-5 Units Subcutaneous QHS Juliet Rude, MD      . Derrill Memo ON 04/28/2015] insulin aspart (novoLOG) injection 4 Units  4 Units Subcutaneous TID WC Juliet Rude, MD      . Derrill Memo ON 04/28/2015] pantoprazole (PROTONIX) EC tablet 20 mg  20 mg Oral Daily Juliet Rude, MD      . Derrill Memo ON 04/28/2015] rosuvastatin (CRESTOR) tablet 40 mg  40 mg Oral Daily Carly Montey Hora, MD        Allergies: Allergies as of 04/27/2015 - Review Complete 04/27/2015  Allergen Reaction Noted  . Flexeril [cyclobenzaprine] Other (See Comments)  08/12/2012  . Soma [carisoprodol] Itching and Rash 05/13/2012   Past Medical History  Diagnosis Date  . Hypertension   . Diabetes mellitus without complication ([name])     diagnosed around 2010, only ever on metformin  . Chronic pain     neck pain, headache, neuropathy  . Hypertriglyceridemia   . Neuromuscular disorder ([place])   . COPD (chronic obstructive pulmonary disease) ([place])   . Depression   . Puncture wound of foot, right 05/17/2012    Tetanus shot 3 yrs ago at [facility] in [place], per pt report   . Brachial plexus disorders    Past Surgical History  Procedure Laterality Date  . Rotator cuff repair    . Carpal tunnel release    . Abdominal hysterectomy    . Cholecystectomy     Family History  Problem Relation Age of Onset  . Hyperlipidemia Mother   . Heart attack Father 20  . Hypertension Father   . Cancer Paternal Grandfather     Lung cancer  . Cancer Maternal Grandmother   . Heart attack Maternal Grandmother    Social History   Social History  . Marital Status: Widowed    Spouse Name: N/A  . Number of Children: N/A  . Years of Education: 14   Occupational History  . unemployed    Social History Main Topics  . Smoking status: Current Every Day Smoker -- 0.00 packs/day for 41 years    Types: Cigarettes  . Smokeless tobacco: Never Used     Comment: Trying to cut back. 1/2PPD OR LESS  . Alcohol Use: No  . Drug Use: No  . Sexual Activity: Not on file   Other Topics Concern  . Not on file   Social History Narrative   Moved to [facility] from [place], shortly after the death of her husband, to live with her daughter.  She notes significant life stress related to her 76 year-old grand-daughter with bipolar disorder.  She has previously worked as a Occupational psychologist.    Review of Systems: Pertinent items are noted in HPI.  Physical Exam: Blood pressure 96/55, pulse 89, temperature 97.7 F (36.5 C), temperature source Oral, resp. rate 17, height  5' 6"  (1.676 m), weight 195 lb 12.8 oz (88.814 kg), SpO2 99 %. Physical Exam  Constitutional: She is oriented to person, place, and time. She appears well-developed and well-nourished. No distress.  HENT:  Head: Normocephalic and atraumatic.  Eyes: Conjunctivae and EOM are normal.  Neck: Normal range of motion.  Cardiovascular: Normal rate, regular rhythm,  normal heart sounds and intact distal pulses.   Pulmonary/Chest: Effort normal and breath sounds normal.  Abdominal: Soft. Bowel sounds are normal. She exhibits no distension.  Musculoskeletal: Normal range of motion. She exhibits tenderness.  0.5 x 0.5 cm foot ulcer plantar surface of first MTP area.  Skin is very hard and callused, no purulence noted.  Mild erythema and swelling of right foot.  Distal pulses are intact, sensation is decreased but intact.  No tenderness of calf.  Neurological: She is alert and oriented to person, place, and time. No cranial nerve deficit.  Psychiatric: She has a normal mood and affect.    Lab results: Basic Metabolic Panel:  Recent Labs  04/27/15 1655  NA 144  K 4.6  CL 109  CO2 25  GLUCOSE 105*  BUN 15  CREATININE 1.14*  CALCIUM 9.5   Liver Function Tests: No results for input(s): AST, ALT, ALKPHOS, BILITOT, PROT, ALBUMIN in the last 72 hours. No results for input(s): LIPASE, AMYLASE in the last 72 hours. No results for input(s): AMMONIA in the last 72 hours. CBC:  Recent Labs  04/27/15 1655  WBC 7.2  NEUTROABS 4.7  HGB 11.9*  HCT 37.9  MCV 100.0  PLT 200   Cardiac Enzymes: No results for input(s): CKTOTAL, CKMB, CKMBINDEX, TROPONINI in the last 72 hours. BNP: No results for input(s): PROBNP in the last 72 hours. D-Dimer: No results for input(s): DDIMER in the last 72 hours. CBG: No results for input(s): GLUCAP in the last 72 hours. Hemoglobin A1C: No results for input(s): HGBA1C in the last 72 hours. Fasting Lipid Panel: No results for input(s): CHOL, HDL, LDLCALC, TRIG,  CHOLHDL, LDLDIRECT in the last 72 hours. Thyroid Function Tests: No results for input(s): TSH, T4TOTAL, FREET4, T3FREE, THYROIDAB in the last 72 hours. Anemia Panel: No results for input(s): VITAMINB12, FOLATE, FERRITIN, TIBC, IRON, RETICCTPCT in the last 72 hours. Coagulation: No results for input(s): LABPROT, INR in the last 72 hours. Urine Drug Screen: Drugs of Abuse     Component Value Date/Time   LABOPIA NONE DETECTED 03/09/2013 2346   COCAINSCRNUR NONE DETECTED 03/09/2013 2346   LABBENZ NONE DETECTED 03/09/2013 2346   AMPHETMU NONE DETECTED 03/09/2013 2346   THCU NONE DETECTED 03/09/2013 2346   LABBARB NONE DETECTED 03/09/2013 2346    Alcohol Level: No results for input(s): ETH in the last 72 hours. Urinalysis: No results for input(s): COLORURINE, LABSPEC, PHURINE, GLUCOSEU, HGBUR, BILIRUBINUR, KETONESUR, PROTEINUR, UROBILINOGEN, NITRITE, LEUKOCYTESUR in the last 72 hours.  Invalid input(s): APPERANCEUR   Imaging results:  No results found.  Other results: EKG: none  Assessment & Plan by Problem: Active Problems:   Diabetic foot ulcer ([place])  Diabetic Foot Ulcer - Right Foot - Consult to WOC, nutrition, and diabetes coordinator [ ]  MRI Right foot [ ]  ESR, CRP [ ]  Blood cultures x 2, wound culture pending [ ]  ABIs - Afebrile, no leukocytosis -- continue to monitor - Norco 5-325 1-2 tablets PO q4h PRN (this is a home medication for her) - Will hold antibiotics right now and await results of labs and imaging  DM2: Home meds are Glipizide, Metformin, Sitagliptin.  Last A1C 7.0 in Sept 2016 - Sliding scale insulin - moderate with HS coverage plus Novolog 4units TID with meals - Monitor CBGs - Gabapentin 1224m TID  HTN: Home meds are Lisinopril and Amlodipine - Hold home meds with BP 96/55 and resolving AKI due to NSAIDs - Continue to monitor BP  HLD: Home meds are  Rosuvastatin and ASA  - Continue Rosuvastatin and ASA 78m  Depression/Anxiety - Continue  Duloxetine 636mdaily  Chronic Headaches - Continue Fioricet QHS  COPD (no PFTs on file) - Albuterol nebs q6h prn  GERD - Continue Pantoprazole 2070maily  Tobacco Abuse - Patient states quit smoking recently and declines nicotine patch  [facility] - Check HIV  FEN Fluid: none Electrolytes: replete PRN Nutrition: Carb modified  DVT PPx: Lovenox  Code: Full  Dispo: Disposition is deferred at this time, awaiting improvement of current medical problems.   The patient does have a current PCP (Rushil PatSherrye PayorD) and [facility].  The patient does not have transportation limitations that hinder transportation to clinic appointments.  Signed: AndJule SerO 04/27/2015, 7:13 PM

## 2015-04-27 NOTE — Progress Notes (Signed)
Patient ID: [name], female   DOB: 24-Oct-1950, 64 y.o.   MRN: [id number]     Subjective:   Patient ID: [name] female   DOB: [birth year] 64 y.o.   MRN: [id number]  HPI: Ms.[name] is a 64 y.o. woman with longstanding T2DM and other PMH noted below who is here for ER follow up to discuss her foot ulcer.  Please see Problem List/A&P for the status of the patient's chronic medical problems      Past Medical History  Diagnosis Date  . Hypertension   . Diabetes mellitus without complication ([place])     diagnosed around 2010, only ever on metformin  . Chronic pain     neck pain, headache, neuropathy  . Hypertriglyceridemia   . Neuromuscular disorder ([place])   . COPD (chronic obstructive pulmonary disease) ([place])   . Depression   . Puncture wound of foot, right 05/17/2012    Tetanus shot 3 yrs ago at [facility] in [place], per pt report   . Brachial plexus disorders    Current Outpatient Prescriptions  Medication Sig Dispense Refill  . albuterol (PROVENTIL HFA;VENTOLIN HFA) 108 (90 BASE) MCG/ACT inhaler Inhale 1-2 puffs into the lungs every 6 (six) hours as needed for wheezing or shortness of breath. 1 Inhaler 3  . amLODipine (NORVASC) 5 MG tablet take 1 tablet by mouth once daily 30 tablet 6  . aspirin 81 MG tablet Take 81 mg by mouth every morning.     . Blood Glucose Monitoring Suppl (ONE TOUCH ULTRA MINI) W/DEVICE KIT 1 Device by Does not apply route 2 (two) times daily. Sample provided to patient 1 each 0  . butalbital-acetaminophen-caffeine (FIORICET, ESGIC) 50-325-40 MG tablet Take 1 tablet by mouth at bedtime. 30 tablet 0  . clindamycin (CLEOCIN) 150 MG capsule Take 2 capsules (300 mg total) by mouth 3 (three) times daily. 60 capsule 0  . diclofenac sodium (VOLTAREN) 1 % GEL Apply 2 g topically 4 (four) times daily. 1 Tube 3  . DULoxetine (CYMBALTA) 60 MG capsule Take 1 capsule (60 mg total) by mouth every morning. 90 capsule 3  . gabapentin (NEURONTIN) 600 MG  tablet Take 2 tablets (1,200 mg total) by mouth 3 (three) times daily. 180 tablet 6  . glipiZIDE (GLUCOTROL XL) 10 MG 24 hr tablet Take 1 tablet (10 mg total) by mouth daily with supper. 90 tablet 3  . glucose blood (ONE TOUCH ULTRA TEST) test strip Check blood sugar two times a day as instructed 50 each 5  . HYDROcodone-acetaminophen (NORCO/VICODIN) 5-325 MG tablet Take 1-2 tablets by mouth every 4 (four) hours as needed. 12 tablet 0  . ibuprofen (ADVIL,MOTRIN) 200 MG tablet Take 800 mg by mouth every 6 (six) hours as needed for moderate pain.    Marland Kitchen lisinopril (PRINIVIL,ZESTRIL) 40 MG tablet take 1 tablet by mouth once daily 90 tablet 3  . metFORMIN (GLUCOPHAGE-XR) 750 MG 24 hr tablet Take 2 tablets (1,500 mg total) by mouth daily with breakfast. 180 tablet 3  . ONETOUCH DELICA LANCETS FINE MISC Check blood sugar up to 2 times a  Day as instructed 50 each 5  . oxyCODONE-acetaminophen (PERCOCET) 10-325 MG per tablet Take 1 tablet by mouth every 8 (eight) hours as needed for pain. 15 tablet 0  . pantoprazole (PROTONIX) 20 MG tablet Take 1 tablet (20 mg total) by mouth daily. 90 tablet 3  . rosuvastatin (CRESTOR) 40 MG tablet Take 1 tablet (40 mg total) by mouth daily. 90 tablet 3  .  sitaGLIPtin (JANUVIA) 100 MG tablet Take 1 tablet (100 mg total) by mouth daily. 90 tablet 3   No current facility-administered medications for this visit.   Family History  Problem Relation Age of Onset  . Hyperlipidemia Mother   . Heart attack Father 81  . Hypertension Father   . Cancer Paternal Grandfather     Lung cancer  . Cancer Maternal Grandmother   . Heart attack Maternal Grandmother    Social History   Social History  . Marital Status: Widowed    Spouse Name: N/A  . Number of Children: N/A  . Years of Education: 14   Occupational History  . unemployed    Social History Main Topics  . Smoking status: Current Every Day Smoker -- 0.00 packs/day for 41 years    Types: Cigarettes  . Smokeless  tobacco: Never Used     Comment: Trying to cut back. 1/2PPD OR LESS  . Alcohol Use: No  . Drug Use: No  . Sexual Activity: Not Asked   Other Topics Concern  . None   Social History Narrative   Moved to [place] from [place] November 2013, shortly after the death of her husband, to live with her daughter.  She notes significant life stress related to her 15 year-old grand-daughter with bipolar disorder.  She has previously worked as a pharmacy tech.   Review of Systems: Review of Systems  Constitutional: Negative for fever, chills and malaise/fatigue.  Respiratory: Negative for cough, shortness of breath and wheezing.   Cardiovascular: Negative for chest pain, claudication and leg swelling.  Gastrointestinal: Negative for nausea, vomiting, abdominal pain, diarrhea and constipation.  Musculoskeletal:       Foot ulcer with some swelling of leg  Neurological: Negative for weakness.    Objective:  Physical Exam: Filed Vitals:   04/27/15 1621  BP: 130/74  Pulse: 93  Temp: 98.2 F (36.8 C)  TempSrc: Oral  SpO2: 100%   Physical Exam  Constitutional: She appears well-developed and well-nourished.  In wheelchair   HENT:  Head: Normocephalic and atraumatic.  Cardiovascular: Normal rate, regular rhythm, normal heart sounds and intact distal pulses.   No murmur heard. Pulmonary/Chest: Effort normal and breath sounds normal. She has no wheezes.  Abdominal: Soft. Bowel sounds are normal. She exhibits no distension. There is no tenderness.  Skin:  1 cm foot ulcer under right toe with purulent drainage and exposed fat layer  Swelling and warmth of the right foot with intact distal pulses No erythema appreciated Some tenderness to palpation of the right foot  No calf tenderness     Assessment & Plan:   Please see problem based charting for assessment and plan Patient will be admitted  

## 2015-04-27 NOTE — Assessment & Plan Note (Addendum)
Pt says that she has had al ulcer under right toe for > 3 years when she moved here. She believed that she developed it after keeping it wet a long time. The ulcer had healed until 2.5 weeks back, she noticed a hole there. She denies any preceding injury. Then she noticed purulent drainage. She went in the ER on Thursday, and she was given oral clindamycin 300 TID which the patient was still taking . She had an xray done which she did not see any acute abnormalities. She was also found to have an elevated creatinine of 1.71 which she attributed to taking ibuprofen 600 mg tid for > 1 month for her left hip. Since then, she believes that her right foot has gotten more swollen and tender and the wound has continued to drain. She also felt warmth of her right foot and she is not able to bear weight. She denies any pain in the calf. Her pain currently is about a 5/10 throbbing pain . She denies any systemic symptoms and denies fevers at home. She had dressed the wound up with betadine and bandage at home. Pts last A1c was 8.1 and she has had existing neuropathy with the diabetes.   Plan: Admit to med surg for IV antibiotics Obtained stat CBC with diff and BMP, and also ESR and CRP She had very poor hygiene of both of her feet- and I advised her again on proper foot care in light of her existing diabetes. Advised to avoid NSAIDs in light of her elevated creatinine.

## 2015-04-28 ENCOUNTER — Observation Stay (HOSPITAL_BASED_OUTPATIENT_CLINIC_OR_DEPARTMENT_OTHER): Payer: Medicare Other

## 2015-04-28 DIAGNOSIS — E785 Hyperlipidemia, unspecified: Secondary | ICD-10-CM

## 2015-04-28 DIAGNOSIS — E13610 Other specified diabetes mellitus with diabetic neuropathic arthropathy: Secondary | ICD-10-CM | POA: Diagnosis not present

## 2015-04-28 DIAGNOSIS — L97519 Non-pressure chronic ulcer of other part of right foot with unspecified severity: Secondary | ICD-10-CM | POA: Diagnosis not present

## 2015-04-28 DIAGNOSIS — F418 Other specified anxiety disorders: Secondary | ICD-10-CM

## 2015-04-28 DIAGNOSIS — Z7984 Long term (current) use of oral hypoglycemic drugs: Secondary | ICD-10-CM | POA: Diagnosis not present

## 2015-04-28 DIAGNOSIS — E11621 Type 2 diabetes mellitus with foot ulcer: Principal | ICD-10-CM

## 2015-04-28 DIAGNOSIS — I10 Essential (primary) hypertension: Secondary | ICD-10-CM | POA: Diagnosis not present

## 2015-04-28 DIAGNOSIS — J449 Chronic obstructive pulmonary disease, unspecified: Secondary | ICD-10-CM

## 2015-04-28 DIAGNOSIS — L97409 Non-pressure chronic ulcer of unspecified heel and midfoot with unspecified severity: Secondary | ICD-10-CM

## 2015-04-28 DIAGNOSIS — R51 Headache: Secondary | ICD-10-CM

## 2015-04-28 DIAGNOSIS — F17211 Nicotine dependence, cigarettes, in remission: Secondary | ICD-10-CM

## 2015-04-28 DIAGNOSIS — K219 Gastro-esophageal reflux disease without esophagitis: Secondary | ICD-10-CM

## 2015-04-28 LAB — GLUCOSE, CAPILLARY
GLUCOSE-CAPILLARY: 178 mg/dL — AB (ref 65–99)
GLUCOSE-CAPILLARY: 110 mg/dL — AB (ref 65–99)
GLUCOSE-CAPILLARY: 107 mg/dL — AB (ref 65–99)
Glucose-Capillary: 124 mg/dL — ABNORMAL HIGH (ref 65–99)

## 2015-04-28 LAB — BASIC METABOLIC PANEL
ANION GAP: 6 (ref 5–15)
BUN: 13 mg/dL (ref 6–20)
CO2: 24 mmol/L (ref 22–32)
Calcium: 9.2 mg/dL (ref 8.9–10.3)
Chloride: 110 mmol/L (ref 101–111)
Creatinine, Ser: 0.82 mg/dL (ref 0.44–1.00)
GLUCOSE: 105 mg/dL — AB (ref 65–99)
POTASSIUM: 4.2 mmol/L (ref 3.5–5.1)
Sodium: 140 mmol/L (ref 135–145)

## 2015-04-28 LAB — CBC
HEMATOCRIT: 33.7 % — AB (ref 36.0–46.0)
HEMOGLOBIN: 10.7 g/dL — AB (ref 12.0–15.0)
MCH: 31.5 pg (ref 26.0–34.0)
MCHC: 31.8 g/dL (ref 30.0–36.0)
MCV: 99.1 fL (ref 78.0–100.0)
Platelets: 167 10*3/uL (ref 150–400)
RBC: 3.4 MIL/uL — ABNORMAL LOW (ref 3.87–5.11)
RDW: 14.2 % (ref 11.5–15.5)
WBC: 6.2 10*3/uL (ref 4.0–10.5)

## 2015-04-28 LAB — C-REACTIVE PROTEIN: CRP: 12.9 mg/L — ABNORMAL HIGH (ref 0.0–4.9)

## 2015-04-28 LAB — SEDIMENTATION RATE: SED RATE: 38 mm/h (ref 0–40)

## 2015-04-28 LAB — HIV ANTIBODY (ROUTINE TESTING W REFLEX): HIV SCREEN 4TH GENERATION: NONREACTIVE

## 2015-04-28 MED ORDER — GLUCERNA SHAKE PO LIQD
237.0000 mL | Freq: Three times a day (TID) | ORAL | Status: DC
  Administered 2015-04-29 – 2015-04-30 (×3): 237 mL via ORAL

## 2015-04-28 NOTE — Progress Notes (Signed)
Internal Medicine Clinic Attending  I saw and evaluated the patient.  I personally confirmed the key portions of the history and exam documented by Dr. Saraiya and I reviewed pertinent patient test results.  The assessment, diagnosis, and plan were formulated together and I agree with the documentation in the resident's note.  

## 2015-04-28 NOTE — Progress Notes (Signed)
VASCULAR LAB PRELIMINARY  ARTERIAL  ABI completed:    RIGHT    LEFT    PRESSURE WAVEFORM  PRESSURE WAVEFORM  BRACHIAL 154 Triphasic BRACHIAL 123 Triphasic  DP 161 Triphasic DP 163 Monophasic  AT   AT    PT 171 Triphasic PT 176 Triphasic  PER   PER    GREAT TOE 70 NA GREAT TOE 45 NA    RIGHT LEFT  ABI 1.11 1.14  TBI 0.45 0.29   Bilateral ABIs are within normal limits. Bilateral TBIs are abnormal.  04/28/2015 3:25 PM Maudry Mayhew, RVT, RDCS, RDMS

## 2015-04-28 NOTE — Progress Notes (Signed)
Late entry:  Patient admitted to unit from Internal medicine clinic. Patient is A&O x 4 and is complaining of 7/10 pain in her right foot, pain medication administered. Diabetic foot ulcer to the right foot, foam and gauze dressing placed; see flowsheets for full assessment. Orders have been reviewed and implemented, patient has been oriented to the unit and unit staff. Call light within reach, will continue to monitor.   Shelbie Hutching, RN, BSN

## 2015-04-28 NOTE — Consult Note (Addendum)
WOC wound consult note Reason for Consult: Consult requested for right foot wound.  Pt states she had a callous to this site and it developed into an open wound and had drainage this week. Wound type: Full thickness to plantar foot Measurement: Callous area is 1X1cm with wound in the center,  Wound bed: 100% slough to inner wound,  bone palpable with swab. Drainage (amount, consistency, odor) mod amt tan drainage, strong odor Periwound: Dry callous surrounding open wound Dressing procedure/placement/frequency: MRI indicates abscess; this is beyond [place] scope of practice.  Recommend ortho consult, discussed plan of care with primary team and they have consulted that team. Will defer to ortho for topical treatment. Please re-consult if further assistance is needed.  Thank-you,  Julien Girt MSN, [place], [place], [name], [place]

## 2015-04-28 NOTE — Progress Notes (Signed)
Initial Nutrition Assessment  DOCUMENTATION CODES:   Obesity unspecified  INTERVENTION:  Discontinue Ensure.  Once diet advances, provide Glucerna Shake po TID, each supplement provides 220 kcal and 10 grams of protein  NUTRITION DIAGNOSIS:   Increased nutrient needs related to wound healing as evidenced by estimated needs.  GOAL:   Patient will meet greater than or equal to 90% of their needs  MONITOR:   Diet advancement, Weight trends, Labs, I & O's, Skin  REASON FOR ASSESSMENT:   Consult Wound healing  ASSESSMENT:   64 y.o. female with past medical history of DM2, HTN, HLD, tobacco abuse, chronic pain, COPD, depression who presents to the hospital today as a direct admit from the [facility] with a diabetic foot ulcer located on the plantar surface of her right foot at the first MTP area.  Pt reports appetite is just "ok". No percent meal completion recorded, however during time of visit RD observed po intake to be 100% at breakfast time. Pt reports she usually consumes 1 meal a day, which is mainly dinner and reports it usually consists of a protein, vegetable, and carbohydrate/starch. Weight has been stable. Pt with a diabetic ulcer R foot. Pt currently has Ensure ordered. CBG's 110-178 mg/dL. Pt is agreeable to switching from Ensure to Glucerna. RD to modify orders.  Pt with no observed significant fat or muscle mass loss.   Labs and medications reviewed.   Diet Order:  Diet NPO time specified  Skin:  Wound (see comment) (Diabetic ulcer R foot)  Last BM:  11/14  Height:   Ht Readings from Last 1 Encounters:  04/27/15 5\' 6"  (1.676 m)    Weight:   Wt Readings from Last 1 Encounters:  04/27/15 195 lb 12.8 oz (88.814 kg)    Ideal Body Weight:  59 kg  BMI:  Body mass index is 31.62 kg/(m^2).  Estimated Nutritional Needs:   Kcal:  1900-2100  Protein:  100-115 grams  Fluid:  1.9-2.1 L/day  EDUCATION NEEDS:   No education needs  identified at this time  Corrin Parker, MS, RD, LDN Pager # [phone] After hours/ weekend pager # [phone]

## 2015-04-28 NOTE — Progress Notes (Signed)
Inpatient Diabetes Program Recommendations  AACE/ADA: New Consensus Statement on Inpatient Glycemic Control (2015)  Target Ranges:  Prepandial:   less than 140 mg/dL      Peak postprandial:   less than 180 mg/dL (1-2 hours)      Critically ill patients:  140 - 180 mg/dL    Results for TAZIYA, ROBIN (MRN [id number]) as of 04/28/2015 10:05  Ref. Range 04/27/2015 19:51 04/27/2015 22:33 04/28/2015 08:07  Glucose-Capillary Latest Ref Range: 65-99 mg/dL 85 178 (H) 124 (H)   Results for KEALI, ANTONELLIS (MRN [id number]) as of 04/28/2015 10:05  Ref. Range 02/25/2015 15:32  Hemoglobin A1C Unknown 7.0     Admit with: Foot Ulcer  History: DM, HTN, COPD  Home DM Meds: Glipizide 10 mg daily       Metformin 1500 mg daily       Januvia 100 mg QHS  Current Insulin Orders: Novolog Moderate SSI (0-15 units) TID AC + HS      Novolog 4 units tidwc     -Received referral for this patient for Diabetic Foot Ulcer.  -Note Novolog SSI and Novolog Meal Coverage both started this AM.  -AM CBG today: 124 mg/dl.     --Will follow patient during hospitalization--  Wyn Quaker RN, MSN, CDE Diabetes Coordinator Inpatient Glycemic Control Team Team Pager: [phone] (8a-5p)

## 2015-04-28 NOTE — Consult Note (Signed)
Reason for Consult:Right foot diabetic ulcer  Referring Physician: Johnnye Sima, MD  [name] is an 64 y.o. female.  HPI: 64 yo female patient with a week history of a draining and painful plantar ulcer under the great toe MTP joint.  Patient reports no recent trauma but that she has had problems with this area of her foot for "a long time." No fevers or chills but she has bee tired the last few days.  She has been seen in the outpatient setting over the last week and placed on po antibiotics.  She was admitted from the Internal Medicine clinic to the hospital for clinical progression of her symptoms.  She does report improvement in her foot swelling since admission but states that it does still burn.  Past Medical History  Diagnosis Date  . Hypertension   . Diabetes mellitus without complication ([place])     diagnosed around 2010, only ever on metformin  . Chronic pain     neck pain, headache, neuropathy  . Hypertriglyceridemia   . Neuromuscular disorder ([place])   . COPD (chronic obstructive pulmonary disease) ([place])   . Depression   . Puncture wound of foot, right 05/17/2012    Tetanus shot [facility] yrs ago at [facility] in [place], per pt report   . Brachial plexus disorders     Past Surgical History  Procedure Laterality Date  . Rotator cuff repair    . Carpal tunnel release    . Abdominal hysterectomy    . Cholecystectomy      Family History  Problem Relation Age of Onset  . Hyperlipidemia Mother   . Heart attack Father 84  . Hypertension Father   . Cancer Paternal Grandfather     Lung cancer  . Cancer Maternal Grandmother   . Heart attack Maternal Grandmother     Social History:  reports that she has been smoking Cigarettes.  She has been smoking about 0.00 packs per day for the past 41 years. She has never used smokeless tobacco. She reports that she does not drink alcohol or use illicit drugs.  Allergies:  Allergies  Allergen Reactions  . Flexeril [Cyclobenzaprine] Other  (See Comments)    Prolonged QTc to 571, tachycardia  . Soma [Carisoprodol] Itching and Rash    Medications: I have reviewed the patient's current medications.  Results for orders placed or performed during the hospital encounter of 04/27/15 (from the past 48 hour(s))  Glucose, capillary     Status: None   Collection Time: 04/27/15  7:51 PM  Result Value Ref Range   Glucose-Capillary 85 65 - 99 mg/dL  Blood Cultures x 2 sites     Status: None (Preliminary result)   Collection Time: 04/27/15  7:56 PM  Result Value Ref Range   Specimen Description BLOOD RIGHT ANTECUBITAL    Special Requests BOTTLES DRAWN AEROBIC ONLY 6CC    Culture NO GROWTH < 24 HOURS    Report Status PENDING   Blood Cultures x 2 sites     Status: None (Preliminary result)   Collection Time: 04/27/15  8:06 PM  Result Value Ref Range   Specimen Description BLOOD LEFT ANTECUBITAL    Special Requests BOTTLES DRAWN AEROBIC ONLY 6CC    Culture NO GROWTH < 24 HOURS    Report Status PENDING   HIV antibody     Status: None   Collection Time: 04/27/15  8:06 PM  Result Value Ref Range   HIV Screen 4th Generation wRfx Non Reactive Non Reactive  Comment: (NOTE) Performed At: [facility] [place], [place] [id number] Lindon Romp MD DD:[id number]   Prealbumin     Status: None   Collection Time: 04/27/15  8:06 PM  Result Value Ref Range   Prealbumin 23.4 18 - 38 mg/dL  Glucose, capillary     Status: Abnormal   Collection Time: 04/27/15 10:33 PM  Result Value Ref Range   Glucose-Capillary 178 (H) 65 - 99 mg/dL  CBC     Status: Abnormal   Collection Time: 04/28/15  6:15 AM  Result Value Ref Range   WBC 6.2 4.0 - 10.5 K/uL   RBC 3.40 (L) 3.87 - 5.11 MIL/uL   Hemoglobin 10.7 (L) 12.0 - 15.0 g/dL   HCT 33.7 (L) 36.0 - 46.0 %   MCV 99.1 78.0 - 100.0 fL   MCH 31.5 26.0 - 34.0 pg   MCHC 31.8 30.0 - 36.0 g/dL   RDW 14.2 11.5 - 15.5 %   Platelets 167 150 - 400 K/uL  Basic metabolic panel      Status: Abnormal   Collection Time: 04/28/15  6:15 AM  Result Value Ref Range   Sodium 140 135 - 145 mmol/L   Potassium 4.2 3.5 - 5.1 mmol/L   [place] 110 101 - 111 mmol/L   CO2 24 22 - 32 mmol/L   Glucose, Bld 105 (H) 65 - 99 mg/dL   BUN 13 6 - 20 mg/dL   Creatinine, Ser 0.82 0.44 - 1.00 mg/dL   Calcium 9.2 8.9 - 10.3 mg/dL   GFR calc non Af Amer >60 >60 mL/min   GFR calc Af Amer >60 >60 mL/min    Comment: (NOTE) The eGFR has been calculated using the CKD EPI equation. This calculation has not been validated in all clinical situations. eGFR's persistently <60 mL/min signify possible Chronic Kidney Disease.    Anion gap 6 5 - 15  Glucose, capillary     Status: Abnormal   Collection Time: 04/28/15  8:07 AM  Result Value Ref Range   Glucose-Capillary 124 (H) 65 - 99 mg/dL  Glucose, capillary     Status: Abnormal   Collection Time: 04/28/15 11:36 AM  Result Value Ref Range   Glucose-Capillary 110 (H) 65 - 99 mg/dL    Mr Foot Right W Wo Contrast  04/28/2015  CLINICAL DATA:  Ulcer under the right toe for greater than 3 years. No prior injury. Purulent drainage. EXAM: MRI OF THE RIGHT FOREFOOT WITHOUT AND WITH CONTRAST TECHNIQUE: Multiplanar, multisequence MR imaging was performed both before and after administration of intravenous contrast. CONTRAST:  20 mL MultiHance COMPARISON:  None. FINDINGS: There is patient motion degrading image quality limiting evaluation. There is mild marrow edema in the lateral hallux sesamoid with a transverse linear signal likely representing a fractured sesamoid. There is minimal marrow edema in the medial hallux sesamoid. There is a bipartite medial hallux sesamoid. There is no other focal marrow signal abnormality. There is no acute fracture or dislocation. There is a small first MTP joint effusion. There is no bone destruction or periosteal reaction. The alignment is anatomic. There is a ulcer along the plantar aspect of the first MTP joint with severe  surrounding soft tissue edema with enhancement. There is a underlying irregularly marginated 8 x 22 x 11 mm fluid collection most consistent with an abscess. The flexor, extensor peroneal tendons are intact. There is nonspecific soft tissue edema along the dorsal aspect of the midfoot likely reactive. IMPRESSION: 1. Soft tissue  ulcer and cellulitis along the plantar aspect of the first MTP joint with a a 8 x 22 x 11 mm abscess deep to the ulceration. No evidence of osteomyelitis of the right forefoot. 2. Mild marrow edema in the lateral hallux sesamoid with a transverse linear signal likely representing a fractured sesamoid. Electronically Signed   By: Kathreen Devoid   On: 04/28/2015 07:46    ROS Blood pressure 139/56, pulse 74, temperature 98.4 F (36.9 C), temperature source Oral, resp. rate 18, height 5' 6"  (1.676 m), weight 88.814 kg (195 lb 12.8 oz), SpO2 98 %. Physical Exam Healthy appearing female in no apparent distress. Right foot with a swollen and tender MTP joint with limited ROM due to pain. Erythema is present and a 1 cm ulcer with clear foul smelling drainage is present plantar to the MTP joint great toe.  The remainder of the foot is non swollen and non tender with palpable PT and DP pulses.  Left LE with no open lesions and no tenderness no swelling, normal AROM.   Assessment/Plan: Right foot diabetic plantar ulcer with active drainage and abscess by MRI. ABIs normal per vascular tech but the TBIs are abnormal.   I have spoken with Dr Meridee Score, orthopedic foot and ankle specialist,who has agreed to evaluate the patient tomorrow.  Continue current empiric antibiotic coverage.  Continue dry dressings and foot elevation and bedrest.  Ok to eat at this point. No surgery planned today.  Merryn Thaker,STEVEN R 04/28/2015, 3:22 PM

## 2015-04-28 NOTE — Progress Notes (Signed)
Subjective: Patient seen and examined this morning on rounds.  Still having pain in right foot described as burning.  Pain is not worse than yesterday.  No fever or chills, nausea or vomiting.  No other complaints than right foot pain. Objective: Vital signs in last 24 hours: Filed Vitals:   04/27/15 1740 04/27/15 2100 04/28/15 0500 04/28/15 0810  BP: 96/55 118/53 109/64 139/56  Pulse: 89 80 78 74  Temp: 97.7 F (36.5 C) 97.9 F (36.6 C) 98.3 F (36.8 C) 98.4 F (36.9 C)  TempSrc: Oral Oral Oral Oral  Resp: _0 Height: _1  (1.676 m)     Weight: 195 lb 12.8 oz (88.814 kg)     SpO2: 99% 98% 99% 98%   Weight change:   Intake/Output Summary (Last 24 hours) at 04/28/15 1253 Last data filed at 04/28/15 0810  Gross per 24 hour  Intake    360 ml  Output      0 ml  Net    360 ml   General: resting in bed, no acute distress HEENT: EOMI, no scleral icterus Cardiac: RRR, no rubs, murmurs or gallops Pulm: clear to auscultation bilaterally, moving normal volumes of air Abd: soft, nontender, nondistended, BS present Extremities: 0.5 x 0.5 cm foot ulcer plantar surface of first MTP area. Skin is very hard and callused, some expression of serosanguinous drainage. Mild erythema and swelling of right foot. Distal pulses are intact, sensation is intact. No tenderness of calf. Neuro: alert and oriented X3, no focal deficits  Lab Results: Basic Metabolic Panel:  Recent Labs Lab 04/27/15 1655 04/28/15 0615  NA 144 140  K 4.6 4.2  CL 109 110  CO2 25 24  GLUCOSE 105* 105*  BUN 15 13  CREATININE 1.14* 0.82  CALCIUM 9.5 9.2   Liver Function Tests: No results for input(s): AST, ALT, ALKPHOS, BILITOT, PROT, ALBUMIN in the last 168 hours. No results for input(s): LIPASE, AMYLASE in the last 168 hours. No results for input(s): AMMONIA in the last 168 hours. CBC:  Recent Labs Lab 04/23/15 2258 04/27/15 1655 04/28/15 0615  WBC 8.0 7.2 6.2  NEUTROABS 5.1 4.7  --     HGB 12.2 11.9* 10.7*  HCT 37.5 37.9 33.7*  MCV 98.2 100.0 99.1  PLT 175 200 167   Cardiac Enzymes: No results for input(s): CKTOTAL, CKMB, CKMBINDEX, TROPONINI in the last 168 hours. BNP: No results for input(s): PROBNP in the last 168 hours. D-Dimer: No results for input(s): DDIMER in the last 168 hours. CBG:  Recent Labs Lab 04/27/15 1951 04/27/15 2233 04/28/15 0807 04/28/15 1136  GLUCAP 85 178* 124* 110*   Hemoglobin A1C: No results for input(s): HGBA1C in the last 168 hours. Fasting Lipid Panel: No results for input(s): CHOL, HDL, LDLCALC, TRIG, CHOLHDL, LDLDIRECT in the last 168 hours. Thyroid Function Tests: No results for input(s): TSH, T4TOTAL, FREET4, T3FREE, THYROIDAB in the last 168 hours. Coagulation: No results for input(s): LABPROT, INR in the last 168 hours. Anemia Panel: No results for input(s): VITAMINB12, FOLATE, FERRITIN, TIBC, IRON, RETICCTPCT in the last 168 hours. Urine Drug Screen: Drugs of Abuse     Component Value Date/Time   LABOPIA NONE DETECTED 03/09/2013 2346   COCAINSCRNUR NONE DETECTED 03/09/2013 2346   LABBENZ NONE DETECTED 03/09/2013 2346   AMPHETMU NONE DETECTED 03/09/2013 2346   THCU NONE DETECTED 03/09/2013 2346   LABBARB NONE DETECTED 03/09/2013 2346    Alcohol Level: No results for input(s): ETH in the  last 168 hours. Urinalysis: No results for input(s): COLORURINE, LABSPEC, PHURINE, GLUCOSEU, HGBUR, BILIRUBINUR, KETONESUR, PROTEINUR, UROBILINOGEN, NITRITE, LEUKOCYTESUR in the last 168 hours.  Invalid input(s): APPERANCEUR   Micro Results: No results found for this or any previous visit (from the past 240 hour(s)). Studies/Results: Mr Foot Right W Wo Contrast  04/28/2015  CLINICAL DATA:  Ulcer under the right toe for greater than 3 years. No prior injury. Purulent drainage. EXAM: MRI OF THE RIGHT FOREFOOT WITHOUT AND WITH CONTRAST TECHNIQUE: Multiplanar, multisequence MR imaging was performed both before and after  administration of intravenous contrast. CONTRAST:  20 mL MultiHance COMPARISON:  None. FINDINGS: There is patient motion degrading image quality limiting evaluation. There is mild marrow edema in the lateral hallux sesamoid with a transverse linear signal likely representing a fractured sesamoid. There is minimal marrow edema in the medial hallux sesamoid. There is a bipartite medial hallux sesamoid. There is no other focal marrow signal abnormality. There is no acute fracture or dislocation. There is a small first MTP joint effusion. There is no bone destruction or periosteal reaction. The alignment is anatomic. There is a ulcer along the plantar aspect of the first MTP joint with severe surrounding soft tissue edema with enhancement. There is a underlying irregularly marginated 8 x 22 x 11 mm fluid collection most consistent with an abscess. The flexor, extensor peroneal tendons are intact. There is nonspecific soft tissue edema along the dorsal aspect of the midfoot likely reactive. IMPRESSION: 1. Soft tissue ulcer and cellulitis along the plantar aspect of the first MTP joint with a a 8 x 22 x 11 mm abscess deep to the ulceration. No evidence of osteomyelitis of the right forefoot. 2. Mild marrow edema in the lateral hallux sesamoid with a transverse linear signal likely representing a fractured sesamoid. Electronically Signed   By: Kathreen Devoid   On: 04/28/2015 07:46   Medications:  Scheduled Meds: . aspirin  81 mg Oral q morning - 10a  . butalbital-acetaminophen-caffeine  1 tablet Oral QHS  . DULoxetine  60 mg Oral QAC breakfast  . enoxaparin (LOVENOX) injection  40 mg Subcutaneous Q24H  . feeding supplement (ENSURE ENLIVE)  237 mL Oral BID BM  . gabapentin  1,200 mg Oral TID  . insulin aspart  0-15 Units Subcutaneous TID WC  . insulin aspart  0-5 Units Subcutaneous QHS  . insulin aspart  4 Units Subcutaneous TID WC  . pantoprazole  20 mg Oral Daily  . rosuvastatin  40 mg Oral Daily    Continuous Infusions:  PRN Meds:.albuterol, HYDROcodone-acetaminophen Assessment/Plan: Principal Problem:   Diabetic foot ulcer (HCC) Active Problems:   Type 2 diabetes, uncontrolled, with neuropathy ([place])   Hypertension   Diastolic dysfunction   Depression, major, recurrent ([place])  Diabetic Foot Ulcer - Right Foot - Consult to orthopedic surgery given MRI findings of abscess - Consult to [place], nutrition, and diabetes coordinator -- appreciate their recs.   - MRI Right foot:  soft tissue ulcer and cellulitis along the plantar aspect of the first MTP joint with a a 8 x 22 x 11 mm abscess deep to the ulceration. No evidence of osteomyelitis of the right forefoot  - ESR normal, CRP 13 $Remo'[ ]'[id number]$  Blood cultures x 2, wound culture pending $RemoveBeforeD'[ ]'[id number]$  ABIs pending - Afebrile, no leukocytosis today -- continue to monitor - Norco 5-325 1-2 tablets PO q4h PRN (this is a home medication for her) - Continue holding antibiotics right now and await results of labs and imaging  DM2: Home meds are Glipizide, Metformin, Sitagliptin. Last A1C 7.0 in Sept 2016 - Sliding scale insulin - moderate with HS coverage plus Novolog 4units TID with meals - Monitor CBGs -- ranging from 124-178 - Gabapentin 1270m TID  HTN: Home meds are Lisinopril and Amlodipine - Hold home meds with BP 96/55 and resolving AKI due to NSAIDs - Continue to monitor BP  HLD: Home meds are Rosuvastatin and ASA  - Continue Rosuvastatin and ASA 817m Depression/Anxiety - Continue Duloxetine 6015maily  Chronic Headaches - Continue Fioricet QHS  COPD (no PFTs on file) - Albuterol nebs q6h prn  GERD - Continue Pantoprazole 84m72mily  Tobacco Abuse - Patient states quit smoking recently and declines nicotine patch  [facility] - Check HIV  FEN Fluid: none Electrolytes: replete PRN Nutrition: Carb modified  DVT PPx: Lovenox  Code: Full  Dispo: Disposition is deferred at this time, awaiting improvement of current  medical problems.    The patient does have a current PCP (Rushil PateSherrye Payor) and [facility].  The patient does not have transportation limitations that hinder transportation to clinic appointments.   LOS: 1 day   AndrJule Ser 04/28/2015, 12:53 PM

## 2015-04-29 ENCOUNTER — Other Ambulatory Visit (HOSPITAL_COMMUNITY): Payer: Self-pay | Admitting: Orthopedic Surgery

## 2015-04-29 DIAGNOSIS — D649 Anemia, unspecified: Secondary | ICD-10-CM | POA: Diagnosis present

## 2015-04-29 DIAGNOSIS — E119 Type 2 diabetes mellitus without complications: Secondary | ICD-10-CM | POA: Diagnosis not present

## 2015-04-29 DIAGNOSIS — L97519 Non-pressure chronic ulcer of other part of right foot with unspecified severity: Secondary | ICD-10-CM | POA: Diagnosis present

## 2015-04-29 DIAGNOSIS — E1165 Type 2 diabetes mellitus with hyperglycemia: Secondary | ICD-10-CM | POA: Diagnosis present

## 2015-04-29 DIAGNOSIS — T402X5A Adverse effect of other opioids, initial encounter: Secondary | ICD-10-CM | POA: Diagnosis not present

## 2015-04-29 DIAGNOSIS — E1142 Type 2 diabetes mellitus with diabetic polyneuropathy: Secondary | ICD-10-CM | POA: Diagnosis not present

## 2015-04-29 DIAGNOSIS — K5903 Drug induced constipation: Secondary | ICD-10-CM | POA: Diagnosis not present

## 2015-04-29 DIAGNOSIS — Z79899 Other long term (current) drug therapy: Secondary | ICD-10-CM

## 2015-04-29 DIAGNOSIS — G709 Myoneural disorder, unspecified: Secondary | ICD-10-CM | POA: Diagnosis present

## 2015-04-29 DIAGNOSIS — J449 Chronic obstructive pulmonary disease, unspecified: Secondary | ICD-10-CM | POA: Diagnosis not present

## 2015-04-29 DIAGNOSIS — Z7982 Long term (current) use of aspirin: Secondary | ICD-10-CM | POA: Diagnosis not present

## 2015-04-29 DIAGNOSIS — Z801 Family history of malignant neoplasm of trachea, bronchus and lung: Secondary | ICD-10-CM | POA: Diagnosis not present

## 2015-04-29 DIAGNOSIS — F17210 Nicotine dependence, cigarettes, uncomplicated: Secondary | ICD-10-CM | POA: Diagnosis present

## 2015-04-29 DIAGNOSIS — L02611 Cutaneous abscess of right foot: Secondary | ICD-10-CM | POA: Diagnosis present

## 2015-04-29 DIAGNOSIS — L97419 Non-pressure chronic ulcer of right heel and midfoot with unspecified severity: Secondary | ICD-10-CM | POA: Diagnosis not present

## 2015-04-29 DIAGNOSIS — R51 Headache: Secondary | ICD-10-CM | POA: Diagnosis present

## 2015-04-29 DIAGNOSIS — L03031 Cellulitis of right toe: Secondary | ICD-10-CM | POA: Diagnosis not present

## 2015-04-29 DIAGNOSIS — E1140 Type 2 diabetes mellitus with diabetic neuropathy, unspecified: Secondary | ICD-10-CM | POA: Diagnosis present

## 2015-04-29 DIAGNOSIS — Z89411 Acquired absence of right great toe: Secondary | ICD-10-CM | POA: Diagnosis not present

## 2015-04-29 DIAGNOSIS — M86271 Subacute osteomyelitis, right ankle and foot: Secondary | ICD-10-CM | POA: Diagnosis not present

## 2015-04-29 DIAGNOSIS — F339 Major depressive disorder, recurrent, unspecified: Secondary | ICD-10-CM | POA: Diagnosis present

## 2015-04-29 DIAGNOSIS — I10 Essential (primary) hypertension: Secondary | ICD-10-CM | POA: Diagnosis not present

## 2015-04-29 DIAGNOSIS — E11628 Type 2 diabetes mellitus with other skin complications: Secondary | ICD-10-CM | POA: Diagnosis present

## 2015-04-29 DIAGNOSIS — Z7984 Long term (current) use of oral hypoglycemic drugs: Secondary | ICD-10-CM | POA: Diagnosis not present

## 2015-04-29 DIAGNOSIS — E781 Pure hyperglyceridemia: Secondary | ICD-10-CM | POA: Diagnosis present

## 2015-04-29 DIAGNOSIS — M1990 Unspecified osteoarthritis, unspecified site: Secondary | ICD-10-CM | POA: Diagnosis present

## 2015-04-29 DIAGNOSIS — K219 Gastro-esophageal reflux disease without esophagitis: Secondary | ICD-10-CM | POA: Diagnosis present

## 2015-04-29 DIAGNOSIS — E11621 Type 2 diabetes mellitus with foot ulcer: Secondary | ICD-10-CM | POA: Diagnosis not present

## 2015-04-29 DIAGNOSIS — L03115 Cellulitis of right lower limb: Secondary | ICD-10-CM | POA: Diagnosis present

## 2015-04-29 DIAGNOSIS — G8929 Other chronic pain: Secondary | ICD-10-CM | POA: Diagnosis present

## 2015-04-29 DIAGNOSIS — Z8249 Family history of ischemic heart disease and other diseases of the circulatory system: Secondary | ICD-10-CM | POA: Diagnosis not present

## 2015-04-29 DIAGNOSIS — M542 Cervicalgia: Secondary | ICD-10-CM | POA: Diagnosis present

## 2015-04-29 DIAGNOSIS — B9561 Methicillin susceptible Staphylococcus aureus infection as the cause of diseases classified elsewhere: Secondary | ICD-10-CM | POA: Diagnosis not present

## 2015-04-29 LAB — GLUCOSE, CAPILLARY
GLUCOSE-CAPILLARY: 130 mg/dL — AB (ref 65–99)
Glucose-Capillary: 194 mg/dL — ABNORMAL HIGH (ref 65–99)
Glucose-Capillary: 167 mg/dL — ABNORMAL HIGH (ref 65–99)
Glucose-Capillary: 137 mg/dL — ABNORMAL HIGH (ref 65–99)
Glucose-Capillary: 184 mg/dL — ABNORMAL HIGH (ref 65–99)

## 2015-04-29 MED ORDER — SULFAMETHOXAZOLE-TRIMETHOPRIM 800-160 MG PO TABS
1.0000 | ORAL_TABLET | Freq: Two times a day (BID) | ORAL | Status: DC
  Administered 2015-04-29: 1 via ORAL
  Filled 2015-04-29: qty 1

## 2015-04-29 MED ORDER — SULFAMETHOXAZOLE-TRIMETHOPRIM 400-80 MG/5ML IV SOLN
160.0000 mg | Freq: Two times a day (BID) | INTRAVENOUS | Status: DC
  Administered 2015-04-29: 160 mg via INTRAVENOUS
  Filled 2015-04-29 (×4): qty 10

## 2015-04-29 NOTE — Progress Notes (Signed)
Internal Medicine Attending:   I saw and examined the patient. I reviewed the resident's note and I agree with the resident's findings and plan as documented in the resident's note.  64 year old woman admitted with a draining right foot ulcer. MRI foot showed small abscess associated with the ulcer and negative for osteomyelitis. Plan to initiate antibiotics with bactrim for MRSA coverage. Ortho consulted for surgical drainage and debridement. Please obtain surgical cultures to help [place] decide on antibiotic course. Diabetes is otherwise well controlled.

## 2015-04-29 NOTE — Progress Notes (Signed)
Subjective: Patient seen and examined this morning on rounds.  Pain in plantar surface of right foot continues and described as burning.  Was seen yesterday by Dr. Veverly Fells, who recommended patient be seen by Dr. Sharol Given today.  No other complaints besides foot pain.  Objective: Vital signs in last 24 hours: Filed Vitals:   04/28/15 0810 04/28/15 1612 04/28/15 2100 04/29/15 0500  BP: 139/56 111/50 115/47 102/47  Pulse: 74 72 72 67  Temp: 98.4 F (36.9 C) 98 F (36.7 C) 97.5 F (36.4 C) 97.8 F (36.6 C)  TempSrc: Oral Oral Oral Oral  Resp: 18 19 18 18   Height:      Weight:   196 lb 10.4 oz (89.2 kg)   SpO2: 98% 96% 99% 97%   Weight change: 13.6 oz (0.386 kg)  Intake/Output Summary (Last 24 hours) at 04/29/15 1132 Last data filed at 04/29/15 0853  Gross per 24 hour  Intake    480 ml  Output    920 ml  Net   -440 ml   General: resting in bed, no acute distress HEENT: EOMI, no scleral icterus Cardiac: RRR, no rubs, murmurs or gallops Pulm: clear to auscultation bilaterally, moving normal volumes of air Abd: soft, nontender, nondistended, BS present Extremities: foot is wrapped in ACE bandage with area of drainage noted at the first MTP joint.  Sensation is intact and patient able to move her toes. Neuro: alert and oriented X3, no focal deficits  Lab Results: Basic Metabolic Panel:  Recent Labs Lab 04/27/15 1655 04/28/15 0615  NA 144 140  K 4.6 4.2  CL 109 110  CO2 25 24  GLUCOSE 105* 105*  BUN 15 13  CREATININE 1.14* 0.82  CALCIUM 9.5 9.2   Liver Function Tests: No results for input(s): AST, ALT, ALKPHOS, BILITOT, PROT, ALBUMIN in the last 168 hours. No results for input(s): LIPASE, AMYLASE in the last 168 hours. No results for input(s): AMMONIA in the last 168 hours. CBC:  Recent Labs Lab 04/23/15 2258 04/27/15 1655 04/28/15 0615  WBC 8.0 7.2 6.2  NEUTROABS 5.1 4.7  --   HGB 12.2 11.9* 10.7*  HCT 37.5 37.9 33.7*  MCV 98.2 100.0 99.1  PLT 175 200 167    Cardiac Enzymes: No results for input(s): CKTOTAL, CKMB, CKMBINDEX, TROPONINI in the last 168 hours. BNP: No results for input(s): PROBNP in the last 168 hours. D-Dimer: No results for input(s): DDIMER in the last 168 hours. CBG:  Recent Labs Lab 04/27/15 2233 04/28/15 0807 04/28/15 1136 04/28/15 1725 04/28/15 2211 04/29/15 0748  GLUCAP 178* 124* 110* 107* 194* 167*   Hemoglobin A1C: No results for input(s): HGBA1C in the last 168 hours. Fasting Lipid Panel: No results for input(s): CHOL, HDL, LDLCALC, TRIG, CHOLHDL, LDLDIRECT in the last 168 hours. Thyroid Function Tests: No results for input(s): TSH, T4TOTAL, FREET4, T3FREE, THYROIDAB in the last 168 hours. Coagulation: No results for input(s): LABPROT, INR in the last 168 hours. Anemia Panel: No results for input(s): VITAMINB12, FOLATE, FERRITIN, TIBC, IRON, RETICCTPCT in the last 168 hours. Urine Drug Screen: Drugs of Abuse     Component Value Date/Time   LABOPIA NONE DETECTED 03/09/2013 2346   COCAINSCRNUR NONE DETECTED 03/09/2013 2346   LABBENZ NONE DETECTED 03/09/2013 2346   AMPHETMU NONE DETECTED 03/09/2013 2346   THCU NONE DETECTED 03/09/2013 2346   LABBARB NONE DETECTED 03/09/2013 2346    Alcohol Level: No results for input(s): ETH in the last 168 hours. Urinalysis: No results for input(s):  COLORURINE, LABSPEC, [place], GLUCOSEU, HGBUR, BILIRUBINUR, KETONESUR, PROTEINUR, UROBILINOGEN, NITRITE, LEUKOCYTESUR in the last 168 hours.  Invalid input(s): APPERANCEUR   Micro Results: Recent Results (from the past 240 hour(s))  Wound culture     Status: None (Preliminary result)   Collection Time: 04/27/15  7:21 PM  Result Value Ref Range Status   Specimen Description WOUND RIGHT FOOT  Final   Special Requests NONE  Final   Gram Stain PENDING  Incomplete   Culture   Final    Culture reincubated for better growth Performed at [facility]    Report Status PENDING  Incomplete  Blood Cultures x 2  sites     Status: None (Preliminary result)   Collection Time: 04/27/15  7:56 PM  Result Value Ref Range Status   Specimen Description BLOOD RIGHT ANTECUBITAL  Final   Special Requests BOTTLES DRAWN AEROBIC ONLY 6CC  Final   Culture NO GROWTH < 24 HOURS  Final   Report Status PENDING  Incomplete  Blood Cultures x 2 sites     Status: None (Preliminary result)   Collection Time: 04/27/15  8:06 PM  Result Value Ref Range Status   Specimen Description BLOOD LEFT ANTECUBITAL  Final   Special Requests BOTTLES DRAWN AEROBIC ONLY [employer]  Final   Culture NO GROWTH < 24 HOURS  Final   Report Status PENDING  Incomplete   Studies/Results: Mr Foot Right W Wo Contrast  04/28/2015  CLINICAL DATA:  Ulcer under the right toe for greater than 3 years. No prior injury. Purulent drainage. EXAM: MRI OF THE RIGHT FOREFOOT WITHOUT AND WITH CONTRAST TECHNIQUE: Multiplanar, multisequence MR imaging was performed both before and after administration of intravenous contrast. CONTRAST:  20 mL MultiHance COMPARISON:  None. FINDINGS: There is patient motion degrading image quality limiting evaluation. There is mild marrow edema in the lateral hallux sesamoid with a transverse linear signal likely representing a fractured sesamoid. There is minimal marrow edema in the medial hallux sesamoid. There is a bipartite medial hallux sesamoid. There is no other focal marrow signal abnormality. There is no acute fracture or dislocation. There is a small first MTP joint effusion. There is no bone destruction or periosteal reaction. The alignment is anatomic. There is a ulcer along the plantar aspect of the first MTP joint with severe surrounding soft tissue edema with enhancement. There is a underlying irregularly marginated 8 x 22 x 11 mm fluid collection most consistent with an abscess. The flexor, extensor peroneal tendons are intact. There is nonspecific soft tissue edema along the dorsal aspect of the midfoot likely reactive.  IMPRESSION: 1. Soft tissue ulcer and cellulitis along the plantar aspect of the first MTP joint with a a 8 x 22 x 11 mm abscess deep to the ulceration. No evidence of osteomyelitis of the right forefoot. 2. Mild marrow edema in the lateral hallux sesamoid with a transverse linear signal likely representing a fractured sesamoid. Electronically Signed   By: Kathreen Devoid   On: 04/28/2015 07:46   Medications:  Scheduled Meds: . aspirin  81 mg Oral q morning - 10a  . butalbital-acetaminophen-caffeine  1 tablet Oral QHS  . DULoxetine  60 mg Oral QAC breakfast  . enoxaparin (LOVENOX) injection  40 mg Subcutaneous Q24H  . feeding supplement (GLUCERNA SHAKE)  237 mL Oral TID BM  . gabapentin  1,200 mg Oral TID  . insulin aspart  0-15 Units Subcutaneous TID WC  . insulin aspart  0-5 Units Subcutaneous QHS  . insulin  aspart  4 Units Subcutaneous TID WC  . pantoprazole  20 mg Oral Daily  . rosuvastatin  40 mg Oral Daily  . sulfamethoxazole-trimethoprim  1 tablet Oral Q12H   Continuous Infusions:  PRN Meds:.albuterol, HYDROcodone-acetaminophen Assessment/Plan: Principal Problem:   Diabetic foot ulcer (HCC) Active Problems:   Type 2 diabetes, uncontrolled, with neuropathy (HCC)   Hypertension   Diastolic dysfunction   Depression, major, recurrent (HCC)  Diabetic Foot Ulcer - Right Foot - Consult to orthopedic surgery given MRI findings of abscess -- awaiting Dr. Jess Barters evaluation - Start TMP-SMX 800-160 1 tablet q12h - MRI Right foot:  soft tissue ulcer and cellulitis along the plantar aspect of the first MTP joint with a a 8 x 22 x 11 mm abscess deep to the ulceration. No evidence of osteomyelitis of the right forefoot - Blood cultures x NGTD < 24hrs, wound culture pending -  ABIs normal, TBIs abnormal - Norco 5-325 1-2 tablets PO q4h PRN (this is a home medication for her)  DM2: Home meds are Glipizide, Metformin, Sitagliptin. Last A1C 7.0 in Sept 2016 - Sliding scale insulin - moderate  with HS coverage plus Novolog 4units TID with meals - Monitor CBGs -- 167 this AM - Gabapentin 1200mg  TID  HTN: Home meds are Lisinopril and Amlodipine - Stable off home meds - Continue to monitor BP  HLD: Home meds are Rosuvastatin and ASA  - Continue Rosuvastatin and ASA 81mg   Depression/Anxiety - Continue Duloxetine 60mg  daily  Chronic Headaches - Continue Fioricet QHS  COPD (no PFTs on file) - Albuterol nebs q6h prn  GERD - Continue Pantoprazole 20mg  daily  [facility] - HIV non-reactive  FEN Fluid: none Electrolytes: replete PRN Nutrition: Carb modified  DVT PPx: Lovenox  Code: Full  Dispo: Disposition is deferred at this time, awaiting improvement of current medical problems.    The patient does have a current PCP (Rushil Sherrye Payor, MD) and does not need an [facility] hospital follow-up appointment after discharge.  The patient does not have transportation limitations that hinder transportation to clinic appointments.   LOS: 2 days   Jule Ser, DO 04/29/2015, 11:32 AM

## 2015-04-29 NOTE — Progress Notes (Signed)
Pharmacy Consult - Bactrim  Changing from po to IV bactrim for diabetic foot ulcer  Plan: Bactrim 160 mg (trimethoprim component) IV Q 12 hours Continue to follow  Thank you Anette Guarneri, PharmD [phone]

## 2015-04-29 NOTE — Consult Note (Signed)
Reason for Consult: Ulceration right foot great toe MTP joint Referring Physician: Dr. Madelon Lips [name] is an 64 y.o. female.  HPI: Patient is a 64 year old woman who states she's had several year history of ulceration beneath the great toe MTP joint. She states that over the years this ulcer has broken down and closed over.  Past Medical History  Diagnosis Date  . Hypertension   . Diabetes mellitus without complication ([place])     diagnosed around 2010, only ever on metformin  . Chronic pain     neck pain, headache, neuropathy  . Hypertriglyceridemia   . Neuromuscular disorder ([place])   . COPD (chronic obstructive pulmonary disease) ([place])   . Depression   . Puncture wound of foot, right 05/17/2012    Tetanus shot 3 yrs ago at [facility] in [place], per pt report   . Brachial plexus disorders     Past Surgical History  Procedure Laterality Date  . Rotator cuff repair    . Carpal tunnel release    . Abdominal hysterectomy    . Cholecystectomy      Family History  Problem Relation Age of Onset  . Hyperlipidemia Mother   . Heart attack Father 89  . Hypertension Father   . Cancer Paternal Grandfather     Lung cancer  . Cancer Maternal Grandmother   . Heart attack Maternal Grandmother     Social History:  reports that she has been smoking Cigarettes.  She has been smoking about 0.00 packs per day for the past 41 years. She has never used smokeless tobacco. She reports that she does not drink alcohol or use illicit drugs.  Allergies:  Allergies  Allergen Reactions  . Flexeril [Cyclobenzaprine] Other (See Comments)    Prolonged QTc to 571, tachycardia  . Soma [Carisoprodol] Itching and Rash    Medications: I have reviewed the patient's current medications.  Results for orders placed or performed during the hospital encounter of 04/27/15 (from the past 48 hour(s))  Wound culture     Status: None (Preliminary result)   Collection Time: 04/27/15  7:21 PM  Result Value  Ref Range   Specimen Description WOUND RIGHT FOOT    Special Requests NONE    Gram Stain PENDING    Culture      Culture reincubated for better growth Performed at [facility]    Report Status PENDING   Glucose, capillary     Status: None   Collection Time: 04/27/15  7:51 PM  Result Value Ref Range   Glucose-Capillary 85 65 - 99 mg/dL  Blood Cultures x 2 sites     Status: None (Preliminary result)   Collection Time: 04/27/15  7:56 PM  Result Value Ref Range   Specimen Description BLOOD RIGHT ANTECUBITAL    Special Requests BOTTLES DRAWN AEROBIC ONLY 6CC    Culture NO GROWTH < 24 HOURS    Report Status PENDING   Blood Cultures x 2 sites     Status: None (Preliminary result)   Collection Time: 04/27/15  8:06 PM  Result Value Ref Range   Specimen Description BLOOD LEFT ANTECUBITAL    Special Requests BOTTLES DRAWN AEROBIC ONLY 6CC    Culture NO GROWTH < 24 HOURS    Report Status PENDING   HIV antibody     Status: None   Collection Time: 04/27/15  8:06 PM  Result Value Ref Range   HIV Screen 4th Generation wRfx Non Reactive Non Reactive    Comment: (  NOTE) Performed At: [facility] [place], [place] [id number] Lindon Romp MD [id number]   Prealbumin     Status: None   Collection Time: 04/27/15  8:06 PM  Result Value Ref Range   Prealbumin 23.4 18 - 38 mg/dL  Glucose, capillary     Status: Abnormal   Collection Time: 04/27/15 10:33 PM  Result Value Ref Range   Glucose-Capillary 178 (H) 65 - 99 mg/dL  CBC     Status: Abnormal   Collection Time: 04/28/15  6:15 AM  Result Value Ref Range   WBC 6.2 4.0 - 10.5 K/uL   RBC 3.40 (L) 3.87 - 5.11 MIL/uL   Hemoglobin 10.7 (L) 12.0 - 15.0 g/dL   HCT 33.7 (L) 36.0 - 46.0 %   MCV 99.1 78.0 - 100.0 fL   MCH 31.5 26.0 - 34.0 pg   MCHC 31.8 30.0 - 36.0 g/dL   RDW 14.2 11.5 - 15.5 %   Platelets 167 150 - 400 K/uL  Basic metabolic panel     Status: Abnormal   Collection Time: 04/28/15  6:15 AM   Result Value Ref Range   Sodium 140 135 - 145 mmol/L   Potassium 4.2 3.5 - 5.1 mmol/L   Chloride 110 101 - 111 mmol/L   CO2 24 22 - 32 mmol/L   Glucose, Bld 105 (H) 65 - 99 mg/dL   BUN 13 6 - 20 mg/dL   Creatinine, Ser 0.82 0.44 - 1.00 mg/dL   Calcium 9.2 8.9 - 10.3 mg/dL   GFR calc non Af Amer >60 >60 mL/min   GFR calc Af Amer >60 >60 mL/min    Comment: (NOTE) The eGFR has been calculated using the CKD EPI equation. This calculation has not been validated in all clinical situations. eGFR's persistently <60 mL/min signify possible Chronic Kidney Disease.    Anion gap 6 5 - 15  Glucose, capillary     Status: Abnormal   Collection Time: 04/28/15  8:07 AM  Result Value Ref Range   Glucose-Capillary 124 (H) 65 - 99 mg/dL  Glucose, capillary     Status: Abnormal   Collection Time: 04/28/15 11:36 AM  Result Value Ref Range   Glucose-Capillary 110 (H) 65 - 99 mg/dL  Glucose, capillary     Status: Abnormal   Collection Time: 04/28/15  5:25 PM  Result Value Ref Range   Glucose-Capillary 107 (H) 65 - 99 mg/dL  Glucose, capillary     Status: Abnormal   Collection Time: 04/28/15 10:11 PM  Result Value Ref Range   Glucose-Capillary 194 (H) 65 - 99 mg/dL  Glucose, capillary     Status: Abnormal   Collection Time: 04/29/15  7:48 AM  Result Value Ref Range   Glucose-Capillary 167 (H) 65 - 99 mg/dL  Glucose, capillary     Status: Abnormal   Collection Time: 04/29/15 12:03 PM  Result Value Ref Range   Glucose-Capillary 137 (H) 65 - 99 mg/dL    Mr Foot Right W Wo Contrast  04/28/2015  CLINICAL DATA:  Ulcer under the right toe for greater than 3 years. No prior injury. Purulent drainage. EXAM: MRI OF THE RIGHT FOREFOOT WITHOUT AND WITH CONTRAST TECHNIQUE: Multiplanar, multisequence MR imaging was performed both before and after administration of intravenous contrast. CONTRAST:  20 mL MultiHance COMPARISON:  None. FINDINGS: There is patient motion degrading image quality limiting  evaluation. There is mild marrow edema in the lateral hallux sesamoid with a transverse linear signal likely representing  a fractured sesamoid. There is minimal marrow edema in the medial hallux sesamoid. There is a bipartite medial hallux sesamoid. There is no other focal marrow signal abnormality. There is no acute fracture or dislocation. There is a small first MTP joint effusion. There is no bone destruction or periosteal reaction. The alignment is anatomic. There is a ulcer along the plantar aspect of the first MTP joint with severe surrounding soft tissue edema with enhancement. There is a underlying irregularly marginated 8 x 22 x 11 mm fluid collection most consistent with an abscess. The flexor, extensor peroneal tendons are intact. There is nonspecific soft tissue edema along the dorsal aspect of the midfoot likely reactive. IMPRESSION: 1. Soft tissue ulcer and cellulitis along the plantar aspect of the first MTP joint with a a 8 x 22 x 11 mm abscess deep to the ulceration. No evidence of osteomyelitis of the right forefoot. 2. Mild marrow edema in the lateral hallux sesamoid with a transverse linear signal likely representing a fractured sesamoid. Electronically Signed   By: Kathreen Devoid   On: 04/28/2015 07:46    Review of Systems  All other systems reviewed and are negative.  Blood pressure 102/47, pulse 67, temperature 97.8 F (36.6 C), temperature source Oral, resp. rate 18, height 5' 6" (1.676 m), weight 89.2 kg (196 lb 10.4 oz), SpO2 97 %. Physical Exam On examination patient has a faintly palpable dorsalis pedis pulse. She does not have protective sensation. There is a necrotic ulcer beneath the great toe MTP joint. After informed consent and 11 blade knife was used to debride the skin and soft tissue back to the sesamoid bones. There was a large ulcer which is 15 mm in diameter proximally 10 mm deep to probes all the way down to the sesamoid bones. The MRI scan is reviewed which shows  changes consistent with an abscess but this appears to be more necrotic tissue than an abscess. The MRI scan is reviewed and does not show any changes consistent with osteomyelitis. 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  Plan: Discussed with the patient that her best option would be a first ray amputation. With the exposed tendon and bone salvage of the great toe is not possible. We will try to set up her surgery for Friday or Saturday.  [name] 04/29/2015, 12:47 PM

## 2015-04-29 NOTE — Progress Notes (Signed)
Patient complaining of itching above IV site about 30 minutes after starting antibiotic. No rash noted above site and patient not complaining of itching or any other symptoms anywhere else. MD notified and instructed this RN to continue administering IV antibiotic. Educated patient to let this RN know if any other symptoms occur or if itching gets worse. Will continue to monitor.  Shelbie Hutching, RN, BSN

## 2015-04-30 DIAGNOSIS — B9561 Methicillin susceptible Staphylococcus aureus infection as the cause of diseases classified elsewhere: Secondary | ICD-10-CM

## 2015-04-30 LAB — GLUCOSE, CAPILLARY
GLUCOSE-CAPILLARY: 116 mg/dL — AB (ref 65–99)
GLUCOSE-CAPILLARY: 152 mg/dL — AB (ref 65–99)
GLUCOSE-CAPILLARY: 153 mg/dL — AB (ref 65–99)
Glucose-Capillary: 180 mg/dL — ABNORMAL HIGH (ref 65–99)

## 2015-04-30 MED ORDER — VANCOMYCIN HCL IN DEXTROSE 1-5 GM/200ML-% IV SOLN
1000.0000 mg | Freq: Two times a day (BID) | INTRAVENOUS | Status: DC
  Administered 2015-04-30 – 2015-05-02 (×5): 1000 mg via INTRAVENOUS
  Filled 2015-04-30 (×6): qty 200

## 2015-04-30 MED ORDER — HYDROMORPHONE HCL 1 MG/ML IJ SOLN
0.5000 mg | Freq: Once | INTRAMUSCULAR | Status: AC
  Administered 2015-04-30: 0.5 mg via INTRAVENOUS
  Filled 2015-04-30: qty 1

## 2015-04-30 MED ORDER — OXYCODONE HCL 5 MG PO TABS
5.0000 mg | ORAL_TABLET | ORAL | Status: DC | PRN
  Administered 2015-04-30 – 2015-05-03 (×12): 5 mg via ORAL
  Filled 2015-04-30 (×13): qty 1

## 2015-04-30 NOTE — Progress Notes (Signed)
Orthopedics Progress Note  Subjective: Patient still reports right plantar foot pain and understands that she is needing foot surgery per Dr Jess Barters recommendation.  Objective:  Filed Vitals:   04/30/15 0848  BP: 108/51  Pulse: 73  Temp: 97.8 F (36.6 C)  Resp: 20    General: Awake and alert  Musculoskeletal: right foot dressing intact with some drainage and spotting. Able to wiggle toes, sensation intact and toes well perfused Neurovascularly intact  Lab Results  Component Value Date   WBC 6.2 04/28/2015   HGB 10.7* 04/28/2015   HCT 33.7* 04/28/2015   MCV 99.1 04/28/2015   PLT 167 04/28/2015       Component Value Date/Time   NA 140 04/28/2015 0615   NA 139 02/25/2015 1614   K 4.2 04/28/2015 0615   CL 110 04/28/2015 0615   CO2 24 04/28/2015 0615   GLUCOSE 105* 04/28/2015 0615   GLUCOSE 245* 02/25/2015 1614   BUN 13 04/28/2015 0615   BUN 18 02/25/2015 1614   CREATININE 0.82 04/28/2015 0615   CREATININE 0.76 09/22/2014 1036   CALCIUM 9.2 04/28/2015 0615   CALCIUM 10.3 08/20/2012 1402   GFRNONAA >60 04/28/2015 0615   GFRNONAA 85 09/22/2014 1036   GFRAA >60 04/28/2015 0615   GFRAA >89 09/22/2014 1036    Lab Results  Component Value Date   INR 0.95 09/28/2013   INR 0.92 03/09/2013   INR 1.01 05/14/2012    Assessment/Plan: Patient with significant plantar diabetic ulcer under the Great toe MTP joint with deep involvement to the sesamoid bones. Plan : Right Foot 1st Ray Amputation per Dr Meridee Score.  I appreciate Dr Jess Barters expertise and excellent care for Ms Dunkel. Surgery Friday or Saturday  Doran Heater. Veverly Fells, MD 04/30/2015 4:06 PM

## 2015-04-30 NOTE — Care Management Important Message (Signed)
Important Message  Patient Details  Name: [name] MRN: [id number] Date of Birth: [birth year]   Medicare Important Message Given:  Yes    Barb Merino Catcher Dehoyos 04/30/2015, 12:18 PM

## 2015-04-30 NOTE — Progress Notes (Signed)
Subjective: Patient seen and examined this morning.  States she is doing okay regarding having surgery with Dr. Sharol Given later this week.  Only complaint this morning is inadequate pain control.  States her right foot is hurting in the area of her ulcer.  No fever, chills, nausea or vomiting.  States she is getting up to the chair.  Objective: Vital signs in last 24 hours: Filed Vitals:   04/29/15 1652 04/29/15 2028 04/30/15 0518 04/30/15 0848  BP: 114/31 131/55 111/42 108/51  Pulse: 82 69 65 73  Temp: 98.1 F (36.7 C) 98.3 F (36.8 C) 98.1 F (36.7 C) 97.8 F (36.6 C)  TempSrc: Oral   Oral  Resp: 18 20 20 20   Height:      Weight:  195 lb (88.451 kg)    SpO2: 98% 94% 96% 95%   Weight change: -1 lb 10.4 oz (-0.749 kg)  Intake/Output Summary (Last 24 hours) at 04/30/15 1039 Last data filed at 04/30/15 0849  Gross per 24 hour  Intake   1580 ml  Output      0 ml  Net   1580 ml   General: resting in bed, no acute distress HEENT: EOMI, no scleral icterus Cardiac: RRR, no rubs, murmurs or gallops Pulm: clear to auscultation bilaterally, moving normal volumes of air Abd: soft, nontender, nondistended, BS present Extremities: foot is wrapped with serosanguinous drainage noted on plantar R MTP area.  Sensation is intact and patient able to move her toes. Neuro: alert and oriented X3, no focal deficits  Lab Results: Basic Metabolic Panel:  Recent Labs Lab 04/27/15 1655 04/28/15 0615  NA 144 140  K 4.6 4.2  CL 109 110  CO2 25 24  GLUCOSE 105* 105*  BUN 15 13  CREATININE 1.14* 0.82  CALCIUM 9.5 9.2   Liver Function Tests: No results for input(s): AST, ALT, ALKPHOS, BILITOT, PROT, ALBUMIN in the last 168 hours. No results for input(s): LIPASE, AMYLASE in the last 168 hours. No results for input(s): AMMONIA in the last 168 hours. CBC:  Recent Labs Lab 04/23/15 2258 04/27/15 1655 04/28/15 0615  WBC 8.0 7.2 6.2  NEUTROABS 5.1 4.7  --   HGB 12.2 11.9* 10.7*  HCT 37.5  37.9 33.7*  MCV 98.2 100.0 99.1  PLT 175 200 167   Cardiac Enzymes: No results for input(s): CKTOTAL, CKMB, CKMBINDEX, TROPONINI in the last 168 hours. BNP: No results for input(s): PROBNP in the last 168 hours. D-Dimer: No results for input(s): DDIMER in the last 168 hours. CBG:  Recent Labs Lab 04/28/15 2211 04/29/15 0748 04/29/15 1203 04/29/15 1650 04/29/15 2026 04/30/15 0806  GLUCAP 194* 167* 137* 130* 184* 116*   Hemoglobin A1C: No results for input(s): HGBA1C in the last 168 hours. Fasting Lipid Panel: No results for input(s): CHOL, HDL, LDLCALC, TRIG, CHOLHDL, LDLDIRECT in the last 168 hours. Thyroid Function Tests: No results for input(s): TSH, T4TOTAL, FREET4, T3FREE, THYROIDAB in the last 168 hours. Coagulation: No results for input(s): LABPROT, INR in the last 168 hours. Anemia Panel: No results for input(s): VITAMINB12, FOLATE, FERRITIN, TIBC, IRON, RETICCTPCT in the last 168 hours. Urine Drug Screen: Drugs of Abuse     Component Value Date/Time   LABOPIA NONE DETECTED 03/09/2013 2346   COCAINSCRNUR NONE DETECTED 03/09/2013 2346   LABBENZ NONE DETECTED 03/09/2013 2346   AMPHETMU NONE DETECTED 03/09/2013 2346   THCU NONE DETECTED 03/09/2013 2346   LABBARB NONE DETECTED 03/09/2013 2346    Alcohol Level: No results for  input(s): ETH in the last 168 hours. Urinalysis: No results for input(s): COLORURINE, LABSPEC, PHURINE, GLUCOSEU, HGBUR, BILIRUBINUR, KETONESUR, PROTEINUR, UROBILINOGEN, NITRITE, LEUKOCYTESUR in the last 168 hours.  Invalid input(s): APPERANCEUR   Micro Results: Recent Results (from the past 240 hour(s))  Wound culture     Status: None (Preliminary result)   Collection Time: 04/27/15  7:21 PM  Result Value Ref Range Status   Specimen Description WOUND RIGHT FOOT  Final   Special Requests NONE  Final   Gram Stain   Final    NO WBC SEEN NO SQUAMOUS EPITHELIAL CELLS SEEN MODERATE GRAM POSITIVE COCCI IN PAIRS Performed at [facility]    Culture   Final    ABUNDANT STAPHYLOCOCCUS AUREUS Note: RIFAMPIN AND GENTAMICIN SHOULD NOT BE USED AS SINGLE DRUGS FOR TREATMENT OF STAPH INFECTIONS. Performed at [facility]    Report Status PENDING  Incomplete  Blood Cultures x 2 sites     Status: None (Preliminary result)   Collection Time: 04/27/15  7:56 PM  Result Value Ref Range Status   Specimen Description BLOOD RIGHT ANTECUBITAL  Final   Special Requests BOTTLES DRAWN AEROBIC ONLY 6CC  Final   Culture NO GROWTH 2 DAYS  Final   Report Status PENDING  Incomplete  Blood Cultures x 2 sites     Status: None (Preliminary result)   Collection Time: 04/27/15  8:06 PM  Result Value Ref Range Status   Specimen Description BLOOD LEFT ANTECUBITAL  Final   Special Requests BOTTLES DRAWN AEROBIC ONLY 6CC  Final   Culture NO GROWTH 2 DAYS  Final   Report Status PENDING  Incomplete   Studies/Results: No results found. Medications:  Scheduled Meds: . aspirin  81 mg Oral q morning - 10a  . butalbital-acetaminophen-caffeine  1 tablet Oral QHS  . DULoxetine  60 mg Oral QAC breakfast  . enoxaparin (LOVENOX) injection  40 mg Subcutaneous Q24H  . feeding supplement (GLUCERNA SHAKE)  237 mL Oral TID BM  . gabapentin  1,200 mg Oral TID  . insulin aspart  0-15 Units Subcutaneous TID WC  . insulin aspart  0-5 Units Subcutaneous QHS  . insulin aspart  4 Units Subcutaneous TID WC  . pantoprazole  20 mg Oral Daily  . rosuvastatin  40 mg Oral Daily  . vancomycin  1,000 mg Intravenous Q12H   Continuous Infusions:  PRN Meds:.albuterol, HYDROcodone-acetaminophen, oxyCODONE Assessment/Plan: Principal Problem:   Diabetic foot ulcer (HCC) Active Problems:   Type 2 diabetes, uncontrolled, with neuropathy (HCC)   Hypertension   Diastolic dysfunction   Depression, major, recurrent ([place])  Diabetic Foot Ulcer - Right Foot - Consult to orthopedic surgery given MRI findings of abscess, appreciate their assistance.  Dr. Sharol Given  planning for first ray amputation Friday or Saturday. - Vancomycin per pharmacy - MRI Right foot:  soft tissue ulcer and cellulitis along the plantar aspect of the first MTP joint with a a 8 x 22 x 11 mm abscess deep to the ulceration. No evidence of osteomyelitis of the right forefoot - Blood cultures NG x 2 days, wound culture w/ moderate GPC on Gram stain.  Culture with abundant Staph aureus.  Sensitivities and specificities pending. - Norco 5-325 1-2 tablets PO q4h PRN (this is a home medication for her) - Oxycodone IR 5mg  1-2 tablets PO q4h PRN  DM2: Home meds are Glipizide, Metformin, Sitagliptin. Last A1C 7.0 in Sept 2016 - Sliding scale insulin - moderate with HS coverage plus Novolog 4units TID  with meals - Monitor CBGs -- 116 this AM - Gabapentin 1200mg  TID  HTN: Home meds are Lisinopril and Amlodipine - Stable off home meds - Continue to monitor BP  HLD: Home meds are Rosuvastatin and ASA  - Continue Rosuvastatin and ASA 81mg   Depression/Anxiety - Continue Duloxetine 60mg  daily  Chronic Headaches - Continue Fioricet QHS  COPD (no PFTs on file) - Albuterol nebs q6h prn  GERD - Continue Pantoprazole 20mg  daily  FEN Fluid: none Electrolytes: replete PRN Nutrition: Carb modified  DVT PPx: Lovenox  Code: Full  Dispo: Disposition is deferred at this time, awaiting improvement of current medical problems.    The patient does have a current PCP (Rushil Sherrye Payor, MD) and does not need an [facility] hospital follow-up appointment after discharge.  The patient does not have transportation limitations that hinder transportation to clinic appointments.   LOS: 3 days   Jule Ser, DO 04/30/2015, 10:39 AM

## 2015-04-30 NOTE — Progress Notes (Signed)
ANTIBIOTIC CONSULT NOTE - INITIAL  Pharmacy Consult for Vancomycin Indication: Diabetic Foot Infection  Allergies  Allergen Reactions  . Flexeril [Cyclobenzaprine] Other (See Comments)    Prolonged QTc to 571, tachycardia  . Soma [Carisoprodol] Itching and Rash    Patient Measurements: Height: 5\' 6"  (167.6 cm) Weight: 195 lb (88.451 kg) IBW/kg (Calculated) : 59.3 Adjusted Body Weight: 71  Vital Signs: Temp: 97.8 F (36.6 C) (11/17 0848) Temp Source: Oral (11/17 0848) BP: 108/51 mmHg (11/17 0848) Pulse Rate: 73 (11/17 0848) Intake/Output from previous day: 11/16 0701 - 11/17 0700 In: 1580 [P.O.:1320; IV Piggyback:260] Out: 220 [Urine:220] Intake/Output from this shift: Total I/O In: 240 [P.O.:240] Out: -   Labs:  Recent Labs  04/27/15 1655 04/28/15 0615  WBC 7.2 6.2  HGB 11.9* 10.7*  PLT 200 167  CREATININE 1.14* 0.82   Estimated Creatinine Clearance: 79.7 mL/min (by C-G formula based on Cr of 0.82). No results for input(s): VANCOTROUGH, VANCOPEAK, VANCORANDOM, GENTTROUGH, GENTPEAK, GENTRANDOM, TOBRATROUGH, TOBRAPEAK, TOBRARND, AMIKACINPEAK, AMIKACINTROU, AMIKACIN in the last 72 hours.   Microbiology: Recent Results (from the past 720 hour(s))  Wound culture     Status: None (Preliminary result)   Collection Time: 04/27/15  7:21 PM  Result Value Ref Range Status   Specimen Description WOUND RIGHT FOOT  Final   Special Requests NONE  Final   Gram Stain   Final    NO WBC SEEN NO SQUAMOUS EPITHELIAL CELLS SEEN MODERATE GRAM POSITIVE COCCI IN PAIRS Performed at [facility]    Culture   Final    Culture reincubated for better growth Performed at [facility]    Report Status PENDING  Incomplete  Blood Cultures x 2 sites     Status: None (Preliminary result)   Collection Time: 04/27/15  7:56 PM  Result Value Ref Range Status   Specimen Description BLOOD RIGHT ANTECUBITAL  Final   Special Requests BOTTLES DRAWN AEROBIC ONLY 6CC  Final   Culture NO GROWTH 2 DAYS  Final   Report Status PENDING  Incomplete  Blood Cultures x 2 sites     Status: None (Preliminary result)   Collection Time: 04/27/15  8:06 PM  Result Value Ref Range Status   Specimen Description BLOOD LEFT ANTECUBITAL  Final   Special Requests BOTTLES DRAWN AEROBIC ONLY [place]  Final   Culture NO GROWTH 2 DAYS  Final   Report Status PENDING  Incomplete    Medical History: Past Medical History  Diagnosis Date  . Hypertension   . Diabetes mellitus without complication ([place])     diagnosed around 2010, only ever on metformin  . Chronic pain     neck pain, headache, neuropathy  . Hypertriglyceridemia   . Neuromuscular disorder ([place])   . COPD (chronic obstructive pulmonary disease) ([place])   . Depression   . Puncture wound of foot, right 05/17/2012    Tetanus shot 3 yrs ago at [facility] in [place], per pt report   . Brachial plexus disorders     Assessment: Patient is a 64 yo female presenting with diabetic foot infection.  She has a history of diabetes and previous right foot ulcer.  New wound with clear drainage appeared 3 weeks ago.  Presented to ED on 11/10 and given Clindamycin.  No improvement and returned on 11/14 with worsened swelling and tenderness.  No evidence of osteomyelitis or bacteremia.  Patient awaiting amputation on Friday or Saturday.  Cultures 11/14 Blood: ngtd x 2 days 11/14 Wound: GPC in  Pairs  Antibiotics 11/16 >> Bactrim PO >> IV >> 11/16 11/17 >> Vancomycin  Goal of Therapy:  Vancomycin trough level 10-15 mcg/ml  Plan:  --Initiate Vancomycin 1000 mg q12h --Follow creatinine, cultures, and clinical course --Expect short-term course until amputation   Viann Fish 04/30/2015,9:51 AM

## 2015-04-30 NOTE — Progress Notes (Signed)
Internal Medicine Attending:   I saw and examined the patient. I reviewed the resident's note and I agree with the resident's findings and plan as documented in the resident's note.  64 year old woman with well controlled diabetes admitted with a foot ulcer and associated abscess that will require surgical first ray amputation. She is having more pain this morning, more drainage noted on the dressing. Wound culture growing abundant staph aureus. Plan to broaden antibiotic from bactrim to vancomycin to cover resistant MRSA for now until susceptibilities return.

## 2015-05-01 ENCOUNTER — Inpatient Hospital Stay (HOSPITAL_COMMUNITY): Payer: Medicare Other | Admitting: Anesthesiology

## 2015-05-01 ENCOUNTER — Encounter: Payer: Medicare Other | Admitting: Internal Medicine

## 2015-05-01 ENCOUNTER — Encounter (HOSPITAL_COMMUNITY)
Admission: AD | Disposition: A | Discharge status: Home Health | Payer: Self-pay | Source: Ambulatory Visit | Attending: Student in an Organized Health Care Education/Training Program

## 2015-05-01 HISTORY — PX: AMPUTATION: SHX166

## 2015-05-01 LAB — CBC WITH DIFFERENTIAL/PLATELET
Basophils Absolute: 0.1 10*3/uL (ref 0.0–0.1)
Basophils Relative: 1 %
EOS ABS: 0.2 10*3/uL (ref 0.0–0.7)
Eosinophils Relative: 5 %
HCT: 34.6 % — ABNORMAL LOW (ref 36.0–46.0)
HEMOGLOBIN: 10.7 g/dL — AB (ref 12.0–15.0)
LYMPHS ABS: 1.3 10*3/uL (ref 0.7–4.0)
LYMPHS PCT: 27 %
MCH: 31 pg (ref 26.0–34.0)
MCHC: 30.9 g/dL (ref 30.0–36.0)
MCV: 100.3 fL — AB (ref 78.0–100.0)
MONOS PCT: 8 %
Monocytes Absolute: 0.4 10*3/uL (ref 0.1–1.0)
NEUTROS PCT: 59 %
Neutro Abs: 2.8 10*3/uL (ref 1.7–7.7)
Platelets: 164 10*3/uL (ref 150–400)
RBC: 3.45 MIL/uL — AB (ref 3.87–5.11)
RDW: 14.1 % (ref 11.5–15.5)
WBC: 4.7 10*3/uL (ref 4.0–10.5)

## 2015-05-01 LAB — WOUND CULTURE: GRAM STAIN: NONE SEEN

## 2015-05-01 LAB — GLUCOSE, CAPILLARY
GLUCOSE-CAPILLARY: 117 mg/dL — AB (ref 65–99)
GLUCOSE-CAPILLARY: 196 mg/dL — AB (ref 65–99)
GLUCOSE-CAPILLARY: 75 mg/dL (ref 65–99)
GLUCOSE-CAPILLARY: 195 mg/dL — AB (ref 65–99)

## 2015-05-01 LAB — CREATININE, SERUM
Creatinine, Ser: 0.93 mg/dL (ref 0.44–1.00)
GFR calc Af Amer: >60 mL/min (ref >60)
GFR calc non Af Amer: >60 mL/min (ref >60)

## 2015-05-01 LAB — SURGICAL PCR SCREEN
MRSA, PCR: NEGATIVE
STAPHYLOCOCCUS AUREUS: POSITIVE — AB

## 2015-05-01 SURGERY — AMPUTATION, FOOT, RAY
Anesthesia: General | Laterality: Right

## 2015-05-01 MED ORDER — ONDANSETRON HCL 4 MG/2ML IJ SOLN
INTRAMUSCULAR | Status: DC | PRN
  Administered 2015-05-01: 4 mg via INTRAVENOUS

## 2015-05-01 MED ORDER — PROMETHAZINE HCL 25 MG/ML IJ SOLN: 6.2500 mg | INTRAMUSCULAR | Status: DC | PRN

## 2015-05-01 MED ORDER — MIDAZOLAM HCL 2 MG/2ML IJ SOLN
INTRAMUSCULAR | Status: AC
  Filled 2015-05-01: qty 2

## 2015-05-01 MED ORDER — LIDOCAINE HCL (CARDIAC) 20 MG/ML IV SOLN
INTRAVENOUS | Status: DC | PRN
  Administered 2015-05-01: 40 mg via INTRAVENOUS

## 2015-05-01 MED ORDER — PROPOFOL 10 MG/ML IV BOLUS
INTRAVENOUS | Status: AC
  Filled 2015-05-01: qty 20

## 2015-05-01 MED ORDER — METHOCARBAMOL 500 MG PO TABS: 500.0000 mg | ORAL_TABLET | Freq: Four times a day (QID) | ORAL | Status: DC | PRN

## 2015-05-01 MED ORDER — METOCLOPRAMIDE HCL 5 MG PO TABS: 5.0000 mg | ORAL_TABLET | Freq: Three times a day (TID) | ORAL | Status: DC | PRN

## 2015-05-01 MED ORDER — EPHEDRINE SULFATE 50 MG/ML IJ SOLN
INTRAMUSCULAR | Status: DC | PRN
  Administered 2015-05-01: 10 mg via INTRAVENOUS

## 2015-05-01 MED ORDER — LACTATED RINGERS IV SOLN
INTRAVENOUS | Status: DC | PRN
  Administered 2015-05-01: 17:00:00 via INTRAVENOUS

## 2015-05-01 MED ORDER — SENNOSIDES-DOCUSATE SODIUM 8.6-50 MG PO TABS
1.0000 | ORAL_TABLET | Freq: Every day | ORAL | Status: DC
  Administered 2015-05-01 – 2015-05-02 (×2): 1 via ORAL
  Filled 2015-05-01 (×2): qty 1

## 2015-05-01 MED ORDER — ACETAMINOPHEN 325 MG PO TABS: 650.0000 mg | ORAL_TABLET | Freq: Four times a day (QID) | ORAL | Status: DC | PRN

## 2015-05-01 MED ORDER — CHLORHEXIDINE GLUCONATE CLOTH 2 % EX PADS
6.0000 | MEDICATED_PAD | Freq: Every day | CUTANEOUS | Status: DC
  Administered 2015-05-01: 6 via TOPICAL

## 2015-05-01 MED ORDER — HYDROMORPHONE HCL 1 MG/ML IJ SOLN
0.2500 mg | INTRAMUSCULAR | Status: DC | PRN
  Administered 2015-05-01 – 2015-05-02 (×2): 0.5 mg via INTRAVENOUS
  Filled 2015-05-01 (×2): qty 1

## 2015-05-01 MED ORDER — LACTATED RINGERS IV SOLN
INTRAVENOUS | Status: DC
  Administered 2015-05-01: 16:00:00 via INTRAVENOUS

## 2015-05-01 MED ORDER — ONDANSETRON HCL 4 MG PO TABS: 4.0000 mg | ORAL_TABLET | Freq: Four times a day (QID) | ORAL | Status: DC | PRN

## 2015-05-01 MED ORDER — FENTANYL CITRATE (PF) 100 MCG/2ML IJ SOLN
INTRAMUSCULAR | Status: DC | PRN
  Administered 2015-05-01: 50 ug via INTRAVENOUS

## 2015-05-01 MED ORDER — FENTANYL CITRATE (PF) 100 MCG/2ML IJ SOLN
INTRAMUSCULAR | Status: AC
  Filled 2015-05-01: qty 2

## 2015-05-01 MED ORDER — MIDAZOLAM HCL 5 MG/5ML IJ SOLN
INTRAMUSCULAR | Status: DC | PRN
  Administered 2015-05-01: 2 mg via INTRAVENOUS

## 2015-05-01 MED ORDER — 0.9 % SODIUM CHLORIDE (POUR BTL) OPTIME
TOPICAL | Status: DC | PRN
  Administered 2015-05-01: 1000 mL

## 2015-05-01 MED ORDER — PROPOFOL 10 MG/ML IV BOLUS
INTRAVENOUS | Status: DC | PRN
  Administered 2015-05-01: 150 mg via INTRAVENOUS

## 2015-05-01 MED ORDER — MUPIROCIN 2 % EX OINT
TOPICAL_OINTMENT | CUTANEOUS | Status: AC
  Filled 2015-05-01: qty 22

## 2015-05-01 MED ORDER — GADOBENATE DIMEGLUMINE 529 MG/ML IV SOLN
20.0000 mL | Freq: Once | INTRAVENOUS | Status: AC | PRN
  Administered 2015-04-27: 20 mL via INTRAVENOUS

## 2015-05-01 MED ORDER — FENTANYL CITRATE (PF) 250 MCG/5ML IJ SOLN
INTRAMUSCULAR | Status: AC
  Filled 2015-05-01: qty 5

## 2015-05-01 MED ORDER — ACETAMINOPHEN 650 MG RE SUPP: 650.0000 mg | Freq: Four times a day (QID) | RECTAL | Status: DC | PRN

## 2015-05-01 MED ORDER — HYDROCODONE-ACETAMINOPHEN 7.5-325 MG PO TABS: 1.0000 | ORAL_TABLET | Freq: Once | ORAL | Status: DC | PRN

## 2015-05-01 MED ORDER — METHOCARBAMOL 1000 MG/10ML IJ SOLN
500.0000 mg | Freq: Four times a day (QID) | INTRAVENOUS | Status: DC | PRN
  Filled 2015-05-01: qty 5

## 2015-05-01 MED ORDER — ONDANSETRON HCL 4 MG/2ML IJ SOLN: 4.0000 mg | Freq: Four times a day (QID) | INTRAMUSCULAR | Status: DC | PRN

## 2015-05-01 MED ORDER — METOCLOPRAMIDE HCL 5 MG/ML IJ SOLN: 5.0000 mg | Freq: Three times a day (TID) | INTRAMUSCULAR | Status: DC | PRN

## 2015-05-01 MED ORDER — MUPIROCIN 2 % EX OINT
1.0000 | TOPICAL_OINTMENT | Freq: Two times a day (BID) | CUTANEOUS | Status: DC
Start: 2015-05-01 — End: 2015-05-03
  Administered 2015-05-01 – 2015-05-03 (×5): 1 via NASAL
  Filled 2015-05-01: qty 22

## 2015-05-01 MED ORDER — SODIUM CHLORIDE 0.9 % IV SOLN: INTRAVENOUS | Status: DC

## 2015-05-01 MED ORDER — PHENYLEPHRINE 40 MCG/ML (10ML) SYRINGE FOR IV PUSH (FOR BLOOD PRESSURE SUPPORT)
PREFILLED_SYRINGE | INTRAVENOUS | Status: AC
  Filled 2015-05-01: qty 10

## 2015-05-01 SURGICAL SUPPLY — 33 items
BANDAGE ELASTIC 4 VELCRO ST LF (GAUZE/BANDAGES/DRESSINGS) ×2 IMPLANT
BLADE SAW SGTL MED 73X18.5 STR (BLADE) IMPLANT
BLADE SURG 21 STRL SS (BLADE) ×2 IMPLANT
BNDG COHESIVE 4X5 TAN STRL (GAUZE/BANDAGES/DRESSINGS) ×2 IMPLANT
BNDG GAUZE ELAST 4 BULKY (GAUZE/BANDAGES/DRESSINGS) ×2 IMPLANT
COVER SURGICAL LIGHT HANDLE (MISCELLANEOUS) ×4 IMPLANT
DRAPE U-SHAPE 47X51 STRL (DRAPES) ×4 IMPLANT
DRSG ADAPTIC 3X8 NADH LF (GAUZE/BANDAGES/DRESSINGS) ×2 IMPLANT
DRSG PAD ABDOMINAL 8X10 ST (GAUZE/BANDAGES/DRESSINGS) ×2 IMPLANT
DURAPREP 26ML APPLICATOR (WOUND CARE) ×2 IMPLANT
ELECT REM PT RETURN 9FT ADLT (ELECTROSURGICAL) ×2
ELECTRODE REM PT RTRN 9FT ADLT (ELECTROSURGICAL) ×1 IMPLANT
GAUZE SPONGE 4X4 12PLY STRL (GAUZE/BANDAGES/DRESSINGS) ×2 IMPLANT
GLOVE BIOGEL PI IND STRL 9 (GLOVE) ×1 IMPLANT
GLOVE BIOGEL PI INDICATOR 9 (GLOVE) ×1
GLOVE SURG ORTHO 9.0 STRL STRW (GLOVE) ×2 IMPLANT
GOWN STRL REUS W/ TWL LRG LVL3 (GOWN DISPOSABLE) ×1 IMPLANT
GOWN STRL REUS W/ TWL XL LVL3 (GOWN DISPOSABLE) ×2 IMPLANT
GOWN STRL REUS W/TWL LRG LVL3 (GOWN DISPOSABLE) ×1
GOWN STRL REUS W/TWL XL LVL3 (GOWN DISPOSABLE) ×2
KIT BASIN OR (CUSTOM PROCEDURE TRAY) ×2 IMPLANT
KIT ROOM TURNOVER OR (KITS) ×2 IMPLANT
NS IRRIG 1000ML POUR BTL (IV SOLUTION) ×2 IMPLANT
PACK ORTHO EXTREMITY (CUSTOM PROCEDURE TRAY) ×2 IMPLANT
PAD ARMBOARD 7.5X6 YLW CONV (MISCELLANEOUS) ×4 IMPLANT
SPONGE GAUZE 4X4 12PLY STER LF (GAUZE/BANDAGES/DRESSINGS) ×2 IMPLANT
SPONGE LAP 18X18 X RAY DECT (DISPOSABLE) ×2 IMPLANT
STOCKINETTE IMPERVIOUS LG (DRAPES) IMPLANT
SUT ETHILON 2 0 PSLX (SUTURE) ×4 IMPLANT
TOWEL OR 17X24 6PK STRL BLUE (TOWEL DISPOSABLE) ×2 IMPLANT
TOWEL OR 17X26 10 PK STRL BLUE (TOWEL DISPOSABLE) ×2 IMPLANT
UNDERPAD 30X30 INCONTINENT (UNDERPADS AND DIAPERS) ×2 IMPLANT
WATER STERILE IRR 1000ML POUR (IV SOLUTION) ×2 IMPLANT

## 2015-05-01 NOTE — Op Note (Signed)
04/27/2015 - 05/01/2015  5:31 PM  PATIENT:  [name]    PRE-OPERATIVE DIAGNOSIS:  Abscess Right Foot  POST-OPERATIVE DIAGNOSIS:  Same  PROCEDURE:  Right Foot 1st Ray Amputation Local tissue rearrangement for wound closure 3 x 8 cm  SURGEON:  Lanay Zinda V, MD  PHYSICIAN ASSISTANT:None ANESTHESIA:   General  PREOPERATIVE INDICATIONS:  [name] is a  64 y.o. female with a diagnosis of Abscess Right Foot who failed conservative measures and elected for surgical management.    The risks benefits and alternatives were discussed with the patient preoperatively including but not limited to the risks of infection, bleeding, nerve injury, cardiopulmonary complications, the need for revision surgery, among others, and the patient was willing to proceed.  OPERATIVE IMPLANTS: None  OPERATIVE FINDINGS: Good petechial bleeding  OPERATIVE PROCEDURE: Patient was brought to the operating room and underwent a general anesthetic. After adequate levels anesthesia obtained patient's right lower extremity was prepped using DuraPrep draped into a sterile field. A timeout was called. Elliptical incision was made around the first ray to resect the first ray and plantar ulcer in 1 block of tissue. Electrocautery was used for hemostasis. The wound was irrigated with normal saline. Local tissue rearrangement was used to close a wound 3 x 8 cm. A sterile compressive dressing was applied. Patient was extubated taken to the PACU in stable condition.

## 2015-05-01 NOTE — Progress Notes (Signed)
Subjective: Patient seen and examined this morning.  She is on the surgery schedule for this afternoon with Dr Sharol Given.  States her pain today is more controlled than yesterday.  No other complaints.  No fever, chills, nausea or vomiting.  She is getting up and walking the halls 3 times yesterday per her report.  Objective: Vital signs in last 24 hours: Filed Vitals:   04/30/15 1845 04/30/15 2100 05/01/15 0500 05/01/15 1024  BP: 132/52 128/64 131/56 124/104  Pulse: 67 79 60 72  Temp: 98.6 F (37 C) 98 F (36.7 C) 98.1 F (36.7 C) 98.2 F (36.8 C)  TempSrc: Oral Oral Oral Oral  Resp: 20 18 18 20   Height:      Weight:  198 lb 9.6 oz (90.084 kg)    SpO2: 96% 96% 97% 99%   Weight change: 3 lb 9.6 oz (1.633 kg)  Intake/Output Summary (Last 24 hours) at 05/01/15 1027 Last data filed at 05/01/15 1025  Gross per 24 hour  Intake   1360 ml  Output      0 ml  Net   1360 ml   General: resting in bed, no acute distress HEENT: EOMI, no scleral icterus Cardiac: RRR, no rubs, murmurs or gallops Pulm: clear to auscultation bilaterally, moving normal volumes of air Abd: soft, nontender, nondistended, BS present Extremities: Right foot is still wrapped in bandage with moderate amount of drainage at right MTP.  She has intact sensation and ability to move her toes Neuro: alert and oriented X3, no focal deficits  Lab Results: Basic Metabolic Panel:  Recent Labs Lab 04/27/15 1655 04/28/15 0615 05/01/15 0844  NA 144 140  --   K 4.6 4.2  --   CL 109 110  --   CO2 25 24  --   GLUCOSE 105* 105*  --   BUN 15 13  --   CREATININE 1.14* 0.82 0.93  CALCIUM 9.5 9.2  --    Liver Function Tests: No results for input(s): AST, ALT, ALKPHOS, BILITOT, PROT, ALBUMIN in the last 168 hours. No results for input(s): LIPASE, AMYLASE in the last 168 hours. No results for input(s): AMMONIA in the last 168 hours. CBC:  Recent Labs Lab 04/27/15 1655 04/28/15 0615 05/01/15 0844  WBC 7.2 6.2 4.7    NEUTROABS 4.7  --  2.8  HGB 11.9* 10.7* 10.7*  HCT 37.9 33.7* 34.6*  MCV 100.0 99.1 100.3*  PLT 200 167 164   Cardiac Enzymes: No results for input(s): CKTOTAL, CKMB, CKMBINDEX, TROPONINI in the last 168 hours. BNP: No results for input(s): PROBNP in the last 168 hours. D-Dimer: No results for input(s): DDIMER in the last 168 hours. CBG:  Recent Labs Lab 04/29/15 2026 04/30/15 0806 04/30/15 1153 04/30/15 1633 04/30/15 2120 05/01/15 0735  GLUCAP 184* 116* 152* 153* 180* 117*   Hemoglobin A1C: No results for input(s): HGBA1C in the last 168 hours. Fasting Lipid Panel: No results for input(s): CHOL, HDL, LDLCALC, TRIG, CHOLHDL, LDLDIRECT in the last 168 hours. Thyroid Function Tests: No results for input(s): TSH, T4TOTAL, FREET4, T3FREE, THYROIDAB in the last 168 hours. Coagulation: No results for input(s): LABPROT, INR in the last 168 hours. Anemia Panel: No results for input(s): VITAMINB12, FOLATE, FERRITIN, TIBC, IRON, RETICCTPCT in the last 168 hours. Urine Drug Screen: Drugs of Abuse     Component Value Date/Time   LABOPIA NONE DETECTED 03/09/2013 2346   COCAINSCRNUR NONE DETECTED 03/09/2013 2346   LABBENZ NONE DETECTED 03/09/2013 2346   AMPHETMU  NONE DETECTED 03/09/2013 2346   THCU NONE DETECTED 03/09/2013 2346   LABBARB NONE DETECTED 03/09/2013 2346    Alcohol Level: No results for input(s): ETH in the last 168 hours. Urinalysis: No results for input(s): COLORURINE, LABSPEC, PHURINE, GLUCOSEU, HGBUR, BILIRUBINUR, KETONESUR, PROTEINUR, UROBILINOGEN, NITRITE, LEUKOCYTESUR in the last 168 hours.  Invalid input(s): APPERANCEUR   Micro Results: Recent Results (from the past 240 hour(s))  Wound culture     Status: None (Preliminary result)   Collection Time: 04/27/15  7:21 PM  Result Value Ref Range Status   Specimen Description WOUND RIGHT FOOT  Final   Special Requests NONE  Final   Gram Stain   Final    NO WBC SEEN NO SQUAMOUS EPITHELIAL CELLS  SEEN MODERATE GRAM POSITIVE COCCI IN PAIRS Performed at [facility]    Culture   Final    ABUNDANT STAPHYLOCOCCUS AUREUS Note: RIFAMPIN AND GENTAMICIN SHOULD NOT BE USED AS SINGLE DRUGS FOR TREATMENT OF STAPH INFECTIONS. Performed at [facility]    Report Status PENDING  Incomplete  Blood Cultures x 2 sites     Status: None (Preliminary result)   Collection Time: 04/27/15  7:56 PM  Result Value Ref Range Status   Specimen Description BLOOD RIGHT ANTECUBITAL  Final   Special Requests BOTTLES DRAWN AEROBIC ONLY 6CC  Final   Culture NO GROWTH 3 DAYS  Final   Report Status PENDING  Incomplete  Blood Cultures x 2 sites     Status: None (Preliminary result)   Collection Time: 04/27/15  8:06 PM  Result Value Ref Range Status   Specimen Description BLOOD LEFT ANTECUBITAL  Final   Special Requests BOTTLES DRAWN AEROBIC ONLY [place]  Final   Culture NO GROWTH 3 DAYS  Final   Report Status PENDING  Incomplete   Studies/Results: No results found. Medications:  Scheduled Meds: . aspirin  81 mg Oral q morning - 10a  . butalbital-acetaminophen-caffeine  1 tablet Oral QHS  . DULoxetine  60 mg Oral QAC breakfast  . enoxaparin (LOVENOX) injection  40 mg Subcutaneous Q24H  . feeding supplement (GLUCERNA SHAKE)  237 mL Oral TID BM  . gabapentin  1,200 mg Oral TID  . insulin aspart  0-15 Units Subcutaneous TID WC  . insulin aspart  0-5 Units Subcutaneous QHS  . insulin aspart  4 Units Subcutaneous TID WC  . pantoprazole  20 mg Oral Daily  . rosuvastatin  40 mg Oral Daily  . vancomycin  1,000 mg Intravenous Q12H   Continuous Infusions:  PRN Meds:.albuterol, HYDROcodone-acetaminophen, oxyCODONE Assessment/Plan: Principal Problem:   Diabetic foot ulcer (HCC) Active Problems:   Type 2 diabetes, uncontrolled, with neuropathy (HCC)   Hypertension   Diastolic dysfunction   Depression, major, recurrent ([place])  Diabetic Foot Ulcer - Right Foot - Consult to orthopedic surgery  given MRI findings of abscess, appreciate their assistance.  Patient on surgery schedule for this afternoon - Continue Vancomycin per pharmacy.  Monitor creatine - MRI Right foot:  soft tissue ulcer and cellulitis along the plantar aspect of the first MTP joint with a a 8 x 22 x 11 mm abscess deep to the ulceration. No evidence of osteomyelitis of the right forefoot - Blood cultures NG x 3 days - Wound culture w/ moderate GPC on Gram stain.  Culture with abundant Staph aureus.  Susceptibilities pending - Norco 5-325 1-2 tablets PO q4h PRN (this is a home medication for her) - Oxycodone IR 5mg  PO q4h PRN  DM2: Home  meds are Glipizide, Metformin, Sitagliptin. Last A1C 7.0 in Sept 2016 - Sliding scale insulin - moderate with HS coverage plus Novolog 4units TID with meals - Monitor CBGs -- 117 this AM - Gabapentin 1200mg  TID  HTN: Home meds are Lisinopril and Amlodipine - Stable off home meds - Continue to monitor BP  HLD: Home meds are Rosuvastatin and ASA  - Continue Rosuvastatin and ASA 81mg   Depression/Anxiety - Continue Duloxetine 60mg  daily  Chronic Headaches - Continue Fioricet QHS  COPD (no PFTs on file) - Albuterol nebs q6h prn  GERD - Continue Pantoprazole 20mg  daily  FEN Fluid: none Electrolytes: replete PRN Nutrition: NPO  DVT PPx: Lovenox  Code: Full  Dispo: Disposition is deferred at this time, awaiting improvement of current medical problems.    The patient does have a current PCP (Rushil Sherrye Payor, MD) and does not need an [facility] hospital follow-up appointment after discharge.  The patient does not have transportation limitations that hinder transportation to clinic appointments.   LOS: 4 days   Jule Ser, DO 05/01/2015, 10:27 AM

## 2015-05-01 NOTE — Progress Notes (Signed)
Internal Medicine Attending:   I saw and examined the patient. I reviewed the resident's note and I agree with the resident's findings and plan as documented in the resident's note.  Clinically the patient is stable today, pain is improved, less drainage from the bandage today. Plan for surgery today with amputation to definitively manage the abscess. Vancomycin for now, wound culture with staph aureus and she has a history of MRSA skin infections. We will monitor her overnight after the surgery and tailor antibiotic regimen based on susceptibilities.

## 2015-05-01 NOTE — Progress Notes (Signed)
Orthopedic Tech Progress Note Patient Details:  [name] 1951/05/01 [id number]  Ortho Devices Type of Ortho Device: Postop shoe/boot Ortho Device/Splint Location: RLE Ortho Device/Splint Interventions: Ordered, Application   Braulio Bosch 05/01/2015, 8:16 PM

## 2015-05-01 NOTE — H&P (View-Only) (Signed)
Reason for Consult: Ulceration right foot great toe MTP joint Referring Physician: Dr. Madelon Lips [name] is an 64 y.o. female.  HPI: Patient is a 64 year old woman who states she's had several year history of ulceration beneath the great toe MTP joint. She states that over the years this ulcer has broken down and closed over.  Past Medical History  Diagnosis Date  . Hypertension   . Diabetes mellitus without complication ([place])     diagnosed around 2010, only ever on metformin  . Chronic pain     neck pain, headache, neuropathy  . Hypertriglyceridemia   . Neuromuscular disorder ([place])   . COPD (chronic obstructive pulmonary disease) ([place])   . Depression   . Puncture wound of foot, right 05/17/2012    Tetanus shot 3 yrs ago at [facility] in [place], per pt report   . Brachial plexus disorders     Past Surgical History  Procedure Laterality Date  . Rotator cuff repair    . Carpal tunnel release    . Abdominal hysterectomy    . Cholecystectomy      Family History  Problem Relation Age of Onset  . Hyperlipidemia Mother   . Heart attack Father 89  . Hypertension Father   . Cancer Paternal Grandfather     Lung cancer  . Cancer Maternal Grandmother   . Heart attack Maternal Grandmother     Social History:  reports that she has been smoking Cigarettes.  She has been smoking about 0.00 packs per day for the past 41 years. She has never used smokeless tobacco. She reports that she does not drink alcohol or use illicit drugs.  Allergies:  Allergies  Allergen Reactions  . Flexeril [Cyclobenzaprine] Other (See Comments)    Prolonged QTc to 571, tachycardia  . Soma [Carisoprodol] Itching and Rash    Medications: I have reviewed the patient's current medications.  Results for orders placed or performed during the hospital encounter of 04/27/15 (from the past 48 hour(s))  Wound culture     Status: None (Preliminary result)   Collection Time: 04/27/15  7:21 PM  Result Value  Ref Range   Specimen Description WOUND RIGHT FOOT    Special Requests NONE    Gram Stain PENDING    Culture      Culture reincubated for better growth Performed at [facility]    Report Status PENDING   Glucose, capillary     Status: None   Collection Time: 04/27/15  7:51 PM  Result Value Ref Range   Glucose-Capillary 85 65 - 99 mg/dL  Blood Cultures x 2 sites     Status: None (Preliminary result)   Collection Time: 04/27/15  7:56 PM  Result Value Ref Range   Specimen Description BLOOD RIGHT ANTECUBITAL    Special Requests BOTTLES DRAWN AEROBIC ONLY 6CC    Culture NO GROWTH < 24 HOURS    Report Status PENDING   Blood Cultures x 2 sites     Status: None (Preliminary result)   Collection Time: 04/27/15  8:06 PM  Result Value Ref Range   Specimen Description BLOOD LEFT ANTECUBITAL    Special Requests BOTTLES DRAWN AEROBIC ONLY 6CC    Culture NO GROWTH < 24 HOURS    Report Status PENDING   HIV antibody     Status: None   Collection Time: 04/27/15  8:06 PM  Result Value Ref Range   HIV Screen 4th Generation wRfx Non Reactive Non Reactive    Comment: (  NOTE) Performed At: [facility] [place], [place] [id number] Lindon Romp MD [id number]   Prealbumin     Status: None   Collection Time: 04/27/15  8:06 PM  Result Value Ref Range   Prealbumin 23.4 18 - 38 mg/dL  Glucose, capillary     Status: Abnormal   Collection Time: 04/27/15 10:33 PM  Result Value Ref Range   Glucose-Capillary 178 (H) 65 - 99 mg/dL  CBC     Status: Abnormal   Collection Time: 04/28/15  6:15 AM  Result Value Ref Range   WBC 6.2 4.0 - 10.5 K/uL   RBC 3.40 (L) 3.87 - 5.11 MIL/uL   Hemoglobin 10.7 (L) 12.0 - 15.0 g/dL   HCT 33.7 (L) 36.0 - 46.0 %   MCV 99.1 78.0 - 100.0 fL   MCH 31.5 26.0 - 34.0 pg   MCHC 31.8 30.0 - 36.0 g/dL   RDW 14.2 11.5 - 15.5 %   Platelets 167 150 - 400 K/uL  Basic metabolic panel     Status: Abnormal   Collection Time: 04/28/15  6:15 AM   Result Value Ref Range   Sodium 140 135 - 145 mmol/L   Potassium 4.2 3.5 - 5.1 mmol/L   Chloride 110 101 - 111 mmol/L   CO2 24 22 - 32 mmol/L   Glucose, Bld 105 (H) 65 - 99 mg/dL   BUN 13 6 - 20 mg/dL   Creatinine, Ser 0.82 0.44 - 1.00 mg/dL   Calcium 9.2 8.9 - 10.3 mg/dL   GFR calc non Af Amer >60 >60 mL/min   GFR calc Af Amer >60 >60 mL/min    Comment: (NOTE) The eGFR has been calculated using the CKD EPI equation. This calculation has not been validated in all clinical situations. eGFR's persistently <60 mL/min signify possible Chronic Kidney Disease.    Anion gap 6 5 - 15  Glucose, capillary     Status: Abnormal   Collection Time: 04/28/15  8:07 AM  Result Value Ref Range   Glucose-Capillary 124 (H) 65 - 99 mg/dL  Glucose, capillary     Status: Abnormal   Collection Time: 04/28/15 11:36 AM  Result Value Ref Range   Glucose-Capillary 110 (H) 65 - 99 mg/dL  Glucose, capillary     Status: Abnormal   Collection Time: 04/28/15  5:25 PM  Result Value Ref Range   Glucose-Capillary 107 (H) 65 - 99 mg/dL  Glucose, capillary     Status: Abnormal   Collection Time: 04/28/15 10:11 PM  Result Value Ref Range   Glucose-Capillary 194 (H) 65 - 99 mg/dL  Glucose, capillary     Status: Abnormal   Collection Time: 04/29/15  7:48 AM  Result Value Ref Range   Glucose-Capillary 167 (H) 65 - 99 mg/dL  Glucose, capillary     Status: Abnormal   Collection Time: 04/29/15 12:03 PM  Result Value Ref Range   Glucose-Capillary 137 (H) 65 - 99 mg/dL    Mr Foot Right W Wo Contrast  04/28/2015  CLINICAL DATA:  Ulcer under the right toe for greater than 3 years. No prior injury. Purulent drainage. EXAM: MRI OF THE RIGHT FOREFOOT WITHOUT AND WITH CONTRAST TECHNIQUE: Multiplanar, multisequence MR imaging was performed both before and after administration of intravenous contrast. CONTRAST:  20 mL MultiHance COMPARISON:  None. FINDINGS: There is patient motion degrading image quality limiting  evaluation. There is mild marrow edema in the lateral hallux sesamoid with a transverse linear signal likely representing  a fractured sesamoid. There is minimal marrow edema in the medial hallux sesamoid. There is a bipartite medial hallux sesamoid. There is no other focal marrow signal abnormality. There is no acute fracture or dislocation. There is a small first MTP joint effusion. There is no bone destruction or periosteal reaction. The alignment is anatomic. There is a ulcer along the plantar aspect of the first MTP joint with severe surrounding soft tissue edema with enhancement. There is a underlying irregularly marginated 8 x 22 x 11 mm fluid collection most consistent with an abscess. The flexor, extensor peroneal tendons are intact. There is nonspecific soft tissue edema along the dorsal aspect of the midfoot likely reactive. IMPRESSION: 1. Soft tissue ulcer and cellulitis along the plantar aspect of the first MTP joint with a a 8 x 22 x 11 mm abscess deep to the ulceration. No evidence of osteomyelitis of the right forefoot. 2. Mild marrow edema in the lateral hallux sesamoid with a transverse linear signal likely representing a fractured sesamoid. Electronically Signed   By: Hetal  Patel   On: 04/28/2015 07:46    Review of Systems  All other systems reviewed and are negative.  Blood pressure 102/47, pulse 67, temperature 97.8 F (36.6 C), temperature source Oral, resp. rate 18, height 5' 6" (1.676 m), weight 89.2 kg (196 lb 10.4 oz), SpO2 97 %. Physical Exam On examination patient has a faintly palpable dorsalis pedis pulse. She does not have protective sensation. There is a necrotic ulcer beneath the great toe MTP joint. After informed consent and 11 blade knife was used to debride the skin and soft tissue back to the sesamoid bones. There was a large ulcer which is 15 mm in diameter proximally 10 mm deep to probes all the way down to the sesamoid bones. The MRI scan is reviewed which shows  changes consistent with an abscess but this appears to be more necrotic tissue than an abscess. The MRI scan is reviewed and does not show any changes consistent with osteomyelitis. 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  Plan: Discussed with the patient that her best option would be a first ray amputation. With the exposed tendon and bone salvage of the great toe is not possible. We will try to set up her surgery for Friday or Saturday.  DUDA,MARCUS V 04/29/2015, 12:47 PM      

## 2015-05-01 NOTE — Anesthesia Procedure Notes (Signed)
Procedure Name: LMA Insertion Date/Time: 05/01/2015 5:09 PM Performed by: Izora Gala Pre-anesthesia Checklist: Patient identified, Emergency Drugs available, Suction available and Patient being monitored Patient Re-evaluated:Patient Re-evaluated prior to inductionOxygen Delivery Method: Circle system utilized Preoxygenation: Pre-oxygenation with 100% oxygen Intubation Type: IV induction Ventilation: Mask ventilation without difficulty LMA: LMA inserted LMA Size: 4.0 Number of attempts: 1 Placement Confirmation: positive ETCO2 Tube secured with: Tape

## 2015-05-01 NOTE — Anesthesia Preprocedure Evaluation (Addendum)
Anesthesia Evaluation  Patient identified by MRN, date of birth, ID band Patient awake    Reviewed: Allergy & Precautions, NPO status , Patient's Chart, lab work & pertinent test results  Airway Mallampati: II  TM Distance: >3 FB     Dental   Pulmonary COPD, Current Smoker,    breath sounds clear to auscultation       Cardiovascular hypertension, Pt. on medications  Rhythm:Regular Rate:Normal     Neuro/Psych PSYCHIATRIC DISORDERS  Neuromuscular disease    GI/Hepatic Neg liver ROS, GERD  ,  Endo/Other  diabetes, Type 2  Renal/GU negative Renal ROS     Musculoskeletal  (+) Arthritis ,   Abdominal   Peds  Hematology  (+) anemia ,   Anesthesia Other Findings   Reproductive/Obstetrics                            Lab Results  Component Value Date   WBC 4.7 05/01/2015   HGB 10.7* 05/01/2015   HCT 34.6* 05/01/2015   MCV 100.3* 05/01/2015   PLT 164 05/01/2015   Lab Results  Component Value Date   CREATININE 0.93 05/01/2015   BUN 13 04/28/2015   NA 140 04/28/2015   K 4.2 04/28/2015   CL 110 04/28/2015   CO2 24 04/28/2015    Anesthesia Physical Anesthesia Plan  ASA: III  Anesthesia Plan: General   Post-op Pain Management:    Induction: Intravenous  Airway Management Planned: LMA  Additional Equipment:   Intra-op Plan:   Post-operative Plan: Extubation in OR  Informed Consent: I have reviewed the patients History and Physical, chart, labs and discussed the procedure including the risks, benefits and alternatives for the proposed anesthesia with the patient or authorized representative who has indicated his/her understanding and acceptance.   Dental advisory given  Plan Discussed with: CRNA  Anesthesia Plan Comments:        Anesthesia Quick Evaluation

## 2015-05-01 NOTE — Interval H&P Note (Signed)
History and Physical Interval Note:  05/01/2015 3:54 PM  [name]  has presented today for surgery, with the diagnosis of Abscess Right Foot  The various methods of treatment have been discussed with the patient and family. After consideration of risks, benefits and other options for treatment, the patient has consented to  Procedure(s): Right Foot 1st Ray Amputation (Right) as a surgical intervention .  The patient's history has been reviewed, patient examined, no change in status, stable for surgery.  I have reviewed the patient's chart and labs.  Questions were answered to the patient's satisfaction.     Azad Calame V

## 2015-05-01 NOTE — Transfer of Care (Signed)
Immediate Anesthesia Transfer of Care Note  Patient: [name]  Procedure(s) Performed: Procedure(s): Right Foot 1st Ray Amputation (Right)  Patient Location: PACU  Anesthesia Type:General  Level of Consciousness: awake, alert , oriented and patient cooperative  Airway & Oxygen Therapy: Patient Spontanous Breathing and Patient connected to nasal cannula oxygen  Post-op Assessment: Report given to RN, Post -op Vital signs reviewed and stable, Patient moving all extremities and Patient moving all extremities X 4  Post vital signs: Reviewed and stable  Last Vitals:  Filed Vitals:   05/01/15 1024  BP: 124/104  Pulse: 72  Temp: 36.8 C  Resp: 20    Complications: No apparent anesthesia complications

## 2015-05-01 NOTE — Anesthesia Postprocedure Evaluation (Signed)
  Anesthesia Post-op Note  Patient: [name]  Procedure(s) Performed: Procedure(s): Right Foot 1st Ray Amputation (Right)  Patient Location: PACU  Anesthesia Type: General   Level of Consciousness: awake, alert  and oriented  Airway and Oxygen Therapy: Patient Spontanous Breathing  Post-op Pain: none  Post-op Assessment: Post-op Vital signs reviewed  Post-op Vital Signs: Reviewed  Last Vitals:  Filed Vitals:   05/01/15 1755  BP: 142/79  Pulse: 77  Temp: 36.6 C  Resp: 18    Complications: No apparent anesthesia complications

## 2015-05-02 DIAGNOSIS — Z89411 Acquired absence of right great toe: Secondary | ICD-10-CM

## 2015-05-02 LAB — CULTURE, BLOOD (ROUTINE X 2)
CULTURE: NO GROWTH
Culture: NO GROWTH

## 2015-05-02 LAB — GLUCOSE, CAPILLARY
GLUCOSE-CAPILLARY: 117 mg/dL — AB (ref 65–99)
GLUCOSE-CAPILLARY: 165 mg/dL — AB (ref 65–99)
GLUCOSE-CAPILLARY: 117 mg/dL — AB (ref 65–99)
Glucose-Capillary: 165 mg/dL — ABNORMAL HIGH (ref 65–99)

## 2015-05-02 MED ORDER — SULFAMETHOXAZOLE-TRIMETHOPRIM 800-160 MG PO TABS
1.0000 | ORAL_TABLET | Freq: Two times a day (BID) | ORAL | Status: DC
Start: 2015-05-02 — End: 2015-05-03
  Administered 2015-05-02 – 2015-05-03 (×2): 1 via ORAL
  Filled 2015-05-02 (×2): qty 1

## 2015-05-02 MED ORDER — SENNOSIDES-DOCUSATE SODIUM 8.6-50 MG PO TABS: 1.0000 | ORAL_TABLET | Freq: Every evening | ORAL | Status: DC | PRN

## 2015-05-02 MED ORDER — METHOCARBAMOL 500 MG PO TABS: 500.0000 mg | ORAL_TABLET | Freq: Four times a day (QID) | ORAL | Status: DC | PRN

## 2015-05-02 MED ORDER — BISACODYL 10 MG RE SUPP
10.0000 mg | Freq: Once | RECTAL | Status: AC
  Administered 2015-05-02: 10 mg via RECTAL
  Filled 2015-05-02: qty 1

## 2015-05-02 MED ORDER — SULFAMETHOXAZOLE-TRIMETHOPRIM 800-160 MG PO TABS: 1.0000 | ORAL_TABLET | Freq: Two times a day (BID) | ORAL | Status: DC

## 2015-05-02 MED ORDER — OXYCODONE HCL 15 MG PO TABS: 15.0000 mg | ORAL_TABLET | ORAL | Status: DC | PRN

## 2015-05-02 NOTE — Progress Notes (Addendum)
Subjective: Patient seen and examined this morning.  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  She is doing okay today but does state her pain is not currently well controlled.  Objective: Vital signs in last 24 hours: Filed Vitals:   05/01/15 1755 05/01/15 2004 05/02/15 0511 05/02/15 1047  BP: 142/79 122/82 99/53 99/60   Pulse: 77 83 63 60  Temp: 97.9 F (36.6 C) 98.4 F (36.9 C) 98.3 F (36.8 C) 98.1 F (36.7 C)  TempSrc: Oral   Oral  Resp: 18 18 18 18   Height:      Weight:  193 lb (87.544 kg)    SpO2: 96% 96% 96% 98%   Weight change: -5 lb 9.6 oz (-2.54 kg)  Intake/Output Summary (Last 24 hours) at 05/02/15 1203 Last data filed at 05/02/15 1047  Gross per 24 hour  Intake   1280 ml  Output    300 ml  Net    980 ml   General: resting in bed, no acute distress HEENT: EOMI, no scleral icterus Cardiac: RRR, no rubs, murmurs or gallops Pulm: clear to auscultation bilaterally, moving normal volumes of air Abd: soft, nontender, nondistended, BS present Extremities: Right foot wrapped.  Minimal drainage.  Able to wiggle toes and sensation is intact. Neuro: alert and oriented X3, no focal deficits  Lab Results: Basic Metabolic Panel:  Recent Labs Lab 04/27/15 1655 04/28/15 0615 05/01/15 0844  NA 144 140  --   K 4.6 4.2  --   CL 109 110  --   CO2 25 24  --   GLUCOSE 105* 105*  --   BUN 15 13  --   CREATININE 1.14* 0.82 0.93  CALCIUM 9.5 9.2  --    Liver Function Tests: No results for input(s): AST, ALT, ALKPHOS, BILITOT, PROT, ALBUMIN in the last 168 hours. No results for input(s): LIPASE, AMYLASE in the last 168 hours. No results for input(s): AMMONIA in the last 168 hours. CBC:  Recent Labs Lab 04/27/15 1655 04/28/15 0615 05/01/15 0844  WBC 7.2 6.2 4.7  NEUTROABS 4.7  --  2.8  HGB 11.9* 10.7* 10.7*  HCT 37.9 33.7* 34.6*  MCV 100.0 99.1 100.3*  PLT 200 167 164   Cardiac Enzymes: No results for  input(s): CKTOTAL, CKMB, CKMBINDEX, TROPONINI in the last 168 hours. BNP: No results for input(s): PROBNP in the last 168 hours. D-Dimer: No results for input(s): DDIMER in the last 168 hours. CBG:  Recent Labs Lab 04/30/15 2120 05/01/15 0735 05/01/15 1146 05/01/15 1546 05/01/15 1958 05/02/15 0753  GLUCAP 180* 117* 196* 75 195* 117*   Hemoglobin A1C: No results for input(s): HGBA1C in the last 168 hours. Fasting Lipid Panel: No results for input(s): CHOL, HDL, LDLCALC, TRIG, CHOLHDL, LDLDIRECT in the last 168 hours. Thyroid Function Tests: No results for input(s): TSH, T4TOTAL, FREET4, T3FREE, THYROIDAB in the last 168 hours. Coagulation: No results for input(s): LABPROT, INR in the last 168 hours. Anemia Panel: No results for input(s): VITAMINB12, FOLATE, FERRITIN, TIBC, IRON, RETICCTPCT in the last 168 hours. Urine Drug Screen: Drugs of Abuse     Component Value Date/Time   LABOPIA NONE DETECTED 03/09/2013 2346   COCAINSCRNUR NONE DETECTED 03/09/2013 2346   LABBENZ NONE DETECTED 03/09/2013 2346   AMPHETMU NONE DETECTED 03/09/2013 2346   THCU NONE DETECTED 03/09/2013 2346   LABBARB NONE DETECTED 03/09/2013 2346    Alcohol Level: No results for input(s): ETH in  the last 168 hours. Urinalysis: No results for input(s): COLORURINE, LABSPEC, PHURINE, GLUCOSEU, HGBUR, BILIRUBINUR, KETONESUR, PROTEINUR, UROBILINOGEN, NITRITE, LEUKOCYTESUR in the last 168 hours.  Invalid input(s): APPERANCEUR   Micro Results: Recent Results (from the past 240 hour(s))  Wound culture     Status: None   Collection Time: 04/27/15  7:21 PM  Result Value Ref Range Status   Specimen Description WOUND RIGHT FOOT  Final   Special Requests NONE  Final   Gram Stain   Final    NO WBC SEEN NO SQUAMOUS EPITHELIAL CELLS SEEN MODERATE GRAM POSITIVE COCCI IN PAIRS Performed at [facility]    Culture   Final    ABUNDANT STAPHYLOCOCCUS AUREUS Note: RIFAMPIN AND GENTAMICIN SHOULD NOT BE  USED AS SINGLE DRUGS FOR TREATMENT OF STAPH INFECTIONS. Performed at [facility]    Report Status 05/01/2015 FINAL  Final   Organism ID, Bacteria STAPHYLOCOCCUS AUREUS  Final      Susceptibility   Staphylococcus aureus - MIC*    CLINDAMYCIN >=8 RESISTANT Resistant     ERYTHROMYCIN 0.5 SENSITIVE Sensitive     GENTAMICIN <=0.5 SENSITIVE Sensitive     LEVOFLOXACIN 0.25 SENSITIVE Sensitive     OXACILLIN 0.5 SENSITIVE Sensitive     RIFAMPIN <=0.5 SENSITIVE Sensitive     TRIMETH/SULFA <=10 SENSITIVE Sensitive     VANCOMYCIN 1 SENSITIVE Sensitive     TETRACYCLINE <=1 SENSITIVE Sensitive     MOXIFLOXACIN <=0.25 SENSITIVE Sensitive     * ABUNDANT STAPHYLOCOCCUS AUREUS  Blood Cultures x 2 sites     Status: None (Preliminary result)   Collection Time: 04/27/15  7:56 PM  Result Value Ref Range Status   Specimen Description BLOOD RIGHT ANTECUBITAL  Final   Special Requests BOTTLES DRAWN AEROBIC ONLY 6CC  Final   Culture NO GROWTH 4 DAYS  Final   Report Status PENDING  Incomplete  Blood Cultures x 2 sites     Status: None (Preliminary result)   Collection Time: 04/27/15  8:06 PM  Result Value Ref Range Status   Specimen Description BLOOD LEFT ANTECUBITAL  Final   Special Requests BOTTLES DRAWN AEROBIC ONLY 6CC  Final   Culture NO GROWTH 4 DAYS  Final   Report Status PENDING  Incomplete  Surgical pcr screen     Status: Abnormal   Collection Time: 05/01/15  1:42 PM  Result Value Ref Range Status   MRSA, PCR NEGATIVE NEGATIVE Final   Staphylococcus aureus POSITIVE (A) NEGATIVE Final    Comment:        The Xpert SA Assay (FDA approved for NASAL specimens in patients over 63 years of age), is one component of a comprehensive surveillance program.  Test performance has been validated by [facility] for patients greater than or equal to 88 year old. It is not intended to diagnose infection nor to guide or monitor treatment.    Studies/Results: No results found. Medications:    Scheduled Meds: . aspirin  81 mg Oral q morning - 10a  . butalbital-acetaminophen-caffeine  1 tablet Oral QHS  . DULoxetine  60 mg Oral QAC breakfast  . enoxaparin (LOVENOX) injection  40 mg Subcutaneous Q24H  . feeding supplement (GLUCERNA SHAKE)  237 mL Oral TID BM  . gabapentin  1,200 mg Oral TID  . insulin aspart  0-15 Units Subcutaneous TID WC  . insulin aspart  0-5 Units Subcutaneous QHS  . insulin aspart  4 Units Subcutaneous TID WC  . mupirocin ointment  1 application  Nasal BID  . pantoprazole  20 mg Oral Daily  . rosuvastatin  40 mg Oral Daily  . senna-docusate  1 tablet Oral QHS  . vancomycin  1,000 mg Intravenous Q12H   Continuous Infusions: . sodium chloride    . lactated ringers 10 mL/hr at 05/01/15 1612   PRN Meds:.acetaminophen **OR** acetaminophen, albuterol, HYDROcodone-acetaminophen, HYDROcodone-acetaminophen, HYDROmorphone (DILAUDID) injection, methocarbamol **OR** methocarbamol (ROBAXIN)  IV, metoCLOPramide **OR** metoCLOPramide (REGLAN) injection, ondansetron **OR** ondansetron (ZOFRAN) IV, oxyCODONE, promethazine Assessment/Plan: Principal Problem:   Diabetic foot ulcer (HCC) Active Problems:   Type 2 diabetes, uncontrolled, with neuropathy (HCC)   Hypertension   Diastolic dysfunction   Depression, major, recurrent (HCC)  Diabetic Foot Ulcer - Right Foot - Consult to orthopedic surgery given MRI findings.  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 - Discontinue Vancomycin with susceptibilities returned on wound culture.  Pan-sensitive Staph aureus with exception of Clindamycin - Start TMP-SMX 800-160mg  PO BID x 2 weeks.  First day of tx will be surgery date.  Last day of tx 05/15/15 - Blood cultures NG x 4 days - After speaking with orthopedics, okay to discharge home with pain medications and senna to avoid constipation - PT recommending rolling walker and home health PT  DM2: Home meds are Glipizide, Metformin, Sitagliptin. Last A1C 7.0 in Sept  2016 - Sliding scale insulin - moderate with HS coverage plus Novolog 4units TID with meals - Monitor CBGs -- 117 this AM - Gabapentin 1200mg  TID  HTN: Home meds are Lisinopril and Amlodipine - Stable off home meds - Continue to monitor BP  HLD: Home meds are Rosuvastatin and ASA  - Continue Rosuvastatin and ASA 81mg   Depression/Anxiety - Continue Duloxetine 60mg  daily  Chronic Headaches - Continue Fioricet QHS  COPD (no PFTs on file) - Albuterol nebs q6h prn  GERD - Continue Pantoprazole 20mg  daily  FEN Fluid: none Electrolytes: replete PRN Nutrition: NPO  DVT PPx: Lovenox  Code: Full  Dispo: Discharge tomorrow with ortho and [facility] follow up.  The patient does have a current PCP (Rushil Sherrye Payor, MD) and does not need an [facility] hospital follow-up appointment after discharge.  The patient does not have transportation limitations that hinder transportation to clinic appointments.   LOS: 5 days   Jule Ser, DO 05/02/2015, 12:03 PM

## 2015-05-02 NOTE — Evaluation (Signed)
Physical Therapy Evaluation Patient Details Name: [name] MRN: [id number] DOB: 06/30/1950 Today's Date: 05/02/2015   History of Present Illness  64 yo female with onset of ulcer and drainage now has amputation of first ray R foot, cellulitis and sesamoid fracture and history DM  Clinical Impression  Pt was able to walk but shorter trip based on excessive foot pain.  Will keep her on for tomorrow to hopefully have managed pain and take her for steps check and then can release to home.  Very motivated and will expect this to be fine.    Follow Up Recommendations Home health PT    Equipment Recommendations  Rolling walker with 5" wheels    Recommendations for Other Services       Precautions / Restrictions Precautions Precautions: Fall Precaution Comments: must be up in cast shoe WBAT Required Braces or Orthoses: Other Brace/Splint (R cast shoe) Restrictions Weight Bearing Restrictions: Yes Other Position/Activity Restrictions: WBAT      Mobility  Bed Mobility Overal bed mobility: Modified Independent                Transfers Overall transfer level: Modified independent Equipment used: Rolling walker (2 wheeled);1 person hand held assist             General transfer comment: PT guarding for safety and to manage RLE pain with walker  Ambulation/Gait Ambulation/Gait assistance: Min guard Ambulation Distance (Feet): 40 Feet Assistive device: Rolling walker (2 wheeled);1 person hand held assist Gait Pattern/deviations: Step-to pattern;Decreased stance time - right;Decreased step length - left;Trunk flexed Gait velocity: reduced Gait velocity interpretation: Below normal speed for age/gender General Gait Details: Pt using a walker with controlled WBing on RLE with walker  Stairs            Wheelchair Mobility    Modified Rankin (Stroke Patients Only)       Balance Overall balance assessment: No apparent balance deficits (not formally  assessed)                                           Pertinent Vitals/Pain Pain Assessment: 0-10 Pain Score: 10-Worst pain ever Pain Location: R foot in standing Pain Descriptors / Indicators: Aching;Operative site guarding Pain Intervention(s): Limited activity within patient's tolerance;Monitored during session;Premedicated before session;Repositioned    Home Living Family/patient expects to be discharged to:: Private residence Living Arrangements: Children;Other relatives Available Help at Discharge: Family;Available 24 hours/day Type of Home: House Home Access: Stairs to enter Entrance Stairs-Rails: None (uses pillars at entrance) Technical brewer of Steps: 2 Home Layout: One level Home Equipment: None      Prior Function Level of Independence: Independent               Hand Dominance        Extremity/Trunk Assessment   Upper Extremity Assessment: Overall WFL for tasks assessed           Lower Extremity Assessment: Overall WFL for tasks assessed      Cervical / Trunk Assessment: Normal  Communication   Communication: No difficulties  Cognition Arousal/Alertness: Awake/alert Behavior During Therapy: WFL for tasks assessed/performed Overall Cognitive Status: Within Functional Limits for tasks assessed                      General Comments General comments (skin integrity, edema, etc.): Pt needs to get her pain managed but  also need to ensure she can safely navigate steps to go home.  Her plan is to get up stairs then can safely plan for home    Exercises        Assessment/Plan    PT Assessment Patient needs continued PT services  PT Diagnosis Difficulty walking;Acute pain   PT Problem List Decreased strength;Decreased range of motion;Decreased activity tolerance;Decreased balance;Decreased mobility;Decreased coordination;Decreased knowledge of use of DME;Decreased safety awareness;Decreased knowledge of  precautions;Obesity;Pain;Decreased skin integrity  PT Treatment Interventions DME instruction;Gait training;Stair training;Functional mobility training;Therapeutic activities;Therapeutic exercise;Balance training;Neuromuscular re-education;Patient/family education   PT Goals (Current goals can be found in the Care Plan section) Acute Rehab PT Goals Patient Stated Goal: to get home PT Goal Formulation: With patient Time For Goal Achievement: 05/16/15 Potential to Achieve Goals: Good    Frequency Min 3X/week   Barriers to discharge Inaccessible home environment stairs to navigate in to house    Co-evaluation               End of Session Equipment Utilized During Treatment: Gait belt Activity Tolerance: Patient tolerated treatment well;Patient limited by pain Patient left: in bed;with call bell/phone within reach (sitting bedside) Nurse Communication: Mobility status;Weight bearing status         Time: [id number] PT Time Calculation (min) (ACUTE ONLY): 32 min   Charges:   PT Evaluation $Initial PT Evaluation Tier I: 1 Procedure PT Treatments $Gait Training: 8-22 mins   PT G CodesRamond Dial May 08, 2015, 3:14 PM   Mee Hives, PT MS Acute Rehab Dept. Number: [facility] [phone] and [facility] [phone]

## 2015-05-02 NOTE — Discharge Instructions (Signed)
You are being discharged with an antibiotic to take for 2 weeks.  Please take Bactrim twice daily until December 2.  This will help treat the infection that caused an abscess in your right foot.  We are also going to give you extra pain medicine to help keep your pain under control as you recover from your surgery.  Please take the Oxycodone 15mg  every 4 hours as needed for severe pain.  During this time, please DO NOT take your home dose of Norco.  Your Blood Pressure medicines were not given while you were in the hospital and your Blood Pressure was stable.  When you go home, please continue to take your Lisinopril daily, but hold off on taking your Amlodipine until your follow up with Dr. Posey Pronto on 05/18/2015.  At that time you can discuss restarting your Amlodipine if appropriate.  Please see the follow-up instructions regarding scheduling an appointment with Dr. Sharol Given and your appointment with Dr. Posey Pronto in the [facility].

## 2015-05-02 NOTE — Discharge Summary (Signed)
Name: [name] MRN: [id number] DOB: 10-Jan-1951 64 y.o. PCP: Riccardo Dubin, MD  Date of Admission: 04/27/2015  5:36 PM Date of Discharge: 05/03/2015 Attending Physician: Axel Filler, MD  Discharge Diagnosis: Principal Problem: Diabetic Foot Ulcer of Right Foot  Active Problems:  Type 2 DM HTN HLD Depression/Anxiety Chronic Headaches COPD GERD  Discharge Medications:   Medication List    STOP taking these medications        amLODipine 5 MG tablet  Commonly known as:  NORVASC     clindamycin 150 MG capsule  Commonly known as:  CLEOCIN     diclofenac sodium 1 % Gel  Commonly known as:  VOLTAREN     HYDROcodone-acetaminophen 5-325 MG tablet  Commonly known as:  NORCO/VICODIN     oxyCODONE-acetaminophen 10-325 MG tablet  Commonly known as:  PERCOCET      TAKE these medications        albuterol 108 (90 BASE) MCG/ACT inhaler  Commonly known as:  PROVENTIL HFA;VENTOLIN HFA  Inhale 1-2 puffs into the lungs every 6 (six) hours as needed for wheezing or shortness of breath.     aspirin 81 MG tablet  Take 81 mg by mouth every morning.     butalbital-acetaminophen-caffeine 50-325-40 MG tablet  Commonly known as:  FIORICET, ESGIC  Take 1 tablet by mouth at bedtime.     [employer] NASAL ALLERGY SPRAY 55 MCG/ACT Aero nasal inhaler  Generic drug:  triamcinolone  Place 2 sprays into the nose daily as needed (allergies).     DULoxetine 60 MG capsule  Commonly known as:  CYMBALTA  Take 1 capsule (60 mg total) by mouth every morning.     gabapentin 600 MG tablet  Commonly known as:  NEURONTIN  Take 2 tablets (1,200 mg total) by mouth 3 (three) times daily.     glipiZIDE 10 MG 24 hr tablet  Commonly known as:  GLUCOTROL XL  Take 1 tablet (10 mg total) by mouth daily with supper.     lisinopril 40 MG tablet  Commonly known as:  PRINIVIL,ZESTRIL  take 1 tablet by mouth once daily     metFORMIN 750 MG 24 hr tablet  Commonly known as:  GLUCOPHAGE-XR    Take 2 tablets (1,500 mg total) by mouth daily with breakfast.     methocarbamol 500 MG tablet  Commonly known as:  ROBAXIN  Take 1 tablet (500 mg total) by mouth every 6 (six) hours as needed for muscle spasms.     oxyCODONE 15 MG immediate release tablet  Commonly known as:  ROXICODONE  Take 1 tablet (15 mg total) by mouth every 4 (four) hours as needed for pain.     pantoprazole 20 MG tablet  Commonly known as:  PROTONIX  Take 1 tablet (20 mg total) by mouth daily.     rosuvastatin 40 MG tablet  Commonly known as:  CRESTOR  Take 1 tablet (40 mg total) by mouth daily.     senna-docusate 8.6-50 MG tablet  Commonly known as:  Senokot-S  Take 2 tablets by mouth 2 (two) times daily.     sitaGLIPtin 100 MG tablet  Commonly known as:  JANUVIA  Take 1 tablet (100 mg total) by mouth daily.     sulfamethoxazole-trimethoprim 800-160 MG tablet  Commonly known as:  BACTRIM DS,SEPTRA DS  Take 1 tablet by mouth every 12 (twelve) hours.        Disposition and follow-up:   Ms.Arly Kimbley was discharged from [facility] in Stable condition.  At the hospital follow up visit please address:  1.  Completion of additional 7 days of TMP-SMX (last day of tx 05/10/15) for diabetic foot ulcer and underlying abscess on plantar right foot.  Please also ensure her surgical wound and ulcer are healing.  Please address patients BP regimen: we held her amlodipine and lisinopril in the hospital.  Her BP was soft after surgery and at discharge we held her amlodipine.  Please assess whether to add back on amlodipine.  Please ensure patient has followed up appropriately with Dr. Sharol Given.  2.  Labs / imaging needed at time of follow-up: none  3.  Pending labs/ test needing follow-up: none  Follow-up Appointments: Follow-up Information    Follow up with Newt Minion, MD.   Specialty:  Orthopedic Surgery   Why:  Call office to schedule appointment for later next week   Contact  information:   [address] [place] [address] [phone]       Follow up with Charlott Rakes, MD On 05/18/2015.   Specialty:  Internal Medicine   Why:  Appointment at 9:45 AM   Contact information:   [address] [place] [id number] [phone]       Discharge Instructions: Discharge Instructions    Diet - low sodium heart healthy    Complete by:  As directed      Diet - low sodium heart healthy    Complete by:  As directed      Increase activity slowly    Complete by:  As directed      Increase activity slowly    Complete by:  As directed            Consultations:    Procedures Performed:  Mr Foot Right W Wo Contrast  04/28/2015  CLINICAL DATA:  Ulcer under the right toe for greater than 3 years. No prior injury. Purulent drainage. EXAM: MRI OF THE RIGHT FOREFOOT WITHOUT AND WITH CONTRAST TECHNIQUE: Multiplanar, multisequence MR imaging was performed both before and after administration of intravenous contrast. CONTRAST:  20 mL MultiHance COMPARISON:  None. FINDINGS: There is patient motion degrading image quality limiting evaluation. There is mild marrow edema in the lateral hallux sesamoid with a transverse linear signal likely representing a fractured sesamoid. There is minimal marrow edema in the medial hallux sesamoid. There is a bipartite medial hallux sesamoid. There is no other focal marrow signal abnormality. There is no acute fracture or dislocation. There is a small first MTP joint effusion. There is no bone destruction or periosteal reaction. The alignment is anatomic. There is a ulcer along the plantar aspect of the first MTP joint with severe surrounding soft tissue edema with enhancement. There is a underlying irregularly marginated 8 x 22 x 11 mm fluid collection most consistent with an abscess. The flexor, extensor peroneal tendons are intact. There is nonspecific soft tissue edema along the dorsal aspect of the midfoot likely reactive. IMPRESSION:  1. Soft tissue ulcer and cellulitis along the plantar aspect of the first MTP joint with a a 8 x 22 x 11 mm abscess deep to the ulceration. No evidence of osteomyelitis of the right forefoot. 2. Mild marrow edema in the lateral hallux sesamoid with a transverse linear signal likely representing a fractured sesamoid. Electronically Signed   By: Kathreen Devoid   On: 04/28/2015 07:46   Dg Foot Complete Right  04/23/2015  CLINICAL DATA:  Plantar foot ulcer near the  first metatarsal phalangeal joint. EXAM: RIGHT FOOT COMPLETE - 3+ VIEW COMPARISON:  None. FINDINGS: The joint spaces are maintained. Mild degenerative changes. Chronic cortical thickening/ periosteal reaction involving the metatarsals may be due to chronic stress related process or venous stasis disease. No acute fracture or destructive bony changes to suggest osteomyelitis. No obvious findings for septic arthritis. No gas is seen in the soft tissues. IMPRESSION: No acute bony findings or definite plain film findings for septic arthritis or osteomyelitis. Chronic periosteal reaction involving the second through fifth metatarsals. Electronically Signed   By: Marijo Sanes M.D.   On: 04/23/2015 23:37    2D Echo: none  Cardiac Cath: none  Admission HPI: Ms. Jullian Previti is a 64 y.o. female with past medical history of DM2, HTN, HLD, tobacco abuse, chronic pain, COPD, depression who presents to the hospital today as a direct admit from the [facility] with a diabetic foot ulcer located on the plantar surface of her right foot at the first MTP area. Patient states that 3 years ago she had a similar type ulcer in the exact spot that spontaneously healed itself. She has not had any problems with this area of her foot for 3 years. About 2-3 weeks ago, she noticed a recurrence of an ulcer at this location. She denies any injury that she is aware of. She reports wearing flip-flops most of the time. She reports some clear drainage from the  wound and associated redness and swelling. She has been trying to keep the area clean with topical abx, keeping it bandaged, covered, and clean. She also reports a burning type pain in her foot that extends to the mid-calf region. She has not had any fever or chills. Denies any injury to the area. Denies nausea, vomiting, chest pain, SOB, abdominal pain, urinary complaints, or change in bowel habits. Prior to today, patient was seen in the Emergency Department on 11/10 and given a prescription for Clindamycin 368m TID, which patient reports she has been taking as prescribed. Foot x-ray at that time showed no acute bony findings or definite plain film findings for septic arthritis or osteomyelitis. Since this ED visit, patient states her right foot got more swollen and tender to palpation. Patient does have pre-existing neuropathy for which she takes Gabapentin and her last Hgb A1C was 7.0 (Sept 2016).  CBC today reveals no leukocytosis, BMP with improved creatine from 4 days ago, and CRP/ESR are pending at time of admission.  Hospital Course by problem list:   Diabetic Foot Ulcer - Right Foot Patient presented with non-healing diabetic foot ulcer on the plantar surface of her right foot that had not responded to 3 days of Clindamycin.  Patient was afebrile and did not have a leukocytosis.  Antibiotics were initially held and wound cultures, blood cultures were obtained.  After MRI revealed an abscess and no osteomyelitis, empiric antibiotics were initiated.  ABIs obtained were normal, TBIs were abnormal.  Blood cultures remained negative throughout admission.  Wound cultures grew out MSSA that was pan-sensitive with the exception of being resistant to Clindamycin.  Orthopedic surgery was consulted to evaluate abscess after MRI findings and Dr. DSharol Givenrecommended first ray amputation on right foot.  This was successfully completed on 05/01/2015.  This was the same day as the susceptibilities returned  from the wound culture and we narrowed her antibiotic coverage to TMP-SMX to complete 10 days of therapy from date of surgery (last day of tx 11/27).  Her pain was adequately controlled  on oral pain medications and with as needed IV Dilaudid after her surgery.  She was discharged with a short course of Oxycoddone 76m q4h and Senokot-S and advised to not take her home Norco while on the added Oxycodone.  DM2: Home meds are Glipizide, Metformin, Sitagliptin. Last A1C 7.0 in Sept 2016.  She was placed on sliding scale insulin - moderate with HS coverage plus Novolog 4units TID with meals.  We monitored CBGs with this insulin schedule and maintained adequate glycemic control.  We also continued her Gabapentin 12037mTID  HTN: Home meds are Lisinopril and Amlodipine.  We held these throughout admission and her BP was stable.  At discharge, we continued Lisinopril and held Amlodipine.  Please assess whether to restart Amlodipine at follow up.  HLD: Home meds are Rosuvastatin and ASA.  We ontinued Rosuvastatin and ASA 8131m Depression/Anxiety: we continued Duloxetine 42m74mily  Chronic Headaches: We continued Fioricet QHS  GERD: We continued Pantoprazole 20mg42mly  Discharge Vitals:   BP 101/66 mmHg  Pulse 88  Temp(Src) 98.7 F (37.1 C) (Oral)  Resp 18  Ht 5' 6"  (1.676 m)  Wt 192 lb 14.1 oz (87.491 kg)  BMI 31.15 kg/m2  SpO2 98% General: sitting up in chair, NAD HEENT: [place]/AT, EOMI, no scleral icterus, mucus membranes moist  CV: RRR, no m/g/r Pulm: CTA bilaterally, breaths non-labored Abd: BS+, soft, nontender, nondistended Ext: Right foot wrapped. Minimal drainage. Able to wiggle toes and sensation is intact. Neuro: alert and oriented X 3  Discharge Labs:  Results for orders placed or performed during the hospital encounter of 04/27/15 (from the past 24 hour(s))  Glucose, capillary     Status: Abnormal   Collection Time: 05/02/15  4:56 PM  Result Value Ref Range    Glucose-Capillary 165 (H) 65 - 99 mg/dL  Glucose, capillary     Status: Abnormal   Collection Time: 05/02/15  8:54 PM  Result Value Ref Range   Glucose-Capillary 117 (H) 65 - 99 mg/dL  Glucose, capillary     Status: Abnormal   Collection Time: 05/03/15  7:37 AM  Result Value Ref Range   Glucose-Capillary 128 (H) 65 - 99 mg/dL  Glucose, capillary     Status: Abnormal   Collection Time: 05/03/15 12:15 PM  Result Value Ref Range   Glucose-Capillary 121 (H) 65 - 99 mg/dL    Signed: CarlyJuliet Rude11/20/2016, 12:51 PM    Services Ordered on Discharge: home health PT Equipment Ordered on Discharge: rolling walker with 5" wheels

## 2015-05-03 LAB — GLUCOSE, CAPILLARY
GLUCOSE-CAPILLARY: 128 mg/dL — AB (ref 65–99)
GLUCOSE-CAPILLARY: 145 mg/dL — AB (ref 65–99)
Glucose-Capillary: 121 mg/dL — ABNORMAL HIGH (ref 65–99)

## 2015-05-03 MED ORDER — SENNOSIDES-DOCUSATE SODIUM 8.6-50 MG PO TABS
2.0000 | ORAL_TABLET | Freq: Two times a day (BID) | ORAL | Status: DC
  Administered 2015-05-03: 2 via ORAL
  Filled 2015-05-03: qty 2

## 2015-05-03 MED ORDER — SENNOSIDES-DOCUSATE SODIUM 8.6-50 MG PO TABS: 2.0000 | ORAL_TABLET | Freq: Two times a day (BID) | ORAL | Status: DC

## 2015-05-03 MED ORDER — SULFAMETHOXAZOLE-TRIMETHOPRIM 800-160 MG PO TABS: 1.0000 | ORAL_TABLET | Freq: Two times a day (BID) | ORAL | Status: DC

## 2015-05-03 MED ORDER — SENNOSIDES-DOCUSATE SODIUM 8.6-50 MG PO TABS: 1.0000 | ORAL_TABLET | Freq: Two times a day (BID) | ORAL | Status: DC

## 2015-05-03 NOTE — Progress Notes (Signed)
Physical Therapy Treatment Patient Details Name: [name] MRN: [id number] DOB: Jul 04, 1950 Today's Date: 05/03/2015    History of Present Illness 64 yo female with onset of ulcer and drainage now has amputation of first ray R foot, cellulitis and sesamoid fracture and history DM    PT Comments    Progressing well, tolerated stair training and performs this task safely. Intermittently bearing weight through Rt heel with post op shoe in place. Patient states orthopedic MD told her she could bear weight through heel however she has a NWB status for RLE ordered. States she will have good family support at home.   Follow Up Recommendations  Home health PT     Equipment Recommendations  Rolling walker with 5" wheels    Recommendations for Other Services       Precautions / Restrictions Precautions Precautions: Fall Required Braces or Orthoses: Other Brace/Splint (R post op shoe) Restrictions Weight Bearing Restrictions: Yes Other Position/Activity Restrictions: NWB RLE    Mobility  Bed Mobility Overal bed mobility: Modified Independent                Transfers Overall transfer level: Needs assistance Equipment used: Rolling walker (2 wheeled) Transfers: Sit to/from Stand Sit to Stand: Supervision         General transfer comment: supervision for safety. VC for technique and hand placement to rise and sit. No loss of balance with RW for support.  Ambulation/Gait Ambulation/Gait assistance: Supervision Ambulation Distance (Feet): 55 Feet Assistive device: Rolling walker (2 wheeled) Gait Pattern/deviations: Step-to pattern;Decreased step length - left;Decreased stance time - right;Trunk flexed Gait velocity: reduced Gait velocity interpretation: Below normal speed for age/gender General Gait Details: VC for upright posture. Placing weight through heel on Rt with post-op shoe in place. Pt reports Dr. Sharol Given said weight allowed through heel only however orders  for NWB. VC for increased use of UEs on RW to remove weight. No loss of balance. UEs fatigue quickly   Stairs Stairs: Yes Stairs assistance: Min assist Stair Management: No rails;Step to pattern;Backwards;With walker Number of Stairs: 2 (x2) General stair comments: Educated on sequencing and technique with backwards approach for stair navigation. Performed x2 with min assist only for walker block. Verbalizes understanding.  Wheelchair Mobility    Modified Rankin (Stroke Patients Only)       Balance                                    Cognition Arousal/Alertness: Awake/alert Behavior During Therapy: WFL for tasks assessed/performed Overall Cognitive Status: Within Functional Limits for tasks assessed                      Exercises      General Comments General comments (skin integrity, edema, etc.): Educated on importance of compliance and elevating RLE      Pertinent Vitals/Pain Pain Assessment: Faces Faces Pain Scale: Hurts little more Pain Location: Rt foot Pain Descriptors / Indicators: Burning Pain Intervention(s): Monitored during session;Repositioned    Home Living                      Prior Function            PT Goals (current goals can now be found in the care plan section) Acute Rehab PT Goals Patient Stated Goal: to get home PT Goal Formulation: With patient Time For Goal Achievement: 05/16/15 Potential  to Achieve Goals: Good Progress towards PT goals: Progressing toward goals    Frequency  Min 3X/week    PT Plan Current plan remains appropriate    Co-evaluation             End of Session Equipment Utilized During Treatment: Gait belt Activity Tolerance: Patient tolerated treatment well Patient left: with call bell/phone within reach;in chair     Time: 1204-1228 PT Time Calculation (min) (ACUTE ONLY): 24 min  Charges:  $Gait Training: 8-22 mins $Therapeutic Activity: 8-22 mins                     G Codes:      Ellouise Newer 05/21/2015, 12:38 PM Camille Bal S[place], [place]

## 2015-05-03 NOTE — Progress Notes (Signed)
Patient Discharge: Disposition: Patient discharged to home with home health ([facility]). Education: Reviewed all her medications, prescriptions, follow-up appointments and discharge instructions, understood and acknowledged. IV: Discontinued IV before discharge. Telemetry: N/A Transportation: Patient transported in w/c with family and staff accompanying out of the unit. Belongings: Patient took all her belongings with her.

## 2015-05-03 NOTE — Care Management Note (Signed)
Case Management Note  Patient Details  Name: [name] MRN: [id number] Date of Birth: 02/19/1951  Subjective/Objective:                  wound Action/Plan: Discharge planning Expected Discharge Date:  04/29/15               Expected Discharge Plan:  [place]  In-House Referral:     Discharge planning Services  CM Consult  Post Acute Care Choice:  Home Health Choice offered to:  Patient  DME Arranged:  3-N-1, Walker rolling DME Agency:  [facility]:  PT, OT, RN [facility] Agency:  [place]  Status of Service:  Completed, signed off  Medicare Important Message Given:  Yes Date Medicare IM Given:    Medicare IM give by:    Date Additional Medicare IM Given:    Additional Medicare Important Message give by:     If discussed at [place] of Stay Meetings, dates discussed:    Additional Comments: CM met with pt in room to offer choice of home health agency.  Pt chooses [facility] to render HHPT/OT/RN.  Referral called to [facility] rep, Tiffany.  CM called [facility] DME rep, Merry Proud to please deliver the rolling walker and 3n1 to room prior to discharge.  No other CM needs were communicated. Dellie Catholic, RN 05/03/2015, 3:47 PM

## 2015-05-03 NOTE — Care Management Important Message (Signed)
Important Message  Patient Details  Name: [name] MRN: [id number] Date of Birth: Jun 14, 1950   Medicare Important Message Given:  Yes    Amalea Ottey P Dorrine Montone 05/03/2015, 10:00 AM

## 2015-05-03 NOTE — Progress Notes (Signed)
Internal Medicine Attending:   I saw and examined the patient. I reviewed the resident's note and I agree with the resident's findings and plan as documented in the resident's note.  Abscess is now treated with surgical drainage, amputation, and bactrim for MSSA on cultures. Plan for 7 day course of antibiotics. We are trying to arrange home health PT and walker for potential discharge today.

## 2015-05-03 NOTE — Progress Notes (Signed)
Internal Medicine Attending:   I saw and examined the patient. I reviewed the resident's note and I agree with the resident's findings and plan as documented in the resident's note.  Surgery went well, pain is controlled. Plan to transition to bactrim for 7 day course following surgical amputation and drainage of foot abscess. PT and OT today for mobilization.

## 2015-05-03 NOTE — Progress Notes (Signed)
Subjective: No acute events overnight. Patient reports pain is controlled. She complains of not having a bowel movement for several days. She states the suppository and Senna did not help. She is ready to work with PT and then go home.   Objective: Vital signs in last 24 hours: Filed Vitals:   05/02/15 1047 05/02/15 1949 05/03/15 0529 05/03/15 0849  BP: 99/60 114/84 106/48 101/66  Pulse: 60 75 70 88  Temp: 98.1 F (36.7 C) 99 F (37.2 C) 98.9 F (37.2 C) 98.7 F (37.1 C)  TempSrc: Oral Oral Oral Oral  Resp: 18 18 18 18   Height:      Weight:  192 lb 14.1 oz (87.491 kg)    SpO2: 98% 98% 94% 98%   Weight change: -1.9 oz (-0.053 kg)  Intake/Output Summary (Last 24 hours) at 05/03/15 1035 Last data filed at 05/03/15 0849  Gross per 24 hour  Intake   1440 ml  Output    600 ml  Net    840 ml   General: sitting up in chair, NAD HEENT: [place]/AT, EOMI, no scleral icterus, mucus membranes moist  CV: RRR, no m/g/r Pulm: CTA bilaterally, breaths non-labored Abd: BS+, soft, nontender, nondistended Ext: Right foot wrapped.  Minimal drainage.  Able to wiggle toes and sensation is intact. Neuro: alert and oriented X 3  Lab Results: Basic Metabolic Panel:  Recent Labs Lab 04/27/15 1655 04/28/15 0615 05/01/15 0844  NA 144 140  --   K 4.6 4.2  --   CL 109 110  --   CO2 25 24  --   GLUCOSE 105* 105*  --   BUN 15 13  --   CREATININE 1.14* 0.82 0.93  CALCIUM 9.5 9.2  --    CBC:  Recent Labs Lab 04/27/15 1655 04/28/15 0615 05/01/15 0844  WBC 7.2 6.2 4.7  NEUTROABS 4.7  --  2.8  HGB 11.9* 10.7* 10.7*  HCT 37.9 33.7* 34.6*  MCV 100.0 99.1 100.3*  PLT 200 167 164   CBG:  Recent Labs Lab 05/01/15 1958 05/02/15 0753 05/02/15 1148 05/02/15 1656 05/02/15 2054 05/03/15 0737  GLUCAP 195* 117* 165* 165* 117* 128*   Urine Drug Screen: Drugs of Abuse     Component Value Date/Time   LABOPIA NONE DETECTED 03/09/2013 2346   COCAINSCRNUR NONE DETECTED 03/09/2013 2346   LABBENZ NONE DETECTED 03/09/2013 2346   AMPHETMU NONE DETECTED 03/09/2013 2346   THCU NONE DETECTED 03/09/2013 2346   LABBARB NONE DETECTED 03/09/2013 2346     Micro Results: Recent Results (from the past 240 hour(s))  Wound culture     Status: None   Collection Time: 04/27/15  7:21 PM  Result Value Ref Range Status   Specimen Description WOUND RIGHT FOOT  Final   Special Requests NONE  Final   Gram Stain   Final    NO WBC SEEN NO SQUAMOUS EPITHELIAL CELLS SEEN MODERATE GRAM POSITIVE COCCI IN PAIRS Performed at [facility]    Culture   Final    ABUNDANT STAPHYLOCOCCUS AUREUS Note: RIFAMPIN AND GENTAMICIN SHOULD NOT BE USED AS SINGLE DRUGS FOR TREATMENT OF STAPH INFECTIONS. Performed at [facility]    Report Status 05/01/2015 FINAL  Final   Organism ID, Bacteria STAPHYLOCOCCUS AUREUS  Final      Susceptibility   Staphylococcus aureus - MIC*    CLINDAMYCIN >=8 RESISTANT Resistant     ERYTHROMYCIN 0.5 SENSITIVE Sensitive     GENTAMICIN <=0.5 SENSITIVE Sensitive  LEVOFLOXACIN 0.25 SENSITIVE Sensitive     OXACILLIN 0.5 SENSITIVE Sensitive     RIFAMPIN <=0.5 SENSITIVE Sensitive     TRIMETH/SULFA <=10 SENSITIVE Sensitive     VANCOMYCIN 1 SENSITIVE Sensitive     TETRACYCLINE <=1 SENSITIVE Sensitive     MOXIFLOXACIN <=0.25 SENSITIVE Sensitive     * ABUNDANT STAPHYLOCOCCUS AUREUS  Blood Cultures x 2 sites     Status: None   Collection Time: 04/27/15  7:56 PM  Result Value Ref Range Status   Specimen Description BLOOD RIGHT ANTECUBITAL  Final   Special Requests BOTTLES DRAWN AEROBIC ONLY 6CC  Final   Culture NO GROWTH 5 DAYS  Final   Report Status 05/02/2015 FINAL  Final  Blood Cultures x 2 sites     Status: None   Collection Time: 04/27/15  8:06 PM  Result Value Ref Range Status   Specimen Description BLOOD LEFT ANTECUBITAL  Final   Special Requests BOTTLES DRAWN AEROBIC ONLY 6CC  Final   Culture NO GROWTH 5 DAYS  Final   Report Status 05/02/2015 FINAL   Final  Surgical pcr screen     Status: Abnormal   Collection Time: 05/01/15  1:42 PM  Result Value Ref Range Status   MRSA, PCR NEGATIVE NEGATIVE Final   Staphylococcus aureus POSITIVE (A) NEGATIVE Final    Comment:        The Xpert SA Assay (FDA approved for NASAL specimens in patients over 43 years of age), is one component of a comprehensive surveillance program.  Test performance has been validated by [facility] for patients greater than or equal to 61 year old. It is not intended to diagnose infection nor to guide or monitor treatment.    Medications:  Scheduled Meds: . aspirin  81 mg Oral q morning - 10a  . butalbital-acetaminophen-caffeine  1 tablet Oral QHS  . DULoxetine  60 mg Oral QAC breakfast  . enoxaparin (LOVENOX) injection  40 mg Subcutaneous Q24H  . feeding supplement (GLUCERNA SHAKE)  237 mL Oral TID BM  . gabapentin  1,200 mg Oral TID  . insulin aspart  0-15 Units Subcutaneous TID WC  . insulin aspart  0-5 Units Subcutaneous QHS  . insulin aspart  4 Units Subcutaneous TID WC  . mupirocin ointment  1 application Nasal BID  . pantoprazole  20 mg Oral Daily  . rosuvastatin  40 mg Oral Daily  . senna-docusate  2 tablet Oral BID  . sulfamethoxazole-trimethoprim  1 tablet Oral Q12H   Continuous Infusions: . sodium chloride    . lactated ringers 10 mL/hr at 05/01/15 1612   PRN Meds:.acetaminophen **OR** acetaminophen, albuterol, HYDROcodone-acetaminophen, HYDROcodone-acetaminophen, HYDROmorphone (DILAUDID) injection, methocarbamol **OR** methocarbamol (ROBAXIN)  IV, metoCLOPramide **OR** metoCLOPramide (REGLAN) injection, ondansetron **OR** ondansetron (ZOFRAN) IV, oxyCODONE, promethazine Assessment/Plan:  Diabetic Foot Ulcer of Right Foot: MRI with evidence of abscess. 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 Blood cx with NGTD. Wound cx grew Staph aureus with resistance only to clinda.  - Discharge home today if does well with PT - Will  send home on Bactrim 800-160 mg Q12H for additional 7 days (end date: through 05/10/15) - PT recommending rolling walker and home health PT, order upon discharge  - Increase Senna to 2 tablets BID for opioid-induced constipation   Type 2 DM: Home meds are Glipizide, Metformin, Sitagliptin. Last A1C 7.0 in Sept 2016 - Continue moderate ISS  - Continue Novolog 4 units TID with meals - Monitor CBGs - Continue Gabapentin 1200mg  TID  HTN: Home meds are Lisinopril and Amlodipine - Stable off home meds - Continue to monitor BP  HLD: Home meds are Rosuvastatin and ASA  - Continue Rosuvastatin and ASA 81mg   Depression/Anxiety - Continue Duloxetine 60mg  daily  Chronic Headaches - Continue Fioricet QHS  COPD (no PFTs on file) - Albuterol nebs q6h prn  GERD - Continue Pantoprazole 20mg  daily   Diet: Carb modified  DVT PPx: Lovenox SQ Code: Full Dispo: Discharge home likely today. Will have follow up in [facility].   The patient does have a current PCP (Rushil Sherrye Payor, MD) and does not need an [facility] hospital follow-up appointment after discharge.  The patient does not have transportation limitations that hinder transportation to clinic appointments.   LOS: 6 days   Albin Felling, MD, MPH Internal Medicine Resident, PGY-II Pager: [phone] 05/03/2015, 10:35 AM

## 2015-05-04 ENCOUNTER — Encounter (HOSPITAL_COMMUNITY): Payer: Self-pay | Admitting: Orthopedic Surgery

## 2015-05-04 DIAGNOSIS — Z4781 Encounter for orthopedic aftercare following surgical amputation: Secondary | ICD-10-CM | POA: Diagnosis not present

## 2015-05-04 DIAGNOSIS — K219 Gastro-esophageal reflux disease without esophagitis: Secondary | ICD-10-CM | POA: Diagnosis not present

## 2015-05-04 DIAGNOSIS — Z9181 History of falling: Secondary | ICD-10-CM | POA: Diagnosis not present

## 2015-05-04 DIAGNOSIS — Z89411 Acquired absence of right great toe: Secondary | ICD-10-CM | POA: Diagnosis not present

## 2015-05-04 DIAGNOSIS — F17210 Nicotine dependence, cigarettes, uncomplicated: Secondary | ICD-10-CM | POA: Diagnosis not present

## 2015-05-04 DIAGNOSIS — E785 Hyperlipidemia, unspecified: Secondary | ICD-10-CM | POA: Diagnosis not present

## 2015-05-04 DIAGNOSIS — J449 Chronic obstructive pulmonary disease, unspecified: Secondary | ICD-10-CM | POA: Diagnosis not present

## 2015-05-04 DIAGNOSIS — E1165 Type 2 diabetes mellitus with hyperglycemia: Secondary | ICD-10-CM | POA: Diagnosis not present

## 2015-05-04 DIAGNOSIS — F419 Anxiety disorder, unspecified: Secondary | ICD-10-CM | POA: Diagnosis not present

## 2015-05-04 DIAGNOSIS — F339 Major depressive disorder, recurrent, unspecified: Secondary | ICD-10-CM | POA: Diagnosis not present

## 2015-05-04 DIAGNOSIS — Z7982 Long term (current) use of aspirin: Secondary | ICD-10-CM | POA: Diagnosis not present

## 2015-05-04 DIAGNOSIS — G8929 Other chronic pain: Secondary | ICD-10-CM | POA: Diagnosis not present

## 2015-05-04 DIAGNOSIS — Z7984 Long term (current) use of oral hypoglycemic drugs: Secondary | ICD-10-CM | POA: Diagnosis not present

## 2015-05-04 DIAGNOSIS — I10 Essential (primary) hypertension: Secondary | ICD-10-CM | POA: Diagnosis not present

## 2015-05-04 DIAGNOSIS — E1140 Type 2 diabetes mellitus with diabetic neuropathy, unspecified: Secondary | ICD-10-CM | POA: Diagnosis not present

## 2015-05-04 LAB — GLUCOSE, CAPILLARY: Glucose-Capillary: 88 mg/dL (ref 65–99)

## 2015-05-05 ENCOUNTER — Encounter: Payer: Self-pay | Admitting: Student

## 2015-05-05 DIAGNOSIS — F339 Major depressive disorder, recurrent, unspecified: Secondary | ICD-10-CM | POA: Diagnosis not present

## 2015-05-05 DIAGNOSIS — J449 Chronic obstructive pulmonary disease, unspecified: Secondary | ICD-10-CM | POA: Diagnosis not present

## 2015-05-05 DIAGNOSIS — Z4781 Encounter for orthopedic aftercare following surgical amputation: Secondary | ICD-10-CM | POA: Diagnosis not present

## 2015-05-05 DIAGNOSIS — I10 Essential (primary) hypertension: Secondary | ICD-10-CM | POA: Diagnosis not present

## 2015-05-05 DIAGNOSIS — E1140 Type 2 diabetes mellitus with diabetic neuropathy, unspecified: Secondary | ICD-10-CM | POA: Diagnosis not present

## 2015-05-05 DIAGNOSIS — E1165 Type 2 diabetes mellitus with hyperglycemia: Secondary | ICD-10-CM | POA: Diagnosis not present

## 2015-05-06 DIAGNOSIS — I10 Essential (primary) hypertension: Secondary | ICD-10-CM | POA: Diagnosis not present

## 2015-05-06 DIAGNOSIS — E1140 Type 2 diabetes mellitus with diabetic neuropathy, unspecified: Secondary | ICD-10-CM | POA: Diagnosis not present

## 2015-05-06 DIAGNOSIS — J449 Chronic obstructive pulmonary disease, unspecified: Secondary | ICD-10-CM | POA: Diagnosis not present

## 2015-05-06 DIAGNOSIS — F339 Major depressive disorder, recurrent, unspecified: Secondary | ICD-10-CM | POA: Diagnosis not present

## 2015-05-06 DIAGNOSIS — Z4781 Encounter for orthopedic aftercare following surgical amputation: Secondary | ICD-10-CM | POA: Diagnosis not present

## 2015-05-06 DIAGNOSIS — E1165 Type 2 diabetes mellitus with hyperglycemia: Secondary | ICD-10-CM | POA: Diagnosis not present

## 2015-05-11 ENCOUNTER — Encounter: Payer: Self-pay | Admitting: Internal Medicine

## 2015-05-11 ENCOUNTER — Ambulatory Visit (INDEPENDENT_AMBULATORY_CARE_PROVIDER_SITE_OTHER): Payer: Medicare Other | Admitting: Internal Medicine

## 2015-05-11 VITALS — BP 145/80 | HR 92 | Temp 98.0°F | Ht 66.5 in | Wt 195.5 lb

## 2015-05-11 DIAGNOSIS — R5382 Chronic fatigue, unspecified: Secondary | ICD-10-CM

## 2015-05-11 DIAGNOSIS — E11621 Type 2 diabetes mellitus with foot ulcer: Secondary | ICD-10-CM | POA: Diagnosis not present

## 2015-05-11 DIAGNOSIS — R5383 Other fatigue: Secondary | ICD-10-CM | POA: Insufficient documentation

## 2015-05-11 DIAGNOSIS — E1142 Type 2 diabetes mellitus with diabetic polyneuropathy: Secondary | ICD-10-CM

## 2015-05-11 DIAGNOSIS — I10 Essential (primary) hypertension: Secondary | ICD-10-CM | POA: Diagnosis not present

## 2015-05-11 DIAGNOSIS — F330 Major depressive disorder, recurrent, mild: Secondary | ICD-10-CM | POA: Diagnosis not present

## 2015-05-11 DIAGNOSIS — L97502 Non-pressure chronic ulcer of other part of unspecified foot with fat layer exposed: Secondary | ICD-10-CM

## 2015-05-11 DIAGNOSIS — Z79899 Other long term (current) drug therapy: Secondary | ICD-10-CM | POA: Diagnosis not present

## 2015-05-11 DIAGNOSIS — D62 Acute posthemorrhagic anemia: Secondary | ICD-10-CM | POA: Diagnosis not present

## 2015-05-11 MED ORDER — DULOXETINE HCL 60 MG PO CPEP: 60.0000 mg | ORAL_CAPSULE | ORAL | Status: DC

## 2015-05-11 MED ORDER — AMLODIPINE BESYLATE 5 MG PO TABS: 5.0000 mg | ORAL_TABLET | Freq: Every day | ORAL | Status: DC

## 2015-05-11 NOTE — Assessment & Plan Note (Addendum)
BP Readings from Last 3 Encounters:  05/11/15 145/80  05/03/15 101/66  04/27/15 130/74    Lab Results  Component Value Date   NA 140 04/28/2015   K 4.2 04/28/2015   CREATININE 0.93 05/01/2015    Assessment: Blood pressure control:  Slightly above goal Progress toward BP goal:   Deteriorated Comments: Patient is only on lisinopril 40 mg daily as her amlodipine 5 mg daily was held on discharge due to soft BPs. Patient believes stopping amlodipine has caused her fatigue.  Plan: Medications: Restart amlodipine 5 mg daily, continue lisinopril 40 mg daily.

## 2015-05-11 NOTE — Assessment & Plan Note (Addendum)
Patient complains of increased fatigue since her surgery on 05/01/15. She admits to poor sleep at night due to her one year old great grandchild, but this isn't new for her. Other changes other than her surgery include being off of amlodipine which she contributes her fatigue to and being out of her Cymbalta. Etiology includes depression (out of her Cymbalta 60 mg), anemia from blood loss (Hemoglobin 14.9>12.2>10.7 this year after surgery, last colonoscopy 5 years ago and normal per patient), medication effects (narcotics, muscle relaxers), vitamin D deficiency (vitamin D level- 23), or due to inadequate sleep. Doubt hypothyroidism as TSH was normal in January 2015.  Plan: -Restart amlodipine 5 mg daily -Add vitamin D supplementation 600-800 per day -For anemia- check CBC, ferritin, iron levels -Restart Cymbalta 60 mg daily

## 2015-05-11 NOTE — Assessment & Plan Note (Addendum)
Patient was recently admitted to the hospital from 11/14 to 11/20 for a diabetic foot ulcer that did not respond to 3 days of clindamycin. MRI of the foot revealed abscess without osteomyelitis. Orthopedic surgery was consulted who recommended amputation of first ray which was completed on 05/01/15. Patient was started on Bactrim DS BID for a 10 day course. Antibiotics were supposed to be completed on 05/10/15; however, patient states there were 28 pills in the bottle. Discharge rx and paperwork indicate a 10 day course, not 14. Patient denies fever, chills, pain. She has follow up with Dr. Sharol Given today at 3 pm.  Plan: -Follow up with Dr. Sharol Given -Stop antibiotics

## 2015-05-11 NOTE — Patient Instructions (Signed)
FOR YOUR BLOOD PRESSURE: -TAKE AMLODIPINE 5 MG DAILY -TAKE LISINOPRIL 40 MG DAILY  FOR YOUR FATIGUE: -RESTART YOUR CYMBALTA 60 MG DAILY -TAKE VITAMIN D 600 TO 800 PER DAY -WE WILL CHECK YOUR BLOOD LEVELS TODAY  FOR YOUR TOE: -YOU CAN STOP TAKING THE BACTRIM AS YOU HAVE COMPLETED A 10 DAY COURSE OF DOUBLE-STRENGTH BACTRIM 800-160 TWICE DAILY. -FOLLOW UP WITH DR. Sharol Given

## 2015-05-11 NOTE — Progress Notes (Signed)
Subjective:    Patient ID: [name], female    DOB: 1950/08/03, 64 y.o.   MRN: [id number]  HPI Gabrialle Divito is a 64 y.o. female with PMHx of HTN, T2DM, GERD, Depression, chronic diabetic foot ulcer who presents to the clinic for fatigue. Please see A&P for the status of the patient's chronic medical problems.   Past Medical History  Diagnosis Date  . Hypertension   . Diabetes mellitus without complication ([place])     diagnosed around 2010, only ever on metformin  . Chronic pain     neck pain, headache, neuropathy  . Hypertriglyceridemia   . Neuromuscular disorder ([place])   . COPD (chronic obstructive pulmonary disease) ([place])   . Depression   . Puncture wound of foot, right 05/17/2012    Tetanus shot 3 yrs ago at [facility] in [place], per pt report   . Brachial plexus disorders     Outpatient Encounter Prescriptions as of 05/11/2015  Medication Sig  . albuterol (PROVENTIL HFA;VENTOLIN HFA) 108 (90 BASE) MCG/ACT inhaler Inhale 1-2 puffs into the lungs every 6 (six) hours as needed for wheezing or shortness of breath.  Marland Kitchen amLODipine (NORVASC) 5 MG tablet Take 1 tablet (5 mg total) by mouth daily.  Marland Kitchen aspirin 81 MG tablet Take 81 mg by mouth every morning.   . butalbital-acetaminophen-caffeine (FIORICET, ESGIC) 50-325-40 MG tablet Take 1 tablet by mouth at bedtime.  . DULoxetine (CYMBALTA) 60 MG capsule Take 1 capsule (60 mg total) by mouth every morning.  . gabapentin (NEURONTIN) 600 MG tablet Take 2 tablets (1,200 mg total) by mouth 3 (three) times daily.  Marland Kitchen glipiZIDE (GLUCOTROL XL) 10 MG 24 hr tablet Take 1 tablet (10 mg total) by mouth daily with supper.  Marland Kitchen lisinopril (PRINIVIL,ZESTRIL) 40 MG tablet take 1 tablet by mouth once daily (Patient taking differently: take 40 mg by mouth once daily)  . metFORMIN (GLUCOPHAGE-XR) 750 MG 24 hr tablet Take 2 tablets (1,500 mg total) by mouth daily with breakfast.  . methocarbamol (ROBAXIN) 500 MG tablet Take 1 tablet (500 mg total) by  mouth every 6 (six) hours as needed for muscle spasms.  Marland Kitchen oxyCODONE (ROXICODONE) 15 MG immediate release tablet Take 1 tablet (15 mg total) by mouth every 4 (four) hours as needed for pain.  . pantoprazole (PROTONIX) 20 MG tablet Take 1 tablet (20 mg total) by mouth daily.  . rosuvastatin (CRESTOR) 40 MG tablet Take 1 tablet (40 mg total) by mouth daily.  Marland Kitchen senna-docusate (SENOKOT-S) 8.6-50 MG tablet Take 2 tablets by mouth 2 (two) times daily.  . sitaGLIPtin (JANUVIA) 100 MG tablet Take 1 tablet (100 mg total) by mouth daily. (Patient taking differently: Take 100 mg by mouth at bedtime. )  . sulfamethoxazole-trimethoprim (BACTRIM DS,SEPTRA DS) 800-160 MG tablet Take 1 tablet by mouth every 12 (twelve) hours.  . triamcinolone ([employer] NASAL ALLERGY SPRAY) 55 MCG/ACT AERO nasal inhaler Place 2 sprays into the nose daily as needed (allergies).  . [DISCONTINUED] DULoxetine (CYMBALTA) 60 MG capsule Take 1 capsule (60 mg total) by mouth every morning.   No facility-administered encounter medications on file as of 05/11/2015.    Family History  Problem Relation Age of Onset  . Hyperlipidemia Mother   . Heart attack Father 76  . Hypertension Father   . Cancer Paternal Grandfather     Lung cancer  . Cancer Maternal Grandmother   . Heart attack Maternal Grandmother     Social History   Social History  . Marital  Status: Widowed    Spouse Name: N/A  . Number of Children: N/A  . Years of Education: 14   Occupational History  . unemployed    Social History Main Topics  . Smoking status: Current Every Day Smoker -- 0.00 packs/day for 41 years    Types: Cigarettes  . Smokeless tobacco: Never Used     Comment: Trying to cut back. 1/2PPD OR LESS  . Alcohol Use: No  . Drug Use: No  . Sexual Activity: Not on file   Other Topics Concern  . Not on file   Social History Narrative   Moved to [facility] from [place], shortly after the death of her husband, to live with her daughter.  She  notes significant life stress related to her 68 year-old grand-daughter with bipolar disorder.  She has previously worked as a Occupational psychologist.    Review of Systems General: Admits to fatigue. Denies fever, chills, and diaphoresis.  Respiratory: Denies SOB, DOE.   Cardiovascular: Denies chest pain and palpitations.  Gastrointestinal: Denies nausea, vomiting, diarrhea, blood in stool.  Musculoskeletal: Denies myalgias.  Skin: Denies pallor. Neurological: Denies dizziness, headaches, weakness, lightheadedness Psychiatric/Behavioral: Denies mood changes, confusion, nervousness, sleep disturbance and agitation.     Objective:   Physical Exam Filed Vitals:   05/11/15 1403  BP: 145/80  Pulse: 92  Temp: 98 F (36.7 C)  TempSrc: Oral  Height: 5' 6.5" (1.689 m)  Weight: 195 lb 8 oz (88.678 kg)  SpO2: 99%   General: Vital signs reviewed.  Patient is elderly, obese, in no acute distress and cooperative with exam.  Cardiovascular: RRR, S1 normal, S2 normal Pulmonary/Chest: Clear to auscultation bilaterally, no wheezes, rales, or rhonchi. Abdominal: Soft, non-tender, non-distended, BS +.  Extremities: Right foot well wrapped, able to wiggle toes, normal sensation.  Skin: Warm, dry and intact. Psychiatric: Normal mood and affect. speech and behavior is normal. Cognition and memory are normal.     Assessment & Plan:   Please see problem based assessment and plan.

## 2015-05-12 ENCOUNTER — Other Ambulatory Visit: Payer: Self-pay | Admitting: Internal Medicine

## 2015-05-12 DIAGNOSIS — Z4781 Encounter for orthopedic aftercare following surgical amputation: Secondary | ICD-10-CM | POA: Diagnosis not present

## 2015-05-12 DIAGNOSIS — J449 Chronic obstructive pulmonary disease, unspecified: Secondary | ICD-10-CM | POA: Diagnosis not present

## 2015-05-12 DIAGNOSIS — E1140 Type 2 diabetes mellitus with diabetic neuropathy, unspecified: Secondary | ICD-10-CM | POA: Diagnosis not present

## 2015-05-12 DIAGNOSIS — E1165 Type 2 diabetes mellitus with hyperglycemia: Secondary | ICD-10-CM | POA: Diagnosis not present

## 2015-05-12 DIAGNOSIS — F339 Major depressive disorder, recurrent, unspecified: Secondary | ICD-10-CM | POA: Diagnosis not present

## 2015-05-12 DIAGNOSIS — I10 Essential (primary) hypertension: Secondary | ICD-10-CM | POA: Diagnosis not present

## 2015-05-12 LAB — FERRITIN: Ferritin: 30 ng/mL (ref 15–150)

## 2015-05-12 LAB — CBC
HEMATOCRIT: 34.6 % (ref 34.0–46.6)
HEMOGLOBIN: 11.1 g/dL (ref 11.1–15.9)
MCH: 30.5 pg (ref 26.6–33.0)
MCHC: 32.1 g/dL (ref 31.5–35.7)
MCV: 95 fL (ref 79–97)
Platelets: 298 10*3/uL (ref 150–379)
RBC: 3.64 x10E6/uL — AB (ref 3.77–5.28)
RDW: 14.7 % (ref 12.3–15.4)
WBC: 9.2 10*3/uL (ref 3.4–10.8)

## 2015-05-12 LAB — IRON: Iron: 44 ug/dL (ref 27–139)

## 2015-05-13 ENCOUNTER — Inpatient Hospital Stay (HOSPITAL_COMMUNITY)
Admission: RE | Admit: 2015-05-13 | Discharge: 2015-05-13 | Disposition: A | Discharge status: Home / Self Care | Payer: Medicare Other | Source: Ambulatory Visit

## 2015-05-13 DIAGNOSIS — J449 Chronic obstructive pulmonary disease, unspecified: Secondary | ICD-10-CM | POA: Diagnosis not present

## 2015-05-13 DIAGNOSIS — Z4781 Encounter for orthopedic aftercare following surgical amputation: Secondary | ICD-10-CM | POA: Diagnosis not present

## 2015-05-13 DIAGNOSIS — F339 Major depressive disorder, recurrent, unspecified: Secondary | ICD-10-CM | POA: Diagnosis not present

## 2015-05-13 DIAGNOSIS — E1165 Type 2 diabetes mellitus with hyperglycemia: Secondary | ICD-10-CM | POA: Diagnosis not present

## 2015-05-13 DIAGNOSIS — I10 Essential (primary) hypertension: Secondary | ICD-10-CM | POA: Diagnosis not present

## 2015-05-13 DIAGNOSIS — E1140 Type 2 diabetes mellitus with diabetic neuropathy, unspecified: Secondary | ICD-10-CM | POA: Diagnosis not present

## 2015-05-13 NOTE — Telephone Encounter (Signed)
As this patient was seen by me on 03/19/15 and deemed necessary for their treatment as documented in the EHR, I will refill this prescription for Fioricet.

## 2015-05-13 NOTE — Progress Notes (Signed)
Internal Medicine Clinic Attending  Case discussed with Dr. Richardson at the time of the visit.  We reviewed the resident's history and exam and pertinent patient test results.  I agree with the assessment, diagnosis, and plan of care documented in the resident's note. 

## 2015-05-13 NOTE — Telephone Encounter (Signed)
Called to pharm 

## 2015-05-14 DIAGNOSIS — I10 Essential (primary) hypertension: Secondary | ICD-10-CM | POA: Diagnosis not present

## 2015-05-14 DIAGNOSIS — Z4781 Encounter for orthopedic aftercare following surgical amputation: Secondary | ICD-10-CM | POA: Diagnosis not present

## 2015-05-14 DIAGNOSIS — E1165 Type 2 diabetes mellitus with hyperglycemia: Secondary | ICD-10-CM | POA: Diagnosis not present

## 2015-05-14 DIAGNOSIS — F339 Major depressive disorder, recurrent, unspecified: Secondary | ICD-10-CM | POA: Diagnosis not present

## 2015-05-14 DIAGNOSIS — J449 Chronic obstructive pulmonary disease, unspecified: Secondary | ICD-10-CM | POA: Diagnosis not present

## 2015-05-14 DIAGNOSIS — E1140 Type 2 diabetes mellitus with diabetic neuropathy, unspecified: Secondary | ICD-10-CM | POA: Diagnosis not present

## 2015-05-18 ENCOUNTER — Ambulatory Visit: Payer: Medicare Other | Admitting: Internal Medicine

## 2015-05-19 DIAGNOSIS — F339 Major depressive disorder, recurrent, unspecified: Secondary | ICD-10-CM | POA: Diagnosis not present

## 2015-05-19 DIAGNOSIS — Z4781 Encounter for orthopedic aftercare following surgical amputation: Secondary | ICD-10-CM | POA: Diagnosis not present

## 2015-05-19 DIAGNOSIS — I10 Essential (primary) hypertension: Secondary | ICD-10-CM | POA: Diagnosis not present

## 2015-05-19 DIAGNOSIS — J449 Chronic obstructive pulmonary disease, unspecified: Secondary | ICD-10-CM | POA: Diagnosis not present

## 2015-05-19 DIAGNOSIS — E1140 Type 2 diabetes mellitus with diabetic neuropathy, unspecified: Secondary | ICD-10-CM | POA: Diagnosis not present

## 2015-05-19 DIAGNOSIS — E1165 Type 2 diabetes mellitus with hyperglycemia: Secondary | ICD-10-CM | POA: Diagnosis not present

## 2015-05-20 DIAGNOSIS — Z4781 Encounter for orthopedic aftercare following surgical amputation: Secondary | ICD-10-CM | POA: Diagnosis not present

## 2015-05-20 DIAGNOSIS — J449 Chronic obstructive pulmonary disease, unspecified: Secondary | ICD-10-CM | POA: Diagnosis not present

## 2015-05-20 DIAGNOSIS — E1140 Type 2 diabetes mellitus with diabetic neuropathy, unspecified: Secondary | ICD-10-CM | POA: Diagnosis not present

## 2015-05-20 DIAGNOSIS — E1165 Type 2 diabetes mellitus with hyperglycemia: Secondary | ICD-10-CM | POA: Diagnosis not present

## 2015-05-20 DIAGNOSIS — F339 Major depressive disorder, recurrent, unspecified: Secondary | ICD-10-CM | POA: Diagnosis not present

## 2015-05-20 DIAGNOSIS — I10 Essential (primary) hypertension: Secondary | ICD-10-CM | POA: Diagnosis not present

## 2015-05-21 DIAGNOSIS — E1165 Type 2 diabetes mellitus with hyperglycemia: Secondary | ICD-10-CM | POA: Diagnosis not present

## 2015-05-21 DIAGNOSIS — E1140 Type 2 diabetes mellitus with diabetic neuropathy, unspecified: Secondary | ICD-10-CM | POA: Diagnosis not present

## 2015-05-21 DIAGNOSIS — I10 Essential (primary) hypertension: Secondary | ICD-10-CM | POA: Diagnosis not present

## 2015-05-21 DIAGNOSIS — Z4781 Encounter for orthopedic aftercare following surgical amputation: Secondary | ICD-10-CM | POA: Diagnosis not present

## 2015-05-21 DIAGNOSIS — F339 Major depressive disorder, recurrent, unspecified: Secondary | ICD-10-CM | POA: Diagnosis not present

## 2015-05-21 DIAGNOSIS — J449 Chronic obstructive pulmonary disease, unspecified: Secondary | ICD-10-CM | POA: Diagnosis not present

## 2015-05-22 ENCOUNTER — Inpatient Hospital Stay (HOSPITAL_COMMUNITY)
Admission: EM | Admit: 2015-05-22 | Discharge: 2015-05-26 | DRG: 863 | Disposition: A | Discharge status: Home Health | Payer: Medicare Other | Attending: Internal Medicine | Admitting: Internal Medicine

## 2015-05-22 ENCOUNTER — Inpatient Hospital Stay: Admit: 2015-05-22 | Payer: Medicare Other | Admitting: Orthopedic Surgery

## 2015-05-22 ENCOUNTER — Encounter (HOSPITAL_COMMUNITY): Payer: Self-pay | Admitting: Emergency Medicine

## 2015-05-22 ENCOUNTER — Emergency Department (HOSPITAL_COMMUNITY): Payer: Medicare Other

## 2015-05-22 DIAGNOSIS — R51 Headache: Secondary | ICD-10-CM | POA: Diagnosis present

## 2015-05-22 DIAGNOSIS — Z89411 Acquired absence of right great toe: Secondary | ICD-10-CM | POA: Diagnosis not present

## 2015-05-22 DIAGNOSIS — K219 Gastro-esophageal reflux disease without esophagitis: Secondary | ICD-10-CM | POA: Diagnosis present

## 2015-05-22 DIAGNOSIS — E1140 Type 2 diabetes mellitus with diabetic neuropathy, unspecified: Secondary | ICD-10-CM | POA: Diagnosis present

## 2015-05-22 DIAGNOSIS — E11621 Type 2 diabetes mellitus with foot ulcer: Secondary | ICD-10-CM | POA: Diagnosis present

## 2015-05-22 DIAGNOSIS — T814XXA Infection following a procedure, initial encounter: Principal | ICD-10-CM | POA: Diagnosis present

## 2015-05-22 DIAGNOSIS — Z888 Allergy status to other drugs, medicaments and biological substances status: Secondary | ICD-10-CM

## 2015-05-22 DIAGNOSIS — L02619 Cutaneous abscess of unspecified foot: Secondary | ICD-10-CM

## 2015-05-22 DIAGNOSIS — L089 Local infection of the skin and subcutaneous tissue, unspecified: Secondary | ICD-10-CM | POA: Diagnosis present

## 2015-05-22 DIAGNOSIS — Z8249 Family history of ischemic heart disease and other diseases of the circulatory system: Secondary | ICD-10-CM

## 2015-05-22 DIAGNOSIS — E1169 Type 2 diabetes mellitus with other specified complication: Secondary | ICD-10-CM | POA: Diagnosis present

## 2015-05-22 DIAGNOSIS — E669 Obesity, unspecified: Secondary | ICD-10-CM | POA: Diagnosis present

## 2015-05-22 DIAGNOSIS — F329 Major depressive disorder, single episode, unspecified: Secondary | ICD-10-CM | POA: Diagnosis present

## 2015-05-22 DIAGNOSIS — Z7984 Long term (current) use of oral hypoglycemic drugs: Secondary | ICD-10-CM

## 2015-05-22 DIAGNOSIS — E1165 Type 2 diabetes mellitus with hyperglycemia: Secondary | ICD-10-CM | POA: Diagnosis present

## 2015-05-22 DIAGNOSIS — E118 Type 2 diabetes mellitus with unspecified complications: Secondary | ICD-10-CM | POA: Diagnosis present

## 2015-05-22 DIAGNOSIS — I10 Essential (primary) hypertension: Secondary | ICD-10-CM | POA: Diagnosis not present

## 2015-05-22 DIAGNOSIS — Z87891 Personal history of nicotine dependence: Secondary | ICD-10-CM

## 2015-05-22 DIAGNOSIS — Z6831 Body mass index (BMI) 31.0-31.9, adult: Secondary | ICD-10-CM

## 2015-05-22 DIAGNOSIS — M542 Cervicalgia: Secondary | ICD-10-CM | POA: Diagnosis present

## 2015-05-22 DIAGNOSIS — M79671 Pain in right foot: Secondary | ICD-10-CM | POA: Diagnosis not present

## 2015-05-22 DIAGNOSIS — G709 Myoneural disorder, unspecified: Secondary | ICD-10-CM | POA: Diagnosis present

## 2015-05-22 DIAGNOSIS — Z7982 Long term (current) use of aspirin: Secondary | ICD-10-CM

## 2015-05-22 DIAGNOSIS — Y838 Other surgical procedures as the cause of abnormal reaction of the patient, or of later complication, without mention of misadventure at the time of the procedure: Secondary | ICD-10-CM | POA: Diagnosis present

## 2015-05-22 DIAGNOSIS — G8929 Other chronic pain: Secondary | ICD-10-CM | POA: Diagnosis not present

## 2015-05-22 DIAGNOSIS — L97519 Non-pressure chronic ulcer of other part of right foot with unspecified severity: Secondary | ICD-10-CM | POA: Diagnosis present

## 2015-05-22 DIAGNOSIS — L02415 Cutaneous abscess of right lower limb: Secondary | ICD-10-CM | POA: Diagnosis not present

## 2015-05-22 DIAGNOSIS — E785 Hyperlipidemia, unspecified: Secondary | ICD-10-CM

## 2015-05-22 DIAGNOSIS — Z9049 Acquired absence of other specified parts of digestive tract: Secondary | ICD-10-CM

## 2015-05-22 DIAGNOSIS — J449 Chronic obstructive pulmonary disease, unspecified: Secondary | ICD-10-CM | POA: Diagnosis present

## 2015-05-22 DIAGNOSIS — Z79899 Other long term (current) drug therapy: Secondary | ICD-10-CM

## 2015-05-22 DIAGNOSIS — Z90710 Acquired absence of both cervix and uterus: Secondary | ICD-10-CM

## 2015-05-22 LAB — COMPREHENSIVE METABOLIC PANEL
ALT: 29 U/L (ref 14–54)
AST: 22 U/L (ref 15–41)
Albumin: 3.7 g/dL (ref 3.5–5.0)
Alkaline Phosphatase: 59 U/L (ref 38–126)
Anion gap: 7 (ref 5–15)
BUN: 16 mg/dL (ref 6–20)
CO2: 25 mmol/L (ref 22–32)
Calcium: 9.8 mg/dL (ref 8.9–10.3)
Chloride: 107 mmol/L (ref 101–111)
Creatinine, Ser: 0.8 mg/dL (ref 0.44–1.00)
GFR calc Af Amer: >60 mL/min (ref >60)
GFR calc non Af Amer: >60 mL/min (ref >60)
Glucose, Bld: 79 mg/dL (ref 65–99)
Potassium: 4.7 mmol/L (ref 3.5–5.1)
Sodium: 139 mmol/L (ref 135–145)
Total Bilirubin: 0.8 mg/dL (ref 0.3–1.2)
Total Protein: 6.8 g/dL (ref 6.5–8.1)

## 2015-05-22 LAB — I-STAT CG4 LACTIC ACID, ED
LACTIC ACID, VENOUS: 1.66 mmol/L (ref 0.5–2.0)
Lactic Acid, Venous: 1.04 mmol/L (ref 0.5–2.0)

## 2015-05-22 LAB — CBC
HCT: 39.2 % (ref 36.0–46.0)
Hemoglobin: 12.1 g/dL (ref 12.0–15.0)
MCH: 30.3 pg (ref 26.0–34.0)
MCHC: 30.9 g/dL (ref 30.0–36.0)
MCV: 98.2 fL (ref 78.0–100.0)
Platelets: 216 10*3/uL (ref 150–400)
RBC: 3.99 MIL/uL (ref 3.87–5.11)
RDW: 14 % (ref 11.5–15.5)
WBC: 8.1 10*3/uL (ref 4.0–10.5)

## 2015-05-22 LAB — GLUCOSE, CAPILLARY: Glucose-Capillary: 104 mg/dL — ABNORMAL HIGH (ref 65–99)

## 2015-05-22 SURGERY — ARTHROPLASTY, KNEE, TOTAL
Anesthesia: Regional | Site: Knee | Laterality: Left

## 2015-05-22 MED ORDER — ONDANSETRON HCL 4 MG/2ML IJ SOLN
4.0000 mg | Freq: Once | INTRAMUSCULAR | Status: AC
  Administered 2015-05-22: 4 mg via INTRAVENOUS
  Filled 2015-05-22: qty 2

## 2015-05-22 MED ORDER — INSULIN ASPART 100 UNIT/ML ~~LOC~~ SOLN
0.0000 [IU] | Freq: Three times a day (TID) | SUBCUTANEOUS | Status: DC
  Administered 2015-05-23: 2 [IU] via SUBCUTANEOUS
  Administered 2015-05-23: 8 [IU] via SUBCUTANEOUS
  Administered 2015-05-24: 2 [IU] via SUBCUTANEOUS
  Administered 2015-05-24 – 2015-05-25 (×3): 3 [IU] via SUBCUTANEOUS
  Administered 2015-05-25: 2 [IU] via SUBCUTANEOUS
  Administered 2015-05-25 – 2015-05-26 (×2): 5 [IU] via SUBCUTANEOUS
  Administered 2015-05-26: 2 [IU] via SUBCUTANEOUS

## 2015-05-22 MED ORDER — VANCOMYCIN HCL 10 G IV SOLR
1750.0000 mg | Freq: Once | INTRAVENOUS | Status: AC
  Administered 2015-05-22: 1750 mg via INTRAVENOUS
  Filled 2015-05-22: qty 1750

## 2015-05-22 MED ORDER — MORPHINE SULFATE (PF) 4 MG/ML IV SOLN
4.0000 mg | Freq: Once | INTRAVENOUS | Status: AC
  Administered 2015-05-22: 4 mg via INTRAVENOUS
  Filled 2015-05-22: qty 1

## 2015-05-22 MED ORDER — HYDROMORPHONE HCL 2 MG PO TABS
2.0000 mg | ORAL_TABLET | Freq: Four times a day (QID) | ORAL | Status: DC | PRN
  Administered 2015-05-22 – 2015-05-23 (×4): 2 mg via ORAL
  Filled 2015-05-22 (×4): qty 1

## 2015-05-22 MED ORDER — HEPARIN SODIUM (PORCINE) 5000 UNIT/ML IJ SOLN
5000.0000 [IU] | Freq: Three times a day (TID) | INTRAMUSCULAR | Status: DC
  Administered 2015-05-23 – 2015-05-26 (×11): 5000 [IU] via SUBCUTANEOUS
  Filled 2015-05-22 (×11): qty 1

## 2015-05-22 MED ORDER — CLINDAMYCIN PHOSPHATE 900 MG/50ML IV SOLN: 900.0000 mg | Freq: Once | INTRAVENOUS | Status: DC

## 2015-05-22 MED ORDER — ALBUTEROL SULFATE (2.5 MG/3ML) 0.083% IN NEBU: 2.5000 mg | INHALATION_SOLUTION | Freq: Four times a day (QID) | RESPIRATORY_TRACT | Status: DC | PRN

## 2015-05-22 MED ORDER — SODIUM CHLORIDE 0.9 % IV BOLUS (SEPSIS)
1000.0000 mL | Freq: Once | INTRAVENOUS | Status: AC
  Administered 2015-05-22: 1000 mL via INTRAVENOUS

## 2015-05-22 MED ORDER — DULOXETINE HCL 60 MG PO CPEP
60.0000 mg | ORAL_CAPSULE | Freq: Every day | ORAL | Status: DC
  Administered 2015-05-23 – 2015-05-26 (×4): 60 mg via ORAL
  Filled 2015-05-22 (×4): qty 1

## 2015-05-22 MED ORDER — ALBUTEROL SULFATE HFA 108 (90 BASE) MCG/ACT IN AERS: 1.0000 | INHALATION_SPRAY | Freq: Four times a day (QID) | RESPIRATORY_TRACT | Status: DC | PRN

## 2015-05-22 MED ORDER — ASPIRIN EC 81 MG PO TBEC
81.0000 mg | DELAYED_RELEASE_TABLET | ORAL | Status: DC
  Administered 2015-05-23 – 2015-05-26 (×4): 81 mg via ORAL
  Filled 2015-05-22 (×4): qty 1

## 2015-05-22 MED ORDER — ROSUVASTATIN CALCIUM 40 MG PO TABS
40.0000 mg | ORAL_TABLET | Freq: Every day | ORAL | Status: DC
  Administered 2015-05-23 – 2015-05-26 (×4): 40 mg via ORAL
  Filled 2015-05-22: qty 1
  Filled 2015-05-22 (×3): qty 2
  Filled 2015-05-22 (×3): qty 1
  Filled 2015-05-22: qty 2

## 2015-05-22 MED ORDER — PANTOPRAZOLE SODIUM 20 MG PO TBEC
20.0000 mg | DELAYED_RELEASE_TABLET | Freq: Every day | ORAL | Status: DC
  Administered 2015-05-23 – 2015-05-26 (×4): 20 mg via ORAL
  Filled 2015-05-22 (×4): qty 1

## 2015-05-22 MED ORDER — GABAPENTIN 600 MG PO TABS
1200.0000 mg | ORAL_TABLET | Freq: Three times a day (TID) | ORAL | Status: DC
  Administered 2015-05-23 – 2015-05-26 (×12): 1200 mg via ORAL
  Filled 2015-05-22 (×12): qty 2

## 2015-05-22 MED ORDER — PIPERACILLIN-TAZOBACTAM 3.375 G IVPB 30 MIN
3.3750 g | Freq: Once | INTRAVENOUS | Status: AC
  Administered 2015-05-22: 3.375 g via INTRAVENOUS
  Filled 2015-05-22: qty 50

## 2015-05-22 MED ORDER — PIPERACILLIN-TAZOBACTAM 3.375 G IVPB
3.3750 g | Freq: Three times a day (TID) | INTRAVENOUS | Status: DC
  Administered 2015-05-23 (×2): 3.375 g via INTRAVENOUS
  Filled 2015-05-22 (×3): qty 50

## 2015-05-22 NOTE — ED Notes (Signed)
Pt states new onset burning in toes near amputated digit on right foot.

## 2015-05-22 NOTE — ED Notes (Addendum)
Pt states she had a toe on her R foot amputated 11/18, states she went to see her PCP for it and was told she had an infection in the area. Area is red and swollen with drainage. Pt placed on doxycyline but foot has gotten worse. Pt in NAD at this time, denies fever. Pedal pulses strong

## 2015-05-22 NOTE — ED Notes (Signed)
Attempted report x 2 

## 2015-05-22 NOTE — ED Notes (Signed)
Dr. Floyd at bedside. 

## 2015-05-22 NOTE — ED Notes (Signed)
Admitting MD at the bedside.  

## 2015-05-22 NOTE — H&P (Signed)
Date: 05/22/2015               Patient Name:  [name] MRN: [id number]  DOB: 1950-09-10 Age / Sex: 64 y.o., female   PCP: Riccardo Dubin, MD         Medical Service: Internal Medicine Teaching Service         Attending Physician: Dr. Deno Etienne, DO    First Contact: Dr. Liberty Handy Pager: [phone]  Second Contact: Dr. Osa Craver Pager: [phone]       After Hours (After 5p/  First Contact Pager: [phone]  weekends / holidays): Second Contact Pager: [phone]   Chief Complaint: right foot pain and drainage   History of Present Illness:   This is a 64 yo woman with HTN, T2DM, GERD, Depression and chronic diabetic foot ulcer who is here for right foot pain and drainage since Monday.  She was recently admitted to the hospital 11/14 to 11/20 for diabetic foot ulcer that did not respond to 3 days of clindamycin. MRI of the foot revealed abscess and surrounding cellultiis but no osteomyelitis. Orthopaedics was consulted who recommended amputation of first digit which was done on 11/18, then she was started on bactrim DS with a finish date of 11/27. Cultures had shown MSSA. She was seen in the clinic on 11/28 when the antibiotics were stopped.  She says that her right foot drainage and swelling started on Monday 12/5- then she called the home health nurse who evaluated it Tuesday morning who asked her to go to Dr Sharol Given so she went Tuesday at 10:30 where the NP/PA put her on doxycycline and bactroban. She says she seemed to have a "water blister" which the NP drained.  Since she has been on oral abx, pt says it has become progressively swollen, red and tender. She says she is having sharp burning 10/10 pain in all of her right digits which is different than her existing neuropathy. She says there was continual red-brown serosanguineous drainage from the site. She came in the ER for continued worsening.  Pt denies any systemic symptoms, no fevers or chills, or n/v. She denies any trauma.  She has not put weight on the site. She has been compliant with her oral meds.   In the ER, her vital signs were stable, she was afebrile with a normal white count and lactic acid. Xray did not show definitve evidence of osteomyelitis. She was started on vanc and zosyn.   Meds: Current Facility-Administered Medications  Medication Dose Route Frequency Provider Last Rate Last Dose  . [START ON 05/23/2015] piperacillin-tazobactam (ZOSYN) IVPB 3.375 g  3.375 g Intravenous Q8H Rachel L Rumbarger, RPH      . vancomycin (VANCOCIN) 1,750 mg in sodium chloride 0.9 % 500 mL IVPB  1,750 mg Intravenous Once Deno Etienne, DO 250 mL/hr at 05/22/15 2131 1,750 mg at 05/22/15 2131   Current Outpatient Prescriptions  Medication Sig Dispense Refill  . albuterol (PROVENTIL HFA;VENTOLIN HFA) 108 (90 BASE) MCG/ACT inhaler Inhale 1-2 puffs into the lungs every 6 (six) hours as needed for wheezing or shortness of breath. 1 Inhaler 3  . amLODipine (NORVASC) 5 MG tablet Take 1 tablet (5 mg total) by mouth daily. 30 tablet 11  . aspirin 81 MG tablet Take 81 mg by mouth every morning.     . butalbital-acetaminophen-caffeine (FIORICET, ESGIC) 50-325-40 MG tablet take 1 tablet by mouth at bedtime 30 tablet 2  . DULoxetine (CYMBALTA) 60 MG capsule Take 1 capsule (60  mg total) by mouth every morning. 90 capsule 3  . gabapentin (NEURONTIN) 600 MG tablet Take 2 tablets (1,200 mg total) by mouth 3 (three) times daily. 180 tablet 6  . glipiZIDE (GLUCOTROL XL) 10 MG 24 hr tablet Take 1 tablet (10 mg total) by mouth daily with supper. 90 tablet 3  . lisinopril (PRINIVIL,ZESTRIL) 40 MG tablet take 1 tablet by mouth once daily (Patient taking differently: take 40 mg by mouth once daily) 90 tablet 3  . metFORMIN (GLUCOPHAGE-XR) 750 MG 24 hr tablet Take 2 tablets (1,500 mg total) by mouth daily with breakfast. 180 tablet 3  . methocarbamol (ROBAXIN) 500 MG tablet Take 1 tablet (500 mg total) by mouth every 6 (six) hours as needed for  muscle spasms. 20 tablet 0  . oxyCODONE (ROXICODONE) 15 MG immediate release tablet Take 1 tablet (15 mg total) by mouth every 4 (four) hours as needed for pain. 30 tablet 0  . pantoprazole (PROTONIX) 20 MG tablet Take 1 tablet (20 mg total) by mouth daily. 90 tablet 3  . rosuvastatin (CRESTOR) 40 MG tablet Take 1 tablet (40 mg total) by mouth daily. 90 tablet 3  . senna-docusate (SENOKOT-S) 8.6-50 MG tablet Take 2 tablets by mouth 2 (two) times daily. 30 tablet 1  . sitaGLIPtin (JANUVIA) 100 MG tablet Take 1 tablet (100 mg total) by mouth daily. (Patient taking differently: Take 100 mg by mouth at bedtime. ) 90 tablet 3  . sulfamethoxazole-trimethoprim (BACTRIM DS,SEPTRA DS) 800-160 MG tablet Take 1 tablet by mouth every 12 (twelve) hours. 14 tablet 0  . triamcinolone ([employer] NASAL ALLERGY SPRAY) 55 MCG/ACT AERO nasal inhaler Place 2 sprays into the nose daily as needed (allergies).      Allergies: Allergies as of 05/22/2015 - Review Complete 05/22/2015  Allergen Reaction Noted  . Flexeril [cyclobenzaprine] Other (See Comments) 08/12/2012  . Soma [carisoprodol] Itching and Rash 05/13/2012   Past Medical History  Diagnosis Date  . Hypertension   . Diabetes mellitus without complication ([place])     diagnosed around 2010, only ever on metformin  . Chronic pain     neck pain, headache, neuropathy  . Hypertriglyceridemia   . Neuromuscular disorder ([place])   . COPD (chronic obstructive pulmonary disease) ([place])   . Depression   . Puncture wound of foot, right 05/17/2012    Tetanus shot 3 yrs ago at [facility] in [place], per pt report   . Brachial plexus disorders    Past Surgical History  Procedure Laterality Date  . Rotator cuff repair    . Carpal tunnel release    . Abdominal hysterectomy    . Cholecystectomy    . Amputation Right 05/01/2015    Procedure: Right Foot 1st Ray Amputation;  Surgeon: Newt Minion, MD;  Location: [place];  Service: Orthopedics;  Laterality: Right;   Family  History  Problem Relation Age of Onset  . Hyperlipidemia Mother   . Heart attack Father 31  . Hypertension Father   . Cancer Paternal Grandfather     Lung cancer  . Cancer Maternal Grandmother   . Heart attack Maternal Grandmother    Social History   Social History  . Marital Status: Widowed    Spouse Name: N/A  . Number of Children: N/A  . Years of Education: 14   Occupational History  . unemployed    Social History Main Topics  . Smoking status: Former Smoker -- 0.00 packs/day for 41 years    Types: Cigarettes  .  Smokeless tobacco: Never Used     Comment: Trying to cut back. 1/2PPD OR LESS  . Alcohol Use: No  . Drug Use: No  . Sexual Activity: Not on file   Other Topics Concern  . Not on file   Social History Narrative   Moved to [facility] from [place], shortly after the death of her husband, to live with her daughter.  She notes significant life stress related to her 31 year-old grand-daughter with bipolar disorder.  She has previously worked as a Occupational psychologist.    Review of Systems:  Per HPI for full ROS  Physical Exam: Blood pressure 124/74, pulse 84, temperature 97.9 F (36.6 C), temperature source Oral, resp. rate 16, SpO2 99 %.  General: A&O, in NAD Neck: supple, midline trachea,  CV: RRR, normal s1, s2, no m/r/g, Resp: equal and symmetric breath sounds, no wheezing heard Abdomen: soft, nontender, nondistended, +BS in all 4 quadrants,  Skin: Right foot had post-surgery sutures, there was no drainage appreciated, though the gauze had drainage.  There was warmth, erythema and tenderness of the right foot.  Extremities: pulses intact b/l, no edema    Lab results: Results for orders placed or performed during the hospital encounter of 05/22/15 (from the past 24 hour(s))  Comprehensive metabolic panel     Status: None   Collection Time: 05/22/15  7:00 PM  Result Value Ref Range   Sodium 139 135 - 145 mmol/L   Potassium 4.7 3.5 - 5.1 mmol/L    Chloride 107 101 - 111 mmol/L   CO2 25 22 - 32 mmol/L   Glucose, Bld 79 65 - 99 mg/dL   BUN 16 6 - 20 mg/dL   Creatinine, Ser 0.80 0.44 - 1.00 mg/dL   Calcium 9.8 8.9 - 10.3 mg/dL   Total Protein 6.8 6.5 - 8.1 g/dL   Albumin 3.7 3.5 - 5.0 g/dL   AST 22 15 - 41 U/L   ALT 29 14 - 54 U/L   Alkaline Phosphatase 59 38 - 126 U/L   Total Bilirubin 0.8 0.3 - 1.2 mg/dL   GFR calc non Af Amer >60 >60 mL/min   GFR calc Af Amer >60 >60 mL/min   Anion gap 7 5 - 15  CBC     Status: None   Collection Time: 05/22/15  7:00 PM  Result Value Ref Range   WBC 8.1 4.0 - 10.5 K/uL   RBC 3.99 3.87 - 5.11 MIL/uL   Hemoglobin 12.1 12.0 - 15.0 g/dL   HCT 39.2 36.0 - 46.0 %   MCV 98.2 78.0 - 100.0 fL   MCH 30.3 26.0 - 34.0 pg   MCHC 30.9 30.0 - 36.0 g/dL   RDW 14.0 11.5 - 15.5 %   Platelets 216 150 - 400 K/uL  I-Stat CG4 Lactic Acid, ED  (not at [facility])     Status: None   Collection Time: 05/22/15  7:20 PM  Result Value Ref Range   Lactic Acid, Venous 1.66 0.5 - 2.0 mmol/L  I-Stat CG4 Lactic Acid, ED  (not at [facility])     Status: None   Collection Time: 05/22/15  9:38 PM  Result Value Ref Range   Lactic Acid, Venous 1.04 0.5 - 2.0 mmol/L     Imaging results:  Dg Foot Complete Right  05/22/2015  CLINICAL DATA:  Right great toe amputation 11/18/ 16, infection with drainage possible osteomyelitis EXAM: RIGHT FOOT COMPLETE - 3+ VIEW COMPARISON:  None. FINDINGS: Three views of  the right foot submitted. No acute fracture or subluxation. The patient is status post amputation of great toe and first metatarsal. There is no evidence of bony erosion of bone destruction to suggest osteomyelitis. Small posterior spur of calcaneus. 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 No definite evidence of osteomyelitis. No acute fracture or subluxation Electronically Signed   By: Lahoma Crocker M.D.   On: 05/22/2015 21:20    Other results:   Assessment & Plan by Problem: Active Problems:   * No  active hospital problems. *  64 yo woman with longstanding T2DM with resulting diabetic foot ulcer, here with right foot pain after having a right toe amputation on 11/18 who finished her course of oral antibiotics  Wound infection? at the surgical site: Pt with diabetic foot ulcer who recently went amputation for the right toe with cultures growing MSSA, and who finished bactrim DS and doxycycline, who is here for a concern of worsening of the wound. She has no white count , and afebrile, but there was some purulent material that was draining earlier. Xray did not show definitve evidence of osteomyelitis.  -vanc and zosyn received, we did not continue it, but can make decision to continue to tomorrow  -repeat cbc in AM -consider repeat MRi for osteomyelitis and/or consulting orthopedic surgery again -obtain ESR and CRP   -recd morphine and on dilaudid 2 mg oral q6 prn -ordered blood cultures but she had already received vanc zosyn  HTN: on home meds of lisinopril and amlodipine  -holding home meds due to soft bp, can consider restarting tomorrow   T2DM: last a1c of 7 -SSI-M -neurontin for neuropathy  HLD- crestor   Depression-  cymbalta   Dispo: Disposition is deferred at this time, awaiting improvement of current medical problems. Anticipated discharge in approximately 2 day(s).   The patient does have a current PCP (Rushil Sherrye Payor, MD) and does need an [facility] hospital follow-up appointment after discharge.  The patient does not have transportation limitations that hinder transportation to clinic appointments.  Signed: Burgess Estelle, MD 05/22/2015, 9:47 PM

## 2015-05-22 NOTE — ED Notes (Signed)
Called xray to report patient is now ready for transport.

## 2015-05-22 NOTE — ED Notes (Signed)
Attempted to call report. Left call back number of [phone].

## 2015-05-22 NOTE — Progress Notes (Signed)
ANTIBIOTIC CONSULT NOTE - INITIAL  Pharmacy Consult for zosyn Indication: wound infxn  Allergies  Allergen Reactions  . Flexeril [Cyclobenzaprine] Other (See Comments)    Prolonged QTc to 571, tachycardia  . Soma [Carisoprodol] Itching and Rash    Patient Measurements:   Adjusted Body Weight:   Vital Signs: Temp: 98.9 F (37.2 C) (12/09 1821) Temp Source: Oral (12/09 1821) BP: 106/49 mmHg (12/09 2000) Pulse Rate: 70 (12/09 2000) Intake/Output from previous day:   Intake/Output from this shift:    Labs:  Recent Labs  05/22/15 1900  WBC 8.1  HGB 12.1  PLT 216  CREATININE 0.80   Estimated Creatinine Clearance: 82.6 mL/min (by C-G formula based on Cr of 0.8). No results for input(s): VANCOTROUGH, VANCOPEAK, VANCORANDOM, GENTTROUGH, GENTPEAK, GENTRANDOM, TOBRATROUGH, TOBRAPEAK, TOBRARND, AMIKACINPEAK, AMIKACINTROU, AMIKACIN in the last 72 hours.   Microbiology: Recent Results (from the past 720 hour(s))  Wound culture     Status: None   Collection Time: 04/27/15  7:21 PM  Result Value Ref Range Status   Specimen Description WOUND RIGHT FOOT  Final   Special Requests NONE  Final   Gram Stain   Final    NO WBC SEEN NO SQUAMOUS EPITHELIAL CELLS SEEN MODERATE GRAM POSITIVE COCCI IN PAIRS Performed at [facility]    Culture   Final    ABUNDANT STAPHYLOCOCCUS AUREUS Note: RIFAMPIN AND GENTAMICIN SHOULD NOT BE USED AS SINGLE DRUGS FOR TREATMENT OF STAPH INFECTIONS. Performed at [facility]    Report Status 05/01/2015 FINAL  Final   Organism ID, Bacteria STAPHYLOCOCCUS AUREUS  Final      Susceptibility   Staphylococcus aureus - MIC*    CLINDAMYCIN >=8 RESISTANT Resistant     ERYTHROMYCIN 0.5 SENSITIVE Sensitive     GENTAMICIN <=0.5 SENSITIVE Sensitive     LEVOFLOXACIN 0.25 SENSITIVE Sensitive     OXACILLIN 0.5 SENSITIVE Sensitive     RIFAMPIN <=0.5 SENSITIVE Sensitive     TRIMETH/SULFA <=10 SENSITIVE Sensitive     VANCOMYCIN 1 SENSITIVE  Sensitive     TETRACYCLINE <=1 SENSITIVE Sensitive     MOXIFLOXACIN <=0.25 SENSITIVE Sensitive     * ABUNDANT STAPHYLOCOCCUS AUREUS  Blood Cultures x 2 sites     Status: None   Collection Time: 04/27/15  7:56 PM  Result Value Ref Range Status   Specimen Description BLOOD RIGHT ANTECUBITAL  Final   Special Requests BOTTLES DRAWN AEROBIC ONLY 6CC  Final   Culture NO GROWTH 5 DAYS  Final   Report Status 05/02/2015 FINAL  Final  Blood Cultures x 2 sites     Status: None   Collection Time: 04/27/15  8:06 PM  Result Value Ref Range Status   Specimen Description BLOOD LEFT ANTECUBITAL  Final   Special Requests BOTTLES DRAWN AEROBIC ONLY 6CC  Final   Culture NO GROWTH 5 DAYS  Final   Report Status 05/02/2015 FINAL  Final  Surgical pcr screen     Status: Abnormal   Collection Time: 05/01/15  1:42 PM  Result Value Ref Range Status   MRSA, PCR NEGATIVE NEGATIVE Final   Staphylococcus aureus POSITIVE (A) NEGATIVE Final    Comment:        The Xpert SA Assay (FDA approved for NASAL specimens in patients over 62 years of age), is one component of a comprehensive surveillance program.  Test performance has been validated by [facility] for patients greater than or equal to 62 year old. It is not intended to diagnose infection  nor to guide or monitor treatment.     Medical History: Past Medical History  Diagnosis Date  . Hypertension   . Diabetes mellitus without complication ([place])     diagnosed around 2010, only ever on metformin  . Chronic pain     neck pain, headache, neuropathy  . Hypertriglyceridemia   . Neuromuscular disorder ([place])   . COPD (chronic obstructive pulmonary disease) ([place])   . Depression   . Puncture wound of foot, right 05/17/2012    Tetanus shot 3 yrs ago at [facility] in [place], per pt report   . Brachial plexus disorders     Medications:  Anti-infectives    Start     Dose/Rate Route Frequency Ordered Stop   05/23/15 0300  piperacillin-tazobactam  (ZOSYN) IVPB 3.375 g     3.375 g 12.5 mL/hr over 240 Minutes Intravenous Every 8 hours 05/22/15 2016     05/22/15 2015  vancomycin (VANCOCIN) 1,750 mg in sodium chloride 0.9 % 500 mL IVPB     1,750 mg 250 mL/hr over 120 Minutes Intravenous  Once 05/22/15 2010     05/22/15 2015  piperacillin-tazobactam (ZOSYN) IVPB 3.375 g     3.375 g 100 mL/hr over 30 Minutes Intravenous  Once 05/22/15 2014     05/22/15 2000  clindamycin (CLEOCIN) IVPB 900 mg  Status:  Discontinued     900 mg 100 mL/hr over 30 Minutes Intravenous  Once 05/22/15 1950 05/22/15 2009     Assessment: 64 yof presented to the ED with new onset burning near amputated digit on her R foot. To start empiric zosyn. Pt is afebrile, WBC WNL, SCr WNL and lactic acid is WNL.   Zosyn 12/9>> Vanc x 1 12/9  Goal of Therapy:  Eradication of infection  Plan:  - Zosyn 3.375gm IV Q8H (4 hr inf) - F/u renal fxn, C&S, clinical status  - Continue vancomycin?  Shayne Diguglielmo, Rande Lawman 05/22/2015,8:16 PM

## 2015-05-22 NOTE — ED Provider Notes (Signed)
CSN: [id number]     Arrival date & time 05/22/15  1807 History   First MD Initiated Contact with Patient 05/22/15 1926     Chief Complaint  Patient presents with  . Foot Pain     (Consider location/radiation/quality/duration/timing/severity/associated sxs/prior Treatment) Patient is a 64 y.o. female presenting with general illness. The history is provided by the patient.  Illness Severity:  Moderate Onset quality:  Gradual Duration:  2 weeks Timing:  Constant Progression:  Worsening Chronicity:  Recurrent Associated symptoms: no chest pain, no congestion, no fever, no headaches, no myalgias, no nausea, no rhinorrhea, no shortness of breath, no vomiting and no wheezing    64 yo F with a chief complaint of a wound infection. 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 Since then patient has had continued erythema and drainage to the area. She has recently seen by her family doctor and started on doxycycline without improvement. Patient denies systemic symptoms, no nausea vomiting or fever. Patient is eating and drinking without difficulty. States she's been compliant with her antibiotics.  Past Medical History  Diagnosis Date  . Hypertension   . Diabetes mellitus without complication ([place])     diagnosed around 2010, only ever on metformin  . Chronic pain     neck pain, headache, neuropathy  . Hypertriglyceridemia   . Neuromuscular disorder ([place])   . COPD (chronic obstructive pulmonary disease) ([place])   . Depression   . Puncture wound of foot, right 05/17/2012    Tetanus shot 3 yrs ago at [facility] in [place], per pt report   . Brachial plexus disorders    Past Surgical History  Procedure Laterality Date  . Rotator cuff repair    . Carpal tunnel release    . Abdominal hysterectomy    . Cholecystectomy    . Amputation Right 05/01/2015    Procedure: Right Foot 1st Ray Amputation;  Surgeon: Newt Minion, MD;   Location: [facility];  Service: Orthopedics;  Laterality: Right;   Family History  Problem Relation Age of Onset  . Hyperlipidemia Mother   . Heart attack Father 62  . Hypertension Father   . Cancer Paternal Grandfather     Lung cancer  . Cancer Maternal Grandmother   . Heart attack Maternal Grandmother    Social History  Substance Use Topics  . Smoking status: Former Smoker -- 0.00 packs/day for 41 years    Types: Cigarettes  . Smokeless tobacco: Never Used     Comment: Trying to cut back. 1/2PPD OR LESS  . Alcohol Use: No   OB History    No data available     Review of Systems  Constitutional: Negative for fever and chills.  HENT: Negative for congestion and rhinorrhea.   Eyes: Negative for redness and visual disturbance.  Respiratory: Negative for shortness of breath and wheezing.   Cardiovascular: Negative for chest pain and palpitations.  Gastrointestinal: Negative for nausea and vomiting.  Genitourinary: Negative for dysuria and urgency.  Musculoskeletal: Positive for arthralgias. Negative for myalgias.  Skin: Positive for wound. Negative for pallor.  Neurological: Negative for dizziness and headaches.      Allergies  Flexeril and Soma  Home Medications   Prior to Admission medications   Medication Sig Start Date End Date Taking? Authorizing Provider  albuterol (PROVENTIL HFA;VENTOLIN HFA) 108 (90 BASE) MCG/ACT inhaler Inhale 1-2 puffs into the lungs every 6 (six) hours as needed for wheezing or  shortness of breath. 01/24/14  Yes Rushil Sherrye Payor, MD  amLODipine (NORVASC) 5 MG tablet Take 1 tablet (5 mg total) by mouth daily. 05/11/15 05/10/16 Yes Alexa Sherral Hammers, MD  aspirin 81 MG tablet Take 81 mg by mouth every morning.    Yes Historical Provider, MD  butalbital-acetaminophen-caffeine (FIORICET, ESGIC) [phone] MG tablet take 1 tablet by mouth at bedtime 05/13/15  Yes Rushil Sherrye Payor, MD  doxycycline (VIBRA-TABS) 100 MG tablet Take 100 mg by mouth 2 (two) times  daily.   Yes Historical Provider, MD  DULoxetine (CYMBALTA) 60 MG capsule Take 1 capsule (60 mg total) by mouth every morning. 05/11/15  Yes Alexa Sherral Hammers, MD  gabapentin (NEURONTIN) 600 MG tablet Take 2 tablets (1,200 mg total) by mouth 3 (three) times daily. 01/23/15  Yes Rushil Sherrye Payor, MD  glipiZIDE (GLUCOTROL XL) 10 MG 24 hr tablet Take 1 tablet (10 mg total) by mouth daily with supper. 07/18/14  Yes Rushil Sherrye Payor, MD  lisinopril (PRINIVIL,ZESTRIL) 40 MG tablet take 1 tablet by mouth once daily Patient taking differently: take 40 mg by mouth once daily 12/05/14  Yes Rushil Sherrye Payor, MD  metFORMIN (GLUCOPHAGE-XR) 750 MG 24 hr tablet Take 2 tablets (1,500 mg total) by mouth daily with breakfast. 01/29/15  Yes Rushil Sherrye Payor, MD  methocarbamol (ROBAXIN) 500 MG tablet Take 1 tablet (500 mg total) by mouth every 6 (six) hours as needed for muscle spasms. 05/02/15  Yes Jule Ser, DO  mupirocin ointment (BACTROBAN) 2 % Place 1 application into the nose daily as needed. Apply to foot   Yes Historical Provider, MD  oxyCODONE (ROXICODONE) 15 MG immediate release tablet Take 1 tablet (15 mg total) by mouth every 4 (four) hours as needed for pain. 05/02/15  Yes Jule Ser, DO  oxyCODONE-acetaminophen (PERCOCET) 10-325 MG tablet Take 1 tablet by mouth every 4 (four) hours as needed for pain.   Yes Historical Provider, MD  pantoprazole (PROTONIX) 20 MG tablet Take 1 tablet (20 mg total) by mouth daily. 07/18/14  Yes Rushil Sherrye Payor, MD  rosuvastatin (CRESTOR) 40 MG tablet Take 1 tablet (40 mg total) by mouth daily. 04/08/15  Yes Rushil Sherrye Payor, MD  sitaGLIPtin (JANUVIA) 100 MG tablet Take 1 tablet (100 mg total) by mouth daily. Patient taking differently: Take 100 mg by mouth at bedtime.  11/13/14  Yes Lucious Groves, DO  senna-docusate (SENOKOT-S) 8.6-50 MG tablet Take 2 tablets by mouth 2 (two) times daily. Patient not taking: Reported on 05/22/2015 05/03/15   Juliet Rude, MD   sulfamethoxazole-trimethoprim (BACTRIM DS,SEPTRA DS) 800-160 MG tablet Take 1 tablet by mouth every 12 (twelve) hours. Patient not taking: Reported on 05/22/2015 05/03/15   Sindy Guadeloupe Rivet, MD   BP 136/71 mmHg  Pulse 72  Temp(Src) 97.7 F (36.5 C) (Oral)  Resp 18  SpO2 100% Physical Exam  Constitutional: She is oriented to person, place, and time. She appears well-developed and well-nourished. No distress.  HENT:  Head: Normocephalic and atraumatic.  Eyes: EOM are normal. Pupils are equal, round, and reactive to light.  Neck: Normal range of motion. Neck supple.  Cardiovascular: Normal rate and regular rhythm.  Exam reveals no gallop and no friction rub.   No murmur heard. Pulmonary/Chest: Effort normal. She has no wheezes. She has no rales.  Abdominal: Soft. She exhibits no distension. There is no tenderness. There is no rebound and no guarding.  Musculoskeletal: She exhibits edema and tenderness.  Sutures along the location of  the right first metatarsal. Significant erythema with purulent effusion. No noted crepitus, flaccid ruptured bulla noted.  Neurological: She is alert and oriented to person, place, and time.  Skin: Skin is warm and dry. She is not diaphoretic.  Psychiatric: She has a normal mood and affect. Her behavior is normal.  Nursing note and vitals reviewed.   ED Course  Procedures (including critical care time) Labs Review Labs Reviewed  GLUCOSE, CAPILLARY - Abnormal; Notable for the following:    Glucose-Capillary 104 (*)    All other components within normal limits  CULTURE, BLOOD (ROUTINE X 2)  CULTURE, BLOOD (ROUTINE X 2)  COMPREHENSIVE METABOLIC PANEL  CBC  CBC  SEDIMENTATION RATE  C-REACTIVE PROTEIN  I-STAT CG4 LACTIC ACID, ED  I-STAT CG4 LACTIC ACID, ED    Imaging Review Dg Foot Complete Right  05/22/2015  CLINICAL DATA:  Right great toe amputation 11/18/ 16, infection with drainage possible osteomyelitis EXAM: RIGHT FOOT COMPLETE - 3+ VIEW  COMPARISON:  None. FINDINGS: Three views of the right foot submitted. No acute fracture or subluxation. The patient is status post amputation of great toe and first metatarsal. There is no evidence of bony erosion of bone destruction to suggest osteomyelitis. Small posterior spur of calcaneus. 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 No definite evidence of osteomyelitis. No acute fracture or subluxation Electronically Signed   By: Lahoma Crocker M.D.   On: 05/22/2015 21:20   I have personally reviewed and evaluated these images and lab results as part of my medical decision-making.   EKG Interpretation None      MDM   Final diagnoses:  Foot abscess    64 yo F with a chief complaints of a foot ulcer. Patient has failed outpatient therapy. Concern for possible osteomyelitis will start her on vancomycin and Zosyn. X-ray without overt signs of osteomyelitis. Admit to internal medicine resident teaching service.  The patients results and plan were reviewed and discussed.   Any x-rays performed were independently reviewed by myself.   Differential diagnosis were considered with the presenting HPI.  Medications  piperacillin-tazobactam (ZOSYN) IVPB 3.375 g (not administered)  heparin injection 5,000 Units (not administered)  aspirin EC tablet 81 mg (not administered)  DULoxetine (CYMBALTA) DR capsule 60 mg (not administered)  gabapentin (NEURONTIN) tablet 1,200 mg (not administered)  pantoprazole (PROTONIX) EC tablet 20 mg (not administered)  rosuvastatin (CRESTOR) tablet 40 mg (not administered)  insulin aspart (novoLOG) injection 0-15 Units (not administered)  HYDROmorphone (DILAUDID) tablet 2 mg (2 mg Oral Given 05/22/15 2330)  albuterol (PROVENTIL) (2.5 MG/3ML) 0.083% nebulizer solution 2.5 mg (not administered)  morphine 4 MG/ML injection 4 mg (4 mg Intravenous Given 05/22/15 2029)  ondansetron (ZOFRAN) injection 4 mg (4 mg Intravenous Given 05/22/15 2028)   sodium chloride 0.9 % bolus 1,000 mL (0 mLs Intravenous Stopped 05/22/15 2135)  vancomycin (VANCOCIN) 1,750 mg in sodium chloride 0.9 % 500 mL IVPB (1,750 mg Intravenous Transfusing/Transfer 05/22/15 2320)  piperacillin-tazobactam (ZOSYN) IVPB 3.375 g (0 g Intravenous Stopped 05/22/15 2117)    Filed Vitals:   05/22/15 2215 05/22/15 2230 05/22/15 2245 05/22/15 2349  BP: 113/56 112/66 131/71 136/71  Pulse: 80 83 75 72  Temp:    97.7 F (36.5 C)  TempSrc:    Oral  Resp:    18  SpO2: 94% 96% 95% 100%    Final diagnoses:  Foot abscess    Admission/ observation were discussed with the admitting physician, patient and/or family and they are  comfortable with the plan.      Deno Etienne, DO 05/23/15 [phone]

## 2015-05-22 NOTE — ED Notes (Signed)
Phlebotomy at the bedside  

## 2015-05-23 DIAGNOSIS — Z7984 Long term (current) use of oral hypoglycemic drugs: Secondary | ICD-10-CM | POA: Diagnosis not present

## 2015-05-23 DIAGNOSIS — Z7982 Long term (current) use of aspirin: Secondary | ICD-10-CM | POA: Diagnosis not present

## 2015-05-23 DIAGNOSIS — G709 Myoneural disorder, unspecified: Secondary | ICD-10-CM | POA: Diagnosis present

## 2015-05-23 DIAGNOSIS — R51 Headache: Secondary | ICD-10-CM | POA: Diagnosis present

## 2015-05-23 DIAGNOSIS — G8929 Other chronic pain: Secondary | ICD-10-CM | POA: Diagnosis present

## 2015-05-23 DIAGNOSIS — E11621 Type 2 diabetes mellitus with foot ulcer: Secondary | ICD-10-CM | POA: Diagnosis present

## 2015-05-23 DIAGNOSIS — Z6831 Body mass index (BMI) 31.0-31.9, adult: Secondary | ICD-10-CM | POA: Diagnosis not present

## 2015-05-23 DIAGNOSIS — E1165 Type 2 diabetes mellitus with hyperglycemia: Secondary | ICD-10-CM | POA: Diagnosis not present

## 2015-05-23 DIAGNOSIS — Z79899 Other long term (current) drug therapy: Secondary | ICD-10-CM | POA: Diagnosis not present

## 2015-05-23 DIAGNOSIS — Z89411 Acquired absence of right great toe: Secondary | ICD-10-CM | POA: Diagnosis not present

## 2015-05-23 DIAGNOSIS — F329 Major depressive disorder, single episode, unspecified: Secondary | ICD-10-CM | POA: Diagnosis present

## 2015-05-23 DIAGNOSIS — Z9049 Acquired absence of other specified parts of digestive tract: Secondary | ICD-10-CM | POA: Diagnosis not present

## 2015-05-23 DIAGNOSIS — S91301D Unspecified open wound, right foot, subsequent encounter: Secondary | ICD-10-CM

## 2015-05-23 DIAGNOSIS — E1140 Type 2 diabetes mellitus with diabetic neuropathy, unspecified: Secondary | ICD-10-CM | POA: Diagnosis not present

## 2015-05-23 DIAGNOSIS — Z90710 Acquired absence of both cervix and uterus: Secondary | ICD-10-CM | POA: Diagnosis not present

## 2015-05-23 DIAGNOSIS — Z888 Allergy status to other drugs, medicaments and biological substances status: Secondary | ICD-10-CM | POA: Diagnosis not present

## 2015-05-23 DIAGNOSIS — Y838 Other surgical procedures as the cause of abnormal reaction of the patient, or of later complication, without mention of misadventure at the time of the procedure: Secondary | ICD-10-CM | POA: Diagnosis present

## 2015-05-23 DIAGNOSIS — J449 Chronic obstructive pulmonary disease, unspecified: Secondary | ICD-10-CM | POA: Diagnosis present

## 2015-05-23 DIAGNOSIS — E669 Obesity, unspecified: Secondary | ICD-10-CM | POA: Diagnosis present

## 2015-05-23 DIAGNOSIS — Z8249 Family history of ischemic heart disease and other diseases of the circulatory system: Secondary | ICD-10-CM | POA: Diagnosis not present

## 2015-05-23 DIAGNOSIS — L02619 Cutaneous abscess of unspecified foot: Secondary | ICD-10-CM | POA: Diagnosis not present

## 2015-05-23 DIAGNOSIS — M542 Cervicalgia: Secondary | ICD-10-CM | POA: Diagnosis present

## 2015-05-23 DIAGNOSIS — I10 Essential (primary) hypertension: Secondary | ICD-10-CM | POA: Diagnosis present

## 2015-05-23 DIAGNOSIS — M79671 Pain in right foot: Secondary | ICD-10-CM | POA: Diagnosis not present

## 2015-05-23 DIAGNOSIS — Z87891 Personal history of nicotine dependence: Secondary | ICD-10-CM | POA: Diagnosis not present

## 2015-05-23 DIAGNOSIS — L97519 Non-pressure chronic ulcer of other part of right foot with unspecified severity: Secondary | ICD-10-CM | POA: Diagnosis present

## 2015-05-23 DIAGNOSIS — L089 Local infection of the skin and subcutaneous tissue, unspecified: Secondary | ICD-10-CM | POA: Diagnosis present

## 2015-05-23 DIAGNOSIS — T814XXA Infection following a procedure, initial encounter: Secondary | ICD-10-CM | POA: Diagnosis present

## 2015-05-23 DIAGNOSIS — K219 Gastro-esophageal reflux disease without esophagitis: Secondary | ICD-10-CM | POA: Diagnosis present

## 2015-05-23 LAB — CBC
HCT: 37.8 % (ref 36.0–46.0)
Hemoglobin: 11.7 g/dL — ABNORMAL LOW (ref 12.0–15.0)
MCH: 30.5 pg (ref 26.0–34.0)
MCHC: 31 g/dL (ref 30.0–36.0)
MCV: 98.4 fL (ref 78.0–100.0)
PLATELETS: 194 10*3/uL (ref 150–400)
RBC: 3.84 MIL/uL — AB (ref 3.87–5.11)
RDW: 14 % (ref 11.5–15.5)
WBC: 7.4 10*3/uL (ref 4.0–10.5)

## 2015-05-23 LAB — GLUCOSE, CAPILLARY
Glucose-Capillary: 83 mg/dL (ref 65–99)
Glucose-Capillary: 299 mg/dL — ABNORMAL HIGH (ref 65–99)
Glucose-Capillary: 132 mg/dL — ABNORMAL HIGH (ref 65–99)
Glucose-Capillary: 146 mg/dL — ABNORMAL HIGH (ref 65–99)

## 2015-05-23 LAB — SEDIMENTATION RATE: SED RATE: 30 mm/h — AB (ref 0–22)

## 2015-05-23 LAB — C-REACTIVE PROTEIN

## 2015-05-23 MED ORDER — NITROGLYCERIN 0.2 MG/HR TD PT24: 0.2000 mg | MEDICATED_PATCH | Freq: Every day | TRANSDERMAL | Status: DC

## 2015-05-23 MED ORDER — VANCOMYCIN HCL IN DEXTROSE 1-5 GM/200ML-% IV SOLN
1000.0000 mg | Freq: Three times a day (TID) | INTRAVENOUS | Status: DC
  Administered 2015-05-23 – 2015-05-25 (×6): 1000 mg via INTRAVENOUS
  Filled 2015-05-23 (×9): qty 200

## 2015-05-23 MED ORDER — NITROGLYCERIN 0.2 MG/HR TD PT24
0.2000 mg | MEDICATED_PATCH | Freq: Every day | TRANSDERMAL | Status: DC
  Administered 2015-05-23 – 2015-05-26 (×4): 0.2 mg via TRANSDERMAL
  Filled 2015-05-23 (×4): qty 1

## 2015-05-23 MED ORDER — HYDROMORPHONE HCL 2 MG PO TABS
2.0000 mg | ORAL_TABLET | ORAL | Status: DC | PRN
  Administered 2015-05-23 – 2015-05-25 (×14): 2 mg via ORAL
  Filled 2015-05-23 (×14): qty 1

## 2015-05-23 MED ORDER — ENSURE ENLIVE PO LIQD
237.0000 mL | Freq: Two times a day (BID) | ORAL | Status: DC
  Administered 2015-05-23 – 2015-05-26 (×6): 237 mL via ORAL

## 2015-05-23 MED ORDER — ACETAMINOPHEN 325 MG PO TABS: 650.0000 mg | ORAL_TABLET | Freq: Four times a day (QID) | ORAL | Status: DC | PRN

## 2015-05-23 MED ORDER — DIPHENHYDRAMINE HCL 25 MG PO CAPS
25.0000 mg | ORAL_CAPSULE | Freq: Four times a day (QID) | ORAL | Status: DC | PRN
  Administered 2015-05-23 – 2015-05-25 (×6): 25 mg via ORAL
  Filled 2015-05-23 (×6): qty 1

## 2015-05-23 NOTE — Evaluation (Signed)
Physical Therapy Evaluation Patient Details Name: [name] MRN: [id number] DOB: [birth year] Today's Date: 05/23/2015   History of Present Illness  Patient is a 64 y/o female with diabetic insensate neuropathy who was s/p right foot first ray amputation 11/18 presents with bloody drainage and increasing pain at the surgical site. Started on Vancomycin for wound infection. PMH includes DM, HTN, COPD,   Clinical Impression  Patient presents with impaired mobility secondary to weakness and NWB status RLE. Tolerated squat pivot transfer with Min guard assist for safety while adhering to WB status. Pt able to be compliant with WB status however chooses not to at times. Pt will need w/c at home for mobility as pt not able to hop through LLE due to weakness and leg "giving out." Discussed living room setup with [facility] and need for w/c. Will follow acutely to maximize independence and mobility prior to return home.    Follow Up Recommendations Home health PT    Equipment Recommendations  Wheelchair (measurements PT);Wheelchair cushion (measurements PT)    Recommendations for Other Services OT consult     Precautions / Restrictions Precautions Precautions: Fall Required Braces or Orthoses: Other Brace/Splint Other Brace/Splint: post op shoe present from prior admission? Restrictions Weight Bearing Restrictions: Yes RLE Weight Bearing: Non weight bearing      Mobility  Bed Mobility Overal bed mobility: Modified Independent                Transfers Overall transfer level: Needs assistance Equipment used: None Transfers: Squat Pivot Transfers;Sit to/from [facility] Sit to Stand: Supervision Stand pivot transfers: Supervision Squat pivot transfers: Min guard     General transfer comment: SPT from bed to/from [facility] placing weight through foot- supervision due to placing weight through RLE. Explained importance of not placing weight through RLE and instructed in  proper squat pivot technique to get to chair towards left side.  Able to maintain NWB when she wants too.  Ambulation/Gait Ambulation/Gait assistance:  (Deferred secondary to weakness in left knee and reports of it "giving out" when hopping.)              Stairs            Wheelchair Mobility    Modified Rankin (Stroke Patients Only)       Balance Overall balance assessment: Needs assistance Sitting-balance support: Feet supported;No upper extremity supported Sitting balance-Leahy Scale: Normal Sitting balance - Comments: Able to donn sock independently sitting EOB without LOB.   Standing balance support: During functional activity Standing balance-Leahy Scale: Poor Standing balance comment: Not able to stand while maintaining NWB RLE without support.                              Pertinent Vitals/Pain Pain Assessment: Faces Faces Pain Scale: Hurts even more Pain Location: right foot Pain Descriptors / Indicators: Burning Pain Intervention(s): Monitored during session;Repositioned    Home Living Family/patient expects to be discharged to:: Private residence Living Arrangements: Children;Other relatives (23 y/o Health and safety inspector, Presenter, broadcasting, 31 y/o son) Available Help at Discharge: Family;Available 24 hours/day Type of Home: House Home Access: Stairs to enter Entrance Stairs-Rails:  (uses pillars at entrance) Technical brewer of Steps: 2 Home Layout: One level Home Equipment: Bedside commode;Walker - 2 wheels      Prior Function Level of Independence: Independent with assistive device(s);Needs assistance         Comments: Pt reports being non compliant with WB status at  home, "it is impossible because I was supposed to have my knee operated on this week and it gives out when I hop." Sleeps in recliner     Hand Dominance        Extremity/Trunk Assessment   Upper Extremity Assessment: Defer to OT evaluation           Lower Extremity  Assessment: Generalized weakness      Cervical / Trunk Assessment: Normal  Communication   Communication: No difficulties  Cognition Arousal/Alertness: Awake/alert Behavior During Therapy: WFL for tasks assessed/performed Overall Cognitive Status: Within Functional Limits for tasks assessed                      General Comments General comments (skin integrity, edema, etc.): Educated on importance of compliance, elevation and NWB RLE. Discussed having family assist with bumping w/c up steps to enter home as well as living room setup as w/c will not fit through bathroom door.    Exercises Other Exercises Other Exercises: partial half stands using UEs to push up from chair for strengthening of LLE and to prepare for transfers x10. NWb RLE.      Assessment/Plan    PT Assessment Patient needs continued PT services  PT Diagnosis Acute pain;Generalized weakness   PT Problem List Decreased strength;Pain;Impaired sensation;Decreased activity tolerance;Decreased balance;Decreased mobility  PT Treatment Interventions Balance training;Functional mobility training;Therapeutic activities;Therapeutic exercise;Wheelchair mobility training;Patient/family education;Stair training;Gait training   PT Goals (Current goals can be found in the Care Plan section) Acute Rehab PT Goals Patient Stated Goal: to go home, "I am not going to any nursing home." PT Goal Formulation: With patient Time For Goal Achievement: 06/06/15 Potential to Achieve Goals: Good    Frequency Min 3X/week   Barriers to discharge Inaccessible home environment stairs to navigate into house    Co-evaluation               End of Session Equipment Utilized During Treatment: Gait belt Activity Tolerance: Patient tolerated treatment well Patient left: in chair;with call bell/phone within reach Nurse Communication: Mobility status    Functional Assessment Tool Used: Clinical judgment Functional Limitation:  Mobility: Walking and moving around Mobility: Walking and Moving Around Current Status [id number]): At least 1 percent but less than 20 percent impaired, limited or restricted Mobility: Walking and Moving Around Goal Status [phone]): At least 1 percent but less than 20 percent impaired, limited or restricted    Time: [id number] PT Time Calculation (min) (ACUTE ONLY): 23 min   Charges:   PT Evaluation $Initial PT Evaluation Tier I: 1 Procedure PT Treatments $Therapeutic Activity: 8-22 mins   PT G Codes:   PT G-Codes **NOT FOR INPATIENT CLASS** Functional Assessment Tool Used: Clinical judgment Functional Limitation: Mobility: Walking and moving around Mobility: Walking and Moving Around Current Status [id number]): At least 1 percent but less than 20 percent impaired, limited or restricted Mobility: Walking and Moving Around Goal Status [phone]): At least 1 percent but less than 20 percent impaired, limited or restricted    [place] 05/23/2015, 2:37 PM Wray Kearns, [place], DPT [phone]

## 2015-05-23 NOTE — Progress Notes (Signed)
Subjective: [name] says her pain in her foot is about a 6, with 5 or 6 being a manageable level. She denies any subjective fevers, chills, or night sweats.   Objective: Vital signs in last 24 hours: Filed Vitals:   05/22/15 2245 05/22/15 2349 05/22/15 2358 05/23/15 0548  BP: 131/71 136/71  103/53  Pulse: 75 72  73  Temp:  97.7 F (36.5 C)  97.8 F (36.6 C)  TempSrc:  Oral  Oral  Resp:  18  18  Height:   5\' 6"  (1.676 m)   Weight:   194 lb 7.1 oz (88.2 kg)   SpO2: 95% 100%  99%   Weight change:   Intake/Output Summary (Last 24 hours) at 05/23/15 1135 Last data filed at 05/23/15 0900  Gross per 24 hour  Intake   1240 ml  Output      0 ml  Net   1240 ml   General: Lying in bed, no acute distress Cardiovascular: RRR, no m/r/g Pulmonary: CTAB Abdomen: Soft NT/ND. +BS Skin: Warmth and erythema emanating from incisional area with sutures in place. No drainage appreciated. Extremities: 2+ DP pulses bilaterally without edema   Lab Results: Basic Metabolic Panel:  Recent Labs Lab 05/22/15 1900  NA 139  K 4.7  CL 107  CO2 25  GLUCOSE 79  BUN 16  CREATININE 0.80  CALCIUM 9.8   Liver Function Tests:  Recent Labs Lab 05/22/15 1900  AST 22  ALT 29  ALKPHOS 59  BILITOT 0.8  PROT 6.8  ALBUMIN 3.7   CBC:  Recent Labs Lab 05/22/15 1900 05/23/15 0048  WBC 8.1 7.4  HGB 12.1 11.7*  HCT 39.2 37.8  MCV 98.2 98.4  PLT 216 194   CBG:  Recent Labs Lab 05/22/15 2352 05/23/15 0752  GLUCAP 104* 83    Studies/Results: Dg Foot Complete Right  05/22/2015  CLINICAL DATA:  Right great toe amputation 11/18/ 16, infection with drainage possible osteomyelitis EXAM: RIGHT FOOT COMPLETE - 3+ VIEW COMPARISON:  None. FINDINGS: Three views of the right foot submitted. No acute fracture or subluxation. The patient is status post amputation of great toe and first metatarsal. There is no evidence of bony erosion of bone destruction to suggest osteomyelitis. Small  posterior spur of calcaneus. 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 No definite evidence of osteomyelitis. No acute fracture or subluxation Electronically Signed   By: Lahoma Crocker M.D.   On: 05/22/2015 21:20   Medications: I have reviewed the patient's current medications. Scheduled Meds: . aspirin EC  81 mg Oral BH-q7a  . DULoxetine  60 mg Oral Daily  . feeding supplement (ENSURE ENLIVE)  237 mL Oral BID BM  . gabapentin  1,200 mg Oral TID  . heparin  5,000 Units Subcutaneous 3 times per day  . insulin aspart  0-15 Units Subcutaneous TID WC  . pantoprazole  20 mg Oral Daily  . piperacillin-tazobactam (ZOSYN)  IV  3.375 g Intravenous Q8H  . rosuvastatin  40 mg Oral Daily   Continuous Infusions:  PRN Meds:.albuterol, diphenhydrAMINE, HYDROmorphone Assessment/Plan:  Post-Surgical Wound Infection verus Post-Surgical Changes: Patient lacks a fever or leukocytosis and has been on appropriate antibiotics (MSSA - Bactrim DS 11/18-11/27, Doxycycline + Bactroban 12/6 - 12/9). She has receive one dose of IV vancomycin in the ED. No purulent drainage on exam, but definite erythema associated with surgical site. I do not suspect osteomyelitis given previous foot MRI on 11/14 without evidence of osteomyelitis.  Also sed rate has improved and CRP has normalized since 11/14. I would not repeat an MRI. - Consult Dr. Sharol Given from orthopedic surgery for his opinion of whether this represents post-surgical changes - Dilaudid 2 mg po q6h prn  HTN: BP 103/53. Still soft -holding home amlodipine and lisinopril   T2DM: last a1c of 7. BG 83 this AM -SSI-M -neurontin for neuropathy  HLD: Crestor   Depression: Cymbalta   DVT Prophylaxis: Heparin [place]  Dispo: Disposition is deferred at this time, awaiting improvement of current medical problems.  Anticipated discharge in approximately 2-3 day(s).   The patient does have a current PCP (Rushil Sherrye Payor, MD) and does need an [facility]  hospital follow-up appointment after discharge.  The patient does not have transportation limitations that hinder transportation to clinic appointments.  .Services Needed at time of discharge: Y = Yes, Blank = No PT:   OT:   RN:   Equipment:   Other:       Liberty Handy, MD 05/23/2015, 11:35 AM

## 2015-05-23 NOTE — Progress Notes (Signed)
Pt complained of severe pain of 8. Pt's pain medicine is not due until another 89minutes. MD paged. She advised to wait to give the pain medication when due. Pt informed. Pain medicine given as scheduled, as recommended by physician.

## 2015-05-23 NOTE — Consult Note (Signed)
Reason for Consult: Ischemic incision status post right foot first ray amputation approximately 4 weeks out from surgery. Referring Physician: Dr. [name] is an 64 y.o. female.  HPI: Patient is a 64 year old woman with diabetic insensate neuropathy who was status post right foot first ray amputation. Patient presented at this time with bloody drainage and increasing pain at the surgical site.  Past Medical History  Diagnosis Date  . Hypertension   . Diabetes mellitus without complication ([place])     diagnosed around 2010, only ever on metformin  . Chronic pain     neck pain, headache, neuropathy  . Hypertriglyceridemia   . Neuromuscular disorder ([place])   . COPD (chronic obstructive pulmonary disease) ([place])   . Depression   . Puncture wound of foot, right 05/17/2012    Tetanus shot 3 yrs ago at [facility] in [place], per pt report   . Brachial plexus disorders     Past Surgical History  Procedure Laterality Date  . Rotator cuff repair    . Carpal tunnel release    . Abdominal hysterectomy    . Cholecystectomy    . Amputation Right 05/01/2015    Procedure: Right Foot 1st Ray Amputation;  Surgeon: Newt Minion, MD;  Location: [facility];  Service: Orthopedics;  Laterality: Right;    Family History  Problem Relation Age of Onset  . Hyperlipidemia Mother   . Heart attack Father 61  . Hypertension Father   . Cancer Paternal Grandfather     Lung cancer  . Cancer Maternal Grandmother   . Heart attack Maternal Grandmother     Social History:  reports that she has quit smoking. Her smoking use included Cigarettes. She smoked 0.00 packs per day for 41 years. She has never used smokeless tobacco. She reports that she does not drink alcohol or use illicit drugs.  Allergies:  Allergies  Allergen Reactions  . Flexeril [Cyclobenzaprine] Other (See Comments)    Prolonged QTc to 571, tachycardia  . Soma [Carisoprodol] Itching and Rash    Medications: I have reviewed the  patient's current medications.  Results for orders placed or performed during the hospital encounter of 05/22/15 (from the past 48 hour(s))  Comprehensive metabolic panel     Status: None   Collection Time: 05/22/15  7:00 PM  Result Value Ref Range   Sodium 139 135 - 145 mmol/L   Potassium 4.7 3.5 - 5.1 mmol/L   Chloride 107 101 - 111 mmol/L   CO2 25 22 - 32 mmol/L   Glucose, Bld 79 65 - 99 mg/dL   BUN 16 6 - 20 mg/dL   Creatinine, Ser 0.80 0.44 - 1.00 mg/dL   Calcium 9.8 8.9 - 10.3 mg/dL   Total Protein 6.8 6.5 - 8.1 g/dL   Albumin 3.7 3.5 - 5.0 g/dL   AST 22 15 - 41 U/L   ALT 29 14 - 54 U/L   Alkaline Phosphatase 59 38 - 126 U/L   Total Bilirubin 0.8 0.3 - 1.2 mg/dL   GFR calc non Af Amer >60 >60 mL/min   GFR calc Af Amer >60 >60 mL/min    Comment: (NOTE) The eGFR has been calculated using the CKD EPI equation. This calculation has not been validated in all clinical situations. eGFR's persistently <60 mL/min signify possible Chronic Kidney Disease.    Anion gap 7 5 - 15  CBC     Status: None   Collection Time: 05/22/15  7:00 PM  Result Value  Ref Range   WBC 8.1 4.0 - 10.5 K/uL   RBC 3.99 3.87 - 5.11 MIL/uL   Hemoglobin 12.1 12.0 - 15.0 g/dL   HCT 39.2 36.0 - 46.0 %   MCV 98.2 78.0 - 100.0 fL   MCH 30.3 26.0 - 34.0 pg   MCHC 30.9 30.0 - 36.0 g/dL   RDW 14.0 11.5 - 15.5 %   Platelets 216 150 - 400 K/uL  I-Stat CG4 Lactic Acid, ED  (not at [facility])     Status: None   Collection Time: 05/22/15  7:20 PM  Result Value Ref Range   Lactic Acid, Venous 1.66 0.5 - 2.0 mmol/L  I-Stat CG4 Lactic Acid, ED  (not at [facility])     Status: None   Collection Time: 05/22/15  9:38 PM  Result Value Ref Range   Lactic Acid, Venous 1.04 0.5 - 2.0 mmol/L  Glucose, capillary     Status: Abnormal   Collection Time: 05/22/15 11:52 PM  Result Value Ref Range   Glucose-Capillary 104 (H) 65 - 99 mg/dL  CBC     Status: Abnormal   Collection Time: 05/23/15 12:48 AM  Result Value Ref Range   WBC  7.4 4.0 - 10.5 K/uL   RBC 3.84 (L) 3.87 - 5.11 MIL/uL   Hemoglobin 11.7 (L) 12.0 - 15.0 g/dL   HCT 37.8 36.0 - 46.0 %   MCV 98.4 78.0 - 100.0 fL   MCH 30.5 26.0 - 34.0 pg   MCHC 31.0 30.0 - 36.0 g/dL   RDW 14.0 11.5 - 15.5 %   Platelets 194 150 - 400 K/uL  Sedimentation rate     Status: Abnormal   Collection Time: 05/23/15 12:48 AM  Result Value Ref Range   Sed Rate 30 (H) 0 - 22 mm/hr  C-reactive protein     Status: None   Collection Time: 05/23/15 12:48 AM  Result Value Ref Range   CRP <0.5 <1.0 mg/dL  Glucose, capillary     Status: None   Collection Time: 05/23/15  7:52 AM  Result Value Ref Range   Glucose-Capillary 83 65 - 99 mg/dL    Dg Foot Complete Right  05/22/2015  CLINICAL DATA:  Right great toe amputation 11/18/ 16, infection with drainage possible osteomyelitis EXAM: RIGHT FOOT COMPLETE - 3+ VIEW COMPARISON:  None. FINDINGS: Three views of the right foot submitted. No acute fracture or subluxation. The patient is status post amputation of great toe and first metatarsal. There is no evidence of bony erosion of bone destruction to suggest osteomyelitis. Small posterior spur of calcaneus. 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 No definite evidence of osteomyelitis. No acute fracture or subluxation Electronically Signed   By: Lahoma Crocker M.D.   On: 05/22/2015 21:20    Review of Systems  All other systems reviewed and are negative.  Blood pressure 103/53, pulse 73, temperature 97.8 F (36.6 C), temperature source Oral, resp. rate 18, height _0  (1.676 m), weight 88.2 kg (194 lb 7.1 oz), SpO2 99 %. Physical Exam   on examination patient's both lower extremities are in extremely poor condition.  Patient's feet are dirty with poor callus no evidence of wearing shoe wear her socks. Patient has no plantar ulcers but does have callus on the plantar aspect of both feet. Patient has a strong dorsalis pedis pulse on the right ankle. The surgical incision  shows mild ischemic changes there is no dehiscence of the wound there is no purulence there  is some mild redness but no cellulitis.   Patient had been getting a prescription for nitroglycerin patch she has not been wearing her nitroglycerin patch to improve the microcirculation. Patient was given instructions for strict nonweightbearing on the right patient states that she has not been compliant with nonweightbearing.  Assessment/Pla assessment: One-month status post right foot first ray amputation with diabetic insensate neuropathy with mild wound dehiscence on the right.  Plan: Discussed with the patient the importance of nonweightbearing. Discussed that if she cannot be nonweightbearing at home, discharge to skilled nursing would be an option. Patient states she does have family at home that can help her. Recommended that she use a wheelchair to get around the house. Recommended she continue use the nitroglycerin patch over the dorsalis pedis artery and the location of patch was shown to her. She will need to wash the foot with soap and water daily and apply a dry dressing.Would continue IV antibiotics for 48 hours and anticipate patient could be discharged on Monday. I will follow-up in the office later on this week.   [name] V 05/23/2015, 11:37 AM

## 2015-05-23 NOTE — Progress Notes (Signed)
ANTIBIOTIC CONSULT NOTE - INITIAL  Pharmacy Consult for Vancomycin Indication: wound infection  Allergies  Allergen Reactions  . Flexeril [Cyclobenzaprine] Other (See Comments)    Prolonged QTc to 571, tachycardia  . Soma [Carisoprodol] Itching and Rash    Patient Measurements: Height: 5\' 6"  (167.6 cm) Weight: 194 lb 7.1 oz (88.2 kg) IBW/kg (Calculated) : 59.3  Vital Signs: Temp: 97.8 F (36.6 C) (12/10 0548) Temp Source: Oral (12/10 0548) BP: 103/53 mmHg (12/10 0548) Pulse Rate: 73 (12/10 0548) Intake/Output from previous day: 12/09 0701 - 12/10 0700 In: 1000 [I.V.:1000] Out: -  Intake/Output from this shift: Total I/O In: 240 [P.O.:240] Out: -   Labs:  Recent Labs  05/22/15 1900 05/23/15 0048  WBC 8.1 7.4  HGB 12.1 11.7*  PLT 216 194  CREATININE 0.80  --    Estimated Creatinine Clearance: 81.6 mL/min (by C-G formula based on Cr of 0.8). No results for input(s): VANCOTROUGH, VANCOPEAK, VANCORANDOM, GENTTROUGH, GENTPEAK, GENTRANDOM, TOBRATROUGH, TOBRAPEAK, TOBRARND, AMIKACINPEAK, AMIKACINTROU, AMIKACIN in the last 72 hours.   Microbiology: Recent Results (from the past 720 hour(s))  Wound culture     Status: None   Collection Time: 04/27/15  7:21 PM  Result Value Ref Range Status   Specimen Description WOUND RIGHT FOOT  Final   Special Requests NONE  Final   Gram Stain   Final    NO WBC SEEN NO SQUAMOUS EPITHELIAL CELLS SEEN MODERATE GRAM POSITIVE COCCI IN PAIRS Performed at [facility]    Culture   Final    ABUNDANT STAPHYLOCOCCUS AUREUS Note: RIFAMPIN AND GENTAMICIN SHOULD NOT BE USED AS SINGLE DRUGS FOR TREATMENT OF STAPH INFECTIONS. Performed at [facility]    Report Status 05/01/2015 FINAL  Final   Organism ID, Bacteria STAPHYLOCOCCUS AUREUS  Final      Susceptibility   Staphylococcus aureus - MIC*    CLINDAMYCIN >=8 RESISTANT Resistant     ERYTHROMYCIN 0.5 SENSITIVE Sensitive     GENTAMICIN <=0.5 SENSITIVE Sensitive    LEVOFLOXACIN 0.25 SENSITIVE Sensitive     OXACILLIN 0.5 SENSITIVE Sensitive     RIFAMPIN <=0.5 SENSITIVE Sensitive     TRIMETH/SULFA <=10 SENSITIVE Sensitive     VANCOMYCIN 1 SENSITIVE Sensitive     TETRACYCLINE <=1 SENSITIVE Sensitive     MOXIFLOXACIN <=0.25 SENSITIVE Sensitive     * ABUNDANT STAPHYLOCOCCUS AUREUS  Blood Cultures x 2 sites     Status: None   Collection Time: 04/27/15  7:56 PM  Result Value Ref Range Status   Specimen Description BLOOD RIGHT ANTECUBITAL  Final   Special Requests BOTTLES DRAWN AEROBIC ONLY 6CC  Final   Culture NO GROWTH 5 DAYS  Final   Report Status 05/02/2015 FINAL  Final  Blood Cultures x 2 sites     Status: None   Collection Time: 04/27/15  8:06 PM  Result Value Ref Range Status   Specimen Description BLOOD LEFT ANTECUBITAL  Final   Special Requests BOTTLES DRAWN AEROBIC ONLY 6CC  Final   Culture NO GROWTH 5 DAYS  Final   Report Status 05/02/2015 FINAL  Final  Surgical pcr screen     Status: Abnormal   Collection Time: 05/01/15  1:42 PM  Result Value Ref Range Status   MRSA, PCR NEGATIVE NEGATIVE Final   Staphylococcus aureus POSITIVE (A) NEGATIVE Final    Comment:        The Xpert SA Assay (FDA approved for NASAL specimens in patients over 62 years of age), is one component  of a comprehensive surveillance program.  Test performance has been validated by [facility] for patients greater than or equal to 62 year old. It is not intended to diagnose infection nor to guide or monitor treatment.     Medical History: Past Medical History  Diagnosis Date  . Hypertension   . Diabetes mellitus without complication ([place])     diagnosed around 2010, only ever on metformin  . Chronic pain     neck pain, headache, neuropathy  . Hypertriglyceridemia   . Neuromuscular disorder ([place])   . COPD (chronic obstructive pulmonary disease) ([place])   . Depression   . Puncture wound of foot, right 05/17/2012    Tetanus shot 3 yrs ago at [facility]  in [place], per pt report   . Brachial plexus disorders    Assessment:  64 y/o F to ED with new onset burning near amputated digit on her r foot. Pharmacy consulted to start vancomycin. Afeb, WBC WNL, CrCl ~82 mL/min.  Zosyn 12/9>>12/10 Vanc 1750mg  x 1 12/9  Goal of Therapy:  Vancomycin trough level 15-20 mcg/ml  Plan:  - Vancomycin IV 1000 mg q8h - F/u renal fxn, C&S, clinical status   Angela Burke, PharmD Pharmacy Resident Pager: [phone] 05/23/2015,12:25 PM

## 2015-05-23 NOTE — Progress Notes (Signed)
Nutrition Brief Note  Patient identified on the Malnutrition Screening Tool (MST) Report  Wt Readings from Last 15 Encounters:  05/22/15 194 lb 7.1 oz (88.2 kg)  05/11/15 195 lb 8 oz (88.678 kg)  05/02/15 192 lb 14.1 oz (87.491 kg)  03/19/15 189 lb (85.73 kg)  03/05/15 186 lb 1.6 oz (84.414 kg)  02/25/15 188 lb (85.276 kg)  02/14/15 189 lb 9 oz (85.985 kg)  02/13/15 190 lb 6.4 oz (86.365 kg)  02/06/15 187 lb 11.2 oz (85.14 kg)  01/23/15 186 lb 9.6 oz (84.641 kg)  11/13/14 196 lb (88.905 kg)  09/22/14 196 lb 4.8 oz (89.041 kg)  08/15/14 198 lb 3.2 oz (89.903 kg)  07/11/14 200 lb (90.719 kg)  05/14/14 202 lb 6.4 oz (91.808 kg)   Body mass index is 31.4 kg/(m^2). Patient meets criteria for Obese based on current BMI.   Pt reports that she has had an appetite for 1.5 years. She does not know why. She said her normal weight used to be 225 for the last 10 years. She says recently there have been no changes.  She denies n/v/c/d. She ate her breakfast + an Ensure. She has a good appetite. Took lunch/dinner request.   Current diet order is Carb Modified, patient is consuming approximately 100% of meals at this time. Labs and medications reviewed.   No nutrition interventions warranted at this time. If nutrition issues arise, please consult RD.   Burtis Junes RD, LDN Nutrition Pager: [phone] 05/23/2015 11:11 AM

## 2015-05-24 LAB — GLUCOSE, CAPILLARY
GLUCOSE-CAPILLARY: 170 mg/dL — AB (ref 65–99)
GLUCOSE-CAPILLARY: 194 mg/dL — AB (ref 65–99)
Glucose-Capillary: 122 mg/dL — ABNORMAL HIGH (ref 65–99)
Glucose-Capillary: 163 mg/dL — ABNORMAL HIGH (ref 65–99)

## 2015-05-24 MED ORDER — LISINOPRIL 20 MG PO TABS
20.0000 mg | ORAL_TABLET | Freq: Every day | ORAL | Status: DC
  Administered 2015-05-24 – 2015-05-26 (×3): 20 mg via ORAL
  Filled 2015-05-24 (×2): qty 1

## 2015-05-24 NOTE — Care Management Note (Signed)
Case Management Note  Patient Details  Name: [name] MRN: [id number] Date of Birth: [birth year]  Subjective/Objective:                  Foot pain  Action/Plan: CM spoke with patient at the bedside. Patient reports she is active with [facility]. Tiffany at [facility] notified of the admission and will need resumption of care orders at discharge. Estrella Myrtle at [facility] notified of DME order and will deliver the wheelchair tomorrow. Patient states she lives with her daughter and grandchildren. Will continue to follow for discharge needs.   Expected Discharge Date:                  Expected Discharge Plan:  [place]  In-House Referral:     Discharge planning Services  CM Consult  Post Acute Care Choice:  Resumption of Svcs/PTA Provider Choice offered to:   Patient  DME Arranged:  Youth worker wheelchair with seat cushion DME Agency:  [place]:   RN and PT  [facility] Agency:    [place]   Status of Service:  In process, will continue to follow  Medicare Important Message Given:    Date Medicare IM Given:    Medicare IM give by:    Date Additional Medicare IM Given:    Additional Medicare Important Message give by:     If discussed at [place] of Stay Meetings, dates discussed:    Additional Comments:  Apolonio Schneiders, RN 05/24/2015, 10:36 AM

## 2015-05-24 NOTE — Progress Notes (Signed)
Subjective:  Patient was seen and examined this morning. She feels that the swelling and redness have improved, but the pain persists. She denies any fever or chills, nausea, vomiting or diarrhea. She does think the Vancomycin makes her itch, but she denies throat tightness, shortness of breath, trouble swallowing or hives.   Objective: Vital signs in last 24 hours: Filed Vitals:   05/23/15 0548 05/23/15 1300 05/23/15 2025 05/24/15 0548  BP: 103/53 103/51 105/75 128/69  Pulse: 73 80 79 68  Temp: 97.8 F (36.6 C) 98.8 F (37.1 C) 98.3 F (36.8 C) 98.6 F (37 C)  TempSrc: Oral Oral Oral Oral  Resp: 18 18 20 15   Height:      Weight:      SpO2: 99% 96% 96% 98%   Weight change:   Intake/Output Summary (Last 24 hours) at 05/24/15 0945 Last data filed at 05/24/15 0600  Gross per 24 hour  Intake    300 ml  Output   1150 ml  Net   -850 ml   General: Vital signs reviewed.  Patient is obese, in no acute distress and cooperative with exam.  Cardiovascular: RRR, S1 normal, S2 normal. Pulmonary/Chest: Scattered rhonchi, no wheezes or rales. Abdominal: Soft, non-tender, non-distended, BS + Musculoskeletal: Right first ray s/p amputation. Long incision with sutures along medial border of right foot after amputation. Erythema and edema are improved. Dried sanguinous fluid present along incision, no purulent drainage. Normal sensation of right foot, normal movement, intact pedal pulses. Nitro patch in place over dorsalis pedis artery.  Extremities: No lower extremity edema bilaterally, pulses symmetric and intact bilaterally. Neurological: Sensory intact to light touch bilaterally.   Lab Results: Basic Metabolic Panel:  Recent Labs Lab 05/22/15 1900  NA 139  K 4.7  CL 107  CO2 25  GLUCOSE 79  BUN 16  CREATININE 0.80  CALCIUM 9.8   Liver Function Tests:  Recent Labs Lab 05/22/15 1900  AST 22  ALT 29  ALKPHOS 59  BILITOT 0.8  PROT 6.8  ALBUMIN 3.7   CBC:  Recent  Labs Lab 05/22/15 1900 05/23/15 0048  WBC 8.1 7.4  HGB 12.1 11.7*  HCT 39.2 37.8  MCV 98.2 98.4  PLT 216 194   CBG:  Recent Labs Lab 05/22/15 2352 05/23/15 0752 05/23/15 1143 05/23/15 1622 05/23/15 2117 05/24/15 0815  GLUCAP 104* 83 299* 132* 146* 122*   Urine Drug Screen: Drugs of Abuse     Component Value Date/Time   LABOPIA NONE DETECTED 03/09/2013 2346   COCAINSCRNUR NONE DETECTED 03/09/2013 2346   LABBENZ NONE DETECTED 03/09/2013 2346   AMPHETMU NONE DETECTED 03/09/2013 2346   THCU NONE DETECTED 03/09/2013 2346   LABBARB NONE DETECTED 03/09/2013 2346    Studies/Results: Dg Foot Complete Right  05/22/2015  CLINICAL DATA:  Right great toe amputation 11/18/ 16, infection with drainage possible osteomyelitis EXAM: RIGHT FOOT COMPLETE - 3+ VIEW COMPARISON:  None. FINDINGS: Three views of the right foot submitted. No acute fracture or subluxation. The patient is status post amputation of great toe and first metatarsal. There is no evidence of bony erosion of bone destruction to suggest osteomyelitis. Small posterior spur of calcaneus. 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 No definite evidence of osteomyelitis. No acute fracture or subluxation Electronically Signed   By: Lahoma Crocker M.D.   On: 05/22/2015 21:20   Medications:  I have reviewed the patient's current medications. Prior to Admission:  Prescriptions prior to admission  Medication  Sig Dispense Refill Last Dose  . albuterol (PROVENTIL HFA;VENTOLIN HFA) 108 (90 BASE) MCG/ACT inhaler Inhale 1-2 puffs into the lungs every 6 (six) hours as needed for wheezing or shortness of breath. 1 Inhaler 3 Past Month at Unknown time  . amLODipine (NORVASC) 5 MG tablet Take 1 tablet (5 mg total) by mouth daily. 30 tablet 11 05/21/2015 at Unknown time  . aspirin 81 MG tablet Take 81 mg by mouth every morning.    05/22/2015 at Unknown time  . butalbital-acetaminophen-caffeine (FIORICET, ESGIC) 50-325-40  MG tablet take 1 tablet by mouth at bedtime 30 tablet 2 05/21/2015 at Unknown time  . doxycycline (VIBRA-TABS) 100 MG tablet Take 100 mg by mouth 2 (two) times daily.   05/22/2015 at Unknown time  . DULoxetine (CYMBALTA) 60 MG capsule Take 1 capsule (60 mg total) by mouth every morning. 90 capsule 3 05/22/2015 at Unknown time  . gabapentin (NEURONTIN) 600 MG tablet Take 2 tablets (1,200 mg total) by mouth 3 (three) times daily. 180 tablet 6 05/22/2015 at Unknown time  . glipiZIDE (GLUCOTROL XL) 10 MG 24 hr tablet Take 1 tablet (10 mg total) by mouth daily with supper. 90 tablet 3 05/21/2015 at Unknown time  . lisinopril (PRINIVIL,ZESTRIL) 40 MG tablet take 1 tablet by mouth once daily (Patient taking differently: take 40 mg by mouth once daily) 90 tablet 3 05/22/2015 at Unknown time  . metFORMIN (GLUCOPHAGE-XR) 750 MG 24 hr tablet Take 2 tablets (1,500 mg total) by mouth daily with breakfast. 180 tablet 3 05/22/2015 at Unknown time  . methocarbamol (ROBAXIN) 500 MG tablet Take 1 tablet (500 mg total) by mouth every 6 (six) hours as needed for muscle spasms. 20 tablet 0 Past Week at Unknown time  . mupirocin ointment (BACTROBAN) 2 % Place 1 application into the nose daily as needed. Apply to foot   05/22/2015 at Unknown time  . oxyCODONE (ROXICODONE) 15 MG immediate release tablet Take 1 tablet (15 mg total) by mouth every 4 (four) hours as needed for pain. 30 tablet 0 Past Week at Unknown time  . oxyCODONE-acetaminophen (PERCOCET) 10-325 MG tablet Take 1 tablet by mouth every 4 (four) hours as needed for pain.   05/22/2015 at Unknown time  . pantoprazole (PROTONIX) 20 MG tablet Take 1 tablet (20 mg total) by mouth daily. 90 tablet 3 05/22/2015 at Unknown time  . rosuvastatin (CRESTOR) 40 MG tablet Take 1 tablet (40 mg total) by mouth daily. 90 tablet 3 05/22/2015 at Unknown time  . sitaGLIPtin (JANUVIA) 100 MG tablet Take 1 tablet (100 mg total) by mouth daily. (Patient taking differently: Take 100 mg by mouth at  bedtime. ) 90 tablet 3 05/21/2015 at Unknown time  . senna-docusate (SENOKOT-S) 8.6-50 MG tablet Take 2 tablets by mouth 2 (two) times daily. (Patient not taking: Reported on 05/22/2015) 30 tablet 1 Not Taking at Unknown time  . sulfamethoxazole-trimethoprim (BACTRIM DS,SEPTRA DS) 800-160 MG tablet Take 1 tablet by mouth every 12 (twelve) hours. (Patient not taking: Reported on 05/22/2015) 14 tablet 0 Not Taking at Unknown time   Scheduled Meds: . aspirin EC  81 mg Oral [id number]  . DULoxetine  60 mg Oral Daily  . feeding supplement (ENSURE ENLIVE)  237 mL Oral BID BM  . gabapentin  1,200 mg Oral TID  . heparin  5,000 Units Subcutaneous 3 times per day  . insulin aspart  0-15 Units Subcutaneous TID WC  . lisinopril  20 mg Oral Daily  . nitroGLYCERIN  0.2 mg  Transdermal Daily  . pantoprazole  20 mg Oral Daily  . rosuvastatin  40 mg Oral Daily  . vancomycin  1,000 mg Intravenous Q8H   Continuous Infusions:  PRN Meds:.acetaminophen, albuterol, diphenhydrAMINE, HYDROmorphone Assessment/Plan: Active Problems:   Type 2 diabetes, uncontrolled, with neuropathy (HCC)   Hypertension   Hyperlipidemia   Foot ulcer (HCC)   Foot pain, right  Post-Surgical Wound Infection: No fever, chills, leukocytosis. Dr. Sharol Given kindly saw our patient and recommended continuing IV antibiotics for 48 hours (end 12/11) and for patient to avoid weight bearing, obtain wheelchair, use nitroglycerin patch over dorsalis pedis artery, wash with soap and water.  -Dilaudid 2 mg po q6h prn -Blood Cx 12/9>> pending -Continue IV vancomycin -Transition to po antibiotics tomorrow -PT/OT -Wheelchair ordered   HTN: BP remain normotensive off of medications. Patient is on amlodipine 10 mg daily and lisinopril 40 mg daily at home.  -Restart lisinopril at a lower dose 20 mg daily -Hold amlodipine  T2DM: Last HgbA1c of 7. Fasting glucose 122 this morning.  -SSI-M -Neurontin 1200 mg TID for neuropathy  HLD: Patient is on  rosuvastatin 40 mg daily at home. -Continue Rosuvastatin 40 mg daily  Depression: Stable.  -Continue Cymbalta 60 mg daily  DVT/PE ppx: Heparin SQ FEN: CM diet Code: FULL  Dispo: Disposition is deferred at this time, awaiting improvement of current medical problems.  Anticipated discharge in approximately 1-2 day(s).   The patient does have a current PCP (Rushil Sherrye Payor, MD) and does need an [facility] hospital follow-up appointment after discharge.  The patient does not have transportation limitations that hinder transportation to clinic appointments.   LOS: 1 day   Osa Craver, DO PGY-2 Internal Medicine Resident Pager # [phone] 05/24/2015 9:45 AM

## 2015-05-25 DIAGNOSIS — M79671 Pain in right foot: Secondary | ICD-10-CM

## 2015-05-25 DIAGNOSIS — L02619 Cutaneous abscess of unspecified foot: Secondary | ICD-10-CM

## 2015-05-25 DIAGNOSIS — E1140 Type 2 diabetes mellitus with diabetic neuropathy, unspecified: Secondary | ICD-10-CM

## 2015-05-25 DIAGNOSIS — E1165 Type 2 diabetes mellitus with hyperglycemia: Secondary | ICD-10-CM

## 2015-05-25 LAB — GLUCOSE, CAPILLARY
GLUCOSE-CAPILLARY: 126 mg/dL — AB (ref 65–99)
GLUCOSE-CAPILLARY: 198 mg/dL — AB (ref 65–99)
Glucose-Capillary: 245 mg/dL — ABNORMAL HIGH (ref 65–99)
Glucose-Capillary: 184 mg/dL — ABNORMAL HIGH (ref 65–99)

## 2015-05-25 MED ORDER — AMITRIPTYLINE HCL 50 MG PO TABS
25.0000 mg | ORAL_TABLET | Freq: Every day | ORAL | Status: DC
  Administered 2015-05-25: 25 mg via ORAL
  Filled 2015-05-25: qty 1

## 2015-05-25 MED ORDER — AMOXICILLIN-POT CLAVULANATE 875-125 MG PO TABS
1.0000 | ORAL_TABLET | Freq: Two times a day (BID) | ORAL | Status: DC
  Administered 2015-05-25 – 2015-05-26 (×3): 1 via ORAL
  Filled 2015-05-25 (×3): qty 1

## 2015-05-25 NOTE — Progress Notes (Signed)
Internal Medicine Attending  Date: 05/25/2015  Patient name: [name] Medical record number: [id number] Date of birth: August 07, 1950 Age: 64 y.o. Gender: female  I saw and evaluated the patient. I reviewed the resident's note by Dr. Marijean Bravo and I agree with the resident's findings and plans as documented in his progress note.  She continues to note burning pain of her right foot. The edema and erythema have improved. Examination reveals no purulent discharge or wound dehiscence. We will convert to oral antibiotics and add amitriptyline to her maximal dose Neurontin for her presumed neuropathic pain. Her postoperative incisional pain should be improving at this point and we will wean the Dilaudid. I anticipate she will be ready for discharge home tomorrow.

## 2015-05-25 NOTE — Progress Notes (Signed)
Subjective:  Patient was seen and examined this morning. She reports that she still cannot continue to walk on her foot. She describes it as a burning sensation, similar to her neuropathy but worse. She denies any fever, chills, or night sweats.   Objective: Vital signs in last 24 hours: Filed Vitals:   05/23/15 2025 05/24/15 0548 05/24/15 2126 05/25/15 1342  BP: 105/75 128/69 137/74 126/66  Pulse: 79 68 71 76  Temp: 98.3 F (36.8 C) 98.6 F (37 C) 97.7 F (36.5 C) 97.9 F (36.6 C)  TempSrc: Oral Oral Oral Oral  Resp: 20 15 15 23   Height:      Weight:      SpO2: 96% 98% 95% 99%   Weight change:   Intake/Output Summary (Last 24 hours) at 05/25/15 1629 Last data filed at 05/25/15 1300  Gross per 24 hour  Intake   1720 ml  Output   1100 ml  Net    620 ml   General: Vital signs reviewed.  Patient is obese, in no acute distress and cooperative with exam.  Cardiovascular: RRR, S1 normal, S2 normal. Pulmonary/Chest: Scattered rhonchi, no wheezes or rales. Abdominal: Soft, non-tender, non-distended, BS + Musculoskeletal: Right first ray s/p amputation. Long incision with sutures along medial border of right foot after amputation. Erythema and edema are improved. Dried sanguinous fluid present along incision, no purulent drainage. Normal sensation of right foot, normal movement, intact pedal pulses. Nitro patch in place. Extremities: No lower extremity edema bilaterally, pulses symmetric and intact bilaterally. Neurological: Sensory intact to light touch bilaterally.   Lab Results: Basic Metabolic Panel:  Recent Labs Lab 05/22/15 1900  NA 139  K 4.7  CL 107  CO2 25  GLUCOSE 79  BUN 16  CREATININE 0.80  CALCIUM 9.8   Liver Function Tests:  Recent Labs Lab 05/22/15 1900  AST 22  ALT 29  ALKPHOS 59  BILITOT 0.8  PROT 6.8  ALBUMIN 3.7   CBC:  Recent Labs Lab 05/22/15 1900 05/23/15 0048  WBC 8.1 7.4  HGB 12.1 11.7*  HCT 39.2 37.8  MCV 98.2 98.4  PLT  216 194   CBG:  Recent Labs Lab 05/24/15 0815 05/24/15 1141 05/24/15 1641 05/24/15 2125 05/25/15 0757 05/25/15 1202  GLUCAP 122* 170* 194* 163* 126* 245*    Studies/Results: No results found. Medications:  I have reviewed the patient's current medications. Prior to Admission:  Prescriptions prior to admission  Medication Sig Dispense Refill Last Dose  . albuterol (PROVENTIL HFA;VENTOLIN HFA) 108 (90 BASE) MCG/ACT inhaler Inhale 1-2 puffs into the lungs every 6 (six) hours as needed for wheezing or shortness of breath. 1 Inhaler 3 Past Month at Unknown time  . amLODipine (NORVASC) 5 MG tablet Take 1 tablet (5 mg total) by mouth daily. 30 tablet 11 05/21/2015 at Unknown time  . aspirin 81 MG tablet Take 81 mg by mouth every morning.    05/22/2015 at Unknown time  . butalbital-acetaminophen-caffeine (FIORICET, ESGIC) 50-325-40 MG tablet take 1 tablet by mouth at bedtime 30 tablet 2 05/21/2015 at Unknown time  . doxycycline (VIBRA-TABS) 100 MG tablet Take 100 mg by mouth 2 (two) times daily.   05/22/2015 at Unknown time  . DULoxetine (CYMBALTA) 60 MG capsule Take 1 capsule (60 mg total) by mouth every morning. 90 capsule 3 05/22/2015 at Unknown time  . gabapentin (NEURONTIN) 600 MG tablet Take 2 tablets (1,200 mg total) by mouth 3 (three) times daily. 180 tablet 6 05/22/2015 at Unknown time  .  glipiZIDE (GLUCOTROL XL) 10 MG 24 hr tablet Take 1 tablet (10 mg total) by mouth daily with supper. 90 tablet 3 05/21/2015 at Unknown time  . lisinopril (PRINIVIL,ZESTRIL) 40 MG tablet take 1 tablet by mouth once daily (Patient taking differently: take 40 mg by mouth once daily) 90 tablet 3 05/22/2015 at Unknown time  . metFORMIN (GLUCOPHAGE-XR) 750 MG 24 hr tablet Take 2 tablets (1,500 mg total) by mouth daily with breakfast. 180 tablet 3 05/22/2015 at Unknown time  . methocarbamol (ROBAXIN) 500 MG tablet Take 1 tablet (500 mg total) by mouth every 6 (six) hours as needed for muscle spasms. 20 tablet 0 Past  Week at Unknown time  . mupirocin ointment (BACTROBAN) 2 % Place 1 application into the nose daily as needed. Apply to foot   05/22/2015 at Unknown time  . oxyCODONE (ROXICODONE) 15 MG immediate release tablet Take 1 tablet (15 mg total) by mouth every 4 (four) hours as needed for pain. 30 tablet 0 Past Week at Unknown time  . oxyCODONE-acetaminophen (PERCOCET) 10-325 MG tablet Take 1 tablet by mouth every 4 (four) hours as needed for pain.   05/22/2015 at Unknown time  . pantoprazole (PROTONIX) 20 MG tablet Take 1 tablet (20 mg total) by mouth daily. 90 tablet 3 05/22/2015 at Unknown time  . rosuvastatin (CRESTOR) 40 MG tablet Take 1 tablet (40 mg total) by mouth daily. 90 tablet 3 05/22/2015 at Unknown time  . sitaGLIPtin (JANUVIA) 100 MG tablet Take 1 tablet (100 mg total) by mouth daily. (Patient taking differently: Take 100 mg by mouth at bedtime. ) 90 tablet 3 05/21/2015 at Unknown time  . senna-docusate (SENOKOT-S) 8.6-50 MG tablet Take 2 tablets by mouth 2 (two) times daily. (Patient not taking: Reported on 05/22/2015) 30 tablet 1 Not Taking at Unknown time  . sulfamethoxazole-trimethoprim (BACTRIM DS,SEPTRA DS) 800-160 MG tablet Take 1 tablet by mouth every 12 (twelve) hours. (Patient not taking: Reported on 05/22/2015) 14 tablet 0 Not Taking at Unknown time   Scheduled Meds: . amitriptyline  25 mg Oral QHS  . amoxicillin-clavulanate  1 tablet Oral Q12H  . aspirin EC  81 mg Oral BH-q7a  . DULoxetine  60 mg Oral Daily  . feeding supplement (ENSURE ENLIVE)  237 mL Oral BID BM  . gabapentin  1,200 mg Oral TID  . heparin  5,000 Units Subcutaneous 3 times per day  . insulin aspart  0-15 Units Subcutaneous TID WC  . lisinopril  20 mg Oral Daily  . nitroGLYCERIN  0.2 mg Transdermal Daily  . pantoprazole  20 mg Oral Daily  . rosuvastatin  40 mg Oral Daily   Continuous Infusions:  PRN Meds:.acetaminophen, albuterol, diphenhydrAMINE, HYDROmorphone Assessment/Plan: Active Problems:   Type 2  diabetes, uncontrolled, with neuropathy (HCC)   Hypertension   Hyperlipidemia   Foot ulcer (HCC)   Foot pain, right  Post-Surgical Wound Infection: No fever, chills, leukocytosis. Dr. Sharol Given kindly saw our patient and recommended continuing IV antibiotics for 48 hours (end 12/11) and for patient to avoid weight bearing, obtain wheelchair, use nitroglycerin patch over dorsalis pedis artery, wash with soap and water. I also suspect much of her pain is due to worsening neuropathic pain. -Dilaudid 2 mg po q6h prn -Blood Cx 12/9>> pending -Start augmentin today  -PT/OT -Wheelchair ordered   HTN: BP remain normotensive off of medications. Patient is on amlodipine 10 mg daily and lisinopril 40 mg daily at home.  -Restart lisinopril at a lower dose 20 mg daily -Hold  amlodipine  T2DM: Last HgbA1c of 7. Fasting glucose 126 this morning.  -SSI-M -Neurontin 1200 mg TID for neuropathy - Add amitriptyline 25 mg daily  HLD: Patient is on rosuvastatin 40 mg daily at home. -Continue Rosuvastatin 40 mg daily  Depression: Stable.  -Continue Cymbalta 60 mg daily  DVT/PE ppx: Heparin SQ FEN: CM diet Code: FULL  Dispo: Disposition is deferred at this time, awaiting improvement of current medical problems.  Anticipated discharge in approximately 1-2 day(s).   The patient does have a current PCP (Rushil Sherrye Payor, MD) and does need an [facility] hospital follow-up appointment after discharge.  The patient does not have transportation limitations that hinder transportation to clinic appointments.   LOS: 2 days   Liberty Handy PGY-1 Internal Medicine Resident Pager # [phone]  05/25/2015 4:29 PM

## 2015-05-25 NOTE — Evaluation (Signed)
Occupational Therapy Evaluation Patient Details Name: [name] MRN: [id number] DOB: [birth year] Today's Date: 05/25/2015    History of Present Illness Patient is a 64 y.o. 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 Started on Vancomycin for wound infection. PMH includes DM, HTN, COPD.   Clinical Impression   Pt admitted with above. Unsure of pt's PLOF. Feel pt will benefit from acute OT to increase independence and safety prior to d/c.     Follow Up Recommendations  No OT follow up;Supervision/Assistance - 24 hour    Equipment Recommendations  None recommended by OT    Recommendations for Other Services       Precautions / Restrictions Precautions Precautions: Fall Restrictions Weight Bearing Restrictions: Yes RLE Weight Bearing: Non weight bearing      Mobility Bed Mobility      Pt in chair when OT arrived.            Transfers Overall transfer level: Needs assistance       Stand pivot transfers: Min guard       General transfer comment: Educated on technique.    Balance            Min guard for pivot transfers. Able to simulate functional task while standing with Min guard assist.                                ADL Overall ADL's : Needs assistance/impaired                     Lower Body Dressing: Min guard;Sit to/from stand   Toilet Transfer: Min guard;Stand-pivot;BSC   Toileting- Water quality scientist and Hygiene: Min guard;Sit to/from stand       Functional mobility during ADLs: Min guard (stand pivot) General ADL Comments: Educated on transfer technique. Discussed tub transfer transfer techniques and options for shower chair. Pt plans to use BSC sideways in tub.  Educated on safety such as safe footwear and recommended someone be with her for transfers.      Vision     Perception      Praxis      Pertinent Vitals/Pain Pain Assessment: 0-10 Pain Score: 7  Pain Location: left foot Pain Intervention(s): Monitored during session     Hand Dominance     Extremity/Trunk Assessment Upper Extremity Assessment Upper Extremity Assessment: Overall WFL for tasks assessed   Lower Extremity Assessment Lower Extremity Assessment: Defer to PT evaluation       Communication Communication Communication: No difficulties   Cognition Arousal/Alertness: Awake/alert Behavior During Therapy: WFL for tasks assessed/performed Overall Cognitive Status: Within Functional Limits for tasks assessed                     General Comments       Exercises       Shoulder Instructions      Home Living Family/patient expects to be discharged to:: Private residence Living Arrangements: Children;Other relatives Available Help at Discharge: Family;Available 24 hours/day Type of Home: House Home Access: Stairs to enter [employer] of Steps: 2 Entrance Stairs-Rails: None (uses pillars at entrance) Home Layout: One level     Bathroom Shower/Tub: Teacher, early years/pre: Standard     Home Equipment: Bedside commode;Walker - 2 wheels  Prior Functioning/Environment          Comments: unsure of PLOF    OT Diagnosis: Acute pain   OT Problem List: Pain;Decreased knowledge of use of DME or AE;Decreased knowledge of precautions;Impaired balance (sitting and/or standing)   OT Treatment/Interventions: Self-care/ADL training;DME and/or AE instruction;Therapeutic activities;Balance training;Patient/family education    OT Goals(Current goals can be found in the care plan section) Acute Rehab OT Goals Patient Stated Goal: not go to rehab facility OT Goal Formulation: With patient Time For Goal Achievement: 06/01/15 Potential to Achieve Goals: Good ADL Goals Pt Will Perform Lower Body Dressing: with set-up;sit to/from stand;sitting/lateral  leans;with supervision Pt Will Transfer to Toilet: with set-up;stand pivot transfer;bedside commode Pt Will Perform Toileting - Clothing Manipulation and hygiene: with set-up;with supervision;sit to/from stand;sitting/lateral leans  OT Frequency: Min 2X/week   Barriers to D/C:            Co-evaluation              End of Session Nurse Communication: Other (comment) (asked her if she was going to clean LLE)  Activity Tolerance: Patient tolerated treatment well Patient left: in chair;with call bell/phone within reach   Time: [id number] OT Time Calculation (min): 20 min Charges:  OT General Charges $OT Visit: 1 Procedure OT Evaluation $Initial OT Evaluation Tier I: 1 Procedure G-CodesBenito Mccreedy OTR/L [id number] 05/25/2015, 12:49 PM

## 2015-05-25 NOTE — Care Management Note (Signed)
Case Management Note  Patient Details  Name: Alaiyna Moessner MRN: [id number] Date of Birth: September 21, 1950  Subjective/Objective:   Patient is for possible dc today, will resume HHRN, HHPT with [facility] and she has the [facility] w/chair in her room to go home with.  Jermaine with [facility] will bring the wheel chair lock extension.                  Action/Plan:   Expected Discharge Date:                  Expected Discharge Plan:  [place]  In-House Referral:     Discharge planning Services  CM Consult  Post Acute Care Choice:  Resumption of Svcs/PTA Provider Choice offered to:     DME Arranged:  Lightweight manual wheelchair with seat cushion DME Agency:  [place] Arranged:  RN, PT [facility] Agency:  [place]  Status of Service:  Completed, signed off  Medicare Important Message Given:    Date Medicare IM Given:    Medicare IM give by:    Date Additional Medicare IM Given:    Additional Medicare Important Message give by:     If discussed at [place] of Stay Meetings, dates discussed:    Additional Comments:  Zenon Mayo, RN 05/25/2015, 12:23 PM

## 2015-05-26 LAB — GLUCOSE, CAPILLARY
GLUCOSE-CAPILLARY: 141 mg/dL — AB (ref 65–99)
GLUCOSE-CAPILLARY: 166 mg/dL — AB (ref 65–99)
Glucose-Capillary: 246 mg/dL — ABNORMAL HIGH (ref 65–99)

## 2015-05-26 MED ORDER — AMOXICILLIN-POT CLAVULANATE 875-125 MG PO TABS
1.0000 | ORAL_TABLET | Freq: Two times a day (BID) | ORAL | Status: DC
Start: 2015-05-26 — End: 2015-06-18

## 2015-05-26 MED ORDER — OXYCODONE HCL 10 MG PO TABS: 10.0000 mg | ORAL_TABLET | Freq: Four times a day (QID) | ORAL | Status: DC | PRN

## 2015-05-26 MED ORDER — AMITRIPTYLINE HCL 25 MG PO TABS: 25.0000 mg | ORAL_TABLET | Freq: Every day | ORAL | Status: DC

## 2015-05-26 MED ORDER — HYDROMORPHONE HCL 2 MG PO TABS
2.0000 mg | ORAL_TABLET | ORAL | Status: DC | PRN
  Administered 2015-05-26 (×3): 2 mg via ORAL
  Filled 2015-05-26 (×3): qty 1

## 2015-05-26 MED ORDER — MUPIROCIN 2 % EX OINT
TOPICAL_OINTMENT | CUTANEOUS | Status: AC
  Filled 2015-05-26: qty 22

## 2015-05-26 NOTE — Progress Notes (Signed)
  Date: 05/26/2015  Patient name: [name]  Medical record number: [id number]  Date of birth: May 01, 1951   This patient has been seen and the plan of care was discussed with the house staff. Please see their note for complete details. I concur with their findings with the following additions/corrections: [name]'s foot looks better. She is on oral ABX to complete her course and F/U with Dr Sharol Given will be arranged. D/C home.  Bartholomew Crews, MD 05/26/2015, 2:44 PM

## 2015-05-26 NOTE — Care Management Note (Signed)
Case Management Note  Patient Details  Name: [name] MRN: [id number] Date of Birth: 1951-01-28  Subjective/Objective:  Patient is for dc today, [place] notified, she will resume HHRN, HHPT, has w/chair in her room.                    Action/Plan:   Expected Discharge Date:                  Expected Discharge Plan:  [place]  In-House Referral:     Discharge planning Services  CM Consult  Post Acute Care Choice:  Resumption of Svcs/PTA Provider Choice offered to:     DME Arranged:  Lightweight manual wheelchair with seat cushion DME Agency:  [place] Arranged:  RN, PT [facility] Agency:  [place]  Status of Service:  Completed, signed off  Medicare Important Message Given:  Yes Date Medicare IM Given:    Medicare IM give by:    Date Additional Medicare IM Given:    Additional Medicare Important Message give by:     If discussed at [place] of Stay Meetings, dates discussed:    Additional Comments:  Zenon Mayo, RN 05/26/2015, 12:11 PM

## 2015-05-26 NOTE — Progress Notes (Signed)
Reviewed discharge paperwork with pt.  Prescriptions given.  Right foot re-dressed.  Pt denied any needs at this time.  Pt taken to discharge location via wheelchair.

## 2015-05-26 NOTE — Progress Notes (Signed)
Physical Therapy Treatment Patient Details Name: [name] MRN: [id number] DOB: [birth year] Today's Date: 05/26/2015    History of Present Illness Patient is a 64 y.o. 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 Started on Vancomycin for wound infection. PMH includes DM, HTN, COPD,     PT Comments    Pt doing well with transfers to w/c. Pt understands importance of keeping RLE NWB.  Follow Up Recommendations  Home health PT     Equipment Recommendations  Wheelchair (measurements PT);Wheelchair cushion (measurements PT) (already received)    Recommendations for Other Services       Precautions / Restrictions Precautions Precautions: Fall Required Braces or Orthoses: Other Brace/Splint Other Brace/Splint: post op shoe present from prior admission? Restrictions Weight Bearing Restrictions: Yes RLE Weight Bearing: Non weight bearing    Mobility  Bed Mobility Overal bed mobility: Independent                Transfers Overall transfer level: Modified independent Equipment used: None Transfers: Sit to/from [facility] Sit to Stand: Modified independent (Device/Increase time) Stand pivot transfers: Modified independent (Device/Increase time)       General transfer comment: Demonstrated good technique  Ambulation/Gait                 Stairs            Wheelchair Mobility    Modified Rankin (Stroke Patients Only)       Balance Overall balance assessment: Needs assistance Sitting-balance support: No upper extremity supported;Feet supported Sitting balance-Leahy Scale: Normal     Standing balance support: No upper extremity supported Standing balance-Leahy Scale: Fair Standing balance comment: Pt able to stand facing bed with rt knee on bed for wt bearing through knee.                     Cognition Arousal/Alertness: Awake/alert Behavior During Therapy: WFL for tasks assessed/performed Overall Cognitive Status: Within Functional Limits for tasks assessed                      Exercises      General Comments General comments (skin integrity, edema, etc.): Adjusted pt's w/c and pt verbalized understanding of all parts of w/c      Pertinent Vitals/Pain Pain Assessment: 0-10 Faces Pain Scale: Hurts even more Pain Location: rt foot Pain Descriptors / Indicators: Burning Pain Intervention(s): Limited activity within patient's tolerance;Monitored during session    Home Living                      Prior Function            PT Goals (current goals can now be found in the care plan section) Acute Rehab PT Goals Patient Stated Goal: not go to rehab facility Progress towards PT goals: Goals met/education completed, patient discharged from PT    Frequency       PT Plan      Co-evaluation             End of Session   Activity Tolerance: Patient tolerated treatment well Patient left: in bed;with call bell/phone within reach     Time: 0941-0959 PT Time Calculation (min) (ACUTE ONLY): 18 min  Charges:  $Wheel Chair Management: 8-22 mins  G Codes:      MAYCOCK,CARY 05/26/2015, 11:20 AM Cary Maycock PT [phone]   

## 2015-05-26 NOTE — Discharge Instructions (Signed)
TAKE AUGMENTIN ONE PILL TWICE A DAY UNTIL YOU FINISH THE PILLS.  FOLLOW UP WITH DR. PATEL AND WITH DR. Sharol Given.  YOU CAN TAKE YOUR GABAPENTIN AND OXYCODONE FOR PAIN AS WRITTEN.  Nitroglycerin patch goes on ankle of right foot.

## 2015-05-26 NOTE — Progress Notes (Signed)
Pt.took off nitroglycerin patch  On her rt.foot bec.it's giving her headache.

## 2015-05-26 NOTE — Discharge Summary (Signed)
Name: [name] MRN: [id number] DOB: [birth year] 64 y.o. PCP: Julia Dubin, MD  Date of Admission: 05/22/2015  7:24 PM Date of Discharge: 05/26/2015 Attending Physician: Julia Crews, MD  Discharge Diagnosis: 1. Foot Infection 2. Type 2 Diabetes with Neuropathy  Discharge Medications:   Medication List    STOP taking these medications        doxycycline 100 MG tablet  Commonly known as:  VIBRA-TABS     oxyCODONE-acetaminophen 10-325 MG tablet  Commonly known as:  PERCOCET     senna-docusate 8.6-50 MG tablet  Commonly known as:  Senokot-S     sulfamethoxazole-trimethoprim 800-160 MG tablet  Commonly known as:  BACTRIM DS,SEPTRA DS      TAKE these medications        albuterol 108 (90 BASE) MCG/ACT inhaler  Commonly known as:  PROVENTIL HFA;VENTOLIN HFA  Inhale 1-2 puffs into the lungs every 6 (six) hours as needed for wheezing or shortness of breath.     amitriptyline 25 MG tablet  Commonly known as:  ELAVIL  Take 1 tablet (25 mg total) by mouth at bedtime.     amLODipine 5 MG tablet  Commonly known as:  NORVASC  Take 1 tablet (5 mg total) by mouth daily.     amoxicillin-clavulanate 875-125 MG tablet  Commonly known as:  AUGMENTIN  Take 1 tablet by mouth every 12 (twelve) hours.     aspirin 81 MG tablet  Take 81 mg by mouth every morning.     butalbital-acetaminophen-caffeine 50-325-40 MG tablet  Commonly known as:  FIORICET, ESGIC  take 1 tablet by mouth at bedtime     DULoxetine 60 MG capsule  Commonly known as:  CYMBALTA  Take 1 capsule (60 mg total) by mouth every morning.     gabapentin 600 MG tablet  Commonly known as:  NEURONTIN  Take 2 tablets (1,200 mg total) by mouth 3 (three) times daily.     glipiZIDE 10 MG 24 hr tablet  Commonly known as:  GLUCOTROL XL  Take 1 tablet (10 mg total) by mouth daily with supper.     lisinopril 40 MG tablet  Commonly known as:  PRINIVIL,ZESTRIL  take 1 tablet by mouth once daily     metFORMIN 750 MG 24 hr tablet  Commonly known as:  GLUCOPHAGE-XR  Take 2 tablets (1,500 mg total) by mouth daily with breakfast.     methocarbamol 500 MG tablet  Commonly known as:  ROBAXIN  Take 1 tablet (500 mg total) by mouth every 6 (six) hours as needed for muscle spasms.     mupirocin ointment 2 %  Commonly known as:  BACTROBAN  Place 1 application into the nose daily as needed. Apply to foot     nitroGLYCERIN 0.2 mg/hr patch  Commonly known as:  NITRODUR - Dosed in mg/24 hr  Place 1 patch (0.2 mg total) onto the skin daily. Applied to the front of ankle on the right change location daily.     Oxycodone HCl 10 MG Tabs  Take 1 tablet (10 mg total) by mouth every 6 (six) hours as needed for pain.     pantoprazole 20 MG tablet  Commonly known as:  PROTONIX  Take 1 tablet (20 mg total) by mouth daily.     rosuvastatin 40 MG tablet  Commonly known as:  CRESTOR  Take 1 tablet (40 mg total) by mouth daily.     sitaGLIPtin 100 MG tablet  Commonly known as:  JANUVIA  Take 1 tablet (100 mg total) by mouth daily.        Disposition and follow-up:   [name] was discharged from [facility] in Good condition.  At the hospital follow up visit please address:  1.  Her incision on right right foot will require inspection at a follow-up visit with [name] to determine if sutures need removal.  2.  Labs / imaging needed at time of follow-up: None  3.  Pending labs/ test needing follow-up: None  Follow-up Appointments:     Follow-up Information    Follow up with Julia V, MD In 1 week.   Specialty:  Orthopedic Surgery   Contact information:   [place] [address] [phone]       Follow up with [facility].   Why:  Resume HHRN, HHPT   Contact information:   [address] [place] [address] [phone]       Follow up with [facility].   Why:  wheel chair is in  patient's room    Contact information:   [address] [place] [address] [phone]       Follow up with Julia Rakes, MD On 05/27/2015.   Specialty:  Internal Medicine   Why:  1:15 PM   Contact information:   [place] [address] [phone]       Discharge Instructions: Discharge Instructions    Diet - low sodium heart healthy    Complete by:  As directed      Increase activity slowly    Complete by:  As directed           TAKE AUGMENTIN ONE PILL TWICE A DAY UNTIL YOU FINISH THE PILLS.  FOLLOW UP WITH DR. PATEL AND WITH [name].  YOU CAN TAKE YOUR GABAPENTIN AND OXYCODONE FOR PAIN AS WRITTEN.  Consultations: [name], Orthopedic Surgery  Procedures Performed:  Mr Foot Right W Wo Contrast  04/28/2015  CLINICAL DATA:  Ulcer under the right toe for greater than 3 years. No prior injury. Purulent drainage. EXAM: MRI OF THE RIGHT FOREFOOT WITHOUT AND WITH CONTRAST TECHNIQUE: Multiplanar, multisequence MR imaging was performed both before and after administration of intravenous contrast. CONTRAST:  20 mL MultiHance COMPARISON:  None. FINDINGS: There is patient motion degrading image quality limiting evaluation. There is mild marrow edema in the lateral hallux sesamoid with a transverse linear signal likely representing a fractured sesamoid. There is minimal marrow edema in the medial hallux sesamoid. There is a bipartite medial hallux sesamoid. There is no other focal marrow signal abnormality. There is no acute fracture or dislocation. There is a small first MTP joint effusion. There is no bone destruction or periosteal reaction. The alignment is anatomic. There is a ulcer along the plantar aspect of the first MTP joint with severe surrounding soft tissue edema with enhancement. There is a underlying irregularly marginated 8 x 22 x 11 mm fluid collection most consistent with an abscess. The flexor, extensor peroneal tendons are intact. There is  nonspecific soft tissue edema along the dorsal aspect of the midfoot likely reactive. IMPRESSION: 1. Soft tissue ulcer and cellulitis along the plantar aspect of the first MTP joint with a a 8 x 22 x 11 mm abscess deep to the ulceration. No evidence of osteomyelitis of the right forefoot. 2. Mild marrow edema in the lateral hallux sesamoid with a transverse linear signal likely  representing a fractured sesamoid. Electronically Signed   By: Kathreen Devoid   On: 04/28/2015 07:46   Dg Foot Complete Right  05/22/2015  CLINICAL DATA:  Right great toe amputation 11/18/ 16, infection with drainage possible osteomyelitis EXAM: RIGHT FOOT COMPLETE - 3+ VIEW COMPARISON:  None. FINDINGS: Three views of the right foot submitted. No acute fracture or subluxation. The patient is status post amputation of great toe and first metatarsal. There is no evidence of bony erosion of bone destruction to suggest osteomyelitis. Small posterior spur of calcaneus. 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 No definite evidence of osteomyelitis. No acute fracture or subluxation Electronically Signed   By: Lahoma Crocker M.D.   On: 05/22/2015 21:20    Admission HPI:  This is a 64 yo woman with HTN, T2DM, GERD, Depression and chronic diabetic foot ulcer who is here for right foot pain and drainage since Monday.  She was recently admitted to the hospital 11/14 to 11/20 for diabetic foot ulcer that did not respond to 3 days of clindamycin. MRI of the foot revealed abscess and surrounding cellultiis but no osteomyelitis. Orthopaedics was consulted who recommended amputation of first digit which was done on 11/18, then she was started on bactrim DS with a finish date of 11/27. Cultures had shown MSSA. She was seen in the clinic on 11/28 when the antibiotics were stopped.  She says that her right foot drainage and swelling started on Monday 12/5- then she called the home health nurse who evaluated it Tuesday morning  who asked her to go to Dr Sharol Flowers so she went Tuesday at 10:30 where the NP/PA put her on doxycycline and bactroban. She says she seemed to have a "water blister" which the NP drained. Since she has been on oral abx, pt says it has become progressively swollen, red and tender. She says she is having sharp burning 10/10 pain in all of her right digits which is different than her existing neuropathy. She says there was continual red-brown serosanguineous drainage from the site. She came in the ER for continued worsening.  Pt denies any systemic symptoms, no fevers or chills, or n/Flowers. She denies any trauma. She has not put weight on the site. She has been compliant with her oral meds.   In the ER, her vital signs were stable, she was afebrile with a normal white count and lactic acid. Xray did not show definitve evidence of osteomyelitis. She was started on vanc and zosyn.   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 The erythema and swelling around the incision continued to improve, even after transitioning to Augmentin. She continued to deny chills or night sweats and was afebrile. She was discharged on a 10-day total course of antibiotics, to complete the course at home on Augmentin. She continued to have severe pain while walking and could not bear weight on her feet. A wheelchair was provided on discharge after being evaluated by PT. It was thought much of her pain could be explained by worsening neuropathic pain from T2DM, but she was already on a maximal dose of Gabapentin. She was started on 25 mg amitriptyline daily.  Type 2 Diabetes with Neuropathy: Her fasting BGs were 120s-140s. She was managed with moderate SSI. She was discharged on her home medications.  Discharge  Vitals:   BP 115/46 mmHg  Pulse 63   Temp(Src) 98.1 F (36.7 C) (Oral)  Resp 18  Ht 5\' 6"  (1.676 m)  Wt 194 lb 7.1 oz (88.2 kg)  BMI 31.40 kg/m2  SpO2 95%  Discharge Labs:  Results for orders placed or performed during the hospital encounter of 05/22/15 (from the past 24 hour(s))  Glucose, capillary     Status: Abnormal   Collection Time: 05/25/15  5:08 PM  Result Value Ref Range   Glucose-Capillary 184 (H) 65 - 99 mg/dL  Glucose, capillary     Status: Abnormal   Collection Time: 05/25/15 10:15 PM  Result Value Ref Range   Glucose-Capillary 198 (H) 65 - 99 mg/dL  Glucose, capillary     Status: Abnormal   Collection Time: 05/26/15  8:14 AM  Result Value Ref Range   Glucose-Capillary 141 (H) 65 - 99 mg/dL  Glucose, capillary     Status: Abnormal   Collection Time: 05/26/15 12:07 PM  Result Value Ref Range   Glucose-Capillary 246 (H) 65 - 99 mg/dL    Signed: Liberty Handy, MD 05/26/2015, 1:28 PM   Equipment Ordered on Discharge: Wheelchair

## 2015-05-26 NOTE — Progress Notes (Signed)
OT Cancellation Note  Patient Details Name: [name] MRN: [id number] DOB: Dec 06, 1950   Cancelled Treatment:    Reason Eval/Treat Not Completed: Other (comment) Spoke with pt and she did not feel need to practice ADL tasks with OT. Moved well with PT earlier according to note.  Benito Mccreedy OTR/L [id number] 05/26/2015, 4:33 PM

## 2015-05-26 NOTE — Progress Notes (Signed)
Pharmacist Provided - Patient Medication Education Prior to Discharge   [name] is an 64 y.o. female who presented to [facility] on 05/22/2015 with a chief complaint of  Chief Complaint  Patient presents with  . Foot Pain     [x]  Patient will be discharged with new medications []  Patient being discharged without any new medications  The following medications were discussed with the patient:   Pain Control medications: []  Yes    [x]  No  Diabetes Medications: [x]  Yes    []  No  Heart Failure Medications: []  Yes    [x]  No  Anticoagulation Medications:  []  Yes    [x]  No  Antibiotics at discharge: [x]  Yes    []  No  Allergy Assessment Completed and Updated: []  Yes    [x]  No Identified Patient Allergies:  Allergies  Allergen Reactions  . Flexeril [Cyclobenzaprine] Other (See Comments)    Prolonged QTc to 571, tachycardia  . Soma [Carisoprodol] Itching and Rash     Medication Adherence Assessment: []  Excellent (no doses missed/week)      [x]  Good (1 dose missed/week)      []  Partial (2-3 doses missed/week)      []  Poor (>3 doses missed/week)  Barriers to Obtaining Medications: []  Yes [x]  No   Assessment: 64 y/o F with diabetic foot infection. Discussed discharge plan with patient and plan for going home on Augmentin to complete course of antibiotics. Also counseled patient on new start of amitriptyline for diabetic neuropathy. Discussed patients diabetes, patient appears educated and regularly checks blood sugar. Reinforced diabetes education. Pt endorsed understanding of treatment plan.   Time spent preparing for discharge counseling: 15 min Time spent counseling patient: 20 min  Angela Burke, PharmD Pharmacy Resident Pager: [phone] 05/26/2015, 3:51 PM

## 2015-05-26 NOTE — Progress Notes (Signed)
Subjective:  Reports that pain continues to be an issue, but denies fever, chills. She is tolerating her Augmentin.  Objective: Vital signs in last 24 hours: Filed Vitals:   05/24/15 2126 05/25/15 1342 05/25/15 2216 05/26/15 0519  BP: 137/74 126/66 135/94 115/46  Pulse: 71 76 54 63  Temp: 97.7 F (36.5 C) 97.9 F (36.6 C) 98 F (36.7 C) 98.1 F (36.7 C)  TempSrc: Oral Oral Oral Oral  Resp: 15 23 18 18   Height:      Weight:      SpO2: 95% 99% 97% 95%   Weight change:   Intake/Output Summary (Last 24 hours) at 05/26/15 0900 Last data filed at 05/25/15 2218  Gross per 24 hour  Intake    240 ml  Output   1100 ml  Net   -860 ml   General: Vital signs reviewed.  Patient is obese, in no acute distress and cooperative with exam.  Cardiovascular: RRR, S1 normal, S2 normal. Pulmonary/Chest: Scattered rhonchi, no wheezes or rales. Abdominal: Soft, non-tender, non-distended, BS + Musculoskeletal: Right first ray s/p amputation. Long incision with sutures along medial border of right foot after amputation. Erythema and edema are improved. Dried sanguinous fluid present along incision, no purulent drainage. Normal sensation of right foot, normal movement, intact pedal pulses.  Extremities: No lower extremity edema bilaterally, pulses symmetric and intact bilaterally. Neurological: Sensory intact to light touch bilaterally.   Lab Results: Basic Metabolic Panel:  Recent Labs Lab 05/22/15 1900  NA 139  K 4.7  CL 107  CO2 25  GLUCOSE 79  BUN 16  CREATININE 0.80  CALCIUM 9.8   Liver Function Tests:  Recent Labs Lab 05/22/15 1900  AST 22  ALT 29  ALKPHOS 59  BILITOT 0.8  PROT 6.8  ALBUMIN 3.7   CBC:  Recent Labs Lab 05/22/15 1900 05/23/15 0048  WBC 8.1 7.4  HGB 12.1 11.7*  HCT 39.2 37.8  MCV 98.2 98.4  PLT 216 194   CBG:  Recent Labs Lab 05/24/15 2125 05/25/15 0757 05/25/15 1202 05/25/15 1708 05/25/15 2215 05/26/15 0814  GLUCAP 163* 126* 245*  184* 198* 141*    Studies/Results: No results found. Medications:  I have reviewed the patient's current medications. Prior to Admission:  Prescriptions prior to admission  Medication Sig Dispense Refill Last Dose  . albuterol (PROVENTIL HFA;VENTOLIN HFA) 108 (90 BASE) MCG/ACT inhaler Inhale 1-2 puffs into the lungs every 6 (six) hours as needed for wheezing or shortness of breath. 1 Inhaler 3 Past Month at Unknown time  . amLODipine (NORVASC) 5 MG tablet Take 1 tablet (5 mg total) by mouth daily. 30 tablet 11 05/21/2015 at Unknown time  . aspirin 81 MG tablet Take 81 mg by mouth every morning.    05/22/2015 at Unknown time  . butalbital-acetaminophen-caffeine (FIORICET, ESGIC) 50-325-40 MG tablet take 1 tablet by mouth at bedtime 30 tablet 2 05/21/2015 at Unknown time  . doxycycline (VIBRA-TABS) 100 MG tablet Take 100 mg by mouth 2 (two) times daily.   05/22/2015 at Unknown time  . DULoxetine (CYMBALTA) 60 MG capsule Take 1 capsule (60 mg total) by mouth every morning. 90 capsule 3 05/22/2015 at Unknown time  . gabapentin (NEURONTIN) 600 MG tablet Take 2 tablets (1,200 mg total) by mouth 3 (three) times daily. 180 tablet 6 05/22/2015 at Unknown time  . glipiZIDE (GLUCOTROL XL) 10 MG 24 hr tablet Take 1 tablet (10 mg total) by mouth daily with supper. 90 tablet 3 05/21/2015 at Unknown  time  . lisinopril (PRINIVIL,ZESTRIL) 40 MG tablet take 1 tablet by mouth once daily (Patient taking differently: take 40 mg by mouth once daily) 90 tablet 3 05/22/2015 at Unknown time  . metFORMIN (GLUCOPHAGE-XR) 750 MG 24 hr tablet Take 2 tablets (1,500 mg total) by mouth daily with breakfast. 180 tablet 3 05/22/2015 at Unknown time  . methocarbamol (ROBAXIN) 500 MG tablet Take 1 tablet (500 mg total) by mouth every 6 (six) hours as needed for muscle spasms. 20 tablet 0 Past Week at Unknown time  . mupirocin ointment (BACTROBAN) 2 % Place 1 application into the nose daily as needed. Apply to foot   05/22/2015 at Unknown  time  . oxyCODONE (ROXICODONE) 15 MG immediate release tablet Take 1 tablet (15 mg total) by mouth every 4 (four) hours as needed for pain. 30 tablet 0 Past Week at Unknown time  . oxyCODONE-acetaminophen (PERCOCET) 10-325 MG tablet Take 1 tablet by mouth every 4 (four) hours as needed for pain.   05/22/2015 at Unknown time  . pantoprazole (PROTONIX) 20 MG tablet Take 1 tablet (20 mg total) by mouth daily. 90 tablet 3 05/22/2015 at Unknown time  . rosuvastatin (CRESTOR) 40 MG tablet Take 1 tablet (40 mg total) by mouth daily. 90 tablet 3 05/22/2015 at Unknown time  . sitaGLIPtin (JANUVIA) 100 MG tablet Take 1 tablet (100 mg total) by mouth daily. (Patient taking differently: Take 100 mg by mouth at bedtime. ) 90 tablet 3 05/21/2015 at Unknown time  . senna-docusate (SENOKOT-S) 8.6-50 MG tablet Take 2 tablets by mouth 2 (two) times daily. (Patient not taking: Reported on 05/22/2015) 30 tablet 1 Not Taking at Unknown time  . sulfamethoxazole-trimethoprim (BACTRIM DS,SEPTRA DS) 800-160 MG tablet Take 1 tablet by mouth every 12 (twelve) hours. (Patient not taking: Reported on 05/22/2015) 14 tablet 0 Not Taking at Unknown time   Scheduled Meds: . amitriptyline  25 mg Oral QHS  . amoxicillin-clavulanate  1 tablet Oral Q12H  . aspirin EC  81 mg Oral BH-q7a  . DULoxetine  60 mg Oral Daily  . feeding supplement (ENSURE ENLIVE)  237 mL Oral BID BM  . gabapentin  1,200 mg Oral TID  . heparin  5,000 Units Subcutaneous 3 times per day  . insulin aspart  0-15 Units Subcutaneous TID WC  . lisinopril  20 mg Oral Daily  . nitroGLYCERIN  0.2 mg Transdermal Daily  . pantoprazole  20 mg Oral Daily  . rosuvastatin  40 mg Oral Daily   Continuous Infusions:  PRN Meds:.acetaminophen, albuterol, diphenhydrAMINE, HYDROmorphone Assessment/Plan: Active Problems:   Type 2 diabetes, uncontrolled, with neuropathy (HCC)   Hypertension   Hyperlipidemia   Foot ulcer (HCC)   Foot pain, right   Foot abscess  Post-Surgical  Wound Infection: No fever, chills, leukocytosis. Still progressing well on po antibiotics. -Blood Cx 12/9 NTD -Continue augmentin for a total 10-day course of Abx, -Wheel chair arrived  HTN: BP remain normotensive off of medications. Patient is on amlodipine 10 mg daily and lisinopril 40 mg daily at home.  -Restart lisinopril at a lower dose 20 mg daily -Hold amlodipine  T2DM: Last HgbA1c of 7.   -SSI-M -Neurontin 1200 mg TID for neuropathy - Added amitriptyline 25 mg daily  HLD: Patient is on rosuvastatin 40 mg daily at home. -Continue Rosuvastatin 40 mg daily  Depression: Stable.  -Continue Cymbalta 60 mg daily  DVT/PE ppx: Heparin SQ FEN: CM diet Code: FULL  Dispo: Disposition is deferred at this time, awaiting improvement  of current medical problems.  Anticipated discharge in approximately 1-2 day(s).   The patient does have a current PCP (Rushil Sherrye Payor, MD) and does need an [facility] hospital follow-up appointment after discharge.  The patient does not have transportation limitations that hinder transportation to clinic appointments.   LOS: 3 days   Liberty Handy PGY-1 Internal Medicine Resident Pager # [phone]  05/26/2015 9:00 AM

## 2015-05-26 NOTE — Care Management Important Message (Signed)
Important Message  Patient Details  Name: [name] MRN: [id number] Date of Birth: [birth year]   Medicare Important Message Given:  Yes    [name] May 05/26/2015, 10:37 AM

## 2015-05-27 ENCOUNTER — Encounter: Payer: Self-pay | Admitting: Internal Medicine

## 2015-05-27 ENCOUNTER — Ambulatory Visit (INDEPENDENT_AMBULATORY_CARE_PROVIDER_SITE_OTHER): Payer: Medicare Other | Admitting: Internal Medicine

## 2015-05-27 VITALS — BP 122/61 | HR 79 | Temp 97.6°F | Ht 66.0 in | Wt 198.5 lb

## 2015-05-27 DIAGNOSIS — E118 Type 2 diabetes mellitus with unspecified complications: Secondary | ICD-10-CM

## 2015-05-27 DIAGNOSIS — I10 Essential (primary) hypertension: Secondary | ICD-10-CM | POA: Diagnosis not present

## 2015-05-27 DIAGNOSIS — E11621 Type 2 diabetes mellitus with foot ulcer: Secondary | ICD-10-CM

## 2015-05-27 DIAGNOSIS — E1142 Type 2 diabetes mellitus with diabetic polyneuropathy: Secondary | ICD-10-CM | POA: Diagnosis not present

## 2015-05-27 DIAGNOSIS — E663 Overweight: Secondary | ICD-10-CM | POA: Insufficient documentation

## 2015-05-27 DIAGNOSIS — Z79899 Other long term (current) drug therapy: Secondary | ICD-10-CM

## 2015-05-27 DIAGNOSIS — E1165 Type 2 diabetes mellitus with hyperglycemia: Secondary | ICD-10-CM | POA: Diagnosis not present

## 2015-05-27 DIAGNOSIS — E1140 Type 2 diabetes mellitus with diabetic neuropathy, unspecified: Secondary | ICD-10-CM | POA: Diagnosis not present

## 2015-05-27 DIAGNOSIS — Z7984 Long term (current) use of oral hypoglycemic drugs: Secondary | ICD-10-CM | POA: Diagnosis not present

## 2015-05-27 DIAGNOSIS — J449 Chronic obstructive pulmonary disease, unspecified: Secondary | ICD-10-CM | POA: Diagnosis not present

## 2015-05-27 DIAGNOSIS — Z4781 Encounter for orthopedic aftercare following surgical amputation: Secondary | ICD-10-CM | POA: Diagnosis not present

## 2015-05-27 DIAGNOSIS — F339 Major depressive disorder, recurrent, unspecified: Secondary | ICD-10-CM | POA: Diagnosis not present

## 2015-05-27 DIAGNOSIS — L97512 Non-pressure chronic ulcer of other part of right foot with fat layer exposed: Secondary | ICD-10-CM

## 2015-05-27 DIAGNOSIS — L97502 Non-pressure chronic ulcer of other part of unspecified foot with fat layer exposed: Secondary | ICD-10-CM

## 2015-05-27 LAB — GLUCOSE, CAPILLARY: GLUCOSE-CAPILLARY: 206 mg/dL — AB (ref 65–99)

## 2015-05-27 LAB — POCT GLYCOSYLATED HEMOGLOBIN (HGB A1C): Hemoglobin A1C: 6.4

## 2015-05-27 MED ORDER — GLUCOSE BLOOD VI STRP: ORAL_STRIP | Status: DC

## 2015-05-27 MED ORDER — GLUCERNA SHAKE PO LIQD: 237.0000 mL | Freq: Three times a day (TID) | ORAL | Status: DC

## 2015-05-27 NOTE — Assessment & Plan Note (Signed)
Overview At a recent hospitalization, she was started on amitriptyline 25 mg daily at bedtime since her pain was assessed to be related to underlying neuropathy.   Today, she reports to me her worsening pain was not related to poorly controlled neuropathy rather than active infection. She feels last night was the first time she was without pain.  Assessment Peripheral neuropathy in the setting of type 2 diabetes.  Plan Continue amitriptyline 25 mg daily at bedtime

## 2015-05-27 NOTE — Assessment & Plan Note (Signed)
Overview She was hospitalized 11/14 through 11/24 for right foot ulcer which warranted an indication of her first toe. Cultures were positive for MSSA and she was prescribed a seven-day course of Bactrim. She followed up in office on 11/28 and had completed her antibiotic therapy.   On 12/7, she was started on doxycycline and Bactroban by the NP/PA at her orthopedist office, but she developed worsening swelling, redness or tenderness. She was then hospitalized 12/9 through 12/13 and started on IV antibiotics for 48 hours before transitioning to Augmentin for a total 10 day course of antibiotics. Swelling and erythema around the incision site continued to improve, and her pain was assessed to be neuropathic in nature during her recent admission.  Since coming home last night, she feels she has adapted at home well. She is nonweightbearing of her right lower extremity as instructed though finds it difficult given the ongoing left knee osteoarthritis for which she was supposed to have a scheduled arthroplasty. At home, she is able to get around with a wheelchair and walker. She toilets herself using a bedside commode. Her daughter [name] has moved in with her to offer her help. Her home health nurse checked in this morning and will be following up twice weekly. Physical therapy is scheduled to call them this afternoon. She denies any falls since coming home last night.  After her appointment here today, she will go to the pharmacy to pick up her Augmentin prescription. She is scheduled to follow-up with Dr. Sharol Given on 12/21.  Assessment Diabetic foot infection status post right toe amputation. Physical exam findings suggestive of recovery.  Plan -Encouraged her to follow-up with Dr. Sharol Given on 12/21 -Encouraged her to go pick up her prescription of Augmentin today -Plan to follow-up in one month to see how she is doing

## 2015-05-27 NOTE — Assessment & Plan Note (Addendum)
Overview She has brought her glucometer with her today, and review of her log is notable for most readings trending low to mid 100s with occasional outliers in high 100s to mid 200s. She feels the Glucerna shakes which she was instructed to drink for nutrition to facilitate her postoperative recovery may be causing her to have intermittent hyperglycemia. She reports adherence to her diabetes medications: Glipizide 10 mg with supper, metformin extended release 1500 mg daily with breakfast, sitagliptin 100 mg.   Assessment Well controlled type 2 diabetes with neuropathy. Glycemic control is important given her recent procedure and infection. A1c today 6.4, improved from 7.0 back in September.  Plan -Refill test strips so that she is able to check her sugars 4 times daily -Instructed to call our clinic back if she is noting her blood sugars to be trending around 250 -Continue current medications: Sitagliptin 100 mg daily, metformin extended release 1500 mg daily, glipizide 10 mg daily -Prescribed Glucerna shakes to be taken 3 times daily between meals

## 2015-05-27 NOTE — Progress Notes (Signed)
   Subjective:    Patient ID: [name], female    DOB: 1950-08-26, 64 y.o.   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 Please see assessment & plan for status of chronic medical problems.      Review of Systems  Constitutional: Negative for fever, chills and diaphoresis.  Gastrointestinal: Positive for nausea. Negative for vomiting and abdominal pain.  Neurological: Negative for dizziness.       Objective:   Physical Exam  Constitutional: She is oriented to person, place, and time. She appears well-developed and well-nourished. No distress.  Elderly Caucasian female.  HENT:  Head: Normocephalic and atraumatic.  Eyes: Conjunctivae and EOM are normal. Pupils are equal, round, and reactive to light. No scleral icterus.  Neurological: She is alert and oriented to person, place, and time. No cranial nerve deficit.  Skin: Skin is warm and dry. She is not diaphoretic.  Right foot with sutures along side the length of the incision site at the medial surface. Dorsum with dry scaly skin.. No worsening erythema, warmth, swelling. No purulent drainage noted from the incision site as well.  Psychiatric: She has a normal mood and affect. Her behavior is normal.          Assessment & Plan:

## 2015-05-27 NOTE — Patient Instructions (Signed)
Please check your sugars 4 times daily: Once when you wake up and 3 times before meals. If your sugars are running around 250 for multiple days, please give [place] a call back.  Please drink Glucerna 3 times daily between meals help with healing her wound.  I look forward to seeing you back next month.

## 2015-05-28 LAB — CULTURE, BLOOD (ROUTINE X 2)
CULTURE: NO GROWTH
Culture: NO GROWTH

## 2015-05-29 NOTE — Progress Notes (Signed)
Internal Medicine Clinic Attending  Case discussed with Dr. Patel,Rushil at the time of the visit.  We reviewed the resident's history and exam and pertinent patient test results.  I agree with the assessment, diagnosis, and plan of care documented in the resident's note.  

## 2015-06-01 ENCOUNTER — Telehealth: Payer: Self-pay | Admitting: *Deleted

## 2015-06-01 DIAGNOSIS — J449 Chronic obstructive pulmonary disease, unspecified: Secondary | ICD-10-CM | POA: Diagnosis not present

## 2015-06-01 DIAGNOSIS — F339 Major depressive disorder, recurrent, unspecified: Secondary | ICD-10-CM | POA: Diagnosis not present

## 2015-06-01 DIAGNOSIS — E1140 Type 2 diabetes mellitus with diabetic neuropathy, unspecified: Secondary | ICD-10-CM | POA: Diagnosis not present

## 2015-06-01 DIAGNOSIS — Z4781 Encounter for orthopedic aftercare following surgical amputation: Secondary | ICD-10-CM | POA: Diagnosis not present

## 2015-06-01 DIAGNOSIS — E1165 Type 2 diabetes mellitus with hyperglycemia: Secondary | ICD-10-CM | POA: Diagnosis not present

## 2015-06-01 DIAGNOSIS — I10 Essential (primary) hypertension: Secondary | ICD-10-CM | POA: Diagnosis not present

## 2015-06-01 MED ORDER — FLUCONAZOLE 150 MG PO TABS: 150.0000 mg | ORAL_TABLET | ORAL | Status: DC

## 2015-06-01 NOTE — Telephone Encounter (Signed)
I have prescribed Diflucan 150mg  to be taken once every 72 hours for a complicated infection for a total of 3 doses.

## 2015-06-01 NOTE — Telephone Encounter (Signed)
Pt on abx has vag yeast symptoms x 3 days, itching, redness, white disch. [facility] calls and request prescription diflucan 150mg  for pt #3

## 2015-06-04 DIAGNOSIS — E1165 Type 2 diabetes mellitus with hyperglycemia: Secondary | ICD-10-CM | POA: Diagnosis not present

## 2015-06-04 DIAGNOSIS — I10 Essential (primary) hypertension: Secondary | ICD-10-CM | POA: Diagnosis not present

## 2015-06-04 DIAGNOSIS — E1140 Type 2 diabetes mellitus with diabetic neuropathy, unspecified: Secondary | ICD-10-CM | POA: Diagnosis not present

## 2015-06-04 DIAGNOSIS — Z4781 Encounter for orthopedic aftercare following surgical amputation: Secondary | ICD-10-CM | POA: Diagnosis not present

## 2015-06-04 DIAGNOSIS — J449 Chronic obstructive pulmonary disease, unspecified: Secondary | ICD-10-CM | POA: Diagnosis not present

## 2015-06-04 DIAGNOSIS — F339 Major depressive disorder, recurrent, unspecified: Secondary | ICD-10-CM | POA: Diagnosis not present

## 2015-06-08 DIAGNOSIS — F339 Major depressive disorder, recurrent, unspecified: Secondary | ICD-10-CM | POA: Diagnosis not present

## 2015-06-08 DIAGNOSIS — J449 Chronic obstructive pulmonary disease, unspecified: Secondary | ICD-10-CM | POA: Diagnosis not present

## 2015-06-08 DIAGNOSIS — E1140 Type 2 diabetes mellitus with diabetic neuropathy, unspecified: Secondary | ICD-10-CM | POA: Diagnosis not present

## 2015-06-08 DIAGNOSIS — I10 Essential (primary) hypertension: Secondary | ICD-10-CM | POA: Diagnosis not present

## 2015-06-08 DIAGNOSIS — E1165 Type 2 diabetes mellitus with hyperglycemia: Secondary | ICD-10-CM | POA: Diagnosis not present

## 2015-06-08 DIAGNOSIS — Z4781 Encounter for orthopedic aftercare following surgical amputation: Secondary | ICD-10-CM | POA: Diagnosis not present

## 2015-06-11 DIAGNOSIS — F339 Major depressive disorder, recurrent, unspecified: Secondary | ICD-10-CM | POA: Diagnosis not present

## 2015-06-11 DIAGNOSIS — I10 Essential (primary) hypertension: Secondary | ICD-10-CM | POA: Diagnosis not present

## 2015-06-11 DIAGNOSIS — E1140 Type 2 diabetes mellitus with diabetic neuropathy, unspecified: Secondary | ICD-10-CM | POA: Diagnosis not present

## 2015-06-11 DIAGNOSIS — E1165 Type 2 diabetes mellitus with hyperglycemia: Secondary | ICD-10-CM | POA: Diagnosis not present

## 2015-06-11 DIAGNOSIS — Z4781 Encounter for orthopedic aftercare following surgical amputation: Secondary | ICD-10-CM | POA: Diagnosis not present

## 2015-06-11 DIAGNOSIS — J449 Chronic obstructive pulmonary disease, unspecified: Secondary | ICD-10-CM | POA: Diagnosis not present

## 2015-06-12 ENCOUNTER — Other Ambulatory Visit: Payer: Self-pay | Admitting: Internal Medicine

## 2015-06-15 DIAGNOSIS — E1165 Type 2 diabetes mellitus with hyperglycemia: Secondary | ICD-10-CM | POA: Diagnosis not present

## 2015-06-15 DIAGNOSIS — E1140 Type 2 diabetes mellitus with diabetic neuropathy, unspecified: Secondary | ICD-10-CM | POA: Diagnosis not present

## 2015-06-15 DIAGNOSIS — I10 Essential (primary) hypertension: Secondary | ICD-10-CM | POA: Diagnosis not present

## 2015-06-15 DIAGNOSIS — F339 Major depressive disorder, recurrent, unspecified: Secondary | ICD-10-CM | POA: Diagnosis not present

## 2015-06-15 DIAGNOSIS — J449 Chronic obstructive pulmonary disease, unspecified: Secondary | ICD-10-CM | POA: Diagnosis not present

## 2015-06-15 DIAGNOSIS — Z4781 Encounter for orthopedic aftercare following surgical amputation: Secondary | ICD-10-CM | POA: Diagnosis not present

## 2015-06-16 ENCOUNTER — Telehealth: Payer: Self-pay | Admitting: Internal Medicine

## 2015-06-16 NOTE — Telephone Encounter (Signed)
Spoke with Olivia Mackie, she reports seeing patient Sunday and pt reporting to her falling x2.  No injury, fall occurred with episode of dizziness both times.  Olivia Mackie wanted to let PCP know and also that she had encouraged pt to be seen for symptoms of congested cough, sometimes productive of yellow/green sputum.  Vitals WNL and chest clear to auscultation other than faint expiratory wheezes.  At this time patient has not called clinic for an appointment.  Olivia Mackie was to follow up with patient today.   She is scheduled to be seen by you on 1/20, would you like me to get her in sooner?

## 2015-06-16 NOTE — Telephone Encounter (Signed)
[name] from [facility] requesting the nurse to call back regarding pt.  Please call back.

## 2015-06-17 NOTE — Telephone Encounter (Signed)
Spoke with pt, she does report falling twice on Sunday, was not dizzy, and didn't trip.  Actually reports standing still both times.  Has abrasions to both elbows and 1 knee, also hit her head on her entertainment center. Pt reporting congestion, both nasal and in chest.  Reports coughing up green sputum.  Pt scheduled to see Dr. Randell Patient tomorrow at 1:45.    FYI Dr. Posey Pronto

## 2015-06-17 NOTE — Telephone Encounter (Signed)
As this patient was seen by me on 05/27/15 and deemed necessary for their treatment as documented in the EHR, I will refill this prescription for lisinopril.

## 2015-06-17 NOTE — Telephone Encounter (Signed)
Yes, please do have her come in to be seen sooner with another provider in clinic if possible.

## 2015-06-18 ENCOUNTER — Encounter: Payer: Self-pay | Admitting: Pulmonary Disease

## 2015-06-18 ENCOUNTER — Ambulatory Visit (HOSPITAL_COMMUNITY)
Admission: RE | Admit: 2015-06-18 | Discharge: 2015-06-18 | Disposition: A | Discharge status: Home / Self Care | Payer: Medicare Other | Source: Ambulatory Visit | Attending: Internal Medicine | Admitting: Internal Medicine

## 2015-06-18 ENCOUNTER — Ambulatory Visit (INDEPENDENT_AMBULATORY_CARE_PROVIDER_SITE_OTHER): Payer: Medicare Other | Admitting: Pulmonary Disease

## 2015-06-18 VITALS — BP 113/70 | HR 122 | Temp 98.1°F | Ht 66.5 in

## 2015-06-18 DIAGNOSIS — Z9181 History of falling: Secondary | ICD-10-CM | POA: Diagnosis not present

## 2015-06-18 DIAGNOSIS — J3489 Other specified disorders of nose and nasal sinuses: Secondary | ICD-10-CM

## 2015-06-18 DIAGNOSIS — R296 Repeated falls: Secondary | ICD-10-CM

## 2015-06-18 DIAGNOSIS — W19XXXA Unspecified fall, initial encounter: Secondary | ICD-10-CM

## 2015-06-18 DIAGNOSIS — J329 Chronic sinusitis, unspecified: Secondary | ICD-10-CM

## 2015-06-18 DIAGNOSIS — R000 Tachycardia, unspecified: Secondary | ICD-10-CM | POA: Insufficient documentation

## 2015-06-18 MED ORDER — FLUTICASONE PROPIONATE 50 MCG/ACT NA SUSP: 2.0000 | Freq: Every day | NASAL | Status: DC

## 2015-06-18 NOTE — Progress Notes (Signed)
Subjective:    Patient ID: [name], female    DOB: 12-Feb-1951, 65 y.o.   MRN: [id number]  HPI Ms. Emillie Holding is a 65 year old woman with history of HTN, DM2, COPD, depression presenting for follow up of falls.  Fell twice on Sunday. None since. She was standing at the time. [place] forward the first time around 11AM(stood up from chair, took about 3 steps and was standing for 1 minute). [place] backwards the second (was putting on coat) around 7:30PM. +head trauma. Denies LOC. No preceding symptoms. Remembers events. No incontinence afterwards. Didn't feel like knees gave out. Hasn't taken any oxycodone for 2 weeks. Has not had Fioricet for a week. CBG was within normal limits.  Review of Systems Constitutional: no fevers/chills Eyes: no vision changes Ears, nose, mouth, throat, and face: +cough Respiratory: +baseline occasional shortness of breath Cardiovascular: no chest pain Gastrointestinal: no nausea/vomiting, no abdominal pain, no constipation, no diarrhea Genitourinary: no dysuria, no hematuria  Past Medical History  Diagnosis Date  . Hypertension   . Diabetes mellitus without complication ([place])     diagnosed around 2010, only ever on metformin  . Chronic pain     neck pain, headache, neuropathy  . Hypertriglyceridemia   . Neuromuscular disorder ([place])   . COPD (chronic obstructive pulmonary disease) ([place])   . Depression   . Puncture wound of foot, right 05/17/2012    Tetanus shot 3 yrs ago at [facility] in [place], per pt report   . Brachial plexus disorders     Current Outpatient Prescriptions on File Prior to Visit  Medication Sig Dispense Refill  . albuterol (PROVENTIL HFA;VENTOLIN HFA) 108 (90 BASE) MCG/ACT inhaler Inhale 1-2 puffs into the lungs every 6 (six) hours as needed for wheezing or shortness of breath. 1 Inhaler 3  . amitriptyline (ELAVIL) 25 MG tablet Take 1 tablet (25 mg total) by mouth at bedtime. 30 tablet 11  . amLODipine (NORVASC) 5 MG tablet  Take 1 tablet (5 mg total) by mouth daily. 30 tablet 11  . amoxicillin-clavulanate (AUGMENTIN) 875-125 MG tablet Take 1 tablet by mouth every 12 (twelve) hours. 13 tablet 0  . aspirin 81 MG tablet Take 81 mg by mouth every morning.     . butalbital-acetaminophen-caffeine (FIORICET, ESGIC) 50-325-40 MG tablet take 1 tablet by mouth at bedtime 30 tablet 2  . DULoxetine (CYMBALTA) 60 MG capsule Take 1 capsule (60 mg total) by mouth every morning. 90 capsule 3  . feeding supplement, GLUCERNA SHAKE, (GLUCERNA SHAKE) LIQD Take 237 mLs by mouth 3 (three) times daily between meals. 90 Can 5  . fluconazole (DIFLUCAN) 150 MG tablet Take 1 tablet (150 mg total) by mouth every 3 (three) days. 3 tablet 0  . gabapentin (NEURONTIN) 600 MG tablet Take 2 tablets (1,200 mg total) by mouth 3 (three) times daily. 180 tablet 6  . glipiZIDE (GLUCOTROL XL) 10 MG 24 hr tablet Take 1 tablet (10 mg total) by mouth daily with supper. 90 tablet 3  . glucose blood (GLUCOSE METER TEST) test strip Use as instructed 100 each 12  . lisinopril (PRINIVIL,ZESTRIL) 40 MG tablet take 1 tablet by mouth once daily 90 tablet 3  . metFORMIN (GLUCOPHAGE-XR) 750 MG 24 hr tablet Take 2 tablets (1,500 mg total) by mouth daily with breakfast. 180 tablet 3  . methocarbamol (ROBAXIN) 500 MG tablet Take 1 tablet (500 mg total) by mouth every 6 (six) hours as needed for muscle spasms. 20 tablet 0  . mupirocin ointment (BACTROBAN)  2 % Place 1 application into the nose daily as needed. Apply to foot    . nitroGLYCERIN (NITRODUR - DOSED IN MG/24 HR) 0.2 mg/hr patch Place 1 patch (0.2 mg total) onto the skin daily. Applied to the front of ankle on the right change location daily. 30 patch 12  . oxyCODONE 10 MG TABS Take 1 tablet (10 mg total) by mouth every 6 (six) hours as needed for pain. 24 tablet 0  . pantoprazole (PROTONIX) 20 MG tablet Take 1 tablet (20 mg total) by mouth daily. 90 tablet 3  . rosuvastatin (CRESTOR) 40 MG tablet Take 1 tablet (40  mg total) by mouth daily. 90 tablet 3  . sitaGLIPtin (JANUVIA) 100 MG tablet Take 1 tablet (100 mg total) by mouth daily. (Patient taking differently: Take 100 mg by mouth at bedtime. ) 90 tablet 3   No current facility-administered medications on file prior to visit.    Today's Vitals   06/18/15 1407  BP: 113/70  Pulse: 122  Temp: 98.1 F (36.7 C)  TempSrc: Oral  Height: 5' 6.5" (1.689 m)  SpO2: 99%   Objective:   Physical Exam  Constitutional: She is oriented to person, place, and time. She appears well-developed and well-nourished. No distress.  HENT:  Head: Normocephalic and atraumatic.  Mouth/Throat: Oropharynx is clear and moist.  Eyes: Conjunctivae and EOM are normal. Pupils are equal, round, and reactive to light.  Neck: Neck supple.  Cardiovascular: Regular rhythm.   Tachycardic  Pulmonary/Chest: Effort normal. No respiratory distress. She has wheezes (occasional). She has no rales.  Musculoskeletal: Normal range of motion.  Neurological: She is alert and oriented to person, place, and time. No cranial nerve deficit. Coordination normal.  Skin: Skin is warm and dry. She is not diaphoretic.  Psychiatric: She has a normal mood and affect.   Assessment & Plan:  Please refer to problem based charting.

## 2015-06-18 NOTE — Patient Instructions (Addendum)
Stop taking amitriptyline.

## 2015-06-19 DIAGNOSIS — J329 Chronic sinusitis, unspecified: Secondary | ICD-10-CM | POA: Insufficient documentation

## 2015-06-19 NOTE — Assessment & Plan Note (Signed)
Assessment: Doubt syncope as she remembers the events. Possible polypharmacy. She is on a lot of sedating medications. Does not appear to have any bony abnormalities or focal deficits on exam. She is tacyhcardic on exam today. Her EKG has sinus tachycardia with T wave inversions in lateral leads that are on previous EKG.  Plan:  -Discontinue amitriptyline. May consider replacing with nortriptyline - will defer to PCP -Continue to monitor

## 2015-06-19 NOTE — Assessment & Plan Note (Signed)
Assessment: She reports nasal congestion and sinus pressure. She has rhinorrhea and postnasal drip. Her cough is productive of green mucous. Denies fevers or chills. Likely URI or acute sinusitis.  Plan: -Fluticasone 61mcg/act nasal spray 2 sprays in both nostrils daily. She can reduce to 1 spray per nostril once day when her symptoms have improved.

## 2015-06-20 NOTE — Progress Notes (Signed)
Case discussed with Dr. Krall soon after the resident saw the patient.  We reviewed the resident's history and exam and pertinent patient test results.  I agree with the assessment, diagnosis, and plan of care documented in the resident's note. 

## 2015-06-25 DIAGNOSIS — Z4781 Encounter for orthopedic aftercare following surgical amputation: Secondary | ICD-10-CM | POA: Diagnosis not present

## 2015-06-25 DIAGNOSIS — E1165 Type 2 diabetes mellitus with hyperglycemia: Secondary | ICD-10-CM | POA: Diagnosis not present

## 2015-06-25 DIAGNOSIS — F339 Major depressive disorder, recurrent, unspecified: Secondary | ICD-10-CM | POA: Diagnosis not present

## 2015-06-25 DIAGNOSIS — J449 Chronic obstructive pulmonary disease, unspecified: Secondary | ICD-10-CM | POA: Diagnosis not present

## 2015-06-25 DIAGNOSIS — I10 Essential (primary) hypertension: Secondary | ICD-10-CM | POA: Diagnosis not present

## 2015-06-25 DIAGNOSIS — E1140 Type 2 diabetes mellitus with diabetic neuropathy, unspecified: Secondary | ICD-10-CM | POA: Diagnosis not present

## 2015-07-01 DIAGNOSIS — F339 Major depressive disorder, recurrent, unspecified: Secondary | ICD-10-CM | POA: Diagnosis not present

## 2015-07-01 DIAGNOSIS — I10 Essential (primary) hypertension: Secondary | ICD-10-CM | POA: Diagnosis not present

## 2015-07-01 DIAGNOSIS — Z4781 Encounter for orthopedic aftercare following surgical amputation: Secondary | ICD-10-CM | POA: Diagnosis not present

## 2015-07-01 DIAGNOSIS — J449 Chronic obstructive pulmonary disease, unspecified: Secondary | ICD-10-CM | POA: Diagnosis not present

## 2015-07-01 DIAGNOSIS — E1140 Type 2 diabetes mellitus with diabetic neuropathy, unspecified: Secondary | ICD-10-CM | POA: Diagnosis not present

## 2015-07-01 DIAGNOSIS — E1165 Type 2 diabetes mellitus with hyperglycemia: Secondary | ICD-10-CM | POA: Diagnosis not present

## 2015-07-03 ENCOUNTER — Encounter: Payer: Medicare Other | Admitting: Internal Medicine

## 2015-07-10 ENCOUNTER — Ambulatory Visit (INDEPENDENT_AMBULATORY_CARE_PROVIDER_SITE_OTHER): Payer: Medicare Other | Admitting: Internal Medicine

## 2015-07-10 ENCOUNTER — Encounter: Payer: Self-pay | Admitting: Internal Medicine

## 2015-07-10 VITALS — BP 133/75 | HR 97 | Temp 98.3°F | Ht 66.5 in | Wt 197.2 lb

## 2015-07-10 DIAGNOSIS — G44221 Chronic tension-type headache, intractable: Secondary | ICD-10-CM

## 2015-07-10 DIAGNOSIS — M170 Bilateral primary osteoarthritis of knee: Secondary | ICD-10-CM | POA: Diagnosis not present

## 2015-07-10 DIAGNOSIS — E785 Hyperlipidemia, unspecified: Secondary | ICD-10-CM | POA: Diagnosis not present

## 2015-07-10 DIAGNOSIS — E118 Type 2 diabetes mellitus with unspecified complications: Secondary | ICD-10-CM

## 2015-07-10 DIAGNOSIS — M1712 Unilateral primary osteoarthritis, left knee: Secondary | ICD-10-CM

## 2015-07-10 DIAGNOSIS — Z7984 Long term (current) use of oral hypoglycemic drugs: Secondary | ICD-10-CM | POA: Diagnosis not present

## 2015-07-10 DIAGNOSIS — E11649 Type 2 diabetes mellitus with hypoglycemia without coma: Secondary | ICD-10-CM | POA: Diagnosis not present

## 2015-07-10 DIAGNOSIS — R51 Headache: Secondary | ICD-10-CM

## 2015-07-10 DIAGNOSIS — Z1231 Encounter for screening mammogram for malignant neoplasm of breast: Secondary | ICD-10-CM

## 2015-07-10 LAB — GLUCOSE, CAPILLARY
GLUCOSE-CAPILLARY: 68 mg/dL (ref 65–99)
GLUCOSE-CAPILLARY: 80 mg/dL (ref 65–99)

## 2015-07-10 MED ORDER — ROSUVASTATIN CALCIUM 40 MG PO TABS: 40.0000 mg | ORAL_TABLET | Freq: Every day | ORAL | Status: DC

## 2015-07-10 MED ORDER — BUTALBITAL-APAP-CAFFEINE 50-325-40 MG PO TABS: 1.0000 | ORAL_TABLET | Freq: Every day | ORAL | Status: DC

## 2015-07-10 NOTE — Assessment & Plan Note (Signed)
Overview She is requesting a refill her Fioricet today. She reports taking 1 tablet at bedtime as it helps her sleep. She has tried prophylactic medications like Topamax without much relief.  Assessment Chronic tension headaches in the setting of muscle spasms from prior injury.  Plan Refilled Fioricet 30 tablets

## 2015-07-10 NOTE — Patient Instructions (Signed)
I have refilled your Fioricet and Crestor. If you notice your sugar is running below 70, please give [place] a call back.  [name] will give a call about your preventative health appointment where we can talk more in-depth about goals of care.

## 2015-07-10 NOTE — Assessment & Plan Note (Signed)
Overview Due to her recent foot infection in toe amputation, her knee surgery rescheduled. She is due to follow-up with orthopedist on February 1. She suspects that her knee pain is what caused her to fall when she was last seen in clinic last month by Dr. Randell Patient. She denies any falls in the interval since.  Assessment Primary osteoarthritis of left knee with planned surgery.  Plan Continue reassessing in each visit

## 2015-07-10 NOTE — Assessment & Plan Note (Addendum)
Overview In reviewing the medication she is brought today for reconciliation, she did not bring a bottle of Crestor with her. She reports that she ran out of the medication and has not taken it since it was never refilled.  Assessment Hyperlipidemia in the setting of type 2 diabetes initially treated with statin therapy.  Plan Refilled Crestor 40 mg daily today and requested the patient call in for refills when she runs out of her medication to which she acknowledged understanding

## 2015-07-10 NOTE — Progress Notes (Signed)
   Subjective:    Patient ID: [name], female    DOB: 1950/08/19, 65 y.o.   MRN: [id number]  HPI 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 Please see assessment & plan for status of chronic medical problems  She is agreeable to being scheduled to have her mammogram done. She was also requesting podiatry referral for diabetic shoes though has been told by nursing staff she needs to ask for prescription from Dr. Jess Barters office since they do not recommend wearing shoes immediately after toe amputation.  She is interested in discussing advanced directives at a Medicare wellness visit.  Review of Systems  Respiratory: Negative for shortness of breath.   Cardiovascular: Negative for chest pain and leg swelling.  Gastrointestinal: Negative for nausea, vomiting, abdominal pain, diarrhea and blood in stool.  Musculoskeletal: Positive for joint swelling (Knee pain).  Neurological: Positive for headaches. Negative for dizziness.       Objective:   Physical Exam  Constitutional: She is oriented to person, place, and time. She appears well-developed and well-nourished. No distress.  Elderly Caucasian female.  HENT:  Head: Normocephalic and atraumatic.  Eyes: Conjunctivae and EOM are normal. Pupils are equal, round, and reactive to light. No scleral icterus.  Neurological: She is alert and oriented to person, place, and time. No cranial nerve deficit.  Skin: Skin is warm and dry. She is not diaphoretic.  Well healing wound of the right medial foot. No signs of warmth, drainage, increased swelling.  Psychiatric: She has a normal mood and affect. Her behavior is normal.          Assessment & Plan:

## 2015-07-10 NOTE — Assessment & Plan Note (Addendum)
Overview She has brought her glucometer today with her, and review of her glucometer log is notable for mostly values trending in the low to mid 100s. She does have occasional outliers with values in the low 200s and one value as low as 68. When she is hypoglycemic, she reports shaking, and CBG in the office today 68 though she did not feel any symptoms. She does keep something in her purse to help her when her sugar does drop. She does acknowledge disorder eating as she only eats dinner while drinking Glucerna shakes throughout the day, mostly lunch and before dinner and rarely breakfast.  Assessment Type 2 diabetes with complications controlled with medication.  Plan Continue metformin XR 1500 mg, sitagliptin 100 mg, glipizide 10 mg though can consider GLP-1 or SGLT2 inhibitor in the future should she have persistent hypoglycemia

## 2015-07-13 NOTE — Progress Notes (Signed)
Internal Medicine Clinic Attending  Case discussed with Dr. Patel,Rushil soon after the resident saw the patient.  We reviewed the resident's history and exam and pertinent patient test results.  I agree with the assessment, diagnosis, and plan of care documented in the resident's note. 

## 2015-07-15 DIAGNOSIS — M1712 Unilateral primary osteoarthritis, left knee: Secondary | ICD-10-CM | POA: Diagnosis not present

## 2015-07-28 ENCOUNTER — Telehealth: Payer: Self-pay | Admitting: Internal Medicine

## 2015-07-28 ENCOUNTER — Ambulatory Visit
Admission: RE | Admit: 2015-07-28 | Discharge: 2015-07-28 | Disposition: A | Discharge status: Home / Self Care | Payer: Medicare Other | Source: Ambulatory Visit | Attending: Internal Medicine | Admitting: Internal Medicine

## 2015-07-28 DIAGNOSIS — Z1231 Encounter for screening mammogram for malignant neoplasm of breast: Secondary | ICD-10-CM | POA: Diagnosis not present

## 2015-07-28 NOTE — Telephone Encounter (Signed)
Pt called to see if papers she dropped off for shoes has been completed, transferred call to [facility]

## 2015-08-03 ENCOUNTER — Telehealth: Payer: Self-pay | Admitting: Internal Medicine

## 2015-08-03 NOTE — Telephone Encounter (Signed)
APPT REMINDER CALL, LMTCB IF SHE NEEDS TO CANCEL °

## 2015-08-04 ENCOUNTER — Ambulatory Visit (INDEPENDENT_AMBULATORY_CARE_PROVIDER_SITE_OTHER): Payer: Medicare Other | Admitting: Internal Medicine

## 2015-08-04 VITALS — BP 100/61 | HR 85 | Temp 97.7°F | Ht 66.5 in | Wt 195.5 lb

## 2015-08-04 DIAGNOSIS — E785 Hyperlipidemia, unspecified: Secondary | ICD-10-CM | POA: Diagnosis not present

## 2015-08-04 DIAGNOSIS — Z7984 Long term (current) use of oral hypoglycemic drugs: Secondary | ICD-10-CM

## 2015-08-04 DIAGNOSIS — E1142 Type 2 diabetes mellitus with diabetic polyneuropathy: Secondary | ICD-10-CM | POA: Diagnosis not present

## 2015-08-04 DIAGNOSIS — Z87891 Personal history of nicotine dependence: Secondary | ICD-10-CM | POA: Diagnosis not present

## 2015-08-04 DIAGNOSIS — E118 Type 2 diabetes mellitus with unspecified complications: Secondary | ICD-10-CM

## 2015-08-04 MED ORDER — GLUCOSE BLOOD VI STRP: ORAL_STRIP | Status: DC

## 2015-08-04 NOTE — Assessment & Plan Note (Signed)
A: Type 2 Diabetes mellitus controlled with peripheral neuropathy without long term insulin use  P: - Continue Metformin, Glipizide and Januvia - Reordered test strips.

## 2015-08-04 NOTE — Progress Notes (Signed)
[facility] Subjective:   Patient ID: [name] female   DOB: 10/03/1950 65 y.o.   MRN: [id number]  HPI: [name] is a 65 y.o. female with a PMH detailed below who presents for evaluation for diabetic shoes.  She has been wearing flip flops for many years, after her foot ulcer and amputation last year she realized that she needs better footwear.  She is taking her diabetic medication as directed and has not had any hypoglycemia.  She is testing 2-3 times a day.    Past Medical History  Diagnosis Date  . Hypertension   . Diabetes mellitus without complication ([place])     diagnosed around 2010, only ever on metformin  . Chronic pain     neck pain, headache, neuropathy  . Hypertriglyceridemia   . Neuromuscular disorder ([place])   . COPD (chronic obstructive pulmonary disease) ([place])   . Depression   . Puncture wound of foot, right 05/17/2012    Tetanus shot 3 yrs ago at [facility] in [place], per pt report   . Brachial plexus disorders    Current Outpatient Prescriptions  Medication Sig Dispense Refill  . albuterol (PROVENTIL HFA;VENTOLIN HFA) 108 (90 BASE) MCG/ACT inhaler Inhale 1-2 puffs into the lungs every 6 (six) hours as needed for wheezing or shortness of breath. 1 Inhaler 3  . amLODipine (NORVASC) 5 MG tablet Take 1 tablet (5 mg total) by mouth daily. 30 tablet 11  . aspirin 81 MG tablet Take 81 mg by mouth every morning.     . butalbital-acetaminophen-caffeine (FIORICET, ESGIC) 50-325-40 MG tablet Take 1 tablet by mouth at bedtime. 30 tablet 0  . DULoxetine (CYMBALTA) 60 MG capsule Take 1 capsule (60 mg total) by mouth every morning. 90 capsule 3  . feeding supplement, GLUCERNA SHAKE, (GLUCERNA SHAKE) LIQD Take 237 mLs by mouth 3 (three) times daily between meals. 90 Can 5  . fluticasone (FLONASE) 50 MCG/ACT nasal spray Place 2 sprays into both nostrils daily. Once symptoms are controlled, may reduce dosage to 1 spray per nostril once daily.  9.9 g 0  . gabapentin (NEURONTIN) 600 MG tablet Take 2 tablets (1,200 mg total) by mouth 3 (three) times daily. 180 tablet 6  . glipiZIDE (GLUCOTROL XL) 10 MG 24 hr tablet Take 1 tablet (10 mg total) by mouth daily with supper. 90 tablet 3  . glucose blood (GLUCOSE METER TEST) test strip Use as instructed 100 each 12  . lisinopril (PRINIVIL,ZESTRIL) 40 MG tablet take 1 tablet by mouth once daily 90 tablet 3  . metFORMIN (GLUCOPHAGE-XR) 750 MG 24 hr tablet Take 2 tablets (1,500 mg total) by mouth daily with breakfast. 180 tablet 3  . mupirocin ointment (BACTROBAN) 2 % Place 1 application into the nose daily as needed. Apply to foot    . nitroGLYCERIN (NITRODUR - DOSED IN MG/24 HR) 0.2 mg/hr patch Place 1 patch (0.2 mg total) onto the skin daily. Applied to the front of ankle on the right change location daily. 30 patch 12  . pantoprazole (PROTONIX) 20 MG tablet Take 1 tablet (20 mg total) by mouth daily. 90 tablet 3  . rosuvastatin (CRESTOR) 40 MG tablet Take 1 tablet (40 mg total) by mouth daily. 90 tablet 3  . sitaGLIPtin (JANUVIA) 100 MG tablet Take 1 tablet (100 mg total) by mouth daily. (Patient taking differently: Take 100 mg by mouth at bedtime. ) 90 tablet 3   No current facility-administered medications for this visit.   Family History  Problem Relation Age of Onset  . Hyperlipidemia Mother   . Heart attack Father 73  . Hypertension Father   . Cancer Paternal Grandfather     Lung cancer  . Cancer Maternal Grandmother   . Heart attack Maternal Grandmother    Social History   Social History  . Marital Status: Widowed    Spouse Name: N/A  . Number of Children: N/A  . Years of Education: 14   Occupational History  . unemployed    Social History Main Topics  . Smoking status: Former Smoker -- 0.50 packs/day for 41 years    Types: Cigarettes    Quit date: 03/14/2015  . Smokeless tobacco: Never Used     Comment: Trying to cut back. 1/2PPD OR LESS  . Alcohol Use: No  . Drug  Use: No  . Sexual Activity: Not on file   Other Topics Concern  . Not on file   Social History Narrative   Moved to [place] from [place], shortly after the death of her husband, to live with her daughter.  She notes significant life stress related to her 47 year-old grand-daughter with bipolar disorder.  She has previously worked as a Occupational psychologist.   Review of Systems: Review of Systems  Constitutional: Negative for fever, chills and malaise/fatigue.  Cardiovascular: Negative for chest pain.  Genitourinary: Negative for dysuria.  Musculoskeletal: Negative for myalgias.  Neurological: Negative for tingling and sensory change.     Objective:  Physical Exam: Filed Vitals:   08/04/15 1021  BP: 100/61  Pulse: 85  Temp: 97.7 F (36.5 C)  TempSrc: Oral  Height: 5' 6.5" (1.689 m)  Weight: 195 lb 8 oz (88.678 kg)  SpO2: 99%  Physical Exam  Cardiovascular:  Pulses:      Dorsalis pedis pulses are 2+ on the right side, and 2+ on the left side.       Posterior tibial pulses are 2+ on the right side, and 2+ on the left side.  Musculoskeletal:  Right great toe s/p amputation, she has callus present bilaterally,   Neurological:  monofilament exam is intact to bilateral feet  Nursing note and vitals reviewed.   Assessment & Plan:  Case discussed with Dr. Daryll Drown  Diabetes mellitus type 2, controlled, with complications ([place]) A: Type 2 Diabetes mellitus controlled with peripheral neuropathy without long term insulin use  P: - Continue Metformin, Glipizide and Januvia - Reordered test strips.  Hyperlipidemia A: Hyperlipidemia  P: Repeat Lipid panel If not at goal on Crestor 40mg  daily, will consider zetia.    Medications Ordered Meds ordered this encounter  Medications  . glucose blood (GLUCOSE METER TEST) test strip    Sig: Use as instructed    Dispense:  100 each    Refill:  12    The patient is not insulin requiring, ICD 10 code E11.65. The patient tests  2-3 times per day.   Other Orders Orders Placed This Encounter  Procedures  . Lipid Profile   Follow Up: Return 104months.

## 2015-08-04 NOTE — Patient Instructions (Signed)
General Instructions:   Please bring your medicines with you each time you come to clinic.  Medicines may include prescription medications, over-the-counter medications, herbal remedies, eye drops, vitamins, or other pills.   Progress Toward Treatment Goals:  Treatment Goal 01/23/2015  Hemoglobin A1C at goal  Blood pressure at goal  Stop smoking smoking the same amount  Prevent falls -    Self Care Goals & Plans:  Self Care Goal 07/10/2015  Manage my medications take my medicines as prescribed; bring my medications to every visit; refill my medications on time  Monitor my health keep track of my blood glucose; bring my glucose meter and log to each visit  Eat healthy foods drink diet soda or water instead of juice or soda; eat more vegetables; eat foods that are low in salt; eat baked foods instead of fried foods; eat fruit for snacks and desserts  Be physically active find an activity I enjoy    Home Blood Glucose Monitoring 01/23/2015  Check my blood sugar (No Data)  When to check my blood sugar -     Care Management & Community Referrals:  Referral 01/23/2015  Referrals made for care management support none needed

## 2015-08-04 NOTE — Assessment & Plan Note (Signed)
A: Hyperlipidemia  P: Repeat Lipid panel If not at goal on Crestor 40mg  daily, will consider zetia.

## 2015-08-05 LAB — LIPID PANEL
CHOL/HDL RATIO: 2.6 ratio (ref 0.0–4.4)
Cholesterol, Total: 144 mg/dL (ref 100–199)
HDL: 55 mg/dL (ref >39)
LDL Calculated: 54 mg/dL (ref 0–99)
Triglycerides: 176 mg/dL — ABNORMAL HIGH (ref 0–149)
VLDL Cholesterol Cal: 35 mg/dL (ref 5–40)

## 2015-08-06 NOTE — Progress Notes (Signed)
Internal Medicine Clinic Attending  Case discussed with Dr. Hoffman soon after the resident saw the patient.  We reviewed the resident's history and exam and pertinent patient test results.  I agree with the assessment, diagnosis, and plan of care documented in the resident's note. 

## 2015-08-18 ENCOUNTER — Other Ambulatory Visit: Payer: Self-pay | Admitting: *Deleted

## 2015-08-18 ENCOUNTER — Other Ambulatory Visit: Payer: Self-pay | Admitting: Internal Medicine

## 2015-08-18 DIAGNOSIS — E1140 Type 2 diabetes mellitus with diabetic neuropathy, unspecified: Secondary | ICD-10-CM

## 2015-08-18 DIAGNOSIS — E1165 Type 2 diabetes mellitus with hyperglycemia: Principal | ICD-10-CM

## 2015-08-18 DIAGNOSIS — IMO0002 Reserved for concepts with insufficient information to code with codable children: Secondary | ICD-10-CM

## 2015-08-18 MED ORDER — GLIPIZIDE ER 10 MG PO TB24: 10.0000 mg | ORAL_TABLET | Freq: Every day | ORAL | Status: DC

## 2015-08-18 NOTE — Telephone Encounter (Signed)
Pt states pharmacy will not refill glipizide. Please call pt back.

## 2015-08-18 NOTE — Telephone Encounter (Signed)
Called [facility] and spoke to pharm, refill request sent to pcp

## 2015-09-07 ENCOUNTER — Other Ambulatory Visit: Payer: Self-pay | Admitting: *Deleted

## 2015-09-07 DIAGNOSIS — E1142 Type 2 diabetes mellitus with diabetic polyneuropathy: Secondary | ICD-10-CM

## 2015-09-07 MED ORDER — GABAPENTIN 600 MG PO TABS: 1200.0000 mg | ORAL_TABLET | Freq: Three times a day (TID) | ORAL | Status: DC

## 2015-09-07 NOTE — Telephone Encounter (Signed)
Refill request from pt's pharmacy. Last OV 08/04/2015, upcoming visit scheduled for 09/11/15.  Pcp unavailable-will send to attend for review.Julia Hidden Cassady3/27/201711:55 AM

## 2015-09-08 ENCOUNTER — Inpatient Hospital Stay (HOSPITAL_COMMUNITY)
Admission: EM | Admit: 2015-09-08 | Discharge: 2015-09-10 | DRG: 391 | Disposition: A | Discharge status: Home / Self Care | Payer: Medicare Other | Attending: Oncology | Admitting: Oncology

## 2015-09-08 ENCOUNTER — Emergency Department (HOSPITAL_COMMUNITY): Payer: Medicare Other

## 2015-09-08 ENCOUNTER — Encounter (HOSPITAL_COMMUNITY): Payer: Self-pay

## 2015-09-08 DIAGNOSIS — R404 Transient alteration of awareness: Secondary | ICD-10-CM | POA: Diagnosis not present

## 2015-09-08 DIAGNOSIS — E860 Dehydration: Secondary | ICD-10-CM | POA: Insufficient documentation

## 2015-09-08 DIAGNOSIS — Z7982 Long term (current) use of aspirin: Secondary | ICD-10-CM

## 2015-09-08 DIAGNOSIS — I2510 Atherosclerotic heart disease of native coronary artery without angina pectoris: Secondary | ICD-10-CM | POA: Diagnosis present

## 2015-09-08 DIAGNOSIS — Z89411 Acquired absence of right great toe: Secondary | ICD-10-CM

## 2015-09-08 DIAGNOSIS — I959 Hypotension, unspecified: Secondary | ICD-10-CM | POA: Diagnosis present

## 2015-09-08 DIAGNOSIS — E1110 Type 2 diabetes mellitus with ketoacidosis without coma: Secondary | ICD-10-CM | POA: Insufficient documentation

## 2015-09-08 DIAGNOSIS — S0003XA Contusion of scalp, initial encounter: Secondary | ICD-10-CM | POA: Diagnosis present

## 2015-09-08 DIAGNOSIS — E1142 Type 2 diabetes mellitus with diabetic polyneuropathy: Secondary | ICD-10-CM | POA: Diagnosis not present

## 2015-09-08 DIAGNOSIS — R7989 Other specified abnormal findings of blood chemistry: Secondary | ICD-10-CM | POA: Diagnosis present

## 2015-09-08 DIAGNOSIS — K219 Gastro-esophageal reflux disease without esophagitis: Secondary | ICD-10-CM | POA: Diagnosis present

## 2015-09-08 DIAGNOSIS — R42 Dizziness and giddiness: Secondary | ICD-10-CM | POA: Diagnosis not present

## 2015-09-08 DIAGNOSIS — Z885 Allergy status to narcotic agent status: Secondary | ICD-10-CM

## 2015-09-08 DIAGNOSIS — N179 Acute kidney failure, unspecified: Secondary | ICD-10-CM | POA: Diagnosis not present

## 2015-09-08 DIAGNOSIS — R55 Syncope and collapse: Secondary | ICD-10-CM | POA: Diagnosis not present

## 2015-09-08 DIAGNOSIS — A084 Viral intestinal infection, unspecified: Secondary | ICD-10-CM | POA: Diagnosis not present

## 2015-09-08 DIAGNOSIS — Z794 Long term (current) use of insulin: Secondary | ICD-10-CM | POA: Diagnosis not present

## 2015-09-08 DIAGNOSIS — G8929 Other chronic pain: Secondary | ICD-10-CM | POA: Diagnosis present

## 2015-09-08 DIAGNOSIS — Z8249 Family history of ischemic heart disease and other diseases of the circulatory system: Secondary | ICD-10-CM

## 2015-09-08 DIAGNOSIS — Z79899 Other long term (current) drug therapy: Secondary | ICD-10-CM

## 2015-09-08 DIAGNOSIS — Z8673 Personal history of transient ischemic attack (TIA), and cerebral infarction without residual deficits: Secondary | ICD-10-CM

## 2015-09-08 DIAGNOSIS — E0829 Diabetes mellitus due to underlying condition with other diabetic kidney complication: Secondary | ICD-10-CM | POA: Insufficient documentation

## 2015-09-08 DIAGNOSIS — I10 Essential (primary) hypertension: Secondary | ICD-10-CM | POA: Diagnosis present

## 2015-09-08 DIAGNOSIS — F319 Bipolar disorder, unspecified: Secondary | ICD-10-CM | POA: Diagnosis present

## 2015-09-08 DIAGNOSIS — R778 Other specified abnormalities of plasma proteins: Secondary | ICD-10-CM

## 2015-09-08 DIAGNOSIS — Z66 Do not resuscitate: Secondary | ICD-10-CM | POA: Diagnosis present

## 2015-09-08 DIAGNOSIS — R748 Abnormal levels of other serum enzymes: Secondary | ICD-10-CM | POA: Diagnosis present

## 2015-09-08 DIAGNOSIS — R112 Nausea with vomiting, unspecified: Secondary | ICD-10-CM | POA: Diagnosis not present

## 2015-09-08 DIAGNOSIS — S0990XA Unspecified injury of head, initial encounter: Secondary | ICD-10-CM | POA: Diagnosis not present

## 2015-09-08 DIAGNOSIS — E1310 Other specified diabetes mellitus with ketoacidosis without coma: Secondary | ICD-10-CM | POA: Diagnosis not present

## 2015-09-08 DIAGNOSIS — Z7984 Long term (current) use of oral hypoglycemic drugs: Secondary | ICD-10-CM

## 2015-09-08 DIAGNOSIS — E781 Pure hyperglyceridemia: Secondary | ICD-10-CM | POA: Diagnosis present

## 2015-09-08 DIAGNOSIS — E785 Hyperlipidemia, unspecified: Secondary | ICD-10-CM | POA: Diagnosis present

## 2015-09-08 DIAGNOSIS — Z888 Allergy status to other drugs, medicaments and biological substances status: Secondary | ICD-10-CM

## 2015-09-08 DIAGNOSIS — J449 Chronic obstructive pulmonary disease, unspecified: Secondary | ICD-10-CM | POA: Diagnosis present

## 2015-09-08 DIAGNOSIS — Z7951 Long term (current) use of inhaled steroids: Secondary | ICD-10-CM

## 2015-09-08 DIAGNOSIS — Z87891 Personal history of nicotine dependence: Secondary | ICD-10-CM

## 2015-09-08 DIAGNOSIS — M542 Cervicalgia: Secondary | ICD-10-CM | POA: Diagnosis present

## 2015-09-08 DIAGNOSIS — W19XXXA Unspecified fall, initial encounter: Secondary | ICD-10-CM | POA: Diagnosis present

## 2015-09-08 HISTORY — DX: Type 2 diabetes mellitus without complications: E11.9

## 2015-09-08 HISTORY — DX: Anemia, unspecified: D64.9

## 2015-09-08 HISTORY — DX: Migraine, unspecified, not intractable, without status migrainosus: G43.909

## 2015-09-08 HISTORY — DX: Gastro-esophageal reflux disease without esophagitis: K21.9

## 2015-09-08 HISTORY — DX: Unspecified osteoarthritis, unspecified site: M19.90

## 2015-09-08 LAB — CBC WITH DIFFERENTIAL/PLATELET
BASOS ABS: 0 10*3/uL (ref 0.0–0.1)
BASOS PCT: 0 %
EOS ABS: 0.1 10*3/uL (ref 0.0–0.7)
EOS PCT: 1 %
HCT: 44 % (ref 36.0–46.0)
HEMOGLOBIN: 14.3 g/dL (ref 12.0–15.0)
LYMPHS ABS: 2 10*3/uL (ref 0.7–4.0)
Lymphocytes Relative: 18 %
MCH: 29.3 pg (ref 26.0–34.0)
MCHC: 32.5 g/dL (ref 30.0–36.0)
MCV: 90.2 fL (ref 78.0–100.0)
Monocytes Absolute: 0.6 10*3/uL (ref 0.1–1.0)
Monocytes Relative: 6 %
NEUTROS PCT: 75 %
Neutro Abs: 8.2 10*3/uL — ABNORMAL HIGH (ref 1.7–7.7)
PLATELETS: 196 10*3/uL (ref 150–400)
RBC: 4.88 MIL/uL (ref 3.87–5.11)
RDW: 15.8 % — ABNORMAL HIGH (ref 11.5–15.5)
WBC: 10.8 10*3/uL — AB (ref 4.0–10.5)

## 2015-09-08 LAB — BASIC METABOLIC PANEL
ANION GAP: 13 (ref 5–15)
BUN: 24 mg/dL — AB (ref 6–20)
CHLORIDE: 109 mmol/L (ref 101–111)
CO2: 18 mmol/L — ABNORMAL LOW (ref 22–32)
Calcium: 9.7 mg/dL (ref 8.9–10.3)
Creatinine, Ser: 1.84 mg/dL — ABNORMAL HIGH (ref 0.44–1.00)
GFR, EST AFRICAN AMERICAN: 33 mL/min — AB (ref >60)
GFR, EST NON AFRICAN AMERICAN: 28 mL/min — AB (ref >60)
Glucose, Bld: 194 mg/dL — ABNORMAL HIGH (ref 65–99)
POTASSIUM: 3.6 mmol/L (ref 3.5–5.1)
SODIUM: 140 mmol/L (ref 135–145)

## 2015-09-08 LAB — HEPATIC FUNCTION PANEL
ALK PHOS: 43 U/L (ref 38–126)
ALT: 28 U/L (ref 14–54)
AST: 30 U/L (ref 15–41)
Albumin: 3.3 g/dL — ABNORMAL LOW (ref 3.5–5.0)
BILIRUBIN DIRECT: 0.2 mg/dL (ref 0.1–0.5)
BILIRUBIN INDIRECT: 0.1 mg/dL — AB (ref 0.3–0.9)
TOTAL PROTEIN: 5.8 g/dL — AB (ref 6.5–8.1)
Total Bilirubin: 0.3 mg/dL (ref 0.3–1.2)

## 2015-09-08 LAB — LIPASE, BLOOD: Lipase: 38 U/L (ref 11–51)

## 2015-09-08 LAB — TROPONIN I
TROPONIN I: 0.04 ng/mL — AB (ref <0.031)
Troponin I: 0.05 ng/mL — ABNORMAL HIGH (ref <0.031)

## 2015-09-08 LAB — GLUCOSE, CAPILLARY: Glucose-Capillary: 203 mg/dL — ABNORMAL HIGH (ref 65–99)

## 2015-09-08 LAB — CBG MONITORING, ED: GLUCOSE-CAPILLARY: 187 mg/dL — AB (ref 65–99)

## 2015-09-08 MED ORDER — SODIUM CHLORIDE 0.9 % IV BOLUS (SEPSIS)
1000.0000 mL | Freq: Once | INTRAVENOUS | Status: AC
  Administered 2015-09-08: 1000 mL via INTRAVENOUS

## 2015-09-08 MED ORDER — ENOXAPARIN SODIUM 40 MG/0.4ML ~~LOC~~ SOLN
40.0000 mg | SUBCUTANEOUS | Status: DC
  Administered 2015-09-08 – 2015-09-09 (×2): 40 mg via SUBCUTANEOUS
  Filled 2015-09-08 (×2): qty 0.4

## 2015-09-08 MED ORDER — INSULIN ASPART 100 UNIT/ML ~~LOC~~ SOLN
0.0000 [IU] | Freq: Every day | SUBCUTANEOUS | Status: DC
  Administered 2015-09-08 – 2015-09-09 (×2): 2 [IU] via SUBCUTANEOUS

## 2015-09-08 MED ORDER — ONDANSETRON HCL 4 MG/2ML IJ SOLN
4.0000 mg | Freq: Four times a day (QID) | INTRAMUSCULAR | Status: DC | PRN
Start: 2015-09-08 — End: 2015-09-10

## 2015-09-08 MED ORDER — ACETAMINOPHEN 650 MG RE SUPP: 650.0000 mg | Freq: Four times a day (QID) | RECTAL | Status: DC | PRN

## 2015-09-08 MED ORDER — ASPIRIN 81 MG PO CHEW
324.0000 mg | CHEWABLE_TABLET | Freq: Once | ORAL | Status: AC
  Administered 2015-09-08: 324 mg via ORAL
  Filled 2015-09-08: qty 4

## 2015-09-08 MED ORDER — CAPSAICIN 0.025 % EX CREA
TOPICAL_CREAM | Freq: Two times a day (BID) | CUTANEOUS | Status: DC
  Administered 2015-09-08 – 2015-09-09 (×2): via TOPICAL
  Filled 2015-09-08: qty 56.6

## 2015-09-08 MED ORDER — ACETAMINOPHEN 325 MG PO TABS
650.0000 mg | ORAL_TABLET | Freq: Four times a day (QID) | ORAL | Status: DC | PRN
  Administered 2015-09-08: 650 mg via ORAL
  Filled 2015-09-08: qty 2

## 2015-09-08 MED ORDER — ALBUTEROL SULFATE (2.5 MG/3ML) 0.083% IN NEBU: 2.5000 mg | INHALATION_SOLUTION | RESPIRATORY_TRACT | Status: DC | PRN

## 2015-09-08 MED ORDER — SODIUM CHLORIDE 0.9 % IV SOLN: INTRAVENOUS | Status: AC

## 2015-09-08 MED ORDER — INSULIN ASPART 100 UNIT/ML ~~LOC~~ SOLN
0.0000 [IU] | Freq: Three times a day (TID) | SUBCUTANEOUS | Status: DC
  Administered 2015-09-09: 2 [IU] via SUBCUTANEOUS
  Administered 2015-09-09 (×2): 1 [IU] via SUBCUTANEOUS
  Administered 2015-09-10 (×2): 3 [IU] via SUBCUTANEOUS

## 2015-09-08 MED ORDER — ASPIRIN 81 MG PO TABS: 81.0000 mg | ORAL_TABLET | ORAL | Status: DC

## 2015-09-08 MED ORDER — SODIUM CHLORIDE 0.9% FLUSH
3.0000 mL | Freq: Two times a day (BID) | INTRAVENOUS | Status: DC
  Administered 2015-09-08 – 2015-09-09 (×3): 3 mL via INTRAVENOUS

## 2015-09-08 MED ORDER — ASPIRIN 81 MG PO CHEW
81.0000 mg | CHEWABLE_TABLET | Freq: Every day | ORAL | Status: DC
  Administered 2015-09-09 – 2015-09-10 (×2): 81 mg via ORAL
  Filled 2015-09-08 (×2): qty 1

## 2015-09-08 MED ORDER — BUTALBITAL-APAP-CAFFEINE 50-325-40 MG PO TABS
1.0000 | ORAL_TABLET | Freq: Every day | ORAL | Status: DC
  Administered 2015-09-08 – 2015-09-09 (×2): 1 via ORAL
  Filled 2015-09-08 (×2): qty 1

## 2015-09-08 MED ORDER — ROSUVASTATIN CALCIUM 40 MG PO TABS
40.0000 mg | ORAL_TABLET | Freq: Every day | ORAL | Status: DC
  Administered 2015-09-09: 40 mg via ORAL
  Filled 2015-09-08: qty 1

## 2015-09-08 NOTE — ED Notes (Signed)
Clear liquid diet ordered.

## 2015-09-08 NOTE — ED Notes (Signed)
This Rn accompanied pt to CT 

## 2015-09-08 NOTE — ED Notes (Signed)
Per EMS, Pt is coming from home. Pt had gotten out of the bathroom and started to feel dizzy. Pt fell with no memory of the episode. Hematoma noted to the back of the head. Pt has had nausea and vomiting for the last two days with diarrhea of four episodes today. Pt is unsteady on her feet. Daughter reports hearing fall from another part of the house and finding patient on the hardwood floors. Vitals per EMS: 91/61, 104 HR, 16 RR, 95% on RA, 218 CBG. Hx of Cardiac Hx of unknown specifics per patient.

## 2015-09-08 NOTE — H&P (Signed)
Date: 09/08/2015               Patient Name:  [name] MRN: [id number]  DOB: Nov 30, 1950 Age / Sex: 65 y.o., female   PCP: Riccardo Dubin, MD         Medical Service: Internal Medicine Teaching Service         Attending Physician: Dr. Annia Belt, MD    First Contact: Dr. Marlowe Sax Pager: [phone]  Second Contact: Dr. Randell Patient  Pager: [phone]       After Hours (After 5p/  First Contact Pager: [phone]  weekends / holidays): Second Contact Pager: [phone]   Chief Complaint: syncope   History of Present Illness: Patient is a 65 yo F with a PMHx of HTN, DM, hypertriglyceridemia, COPD presenting to the hospital after an episode of syncope today. States she has been "sick" for the past 3 days, experiencing nausea, bilious vomiting after she ran out of her Gabapentin prescription. States she felt lightheaded/ dizzy today and just remembers walking to the bathroom and all of a sudden her entire body felt heavy. She then woke up with EMS around her. Patient lives her daughter who heard her fall and called EMS. Patient believes she had lost consciousness for approximately 5 mins and was not confused after she woke up. Does report having pain in the back of the head since the fall. Denies any prior history of syncope. Denies any prior history of seizures. Denies any bowel/ bladder incontinence or tongue biting during this episode. States her nausea/ vomiting resolved after she took gabapentin yesterday. However, started having non-bloody watery diarrhea today - 5 episodes since morning. Denies having any abdominal pain. Denies any recent sick contacts or eating anything new. 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 Denies having any nausea, vomiting, focal weakness or numbness prior to these past 3 days. She does report having 4 TIAs in the past, most recent >3 years ago when she lived in [place]. Denies having any  palpitations, CP, or SOB prior to loosing consciousness today.   Vitals per EMS: 91/61, 104 HR, 16 RR, 95% on RA, 218 CBG. On arrival to the ED, she was found to be hypotensive (BP 62/46). Cr 1.84 with a baseline of 0.7-0.8. BP improved with IVF. Glucose 187. Troponin was found to be mildly elevated at 0.04. She was given Aspirin in the ED. CXR normal. CT head showing no acute intracranial abnormality but showing posterior scalp hematoma/ laceration.   Meds: Current Facility-Administered Medications  Medication Dose Route Frequency Provider Last Rate Last Dose  . aspirin chewable tablet 324 mg  324 mg Oral Once Margaretann Loveless, MD      . sodium chloride 0.9 % bolus 1,000 mL  1,000 mL Intravenous Once Margaretann Loveless, MD       Current Outpatient Prescriptions  Medication Sig Dispense Refill  . albuterol (PROVENTIL HFA;VENTOLIN HFA) 108 (90 BASE) MCG/ACT inhaler Inhale 1-2 puffs into the lungs every 6 (six) hours as needed for wheezing or shortness of breath. 1 Inhaler 3  . amLODipine (NORVASC) 5 MG tablet Take 1 tablet (5 mg total) by mouth daily. 30 tablet 11  . aspirin 81 MG tablet Take 81 mg by mouth every morning.     . butalbital-acetaminophen-caffeine (FIORICET, ESGIC) 50-325-40 MG tablet Take 1 tablet by mouth at bedtime. 30 tablet 0  . DULoxetine (CYMBALTA) 60 MG capsule Take 1 capsule (60 mg  total) by mouth every morning. 90 capsule 3  . feeding supplement, GLUCERNA SHAKE, (GLUCERNA SHAKE) LIQD Take 237 mLs by mouth 3 (three) times daily between meals. 90 Can 5  . fluticasone (FLONASE) 50 MCG/ACT nasal spray Place 2 sprays into both nostrils daily. Once symptoms are controlled, may reduce dosage to 1 spray per nostril once daily. 9.9 g 0  . gabapentin (NEURONTIN) 600 MG tablet Take 2 tablets (1,200 mg total) by mouth 3 (three) times daily. 180 tablet 6  . glipiZIDE (GLUCOTROL XL) 10 MG 24 hr tablet Take 1 tablet (10 mg total) by mouth daily with supper. 90 tablet 3  . glucose blood (GLUCOSE  METER TEST) test strip Use as instructed 100 each 12  . lisinopril (PRINIVIL,ZESTRIL) 40 MG tablet take 1 tablet by mouth once daily 90 tablet 3  . metFORMIN (GLUCOPHAGE-XR) 750 MG 24 hr tablet Take 2 tablets (1,500 mg total) by mouth daily with breakfast. 180 tablet 3  . mupirocin ointment (BACTROBAN) 2 % Place 1 application into the nose daily as needed. Apply to foot    . nitroGLYCERIN (NITRODUR - DOSED IN MG/24 HR) 0.2 mg/hr patch Place 1 patch (0.2 mg total) onto the skin daily. Applied to the front of ankle on the right change location daily. 30 patch 12  . pantoprazole (PROTONIX) 20 MG tablet Take 1 tablet (20 mg total) by mouth daily. 90 tablet 3  . rosuvastatin (CRESTOR) 40 MG tablet Take 1 tablet (40 mg total) by mouth daily. 90 tablet 3  . sitaGLIPtin (JANUVIA) 100 MG tablet Take 1 tablet (100 mg total) by mouth daily. (Patient taking differently: Take 100 mg by mouth at bedtime. ) 90 tablet 3    Allergies: Allergies as of 09/08/2015 - Review Complete 09/08/2015  Allergen Reaction Noted  . Flexeril [cyclobenzaprine] Other (See Comments) 08/12/2012  . Soma [carisoprodol] Itching and Rash 05/13/2012   Past Medical History  Diagnosis Date  . Hypertension   . Diabetes mellitus without complication ([place])     diagnosed around 2010, only ever on metformin  . Chronic pain     neck pain, headache, neuropathy  . Hypertriglyceridemia   . Neuromuscular disorder ([place])   . COPD (chronic obstructive pulmonary disease) ([place])   . Depression   . Puncture wound of foot, right 05/17/2012    Tetanus shot 3 yrs ago at [facility] in [place], per pt report   . Brachial plexus disorders    Past Surgical History  Procedure Laterality Date  . Rotator cuff repair    . Carpal tunnel release    . Abdominal hysterectomy    . Cholecystectomy    . 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 Hyperlipidemia Mother   . Heart attack Father 100  . Hypertension Father   . Cancer Paternal Grandfather     Lung cancer  . Cancer Maternal Grandmother   . Heart attack Maternal Grandmother    Social History   Social History  . Marital Status: Widowed    Spouse Name: N/A  . Number of Children: N/A  . Years of Education: 14   Occupational History  . unemployed    Social History Main Topics  . Smoking status: Former Smoker -- 0.50 packs/day for 41 years    Types: Cigarettes    Quit  date: 03/14/2015  . Smokeless tobacco: Never Used     Comment: Trying to cut back. 1/2PPD OR LESS  . Alcohol Use: No  . Drug Use: No  . Sexual Activity: Not on file   Other Topics Concern  . Not on file   Social History Narrative   Moved to [facility] from [place], shortly after the death of her husband, to live with her daughter.  She notes significant life stress related to her 61 year-old grand-daughter with bipolar disorder.  She has previously worked as a Occupational psychologist.    Review of Systems: Review of Systems  Constitutional: Positive for malaise/fatigue. Negative for fever and chills.  HENT: Negative for congestion and sore throat.        Headaches (chronic)  Eyes: Negative for blurred vision and pain.  Respiratory: Negative for cough, shortness of breath and wheezing.   Cardiovascular: Negative for chest pain, palpitations and leg swelling.  Gastrointestinal: Positive for nausea, vomiting and diarrhea. Negative for abdominal pain and blood in stool.  Genitourinary: Negative for dysuria.  Musculoskeletal: Negative for back pain and joint pain.  Skin: Negative for itching and rash.  Neurological: Positive for dizziness and loss of consciousness. Negative for seizures.    Physical Exam: Blood pressure 102/61, pulse 87, temperature 98.4 F (36.9 C), temperature source Oral, resp. rate 19, SpO2 94  %. Physical Exam  Constitutional: She is oriented to person, place, and time. She appears well-developed and well-nourished. No distress.  HENT:  Head: Normocephalic and atraumatic.  Dry mucous membranes  Scalp hematoma noted in the right occipital area. Area mildly tender to touch.   Eyes: EOM are normal. Pupils are equal, round, and reactive to light.  Neck: Neck supple. No tracheal deviation present.  No carotid bruit auscultated.   Cardiovascular: Normal rate, regular rhythm and intact distal pulses.  Exam reveals no gallop and no friction rub.   Murmur heard. Grade 1/6 systolic ejection murmur  Pulmonary/Chest: Effort normal and breath sounds normal. No respiratory distress. She has no wheezes. She has no rales.  Abdominal: Soft. Bowel sounds are normal. She exhibits no distension. There is no rebound and no guarding.  Mild diffuse tenderness on palpation.   Musculoskeletal: She exhibits no edema.  Neurological: She is alert and oriented to person, place, and time. No cranial nerve deficit.  Strength and sensation grossly intact in bilateral upper and lower extremities.   Skin: Skin is warm and dry. She is not diaphoretic.  Psychiatric: She has a normal mood and affect. Her behavior is normal.   Lab results: Basic Metabolic Panel:  Recent Labs  09/08/15 1553  NA 140  K 3.6  CL 109  CO2 18*  GLUCOSE 194*  BUN 24*  CREATININE 1.84*  CALCIUM 9.7   CBC:  Recent Labs  09/08/15 1553  WBC 10.8*  NEUTROABS 8.2*  HGB 14.3  HCT 44.0  MCV 90.2  PLT 196   Cardiac Enzymes:  Recent Labs  09/08/15 1553  TROPONINI 0.04*   CBG:  Recent Labs  09/08/15 1624  GLUCAP 187*   Urine Drug Screen: Drugs of Abuse     Component Value Date/Time   LABOPIA NONE DETECTED 03/09/2013 2346   COCAINSCRNUR NONE DETECTED 03/09/2013 2346   LABBENZ NONE DETECTED 03/09/2013 2346   AMPHETMU NONE DETECTED 03/09/2013 2346   THCU NONE DETECTED 03/09/2013 2346   LABBARB NONE DETECTED  03/09/2013 2346     Imaging results:  Ct Head Wo Contrast  09/08/2015  CLINICAL DATA:  Syncope.  Fall and hit head. EXAM: CT HEAD WITHOUT CONTRAST TECHNIQUE: Contiguous axial images were obtained from the base of the skull through the vertex without intravenous contrast. COMPARISON:  09/28/2013 FINDINGS: Mild low attenuation within the subcortical white matter of the right frontal lobe is noted. No acute cortical infarct, hemorrhage, or mass lesion is present. Ventricles are of normal size. No significant extra-axial fluid collection is present. The paranasal sinuses andmastoid air cells are clear. The osseous skull is intact. the paranasal sinuses are clear. The mastoid air cells are clear. The calvarium is intact. Posterior scalp hematoma is identified measuring approximately 4 cm. IMPRESSION: 1. No acute intracranial abnormalities. 2. Mild small vessel ischemic change. 3. Posterior scalp laceration/hematoma Electronically Signed   By: Kerby Moors M.D.   On: 09/08/2015 16:27   Dg Chest Portable 1 View  09/08/2015  CLINICAL DATA:  Syncope EXAM: PORTABLE CHEST 1 VIEW COMPARISON:  07/11/2014 chest radiograph. FINDINGS: Stable cardiomediastinal silhouette with normal heart size. No pneumothorax. No pleural effusion. Lungs appear clear, with no acute consolidative airspace disease and no pulmonary edema. IMPRESSION: No active disease. Electronically Signed   By: Ilona Sorrel M.D.   On: 09/08/2015 16:35    Other results: EKG: NSR at 86 bpm. No acute ST/ T wave changes.   Assessment & Plan by Problem: Active Problems:   Syncope  Syncope Likely orthostatic in the setting of viral gastroenteritis. Patient reports having nausea and vomiting for the past 3 days and is not able to tolerate PO intake. Also had 5 episodes of watery diarrhea today. On arrival to the ED, she was found to be hypotensive (BP 62/46). Cr 1.84 with a baseline of 0.7-0.8. BP improved with bolus of IVF in the ED. Glucose 187.  CT  head showing no acute intracranial abnormality but showing posterior scalp hematoma/ laceration. No likely seizure activity because patient denies any tongue biting, bowel/ bladder incontinence. Not likely cardiac/ arrhythmia because patient denies any CP, palpitations, or SOB. TIA less likely as patient does not report any focal neurological symptoms.  -Admit to telemetry -NS@ 125 cc/hr -Tylenol prn scalp pain -Check orthostatic vitals  -PT eval and treat   Viral gastroenteritis Patient reports having nausea and bilious vomiting for the past 3 days and had 5 episodes of watery non-bloody diarrhea today. Denies eating anything new or recent sick contacts.  -Continue NS@ 125 cc/hr -Clears diet for now. Advance as tolerated.  -Zofran 4 mg q6 prn nausea   Elevated troponin Troponin was found to be mildly elevated at 0.04.She was given Aspirin in the ED. CXR normal. She denies having any CP or SOB. -Continue to trend troponins   AKI Cr 1.84 with a baseline of 0.7-0.8. Likely pre-renal azotemia in the setting of viral gastroenteritis (n/v/d). -BMET in am   COPD -Albuterol nebulizer  HTN -Hold home BP medications Amlodipine 5 mg daily and Lisinopril 40 mg daily in the setting of hypotension and syncope.   HLD -Crestor 40 mg daily  CAD -Aspirin 81 mg daily  DM -Hold home oral meds Glipizide 10 mg daily, Metformin 1500 mg daily, and Januvia 100 mg daily.  -SSI   Depression -Hold Cymbalta 60 mg daily  Peripheral neuropathy -[place] Gabapentin 1200 mg TID -Capsaicin cream BID  GERD: Protonix 20 mg daily  DVT/PE ppx: Lovenox   Diet: clears for now. Advance as tolerated.   Code: DNR (confirmed with patient)  Dispo: Disposition is deferred at this time, awaiting improvement of current  medical problems. Anticipated discharge in approximately 1-2 day(s).   The patient does have a current PCP (Rushil Sherrye Payor, MD) and does need an [facility] hospital follow-up appointment after  discharge.  The patient does not have transportation limitations that hinder transportation to clinic appointments.  Signed: Shela Leff, MD 09/08/2015, 5:36 PM

## 2015-09-08 NOTE — ED Provider Notes (Signed)
I have personally seen and examined the patient.  I have discussed the plan of care with the resident.  I have reviewed the documentation on PMH/FH/Soc. History.  I have reviewed the documentation of the resident and agree.   EKG Interpretation  Date/Time:  Tuesday September 08 2015 16:33:43 EDT Ventricular Rate:  86 PR Interval:  169 QRS Duration: 80 QT Interval:  372 QTC Calculation: 445 R Axis:   44 Text Interpretation:  Sinus rhythm Consider left atrial enlargement Borderline repolarization abnormality No significant change since last tracing Confirmed by Sieara Bremer MD, DANIEL [phone]) on 09/08/2015 4:48:27 PM       65 yo F with a chief complaint of vomiting and diarrhea for the past few days had a syncopal event today. Patient felt terrible today denied chest pain or shortness of breath denied headaches. On exam patient is chronically ill-appearing. Lungs are clear to station bilaterally noted abdominal tenderness. Initial pressure was 65/40. Patient was given 2 L of fluid with improvement into the 90s. Troponin was positive. Will admit the patient for exertional syncope with a positive troponin.  CRITICAL CARE Performed by: Cecilio Asper   Total critical care time: 35 minutes  Critical care time was exclusive of separately billable procedures and treating other patients.  Critical care was necessary to treat or prevent imminent or life-threatening deterioration.  Critical care was time spent personally by me on the following activities: development of treatment plan with patient and/or surrogate as well as nursing, discussions with consultants, evaluation of patient's response to treatment, examination of patient, obtaining history from patient or surrogate, ordering and performing treatments and interventions, ordering and review of laboratory studies, ordering and review of radiographic studies, pulse oximetry and re-evaluation of patient's condition.   Deno Etienne, DO 09/08/15  [id number]

## 2015-09-08 NOTE — ED Notes (Signed)
Attempted report 

## 2015-09-08 NOTE — ED Notes (Signed)
Pt placed in [place] and given fluids

## 2015-09-08 NOTE — ED Provider Notes (Signed)
CSN: [id number]     Arrival date & time 09/08/15  1539 History   First MD Initiated Contact with Patient 09/08/15 1541     Chief Complaint  Patient presents with  . Loss of Consciousness     (Consider location/radiation/quality/duration/timing/severity/associated sxs/prior Treatment) Patient is a 65 y.o. female presenting with syncope. The history is provided by the patient and the EMS personnel.  Loss of Consciousness Episode history:  Single Most recent episode:  Today Progression:  Resolved Chronicity:  New Context comment:  Patient with prolonged period of sitting followed by standing and walking, felt like "whole body was very heavy" then passed out and hit head on wooden floor. No preceeding CP or SOB. Exacerbated by: ambulating. Associated symptoms: headaches, nausea and vomiting   Associated symptoms: no chest pain, no diaphoresis, no dizziness, no fever, no palpitations and no shortness of breath   Associated symptoms comment:  Nausea, vomiting, diarrhea for 2 days before this event occurred Risk factors: no coronary artery disease     Past Medical History  Diagnosis Date  . Hypertension   . Diabetes mellitus without complication ([place])     diagnosed around 2010, only ever on metformin  . Chronic pain     neck pain, headache, neuropathy  . Hypertriglyceridemia   . Neuromuscular disorder ([place])   . COPD (chronic obstructive pulmonary disease) ([place])   . Depression   . Puncture wound of foot, right 05/17/2012    Tetanus shot 3 yrs ago at [facility] in [place], per pt report   . Brachial plexus disorders    Past Surgical History  Procedure Laterality Date  . Rotator cuff repair    . Carpal tunnel release    . Abdominal hysterectomy    . Cholecystectomy    . 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 Hyperlipidemia Mother   . Heart attack Father 46  . Hypertension Father   . Cancer Paternal Grandfather     Lung cancer  . Cancer Maternal Grandmother   . Heart attack Maternal Grandmother    Social History  Substance Use Topics  . Smoking status: Former Smoker -- 0.50 packs/day for 41 years    Types: Cigarettes    Quit date: 03/14/2015  . Smokeless tobacco: Never Used     Comment: Trying to cut back. 1/2PPD OR LESS  . Alcohol Use: No   OB History    No data available     Review of Systems  Constitutional: Positive for fatigue. Negative for fever, chills, diaphoresis, activity change and appetite change.  HENT: Negative for facial swelling, rhinorrhea, sore throat, trouble swallowing and voice change.   Eyes: Negative for photophobia, pain and visual disturbance.  Respiratory: Negative for cough, shortness of breath, wheezing and stridor.   Cardiovascular: Positive for syncope. Negative for chest pain, palpitations and leg swelling.  Gastrointestinal: Positive for nausea, vomiting and diarrhea. Negative for abdominal pain, constipation and anal bleeding.  Endocrine: Negative.   Genitourinary: Negative for dysuria, vaginal bleeding, vaginal discharge and vaginal pain.  Musculoskeletal: Negative for joint swelling.  Skin: Negative.  Negative for rash.  Allergic/Immunologic: Negative.   Neurological: Positive for syncope and headaches. Negative for dizziness and tremors.  Psychiatric/Behavioral: Negative for suicidal ideas, sleep disturbance and self-injury.  All other systems reviewed and are negative.  Allergies  Flexeril and Soma  Home Medications   Prior to Admission medications   Medication Sig Start Date End Date Taking? Authorizing Provider  albuterol (PROVENTIL HFA;VENTOLIN HFA) 108 (90 BASE) MCG/ACT inhaler Inhale 1-2 puffs into the lungs every 6 (six) hours as needed for wheezing or shortness of breath. 01/24/14   Rushil Sherrye Payor, MD  amLODipine (NORVASC) 5  MG tablet Take 1 tablet (5 mg total) by mouth daily. 05/11/15 05/10/16  Florinda Marker, MD  aspirin 81 MG tablet Take 81 mg by mouth every morning.     Historical Provider, MD  butalbital-acetaminophen-caffeine (FIORICET, ESGIC) 50-325-40 MG tablet Take 1 tablet by mouth at bedtime. 07/10/15   Rushil Sherrye Payor, MD  DULoxetine (CYMBALTA) 60 MG capsule Take 1 capsule (60 mg total) by mouth every morning. 05/11/15   Alexa Angela Burke, MD  feeding supplement, GLUCERNA SHAKE, (GLUCERNA SHAKE) LIQD Take 237 mLs by mouth 3 (three) times daily between meals. 05/27/15   Rushil Sherrye Payor, MD  fluticasone (FLONASE) 50 MCG/ACT nasal spray Place 2 sprays into both nostrils daily. Once symptoms are controlled, may reduce dosage to 1 spray per nostril once daily. 06/18/15 06/17/16  Milagros Loll, MD  gabapentin (NEURONTIN) 600 MG tablet Take 2 tablets (1,200 mg total) by mouth 3 (three) times daily. 09/07/15   Sid Falcon, MD  glipiZIDE (GLUCOTROL XL) 10 MG 24 hr tablet Take 1 tablet (10 mg total) by mouth daily with supper. 08/18/15   Nischal Narendra, MD  glucose blood (GLUCOSE METER TEST) test strip Use as instructed 08/04/15   Lucious Groves, DO  lisinopril (PRINIVIL,ZESTRIL) 40 MG tablet take 1 tablet by mouth once daily 06/17/15   Riccardo Dubin, MD  metFORMIN (GLUCOPHAGE-XR) 750 MG 24 hr tablet Take 2 tablets (1,500 mg total) by mouth daily with breakfast. 01/29/15   Riccardo Dubin, MD  mupirocin ointment (BACTROBAN) 2 % Place 1 application into the nose daily as needed. Apply to foot    Historical Provider, MD  nitroGLYCERIN (NITRODUR - DOSED IN MG/24 HR) 0.2 mg/hr patch Place 1 patch (0.2 mg total) onto the skin daily. Applied to the front of ankle on the right change location daily. 05/23/15   Newt Minion, MD  pantoprazole (PROTONIX) 20 MG tablet Take 1 tablet (20 mg total) by mouth daily. 07/18/14   Riccardo Dubin, MD  rosuvastatin (CRESTOR) 40 MG tablet Take 1 tablet (40 mg total) by mouth daily. 07/10/15   Rushil Sherrye Payor, MD  sitaGLIPtin (JANUVIA) 100 MG tablet Take 1 tablet (100 mg total) by mouth daily. Patient taking differently: Take 100 mg by mouth at bedtime.  11/13/14   Lucious Groves, DO   BP 102/61 mmHg  Pulse 87  Temp(Src) 98.4 F (36.9 C) (Oral)  Resp 19  SpO2 94% Physical Exam  Constitutional: She is oriented to person, place, and time. She appears well-developed and well-nourished. No distress.  HENT:  Head: Normocephalic and atraumatic.  Right Ear: External ear normal.  Left Ear: External ear normal.  Mouth/Throat: Oropharynx is clear and moist. No oropharyngeal exudate.  Hematoma to occopital scalp with no laceration.  Eyes: Conjunctivae and EOM are normal. Pupils are equal, round, and reactive to light. No scleral icterus.  Neck: Normal range of motion. Neck supple. No JVD present. No tracheal deviation present. No thyromegaly present.  Cardiovascular: Normal rate, regular rhythm and intact distal pulses.   2+ radial pulses equal bilaterally  Pulmonary/Chest: Effort normal and breath  sounds normal. No respiratory distress. She has no wheezes. She has no rales.  Abdominal: Soft. Bowel sounds are normal. She exhibits no distension. There is no tenderness.  Musculoskeletal: Normal range of motion. She exhibits no edema or tenderness.  Neurological: She is alert and oriented to person, place, and time. No cranial nerve deficit. She exhibits normal muscle tone. Coordination normal.  5/5 strength in all 4 extremities. Sensation intact and equal in all 4 extremities. Patient able to sit up and stand up without orthostatic vitals however she is symptomatic.    Skin: Skin is warm and dry. She is not diaphoretic. No pallor.  Psychiatric: She has a normal mood and affect. She expresses no homicidal and no suicidal ideation. She expresses no suicidal plans and no homicidal plans.  Nursing note and vitals reviewed.   ED Course  Procedures (including critical care time) Labs Review Labs Reviewed   CBC WITH DIFFERENTIAL/PLATELET - Abnormal; Notable for the following:    WBC 10.8 (*)    RDW 15.8 (*)    Neutro Abs 8.2 (*)    All other components within normal limits  BASIC METABOLIC PANEL - Abnormal; Notable for the following:    CO2 18 (*)    Glucose, Bld 194 (*)    BUN 24 (*)    Creatinine, Ser 1.84 (*)    GFR calc non Af Amer 28 (*)    GFR calc Af Amer 33 (*)    All other components within normal limits  TROPONIN I - Abnormal; Notable for the following:    Troponin I 0.04 (*)    All other components within normal limits  CBG MONITORING, ED - Abnormal; Notable for the following:    Glucose-Capillary 187 (*)    All other components within normal limits    Imaging Review Ct Head Wo Contrast  09/08/2015  CLINICAL DATA:  Syncope.  Fall and hit head. EXAM: CT HEAD WITHOUT CONTRAST TECHNIQUE: Contiguous axial images were obtained from the base of the skull through the vertex without intravenous contrast. COMPARISON:  09/28/2013 FINDINGS: Mild low attenuation within the subcortical white matter of the right frontal lobe is noted. No acute cortical infarct, hemorrhage, or mass lesion is present. Ventricles are of normal size. No significant extra-axial fluid collection is present. The paranasal sinuses andmastoid air cells are clear. The osseous skull is intact. the paranasal sinuses are clear. The mastoid air cells are clear. The calvarium is intact. Posterior scalp hematoma is identified measuring approximately 4 cm. IMPRESSION: 1. No acute intracranial abnormalities. 2. Mild small vessel ischemic change. 3. Posterior scalp laceration/hematoma Electronically Signed   By: Kerby Moors M.D.   On: 09/08/2015 16:27   Dg Chest Portable 1 View  09/08/2015  CLINICAL DATA:  Syncope EXAM: PORTABLE CHEST 1 VIEW COMPARISON:  07/11/2014 chest radiograph. FINDINGS: Stable cardiomediastinal silhouette with normal heart size. No pneumothorax. No pleural effusion. Lungs appear clear, with no acute  consolidative airspace disease and no pulmonary edema. IMPRESSION: No active disease. Electronically Signed   By: Ilona Sorrel M.D.   On: 09/08/2015 16:35   I have personally reviewed and evaluated these images and lab results as part of my medical decision-making.   EKG Interpretation   Date/Time:  Tuesday September 08 2015 16:33:43 EDT Ventricular Rate:  86 PR Interval:  169 QRS Duration: 80 QT Interval:  372 QTC Calculation: 445 R Axis:   44 Text Interpretation:  Sinus rhythm Consider left atrial enlargement  Borderline repolarization abnormality No significant change  since last  tracing Confirmed by FLOYD MD, DANIEL [phone]) on 09/08/2015 4:48:27 PM      MDM   Final diagnoses:  Syncope, unspecified syncope type  Elevated troponin    The patient is a 65 year old female with a history of hypertension and diabetes who presents after an episode of exertional syncope. Patient reports she has had vomiting for the past 2 days and diarrhea for 1 day. She reports that she stood up from a prolonged period of sitting was ambulating when she began to feel like her entire body was heavy. At this point she passed out and struck her head on the hardwood floor. On arrival the patient is to be hypotensive 62/46. This improves with IV fluids. Neurologic status normal. Head CT does not show any acute intracranial pathology. Troponin is mildly elevated to 0.04. Given exertional syncope in the setting of elevated troponin patient is given aspirin and admitted for further observation. Do not suspect aortic dissection as patient has no neurologic deficits currently and no current or previous chest pain. Patient expresses understanding and agreement with this plan.  Patient seen with attending, Dr. Tyrone Nine, who oversaw clinical decision making.   Margaretann Loveless, MD 09/08/15 [place], DO 09/08/15 1739

## 2015-09-09 DIAGNOSIS — I951 Orthostatic hypotension: Secondary | ICD-10-CM | POA: Diagnosis not present

## 2015-09-09 DIAGNOSIS — Z7982 Long term (current) use of aspirin: Secondary | ICD-10-CM | POA: Diagnosis not present

## 2015-09-09 DIAGNOSIS — G8929 Other chronic pain: Secondary | ICD-10-CM | POA: Diagnosis present

## 2015-09-09 DIAGNOSIS — Z7984 Long term (current) use of oral hypoglycemic drugs: Secondary | ICD-10-CM | POA: Diagnosis not present

## 2015-09-09 DIAGNOSIS — A084 Viral intestinal infection, unspecified: Secondary | ICD-10-CM | POA: Diagnosis present

## 2015-09-09 DIAGNOSIS — R112 Nausea with vomiting, unspecified: Secondary | ICD-10-CM

## 2015-09-09 DIAGNOSIS — Z794 Long term (current) use of insulin: Secondary | ICD-10-CM | POA: Diagnosis not present

## 2015-09-09 DIAGNOSIS — Z8673 Personal history of transient ischemic attack (TIA), and cerebral infarction without residual deficits: Secondary | ICD-10-CM | POA: Diagnosis not present

## 2015-09-09 DIAGNOSIS — Z7951 Long term (current) use of inhaled steroids: Secondary | ICD-10-CM

## 2015-09-09 DIAGNOSIS — K219 Gastro-esophageal reflux disease without esophagitis: Secondary | ICD-10-CM

## 2015-09-09 DIAGNOSIS — W19XXXA Unspecified fall, initial encounter: Secondary | ICD-10-CM

## 2015-09-09 DIAGNOSIS — R197 Diarrhea, unspecified: Secondary | ICD-10-CM

## 2015-09-09 DIAGNOSIS — E1310 Other specified diabetes mellitus with ketoacidosis without coma: Secondary | ICD-10-CM | POA: Diagnosis present

## 2015-09-09 DIAGNOSIS — I10 Essential (primary) hypertension: Secondary | ICD-10-CM

## 2015-09-09 DIAGNOSIS — R7989 Other specified abnormal findings of blood chemistry: Secondary | ICD-10-CM | POA: Diagnosis present

## 2015-09-09 DIAGNOSIS — R778 Other specified abnormalities of plasma proteins: Secondary | ICD-10-CM

## 2015-09-09 DIAGNOSIS — Z79899 Other long term (current) drug therapy: Secondary | ICD-10-CM | POA: Diagnosis not present

## 2015-09-09 DIAGNOSIS — Z885 Allergy status to narcotic agent status: Secondary | ICD-10-CM | POA: Diagnosis not present

## 2015-09-09 DIAGNOSIS — E785 Hyperlipidemia, unspecified: Secondary | ICD-10-CM

## 2015-09-09 DIAGNOSIS — I959 Hypotension, unspecified: Secondary | ICD-10-CM | POA: Diagnosis present

## 2015-09-09 DIAGNOSIS — S0003XA Contusion of scalp, initial encounter: Secondary | ICD-10-CM

## 2015-09-09 DIAGNOSIS — I2510 Atherosclerotic heart disease of native coronary artery without angina pectoris: Secondary | ICD-10-CM

## 2015-09-09 DIAGNOSIS — E1142 Type 2 diabetes mellitus with diabetic polyneuropathy: Secondary | ICD-10-CM | POA: Diagnosis present

## 2015-09-09 DIAGNOSIS — Z8249 Family history of ischemic heart disease and other diseases of the circulatory system: Secondary | ICD-10-CM | POA: Diagnosis not present

## 2015-09-09 DIAGNOSIS — Z87891 Personal history of nicotine dependence: Secondary | ICD-10-CM | POA: Diagnosis not present

## 2015-09-09 DIAGNOSIS — R55 Syncope and collapse: Secondary | ICD-10-CM | POA: Insufficient documentation

## 2015-09-09 DIAGNOSIS — Z89411 Acquired absence of right great toe: Secondary | ICD-10-CM | POA: Diagnosis not present

## 2015-09-09 DIAGNOSIS — N179 Acute kidney failure, unspecified: Secondary | ICD-10-CM | POA: Diagnosis present

## 2015-09-09 DIAGNOSIS — E781 Pure hyperglyceridemia: Secondary | ICD-10-CM | POA: Diagnosis present

## 2015-09-09 DIAGNOSIS — E860 Dehydration: Secondary | ICD-10-CM | POA: Diagnosis not present

## 2015-09-09 DIAGNOSIS — Z888 Allergy status to other drugs, medicaments and biological substances status: Secondary | ICD-10-CM | POA: Diagnosis not present

## 2015-09-09 DIAGNOSIS — E0829 Diabetes mellitus due to underlying condition with other diabetic kidney complication: Secondary | ICD-10-CM | POA: Insufficient documentation

## 2015-09-09 DIAGNOSIS — J449 Chronic obstructive pulmonary disease, unspecified: Secondary | ICD-10-CM

## 2015-09-09 DIAGNOSIS — R748 Abnormal levels of other serum enzymes: Secondary | ICD-10-CM | POA: Diagnosis present

## 2015-09-09 DIAGNOSIS — M542 Cervicalgia: Secondary | ICD-10-CM | POA: Diagnosis present

## 2015-09-09 DIAGNOSIS — F329 Major depressive disorder, single episode, unspecified: Secondary | ICD-10-CM

## 2015-09-09 DIAGNOSIS — Z66 Do not resuscitate: Secondary | ICD-10-CM | POA: Diagnosis present

## 2015-09-09 DIAGNOSIS — F319 Bipolar disorder, unspecified: Secondary | ICD-10-CM | POA: Diagnosis present

## 2015-09-09 LAB — CBC
HCT: 40.6 % (ref 36.0–46.0)
Hemoglobin: 13.5 g/dL (ref 12.0–15.0)
MCH: 30.2 pg (ref 26.0–34.0)
MCHC: 33.3 g/dL (ref 30.0–36.0)
MCV: 90.8 fL (ref 78.0–100.0)
Platelets: 160 K/uL (ref 150–400)
RBC: 4.47 MIL/uL (ref 3.87–5.11)
RDW: 16.1 % — ABNORMAL HIGH (ref 11.5–15.5)
WBC: 8.8 K/uL (ref 4.0–10.5)

## 2015-09-09 LAB — GLUCOSE, CAPILLARY
Glucose-Capillary: 145 mg/dL — ABNORMAL HIGH (ref 65–99)
Glucose-Capillary: 183 mg/dL — ABNORMAL HIGH (ref 65–99)
Glucose-Capillary: 145 mg/dL — ABNORMAL HIGH (ref 65–99)
Glucose-Capillary: 238 mg/dL — ABNORMAL HIGH (ref 65–99)

## 2015-09-09 LAB — BASIC METABOLIC PANEL
ANION GAP: 9 (ref 5–15)
BUN: 16 mg/dL (ref 6–20)
CALCIUM: 8.9 mg/dL (ref 8.9–10.3)
CO2: 19 mmol/L — ABNORMAL LOW (ref 22–32)
CREATININE: 1.11 mg/dL — AB (ref 0.44–1.00)
Chloride: 112 mmol/L — ABNORMAL HIGH (ref 101–111)
GFR, EST NON AFRICAN AMERICAN: 52 mL/min — AB (ref >60)
GLUCOSE: 143 mg/dL — AB (ref 65–99)
Potassium: 3.3 mmol/L — ABNORMAL LOW (ref 3.5–5.1)
Sodium: 140 mmol/L (ref 135–145)

## 2015-09-09 LAB — TROPONIN I: TROPONIN I: 0.04 ng/mL — AB (ref <0.031)

## 2015-09-09 MED ORDER — GABAPENTIN 600 MG PO TABS
1200.0000 mg | ORAL_TABLET | Freq: Three times a day (TID) | ORAL | Status: DC
  Administered 2015-09-09 – 2015-09-10 (×5): 1200 mg via ORAL
  Filled 2015-09-09 (×5): qty 2

## 2015-09-09 MED ORDER — KCL IN DEXTROSE-NACL 20-5-0.45 MEQ/L-%-% IV SOLN
INTRAVENOUS | Status: DC
  Administered 2015-09-09 – 2015-09-10 (×2): via INTRAVENOUS
  Filled 2015-09-09 (×2): qty 1000

## 2015-09-09 MED ORDER — ACETAMINOPHEN 500 MG PO TABS
1000.0000 mg | ORAL_TABLET | Freq: Four times a day (QID) | ORAL | Status: DC | PRN
  Administered 2015-09-09 – 2015-09-10 (×2): 1000 mg via ORAL
  Filled 2015-09-09 (×2): qty 2

## 2015-09-09 MED ORDER — IBUPROFEN 200 MG PO TABS
400.0000 mg | ORAL_TABLET | Freq: Three times a day (TID) | ORAL | Status: DC | PRN
  Administered 2015-09-10: 400 mg via ORAL
  Filled 2015-09-09 (×2): qty 2

## 2015-09-09 MED ORDER — POTASSIUM CHLORIDE CRYS ER 20 MEQ PO TBCR
40.0000 meq | EXTENDED_RELEASE_TABLET | Freq: Once | ORAL | Status: AC
  Administered 2015-09-09: 40 meq via ORAL
  Filled 2015-09-09: qty 2

## 2015-09-09 MED ORDER — SODIUM CHLORIDE 0.9 % IV SOLN
INTRAVENOUS | Status: AC
  Administered 2015-09-09: 10:00:00 via INTRAVENOUS

## 2015-09-09 NOTE — Care Management Obs Status (Signed)
[facility] NOTIFICATION   Patient Details  Name: Keaghan Rumsey MRN: [id number] Date of Birth: 05/28/1951   Medicare Observation Status Notification Given:  Yes    Dawayne Patricia, RN 09/09/2015, 10:17 AM

## 2015-09-09 NOTE — Progress Notes (Signed)
Pt fell while transferring to [facility]. Pt was found by RN Melody Liane Comber. States she stood up then began to feel dizzy and the room was spinning. Patient had previously been educated multiple times on not getting up without assistance. MD made aware - will come see patient. VSS. Pt placed on bed alarm.  Fritz Pickerel, RN

## 2015-09-09 NOTE — Evaluation (Signed)
Physical Therapy Evaluation Patient Details Name: [name] MRN: [id number] DOB: [birth year] Today's Date: 09/09/2015   History of Present Illness  Patient is a 65 yo F with a PMHx of HTN, DM, hypertriglyceridemia, COPD presenting to the hospital after an episode of syncope today.  Reported illness with N/V/D over past 3 days prior to admission.   Clinical Impression  Patient presents with decreased mobility and high risk for falls due to deficits listed in PT problem list.  She presents with symptoms of both BPPV and possible vertigo of central origin.  Attempted Eply repositioning today.  Patient will benefit from skilled PT in the acute setting to allow return home with family support.  Would also benefit from outpatient vestibular rehab.    Follow Up Recommendations Outpatient PT (for vestibular rehab)    Equipment Recommendations  None recommended by PT    Recommendations for Other Services       Precautions / Restrictions Precautions Precautions: Fall Precaution Comments: fell (lowered herself to floor) just prior to my visit Restrictions Weight Bearing Restrictions: No      Mobility  Bed Mobility Overal bed mobility: Modified Independent                Transfers Overall transfer level: Needs assistance Equipment used: None Transfers: Sit to/from Stand Sit to Stand: Min assist         General transfer comment: for safety/balance  Ambulation/Gait Ambulation/Gait assistance: Min assist Ambulation Distance (Feet): 3 Feet Assistive device: None Gait Pattern/deviations: Step-to pattern     General Gait Details: side steps to head of bed  Stairs            Wheelchair Mobility    Modified Rankin (Stroke Patients Only)       Balance Overall balance assessment: Needs assistance Sitting-balance support: Feet supported Sitting balance-Leahy Scale: Good     Standing balance support: No upper extremity supported Standing balance-Leahy  Scale: Poor Standing balance comment: assist for balance for safety due to pt just fell in the room prior to my visit.                             Pertinent Vitals/Pain Pain Assessment: 0-10 Pain Score: 7  Pain Location: head and tailbone Pain Descriptors / Indicators: Aching Pain Intervention(s): Monitored during session    Home Living Family/patient expects to be discharged to:: Private residence Living Arrangements: Children;Other relatives (grandaughter, fiance, daughter and grandson) Available Help at Discharge: Family Type of Home: House Home Access: Stairs to enter   Entrance Stairs-Number of Steps: 3 Home Layout: Two level;Able to live on main level with bedroom/bathroom Home Equipment: Bedside commode;Walker - 2 wheels;Wheelchair - manual Additional Comments: doesn't go upstairs    Prior Function Level of Independence: Independent         Comments:  not using device prior to admission     Hand Dominance   Dominant Hand: Left    Extremity/Trunk Assessment               Lower Extremity Assessment: Overall WFL for tasks assessed         Communication   Communication: No difficulties  Cognition Arousal/Alertness: Awake/alert Behavior During Therapy: WFL for tasks assessed/performed Overall Cognitive Status: Within Functional Limits for tasks assessed                      General Comments General comments (skin integrity, edema, etc.): Patient  relates spinning sensation that caused her to feel she needed to lower herself to the floor.  Patient positive for L BPPV and likely hypofunction on L.  Treated with canalith repositioning x 1 for L BPPV.   Vestibular Assessment  09/09/15 1551  Symptom Behavior  Type of Dizziness Spinning  Frequency of Dizziness with certain movements  Duration of Dizziness several seconds  Aggravating Factors Forward bending;Supine to sit  Relieving Factors Rest;Slow movements  Occulomotor Exam   Occulomotor Alignment Normal  Spontaneous Absent  Gaze-induced Direction changing nystagmus (mostly L beating wtih L gaze, & w/vertical nyst looking up)  Head shaking Horizontal Absent (but positive for disorientation)  Smooth Pursuits Saccades  Saccades Comment;Poor trajectory (noted nystagmus in certain directions & at times undershoot)  Vestibulo-Occular Reflex  VOR 1 Head Only (x 1 viewing) intact x 30 sec horiz and vertical, but had some mild symptoms  VOR to Slow Head Movement Positive left  VOR Cancellation Corrective saccades (bilateral)  Auditory  Comments intact and equal to scratch test  Positional Testing  Dix-Hallpike Dix-Hallpike Left  Sidelying Test Sidelying Right;Sidelying Left  Dix-Hallpike Left  Dix-Hallpike Left Duration 1 min  Dix-Hallpike Left Symptoms Upbeat, left rotatory nystagmus  Sidelying Right  Sidelying Right Duration 30 sec  Sidelying Right Symptoms No nystagmus  Sidelying Left  Sidelying Left Duration 30 sec  Sidelying Left Symptoms Upbeat, left rotatory nystagmus (about 15 sec)       Exercises        Assessment/Plan    PT Assessment Patient needs continued PT services  PT Diagnosis Other (comment);Generalized weakness;Abnormality of gait (BPPV, central vertigo)   PT Problem List Decreased strength;Decreased activity tolerance;Decreased balance;Decreased mobility;Pain;Decreased safety awareness;Decreased knowledge of use of DME  PT Treatment Interventions DME instruction;Balance training;Gait training;Functional mobility training;Patient/family education;Therapeutic activities;Therapeutic exercise   PT Goals (Current goals can be found in the Care Plan section) Acute Rehab PT Goals Patient Stated Goal: No more falls PT Goal Formulation: With patient Time For Goal Achievement: 09/16/15 Potential to Achieve Goals: Good    Frequency Min 3X/week   Barriers to discharge        Co-evaluation               End of Session    Activity Tolerance: Patient limited by pain Patient left: in bed;with call bell/phone within reach;with bed alarm set;with family/visitor present      Functional Assessment Tool Used: Clinical Judgement Functional Limitation: Mobility: Walking and moving around Mobility: Walking and Moving Around Current Status [id number]): At least 40 percent but less than 60 percent impaired, limited or restricted Mobility: Walking and Moving Around Goal Status [phone]): At least 20 percent but less than 40 percent impaired, limited or restricted    Time: 1440-1522 PT Time Calculation (min) (ACUTE ONLY): 42 min   Charges:   PT Evaluation $PT Eval High Complexity: 1 Procedure PT Treatments $Canalith Rep Proc: 8-22 mins   PT G Codes:   PT G-Codes **NOT FOR INPATIENT CLASS** Functional Assessment Tool Used: Clinical Judgement Functional Limitation: Mobility: Walking and moving around Mobility: Walking and Moving Around Current Status [id number]): At least 40 percent but less than 60 percent impaired, limited or restricted Mobility: Walking and Moving Around Goal Status [phone]): At least 20 percent but less than 40 percent impaired, limited or restricted    Reginia Naas 09/09/2015, 4:04 PM  Magda Kiel, [place] 09/09/2015

## 2015-09-09 NOTE — Progress Notes (Signed)
Subjective: No acute overnight events. Patient states she is having a headache because she did not get her Gabapentin. Reports feeling "dizzy." Denies any nausea or vomiting. States her diarrhea has resolved.   Later in the afternoon I was paged by nursing staff stating patient fell in her room. They checked orthostatic vitals which were normal. I went and spoke to the patient. She told me that when she stood up to use her bedside commode, she experienced a sensation of the room spinning around her. As such, she sat down on the floor next to the hospital bed. Denies any head injury or other trauma associated with this incident. States she has some pain in her "tailbone" but it started at home after her syncopal episode yesterday. Denies any new injuries or any new pain. Denies having any CP, SOB, palpitations, or diaphoresis prior to the episode today. Denies loosing consciousness or blackout. Denies having any focal weakness or numbness. Patient states she has a history of experiencing similar symptoms at home at least twice a month.    Objective: Vital signs in last 24 hours: Filed Vitals:   09/08/15 2008 09/09/15 0335 09/09/15 1349 09/09/15 1400  BP: 115/64 107/60 110/63 130/71  Pulse: 84 75 79 80  Temp: 97.7 F (36.5 C) 98 F (36.7 C) 98.4 F (36.9 C) 98.3 F (36.8 C)  TempSrc: Oral Oral Oral Oral  Resp: 18 18 18 18   Height: 5\' 6"  (1.676 m)     Weight: 188 lb 11.2 oz (85.594 kg)     SpO2: 99% 100% 98% 100%   Weight change:   Intake/Output Summary (Last 24 hours) at 09/09/15 1638 Last data filed at 09/09/15 1349  Gross per 24 hour  Intake   1222 ml  Output    600 ml  Net    622 ml   Physical Exam: Constitutional: She is oriented to person, place, and time. She appears well-developed and well-nourished. No distress.  HENT:  Head: Normocephalic and atraumatic.  Scalp hematoma noted in the right occipital area (similar to yesterday). Eyes: EOM are normal.  Neck: Neck supple.  Normal ROM. No C-spine tenderness.  Cardiovascular: Normal rate, regular rhythm and intact distal pulses. Exam reveals no gallop and no friction rub.  Murmur heard. Grade 1/6 systolic ejection murmur  Pulmonary/Chest: Effort normal and breath sounds normal. No respiratory distress. She has no wheezes. She has no rales.  Abdominal: Soft. Non-tender to palpation. Bowel sounds are normal. She exhibits no distension. There is no rebound and no guarding.   Musculoskeletal: She exhibits no edema. Normal ROM of lumbar spine. Mild tenderness on palpation of the coccyx (same as yesterday as per patient).   Neurological: She is alert and oriented to person, place, and time. No cranial nerve deficit.  Strength and sensation grossly intact in bilateral upper and lower extremities.  Skin: Skin is warm and dry. She is not diaphoretic.  Psychiatric: She has a normal mood and affect. Her behavior is normal.   Lab Results: Basic Metabolic Panel:  Recent Labs Lab 09/08/15 1553 09/09/15 0430  NA 140 140  K 3.6 3.3*  CL 109 112*  CO2 18* 19*  GLUCOSE 194* 143*  BUN 24* 16  CREATININE 1.84* 1.11*  CALCIUM 9.7 8.9   Liver Function Tests:  Recent Labs Lab 09/08/15 2208  AST 30  ALT 28  ALKPHOS 43  BILITOT 0.3  PROT 5.8*  ALBUMIN 3.3*    Recent Labs Lab 09/08/15 2208  LIPASE 38  CBC:  Recent Labs Lab 09/08/15 1553 09/09/15 0430  WBC 10.8* 8.8  NEUTROABS 8.2*  --   HGB 14.3 13.5  HCT 44.0 40.6  MCV 90.2 90.8  PLT 196 160   Cardiac Enzymes:  Recent Labs Lab 09/08/15 1553 09/08/15 2208 09/09/15 0430  TROPONINI 0.04* 0.05* 0.04*   BNP: No results for input(s): PROBNP in the last 168 hours. D-Dimer: No results for input(s): DDIMER in the last 168 hours. CBG:  Recent Labs Lab 09/08/15 1624 09/08/15 2024 09/09/15 0644 09/09/15 1132  GLUCAP 187* 203* 145* 183*   Urine Drug Screen: Drugs of Abuse     Component Value Date/Time   LABOPIA NONE DETECTED  03/09/2013 2346   COCAINSCRNUR NONE DETECTED 03/09/2013 2346   LABBENZ NONE DETECTED 03/09/2013 2346   AMPHETMU NONE DETECTED 03/09/2013 2346   THCU NONE DETECTED 03/09/2013 2346   LABBARB NONE DETECTED 03/09/2013 2346   Micro Results: No results found for this or any previous visit (from the past 240 hour(s)). Studies/Results: Ct Head Wo Contrast  09/08/2015  CLINICAL DATA:  Syncope.  Fall and hit head. EXAM: CT HEAD WITHOUT CONTRAST TECHNIQUE: Contiguous axial images were obtained from the base of the skull through the vertex without intravenous contrast. COMPARISON:  09/28/2013 FINDINGS: Mild low attenuation within the subcortical white matter of the right frontal lobe is noted. No acute cortical infarct, hemorrhage, or mass lesion is present. Ventricles are of normal size. No significant extra-axial fluid collection is present. The paranasal sinuses andmastoid air cells are clear. The osseous skull is intact. the paranasal sinuses are clear. The mastoid air cells are clear. The calvarium is intact. Posterior scalp hematoma is identified measuring approximately 4 cm. IMPRESSION: 1. No acute intracranial abnormalities. 2. Mild small vessel ischemic change. 3. Posterior scalp laceration/hematoma Electronically Signed   By: Kerby Moors M.D.   On: 09/08/2015 16:27   Dg Chest Portable 1 View  09/08/2015  CLINICAL DATA:  Syncope EXAM: PORTABLE CHEST 1 VIEW COMPARISON:  07/11/2014 chest radiograph. FINDINGS: Stable cardiomediastinal silhouette with normal heart size. No pneumothorax. No pleural effusion. Lungs appear clear, with no acute consolidative airspace disease and no pulmonary edema. IMPRESSION: No active disease. Electronically Signed   By: Ilona Sorrel M.D.   On: 09/08/2015 16:35   Medications: I have reviewed the patient's current medications. Scheduled Meds: . aspirin  81 mg Oral Daily  . butalbital-acetaminophen-caffeine  1 tablet Oral QHS  . capsaicin   Topical BID  . enoxaparin  (LOVENOX) injection  40 mg Subcutaneous Q24H  . gabapentin  1,200 mg Oral TID  . insulin aspart  0-5 Units Subcutaneous QHS  . insulin aspart  0-9 Units Subcutaneous TID WC  . rosuvastatin  40 mg Oral q1800  . sodium chloride flush  3 mL Intravenous Q12H   Continuous Infusions: . sodium chloride 125 mL/hr at 09/09/15 1017  . 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 Patient presented with nausea and vomiting for the past 3 days and is not able to tolerate PO intake. Also had 5 episodes of watery diarrhea yesterday. On arrival to the ED, she was found to be hypotensive (  BP 62/46). Cr 1.84 with a baseline of 0.7-0.8. BP improved with bolus of IVF in the ED. Glucose 187.CT head showing no acute intracranial abnormality but showing posterior scalp hematoma/ laceration. Patient states her diarrhea, nausea, and vomiting have resolved. Today she experienced possible vertigo and told me she normally experiences similar symptoms at least twice a month. Denies any head injury or other trauma associated with this incident. Denies having any CP, SOB, palpitations, or diaphoresis prior to the episode today. Denies loosing consciousness or blackout. Denies having any focal weakness, numbness, or tingling. No focal neurological deficits on exam. There was mild tenderness on palpation of the coccyx (stable since yesterday as per patient).  -NS@ 125 cc/hr for 8 hours was ordered this morning. No further IVF needed as patient is able to tolerate PO intake and orthostatics are normal.  -Tylenol prn pain  -Restart home med Fioricet prn  headaches  -PT recommending outpatient vestibular rehab.   Viral gastroenteritis Patient reports having nausea and bilious vomiting for the past 3 days and had 5 episodes of watery non-bloody diarrhea today. States her nausea, vomiting, and diarrhea have now resolved. She is able to tolerate PO intake.  -Carb modified diet   -Zofran 4 mg q6 prn nausea   Elevated troponin Troponin 0.04 > 0.05 > 0.04. No acute changes on EKG. She denies having any CP or SOB.  AKI Cr 1.84 with a baseline of 0.7-0.8. Likely pre-renal azotemia in the setting of viral gastroenteritis (n/v/d). Cr 1.1 today.  -BMET in am   COPD -Albuterol nebulizer  HTN -Hold home BP medications Amlodipine 5 mg daily and Lisinopril 40 mg daily in the setting of hypotension and syncope.   HLD -Crestor 40 mg daily  CAD -Aspirin 81 mg daily  DM -Hold home oral meds Glipizide 10 mg daily, Metformin 1500 mg daily, and Januvia 100 mg daily.  -SSI   Depression -Hold Cymbalta 60 mg daily  Peripheral neuropathy -Restart Gabapentin 1200 mg TID -Capsaicin cream BID  GERD: Protonix 20 mg daily  DVT/PE ppx: Lovenox   Diet: carb modified  Code: DNR (confirmed with patient)  Dispo: Disposition is deferred at this time, awaiting improvement of current medical problems. Anticipated discharge in approximately 1-2 day(s).   The patient does have a current PCP (Rushil Sherrye Payor, MD) and does need an [facility] hospital follow-up appointment after discharge.  The patient does not have transportation limitations that hinder transportation to clinic appointments.  .Services Needed at time of discharge: Y = Yes, Blank = No PT:   OT:   RN:   Equipment:   Other:       Shela Leff, MD 09/09/2015, 4:38 PM

## 2015-09-10 ENCOUNTER — Other Ambulatory Visit: Payer: Self-pay | Admitting: Internal Medicine

## 2015-09-10 DIAGNOSIS — E860 Dehydration: Secondary | ICD-10-CM

## 2015-09-10 DIAGNOSIS — E1110 Type 2 diabetes mellitus with ketoacidosis without coma: Secondary | ICD-10-CM | POA: Insufficient documentation

## 2015-09-10 DIAGNOSIS — K219 Gastro-esophageal reflux disease without esophagitis: Secondary | ICD-10-CM

## 2015-09-10 DIAGNOSIS — G44221 Chronic tension-type headache, intractable: Secondary | ICD-10-CM

## 2015-09-10 LAB — BASIC METABOLIC PANEL WITH GFR
Anion gap: 7 (ref 5–15)
BUN: 8 mg/dL (ref 6–20)
CO2: 23 mmol/L (ref 22–32)
Calcium: 9 mg/dL (ref 8.9–10.3)
Chloride: 113 mmol/L — ABNORMAL HIGH (ref 101–111)
Creatinine, Ser: 1.1 mg/dL — ABNORMAL HIGH (ref 0.44–1.00)
GFR calc Af Amer: >60 mL/min (ref >60)
GFR calc non Af Amer: 53 mL/min — ABNORMAL LOW (ref >60)
Glucose, Bld: 250 mg/dL — ABNORMAL HIGH (ref 65–99)
Potassium: 3.8 mmol/L (ref 3.5–5.1)
Sodium: 143 mmol/L (ref 135–145)

## 2015-09-10 LAB — GLUCOSE, CAPILLARY
GLUCOSE-CAPILLARY: 241 mg/dL — AB (ref 65–99)
Glucose-Capillary: 220 mg/dL — ABNORMAL HIGH (ref 65–99)

## 2015-09-10 NOTE — Progress Notes (Signed)
Patient ID: [name], female   DOB: [birth year], 65 y.o.   MRN: [id number] Medicine attending discharge note: I examined this patient this morning together with resident physician Dr. Shela Leff and I attest to the accuracy of her evaluation and discharge plan as recorded in her progress note dated 09/10/2015.  Clinical summary: 65 year old woman with type 2 diabetes on oral agents diagnosed about 5 years ago. She has had peripheral neuropathy since diagnosis. She has been on gabapentin 1200 mg 3 times daily. She ran out of her gabapentin.CoIncidentally with this she developed nausea, vomiting, diarrhea and headache. She states that this has happened to her in the past when she stopped the drug. She denied any sick contacts. No fever or chills. While walking to her bathroom, she became lightheaded and had a syncopal episode. Daughter heard her fall and called EMS. She was awake and alert. No seizure activity was noted. She is hypertensive but no other history of cardiac disease or arrhythmia. On arrival in the emergency department she was hypotensive with supine blood pressure 62/46. No focal neurologic deficits. BUN 24, creatinine 1.8 compared with baseline 16 and 0.8 recorded in December 2016. Random glucose 187. Anion gap 13. Repeat 9. Oxygen saturation 96-98 percent on room air. Troponin 0.04, 0.05, 0.04. EKG with normal sinus rhythm. No acute ST or T-wave changes. No arrhythmias. CT scan of the brain with no acute abnormalities. A 4 cm posterior scalp hematoma noted. Parenteral saline given to stabilize her blood pressure.  Hospital course: IV hydration continued. Gabapentin resumed. Within 24 hours there was resolution of her nausea vomiting and diarrhea. She continued to have headaches. Of note she has chronic headache problems. Blood sugars in reasonable range. Initial mild dehydration with BUN 24 creatinine 1.8. Post hydration on day of discharge BUN 8 creatinine 1.1. The patient  fell on the floor yesterday despite my instructions to her at time of morning rounds to ask a nurse for assistance if she was going to get up. She struck her tailbone. No ecchymosis.  Disposition: Condition stable at time of discharge. She will follow-up in our Gen[facility] medical clinic. She would like for [place] to arrange for a podiatry evaluation. We will also arrange ear nose and throat evaluation for vertigo. Clinically I think the most likely reason for her orthostasis is underlying autonomic neuropathy and peripheral neuropathy from her diabetes acutely complicated by dehydration.  Complications: Patient fell on the floor and attempt to go to the bathroom unassisted. Trauma to the sacral area with no gross evidence for fracture.

## 2015-09-10 NOTE — Progress Notes (Signed)
Physical Therapy Treatment Patient Details Name: [name] MRN: [id number] DOB: 21-Jun-1950 Today's Date: 09/10/2015    History of Present Illness Patient is a 65 yo F with a PMHx of HTN, DM, hypertriglyceridemia, COPD presenting to the hospital after an episode of syncope today.  Reported illness with N/V/D over past 3 days prior to admission.     PT Comments    Patient continues with L BPPV and treated again with canalith repositioning.  Was safe with ambulation with walker in hallway today.  Will have assist at home and sleeps in recliner so lower risk for provoking BPPV, but she knows to have assist and other techniques if vertigo is provoked until cleared at outpatient PT.  Reports planned d/c today so will defer further treatment to outpatient.  Follow Up Recommendations  Outpatient PT (vestibular rehab)     Equipment Recommendations  None recommended by PT    Recommendations for Other Services       Precautions / Restrictions Precautions Precautions: Fall Restrictions Weight Bearing Restrictions: No    Mobility  Bed Mobility Overal bed mobility: Modified Independent                Transfers Overall transfer level: Needs assistance Equipment used: Rolling walker (2 wheeled) Transfers: Sit to/from Stand Sit to Stand: Supervision         General transfer comment: for safety  Ambulation/Gait Ambulation/Gait assistance: Supervision Ambulation Distance (Feet): 150 Feet Assistive device: Rolling walker (2 wheeled) Gait Pattern/deviations: Step-through pattern;Decreased stride length     General Gait Details: slow pace, cues for visual fixation if needed, but reports no symptoms with ambulation today; appropriate use of walker   Stairs            Wheelchair Mobility    Modified Rankin (Stroke Patients Only)       Balance Overall balance assessment: Needs assistance   Sitting balance-Leahy Scale: Good     Standing balance support:  Bilateral upper extremity supported;No upper extremity supported Standing balance-Leahy Scale: Fair Standing balance comment: can stand without UE support, but safer with walker for ambulation                    Cognition Arousal/Alertness: Awake/alert Behavior During Therapy: WFL for tasks assessed/performed Overall Cognitive Status: Within Functional Limits for tasks assessed                      Exercises      General Comments General comments (skin integrity, edema, etc.): performed sidelying test and noted L rotary nystagmus lasting about 15 sec, treated again with canalith repositioning with 60-75 sec hold in each position.  Patient declined retesting, knew she will be set up for follow up PT at neurorehab center      Pertinent Vitals/Pain Pain Assessment: Faces Faces Pain Scale: Hurts little more Pain Location: tailbone, head Pain Descriptors / Indicators: Sore Pain Intervention(s): Monitored during session;Repositioned    Home Living                      Prior Function            PT Goals (current goals can now be found in the care plan section) Progress towards PT goals: Progressing toward goals    Frequency  Min 3X/week    PT Plan Current plan remains appropriate    Co-evaluation             End of Session Equipment  Utilized During Treatment: Gait belt Activity Tolerance: Patient tolerated treatment well Patient left: in bed;with call bell/phone within reach;with bed alarm set     Time: [id number] PT Time Calculation (min) (ACUTE ONLY): 28 min  Charges:  $Gait Training: 8-22 mins $Canalith Rep Proc: 8-22 mins                    G Codes:  Functional Assessment Tool Used: Clinical Judgement Functional Limitation: Mobility: Walking and moving around Mobility: Walking and Moving Around Current Status [phone]): At least 20 percent but less than 40 percent impaired, limited or restricted Mobility: Walking and Moving Around  Discharge Status [phone]): At least 20 percent but less than 40 percent impaired, limited or restricted   Reginia Naas 09/10/2015, 12:23 PM  Magda Kiel, [place] 09/10/2015

## 2015-09-10 NOTE — Care Management Note (Signed)
Case Management Note Marvetta Gibbons RN, BSN Unit 2W-Case Manager [phone]  Patient Details  Name: Azlin Dugay MRN: [id number] Date of Birth: [birth year]  Subjective/Objective:  Pt admitted with syncope                  Action/Plan: PTA pt lived at [facility] for HH-PT orders placed for vestibular rehab- spoke with pt at bedside choice offered for [facility] agency- per pt she would like to use [facility] services- referral called to Manuela Schwartz with [facility]- vestibular -   Expected Discharge Date:    09/10/15              Expected Discharge Plan:  [place]  In-House Referral:     Discharge planning Services  CM Consult  Post Acute Care Choice:  Home Health Choice offered to:  Patient  DME Arranged:    DME Agency:     HH Arranged:  PT [place]:  [place]  Status of Service:  Completed, signed off  Medicare Important Message Given:    Date Medicare IM Given:    Medicare IM give by:    Date Additional Medicare IM Given:    Additional Medicare Important Message give by:     If discussed at [place] of Stay Meetings, dates discussed:    Additional Comments:  Dawayne Patricia, RN 09/10/2015, 12:33 PM

## 2015-09-10 NOTE — Telephone Encounter (Signed)
Pt needs TOC Appt scheduled for 09/17/15 at 10:45 with dr gill

## 2015-09-10 NOTE — Progress Notes (Signed)
Discussed with the patient and all questioned fully answered. She will call me if any problems arise.  Odean Mcelwain S [place], RN  

## 2015-09-10 NOTE — Progress Notes (Signed)
Post patient fall orthostatic vital signs assessed. Pt expressed that she fell because the 'room was spinning'. Pt BP while sitting was 136/70. Pt BP upon standing was 130/71. Pt unable to tolerate second standing BP.   Fritz Pickerel, RN

## 2015-09-10 NOTE — Progress Notes (Signed)
No transport available- taxi voucher given to [place], [place] Social Worker [phone]

## 2015-09-10 NOTE — Discharge Summary (Signed)
Name: [name] MRN: [id number] DOB: 1950/12/25 65 y.o. PCP: Riccardo Dubin, MD  Date of Admission: 09/08/2015  3:39 PM Date of Discharge: 09/10/15 Attending Physician: No att. 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     amLODipine 5 MG tablet  Commonly known as:  NORVASC  Take 1 tablet (5 mg total) by mouth daily.     aspirin 81 MG tablet  Take 81 mg by mouth every morning.     butalbital-acetaminophen-caffeine 50-325-40 MG tablet  Commonly known as:  FIORICET, ESGIC  Take 1 tablet by mouth at bedtime.     DULoxetine 60 MG capsule  Commonly known as:  CYMBALTA  Take 1 capsule (60 mg total) by mouth every morning.     feeding supplement (GLUCERNA SHAKE) Liqd  Take 237 mLs by mouth 3 (three) times daily between meals.     fluticasone 50 MCG/ACT nasal spray  Commonly known as:  FLONASE  Place 2 sprays into both nostrils daily. Once symptoms are controlled, may reduce dosage to 1 spray per nostril once daily.     gabapentin 600 MG tablet  Commonly known as:  NEURONTIN  Take 2 tablets (1,200 mg total) by mouth 3 (three) times daily.     glipiZIDE 10 MG 24 hr tablet  Commonly known as:  GLUCOTROL XL  Take 1 tablet (10 mg total) by mouth daily with supper.     glucose blood test strip  Commonly known as:  GLUCOSE METER TEST  Use as instructed     lisinopril 40 MG tablet    Commonly known as:  PRINIVIL,ZESTRIL  take 1 tablet by mouth once daily     metFORMIN 750 MG 24 hr tablet  Commonly known as:  GLUCOPHAGE-XR  Take 2 tablets (1,500 mg total) by mouth daily with breakfast.     mupirocin ointment 2 %  Commonly known as:  BACTROBAN  Place 1 application into the nose daily as needed. Apply to foot     nitroGLYCERIN 0.2 mg/hr patch  Commonly known as:  NITRODUR - Dosed in mg/24 hr  Place 1 patch (0.2 mg total) onto the skin daily. Applied to the front of ankle on the right change location daily.     pantoprazole 20 MG tablet  Commonly known as:  PROTONIX  Take 1 tablet (20 mg total) by mouth daily.     rosuvastatin 40 MG tablet  Commonly known as:  CRESTOR  Take 1 tablet (40 mg total) by mouth daily.     sitaGLIPtin 100 MG tablet  Commonly known as:  JANUVIA  Take 1 tablet (100 mg total) by mouth daily.        Disposition and follow-up:   [name] was discharged from [facility] in Good condition.  At the hospital follow up visit please address:  1.  Syncope/ vertigo   2.  Labs / imaging needed at time of follow-up: BMP (check serum creatinine)  3.  Pending labs/ test needing follow-up:  Follow-up Appointments: Follow-up Information    Follow up with Michail Jewels, MD. Go on 09/17/2015.   Specialty:  Internal Medicine   Why:  10:45 am   Contact information:   [name] [id number] [phone]       Follow up with [name].   Why:  HH-PT arranged for Vestibular rehab- they will call you to arrange home visits   Contact information:   [place] [address] [phone]       Discharge Instructions:   Consultations:    Procedures Performed:  Ct Head Wo Contrast  09/08/2015  CLINICAL DATA:  Syncope.  Fall and hit head. EXAM: CT HEAD WITHOUT CONTRAST TECHNIQUE: Contiguous axial images were obtained from the base of the skull through the vertex  without intravenous contrast. COMPARISON:  09/28/2013 FINDINGS: Mild low attenuation within the subcortical white matter of the right frontal lobe is noted. No acute cortical infarct, hemorrhage, or mass lesion is present. Ventricles are of normal size. No significant extra-axial fluid collection is present. The paranasal sinuses andmastoid air cells are clear. The osseous skull is intact. the paranasal sinuses are clear. The mastoid air cells are clear. The calvarium is intact. Posterior scalp hematoma is identified measuring approximately 4 cm. IMPRESSION: 1. No acute intracranial abnormalities. 2. Mild small vessel ischemic change. 3. Posterior scalp laceration/hematoma Electronically Signed   By: Kerby Moors M.D.   On: 09/08/2015 16:27   Dg Chest Portable 1 View  09/08/2015  CLINICAL DATA:  Syncope EXAM: PORTABLE CHEST 1 VIEW COMPARISON:  07/11/2014 chest radiograph. FINDINGS: Stable cardiomediastinal silhouette with normal heart size. No pneumothorax. No pleural effusion. Lungs appear clear, with no acute consolidative airspace disease and no pulmonary edema. IMPRESSION: No active disease. Electronically Signed   By: Ilona Sorrel M.D.   On: 09/08/2015 16:35    2D Echo:   Cardiac Cath:   Admission HPI: Patient is a 65 yo F with a PMHx of HTN, DM, hypertriglyceridemia, COPD presenting to the hospital after an episode of syncope today. States she has been "sick" for the past 3 days, experiencing nausea, bilious vomiting after she ran out of her Gabapentin prescription. States she felt lightheaded/ dizzy today and just remembers walking to the bathroom and all of a sudden her entire body felt heavy. She then woke up with EMS around her. Patient lives her daughter who heard her fall and called EMS. Patient believes she had lost consciousness for approximately 5 mins and was not confused after she woke up. Does report having pain in the back of the head since the fall. Denies any prior history of  syncope. Denies any prior history of seizures. Denies any bowel/ bladder incontinence or tongue biting during this episode. States her nausea/ vomiting resolved after she took gabapentin yesterday. However, started having non-bloody watery diarrhea today - 5 episodes since morning. Denies having any abdominal pain. Denies any recent sick contacts or eating anything new. 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 Denies having any nausea, vomiting, focal weakness or numbness prior to these past 3 days. She does report having 4 TIAs in the past, most recent >3 years ago when she lived in [place]. Denies having any palpitations, CP, or SOB prior to loosing consciousness today.  Vitals per EMS: 91/61, 104 HR, 16 RR, 95% on RA, 218 CBG. On arrival to the ED, she was found to be hypotensive (BP 62/46). Cr 1.84 with a baseline of 0.7-0.8. BP improved with IVF. Glucose 187. Troponin was found to be mildly elevated at 0.04. She was given Aspirin in the ED. CXR normal. CT head showing no acute intracranial abnormality but showing posterior scalp hematoma/ laceration.   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 Patient presented with nausea and vomiting for 3 days prior and 5 episodes of non-bloody watery diarrhea on the day of admission. On arrival to the ED, she was found to be hypotensive (BP 62/46). SCr 1.84 with a baseline of 0.7-0.8. BP improved with bolus of IVF in the ED. Glucose 187.CT head showed no acute intracranial abnormality but did show  posterior scalp hematoma/ laceration. No focal neurological deficits on exam. Troponin 0.04 > 0.05 > 0.04. No acute changes on EKG. Patient denied having any CP, SOB, or palpitations. Her nausea, vomiting, and diarrhea resolved the following day and patient was tolerating PO intake well. However, she did report an episode of symptoms consistent with vertigo ("room was spinning" sensation) during this hospitalization. Orthostatic vitals were checked at the time and were normal. She did tell me that she has a history of similar symptoms occurring about twice a month. She was seen and evaluated by physical therapy and they recommended home health physical therapy with vestibular rehab for which patient has been set up prior to discharge. Serum creatinine trended down to 1.1 and is expected to improve further with better PO intake. On the day of discharge, she was feeling well and told us her granddaughter was there to help her at home. She has been scheduled to follow-up at the [facility] on 09/17/15.   Discharge Vitals:   BP 142/77 mmHg  Pulse 95  Temp(Src) 98.3 F (36.8 C) (Oral)  Resp 20  Ht 5\' 6"  (1.676 m)  Wt 188 lb 11.2 oz (85.594 kg)  BMI 30.47 kg/m2  SpO2 99%  Discharge Labs:  No results found for this or any previous visit (from the past 24 hour(s)).  Signed: Shela Leff, MD 09/13/2015, 10:47 PM    Services Ordered on Discharge: HHPT with vestibular rehab  Equipment Ordered on Discharge:

## 2015-09-10 NOTE — Progress Notes (Signed)
Subjective: No acute overnight events. Still having headaches (chronic problem as per patient). Denies having any nausea, vomiting, or diarrhea. Denies CP, SOB, or palpitations. No other complaints.    Objective: Vital signs in last 24 hours: Filed Vitals:   09/09/15 1349 09/09/15 1400 09/09/15 1942 09/10/15 0438  BP: 110/63 130/71 138/72 113/69  Pulse: 79 80 84 80  Temp: 98.4 F (36.9 C) 98.3 F (36.8 C) 98 F (36.7 C) 98.3 F (36.8 C)  TempSrc: Oral Oral Oral Oral  Resp: 18 18 18 18   Height:      Weight:      SpO2: 98% 100% 100% 98%   Weight change:   Intake/Output Summary (Last 24 hours) at 09/10/15 1116 Last data filed at 09/10/15 [id number]  Gross per 24 hour  Intake   1422 ml  Output      0 ml  Net   1422 ml   Physical Exam: Constitutional: She is oriented to person, place, and time. She appears well-developed and well-nourished. No distress.  Eyes: EOMI Cardiovascular: Normal rate, regular rhythm and intact distal pulses.Exam reveals no gallop and no friction rub.No murmur appreciated on exam today.  Pulmonary/Chest: Effort normal and breath sounds normal. No respiratory distress. She has no wheezes. She has no rales.  Abdominal: Soft. Non-tender to palpation. Bowel sounds are normal. She exhibits no distension. There is no rebound and no guarding.   Musculoskeletal: She exhibits no edema. Normal ROM of lumbar spine. Mild tenderness on palpation of the coccyx (same as before as per patient).  No bruising in the area.  Neurological: She is alert and oriented to person, place, and time. No cranial nerve deficit.  Strength and sensation grossly intact in bilateral upper and lower extremities.  Skin: Skin is warm and dry. She is not diaphoretic.    Lab Results: Basic Metabolic Panel:  Recent Labs Lab 09/09/15 0430 09/10/15 0514  NA 140 143  K 3.3* 3.8  CL 112* 113*  CO2 19* 23  GLUCOSE 143* 250*  BUN 16 8  CREATININE 1.11* 1.10*  CALCIUM 8.9 9.0   Liver  Function Tests:  Recent Labs Lab 09/08/15 2208  AST 30  ALT 28  ALKPHOS 43  BILITOT 0.3  PROT 5.8*  ALBUMIN 3.3*    Recent Labs Lab 09/08/15 2208  LIPASE 38   CBC:  Recent Labs Lab 09/08/15 1553 09/09/15 0430  WBC 10.8* 8.8  NEUTROABS 8.2*  --   HGB 14.3 13.5  HCT 44.0 40.6  MCV 90.2 90.8  PLT 196 160   Cardiac Enzymes:  Recent Labs Lab 09/08/15 1553 09/08/15 2208 09/09/15 0430  TROPONINI 0.04* 0.05* 0.04*   CBG:  Recent Labs Lab 09/08/15 2024 09/09/15 0644 09/09/15 1132 09/09/15 1700 09/09/15 2134 09/10/15 0626  GLUCAP 203* 145* 183* 145* 238* 241*   Urine Drug Screen: Drugs of Abuse     Component Value Date/Time   LABOPIA NONE DETECTED 03/09/2013 2346   COCAINSCRNUR NONE DETECTED 03/09/2013 2346   LABBENZ NONE DETECTED 03/09/2013 2346   AMPHETMU NONE DETECTED 03/09/2013 2346   THCU NONE DETECTED 03/09/2013 2346   LABBARB NONE DETECTED 03/09/2013 2346   Micro Results: No results found for this or any previous visit (from the past 240 hour(s)). Studies/Results: Ct Head Wo Contrast  09/08/2015  CLINICAL DATA:  Syncope.  Fall and hit head. EXAM: CT HEAD WITHOUT CONTRAST TECHNIQUE: Contiguous axial images were obtained from the base of the skull through the vertex without intravenous contrast. COMPARISON:  09/28/2013 FINDINGS: Mild low attenuation within the subcortical white matter of the right frontal lobe is noted. No acute cortical infarct, hemorrhage, or mass lesion is present. Ventricles are of normal size. No significant extra-axial fluid collection is present. The paranasal sinuses andmastoid air cells are clear. The osseous skull is intact. the paranasal sinuses are clear. The mastoid air cells are clear. The calvarium is intact. Posterior scalp hematoma is identified measuring approximately 4 cm. IMPRESSION: 1. No acute intracranial abnormalities. 2. Mild small vessel ischemic change. 3. Posterior scalp laceration/hematoma Electronically  Signed   By: Kerby Moors M.D.   On: 09/08/2015 16:27   Dg Chest Portable 1 View  09/08/2015  CLINICAL DATA:  Syncope EXAM: PORTABLE CHEST 1 VIEW COMPARISON:  07/11/2014 chest radiograph. FINDINGS: Stable cardiomediastinal silhouette with normal heart size. No pneumothorax. No pleural effusion. Lungs appear clear, with no acute consolidative airspace disease and no pulmonary edema. IMPRESSION: No active disease. Electronically Signed   By: Ilona Sorrel M.D.   On: 09/08/2015 16:35   Medications: I have reviewed the patient's current medications. Scheduled Meds: . aspirin  81 mg Oral Daily  . butalbital-acetaminophen-caffeine  1 tablet Oral QHS  . capsaicin   Topical BID  . enoxaparin (LOVENOX) injection  40 mg Subcutaneous Q24H  . gabapentin  1,200 mg Oral TID  . insulin aspart  0-5 Units Subcutaneous QHS  . insulin aspart  0-9 Units Subcutaneous TID WC  . rosuvastatin  40 mg Oral q1800  . sodium chloride flush  3 mL Intravenous Q12H   Continuous Infusions: . 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 Patient presented with nausea and vomiting for the past 3 days and 5 episodes of watery diarrhea on the day of admission. On arrival to the ED, she was found to be hypotensive (BP 62/46). Cr 1.84 with a baseline of 0.7-0.8. BP improved with bolus of IVF in the ED. Glucose 187.CT head showing no acute intracranial abnormality but showing posterior scalp hematoma/ laceration. Patient states her diarrhea, nausea, and vomiting have resolved. She did report a history of symptoms consistent with  vertigo which occur about twice a month. Orthostatic vitals were checked yesterday and were normal. No focal neurological deficits on exam. No CP, SOB, or palpitations. There was mild tenderness on palpation of the coccyx (same as before as per patient). Patient told us her granddaughter is there to help her and that she feels comfortable going home today.  -She will be set up for outpatient vestibular rehab as per PT recommendation. -Tylenol prn pain  -Home med Fioricet prn headaches     Viral gastroenteritis (resolved) Patient reports having nausea and bilious vomiting for the past 3 days and had 5 episodes of watery non-bloody diarrhea today. States her nausea, vomiting, and diarrhea have now resolved. She is able to tolerate PO intake.  -Carb modified diet   -Zofran 4 mg q6 prn nausea   Elevated troponin Troponin 0.04 > 0.05 > 0.04. No acute changes on EKG. She denies having any CP or SOB.  AKI Cr 1.84 with a baseline of 0.7-0.8. Likely pre-renal azotemia in the setting  of viral gastroenteritis (n/v/d). Cr 1.1 today.  -SCr expected to improve with better PO intake   COPD -Albuterol nebulizer  HTN -Hold home BP medications Amlodipine 5 mg daily and Lisinopril 40 mg daily in the setting of hypotension and syncope.   HLD -Crestor 40 mg daily  CAD -Aspirin 81 mg daily  DM -Hold home oral meds Glipizide 10 mg daily, Metformin 1500 mg daily, and Januvia 100 mg daily.  -SSI   Depression -Hold Cymbalta 60 mg daily  Peripheral neuropathy -Restart Gabapentin 1200 mg TID -Capsaicin cream BID  GERD: Protonix 20 mg daily  DVT/PE ppx: Lovenox   Diet: carb modified  Code: DNR (confirmed with patient)  Dispo: Patient is medically stable and will be discharged to home today.   The patient does have a current PCP (Rushil Sherrye Payor, MD) and does need [facility] after discharge.  The patient does not have transportation limitations that hinder  transportation to clinic appointments.  .Services Needed at time of discharge: Y = Yes, Blank = No PT:   OT:   RN:   Equipment:   Other:     LOS: 1 day   Shela Leff, MD 09/10/2015, 11:16 AM

## 2015-09-11 ENCOUNTER — Encounter: Payer: Medicare Other | Admitting: Internal Medicine

## 2015-09-11 ENCOUNTER — Telehealth: Payer: Self-pay

## 2015-09-11 DIAGNOSIS — K219 Gastro-esophageal reflux disease without esophagitis: Secondary | ICD-10-CM | POA: Diagnosis not present

## 2015-09-11 DIAGNOSIS — I10 Essential (primary) hypertension: Secondary | ICD-10-CM | POA: Diagnosis not present

## 2015-09-11 DIAGNOSIS — J449 Chronic obstructive pulmonary disease, unspecified: Secondary | ICD-10-CM | POA: Diagnosis not present

## 2015-09-11 DIAGNOSIS — R42 Dizziness and giddiness: Secondary | ICD-10-CM | POA: Diagnosis not present

## 2015-09-11 DIAGNOSIS — Z89421 Acquired absence of other right toe(s): Secondary | ICD-10-CM | POA: Diagnosis not present

## 2015-09-11 DIAGNOSIS — Z7984 Long term (current) use of oral hypoglycemic drugs: Secondary | ICD-10-CM | POA: Diagnosis not present

## 2015-09-11 DIAGNOSIS — E1142 Type 2 diabetes mellitus with diabetic polyneuropathy: Secondary | ICD-10-CM | POA: Diagnosis not present

## 2015-09-11 DIAGNOSIS — I2510 Atherosclerotic heart disease of native coronary artery without angina pectoris: Secondary | ICD-10-CM | POA: Diagnosis not present

## 2015-09-11 DIAGNOSIS — Z87891 Personal history of nicotine dependence: Secondary | ICD-10-CM | POA: Diagnosis not present

## 2015-09-11 DIAGNOSIS — F329 Major depressive disorder, single episode, unspecified: Secondary | ICD-10-CM | POA: Diagnosis not present

## 2015-09-11 NOTE — Telephone Encounter (Signed)
Shelby Dubin, PT with [facility] would like VO for SW consult and RN eval and treat.  Is VO ok?

## 2015-09-11 NOTE — Telephone Encounter (Signed)
Verbal order is okay by me.

## 2015-09-11 NOTE — Telephone Encounter (Signed)
Attempt 1 at [facility] call- unsuccessful

## 2015-09-12 DIAGNOSIS — F329 Major depressive disorder, single episode, unspecified: Secondary | ICD-10-CM | POA: Diagnosis not present

## 2015-09-12 DIAGNOSIS — E1142 Type 2 diabetes mellitus with diabetic polyneuropathy: Secondary | ICD-10-CM | POA: Diagnosis not present

## 2015-09-12 DIAGNOSIS — J449 Chronic obstructive pulmonary disease, unspecified: Secondary | ICD-10-CM | POA: Diagnosis not present

## 2015-09-12 DIAGNOSIS — I2510 Atherosclerotic heart disease of native coronary artery without angina pectoris: Secondary | ICD-10-CM | POA: Diagnosis not present

## 2015-09-12 DIAGNOSIS — R42 Dizziness and giddiness: Secondary | ICD-10-CM | POA: Diagnosis not present

## 2015-09-12 DIAGNOSIS — I10 Essential (primary) hypertension: Secondary | ICD-10-CM | POA: Diagnosis not present

## 2015-09-14 DIAGNOSIS — E1142 Type 2 diabetes mellitus with diabetic polyneuropathy: Secondary | ICD-10-CM | POA: Diagnosis not present

## 2015-09-14 DIAGNOSIS — J449 Chronic obstructive pulmonary disease, unspecified: Secondary | ICD-10-CM | POA: Diagnosis not present

## 2015-09-14 DIAGNOSIS — I10 Essential (primary) hypertension: Secondary | ICD-10-CM | POA: Diagnosis not present

## 2015-09-14 DIAGNOSIS — I2510 Atherosclerotic heart disease of native coronary artery without angina pectoris: Secondary | ICD-10-CM | POA: Diagnosis not present

## 2015-09-14 DIAGNOSIS — F329 Major depressive disorder, single episode, unspecified: Secondary | ICD-10-CM | POA: Diagnosis not present

## 2015-09-14 DIAGNOSIS — R42 Dizziness and giddiness: Secondary | ICD-10-CM | POA: Diagnosis not present

## 2015-09-14 NOTE — Telephone Encounter (Signed)
VO given.

## 2015-09-15 DIAGNOSIS — I10 Essential (primary) hypertension: Secondary | ICD-10-CM | POA: Diagnosis not present

## 2015-09-15 DIAGNOSIS — I2510 Atherosclerotic heart disease of native coronary artery without angina pectoris: Secondary | ICD-10-CM | POA: Diagnosis not present

## 2015-09-15 DIAGNOSIS — E1142 Type 2 diabetes mellitus with diabetic polyneuropathy: Secondary | ICD-10-CM | POA: Diagnosis not present

## 2015-09-15 DIAGNOSIS — R42 Dizziness and giddiness: Secondary | ICD-10-CM | POA: Diagnosis not present

## 2015-09-15 DIAGNOSIS — F329 Major depressive disorder, single episode, unspecified: Secondary | ICD-10-CM | POA: Diagnosis not present

## 2015-09-15 DIAGNOSIS — J449 Chronic obstructive pulmonary disease, unspecified: Secondary | ICD-10-CM | POA: Diagnosis not present

## 2015-09-15 MED ORDER — BUTALBITAL-APAP-CAFFEINE 50-325-40 MG PO TABS: 1.0000 | ORAL_TABLET | Freq: Every evening | ORAL | Status: DC | PRN

## 2015-09-15 MED ORDER — PANTOPRAZOLE SODIUM 20 MG PO TBEC: 20.0000 mg | DELAYED_RELEASE_TABLET | Freq: Every day | ORAL | Status: DC

## 2015-09-15 NOTE — Telephone Encounter (Signed)
Transition Care Management Follow-up Telephone Call   Date discharged?  3/30   How have you been since you were released from the hospital? "Okay, still having a few dizzy spells" has a constant headache unrelieved by OTC medication or with her TID gabapentin.  Reports her neck is very sore and is black and blue and the knot on her head s/p fall is still rather noticeable.  PT and RN have been to home and she is actively participating in therapy with PT.  Her BP has been 120-130 range.  Reports being out of her glipizide for a month so BS have been 200-300 range, I advised patient there are refills at [facility].  She does need protonix and believes a refill on butabibol would help her head.  I have sent those requests.   Do you understand why you were in the hospital? yes   Do you understand the discharge instructions? Yes    Where were you discharged to? home   Items Reviewed:  Medications reviewed: yes   Allergies reviewed: yes  Dietary changes reviewed: yes   Referrals reviewed: yes    Functional Questionnaire:   Activities of Daily Living (ADLs):   She states they are independent in the following: independent in all ADL activities States they require assistance with the following: na   Any transportation issues/concerns?: No, patient reports she still drives but has a granddaughter who is also able to help with transportation if needed.   Any patient concerns? No other than refills and above- declined an earlier appointment   Confirmed importance and date/time of follow-up visits scheduled yes   Provider Appointment booked with  Dr. Gordy Levan 1045am 09/17/15  Confirmed with patient if condition begins to worsen call PCP or go to the ER.  Patient was given the office number and encouraged to call back with question or concerns.  : yes

## 2015-09-15 NOTE — Telephone Encounter (Signed)
As this patient was seen by me on 07/10/15 and deemed necessary for their treatment as documented in the EHR, I will refill this prescription for Fioricet and Protonix

## 2015-09-16 ENCOUNTER — Telehealth: Payer: Self-pay | Admitting: *Deleted

## 2015-09-16 DIAGNOSIS — R42 Dizziness and giddiness: Secondary | ICD-10-CM | POA: Diagnosis not present

## 2015-09-16 DIAGNOSIS — J449 Chronic obstructive pulmonary disease, unspecified: Secondary | ICD-10-CM | POA: Diagnosis not present

## 2015-09-16 DIAGNOSIS — F329 Major depressive disorder, single episode, unspecified: Secondary | ICD-10-CM | POA: Diagnosis not present

## 2015-09-16 DIAGNOSIS — I2510 Atherosclerotic heart disease of native coronary artery without angina pectoris: Secondary | ICD-10-CM | POA: Diagnosis not present

## 2015-09-16 DIAGNOSIS — I10 Essential (primary) hypertension: Secondary | ICD-10-CM | POA: Diagnosis not present

## 2015-09-16 DIAGNOSIS — E1142 Type 2 diabetes mellitus with diabetic polyneuropathy: Secondary | ICD-10-CM | POA: Diagnosis not present

## 2015-09-16 NOTE — Telephone Encounter (Signed)
Phoned in [place] - pt aware

## 2015-09-16 NOTE — Telephone Encounter (Signed)
HHN calls and states she has concerns after visit w/ pt 1) no appetite, she states pt reports that she drinks coke/ diet coke thru-out day, is not drinking glucerna at this time, nibbles on raw carrots but does not care for anything else 2) fasting cbg's 200- 300 3) has a hematoma on back of head from her fall also on neck has an area of purple, yellow, ecchymosis type, she would like for you to look at it dr gill at pt's visit tomorrow [place] [phone] Sending to dr gill and dr patel

## 2015-09-17 ENCOUNTER — Encounter: Payer: Self-pay | Admitting: Internal Medicine

## 2015-09-17 ENCOUNTER — Ambulatory Visit (INDEPENDENT_AMBULATORY_CARE_PROVIDER_SITE_OTHER): Payer: Medicare Other | Admitting: Internal Medicine

## 2015-09-17 DIAGNOSIS — E119 Type 2 diabetes mellitus without complications: Secondary | ICD-10-CM | POA: Diagnosis not present

## 2015-09-17 DIAGNOSIS — F17210 Nicotine dependence, cigarettes, uncomplicated: Secondary | ICD-10-CM | POA: Diagnosis not present

## 2015-09-17 DIAGNOSIS — I951 Orthostatic hypotension: Secondary | ICD-10-CM | POA: Diagnosis not present

## 2015-09-17 DIAGNOSIS — E118 Type 2 diabetes mellitus with unspecified complications: Secondary | ICD-10-CM

## 2015-09-17 LAB — POCT GLYCOSYLATED HEMOGLOBIN (HGB A1C): Hemoglobin A1C: 8

## 2015-09-17 LAB — GLUCOSE, CAPILLARY: GLUCOSE-CAPILLARY: 281 mg/dL — AB (ref 65–99)

## 2015-09-17 NOTE — Progress Notes (Signed)
Patient ID: Patrici Deslandes, female   DOB: 08-26-1950, 65 y.o.   MRN: [id number]     Subjective:   Patient ID: Shanautica Trush female    DOB: 06-Jun-1951 65 y.o.    MRN: [id number] Health Maintenance Due: Health Maintenance Due  Topic Date Due  . Hepatitis C Screening  [birth year]  . PNEUMOCOCCAL POLYSACCHARIDE VACCINE (1) 04/26/1955  . COLONOSCOPY  04/26/2003  . ZOSTAVAX  04/25/2013  . OPHTHALMOLOGY EXAM  03/08/2015  . HEMOGLOBIN A1C  08/25/2015    _________________________________________________  HPI: Ms.Natascha Weingarten is a 65 y.o. female here for a hospital f/u.  Pt has a PMH outlined below.  Please see problem-based charting assessment and plan for further status of patient's chronic medical problems addressed at today's visit.  PMH: Past Medical History  Diagnosis Date  . Hypertension   . Chronic pain     neck pain, headache, neuropathy  . Hypertriglyceridemia   . Neuromuscular disorder ([place])   . COPD (chronic obstructive pulmonary disease) ([place])   . Puncture wound of foot, right 05/17/2012    Tetanus shot 3 yrs ago at [facility] in [place], per pt report   . Brachial plexus disorders   . CHF (congestive heart failure) ([place])   . Type II diabetes mellitus ([place])     diagnosed around 2010, only ever on metformin  . GERD (gastroesophageal reflux disease)   . Anemia   . Migraine     "monthly" (09/08/2015)  . Arthritis     "left knee" (09/08/2015)  . Depression     "when my husband passed in 2013"    Medications: Current Outpatient Prescriptions on File Prior to Visit  Medication Sig Dispense Refill  . albuterol (PROVENTIL HFA;VENTOLIN HFA) 108 (90 BASE) MCG/ACT inhaler Inhale 1-2 puffs into the lungs every 6 (six) hours as needed for wheezing or shortness of breath. 1 Inhaler 3  . amLODipine (NORVASC) 5 MG tablet Take 1 tablet (5 mg total) by mouth daily. 30 tablet 11  . aspirin 81 MG tablet Take 81 mg by mouth every morning.     . butalbital-acetaminophen-caffeine  (FIORICET, ESGIC) 50-325-40 MG tablet Take 1 tablet by mouth at bedtime as needed for headache. 30 tablet 0  . DULoxetine (CYMBALTA) 60 MG capsule Take 1 capsule (60 mg total) by mouth every morning. 90 capsule 3  . feeding supplement, GLUCERNA SHAKE, (GLUCERNA SHAKE) LIQD Take 237 mLs by mouth 3 (three) times daily between meals. 90 Can 5  . fluticasone (FLONASE) 50 MCG/ACT nasal spray Place 2 sprays into both nostrils daily. Once symptoms are controlled, may reduce dosage to 1 spray per nostril once daily. 9.9 g 0  . gabapentin (NEURONTIN) 600 MG tablet Take 2 tablets (1,200 mg total) by mouth 3 (three) times daily. 180 tablet 6  . glipiZIDE (GLUCOTROL XL) 10 MG 24 hr tablet Take 1 tablet (10 mg total) by mouth daily with supper. 90 tablet 3  . glucose blood (GLUCOSE METER TEST) test strip Use as instructed 100 each 12  . lisinopril (PRINIVIL,ZESTRIL) 40 MG tablet take 1 tablet by mouth once daily 90 tablet 3  . metFORMIN (GLUCOPHAGE-XR) 750 MG 24 hr tablet Take 2 tablets (1,500 mg total) by mouth daily with breakfast. 180 tablet 3  . mupirocin ointment (BACTROBAN) 2 % Place 1 application into the nose daily as needed. Apply to foot    . nitroGLYCERIN (NITRODUR - DOSED IN MG/24 HR) 0.2 mg/hr patch Place 1 patch (0.2 mg total) onto the skin daily. Applied  to the front of ankle on the right change location daily. 30 patch 12  . pantoprazole (PROTONIX) 20 MG tablet Take 1 tablet (20 mg total) by mouth daily. 90 tablet 3  . rosuvastatin (CRESTOR) 40 MG tablet Take 1 tablet (40 mg total) by mouth daily. 90 tablet 3  . sitaGLIPtin (JANUVIA) 100 MG tablet Take 1 tablet (100 mg total) by mouth daily. (Patient taking differently: Take 100 mg by mouth at bedtime. ) 90 tablet 3   No current facility-administered medications on file prior to visit.    Allergies: Allergies  Allergen Reactions  . Flexeril [Cyclobenzaprine] Other (See Comments)    Prolonged QTc to 571, tachycardia  . Soma [Carisoprodol]  Itching and Rash    FH: Family History  Problem Relation Age of Onset  . Hyperlipidemia Mother   . Heart attack Father 42  . Hypertension Father   . Cancer Paternal Grandfather     Lung cancer  . Cancer Maternal Grandmother   . Heart attack Maternal Grandmother     SH: Social History   Social History  . Marital Status: Widowed    Spouse Name: N/A  . Number of Children: N/A  . Years of Education: 14   Occupational History  . unemployed    Social History Main Topics  . Smoking status: Current Every Day Smoker -- 0.50 packs/day for 47 years    Types: Cigarettes  . Smokeless tobacco: Never Used  . Alcohol Use: No  . Drug Use: No  . Sexual Activity: Not Currently   Other Topics Concern  . Not on file   Social History Narrative   Moved to [facility] from [place], shortly after the death of her husband, to live with her daughter.  She notes significant life stress related to her 25 year-old grand-daughter with bipolar disorder.  She has previously worked as a Occupational psychologist.    Review of Systems: Constitutional: Negative for fever, chills and weight loss.  Neurological: +dizziness, neg weakness.     Objective:   Vital Signs: Filed Vitals:   09/17/15 1109  BP: 133/60  Pulse: 88  Temp: 97.9 F (36.6 C)  TempSrc: Oral  Weight: 195 lb 14.4 oz (88.86 kg)  SpO2: 98%      BP Readings from Last 3 Encounters:  09/17/15 133/60  09/10/15 142/77  08/04/15 100/61    Physical Exam: Constitutional: Vital signs reviewed.  Patient is in NAD and cooperative with exam.  Head: Normocephalic and atraumatic. Eyes: EOMI, conjunctivae nl, no scleral icterus.  Neck: Large ecchymoses on right side.  Cardiovascular: RRR, no MRG. Pulmonary/Chest: Normal effort. Neurological: A&O x3, cranial nerves II-XII are grossly intact, moving all extremities. Skin: Ecchymoses and multiple excoriations on legs and arms.    Assessment & Plan:   Assessment and plan was  discussed and formulated with my attending.

## 2015-09-17 NOTE — Patient Instructions (Signed)
Thank you for your visit today.   Please return to the internal medicine clinic tomorrow to follow up with Dr. Posey Pronto for your diabetes management. I would try to cut back on the fioricet as this may be contributing to your headaches.    Please be sure to bring all of your medications with you to every visit; this includes herbal supplements, vitamins, eye drops, and any over-the-counter medications.   Should you have any questions regarding your medications and/or any new or worsening symptoms, please be sure to call the clinic at [phone].   If you believe that you are suffering from a life threatening condition or one that may result in the loss of limb or function, then you should call 911 and proceed to the nearest Emergency Department.   A healthy lifestyle and preventative care can promote health and wellness.   Maintain regular health, dental, and eye exams.  Eat a healthy diet. Foods like vegetables, fruits, whole grains, low-fat dairy products, and lean protein foods contain the nutrients you need without too many calories. Decrease your intake of foods high in solid fats, added sugars, and salt. Get information about a proper diet from your caregiver, if necessary.  Regular physical exercise is one of the most important things you can do for your health. Most adults should get at least 150 minutes of moderate-intensity exercise (any activity that increases your heart rate and causes you to sweat) each week. In addition, most adults need muscle-strengthening exercises on 2 or more days a week.   Maintain a healthy weight. The body mass index (BMI) is a screening tool to identify possible weight problems. It provides an estimate of body fat based on height and weight. Your caregiver can help determine your BMI, and can help you achieve or maintain a healthy weight. For adults 20 years and older:  A BMI below 18.5 is considered underweight.  A BMI of 18.5 to 24.9 is normal.  A  BMI of 25 to 29.9 is considered overweight.  A BMI of 30 and above is considered obese.

## 2015-09-18 ENCOUNTER — Encounter: Payer: Self-pay | Admitting: Internal Medicine

## 2015-09-18 ENCOUNTER — Ambulatory Visit (INDEPENDENT_AMBULATORY_CARE_PROVIDER_SITE_OTHER): Payer: Medicare Other | Admitting: Internal Medicine

## 2015-09-18 VITALS — BP 154/69 | HR 87 | Temp 97.9°F | Ht 66.5 in | Wt 196.7 lb

## 2015-09-18 DIAGNOSIS — S0003XD Contusion of scalp, subsequent encounter: Secondary | ICD-10-CM | POA: Diagnosis not present

## 2015-09-18 DIAGNOSIS — R42 Dizziness and giddiness: Secondary | ICD-10-CM | POA: Diagnosis not present

## 2015-09-18 DIAGNOSIS — F329 Major depressive disorder, single episode, unspecified: Secondary | ICD-10-CM | POA: Diagnosis not present

## 2015-09-18 DIAGNOSIS — E118 Type 2 diabetes mellitus with unspecified complications: Secondary | ICD-10-CM

## 2015-09-18 DIAGNOSIS — X58XXXD Exposure to other specified factors, subsequent encounter: Secondary | ICD-10-CM

## 2015-09-18 DIAGNOSIS — E1142 Type 2 diabetes mellitus with diabetic polyneuropathy: Secondary | ICD-10-CM | POA: Diagnosis not present

## 2015-09-18 DIAGNOSIS — Z7984 Long term (current) use of oral hypoglycemic drugs: Secondary | ICD-10-CM

## 2015-09-18 DIAGNOSIS — I10 Essential (primary) hypertension: Secondary | ICD-10-CM | POA: Diagnosis not present

## 2015-09-18 DIAGNOSIS — J449 Chronic obstructive pulmonary disease, unspecified: Secondary | ICD-10-CM | POA: Diagnosis not present

## 2015-09-18 DIAGNOSIS — E119 Type 2 diabetes mellitus without complications: Secondary | ICD-10-CM | POA: Diagnosis not present

## 2015-09-18 DIAGNOSIS — I2510 Atherosclerotic heart disease of native coronary artery without angina pectoris: Secondary | ICD-10-CM | POA: Diagnosis not present

## 2015-09-18 LAB — GLUCOSE, CAPILLARY: Glucose-Capillary: 177 mg/dL — ABNORMAL HIGH (ref 65–99)

## 2015-09-18 MED ORDER — TRAMADOL HCL 50 MG PO TABS: 50.0000 mg | ORAL_TABLET | Freq: Four times a day (QID) | ORAL | Status: DC | PRN

## 2015-09-18 NOTE — Assessment & Plan Note (Signed)
Overview Over the last 5 days, she reports worsening pain and tenderness to palpation along side the distribution of her hematoma. To treat her pain, she has been taking Excedrin 2-4 times daily with no relief. She denies any blurry vision, nausea, vomiting, changes in her thinking.  Assessment Hematoma of the posterior scalp as noted on most recent head CT. No neurologic symptoms to suggest an intracranial process.  Plan -Recommended conservative pain management with icing and heat -Prescribed tramadol 50 mg every 6 hours as needed 15 tablets

## 2015-09-18 NOTE — Assessment & Plan Note (Addendum)
Overview Home health RN called on 09/16/15 with concerns that the patient had no appetite and was drinking caffeinated beverages throughout the day over Glucerna. Her fasting CBGs trended 200s to 300s. At her hospital follow-up visit yesterday with Dr. Gordy Levan, her A1c was found to be 8.0. Her glucometer readings corroborate with what was reported by the home health RN.  Her current medications include glipizide 10 mg daily with supper, metformin extended release 1500 mg daily with breakfast, sitagliptin 100 mg daily. She ran out of glipizide earlier in the month and had a refill on 08/18/15 that was unaware that he had been refilled.  In terms of her appetite, she acknowledges eating a cucumber this morning and drinking 3 sodas during the day along with 3-4 glasses of water. She cannot afford Glucerna shakes and is requesting samples if we have them on hand.  She does report polyuria and polydipsia, worse since her sugars have been trending up though denies any worsening of her baseline dizziness. No nausea or vomiting.  Assessment Non-insulin-dependent type 2 diabetes, previously well controlled on triple therapy. Her glycemic goal should be A1c 7-8 given her other comorbidities, but I think we have room to improve as she was 6.3 previously on triple therapy. Another consideration is to switch out her sulfonylurea with another medication though a SGLT2 inhibitor would not be ideal given her history of recent fall in the setting of orthostatic hypotension.  Plan -Encouraged her to resume glipizide 10 mg alongside metformin and sitagliptin -Speak with Ms. Butch Penny Plyler about the possibility of exchanging her sulfonylurea for a GLP-1 agonist as it has similar mechanisms with a DPP 4 inhibitor like sitagliptin

## 2015-09-18 NOTE — Patient Instructions (Addendum)
Hematoma  A hematoma is a collection of blood. The collection of blood can turn into a hard, painful lump under the skin. Your skin may turn blue or yellow if the hematoma is close to the surface of the skin. Most hematomas get better in a few days to weeks. Some hematomas are serious and need medical care. Hematomas can be very small or very big.  HOME CARE   Apply ice to the injured area:    Put ice in a plastic bag.    Place a towel between your skin and the bag.    Leave the ice on for 20 minutes, 2-3 times a day for the first 1 to 2 days.   After the first 2 days, switch to using warm packs on the injured area.   Raise (elevate) the injured area to lessen pain and puffiness (swelling). You may also wrap the area with an elastic bandage. Make sure the bandage is not wrapped too tight.   If you have a painful hematoma on your leg or foot, you may use crutches for a couple days.   Only take medicines as told by your doctor.  GET HELP RIGHT AWAY IF:    Your pain gets worse.   Your pain is not controlled with medicine.   You have a fever.   Your puffiness gets worse.   Your skin turns more blue or yellow.   Your skin over the hematoma breaks or starts bleeding.   Your hematoma is in your chest or belly (abdomen) and you are short of breath, feel weak, or have a change in consciousness.   Your hematoma is on your scalp and you have a headache that gets worse or a change in alertness or consciousness.  MAKE SURE YOU:    Understand these instructions.   Will watch your condition.   Will get help right away if you are not doing well or get worse.     This information is not intended to replace advice given to you by your health care provider. Make sure you discuss any questions you have with your health care provider.     Document Released: 07/07/2004 Document Revised: 01/30/2013 Document Reviewed: 11/07/2012  Elsevier Interactive Patient Education 2016 [employer] Inc[employer]

## 2015-09-18 NOTE — Progress Notes (Signed)
   Subjective:    Patient ID: [name], female    DOB: 05/17/1951, 65 y.o.   MRN: [id number]  HPI [name] is a 65 year old female with hypertension, non-insulin-dependent type 2 diabetes complicated by neuropathy and foot ulcer, history of TIA who presents today for diabetes follow-up. Please see assessment & plan for status of chronic medical problems.  She recently purchased a new puppy and was featuring the excoriations on her leg is evidence of it's affection for her, and I cautioned her against the possibility of reinfection given her diabetes to which she acknowledged understanding.  Review of Systems  Gastrointestinal: Negative for nausea and vomiting.  Genitourinary: Positive for frequency.  Neurological: Positive for dizziness and headaches.  Psychiatric/Behavioral: Positive for sleep disturbance. Negative for confusion and decreased concentration.       Objective:   Physical Exam  Constitutional: She is oriented to person, place, and time. She appears well-developed and well-nourished. No distress.  Elderly Caucasian female.  HENT:  Head: Normocephalic.  Ecchymosis overlying right posterior neck and shoulder with tenderness to palpation. Swelling also noted of the posterior scalp which is also tender on palpation. No drainage noted otherwise.  Eyes: Conjunctivae and EOM are normal. No scleral icterus.  Cardiovascular: Normal rate and regular rhythm.   Pulmonary/Chest: Effort normal and breath sounds normal.  Neurological: She is alert and oriented to person, place, and time. No cranial nerve deficit.  Skin: Skin is warm and dry. She is not diaphoretic.  Multiple excoriations noted on bilateral lower extremities consistent with self-report of "new puppy"  Psychiatric: She has a normal mood and affect. Her behavior is normal.          Assessment & Plan:

## 2015-09-20 NOTE — Assessment & Plan Note (Addendum)
-  will f/u with PCP for diabetes mgmt tomorrow, meter downloaded

## 2015-09-20 NOTE — Assessment & Plan Note (Signed)
Pt presents for hospital f/u for syncope.  It was though that possibly she had orthostatic hypotension but orthostatics were negative.  She still feels somewhat dizzy but states that she believes the vestibular rehab is helping somewhat.   -advised to cont vestibular rehab -she is on a lot of meds which may also be contributing? -will need to recheck orthostatics

## 2015-09-21 NOTE — Progress Notes (Signed)
Case discussed with Dr. Gill soon after the resident saw the patient.  We reviewed the resident's history and exam and pertinent patient test results.  I agree with the assessment, diagnosis, and plan of care documented in the resident's note. 

## 2015-09-22 DIAGNOSIS — F329 Major depressive disorder, single episode, unspecified: Secondary | ICD-10-CM | POA: Diagnosis not present

## 2015-09-22 DIAGNOSIS — J449 Chronic obstructive pulmonary disease, unspecified: Secondary | ICD-10-CM | POA: Diagnosis not present

## 2015-09-22 DIAGNOSIS — I10 Essential (primary) hypertension: Secondary | ICD-10-CM | POA: Diagnosis not present

## 2015-09-22 DIAGNOSIS — E1142 Type 2 diabetes mellitus with diabetic polyneuropathy: Secondary | ICD-10-CM | POA: Diagnosis not present

## 2015-09-22 DIAGNOSIS — R42 Dizziness and giddiness: Secondary | ICD-10-CM | POA: Diagnosis not present

## 2015-09-22 DIAGNOSIS — I2510 Atherosclerotic heart disease of native coronary artery without angina pectoris: Secondary | ICD-10-CM | POA: Diagnosis not present

## 2015-09-23 DIAGNOSIS — E1142 Type 2 diabetes mellitus with diabetic polyneuropathy: Secondary | ICD-10-CM | POA: Diagnosis not present

## 2015-09-23 DIAGNOSIS — J449 Chronic obstructive pulmonary disease, unspecified: Secondary | ICD-10-CM | POA: Diagnosis not present

## 2015-09-23 DIAGNOSIS — F329 Major depressive disorder, single episode, unspecified: Secondary | ICD-10-CM | POA: Diagnosis not present

## 2015-09-23 DIAGNOSIS — I10 Essential (primary) hypertension: Secondary | ICD-10-CM | POA: Diagnosis not present

## 2015-09-23 DIAGNOSIS — R42 Dizziness and giddiness: Secondary | ICD-10-CM | POA: Diagnosis not present

## 2015-09-23 DIAGNOSIS — I2510 Atherosclerotic heart disease of native coronary artery without angina pectoris: Secondary | ICD-10-CM | POA: Diagnosis not present

## 2015-09-24 DIAGNOSIS — J449 Chronic obstructive pulmonary disease, unspecified: Secondary | ICD-10-CM | POA: Diagnosis not present

## 2015-09-24 DIAGNOSIS — I2510 Atherosclerotic heart disease of native coronary artery without angina pectoris: Secondary | ICD-10-CM | POA: Diagnosis not present

## 2015-09-24 DIAGNOSIS — I10 Essential (primary) hypertension: Secondary | ICD-10-CM | POA: Diagnosis not present

## 2015-09-24 DIAGNOSIS — E1142 Type 2 diabetes mellitus with diabetic polyneuropathy: Secondary | ICD-10-CM | POA: Diagnosis not present

## 2015-09-24 DIAGNOSIS — R42 Dizziness and giddiness: Secondary | ICD-10-CM | POA: Diagnosis not present

## 2015-09-24 DIAGNOSIS — F329 Major depressive disorder, single episode, unspecified: Secondary | ICD-10-CM | POA: Diagnosis not present

## 2015-09-29 DIAGNOSIS — I10 Essential (primary) hypertension: Secondary | ICD-10-CM | POA: Diagnosis not present

## 2015-09-29 DIAGNOSIS — R42 Dizziness and giddiness: Secondary | ICD-10-CM | POA: Diagnosis not present

## 2015-09-29 DIAGNOSIS — I2510 Atherosclerotic heart disease of native coronary artery without angina pectoris: Secondary | ICD-10-CM | POA: Diagnosis not present

## 2015-09-29 DIAGNOSIS — E1142 Type 2 diabetes mellitus with diabetic polyneuropathy: Secondary | ICD-10-CM | POA: Diagnosis not present

## 2015-09-29 DIAGNOSIS — F329 Major depressive disorder, single episode, unspecified: Secondary | ICD-10-CM | POA: Diagnosis not present

## 2015-09-29 DIAGNOSIS — J449 Chronic obstructive pulmonary disease, unspecified: Secondary | ICD-10-CM | POA: Diagnosis not present

## 2015-10-04 ENCOUNTER — Telehealth: Payer: Self-pay | Admitting: Internal Medicine

## 2015-10-04 NOTE — Telephone Encounter (Signed)
   Reason for call:   I received a call from [name] at 8:17 PM indicating her home blood pressure readings are running low. She monitors her BP at home and reports it was 97/67 on 4/21, 79/51 on 4/22, and 96/47 today. She reports also feeling lightheaded but denies syncope.    Pertinent Data:   She has history of hypertension and is compliant with taking amlodipine 5 mg daily and lisinopril 40 mg daily. She was recently hospitalized from 3/28-30 for syncope thought to be orthostatic hypotension in setting of viral gastroenteritis. She was last seen in [facility] on 4/7 and her blood pressure was elevated at 154/69.    Assessment / Plan / Recommendations:   Due to symptomatic hypotension, I instructed her to hold her amlodipine 5 mg daily and continue her lisinopril 40 mg daily. She is to continue monitoring her blood pressure at home and call us if her blood pressure continues to be low. We may need to cut back on her lisinopril dosage as well. She has upcoming clinic appointment on 10/16/15 with her PCP. Would recommend obtaining orthostatic vital signs at that visit.   As always, pt is advised that if symptoms worsen or new symptoms arise, they should go to an urgent care facility or to to ER for further evaluation.   Juluis Mire, MD   10/04/2015, 8:41 PM

## 2015-10-07 ENCOUNTER — Telehealth: Payer: Self-pay

## 2015-10-07 DIAGNOSIS — I2510 Atherosclerotic heart disease of native coronary artery without angina pectoris: Secondary | ICD-10-CM | POA: Diagnosis not present

## 2015-10-07 DIAGNOSIS — E1142 Type 2 diabetes mellitus with diabetic polyneuropathy: Secondary | ICD-10-CM | POA: Diagnosis not present

## 2015-10-07 DIAGNOSIS — R42 Dizziness and giddiness: Secondary | ICD-10-CM | POA: Diagnosis not present

## 2015-10-07 DIAGNOSIS — F329 Major depressive disorder, single episode, unspecified: Secondary | ICD-10-CM | POA: Diagnosis not present

## 2015-10-07 DIAGNOSIS — I10 Essential (primary) hypertension: Secondary | ICD-10-CM | POA: Diagnosis not present

## 2015-10-07 DIAGNOSIS — J449 Chronic obstructive pulmonary disease, unspecified: Secondary | ICD-10-CM | POA: Diagnosis not present

## 2015-10-07 NOTE — Progress Notes (Signed)
Internal Medicine Clinic Attending  Case discussed with Dr. Patel,Rushil soon after the resident saw the patient.  We reviewed the resident's history and exam and pertinent patient test results.  I agree with the assessment, diagnosis, and plan of care documented in the resident's note. 

## 2015-10-07 NOTE — Telephone Encounter (Signed)
[facility] calls to say pt fell last wed or thurs, no injuries noted, she was climbing over a baby gate when she fell

## 2015-10-07 NOTE — Telephone Encounter (Signed)
Please call back [name] from [facility].

## 2015-10-12 NOTE — Telephone Encounter (Signed)
Thank you for the heads up. I will assess at our visit later this week.

## 2015-10-14 ENCOUNTER — Telehealth: Payer: Self-pay | Admitting: *Deleted

## 2015-10-14 DIAGNOSIS — I10 Essential (primary) hypertension: Secondary | ICD-10-CM | POA: Diagnosis not present

## 2015-10-14 DIAGNOSIS — J449 Chronic obstructive pulmonary disease, unspecified: Secondary | ICD-10-CM | POA: Diagnosis not present

## 2015-10-14 DIAGNOSIS — I2510 Atherosclerotic heart disease of native coronary artery without angina pectoris: Secondary | ICD-10-CM | POA: Diagnosis not present

## 2015-10-14 DIAGNOSIS — F329 Major depressive disorder, single episode, unspecified: Secondary | ICD-10-CM | POA: Diagnosis not present

## 2015-10-14 DIAGNOSIS — R42 Dizziness and giddiness: Secondary | ICD-10-CM | POA: Diagnosis not present

## 2015-10-14 DIAGNOSIS — M1712 Unilateral primary osteoarthritis, left knee: Secondary | ICD-10-CM | POA: Diagnosis not present

## 2015-10-14 DIAGNOSIS — E1142 Type 2 diabetes mellitus with diabetic polyneuropathy: Secondary | ICD-10-CM | POA: Diagnosis not present

## 2015-10-16 ENCOUNTER — Encounter: Payer: Self-pay | Admitting: Internal Medicine

## 2015-10-16 ENCOUNTER — Ambulatory Visit (INDEPENDENT_AMBULATORY_CARE_PROVIDER_SITE_OTHER): Payer: Medicare Other | Admitting: Internal Medicine

## 2015-10-16 ENCOUNTER — Other Ambulatory Visit: Payer: Self-pay | Admitting: Internal Medicine

## 2015-10-16 VITALS — BP 160/89 | HR 77 | Temp 97.8°F | Ht 66.5 in | Wt 198.5 lb

## 2015-10-16 DIAGNOSIS — E118 Type 2 diabetes mellitus with unspecified complications: Secondary | ICD-10-CM

## 2015-10-16 DIAGNOSIS — I10 Essential (primary) hypertension: Secondary | ICD-10-CM

## 2015-10-16 DIAGNOSIS — W010XXD Fall on same level from slipping, tripping and stumbling without subsequent striking against object, subsequent encounter: Secondary | ICD-10-CM | POA: Diagnosis not present

## 2015-10-16 DIAGNOSIS — S0003XD Contusion of scalp, subsequent encounter: Secondary | ICD-10-CM

## 2015-10-16 DIAGNOSIS — Z9181 History of falling: Secondary | ICD-10-CM

## 2015-10-16 DIAGNOSIS — G44229 Chronic tension-type headache, not intractable: Secondary | ICD-10-CM | POA: Diagnosis not present

## 2015-10-16 DIAGNOSIS — R296 Repeated falls: Secondary | ICD-10-CM

## 2015-10-16 DIAGNOSIS — G44221 Chronic tension-type headache, intractable: Secondary | ICD-10-CM

## 2015-10-16 LAB — GLUCOSE, CAPILLARY: GLUCOSE-CAPILLARY: 156 mg/dL — AB (ref 65–99)

## 2015-10-16 MED ORDER — AMLODIPINE BESYLATE 5 MG PO TABS: 2.5000 mg | ORAL_TABLET | Freq: Every day | ORAL | Status: DC

## 2015-10-16 MED ORDER — BUTALBITAL-APAP-CAFFEINE 50-325-40 MG PO TABS: 1.0000 | ORAL_TABLET | Freq: Every evening | ORAL | Status: DC | PRN

## 2015-10-16 NOTE — Assessment & Plan Note (Signed)
Overview In the last 1-2 weeks, she sustained another fall while climbing over the baby gate. She says states that she hurt herself in the same spot as before. She denies any drainage or skin defects and feels that this fall was less severe as the one prior. She still notes exquisite tenderness to the area and is worse whenever it is in contact with a hard surface, like when she is resting her head on the recliner at home. When she attempts to position herself otherwise, she feels her neck muscles give out. She feels tramadol has not alleviated her pain nor has the Excedrin which she has been taking as well. Denies any confusion, memory loss, disorientation.  Assessment Reinjury to prior scalp hematoma without signs of worsening of abscess or skin defect  Plan -Recommended she avoid any over-the-counter analgesic medications for headache to avoid rebound phenomena -Advised her to keep her head covered so as to minimize contact with hard surfaces to which she is agreeable

## 2015-10-16 NOTE — Progress Notes (Signed)
   Subjective:    Patient ID: [name], female    DOB: 03/21/1951, 65 y.o.   MRN: [id number]  HPI [name] is a 65 year old female with hypertension, non-insulin-dependent type 2 diabetes complicated by neuropathy and history of foot ulcer, history of TIA who presents today for diabetes follow-up. Please see assessment & plan for status of chronic medical problems.    Review of Systems  Constitutional: Negative for appetite change.  Respiratory: Negative for shortness of breath.   Cardiovascular: Negative for chest pain.  Neurological: Positive for headaches. Negative for dizziness.       Objective:   Physical Exam  Constitutional: She is oriented to person, place, and time. She appears well-developed and well-nourished. No distress.  Elderly Caucasian female.  HENT:  Head: Normocephalic and atraumatic.  Ecchymosis resolved as compared to prior visit. Swelling also noted of the posterior scalp which is also tender on palpation. No drainage or skin defects noted otherwise.  Eyes: Conjunctivae and EOM are normal. Pupils are equal, round, and reactive to light. No scleral icterus.  Neck: No tracheal deviation present.  Cardiovascular: Normal rate and regular rhythm.  Exam reveals no gallop and no friction rub.   No murmur heard. Pulmonary/Chest: Effort normal and breath sounds normal. No stridor. No respiratory distress. She has no wheezes. She has no rales.  Neurological: She is alert and oriented to person, place, and time. No cranial nerve deficit.  Skin: Skin is warm and dry. She is not diaphoretic.  Psychiatric: She has a normal mood and affect. Her behavior is normal.          Assessment & Plan:

## 2015-10-16 NOTE — Patient Instructions (Addendum)
For your blood pressure, please continue taking lisinopril 40mg  daily along with 1/2 tablet of amlodipine 5mg .  For your scalp hematoma, try to avoid any medications, like Excedrin, and see if covering your head helps.   Please see me back in 1-2 weeks.

## 2015-10-16 NOTE — Assessment & Plan Note (Signed)
Overview Blood pressure today is elevated at 160/89 though her blood pressure log which she has brought with her today shows much improved values, mostly trending 100s to 130s/70s to 80s. On [id number], her systolic blood pressures did drop below 100 at which point she was asked to hold her amlodipine 10 mg. Thereafter, her systolic blood pressures trended in the low 100s until 5/2 which point systolic blood pressure increased to 150. She has had trouble taking amlodipine 5 mg and 10 mg due to dizziness and falls in the past. She denies symptoms of hypertensive urgency, like headache, blurry vision, chest pain, shortness of breath.  Assessment Essential hypertension with suboptimal control on 2 agent therapy. I'm leery of starting a diuretic in her given her history of falls and most recently orthostatic syncope.  Plan -Continue lisinopril 40 mg with amlodipine 2.5 mg daily -Reassess blood pressure in 1-3 weeks

## 2015-10-16 NOTE — Assessment & Plan Note (Signed)
Overview She would like a refill of her Fioricet which she takes 1 tablet as needed at bedtime.  Assessment Chronic tension headaches well controlled on monotherapy. She reports she has not had much relief on traditional first-line agents, like NSAIDs.  Plan Refill Fioricet 30 tablets today

## 2015-10-19 ENCOUNTER — Emergency Department (HOSPITAL_COMMUNITY)
Admission: EM | Admit: 2015-10-19 | Discharge: 2015-10-20 | Disposition: A | Discharge status: Home / Self Care | Payer: Medicare Other | Attending: Emergency Medicine | Admitting: Emergency Medicine

## 2015-10-19 ENCOUNTER — Other Ambulatory Visit: Payer: Self-pay

## 2015-10-19 DIAGNOSIS — F329 Major depressive disorder, single episode, unspecified: Secondary | ICD-10-CM | POA: Diagnosis not present

## 2015-10-19 DIAGNOSIS — G8929 Other chronic pain: Secondary | ICD-10-CM | POA: Insufficient documentation

## 2015-10-19 DIAGNOSIS — R300 Dysuria: Secondary | ICD-10-CM | POA: Diagnosis present

## 2015-10-19 DIAGNOSIS — D649 Anemia, unspecified: Secondary | ICD-10-CM | POA: Insufficient documentation

## 2015-10-19 DIAGNOSIS — E119 Type 2 diabetes mellitus without complications: Secondary | ICD-10-CM | POA: Diagnosis not present

## 2015-10-19 DIAGNOSIS — N390 Urinary tract infection, site not specified: Secondary | ICD-10-CM

## 2015-10-19 DIAGNOSIS — I10 Essential (primary) hypertension: Secondary | ICD-10-CM | POA: Diagnosis not present

## 2015-10-19 DIAGNOSIS — J441 Chronic obstructive pulmonary disease with (acute) exacerbation: Secondary | ICD-10-CM | POA: Diagnosis not present

## 2015-10-19 DIAGNOSIS — Z7984 Long term (current) use of oral hypoglycemic drugs: Secondary | ICD-10-CM | POA: Insufficient documentation

## 2015-10-19 DIAGNOSIS — I509 Heart failure, unspecified: Secondary | ICD-10-CM | POA: Diagnosis not present

## 2015-10-19 DIAGNOSIS — Y9389 Activity, other specified: Secondary | ICD-10-CM | POA: Diagnosis not present

## 2015-10-19 DIAGNOSIS — Y9289 Other specified places as the place of occurrence of the external cause: Secondary | ICD-10-CM | POA: Insufficient documentation

## 2015-10-19 DIAGNOSIS — Y998 Other external cause status: Secondary | ICD-10-CM | POA: Diagnosis not present

## 2015-10-19 DIAGNOSIS — Z79899 Other long term (current) drug therapy: Secondary | ICD-10-CM | POA: Insufficient documentation

## 2015-10-19 DIAGNOSIS — S299XXA Unspecified injury of thorax, initial encounter: Secondary | ICD-10-CM | POA: Diagnosis not present

## 2015-10-19 DIAGNOSIS — S0083XA Contusion of other part of head, initial encounter: Secondary | ICD-10-CM | POA: Diagnosis not present

## 2015-10-19 DIAGNOSIS — R031 Nonspecific low blood-pressure reading: Secondary | ICD-10-CM | POA: Diagnosis not present

## 2015-10-19 DIAGNOSIS — Z7982 Long term (current) use of aspirin: Secondary | ICD-10-CM | POA: Diagnosis not present

## 2015-10-19 DIAGNOSIS — F17210 Nicotine dependence, cigarettes, uncomplicated: Secondary | ICD-10-CM | POA: Diagnosis not present

## 2015-10-19 DIAGNOSIS — R069 Unspecified abnormalities of breathing: Secondary | ICD-10-CM | POA: Diagnosis not present

## 2015-10-19 DIAGNOSIS — S0990XA Unspecified injury of head, initial encounter: Secondary | ICD-10-CM | POA: Diagnosis not present

## 2015-10-19 DIAGNOSIS — W1839XA Other fall on same level, initial encounter: Secondary | ICD-10-CM | POA: Insufficient documentation

## 2015-10-19 NOTE — Progress Notes (Signed)
Internal Medicine Clinic Attending  Case discussed with Dr. Patel,Rushil at the time of the visit.  We reviewed the resident's history and exam and pertinent patient test results.  I agree with the assessment, diagnosis, and plan of care documented in the resident's note.  

## 2015-10-19 NOTE — ED Provider Notes (Signed)
CSN: [id number]     Arrival date & time 10/19/15  2332 History  By signing my name below, I, [name], attest that this documentation has been prepared under the direction and in the presence of Julia Greek, MD. Electronically Signed: Altamease Flowers, ED Scribe. 10/20/2015. 12:20 AM    Chief Complaint  Patient presents with  . Fall   The history is provided by the patient. No language interpreter was used.   Brought in by EMS, [name] is a 65 y.o. female with PMHx of CHF, COPD, DM, HTN, and chronic pain who presents to the Emergency Department complaining of a fall tonight at home. Pt states that she has been feeling generally weak and falling for the last few months. She notes that the last time she was seen in the ED she lost consciousness before falling. Tonight she "just fell" and struck her head but did not lose consciousness. She notes feeling short of breath but not dizzy prior to the fall. She also had no chest pain or palpitations before falling though she notes that her arms and legs feel heavy.  Associated symptoms include headache. She has had right foot pain that is different than her usual neuropathy pain for quite some time.  Pt denies neck pain, chest pain before or after the fall, and palpitations.    Past Medical History  Diagnosis Date  . Hypertension   . Chronic pain     neck pain, headache, neuropathy  . Hypertriglyceridemia   . Neuromuscular disorder ([place])   . COPD (chronic obstructive pulmonary disease) ([place])   . Puncture wound of foot, right 05/17/2012    Tetanus shot 3 yrs ago at [facility] in [place], per pt report   . Brachial plexus disorders   . CHF (congestive heart failure) ([place])   . Type II diabetes mellitus ([place])     diagnosed around 2010, only ever on metformin  . GERD (gastroesophageal reflux disease)   . Anemia   . Migraine     "monthly" (09/08/2015)  . Arthritis     "left knee" (09/08/2015)  . Depression     "when my husband  passed in 2013"   Past Surgical History  Procedure Laterality Date  . Shoulder arthroscopy w/ rotator cuff repair Bilateral   . Carpal tunnel release Bilateral   . 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 Tonsillectomy    . Hematoma evacuation Right 1986 x 2    "OVARY; w/in 1 wk after hysterectomy"  . Bilateral salpingoophorectomy Bilateral 1986    "after hemtoma evacuations; had to cut me open"  . Dilation and curettage of uterus  ~ 1982    S/P miscarriage  . Tubal ligation    . Laparoscopic cholecystectomy    . Vaginal hysterectomy  1986   Family History  Problem Relation Age of Onset  . Hyperlipidemia Mother   . Heart attack Father 25  . Hypertension Father   . Cancer Paternal Grandfather     Lung cancer  . Cancer Maternal Grandmother   . Heart attack Maternal Grandmother    Social History  Substance Use Topics  . Smoking status: Current Every Day Smoker -- 0.50 packs/day for 47 years    Types: Cigarettes  . Smokeless tobacco: Never Used     Comment: less than 1/2 pack per day  . Alcohol Use:  No   OB History    No data available     Review of Systems  Respiratory: Positive for shortness of breath.   Cardiovascular: Negative for chest pain and palpitations.  Musculoskeletal: Positive for arthralgias (right foot). Negative for back pain and neck pain.  Skin: Negative for wound.  Neurological: Positive for headaches. Negative for dizziness.  All other systems reviewed and are negative.  Allergies  Flexeril and Soma  Home Medications   Prior to Admission medications   Medication Sig Start Date End Date Taking? Authorizing Provider  albuterol (PROVENTIL HFA;VENTOLIN HFA) 108 (90 BASE) MCG/ACT inhaler Inhale 1-2 puffs into the lungs every 6 (six) hours as needed for wheezing or shortness of breath. 01/24/14  Yes Rushil Sherrye Payor, MD   amLODipine (NORVASC) 5 MG tablet Take 0.5 tablets (2.5 mg total) by mouth daily. 10/16/15 10/15/16 Yes Rushil Sherrye Payor, MD  aspirin 81 MG tablet Take 81 mg by mouth every morning.    Yes Historical Provider, MD  butalbital-acetaminophen-caffeine (FIORICET, ESGIC) 50-325-40 MG tablet Take 1 tablet by mouth at bedtime as needed for headache. 10/16/15  Yes Rushil Sherrye Payor, MD  DULoxetine (CYMBALTA) 60 MG capsule Take 1 capsule (60 mg total) by mouth every morning. 05/11/15  Yes Alexa Angela Burke, MD  feeding supplement, GLUCERNA SHAKE, (GLUCERNA SHAKE) LIQD Take 237 mLs by mouth 3 (three) times daily between meals. 05/27/15  Yes Rushil Sherrye Payor, MD  fluticasone (FLONASE) 50 MCG/ACT nasal spray Place 2 sprays into both nostrils daily. Once symptoms are controlled, may reduce dosage to 1 spray per nostril once daily. 06/18/15 06/17/16 Yes Milagros Loll, MD  gabapentin (NEURONTIN) 600 MG tablet Take 2 tablets (1,200 mg total) by mouth 3 (three) times daily. 09/07/15  Yes Sid Falcon, MD  glipiZIDE (GLUCOTROL XL) 10 MG 24 hr tablet Take 1 tablet (10 mg total) by mouth daily with supper. 08/18/15  Yes Nischal Dareen Piano, MD  lisinopril (PRINIVIL,ZESTRIL) 40 MG tablet take 1 tablet by mouth once daily 06/17/15  Yes Rushil Sherrye Payor, MD  metFORMIN (GLUCOPHAGE-XR) 750 MG 24 hr tablet Take 2 tablets (1,500 mg total) by mouth daily with breakfast. 01/29/15  Yes Rushil Sherrye Payor, MD  mupirocin ointment (BACTROBAN) 2 % Place 1 application into the nose daily as needed. Apply to foot   Yes Historical Provider, MD  nitroGLYCERIN (NITRODUR - DOSED IN MG/24 HR) 0.2 mg/hr patch Place 1 patch (0.2 mg total) onto the skin daily. Applied to the front of ankle on the right change location daily. 05/23/15  Yes Newt Minion, MD  pantoprazole (PROTONIX) 20 MG tablet Take 1 tablet (20 mg total) by mouth daily. 09/15/15  Yes Rushil Sherrye Payor, MD  rosuvastatin (CRESTOR) 40 MG tablet Take 1 tablet (40 mg total) by mouth daily. 07/10/15  Yes Rushil Sherrye Payor,  MD  sitaGLIPtin (JANUVIA) 100 MG tablet Take 1 tablet (100 mg total) by mouth daily. Patient taking differently: Take 100 mg by mouth at bedtime.  11/13/14  Yes Lucious Groves, DO  cephALEXin (KEFLEX) 500 MG capsule Take 2 capsules (1,000 mg total) by mouth 2 (two) times daily. 10/20/15   Julia Greek, MD  glucose blood (GLUCOSE METER TEST) test strip Use as instructed 08/04/15   Lucious Groves, DO   BP 104/83 mmHg  Pulse 88  Temp(Src) 98.2 F (36.8 C) (Oral)  Resp 18  Ht 5\' 6"  (1.676 m)  SpO2 96% Physical Exam  Constitutional: She is oriented to person, place,  and time. She appears well-developed and well-nourished. No distress.  HENT:  Head: Normocephalic.  Right Ear: Hearing normal.  Left Ear: Hearing normal.  Nose: Nose normal.  Mouth/Throat: Oropharynx is clear and moist and mucous membranes are normal.  Contusion at the right occipital area   Eyes: Conjunctivae and EOM are normal. Pupils are equal, round, and reactive to light.  Neck: Normal range of motion. Neck supple.  Cardiovascular: Regular rhythm, S1 normal and S2 normal.  Exam reveals no gallop and no friction rub.   No murmur heard. Pulmonary/Chest: Effort normal and breath sounds normal. No respiratory distress. She exhibits no tenderness.  Abdominal: Soft. Normal appearance and bowel sounds are normal. There is no hepatosplenomegaly. There is no tenderness. There is no rebound, no guarding, no tenderness at McBurney's point and negative Murphy's sign. No hernia.  Musculoskeletal: Normal range of motion.  Neurological: She is alert and oriented to person, place, and time. She has normal strength. No cranial nerve deficit or sensory deficit. Coordination normal. GCS eye subscore is 4. GCS verbal subscore is 5. GCS motor subscore is 6.  Skin: Skin is warm, dry and intact. No rash noted. No cyanosis.  Psychiatric: She has a normal mood and affect. Her speech is normal and behavior is normal. Thought content normal.   Nursing note and vitals reviewed.   ED Course  Procedures (including critical care time) DIAGNOSTIC STUDIES: Oxygen Saturation is 96% on RA,  normal by my interpretation.    COORDINATION OF CARE: 11:51 PM Discussed treatment plan which includes lab work, CT head without contrast, and CXR with pt at bedside and pt agreed to plan.  Labs Review Labs Reviewed  CBC WITH DIFFERENTIAL/PLATELET - Abnormal; Notable for the following:    RDW 15.6 (*)    All other components within normal limits  COMPREHENSIVE METABOLIC PANEL - Abnormal; Notable for the following:    CO2 21 (*)    Glucose, Bld 156 (*)    Total Protein 6.0 (*)    Albumin 3.3 (*)    Total Bilirubin 0.1 (*)    GFR calc non Af Amer 59 (*)    All other components within normal limits  URINALYSIS, ROUTINE W REFLEX MICROSCOPIC (NOT AT [facility]) - Abnormal; Notable for the following:    APPearance CLOUDY (*)    Leukocytes, UA MODERATE (*)    All other components within normal limits  TROPONIN I - Abnormal; Notable for the following:    Troponin I 0.04 (*)    All other components within normal limits  URINE MICROSCOPIC-ADD ON - Abnormal; Notable for the following:    Squamous Epithelial / LPF 6-30 (*)    Bacteria, UA MANY (*)    All other components within normal limits  URINE CULTURE    Imaging Review Dg Chest 2 View  10/20/2015  CLINICAL DATA:  65 year old female with fall EXAM: CHEST  2 VIEW COMPARISON:  Chest radiograph dated 09/08/2015 FINDINGS: Two views of the chest demonstrate clear lungs. There is no pleural effusion or pneumothorax. The cardiac silhouette is within normal limits. Left shoulder rotator cuff surgical pins noted. No acute fracture. IMPRESSION: No active cardiopulmonary disease. Electronically Signed   By: Anner Crete M.D.   On: 10/20/2015 01:04   Ct Head Wo Contrast  10/20/2015  CLINICAL DATA:  65 year old female with fall and trauma to the head EXAM: CT HEAD WITHOUT CONTRAST TECHNIQUE: Contiguous  axial images were obtained from the base of the skull through the vertex without intravenous  contrast. COMPARISON:  CT dated 09/08/2015 FINDINGS: There is no acute intracranial hemorrhage. There is mild prominence of the ventricles and sulci compatible with age-related atrophy. Mild to moderate periventricular and deep white matter chronic microvascular ischemic changes noted. There is no mass effect or midline shift. The skull all the visualized paranasal sinuses and mastoid air cells are clear. The calvarium is intact. IMPRESSION: No acute intracranial hemorrhage. Mild moderate age-related atrophy and chronic microvascular ischemic changes with no significant interval change. Electronically Signed   By: Anner Crete M.D.   On: 10/20/2015 01:24   I have personally reviewed and evaluated these images and lab results as part of my medical decision-making.   EKG Interpretation   Date/Time:  Monday Oct 19 2015 23:41:26 EDT Ventricular Rate:  82 PR Interval:  165 QRS Duration: 80 QT Interval:  356 QTC Calculation: 416 R Axis:   42 Text Interpretation:  Sinus rhythm Abnormal T, consider ischemia, lateral  leads Confirmed by Mallerie Blok  MD, Chamari Cutbirth ([id number]) on 10/20/2015 12:42:09  AM      MDM   Final diagnoses:  UTI (lower urinary tract infection)   presents to emergency part for evaluation of fall. Patient reports that she lost balance today and fell. There was no loss of consciousness or syncope. Reviewing her records reveals that she was admitted to the hospital for syncope secondary to orthostatic hypotension recently. She has not hypotensive today. All vital signs are normal. Workup has been entirely unremarkable other than urinary tract infection. Patient likely experiencing some mild weakness secondary to UTI. She does not appear to be septic. Patient initiated on Rocephin and will continue outpatient treatment with Keflex. Urine culture pending. Follow-up at internal medicine clinic.  I  personally performed the services described in this documentation, which was scribed in my presence. The recorded information has been reviewed and is accurate.    Julia Greek, MD 10/20/15 [phone]

## 2015-10-20 ENCOUNTER — Emergency Department (HOSPITAL_COMMUNITY): Payer: Medicare Other

## 2015-10-20 ENCOUNTER — Encounter (HOSPITAL_COMMUNITY): Payer: Self-pay | Admitting: Emergency Medicine

## 2015-10-20 DIAGNOSIS — S299XXA Unspecified injury of thorax, initial encounter: Secondary | ICD-10-CM | POA: Diagnosis not present

## 2015-10-20 DIAGNOSIS — S0990XA Unspecified injury of head, initial encounter: Secondary | ICD-10-CM | POA: Diagnosis not present

## 2015-10-20 DIAGNOSIS — N390 Urinary tract infection, site not specified: Secondary | ICD-10-CM | POA: Diagnosis not present

## 2015-10-20 LAB — COMPREHENSIVE METABOLIC PANEL
ALBUMIN: 3.3 g/dL — AB (ref 3.5–5.0)
ALT: 47 U/L (ref 14–54)
ANION GAP: 14 (ref 5–15)
AST: 28 U/L (ref 15–41)
Alkaline Phosphatase: 47 U/L (ref 38–126)
BILIRUBIN TOTAL: 0.1 mg/dL — AB (ref 0.3–1.2)
BUN: 19 mg/dL (ref 6–20)
CHLORIDE: 105 mmol/L (ref 101–111)
CO2: 21 mmol/L — AB (ref 22–32)
Calcium: 9 mg/dL (ref 8.9–10.3)
Creatinine, Ser: 1 mg/dL (ref 0.44–1.00)
GFR calc Af Amer: >60 mL/min (ref >60)
GFR calc non Af Amer: 59 mL/min — ABNORMAL LOW (ref >60)
GLUCOSE: 156 mg/dL — AB (ref 65–99)
POTASSIUM: 4 mmol/L (ref 3.5–5.1)
SODIUM: 140 mmol/L (ref 135–145)
TOTAL PROTEIN: 6 g/dL — AB (ref 6.5–8.1)

## 2015-10-20 LAB — CBC WITH DIFFERENTIAL/PLATELET
BASOS ABS: 0 10*3/uL (ref 0.0–0.1)
BASOS PCT: 0 %
Eosinophils Absolute: 0.3 10*3/uL (ref 0.0–0.7)
Eosinophils Relative: 4 %
HEMATOCRIT: 39.7 % (ref 36.0–46.0)
Hemoglobin: 12.4 g/dL (ref 12.0–15.0)
Lymphocytes Relative: 23 %
Lymphs Abs: 2 10*3/uL (ref 0.7–4.0)
MCH: 29.7 pg (ref 26.0–34.0)
MCHC: 31.2 g/dL (ref 30.0–36.0)
MCV: 95 fL (ref 78.0–100.0)
MONO ABS: 0.7 10*3/uL (ref 0.1–1.0)
Monocytes Relative: 8 %
NEUTROS ABS: 5.6 10*3/uL (ref 1.7–7.7)
Neutrophils Relative %: 65 %
PLATELETS: 176 10*3/uL (ref 150–400)
RBC: 4.18 MIL/uL (ref 3.87–5.11)
RDW: 15.6 % — AB (ref 11.5–15.5)
WBC: 8.6 10*3/uL (ref 4.0–10.5)

## 2015-10-20 LAB — URINALYSIS, ROUTINE W REFLEX MICROSCOPIC
Bilirubin Urine: NEGATIVE
GLUCOSE, UA: NEGATIVE mg/dL
Hgb urine dipstick: NEGATIVE
Ketones, ur: NEGATIVE mg/dL
Nitrite: NEGATIVE
PROTEIN: NEGATIVE mg/dL
Specific Gravity, Urine: 1.015 (ref 1.005–1.030)
pH: 6.5 (ref 5.0–8.0)

## 2015-10-20 LAB — URINE MICROSCOPIC-ADD ON

## 2015-10-20 LAB — TROPONIN I: TROPONIN I: 0.04 ng/mL — AB (ref <0.031)

## 2015-10-20 MED ORDER — CEPHALEXIN 500 MG PO CAPS: 1000.0000 mg | ORAL_CAPSULE | Freq: Two times a day (BID) | ORAL | Status: DC

## 2015-10-20 MED ORDER — LIDOCAINE HCL (PF) 1 % IJ SOLN
INTRAMUSCULAR | Status: AC
  Administered 2015-10-20: 2 mL
  Filled 2015-10-20: qty 5

## 2015-10-20 MED ORDER — CEFTRIAXONE SODIUM 1 G IJ SOLR
1.0000 g | Freq: Once | INTRAMUSCULAR | Status: AC
  Administered 2015-10-20: 1 g via INTRAMUSCULAR
  Filled 2015-10-20: qty 10

## 2015-10-20 NOTE — ED Notes (Signed)
Pt arrived to ED via [facility]. Reported falling tonight after turning around. Says she has been falling every week for the last 4 weeks. Small bump present to back of head. Bump was present from previous fall. Vital signs stable at 104/63, Pulse 88 Resp-16. Pt also reports SOB. No LOC. No complaints of back or neck pain.

## 2015-10-20 NOTE — Discharge Instructions (Signed)

## 2015-10-20 NOTE — ED Notes (Signed)
Patient transported to X-ray 

## 2015-10-22 DIAGNOSIS — J449 Chronic obstructive pulmonary disease, unspecified: Secondary | ICD-10-CM | POA: Diagnosis not present

## 2015-10-22 DIAGNOSIS — I2510 Atherosclerotic heart disease of native coronary artery without angina pectoris: Secondary | ICD-10-CM | POA: Diagnosis not present

## 2015-10-22 DIAGNOSIS — I10 Essential (primary) hypertension: Secondary | ICD-10-CM | POA: Diagnosis not present

## 2015-10-22 DIAGNOSIS — F329 Major depressive disorder, single episode, unspecified: Secondary | ICD-10-CM | POA: Diagnosis not present

## 2015-10-22 DIAGNOSIS — R42 Dizziness and giddiness: Secondary | ICD-10-CM | POA: Diagnosis not present

## 2015-10-22 DIAGNOSIS — E1142 Type 2 diabetes mellitus with diabetic polyneuropathy: Secondary | ICD-10-CM | POA: Diagnosis not present

## 2015-10-22 LAB — URINE CULTURE: Culture: 40000 — AB

## 2015-10-23 ENCOUNTER — Telehealth: Payer: Self-pay | Admitting: *Deleted

## 2015-10-23 NOTE — ED Notes (Signed)
Post ED Visit - Positive Culture Follow-up  Culture report reviewed by antimicrobial stewardship pharmacist:  []  Elenor Quinones, Pharm.D. []  Heide Guile, Pharm.D., BCPS []  Parks Neptune, Pharm.D. [x]  Alycia Rossetti, Pharm.D., BCPS []  [place], [place].D., BCPS, AAHIVP []  Legrand Como, Pharm.D., BCPS, AAHIVP []  Milus Glazier, Pharm.D. []  Stephens November, [place].D.  Positive urine culture Treated with Cephalexin, organism sensitive to the same and no further patient follow-up is required at this time.  Harlon Flor [facility] 10/23/2015, 11:24 AM

## 2015-10-30 ENCOUNTER — Ambulatory Visit: Payer: BLUE CROSS/BLUE SHIELD | Admitting: Internal Medicine

## 2015-10-30 ENCOUNTER — Other Ambulatory Visit: Payer: Self-pay

## 2015-10-30 DIAGNOSIS — E1142 Type 2 diabetes mellitus with diabetic polyneuropathy: Secondary | ICD-10-CM

## 2015-10-30 MED ORDER — GABAPENTIN 600 MG PO TABS: 1200.0000 mg | ORAL_TABLET | Freq: Three times a day (TID) | ORAL | Status: DC
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
## 2015-10-30 NOTE — Telephone Encounter (Signed)
Pt requesting the nurse to call back regarding gabapentin.

## 2015-10-30 NOTE — Telephone Encounter (Signed)
I will go ahead and refill the medication. Quantity and report appear consistent.

## 2015-11-04 ENCOUNTER — Ambulatory Visit (INDEPENDENT_AMBULATORY_CARE_PROVIDER_SITE_OTHER): Payer: Medicare Other | Admitting: Internal Medicine

## 2015-11-04 ENCOUNTER — Encounter: Payer: Self-pay | Admitting: Internal Medicine

## 2015-11-04 ENCOUNTER — Ambulatory Visit (INDEPENDENT_AMBULATORY_CARE_PROVIDER_SITE_OTHER): Payer: Medicare Other | Admitting: Dietician

## 2015-11-04 VITALS — BP 148/71 | HR 76 | Temp 98.3°F | Ht 66.5 in | Wt 200.8 lb

## 2015-11-04 DIAGNOSIS — Z1159 Encounter for screening for other viral diseases: Secondary | ICD-10-CM | POA: Diagnosis not present

## 2015-11-04 DIAGNOSIS — L97502 Non-pressure chronic ulcer of other part of unspecified foot with fat layer exposed: Secondary | ICD-10-CM

## 2015-11-04 DIAGNOSIS — J449 Chronic obstructive pulmonary disease, unspecified: Secondary | ICD-10-CM | POA: Diagnosis not present

## 2015-11-04 DIAGNOSIS — L989 Disorder of the skin and subcutaneous tissue, unspecified: Secondary | ICD-10-CM

## 2015-11-04 DIAGNOSIS — E11621 Type 2 diabetes mellitus with foot ulcer: Secondary | ICD-10-CM

## 2015-11-04 DIAGNOSIS — B351 Tinea unguium: Secondary | ICD-10-CM

## 2015-11-04 DIAGNOSIS — Z9181 History of falling: Secondary | ICD-10-CM

## 2015-11-04 DIAGNOSIS — R42 Dizziness and giddiness: Secondary | ICD-10-CM | POA: Diagnosis not present

## 2015-11-04 DIAGNOSIS — E119 Type 2 diabetes mellitus without complications: Secondary | ICD-10-CM | POA: Diagnosis not present

## 2015-11-04 DIAGNOSIS — Z713 Dietary counseling and surveillance: Secondary | ICD-10-CM

## 2015-11-04 DIAGNOSIS — F329 Major depressive disorder, single episode, unspecified: Secondary | ICD-10-CM | POA: Diagnosis not present

## 2015-11-04 DIAGNOSIS — R51 Headache: Secondary | ICD-10-CM | POA: Diagnosis not present

## 2015-11-04 DIAGNOSIS — Z7984 Long term (current) use of oral hypoglycemic drugs: Secondary | ICD-10-CM

## 2015-11-04 DIAGNOSIS — Z1211 Encounter for screening for malignant neoplasm of colon: Secondary | ICD-10-CM | POA: Insufficient documentation

## 2015-11-04 DIAGNOSIS — I10 Essential (primary) hypertension: Secondary | ICD-10-CM | POA: Diagnosis not present

## 2015-11-04 DIAGNOSIS — E1169 Type 2 diabetes mellitus with other specified complication: Secondary | ICD-10-CM | POA: Insufficient documentation

## 2015-11-04 DIAGNOSIS — E1142 Type 2 diabetes mellitus with diabetic polyneuropathy: Secondary | ICD-10-CM | POA: Diagnosis not present

## 2015-11-04 DIAGNOSIS — I2510 Atherosclerotic heart disease of native coronary artery without angina pectoris: Secondary | ICD-10-CM | POA: Diagnosis not present

## 2015-11-04 DIAGNOSIS — L609 Nail disorder, unspecified: Secondary | ICD-10-CM | POA: Diagnosis not present

## 2015-11-04 DIAGNOSIS — G44221 Chronic tension-type headache, intractable: Secondary | ICD-10-CM

## 2015-11-04 LAB — GLUCOSE, CAPILLARY: Glucose-Capillary: 188 mg/dL — ABNORMAL HIGH (ref 65–99)

## 2015-11-04 MED ORDER — BUTALBITAL-APAP-CAFFEINE 50-325-40 MG PO TABS: 1.0000 | ORAL_TABLET | Freq: Every evening | ORAL | Status: DC | PRN
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
## 2015-11-04 NOTE — Assessment & Plan Note (Signed)
Assessment She is requesting a refill of butalbital/acetaminophen/caffeine 50/325/40 mg tablets. In setting of her recent fall, she has been experiencing more headaches has found this medication helpful for when she falls asleep.  Plan -Refilled butalbital/acetaminophen/caffeine 50/325/40 mg tablets. -Unclear if this medication does populate on the [place] so will have pharmacy check refill history for [place]. -Plan to check urine drug screen at follow-up visit since she has not had one checked since we have initiated controlled substance contract.

## 2015-11-04 NOTE — Assessment & Plan Note (Addendum)
Assessment She has blistering skin lesions noted of some exposed areas, notably on the dorsal aspects of her arms bilaterally. She reports her mother had similar physical exam findings as well. Though she denies prior history of illicit drug use, she did work in a hospital for issues with developmental delays and feels she may have been at risk for hepatitis C.  Skin findings do raise concern for porphyria, so it is imperative we exclude hepatitis C as a causative agent. She is also eligible due to her birth year for screening.  Plan -Check HCV  ADDENDUM 11/10/2015  11:28 AM:  HCV negative.

## 2015-11-04 NOTE — Assessment & Plan Note (Signed)
Assessment For the last month, she is noted darkening of her second and third toes. She denies any pain or trauma associated with these digits though does have baseline neuropathy in the setting of her diabetes. She is wondering if this has something to do with the toe amputation she had back in November on the same foot. She denies a history of peripheral vascular disease though knows her mom had poor circulation in her legs. She does report less than half a pack per day history of smoking daily.  Physical exam findings consistent with onychomycosis. Changes do not appear ischemic as discoloration is limited only to the nailbeds.  Plan -Refer to podiatry. Appears referral was placed back in January though was not made as it was for orthotic fitting which she was able to complete paperwork for her orthopedist.

## 2015-11-04 NOTE — Progress Notes (Signed)
I saw and evaluated the patient.  I personally confirmed the key portions of Dr. Patel's history and exam and reviewed pertinent patient test results.  The assessment, diagnosis, and plan were formulated together and I agree with the documentation in the resident's note. 

## 2015-11-04 NOTE — Patient Instructions (Signed)
Changes to help lower your blood sugar to about 130 before meals and an average of 150-160 mg/dl  1. Take the glipizide in the morning and eat something about 30 minutes later - yogurt, oatmeal, fruit, soymilk  2. Check blood sugar more often to know what these changes so.  I will work on the cost of your strips and let you know what I find out.   Please bring your meter to your visit on June 21st at 9:30 AM.

## 2015-11-04 NOTE — Progress Notes (Addendum)
   Subjective:    Patient ID: [name], female    DOB: 08/01/1950, 65 y.o.   MRN: [id number]  HPI [name] is a 65 year old female with hypertension, non-insulin-dependent type 2 diabetes complicated by neuropathy and history of foot ulcer, history of TIA who presents today for diabetes follow-up. Please see assessment & plan for status of chronic medical problems.   Review of Systems  Constitutional: Negative for appetite change.  Respiratory: Negative for shortness of breath.   Cardiovascular: Negative for chest pain.  Skin: Positive for color change.  Neurological: Positive for headaches. Negative for dizziness.  Psychiatric/Behavioral: Positive for sleep disturbance.       Objective:   Physical Exam  Constitutional: She is oriented to person, place, and time. She appears well-developed and well-nourished. No distress.  HENT:  Head: Normocephalic and atraumatic.  Eyes: Conjunctivae are normal. No scleral icterus.  Neck: No tracheal deviation present.  Cardiovascular: Normal rate and regular rhythm.  Exam reveals no gallop and no friction rub.   No murmur heard. Pulmonary/Chest: Effort normal. No stridor. No respiratory distress. She has wheezes (Bibasilar). She has no rales.  Neurological: She is alert and oriented to person, place, and time.  Skin: Skin is warm and dry. Rash (Blistering noted in the skin on the dorsal aspect of arms bilaterally) noted. She is not diaphoretic.  Discoloration of the nail beds of the second and third digits. Thickening also noted. Posterior tibialis 1+ dorsalis pedis unable to be palpated.          Assessment & Plan:

## 2015-11-04 NOTE — Patient Instructions (Addendum)
For your foot doctor and screening colonoscopy, we will be in touch with you.  Let's see each other back in July. Please don't forget to call us for refills!

## 2015-11-04 NOTE — Assessment & Plan Note (Signed)
Assessment Eligible for annual screening colonoscopy which she is agreeable to getting.  Plan -Refer for screening colonoscopy

## 2015-11-05 ENCOUNTER — Telehealth: Payer: Self-pay | Admitting: Dietician

## 2015-11-05 LAB — HEPATITIS C ANTIBODY

## 2015-11-05 NOTE — Telephone Encounter (Signed)
Patient says she would like to check her blood sugars more often, but doesn't understand why the cost so much  and cannot afford test strips at 20$ per month.  [facility]ed [facility] about cost of strips, they need a form (Detailed written order)completed   and her strips will be 0$.  Patient called and notified via message and she was asked to call us back if she has further problems.

## 2015-11-10 ENCOUNTER — Telehealth: Payer: Self-pay

## 2015-11-10 ENCOUNTER — Telehealth: Payer: Self-pay | Admitting: Internal Medicine

## 2015-11-10 DIAGNOSIS — K219 Gastro-esophageal reflux disease without esophagitis: Secondary | ICD-10-CM

## 2015-11-10 MED ORDER — PANTOPRAZOLE SODIUM 20 MG PO TBEC: 20.0000 mg | DELAYED_RELEASE_TABLET | Freq: Every day | ORAL | Status: DC | PRN

## 2015-11-10 NOTE — Addendum Note (Signed)
Addended by: Riccardo Dubin on: 11/10/2015 02:02 PM   Modules accepted: Orders

## 2015-11-10 NOTE — Telephone Encounter (Signed)
Pt is returning a phone call. Please call back.

## 2015-11-10 NOTE — Telephone Encounter (Signed)
  Reason for call:   I placed an outgoing call to [name] at 1139  AM regarding her dosing of Protonix.   Assessment/ Plan:   She was not at home but will call us back later.  I was going to advise her to take her Protonix at least 30 minutes before meals on an as needed basis to prevent any nutritional deficiency.   Julia Dubin, MD   11/10/2015, 11:39 AM

## 2015-11-10 NOTE — Telephone Encounter (Signed)
Spoke with patient and gave her your instructions for the Protonix. She verbalized understanding. Also reported the negative Hepatitis C result per her request.

## 2015-11-19 DIAGNOSIS — H40013 Open angle with borderline findings, low risk, bilateral: Secondary | ICD-10-CM | POA: Diagnosis not present

## 2015-11-19 DIAGNOSIS — E119 Type 2 diabetes mellitus without complications: Secondary | ICD-10-CM | POA: Diagnosis not present

## 2015-11-19 DIAGNOSIS — H2513 Age-related nuclear cataract, bilateral: Secondary | ICD-10-CM | POA: Diagnosis not present

## 2015-11-19 DIAGNOSIS — H5051 Esophoria: Secondary | ICD-10-CM | POA: Diagnosis not present

## 2015-11-19 LAB — HM DIABETES EYE EXAM

## 2015-11-23 DIAGNOSIS — M1712 Unilateral primary osteoarthritis, left knee: Secondary | ICD-10-CM | POA: Diagnosis not present

## 2015-11-30 ENCOUNTER — Ambulatory Visit (INDEPENDENT_AMBULATORY_CARE_PROVIDER_SITE_OTHER): Payer: Medicare Other | Admitting: Podiatry

## 2015-11-30 ENCOUNTER — Encounter: Payer: Self-pay | Admitting: Podiatry

## 2015-11-30 VITALS — BP 116/80 | HR 79 | Resp 18

## 2015-11-30 DIAGNOSIS — Z899 Acquired absence of limb, unspecified: Secondary | ICD-10-CM | POA: Diagnosis not present

## 2015-11-30 DIAGNOSIS — S90221A Contusion of right lesser toe(s) with damage to nail, initial encounter: Secondary | ICD-10-CM | POA: Diagnosis not present

## 2015-11-30 DIAGNOSIS — L97511 Non-pressure chronic ulcer of other part of right foot limited to breakdown of skin: Secondary | ICD-10-CM | POA: Diagnosis not present

## 2015-11-30 DIAGNOSIS — E1149 Type 2 diabetes mellitus with other diabetic neurological complication: Secondary | ICD-10-CM

## 2015-11-30 NOTE — Progress Notes (Signed)
   Subjective:    Patient ID: [name], female    DOB: 07/21/1950, 65 y.o.   MRN: [id number]  HPI  65 year old female presents the office they for concerns of black spots in her right second and third toenail which is been ongoing for about 2 weeks. This started after she trimmed the toenails herself. She denies any redness or drainage or any swelling to the toes. She also states that she had a callus on the ball of her right foot which she got a knife and cut the area herself and now she has a wound on her right foot. Denies any drainage or pus or any redness or swelling.  Review of Systems  All other systems reviewed and are negative.      Objective:   Physical Exam General: AAO x3, NAD  Dermatological: Right second third digit toenails are hypertrophic and dystrophic with discoloration to the toenail which appears to be several hematoma. There is abrasions around the toenail from where she try to cut toenails herself which resulted superficial wounds. There is no pus or drainage. There is no surrounding erythema, ascending cellulitis at this time. On the right foot sub-metatarsal 5 is a superficial wound measuring about 1 x 0.5 cm which is superficial. There is no probing, undermining or tunneling. This is over the area at the previous lesion that she trims herself. No other open lesions.  Vascular: Dorsalis Pedis artery and Posterior Tibial artery pedal pulses are palpable bilateral with immedate capillary fill time. There is no pain with calf compression, swelling, warmth, erythema.   Neruologic: Decreased sensation with Derrel Nip monofilament  Musculoskeletal: Previous right partial first ray amputation. Hammertoe contractures present. No pain, crepitus, or limitation noted with foot and ankle range of motion bilateral. Muscular strength 5/5 in all groups tested bilateral.  Gait: Unassisted, Nonantalgic.       Assessment & Plan:  65 year old female right second, third  digit onychodystrophy/somewhat mobile hematoma; right side metatarsal 5 superficial wound currently without signs of infection to the feet. -Treatment options discussed including all alternatives, risks, and complications -Etiology of symptoms were discussed -The nails were smoothed down however there did not elongated. Discussed her not to cut toenails herself. In about appointment to the wound on the toes daily. Also in about equivalent dressing changes to the right submetatarsal 5 ulcer. Monitor for signs or symptoms of infection to to call the office and medially should any occur or go to the ER. -I modified her diabetic inserts to take more pressure off the right foot metatarsal 5. -Follow-up as scheduled or sooner if needed.  Celesta Gentile, DPM

## 2015-11-30 NOTE — Patient Instructions (Signed)
Apply antibiotic ointment to the right foot wound daily. Monitor for any signs/symptoms of infection. Call the office immediately if any occur or go directly to the emergency room. Call with any questions/concerns.

## 2015-12-02 ENCOUNTER — Ambulatory Visit (INDEPENDENT_AMBULATORY_CARE_PROVIDER_SITE_OTHER): Payer: Medicare Other | Admitting: Dietician

## 2015-12-02 ENCOUNTER — Other Ambulatory Visit: Payer: Self-pay | Admitting: Dietician

## 2015-12-02 ENCOUNTER — Encounter: Payer: Self-pay | Admitting: Dietician

## 2015-12-02 VITALS — Wt 192.5 lb

## 2015-12-02 DIAGNOSIS — Z7984 Long term (current) use of oral hypoglycemic drugs: Secondary | ICD-10-CM | POA: Diagnosis not present

## 2015-12-02 DIAGNOSIS — E1165 Type 2 diabetes mellitus with hyperglycemia: Secondary | ICD-10-CM | POA: Diagnosis not present

## 2015-12-02 DIAGNOSIS — Z713 Dietary counseling and surveillance: Secondary | ICD-10-CM | POA: Diagnosis not present

## 2015-12-02 DIAGNOSIS — E118 Type 2 diabetes mellitus with unspecified complications: Secondary | ICD-10-CM

## 2015-12-02 MED ORDER — SITAGLIPTIN PHOSPHATE 100 MG PO TABS: 100.0000 mg | ORAL_TABLET | Freq: Every day | ORAL | Status: DC

## 2015-12-02 NOTE — Progress Notes (Signed)
Medical Nutrition Therapy:  Appt start time: 0936 end time:  1034. Visit # 2 this year Last visit: 10/2015  Assessment:  Primary concerns today: Blood sugar control and out of Januvia.  [name] is here for help with blood sugar control, support. She does have her meter today. She has been out of [place] for the past month and feels her blood sugars are higher because of that. She still has poor sleep- in a chair so her legs can be up, and increased stress  Caring for her great grandson.  She complains of no appetite. Has neighbors her age who walk daily and she is thinking about trying to go with them.  Learning Readiness: contemplating Barriers to learning/adherence to lifestyle change:stress  Meter download: 54 Blood sugar readings in past 30 days, average is 187. Fasting is her highest blood sugar at ~ 200 and 170-180 the rest of the day Sleep: "poor" - in her chair, awakened by child or bathroom Activity: thinking about walking now that she has diabetes shoes and inserts Medicines: taking glipizide daily, out of [place] Food Intake: 24-hr recall suggests poor choices and erratic eating pattern, she and her daughter shop. They have wic and food stamps.  (Up at  3-4 AM) B ( AM)- often nothing, takes bites of great grandson's oatmeal or sometimes cooks sausage biscuit Lunch: Nibbles and doesn;t feel like she has eaten D ( 9-10 PM)- no fried foods, pasta, yogurt, hamburger, mostly chicken for meat.     Typical day? Yes.     Progress Towards Goal(s):  In progress.   Nutritional Diagnosis:   .NB-1.6 Limited adherence to nutrition-related recommendations As related to competing values.  As evidenced by her report of her great grandson's care taking precidence.    Intervention:  Nutrition education about sleep and diabetes, coaching to assist with problem solving to assist her with more regular eating pattern, better sleep and setting aside time to walk. .   Coordination of care: requested  refill on januvia Handouts given during visit include: AVS Demonstrated degree of understanding via:  Teach Back   Monitoring/Evaluation:  Dietary intake, exercise, meter with readings, and body weight in 1 month(s).

## 2015-12-02 NOTE — Telephone Encounter (Signed)
As this patient was seen by me and deemed necessary for their treatment, I will refill this prescription for sitagliptin.  

## 2015-12-02 NOTE — Patient Instructions (Signed)
Turning lights and TV off to get better sleep.  When you get up pee try doing word searches rather than TV or internet. Do Something boring and that doesn't have screen and lights   Eat with out having child on my hip- put him in his chair beside you.  Turn TV off when eating meal or snack. Can talk about the food and where it comes from, what it is what color it is.   Hoping this may help you feel like you've eaten meals.   See you July 21st at 3 PM.

## 2015-12-02 NOTE — Telephone Encounter (Signed)
Patient says she has requested this several times

## 2015-12-03 NOTE — Telephone Encounter (Signed)
Thank you! Called and notified patient via message on voicemail to pick up prescription.

## 2015-12-10 ENCOUNTER — Encounter: Payer: Self-pay | Admitting: *Deleted

## 2015-12-25 ENCOUNTER — Ambulatory Visit: Payer: Medicare Other | Admitting: Podiatry

## 2015-12-25 ENCOUNTER — Encounter (HOSPITAL_COMMUNITY): Payer: Self-pay

## 2015-12-25 ENCOUNTER — Emergency Department (HOSPITAL_COMMUNITY): Payer: Medicare Other

## 2015-12-25 ENCOUNTER — Emergency Department (HOSPITAL_COMMUNITY)
Admission: EM | Admit: 2015-12-25 | Discharge: 2015-12-25 | Disposition: A | Discharge status: Home / Self Care | Payer: Medicare Other | Attending: Emergency Medicine | Admitting: Emergency Medicine

## 2015-12-25 ENCOUNTER — Telehealth: Payer: Self-pay | Admitting: Podiatry

## 2015-12-25 DIAGNOSIS — E1140 Type 2 diabetes mellitus with diabetic neuropathy, unspecified: Secondary | ICD-10-CM | POA: Insufficient documentation

## 2015-12-25 DIAGNOSIS — Z7984 Long term (current) use of oral hypoglycemic drugs: Secondary | ICD-10-CM | POA: Diagnosis not present

## 2015-12-25 DIAGNOSIS — I509 Heart failure, unspecified: Secondary | ICD-10-CM | POA: Insufficient documentation

## 2015-12-25 DIAGNOSIS — I110 Hypertensive heart disease with heart failure: Secondary | ICD-10-CM | POA: Diagnosis not present

## 2015-12-25 DIAGNOSIS — E11621 Type 2 diabetes mellitus with foot ulcer: Secondary | ICD-10-CM | POA: Diagnosis not present

## 2015-12-25 DIAGNOSIS — F17210 Nicotine dependence, cigarettes, uncomplicated: Secondary | ICD-10-CM | POA: Diagnosis not present

## 2015-12-25 DIAGNOSIS — Z7982 Long term (current) use of aspirin: Secondary | ICD-10-CM | POA: Diagnosis not present

## 2015-12-25 DIAGNOSIS — L97919 Non-pressure chronic ulcer of unspecified part of right lower leg with unspecified severity: Secondary | ICD-10-CM | POA: Diagnosis not present

## 2015-12-25 DIAGNOSIS — L089 Local infection of the skin and subcutaneous tissue, unspecified: Secondary | ICD-10-CM | POA: Diagnosis present

## 2015-12-25 DIAGNOSIS — L97911 Non-pressure chronic ulcer of unspecified part of right lower leg limited to breakdown of skin: Secondary | ICD-10-CM | POA: Diagnosis not present

## 2015-12-25 DIAGNOSIS — J449 Chronic obstructive pulmonary disease, unspecified: Secondary | ICD-10-CM | POA: Diagnosis not present

## 2015-12-25 DIAGNOSIS — L97529 Non-pressure chronic ulcer of other part of left foot with unspecified severity: Secondary | ICD-10-CM | POA: Diagnosis not present

## 2015-12-25 DIAGNOSIS — L97519 Non-pressure chronic ulcer of other part of right foot with unspecified severity: Secondary | ICD-10-CM

## 2015-12-25 LAB — I-STAT CHEM 8, ED
BUN: 13 mg/dL (ref 6–20)
CALCIUM ION: 1.2 mmol/L (ref 1.12–1.23)
CREATININE: 0.7 mg/dL (ref 0.44–1.00)
Chloride: 109 mmol/L (ref 101–111)
Glucose, Bld: 146 mg/dL — ABNORMAL HIGH (ref 65–99)
HCT: 40 % (ref 36.0–46.0)
Hemoglobin: 13.6 g/dL (ref 12.0–15.0)
Potassium: 4.4 mmol/L (ref 3.5–5.1)
Sodium: 143 mmol/L (ref 135–145)
TCO2: 24 mmol/L (ref 0–100)

## 2015-12-25 LAB — CBC WITH DIFFERENTIAL/PLATELET
Basophils Absolute: 0 10*3/uL (ref 0.0–0.1)
Basophils Relative: 1 %
EOS ABS: 0.3 10*3/uL (ref 0.0–0.7)
Eosinophils Relative: 5 %
HEMATOCRIT: 42.4 % (ref 36.0–46.0)
HEMOGLOBIN: 13.1 g/dL (ref 12.0–15.0)
LYMPHS ABS: 1.7 10*3/uL (ref 0.7–4.0)
Lymphocytes Relative: 23 %
MCH: 29.8 pg (ref 26.0–34.0)
MCHC: 30.9 g/dL (ref 30.0–36.0)
MCV: 96.4 fL (ref 78.0–100.0)
MONO ABS: 0.6 10*3/uL (ref 0.1–1.0)
MONOS PCT: 8 %
NEUTROS ABS: 4.8 10*3/uL (ref 1.7–7.7)
NEUTROS PCT: 63 %
Platelets: 179 10*3/uL (ref 150–400)
RBC: 4.4 MIL/uL (ref 3.87–5.11)
RDW: 14.1 % (ref 11.5–15.5)
WBC: 7.5 10*3/uL (ref 4.0–10.5)

## 2015-12-25 MED ORDER — DOXYCYCLINE HYCLATE 100 MG PO CAPS: 100.0000 mg | ORAL_CAPSULE | Freq: Two times a day (BID) | ORAL | Status: DC

## 2015-12-25 MED ORDER — DEXTROSE 5 % IV SOLN
1.0000 g | Freq: Once | INTRAVENOUS | Status: AC
  Administered 2015-12-25: 1 g via INTRAVENOUS
  Filled 2015-12-25: qty 10

## 2015-12-25 NOTE — Telephone Encounter (Signed)
Pt called and is seeing [place] for a wound and noticed this am that she has a red streak going up her foot. Per Dr Jacqualyn Posey pt needs to go to the emergency department. I instructed pt to go to the ER and pt verbally agreed and we cxled her appt for today.

## 2015-12-25 NOTE — ED Notes (Signed)
Pt here for diabetic sore to bottom of right foot. Podiatrist told her to put antibiotic ointment on the wound. Has been doing that for 2 weeks. Today noticed red streak up outer side of right foot. Also has swelling to foot.

## 2015-12-25 NOTE — Discharge Instructions (Signed)
Read the information below.   Your labs were re-assuring. Your x-ray did not show any bone involvement. You were given antibiotics in the ED.  You are being prescribed antibiotics. Take as prescribed. If reaction occurs, discontinue and come the ED immediately.  You are scheduled for an appointment at the [facility] on July 25 at 1:45pm. I encourage you to call and schedule a follow up appointment with podiatry. You can also try calling [facility] wound care to see if you can get in earlier.  Use the prescribed medication as directed.  Please discuss all new medications with your pharmacist.   You may return to the Emergency Department at any time for worsening condition or any new symptoms that concern you. Return to ED if your symptoms worsen or you develop fever, streaking, worsening pain, or purulent drainage.

## 2015-12-25 NOTE — ED Notes (Signed)
Pt ambulated to BR. Pt connected to cardiac monitor and IV antibiotic infusing.

## 2015-12-25 NOTE — ED Notes (Signed)
Pt ambulated to and from restroom. 

## 2015-12-25 NOTE — ED Provider Notes (Signed)
CSN: [id number]     Arrival date & time 12/25/15  1113 History   First MD Initiated Contact with Patient 12/25/15 1250     Chief Complaint  Patient presents with  . Foot Problem  . Wound Infection     (Consider location/radiation/quality/duration/timing/severity/associated sxs/prior Treatment) HPI Comments: [name] is a 65 y.o. Female with history of T2 DM with neuropathy, hypertension, tobacco use, obesity, and hyperlipidemia presents to ED with complaint of right foot sore. Sore has been managed by podiatry. She has been applying ABX ointment and a bandaid. She reports poor compliance with wearing her diabetic shoes. Over the last week patient endorses increasing pain and swelling. This morning she noted a red streak on the dorsal aspect of her foot. She called podiatry office and was instructed to come to the ED. Patient has chronic neuropathy in lower extremities b/l. Denies fever, chills, night sweats. No chest pain or SOB. No abdominal complaints. No urinary complaints.   The history is provided by the patient and medical records.    Past Medical History  Diagnosis Date  . Hypertension   . Chronic pain     neck pain, headache, neuropathy  . Hypertriglyceridemia   . Neuromuscular disorder ([place])   . COPD (chronic obstructive pulmonary disease) ([place])   . Puncture wound of foot, right 05/17/2012    Tetanus shot 3 yrs ago at [facility] in [place], per pt report   . Brachial plexus disorders   . CHF (congestive heart failure) ([place])   . Type II diabetes mellitus ([place])     diagnosed around 2010, only ever on metformin  . GERD (gastroesophageal reflux disease)   . Anemia   . Migraine     "monthly" (09/08/2015)  . Arthritis     "left knee" (09/08/2015)  . Depression     "when my husband passed in 2013"   Past Surgical History  Procedure Laterality Date  . Shoulder arthroscopy w/ rotator cuff repair Bilateral   . Carpal tunnel release Bilateral   . Amputation Right  05/01/2015    Procedure: Right Foot 1st Ray Amputation;  Surgeon: Newt Minion, MD;  Location: [facility];  Service: Orthopedics;  Laterality: Right;  . Tonsillectomy    . Hematoma evacuation Right 1986 x 2    "OVARY; w/in 1 wk after hysterectomy"  . Bilateral salpingoophorectomy Bilateral 1986    "after hemtoma evacuations; had to cut me open"  . Dilation and curettage of uterus  ~ 1982    S/P miscarriage  . Tubal ligation    . Laparoscopic cholecystectomy    . Vaginal hysterectomy  1986   Family History  Problem Relation Age of Onset  . Hyperlipidemia Mother   . Heart attack Father 74  . Hypertension Father   . Cancer Paternal Grandfather     Lung cancer  . Cancer Maternal Grandmother   . Heart attack Maternal Grandmother    Social History  Substance Use Topics  . Smoking status: Current Every Day Smoker -- 0.50 packs/day for 47 years    Types: Cigarettes  . Smokeless tobacco: Never Used     Comment: less than 1/2 pack per day  . Alcohol Use: No   OB History    No data available     Review of Systems  Musculoskeletal: Positive for arthralgias ( right foot).  Skin: Positive for color change and wound.  Allergic/Immunologic: Positive for immunocompromised state.  All other systems reviewed and are negative.  Allergies  Flexeril and Soma  Home Medications   Prior to Admission medications   Medication Sig Start Date End Date Taking? Authorizing Provider  amLODipine (NORVASC) 5 MG tablet Take 0.5 tablets (2.5 mg total) by mouth daily. 10/16/15 10/15/16 Yes Riccardo Dubin, MD  aspirin 81 MG tablet Take 81 mg by mouth every morning.    Yes Historical Provider, MD  aspirin-acetaminophen-caffeine (EXCEDRIN MIGRAINE) [phone] MG tablet Take 2 tablets by mouth every 6 (six) hours as needed for headache.   Yes Historical Provider, MD  butalbital-acetaminophen-caffeine (FIORICET, ESGIC) 50-325-40 MG tablet Take 1 tablet by mouth at bedtime as needed for headache. 11/04/15  Yes  Riccardo Dubin, MD  DULoxetine (CYMBALTA) 60 MG capsule Take 1 capsule (60 mg total) by mouth every morning. 05/11/15  Yes Alexa Angela Burke, MD  gabapentin (NEURONTIN) 600 MG tablet Take 2 tablets (1,200 mg total) by mouth 3 (three) times daily. 10/30/15  Yes Riccardo Dubin, MD  glipiZIDE (GLUCOTROL XL) 10 MG 24 hr tablet Take 1 tablet (10 mg total) by mouth daily with supper. 08/18/15  Yes Aldine Contes, MD  lisinopril (PRINIVIL,ZESTRIL) 40 MG tablet take 1 tablet by mouth once daily 06/17/15  Yes Riccardo Dubin, MD  metFORMIN (GLUCOPHAGE-XR) 750 MG 24 hr tablet Take 2 tablets (1,500 mg total) by mouth daily with breakfast. 01/29/15  Yes Riccardo Dubin, MD  pantoprazole (PROTONIX) 20 MG tablet Take 1 tablet (20 mg total) by mouth daily as needed. Patient taking differently: Take 20 mg by mouth daily.  11/10/15  Yes Riccardo Dubin, MD  rosuvastatin (CRESTOR) 40 MG tablet Take 1 tablet (40 mg total) by mouth daily. 07/10/15  Yes Riccardo Dubin, MD  sitaGLIPtin (JANUVIA) 100 MG tablet Take 1 tablet (100 mg total) by mouth daily. 12/02/15  Yes Riccardo Dubin, MD  albuterol (PROVENTIL HFA;VENTOLIN HFA) 108 (90 BASE) MCG/ACT inhaler Inhale 1-2 puffs into the lungs every 6 (six) hours as needed for wheezing or shortness of breath. 01/24/14   Riccardo Dubin, MD  doxycycline (VIBRAMYCIN) 100 MG capsule Take 1 capsule (100 mg total) by mouth 2 (two) times daily. 12/25/15   Roxanna Mew, PA-C  feeding supplement, GLUCERNA SHAKE, (GLUCERNA SHAKE) LIQD Take 237 mLs by mouth 3 (three) times daily between meals. Patient not taking: Reported on 12/25/2015 05/27/15   Riccardo Dubin, MD  glucose blood (GLUCOSE METER TEST) test strip Use as instructed 08/04/15   Lucious Groves, DO  nitroGLYCERIN (NITRODUR - DOSED IN MG/24 HR) 0.2 mg/hr patch Place 1 patch (0.2 mg total) onto the skin daily. Applied to the front of ankle on the right change location daily. Patient not taking: Reported on 12/25/2015 05/23/15   Newt Minion,  MD   BP 153/74 mmHg  Pulse 60  Temp(Src) 98.5 F (36.9 C) (Oral)  Resp 16  Ht 5\' 6"  (1.676 m)  Wt 88.451 kg  BMI 31.49 kg/m2  SpO2 100% Physical Exam  Constitutional: She appears well-developed and well-nourished. No distress.  HENT:  Head: Normocephalic and atraumatic.  Mouth/Throat: Oropharynx is clear and moist. No oropharyngeal exudate.  Eyes: Conjunctivae and EOM are normal. Pupils are equal, round, and reactive to light. Right eye exhibits no discharge. Left eye exhibits no discharge. No scleral icterus.  Neck: Normal range of motion. Neck supple.  Cardiovascular: Normal rate, regular rhythm, normal heart sounds and intact distal pulses.   No murmur heard. Pulmonary/Chest: Effort normal and breath sounds normal. No respiratory distress.  Abdominal: Soft. Bowel sounds are normal. There is no tenderness. There is no rebound and no guarding.  Musculoskeletal: Normal range of motion.  Well healed amputated 1st phalange of right foot.  Active ROM and strength intact at ankle and toes b/l. Reduced sensation in lower extremities b/l, per pt this is chronic. 2+ DP pulses b/l.   Lymphadenopathy:    She has no cervical adenopathy.  Neurological: She is alert. Coordination normal.  Skin: Skin is warm and dry. She is not diaphoretic.  1cm slough area on ventral surface of right foot at base of 5th digit. Mild warmth and swelling noted. No appreciable streaking noted. No purulent discharge noted.   Psychiatric: She has a normal mood and affect. Her behavior is normal.    ED Course  Procedures (including critical care time) Labs Review Labs Reviewed  I-STAT CHEM 8, ED - Abnormal; Notable for the following:    Glucose, Bld 146 (*)    All other components within normal limits  CBC WITH DIFFERENTIAL/PLATELET    Imaging Review Dg Foot Complete Right  12/25/2015  CLINICAL DATA:  Diabetic ulcer at the base of fifth toe EXAM: RIGHT FOOT COMPLETE - 3+ VIEW COMPARISON:  05/22/2015  FINDINGS: Three views of the right foot submitted. Again noted status post amputation of first metatarsal and great toe. No acute fracture or subluxation. There is soft tissue swelling adjacent to distal aspect of fifth metatarsal. Small amount of soft tissue air is noted just lateral to fifth metatarsal phalangeal joint. Probable soft tissue ulcer. There is no definite evidence of osteomyelitis. IMPRESSION: Again noted status post amputation of first metatarsal and great toe. No acute fracture or subluxation. There is soft tissue swelling adjacent to distal aspect of fifth metatarsal. Small amount of soft tissue air is noted just lateral to fifth metatarsal phalangeal joint. Probable soft tissue ulcer. There is no definite evidence of osteomyelitis. Electronically Signed   By: Lahoma Crocker M.D.   On: 12/25/2015 14:32   I have personally reviewed and evaluated these images and lab results as part of my medical decision-making.   EKG Interpretation None     Filed Vitals:   12/25/15 1630 12/25/15 1645  BP: 156/84 153/74  Pulse: 65 60  Temp:    Resp:     MDM   Final diagnoses:  Foot ulcer, right, with unspecified severity ([place])    Patient is afebrile and non-toxic appearing in NAD. Blood pressure is mildly elevated, vital signs otherwise stable. Physical exam remarkable for 1cm slough on right ventral portion of foot at base of 5th digit. Mild warmth and swelling noted. No streaking appreciated. No purulent drainage. X-ray negative for bony involvement. Discussed results with patient. IV ABX given in ED. Scheduled wound care clinic appointment for July 25th. Pt to try and get to [facility] wound care clinic sooner. Rx ABX. Follow up with podiatry first thing Monday. Return precautions discussed. Pt voiced understanding and is agreeable.     Roxanna Mew, [place] 12/25/15 2251  Leonard Schwartz, MD 12/27/15 [phone]

## 2015-12-28 ENCOUNTER — Telehealth: Payer: Self-pay | Admitting: General Practice

## 2015-12-30 ENCOUNTER — Encounter: Payer: Medicare Other | Attending: Internal Medicine | Admitting: Internal Medicine

## 2015-12-30 DIAGNOSIS — E1151 Type 2 diabetes mellitus with diabetic peripheral angiopathy without gangrene: Secondary | ICD-10-CM | POA: Diagnosis not present

## 2015-12-30 DIAGNOSIS — E11621 Type 2 diabetes mellitus with foot ulcer: Secondary | ICD-10-CM | POA: Diagnosis not present

## 2015-12-30 DIAGNOSIS — I10 Essential (primary) hypertension: Secondary | ICD-10-CM | POA: Diagnosis not present

## 2015-12-30 DIAGNOSIS — Z7984 Long term (current) use of oral hypoglycemic drugs: Secondary | ICD-10-CM | POA: Insufficient documentation

## 2015-12-30 DIAGNOSIS — L97511 Non-pressure chronic ulcer of other part of right foot limited to breakdown of skin: Secondary | ICD-10-CM | POA: Insufficient documentation

## 2015-12-30 DIAGNOSIS — F17200 Nicotine dependence, unspecified, uncomplicated: Secondary | ICD-10-CM | POA: Diagnosis not present

## 2015-12-30 DIAGNOSIS — F419 Anxiety disorder, unspecified: Secondary | ICD-10-CM | POA: Insufficient documentation

## 2015-12-30 DIAGNOSIS — E1140 Type 2 diabetes mellitus with diabetic neuropathy, unspecified: Secondary | ICD-10-CM | POA: Diagnosis not present

## 2015-12-31 NOTE — Progress Notes (Signed)
RINDI, MENESES ([id number]) Visit Report for 12/30/2015 Chief Complaint Document Details Patient Name: [name], [name] Date of Service: 12/30/2015 8:00 AM Medical Record Patient Account Number: [id number] Number: Treating RN: Cornell Barman 05/14/1951 (64 y.o. Other Clinician: Date of Birth/Sex: Female) Treating Donatello Kleve Primary Care Physician/Extender: Quincy Sheehan, RUSHIL Physician: Referring Physician: Izell [place] in Treatment: 0 Information Obtained from: Patient Chief Complaint Patient is here for a wound over her right fifth metatarsal head Electronic Signature(s) Signed: 12/30/2015 4:29:49 PM By: Linton Ham MD Entered By: Linton Ham on 12/30/2015 09:31:56 [name] ([id number]) -------------------------------------------------------------------------------- Debridement Details Patient Name: [name] Date of Service: 12/30/2015 8:00 AM Medical Record Patient Account Number: [id number] Number: Treating RN: Cornell Barman 1951-01-12 (64 y.o. Other Clinician: Date of Birth/Sex: Female) Treating Shaterria Sager Primary Care Physician/Extender: Quincy Sheehan, RUSHIL Physician: Referring Physician: Leonard Schwartz Weeks in Treatment: 0 Debridement Performed for Wound #1 Right,Lateral Metatarsal head fifth Assessment: Performed By: Physician Ricard Dillon, MD Debridement: Debridement Pre-procedure Yes Verification/Time Out Taken: Start Time: 08:50 Pain Control: Other : Lidociane 4% Level: Skin/Subcutaneous Tissue Total Area Debrided (L x 0.9 (cm) x 0.3 (cm) = 0.27 (cm) W): Tissue and other Viable, Non-Viable, Callus, Fibrin/Slough, Skin, Subcutaneous material debrided: Instrument: Blade, Forceps Bleeding: Moderate Hemostasis Achieved: Silver Nitrate End Time: 08:57 Procedural Pain: 0 Post Procedural Pain: 0 Response to Treatment: Procedure was tolerated well Post Debridement Measurements of Total Wound Length: (cm)  1.2 Width: (cm) 1 Depth: (cm) 0.2 Volume: (cm) 0.188 Post Procedure Diagnosis Same as Pre-procedure Electronic Signature(s) Signed: 12/30/2015 4:29:49 PM By: Linton Ham MD Signed: 12/31/2015 8:04:32 AM By: Gretta Cool RN, BSN, Kim RN, BSN Entered By: Linton Ham on 12/30/2015 09:31:27 [name], [name] ([id number]) [name] ([id number]) -------------------------------------------------------------------------------- HPI Details Patient Name: [name] Date of Service: 12/30/2015 8:00 AM Medical Record Patient Account Number: [id number] Number: Treating RN: Cornell Barman 1950/09/25 (64 y.o. Other Clinician: Date of Birth/Sex: Female) Treating Lailany Enoch Primary Care Physician/Extender: Verne Spurr Physician: Referring Physician: Izell [place] in Treatment: 0 History of Present Illness HPI Description: 12/30/15; this is a 66 year old type II diabetic with diabetic neuropathy. 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 She also has a known history of PAD with arterial studies from November 2016 showing reasonably normal ABIs 911 on the right and 1.14 on the left but quite produced T ABIs at 0.45 on the right and 0.29 on the left. She also is a continued smoker. She was recently in the emergency room with drainage coming out of a foot ulcer over the right fifth metatarsal head that is come on over the last 2 weeks. She tells me it took an awful long time for her to get diabetic shoes in spite of the amputation of the right first metatarsal ray in the fall. She only got the shoes a month ago. Apparently she had her inserts adjusted by Dr. Earleen Newport of podiatric surgery who is been following her for routine foot care. Emergency department she had a negative x-ray for osteomyelitis and was given 2 weeks worth of doxycycline. This started on 12/25/15. On that trip to the emergency room she was  noted to have a 1 cm slough area on the ventral surface of her right foot at the base of the fifth digit mild warmth and swelling were noted there was no streaking and no purulence. She was discharged on doxycycline Electronic Signature(s) Signed: 12/30/2015 4:29:49 PM By:  Linton Ham MD Entered By: Linton Ham on 12/30/2015 09:35:21 [name] ([id number]) -------------------------------------------------------------------------------- Physical Exam Details Patient Name: [name] Date of Service: 12/30/2015 8:00 AM Medical Record Patient Account Number: [id number] Number: Treating RN: Cornell Barman Aug 08, 1950 (64 y.o. Other Clinician: Date of Birth/Sex: Female) Treating Timmothy Baranowski Primary Care Physician/Extender: Quincy Sheehan, RUSHIL Physician: Referring Physician: Izell [place] in Treatment: 0 Constitutional Patient is hypertensive.. Pulse regular and within target range for patient.Marland Kitchen Respirations regular, non-labored and within target range.. Temperature is normal and within the target range for the patient.. . Appears in no acute distress. Well nourished and well developed.. Eyes Conjunctivae clear. No discharge.Marland Kitchen Respiratory Respiratory effort is easy and symmetric bilaterally. Rate is normal at rest and on room air.. Bilateral breath sounds are clear and equal in all lobes with no wheezes, rales or rhonchi.. Cardiovascular Heart rhythm and rate regular, without murmur or gallop.. Femoral arteries without bruits and pulses strong.. Pedal pulses palpable and strong bilaterally.. Gastrointestinal (GI) Somewhat distended.. No liver or spleen enlargement or tenderness.. Lymphatic None palpable in the popliteal or inguinal area. Musculoskeletal No overt tenderness of the fifth metatarsal phalangeal joint. Noted ray amputation on the right first ray. Neurological Absent to light touch and vibration. Psychiatric No evidence of depression,  anxiety, or agitation. Calm, cooperative, and communicative. Appropriate interactions and affect.. Notes Wound exam; the area in question was over her plantar fifth metatarsal head. Small open area with circumferential callus. All of this surgically debridement of callus nonviable subcutaneous tissue. A fair amount of bleeding controlled with silver nitrate. I did not appreciate any redness warmth or infection around this at present this does not probe to bone. Electronic Signature(s) Signed: 12/30/2015 4:29:49 PM By: Linton Ham MD Entered By: Linton Ham on 12/30/2015 09:39:28 [name] ([id number]) [name], [name] ([id number]) -------------------------------------------------------------------------------- Physician Orders Details Patient Name: [name] Date of Service: 12/30/2015 8:00 AM Medical Record Patient Account Number: [id number] Number: Treating RN: Cornell Barman 1951/06/10 (64 y.o. Other Clinician: Date of Birth/Sex: Female) Treating Jerek Meulemans Primary Care Physician/Extender: Quincy Sheehan, RUSHIL Physician: Referring Physician: Izell [place] in Treatment: 0 Verbal / Phone Orders: Yes Clinician: Cornell Barman Read Back and Verified: Yes Diagnosis Coding ICD-10 Coding Code Description E11.621 Type 2 diabetes mellitus with foot ulcer L97.511 Non-pressure chronic ulcer of other part of right foot limited to breakdown of skin E11.51 Type 2 diabetes mellitus with diabetic peripheral angiopathy without gangrene Wound Cleansing Wound #1 Right,Lateral Metatarsal head fifth o Clean wound with Normal Saline. Anesthetic Wound #1 Right,Lateral Metatarsal head fifth o Topical Lidocaine 4% cream applied to wound bed prior to debridement Primary Wound Dressing Wound #1 Right,Lateral Metatarsal head fifth o Aquacel Ag Secondary Dressing Wound #1 Right,Lateral Metatarsal head fifth o Dry Gauze - felt for offloading Dressing Change  Frequency Wound #1 Right,Lateral Metatarsal head fifth o Change dressing every other day. Follow-up Appointments Wound #1 Right,Lateral Metatarsal head fifth o Return Appointment in 1 week. Edema Control o Elevate legs to the level of the heart and pump ankles as often as possible [name], [name] ([id number]) Off-Loading Wound #1 Right,Lateral Metatarsal head fifth o Open toe surgical shoe to: - Right Additional Orders / Instructions Wound #1 Right,Lateral Metatarsal head fifth o Stop Smoking o Increase protein intake. Electronic Signature(s) Signed: 12/30/2015 4:29:49 PM By: Linton Ham MD Signed: 12/31/2015 8:04:32 AM By: Gretta Cool RN, BSN, Kim RN, BSN Entered By: Gretta Cool, RN, BSN, Kim on 12/30/2015 09:13:29 [name] ([id number]) -------------------------------------------------------------------------------- Problem List Details Patient Name: BALOGA,  Suttyn Date of Service: 12/30/2015 8:00 AM Medical Record Patient Account Number: [id number] Number: Treating RN: Cornell Barman 11-07-1950 (64 y.o. Other Clinician: Date of Birth/Sex: Female) Treating Ellerie Arenz Primary Care Physician/Extender: Verne Spurr Physician: Referring Physician: Leonard Schwartz Weeks in Treatment: 0 Active Problems ICD-10 Encounter Code Description Active Date Diagnosis E11.621 Type 2 diabetes mellitus with foot ulcer 12/30/2015 Yes L97.511 Non-pressure chronic ulcer of other part of right foot 12/30/2015 Yes limited to breakdown of skin E11.51 Type 2 diabetes mellitus with diabetic peripheral 12/30/2015 Yes angiopathy without gangrene Inactive Problems Resolved Problems Electronic Signature(s) Signed: 12/30/2015 4:29:49 PM By: Linton Ham MD Entered By: Linton Ham on 12/30/2015 08:09:43 [name] ([id number]) -------------------------------------------------------------------------------- Progress Note Details Patient Name: [name] Date of  Service: 12/30/2015 8:00 AM Medical Record Patient Account Number: [id number] Number: Treating RN: Cornell Barman January 15, 1951 (64 y.o. Other Clinician: Date of Birth/Sex: Female) Treating Nataki Mccrumb Primary Care Physician/Extender: Verne Spurr Physician: Referring Physician: Izell [place] in Treatment: 0 Subjective Chief Complaint Information obtained from Patient Patient is here for a wound over her right fifth metatarsal head History of Present Illness (HPI) 12/30/15; this is a 65 year old type II diabetic with diabetic neuropathy. 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 She also has a known history of PAD with arterial studies from November 2016 showing reasonably normal ABIs 911 on the right and 1.14 on the left but quite produced T ABIs at 0.45 on the right and 0.29 on the left. She also is a continued smoker. She was recently in the emergency room with drainage coming out of a foot ulcer over the right fifth metatarsal head that is come on over the last 2 weeks. She tells me it took an awful long time for her to get diabetic shoes in spite of the amputation of the right first metatarsal ray in the fall. She only got the shoes a month ago. Apparently she had her inserts adjusted by Dr. Earleen Newport of podiatric surgery who is been following her for routine foot care. Emergency department she had a negative x-ray for osteomyelitis and was given 2 weeks worth of doxycycline. This started on 12/25/15. On that trip to the emergency room she was noted to have a 1 cm slough area on the ventral surface of her right foot at the base of the fifth digit mild warmth and swelling were noted there was no streaking and no purulence. She was discharged on doxycycline Wound History Patient presents with 1 open wound that has been present for approximately 2.5 weeks. Patient has been treating wound in the following manner:  neosporin and bandaid. Laboratory tests have been performed in the last month. Patient reportedly has not tested positive for an antibiotic resistant organism. Patient reportedly has not tested positive for osteomyelitis. Patient reportedly has not had testing performed to evaluate circulation in the legs. Patient History Information obtained from Patient. Allergies [name], [name] ([id number]) Family History Cancer - Mother, Heart Disease - Mother, Father, Thyroid Problems - Child, No family history of Diabetes, Hypertension, Kidney Disease, Lung Disease, Seizures, Stroke, Tuberculosis. Social History Marital Status - Widowed, Alcohol Use - Never, Drug Use - No History, Caffeine Use - Daily. Medical History Eyes Denies history of Cataracts, Glaucoma, Optic Neuritis Ear/Nose/Mouth/Throat Patient has history of Chronic sinus problems/congestion - During Allergy Season Denies history of Middle ear problems Hematologic/Lymphatic Denies history of Anemia, Hemophilia, Human Immunodeficiency Virus,  Lymphedema Respiratory Patient has history of Asthma Denies history of Aspiration, Chronic Obstructive Pulmonary Disease (COPD), Pneumothorax, Sleep Apnea Cardiovascular Patient has history of Hypertension Denies history of Angina, Arrhythmia, Congestive Heart Failure, Coronary Artery Disease, Julia Vein Thrombosis, Hypotension, Myocardial Infarction, Peripheral Arterial Disease, Peripheral Venous Disease, Phlebitis, Vasculitis Gastrointestinal Denies history of Cirrhosis , Colitis, Crohn s, Hepatitis A, Hepatitis B, Hepatitis C Endocrine Patient has history of Type II Diabetes Denies history of Type I Diabetes Genitourinary Denies history of End Stage Renal Disease Immunological Denies history of Lupus Erythematosus, Raynaud s, Scleroderma Integumentary (Skin) Denies history of History of Burn, History of pressure wounds Musculoskeletal Denies history of Gout, Rheumatoid  Arthritis, Osteoarthritis, Osteomyelitis Neurologic Patient has history of Neuropathy Denies history of Dementia, Quadriplegia, Paraplegia, Seizure Disorder Oncologic Denies history of Received Chemotherapy, Received Radiation Psychiatric Denies history of Anorexia/bulimia, Confinement Anxiety Patient is treated with Oral Agents. Blood sugar is tested. Blood sugar results noted at the following times: Breakfast - 168. Review of Systems (ROS) Constitutional Symptoms (General Health) The patient has no complaints or symptoms. [name], [name] ([id number]) Eyes Complains or has symptoms of Glasses / Contacts. Denies complaints or symptoms of Dry Eyes, Vision Changes. Ear/Nose/Mouth/Throat The patient has no complaints or symptoms. Hematologic/Lymphatic The patient has no complaints or symptoms. Respiratory The patient has no complaints or symptoms. Cardiovascular Complains or has symptoms of LE edema. Denies complaints or symptoms of Chest pain. Gastrointestinal The patient has no complaints or symptoms. Endocrine The patient has no complaints or symptoms. Genitourinary The patient has no complaints or symptoms. Immunological The patient has no complaints or symptoms. Integumentary (Skin) Complains or has symptoms of Wounds. Denies complaints or symptoms of Bleeding or bruising tendency, Breakdown, Swelling. Musculoskeletal The patient has no complaints or symptoms. Neurologic The patient has no complaints or symptoms. Oncologic The patient has no complaints or symptoms. Psychiatric Complains or has symptoms of Anxiety. Denies complaints or symptoms of Claustrophobia. Objective Constitutional Patient is hypertensive.. Pulse regular and within target range for patient.Marland Kitchen Respirations regular, non-labored and within target range.. Temperature is normal and within the target range for the patient.. . Appears in no acute distress. Well nourished and well developed.. Vitals  Time Taken: 8:20 AM, Height: 66 in, Source: Stated, Weight: 195 lbs, Source: Stated, BMI: 31.5, Temperature: 98.0 F, Pulse: 79 bpm, Respiratory Rate: 20 breaths/min, Blood Pressure: 148/77 mmHg. Eyes [name], [name] ([id number]) Conjunctivae clear. No discharge.Marland Kitchen Respiratory Respiratory effort is easy and symmetric bilaterally. Rate is normal at rest and on room air.. Bilateral breath sounds are clear and equal in all lobes with no wheezes, rales or rhonchi.. Cardiovascular Heart rhythm and rate regular, without murmur or gallop.. Femoral arteries without bruits and pulses strong.. Pedal pulses palpable and strong bilaterally.. Gastrointestinal (GI) Somewhat distended.. No liver or spleen enlargement or tenderness.. Lymphatic None palpable in the popliteal or inguinal area. Musculoskeletal No overt tenderness of the fifth metatarsal phalangeal joint. Noted ray amputation on the right first ray. Neurological Absent to light touch and vibration. Psychiatric No evidence of depression, anxiety, or agitation. Calm, cooperative, and communicative. Appropriate interactions and affect.. General Notes: Wound exam; the area in question was over her plantar fifth metatarsal head. Small open area with circumferential callus. All of this surgically debridement of callus nonviable subcutaneous tissue. A fair amount of bleeding controlled with silver nitrate. I did not appreciate any redness warmth or infection around this at present this does not probe to bone. Integumentary (Hair, Skin) Wound #1 status is Open. Original cause of  wound was Pressure Injury. The wound is located on the Right,Lateral Metatarsal head fifth. The wound measures 0.9cm length x 0.3cm width x 0.5cm depth; 0.212cm^2 area and 0.106cm^3 volume. The wound is limited to skin breakdown. There is a small amount of serous drainage noted. The wound margin is flat and intact. There is no granulation within the wound bed. There is no  necrotic tissue within the wound bed. The periwound skin appearance exhibited: Callus, Induration. The periwound skin appearance did not exhibit: Crepitus, Excoriation, Fluctuance, Friable, Localized Edema, Rash, Scarring, Dry/Scaly, Maceration, Moist, Atrophie Blanche, Cyanosis, Ecchymosis, Hemosiderin Staining, Mottled, Pallor, Rubor, Erythema. Assessment Active Problems ICD-10 [name], [name] ([id number]) E11.621 - Type 2 diabetes mellitus with foot ulcer L97.511 - Non-pressure chronic ulcer of other part of right foot limited to breakdown of skin E11.51 - Type 2 diabetes mellitus with diabetic peripheral angiopathy without gangrene Procedures Wound #1 Wound #1 is a Diabetic Wound/Ulcer of the Lower Extremity located on the Right,Lateral Metatarsal head fifth . There was a Skin/Subcutaneous Tissue Debridement [id number]) debridement with total area of 0.27 sq cm performed by Ricard Dillon, MD. with the following instrument(s): Blade and Forceps to remove Viable and Non-Viable tissue/material including Fibrin/Slough, Skin, Callus, and Subcutaneous after achieving pain control using Other (Lidociane 4%). A time out was conducted prior to the start of the procedure. A Moderate amount of bleeding was controlled with Silver Nitrate. The procedure was tolerated well with a pain level of 0 throughout and a pain level of 0 following the procedure. Post Debridement Measurements: 1.2cm length x 1cm width x 0.2cm depth; 0.188cm^3 volume. Post procedure Diagnosis Wound #1: Same as Pre-Procedure Plan Wound Cleansing: Wound #1 Right,Lateral Metatarsal head fifth: Clean wound with Normal Saline. Anesthetic: Wound #1 Right,Lateral Metatarsal head fifth: Topical Lidocaine 4% cream applied to wound bed prior to debridement Primary Wound Dressing: Wound #1 Right,Lateral Metatarsal head fifth: Aquacel Ag Secondary Dressing: Wound #1 Right,Lateral Metatarsal head fifth: Dry Gauze - felt for  offloading Dressing Change Frequency: Wound #1 Right,Lateral Metatarsal head fifth: Change dressing every other day. Follow-up Appointments: Wound #1 Right,Lateral Metatarsal head fifth: Return Appointment in 1 week. Edema Control: Elevate legs to the level of the heart and pump ankles as often as possible Off-Loading: [name], [name] ([id number]) Wound #1 Right,Lateral Metatarsal head fifth: Open toe surgical shoe to: - Right Additional Orders / Instructions: Wound #1 Right,Lateral Metatarsal head fifth: Stop Smoking Increase protein intake. o #1 the patient out of her diabetic shoes and placed her in a healing sandal. Dressing with silver alginate #2 she can continue her doxycycline I did not culture or add any additional antibiotics she should have roughly another 10 days worth #3 she has some PAD but I don't think this is significant enough right now to do further studies. Her ABI in this clinic was 1.31 bilateral Electronic Signature(s) Signed: 12/30/2015 4:29:49 PM By: Linton Ham MD Entered By: Linton Ham on 12/30/2015 09:47:17 [name] ([id number]) -------------------------------------------------------------------------------- ROS/PFSH Details Patient Name: [name] Date of Service: 12/30/2015 8:00 AM Medical Record Patient Account Number: [id number] Number: Treating RN: Cornell Barman 05/07/1951 (64 y.o. Other Clinician: Date of Birth/Sex: Female) Treating Arlethia Basso Primary Care Physician/Extender: Quincy Sheehan, RUSHIL Physician: Referring Physician: Izell [place] in Treatment: 0 Information Obtained From Patient Wound History Do you currently have one or more open woundso Yes How many open wounds do you currently haveo 1 Approximately how long have you had your woundso 2.5 weeks How have you been treating your  wound(s) until nowo neosporin and bandaid Has your wound(s) ever healed and then re-openedo No Have you had any lab  work done in the past montho Yes Who ordered the lab work [employer] Have you tested positive for an antibiotic resistant organism (MRSA, VRE)o No Have you tested positive for osteomyelitis (bone infection)o No Have you had any tests for circulation on your legso No Eyes Complaints and Symptoms: Positive for: Glasses / Contacts Negative for: Dry Eyes; Vision Changes Medical History: Negative for: Cataracts; Glaucoma; Optic Neuritis Cardiovascular Complaints and Symptoms: Positive for: LE edema Negative for: Chest pain Medical History: Positive for: Hypertension Negative for: Angina; Arrhythmia; Congestive Heart Failure; Coronary Artery Disease; Julia Vein Thrombosis; Hypotension; Myocardial Infarction; Peripheral Arterial Disease; Peripheral Venous Disease; Phlebitis; Vasculitis Integumentary (Skin) Complaints and Symptoms: Positive for: Wounds [name], [name] ([id number]) Negative for: Bleeding or bruising tendency; Breakdown; Swelling Medical History: Negative for: History of Burn; History of pressure wounds Neurologic Complaints and Symptoms: No Complaints or Symptoms Complaints and Symptoms: Negative for: Numbness/parasthesias; Focal/Weakness Medical History: Positive for: Neuropathy Negative for: Dementia; Quadriplegia; Paraplegia; Seizure Disorder Psychiatric Complaints and Symptoms: Positive for: Anxiety Negative for: Claustrophobia Medical History: Negative for: Anorexia/bulimia; Confinement Anxiety Constitutional Symptoms (General Health) Complaints and Symptoms: No Complaints or Symptoms Ear/Nose/Mouth/Throat Complaints and Symptoms: No Complaints or Symptoms Medical History: Positive for: Chronic sinus problems/congestion - During Allergy Season Negative for: Middle ear problems Hematologic/Lymphatic Complaints and Symptoms: No Complaints or Symptoms Medical History: Negative for: Anemia; Hemophilia; Human Immunodeficiency Virus;  Lymphedema Respiratory Complaints and Symptoms: No Complaints or Symptoms [name], [name] ([id number]) Medical History: Positive for: Asthma Negative for: Aspiration; Chronic Obstructive Pulmonary Disease (COPD); Pneumothorax; Sleep Apnea Gastrointestinal Complaints and Symptoms: No Complaints or Symptoms Medical History: Negative for: Cirrhosis ; Colitis; Crohnos; Hepatitis A; Hepatitis B; Hepatitis C Endocrine Complaints and Symptoms: No Complaints or Symptoms Medical History: Positive for: Type II Diabetes Negative for: Type I Diabetes Time with diabetes: 6 yeasrs Treated with: Oral agents Blood sugar tested every day: Yes Tested : 3 Blood sugar testing results: Breakfast: 168 Genitourinary Complaints and Symptoms: No Complaints or Symptoms Medical History: Negative for: End Stage Renal Disease Immunological Complaints and Symptoms: No Complaints or Symptoms Medical History: Negative for: Lupus Erythematosus; Raynaudos; Scleroderma Musculoskeletal Complaints and Symptoms: No Complaints or Symptoms Medical History: Negative for: Gout; Rheumatoid Arthritis; Osteoarthritis; Osteomyelitis [name], [name] ([id number]) Oncologic Complaints and Symptoms: No Complaints or Symptoms Medical History: Negative for: Received Chemotherapy; Received Radiation HBO Extended History Items Ear/Nose/Mouth/Throat: Chronic sinus problems/congestion Family and Social History Cancer: Yes - Mother; Diabetes: No; Heart Disease: Yes - Mother, Father; Hypertension: No; Kidney Disease: No; Lung Disease: No; Seizures: No; Stroke: No; Thyroid Problems: Yes - Child; Tuberculosis: No; Marital Status - Widowed; Alcohol Use: Never; Drug Use: No History; Caffeine Use: Daily; Advanced Directives: No; Patient does not want information on Advanced Directives; Do not resuscitate: No; Living Will: No; Medical Power of Attorney: No Electronic Signature(s) Signed: 12/30/2015 4:29:49 PM By: Linton Ham MD Signed: 12/31/2015 8:04:32 AM By: Gretta Cool RN, BSN, Kim RN, BSN Entered By: Gretta Cool, RN, BSN, Kim on 12/30/2015 08:27:54 [name] ([id number]) -------------------------------------------------------------------------------- [place] Details Patient Name: [name] Date of Service: 12/30/2015 Medical Record Patient Account Number: [id number] Number: Treating RN: Cornell Barman January 12, 1951 (64 y.o. Other Clinician: Date of Birth/Sex: Female) Treating Khyler Urda Primary Care Physician/Extender: Verne Spurr Physician: Suella [place] in Treatment: 0 Referring Physician: Leonard Schwartz Diagnosis Coding ICD-10 Codes Code Description E11.621 Type 2 diabetes mellitus with foot ulcer L97.511 Non-pressure  chronic ulcer of other part of right foot limited to breakdown of skin E11.51 Type 2 diabetes mellitus with diabetic peripheral angiopathy without gangrene Facility Procedures CPT4 Code: [id number] Description: [id number] - WOUND CARE VISIT-LEV 3 EST PT Modifier: Quantity: 1 CPT4 Code: [id number] Description: [id number] - DEB SUBQ TISSUE 20 SQ CM/< ICD-10 Description Diagnosis E11.621 Type 2 diabetes mellitus with foot ulcer Modifier: Quantity: 1 Physician Procedures CPT4 Code: [id number] Description: [id number] - WC PHYS LEVEL 4 - NEW PT ICD-10 Description Diagnosis E11.621 Type 2 diabetes mellitus with foot ulcer Modifier: Quantity: 1 CPT4 Code: [id number] Description: [id number] - WC PHYS SUBQ TISS 20 SQ CM ICD-10 Description Diagnosis E11.621 Type 2 diabetes mellitus with foot ulcer Modifier: Quantity: 1 Electronic Signature(s) Signed: 12/30/2015 4:29:49 PM By: Linton Ham MD Entered By: Linton Ham on 12/30/2015 09:49:58

## 2015-12-31 NOTE — Progress Notes (Signed)
JAKELA, LIZARRAGA ([id number]) Visit Report for 12/30/2015 Allergy List Details Patient Name: [name], [name] Date of Service: 12/30/2015 8:00 AM Medical Record Patient Account Number: [id number] Number: Treating RN: Cornell Barman [birth year] (64 y.o. Other Clinician: Date of Birth/Sex: Female) Treating ROBSON, MICHAEL Primary Care Physician/Extender: Quincy Sheehan, [place] Physician: Referring Physician: Leonard Schwartz Weeks in Treatment: 0 Allergies Active Allergies Soma Flexeril Allergy Notes Electronic Signature(s) Signed: 12/31/2015 8:04:32 AM By: Gretta Cool, RN, BSN, Kim RN, BSN Entered By: Gretta Cool, RN, BSN, Kim on 12/30/2015 08:21:44 [name] ([id number]) -------------------------------------------------------------------------------- Arrival Information Details Patient Name: [name] Date of Service: 12/30/2015 8:00 AM Medical Record Patient Account Number: [id number] Number: Treating RN: Cornell Barman 27-Aug-1950 (64 y.o. Other Clinician: Date of Birth/Sex: Female) Treating ROBSON, MICHAEL Primary Care Physician/Extender: Quincy Sheehan, R[place]IL Physician: Referring Physician: Izell [place] in Treatment: 0 Visit Information Patient Arrived: Ambulatory Arrival Time: 08:17 Accompanied By: self Transfer Assistance: None Patient Identification Verified: Yes Secondary Verification Process Yes Completed: Patient Requires Transmission- No Based Precautions: Patient Has Alerts: Yes Patient Alerts: Patient on Blood Thinner 81mg  Aspirin DM II Electronic Signature(s) Signed: 12/31/2015 8:04:32 AM By: Gretta Cool, RN, BSN, Kim RN, BSN Entered By: Gretta Cool, RN, BSN, Kim on 12/30/2015 08:20:41 [name] ([id number]) -------------------------------------------------------------------------------- Clinic Level of Care Assessment Details Patient Name: [name] Date of Service: 12/30/2015 8:00 AM Medical Record Patient Account Number:  [id number] Number: Treating RN: Cornell Barman 1951-02-24 (64 y.o. Other Clinician: Date of Birth/Sex: Female) Treating ROBSON, MICHAEL Primary Care Physician/Extender: Quincy Sheehan, [place] Physician: Referring Physician: Izell [place] in Treatment: 0 Clinic Level of Care Assessment Items TOOL 1 Quantity Score []  - Use when EandM and Procedure is performed on INITIAL visit 0 ASSESSMENTS - Nursing Assessment / Reassessment X - General Physical Exam (combine w/ comprehensive assessment (listed just 1 20 below) when performed on new pt. evals) X - Comprehensive Assessment (HX, ROS, Risk Assessments, Wounds Hx, etc.) 1 25 ASSESSMENTS - Wound and Skin Assessment / Reassessment []  - Dermatologic / Skin Assessment (not related to wound area) 0 ASSESSMENTS - Ostomy and/or Continence Assessment and Care []  - Incontinence Assessment and Management 0 []  - Ostomy Care Assessment and Management (repouching, etc.) 0 PROCESS - Coordination of Care X - Simple Patient / Family Education for ongoing care 1 15 []  - Complex (extensive) Patient / Family Education for ongoing care 0 X - Staff obtains Consents, Records, Test Results / Process Orders 1 10 []  - Staff telephones HHA, Nursing Homes / Clarify orders / etc 0 []  - Routine Transfer to another Facility (non-emergent condition) 0 []  - Routine Hospital Admission (non-emergent condition) 0 X - New Admissions / Biomedical engineer / Ordering NPWT, Apligraf, etc. 1 15 []  - Emergency Hospital Admission (emergent condition) 0 PROCESS - Special Needs []  - Pediatric / Minor Patient Management 0 [name], [name] ([id number]) []  - Isolation Patient Management 0 []  - Hearing / Language / Visual special needs 0 []  - Assessment of Community assistance (transportation, D/C planning, etc.) 0 []  - Additional assistance / Altered mentation 0 []  - Support Surface(s) Assessment (bed, cushion, seat, etc.) 0 INTERVENTIONS - Miscellaneous []  -  External ear exam 0 []  - Patient Transfer (multiple staff / Civil Service fast streamer / Similar devices) 0 []  - Simple Staple / Suture removal (25 or less) 0 []  - Complex Staple / Suture removal (26 or more) 0 []  - Hypo/Hyperglycemic Management (do not check if billed separately) 0 X - Ankle / Brachial Index (ABI) - do not check if billed  separately 1 15 Has the patient been seen at the hospital within the last three years: Yes Total Score: 100 Level Of Care: New/Established - Level 3 Electronic Signature(s) Signed: 12/31/2015 8:04:32 AM By: Gretta Cool, RN, BSN, Kim RN, BSN Entered By: Gretta Cool, RN, BSN, Kim on 12/30/2015 09:13:54 [name] ([id number]) -------------------------------------------------------------------------------- Encounter Discharge Information Details Patient Name: [name] Date of Service: 12/30/2015 8:00 AM Medical Record Patient Account Number: [id number] Number: Treating RN: Cornell Barman 1950-10-11 (64 y.o. Other Clinician: Date of Birth/Sex: Female) Treating ROBSON, MICHAEL Primary Care Physician/Extender: Quincy Sheehan, [place] Physician: Referring Physician: Izell [place] in Treatment: 0 Encounter Discharge Information Items Discharge Pain Level: 0 Discharge Condition: Stable Ambulatory Status: Ambulatory Discharge Destination: Home Transportation: Private Auto Accompanied By: self Schedule Follow-up Appointment: Yes Medication Reconciliation completed and provided to Patient/Care Yes Jyden Kromer: Provided on Clinical Summary of Care: 12/30/2015 Form Type Recipient Paper Patient DS Electronic Signature(s) Signed: 12/31/2015 8:04:32 AM By: Gretta Cool RN, BSN, Kim RN, BSN Previous Signature: 12/30/2015 9:15:54 AM Version By: Ruthine Dose Entered By: Gretta Cool RN, BSN, Kim on 12/30/2015 09:17:00 [name] ([id number]) -------------------------------------------------------------------------------- Lower Extremity Assessment Details Patient Name:  [name] Date of Service: 12/30/2015 8:00 AM Medical Record Patient Account Number: [id number] Number: Treating RN: Cornell Barman 1950/11/19 (64 y.o. Other Clinician: Date of Birth/Sex: Female) Treating ROBSON, MICHAEL Primary Care Physician/Extender: Quincy Sheehan, [place] Physician: Referring Physician: Izell  in Treatment: 0 Vascular Assessment Pulses: Posterior Tibial Palpable: [Left:Yes] [Right:Yes] Dorsalis Pedis Palpable: [Left:Yes] [Right:Yes] Extremity colors, hair growth, and conditions: Hair Growth on Extremity: [Left:No] [Right:No] Temperature of Extremity: [Left:Warm] [Right:Warm] Capillary Refill: [Left:< 3 seconds] [Right:< 3 seconds] Dependent Rubor: [Left:No] [Right:No] Blanched when Elevated: [Left:No] [Right:No] Lipodermatosclerosis: [Left:No] [Right:No] Blood Pressure: Brachial: [Left:120] [Right:122] Dorsalis Pedis: 120 [Left:Dorsalis Pedis: 110] Ankle: Posterior Tibial: 160 [Left:Posterior Tibial: 160 1.31] [Right:1.31] Toe Nail Assessment Left: Right: Thick: No No Discolored: No No Deformed: No No Improper Length and Hygiene: No No Electronic Signature(s) Signed: 12/31/2015 8:04:32 AM By: Gretta Cool, RN, BSN, Kim RN, BSN Entered By: Gretta Cool, RN, BSN, Kim on 12/30/2015 08:42:23 [name] ([id number]) -------------------------------------------------------------------------------- Multi Wound Chart Details Patient Name: [name] Date of Service: 12/30/2015 8:00 AM Medical Record Patient Account Number: [id number] Number: Treating RN: Cornell Barman 21-Jul-1950 (64 y.o. Other Clinician: Date of Birth/Sex: Female) Treating ROBSON, MICHAEL Primary Care Physician/Extender: Quincy Sheehan, [place] Physician: Referring Physician: Leonard Schwartz Weeks in Treatment: 0 Vital Signs Height(in): 66 Pulse(bpm): 79 Weight(lbs): 195 Blood Pressure 148/77 (mmHg): Body Mass Index(BMI): 31 Temperature(F): 98.0 Respiratory  Rate 20 (breaths/min): Photos: [N/A:N/A] Wound Location: Right Metatarsal head fifth N/A N/A - Lateral Wounding Event: Pressure Injury N/A N/A Primary Etiology: Diabetic Wound/Ulcer of N/A N/A the Lower Extremity Secondary Etiology: Pressure Ulcer N/A N/A Comorbid History: Chronic sinus N/A N/A problems/congestion, Asthma, Hypertension, Type II Diabetes, Neuropathy Date Acquired: 12/11/2015 N/A N/A Weeks of Treatment: 0 N/A N/A Wound Status: Open N/A N/A Measurements L x W x D 0.9x0.3x0.5 N/A N/A (cm) Area (cm) : 0.212 N/A N/A Volume (cm) : 0.106 N/A N/A Classification: Grade 1 N/A N/A Exudate Amount: Small N/A N/A Utz, Makaelyn ([id number]) Exudate Type: Serous N/A N/A Exudate Color: amber N/A N/A Wound Margin: Flat and Intact N/A N/A Granulation Amount: None Present (0%) N/A N/A Necrotic Amount: None Present (0%) N/A N/A Exposed Structures: Fascia: No N/A N/A Fat: No Tendon: No Muscle: No Joint: No Bone: No Limited to Skin Breakdown Periwound Skin Texture: Induration: Yes N/A N/A Callus: Yes Edema: No Excoriation: No Crepitus: No  Fluctuance: No Friable: No Rash: No Scarring: No Periwound Skin Maceration: No N/A N/A Moisture: Moist: No Dry/Scaly: No Periwound Skin Color: Atrophie Blanche: No N/A N/A Cyanosis: No Ecchymosis: No Erythema: No Hemosiderin Staining: No Mottled: No Pallor: No Rubor: No Tenderness on No N/A N/A Palpation: Wound Preparation: Ulcer Cleansing: Other: N/A N/A SOAP AND WATER Topical Anesthetic Applied: Other: LIDOCAINE 4% Treatment Notes Electronic Signature(s) Signed: 12/31/2015 8:04:32 AM By: Gretta Cool, RN, BSN, Kim RN, BSN Entered By: Gretta Cool, RN, BSN, Kim on 12/30/2015 08:52:53 [name] ([id number]) -------------------------------------------------------------------------------- [place] Details Patient Name: [name] Date of Service: 12/30/2015 8:00 AM Medical Record Patient Account  Number: [id number] Number: Treating RN: Cornell Barman 14-Jun-1950 (64 y.o. Other Clinician: Date of Birth/Sex: Female) Treating ROBSON, MICHAEL Primary Care Physician/Extender: Quincy Sheehan, [place] Physician: Referring Physician: Izell [place] in Treatment: 0 Active Inactive Abuse / Safety / Falls / Self Care Management Nursing Diagnoses: Potential for falls Goals: Patient will remain injury free Date Initiated: 12/30/2015 Goal Status: Active Interventions: Assess self care needs on admission and as needed Notes: Nutrition Nursing Diagnoses: Impaired glucose control: actual or potential Goals: Patient/caregiver verbalizes understanding of need to maintain therapeutic glucose control per primary care physician Date Initiated: 12/30/2015 Goal Status: Active Interventions: Assess patient nutrition upon admission and as needed per policy Notes: Orientation to the Wound Care Program Nursing Diagnoses: Knowledge deficit related to the wound healing center program [place], [name] ([id number]) Goals: Patient/caregiver will verbalize understanding of the [place] Date Initiated: 12/30/2015 Goal Status: Active Interventions: Provide education on orientation to the wound center Notes: Pressure Nursing Diagnoses: Knowledge deficit related to management of pressures ulcers Goals: Patient will remain free from development of additional pressure ulcers Date Initiated: 12/30/2015 Goal Status: Active Patient will remain free of pressure ulcers Date Initiated: 12/30/2015 Goal Status: Active Interventions: Assess offloading mechanisms upon admission and as needed Notes: Wound/Skin Impairment Nursing Diagnoses: Impaired tissue integrity Goals: Patient/caregiver will verbalize understanding of skin care regimen Date Initiated: 12/30/2015 Goal Status: Active Ulcer/skin breakdown will heal within 14 weeks Date Initiated: 12/30/2015 Goal Status:  Active Interventions: Assess patient/caregiver ability to obtain necessary supplies Notes: [name], [name] ([id number]) Electronic Signature(s) Signed: 12/31/2015 8:04:32 AM By: Gretta Cool, RN, BSN, Kim RN, BSN Entered By: Gretta Cool, RN, BSN, Kim on 12/30/2015 08:52:43 [name] ([id number]) -------------------------------------------------------------------------------- Pain Assessment Details Patient Name: [name] Date of Service: 12/30/2015 8:00 AM Medical Record Patient Account Number: [id number] Number: Treating RN: Cornell Barman 1950-08-14 (64 y.o. Other Clinician: Date of Birth/Sex: Female) Treating ROBSON, MICHAEL Primary Care Physician/Extender: Quincy Sheehan, [place] Physician: Referring Physician: Izell [place] in Treatment: 0 Active Problems Location of Pain Severity and Description of Pain Patient Has Paino No Site Locations With Dressing Change: No Pain Management and Medication Current Pain Management: Electronic Signature(s) Signed: 12/31/2015 8:04:32 AM By: Gretta Cool, RN, BSN, Kim RN, BSN Entered By: Gretta Cool, RN, BSN, Kim on 12/30/2015 08:20:52 [name] ([id number]) -------------------------------------------------------------------------------- Patient/Caregiver Education Details Patient Name: [name] Date of Service: 12/30/2015 8:00 AM Medical Record Patient Account Number: [id number] Number: Treating RN: Cornell Barman [birth year] (64 y.o. Other Clinician: Date of Birth/Gender: Female) Treating ROBSON, MICHAEL Primary Care Physician/Extender: Verne Spurr Physician: Suella [place] in Treatment: 0 Referring Physician: Leonard Schwartz Education Assessment Education Provided To: Patient Education Topics Provided Wound/Skin Impairment: Handouts: Caring for Your Ulcer Methods: Demonstration, Explain/Verbal Responses: State content correctly Electronic Signature(s) Signed: 12/31/2015 8:04:32 AM By: Gretta Cool, RN, BSN, Kim RN,  BSN Entered By: Gretta Cool, RN, BSN, Kim on  12/30/2015 09:17:14 [name], [name] ([id number]) -------------------------------------------------------------------------------- Wound Assessment Details Patient Name: [name], [name] Date of Service: 12/30/2015 8:00 AM Medical Record Patient Account Number: [id number] Number: Treating RN: Cornell Barman [birth year] (64 y.o. Other Clinician: Date of Birth/Sex: Female) Treating ROBSON, MICHAEL Primary Care Physician/Extender: Quincy Sheehan, [place] Physician: Referring Physician: Leonard Schwartz Weeks in Treatment: 0 Wound Status Wound Number: 1 Primary Diabetic Wound/Ulcer of the Lower Etiology: Extremity Wound Location: Right Metatarsal head fifth - Lateral Secondary Pressure Ulcer Etiology: Wounding Event: Pressure Injury Wound Open Date Acquired: 12/11/2015 Status: Weeks Of Treatment: 0 Comorbid Chronic sinus problems/congestion, Clustered Wound: No History: Asthma, Hypertension, Type II Diabetes, Neuropathy Photos Wound Measurements Length: (cm) 0.9 Width: (cm) 0.3 Depth: (cm) 0.5 Area: (cm) 0.212 Volume: (cm) 0.106 % Reduction in Area: 0% % Reduction in Volume: 0% Wound Description Classification: Grade 1 Wound Margin: Flat and Intact Exudate Amount: Medium Exudate Type: Serous Exudate Color: amber Foul Odor After Cleansing: No Wound Bed [name], [name] ([id number]) Granulation Amount: None Present (0%) Exposed Structure Necrotic Amount: None Present (0%) Fascia Exposed: No Fat Layer Exposed: No Tendon Exposed: No Muscle Exposed: No Joint Exposed: No Bone Exposed: No Limited to Skin Breakdown Periwound Skin Texture Texture Color No Abnormalities Noted: No No Abnormalities Noted: No Callus: Yes Atrophie Blanche: No Crepitus: No Cyanosis: No Excoriation: No Ecchymosis: No Fluctuance: No Erythema: No Friable: No Hemosiderin Staining: No Induration: Yes Mottled: No Localized Edema: No Pallor: No Rash:  No Rubor: No Scarring: No Moisture No Abnormalities Noted: No Dry / Scaly: No Maceration: No Moist: No Wound Preparation Ulcer Cleansing: Other: SOAP AND WATER, Topical Anesthetic Applied: Other: LIDOCAINE 4%, Treatment Notes Wound #1 (Right, Lateral Metatarsal head fifth) 1. Cleansed with: Clean wound with Normal Saline 2. Anesthetic Topical Lidocaine 4% cream to wound bed prior to debridement 4. Dressing Applied: Aquacel Ag 5. Secondary Dressing Applied Dry Gauze Notes felt for off-loading and off loading shoe Electronic Signature(s) Signed: 12/31/2015 8:04:32 AM By: Gretta Cool, RN, BSN, Kim RN, BSN [name], [name] ([id number]) Entered By: Gretta Cool, RN, BSN, Kim on 12/30/2015 11:15:12 [name] ([id number]) -------------------------------------------------------------------------------- [place] Details Patient Name: [name] Date of Service: 12/30/2015 8:00 AM Medical Record Patient Account Number: [id number] Number: Treating RN: Cornell Barman 20-Sep-1950 (64 y.o. Other Clinician: Date of Birth/Sex: Female) Treating ROBSON, MICHAEL Primary Care Physician/Extender: Quincy Sheehan, [place] Physician: Referring Physician: Izell [place] in Treatment: 0 Vital Signs Time Taken: 08:20 Temperature (F): 98.0 Height (in): 66 Pulse (bpm): 79 Source: Stated Respiratory Rate (breaths/min): 20 Weight (lbs): 195 Blood Pressure (mmHg): 148/77 Source: Stated Reference Range: 80 - 120 mg / dl Body Mass Index (BMI): 31.5 Electronic Signature(s) Signed: 12/31/2015 8:04:32 AM By: Gretta Cool, RN, BSN, Kim RN, BSN Entered By: Gretta Cool, RN, BSN, Kim on 12/30/2015 08:20:54

## 2015-12-31 NOTE — Progress Notes (Signed)
Julia, Flowers ([id number]) Visit Report for 12/30/2015 Abuse/Suicide Risk Screen Details Patient Name: [name], [name] Date of Service: 12/30/2015 8:00 AM Medical Record Patient Account Number: [id number] Number: Treating RN: Cornell Barman 10-20-1950 (65 y.o. Other Clinician: Date of Birth/Sex: Female) Treating ROBSON, MICHAEL Primary Care Physician/Extender: Quincy Sheehan, RUSHIL Physician: Referring Physician: Leonard Schwartz Weeks in Treatment: 0 Abuse/Suicide Risk Screen Items Answer ABUSE/SUICIDE RISK SCREEN: Has anyone close to you tried to hurt or harm you recentlyo No Do you feel uncomfortable with anyone in your familyo No Has anyone forced you do things that you didnot want to doo No Do you have any thoughts of harming yourselfo No Patient displays signs or symptoms of abuse and/or neglect. No Electronic Signature(s) Signed: 12/31/2015 8:04:32 AM By: Gretta Cool, RN, BSN, Kim RN, BSN Entered By: Gretta Cool, RN, BSN, Kim on 12/30/2015 [id number] [name] ([id number]) -------------------------------------------------------------------------------- Activities of Daily Living Details Patient Name: [name] Date of Service: 12/30/2015 8:00 AM Medical Record Patient Account Number: [id number] Number: Treating RN: Cornell Barman October 10, 1950 (65 y.o. Other Clinician: Date of Birth/Sex: Female) Treating ROBSON, MICHAEL Primary Care Physician/Extender: Quincy Sheehan, RUSHIL Physician: Referring Physician: Izell [place] in Treatment: 0 Activities of Daily Living Items Answer Activities of Daily Living (Please select one for each item) Drive Automobile Completely Able Take Medications Completely Able Use Telephone Completely Able Care for Appearance Completely Able Use Toilet Completely Able Bath / Shower Completely Able Dress Self Completely Able Feed Self Completely Able Walk Completely Able Get In / Out Bed Completely Able Housework Completely Able Prepare  Meals Completely [place] for Self Completely Able Electronic Signature(s) Signed: 12/31/2015 8:04:32 AM By: Gretta Cool, RN, BSN, Kim RN, BSN Entered By: Gretta Cool, RN, BSN, Kim on 12/30/2015 08:28:19 [name] ([id number]) -------------------------------------------------------------------------------- Education Assessment Details Patient Name: [name] Date of Service: 12/30/2015 8:00 AM Medical Record Patient Account Number: [id number] Number: Treating RN: Cornell Barman Oct 18, 1950 (65 y.o. Other Clinician: Date of Birth/Sex: Female) Treating ROBSON, MICHAEL Primary Care Physician/Extender: Quincy Sheehan, RUSHIL Physician: Referring Physician: Izell [place] in Treatment: 0 Primary Learner Assessed: Patient Learning Preferences/Education Level/Primary Language Learning Preference: Explanation, Demonstration, Printed Material Highest Education Level: College or Above Preferred Language: English Cognitive Barrier Assessment/Beliefs Language Barrier: No Translator Needed: No Memory Deficit: No Emotional Barrier: No Cultural/Religious Beliefs Affecting Medical No Care: Physical Barrier Assessment Impaired Vision: Yes Glasses Impaired Hearing: No Decreased Hand dexterity: No Knowledge/Comprehension Assessment Knowledge Level: High Comprehension Level: High Ability to understand written High instructions: Ability to understand verbal High instructions: Motivation Assessment Anxiety Level: Calm Cooperation: Cooperative Education Importance: Acknowledges Need Interest in Health Problems: Asks Questions Perception: Coherent Willingness to Engage in Self- High Management Activities: [place], [place] ([id number]) Readiness to Engage in Self- Management Activities: Electronic Signature(s) Signed: 12/31/2015 8:04:32 AM By: Gretta Cool, RN, BSN, Kim RN, BSN Entered By: Gretta Cool, RN, BSN, Kim on 12/30/2015 08:29:01 [name]  ([id number]) -------------------------------------------------------------------------------- Fall Risk Assessment Details Patient Name: [name] Date of Service: 12/30/2015 8:00 AM Medical Record Patient Account Number: [id number] Number: Treating RN: Cornell Barman May 05, 1951 (65 y.o. Other Clinician: Date of Birth/Sex: Female) Treating ROBSON, MICHAEL Primary Care Physician/Extender: Quincy Sheehan, RUSHIL Physician: Referring Physician: Izell Garza in Treatment: 0 Fall Risk Assessment Items Have you had 2 or more falls in the last 12 monthso 0 Yes Have you had any fall that resulted in injury in the last 12 monthso 0 No FALL RISK ASSESSMENT: History of falling - immediate or within 3 months  25 Yes Secondary diagnosis 0 No Ambulatory aid None/bed rest/wheelchair/nurse 0 Yes Crutches/cane/walker 0 No Furniture 0 No IV Access/Saline Lock 0 No Gait/Training Normal/bed rest/immobile 0 Yes Weak 0 No Impaired 0 No Mental Status Oriented to own ability 0 Yes Electronic Signature(s) Signed: 12/31/2015 8:04:32 AM By: Gretta Cool, RN, BSN, Kim RN, BSN Entered By: Gretta Cool, RN, BSN, Kim on 12/30/2015 08:29:58 [name] ([id number]) -------------------------------------------------------------------------------- Foot Assessment Details Patient Name: [name] Date of Service: 12/30/2015 8:00 AM Medical Record Patient Account Number: [id number] Number: Treating RN: Cornell Barman 09/10/1950 (65 y.o. Other Clinician: Date of Birth/Sex: Female) Treating ROBSON, MICHAEL Primary Care Physician/Extender: Quincy Sheehan, RUSHIL Physician: Referring Physician: Leonard Schwartz Weeks in Treatment: 0 Foot Assessment Items Site Locations + = Sensation present, - = Sensation absent, C = Callus, U = Ulcer R = Redness, W = Warmth, M = Maceration, PU = Pre-ulcerative lesion F = Fissure, S = Swelling, D = Dryness Assessment Right: Left: Other Deformity: No No Prior Foot  Ulcer: No No Prior Amputation: No No Charcot Joint: No No Ambulatory Status: Ambulatory Without Help Gait: Steady Electronic Signature(s) Signed: 12/31/2015 8:04:32 AM By: Gretta Cool, RN, BSN, Kim RN, BSN Entered By: Gretta Cool, RN, BSN, Kim on 12/30/2015 08:33:30 [place], [name] ([id number]) [name] ([id number]) -------------------------------------------------------------------------------- Nutrition Risk Assessment Details Patient Name: [name] Date of Service: 12/30/2015 8:00 AM Medical Record Patient Account Number: [id number] Number: Treating RN: Cornell Barman 07-29-1950 (64 y.o. Other Clinician: Date of Birth/Sex: Female) Treating ROBSON, MICHAEL Primary Care Physician/Extender: Quincy Sheehan, RUSHIL Physician: Referring Physician: Leonard Schwartz Weeks in Treatment: 0 Height (in): 66 Weight (lbs): 195 Body Mass Index (BMI): 31.5 Nutrition Risk Assessment Items NUTRITION RISK SCREEN: I have an illness or condition that made me change the kind and/or 0 No amount of food I eat I eat fewer than two meals per day 3 Yes I eat few fruits and vegetables, or milk products 0 No I have three or more drinks of beer, liquor or wine almost every day 0 No I have tooth or mouth problems that make it hard for me to eat 0 No I don't always have enough money to buy the food I need 0 No I eat alone most of the time 0 No I take three or more different prescribed or over-the-counter drugs a 1 Yes day Without wanting to, I have lost or gained 10 pounds in the last six 0 No months I am not always physically able to shop, cook and/or feed myself 0 No Nutrition Protocols Good Risk Protocol Provide education on elevated blood sugars Moderate Risk Protocol 0 and impact on wound healing, as applicable Electronic Signature(s) Signed: 12/31/2015 8:04:32 AM By: Gretta Cool, RN, BSN, Kim RN, BSN Entered By: Gretta Cool, RN, BSN, Kim on 12/30/2015 08:30:27

## 2016-01-01 ENCOUNTER — Ambulatory Visit (INDEPENDENT_AMBULATORY_CARE_PROVIDER_SITE_OTHER): Payer: Medicare Other | Admitting: Internal Medicine

## 2016-01-01 ENCOUNTER — Encounter: Payer: Self-pay | Admitting: Dietician

## 2016-01-01 ENCOUNTER — Encounter: Payer: Self-pay | Admitting: Internal Medicine

## 2016-01-01 ENCOUNTER — Ambulatory Visit (INDEPENDENT_AMBULATORY_CARE_PROVIDER_SITE_OTHER): Payer: Medicare Other | Admitting: Dietician

## 2016-01-01 VITALS — BP 111/64 | HR 109 | Temp 98.3°F | Ht 66.0 in | Wt 191.8 lb

## 2016-01-01 DIAGNOSIS — F17210 Nicotine dependence, cigarettes, uncomplicated: Secondary | ICD-10-CM | POA: Diagnosis not present

## 2016-01-01 DIAGNOSIS — Z7984 Long term (current) use of oral hypoglycemic drugs: Secondary | ICD-10-CM

## 2016-01-01 DIAGNOSIS — Z794 Long term (current) use of insulin: Secondary | ICD-10-CM

## 2016-01-01 DIAGNOSIS — E11621 Type 2 diabetes mellitus with foot ulcer: Secondary | ICD-10-CM | POA: Insufficient documentation

## 2016-01-01 DIAGNOSIS — E118 Type 2 diabetes mellitus with unspecified complications: Secondary | ICD-10-CM | POA: Diagnosis not present

## 2016-01-01 DIAGNOSIS — K219 Gastro-esophageal reflux disease without esophagitis: Secondary | ICD-10-CM | POA: Diagnosis not present

## 2016-01-01 DIAGNOSIS — L97519 Non-pressure chronic ulcer of other part of right foot with unspecified severity: Secondary | ICD-10-CM | POA: Diagnosis not present

## 2016-01-01 DIAGNOSIS — Z713 Dietary counseling and surveillance: Secondary | ICD-10-CM | POA: Diagnosis not present

## 2016-01-01 LAB — POCT GLYCOSYLATED HEMOGLOBIN (HGB A1C): HEMOGLOBIN A1C: 7.9

## 2016-01-01 LAB — GLUCOSE, CAPILLARY: GLUCOSE-CAPILLARY: 115 mg/dL — AB (ref 65–99)

## 2016-01-01 NOTE — Progress Notes (Signed)
   Subjective:    Patient ID: [name], female    DOB: 1950-09-05, 66 y.o.   MRN: [id number]  HPI [name] is a 65 year old female who presents today for diabetes. Please see assessment & plan for status of chronic medical problems.  Review of Systems A pertinent review of systems can be found underneath each problem charted below.    Objective:   Physical Exam  Constitutional: She is oriented to person, place, and time. She appears well-developed and well-nourished.  HENT:  Head: Normocephalic and atraumatic.  Eyes: Conjunctivae are normal. No scleral icterus.  Neck: No tracheal deviation present.  Cardiovascular: Normal rate and regular rhythm.  Exam reveals no gallop and no friction rub.   No murmur heard. Pulmonary/Chest: Effort normal. No stridor. No respiratory distress.  Neurological: She is alert and oriented to person, place, and time.  Skin: Skin is warm and dry. Rash (Blistering noted in the skin on the dorsal aspect of arms bilaterally) noted.  Right foot wound inspected after removal of bandage and notable for thickened area of ecchymosis and no drainage with pressure. Mild right streaking noted overlying the right dorsum with some warmth to touch. Dorsalis pedis pulse 2+.          Assessment & Plan:

## 2016-01-01 NOTE — Assessment & Plan Note (Addendum)
Assessment A1c 7.9 today, stable from 8.0 at 09/17/15. She acknowledged his adherence to her medications: Glipizide 10 mg daily at supper, metformin XR 1500 mg daily with breakfast, sitagliptin 100 mg daily. She had run out of sitagliptin was able to refill it with the assistance of our clinic diabetic educator, Ms. Butch Penny Plyler. She is scheduled to follow-up with ophthalmology in the next month and will have to cancel her previously scheduled colonoscopy as a result.  Glycemic target for her is an A1c less than 8% given how difficult it has been to achieve control with her. I suspect there is some degree of nonadherence to her medications at home given cost and a complicated social situation.  Plan -Continue current therapy: Glipizide 10 mg daily at supper, metformin XR 1500 mg daily with breakfast, sitagliptin 100 mg daily -Consider combining metformin and sitagliptin if feasible to minimize pill burden

## 2016-01-01 NOTE — Assessment & Plan Note (Signed)
Assessment She was seen in the emergency department on 12/25/15 for right foot sore without evidence of purulent drainage. Right foot x-ray was notable for soft tissue swelling adjacent to the distal aspect of fifth metatarsal with small amount of soft tissue air but no definite evidence of osteomyelitis. She was given ceftriaxone 1 g IV and prescribed doxycycline 100 mg to be taken twice daily 14 days.  Since her last visit to the ED, she did follow-up with Wound Care 2 days ago at which time she underwent debridement. She continues to take doxycycline 100 mg twice daily. Overall, she reports not feeling well and reports pain around the site of her debridement but denies any fever/chills.    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  Plan -Encouraged her to follow-up with Wound Care as scheduled -Continue current doxycycline therapy

## 2016-01-01 NOTE — Patient Instructions (Signed)
Keep up the good work. We will work on simplifying your medicines.  Please see me back in 3 months (or sooner if things change).

## 2016-01-01 NOTE — Progress Notes (Signed)
Medical Nutrition Therapy:  Appt start time: 1500 end time:  1550. Visit # 3 this year Last visit: 11/2015  Assessment:  Primary concerns today: Blood sugar control.  [name] reports she is sleeping better with turning off TV. Her blood sugars are better since she got januvia-  115 fasting, not higher than 160s later in the day even after meals.  She continues to complain of no appetite. Bowels are normal and without problem. Has a new foot would, so cannot walk. Is not supposed to bare much weight on it.  Learning Readiness: contemplating Barriers to learning/adherence to lifestyle change:stress  Meter download: no meter today.  Sleep: better- still in chair Activity: limited by foot wound Medicines: taking glipizide and januvia daily Food Intake: says she only ate popcorn today and two bottles of water, ham sandwich Wednesday and nothing yesterday 24-hr recall not done- suggest she keep food records      Progress Towards Goal(s):  In progress.   Nutritional Diagnosis:   .NB-1.6 Limited adherence to nutrition-related recommendations As related to competing values and lack of appetite  As evidenced by her report of her great grandson's care taking precedence and no appetite reported twice.    Intervention:  Nutrition education about protein and blood sugar affects on wound healing, congratulated her on improved sleep. Recommend new meter for after meal blood glucose testing and consider keeping a 24-72 hours food records Coordination of care: requested refill on januvia Handouts given during visit include: AVS Demonstrated degree of understanding via:  Teach Back   Monitoring/Evaluation:  Dietary intake, exercise, meter with readings, and body weight in 6 week(s).

## 2016-01-01 NOTE — Assessment & Plan Note (Signed)
Assessment She has not had any worsening heartburn though has had persistently poor appetite.   Plan -Switched her pantoprazole 20 mg to as needed dosing given her inconsistent meal schedule

## 2016-01-01 NOTE — Patient Instructions (Signed)
[place],  Blood sugar: Recommend new meter when you are ready. I can help.  Appetite: Consider writing down what you eat and drink for 1-3 days and bringing this back to a follow up appointment in 6 weeks to review it.   Great job improving your sleep. Hope it continues for you and it pays off with feeling better!  The goal for your blood sugar is an A1C < 7%. This is an average of about 150                                                                              with before meals less than 130 and                                                                             2 hours after meals less than 160-180  Hope to see you in September with a healed would!!  As always, feel free to call anytime.  Butch Penny [phone]

## 2016-01-04 NOTE — Progress Notes (Signed)
Internal Medicine Clinic Attending  I saw and evaluated the patient.  I personally confirmed the key portions of the history and exam documented by Dr. Patel,Rushil and I reviewed pertinent patient test results.  The assessment, diagnosis, and plan were formulated together and I agree with the documentation in the resident's note. 

## 2016-01-05 ENCOUNTER — Encounter (HOSPITAL_BASED_OUTPATIENT_CLINIC_OR_DEPARTMENT_OTHER): Payer: BLUE CROSS/BLUE SHIELD | Attending: Surgery

## 2016-01-06 ENCOUNTER — Encounter: Payer: Medicare Other | Admitting: Internal Medicine

## 2016-01-06 DIAGNOSIS — E1140 Type 2 diabetes mellitus with diabetic neuropathy, unspecified: Secondary | ICD-10-CM | POA: Diagnosis not present

## 2016-01-06 DIAGNOSIS — L97511 Non-pressure chronic ulcer of other part of right foot limited to breakdown of skin: Secondary | ICD-10-CM | POA: Diagnosis not present

## 2016-01-06 DIAGNOSIS — F17200 Nicotine dependence, unspecified, uncomplicated: Secondary | ICD-10-CM | POA: Diagnosis not present

## 2016-01-06 DIAGNOSIS — E11621 Type 2 diabetes mellitus with foot ulcer: Secondary | ICD-10-CM | POA: Diagnosis not present

## 2016-01-06 DIAGNOSIS — E1151 Type 2 diabetes mellitus with diabetic peripheral angiopathy without gangrene: Secondary | ICD-10-CM | POA: Diagnosis not present

## 2016-01-06 DIAGNOSIS — I10 Essential (primary) hypertension: Secondary | ICD-10-CM | POA: Diagnosis not present

## 2016-01-07 NOTE — Progress Notes (Signed)
Julia, Flowers ([id number]) Visit Report for 01/06/2016 Arrival Information Details Patient Name: Julia, Flowers Date of Service: 01/06/2016 10:00 AM Medical Record Patient Account Number: [id number] Number: Treating RN: Montey Hora [birth year] (65 y.o. Other Clinician: Date of Birth/Sex: Female) Treating ROBSON, MICHAEL Primary Care Physician/Extender: Quincy Sheehan, RUSHIL Physician: Referring Physician: Evette Georges in Treatment: 1 Visit Information History Since Last Visit Added or deleted any medications: No Patient Arrived: Ambulatory Any new allergies or adverse reactions: No Arrival Time: 09:55 Had a fall or experienced change in No Accompanied By: self activities of daily living that may affect Transfer Assistance: None risk of falls: Patient Identification Verified: No Signs or symptoms of abuse/neglect since last No Secondary Verification Process No visito Completed: Hospitalized since last visit: No Patient Requires Transmission- No Pain Present Now: Yes Based Precautions: Patient Has Alerts: Yes Patient Alerts: Patient on Blood Thinner 81mg  Aspirin DM II Electronic Signature(s) Signed: 01/06/2016 4:31:46 PM By: Montey Hora Entered By: Montey Hora on 01/06/2016 10:00:05 [name] ([id number]) -------------------------------------------------------------------------------- Encounter Discharge Information Details Patient Name: [name] Date of Service: 01/06/2016 10:00 AM Medical Record Patient Account Number: [id number] Number: Treating RN: Cornell Barman 05-01-1951 (65 y.o. Other Clinician: Date of Birth/Sex: Female) Treating ROBSON, MICHAEL Primary Care Physician/Extender: Quincy Sheehan, RUSHIL Physician: Referring Physician: Evette Georges in Treatment: 1 Encounter Discharge Information Items Discharge Pain Level: 0 Discharge Condition: Stable Ambulatory Status: Ambulatory Discharge Destination:  Home Transportation: Private Auto Accompanied By: self Schedule Follow-up Appointment: Yes Medication Reconciliation completed and provided to Patient/Care No [name]: Provided on Clinical Summary of Care: 01/06/2016 Form Type Recipient Paper Patient DS Electronic Signature(s) Signed: 01/06/2016 10:43:20 AM By: Montey Hora Previous Signature: 01/06/2016 10:37:34 AM Version By: Ruthine Dose Entered By: Montey Hora on 01/06/2016 10:43:20 [name] ([id number]) -------------------------------------------------------------------------------- Lower Extremity Assessment Details Patient Name: [name] Date of Service: 01/06/2016 10:00 AM Medical Record Patient Account Number: [id number] Number: Treating RN: Montey Hora 06-Apr-1951 (65 y.o. Other Clinician: Date of Birth/Sex: Female) Treating ROBSON, MICHAEL Primary Care Physician/Extender: Quincy Sheehan, RUSHIL Physician: Referring Physician: Charlott Rakes Weeks in Treatment: 1 Edema Assessment Assessed: [Left: No] [Right: No] Edema: [Left: N] [Right: o] Vascular Assessment Pulses: Posterior Tibial Dorsalis Pedis Palpable: [Right:Yes] Extremity colors, hair growth, and conditions: Extremity Color: [Right:Normal] Hair Growth on Extremity: [Right:Yes] Temperature of Extremity: [Right:Warm] Capillary Refill: [Right:< 3 seconds] Electronic Signature(s) Signed: 01/06/2016 4:31:46 PM By: Montey Hora Entered By: Montey Hora on 01/06/2016 10:08:44 [name] ([id number]) -------------------------------------------------------------------------------- Multi Wound Chart Details Patient Name: [name] Date of Service: 01/06/2016 10:00 AM Medical Record Patient Account Number: [id number] Number: Treating RN: Montey Hora May 15, 1951 (65 y.o. Other Clinician: Date of Birth/Sex: Female) Treating ROBSON, MICHAEL Primary Care Physician/Extender: Quincy Sheehan,  RUSHIL Physician: Referring Physician: Charlott Rakes Weeks in Treatment: 1 Vital Signs Height(in): 66 Pulse(bpm): 79 Weight(lbs): 195 Blood Pressure 115/70 (mmHg): Body Mass Index(BMI): 31 Temperature(F): 98.2 Respiratory Rate 18 (breaths/min): Photos: [N/A:N/A] Wound Location: Right Metatarsal head fifth N/A N/A - Lateral Wounding Event: Pressure Injury N/A N/A Primary Etiology: Diabetic Wound/Ulcer of N/A N/A the Lower Extremity Secondary Etiology: Pressure Ulcer N/A N/A Comorbid History: Chronic sinus N/A N/A problems/congestion, Asthma, Hypertension, Type II Diabetes, Neuropathy Date Acquired: 12/11/2015 N/A N/A Weeks of Treatment: 1 N/A N/A Wound Status: Open N/A N/A Measurements L x W x D 0.1x0.1x0.1 N/A N/A (cm) Area (cm) : 0.008 N/A N/A Volume (cm) : 0.001 N/A N/A % Reduction in Area: 96.20% N/A N/A % Reduction in Volume: 99.10% N/A N/A [name],  [name] ([id number]) Classification: Grade 1 N/A N/A Exudate Amount: Medium N/A N/A Exudate Type: Serous N/A N/A Exudate Color: amber N/A N/A Wound Margin: Flat and Intact N/A N/A Granulation Amount: None Present (0%) N/A N/A Necrotic Amount: None Present (0%) N/A N/A Exposed Structures: Fascia: No N/A N/A Fat: No Tendon: No Muscle: No Joint: No Bone: No Limited to Skin Breakdown Epithelialization: Small (1-33%) N/A N/A Periwound Skin Texture: Induration: Yes N/A N/A Callus: Yes Edema: No Excoriation: No Crepitus: No Fluctuance: No Friable: No Rash: No Scarring: No Periwound Skin Maceration: No N/A N/A Moisture: Moist: No Dry/Scaly: No Periwound Skin Color: Atrophie Blanche: No N/A N/A Cyanosis: No Ecchymosis: No Erythema: No Hemosiderin Staining: No Mottled: No Pallor: No Rubor: No Temperature: No Abnormality N/A N/A Tenderness on Yes N/A N/A Palpation: Wound Preparation: Ulcer Cleansing: Other: N/A N/A SOAP AND WATER Topical Anesthetic Applied: Other: LIDOCAINE 4% Treatment  Notes Electronic Signature(s) [name], [name] ([id number]) Signed: 01/06/2016 4:31:46 PM By: Montey Hora Entered By: Montey Hora on 01/06/2016 10:10:12 [name] ([id number]) -------------------------------------------------------------------------------- Multi-Disciplinary Care Plan Details Patient Name: [name] Date of Service: 01/06/2016 10:00 AM Medical Record Patient Account Number: [id number] Number: Treating RN: Montey Hora [birth year] (64 y.o. Other Clinician: Date of Birth/Sex: Female) Treating ROBSON, MICHAEL Primary Care Physician/Extender: Quincy Sheehan, RUSHIL Physician: Referring Physician: Evette Georges in Treatment: 1 Active Inactive Abuse / Safety / Falls / Self Care Management Nursing Diagnoses: Potential for falls Goals: Patient will remain injury free Date Initiated: 12/30/2015 Goal Status: Active Interventions: Assess self care needs on admission and as needed Notes: Nutrition Nursing Diagnoses: Impaired glucose control: actual or potential Goals: Patient/caregiver verbalizes understanding of need to maintain therapeutic glucose control per primary care physician Date Initiated: 12/30/2015 Goal Status: Active Interventions: Assess patient nutrition upon admission and as needed per policy Notes: Orientation to the Wound Care Program Nursing Diagnoses: Knowledge deficit related to the wound healing center program [name], [name] ([id number]) Goals: Patient/caregiver will verbalize understanding of the [facility] Date Initiated: 12/30/2015 Goal Status: Active Interventions: Provide education on orientation to the wound center Notes: Pressure Nursing Diagnoses: Knowledge deficit related to management of pressures ulcers Goals: Patient will remain free from development of additional pressure ulcers Date Initiated: 12/30/2015 Goal Status: Active Patient will remain free of pressure ulcers Date  Initiated: 12/30/2015 Goal Status: Active Interventions: Assess offloading mechanisms upon admission and as needed Notes: Wound/Skin Impairment Nursing Diagnoses: Impaired tissue integrity Goals: Patient/caregiver will verbalize understanding of skin care regimen Date Initiated: 12/30/2015 Goal Status: Active Ulcer/skin breakdown will heal within 14 weeks Date Initiated: 12/30/2015 Goal Status: Active Interventions: Assess patient/caregiver ability to obtain necessary supplies Notes: [name], [name] ([id number]) Electronic Signature(s) Signed: 01/06/2016 4:31:46 PM By: Montey Hora Entered By: Montey Hora on 01/06/2016 10:09:55 [name] ([id number]) -------------------------------------------------------------------------------- Pain Assessment Details Patient Name: [name] Date of Service: 01/06/2016 10:00 AM Medical Record Patient Account Number: [id number] Number: Treating RN: Montey Hora January 18, 1951 (64 y.o. Other Clinician: Date of Birth/Sex: Female) Treating ROBSON, MICHAEL Primary Care Physician/Extender: Quincy Sheehan, RUSHIL Physician: Referring Physician: Charlott Rakes Weeks in Treatment: 1 Active Problems Location of Pain Severity and Description of Pain Patient Has Paino Yes Site Locations Pain Location: Pain in Ulcers With Dressing Change: Yes Duration of the Pain. Constant / Intermittento Constant Rate the pain. Current Pain Level: 2 Worst Pain Level: 8 Least Pain Level: 1 Pain Management and Medication Current Pain Management: Notes Topical or injectable lidocaine is offered to patient for acute pain when surgical debridement  is performed. If needed, Patient is instructed to use over the counter pain medication for the following 24-48 hours after debridement. Wound care MDs do not prescribed pain medications. Patient has chronic pain or uncontrolled pain. Patient has been instructed to make an appointment with their Primary  Care Physician for pain management. Electronic Signature(s) Signed: 01/06/2016 4:31:46 PM By: Montey Hora Entered By: Montey Hora on 01/06/2016 10:00:35 [name] ([id number]) -------------------------------------------------------------------------------- Patient/Caregiver Education Details Patient Name: [name] Date of Service: 01/06/2016 10:00 AM Medical Record Patient Account Number: [id number] Number: Treating RN: Montey Hora 1950-08-19 (64 y.o. Other Clinician: Date of Birth/Gender: Female) Treating ROBSON, MICHAEL Primary Care Physician/Extender: Verne Spurr Physician: Weeks in Treatment: 1 Referring Physician: Charlott Rakes Education Assessment Education Provided To: Patient Education Topics Provided Wound/Skin Impairment: Handouts: Other: wound care as ordered Methods: Demonstration, Explain/Verbal Responses: State content correctly Electronic Signature(s) Signed: 01/06/2016 4:31:46 PM By: Montey Hora Entered By: Montey Hora on 01/06/2016 10:45:14 [name] ([id number]) -------------------------------------------------------------------------------- Wound Assessment Details Patient Name: [name] Date of Service: 01/06/2016 10:00 AM Medical Record Patient Account Number: [id number] Number: Treating RN: Montey Hora 21-Apr-1951 (64 y.o. Other Clinician: Date of Birth/Sex: Female) Treating ROBSON, MICHAEL Primary Care Physician/Extender: Quincy Sheehan, R[place]IL Physician: Referring Physician: Charlott Rakes Weeks in Treatment: 1 Wound Status Wound Number: 1 Primary Diabetic Wound/Ulcer of the Lower Etiology: Extremity Wound Location: Right, Lateral Metatarsal head fifth Secondary Pressure Ulcer Etiology: Wounding Event: Pressure Injury Wound Open Date Acquired: 12/11/2015 Status: Weeks Of Treatment: 1 Comorbid Chronic sinus problems/congestion, Clustered Wound: No History: Asthma, Hypertension, Type  II Diabetes, Neuropathy Photos Wound Measurements Length: (cm) 0.5 Width: (cm) 1.1 Depth: (cm) 0.2 Area: (cm) 0.432 Volume: (cm) 0.086 % Reduction in Area: -103.8% % Reduction in Volume: 18.9% Epithelialization: Small (1-33%) Tunneling: No Undermining: No Wound Description Classification: Grade 1 Wound Margin: Flat and Intact Exudate Amount: Medium Exudate Type: Serous Exudate Color: amber Foul Odor After Cleansing: No Wound Bed [name], [name] ([id number]) Granulation Amount: None Present (0%) Exposed Structure Necrotic Amount: None Present (0%) Fascia Exposed: No Fat Layer Exposed: No Tendon Exposed: No Muscle Exposed: No Joint Exposed: No Bone Exposed: No Limited to Skin Breakdown Periwound Skin Texture Texture Color No Abnormalities Noted: No No Abnormalities Noted: No Callus: Yes Atrophie Blanche: No Crepitus: No Cyanosis: No Excoriation: No Ecchymosis: No Fluctuance: No Erythema: No Friable: No Hemosiderin Staining: No Induration: Yes Mottled: No Localized Edema: No Pallor: No Rash: No Rubor: No Scarring: No Temperature / Pain Moisture Temperature: No Abnormality No Abnormalities Noted: No Tenderness on Palpation: Yes Dry / Scaly: No Maceration: No Moist: No Wound Preparation Ulcer Cleansing: Other: SOAP AND WATER, Topical Anesthetic Applied: Other: LIDOCAINE 4%, Treatment Notes Wound #1 (Right, Lateral Metatarsal head fifth) 1. Cleansed with: Clean wound with Normal Saline 2. Anesthetic Topical Lidocaine 4% cream to wound bed prior to debridement 4. Dressing Applied: Aquacel Ag 5. Secondary Dressing Applied Gauze and Kerlix/Conform Notes darco with peg assist Electronic Signature(s) Signed: 01/06/2016 4:31:46 PM By: [name], [name] ([id number]) Entered By: Montey Hora on 01/06/2016 10:28:16 [name] ([id number]) -------------------------------------------------------------------------------- [place]  Details Patient Name: [name] Date of Service: 01/06/2016 10:00 AM Medical Record Patient Account Number: [id number] Number: Treating RN: Montey Hora 30-Nov-1950 (64 y.o. Other Clinician: Date of Birth/Sex: Female) Treating ROBSON, MICHAEL Primary Care Physician/Extender: Quincy Sheehan, [place]IL Physician: Referring Physician: Charlott Rakes Weeks in Treatment: 1 Vital Signs Time Taken: 10:01 Temperature (F): 98.2 Height (in): 66 Pulse (bpm): 79 Weight (lbs): 195 Respiratory Rate (  breaths/min): 18 Body Mass Index (BMI): 31.5 Blood Pressure (mmHg): 115/70 Reference Range: 80 - 120 mg / dl Electronic Signature(s) Signed: 01/06/2016 4:31:46 PM By: Montey Hora Entered By: Montey Hora on 01/06/2016 10:01:23

## 2016-01-09 NOTE — Progress Notes (Signed)
SOLEA, OLP ([id number]) Visit Report for 01/06/2016 Chief Complaint Document Details Patient Name: [name], [name] Date of Service: 01/06/2016 10:00 AM Medical Record Patient Account Number: [id number] Number: Treating RN: Cornell Barman [birth year] (65 y.o. Other Clinician: Date of Birth/Sex: Female) Treating Meleena Munroe Primary Care Physician/Extender: Quincy Sheehan, RUSHIL Physician: Referring Physician: Evette Georges in Treatment: 1 Information Obtained from: Patient Chief Complaint Patient is here for a wound over her right fifth metatarsal head Electronic Signature(s) Signed: 01/06/2016 3:57:44 PM By: Linton Ham MD Entered By: Linton Ham on 01/06/2016 10:43:22 [name] ([id number]) -------------------------------------------------------------------------------- Debridement Details Patient Name: [name] Date of Service: 01/06/2016 10:00 AM Medical Record Patient Account Number: [id number] Number: Treating RN: Cornell Barman 1950-11-12 (65 y.o. Other Clinician: Date of Birth/Sex: Female) Treating Tymeshia Awan Primary Care Physician/Extender: Quincy Sheehan, RUSHIL Physician: Referring Physician: Charlott Rakes Weeks in Treatment: 1 Debridement Performed for Wound #1 Right,Lateral Metatarsal head fifth Assessment: Performed By: Physician Ricard Dillon, MD Debridement: Debridement Pre-procedure Yes Verification/Time Out Taken: Start Time: 10:16 Pain Control: Lidocaine 4% Topical Solution Level: Skin/Subcutaneous Tissue Total Area Debrided (L x 0.5 (cm) x 1.1 (cm) = 0.55 (cm) W): Tissue and other Viable, Non-Viable, Callus, Eschar, Fibrin/Slough, Subcutaneous material debrided: Instrument: Curette Bleeding: Moderate Hemostasis Achieved: Silver Nitrate End Time: 10:19 Procedural Pain: 0 Post Procedural Pain: 0 Response to Treatment: Procedure was tolerated well Post Debridement Measurements of Total Wound Length: (cm)  0.5 Width: (cm) 1.1 Depth: (cm) 0.2 Volume: (cm) 0.086 Post Procedure Diagnosis Same as Pre-procedure Electronic Signature(s) Signed: 01/06/2016 3:57:44 PM By: Linton Ham MD Signed: 01/08/2016 4:38:27 PM By: Gretta Cool RN, BSN, Kim RN, BSN Entered By: Linton Ham on 01/06/2016 10:42:38 [name], [name] ([id number]) [name] ([id number]) -------------------------------------------------------------------------------- HPI Details Patient Name: [name] Date of Service: 01/06/2016 10:00 AM Medical Record Patient Account Number: [id number] Number: Treating RN: Cornell Barman Jun 24, 1950 (65 y.o. Other Clinician: Date of Birth/Sex: Female) Treating Aloura Matsuoka Primary Care Physician/Extender: Verne Spurr Physician: Referring Physician: Evette Georges in Treatment: 1 History of Present Illness HPI Description: 12/30/15; this is a 65 year old type II diabetic with diabetic neuropathy. 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 She also has a known history of PAD with arterial studies from November 2016 showing reasonably normal ABIs 911 on the right and 1.14 on the left but quite produced T ABIs at 0.45 on the right and 0.29 on the left. She also is a continued smoker. She was recently in the emergency room with drainage coming out of a foot ulcer over the right fifth metatarsal head that is come on over the last 2 weeks. She tells me it took an awful long time for her to get diabetic shoes in spite of the amputation of the right first metatarsal ray in the fall. She only got the shoes a month ago. Apparently she had her inserts adjusted by Dr. Jacqualyn Posey of podiatric surgery who is been following her for routine foot care. Emergency department she had a negative x-ray for osteomyelitis and was given 2 weeks worth of doxycycline. This started on 12/25/15. On that trip to the emergency room she was  noted to have a 1 cm slough area on the ventral surface of her right foot at the base of the fifth digit mild warmth and swelling were noted there was no streaking and no purulence. She was discharged on doxycycline 01/06/16; the patient states that the healing sandal  straps irritate at the top of her foot therefore she has been using her own sandals. She is been on her feet quite a bit. Electronic Signature(s) Signed: 01/06/2016 3:57:44 PM By: Linton Ham MD Entered By: Linton Ham on 01/06/2016 10:51:25 [name] ([id number]) -------------------------------------------------------------------------------- Physical Exam Details Patient Name: [name] Date of Service: 01/06/2016 10:00 AM Medical Record Patient Account Number: [id number] Number: Treating RN: Cornell Barman 05-02-1951 (65 y.o. Other Clinician: Date of Birth/Sex: Female) Treating Ritesh Opara Primary Care Physician/Extender: Quincy Sheehan, RUSHIL Physician: Referring Physician: Charlott Rakes Weeks in Treatment: 1 Notes Wound exam; the area and questions on the plantar fifth metatarsal head. She wants again is needs debridement of circumferential callus nonviable subcutaneous tissue. Post debridement the base of this actually looks healthy. There is no evidence of surrounding infection. The wound does not probe to bone. Her peripheral pulses are palpable at the dorsalis pedis Electronic Signature(s) Signed: 01/06/2016 3:57:44 PM By: Linton Ham MD Entered By: Linton Ham on 01/06/2016 10:45:39 [name] ([id number]) -------------------------------------------------------------------------------- Physician Orders Details Patient Name: [name] Date of Service: 01/06/2016 10:00 AM Medical Record Patient Account Number: [id number] Number: Treating RN: Montey Hora 04-Aug-1950 (65 y.o. Other Clinician: Date of Birth/Sex: Female) Treating Nirvana Blanchett Primary Care  Physician/Extender: Quincy Sheehan, RUSHIL Physician: Referring Physician: Evette Georges in Treatment: 1 Verbal / Phone Orders: Yes Clinician: Montey Hora Read Back and Verified: Yes Diagnosis Coding Wound Cleansing Wound #1 Right,Lateral Metatarsal head fifth o Clean wound with Normal Saline. Anesthetic Wound #1 Right,Lateral Metatarsal head fifth o Topical Lidocaine 4% cream applied to wound bed prior to debridement Primary Wound Dressing Wound #1 Right,Lateral Metatarsal head fifth o Aquacel Ag Secondary Dressing Wound #1 Right,Lateral Metatarsal head fifth o Dry Gauze - felt for offloading Dressing Change Frequency Wound #1 Right,Lateral Metatarsal head fifth o Change dressing every other day. Follow-up Appointments Wound #1 Right,Lateral Metatarsal head fifth o Return Appointment in 1 week. Edema Control o Elevate legs to the level of the heart and pump ankles as often as possible Off-Loading Wound #1 Right,Lateral Metatarsal head fifth o Open toe surgical shoe to: - Right Additional Orders / Instructions [name], [name] ([id number]) Wound #1 Right,Lateral Metatarsal head fifth o Stop Smoking o Increase protein intake. Electronic Signature(s) Signed: 01/06/2016 3:57:44 PM By: Linton Ham MD Signed: 01/06/2016 4:31:46 PM By: Montey Hora Entered By: Montey Hora on 01/06/2016 10:19:15 [name] ([id number]) -------------------------------------------------------------------------------- Problem List Details Patient Name: [name] Date of Service: 01/06/2016 10:00 AM Medical Record Patient Account Number: [id number] Number: Treating RN: Cornell Barman 12-31-1950 (64 y.o. 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 Other Clinician: Date of Birth/Sex: Female) Treating Avalee Castrellon Primary Care Physician/Extender: Quincy Sheehan, RUSHIL Physician: Referring Physician: Evette Georges in Treatment: 1 Subjective Chief Complaint Information obtained from Patient  Patient is here for a wound over her right fifth metatarsal head History of Present Illness (HPI) 12/30/15; this is a 65 year old type II diabetic with diabetic neuropathy. 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 She also has a known history of PAD with arterial studies from November 2016 showing reasonably normal ABIs 911 on the right and 1.14 on the left but quite produced T ABIs at 0.45 on the right and 0.29 on the left. She also is a continued smoker. She was recently in the emergency room with drainage coming out of a foot ulcer over the right fifth metatarsal head that is come on over the last 2 weeks. She tells me it took an awful long time for her to get diabetic shoes in spite of the amputation of the right first metatarsal ray in the fall. She only got the shoes a month ago. Apparently she had her inserts adjusted by Dr.  Jacqualyn Posey of podiatric surgery who is been following her for routine foot care. Emergency department she had a negative x-ray for osteomyelitis and was given 2 weeks worth of doxycycline. This started on 12/25/15. On that trip to the emergency room she was noted to have a 1 cm slough area on the ventral surface of her right foot at the base of the fifth digit mild warmth and swelling were noted there was no streaking and no purulence. She was discharged on doxycycline 01/06/16; the patient states that the healing sandal straps irritate at the top of her foot therefore she has been using her own sandals. Objective Constitutional Vitals Time Taken: 10:01 AM, Height: 66 in, Weight: 195 lbs, BMI: 31.5, Temperature: 98.2 F, Pulse: 79 bpm, Respiratory Rate: 18 breaths/min, Blood Pressure: 115/70 mmHg. [place], [place] ([id number]) Integumentary (Hair, Skin) Wound #1 status is Open. Original cause of wound was Pressure Injury. The wound is located on the Right,Lateral Metatarsal head fifth. The wound measures 0.5cm length x 1.1cm width x 0.2cm depth; 0.432cm^2 area and 0.086cm^3 volume. The wound is limited to skin breakdown. There is no tunneling or undermining noted. There is a medium amount of serous drainage noted. The wound margin is flat and intact. There is no granulation within the wound bed. There is no necrotic tissue within the wound bed. The periwound skin appearance exhibited: Callus, Induration. The periwound skin appearance did not exhibit: Crepitus, Excoriation, Fluctuance, Friable, Localized Edema, Rash, Scarring, Dry/Scaly, Maceration, Moist, Atrophie Blanche, Cyanosis, Ecchymosis, Hemosiderin Staining, Mottled, Pallor, Rubor, Erythema. Periwound temperature was noted as No Abnormality. The periwound has tenderness on palpation. 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 There was a Skin/Subcutaneous Tissue Debridement [id number]) debridement with total area of 0.55 sq cm performed by Ricard Dillon, MD. with the following instrument(s): Curette to remove Viable and Non-Viable tissue/material including Fibrin/Slough, Eschar, Callus, and Subcutaneous after achieving pain control using Lidocaine 4% Topical Solution. A time out was conducted prior to the start of the procedure. A Moderate amount of bleeding was controlled with Silver Nitrate. The procedure was tolerated well with a pain level of 0 throughout and a pain level of 0 following the  procedure. Post Debridement Measurements: 0.5cm length x 1.1cm width x 0.2cm depth; 0.086cm^3 volume. Post procedure Diagnosis Wound #1: Same as Pre-Procedure Plan Wound Cleansing: [name], [name] ([id number]) Wound #1 Right,Lateral Metatarsal head fifth: Clean wound with Normal Saline. Anesthetic: Wound #1 Right,Lateral Metatarsal head fifth: Topical Lidocaine 4% cream applied to wound bed prior to debridement Primary Wound Dressing: Wound #1 Right,Lateral Metatarsal head fifth: Aquacel Ag Secondary Dressing: Wound #1 Right,Lateral Metatarsal head fifth: Dry Gauze - felt for offloading Dressing Change Frequency: Wound #1 Right,Lateral Metatarsal head fifth: Change dressing every other day. Follow-up Appointments: Wound #1 Right,Lateral Metatarsal head fifth: Return Appointment in 1 week. Edema Control: Elevate legs to the level of the heart and pump ankles as often as possible Off-Loading: Wound #1 Right,Lateral Metatarsal head fifth: Open toe surgical shoe to: - Right Additional Orders / Instructions: Wound #1 Right,Lateral Metatarsal head fifth: Stop  Smoking Increase protein intake. Continue with Aquacel AG We made adjustment to the sandal oTCC next week Electronic Signature(s) Signed: 01/06/2016 3:57:44 PM By: Linton Ham MD Entered By: Linton Ham on 01/06/2016 10:49:10 [name] ([id number]) -------------------------------------------------------------------------------- SuperBill Details Patient Name: [name] Date of Service: 01/06/2016 Medical Record Patient Account Number: [id number] Number: Treating RN: Cornell Barman 1951/01/03 (64 y.o. Other Clinician: Date of Birth/Sex: Female) Treating Aerica Rincon Primary Care Physician/Extender: Verne Spurr Physician: Weeks in Treatment: 1 Referring Physician: Charlott Rakes Diagnosis Coding ICD-10 Codes Code Description E11.621 Type 2 diabetes mellitus with foot ulcer L97.511 Non-pressure chronic ulcer of other part of right foot limited to breakdown of skin E11.51 Type 2 diabetes mellitus with diabetic peripheral angiopathy without gangrene Facility Procedures CPT4 Code: [id number] Description: [id number] - DEB SUBQ TISSUE 20 SQ CM/< ICD-10 Description Diagnosis E11.621 Type 2 diabetes mellitus with foot ulcer Modifier: Quantity: 1 Physician Procedures CPT4 Code: [id number] Description: [id number] - WC PHYS SUBQ TISS 20 SQ CM ICD-10 Description Diagnosis E11.621 Type 2 diabetes mellitus with foot ulcer Modifier: Quantity: 1 Electronic Signature(s) Signed: 01/06/2016 3:57:44 PM By: Linton Ham MD Entered By: Linton Ham on 01/06/2016 10:52:17

## 2016-01-13 ENCOUNTER — Ambulatory Visit: Payer: BLUE CROSS/BLUE SHIELD | Admitting: Internal Medicine

## 2016-01-14 DIAGNOSIS — H5032 Intermittent alternating esotropia: Secondary | ICD-10-CM | POA: Diagnosis not present

## 2016-01-14 DIAGNOSIS — H5022 Vertical strabismus, left eye: Secondary | ICD-10-CM | POA: Diagnosis not present

## 2016-01-15 ENCOUNTER — Encounter: Payer: Self-pay | Admitting: Podiatry

## 2016-01-15 ENCOUNTER — Ambulatory Visit (INDEPENDENT_AMBULATORY_CARE_PROVIDER_SITE_OTHER): Payer: Medicare Other | Admitting: Podiatry

## 2016-01-15 DIAGNOSIS — M79676 Pain in unspecified toe(s): Secondary | ICD-10-CM | POA: Diagnosis not present

## 2016-01-15 DIAGNOSIS — E1149 Type 2 diabetes mellitus with other diabetic neurological complication: Secondary | ICD-10-CM

## 2016-01-15 DIAGNOSIS — L89891 Pressure ulcer of other site, stage 1: Secondary | ICD-10-CM

## 2016-01-15 DIAGNOSIS — L97511 Non-pressure chronic ulcer of other part of right foot limited to breakdown of skin: Secondary | ICD-10-CM

## 2016-01-15 DIAGNOSIS — B351 Tinea unguium: Secondary | ICD-10-CM | POA: Diagnosis not present

## 2016-01-19 ENCOUNTER — Encounter: Payer: Medicare Other | Attending: Surgery | Admitting: Surgery

## 2016-01-19 DIAGNOSIS — F17218 Nicotine dependence, cigarettes, with other nicotine-induced disorders: Secondary | ICD-10-CM | POA: Diagnosis not present

## 2016-01-19 DIAGNOSIS — I10 Essential (primary) hypertension: Secondary | ICD-10-CM | POA: Diagnosis not present

## 2016-01-19 DIAGNOSIS — E1151 Type 2 diabetes mellitus with diabetic peripheral angiopathy without gangrene: Secondary | ICD-10-CM | POA: Diagnosis not present

## 2016-01-19 DIAGNOSIS — L97511 Non-pressure chronic ulcer of other part of right foot limited to breakdown of skin: Secondary | ICD-10-CM | POA: Diagnosis not present

## 2016-01-19 DIAGNOSIS — Z716 Tobacco abuse counseling: Secondary | ICD-10-CM | POA: Diagnosis not present

## 2016-01-19 DIAGNOSIS — E11621 Type 2 diabetes mellitus with foot ulcer: Secondary | ICD-10-CM | POA: Diagnosis not present

## 2016-01-19 DIAGNOSIS — E1140 Type 2 diabetes mellitus with diabetic neuropathy, unspecified: Secondary | ICD-10-CM | POA: Diagnosis not present

## 2016-01-19 DIAGNOSIS — Z7984 Long term (current) use of oral hypoglycemic drugs: Secondary | ICD-10-CM | POA: Insufficient documentation

## 2016-01-19 DIAGNOSIS — F419 Anxiety disorder, unspecified: Secondary | ICD-10-CM | POA: Diagnosis not present

## 2016-01-19 DIAGNOSIS — Z87891 Personal history of nicotine dependence: Secondary | ICD-10-CM | POA: Diagnosis not present

## 2016-01-19 NOTE — Progress Notes (Addendum)
SKYLAA, CLYMER ([id number]) Visit Report for 01/19/2016 Chief Complaint Document Details Patient Name: Julia, Flowers Date of Service: 01/19/2016 2:15 PM Medical Record Number: [id number] Patient Account Number: [id number] Date of Birth/Sex: 07-Sep-1950 (65 y.o. Female) Treating RN: Carolyne Fiscal, Debi Primary Care Physician: Charlott Rakes Other Clinician: Referring Physician: Charlott Rakes Treating Physician/Extender: Frann Rider in Treatment: 2 Information Obtained from: Patient Chief Complaint Patient is here for a wound over her right fifth metatarsal head Electronic Signature(s) Signed: 01/19/2016 2:45:11 PM By: Christin Fudge MD, FACS Entered By: Christin Fudge on 01/19/2016 14:45:10 [name] ([id number]) -------------------------------------------------------------------------------- HPI Details Patient Name: [name] Date of Service: 01/19/2016 2:15 PM Medical Record Number: [id number] Patient Account Number: [id number] Date of Birth/Sex: [birth year] (64 y.o. Female) Treating RN: Carolyne Fiscal, Debi Primary Care Physician: Charlott Rakes Other Clinician: Referring Physician: Charlott Rakes Treating Physician/Extender: Frann Rider in Treatment: 2 History of Present Illness HPI Description: 12/30/15; this is a 65 year old type II diabetic with diabetic neuropathy. 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 She also has a known history of PAD with arterial studies from November 2016 showing reasonably normal ABIs 911 on the right and 1.14 on the left but quite produced T ABIs at 0.45 on the right and 0.29 on the left. She also is a continued smoker. She was recently in the emergency room with drainage coming out of a foot ulcer over the right fifth metatarsal head that is come on over the last 2 weeks. She tells me it took an awful long time for her to get diabetic shoes in spite of the amputation of the  right first metatarsal ray in the fall. She only got the shoes a month ago. Apparently she had her inserts adjusted by Dr. Jacqualyn Posey of podiatric surgery who is been following her for routine foot care. Emergency department she had a negative x-ray for osteomyelitis and was given 2 weeks worth of doxycycline. This started on 12/25/15. On that trip to the emergency room she was noted to have a 1 cm slough area on the ventral surface of her right foot at the base of the fifth digit mild warmth and swelling were noted there was no streaking and no purulence. She was discharged on doxycycline 01/06/16; the patient states that the healing sandal straps irritate at the top of her foot therefore she has been using her own sandals. She is been on her feet quite a bit. 01/19/2016 -- the patient has had a gastroenteritis and today though she feels well she says she has not brought any one with her to drive and hence we will not be able to put a TCC on her right foot. Electronic Signature(s) Signed: 01/19/2016 2:45:51 PM By: Christin Fudge MD, FACS Entered By: Christin Fudge on 01/19/2016 14:45:51 [name] ([id number]) -------------------------------------------------------------------------------- Physical Exam Details Patient Name: [name] Date of Service: 01/19/2016 2:15 PM Medical Record Number: [id number] Patient Account Number: [id number] Date of Birth/Sex: 1950-07-19 (65 y.o. Female) Treating RN: Carolyne Fiscal, Debi Primary Care Physician: Charlott Rakes Other Clinician: Referring Physician: Charlott Rakes Treating Physician/Extender: Frann Rider in Treatment: 2 Constitutional . Pulse regular. Respirations normal and unlabored. Afebrile. . Eyes Nonicteric. Reactive to light. Ears, Nose, Mouth, and Throat Lips, teeth, and gums WNL.Marland Kitchen Moist mucosa without lesions. Neck supple and nontender. No palpable supraclavicular or cervical adenopathy. Normal sized without  goiter. Respiratory WNL. No retractions.. Breath sounds WNL, No rubs, rales, rhonchi, or wheeze.. Cardiovascular Heart  rhythm and rate regular, no murmur or gallop.. Pedal Pulses WNL. No clubbing, cyanosis or edema. Lymphatic No adneopathy. No adenopathy. No adenopathy. Musculoskeletal Adexa without tenderness or enlargement.. Digits and nails w/o clubbing, cyanosis, infection, petechiae, ischemia, or inflammatory conditions.. Integumentary (Hair, Skin) No suspicious lesions. No crepitus or fluctuance. No peri-wound warmth or erythema. No masses.Marland Kitchen Psychiatric Judgement and insight Intact.. No evidence of depression, anxiety, or agitation.. Notes the wound has been debrided by her podiatrist and there is no sharp debridement required today. There is minimal opening ulcer and there is no undermining. The wound does not probe down to bone. Electronic Signature(s) Signed: 01/19/2016 2:46:55 PM By: Christin Fudge MD, FACS Entered By: Christin Fudge on 01/19/2016 14:46:54 [name] ([id number]) -------------------------------------------------------------------------------- Physician Orders Details Patient Name: [name] Date of Service: 01/19/2016 2:15 PM Medical Record Number: [id number] Patient Account Number: [id number] Date of Birth/Sex: 03/04/1951 (65 y.o. Female) Treating RN: Montey Hora Primary Care Physician: Charlott Rakes Other Clinician: Referring Physician: Charlott Rakes Treating Physician/Extender: Frann Rider in Treatment: 2 Verbal / Phone Orders: Yes Clinician: Montey Hora Read Back and Verified: Yes Diagnosis Coding ICD-10 Coding Code Description E11.621 Type 2 diabetes mellitus with foot ulcer L97.511 Non-pressure chronic ulcer of other part of right foot limited to breakdown of skin E11.51 Type 2 diabetes mellitus with diabetic peripheral angiopathy without gangrene F17.218 Nicotine dependence, cigarettes, with other nicotine-induced  disorders Wound Cleansing Wound #1 Right,Lateral Metatarsal head fifth o Clean wound with Normal Saline. Anesthetic Wound #1 Right,Lateral Metatarsal head fifth o Topical Lidocaine 4% cream applied to wound bed prior to debridement Primary Wound Dressing Wound #1 Right,Lateral Metatarsal head fifth o Aquacel Ag Secondary Dressing Wound #1 Right,Lateral Metatarsal head fifth o Dry Gauze - felt for offloading Dressing Change Frequency Wound #1 Right,Lateral Metatarsal head fifth o Change dressing every other day. Follow-up Appointments Wound #1 Right,Lateral Metatarsal head fifth o Return Appointment in 1 week. Edema Control o Elevate legs to the level of the heart and pump ankles as often as possible Off-Loading Delauder, [place] ([id number]) Wound #1 Right,Lateral Metatarsal head fifth o Open toe surgical shoe to: - Right with peg assist Additional Orders / Instructions Wound #1 Right,Lateral Metatarsal head fifth o Stop Smoking o Increase protein intake. Electronic Signature(s) Signed: 01/19/2016 4:16:34 PM By: Christin Fudge MD, FACS Signed: 01/19/2016 4:35:40 PM By: Montey Hora Entered By: Montey Hora on 01/19/2016 14:45:55 Flowers, Julia Blades ([id number]) -------------------------------------------------------------------------------- Problem List Details Patient Name: [name] Date of Service: 01/19/2016 2:15 PM Medical Record Number: [id number] Patient Account Number: [id number] Date of Birth/Sex: 1950/06/15 (64 y.o. Female) Treating RN: Carolyne Fiscal, Debi Primary Care Physician: Charlott Rakes Other Clinician: Referring Physician: Charlott Rakes Treating Physician/Extender: Frann Rider in Treatment: 2 Active Problems ICD-10 Encounter Code Description Active Date Diagnosis E11.621 Type 2 diabetes mellitus with foot ulcer 12/30/2015 Yes L97.511 Non-pressure chronic ulcer of other part of right foot 12/30/2015 Yes limited to breakdown of  skin E11.51 Type 2 diabetes mellitus with diabetic peripheral 12/30/2015 Yes angiopathy without gangrene F17.218 Nicotine dependence, cigarettes, with other nicotine- 01/19/2016 Yes induced disorders Inactive Problems Resolved Problems Electronic Signature(s) Signed: 01/19/2016 2:45:03 PM By: Christin Fudge MD, FACS Entered By: Christin Fudge on 01/19/2016 14:45:03 [name] ([id number]) -------------------------------------------------------------------------------- Progress Note Details Patient Name: [name] Date of Service: 01/19/2016 2:15 PM Medical Record Number: [id number] Patient Account Number: [id number] Date of Birth/Sex: December 04, 1950 (65 y.o. Female) Treating RN: Carolyne Fiscal, Debi Primary Care Physician: Charlott Rakes Other Clinician: Referring Physician: Charlott Rakes Treating Physician/Extender: Frann Rider  in Treatment: 2 Subjective Chief Complaint Information obtained from Patient Patient is here for a wound over her right fifth metatarsal head History of Present Illness (HPI) 12/30/15; this is a 65 year old type II diabetic with diabetic neuropathy. 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 She also has a known history of PAD with arterial studies from November 2016 showing reasonably normal ABIs 911 on the right and 1.14 on the left but quite produced T ABIs at 0.45 on the right and 0.29 on the left. She also is a continued smoker. She was recently in the emergency room with drainage coming out of a foot ulcer over the right fifth metatarsal head that is come on over the last 2 weeks. She tells me it took an awful long time for her to get diabetic shoes in spite of the amputation of the right first metatarsal ray in the fall. She only got the shoes a month ago. Apparently she had her inserts adjusted by Dr. Jacqualyn Posey of podiatric surgery who is been following her for routine foot care. Emergency  department she had a negative x-ray for osteomyelitis and was given 2 weeks worth of doxycycline. This started on 12/25/15. On that trip to the emergency room she was noted to have a 1 cm slough area on the ventral surface of her right foot at the base of the fifth digit mild warmth and swelling were noted there was no streaking and no purulence. She was discharged on doxycycline 01/06/16; the patient states that the healing sandal straps irritate at the top of her foot therefore she has been using her own sandals. She is been on her feet quite a bit. 01/19/2016 -- the patient has had a gastroenteritis and today though she feels well she says she has not brought any one with her to drive and hence we will not be able to put a TCC on her right foot. Objective Constitutional Pulse regular. Respirations normal and unlabored. Afebrile. Vitals Time Taken: 2:22 PM, Height: 66 in, Weight: 195 lbs, BMI: 31.5, Temperature: 98.4 F, Pulse: 99 bpm, Respiratory Rate: 18 breaths/min, Blood Pressure: 66/41 mmHg. Julia, Flowers ([id number]) General Notes: MD notified immediately about BD see communication notes. Eyes Nonicteric. Reactive to light. Ears, Nose, Mouth, and Throat Lips, teeth, and gums WNL.Marland Kitchen Moist mucosa without lesions. Neck supple and nontender. No palpable supraclavicular or cervical adenopathy. Normal sized without goiter. Respiratory WNL. No retractions.. Breath sounds WNL, No rubs, rales, rhonchi, or wheeze.. Cardiovascular Heart rhythm and rate regular, no murmur or gallop.. Pedal Pulses WNL. No clubbing, cyanosis or edema. Lymphatic No adneopathy. No adenopathy. No adenopathy. Musculoskeletal Adexa without tenderness or enlargement.. Digits and nails w/o clubbing, cyanosis, infection, petechiae, ischemia, or inflammatory conditions.Marland Kitchen Psychiatric Judgement and insight Intact.. No evidence of depression, anxiety, or agitation.. General Notes: the wound has been debrided by her  podiatrist and there is no sharp debridement required today. There is minimal opening ulcer and there is no undermining. The wound does not probe down to bone. Integumentary (Hair, Skin) No suspicious lesions. No crepitus or fluctuance. No peri-wound warmth or erythema. No masses.. Wound #1 status is Open. Original cause of wound was Pressure Injury. The wound is located on the Right,Lateral Metatarsal head fifth. The wound measures 0.2cm length x 0.2cm width x 0.2cm depth; 0.031cm^2 area and 0.006cm^3 volume. The wound is limited to skin breakdown. There is no tunneling or undermining noted. There is a medium  amount of serous drainage noted. The wound margin is flat and intact. There is no granulation within the wound bed. There is a large (67-100%) amount of necrotic tissue within the wound bed including Eschar. The periwound skin appearance exhibited: Callus, Induration. The periwound skin appearance did not exhibit: Crepitus, Excoriation, Fluctuance, Friable, Localized Edema, Rash, Scarring, Dry/Scaly, Maceration, Moist, Atrophie Blanche, Cyanosis, Ecchymosis, Hemosiderin Staining, Mottled, Pallor, Rubor, Erythema. Periwound temperature was noted as No Abnormality. The periwound has tenderness on palpation. Assessment Julia, Flowers ([id number]) Active Problems ICD-10 E11.621 - Type 2 diabetes mellitus with foot ulcer L97.511 - Non-pressure chronic ulcer of other part of right foot limited to breakdown of skin E11.51 - Type 2 diabetes mellitus with diabetic peripheral angiopathy without gangrene F17.218 - Nicotine dependence, cigarettes, with other nicotine-induced disorders The fact that she does not have a driver I have opted not to place a total contact cast on her right foot. I have asked her to bring a driver along with her next week. Local care will be continued with Aquacel Ag, and an offloading Pegasys tissue I have spent 3 minutes discussing with her the need to completely give  up smoking and I have strongly advised her to do this in view of her diabetes mellitus with peripheral angiopathy. Plan Wound Cleansing: Wound #1 Right,Lateral Metatarsal head fifth: Clean wound with Normal Saline. Anesthetic: Wound #1 Right,Lateral Metatarsal head fifth: Topical Lidocaine 4% cream applied to wound bed prior to debridement Primary Wound Dressing: Wound #1 Right,Lateral Metatarsal head fifth: Aquacel Ag Secondary Dressing: Wound #1 Right,Lateral Metatarsal head fifth: Dry Gauze - felt for offloading Dressing Change Frequency: Wound #1 Right,Lateral Metatarsal head fifth: Change dressing every other day. Follow-up Appointments: Wound #1 Right,Lateral Metatarsal head fifth: Return Appointment in 1 week. Edema Control: Elevate legs to the level of the heart and pump ankles as often as possible Off-Loading: Wound #1 Right,Lateral Metatarsal head fifth: Open toe surgical shoe to: - Right with peg assist Additional Orders / Instructions: Wound #1 Right,Lateral Metatarsal head fifth: Julia, Flowers ([id number]) Stop Smoking Increase protein intake. The fact that she does not have a driver I have opted not to place a total contact cast on her right foot. I have asked her to bring a driver along with her next week. Local care will be continued with Aquacel Ag, and an offloading Pegasys tissue I have spent 3 minutes discussing with her the need to completely give up smoking and I have strongly advised her to do this in view of her diabetes mellitus with peripheral angiopathy. Electronic Signature(s) Signed: 01/19/2016 4:17:22 PM By: Christin Fudge MD, FACS Previous Signature: 01/19/2016 2:48:43 PM Version By: Christin Fudge MD, FACS Entered By: Christin Fudge on 01/19/2016 16:17:22 [name] ([id number]) -------------------------------------------------------------------------------- SuperBill Details Patient Name: [name] Date of Service: 01/19/2016 Medical  Record Number: [id number] Patient Account Number: [id number] Date of Birth/Sex: Jan 24, 1951 (64 y.o. Female) Treating RN: Carolyne Fiscal, Debi Primary Care Physician: Charlott Rakes Other Clinician: Referring Physician: Charlott Rakes Treating Physician/Extender: Frann Rider in Treatment: 2 Diagnosis Coding ICD-10 Codes Code Description E11.621 Type 2 diabetes mellitus with foot ulcer L97.511 Non-pressure chronic ulcer of other part of right foot limited to breakdown of skin E11.51 Type 2 diabetes mellitus with diabetic peripheral angiopathy without gangrene F17.218 Nicotine dependence, cigarettes, with other nicotine-induced disorders Facility Procedures CPT4 Code Description: [id number] - WOUND CARE VISIT-LEV 3 EST PT Modifier: Quantity: 1 CPT4 Code Description: [id number] CESSATION 3-10MINS ICD-10 Description Diagnosis E11.621 Type 2 diabetes mellitus with  foot ulcer F17.218 Nicotine dependence, cigarettes, with other nicotine- Modifier: induced disor Quantity: 1 ders Physician Procedures CPT4: Description Modifier Quantity Code [id number] - WC PHYS LEVEL 3 - EST PT 25 1 ICD-10 Description Diagnosis E11.621 Type 2 diabetes mellitus with foot ulcer L97.511 Non-pressure chronic ulcer of other part of right foot limited to breakdown of  skin E11.51 Type 2 diabetes mellitus with diabetic peripheral angiopathy without gangrene CPT4: [place]- SMOKING CESSATION 3-10 MINS 1 ICD-10 Description Diagnosis E11.621 Type 2 diabetes mellitus with foot ulcer F17.218 Nicotine dependence, cigarettes, with other nicotine-induced disorders Julia, Flowers ([id number]) Electronic Signature(s) Signed: 01/19/2016 2:49:07 PM By: Christin Fudge MD, FACS Entered By: Christin Fudge on 01/19/2016 14:49:07

## 2016-01-19 NOTE — Progress Notes (Signed)
[name], [name] ([id number]) Visit Report for 01/19/2016 Arrival Information Details Patient Name: [name] Date of Service: 01/19/2016 2:15 PM Medical Record Number: [id number] Patient Account Number: [id number] Date of Birth/Sex: 1951-05-08 (65 y.o. Female) Treating RN: [name] Primary Care Physician: Charlott Flowers Other Clinician: Referring Physician: Charlott Flowers Treating Physician/Extender: [name] in Treatment: 2 Visit Information History Since Last Visit All ordered tests and consults were completed: No Patient Arrived: Ambulatory Added or deleted any medications: No Arrival Time: 14:21 Any new allergies or adverse reactions: No Accompanied By: self Had a fall or experienced change in No Transfer Assistance: None activities of daily living that may affect Patient Identification Verified: Yes risk of falls: Secondary Verification Process Yes Signs or symptoms of abuse/neglect since last No Completed: visito Patient Requires Transmission- No Hospitalized since last visit: No Based Precautions: Pain Present Now: No Patient Has Alerts: Yes Patient Alerts: Patient on Blood Thinner 81mg  Aspirin DM II Electronic Signature(s) Signed: 01/19/2016 4:37:03 PM By: [name] Entered By: [name] on 01/19/2016 14:22:42 [name] ([id number]) -------------------------------------------------------------------------------- Clinic Level of Care Assessment Details Patient Name: [name] Date of Service: 01/19/2016 2:15 PM Medical Record Number: [id number] Patient Account Number: [id number] Date of Birth/Sex: 27-Jul-1950 (65 y.o. Female) Treating RN: [name] Primary Care Physician: Charlott Flowers Other Clinician: Referring Physician: Charlott Flowers Treating Physician/Extender: [name] in Treatment: 2 Clinic Level of Care Assessment Items TOOL 4 Quantity Score []  - Use when only an EandM is performed on FOLLOW-UP  visit 0 ASSESSMENTS - Nursing Assessment / Reassessment X - Reassessment of Co-morbidities (includes updates in patient status) 1 10 X - Reassessment of Adherence to Treatment Plan 1 5 ASSESSMENTS - Wound and Skin Assessment / Reassessment X - Simple Wound Assessment / Reassessment - one wound 1 5 []  - Complex Wound Assessment / Reassessment - multiple wounds 0 []  - Dermatologic / Skin Assessment (not related to wound area) 0 ASSESSMENTS - Focused Assessment []  - Circumferential Edema Measurements - multi extremities 0 []  - Nutritional Assessment / Counseling / Intervention 0 X - Lower Extremity Assessment (monofilament, tuning fork, pulses) 1 5 []  - Peripheral Arterial Disease Assessment (using hand held doppler) 0 ASSESSMENTS - Ostomy and/or Continence Assessment and Care []  - Incontinence Assessment and Management 0 []  - Ostomy Care Assessment and Management (repouching, etc.) 0 PROCESS - Coordination of Care X - Simple Patient / Family Education for ongoing care 1 15 []  - Complex (extensive) Patient / Family Education for ongoing care 0 []  - Staff obtains Programmer, systems, Records, Test Results / Process Orders 0 []  - Staff telephones HHA, Nursing Homes / Clarify orders / etc 0 []  - Routine Transfer to another Facility (non-emergent condition) 0 [name] ([id number]) []  - Routine Hospital Admission (non-emergent condition) 0 []  - New Admissions / Biomedical engineer / Ordering NPWT, Apligraf, etc. 0 []  - Emergency Hospital Admission (emergent condition) 0 X - Simple Discharge Coordination 1 10 []  - Complex (extensive) Discharge Coordination 0 PROCESS - Special Needs []  - Pediatric / Minor Patient Management 0 []  - Isolation Patient Management 0 []  - Hearing / Language / Visual special needs 0 []  - Assessment of Community assistance (transportation, D/C planning, etc.) 0 []  - Additional assistance / Altered mentation 0 []  - Support Surface(s) Assessment (bed, cushion, seat,  etc.) 0 INTERVENTIONS - Wound Cleansing / Measurement X - Simple Wound Cleansing - one wound 1 5 []  - Complex Wound Cleansing - multiple wounds 0 X - Wound Imaging (photographs - any  number of wounds) 1 5 []  - Wound Tracing (instead of photographs) 0 X - Simple Wound Measurement - one wound 1 5 []  - Complex Wound Measurement - multiple wounds 0 INTERVENTIONS - Wound Dressings X - Small Wound Dressing one or multiple wounds 1 10 []  - Medium Wound Dressing one or multiple wounds 0 []  - Large Wound Dressing one or multiple wounds 0 []  - Application of Medications - topical 0 []  - Application of Medications - injection 0 INTERVENTIONS - Miscellaneous []  - External ear exam 0 [name] ([id number]) []  - Specimen Collection (cultures, biopsies, blood, body fluids, etc.) 0 []  - Specimen(s) / Culture(s) sent or taken to Lab for analysis 0 []  - Patient Transfer (multiple staff / Harrel Lemon Lift / Similar devices) 0 []  - Simple Staple / Suture removal (25 or less) 0 []  - Complex Staple / Suture removal (26 or more) 0 []  - Hypo / Hyperglycemic Management (close monitor of Blood Glucose) 0 []  - Ankle / Brachial Index (ABI) - do not check if billed separately 0 X - Vital Signs 1 5 Has the patient been seen at the hospital within the last three years: Yes Total Score: 80 Level Of Care: New/Established - Level 3 Electronic Signature(s) Signed: 01/19/2016 4:35:40 PM By: [name] Entered By: [name] on 01/19/2016 14:46:28 [name] ([id number]) -------------------------------------------------------------------------------- Encounter Discharge Information Details Patient Name: [name] Date of Service: 01/19/2016 2:15 PM Medical Record Number: [id number] Patient Account Number: [id number] Date of Birth/Sex: 04-Oct-1950 (65 y.o. Female) Treating RN: [name] Primary Care Physician: Charlott Flowers Other Clinician: Referring Physician: Charlott Flowers Treating  Physician/Extender: [name] in Treatment: 2 Encounter Discharge Information Items Discharge Pain Level: 0 Discharge Condition: Stable Ambulatory Status: Ambulatory Discharge Destination: Home Transportation: Private Auto Accompanied By: self Schedule Follow-up Appointment: Yes Medication Reconciliation completed and provided to Patient/Care Yes Lamanda Rudder: Provided on Clinical Summary of Care: 01/19/2016 Form Type Recipient Paper Patient DS Electronic Signature(s) Signed: 01/19/2016 2:55:45 PM By: Ruthine Dose Entered By: Ruthine Dose on 01/19/2016 14:55:44 Nordell, [name] ([id number]) -------------------------------------------------------------------------------- Lower Extremity Assessment Details Patient Name: [name] Date of Service: 01/19/2016 2:15 PM Medical Record Number: [id number] Patient Account Number: [id number] Date of Birth/Sex: 1950/09/18 (64 y.o. Female) Treating RN: [name] Primary Care Physician: Charlott Flowers Other Clinician: Referring Physician: Charlott Flowers Treating Physician/Extender: [name] in Treatment: 2 Vascular Assessment Pulses: Posterior Tibial Dorsalis Pedis Palpable: [Right:Yes] Extremity colors, hair growth, and conditions: Extremity Color: [Right:Normal] Hair Growth on Extremity: [Right:Yes] Temperature of Extremity: [Right:Warm] Capillary Refill: [Right:< 3 seconds] Electronic Signature(s) Signed: 01/19/2016 4:37:03 PM By: [name] Entered By: [name] on 01/19/2016 14:30:54 Joung, [name] ([id number]) -------------------------------------------------------------------------------- Multi Wound Chart Details Patient Name: [name] Date of Service: 01/19/2016 2:15 PM Medical Record Number: [id number] Patient Account Number: [id number] Date of Birth/Sex: 15-Feb-1951 (65 y.o. Female) Treating RN: [name] Primary Care Physician: Charlott Flowers Other Clinician: Referring  Physician: Charlott Flowers Treating Physician/Extender: [name] in Treatment: 2 Vital Signs Height(in): 66 Pulse(bpm): 99 Weight(lbs): 195 Blood Pressure 66/41 (mmHg): Body Mass Index(BMI): 31 Temperature(F): 98.4 Respiratory Rate 18 (breaths/min): Photos: [1:No Photos] [N/A:N/A] Wound Location: [1:Right Metatarsal head fifth N/A - Lateral] Wounding Event: [1:Pressure Injury] [N/A:N/A] Primary Etiology: [1: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] Secondary Etiology: [1:Pressure Ulcer] [N/A:N/A] Comorbid History: [1:Chronic sinus problems/congestion, Asthma, Hypertension, Type II Diabetes, Neuropathy] [N/A:N/A] Date Acquired: [1:12/11/2015] [N/A:N/A] Weeks of Treatment: [1:2] [N/A:N/A] Wound Status: [1:Open] [N/A:N/A] Measurements L x W x D 0.1x0.1x0.1 [N/A:N/A] (cm) Area (cm) : [  1:0.008] [N/A:N/A] Volume (cm) : [1:0.001] [N/A:N/A] % Reduction in Area: [1:96.20%] [N/A:N/A] % Reduction in Volume: 99.10% [N/A:N/A] Classification: [1:Grade 1] [N/A:N/A] Exudate Amount: [1:Medium] [N/A:N/A] Exudate Type: [1:Serous] [N/A:N/A] Exudate Color: [1:amber] [N/A:N/A] Wound Margin: [1:Flat and Intact] [N/A:N/A] Granulation Amount: [1:None Present (0%)] [N/A:N/A] Necrotic Amount: [1:Large (67-100%)] [N/A:N/A] Necrotic Tissue: [1:Eschar] [N/A:N/A] Exposed Structures: [N/A:N/A] Fascia: No Fat: No Tendon: No Muscle: No Joint: No Bone: No Limited to Skin Breakdown Epithelialization: Small (1-33%) N/A N/A Periwound Skin Texture: Induration: Yes N/A N/A Callus: Yes Edema: No Excoriation: No Crepitus: No Fluctuance: No Friable: No Rash: No Scarring: No Periwound Skin Maceration: No N/A N/A Moisture: Moist: No Dry/Scaly: No Periwound Skin Color: Atrophie Blanche: No N/A N/A Cyanosis: No Ecchymosis: No Erythema: No Hemosiderin Staining: No Mottled: No Pallor: No Rubor: No Temperature: No Abnormality N/A N/A Tenderness on Yes N/A  N/A Palpation: Wound Preparation: Ulcer Cleansing: Other: N/A N/A SOAP AND WATER Topical Anesthetic Applied: Other: LIDOCAINE 4% Treatment Notes Electronic Signature(s) Signed: 01/19/2016 4:37:03 PM By: [name] Entered By: [name] on 01/19/2016 14:35:25 [name] ([id number]) -------------------------------------------------------------------------------- Multi-Disciplinary Care Plan Details Patient Name: [name] Date of Service: 01/19/2016 2:15 PM Medical Record Number: [id number] Patient Account Number: [id number] Date of Birth/Sex: [birth year] (65 y.o. Female) Treating RN: [name] Primary Care Physician: Charlott Flowers Other Clinician: Referring Physician: Charlott Flowers Treating Physician/Extender: [name] in Treatment: 2 Active Inactive Abuse / Safety / Falls / Self Care Management Nursing Diagnoses: Potential for falls Goals: Patient will remain injury free Date Initiated: 12/30/2015 Goal Status: Active Interventions: Assess self care needs on admission and as needed Notes: Nutrition Nursing Diagnoses: Impaired glucose control: actual or potential Goals: Patient/caregiver verbalizes understanding of need to maintain therapeutic glucose control per primary care physician Date Initiated: 12/30/2015 Goal Status: Active Interventions: Assess patient nutrition upon admission and as needed per policy Notes: Orientation to the Wound Care Program Nursing Diagnoses: Knowledge deficit related to the wound healing center program Goals: Patient/caregiver will verbalize understanding of the [place], [place] ([id number]) Date Initiated: 12/30/2015 Goal Status: Active Interventions: Provide education on orientation to the wound center Notes: Pressure Nursing Diagnoses: Knowledge deficit related to management of pressures ulcers Goals: Patient will remain free from development of additional pressure  ulcers Date Initiated: 12/30/2015 Goal Status: Active Patient will remain free of pressure ulcers Date Initiated: 12/30/2015 Goal Status: Active Interventions: Assess offloading mechanisms upon admission and as needed Notes: Wound/Skin Impairment Nursing Diagnoses: Impaired tissue integrity Goals: Patient/caregiver will verbalize understanding of skin care regimen Date Initiated: 12/30/2015 Goal Status: Active Ulcer/skin breakdown will heal within 14 weeks Date Initiated: 12/30/2015 Goal Status: Active Interventions: Assess patient/caregiver ability to obtain necessary supplies Notes: Electronic Signature(s) Signed: 01/19/2016 4:37:03 PM By: [name] Entered By: [name] on 01/19/2016 14:35:19 Tarbell, [name] ([id number]) [name], [name] ([id number]) -------------------------------------------------------------------------------- Pain Assessment Details Patient Name: [name] Date of Service: 01/19/2016 2:15 PM Medical Record Number: [id number] Patient Account Number: [id number] Date of Birth/Sex: [birth year] (65 y.o. Female) Treating RN: [name] Primary Care Physician: Charlott Flowers Other Clinician: Referring Physician: Charlott Flowers Treating Physician/Extender: [name] in Treatment: 2 Active Problems Location of Pain Severity and Description of Pain Patient Has Paino No Site Locations With Dressing Change: No Pain Management and Medication Current Pain Management: Electronic Signature(s) Signed: 01/19/2016 4:37:03 PM By: [name] Entered By: [name] on 01/19/2016 14:22:50 [name] ([id number]) -------------------------------------------------------------------------------- Patient/Caregiver Education Details Patient Name: [name] Date of Service: 01/19/2016 2:15 PM Medical Record Number:  [id number] Patient Account Number: [id number] Date of Birth/Gender: 03-25-1951 (65 y.o. Female) Treating RN:  [name] Primary Care Physician: Charlott Flowers Other Clinician: Referring Physician: Charlott Flowers Treating Physician/Extender: [name] in Treatment: 2 Education Assessment Education Provided To: Patient Education Topics Provided Wound/Skin Impairment: Handouts: Other: change dressing as ordered Methods: Demonstration, Explain/Verbal Responses: State content correctly Electronic Signature(s) Signed: 01/19/2016 4:37:03 PM By: [name] Entered By: [name] on 01/19/2016 14:36:53 Proctor, [name] ([id number]) -------------------------------------------------------------------------------- Wound Assessment Details Patient Name: [name] Date of Service: 01/19/2016 2:15 PM Medical Record Number: [id number] Patient Account Number: [id number] Date of Birth/Sex: 1950/12/30 (65 y.o. Female) Treating RN: [name] Primary Care Physician: Charlott Flowers Other Clinician: Referring Physician: Charlott Flowers Treating Physician/Extender: [name] in Treatment: 2 Wound Status Wound Number: 1 Primary Diabetic Wound/Ulcer of the Lower Etiology: Extremity Wound Location: Right, Lateral Metatarsal head fifth Secondary Pressure Ulcer Etiology: Wounding Event: Pressure Injury Wound Open Date Acquired: 12/11/2015 Status: Weeks Of Treatment: 2 Comorbid Chronic sinus problems/congestion, Clustered Wound: No History: Asthma, Hypertension, Type II Diabetes, Neuropathy Photos Photo Uploaded By: [name] on 01/19/2016 16:35:57 Wound Measurements Length: (cm) 0.2 Width: (cm) 0.2 Depth: (cm) 0.2 Area: (cm) 0.031 Volume: (cm) 0.006 % Reduction in Area: 85.4% % Reduction in Volume: 94.3% Epithelialization: Small (1-33%) Tunneling: No Undermining: No Wound Description Classification: Grade 1 Wound Margin: Flat and Intact Exudate Amount: Medium Exudate Type: Serous Exudate Color: amber Foul Odor After Cleansing: No Wound  Bed Granulation Amount: None Present (0%) Exposed Structure Necrotic Amount: Large (67-100%) Fascia Exposed: No [place], [name] ([id number]) Necrotic Quality: Eschar Fat Layer Exposed: No Tendon Exposed: No Muscle Exposed: No Joint Exposed: No Bone Exposed: No Limited to Skin Breakdown Periwound Skin Texture Texture Color No Abnormalities Noted: No No Abnormalities Noted: No Callus: Yes Atrophie Blanche: No Crepitus: No Cyanosis: No Excoriation: No Ecchymosis: No Fluctuance: No Erythema: No Friable: No Hemosiderin Staining: No Induration: Yes Mottled: No Localized Edema: No Pallor: No Rash: No Rubor: No Scarring: No Temperature / Pain Moisture Temperature: No Abnormality No Abnormalities Noted: No Tenderness on Palpation: Yes Dry / Scaly: No Maceration: No Moist: No Wound Preparation Ulcer Cleansing: Other: SOAP AND WATER, Topical Anesthetic Applied: Other: LIDOCAINE 4%, Treatment Notes Wound #1 (Right, Lateral Metatarsal head fifth) 1. Cleansed with: Clean wound with Normal Saline 4. Dressing Applied: Aquacel Ag 5. Secondary Dressing Applied Dry Gauze Foam Kerlix/Conform 7. Secured with Tape Notes darco with peg assist Electronic Signature(s) Signed: 01/19/2016 4:37:03 PM By: Haywood Lasso, [name] ([id number]) Entered By: [name] on 01/19/2016 14:51:44 [name] ([id number]) -------------------------------------------------------------------------------- [place] Details Patient Name: [name] Date of Service: 01/19/2016 2:15 PM Medical Record Number: [id number] Patient Account Number: [id number] Date of Birth/Sex: 01/11/1951 (65 y.o. Female) Treating RN: [name] Primary Care Physician: Charlott Flowers Other Clinician: Referring Physician: Charlott Flowers Treating Physician/Extender: [name] in Treatment: 2 Vital Signs Time Taken: 14:22 Temperature (F): 98.4 Height (in): 66 Pulse (bpm): 99 Weight  (lbs): 195 Respiratory Rate (breaths/min): 18 Body Mass Index (BMI): 31.5 Blood Pressure (mmHg): 66/41 Reference Range: 80 - 120 mg / dl Notes MD notified immediately about BD see communication notes. Electronic Signature(s) Signed: 01/19/2016 4:37:03 PM By: [name] Entered By: [name] on 01/19/2016 14:30:32

## 2016-01-21 NOTE — Telephone Encounter (Signed)
Reviewed meds w pt

## 2016-01-22 NOTE — Progress Notes (Signed)
Subjective: 65 year old female presents the office today for follow-up evaluation of her right second third digit toenails becoming discolored as well as for right submetatarsal 5 ulceration. She has been going to the wound care center she has gone 2 times. She states that when she goes they trim the callus on the right foot in the area bleeds. She has been doing daily dressing changes at home. She denies any drainage or pus or any surrounding redness or swelling. Denies any systemic complaints such as fevers, chills, nausea, vomiting. No acute changes since last appointment, and no other complaints at this time.   Objective: AAO x3, NAD DP/PT pulses palpable bilaterally, CRT less than 3 seconds Protective sensation decreased with Simms Weinstein monofilament Right second third digit toenails are hypertrophic, dystrophic, discolored. The nail does appear to be firmly adhered and the discoloration appears to be growing out. There is no swelling or redness or any drainage or pus or signs of infection. On the right foot semesters a 5 hyperkeratotic lesion. Upon debrided superficial granular wound is present. There is no probing, undermining or tunneling. There is no surrounding erythema or ascending synovitis. There is no fluctuance or crepitus. There is no malodor. There is no clinical signs of infection. No other open lesions or pre-ulcer lesions identified this time. 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  -Nails debrided 2 without complications or bleeding. The remainder the toenails not elongated and are painful this time. There is no signs of infection. -Hyperkeratotic lesion/ulceration debrided sharply degree no tissue. Continue with Silvadene  dressing changes or as directed by the wound care center. -Continue to monitor for any signs or symptoms of infection to call the office immediately should any occur. -Continued follow-up of the wound care center as well. Follow-up with me as scheduled or sooner if needed. -Patient encouraged to call the office with any questions, concerns, change in symptoms.   Celesta Gentile, DPM

## 2016-01-27 ENCOUNTER — Encounter: Payer: Medicare Other | Admitting: Surgery

## 2016-01-27 DIAGNOSIS — E11621 Type 2 diabetes mellitus with foot ulcer: Secondary | ICD-10-CM | POA: Diagnosis not present

## 2016-01-27 DIAGNOSIS — F17218 Nicotine dependence, cigarettes, with other nicotine-induced disorders: Secondary | ICD-10-CM | POA: Diagnosis not present

## 2016-01-27 DIAGNOSIS — E1140 Type 2 diabetes mellitus with diabetic neuropathy, unspecified: Secondary | ICD-10-CM | POA: Diagnosis not present

## 2016-01-27 DIAGNOSIS — I10 Essential (primary) hypertension: Secondary | ICD-10-CM | POA: Diagnosis not present

## 2016-01-27 DIAGNOSIS — L97511 Non-pressure chronic ulcer of other part of right foot limited to breakdown of skin: Secondary | ICD-10-CM | POA: Diagnosis not present

## 2016-01-27 DIAGNOSIS — E1151 Type 2 diabetes mellitus with diabetic peripheral angiopathy without gangrene: Secondary | ICD-10-CM | POA: Diagnosis not present

## 2016-01-29 NOTE — Progress Notes (Signed)
[name], [name] ([id number]) Visit Report for 01/27/2016 Chief Complaint Document Details Patient Name: [name], [name] 01/27/2016 11:30 Date of Service: AM Medical Record [id number] Number: Patient Account Number: [id number] December 16, 1950 (65 y.o. Treating RN: Cornell Barman Date of Birth/Sex: Female) Other Clinician: Primary Care Physician: Charlott Rakes Treating Christin Fudge Referring Physician: Charlott Rakes Physician/Extender: Suella Grove in Treatment: 4 Information Obtained from: Patient Chief Complaint Patient is here for a wound over her right fifth metatarsal head Electronic Signature(s) Signed: 01/27/2016 11:51:09 AM By: Christin Fudge MD, FACS Entered By: Christin Fudge on 01/27/2016 11:51:09 [name] ([id number]) -------------------------------------------------------------------------------- Debridement Details Patient Name: [name], [name] 01/27/2016 11:30 Date of Service: AM Medical Record [id number] Number: Patient Account Number: [id number] 05/06/1951 (64 y.o. Treating RN: Cornell Barman Date of Birth/Sex: Female) Other Clinician: Primary Care Physician: Charlott Rakes Treating Myquan Schaumburg Referring Physician: Charlott Rakes Physician/Extender: Suella Grove in Treatment: 4 Debridement Performed for Wound #1 Right,Lateral Metatarsal head fifth Assessment: Performed By: Physician Christin Fudge, MD Debridement: Debridement Pre-procedure Yes Verification/Time Out Taken: Start Time: 11:35 Pain Control: Other : lidocaine 4% Level: Skin/Subcutaneous Tissue Total Area Debrided (L x 0.5 (cm) x 0.8 (cm) = 0.4 (cm) W): Tissue and other Viable, Non-Viable, Callus, Exudate, Fibrin/Slough, Skin, Subcutaneous material debrided: Instrument: Forceps, Scissors Bleeding: Minimum Hemostasis Achieved: Pressure End Time: 11:45 Procedural Pain: 0 Post Procedural Pain: 0 Response to Treatment: Procedure was tolerated well Post Debridement Measurements of Total Wound Length: (cm)  0.5 Width: (cm) 0.8 Depth: (cm) 0.2 Volume: (cm) 0.063 Post Procedure Diagnosis Same as Pre-procedure Electronic Signature(s) Signed: 01/27/2016 11:51:00 AM By: Christin Fudge MD, FACS Signed: 01/28/2016 5:06:33 PM By: Gretta Cool RN, BSN, Kim RN, BSN Entered By: Christin Fudge on 01/27/2016 11:51:00 [name] ([id number]) -------------------------------------------------------------------------------- HPI Details Patient Name: [name], [name] 01/27/2016 11:30 Date of Service: AM Medical Record [id number] Number: Patient Account Number: [id number] [birth year] (65 y.o. Treating RN: Cornell Barman Date of Birth/Sex: Female) Other Clinician: Primary Care Physician: Charlott Rakes Treating Christin Fudge Referring Physician: Charlott Rakes Physician/Extender: Suella Grove in Treatment: 4 History of Present Illness HPI Description: 12/30/15; this is a 65 year old type II diabetic with diabetic neuropathy. 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 She also has a known history of PAD with arterial studies from November 2016 showing reasonably normal ABIs 911 on the right and 1.14 on the left but quite produced T ABIs at 0.45 on the right and 0.29 on the left. She also is a continued smoker. She was recently in the emergency room with drainage coming out of a foot ulcer over the right fifth metatarsal head that is come on over the last 2 weeks. She tells me it took an awful long time for her to get diabetic shoes in spite of the amputation of the right first metatarsal ray in the fall. She only got the shoes a month ago. Apparently she had her inserts adjusted by Dr. Jacqualyn Posey of podiatric surgery who is been following her for routine foot care. Emergency department she had a negative x-ray for osteomyelitis and was given 2 weeks worth of doxycycline. This started on 12/25/15. On that trip to the emergency room she was noted to have a 1 cm slough  area on the ventral surface of her right foot at the base of the fifth digit mild warmth and swelling were noted there was no streaking and no purulence. She was discharged on doxycycline 01/06/16; the patient states that the healing sandal straps irritate at the  top of her foot therefore she has been using her own sandals. She is been on her feet quite a bit. 01/19/2016 -- the patient has had a gastroenteritis and today though she feels well she says she has not brought any one with her to drive and hence we will not be able to put a TCC on her right foot. 01/27/2016 -- she tells me that her left knee is very bad and buckles quite often and she is rather unsteady on her feet. It may be a bad idea to put her in a total contact cast on her right foot. Electronic Signature(s) Signed: 01/27/2016 11:51:47 AM By: Christin Fudge MD, FACS Entered By: Christin Fudge on 01/27/2016 11:51:46 [name] ([id number]) -------------------------------------------------------------------------------- Physical Exam Details Patient Name: [name], [name] 01/27/2016 11:30 Date of Service: AM Medical Record [id number] Number: Patient Account Number: [id number] 15-Apr-1951 (65 y.o. Treating RN: Cornell Barman Date of Birth/Sex: Female) Other Clinician: Primary Care Physician: Charlott Rakes Treating Christin Fudge Referring Physician: Charlott Rakes Physician/Extender: Weeks in Treatment: 4 Constitutional . Pulse regular. Respirations normal and unlabored. Afebrile. . Eyes Nonicteric. Reactive to light. Ears, Nose, Mouth, and Throat Lips, teeth, and gums WNL.Marland Kitchen Moist mucosa without lesions. Neck supple and nontender. No palpable supraclavicular or cervical adenopathy. Normal sized without goiter. Respiratory WNL. No retractions.. Breath sounds WNL, No rubs, rales, rhonchi, or wheeze.. Cardiovascular Heart rhythm and rate regular, no murmur or gallop.. Pedal Pulses WNL. No clubbing, cyanosis or  edema. Lymphatic No adneopathy. No adenopathy. No adenopathy. Musculoskeletal Adexa without tenderness or enlargement.. Digits and nails w/o clubbing, cyanosis, infection, petechiae, ischemia, or inflammatory conditions.. Integumentary (Hair, Skin) No suspicious lesions. No crepitus or fluctuance. No peri-wound warmth or erythema. No masses.Marland Kitchen Psychiatric Judgement and insight Intact.. No evidence of depression, anxiety, or agitation.. Notes the wound on the right lateral forefoot plantar aspect had significant callus which are sharply removed with a forcep and scissors to reveal a shallow ulcer which has been appropriately debrided. She has a superficial skin tear medial to this which she says she tried to pull off some skin Electronic Signature(s) Signed: 01/27/2016 11:52:46 AM By: Christin Fudge MD, FACS Entered By: Christin Fudge on 01/27/2016 11:52:45 [name] ([id number]) -------------------------------------------------------------------------------- Physician Orders Details Patient Name: [name], [name] 01/27/2016 11:30 Date of Service: AM Medical Record [id number] Number: Patient Account Number: [id number] [birth year] (65 y.o. Treating RN: Cornell Barman Date of Birth/Sex: Female) Other Clinician: Primary Care Physician: Charlott Rakes Treating Christin Fudge Referring Physician: Charlott Rakes Physician/Extender: Suella Grove in Treatment: 4 Verbal / Phone Orders: Yes Clinician: Cornell Barman Read Back and Verified: Yes Diagnosis Coding Wound Cleansing Wound #1 Right,Lateral Metatarsal head fifth o Clean wound with Normal Saline. Anesthetic Wound #1 Right,Lateral Metatarsal head fifth o Topical Lidocaine 4% cream applied to wound bed prior to debridement Primary Wound Dressing Wound #1 Right,Lateral Metatarsal head fifth o Aquacel Ag Secondary Dressing Wound #1 Right,Lateral Metatarsal head fifth o Dry Gauze - felt for offloading Dressing Change Frequency Wound #1  Right,Lateral Metatarsal head fifth o Change dressing every other day. Follow-up Appointments Wound #1 Right,Lateral Metatarsal head fifth o Return Appointment in 1 week. Edema Control o Elevate legs to the level of the heart and pump ankles as often as possible Off-Loading Wound #1 Right,Lateral Metatarsal head fifth o Open toe surgical shoe to: - Right with peg assist Additional Orders / Instructions Wound #1 Right,Lateral Metatarsal head fifth [name], [name] ([id number]) o Stop Smoking o Increase protein intake. Electronic Signature(s) Signed: 01/27/2016 11:54:41 AM By: Christin Fudge  MD, FACS Signed: 01/28/2016 5:06:33 PM By: Gretta Cool, RN, BSN, Kim RN, BSN Entered By: Gretta Cool, RN, BSN, Kim on 01/27/2016 11:45:59 [name] ([id number]) -------------------------------------------------------------------------------- Problem List Details Patient Name: [name], [name] 01/27/2016 11:30 Date of Service: AM Medical Record [id number] Number: Patient Account Number: [id number] Nov 05, 1950 (65 y.o. 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 E11.51 Type 2 diabetes mellitus with diabetic peripheral 12/30/2015 Yes angiopathy without gangrene F17.218 Nicotine dependence, cigarettes, with other nicotine- 01/19/2016 Yes induced disorders Inactive Problems Resolved Problems Electronic Signature(s) Signed: 01/27/2016 11:50:50 AM By: Christin Fudge MD, FACS Entered By: Christin Fudge on 01/27/2016 11:50:50 [name]  ([id number]) -------------------------------------------------------------------------------- Progress Note Details Patient Name: [name], [name] 01/27/2016 11:30 Date of Service: AM Medical Record [id number] Number: Patient Account Number: [id number] 1950/11/24 (65 y.o. Treating RN: Cornell Barman Date of Birth/Sex: Female) Other Clinician: Primary Care Physician: Charlott Rakes Treating Christin Fudge Referring Physician: Charlott Rakes Physician/Extender: Suella Grove in Treatment: 4 Subjective Chief Complaint Information obtained from Patient Patient is here for a wound over her right fifth metatarsal head History of Present Illness (HPI) 12/30/15; this is a 65 year old type II diabetic with diabetic neuropathy. 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 She also has a known history of PAD with arterial studies from November 2016 showing reasonably normal ABIs 911 on the right and 1.14 on the left but quite produced T ABIs at 0.45 on the right and 0.29 on the left. She also is a continued smoker. She was recently in the emergency room with drainage coming out of a foot ulcer over the right fifth metatarsal head that is come on over the last 2 weeks. She tells me it took an awful long time for her to get diabetic shoes in spite of the amputation of the right first metatarsal ray in the fall. She only got the shoes a month ago. Apparently she had her inserts adjusted by Dr. Jacqualyn Posey of podiatric surgery who is been following her for routine foot care. Emergency department she had a negative x-ray for osteomyelitis and was given 2 weeks worth of doxycycline. This started on 12/25/15. On that trip to the emergency room she was noted to have a 1 cm slough area on the ventral surface of her right foot at the base of the fifth digit mild warmth and swelling were noted there was no streaking and no purulence. She was discharged  on doxycycline 01/06/16; the patient states that the healing sandal straps irritate at the top of her foot therefore she has been using her own sandals. She is been on her feet quite a bit. 01/19/2016 -- the patient has had a gastroenteritis and today though she feels well she says she has not brought any one with her to drive and hence we will not be able to put a TCC on her right foot. 01/27/2016 -- she tells me that her left knee is very bad and buckles quite often and she is rather unsteady on her feet. It may be a bad idea to put her in a total contact cast on her right foot. Objective Constitutional [name], [name] ([id number]) Pulse  regular. Respirations normal and unlabored. Afebrile. Vitals Time Taken: 11:18 AM, Height: 66 in, Weight: 195 lbs, BMI: 31.5, Temperature: 98.5 F, Pulse: 81 bpm, Respiratory Rate: 18 breaths/min, Blood Pressure: 109/55 mmHg. Eyes Nonicteric. Reactive to light. Ears, Nose, Mouth, and Throat Lips, teeth, and gums WNL.Marland Kitchen Moist mucosa without lesions. Neck supple and nontender. No palpable supraclavicular or cervical adenopathy. Normal sized without goiter. Respiratory WNL. No retractions.. Breath sounds WNL, No rubs, rales, rhonchi, or wheeze.. Cardiovascular Heart rhythm and rate regular, no murmur or gallop.. Pedal Pulses WNL. No clubbing, cyanosis or edema. Lymphatic No adneopathy. No adenopathy. No adenopathy. Musculoskeletal Adexa without tenderness or enlargement.. Digits and nails w/o clubbing, cyanosis, infection, petechiae, ischemia, or inflammatory conditions.Marland Kitchen Psychiatric Judgement and insight Intact.. No evidence of depression, anxiety, or agitation.. General Notes: the wound on the right lateral forefoot plantar aspect had significant callus which are sharply removed with a forcep and scissors to reveal a shallow ulcer which has been appropriately debrided. She has a superficial skin tear medial to this which she says she tried to pull off  some skin Integumentary (Hair, Skin) No suspicious lesions. No crepitus or fluctuance. No peri-wound warmth or erythema. No masses.. Wound #1 status is Open. Original cause of wound was Pressure Injury. The wound is located on the Right,Lateral Metatarsal head fifth. The wound measures 0.1cm length x 0.1cm width x 0.1cm depth; 0.008cm^2 area and 0.001cm^3 volume. The wound is limited to skin breakdown. There is a medium amount of serous drainage noted. The wound margin is flat and intact. There is no granulation within the wound bed. There is a large (67-100%) amount of necrotic tissue within the wound bed including Eschar. The periwound skin appearance exhibited: Callus, Induration. The periwound skin appearance did not exhibit: Crepitus, Excoriation, Fluctuance, Friable, Localized Edema, Rash, Scarring, Dry/Scaly, Maceration, Moist, Atrophie Blanche, Cyanosis, Ecchymosis, Hemosiderin Staining, Mottled, Pallor, Rubor, Erythema. Periwound temperature was noted as No Abnormality. The periwound has tenderness on palpation. Wound #2 status is Open. Original cause of wound was Trauma. The wound is located on the [place], [place] ([id number]) Right,Proximal Metatarsal head fifth. The wound measures 0.5cm length x 0.8cm width x 0.1cm depth; 0.314cm^2 area and 0.031cm^3 volume. The wound is limited to skin breakdown. There is no tunneling or undermining noted. There is a small amount of sanguinous drainage noted. The wound margin is flat and intact. There is no granulation within the wound bed. There is no necrotic tissue within the wound bed. The periwound skin appearance exhibited: Moist. The periwound skin appearance did not exhibit: Callus, Crepitus, Excoriation, Fluctuance, Friable, Induration, Localized Edema, Rash, Scarring, Dry/Scaly, Maceration, Atrophie Blanche, Cyanosis, Ecchymosis, Hemosiderin Staining, Mottled, Pallor, Rubor, Erythema. 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 There was a Skin/Subcutaneous Tissue Debridement [id number]) debridement with total area of 0.4 sq cm performed by Christin Fudge, MD. with the following instrument(s): Forceps and Scissors to remove Viable and Non-Viable tissue/material including Exudate, Fibrin/Slough, Skin, Callus, and Subcutaneous after achieving pain control using Other (lidocaine 4%). A time out was conducted prior to the start of the procedure. A Minimum amount of bleeding was controlled with Pressure. The  procedure was tolerated well with a pain level of 0 throughout and a pain level of 0 following the procedure. Post Debridement Measurements: 0.5cm length x 0.8cm width x 0.2cm depth; 0.063cm^3 volume. Post procedure Diagnosis Wound #1: Same as Pre-Procedure Plan Wound Cleansing: Wound #1 Right,Lateral Metatarsal head fifth: [name], [name] ([id number]) Clean wound with Normal Saline. Anesthetic: Wound #1 Right,Lateral Metatarsal head fifth: Topical Lidocaine 4% cream applied to wound bed prior to debridement Primary Wound Dressing: Wound #1 Right,Lateral Metatarsal head fifth: Aquacel Ag Secondary Dressing: Wound #1 Right,Lateral Metatarsal head fifth: Dry Gauze - felt for offloading Dressing Change Frequency: Wound #1 Right,Lateral Metatarsal head fifth: Change dressing every other day. Follow-up Appointments: Wound #1 Right,Lateral Metatarsal head fifth: Return Appointment in 1 week. Edema Control: Elevate legs to the level of the heart and pump ankles as often as  possible Off-Loading: Wound #1 Right,Lateral Metatarsal head fifth: Open toe surgical shoe to: - Right with peg assist Additional Orders / Instructions: Wound #1 Right,Lateral Metatarsal head fifth: Stop Smoking Increase protein intake. I have opted not to place a total contact cast on her right foot, due to the inability of her left knee which buckles quite often Local care will be continued with Aquacel Ag, and an offloading Pegasys tissue I have again urged her to completely give up smoking and I have strongly advised her to do this in view of her diabetes mellitus with peripheral angiopathy. Electronic Signature(s) Signed: 01/27/2016 11:54:02 AM By: Christin Fudge MD, FACS Entered By: Christin Fudge on 01/27/2016 11:54:02 [name] ([id number]) -------------------------------------------------------------------------------- SuperBill Details Patient Name: [name] Date of Service: 01/27/2016 Medical Record Number: [id number] Patient Account Number: [id number] Date of Birth/Sex: 1951/02/28 (64 y.o. Female) Treating RN: Cornell Barman Primary Care Physician: Charlott Rakes Other Clinician: Referring Physician: Charlott Rakes Treating Physician/Extender: [name] in Treatment: 4 Diagnosis Coding ICD-10 Codes Code Description E11.621 Type 2 diabetes mellitus with foot ulcer L97.511 Non-pressure chronic ulcer of other part of right foot limited to breakdown of skin E11.51 Type 2 diabetes mellitus with diabetic peripheral angiopathy without gangrene F17.218 Nicotine dependence, cigarettes, with other nicotine-induced disorders Facility Procedures CPT4: Description Modifier Quantity Code [id number] - DEB SUBQ TISSUE 20 SQ CM/< 1 ICD-10 Description Diagnosis E11.621 Type 2 diabetes mellitus with foot ulcer L97.511 Non-pressure chronic ulcer of other part of right foot limited to breakdown of  skin E11.51 Type 2 diabetes mellitus with diabetic peripheral angiopathy  without gangrene Physician Procedures CPT4: Description Modifier Quantity Code [id number] - WC PHYS SUBQ TISS 20 SQ CM 1 ICD-10 Description Diagnosis E11.621 Type 2 diabetes mellitus with foot ulcer L97.511 Non-pressure chronic ulcer of other part of right foot limited to breakdown of  skin E11.51 Type 2 diabetes mellitus with diabetic peripheral angiopathy without gangrene Electronic Signature(s) Signed: 01/27/2016 11:54:19 AM By: Christin Fudge MD, FACS [place], [place] ([id number]) Entered By: Christin Fudge on 01/27/2016 11:54:18

## 2016-01-29 NOTE — Progress Notes (Signed)
MAVERICK, LEBRETON ([id number]) Visit Report for 01/27/2016 Arrival Information Details Patient Name: [name], [name] Date of Service: 01/27/2016 11:30 AM Medical Record Number: [id number] Patient Account Number: [id number] Date of Birth/Sex: 09/20/1950 (65 y.o. Female) Treating RN: Cornell Barman Primary Care Physician: Charlott Rakes Other Clinician: Referring Physician: Charlott Rakes Treating Physician/Extender: Frann Rider in Treatment: 4 Visit Information History Since Last Visit Added or deleted any medications: No Patient Arrived: Ambulatory Any new allergies or adverse reactions: No Arrival Time: 11:18 Had a fall or experienced change in No Accompanied By: self activities of daily living that may affect Transfer Assistance: None risk of falls: Patient Identification Verified: Yes Signs or symptoms of abuse/neglect since last No Secondary Verification Process Yes visito Completed: Hospitalized since last visit: No Patient Requires Transmission- No Has Dressing in Place as Prescribed: Yes Based Precautions: Pain Present Now: No Patient Has Alerts: Yes Patient Alerts: Patient on Blood Thinner 81mg  Aspirin DM II Electronic Signature(s) Signed: 01/28/2016 5:06:33 PM By: Gretta Cool, RN, BSN, Kim RN, BSN Entered By: Gretta Cool, RN, BSN, Kim on 01/27/2016 11:18:16 [name] ([id number]) -------------------------------------------------------------------------------- Encounter Discharge Information Details Patient Name: [name] Date of Service: 01/27/2016 11:30 AM Medical Record Number: [id number] Patient Account Number: [id number] Date of Birth/Sex: 12/04/1950 (65 y.o. Female) Treating RN: Cornell Barman Primary Care Physician: Charlott Rakes Other Clinician: Referring Physician: Charlott Rakes Treating Physician/Extender: Frann Rider in Treatment: 4 Encounter Discharge Information Items Discharge Pain Level: 0 Discharge Condition: Stable Ambulatory Status:  Ambulatory Discharge Destination: Home Transportation: Private Auto Accompanied By: daughter Schedule Follow-up Appointment: Yes Medication Reconciliation completed and provided to Patient/Care Yes Teresia Myint: Provided on Clinical Summary of Care: 01/27/2016 Form Type Recipient Paper Patient DS Electronic Signature(s) Signed: 01/28/2016 5:06:33 PM By: Gretta Cool RN, BSN, Kim RN, BSN Previous Signature: 01/27/2016 11:50:30 AM Version By: Ruthine Dose Entered By: Gretta Cool RN, BSN, Kim on 01/27/2016 12:21:30 [name] ([id number]) -------------------------------------------------------------------------------- Lower Extremity Assessment Details Patient Name: [name] Date of Service: 01/27/2016 11:30 AM Medical Record Number: [id number] Patient Account Number: [id number] Date of Birth/Sex: [birth year] (65 y.o. Female) Treating RN: Cornell Barman Primary Care Physician: Charlott Rakes Other Clinician: Referring Physician: Charlott Rakes Treating Physician/Extender: Frann Rider in Treatment: 4 Vascular Assessment Pulses: Posterior Tibial Dorsalis Pedis Palpable: [Right:Yes] Extremity colors, hair growth, and conditions: Extremity Color: [Right:Normal] Hair Growth on Extremity: [Right:Yes] Temperature of Extremity: [Right:Warm] Capillary Refill: [Right:< 3 seconds] Toe Nail Assessment Left: Right: Thick: No Discolored: No Deformed: No Improper Length and Hygiene: No Electronic Signature(s) Signed: 01/28/2016 5:06:33 PM By: Gretta Cool, RN, BSN, Kim RN, BSN Entered By: Gretta Cool, RN, BSN, Kim on 01/27/2016 11:23:05 [name] ([id number]) -------------------------------------------------------------------------------- Multi Wound Chart Details Patient Name: [name] Date of Service: 01/27/2016 11:30 AM Medical Record Number: [id number] Patient Account Number: [id number] Date of Birth/Sex: 04-13-1951 (65 y.o. Female) Treating RN: Cornell Barman Primary Care Physician:  Charlott Rakes Other Clinician: Referring Physician: Charlott Rakes Treating Physician/Extender: Frann Rider in Treatment: 4 Vital Signs Height(in): 66 Pulse(bpm): 81 Weight(lbs): 195 Blood Pressure 109/55 (mmHg): Body Mass Index(BMI): 31 Temperature(F): 98.5 Respiratory Rate 18 (breaths/min): Photos: [N/A:N/A] Wound Location: Right Metatarsal head fifth Right Metatarsal head fifth N/A - Lateral - Proximal Wounding Event: Pressure Injury Trauma N/A Primary Etiology: Diabetic Wound/Ulcer of Trauma, Other N/A the Lower Extremity Secondary Etiology: Pressure Ulcer 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 Comorbid History: Chronic sinus Chronic sinus N/A problems/congestion, problems/congestion, Asthma, Hypertension, Asthma, Hypertension, Type II Diabetes, Type II Diabetes, Neuropathy Neuropathy Date Acquired: 12/11/2015 01/27/2016 N/A Weeks of Treatment: 4  0 N/A Wound Status: Open Open N/A Measurements L x W x D 0.1x0.1x0.1 0.5x0.8x0.1 N/A (cm) Area (cm) : 0.008 0.314 N/A Volume (cm) : 0.001 0.031 N/A % Reduction in Area: 96.20% N/A N/A % Reduction in Volume: 99.10% N/A N/A Classification: Grade 1 Partial Thickness N/A HBO Classification: N/A Grade 1 N/A Flowers, Julia ([id number]) Exudate Amount: Medium Small N/A Exudate Type: Serous Sanguinous N/A Exudate Color: amber red N/A Wound Margin: Flat and Intact Flat and Intact N/A Granulation Amount: None Present (0%) None Present (0%) N/A Necrotic Amount: Large (67-100%) None Present (0%) N/A Necrotic Tissue: Eschar N/A N/A Exposed Structures: Fascia: No Fascia: No N/A Fat: No Fat: No Tendon: No Tendon: No Muscle: No Muscle: No Joint: No Joint: No Bone: No Bone: No Limited to Skin Limited to Skin Breakdown Breakdown Epithelialization: Small (1-33%) None N/A Periwound Skin Texture: Induration: Yes Edema: No N/A Callus: Yes Excoriation: No Edema: No Induration: No Excoriation: No Callus:  No Crepitus: No Crepitus: No Fluctuance: No Fluctuance: No Friable: No Friable: No Rash: No Rash: No Scarring: No Scarring: No Periwound Skin Maceration: No Moist: Yes N/A Moisture: Moist: No Maceration: No Dry/Scaly: No Dry/Scaly: No Periwound Skin Color: Atrophie Blanche: No Atrophie Blanche: No N/A Cyanosis: No Cyanosis: No Ecchymosis: No Ecchymosis: No Erythema: No Erythema: No Hemosiderin Staining: No Hemosiderin Staining: No Mottled: No Mottled: No Pallor: No Pallor: No Rubor: No Rubor: No Temperature: No Abnormality N/A N/A Tenderness on Yes No N/A Palpation: Wound Preparation: Ulcer Cleansing: Ulcer Cleansing: N/A Rinsed/Irrigated with Rinsed/Irrigated with Saline Saline Topical Anesthetic Topical Anesthetic Applied: Other: Applied: None LIDOCAINE 4% Treatment Notes CRISTI, CARNLEY ([id number]) Electronic Signature(s) Signed: 01/28/2016 5:06:33 PM By: Gretta Cool, RN, BSN, Kim RN, BSN Entered By: Gretta Cool, RN, BSN, Kim on 01/27/2016 11:43:26 [name] ([id number]) -------------------------------------------------------------------------------- Multi-Disciplinary Care Plan Details Patient Name: [name] Date of Service: 01/27/2016 11:30 AM Medical Record Number: [id number] Patient Account Number: [id number] Date of Birth/Sex: August 14, 1950 (65 y.o. Female) Treating RN: Cornell Barman Primary Care Physician: Charlott Rakes Other Clinician: Referring Physician: Charlott Rakes Treating Physician/Extender: Frann Rider in Treatment: 4 Active Inactive Abuse / Safety / Falls / Self Care Management Nursing Diagnoses: Potential for falls Goals: Patient will remain injury free Date Initiated: 12/30/2015 Goal Status: Active Interventions: Assess self care needs on admission and as needed Notes: Nutrition Nursing Diagnoses: Impaired glucose control: actual or potential Goals: Patient/caregiver verbalizes understanding of need to maintain  therapeutic glucose control per primary care physician Date Initiated: 12/30/2015 Goal Status: Active Interventions: Assess patient nutrition upon admission and as needed per policy Notes: Orientation to the Wound Care Program Nursing Diagnoses: Knowledge deficit related to the wound healing center program Goals: Patient/caregiver will verbalize understanding of the [facility], [place] ([id number]) Date Initiated: 12/30/2015 Goal Status: Active Interventions: Provide education on orientation to the wound center Notes: Pressure Nursing Diagnoses: Knowledge deficit related to management of pressures ulcers Goals: Patient will remain free from development of additional pressure ulcers Date Initiated: 12/30/2015 Goal Status: Active Patient will remain free of pressure ulcers Date Initiated: 12/30/2015 Goal Status: Active Interventions: Assess offloading mechanisms upon admission and as needed Notes: Wound/Skin Impairment Nursing Diagnoses: Impaired tissue integrity Goals: Patient/caregiver will verbalize understanding of skin care regimen Date Initiated: 12/30/2015 Goal Status: Active Ulcer/skin breakdown will heal within 14 weeks Date Initiated: 12/30/2015 Goal Status: Active Interventions: Assess patient/caregiver ability to obtain necessary supplies Notes: Electronic Signature(s) Signed: 01/28/2016 5:06:33 PM By: Gretta Cool, RN, BSN, Kim RN, BSN Entered By: Gretta Cool, RN, BSN,  Kim on 01/27/2016 11:33:30 Jinkins, Eternity ([id number], [name] ([id number]) -------------------------------------------------------------------------------- Pain Assessment Details Patient Name: GOLDENA, CUTRI Date of Service: 01/27/2016 11:30 AM Medical Record Number: [id number] Patient Account Number: [id number] Date of Birth/Sex: 1951/01/30 (65 y.o. Female) Treating RN: Cornell Barman Primary Care Physician: Charlott Rakes Other Clinician: Referring Physician: Charlott Rakes Treating Physician/Extender: Frann Rider in Treatment: 4 Active Problems Location of Pain Severity and Description of Pain Patient Has Paino No Site Locations With Dressing Change: No Pain Management and Medication Current Pain Management: Electronic Signature(s) Signed: 01/28/2016 5:06:33 PM By: Gretta Cool, RN, BSN, Kim RN, BSN Entered By: Gretta Cool, RN, BSN, Kim on 01/27/2016 11:18:24 [name] ([id number]) -------------------------------------------------------------------------------- Patient/Caregiver Education Details Patient Name: [name] Date of Service: 01/27/2016 11:30 AM Medical Record Number: [id number] Patient Account Number: [id number] Date of Birth/Gender: December 19, 1950 (65 y.o. Female) Treating RN: Cornell Barman Primary Care Physician: Charlott Rakes Other Clinician: Referring Physician: Charlott Rakes Treating Physician/Extender: Frann Rider in Treatment: 4 Education Assessment Education Provided To: Caregiver Education Topics Provided Wound/Skin Impairment: Caring for Your Ulcer, Other: wash feet with soap and water, continue wound care as Handouts: prescribed Methods: Demonstration Responses: State content correctly Electronic Signature(s) Signed: 01/28/2016 5:06:33 PM By: Gretta Cool, RN, BSN, Kim RN, BSN Entered By: Gretta Cool, RN, BSN, Kim on 01/27/2016 12:22:10 [name] ([id number]) -------------------------------------------------------------------------------- Wound Assessment Details Patient Name: [name] Date of Service: 01/27/2016 11:30 AM Medical Record Number: [id number] Patient Account Number: [id number] Date of Birth/Sex: 17-May-1951 (65 y.o. Female) Treating RN: Cornell Barman Primary Care Physician: Charlott Rakes Other Clinician: Referring Physician: Charlott Rakes Treating Physician/Extender: Frann Rider in Treatment: 4 Wound Status Wound Number: 1 Primary Diabetic Wound/Ulcer of the Lower Etiology:  Extremity Wound Location: Right Metatarsal head fifth - Lateral Secondary Pressure Ulcer Etiology: Wounding Event: Pressure Injury Wound Open Date Acquired: 12/11/2015 Status: Weeks Of Treatment: 4 Comorbid Chronic sinus problems/congestion, Clustered Wound: No History: Asthma, Hypertension, Type II Diabetes, Neuropathy Photos Wound Measurements Length: (cm) 0.1 Width: (cm) 0.1 Depth: (cm) 0.1 Area: (cm) 0.008 Volume: (cm) 0.001 % Reduction in Area: 96.2% % Reduction in Volume: 99.1% Epithelialization: Small (1-33%) Wound Description Classification: Grade 1 Wound Margin: Flat and Intact Exudate Amount: Medium Exudate Type: Serous Exudate Color: amber Foul Odor After Cleansing: No Wound Bed Granulation Amount: None Present (0%) Exposed Structure Necrotic Amount: Large (67-100%) Fascia Exposed: No Necrotic Quality: Eschar Fat Layer Exposed: No Decarlo, Kinda ([id number]) Tendon Exposed: No Muscle Exposed: No Joint Exposed: No Bone Exposed: No Limited to Skin Breakdown Periwound Skin Texture Texture Color No Abnormalities Noted: No No Abnormalities Noted: No Callus: Yes Atrophie Blanche: No Crepitus: No Cyanosis: No Excoriation: No Ecchymosis: No Fluctuance: No Erythema: No Friable: No Hemosiderin Staining: No Induration: Yes Mottled: No Localized Edema: No Pallor: No Rash: No Rubor: No Scarring: No Temperature / Pain Moisture Temperature: No Abnormality No Abnormalities Noted: No Tenderness on Palpation: Yes Dry / Scaly: No Maceration: No Moist: No Wound Preparation Ulcer Cleansing: Rinsed/Irrigated with Saline Topical Anesthetic Applied: Other: LIDOCAINE 4%, Treatment Notes Wound #1 (Right, Lateral Metatarsal head fifth) 1. Cleansed with: Clean wound with Normal Saline 4. Dressing Applied: Aquacel Ag 5. Secondary Dressing Applied Dry Gauze Foam Kerlix/Conform 7. Secured with Tape Notes darco with peg assist Electronic  Signature(s) Signed: 01/28/2016 5:06:33 PM By: Gretta Cool, RN, BSN, Kim RN, BSN Entered By: Gretta Cool, RN, BSN, Kim on 01/27/2016 11:32:29 ZAYA, SHIPLEY ([id number]) Radley, [name] ([id number]) -------------------------------------------------------------------------------- Wound Assessment Details Patient Name: [name] Date of Service: 01/27/2016 11:30 AM Medical Record Number:  [id number] Patient Account Number: [id number] Date of Birth/Sex: 19-Jun-1950 (65 y.o. Female) Treating RN: Cornell Barman Primary Care Physician: Charlott Rakes Other Clinician: Referring Physician: Charlott Rakes Treating Physician/Extender: Frann Rider in Treatment: 4 Wound Status Wound Number: 2 Primary Trauma, Other Etiology: Wound Location: Right Metatarsal head fifth - Proximal Secondary Diabetic Wound/Ulcer of the Lower Etiology: Extremity Wounding Event: Trauma Wound Open Date Acquired: 01/27/2016 Status: Weeks Of Treatment: 0 Comorbid Chronic sinus problems/congestion, Clustered Wound: No History: Asthma, Hypertension, Type II Diabetes, Neuropathy Photos Wound Measurements Length: (cm) 0.5 Width: (cm) 0.8 Depth: (cm) 0.1 Area: (cm) 0.314 Volume: (cm) 0.031 % Reduction in Area: % Reduction in Volume: Epithelialization: None Tunneling: No Undermining: No Wound Description Classification: Partial Thickness Foul Odor A Diabetic Severity (Wagner): Grade 1 Wound Margin: Flat and Intact Exudate Amount: Small Exudate Type: Sanguinous Exudate Color: red fter Cleansing: No Wound Bed Granulation Amount: None Present (0%) Exposed Structure Necrotic Amount: None Present (0%) Fascia Exposed: No Arentz, Brandee ([id number]) Fat Layer Exposed: No Tendon Exposed: No Muscle Exposed: No Joint Exposed: No Bone Exposed: No Limited to Skin Breakdown Periwound Skin Texture Texture Color No Abnormalities Noted: No No Abnormalities Noted: No Callus: No Atrophie Blanche: No Crepitus:  No Cyanosis: No Excoriation: No Ecchymosis: No Fluctuance: No Erythema: No Friable: No Hemosiderin Staining: No Induration: No Mottled: No Localized Edema: No Pallor: No Rash: No Rubor: No Scarring: No Moisture No Abnormalities Noted: No Dry / Scaly: No Maceration: No Moist: Yes Wound Preparation Ulcer Cleansing: Rinsed/Irrigated with Saline Topical Anesthetic Applied: None Treatment Notes Wound #2 (Right, Proximal Metatarsal head fifth) 1. Cleansed with: Clean wound with Normal Saline 4. Dressing Applied: Aquacel Ag 5. Secondary Dressing Applied Dry Gauze Foam Kerlix/Conform 7. Secured with Tape Notes darco with peg assist Electronic Signature(s) Signed: 01/28/2016 5:06:33 PM By: Gretta Cool, RN, BSN, Kim RN, BSN [place], [place] ([id number]) Entered By: Gretta Cool, RN, BSN, Kim on 01/27/2016 11:30:33 [name] ([id number]) -------------------------------------------------------------------------------- [place] Details Patient Name: [name] Date of Service: 01/27/2016 11:30 AM Medical Record Number: [id number] Patient Account Number: [id number] Date of Birth/Sex: December 22, 1950 (65 y.o. Female) Treating RN: Cornell Barman Primary Care Physician: Charlott Rakes Other Clinician: Referring Physician: Charlott Rakes Treating Physician/Extender: Frann Rider in Treatment: 4 Vital Signs Time Taken: 11:18 Temperature (F): 98.5 Height (in): 66 Pulse (bpm): 81 Weight (lbs): 195 Respiratory Rate (breaths/min): 18 Body Mass Index (BMI): 31.5 Blood Pressure (mmHg): 109/55 Reference Range: 80 - 120 mg / dl Electronic Signature(s) Signed: 01/28/2016 5:06:33 PM By: Gretta Cool, RN, BSN, Kim RN, BSN Entered By: Gretta Cool, RN, BSN, Kim on 01/27/2016 11:19:25

## 2016-02-03 ENCOUNTER — Encounter: Payer: Medicare Other | Admitting: Internal Medicine

## 2016-02-03 DIAGNOSIS — I10 Essential (primary) hypertension: Secondary | ICD-10-CM | POA: Diagnosis not present

## 2016-02-03 DIAGNOSIS — E1140 Type 2 diabetes mellitus with diabetic neuropathy, unspecified: Secondary | ICD-10-CM | POA: Diagnosis not present

## 2016-02-03 DIAGNOSIS — E11621 Type 2 diabetes mellitus with foot ulcer: Secondary | ICD-10-CM | POA: Diagnosis not present

## 2016-02-03 DIAGNOSIS — L97511 Non-pressure chronic ulcer of other part of right foot limited to breakdown of skin: Secondary | ICD-10-CM | POA: Diagnosis not present

## 2016-02-03 DIAGNOSIS — F17218 Nicotine dependence, cigarettes, with other nicotine-induced disorders: Secondary | ICD-10-CM | POA: Diagnosis not present

## 2016-02-03 DIAGNOSIS — E1151 Type 2 diabetes mellitus with diabetic peripheral angiopathy without gangrene: Secondary | ICD-10-CM | POA: Diagnosis not present

## 2016-02-04 ENCOUNTER — Other Ambulatory Visit: Payer: Self-pay | Admitting: Internal Medicine

## 2016-02-04 DIAGNOSIS — IMO0002 Reserved for concepts with insufficient information to code with codable children: Secondary | ICD-10-CM

## 2016-02-04 DIAGNOSIS — E1165 Type 2 diabetes mellitus with hyperglycemia: Principal | ICD-10-CM

## 2016-02-04 DIAGNOSIS — E1140 Type 2 diabetes mellitus with diabetic neuropathy, unspecified: Secondary | ICD-10-CM

## 2016-02-04 NOTE — Progress Notes (Signed)
[name], [name] ([id number]) Visit Report for 02/03/2016 Chief Complaint Document Details Patient Name: [name], [name] Date of Service: 02/03/2016 10:45 AM Medical Record Patient Account Number: [id number] Number: Treating RN: Montey Hora 25-May-1951 (65 y.o. Other Clinician: Date of Birth/Sex: Female) Treating Sofya Moustafa Primary Care Physician/Extender: Quincy Sheehan, RUSHIL Physician: Referring Physician: Evette Georges in Treatment: 5 Information Obtained from: Patient Chief Complaint Patient is here for a wound over her right fifth metatarsal head Electronic Signature(s) Signed: 02/03/2016 5:34:52 PM By: Linton Ham MD Entered By: Linton Ham on 02/03/2016 11:30:35 [name] ([id number]) -------------------------------------------------------------------------------- Debridement Details Patient Name: [name] Date of Service: 02/03/2016 10:45 AM Medical Record Patient Account Number: [id number] Number: Treating RN: Montey Hora 22-Apr-1951 (65 y.o. Other Clinician: Date of Birth/Sex: Female) Treating Orvilla Truett Primary Care Physician/Extender: Quincy Sheehan, RUSHIL Physician: Referring Physician: Charlott Rakes Weeks in Treatment: 5 Debridement Performed for Wound #1 Right,Lateral Metatarsal head fifth Assessment: Performed By: Physician Ricard Dillon, MD Debridement: Debridement Pre-procedure Yes - 11:14 Verification/Time Out Taken: Start Time: 11:15 Pain Control: Lidocaine 4% Topical Solution Level: Skin/Subcutaneous Tissue Total Area Debrided (L x 0.2 (cm) x 0.2 (cm) = 0.04 (cm) W): Tissue and other Viable, Non-Viable, Callus, Fibrin/Slough, Subcutaneous material debrided: Instrument: Curette Bleeding: Minimum Hemostasis Achieved: Pressure End Time: 11:18 Procedural Pain: 0 Post Procedural Pain: 0 Response to Treatment: Procedure was tolerated well Post Debridement Measurements of Total Wound Length:  (cm) 0.2 Width: (cm) 0.2 Depth: (cm) 0.2 Volume: (cm) 0.006 Character of Wound/Ulcer Post Requires Further Debridement Debridement: Severity of Tissue Post Debridement: Limited to breakdown of skin Post Procedure Diagnosis Same as Pre-procedure Electronic Signature(s) Signed: 02/03/2016 5:18:20 PM By: Teddy Spike, [name] ([id number]) Signed: 02/03/2016 5:34:52 PM By: Linton Ham MD Entered By: Linton Ham on 02/03/2016 11:40:16 [name] ([id number]) -------------------------------------------------------------------------------- HPI Details Patient Name: [name] Date of Service: 02/03/2016 10:45 AM Medical Record Patient Account Number: [id number] Number: Treating RN: Montey Hora 03-09-1951 (65 y.o. Other Clinician: Date of Birth/Sex: Female) Treating Milferd Ansell Primary Care Physician/Extender: Verne Spurr Physician: Referring Physician: Evette Georges in Treatment: 5 History of Present Illness HPI Description: 12/30/15; this is a 65 year old type II diabetic with diabetic neuropathy. 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 She also has a known history of PAD with arterial studies from November 2016 showing reasonably normal ABIs 1.11 on the right and 1.14 on the left but quite reduced TBIs at 0.45 on the right and 0.29 on the left. She also is a continued smoker. She was recently in the emergency room with drainage coming out of a foot ulcer over the right fifth metatarsal head that is come on over the last 2 weeks. She tells me it took an awful long time for her to get diabetic shoes in spite of the amputation of the right first metatarsal ray in the fall. She only got the shoes a month ago. Apparently she had her inserts adjusted by Dr. Jacqualyn Posey of podiatric surgery who is been following her for routine foot care. Emergency department she had a negative  x-ray for osteomyelitis and was given 2 weeks worth of doxycycline. This started on 12/25/15. On that trip to the emergency room she was noted to have a 1 cm slough area on the ventral surface of her right foot at the base of the fifth digit mild warmth and swelling were noted there was no streaking and no purulence.  She was discharged on doxycycline 01/06/16; the patient states that the healing sandal straps irritate at the top of her foot therefore she has been using her own sandals. She is been on her feet quite a bit. 01/19/2016 -- the patient has had a gastroenteritis and today though she feels well she says she has not brought any one with her to drive and hence we will not be able to put a TCC on her right foot. 01/27/2016 -- she tells me that her left knee is very bad and buckles quite often and she is rather unsteady on her feet. It may be a bad idea to put her in a total contact cast on her right foot. 02/03/16; for one reason or another we have not been able to get this patient in any form of adequate offloading. She did not like are healing sandals because the straps irritate at the top of her foot. She thinks that total contact cast would affect her balance given the instability of her left knee. In any case she is using her own sandals which appear to have a cushion sole. She does complain of pain in the area. Electronic Signature(s) Signed: 02/03/2016 5:34:52 PM By: Linton Ham MD Entered By: Linton Ham on 02/03/2016 11:39:46 [name] ([id number]) -------------------------------------------------------------------------------- Physical Exam Details Patient Name: [name] Date of Service: 02/03/2016 10:45 AM Medical Record Patient Account Number: [id number] Number: Treating RN: Montey Hora 12/01/1950 (65 y.o. Other Clinician: Date of Birth/Sex: Female) Treating Denny Lave Primary Care Physician/Extender: Quincy Sheehan,  RUSHIL Physician: Referring Physician: Charlott Rakes Weeks in Treatment: 5 Constitutional Sitting or standing Blood Pressure is within target range for patient.. Pulse regular and within target range for patient.Marland Kitchen Respirations regular, non-labored and within target range.. Temperature is normal and within the target range for the patient.. Cardiovascular Pedal pulses palpable and strong bilaterally.. Notes Wound exam: The wound is on the right lateral aspect of her fifth metatarsal head. This requires debridement of callus nonviable subcutaneous tissue leaving her with a very small probing hold perhaps 0.2 mm. Although she complained of pain in the area and some tenderness I see no evidence of infection there is no purulent drainage. There is a lot of circumferential callus suggesting inadequate pressure-relief Electronic Signature(s) Signed: 02/03/2016 5:34:52 PM By: Linton Ham MD Entered By: Linton Ham on 02/03/2016 11:43:42 [name] ([id number]) -------------------------------------------------------------------------------- Physician Orders Details Patient Name: [name] Date of Service: 02/03/2016 10:45 AM Medical Record Patient Account Number: [id number] Number: Treating RN: Montey Hora 03/27/1951 (64 y.o. Other Clinician: Date of Birth/Sex: Female) Treating Reylynn Vanalstine Primary Care Physician/Extender: Quincy Sheehan, RUSHIL Physician: Referring Physician: Evette Georges in Treatment: 5 Verbal / Phone Orders: Yes Clinician: Montey Hora Read Back and Verified: Yes Diagnosis Coding Wound Cleansing Wound #1 Right,Lateral Metatarsal head fifth o Clean wound with Normal Saline. Anesthetic Wound #1 Right,Lateral Metatarsal head fifth o Topical Lidocaine 4% cream applied to wound bed prior to debridement Primary Wound Dressing Wound #1 Right,Lateral Metatarsal head fifth o Iodosorb Ointment Secondary Dressing Wound #1  Right,Lateral Metatarsal head fifth o Dry Gauze - felt for offloading Dressing Change Frequency Wound #1 Right,Lateral Metatarsal head fifth o Change dressing every other day. Follow-up Appointments Wound #1 Right,Lateral Metatarsal head fifth o Return Appointment in 1 week. Edema Control o Elevate legs to the level of the heart and pump ankles as often as possible Off-Loading Wound #1 Right,Lateral Metatarsal head fifth o Open toe surgical shoe to: - Right with peg assist  Additional Orders / Instructions GRAVE, Ica ([id number]) Wound #1 Right,Lateral Metatarsal head fifth o Stop Smoking o Increase protein intake. Electronic Signature(s) Signed: 02/03/2016 5:18:20 PM By: Montey Hora Signed: 02/03/2016 5:34:52 PM By: Linton Ham MD Entered By: Montey Hora on 02/03/2016 11:19:07 [name] ([id number]) -------------------------------------------------------------------------------- Problem List Details Patient Name: [name] Date of Service: 02/03/2016 10:45 AM Medical Record Patient Account Number: [id number] Number: Treating RN: Montey Hora [birth year] (64 y.o. 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 E11.51 Type 2 diabetes mellitus with diabetic peripheral 12/30/2015 Yes angiopathy without gangrene F17.218 Nicotine dependence, cigarettes, with other nicotine- 01/19/2016 Yes induced disorders Inactive Problems Resolved Problems Electronic Signature(s) Signed: 02/03/2016 5:34:52 PM By: Linton Ham MD Entered By: Linton Ham on  02/03/2016 11:28:24 [name] ([id number]) -------------------------------------------------------------------------------- Progress Note Details Patient Name: [name] Date of Service: 02/03/2016 10:45 AM Medical Record Patient Account Number: [id number] Number: Treating RN: Montey Hora 09-15-1950 (64 y.o. Other Clinician: Date of Birth/Sex: Female) Treating Brainard Highfill Primary Care Physician/Extender: Verne Spurr Physician: Referring Physician: Evette Georges in Treatment: 5 Subjective Chief Complaint Information obtained from Patient Patient is here for a wound over her right fifth metatarsal head History of Present Illness (HPI) 12/30/15; this is a 65 year old type II diabetic with diabetic neuropathy. 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 She also has a known history of PAD with arterial studies from November 2016 showing reasonably normal ABIs 1.11 on the right and 1.14 on the left but quite reduced TBIs at 0.45 on the right and 0.29 on the left. She also is a continued smoker. She was recently in the emergency room with drainage coming out of a foot ulcer over the right fifth metatarsal head that is come on over the last 2 weeks. She tells me it took an awful long time for her to get diabetic shoes in spite of the amputation of the right first metatarsal ray in the fall. She only got the shoes a month ago. Apparently she had her inserts adjusted by Dr. Jacqualyn Posey of podiatric surgery who is been following her for routine foot care. Emergency department she had a negative x-ray for osteomyelitis and was given 2 weeks worth of doxycycline. This started on 12/25/15. On that trip to the emergency room she was noted to have a 1 cm slough area on the ventral surface of her right foot at the base of the fifth digit mild warmth and swelling were noted there was no streaking and no  purulence. She was discharged on doxycycline 01/06/16; the patient states that the healing sandal straps irritate at the top of her foot therefore she has been using her own sandals. She is been on her feet quite a bit. 01/19/2016 -- the patient has had a gastroenteritis and today though she feels well she says she has not brought any one with her to drive and hence we will not be able to put a TCC on her right foot. 01/27/2016 -- she tells me that her left knee is very bad and buckles quite often and she is rather unsteady on her feet. It may  be a bad idea to put her in a total contact cast on her right foot. 02/03/16; for one reason or another we have not been able to get this patient in any form of adequate offloading. She did not like are healing sandals because the straps irritate at the top of her foot. She thinks that total contact cast would affect her balance given the instability of her left knee. In any case she is using her own sandals which appear to have a cushion sole. She does complain of pain in the area. [name], [name] ([id number]) Objective Constitutional Sitting or standing Blood Pressure is within target range for patient.. Pulse regular and within target range for patient.Marland Kitchen Respirations regular, non-labored and within target range.. Temperature is normal and within the target range for the patient.. Vitals Time Taken: 10:55 AM, Height: 66 in, Weight: 195 lbs, BMI: 31.5, Temperature: 98.7 F, Pulse: 89 bpm, Respiratory Rate: 18 breaths/min, Blood Pressure: 121/73 mmHg. Cardiovascular Pedal pulses palpable and strong bilaterally.. General Notes: Wound exam: The wound is on the right lateral aspect of her fifth metatarsal head. This requires debridement of callus nonviable subcutaneous tissue leaving her with a very small probing hold perhaps 0.2 mm. Although she complained of pain in the area and some tenderness I see no evidence of infection there is no purulent drainage.  There is a lot of circumferential callus suggesting inadequate pressure-relief Integumentary (Hair, Skin) Wound #1 status is Open. Original cause of wound was Pressure Injury. The wound is located on the Right,Lateral Metatarsal head fifth. The wound measures 0.2cm length x 0.2cm width x 0.1cm depth; 0.031cm^2 area and 0.003cm^3 volume. The wound is limited to skin breakdown. There is no tunneling or undermining noted. There is a medium amount of serous drainage noted. The wound margin is flat and intact. There is no granulation within the wound bed. There is a large (67-100%) amount of necrotic tissue within the wound bed including Eschar. The periwound skin appearance exhibited: Callus, Induration. The periwound skin appearance did not exhibit: Crepitus, Excoriation, Fluctuance, Friable, Localized Edema, Rash, Scarring, Dry/Scaly, Maceration, Moist, Atrophie Blanche, Cyanosis, Ecchymosis, Hemosiderin Staining, Mottled, Pallor, Rubor, Erythema. Periwound temperature was noted as No Abnormality. The periwound has tenderness on palpation. Wound #2 status is Healed - Epithelialized. Original cause of wound was Trauma. The wound is located on the Right,Proximal Metatarsal head fifth. The wound measures 0cm length x 0cm width x 0cm depth; 0cm^2 area and 0cm^3 volume. 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 There was a Skin/Subcutaneous Tissue Debridement [id number]) debridement with total area of 0.04 sq cm performed by Ricard Dillon, MD. with the following instrument(s): Curette to remove Viable and Non-Viable tissue/material including Fibrin/Slough, Callus, and Subcutaneous after achieving pain control using Lidocaine 4% Topical Solution. A time out was conducted at 11:14, prior to the start of the procedure. A Minimum amount of bleeding was controlled with Pressure. The procedure was tolerated well with a pain level of 0 throughout and a pain level of 0 following the procedure. Post Debridement Measurements: 0.2cm length x 0.2cm width x 0.2cm depth; 0.006cm^3 volume. Character  of Wound/Ulcer Post Debridement requires further debridement. Severity of Tissue Post Debridement is: Limited to breakdown of skin. Post procedure Diagnosis Wound #1: Same as Pre-Procedure Plan Wound Cleansing: Wound #1 Right,Lateral Metatarsal head fifth: Clean wound with Normal Saline. Anesthetic: Wound #1 Right,Lateral Metatarsal head fifth: Topical Lidocaine 4% cream applied to wound bed prior to debridement Primary Wound Dressing: Wound #1 Right,Lateral Metatarsal head fifth: Iodosorb Ointment Secondary Dressing: Wound #1 Right,Lateral Metatarsal head fifth: Dry Gauze - felt for offloading Dressing Change Frequency: Wound #1 Right,Lateral Metatarsal head fifth: Change dressing every other day. Follow-up Appointments: Wound #1 Right,Lateral Metatarsal head fifth: Return Appointment in 1 week. Edema Control: Elevate legs to the level of the heart and pump ankles as often as possible [name], [name] ([id number]) Off-Loading: Wound #1 Right,Lateral Metatarsal head fifth: Open toe surgical shoe to: - Right with peg assist Additional Orders / Instructions: Wound #1 Right,Lateral Metatarsal head fifth: Stop Smoking Increase protein intake. #1 a very small hole remains. We gave her some Iodosorb ointment to see if we can prompt closure here. #2 I'm not certain that we are going to be successful in getting this area offloaded  even after this area heals #3 although there was some tenderness and complaints of pain I saw very little evidence of infection at this point although we will need to be vigilant about this into next week Electronic Signature(s) Signed: 02/03/2016 5:34:52 PM By: Linton Ham MD Entered By: Linton Ham on 02/03/2016 11:44:41 [name] ([id number]) -------------------------------------------------------------------------------- [place] Details Patient Name: [name] Date of Service: 02/03/2016 Medical Record Patient Account Number: [id number] Number: Treating RN: Montey Hora 06-Sep-1950 (64 y.o. Other Clinician: Date of Birth/Sex: Female) Treating Bing Duffey Primary Care Physician/Extender: Verne Spurr Physician: Weeks in Treatment: 5 Referring Physician: Charlott Rakes Diagnosis Coding ICD-10 Codes Code Description E11.621 Type 2 diabetes mellitus with foot ulcer L97.511 Non-pressure chronic ulcer of other part of right foot limited to breakdown of skin E11.51 Type 2 diabetes mellitus with diabetic peripheral angiopathy without gangrene F17.218 Nicotine dependence, cigarettes, with other nicotine-induced disorders Facility Procedures CPT4 Code: [id number] Description: [id number] - DEB SUBQ TISSUE 20 SQ CM/< ICD-10 Description Diagnosis E11.621 Type 2 diabetes mellitus with foot ulcer Modifier: Quantity: 1 Physician Procedures CPT4 Code: [id number] Description: [id number] - WC PHYS SUBQ TISS 20 SQ CM ICD-10 Description Diagnosis E11.621 Type 2 diabetes mellitus with foot ulcer Modifier: Quantity: 1 Electronic Signature(s) Signed: 02/03/2016 5:34:52 PM By: Linton Ham MD Entered By: Linton Ham on 02/03/2016 11:45:05

## 2016-02-04 NOTE — Telephone Encounter (Signed)
As this patient was seen by me and deemed necessary for their treatment, I will refill this prescription for metformin XR.

## 2016-02-04 NOTE — Progress Notes (Signed)
[name] ([id number]) Visit Report for 02/03/2016 Arrival Information Details Patient Name: [name] Date of Service: 02/03/2016 10:45 AM Medical Record Patient Account Number: [id number] Number: Treating RN: [name] [birth year] (65 y.o. Other Clinician: Date of Birth/Sex: Female) Treating [name] Primary Care Physician/Extender: [name], [name] Physician: Referring Physician: Evette Flowers in Treatment: 5 Visit Information History Since Last Visit Added or deleted any medications: No Patient Arrived: Ambulatory Any new allergies or adverse reactions: No Arrival Time: 10:53 Had a fall or experienced change in No Accompanied By: self activities of daily living that may affect Transfer Assistance: None risk of falls: Patient Identification Verified: Yes Signs or symptoms of abuse/neglect since last No Secondary Verification Process Yes visito Completed: Hospitalized since last visit: No Patient Requires Transmission- No Pain Present Now: No Based Precautions: Patient Has Alerts: Yes Patient Alerts: Patient on Blood Thinner 81mg  Aspirin DM II Electronic Signature(s) Signed: 02/03/2016 5:18:20 PM By: [name] Entered By: [name] on 02/03/2016 10:55:28 [name] ([id number]) -------------------------------------------------------------------------------- Encounter Discharge Information Details Patient Name: [name] Date of Service: 02/03/2016 10:45 AM Medical Record Patient Account Number: [id number] Number: Treating RN: [name] April 04, 1951 (65 y.o. Other Clinician: Date of Birth/Sex: Female) Treating [name] Primary Care Physician/Extender: [name], [name] Physician: Referring Physician: Evette Flowers in Treatment: 5 Encounter Discharge Information Items Discharge Pain Level: 0 Discharge Condition: Stable Ambulatory Status: Ambulatory Discharge Destination:  Home Transportation: Private Auto Accompanied By: self Schedule Follow-up Appointment: Yes Medication Reconciliation completed and provided to Patient/Care No [name]: Provided on Clinical Summary of Care: 02/03/2016 Form Type Recipient Paper Patient DS Electronic Signature(s) Signed: 02/03/2016 11:49:42 AM By: [name] Previous Signature: 02/03/2016 11:34:13 AM Version By: [name] Entered By: [name] on 02/03/2016 11:49:41 [name] ([id number]) -------------------------------------------------------------------------------- Lower Extremity Assessment Details Patient Name: [name] Date of Service: 02/03/2016 10:45 AM Medical Record Patient Account Number: [id number] Number: Treating RN: [name] [birth year] (65 y.o. Other Clinician: Date of Birth/Sex: Female) Treating [name] Primary Care Physician/Extender: [name], [name] Physician: Referring Physician: Charlott Rakes Flowers in Treatment: 5 Vascular Assessment Pulses: Posterior Tibial Dorsalis Pedis Palpable: [Right:Yes] Extremity colors, hair growth, and conditions: Extremity Color: [Right:Normal] Hair Growth on Extremity: [Right:No] Temperature of Extremity: [Right:Warm] Capillary Refill: [Right:< 3 seconds] Electronic Signature(s) Signed: 02/03/2016 5:18:20 PM By: [name] Entered By: [name] on 02/03/2016 11:07:26 [name] ([id number]) -------------------------------------------------------------------------------- Multi Wound Chart Details Patient Name: [name] Date of Service: 02/03/2016 10:45 AM Medical Record Patient Account Number: [id number] Number: Treating RN: [name] 1951/05/12 (65 y.o. Other Clinician: Date of Birth/Sex: Female) Treating [name] Primary Care Physician/Extender: [name], [name] Physician: Referring Physician: Charlott Rakes Flowers in Treatment: 5 Vital Signs Height(in):  66 Pulse(bpm): 89 Weight(lbs): 195 Blood Pressure 121/73 (mmHg): Body Mass Index(BMI): 31 Temperature(F): 98.7 Respiratory Rate 18 (breaths/min): Photos: [N/A:N/A] Wound Location: Right Metatarsal head fifth Right, Proximal Metatarsal N/A - Lateral head fifth Wounding Event: Pressure Injury Trauma N/A Primary Etiology: Diabetic Wound/Ulcer of Trauma, Other N/A the Lower Extremity Secondary Etiology: Pressure Ulcer 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 Comorbid History: Chronic sinus N/A N/A problems/congestion, Asthma, Hypertension, Type II Diabetes, Neuropathy Date Acquired: 12/11/2015 01/27/2016 N/A Flowers of Treatment: 5 1 N/A Wound Status: Open Healed - Epithelialized N/A Measurements L x W x D 0.2x0.2x0.1 0x0x0 N/A (cm) Area (cm) : 0.031 0 N/A Volume (cm) : 0.003 0 N/A % Reduction in Area: 85.40% 100.00% N/A [name] ([id number]) % Reduction in Volume: 97.20%  100.00% N/A Classification: Grade 1 Partial Thickness N/A Exudate Amount: Medium N/A N/A Exudate Type: Serous N/A N/A Exudate Color: amber N/A N/A Wound Margin: Flat and Intact N/A N/A Granulation Amount: None Present (0%) N/A N/A Necrotic Amount: Large (67-100%) N/A N/A Necrotic Tissue: Eschar N/A N/A Exposed Structures: Fascia: No N/A N/A Fat: No Tendon: No Muscle: No Joint: No Bone: No Limited to Skin Breakdown Epithelialization: Small (1-33%) N/A N/A Periwound Skin Texture: Induration: Yes No Abnormalities Noted N/A Callus: Yes Edema: No Excoriation: No Crepitus: No Fluctuance: No Friable: No Rash: No Scarring: No Periwound Skin Maceration: No No Abnormalities Noted N/A Moisture: Moist: No Dry/Scaly: No Periwound Skin Color: Atrophie Blanche: No No Abnormalities Noted N/A Cyanosis: No Ecchymosis: No Erythema: No Hemosiderin Staining: No Mottled: No Pallor: No Rubor: No Temperature: No Abnormality N/A N/A Tenderness on Yes No N/A Palpation: Wound  Preparation: Ulcer Cleansing: N/A N/A Rinsed/Irrigated with Saline Topical Anesthetic Applied: Other: LIDOCAINE 4% Treatment Notes [name] ([id number]) Electronic Signature(s) Signed: 02/03/2016 5:18:20 PM By: [name] Entered By: [name] on 02/03/2016 11:07:50 [name] ([id number]) -------------------------------------------------------------------------------- Multi-Disciplinary Care Plan Details Patient Name: [name] Date of Service: 02/03/2016 10:45 AM Medical Record Patient Account Number: [id number] Number: Treating RN: [name] 1951/01/29 (64 y.o. Other Clinician: Date of Birth/Sex: Female) Treating [name] Primary Care Physician/Extender: [name], [name] Physician: Referring Physician: Evette Flowers in Treatment: 5 Active Inactive Abuse / Safety / Falls / Self Care Management Nursing Diagnoses: Potential for falls Goals: Patient will remain injury free Date Initiated: 12/30/2015 Goal Status: Active Interventions: Assess self care needs on admission and as needed Notes: Nutrition Nursing Diagnoses: Impaired glucose control: actual or potential Goals: Patient/caregiver verbalizes understanding of need to maintain therapeutic glucose control per primary care physician Date Initiated: 12/30/2015 Goal Status: Active Interventions: Assess patient nutrition upon admission and as needed per policy Notes: Orientation to the Wound Care Program Nursing Diagnoses: Knowledge deficit related to the wound healing center program [place], [name] ([id number]) Goals: Patient/caregiver will verbalize understanding of the [facility] Date Initiated: 12/30/2015 Goal Status: Active Interventions: Provide education on orientation to the wound center Notes: Pressure Nursing Diagnoses: Knowledge deficit related to management of pressures ulcers Goals: Patient will remain free from development of  additional pressure ulcers Date Initiated: 12/30/2015 Goal Status: Active Patient will remain free of pressure ulcers Date Initiated: 12/30/2015 Goal Status: Active Interventions: Assess offloading mechanisms upon admission and as needed Notes: Wound/Skin Impairment Nursing Diagnoses: Impaired tissue integrity Goals: Patient/caregiver will verbalize understanding of skin care regimen Date Initiated: 12/30/2015 Goal Status: Active Ulcer/skin breakdown will heal within 14 Flowers Date Initiated: 12/30/2015 Goal Status: Active Interventions: Assess patient/caregiver ability to obtain necessary supplies Notes: CAMARA, FLORER ([id number]) Electronic Signature(s) Signed: 02/03/2016 5:18:20 PM By: [name] Entered By: [name] on 02/03/2016 11:07:35 [name] ([id number]) -------------------------------------------------------------------------------- Pain Assessment Details Patient Name: [name] Date of Service: 02/03/2016 10:45 AM Medical Record Patient Account Number: [id number] Number: Treating RN: [name] 1951/05/16 (64 y.o. Other Clinician: Date of Birth/Sex: Female) Treating [name] Primary Care Physician/Extender: [name], [name] Physician: Referring Physician: Charlott Rakes Flowers in Treatment: 5 Active Problems Location of Pain Severity and Description of Pain Patient Has Paino No Site Locations Pain Management and Medication Current Pain Management: Notes Topical or injectable lidocaine is offered to patient for acute pain when surgical debridement is performed. If needed, Patient is instructed to use over the counter pain medication for the following 24-48 hours after debridement. Wound  care MDs do not prescribed pain medications. Patient has chronic pain or uncontrolled pain. Patient has been instructed to make an appointment with their Primary Care Physician for pain management. Electronic Signature(s) Signed:  02/03/2016 5:18:20 PM By: [name] Entered By: [name] on 02/03/2016 10:55:40 [name] ([id number]) -------------------------------------------------------------------------------- Patient/Caregiver Education Details Patient Name: [name] Date of Service: 02/03/2016 10:45 AM Medical Record Patient Account Number: [id number] Number: Treating RN: [name] July 20, 1950 (64 y.o. Other Clinician: Date of Birth/Gender: Female) Treating [name] Primary Care Physician/Extender: Verne Spurr Physician: Flowers in Treatment: 5 Referring Physician: Charlott Rakes Education Assessment Education Provided To: Patient Education Topics Provided Wound/Skin Impairment: Handouts: Other: wound care as ordered Methods: Demonstration, Explain/Verbal Responses: State content correctly Electronic Signature(s) Signed: 02/03/2016 5:18:20 PM By: [name] Entered By: [name] on 02/03/2016 11:50:01 [name] ([id number]) -------------------------------------------------------------------------------- Wound Assessment Details Patient Name: [name] Date of Service: 02/03/2016 10:45 AM Medical Record Patient Account Number: [id number] Number: Treating RN: [name] [birth year] (64 y.o. Other Clinician: Date of Birth/Sex: Female) Treating [name] Primary Care Physician/Extender: [name], [name] Physician: Referring Physician: Charlott Rakes Flowers in Treatment: 5 Wound Status Wound Number: 1 Primary Diabetic Wound/Ulcer of the Lower Etiology: Extremity Wound Location: Right Metatarsal head fifth - Lateral Secondary Pressure Ulcer Etiology: Wounding Event: Pressure Injury Wound Open Date Acquired: 12/11/2015 Status: Flowers Of Treatment: 5 Comorbid Chronic sinus problems/congestion, Clustered Wound: No History: Asthma, Hypertension, Type II Diabetes, Neuropathy Photos Wound Measurements Length: (cm)  0.2 Width: (cm) 0.2 Depth: (cm) 0.1 Area: (cm) 0.031 Volume: (cm) 0.003 % Reduction in Area: 85.4% % Reduction in Volume: 97.2% Epithelialization: Small (1-33%) Tunneling: No Undermining: No Wound Description Classification: Grade 1 Wound Margin: Flat and Intact Exudate Amount: Medium Exudate Type: Serous Exudate Color: amber Foul Odor After Cleansing: No Wound Bed Latona, Paisley ([id number]) Granulation Amount: None Present (0%) Exposed Structure Necrotic Amount: Large (67-100%) Fascia Exposed: No Necrotic Quality: Eschar Fat Layer Exposed: No Tendon Exposed: No Muscle Exposed: No Joint Exposed: No Bone Exposed: No Limited to Skin Breakdown Periwound Skin Texture Texture Color No Abnormalities Noted: No No Abnormalities Noted: No Callus: Yes Atrophie Blanche: No Crepitus: No Cyanosis: No Excoriation: No Ecchymosis: No Fluctuance: No Erythema: No Friable: No Hemosiderin Staining: No Induration: Yes Mottled: No Localized Edema: No Pallor: No Rash: No Rubor: No Scarring: No Temperature / Pain Moisture Temperature: No Abnormality No Abnormalities Noted: No Tenderness on Palpation: Yes Dry / Scaly: No Maceration: No Moist: No Wound Preparation Ulcer Cleansing: Rinsed/Irrigated with Saline Topical Anesthetic Applied: Other: LIDOCAINE 4%, Treatment Notes Wound #1 (Right, Lateral Metatarsal head fifth) 1. Cleansed with: Clean wound with Normal Saline Cleanse wound with antibacterial soap and water 2. Anesthetic Topical Lidocaine 4% cream to wound bed prior to debridement 4. Dressing Applied: Iodosorb Ointment 5. Secondary Dressing Applied Gauze and Kerlix/Conform 7. Secured with Tape Notes felt TIFFONY, CLARIZIO ([id number]) Electronic Signature(s) Signed: 02/03/2016 5:18:20 PM By: [name] Entered By: [name] on 02/03/2016 11:06:16 [name]  ([id number]) -------------------------------------------------------------------------------- Wound Assessment Details Patient Name: [name] Date of Service: 02/03/2016 10:45 AM Medical Record Patient Account Number: [id number] Number: Treating RN: [name] 1951/01/21 (64 y.o. Other Clinician: Date of Birth/Sex: Female) Treating [name] Primary Care Physician/Extender: [name], [name] Physician: Referring Physician: Charlott Rakes Flowers in Treatment: 5 Wound Status Wound Number: 2 Primary Etiology: Trauma, Other Wound Location: Right, Proximal Metatarsal Secondary Diabetic Wound/Ulcer of the Lower head fifth Etiology: Extremity Wounding Event: Trauma Wound Status: Healed -  Epithelialized Date Acquired: 01/27/2016 Flowers Of Treatment: 1 Clustered Wound: No Photos Photo Uploaded By: [name] on 02/03/2016 11:06:55 Wound Measurements Length: (cm) 0 % Reduction Width: (cm) 0 % Reduction Depth: (cm) 0 Area: (cm) 0 Volume: (cm) 0 in Area: 100% in Volume: 100% Wound Description Classification: Partial Thickness Periwound Skin Texture Texture Color No Abnormalities Noted: No No Abnormalities Noted: No Moisture No Abnormalities Noted: No BEAUTIFUL, KERSCHNER ([id number]) Electronic Signature(s) Signed: 02/03/2016 5:18:20 PM By: [name] Entered By: [name] on 02/03/2016 11:05:35 [name] ([id number]) -------------------------------------------------------------------------------- [place] Details Patient Name: [name] Date of Service: 02/03/2016 10:45 AM Medical Record Patient Account Number: [id number] Number: Treating RN: [name] 04-03-1951 (64 y.o. Other Clinician: Date of Birth/Sex: Female) Treating [name] Primary Care Physician/Extender: [name], [name] Physician: Referring Physician: Charlott Rakes Flowers in Treatment: 5 Vital Signs Time Taken: 10:55 Temperature (F):  98.7 Height (in): 66 Pulse (bpm): 89 Weight (lbs): 195 Respiratory Rate (breaths/min): 18 Body Mass Index (BMI): 31.5 Blood Pressure (mmHg): 121/73 Reference Range: 80 - 120 mg / dl Electronic Signature(s) Signed: 02/03/2016 5:18:20 PM By: [name] Entered By: [name] on 02/03/2016 10:56:36

## 2016-02-10 ENCOUNTER — Encounter: Payer: Medicare Other | Admitting: Internal Medicine

## 2016-02-10 ENCOUNTER — Other Ambulatory Visit
Admission: RE | Admit: 2016-02-10 | Discharge: 2016-02-10 | Disposition: A | Discharge status: Home / Self Care | Payer: Medicare Other | Source: Ambulatory Visit | Attending: Internal Medicine | Admitting: Internal Medicine

## 2016-02-10 DIAGNOSIS — L089 Local infection of the skin and subcutaneous tissue, unspecified: Secondary | ICD-10-CM | POA: Insufficient documentation

## 2016-02-10 DIAGNOSIS — E11621 Type 2 diabetes mellitus with foot ulcer: Secondary | ICD-10-CM | POA: Diagnosis not present

## 2016-02-10 DIAGNOSIS — E1151 Type 2 diabetes mellitus with diabetic peripheral angiopathy without gangrene: Secondary | ICD-10-CM | POA: Diagnosis not present

## 2016-02-10 DIAGNOSIS — I10 Essential (primary) hypertension: Secondary | ICD-10-CM | POA: Diagnosis not present

## 2016-02-10 DIAGNOSIS — L97511 Non-pressure chronic ulcer of other part of right foot limited to breakdown of skin: Secondary | ICD-10-CM | POA: Diagnosis not present

## 2016-02-10 DIAGNOSIS — F17218 Nicotine dependence, cigarettes, with other nicotine-induced disorders: Secondary | ICD-10-CM | POA: Diagnosis not present

## 2016-02-10 DIAGNOSIS — E1140 Type 2 diabetes mellitus with diabetic neuropathy, unspecified: Secondary | ICD-10-CM | POA: Diagnosis not present

## 2016-02-10 DIAGNOSIS — M1712 Unilateral primary osteoarthritis, left knee: Secondary | ICD-10-CM | POA: Diagnosis not present

## 2016-02-11 NOTE — Progress Notes (Signed)
[name] ([id number]) Visit Report for 02/10/2016 Chief Complaint Document Details Patient Name: [name], [name] Date of Service: 02/10/2016 2:15 PM Medical Record Patient Account Number: [id number] Number: Treating RN: Baruch Gouty, RN, BSN, Rita Aug 10, 1950 [phone] y.o. Other Clinician: Date of Birth/Sex: Female) Treating ROBSON, MICHAEL Primary Care Physician/Extender: Quincy Sheehan, RUSHIL Physician: Referring Physician: Evette Georges in Treatment: 6 Information Obtained from: Patient Chief Complaint Patient is here for a wound over her right fifth metatarsal head Electronic Signature(s) Signed: 02/10/2016 4:33:17 PM By: Linton Ham MD Entered By: Linton Ham on 02/10/2016 14:31:30 [name] ([id number]) -------------------------------------------------------------------------------- Debridement Details Patient Name: [name] Date of Service: 02/10/2016 2:15 PM Medical Record Patient Account Number: [id number] Number: Treating RN: Baruch Gouty, RN, BSN, Rita 09-14-1950 [phone] y.o. Other Clinician: Date of Birth/Sex: Female) Treating ROBSON, MICHAEL Primary Care Physician/Extender: Quincy Sheehan, RUSHIL Physician: Referring Physician: Charlott Rakes Weeks in Treatment: 6 Debridement Performed for Wound #1 Right,Lateral Metatarsal head fifth Assessment: Performed By: Physician Ricard Dillon, MD Debridement: Debridement Pre-procedure Yes - 14:20 Verification/Time Out Taken: Start Time: 14:20 Pain Control: Lidocaine 4% Topical Solution Level: Skin/Subcutaneous Tissue Total Area Debrided (L x 0.1 (cm) x 0.1 (cm) = 0.01 (cm) W): Tissue and other Non-Viable, Callus, Fibrin/Slough, Subcutaneous material debrided: Instrument: Curette Specimen: Swab Number of Specimens 1 Taken: Bleeding: Minimum Hemostasis Achieved: Pressure End Time: 14:25 Procedural Pain: 0 Post Procedural Pain: 0 Response to Treatment: Procedure was tolerated well Post  Debridement Measurements of Total Wound Length: (cm) 0.3 Width: (cm) 0.3 Depth: (cm) 0.3 Volume: (cm) 0.021 Character of Wound/Ulcer Post Stable Debridement: Severity of Tissue Post Debridement: Limited to breakdown of skin Post Procedure Diagnosis Same as Pre-procedure BLANCHE, KIMMINS ([id number]) Electronic Signature(s) Signed: 02/10/2016 4:33:17 PM By: Linton Ham MD Signed: 02/10/2016 4:38:41 PM By: Regan Lemming BSN, RN Entered By: Regan Lemming on 02/10/2016 14:33:31 [name] ([id number]) -------------------------------------------------------------------------------- HPI Details Patient Name: [name] Date of Service: 02/10/2016 2:15 PM Medical Record Patient Account Number: [id number] Number: Treating RN: Baruch Gouty, RN, BSN, Rita 25-Jan-1951 (64 y.o. Other Clinician: Date of Birth/Sex: Female) Treating ROBSON, MICHAEL Primary Care Physician/Extender: Verne Spurr Physician: Referring Physician: Evette Georges in Treatment: 6 History of Present Illness HPI Description: 12/30/15; this is a 65 year old type II diabetic with diabetic neuropathy. 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 She also has a known history of PAD with arterial studies from November 2016 showing reasonably normal ABIs 1.11 on the right and 1.14 on the left but quite reduced TBIs at 0.45 on the right and 0.29 on the left. She also is a continued smoker. She was recently in the emergency room with drainage coming out of a foot ulcer over the right fifth metatarsal head that is come on over the last 2 weeks. She tells me it took an awful long time for her to get diabetic shoes in spite of the amputation of the right first metatarsal ray in the fall. She only got the shoes a month ago. Apparently she had her inserts adjusted by Dr. Jacqualyn Posey of podiatric surgery who is been following her for routine foot care. Emergency  department she had a negative x-ray for osteomyelitis and was given 2 weeks worth of doxycycline. This started on 12/25/15. On that trip to the emergency room she was noted to have a 1 cm slough area on the ventral surface of her right foot at the base of the fifth digit mild  warmth and swelling were noted there was no streaking and no purulence. She was discharged on doxycycline 01/06/16; the patient states that the healing sandal straps irritate at the top of her foot therefore she has been using her own sandals. She is been on her feet quite a bit. 01/19/2016 -- the patient has had a gastroenteritis and today though she feels well she says she has not brought any one with her to drive and hence we will not be able to put a TCC on her right foot. 01/27/2016 -- she tells me that her left knee is very bad and buckles quite often and she is rather unsteady on her feet. It may be a bad idea to put her in a total contact cast on her right foot. 02/03/16; for one reason or another we have not been able to get this patient in any form of adequate offloading. She did not like are healing sandals because the straps irritate at the top of her foot. She thinks that total contact cast would affect her balance given the instability of her left knee. In any case she is using her own sandals which appear to have a cushion sole. She does complain of pain in the area. A/P 30/17; the patient arrives with callus nonviable subcutaneous tissue over the open circumference of this wound. She complains of some tenderness. Plantar aspect of the fifth metatarsal head on the right. I changed her to Iodosorb last week Electronic Signature(s) Signed: 02/10/2016 4:33:17 PM By: Linton Ham MD Entered By: Linton Ham on 02/10/2016 14:34:04 YALINI, DUPLESSIS ([id number]) SHAURI, ELLINGHAM ([id number]) -------------------------------------------------------------------------------- Physical Exam Details Patient Name:  [name] Date of Service: 02/10/2016 2:15 PM Medical Record Patient Account Number: [id number] Number: Treating RN: Baruch Gouty, RN, BSN, Rita 12/13/1950 [phone] y.o. Other Clinician: Date of Birth/Sex: Female) Treating ROBSON, MICHAEL Primary Care Physician/Extender: Quincy Sheehan, RUSHIL Physician: Referring Physician: Charlott Rakes Weeks in Treatment: 6 Cardiovascular Pedal pulses palpable and strong bilaterally.. Musculoskeletal No clear involvement of the fifth metatarsal phalangeal joint. Psychiatric No evidence of depression, anxiety, or agitation. Calm, cooperative, and communicative. Appropriate interactions and affect.. Notes Wound exam; the wound is on the right lateral aspect of the fifth metatarsal head. Debridement of callus nonviable subcutaneous tissue small open area. There is some tenderness here and some warmth. Electronic Signature(s) Signed: 02/10/2016 4:33:17 PM By: Linton Ham MD Entered By: Linton Ham on 02/10/2016 14:35:31 [name] ([id number]) -------------------------------------------------------------------------------- Physician Orders Details Patient Name: [name] Date of Service: 02/10/2016 2:15 PM Medical Record Patient Account Number: [id number] Number: Treating RN: Baruch Gouty, RN, BSN, Rita [birth year] [phone] y.o. Other Clinician: Date of Birth/Sex: Female) Treating ROBSON, MICHAEL Primary Care Physician/Extender: Quincy Sheehan, RUSHIL Physician: Referring Physician: Evette Georges in Treatment: 6 Verbal / Phone Orders: Yes Clinician: Afful, RN, BSN, Rita Read Back and Verified: Yes Diagnosis Coding ICD-10 Coding Code Description E11.621 Type 2 diabetes mellitus with foot ulcer L97.511 Non-pressure chronic ulcer of other part of right foot limited to breakdown of skin E11.51 Type 2 diabetes mellitus with diabetic peripheral angiopathy without gangrene F17.218 Nicotine dependence, cigarettes, with other  nicotine-induced disorders Wound Cleansing Wound #1 Right,Lateral Metatarsal head fifth o Clean wound with Normal Saline. Anesthetic Wound #1 Right,Lateral Metatarsal head fifth o Topical Lidocaine 4% cream applied to wound bed prior to debridement Primary Wound Dressing Wound #1 Right,Lateral Metatarsal head fifth o Iodosorb Ointment Secondary Dressing Wound #1 Right,Lateral Metatarsal head fifth o Dry Gauze - felt for offloading Dressing Change Frequency Wound #1  Right,Lateral Metatarsal head fifth o Change dressing every other day. Follow-up Appointments Wound #1 Right,Lateral Metatarsal head fifth o Return Appointment in 1 week. EILIS, CAROLLO ([id number]) Edema Control o Elevate legs to the level of the heart and pump ankles as often as possible Off-Loading Wound #1 Right,Lateral Metatarsal head fifth o Open toe surgical shoe to: - Right with peg assist Additional Orders / Instructions Wound #1 Right,Lateral Metatarsal head fifth o Stop Smoking o Increase protein intake. Laboratory o Bacteria identified in Wound by Culture (MICRO) - right foot oooo LOINC Code: [id number] oooo Convenience Name: Wound culture routine Radiology o X-ray, foot Electronic Signature(s) Signed: 02/10/2016 4:33:17 PM By: Linton Ham MD Signed: 02/10/2016 4:38:41 PM By: Regan Lemming BSN, RN Entered By: Regan Lemming on 02/10/2016 14:34:24 [name] ([id number]) -------------------------------------------------------------------------------- Problem List Details Patient Name: [name] Date of Service: 02/10/2016 2:15 PM Medical Record Patient Account Number: [id number] Number: Treating RN: Baruch Gouty, RN, BSN, Rita Jan 27, 1951 [phone] y.o. Other Clinician: Date of Birth/Sex: Female) Treating ROBSON, MICHAEL Primary Care Physician/Extender: Verne Spurr Physician: Referring Physician: Evette Georges in Treatment: 6 Active  Problems ICD-10 Encounter Code Description Active Date Diagnosis E11.621 Type 2 diabetes mellitus with foot ulcer 12/30/2015 Yes L97.511 Non-pressure chronic ulcer of other part of right foot 12/30/2015 Yes limited to breakdown of skin E11.51 Type 2 diabetes mellitus with diabetic peripheral 12/30/2015 Yes angiopathy without gangrene F17.218 Nicotine dependence, cigarettes, with other nicotine- 01/19/2016 Yes induced disorders Inactive Problems Resolved Problems Electronic Signature(s) Signed: 02/10/2016 4:33:17 PM By: Linton Ham MD Entered By: Linton Ham on 02/10/2016 14:31:09 [name] ([id number]) -------------------------------------------------------------------------------- Progress Note Details Patient Name: [name] Date of Service: 02/10/2016 2:15 PM Medical Record Patient Account Number: [id number] Number: Treating RN: Baruch Gouty, RN, BSN, Rita May 22, 1951 [phone] y.o. Other Clinician: Date of Birth/Sex: Female) Treating ROBSON, MICHAEL Primary Care Physician/Extender: Verne Spurr Physician: Referring Physician: Evette Georges in Treatment: 6 Subjective Chief Complaint Information obtained from Patient Patient is here for a wound over her right fifth metatarsal head History of Present Illness (HPI) 12/30/15; this is a 64 year old type II diabetic with diabetic neuropathy. 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 She also has a known history of PAD with arterial studies from November 2016 showing reasonably normal ABIs 1.11 on the right and 1.14 on the left but quite reduced TBIs at 0.45 on the right and 0.29 on the left. She also is a continued smoker. She was recently in the emergency room with drainage coming out of a foot ulcer over the right fifth metatarsal head that is come on over the last 2 weeks. She tells me it took an awful long time for her to get diabetic shoes in  spite of the amputation of the right first metatarsal ray in the fall. She only got the shoes a month ago. Apparently she had her inserts adjusted by Dr. Jacqualyn Posey of podiatric surgery who is been following her for routine foot care. Emergency department she had a negative x-ray for osteomyelitis and was given 2 weeks worth of doxycycline. This started on 12/25/15. On that trip to the emergency room she was noted to have a 1 cm slough area on the ventral surface of her right foot at the base of the fifth digit mild warmth and swelling were noted there was no streaking and no purulence. She was discharged on doxycycline 01/06/16; the patient states that the healing sandal  straps irritate at the top of her foot therefore she has been using her own sandals. She is been on her feet quite a bit. 01/19/2016 -- the patient has had a gastroenteritis and today though she feels well she says she has not brought any one with her to drive and hence we will not be able to put a TCC on her right foot. 01/27/2016 -- she tells me that her left knee is very bad and buckles quite often and she is rather unsteady on her feet. It may be a bad idea to put her in a total contact cast on her right foot. 02/03/16; for one reason or another we have not been able to get this patient in any form of adequate offloading. She did not like are healing sandals because the straps irritate at the top of her foot. She thinks that total contact cast would affect her balance given the instability of her left knee. In any case she is using her own sandals which appear to have a cushion sole. She does complain of pain in the area. A/P 30/17; the patient arrives with callus nonviable subcutaneous tissue over the open circumference of this wound. She complains of some tenderness. Plantar aspect of the fifth metatarsal head on the right. I changed her to Iodosorb last week Sopko, Mayfield ([id number]) Objective Constitutional Vitals Time  Taken: 2:04 PM, Height: 66 in, Weight: 195 lbs, BMI: 31.5, Temperature: 97.9 F, Pulse: 100 bpm, Respiratory Rate: 18 breaths/min, Blood Pressure: 122/68 mmHg. Cardiovascular Pedal pulses palpable and strong bilaterally.. Musculoskeletal No clear involvement of the fifth metatarsal phalangeal joint. Psychiatric No evidence of depression, anxiety, or agitation. Calm, cooperative, and communicative. Appropriate interactions and affect.. General Notes: Wound exam; the wound is on the right lateral aspect of the fifth metatarsal head. Debridement of callus nonviable subcutaneous tissue small open area. There is some tenderness here and some warmth. Integumentary (Hair, Skin) Wound #1 status is Open. Original cause of wound was Pressure Injury. The wound is located on the Right,Lateral Metatarsal head fifth. The wound measures 0.1cm length x 0.1cm width x 0.1cm depth; 0.008cm^2 area and 0.001cm^3 volume. The wound is limited to skin breakdown. There is no tunneling or undermining noted. There is a none present amount of drainage noted. The wound margin is flat and intact. There is no granulation within the wound bed. There is a large (67-100%) amount of necrotic tissue within the wound bed including Eschar. The periwound skin appearance exhibited: Callus, Induration, Dry/Scaly. The periwound skin appearance did not exhibit: Crepitus, Excoriation, Fluctuance, Friable, Localized Edema, Rash, Scarring, Maceration, Moist, Atrophie Blanche, Cyanosis, Ecchymosis, Hemosiderin Staining, Mottled, Pallor, Rubor, Erythema. Periwound temperature was noted as No Abnormality. The periwound has tenderness on palpation. Assessment Active Problems ICD-10 E11.621 - Type 2 diabetes mellitus with foot ulcer L97.511 - Non-pressure chronic ulcer of other part of right foot limited to breakdown of skin Fluckiger, Leane ([id number]) E11.51 - Type 2 diabetes mellitus with diabetic peripheral angiopathy without  gangrene F17.218 - Nicotine dependence, cigarettes, with other nicotine-induced disorders Procedures Wound #1 Wound #1 is a Diabetic Wound/Ulcer of the Lower Extremity located on the Right,Lateral Metatarsal head fifth . There was a Skin/Subcutaneous Tissue Debridement [id number]) debridement with total area of 0.01 sq cm performed by Ricard Dillon, MD. with the following instrument(s): Curette to remove Non-Viable tissue/material including Fibrin/Slough, Callus, and Subcutaneous after achieving pain control using Lidocaine 4% Topical Solution. 1 Specimen was taken by a Swab and sent to the lab per  facility protocol.A time out was conducted at 14:20, prior to the start of the procedure. A Minimum amount of bleeding was controlled with Pressure. The procedure was tolerated well with a pain level of 0 throughout and a pain level of 0 following the procedure. Post Debridement Measurements: 0.3cm length x 0.3cm width x 0.3cm depth; 0.021cm^3 volume. Character of Wound/Ulcer Post Debridement is stable. Severity of Tissue Post Debridement is: Limited to breakdown of skin. Post procedure Diagnosis Wound #1: Same as Pre-Procedure Plan Wound Cleansing: Wound #1 Right,Lateral Metatarsal head fifth: Clean wound with Normal Saline. Anesthetic: Wound #1 Right,Lateral Metatarsal head fifth: Topical Lidocaine 4% cream applied to wound bed prior to debridement Primary Wound Dressing: Wound #1 Right,Lateral Metatarsal head fifth: Iodosorb Ointment Secondary Dressing: Wound #1 Right,Lateral Metatarsal head fifth: Dry Gauze - felt for offloading Dressing Change Frequency: Wound #1 Right,Lateral Metatarsal head fifth: Change dressing every other day. Follow-up Appointments: Wound #1 Right,Lateral Metatarsal head fifth: Return Appointment in 1 week. Edema Control: AYMAN, FONSECA ([id number]) Elevate legs to the level of the heart and pump ankles as often as possible Off-Loading: Wound #1  Right,Lateral Metatarsal head fifth: Open toe surgical shoe to: - Right with peg assist Additional Orders / Instructions: Wound #1 Right,Lateral Metatarsal head fifth: Stop Smoking Increase protein intake. Laboratory ordered were: Wound culture routine - right foot Radiology ordered were: X-ray, foot C+S done, repeat xray ordered (one done in ER last month). NO antibiotics ordered continue Iodosorb Patient has not allowed aggressive offloading Electronic Signature(s) Signed: 02/10/2016 4:33:17 PM By: Linton Ham MD Entered By: Linton Ham on 02/10/2016 14:39:47 [name] ([id number]) -------------------------------------------------------------------------------- [place] Details Patient Name: [name] Date of Service: 02/10/2016 Medical Record Patient Account Number: [id number] Number: Treating RN: Baruch Gouty, RN, BSN, Rita 1951/02/11 [phone] y.o. Other Clinician: Date of Birth/Sex: Female) Treating ROBSON, MICHAEL Primary Care Physician/Extender: Verne Spurr Physician: Weeks in Treatment: 6 Referring Physician: Charlott Rakes Diagnosis Coding ICD-10 Codes Code Description E11.621 Type 2 diabetes mellitus with foot ulcer L97.511 Non-pressure chronic ulcer of other part of right foot limited to breakdown of skin E11.51 Type 2 diabetes mellitus with diabetic peripheral angiopathy without gangrene F17.218 Nicotine dependence, cigarettes, with other nicotine-induced disorders Facility Procedures CPT4 Code: [id number] Description: [id number] - DEB SUBQ TISSUE 20 SQ CM/< ICD-10 Description Diagnosis E11.621 Type 2 diabetes mellitus with foot ulcer Modifier: Quantity: 1 Physician Procedures CPT4 Code: [id number] Description: [id number] - WC PHYS SUBQ TISS 20 SQ CM ICD-10 Description Diagnosis E11.621 Type 2 diabetes mellitus with foot ulcer Modifier: Quantity: 1 Electronic Signature(s) Signed: 02/10/2016 4:33:17 PM By: Linton Ham MD Entered By: Linton Ham  on 02/10/2016 14:40:18

## 2016-02-11 NOTE — Progress Notes (Signed)
ULISSA, CLUNIE ([id number]) Visit Report for 02/10/2016 Arrival Information Details Patient Name: [name], [name] Date of Service: 02/10/2016 2:15 PM Medical Record Patient Account Number: [id number] Number: Treating RN: Baruch Gouty, RN, BSN, Rita [birth year] [phone] y.o. Other Clinician: Date of Birth/Sex: Female) Treating ROBSON, MICHAEL Primary Care Physician/Extender: Quincy Sheehan, RUSHIL Physician: Referring Physician: Evette Georges in Treatment: 6 Visit Information History Since Last Visit All ordered tests and consults were completed: No Patient Arrived: Ambulatory Added or deleted any medications: No Arrival Time: 14:02 Any new allergies or adverse reactions: No Accompanied By: grd son Had a fall or experienced change in No Transfer Assistance: None activities of daily living that may affect Patient Identification Verified: Yes risk of falls: Secondary Verification Process Yes Signs or symptoms of abuse/neglect since last No Completed: visito Patient Requires Transmission- No Hospitalized since last visit: No Based Precautions: Has Dressing in Place as Prescribed: Yes Patient Has Alerts: Yes Pain Present Now: No Patient Alerts: Patient on Blood Thinner 81mg  Aspirin DM II Electronic Signature(s) Signed: 02/10/2016 4:38:41 PM By: Regan Lemming BSN, RN Entered By: Regan Lemming on 02/10/2016 14:02:42 [name] ([id number]) -------------------------------------------------------------------------------- Encounter Discharge Information Details Patient Name: [name] Date of Service: 02/10/2016 2:15 PM Medical Record Patient Account Number: [id number] Number: Treating RN: Baruch Gouty, RN, BSN, Rita September 26, 1950 [phone] y.o. Other Clinician: Date of Birth/Sex: Female) Treating ROBSON, MICHAEL Primary Care Physician/Extender: Quincy Sheehan, RUSHIL Physician: Referring Physician: Evette Georges in Treatment: 6 Encounter Discharge Information  Items Discharge Pain Level: 0 Discharge Condition: Stable Ambulatory Status: Ambulatory Discharge Destination: Home Transportation: Private Auto Accompanied By: grd dtr Schedule Follow-up Appointment: No Medication Reconciliation completed and provided to Patient/Care No Janeva Peaster: Provided on Clinical Summary of Care: 02/10/2016 Form Type Recipient Paper Patient DS Electronic Signature(s) Signed: 02/10/2016 4:38:41 PM By: Regan Lemming BSN, RN Previous Signature: 02/10/2016 2:34:50 PM Version By: Ruthine Dose Entered By: Regan Lemming on 02/10/2016 16:25:31 [name] ([id number]) -------------------------------------------------------------------------------- Lower Extremity Assessment Details Patient Name: [name] Date of Service: 02/10/2016 2:15 PM Medical Record Patient Account Number: [id number] Number: Treating RN: Baruch Gouty, RN, BSN, Rita Sep 17, 1950 [phone] y.o. Other Clinician: Date of Birth/Sex: Female) Treating ROBSON, MICHAEL Primary Care Physician/Extender: Quincy Sheehan, RUSHIL Physician: Referring Physician: Charlott Rakes Weeks in Treatment: 6 Vascular Assessment Pulses: Posterior Tibial Dorsalis Pedis Palpable: [Right:Yes] Extremity colors, hair growth, and conditions: Extremity Color: [Right:Normal] Hair Growth on Extremity: [Right:No] Temperature of Extremity: [Right:Warm] Capillary Refill: [Right:< 3 seconds] Toe Nail Assessment Left: Right: Thick: Yes Discolored: Yes Deformed: No Improper Length and Hygiene: No Electronic Signature(s) Signed: 02/10/2016 4:38:41 PM By: Regan Lemming BSN, RN Entered By: Regan Lemming on 02/10/2016 14:08:44 [name] ([id number]) -------------------------------------------------------------------------------- Multi Wound Chart Details Patient Name: [name] Date of Service: 02/10/2016 2:15 PM Medical Record Patient Account Number: [id number] Number: Treating RN: Baruch Gouty, RN, BSN,  Rita [birth year] [phone] y.o. Other Clinician: Date of Birth/Sex: Female) Treating ROBSON, MICHAEL Primary Care Physician/Extender: Quincy Sheehan, RUSHIL Physician: Referring Physician: Charlott Rakes Weeks in Treatment: 6 Vital Signs Height(in): 66 Pulse(bpm): 100 Weight(lbs): 195 Blood Pressure 122/68 (mmHg): Body Mass Index(BMI): 31 Temperature(F): 97.9 Respiratory Rate 18 (breaths/min): Photos: [N/A:N/A] Wound Location: Right Metatarsal head fifth N/A N/A - Lateral Wounding Event: Pressure Injury N/A N/A Primary Etiology: Diabetic Wound/Ulcer of N/A N/A the Lower Extremity Secondary Etiology: Pressure Ulcer N/A N/A Comorbid History: Chronic sinus N/A N/A problems/congestion, Asthma, Hypertension, Type II Diabetes, Neuropathy Date Acquired: 12/11/2015 N/A N/A Weeks of Treatment: 6 N/A N/A Wound Status: Open N/A N/A Measurements L  x W x D 0.1x0.1x0.1 N/A N/A (cm) Area (cm) : 0.008 N/A N/A Volume (cm) : 0.001 N/A N/A % Reduction in Area: 96.20% N/A N/A % Reduction in Volume: 99.10% N/A N/A Castellanos, [name] ([id number]) Classification: Grade 1 N/A N/A Exudate Amount: None Present N/A N/A Wound Margin: Flat and Intact N/A N/A Granulation Amount: None Present (0%) N/A N/A Necrotic Amount: Large (67-100%) N/A N/A Necrotic Tissue: Eschar N/A N/A Exposed Structures: Fascia: No N/A N/A Fat: No Tendon: No Muscle: No Joint: No Bone: No Limited to Skin Breakdown Epithelialization: Large (67-100%) N/A N/A Periwound Skin Texture: Induration: Yes N/A N/A Callus: Yes Edema: No Excoriation: No Crepitus: No Fluctuance: No Friable: No Rash: No Scarring: No Periwound Skin Dry/Scaly: Yes N/A N/A Moisture: Maceration: No Moist: No Periwound Skin Color: Atrophie Blanche: No N/A N/A Cyanosis: No Ecchymosis: No Erythema: No Hemosiderin Staining: No Mottled: No Pallor: No Rubor: No Temperature: No Abnormality N/A N/A Tenderness on Yes N/A N/A Palpation: Wound  Preparation: Ulcer Cleansing: N/A N/A Rinsed/Irrigated with Saline Topical Anesthetic Applied: Other: LIDOCAINE 4% Treatment Notes Electronic Signature(s) LAKSHMI, LESEMAN ([id number]) Signed: 02/10/2016 4:38:41 PM By: Regan Lemming BSN, RN Entered By: Regan Lemming on 02/10/2016 14:22:53 [name] ([id number]) -------------------------------------------------------------------------------- [place] Details Patient Name: [name] Date of Service: 02/10/2016 2:15 PM Medical Record Patient Account Number: [id number] Number: Treating RN: Baruch Gouty, RN, BSN, Rita April 23, 1951 [phone] y.o. Other Clinician: Date of Birth/Sex: Female) Treating ROBSON, MICHAEL Primary Care Physician/Extender: Quincy Sheehan, RUSHIL Physician: Referring Physician: Evette Georges in Treatment: 6 Active Inactive Abuse / Safety / Falls / Self Care Management Nursing Diagnoses: Potential for falls Goals: Patient will remain injury free Date Initiated: 12/30/2015 Goal Status: Active Interventions: Assess self care needs on admission and as needed Notes: Nutrition Nursing Diagnoses: Impaired glucose control: actual or potential Goals: Patient/caregiver verbalizes understanding of need to maintain therapeutic glucose control per primary care physician Date Initiated: 12/30/2015 Goal Status: Active Interventions: Assess patient nutrition upon admission and as needed per policy Notes: Orientation to the Wound Care Program Nursing Diagnoses: Knowledge deficit related to the wound healing center program [place], [name] ([id number]) Goals: Patient/caregiver will verbalize understanding of the [place] Date Initiated: 12/30/2015 Goal Status: Active Interventions: Provide education on orientation to the wound center Notes: Pressure Nursing Diagnoses: Knowledge deficit related to management of pressures ulcers Goals: Patient will remain free from  development of additional pressure ulcers Date Initiated: 12/30/2015 Goal Status: Active Patient will remain free of pressure ulcers Date Initiated: 12/30/2015 Goal Status: Active Interventions: Assess offloading mechanisms upon admission and as needed Notes: Wound/Skin Impairment Nursing Diagnoses: Impaired tissue integrity Goals: Patient/caregiver will verbalize understanding of skin care regimen Date Initiated: 12/30/2015 Goal Status: Active Ulcer/skin breakdown will heal within 14 weeks Date Initiated: 12/30/2015 Goal Status: Active Interventions: Assess patient/caregiver ability to obtain necessary supplies Notes: MARIJA, MURNER ([id number]) Electronic Signature(s) Signed: 02/10/2016 4:38:41 PM By: Regan Lemming BSN, RN Entered By: Regan Lemming on 02/10/2016 14:10:14 [name] ([id number]) -------------------------------------------------------------------------------- Pain Assessment Details Patient Name: [name] Date of Service: 02/10/2016 2:15 PM Medical Record Patient Account Number: [id number] Number: Treating RN: Baruch Gouty, RN, BSN, Rita [birth year] [phone] y.o. Other Clinician: Date of Birth/Sex: Female) Treating ROBSON, MICHAEL Primary Care Physician/Extender: Quincy Sheehan, RUSHIL Physician: Referring Physician: Evette Georges in Treatment: 6 Active Problems Location of Pain Severity and Description of Pain Patient Has Paino No Site Locations With Dressing Change: No Pain Management and Medication Current Pain Management: Electronic Signature(s) Signed: 02/10/2016 4:38:41 PM  By: Regan Lemming BSN, RN Entered By: Regan Lemming on 02/10/2016 14:02:51 [name] ([id number]) -------------------------------------------------------------------------------- Patient/Caregiver Education Details Patient Name: [name] Date of Service: 02/10/2016 2:15 PM Medical Record Patient Account Number: [id number] Number: Treating RN: Baruch Gouty, RN,  BSN, Rita 20-Dec-1950 [phone] y.o. Other Clinician: Date of Birth/Gender: Female) Treating ROBSON, MICHAEL Primary Care Physician/Extender: Verne Spurr Physician: Suella Grove in Treatment: 6 Referring Physician: Charlott Rakes Education Assessment Education Provided To: Patient Education Topics Provided Basic Hygiene: Methods: Explain/Verbal Responses: State content correctly Welcome To The [place]: Methods: Explain/Verbal Responses: State content correctly Wound Debridement: Methods: Explain/Verbal Responses: State content correctly Wound/Skin Impairment: Methods: Explain/Verbal Responses: State content correctly Electronic Signature(s) Signed: 02/10/2016 4:38:41 PM By: Regan Lemming BSN, RN Entered By: Regan Lemming on 02/10/2016 16:25:53 [name] ([id number]) -------------------------------------------------------------------------------- Wound Assessment Details Patient Name: [name] Date of Service: 02/10/2016 2:15 PM Medical Record Patient Account Number: [id number] Number: Treating RN: Baruch Gouty, RN, BSN, Rita May 19, 1951 [phone] y.o. Other Clinician: Date of Birth/Sex: Female) Treating ROBSON, MICHAEL Primary Care Physician/Extender: Quincy Sheehan, RUSHIL Physician: Referring Physician: Charlott Rakes Weeks in Treatment: 6 Wound Status Wound Number: 1 Primary Diabetic Wound/Ulcer of the Lower Etiology: Extremity Wound Location: Right Metatarsal head fifth - Lateral Secondary Pressure Ulcer Etiology: Wounding Event: Pressure Injury Wound Open Date Acquired: 12/11/2015 Status: Weeks Of Treatment: 6 Comorbid Chronic sinus problems/congestion, Clustered Wound: No History: Asthma, Hypertension, Type II Diabetes, Neuropathy Photos Wound Measurements Length: (cm) 0.3 Width: (cm) 0.3 Depth: (cm) 0.3 Area: (cm) 0.071 Volume: (cm) 0.021 % Reduction in Area: 66.5% % Reduction in Volume: 80.2% Epithelialization: Small (1-33%) Tunneling:  No Undermining: No Wound Description Classification: Grade 1 Wound Margin: Flat and Intact Exudate Amount: Medium Exudate Type: Serosanguineous Exudate Color: red, brown Foul Odor After Cleansing: No Wound Bed [place], [place] ([id number]) Granulation Amount: None Present (0%) Exposed Structure Necrotic Amount: Large (67-100%) Fascia Exposed: No Necrotic Quality: Eschar, Adherent Slough Fat Layer Exposed: No Tendon Exposed: No Muscle Exposed: No Joint Exposed: No Bone Exposed: No Limited to Skin Breakdown Periwound Skin Texture Texture Color No Abnormalities Noted: No No Abnormalities Noted: No Callus: Yes Atrophie Blanche: No Crepitus: No Cyanosis: No Excoriation: No Ecchymosis: No Fluctuance: No Erythema: No Friable: No Hemosiderin Staining: No Induration: Yes Mottled: No Localized Edema: No Pallor: No Rash: No Rubor: No Scarring: No Temperature / Pain Moisture Temperature: No Abnormality No Abnormalities Noted: No Tenderness on Palpation: Yes Dry / Scaly: Yes Maceration: No Moist: No Wound Preparation Ulcer Cleansing: Rinsed/Irrigated with Saline Topical Anesthetic Applied: Other: LIDOCAINE 4%, Treatment Notes Wound #1 (Right, Lateral Metatarsal head fifth) 1. Cleansed with: Clean wound with Normal Saline 4. Dressing Applied: Iodoflex 5. Secondary Dressing Applied Gauze and Kerlix/Conform 7. Secured with Tape Notes felt Electronic Signature(s) Signed: 02/10/2016 4:38:41 PM By: Regan Lemming BSN, RN [place], [place] ([id number]) Entered By: Regan Lemming on 02/10/2016 16:38:25 [name] ([id number]) -------------------------------------------------------------------------------- [place] Details Patient Name: [name] Date of Service: 02/10/2016 2:15 PM Medical Record Patient Account Number: [id number] Number: Treating RN: Baruch Gouty, RN, BSN, Rita 1950-07-27 [phone] y.o. Other Clinician: Date of Birth/Sex: Female) Treating ROBSON,  MICHAEL Primary Care Physician/Extender: Quincy Sheehan, RUSHIL Physician: Referring Physician: Charlott Rakes Weeks in Treatment: 6 Vital Signs Time Taken: 14:04 Temperature (F): 97.9 Height (in): 66 Pulse (bpm): 100 Weight (lbs): 195 Respiratory Rate (breaths/min): 18 Body Mass Index (BMI): 31.5 Blood Pressure (mmHg): 122/68 Reference Range: 80 - 120 mg / dl Electronic Signature(s) Signed: 02/10/2016 4:38:41 PM By: Regan Lemming BSN, RN Entered By:  Regan Lemming on 02/10/2016 14:05:22

## 2016-02-13 LAB — AEROBIC CULTURE  (SUPERFICIAL SPECIMEN)

## 2016-02-13 LAB — AEROBIC CULTURE W GRAM STAIN (SUPERFICIAL SPECIMEN)

## 2016-02-17 ENCOUNTER — Ambulatory Visit
Admission: RE | Admit: 2016-02-17 | Discharge: 2016-02-17 | Disposition: A | Discharge status: Home / Self Care | Payer: Medicare Other | Source: Ambulatory Visit | Attending: Internal Medicine | Admitting: Internal Medicine

## 2016-02-17 ENCOUNTER — Other Ambulatory Visit: Payer: Self-pay | Admitting: Internal Medicine

## 2016-02-17 ENCOUNTER — Encounter: Payer: Medicare Other | Attending: Internal Medicine | Admitting: Internal Medicine

## 2016-02-17 DIAGNOSIS — M869 Osteomyelitis, unspecified: Secondary | ICD-10-CM | POA: Insufficient documentation

## 2016-02-17 DIAGNOSIS — L97511 Non-pressure chronic ulcer of other part of right foot limited to breakdown of skin: Secondary | ICD-10-CM | POA: Diagnosis not present

## 2016-02-17 DIAGNOSIS — F419 Anxiety disorder, unspecified: Secondary | ICD-10-CM | POA: Insufficient documentation

## 2016-02-17 DIAGNOSIS — E1140 Type 2 diabetes mellitus with diabetic neuropathy, unspecified: Secondary | ICD-10-CM | POA: Diagnosis not present

## 2016-02-17 DIAGNOSIS — Z7984 Long term (current) use of oral hypoglycemic drugs: Secondary | ICD-10-CM | POA: Diagnosis not present

## 2016-02-17 DIAGNOSIS — M79671 Pain in right foot: Secondary | ICD-10-CM | POA: Diagnosis not present

## 2016-02-17 DIAGNOSIS — F17218 Nicotine dependence, cigarettes, with other nicotine-induced disorders: Secondary | ICD-10-CM | POA: Insufficient documentation

## 2016-02-17 DIAGNOSIS — E11621 Type 2 diabetes mellitus with foot ulcer: Secondary | ICD-10-CM | POA: Insufficient documentation

## 2016-02-17 DIAGNOSIS — E1151 Type 2 diabetes mellitus with diabetic peripheral angiopathy without gangrene: Secondary | ICD-10-CM | POA: Insufficient documentation

## 2016-02-17 DIAGNOSIS — I10 Essential (primary) hypertension: Secondary | ICD-10-CM | POA: Insufficient documentation

## 2016-02-17 DIAGNOSIS — L97512 Non-pressure chronic ulcer of other part of right foot with fat layer exposed: Secondary | ICD-10-CM | POA: Diagnosis not present

## 2016-02-18 NOTE — Progress Notes (Signed)
RIATA, SAYARATH ([id number]) Visit Report for 02/17/2016 Arrival Information Details Patient Name: [name], [name] Date of Service: 02/17/2016 3:00 PM Medical Record Patient Account Number: [id number] Number: Treating RN: Cornell Barman 11-05-1950 (65 y.o. Other Clinician: Date of Birth/Sex: Female) Treating ROBSON, MICHAEL Primary Care Physician/Extender: Quincy Sheehan, RUSHIL Physician: Referring Physician: Evette Georges in Treatment: 7 Visit Information History Since Last Visit Added or deleted any medications: No Patient Arrived: Ambulatory Any new allergies or adverse reactions: No Arrival Time: 15:11 Had a fall or experienced change in No Accompanied By: self activities of daily living that may affect Transfer Assistance: None risk of falls: Patient Identification Verified: Yes Signs or symptoms of abuse/neglect since last No Secondary Verification Process Yes visito Completed: Hospitalized since last visit: No Patient Requires Transmission- No Has Dressing in Place as Prescribed: Yes Based Precautions: Pain Present Now: No Patient Has Alerts: Yes Patient Alerts: Patient on Blood Thinner 81mg  Aspirin DM II Electronic Signature(s) Signed: 02/17/2016 5:55:03 PM By: Gretta Cool, RN, BSN, Kim RN, BSN Entered By: Gretta Cool, RN, BSN, Kim on 02/17/2016 15:14:22 [name] ([id number]) -------------------------------------------------------------------------------- Encounter Discharge Information Details Patient Name: [name] Date of Service: 02/17/2016 3:00 PM Medical Record Patient Account Number: [id number] Number: Treating RN: Baruch Gouty, RN, BSN, Rita May 16, 1951 [phone] y.o. Other Clinician: Date of Birth/Sex: Female) Treating ROBSON, MICHAEL Primary Care Physician/Extender: Quincy Sheehan, RUSHIL Physician: Referring Physician: Evette Georges in Treatment: 7 Encounter Discharge Information Items Discharge Pain Level: 0 Discharge Condition:  Stable Ambulatory Status: Ambulatory Discharge Destination: Home Transportation: Private Auto Accompanied By: self Schedule Follow-up Appointment: Yes Medication Reconciliation completed and provided to Patient/Care Yes Aubree Doody: Provided on Clinical Summary of Care: 02/17/2016 Form Type Recipient Paper Patient DS Electronic Signature(s) Signed: 02/17/2016 5:55:03 PM By: Gretta Cool RN, BSN, Kim RN, BSN Previous Signature: 02/17/2016 3:48:46 PM Version By: Ruthine Dose Entered By: Gretta Cool RN, BSN, Kim on 02/17/2016 16:07:14 [name] ([id number]) -------------------------------------------------------------------------------- Lower Extremity Assessment Details Patient Name: [name] Date of Service: 02/17/2016 3:00 PM Medical Record Patient Account Number: [id number] Number: Treating RN: Cornell Barman 1950-10-05 (65 y.o. Other Clinician: Date of Birth/Sex: Female) Treating ROBSON, MICHAEL Primary Care Physician/Extender: Quincy Sheehan, RUSHIL Physician: Referring Physician: Charlott Rakes Weeks in Treatment: 7 Vascular Assessment Pulses: Posterior Tibial Dorsalis Pedis Palpable: [Right:Yes] Extremity colors, hair growth, and conditions: Extremity Color: [Right:Normal] Hair Growth on Extremity: [Right:No] Temperature of Extremity: [Right:Cold] Capillary Refill: [Right:< 3 seconds] Dependent Rubor: [Right:No] Blanched when Elevated: [Right:No] Lipodermatosclerosis: [Right:No] Toe Nail Assessment Left: Right: Thick: Yes Discolored: Yes Deformed: Yes Improper Length and Hygiene: Yes Electronic Signature(s) Signed: 02/17/2016 5:55:03 PM By: Gretta Cool, RN, BSN, Kim RN, BSN Entered By: Gretta Cool, RN, BSN, Kim on 02/17/2016 15:18:36 [name] ([id number]) -------------------------------------------------------------------------------- Multi Wound Chart Details Patient Name: [name] Date of Service: 02/17/2016 3:00 PM Medical Record Patient Account Number:  [id number] Number: Treating RN: Cornell Barman [birth year] (65 y.o. Other Clinician: Date of Birth/Sex: Female) Treating ROBSON, MICHAEL Primary Care Physician/Extender: Quincy Sheehan, RUSHIL Physician: Referring Physician: Charlott Rakes Weeks in Treatment: 7 Vital Signs Height(in): 66 Pulse(bpm): 90 Weight(lbs): 195 Blood Pressure 109/71 (mmHg): Body Mass Index(BMI): 31 Temperature(F): 98.2 Respiratory Rate 18 (breaths/min): Photos: [N/A:N/A] Wound Location: Right Metatarsal head fifth N/A N/A - Lateral Wounding Event: Pressure Injury N/A N/A Primary Etiology: Diabetic Wound/Ulcer of N/A N/A the Lower Extremity Secondary Etiology: Pressure Ulcer N/A N/A Comorbid History: Chronic sinus N/A N/A problems/congestion, Asthma, Hypertension, Type II Diabetes, Neuropathy Date Acquired: 12/11/2015 N/A N/A Weeks of Treatment: 7 N/A N/A Wound Status:  Open N/A N/A Measurements L x W x D 0.1x0.1x0.1 N/A N/A (cm) Area (cm) : 0.008 N/A N/A Volume (cm) : 0.001 N/A N/A % Reduction in Area: 96.20% N/A N/A % Reduction in Volume: 99.10% N/A N/A [place], [name] ([id number]) Classification: Grade 1 N/A N/A Exudate Amount: Medium N/A N/A Exudate Type: Serosanguineous N/A N/A Exudate Color: red, brown N/A N/A Wound Margin: Flat and Intact N/A N/A Granulation Amount: None Present (0%) N/A N/A Necrotic Amount: Large (67-100%) N/A N/A Necrotic Tissue: Eschar, Adherent Slough N/A N/A Exposed Structures: Fascia: No N/A N/A Fat: No Tendon: No Muscle: No Joint: No Bone: No Limited to Skin Breakdown Epithelialization: Small (1-33%) N/A N/A Periwound Skin Texture: Induration: Yes N/A N/A Callus: Yes Edema: No Excoriation: No Crepitus: No Fluctuance: No Friable: No Rash: No Scarring: No Periwound Skin Dry/Scaly: Yes N/A N/A Moisture: Maceration: No Moist: No Periwound Skin Color: Atrophie Blanche: No N/A N/A Cyanosis: No Ecchymosis: No Erythema: No Hemosiderin  Staining: No Mottled: No Pallor: No Rubor: No Temperature: No Abnormality N/A N/A Tenderness on Yes N/A N/A Palpation: Wound Preparation: Ulcer Cleansing: N/A N/A Rinsed/Irrigated with Saline Topical Anesthetic Applied: Other: LIDOCAINE 4% Treatment Notes EILIS, CAROLLO ([id number]) Electronic Signature(s) Signed: 02/17/2016 5:55:03 PM By: Gretta Cool, RN, BSN, Kim RN, BSN Entered By: Gretta Cool, RN, BSN, Kim on 02/17/2016 15:30:52 [name] ([id number]) -------------------------------------------------------------------------------- Multi-Disciplinary Care Plan Details Patient Name: [name] Date of Service: 02/17/2016 3:00 PM Medical Record Patient Account Number: [id number] Number: Treating RN: Cornell Barman 1951/04/24 (64 y.o. Other Clinician: Date of Birth/Sex: Female) Treating ROBSON, MICHAEL Primary Care Physician/Extender: Quincy Sheehan, RUSHIL Physician: Referring Physician: Evette Georges in Treatment: 7 Active Inactive Abuse / Safety / Falls / Self Care Management Nursing Diagnoses: Potential for falls Goals: Patient will remain injury free Date Initiated: 12/30/2015 Goal Status: Active Interventions: Assess self care needs on admission and as needed Notes: Nutrition Nursing Diagnoses: Impaired glucose control: actual or potential Goals: Patient/caregiver verbalizes understanding of need to maintain therapeutic glucose control per primary care physician Date Initiated: 12/30/2015 Goal Status: Active Interventions: Assess patient nutrition upon admission and as needed per policy Notes: Orientation to the Wound Care Program Nursing Diagnoses: Knowledge deficit related to the wound healing center program [place], [name] ([id number]) Goals: Patient/caregiver will verbalize understanding of the [facility] Date Initiated: 12/30/2015 Goal Status: Active Interventions: Provide education on orientation to the wound  center Notes: Pressure Nursing Diagnoses: Knowledge deficit related to management of pressures ulcers Goals: Patient will remain free from development of additional pressure ulcers Date Initiated: 12/30/2015 Goal Status: Active Patient will remain free of pressure ulcers Date Initiated: 12/30/2015 Goal Status: Active Interventions: Assess offloading mechanisms upon admission and as needed Notes: Wound/Skin Impairment Nursing Diagnoses: Impaired tissue integrity Goals: Patient/caregiver will verbalize understanding of skin care regimen Date Initiated: 12/30/2015 Goal Status: Active Ulcer/skin breakdown will heal within 14 weeks Date Initiated: 12/30/2015 Goal Status: Active Interventions: Assess patient/caregiver ability to obtain necessary supplies Notes: KEAMBRIA, REETER ([id number]) Electronic Signature(s) Signed: 02/17/2016 5:55:03 PM By: Gretta Cool, RN, BSN, Kim RN, BSN Entered By: Gretta Cool, RN, BSN, Kim on 02/17/2016 15:30:20 [name] ([id number]) -------------------------------------------------------------------------------- Pain Assessment Details Patient Name: [name] Date of Service: 02/17/2016 3:00 PM Medical Record Patient Account Number: [id number] Number: Treating RN: Cornell Barman [birth year] (64 y.o. Other Clinician: Date of Birth/Sex: Female) Treating ROBSON, MICHAEL Primary Care Physician/Extender: Quincy Sheehan, RUSHIL Physician: Referring Physician: Charlott Rakes Weeks in Treatment: 7 Active Problems Location of Pain Severity and Description of Pain Patient Has  Paino No Site Locations With Dressing Change: No Pain Management and Medication Current Pain Management: Electronic Signature(s) Signed: 02/17/2016 5:55:03 PM By: Gretta Cool, RN, BSN, Kim RN, BSN Entered By: Gretta Cool, RN, BSN, Kim on 02/17/2016 15:14:30 [name] ([id number]) -------------------------------------------------------------------------------- Patient/Caregiver Education  Details Patient Name: [name] Date of Service: 02/17/2016 3:00 PM Medical Record Patient Account Number: [id number] Number: Treating RN: Cornell Barman 04-16-1951 (64 y.o. Other Clinician: Date of Birth/Gender: Female) Treating ROBSON, MICHAEL Primary Care Physician/Extender: Verne Spurr Physician: Weeks in Treatment: 7 Referring Physician: Charlott Rakes Education Assessment Education Provided To: Patient Education Topics Provided Welcome To [facility]: Wound/Skin Impairment: Handouts: Caring for Your Ulcer, Other: wound care as prescribed Methods: Demonstration, Explain/Verbal Responses: State content correctly Electronic Signature(s) Signed: 02/17/2016 5:55:03 PM By: Gretta Cool, RN, BSN, Kim RN, BSN Entered By: Gretta Cool, RN, BSN, Kim on 02/17/2016 16:08:13 [name] ([id number]) -------------------------------------------------------------------------------- Wound Assessment Details Patient Name: [name] Date of Service: 02/17/2016 3:00 PM Medical Record Patient Account Number: [id number] Number: Treating RN: Cornell Barman July 16, 1950 (64 y.o. Other Clinician: Date of Birth/Sex: Female) Treating ROBSON, MICHAEL Primary Care Physician/Extender: Quincy Sheehan, RUSHIL Physician: Referring Physician: Charlott Rakes Weeks in Treatment: 7 Wound Status Wound Number: 1 Primary Diabetic Wound/Ulcer of the Lower Etiology: Extremity Wound Location: Right Metatarsal head fifth - Lateral Secondary Pressure Ulcer Etiology: Wounding Event: Pressure Injury Wound Open Date Acquired: 12/11/2015 Status: Weeks Of Treatment: 7 Comorbid Chronic sinus problems/congestion, Clustered Wound: No History: Asthma, Hypertension, Type II Diabetes, Neuropathy Photos Wound Measurements Length: (cm) 0.1 Width: (cm) 0.1 Depth: (cm) 0.1 Area: (cm) 0.008 Volume: (cm) 0.001 % Reduction in Area: 96.2% % Reduction in Volume: 99.1% Epithelialization: Small  (1-33%) Wound Description Classification: Grade 1 Wound Margin: Flat and Intact Exudate Amount: Medium Exudate Type: Serosanguineous Exudate Color: red, brown Foul Odor After Cleansing: No Wound Bed Garrels, Kerigan ([id number]) Granulation Amount: None Present (0%) Exposed Structure Necrotic Amount: Large (67-100%) Fascia Exposed: No Necrotic Quality: Eschar, Adherent Slough Fat Layer Exposed: No Tendon Exposed: No Muscle Exposed: No Joint Exposed: No Bone Exposed: No Limited to Skin Breakdown Periwound Skin Texture Texture Color No Abnormalities Noted: No No Abnormalities Noted: No Callus: Yes Atrophie Blanche: No Crepitus: No Cyanosis: No Excoriation: No Ecchymosis: No Fluctuance: No Erythema: No Friable: No Hemosiderin Staining: No Induration: Yes Mottled: No Localized Edema: No Pallor: No Rash: No Rubor: No Scarring: No Temperature / Pain Moisture Temperature: No Abnormality No Abnormalities Noted: No Tenderness on Palpation: Yes Dry / Scaly: Yes Maceration: No Moist: No Wound Preparation Ulcer Cleansing: Rinsed/Irrigated with Saline Topical Anesthetic Applied: Other: LIDOCAINE 4%, Treatment Notes Wound #1 (Right, Lateral Metatarsal head fifth) 1. Cleansed with: Clean wound with Normal Saline 2. Anesthetic Topical Lidocaine 4% cream to wound bed prior to debridement 4. Dressing Applied: Aquacel Ag 5. Secondary Dressing Applied Gauze and Kerlix/Conform 7. Secured with Tape Notes felt JEWELYA, DIRUSSO ([id number]) Electronic Signature(s) Signed: 02/17/2016 5:55:03 PM By: Gretta Cool, RN, BSN, Kim RN, BSN Entered By: Gretta Cool, RN, BSN, Kim on 02/17/2016 15:21:44 [name] ([id number]) -------------------------------------------------------------------------------- [place] Details Patient Name: [name] Date of Service: 02/17/2016 3:00 PM Medical Record Patient Account Number: [id number] Number: Treating RN: Cornell Barman 1951/03/31 (64  y.o. Other Clinician: Date of Birth/Sex: Female) Treating ROBSON, MICHAEL Primary Care Physician/Extender: Quincy Sheehan, RUSHIL Physician: Referring Physician: Charlott Rakes Weeks in Treatment: 7 Vital Signs Time Taken: 15:14 Temperature (F): 98.2 Height (in): 66 Pulse (bpm): 90 Weight (lbs): 195 Respiratory Rate (breaths/min): 18 Body Mass Index (BMI): 31.5  Blood Pressure (mmHg): 109/71 Reference Range: 80 - 120 mg / dl Electronic Signature(s) Signed: 02/17/2016 5:55:03 PM By: Gretta Cool, RN, BSN, Kim RN, BSN Entered By: Gretta Cool, RN, BSN, Kim on 02/17/2016 15:14:55

## 2016-02-19 NOTE — Progress Notes (Signed)
[name], [name] ([id number]) Visit Report for 02/17/2016 Chief Complaint Document Details Patient Name: [name], [name] Date of Service: 02/17/2016 3:00 PM Medical Record Patient Account Number: [id number] Number: Treating RN: Julia Gouty, RN, BSN, [name] 05-18-1951 [phone] y.o. Other Clinician: Date of Birth/Sex: Female) Treating [name] Primary Care Physician/Extender: [name], [name] Physician: Referring Physician: Evette Flowers in Treatment: 7 Information Obtained from: Patient Chief Complaint Patient is here for a wound over her right fifth metatarsal head Electronic Signature(s) Signed: 02/18/2016 12:55:33 PM By: Julia Ham MD Entered By: [name] on 02/17/2016 15:39:22 [name] ([id number]) -------------------------------------------------------------------------------- Debridement Details Patient Name: [name] Date of Service: 02/17/2016 3:00 PM Medical Record Patient Account Number: [id number] Number: Treating RN: Julia Gouty, RN, BSN, [name] March 12, 1951 [phone] y.o. Other Clinician: Date of Birth/Sex: Female) Treating [name] Primary Care Physician/Extender: [name], [name] Physician: Referring Physician: Charlott [name] in Treatment: 7 Debridement Performed for Wound #1 Right,Lateral Metatarsal head fifth Assessment: Performed By: Physician Julia Dillon, MD Debridement: Debridement Pre-procedure Yes - 15:28 Verification/Time Out Taken: Start Time: 15:29 Pain Control: Other : lidocaine 4% Level: Skin/Subcutaneous Tissue Total Area Debrided (L x 0.1 (cm) x 0.1 (cm) = 0.01 (cm) W): Tissue and other Viable, Callus, Subcutaneous material debrided: Instrument: Curette Bleeding: Minimum Hemostasis Achieved: Pressure End Time: 15:33 Procedural Pain: 0 Post Procedural Pain: 0 Response to Treatment: Procedure was tolerated well Post Debridement Measurements of Total Wound Length: (cm) 0.1 Width: (cm)  0.1 Depth: (cm) 0.1 Volume: (cm) 0.001 Character of Wound/Ulcer Post Improved Debridement: Severity of Tissue Post Debridement: Fat layer exposed Post Procedure Diagnosis Same as Pre-procedure Electronic Signature(s) Signed: 02/18/2016 12:55:33 PM By: Julia Ham MD [place], [name] ([id number]) Signed: 02/18/2016 5:31:48 PM By: [name] BSN, RN Entered By: [name] on 02/17/2016 15:38:24 [name] ([id number]) -------------------------------------------------------------------------------- HPI Details Patient Name: [name] Date of Service: 02/17/2016 3:00 PM Medical Record Patient Account Number: [id number] Number: Treating RN: Julia Gouty, RN, BSN, [name] [birth year] [phone] y.o. Other Clinician: Date of Birth/Sex: Female) Treating [name] Primary Care Physician/Extender: [name] Physician: Referring Physician: Evette Flowers in Treatment: 7 History of Present Illness HPI Description: 12/30/15; this is a 65 year old type II diabetic with diabetic neuropathy. 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 She also has a known history of PAD with arterial studies from November 2016 showing reasonably normal ABIs 1.11 on the right and 1.14 on the left but quite reduced TBIs at 0.45 on the right and 0.29 on the left. She also is a continued smoker. She was recently in the emergency room with drainage coming out of a foot ulcer over the right fifth metatarsal head that is come on over the last 2 Flowers. She tells me it took an awful long time for her to get diabetic shoes in spite of the amputation of the right first metatarsal ray in the fall. She only got the shoes a month ago. Apparently she had her inserts adjusted by Dr. Jacqualyn Posey of podiatric surgery who is been following her for routine foot care. Emergency department she had a negative x-ray for osteomyelitis and was given 2 Flowers  worth of doxycycline. This started on 12/25/15. On that trip to the emergency room she was noted to have a 1 cm slough area on the ventral surface of her right foot at the base of the fifth digit mild warmth and swelling were noted there was no streaking and  no purulence. She was discharged on doxycycline 01/06/16; the patient states that the healing sandal straps irritate at the top of her foot therefore she has been using her own sandals. She is been on her feet quite a bit. 01/19/2016 -- the patient has had a gastroenteritis and today though she feels well she says she has not brought any one with her to drive and hence we will not be able to put a TCC on her right foot. 01/27/2016 -- she tells me that her left knee is very bad and buckles quite often and she is rather unsteady on her feet. It may be a bad idea to put her in a total contact cast on her right foot. 02/03/16; for one reason or another we have not been able to get this patient in any form of adequate offloading. She did not like are healing sandals because the straps irritate at the top of her foot. She thinks that total contact cast would affect her balance given the instability of her left knee. In any case she is using her own sandals which appear to have a cushion sole. She does complain of pain in the area. 8/ 30/17; the patient arrives with callus nonviable subcutaneous tissue over the open circumference of this wound. She complains of some tenderness. Plantar aspect of the fifth metatarsal head on the right. I changed her to Iodosorb last week 02/17/16; patient arrives again with nonviable subcutaneous tissue, callus. Requires debridement. X-ray from last week showed no osteomyelitis culture I did of this area showed both Enterobacter and methicillin sensitive staph aureus both sensitive to [name] - [place] DS Electronic Signature(s) [name], [name] ([id number]) Signed: 02/18/2016 12:55:33 PM By: Julia Ham MD Entered By: [name] on 02/17/2016 15:40:31 [name] ([id number]) -------------------------------------------------------------------------------- Physical Exam Details Patient Name: [name] Date of Service: 02/17/2016 3:00 PM Medical Record Patient Account Number: [id number] Number: Treating RN: Julia Gouty, RN, BSN, [name] Jun 01, 1951 [phone] y.o. Other Clinician: Date of Birth/Sex: Female) Treating Tayvien Kane Primary Care Physician/Extender: [name], [name] Physician: Referring Physician: Charlott [name] in Treatment: 7 Constitutional Sitting or standing Blood Pressure is within target range for patient.. Pulse regular and within target range for patient.Marland Kitchen Respirations regular, non-labored and within target range.. Temperature is normal and within the target range for the patient.. Notes Wound exam; plantar fifth metatarsal head wound. Debridement of callus nonviable subcutaneous tissue reveals a small open area. Culture of this from last week noted. There is no surrounding erythema and no drainage. The area looks better than last week. Electronic Signature(s) Signed: 02/18/2016 12:55:33 PM By: Julia Ham MD Entered By: [name] on 02/17/2016 15:41:38 [name] ([id number]) -------------------------------------------------------------------------------- Physician Orders Details Patient Name: [name] Date of Service: 02/17/2016 3:00 PM Medical Record Patient Account Number: [id number] Number: Treating RN: [name] 01-30-1951 (64 y.o. Other Clinician: Date of Birth/Sex: Female) Treating Miciah Shealy Primary Care Physician/Extender: [name], [name] Physician: Referring Physician: Evette Flowers in Treatment: 7 Verbal / Phone Orders: Yes Clinician: Cornell Flowers Read Back and Verified: Yes Diagnosis Coding Wound Cleansing Wound #1 Right,Lateral Metatarsal head fifth o Clean wound with Normal Saline. Anesthetic Wound #1  Right,Lateral Metatarsal head fifth o Topical Lidocaine 4% cream applied to wound bed prior to debridement Primary Wound Dressing Wound #1 Right,Lateral Metatarsal head fifth o Aquacel Ag Secondary Dressing Wound #1 Right,Lateral Metatarsal head fifth o Gauze and Kerlix/Conform Dressing Change Frequency Wound #1 Right,Lateral Metatarsal head fifth o Change dressing every other day. Follow-up Appointments Wound #  1 Right,Lateral Metatarsal head fifth o Return Appointment in 1 week. Edema Control o Elevate legs to the level of the heart and pump ankles as often as possible Off-Loading Wound #1 Right,Lateral Metatarsal head fifth o Open toe surgical shoe to: - Right with peg assist Additional Orders / Instructions [name], [name] ([id number]) Wound #1 Right,Lateral Metatarsal head fifth o Stop Smoking o Increase protein intake. Medications-please add to medication list. Wound #1 Right,Lateral Metatarsal head fifth o P.O. Antibiotics Patient Medications Allergies: Soma, Flexeril Notifications Medication Indication Start End Bactrim DS DOSE 1 - oral 800 mg-160 mg tablet - 1 tablet oral Electronic Signature(s) Signed: 02/17/2016 5:55:03 PM By: Gretta Cool, RN, BSN, Kim RN, BSN Signed: 02/18/2016 12:55:33 PM By: Julia Ham MD Entered By: Gretta Cool, RN, BSN, Kim on 02/17/2016 15:40:02 [name], [name] ([id number]) -------------------------------------------------------------------------------- Prescription 02/17/2016 Patient Name: [name] Physician: Julia Dillon MD Date of Birth: Aug 10, 1950 NPI#: [id number] Sex: F DEA#: [id number] Phone #: [phone] License #: [id number] Patient Address: [place], [place] [address] [facility] [address], [place], [place] [address] [phone] Allergies Soma Flexeril Medication Medication: Route: Strength:  Form: Bactrim DS oral 800 mg-160 mg tablet Class: ABSORBABLE SULFONAMIDE ANTIBACTERIAL AGENTS Dose: Frequency / Time: Indication: 1 1 tablet oral Number of Refills: Number of Units: 0 Generic Substitution: Start Date: End Date: Administered at [place]: No Note to Pharmacy: Signature(s): Date(s): [name], [name] ([id number]) Electronic Signature(s) Signed: 02/18/2016 12:55:33 PM By: Julia Ham MD Entered By: [name] on 02/17/2016 15:42:57 [name] ([id number]) --------------------------------------------------------------------------------  Problem List Details Patient Name: [name] Date of Service: 02/17/2016 3:00 PM Medical Record Patient Account Number: [id number] Number: Treating RN: Julia Gouty, RN, BSN, [name] 1951-03-02 [phone] y.o. 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 E11.51 Type 2 diabetes mellitus with diabetic peripheral 12/30/2015 Yes angiopathy without gangrene F17.218 Nicotine dependence, cigarettes, with other nicotine- 01/19/2016 Yes induced disorders Inactive Problems Resolved Problems Electronic Signature(s) Signed: 02/18/2016 12:55:33 PM By: Julia Ham MD Entered By: [name] on 02/17/2016 15:37:39 [name] ([id number]) -------------------------------------------------------------------------------- Progress Note Details Patient Name: [name] Date of Service: 02/17/2016 3:00 PM Medical Record Patient Account Number: [id number] Number: Treating RN: Julia Gouty, RN, BSN, [name] 01/06/1951 [phone]  y.o. Other Clinician: Date of Birth/Sex: Female) Treating Lindee Leason Primary Care Physician/Extender: [name] Physician: Referring Physician: Evette Flowers in Treatment: 7 Subjective Chief Complaint Information obtained from Patient Patient is here for a wound over her right fifth metatarsal head History of Present Illness (HPI) 12/30/15; this is a 65 year old type II diabetic with diabetic neuropathy. 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 She also has a known history of PAD with arterial studies from November 2016 showing reasonably normal ABIs 1.11 on the right and 1.14 on the left but quite reduced TBIs at 0.45 on the right and 0.29 on the left. She also is a continued smoker. She was recently in the emergency room with drainage coming out of a foot ulcer over the right fifth metatarsal head that is come on over the last 2 Flowers. She  tells me it took an awful long time for her to get diabetic shoes in spite of the amputation of the right first metatarsal ray in the fall. She only got the shoes a month ago. Apparently she had her inserts adjusted by Dr. Jacqualyn Posey of podiatric surgery who is been following her for routine foot care. Emergency department she had a negative x-ray for osteomyelitis and was given 2 Flowers worth of doxycycline. This started on 12/25/15. On that trip to the emergency room she was noted to have a 1 cm slough area on the ventral surface of her right foot at the base of the fifth digit mild warmth and swelling were noted there was no streaking and no purulence. She was discharged on doxycycline 01/06/16; the patient states that the healing sandal straps irritate at the top of her foot therefore she has been using her own sandals. She is been on her feet quite a bit. 01/19/2016 -- the patient has had a gastroenteritis and today though she feels well she says she has not brought any one  with her to drive and hence we will not be able to put a TCC on her right foot. 01/27/2016 -- she tells me that her left knee is very bad and buckles quite often and she is rather unsteady on her feet. It may be a bad idea to put her in a total contact cast on her right foot. 02/03/16; for one reason or another we have not been able to get this patient in any form of adequate offloading. She did not like are healing sandals because the straps irritate at the top of her foot. She thinks that total contact cast would affect her balance given the instability of her left knee. In any case she is using her own sandals which appear to have a cushion sole. She does complain of pain in the area. 8/ 30/17; the patient arrives with callus nonviable subcutaneous tissue over the open circumference of this wound. She complains of some tenderness. Plantar aspect of the fifth metatarsal head on the right. I changed her to Iodosorb last week 02/17/16; patient arrives again with nonviable subcutaneous tissue, callus. Requires debridement. X-ray from [place], [name] ([id number]) last week showed no osteomyelitis culture I did of this area showed both Enterobacter and methicillin sensitive staph aureus both sensitive to Septra DS Objective Constitutional Sitting or standing Blood Pressure is within target range for patient.. Pulse regular and within target range for patient.Marland Kitchen Respirations regular, non-labored and within target range.. Temperature is normal and within the target range for the patient.. Vitals Time Taken: 3:14 PM, Height: 66 in, Weight: 195 lbs, BMI: 31.5, Temperature: 98.2 F, Pulse: 90 bpm, Respiratory Rate: 18 breaths/min, Blood Pressure: 109/71 mmHg. General Notes: Wound exam; plantar fifth metatarsal head wound. Debridement of callus nonviable subcutaneous tissue reveals a small open area. Culture of this from last week noted. There is no surrounding erythema and no drainage. The area looks  better than last week. Integumentary (Hair, Skin) Wound #1 status is Open. Original cause of wound was Pressure Injury. The wound is located on the Right,Lateral Metatarsal head fifth. The wound measures 0.1cm length x 0.1cm width x 0.1cm depth; 0.008cm^2 area and 0.001cm^3 volume. The wound is limited to skin breakdown. There is a medium amount of serosanguineous drainage noted. The wound margin is flat and intact. There is no granulation within the wound bed. There is a large (67-100%) amount of necrotic tissue within the wound bed including Eschar and  Adherent Slough. The periwound skin appearance exhibited: Callus, Induration, Dry/Scaly. The periwound skin appearance did not exhibit: Crepitus, Excoriation, Fluctuance, Friable, Localized Edema, Rash, Scarring, Maceration, Moist, Atrophie Blanche, Cyanosis, Ecchymosis, Hemosiderin Staining, Mottled, Pallor, Rubor, Erythema. Periwound temperature was noted as No Abnormality. The periwound has tenderness on palpation. 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 There was a Skin/Subcutaneous Tissue Debridement [id number]) debridement with total area of 0.01 sq cm performed by Julia Dillon, MD. with the following instrument(s): Curette to remove Viable tissue/material including Callus and Subcutaneous after achieving pain control using Other (lidocaine 4%). A time out was conducted at 15:28, prior to the start of the procedure. A Minimum amount of bleeding was controlled with  Pressure. The procedure was tolerated well with a pain level of 0 throughout and a pain level of 0 following the procedure. Post Debridement Measurements: 0.1cm length x 0.1cm width x 0.1cm depth; 0.001cm^3 volume. Character of Wound/Ulcer Post Debridement is improved. Severity of Tissue Post Debridement is: Fat layer exposed. Post procedure Diagnosis Wound #1: Same as Pre-Procedure Plan Wound Cleansing: Wound #1 Right,Lateral Metatarsal head fifth: Clean wound with Normal Saline. Anesthetic: Wound #1 Right,Lateral Metatarsal head fifth: Topical Lidocaine 4% cream applied to wound bed prior to debridement Primary Wound Dressing: Wound #1 Right,Lateral Metatarsal head fifth: Aquacel Ag Secondary Dressing: Wound #1 Right,Lateral Metatarsal head fifth: Gauze and Kerlix/Conform Dressing Change Frequency: Wound #1 Right,Lateral Metatarsal head fifth: Change dressing every other day. Follow-up Appointments: Wound #1 Right,Lateral Metatarsal head fifth: Return Appointment in 1 week. Edema Control: Elevate legs to the level of the heart and pump ankles as often as possible Off-Loading: Wound #1 Right,Lateral Metatarsal head fifth: Open toe surgical shoe to: - Right with peg assist Additional Orders / Instructions: Wound #1 Right,Lateral Metatarsal head fifth: MARY-JANE, HEWELL ([id number]) Stop Smoking Increase protein intake. Medications-please add to medication list.: Wound #1 Right,Lateral Metatarsal head fifth: P.O. Antibiotics The following medication(s) was prescribed: Bactrim DS oral 800 mg-160 mg tablet 1 1 tablet oral change to aquacel septrads bid x 7 days Electronic Signature(s) Signed: 02/18/2016 12:55:33 PM By: Julia Ham MD Entered By: [name] on 02/17/2016 15:42:21 [name] ([id number]) -------------------------------------------------------------------------------- [place] Details Patient Name: [name] Date of Service: 02/17/2016 Medical  Record Patient Account Number: [id number] Number: Treating RN: Julia Gouty, RN, BSN, [name] 04-11-1951 [phone] y.o. Other Clinician: Date of Birth/Sex: Female) Treating [place] Monds Primary Care Physician/Extender: [name] Physician: Flowers in Treatment: 7 Referring Physician: Charlott Julia Diagnosis Coding ICD-10 Codes Code Description E11.621 Type 2 diabetes mellitus with foot ulcer L97.511 Non-pressure chronic ulcer of other part of right foot limited to breakdown of skin E11.51 Type 2 diabetes mellitus with diabetic peripheral angiopathy without gangrene F17.218 Nicotine dependence, cigarettes, with other nicotine-induced disorders Facility Procedures CPT4 Code: [id number] Description: [id number] - DEB SUBQ TISSUE 20 SQ CM/< ICD-10 Description Diagnosis E11.621 Type 2 diabetes mellitus with foot ulcer Modifier: Quantity: 1 Physician Procedures CPT4 Code: [id number] Description: [id number] - WC PHYS SUBQ TISS 20 SQ CM ICD-10 Description Diagnosis E11.621 Type 2 diabetes mellitus with foot ulcer Modifier: Quantity: 1 Electronic Signature(s) Signed: 02/18/2016  12:55:33 PM By: Julia Ham MD Entered By: [name] on 02/17/2016 15:42:48

## 2016-02-22 ENCOUNTER — Telehealth: Payer: Self-pay | Admitting: Internal Medicine

## 2016-02-22 NOTE — Telephone Encounter (Signed)
  Reason for call:   I placed an outgoing call to [name] at 200 PM regarding her left knee surgery.   Assessment/ Plan:   Her right foot ulcer has been healing well since our last visit per the assessment of her wound care doctor  No dyspnea or chest pain since her last visit with me.  She is down to smoking 2 cigarettes/day.   I risk stratified her as low-risk last November when her surgery was first planned.  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 She has a 2.4% risk of cardiac death, non-fatal MI, non-fatal cardiac arrest and 3.6% risk of MI, pulmonary edema, ventricular fibrillation, primary cardiac arrest, and complete heart block though she does not suffer any limitations to her quality of life with these conditions.   I will thus recommend to her orthopedist to proceed her with her surgery and wished her well.    Riccardo Dubin, MD   02/22/2016, 1:59 PM

## 2016-02-24 ENCOUNTER — Encounter: Payer: Medicare Other | Admitting: Internal Medicine

## 2016-02-24 DIAGNOSIS — F17218 Nicotine dependence, cigarettes, with other nicotine-induced disorders: Secondary | ICD-10-CM | POA: Diagnosis not present

## 2016-02-24 DIAGNOSIS — Z09 Encounter for follow-up examination after completed treatment for conditions other than malignant neoplasm: Secondary | ICD-10-CM | POA: Diagnosis not present

## 2016-02-24 DIAGNOSIS — E1140 Type 2 diabetes mellitus with diabetic neuropathy, unspecified: Secondary | ICD-10-CM | POA: Diagnosis not present

## 2016-02-24 DIAGNOSIS — L97511 Non-pressure chronic ulcer of other part of right foot limited to breakdown of skin: Secondary | ICD-10-CM | POA: Diagnosis not present

## 2016-02-24 DIAGNOSIS — E11621 Type 2 diabetes mellitus with foot ulcer: Secondary | ICD-10-CM | POA: Diagnosis not present

## 2016-02-24 DIAGNOSIS — I10 Essential (primary) hypertension: Secondary | ICD-10-CM | POA: Diagnosis not present

## 2016-02-24 DIAGNOSIS — E1151 Type 2 diabetes mellitus with diabetic peripheral angiopathy without gangrene: Secondary | ICD-10-CM | POA: Diagnosis not present

## 2016-02-24 DIAGNOSIS — Z872 Personal history of diseases of the skin and subcutaneous tissue: Secondary | ICD-10-CM | POA: Diagnosis not present

## 2016-02-25 NOTE — Progress Notes (Addendum)
Julia, Flowers ([id number]) Visit Report for 02/24/2016 Arrival Information Details Patient Name: [name], [name] Date of Service: 02/24/2016 2:15 PM Medical Record Patient Account Number: [id number] Number: Treating RN: Baruch Gouty, RN, BSN, Rita 1951-05-03 [phone] y.o. Other Clinician: Date of Birth/Sex: Female) Treating ROBSON, MICHAEL Primary Care Physician/Extender: Quincy Sheehan, RUSHIL Physician: Referring Physician: Evette Georges in Treatment: 8 Visit Information History Since Last Visit All ordered tests and consults were completed: No Patient Arrived: Ambulatory Added or deleted any medications: No Arrival Time: 14:15 Any new allergies or adverse reactions: No Accompanied By: self Had a fall or experienced change in No Transfer Assistance: None activities of daily living that may affect Patient Identification Verified: Yes risk of falls: Secondary Verification Process Yes Signs or symptoms of abuse/neglect since last No Completed: visito Patient Requires Transmission- No Hospitalized since last visit: No Based Precautions: Has Dressing in Place as Prescribed: Yes Patient Has Alerts: Yes Pain Present Now: No Patient Alerts: Patient on Blood Thinner 81mg  Aspirin DM II Electronic Signature(s) Signed: 02/24/2016 5:39:43 PM By: Regan Lemming BSN, RN Entered By: Regan Lemming on 02/24/2016 14:17:46 [name] ([id number]) -------------------------------------------------------------------------------- Clinic Level of Care Assessment Details Patient Name: [name] Date of Service: 02/24/2016 2:15 PM Medical Record Patient Account Number: [id number] Number: Treating RN: Afful, RN, BSN, Rita 1951/05/13 [phone] y.o. Other Clinician: Date of Birth/Sex: Female) Treating ROBSON, MICHAEL Primary Care Physician/Extender: Quincy Sheehan, RUSHIL Physician: Referring Physician: Evette Georges in Treatment: 8 Clinic Level of Care Assessment Items TOOL 4  Quantity Score []  - Use when only an EandM is performed on FOLLOW-UP visit 0 ASSESSMENTS - Nursing Assessment / Reassessment X - Reassessment of Co-morbidities (includes updates in patient status) 1 10 X - Reassessment of Adherence to Treatment Plan 1 5 ASSESSMENTS - Wound and Skin Assessment / Reassessment X - Simple Wound Assessment / Reassessment - one wound 1 5 []  - Complex Wound Assessment / Reassessment - multiple wounds 0 []  - Dermatologic / Skin Assessment (not related to wound area) 0 ASSESSMENTS - Focused Assessment []  - Circumferential Edema Measurements - multi extremities 0 []  - Nutritional Assessment / Counseling / Intervention 0 X - Lower Extremity Assessment (monofilament, tuning fork, pulses) 1 5 []  - Peripheral Arterial Disease Assessment (using hand held doppler) 0 ASSESSMENTS - Ostomy and/or Continence Assessment and Care []  - Incontinence Assessment and Management 0 []  - Ostomy Care Assessment and Management (repouching, etc.) 0 PROCESS - Coordination of Care X - Simple Patient / Family Education for ongoing care 1 15 []  - Complex (extensive) Patient / Family Education for ongoing care 0 []  - Staff obtains Consents, Records, Test Results / Process Orders 0 [place], [place] ([id number]) []  - Staff telephones HHA, Nursing Homes / Clarify orders / etc 0 []  - Routine Transfer to another Facility (non-emergent condition) 0 []  - Routine Hospital Admission (non-emergent condition) 0 []  - New Admissions / Biomedical engineer / Ordering NPWT, Apligraf, etc. 0 []  - Emergency Hospital Admission (emergent condition) 0 []  - Simple Discharge Coordination 0 []  - Complex (extensive) Discharge Coordination 0 PROCESS - Special Needs []  - Pediatric / Minor Patient Management 0 []  - Isolation Patient Management 0 []  - Hearing / Language / Visual special needs 0 []  - Assessment of Community assistance (transportation, D/C planning, etc.) 0 []  - Additional assistance / Altered  mentation 0 []  - Support Surface(s) Assessment (bed, cushion, seat, etc.) 0 INTERVENTIONS - Wound Cleansing / Measurement X - Simple Wound Cleansing - one wound 1 5 []  -  Complex Wound Cleansing - multiple wounds 0 X - Wound Imaging (photographs - any number of wounds) 1 5 []  - Wound Tracing (instead of photographs) 0 []  - Simple Wound Measurement - one wound 0 []  - Complex Wound Measurement - multiple wounds 0 INTERVENTIONS - Wound Dressings X - Small Wound Dressing one or multiple wounds 1 10 []  - Medium Wound Dressing one or multiple wounds 0 []  - Large Wound Dressing one or multiple wounds 0 []  - Application of Medications - topical 0 []  - Application of Medications - injection 0 [place], Alvilda ([id number]) INTERVENTIONS - Miscellaneous []  - External ear exam 0 []  - Specimen Collection (cultures, biopsies, blood, body fluids, etc.) 0 []  - Specimen(s) / Culture(s) sent or taken to Lab for analysis 0 []  - Patient Transfer (multiple staff / Harrel Lemon Lift / Similar devices) 0 []  - Simple Staple / Suture removal (25 or less) 0 []  - Complex Staple / Suture removal (26 or more) 0 []  - Hypo / Hyperglycemic Management (close monitor of Blood Glucose) 0 []  - Ankle / Brachial Index (ABI) - do not check if billed separately 0 X - Vital Signs 1 5 Has the patient been seen at the hospital within the last three years: Yes Total Score: 65 Level Of Care: New/Established - Level 2 Electronic Signature(s) Signed: 02/24/2016 5:39:43 PM By: Regan Lemming BSN, RN Entered By: Regan Lemming on 02/24/2016 14:34:36 [name] ([id number]) -------------------------------------------------------------------------------- Encounter Discharge Information Details Patient Name: [name] Date of Service: 02/24/2016 2:15 PM Medical Record Patient Account Number: [id number] Number: Treating RN: Baruch Gouty, RN, BSN, Rita 06-29-1950 [phone] y.o. Other Clinician: Date of Birth/Sex: Female) Treating ROBSON,  MICHAEL Primary Care Physician/Extender: Quincy Sheehan, RUSHIL Physician: Referring Physician: Evette Georges in Treatment: 8 Encounter Discharge Information Items Discharge Pain Level: 0 Discharge Condition: Stable Ambulatory Status: Ambulatory Discharge Destination: Home Transportation: Private Auto Accompanied By: self Schedule Follow-up Appointment: No Medication Reconciliation completed and provided to Patient/Care No Shayan Bramhall: Provided on Clinical Summary of Care: 02/24/2016 Form Type Recipient Paper Patient DS Electronic Signature(s) Signed: 02/24/2016 2:41:40 PM By: Regan Lemming BSN, RN Previous Signature: 02/24/2016 2:34:45 PM Version By: Ruthine Dose Entered By: Regan Lemming on 02/24/2016 14:41:39 [place], [name] ([id number]) -------------------------------------------------------------------------------- Lower Extremity Assessment Details Patient Name: [name] Date of Service: 02/24/2016 2:15 PM Medical Record Patient Account Number: [id number] Number: Treating RN: Baruch Gouty, RN, BSN, Rita May 15, 1951 [phone] y.o. Other Clinician: Date of Birth/Sex: Female) Treating ROBSON, MICHAEL Primary Care Physician/Extender: Quincy Sheehan, RUSHIL Physician: Referring Physician: Charlott Rakes Weeks in Treatment: 8 Vascular Assessment Pulses: Posterior Tibial Dorsalis Pedis Palpable: [Right:Yes] Extremity colors, hair growth, and conditions: Extremity Color: [Right:Normal] Hair Growth on Extremity: [Right:No] Temperature of Extremity: [Right:Warm] Capillary Refill: [Right:< 3 seconds] Toe Nail Assessment Left: Right: Thick: No Discolored: Yes Deformed: No Improper Length and Hygiene: No Electronic Signature(s) Signed: 02/24/2016 5:39:43 PM By: Regan Lemming BSN, RN Entered By: Regan Lemming on 02/24/2016 14:18:17 [name] ([id number]) -------------------------------------------------------------------------------- Multi Wound Chart Details Patient Name:  [name] Date of Service: 02/24/2016 2:15 PM Medical Record Patient Account Number: [id number] Number: Treating RN: Baruch Gouty, RN, BSN, Rita [birth year] [phone] y.o. Other Clinician: Date of Birth/Sex: Female) Treating ROBSON, MICHAEL Primary Care Physician/Extender: Quincy Sheehan, RUSHIL Physician: Referring Physician: Charlott Rakes Weeks in Treatment: 8 Vital Signs Height(in): 66 Pulse(bpm): 88 Weight(lbs): 195 Blood Pressure 127/71 (mmHg): Body Mass Index(BMI): 31 Temperature(F): 98.2 Respiratory Rate 19 (breaths/min): Photos: [1:No Photos] [N/A:N/A] Wound Location: [1:Right Metatarsal head fifth N/A - Lateral] Wounding Event: [  1:Pressure Injury] [N/A:N/A] Primary Etiology: [1: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] Secondary Etiology: [1:Pressure Ulcer] [N/A:N/A] Comorbid History: [1:Chronic sinus problems/congestion, Asthma, Hypertension, Type II Diabetes, Neuropathy] [N/A:N/A] Date Acquired: [1:12/11/2015] [N/A:N/A] Weeks of Treatment: [1:8] [N/A:N/A] Wound Status: [1:Open] [N/A:N/A] Measurements L x W x D 0.1x0.1x0.1 [N/A:N/A] (cm) Area (cm) : [1:0.008] [N/A:N/A] Volume (cm) : [1:0.001] [N/A:N/A] % Reduction in Area: [1:96.20%] [N/A:N/A] % Reduction in Volume: 99.10% [N/A:N/A] Classification: [1:Grade 1] [N/A:N/A] Exudate Amount: [1:None Present] [N/A:N/A] Wound Margin: [1:Flat and Intact] [N/A:N/A] Granulation Amount: [1:None Present (0%)] [N/A:N/A] Necrotic Amount: [1:Large (67-100%)] [N/A:N/A] Necrotic Tissue: [1:Eschar, Adherent Slough N/A] Exposed Structures: Fascia: No N/A N/A Fat: No Tendon: No Muscle: No Joint: No Bone: No Limited to Skin Breakdown Epithelialization: Large (67-100%) N/A N/A Periwound Skin Texture: Induration: Yes N/A N/A Callus: Yes Edema: No Excoriation: No Crepitus: No Fluctuance: No Friable: No Rash: No Scarring: No Periwound Skin Dry/Scaly: Yes N/A N/A Moisture: Maceration: No Moist: No Periwound  Skin Color: Atrophie Blanche: No N/A N/A Cyanosis: No Ecchymosis: No Erythema: No Hemosiderin Staining: No Mottled: No Pallor: No Rubor: No Temperature: No Abnormality N/A N/A Tenderness on Yes N/A N/A Palpation: Wound Preparation: Ulcer Cleansing: Other: N/A N/A water and soap Topical Anesthetic Applied: Other: LIDOCAINE 4% Treatment Notes Electronic Signature(s) Signed: 02/24/2016 5:39:43 PM By: Regan Lemming BSN, RN Entered By: Regan Lemming on 02/24/2016 14:25:14 [name] ([id number]) -------------------------------------------------------------------------------- [place] Details Patient Name: [name] Date of Service: 02/24/2016 2:15 PM Medical Record Patient Account Number: [id number] Number: Treating RN: Baruch Gouty, RN, BSN, Rita [birth year] [phone] y.o. Other Clinician: Date of Birth/Sex: Female) Treating ROBSON, MICHAEL Primary Care Physician/Extender: Verne Spurr Physician: Referring Physician: Evette Georges in Treatment: 8 Active Inactive Electronic Signature(s) Signed: 03/24/2016 9:45:13 AM By: Gretta Cool RN, BSN, Kim RN, BSN Signed: 06/10/2016 5:33:36 PM By: Regan Lemming BSN, RN Previous Signature: 02/24/2016 5:39:43 PM Version By: Regan Lemming BSN, RN Entered By: Gretta Cool, RN, BSN, Kim on 03/07/2016 16:25:58 [name] ([id number]) -------------------------------------------------------------------------------- Pain Assessment Details Patient Name: [name] Date of Service: 02/24/2016 2:15 PM Medical Record Patient Account Number: [id number] Number: Treating RN: Baruch Gouty, RN, BSN, Rita [birth year] [phone] y.o. Other Clinician: Date of Birth/Sex: Female) Treating ROBSON, MICHAEL Primary Care Physician/Extender: Quincy Sheehan, RUSHIL Physician: Referring Physician: Evette Georges in Treatment: 8 Active Problems Location of Pain Severity and Description of Pain Patient Has Paino No Site Locations With  Dressing Change: No Pain Management and Medication Current Pain Management: Electronic Signature(s) Signed: 02/24/2016 5:39:43 PM By: Regan Lemming BSN, RN Entered By: Regan Lemming on 02/24/2016 14:17:52 [name] ([id number]) -------------------------------------------------------------------------------- Patient/Caregiver Education Details Patient Name: [name] Date of Service: 02/24/2016 2:15 PM Medical Record Patient Account Number: [id number] Number: Treating RN: Baruch Gouty, RN, BSN, Rita 1951-01-10 [phone] y.o. Other Clinician: Date of Birth/Gender: Female) Treating ROBSON, MICHAEL Primary Care Physician/Extender: Verne Spurr Physician: Suella [place] in Treatment: 8 Referring Physician: Charlott Rakes Education Assessment Education Provided To: Patient Education Topics Provided Welcome To [facility]: Methods: Explain/Verbal Responses: State content correctly Electronic Signature(s) Signed: 02/24/2016 5:39:43 PM By: Regan Lemming BSN, RN Entered By: Regan Lemming on 02/24/2016 14:41:51 [name] ([id number]) -------------------------------------------------------------------------------- Wound Assessment Details Patient Name: [name] Date of Service: 02/24/2016 2:15 PM Medical Record Patient Account Number: [id number] Number: Treating RN: Baruch Gouty, RN, BSN, Rita 08-22-1950 [phone] y.o. Other Clinician: Date of Birth/Sex: Female) Treating ROBSON, MICHAEL Primary Care Physician/Extender: Quincy Sheehan, RUSHIL Physician: Referring Physician: Charlott Rakes Weeks in Treatment: 8 Wound Status Wound  Number: 1 Primary Diabetic Wound/Ulcer of the Lower Etiology: Extremity Wound Location: Right Metatarsal head fifth - Lateral Secondary Pressure Ulcer Etiology: Wounding Event: Pressure Injury Wound Healed - Epithelialized Date Acquired: 12/11/2015 Status: Weeks Of Treatment: 8 Comorbid Chronic sinus problems/congestion, Clustered Wound:  No History: Asthma, Hypertension, Type II Diabetes, Neuropathy Photos Photo Uploaded By: Regan Lemming on 02/24/2016 17:36:06 Wound Measurements Length: (cm) 0 % Reduction i Width: (cm) 0 % Reduction i Depth: (cm) 0 Epithelializa Area: (cm) 0 Tunneling: Volume: (cm) 0 Undermining: n Area: 100% n Volume: 100% tion: Large (67-100%) No No Wound Description Classification: Grade 1 Wound Margin: Flat and Intact Exudate Amount: None Present Foul Odor After Cleansing: No Wound Bed Granulation Amount: None Present (0%) Exposed Structure [place], [place] ([id number]) Necrotic Amount: None Present (0%) Fascia Exposed: No Fat Layer Exposed: No Tendon Exposed: No Muscle Exposed: No Joint Exposed: No Bone Exposed: No Limited to Skin Breakdown Periwound Skin Texture Texture Color No Abnormalities Noted: No No Abnormalities Noted: No Callus: Yes Atrophie Blanche: No Crepitus: No Cyanosis: No Excoriation: No Ecchymosis: No Fluctuance: No Erythema: No Friable: No Hemosiderin Staining: No Induration: Yes Mottled: No Localized Edema: No Pallor: No Rash: No Rubor: No Scarring: No Temperature / Pain Moisture Temperature: No Abnormality No Abnormalities Noted: No Dry / Scaly: Yes Maceration: No Moist: No Wound Preparation Ulcer Cleansing: Other: water and soap, Topical Anesthetic Applied: None Electronic Signature(s) Signed: 02/24/2016 5:39:43 PM By: Regan Lemming BSN, RN Entered By: Regan Lemming on 02/24/2016 14:32:13 [name] ([id number]) -------------------------------------------------------------------------------- Vitals Details Patient Name: [name] Date of Service: 02/24/2016 2:15 PM Medical Record Patient Account Number: [id number] Number: Treating RN: Baruch Gouty, RN, BSN, Rita [birth year] [phone] y.o. Other Clinician: Date of Birth/Sex: Female) Treating ROBSON, MICHAEL Primary Care Physician/Extender: Quincy Sheehan, RUSHIL Physician: Referring  Physician: Charlott Rakes Weeks in Treatment: 8 Vital Signs Time Taken: 14:18 Temperature (F): 98.2 Height (in): 66 Pulse (bpm): 88 Weight (lbs): 195 Respiratory Rate (breaths/min): 19 Body Mass Index (BMI): 31.5 Blood Pressure (mmHg): 127/71 Reference Range: 80 - 120 mg / dl Electronic Signature(s) Signed: 02/24/2016 5:39:43 PM By: Regan Lemming BSN, RN Entered By: Regan Lemming on 02/24/2016 14:18:39

## 2016-02-25 NOTE — Progress Notes (Signed)
[name], [name] ([id number]) Visit Report for 02/24/2016 Chief Complaint Document Details Patient Name: [name], [name] Date of Service: 02/24/2016 2:15 PM Medical Record Patient Account Number: [id number] Number: Treating RN: Baruch Gouty, RN, BSN, Rita 1950-12-25 [phone] y.o. Other Clinician: Date of Birth/Sex: Female) Treating ROBSON, MICHAEL Primary Care Physician/Extender: Quincy Sheehan, [place] Physician: Referring Physician: Evette Georges in Treatment: 8 Information Obtained from: Patient Chief Complaint Patient is here for a wound over her right fifth metatarsal head Electronic Signature(s) Signed: 02/25/2016 7:42:49 AM By: Linton Ham MD Entered By: Linton Ham on 02/24/2016 14:38:58 [name] ([id number]) -------------------------------------------------------------------------------- HPI Details Patient Name: [name] Date of Service: 02/24/2016 2:15 PM Medical Record Patient Account Number: [id number] Number: Treating RN: Baruch Gouty, RN, BSN, Rita January 13, 1951 [phone] y.o. Other Clinician: Date of Birth/Sex: Female) Treating ROBSON, MICHAEL Primary Care Physician/Extender: Verne Spurr Physician: Referring Physician: Evette Georges in Treatment: 8 History of Present Illness HPI Description: 12/30/15; this is a 65 year old type II diabetic with diabetic neuropathy. 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 She also has a known history of PAD with arterial studies from November 2016 showing reasonably normal ABIs 1.11 on the right and 1.14 on the left but quite reduced TBIs at 0.45 on the right and 0.29 on the left. She also is a continued smoker. She was recently in the emergency room with drainage coming out of a foot ulcer over the right fifth metatarsal head that is come on over the last 2 weeks. She tells me it took an awful long time for her to get diabetic shoes in spite of  the amputation of the right first metatarsal ray in the fall. She only got the shoes a month ago. Apparently she had her inserts adjusted by Dr. Jacqualyn Posey of podiatric surgery who is been following her for routine foot care. Emergency department she had a negative x-ray for osteomyelitis and was given 2 weeks worth of doxycycline. This started on 12/25/15. On that trip to the emergency room she was noted to have a 1 cm slough area on the ventral surface of her right foot at the base of the fifth digit mild warmth and swelling were noted there was no streaking and no purulence. She was discharged on doxycycline 01/06/16; the patient states that the healing sandal straps irritate at the top of her foot therefore she has been using her own sandals. She is been on her feet quite a bit. 01/19/2016 -- the patient has had a gastroenteritis and today though she feels well she says she has not brought any one with her to drive and hence we will not be able to put a TCC on her right foot. 01/27/2016 -- she tells me that her left knee is very bad and buckles quite often and she is rather unsteady on her feet. It may be a bad idea to put her in a total contact cast on her right foot. 02/03/16; for one reason or another we have not been able to get this patient in any form of adequate offloading. She did not like are healing sandals because the straps irritate at the top of her foot. She thinks that total contact cast would affect her balance given the instability of her left knee. In any case she is using her own sandals which appear to have a cushion sole. She does complain of pain in the area. 8/ 30/17; the patient arrives with callus  nonviable subcutaneous tissue over the open circumference of this wound. She complains of some tenderness. Plantar aspect of the fifth metatarsal head on the right. I changed her to Iodosorb last week 02/17/16; patient arrives again with nonviable subcutaneous tissue, callus.  Requires debridement. X-ray from last week showed no osteomyelitis culture I did of this area showed both Enterobacter and methicillin sensitive staph aureus both sensitive to Septra DS 02/24/16; the patient arrives with no open area. There is still a fair amount a callus in the area over her right fifth metatarsal head but no evidence of an underlying wound. Although she comes in in open toed sandals each week she does have diabetic shoes with custom inserts that she obtain just before coming here. [name], [name] ([id number]) advised her that I think it is reasonable to go ahead and put a thick Band-Aid over this area and again into her diabetic shoes. From discussion with her she obtained the shoes that biotech in [employer]) Signed: 02/25/2016 7:42:49 AM By: Linton Ham MD Entered By: Linton Ham on 02/24/2016 14:40:40 [name] ([id number]) -------------------------------------------------------------------------------- Physical Exam Details Patient Name: [name] Date of Service: 02/24/2016 2:15 PM Medical Record Patient Account Number: [id number] Number: Treating RN: Baruch Gouty, RN, BSN, Rita Jul 03, 1950 [phone] y.o. Other Clinician: Date of Birth/Sex: Female) Treating ROBSON, MICHAEL Primary Care Physician/Extender: Quincy Sheehan, [place] Physician: Referring Physician: Charlott Rakes Weeks in Treatment: 8 Constitutional Pulse regular and within target range for patient.Marland Kitchen Respirations regular, non-labored and within target range.. Patient appears well. Notes Wound exam; plantar fifth metatarsal head has no open area. There is callus here. She has completed her course of antibiotics. Electronic Signature(s) Signed: 02/25/2016 7:42:49 AM By: Linton Ham MD Entered By: Linton Ham on 02/24/2016 14:42:00 [name] ([id number]) -------------------------------------------------------------------------------- Physician Orders  Details Patient Name: [name] Date of Service: 02/24/2016 2:15 PM Medical Record Patient Account Number: [id number] Number: Treating RN: Baruch Gouty, RN, BSN, Rita Jun 24, 1950 [phone] y.o. Other Clinician: Date of Birth/Sex: Female) Treating ROBSON, MICHAEL Primary Care Physician/Extender: Quincy Sheehan, [place] Physician: Referring Physician: Evette Georges in Treatment: 8 Verbal / Phone Orders: Yes Clinician: Afful, RN, BSN, Rita Read Back and Verified: Yes Diagnosis Coding Discharge From [facility] o Discharge from [place] Completed. MD instructed her to keep place offloaded. Wears her diabetic shoes. SHe verbalized understanding. Electronic Signature(s) Signed: 02/24/2016 5:39:43 PM By: Regan Lemming BSN, RN Signed: 02/25/2016 7:42:49 AM By: Linton Ham MD Entered By: Regan Lemming on 02/24/2016 14:33:35 [name] ([id number]) -------------------------------------------------------------------------------- Problem List Details Patient Name: [name] Date of Service: 02/24/2016 2:15 PM Medical Record Patient Account Number: [id number] Number: Treating RN: Baruch Gouty, RN, BSN, Rita 06/09/1951 [phone] y.o. 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 E11.51 Type 2 diabetes mellitus with diabetic peripheral 12/30/2015 Yes angiopathy without gangrene F17.218 Nicotine dependence, cigarettes, with other nicotine- 01/19/2016 Yes induced disorders Inactive Problems Resolved Problems Electronic Signature(s) Signed: 02/25/2016 7:42:49 AM By: Linton Ham  MD Entered By: Linton Ham on 02/24/2016 14:38:35 Backes, [name] ([id number]) -------------------------------------------------------------------------------- Progress Note Details Patient Name: [name] Date of Service: 02/24/2016 2:15 PM Medical Record Patient Account Number: [id number] Number: Treating RN: Baruch Gouty, RN, BSN,  Velva Harman 04-Jul-1950 (65 y.o. Other Clinician: Date of Birth/Sex: Female) Treating ROBSON, MICHAEL Primary Care Physician/Extender: Verne Spurr Physician: Referring Physician: Evette Georges in Treatment: 8 Subjective Chief Complaint Information obtained from Patient Patient is here for a wound over her right fifth metatarsal head History of Present Illness (HPI) 12/30/15; this is a 65 year old type II diabetic with diabetic neuropathy. 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 She also has a known history of PAD with arterial studies from November 2016 showing reasonably normal ABIs 1.11 on the right and 1.14 on the left but quite reduced TBIs at 0.45 on the right and 0.29 on the left. She also is a continued smoker. She was recently in the emergency room with drainage coming out of a foot ulcer over the right fifth metatarsal head that is come on over the last 2 weeks. She tells me it took an awful long time for her to get diabetic shoes in spite of the amputation of the right first metatarsal ray in the fall. She only got the shoes a month ago. Apparently she had her inserts adjusted by Dr. Jacqualyn Posey of podiatric surgery who is been following her for routine foot care. Emergency department she had a negative x-ray for osteomyelitis and was given 2 weeks worth of doxycycline. This started on 12/25/15. On that trip to the emergency room she was noted to have a 1 cm slough area on the ventral surface of her right foot at the base of the fifth digit mild warmth and swelling were noted  there was no streaking and no purulence. She was discharged on doxycycline 01/06/16; the patient states that the healing sandal straps irritate at the top of her foot therefore she has been using her own sandals. She is been on her feet quite a bit. 01/19/2016 -- the patient has had a gastroenteritis and today though she feels well she says she has not brought any one with her to drive and hence we will not be able to put a TCC on her right foot. 01/27/2016 -- she tells me that her left knee is very bad and buckles quite often and she is rather unsteady on her feet. It may be a bad idea to put her in a total contact cast on her right foot. 02/03/16; for one reason or another we have not been able to get this patient in any form of adequate offloading. She did not like are healing sandals because the straps irritate at the top of her foot. She thinks that total contact cast would affect her balance given the instability of her left knee. In any case she is using her own sandals which appear to have a cushion sole. She does complain of pain in the area. 8/ 30/17; the patient arrives with callus nonviable subcutaneous tissue over the open circumference of this wound. She complains of some tenderness. Plantar aspect of the fifth metatarsal head on the right. I changed her to Iodosorb last week 02/17/16; patient arrives again with nonviable subcutaneous tissue, callus. Requires debridement. X-ray from [place], [name] ([id number]) last week showed no osteomyelitis culture I did of this area showed both Enterobacter and methicillin sensitive staph aureus both sensitive to Septra DS 02/24/16; the patient arrives with no open area. There is still a fair amount a callus in the area over her right fifth metatarsal head but no evidence of an underlying wound. Although she comes in  in open toed sandals each week she does have diabetic shoes with custom inserts that she obtain just before coming here. I've advised  her that I think it is reasonable to go ahead and put a thick Band-Aid over this area and again into her diabetic shoes. From discussion with her she obtained the shoes that biotech in [place] Objective Constitutional Pulse regular and within target range for patient.Marland Kitchen Respirations regular, non-labored and within target range.. Patient appears well. Vitals Time Taken: 2:18 PM, Height: 66 in, Weight: 195 lbs, BMI: 31.5, Temperature: 98.2 F, Pulse: 88 bpm, Respiratory Rate: 19 breaths/min, Blood Pressure: 127/71 mmHg. General Notes: Wound exam; plantar fifth metatarsal head has no open area. There is callus here. She has completed her course of antibiotics. Integumentary (Hair, Skin) Wound #1 status is Healed - Epithelialized. Original cause of wound was Pressure Injury. The wound is located on the Right,Lateral Metatarsal head fifth. The wound measures 0cm length x 0cm width x 0cm depth; 0cm^2 area and 0cm^3 volume. The wound is limited to skin breakdown. There is no tunneling or undermining noted. There is a none present amount of drainage noted. The wound margin is flat and intact. There is no granulation within the wound bed. There is no necrotic tissue within the wound bed. The periwound skin appearance exhibited: Callus, Induration, Dry/Scaly. The periwound skin appearance did not exhibit: Crepitus, Excoriation, Fluctuance, Friable, Localized Edema, Rash, Scarring, Maceration, Moist, Atrophie Blanche, Cyanosis, Ecchymosis, Hemosiderin Staining, Mottled, Pallor, Rubor, Erythema. Periwound temperature was noted as No Abnormality. 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 MD instructed her to keep place offloaded. Wears her diabetic shoes. SHe verbalized understanding. I think the patient can be discharged from the clinic. I've advised her to keep the fifth metatarsal head patted even in her own diabetic shoes. Noteworthy that I have actually never seen her in the shoes. In any case she is ready to be discharged from the clinic, she is to call us back if there are further issues Electronic Signature(s) Signed: 02/25/2016 7:42:49 AM By: Linton Ham MD Entered By: Linton Ham on 02/24/2016 14:45:30 [name] ([id number]) -------------------------------------------------------------------------------- SuperBill Details Patient Name: [name] Date of Service: 02/24/2016 Medical Record Patient Account Number: [id number] Number: Treating RN: Baruch Gouty, RN, BSN, Rita 20-Nov-1950 [phone] y.o. Other Clinician: Date of Birth/Sex: Female) Treating ROBSON, MICHAEL Primary Care Physician/Extender: Verne Spurr Physician: Weeks in Treatment: 8 Referring Physician: Charlott Rakes Diagnosis Coding ICD-10 Codes Code Description E11.621 Type 2 diabetes mellitus with foot ulcer L97.511 Non-pressure chronic ulcer of other part of right foot limited to breakdown of skin E11.51 Type 2 diabetes mellitus with diabetic peripheral angiopathy without gangrene F17.218 Nicotine dependence, cigarettes, with other nicotine-induced disorders Facility Procedures CPT4 Code: [id number] Description: [id number] - WOUND CARE VISIT-LEV 2 EST PT Modifier: Quantity: 1 Physician Procedures CPT4 Code: [id number] Description: [id number] - WC PHYS LEVEL 2 - EST PT ICD-10 Description Diagnosis E11.621 Type 2 diabetes mellitus with foot ulcer Modifier: Quantity: 1 Electronic Signature(s) Signed: 02/25/2016 7:42:49 AM By: Linton Ham MD Entered By: Linton Ham on  02/24/2016 14:46:15

## 2016-03-09 DIAGNOSIS — M1712 Unilateral primary osteoarthritis, left knee: Secondary | ICD-10-CM | POA: Diagnosis not present

## 2016-03-18 ENCOUNTER — Ambulatory Visit (INDEPENDENT_AMBULATORY_CARE_PROVIDER_SITE_OTHER): Payer: Medicare Other | Admitting: Podiatry

## 2016-03-18 ENCOUNTER — Encounter: Payer: Self-pay | Admitting: Podiatry

## 2016-03-18 DIAGNOSIS — L02611 Cutaneous abscess of right foot: Secondary | ICD-10-CM

## 2016-03-18 DIAGNOSIS — L89891 Pressure ulcer of other site, stage 1: Secondary | ICD-10-CM | POA: Diagnosis not present

## 2016-03-18 DIAGNOSIS — L97511 Non-pressure chronic ulcer of other part of right foot limited to breakdown of skin: Secondary | ICD-10-CM

## 2016-03-18 DIAGNOSIS — L03031 Cellulitis of right toe: Secondary | ICD-10-CM

## 2016-03-18 MED ORDER — CEPHALEXIN 500 MG PO CAPS: 500.0000 mg | ORAL_CAPSULE | Freq: Three times a day (TID) | ORAL | 2 refills | Status: DC

## 2016-03-19 NOTE — Progress Notes (Signed)
Subjective: 65 year old female presents the office today for concerns of the calluses  to the right foot submetatarsal 5. She states the callus has worsened and become very thick and causing pain when she walks. Denies any redness or drainage or any swelling. She was discharged in the wound care center wound is healed. Denies any systemic complaints such as fevers, chills, nausea, vomiting. No acute changes since last appointment, and no other complaints at this time.   Objective: AAO x3, NAD DP/PT pulses palpable bilaterally, CRT less than 3 seconds Hyperkeratotic lesion right foot some metatarsal 5. Upon debridement there is a small superficial wound identified and there is no probing, undermining or tunneling. There is minimal surrounding erythema how there is no ascending cellulitis, fluctuance, crepitus, malodor. No drainage or pus is identified today. Previous toe amputation to the first second toe on the right foot. Hammertoe contractures are present. There is no other open lesion or pre-ulcerative lesion identified at this time. No edema, erythema, increase in warmth to bilateral lower extremities.  No open lesions or pre-ulcerative lesions.  No pain with calf compression, swelling, warmth, erythema  Assessment: Ulceration right foot submetatarsal 5  Plan: -All treatment options discussed with the patient including all alternatives, risks, complications.  -Keratotic lesion, ulceration which sharply debrided today without complications to greater wound base. Silvadene was applied followed by dressing. Continue daily dressing changes which showed he has ointment at home. Prescribed Keflex. Monitor for signs or symptoms of worsening infection of the arch any occur. Offloading pads were dispensed. Follow-up in 2 weeks or sooner if needed. -Patient encouraged to call the office with any questions, concerns, change in symptoms.   Celesta Gentile, DPM

## 2016-03-24 DIAGNOSIS — M94262 Chondromalacia, left knee: Secondary | ICD-10-CM | POA: Diagnosis not present

## 2016-03-24 DIAGNOSIS — M23332 Other meniscus derangements, other medial meniscus, left knee: Secondary | ICD-10-CM | POA: Diagnosis not present

## 2016-03-24 DIAGNOSIS — M23222 Derangement of posterior horn of medial meniscus due to old tear or injury, left knee: Secondary | ICD-10-CM | POA: Diagnosis not present

## 2016-03-24 DIAGNOSIS — M659 Synovitis and tenosynovitis, unspecified: Secondary | ICD-10-CM | POA: Diagnosis not present

## 2016-03-24 DIAGNOSIS — M1712 Unilateral primary osteoarthritis, left knee: Secondary | ICD-10-CM | POA: Diagnosis not present

## 2016-03-24 DIAGNOSIS — G8918 Other acute postprocedural pain: Secondary | ICD-10-CM | POA: Diagnosis not present

## 2016-04-04 ENCOUNTER — Encounter: Payer: Self-pay | Admitting: Podiatry

## 2016-04-04 ENCOUNTER — Ambulatory Visit (INDEPENDENT_AMBULATORY_CARE_PROVIDER_SITE_OTHER): Payer: Medicare Other | Admitting: Podiatry

## 2016-04-04 DIAGNOSIS — L84 Corns and callosities: Secondary | ICD-10-CM

## 2016-04-04 DIAGNOSIS — L89891 Pressure ulcer of other site, stage 1: Secondary | ICD-10-CM

## 2016-04-04 DIAGNOSIS — E1149 Type 2 diabetes mellitus with other diabetic neurological complication: Secondary | ICD-10-CM

## 2016-04-04 DIAGNOSIS — L97511 Non-pressure chronic ulcer of other part of right foot limited to breakdown of skin: Secondary | ICD-10-CM

## 2016-04-05 DIAGNOSIS — M1712 Unilateral primary osteoarthritis, left knee: Secondary | ICD-10-CM | POA: Diagnosis not present

## 2016-04-05 DIAGNOSIS — Z4789 Encounter for other orthopedic aftercare: Secondary | ICD-10-CM | POA: Diagnosis not present

## 2016-04-06 NOTE — Progress Notes (Signed)
Subjective: 65 year old female presents the office today for follow-up evaluation of wound, hyperkeratotic lesions of the right foot submetatarsal 5. She states the area becomes painful with the callus builds up and she has not had any redness or drainage or any swelling coming from the area. She has has a thick callus the right second digit attachment. Again denies any drainage. No other complaints at this time. She has not followed the wound care center recently.  Objective: AAO x3, NAD DP/PT pulses palpable bilaterally, CRT less than 3 seconds Hyperkeratotic lesion right foot some metatarsal 5. Upon debridement there continues to be underlying granular superficial wound today measuring approximately 0.8 x 0.5 centimeters there is no probing to bone, undermining or tunneling. No swelling erythema, ascending cellulitis, fluctuance, crepitus, malodor. There is no drainage or pus. Since the hyperkeratotic tissue buildup presents the right second toe. Upon debridement there is no undermining ulceration, drainage or any signs of infection. Under a thick callus there was significant dried blood. There is no underlying wound present. Hammertoe contractures are present.  No other open lesions or pre-ulcerative lesions.  No pain with calf compression, swelling, warmth, erythema  Assessment: Ulceration right foot submetatarsal 5; pre-ulcerative callus right second toe  Plan: -All treatment options discussed with the patient including all alternatives, risks, complications.  -Hyperkeratotic lesion/ulceration was sharply debrided to the right foot down to granular tissue due to medical necessity. Continue daily dressing changes. Now she is antibiotic ointment and a bandage. Monitor for any signs or symptoms of infection to the ER should any occur call the office. Offloading pads for further dispensed. I. She has modified her diabetic shoes however she does wear flip-flops that she feels that her diabetic  shoes are not comfortable. I recommended that she call the place that she got them from for possible modifications. -Pre-ulcerative lesion right second toe was debrided without complications or bleeding. -Follow-up as scheduled or sooner if needed. Call any questions or concerns meantime.  [name], DPM

## 2016-04-11 ENCOUNTER — Telehealth: Payer: Self-pay | Admitting: Dietician

## 2016-04-11 ENCOUNTER — Other Ambulatory Visit: Payer: Self-pay | Admitting: Internal Medicine

## 2016-04-11 DIAGNOSIS — E1142 Type 2 diabetes mellitus with diabetic polyneuropathy: Secondary | ICD-10-CM

## 2016-04-13 NOTE — Telephone Encounter (Signed)
Needs refill on gabapentin, told her it was sent in yesterday  Doing great after knee surgery. Pretty good with diabetes 130-140. She checks her blood sugar every other day or so.  Due to see Dr.Patel, does not want a flu shot , transferred her call to front office to make her appointment and will request triage nurse handle refill request.

## 2016-04-13 NOTE — Telephone Encounter (Signed)
Excellent. Thank you for letting me know.

## 2016-04-25 ENCOUNTER — Ambulatory Visit (INDEPENDENT_AMBULATORY_CARE_PROVIDER_SITE_OTHER): Payer: Medicare Other | Admitting: Podiatry

## 2016-04-25 DIAGNOSIS — L97511 Non-pressure chronic ulcer of other part of right foot limited to breakdown of skin: Secondary | ICD-10-CM | POA: Diagnosis not present

## 2016-04-25 DIAGNOSIS — L089 Local infection of the skin and subcutaneous tissue, unspecified: Secondary | ICD-10-CM

## 2016-04-25 MED ORDER — SILVER SULFADIAZINE 1 % EX CREA: 1.0000 "application " | TOPICAL_CREAM | Freq: Every day | CUTANEOUS | 0 refills | Status: DC

## 2016-04-25 MED ORDER — CEPHALEXIN 500 MG PO CAPS: 500.0000 mg | ORAL_CAPSULE | Freq: Three times a day (TID) | ORAL | 2 refills | Status: DC

## 2016-04-26 DIAGNOSIS — M1712 Unilateral primary osteoarthritis, left knee: Secondary | ICD-10-CM | POA: Diagnosis not present

## 2016-04-26 DIAGNOSIS — Z4789 Encounter for other orthopedic aftercare: Secondary | ICD-10-CM | POA: Diagnosis not present

## 2016-04-29 NOTE — Progress Notes (Signed)
Subjective: 65 year old female presents the office today for follow-up evaluation of wound, hyperkeratotic lesions of the right foot submetatarsal 5. She states the callus on the side of the right foot some metatarsal fibers which is the majority of pain. She denies any swelling redness or drainage. She is concerned however the wound is opening back up. She denies any systemic complaints such as fevers, chills, nausea, vomiting. No Pain, chest pain, shortness of breath.  Objective: AAO x3, NAD DP/PT pulses palpable bilaterally, CRT less than 3 seconds Hyperkeratotic lesion right foot some metatarsal 5. Upon debridement there is an underlying ulceration which does remain measuring about 0.7 x 0.5 cm. There is no probing to bone, undermining or tunneling. There is no surrounding erythema, ascending cellulitis, fluctuance, crepitus. There does appear to be increase in malodor compared to last appointment. There is no pus or drainage expressed today. Hyperkeratotic lesion to the distal aspect of the second toe on the right foot. Appears to have some blood underneath the callus. Upon debridement there is no underlying ulceration or drainage or any swelling. No signs of infection of the toe. No other open lesions identified at today's visit.  No pain with calf compression, swelling, warmth, erythema  Assessment: Ulceration right foot submetatarsal 5; pre-ulcerative callus right second toe  Plan: -All treatment options discussed with the patient including all alternatives, risks, complications.  -Wound debridement was carried out today utilizing a scalpel as well as tissue nipper to the right fifth toe to remove the hyperkeratotic tissue down to granular healthy wound base. Offloading pad was placed in her shoe. She has lost her diabetic shoes. I discussed with her other shoe gear changes. Continue with Silvadene dressing changes daily. We'll start Keflex. Both of these prescriptions were sent the  pharmacy. -Second toe hyperkeratotic lesion was debrided so without complications or bleeding. Offloading pads. Monitoring signs or symptoms of worsening infection to the ER immediately should any occur.- -Follow-up as scheduled or sooner if needed. Call any questions or concerns meantime.  Celesta Gentile, DPM

## 2016-05-07 ENCOUNTER — Emergency Department (HOSPITAL_COMMUNITY)
Admission: EM | Admit: 2016-05-07 | Discharge: 2016-05-08 | Disposition: A | Discharge status: Home / Self Care | Payer: Medicare Other | Attending: Emergency Medicine | Admitting: Emergency Medicine

## 2016-05-07 ENCOUNTER — Encounter (HOSPITAL_COMMUNITY): Payer: Self-pay | Admitting: *Deleted

## 2016-05-07 ENCOUNTER — Emergency Department (HOSPITAL_COMMUNITY): Payer: Medicare Other

## 2016-05-07 DIAGNOSIS — Z8673 Personal history of transient ischemic attack (TIA), and cerebral infarction without residual deficits: Secondary | ICD-10-CM | POA: Diagnosis not present

## 2016-05-07 DIAGNOSIS — Z79899 Other long term (current) drug therapy: Secondary | ICD-10-CM | POA: Diagnosis not present

## 2016-05-07 DIAGNOSIS — J449 Chronic obstructive pulmonary disease, unspecified: Secondary | ICD-10-CM | POA: Insufficient documentation

## 2016-05-07 DIAGNOSIS — M79671 Pain in right foot: Secondary | ICD-10-CM | POA: Insufficient documentation

## 2016-05-07 DIAGNOSIS — I509 Heart failure, unspecified: Secondary | ICD-10-CM | POA: Diagnosis not present

## 2016-05-07 DIAGNOSIS — Z7984 Long term (current) use of oral hypoglycemic drugs: Secondary | ICD-10-CM | POA: Insufficient documentation

## 2016-05-07 DIAGNOSIS — E1140 Type 2 diabetes mellitus with diabetic neuropathy, unspecified: Secondary | ICD-10-CM | POA: Diagnosis not present

## 2016-05-07 DIAGNOSIS — E11621 Type 2 diabetes mellitus with foot ulcer: Secondary | ICD-10-CM | POA: Diagnosis not present

## 2016-05-07 DIAGNOSIS — Z7982 Long term (current) use of aspirin: Secondary | ICD-10-CM | POA: Diagnosis not present

## 2016-05-07 DIAGNOSIS — I110 Hypertensive heart disease with heart failure: Secondary | ICD-10-CM | POA: Insufficient documentation

## 2016-05-07 DIAGNOSIS — F17210 Nicotine dependence, cigarettes, uncomplicated: Secondary | ICD-10-CM | POA: Insufficient documentation

## 2016-05-07 DIAGNOSIS — L97519 Non-pressure chronic ulcer of other part of right foot with unspecified severity: Secondary | ICD-10-CM | POA: Diagnosis not present

## 2016-05-07 MED ORDER — HYDROCODONE-ACETAMINOPHEN 5-325 MG PO TABS
1.0000 | ORAL_TABLET | Freq: Once | ORAL | Status: AC
  Administered 2016-05-07: 1 via ORAL
  Filled 2016-05-07: qty 1

## 2016-05-07 NOTE — ED Provider Notes (Signed)
[facility] DEPT Provider Note   CSN: [id number] Arrival date & time: 05/07/16  1827     History   Chief Complaint Chief Complaint  Patient presents with  . Foot Pain    HPI [name] is a 65 y.o. female.  HPI   Patient is a 64 year old female with history of type 2 diabetes, HTN, diabetic neuropathy, hx of amputation of right great toe who presents the emergency department with top of her right foot pain that started today. Patient states its a sensation of fire. She states the pain is 5/10 at rest, nonradiating, worse with walking or touch then the pain is 10/10. Associated sore on the tip of her second toe right foot into her lateral aspect of her proximal fifth metatarsal. She states the pain appears worse over the amputated area. She is taking her gabapentin without relief. Patient denies fever, chills, new or worsening numbness or tingling, or other associated symptoms.  Past Medical History:  Diagnosis Date  . Anemia   . Arthritis    "left knee" (09/08/2015)  . Brachial plexus disorders   . CHF (congestive heart failure) ([place])   . Chronic pain    neck pain, headache, neuropathy  . COPD (chronic obstructive pulmonary disease) ([place])   . Depression    "when my husband passed in 2013"  . GERD (gastroesophageal reflux disease)   . Hypertension   . Hypertriglyceridemia   . Migraine    "monthly" (09/08/2015)  . Neuromuscular disorder ([place])   . Puncture wound of foot, right 05/17/2012   Tetanus shot 3 yrs ago at [facility] in [place], per pt report   . Type II diabetes mellitus ([place])    diagnosed around 2010, only ever on metformin    Patient Active Problem List   Diagnosis Date Noted  . Diabetic ulcer of right foot ([place]) 01/01/2016  . Onychomycosis of multiple toenails with type 2 diabetes mellitus ([place]) 11/04/2015  . Skin lesions 11/04/2015  . Screening for colon cancer 11/04/2015  . Hematoma of scalp 09/18/2015  . Orthostatic syncope   . Sinusitis  06/19/2015  . Obesity (BMI 30.0-34.9) 05/27/2015  . Fatigue 05/11/2015  . Leg ulcer, left ([place]) 03/05/2015  . Recurrent falls 03/05/2015  . Primary osteoarthritis of left knee 09/22/2014  . Esophageal reflux 07/18/2014  . Hyperlipidemia associated with type 2 diabetes mellitus ([place]) 02/26/2014  . Tobacco use disorder 07/22/2013  . Headache 06/28/2013  . Diabetic peripheral neuropathy associated with type 2 diabetes mellitus ([place]) 06/14/2013  . Hx-TIA (transient ischemic attack) 03/10/2013  . Depression, major, recurrent ([place]) 12/13/2012  . Health care maintenance 12/13/2012  . Diastolic dysfunction 99991111  . Diabetes mellitus type 2, controlled, with complications ([place]) [id number]  . Hypertension 05/13/2012    Past Surgical History:  Procedure Laterality Date  . AMPUTATION Right 05/01/2015   Procedure: Right Foot 1st Ray Amputation;  Surgeon: Newt Minion, MD;  Location: [facility];  Service: Orthopedics;  Laterality: Right;  . BILATERAL SALPINGOOPHORECTOMY Bilateral 1986   "after hemtoma evacuations; had to cut me open"  . CARPAL TUNNEL RELEASE Bilateral   . DILATION AND CURETTAGE OF UTERUS  ~ 1982   S/P miscarriage  . HEMATOMA EVACUATION Right 1986 x 2   "OVARY; w/in 1 wk after hysterectomy"  . LAPAROSCOPIC CHOLECYSTECTOMY    . SHOULDER ARTHROSCOPY W/ ROTATOR CUFF REPAIR Bilateral   . TONSILLECTOMY    . TUBAL LIGATION    . VAGINAL HYSTERECTOMY  1986    OB History  No data available       Home Medications    Prior to Admission medications   Medication Sig Start Date End Date Taking? Authorizing Provider  albuterol (PROVENTIL HFA;VENTOLIN HFA) 108 (90 BASE) MCG/ACT inhaler Inhale 1-2 puffs into the lungs every 6 (six) hours as needed for wheezing or shortness of breath. 01/24/14   Riccardo Dubin, MD  amLODipine (NORVASC) 2.5 MG tablet Take 1 tablet (2.5 mg total) by mouth daily. 05/10/16   Riccardo Dubin, MD  amLODipine (NORVASC) 5 MG tablet Take 0.5 tablets (2.5 mg  total) by mouth daily. 10/16/15 10/15/16  Riccardo Dubin, MD  aspirin 81 MG tablet Take 81 mg by mouth every morning.     Historical Provider, MD  aspirin-acetaminophen-caffeine (EXCEDRIN MIGRAINE) [phone] MG tablet Take 2 tablets by mouth every 6 (six) hours as needed for headache.    Historical Provider, MD  butalbital-acetaminophen-caffeine (FIORICET, ESGIC) [phone] MG tablet Take 1 tablet by mouth at bedtime as needed for headache. 11/04/15   Riccardo Dubin, MD  cephALEXin (KEFLEX) 500 MG capsule Take 1 capsule (500 mg total) by mouth 3 (three) times daily. 03/18/16   Trula Slade, DPM  cephALEXin (KEFLEX) 500 MG capsule Take 1 capsule (500 mg total) by mouth 3 (three) times daily. 04/25/16   Trula Slade, DPM  doxycycline (VIBRAMYCIN) 100 MG capsule Take 1 capsule (100 mg total) by mouth 2 (two) times daily. 12/25/15   Roxanna Mew, PA-C  DULoxetine (CYMBALTA) 60 MG capsule Take 1 capsule (60 mg total) by mouth every morning. 05/11/15   Florinda Marker, MD  gabapentin (NEURONTIN) 600 MG tablet take 2 tablets by mouth three times a day 04/12/16   Riccardo Dubin, MD  glipiZIDE (GLUCOTROL XL) 10 MG 24 hr tablet Take 1 tablet (10 mg total) by mouth daily with supper. 08/18/15   Nischal Narendra, MD  glucose blood (GLUCOSE METER TEST) test strip Use as instructed 08/04/15   Lucious Groves, DO  lisinopril (PRINIVIL,ZESTRIL) 40 MG tablet take 1 tablet by mouth once daily 06/17/15   Riccardo Dubin, MD  metFORMIN (GLUCOPHAGE-XR) 750 MG 24 hr tablet take 2 tablets by mouth once daily WITH BREAKFAST 02/04/16   Riccardo Dubin, MD  pantoprazole (PROTONIX) 20 MG tablet Take 1 tablet (20 mg total) by mouth daily as needed. Patient taking differently: Take 20 mg by mouth daily.  11/10/15   Riccardo Dubin, MD  rosuvastatin (CRESTOR) 40 MG tablet Take 1 tablet (40 mg total) by mouth daily. 07/10/15   Riccardo Dubin, MD  silver sulfADIAZINE (SILVADENE) 1 % cream Apply 1 application topically daily. 04/25/16    Trula Slade, DPM  sitaGLIPtin (JANUVIA) 100 MG tablet Take 1 tablet (100 mg total) by mouth daily. 12/02/15   Riccardo Dubin, MD    Family History Family History  Problem Relation Age of Onset  . Hyperlipidemia Mother   . Heart attack Father 67  . Hypertension Father   . Cancer Paternal Grandfather     Lung cancer  . Cancer Maternal Grandmother   . Heart attack Maternal Grandmother     Social History Social History  Substance Use Topics  . Smoking status: Current Every Day Smoker    Packs/day: 0.50    Years: 47.00    Types: Cigarettes  . Smokeless tobacco: Never Used     Comment: less than 1/2 pack per day  . Alcohol use No     Allergies  Flexeril [cyclobenzaprine] and Soma [carisoprodol]   Review of Systems Review of Systems  Constitutional: Negative for chills and fever.  Gastrointestinal: Negative for abdominal pain, nausea and vomiting.  Musculoskeletal: Positive for arthralgias.  Skin: Positive for color change and wound.  All other systems reviewed and are negative.    Physical Exam Updated Vital Signs BP 136/76   Pulse 77   Temp 98.4 F (36.9 C) (Oral)   Resp 19   SpO2 97%   Physical Exam  Constitutional: She appears well-developed and well-nourished. No distress.  HENT:  Head: Normocephalic and atraumatic.  Eyes: Conjunctivae are normal.  Cardiovascular: Normal rate, regular rhythm and normal heart sounds.  Exam reveals no gallop and no friction rub.   No murmur heard. Pulmonary/Chest: Effort normal. No respiratory distress.  Musculoskeletal: Normal range of motion.  Black eschar ulcer noted to tip of 2nd toe of right foot with mild surrounding erythema and edema, TTP. Ulcer noted to lateral aspect of distal 5th metatarsal with central black eschar, no surrounding erythema or edema. No swelling noted to foot. TTP to top of foot. Decreased sensation at baseline. 2+ PT and DP pulses bilaterally.   Neurological: She is alert. Coordination  normal.  Skin: Skin is warm and dry. She is not diaphoretic.  Psychiatric: She has a normal mood and affect. Her behavior is normal.  Nursing note and vitals reviewed.    ED Treatments / Results  Labs (all labs ordered are listed, but only abnormal results are displayed) Labs Reviewed - No data to display  EKG  EKG Interpretation None       Radiology CLINICAL DATA: Pain. Diabetic ulcer. History of diabetes and  amputation.    EXAM:  CT OF THE RIGHT FOOT WITHOUT CONTRAST    TECHNIQUE:  Multidetector CT imaging of the right foot was performed according  to the standard protocol. Multiplanar CT image reconstructions were  also generated.    COMPARISON: Right foot radiographs 02/17/2016    FINDINGS:  Bones/Joint/Cartilage    Previous amputation of the right first ray at the tarsometatarsal  level. Degenerative changes in the intertarsal joints. No definite  evidence of any cortical erosion or bone sclerosis to suggest  osteomyelitis. MRI would be more sensitive for this determination if  clinically indicated. No evidence of acute fracture or dislocation.    Ligaments    Suboptimally assessed by CT.    Muscles and Tendons    Diffuse muscle atrophy.    Soft tissues    Diffuse soft tissue swelling and edema over the plantar aspect of  the foot. No loculated fluid collections identified.    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 Diffuse plantar soft tissue swelling. No  definite evidence of osteomyelitis although MRI would be more  sensitive for this determination if clinically indicated.      Electronically Signed  By: Lucienne Capers M.D.  On: 05/07/2016 23:17     Procedures Procedures (including critical care time)  Medications Ordered in ED Medications  HYDROcodone-acetaminophen (NORCO/VICODIN) 5-325 MG per tablet 1 tablet (1 tablet Oral Given 05/07/16 2325)     Initial  Impression / Assessment and Plan / ED Course  I have reviewed the triage vital signs and the nursing notes.  Pertinent labs & imaging results that were available during my care of the patient were reviewed by me and considered in my medical decision making (see chart for details).  Clinical Course    Pt with  new right foot pain. Exam reveals 2 ulcers. Pt is diabetic. CT scan not concerning for osteomyelitis and exam less concerning for cellulitis. Patient afebrile, VSS, no systemic symptoms. Instructed patient to follow-up with her podiatrist on Monday. Discussed strict return precautions to the ED. I discussed all of the results with the patient she expressed her understanding to the verbal discharge instructions.  Shared case with Dr. Leonette Monarch who saw the pt and agrees with the above plan.  Final Clinical Impressions(s) / ED Diagnoses   Final diagnoses:  Foot pain, right    New Prescriptions Discharge Medication List as of 05/08/2016 12:08 AM       Kalman Drape, PA 05/10/16 [place], MD 05/13/16 [phone]

## 2016-05-07 NOTE — ED Triage Notes (Signed)
Reports being diabetic and has burning pain to entire right foot. Had wound to that foot in past but appears to be healing well. No redness, drainage or foul odor noted at triage.

## 2016-05-07 NOTE — ED Notes (Signed)
Pt ambulated to bathroom inside of room.

## 2016-05-08 NOTE — Discharge Instructions (Signed)
Follow-up with your primary care provider and podiatrist on Monday. The emergency department with worsening symptoms or any new concerning symptoms.

## 2016-05-10 ENCOUNTER — Other Ambulatory Visit: Payer: Self-pay | Admitting: Internal Medicine

## 2016-05-10 DIAGNOSIS — I10 Essential (primary) hypertension: Secondary | ICD-10-CM

## 2016-05-10 NOTE — Telephone Encounter (Signed)
As this patient was seen by me and deemed necessary for their treatment, I will refill this prescription for amlodipine 2.5 mg. I have changed her prescription.

## 2016-05-10 NOTE — Telephone Encounter (Signed)
Previous refill order class was set to "print", which means pharmacy never received rx.  Will send to pcp for review, please advise.Regenia Skeeter, Thena Devora Cassady11/28/20178:34 AM

## 2016-05-16 ENCOUNTER — Encounter: Payer: Self-pay | Admitting: Podiatry

## 2016-05-16 ENCOUNTER — Ambulatory Visit (INDEPENDENT_AMBULATORY_CARE_PROVIDER_SITE_OTHER): Payer: Medicare Other | Admitting: Podiatry

## 2016-05-16 DIAGNOSIS — L84 Corns and callosities: Secondary | ICD-10-CM | POA: Diagnosis not present

## 2016-05-16 DIAGNOSIS — L02611 Cutaneous abscess of right foot: Secondary | ICD-10-CM

## 2016-05-16 DIAGNOSIS — L03031 Cellulitis of right toe: Secondary | ICD-10-CM

## 2016-05-16 DIAGNOSIS — E1149 Type 2 diabetes mellitus with other diabetic neurological complication: Secondary | ICD-10-CM

## 2016-05-16 MED ORDER — CLINDAMYCIN HCL 300 MG PO CAPS: 300.0000 mg | ORAL_CAPSULE | Freq: Three times a day (TID) | ORAL | 2 refills | Status: DC

## 2016-05-16 MED ORDER — HYDROCODONE-ACETAMINOPHEN 5-325 MG PO TABS: 1.0000 | ORAL_TABLET | ORAL | 0 refills | Status: DC | PRN

## 2016-05-16 NOTE — Progress Notes (Addendum)
Subjective: 65 year old female presents the office today for follow-up evaluation of wound, hyperkeratotic lesions of the right foot submetatarsal 5. He states that over the week and she was having quite a bit of pain to the area is joint to the emergency room. While in the emergency room she had a CT scan which was negative for osteomyelitis. She is not currently taking any antibiotics. She states the area still painful to pressure in shoe gear as worsened since I last saw her. She denies any swelling redness or red streaks. Denies any drainage or pus coming from the area. She still gets a thick hyperkeratotic lesion to the distal portion of the second toe but denies any redness or swelling. She denies any systemic complaints as fevers, chills, nausea, vomiting. No calf pain, chest pain, shortness of breath. No other complaints at this time.  Objective: AAO x3, NAD DP/PT pulses palpable bilaterally, CRT less than 3 seconds Hyperkeratotic lesion right foot some metatarsal 5. Upon debridement there is an underlying ulceration with a small amount of superficial purulence identified. This was cultured today. The wound today measures 1.5 x 1.5 [place] granular/fibrotic wound base. There is no probing to bone, undermining or tunneling. There is a depth today for possible 0.4 cm which has worsened.there is minimal edema and a faint rim of erythema around the area but there is no ascending cellulitis. There is no flex was a crepitus or any malodor otherwise. The distal portion of the right second toe is a thick hyperkeratotic lesion with evidence of dried blood on the callus. Upon debridement this area is pre-ulcerative how there is no definitive ulceration at this time. There is no edema, erythema. No other open lesions identified at today's visit.  No pain with calf compression, swelling, warmth, erythema  Assessment: Ulceration right foot submetatarsal 5; pre-ulcerative callus right second toe  Plan: -All  treatment options discussed with the patient including all alternatives, risks, complications.  -Wound to the right foot was debrided today.we will change to a calcium alginate dressing and this is ordered today. [facility]. Surgical shoe was dispensed offloading pads. Clindamycin prescribed. I discussed with her to monitor closely for any signs or symptoms of worsening infection to the ER should any occur. I discussed that if the wound worsens she is likely going to need a transmetatarsal amputation and she is aware that this is a risk and she's been told this before she states. Follow-up wound culture. -Pre-ulcerative lesion of the right second toe is debrided without any complications. Offloading to the area. -Follow up in 1 week or sooner if any need questions are to arise or if needed. In the meantime call any questions or concerns or any changes symptoms.  Celesta Gentile, DPM

## 2016-05-17 ENCOUNTER — Telehealth: Payer: Self-pay | Admitting: *Deleted

## 2016-05-17 NOTE — Telephone Encounter (Deleted)
Entered in error

## 2016-05-18 DIAGNOSIS — M1712 Unilateral primary osteoarthritis, left knee: Secondary | ICD-10-CM | POA: Diagnosis not present

## 2016-05-18 DIAGNOSIS — Z4789 Encounter for other orthopedic aftercare: Secondary | ICD-10-CM | POA: Diagnosis not present

## 2016-05-18 NOTE — Telephone Encounter (Deleted)
-----   Message from Trula Slade, DPM sent at 05/17/2016  4:50 PM EST ----- Silver alginate  ----- Message ----- From: Andres Ege, RN Sent: 05/17/2016   4:24 PM To: Trula Slade, DPM  Dr. Jacqualyn Posey, you didn't check a product for [facility] order and there is no dictation yet for yesterday. Marcy Siren

## 2016-05-18 NOTE — Telephone Encounter (Addendum)
Dr. Jacqualyn Posey ordered Calcium Alginate with silver for Right sub 5th ulcer L03.031 and L02.611, measuring 1.5 x 1.5 x 0.3cm with moderate drainage. Orders faxed to [facility]. 06/14/2016-April - [facility] states wound culture results of 05/16/2016 are available and will be faxed in 15 minutes. 06/21/2016-Pt states the surgical shoe on the right side is causing the left knee to hurt because of her legs being uneven. I told pt she could pick up a surgical shoe at the [place] office and we could [employer], and what it did not cover we would bill her. Pt states understanding. Pt presents to the office for surgical shoe for the left foot, due pain in the left knee due to uneven leg length. 07/04/2016-Pt states her dressing felt tight,so she took the ace wrap off, and there was a spot of blood at the surgery site that was not there Friday. I spoke with pt and she said she didn't have the spot previously and couldn't tell if the area was wet. I transferred pt to schedulers to get an appt in the next day.

## 2016-05-23 ENCOUNTER — Encounter: Payer: Self-pay | Admitting: Podiatry

## 2016-05-23 ENCOUNTER — Ambulatory Visit (INDEPENDENT_AMBULATORY_CARE_PROVIDER_SITE_OTHER): Payer: Medicare Other | Admitting: Podiatry

## 2016-05-23 DIAGNOSIS — Q828 Other specified congenital malformations of skin: Secondary | ICD-10-CM | POA: Diagnosis not present

## 2016-05-23 DIAGNOSIS — L97511 Non-pressure chronic ulcer of other part of right foot limited to breakdown of skin: Secondary | ICD-10-CM

## 2016-05-23 DIAGNOSIS — E1149 Type 2 diabetes mellitus with other diabetic neurological complication: Secondary | ICD-10-CM | POA: Diagnosis not present

## 2016-05-23 DIAGNOSIS — L84 Corns and callosities: Secondary | ICD-10-CM

## 2016-05-25 ENCOUNTER — Telehealth: Payer: Self-pay | Admitting: Internal Medicine

## 2016-05-25 NOTE — Telephone Encounter (Signed)
I received paperwork from [facility] on 05/23/16 requesting pre-operative clearance for left knee arthroplasty. I had performed this assessment previously on 03/19/15, but the procedure was deferred due to her foot infection.   I have not seen her since July 2017, and she was to follow-up with me in 3 months which she did not.   Can we schedule her in [facility] to ensure nothing has changed in the way of her diabetes or foot infection? She also smokes and will need to refrain from smoking prior to her procedure.  Thank you.

## 2016-05-26 NOTE — Telephone Encounter (Signed)
Lm for rtc for appt

## 2016-05-30 NOTE — Progress Notes (Signed)
Subjective: 65 year old female presents the office today for concerns of hyperkeratotic tissue/wound to the right foot submetatarsal 5. She states that she has not gotten the wound care supplies were ordered with the comminuted contact her she is just received them in the nail yet. She states the areas still painful. She denies any redness or drainage or any swelling to the area. Overall she feels the wound has gotten somewhat smaller but it is still painful.  Denies any systemic complaints such as fevers, chills, nausea, vomiting. No acute changes since last appointment, and no other complaints at this time.   Objective: AAO x3, NAD DP/PT pulses palpable bilaterally, CRT less than 3 seconds Right foot [place] 5 hyperkeratotic lesion. Upon debridement small wound still remains a proximal 0. x 0.3 cm. There is no probing, undermining or tunneling. There is no swelling erythema, ascending cellulitis, fluctuance, crepitus, malodor. There is tenderness to palpation to this area. Pre-ulcerative lesion to the distal aspect of the second toe nurse thick hyperkeratotic tissue with evidence of dried blood underneath the callus. This appears to be improved compared to last appointment after debridement there is no underlying ulceration, drainage or any signs of infection.  No other open lesions or pre-ulcerative lesions.  No pain with calf compression, swelling, warmth, erythema  Assessment: Healing wound right second metatarsal 5, pre-ulcerative callus right second toe  Plan: -All treatment options discussed with the patient including all alternatives, risks, complications.  -Wound of the right foot symmetry is a fibro-sharply debrided greater tissue. Recommended continue daily dressing changes as discussed for now continue antibiotic ointment to the wound until she is the wound care supplies. Continue offloading at all times of the surgical shoe and pads. We'll hold off on MRI she had a recent CT scan  which was negative for osteomyelitis. However we will re-x-ray next appointment. -Pre-ulcerative callus debrided with 2 without complications or bleeding to the second digit. -Patient encouraged to call the office with any questions, concerns, change in symptoms.   Celesta Gentile, DPM

## 2016-05-31 ENCOUNTER — Other Ambulatory Visit: Payer: Self-pay | Admitting: Dietician

## 2016-05-31 DIAGNOSIS — E1142 Type 2 diabetes mellitus with diabetic polyneuropathy: Secondary | ICD-10-CM

## 2016-05-31 MED ORDER — GLUCOSE BLOOD VI STRP: ORAL_STRIP | 12 refills | Status: DC

## 2016-05-31 MED ORDER — ONETOUCH DELICA LANCETS FINE MISC: 12 refills | Status: DC

## 2016-05-31 MED ORDER — ONETOUCH ULTRA MINI W/DEVICE KIT: PACK | 1 refills | Status: DC

## 2016-05-31 NOTE — Telephone Encounter (Signed)
Ordered.  Agree with plan.

## 2016-05-31 NOTE — Telephone Encounter (Signed)
Lost her meter. Requests a prescription be sent to try to get a new one at her pharmacy. If unable to get her insurance to cover at the pharmacy she can get one here when she comes to see her doctor this Friday. She agrees to this plan.

## 2016-06-01 NOTE — Telephone Encounter (Signed)
Pt has an appt on 12/22 per EPIC.

## 2016-06-03 ENCOUNTER — Ambulatory Visit (INDEPENDENT_AMBULATORY_CARE_PROVIDER_SITE_OTHER): Payer: Medicare Other | Admitting: Internal Medicine

## 2016-06-03 ENCOUNTER — Encounter: Payer: Self-pay | Admitting: Internal Medicine

## 2016-06-03 ENCOUNTER — Other Ambulatory Visit: Payer: Self-pay | Admitting: Dietician

## 2016-06-03 VITALS — BP 123/75 | HR 89 | Temp 98.2°F | Ht 66.0 in | Wt 194.3 lb

## 2016-06-03 DIAGNOSIS — E119 Type 2 diabetes mellitus without complications: Secondary | ICD-10-CM | POA: Diagnosis not present

## 2016-06-03 DIAGNOSIS — Z7984 Long term (current) use of oral hypoglycemic drugs: Secondary | ICD-10-CM

## 2016-06-03 DIAGNOSIS — I10 Essential (primary) hypertension: Secondary | ICD-10-CM | POA: Diagnosis not present

## 2016-06-03 DIAGNOSIS — Z89411 Acquired absence of right great toe: Secondary | ICD-10-CM | POA: Diagnosis not present

## 2016-06-03 DIAGNOSIS — E118 Type 2 diabetes mellitus with unspecified complications: Secondary | ICD-10-CM

## 2016-06-03 DIAGNOSIS — Z79899 Other long term (current) drug therapy: Secondary | ICD-10-CM

## 2016-06-03 DIAGNOSIS — L84 Corns and callosities: Secondary | ICD-10-CM | POA: Diagnosis not present

## 2016-06-03 DIAGNOSIS — F17210 Nicotine dependence, cigarettes, uncomplicated: Secondary | ICD-10-CM | POA: Diagnosis not present

## 2016-06-03 LAB — POCT GLYCOSYLATED HEMOGLOBIN (HGB A1C): HEMOGLOBIN A1C: 7.6

## 2016-06-03 LAB — GLUCOSE, CAPILLARY: GLUCOSE-CAPILLARY: 166 mg/dL — AB (ref 65–99)

## 2016-06-03 MED ORDER — AMLODIPINE BESYLATE 5 MG PO TABS: 5.0000 mg | ORAL_TABLET | Freq: Every day | ORAL | 11 refills | Status: DC

## 2016-06-03 MED ORDER — GLUCOSE BLOOD VI STRP: ORAL_STRIP | 12 refills | Status: DC

## 2016-06-03 MED ORDER — ONETOUCH VERIO W/DEVICE KIT: 1.0000 | PACK | Freq: Three times a day (TID) | 0 refills | Status: DC

## 2016-06-03 NOTE — Progress Notes (Signed)
   CC: diabetes  HPI:  [name] is a 65 y.o. with PMHx as outlined below who presents to clinic for diabetes follow up. Please see problem list for further details of patient's chronic medical issues.   Of note, pt's left knee arthroplasty has been delayed until she can have her teeth w/ caries removed.   Past Medical History:  Diagnosis Date  . Anemia   . Arthritis    "left knee" (09/08/2015)  . Brachial plexus disorders   . CHF (congestive heart failure) ([place])   . Chronic pain    neck pain, headache, neuropathy  . COPD (chronic obstructive pulmonary disease) ([place])   . Depression    "when my husband passed in 2013"  . GERD (gastroesophageal reflux disease)   . Hypertension   . Hypertriglyceridemia   . Migraine    "monthly" (09/08/2015)  . Neuromuscular disorder ([place])   . Puncture wound of foot, right 05/17/2012   Tetanus shot 3 yrs ago at [facility] in [place], per pt report   . Type II diabetes mellitus ([place])    diagnosed around 2010, only ever on metformin    Review of Systems:  Denies tingling, shaking, dizziness. Having rt foot pain.   Physical Exam:  Vitals:   06/03/16 0948  BP: 123/75  Pulse: 89  Temp: 98.2 F (36.8 C)  TempSrc: Oral  SpO2: 100%  Weight: 194 lb 4.8 oz (88.1 kg)  Height: 5\' 6"  (1.676 m)   Physical Exam  Constitutional: appears well-developed and well-nourished. No distress.  HENT:  Head: Normocephalic and atraumatic. Poor dentition Nose: Nose normal.  Cardiovascular: Normal rate, regular rhythm and normal heart sounds.  Exam reveals no gallop and no friction rub.   No murmur heard. Pulmonary/Chest: Effort normal and breath sounds normal. No respiratory distress.  has no wheezes.no rales.  Abdominal: Soft. Bowel sounds are normal.  exhibits no distension. There is no tenderness. There is no rebound and no guarding.  Skin: rt foot missing 1st digit. Lateral side of foot has a callous with hyperpigmentation in the center that is TTP  w/o fluctuance beneath it. Rt foot 2nd digit has a hardened callous about it w/o drainage.    Assessment & Plan:   See Encounters Tab for problem based charting.  Patient discussed with Dr. Daryll Drown

## 2016-06-03 NOTE — Patient Instructions (Signed)
I have sent in a refill for norvasc 5mg . If you notice low blood pressure or feel light headed call the clinic.

## 2016-06-03 NOTE — Assessment & Plan Note (Signed)
BP Readings from Last 3 Encounters:  06/03/16 123/75  05/08/16 138/74  01/01/16 111/64    Lab Results  Component Value Date   NA 143 12/25/2015   K 4.4 12/25/2015   CREATININE 0.70 12/25/2015    Assessment: Blood pressure control:  controlled Progress toward BP goal:   at goal Comments: pt requesting to increase dose of norvasc to 5mg  b/c she has been feeling fatigued since starting decreased dose of 2.5mg . She is unsure why dose was decreased but states it was like that when she refilled her medication.  Plan: Medications:  increased norvasc to 5mg  Other plans: educated on sx of hypotension and return precautions given. Has f/u appt w/ PCP in 1-2 months.

## 2016-06-03 NOTE — Telephone Encounter (Signed)
Saw patient while in the office today: a meter was going to cost her 20$ and she was having trouble getting the strips. Provided her with a sample One touch Verio meter today with 10 strips. She needs different strips for this meter but the same lancets.  Papers from pharmacy completed and waiting for doctor to sign for lancets. She'll need a new one for the strips

## 2016-06-03 NOTE — Assessment & Plan Note (Signed)
Lab Results  Component Value Date   HGBA1C 7.6 06/03/2016   HGBA1C 7.9 01/01/2016   HGBA1C 8.0 09/17/2015     Assessment: Diabetes control:  almost controlled Progress toward A1C goal:   near goal  Comments: on metformin 750, glipizide 10, januvia 100mg . She has not been checking her CBGs b/c she has not had glucometer. Denies hypoglycemic sx.   Plan: Medications:  continue current medications Instruction/counseling given: reminded to bring blood glucose meter & log to each visit Other plans: given new glucometer by [facility]. F/u in 3 months for a1c. Discussed weight loss, exercise, and diet.

## 2016-06-03 NOTE — Telephone Encounter (Signed)
Patient called reminding [place] that she'll need a new prescription for her new meter.  called to notify that it had been changed, but the extra documentation for increased testing is likely to still be required by the pharmacy before she can get more strips.

## 2016-06-08 NOTE — Progress Notes (Signed)
[facility] Clinic Attending  Case discussed with Dr. Truong soon after the resident saw the patient.  We reviewed the resident's history and exam and pertinent patient test results.  I agree with the assessment, diagnosis, and plan of care documented in the resident's note. 

## 2016-06-09 ENCOUNTER — Ambulatory Visit: Payer: Medicare Other | Admitting: Podiatry

## 2016-06-09 ENCOUNTER — Encounter: Payer: Self-pay | Admitting: Dietician

## 2016-06-14 NOTE — Telephone Encounter (Deleted)
-----   Message from Trula Slade, DPM sent at 06/14/2016  8:37 AM EST ----- Can you check to see if we ever got culture results for her? I don't remember if I ever saw them. Thanks.

## 2016-06-16 ENCOUNTER — Telehealth: Payer: Self-pay | Admitting: *Deleted

## 2016-06-16 NOTE — Telephone Encounter (Addendum)
-----   Message from Trula Slade, DPM sent at 06/14/2016  4:39 PM EST ----- Val- can you please start cipro for concerns of E. Coli on her culture that I just got on her. If the wound has healed then no need to prescribe but if still open then please do ciprofloxacin 500mg  BID x 2 weeks. Thank you. 06/16/2016-Unable to leave message, phone message states "at subscriber's request this phone does not accept in coming calls." 06/20/2016-Informed pt of Dr. Leigh Aurora orders and asked if there was an alternate phone for her and she stated "no".

## 2016-06-20 ENCOUNTER — Ambulatory Visit (INDEPENDENT_AMBULATORY_CARE_PROVIDER_SITE_OTHER): Payer: Medicare Other

## 2016-06-20 ENCOUNTER — Ambulatory Visit (INDEPENDENT_AMBULATORY_CARE_PROVIDER_SITE_OTHER): Payer: Medicare Other | Admitting: Podiatry

## 2016-06-20 ENCOUNTER — Encounter: Payer: Self-pay | Admitting: Podiatry

## 2016-06-20 ENCOUNTER — Other Ambulatory Visit: Payer: Self-pay | Admitting: Internal Medicine

## 2016-06-20 VITALS — BP 107/59 | HR 86 | Resp 18

## 2016-06-20 DIAGNOSIS — E1149 Type 2 diabetes mellitus with other diabetic neurological complication: Secondary | ICD-10-CM

## 2016-06-20 DIAGNOSIS — M216X1 Other acquired deformities of right foot: Secondary | ICD-10-CM

## 2016-06-20 DIAGNOSIS — L84 Corns and callosities: Secondary | ICD-10-CM

## 2016-06-20 MED ORDER — CIPROFLOXACIN HCL 500 MG PO TABS: 500.0000 mg | ORAL_TABLET | Freq: Two times a day (BID) | ORAL | 0 refills | Status: DC

## 2016-06-20 NOTE — Telephone Encounter (Signed)
I did not get a chance to call her from the office today to inquire for which medication she is requesting a refill specifically. Please let me know if you get a chance to speak with her before me tomorrow morning.

## 2016-06-20 NOTE — Telephone Encounter (Signed)
Patietn requesting a different medication than the  sitaGLIPtin (JANUVIA) 100 MG tablet

## 2016-06-20 NOTE — Patient Instructions (Signed)

## 2016-06-21 ENCOUNTER — Telehealth: Payer: Self-pay | Admitting: *Deleted

## 2016-06-21 NOTE — Progress Notes (Addendum)
Subjective: 66 year old female presents the office today for concerns of hyperkeratotic tissue/wound to the right foot submetatarsal 5. She states the area continues to be very painful and she cannot wear a regular shoe due to pain to the area. Also once she tries a regular shoe even with an insert the skin starts to breakdown and she gets reoccurring wounds to the right foot along this area. She is inquiring about other treatment options that can be performed to help minimize the callus/pain/wound. Also she'll callus the tip of the second toe. She denies any swelling to her foot denies any redness or drainage from the sites. Denies any systemic complaints such as fevers, chills, nausea, vomiting. No acute changes since last appointment, and no other complaints at this time.   Objective: AAO x3, NAD DP/PT pulses palpable bilaterally, CRT less than 3 seconds Right foot [place] 5 hyperkeratotic lesion. Upon debridement there is no underlying ulceration. There is no surrounding erythema, ascending cellulitis, fluctuance, crepitus, malodor. There iscontinued tenderness to palpation to this area submet 5.  Pre-ulcerative lesion to the distal aspect of the second toe nurse thick hyperkeratotic tissue with evidence of dried blood underneath the callus. This appears to be improved compared to last appointment after debridement there is no underlying ulceration, drainage or any signs of infection.  No other open lesions or pre-ulcerative lesions.  No pain with calf compression, swelling, warmth, erythema  Assessment: Healing wound right second metatarsal 5, pre-ulcerative callus right second toe  Plan: -All treatment options discussed with the patient including all alternatives, risks, complications.  -S-ray was obtained and reviewed. No definitive evidence of cortical changes. -Hyperkeratotic lesion was debrided to the right foot 2 without complications or bleeding. -At this time a discussed the  treatment options the patient. I discussed possible fifth metatarsal head excision on the right foot due to the pain as well as recurrent ulceration. I discussed with her the surgery as well as the postoperative course. I discussed with her main risk of this would be transfer lesions. We've had multiple discussions about possibly a transmetatarsal amputation in the surgery went and hope prevent that. She understands loss of more of her foot/leg. She wishes to pursue surgery due to the pain The incision placement as well as the postoperative course was discussed with the patient. I discussed risks of the surgery which include, but not limited to, infection, bleeding, pain, swelling, need for further surgery, delayed or nonhealing, painful or ugly scar, numbness or sensation changes, over/under correction, recurrence, transfer lesions, further deformity, hardware failure, DVT/PE, loss of toe/foot. Patient understands these risks and wishes to proceed with surgery. The surgical consent was reviewed with the patient all 3 pages were signed. No promises or guarantees were given to the outcome of the procedure. All questions were answered to the best of my ability. Before the surgery the patient was encouraged to call the office if there is any further questions. The surgery will be performed at the [facility] on an outpatient basis.  Celesta Gentile, DPM

## 2016-06-21 NOTE — Telephone Encounter (Signed)
"  I saw the doctor yesterday.  He told me to call you to get scheduled for surgery."  Do you have a date in mind?  "He said January 17.  Okay, I will get it scheduled.  "I just go on-line to register?"  Yes, you are correct.

## 2016-06-23 MED ORDER — SITAGLIPTIN PHOSPHATE 100 MG PO TABS: 100.0000 mg | ORAL_TABLET | Freq: Every day | ORAL | 3 refills | Status: DC

## 2016-06-23 NOTE — Telephone Encounter (Signed)
I spoke with her over the phone, and she expressed to me the copay was $60. She went ahead and picked up the refill. I will go ahead and send in for a year's supply. Her glycemic control has improved on this medication.

## 2016-06-29 ENCOUNTER — Encounter: Payer: Self-pay | Admitting: Podiatry

## 2016-06-29 DIAGNOSIS — Z87898 Personal history of other specified conditions: Secondary | ICD-10-CM | POA: Diagnosis not present

## 2016-06-29 DIAGNOSIS — M216X1 Other acquired deformities of right foot: Secondary | ICD-10-CM | POA: Diagnosis not present

## 2016-06-29 DIAGNOSIS — M21541 Acquired clubfoot, right foot: Secondary | ICD-10-CM | POA: Diagnosis not present

## 2016-06-29 DIAGNOSIS — I10 Essential (primary) hypertension: Secondary | ICD-10-CM | POA: Diagnosis not present

## 2016-07-01 ENCOUNTER — Other Ambulatory Visit: Payer: Self-pay | Admitting: Podiatry

## 2016-07-01 NOTE — Telephone Encounter (Signed)
I asked pt to describe the pain and she said it is a constant throbbing and occasional burning. I told pt to remove the  Surgical boot and open-ended sock and ace wrap, then elevate the foot for 15 minutes, after 15 minutes put foot level with hip and rewrap the ace looser and reapply sock and boot. I told pt if the throbbing worsened elevated at this time she could dangle for 15 minutes but this was the only time it would be okay. I told pt to remain in the boot at all times. Pt states understanding and I told her to call if she had any questions.

## 2016-07-01 NOTE — Telephone Encounter (Signed)
Pt had surgery Wednesday and you gave her Vicodin And it is not touching the pain. Can you please give her something else for pain before the weekend.

## 2016-07-05 ENCOUNTER — Encounter: Payer: Self-pay | Admitting: Sports Medicine

## 2016-07-05 ENCOUNTER — Ambulatory Visit (INDEPENDENT_AMBULATORY_CARE_PROVIDER_SITE_OTHER): Payer: Medicare Other

## 2016-07-05 ENCOUNTER — Encounter: Payer: Self-pay | Admitting: Podiatry

## 2016-07-05 ENCOUNTER — Ambulatory Visit (INDEPENDENT_AMBULATORY_CARE_PROVIDER_SITE_OTHER): Payer: Self-pay | Admitting: Sports Medicine

## 2016-07-05 DIAGNOSIS — Z9889 Other specified postprocedural states: Secondary | ICD-10-CM

## 2016-07-05 DIAGNOSIS — M79671 Pain in right foot: Secondary | ICD-10-CM

## 2016-07-05 DIAGNOSIS — M216X1 Other acquired deformities of right foot: Secondary | ICD-10-CM

## 2016-07-05 DIAGNOSIS — E1149 Type 2 diabetes mellitus with other diabetic neurological complication: Secondary | ICD-10-CM

## 2016-07-05 MED ORDER — HYDROMORPHONE HCL 4 MG PO TABS
4.0000 mg | ORAL_TABLET | ORAL | 0 refills | Status: DC | PRN
Start: 2016-07-05 — End: 2016-08-19

## 2016-07-05 NOTE — Progress Notes (Signed)
Subjective: [name] is a 66 y.o. female patient seen today in office for POV #1 (DOS 06-29-16), S/P Right 5th met head resection with Dr. Jacqualyn Posey. Patient admits pain at surgical site not relieved with Vicodin, denies calf pain, denies headache, chest pain, shortness of breath, nausea, vomiting, fever, or chills. Patient states that she called for appointment today because daughter noticed blood on bandage. No other issues noted.   Patient Active Problem List   Diagnosis Date Noted  . Diabetic ulcer of right foot ([place]) 01/01/2016  . Onychomycosis of multiple toenails with type 2 diabetes mellitus ([place]) 11/04/2015  . Skin lesions 11/04/2015  . Screening for colon cancer 11/04/2015  . Hematoma of scalp 09/18/2015  . Orthostatic syncope   . Sinusitis 06/19/2015  . Obesity (BMI 30.0-34.9) 05/27/2015  . Fatigue 05/11/2015  . Leg ulcer, left ([place]) 03/05/2015  . Recurrent falls 03/05/2015  . Primary osteoarthritis of left knee 09/22/2014  . Esophageal reflux 07/18/2014  . Hyperlipidemia associated with type 2 diabetes mellitus ([place]) 02/26/2014  . Tobacco use disorder 07/22/2013  . Headache 06/28/2013  . Diabetic peripheral neuropathy associated with type 2 diabetes mellitus ([place]) 06/14/2013  . Hx-TIA (transient ischemic attack) 03/10/2013  . Depression, major, recurrent ([place]) 12/13/2012  . Health care maintenance 12/13/2012  . Diastolic dysfunction 62/83/6629  . Diabetes mellitus type 2, controlled, with complications ([place]) 47/65/4650  . Hypertension 05/13/2012    Current Outpatient Prescriptions on File Prior to Visit  Medication Sig Dispense Refill  . albuterol (PROVENTIL HFA;VENTOLIN HFA) 108 (90 BASE) MCG/ACT inhaler Inhale 1-2 puffs into the lungs every 6 (six) hours as needed for wheezing or shortness of breath. 1 Inhaler 3  . amLODipine (NORVASC) 5 MG tablet Take 1 tablet (5 mg total) by mouth daily. 30 tablet 11  . aspirin 81 MG tablet Take 81 mg by mouth every morning.      Marland Kitchen aspirin-acetaminophen-caffeine (EXCEDRIN MIGRAINE) 250-250-65 MG tablet Take 2 tablets by mouth every 6 (six) hours as needed for headache.    . Blood Glucose Monitoring Suppl (ONETOUCH VERIO) w/Device KIT 1 each by Does not apply route 3 (three) times daily. 1 kit 0  . butalbital-acetaminophen-caffeine (FIORICET, ESGIC) 50-325-40 MG tablet Take 1 tablet by mouth at bedtime as needed for headache. 30 tablet 0  . cephALEXin (KEFLEX) 500 MG capsule Take 1 capsule (500 mg total) by mouth 3 (three) times daily. 30 capsule 2  . cephALEXin (KEFLEX) 500 MG capsule Take 1 capsule (500 mg total) by mouth 3 (three) times daily. 30 capsule 2  . ciprofloxacin (CIPRO) 500 MG tablet Take 1 tablet (500 mg total) by mouth 2 (two) times daily. 28 tablet 0  . clindamycin (CLEOCIN) 300 MG capsule Take 1 capsule (300 mg total) by mouth 3 (three) times daily. 30 capsule 2  . doxycycline (VIBRAMYCIN) 100 MG capsule Take 1 capsule (100 mg total) by mouth 2 (two) times daily. 28 capsule 0  . DULoxetine (CYMBALTA) 60 MG capsule Take 1 capsule (60 mg total) by mouth every morning. 90 capsule 3  . gabapentin (NEURONTIN) 600 MG tablet take 2 tablets by mouth three times a day 180 tablet 6  . glipiZIDE (GLUCOTROL XL) 10 MG 24 hr tablet Take 1 tablet (10 mg total) by mouth daily with supper. 90 tablet 3  . glucose blood (ONETOUCH VERIO) test strip Check blood sugar 3x/day 100 each 12  . HYDROcodone-acetaminophen (NORCO/VICODIN) 5-325 MG tablet Take 1 tablet by mouth every 4 (four) hours as needed. 30 tablet  0  . lisinopril (PRINIVIL,ZESTRIL) 40 MG tablet take 1 tablet by mouth once daily 90 tablet 3  . metFORMIN (GLUCOPHAGE-XR) 750 MG 24 hr tablet take 2 tablets by mouth once daily WITH BREAKFAST 180 tablet 3  . ONETOUCH DELICA LANCETS FINE MISC Check blood sugar up to 3 times a day 100 each 12  . pantoprazole (PROTONIX) 20 MG tablet Take 1 tablet (20 mg total) by mouth daily as needed. (Patient taking differently: Take 20 mg  by mouth daily. ) 90 tablet 3  . rosuvastatin (CRESTOR) 40 MG tablet Take 1 tablet (40 mg total) by mouth daily. 90 tablet 3  . silver sulfADIAZINE (SILVADENE) 1 % cream Apply 1 application topically daily. 50 g 0  . sitaGLIPtin (JANUVIA) 100 MG tablet Take 1 tablet (100 mg total) by mouth daily. 90 tablet 3   No current facility-administered medications on file prior to visit.     Allergies  Allergen Reactions  . Flexeril [Cyclobenzaprine] Other (See Comments)    Prolonged QTc to 571, tachycardia  . Soma [Carisoprodol] Itching and Rash    Objective: There were no vitals filed for this visit.  General: No acute distress, AAOx3  Right: Sutures intact with no gapping or dehiscence at surgical site, mild swelling to right foot, no erythema, no warmth, no drainage, no signs of infection noted, Capillary fill time <5 seconds in all remaining digits, gross sensation present via light touch to right foot however protective sensation absent. No pain or crepitation with range of motion right foot however there is throbbing pain worse at end of day likely from excessive walking and standing.  No pain with calf compression.   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 No other acute findings. Soft tissue swelling within normal limits for post op status.   Assessment and Plan:  Problem List Items Addressed This Visit    None    Visit Diagnoses    Type II diabetes mellitus with neurological manifestations ([place])    -  Primary   Relevant Orders   DG Foot Complete Right   Prominent metatarsal head of right foot       Relevant Orders   DG Foot Complete Right   S/P foot surgery, right       Right foot pain       Relevant Medications   HYDROmorphone (DILAUDID) 4 MG tablet      -Patient seen and evaluated -Xray reviewed  -Applied antibiotic dry sterile dressing to surgical site right foot secured with ACE wrap and stockinet  -Advised patient to make  sure to keep dressings clean, dry, and intact to left/right surgical site, removing the ACE as needed to adjust  -Advised patient to continue with post-op shoe on right foot   -Advised patient to limit activity to necessity  -Advised patient to elevate as necessary  -Changed pain medication from Vicodin to Dilaudid to see if this will help give her better relief with her pain -Continue with oral antibiotics   -Patient to follow up next week as scheduled with Dr. Jacqualyn Posey and he will review pathology findings from surgery with her . In the meantime, patient to call office if any issues or problems arise.   Landis Martins, DPM

## 2016-07-08 ENCOUNTER — Encounter: Payer: Medicare Other | Admitting: Podiatry

## 2016-07-08 ENCOUNTER — Telehealth: Payer: Self-pay | Admitting: *Deleted

## 2016-07-08 NOTE — Telephone Encounter (Addendum)
-----   Message from Trula Slade, DPM sent at 07/06/2016  7:58 PM EST ----- Please let her know that her pathology did not show any bone infection. 07/08/2016-Informed pt of Dr. Leigh Aurora review of results.

## 2016-07-08 NOTE — Telephone Encounter (Signed)
Spoke with pt, who stated her bandage had come off. Advised her on how to wrap her toe

## 2016-07-11 ENCOUNTER — Encounter: Payer: Medicare Other | Admitting: Podiatry

## 2016-07-14 ENCOUNTER — Encounter: Payer: Medicare Other | Admitting: Podiatry

## 2016-07-15 ENCOUNTER — Encounter: Payer: BLUE CROSS/BLUE SHIELD | Admitting: Internal Medicine

## 2016-07-18 ENCOUNTER — Ambulatory Visit (INDEPENDENT_AMBULATORY_CARE_PROVIDER_SITE_OTHER): Payer: Medicare Other

## 2016-07-18 ENCOUNTER — Ambulatory Visit (INDEPENDENT_AMBULATORY_CARE_PROVIDER_SITE_OTHER): Payer: Medicare Other | Admitting: Podiatry

## 2016-07-18 ENCOUNTER — Encounter: Payer: Self-pay | Admitting: Podiatry

## 2016-07-18 VITALS — BP 113/64 | HR 82 | Resp 16

## 2016-07-18 DIAGNOSIS — M216X1 Other acquired deformities of right foot: Secondary | ICD-10-CM

## 2016-07-18 DIAGNOSIS — M21541 Acquired clubfoot, right foot: Secondary | ICD-10-CM

## 2016-07-18 DIAGNOSIS — Z9889 Other specified postprocedural states: Secondary | ICD-10-CM | POA: Diagnosis not present

## 2016-07-18 DIAGNOSIS — E1149 Type 2 diabetes mellitus with other diabetic neurological complication: Secondary | ICD-10-CM

## 2016-07-18 DIAGNOSIS — L97511 Non-pressure chronic ulcer of other part of right foot limited to breakdown of skin: Secondary | ICD-10-CM

## 2016-07-18 DIAGNOSIS — L84 Corns and callosities: Secondary | ICD-10-CM | POA: Diagnosis not present

## 2016-07-19 NOTE — Progress Notes (Signed)
Subjective: [name] is a 66 y.o. is seen today in office s/p right 5th metatarsal head excision preformed on 06/29/16. She states that she is not having any pain along the surgical site and the pain she was getting under the area due to the callus is gone. She has started to have more pain to the 2nd toe however. Denies any systemic complaints such as fevers, chills, nausea, vomiting. No calf pain, chest pain, shortness of breath.   Objective: General: No acute distress, AAOx3  DP/PT pulses palpable 2/4, CRT < 3 sec to all digits.  Protective sensation intact. Motor function intact.  Right foot: Incision is well coapted without any evidence of dehiscence and sutures are intact. There is no surrounding erythema, ascending cellulitis, fluctuance, crepitus, malodor, drainage/purulence. There is no edema around the surgical site. There is no pain along the surgical site.  Thick hyperkeratotic lesions present the distal aspect of the right second toe. There is dried blood under the callus. Upon debridement there is no underlying ulceration, drainage or any signs of infection. Hammertoes present. No other areas of tenderness to bilateral lower extremities.  No other open lesions or pre-ulcerative lesions.  No pain with calf compression, swelling, warmth, erythema.   Assessment and Plan:  Status post right fifth metatarsal head excision, doing well with no complications; pre-ulcerative callus right second toe   -Treatment options discussed including all alternatives, risks, and complications -X-rays were obtained and reviewed with the patient. Status post fifth metatarsal head excision. No evidence of acute osteomyelitis. Hammertoe of the second toe present. -Sutures removed today without complications or bleeding. Antibiotic ointment and a bandage applied. She has not been putting a bandage on the foot. Recommended to at least to Neosporin on the incision. She can start to shower tomorrow. She can  start to transition back into her diabetic shoes as able -Hyperkeratotic lesion to the right 2nd toe was sharply debrided without complications or bleeding. Discussed hammertoe repair but she would like to have her left knee surgery prior to this.  -Monitor for any clinical signs or symptoms of infection and DVT/PE and directed to call the office immediately should any occur or go to the ER. -Follow-up as scheduled or sooner if any problems arise. In the meantime, encouraged to call the office with any questions, concerns, change in symptoms.   Celesta Gentile, DPM

## 2016-07-20 NOTE — Progress Notes (Signed)
DOS 01.17.2018 Right foot fifth metatarsal head removal to help prevent ulcer.

## 2016-08-03 ENCOUNTER — Other Ambulatory Visit: Payer: Self-pay | Admitting: Internal Medicine

## 2016-08-03 DIAGNOSIS — E785 Hyperlipidemia, unspecified: Secondary | ICD-10-CM

## 2016-08-04 NOTE — Telephone Encounter (Signed)
As this patient was seen by me and deemed necessary for their treatment, I will refill this prescription for rosuvastatin.

## 2016-08-12 ENCOUNTER — Ambulatory Visit (INDEPENDENT_AMBULATORY_CARE_PROVIDER_SITE_OTHER): Payer: Medicare Other | Admitting: Podiatry

## 2016-08-12 DIAGNOSIS — E1149 Type 2 diabetes mellitus with other diabetic neurological complication: Secondary | ICD-10-CM | POA: Diagnosis not present

## 2016-08-12 DIAGNOSIS — L84 Corns and callosities: Secondary | ICD-10-CM

## 2016-08-12 DIAGNOSIS — M216X1 Other acquired deformities of right foot: Secondary | ICD-10-CM

## 2016-08-12 DIAGNOSIS — Q828 Other specified congenital malformations of skin: Secondary | ICD-10-CM | POA: Diagnosis not present

## 2016-08-14 ENCOUNTER — Emergency Department (HOSPITAL_COMMUNITY)
Admission: EM | Admit: 2016-08-14 | Discharge: 2016-08-14 | Disposition: A | Discharge status: Home / Self Care | Payer: Medicare Other | Attending: Emergency Medicine | Admitting: Emergency Medicine

## 2016-08-14 ENCOUNTER — Encounter (HOSPITAL_COMMUNITY): Payer: Self-pay

## 2016-08-14 DIAGNOSIS — R062 Wheezing: Secondary | ICD-10-CM | POA: Insufficient documentation

## 2016-08-14 DIAGNOSIS — I509 Heart failure, unspecified: Secondary | ICD-10-CM | POA: Diagnosis not present

## 2016-08-14 DIAGNOSIS — Z7982 Long term (current) use of aspirin: Secondary | ICD-10-CM | POA: Insufficient documentation

## 2016-08-14 DIAGNOSIS — I110 Hypertensive heart disease with heart failure: Secondary | ICD-10-CM | POA: Insufficient documentation

## 2016-08-14 DIAGNOSIS — H6690 Otitis media, unspecified, unspecified ear: Secondary | ICD-10-CM

## 2016-08-14 DIAGNOSIS — J449 Chronic obstructive pulmonary disease, unspecified: Secondary | ICD-10-CM | POA: Diagnosis not present

## 2016-08-14 DIAGNOSIS — Z79899 Other long term (current) drug therapy: Secondary | ICD-10-CM | POA: Diagnosis not present

## 2016-08-14 DIAGNOSIS — E1140 Type 2 diabetes mellitus with diabetic neuropathy, unspecified: Secondary | ICD-10-CM | POA: Diagnosis not present

## 2016-08-14 DIAGNOSIS — F17210 Nicotine dependence, cigarettes, uncomplicated: Secondary | ICD-10-CM | POA: Insufficient documentation

## 2016-08-14 DIAGNOSIS — H9201 Otalgia, right ear: Secondary | ICD-10-CM | POA: Diagnosis present

## 2016-08-14 DIAGNOSIS — Z8673 Personal history of transient ischemic attack (TIA), and cerebral infarction without residual deficits: Secondary | ICD-10-CM | POA: Insufficient documentation

## 2016-08-14 DIAGNOSIS — H6691 Otitis media, unspecified, right ear: Secondary | ICD-10-CM | POA: Diagnosis not present

## 2016-08-14 DIAGNOSIS — Z7984 Long term (current) use of oral hypoglycemic drugs: Secondary | ICD-10-CM | POA: Insufficient documentation

## 2016-08-14 MED ORDER — ALBUTEROL SULFATE (2.5 MG/3ML) 0.083% IN NEBU
5.0000 mg | INHALATION_SOLUTION | Freq: Once | RESPIRATORY_TRACT | Status: AC
  Administered 2016-08-14: 5 mg via RESPIRATORY_TRACT
  Filled 2016-08-14: qty 6

## 2016-08-14 MED ORDER — ACETAMINOPHEN 325 MG PO TABS
650.0000 mg | ORAL_TABLET | Freq: Once | ORAL | Status: AC
  Administered 2016-08-14: 650 mg via ORAL
  Filled 2016-08-14: qty 2

## 2016-08-14 MED ORDER — IPRATROPIUM BROMIDE 0.02 % IN SOLN
0.5000 mg | Freq: Once | RESPIRATORY_TRACT | Status: AC
  Administered 2016-08-14: 0.5 mg via RESPIRATORY_TRACT
  Filled 2016-08-14: qty 2.5

## 2016-08-14 MED ORDER — FLUTICASONE PROPIONATE 50 MCG/ACT NA SUSP: 2.0000 | Freq: Every day | NASAL | 0 refills | Status: DC

## 2016-08-14 MED ORDER — AMOXICILLIN-POT CLAVULANATE 875-125 MG PO TABS
1.0000 | ORAL_TABLET | Freq: Once | ORAL | Status: AC
  Administered 2016-08-14: 1 via ORAL
  Filled 2016-08-14: qty 1

## 2016-08-14 MED ORDER — AMOXICILLIN-POT CLAVULANATE 875-125 MG PO TABS: 1.0000 | ORAL_TABLET | Freq: Two times a day (BID) | ORAL | 0 refills | Status: DC

## 2016-08-14 MED ORDER — ACETAMINOPHEN 325 MG PO TABS: 650.0000 mg | ORAL_TABLET | Freq: Four times a day (QID) | ORAL | 0 refills | Status: DC | PRN

## 2016-08-14 NOTE — ED Notes (Signed)
Pt ambulated with pulse ox. Sats remained 98% and rr did not increase.  Pt expressed no increased sob.

## 2016-08-14 NOTE — ED Provider Notes (Signed)
[facility] DEPT Provider Note   CSN: [id number] Arrival date & time: 08/14/16  0712     History   Chief Complaint Chief Complaint  Patient presents with  . wheezing, neck pain    HPI [name] is a 66 y.o. female.  [name] is a 66 y.o. Female who presents to the ED complaining of right sided neck pain and ear pain for the past two days. She reports her pain began with what she thought was a stiff neck and then she began having right ear pain and pressure. She reports having ear infections previously, but none recently. She reports sore throat and pain with swallowing. She has been able to eat and drink. She reports some associated nasal congestion. She reports wheezing starting today, but denies feeling short of breath.  She would like a breathing treatment. She denies fevers, trouble swallowing, headaches, changes to her vision, abdominal pain, nausea, vomiting, diarrhea, rashes, shortness of breath, chest pain, ear discharge.    The history is provided by the patient and medical records. No language interpreter was used.    Past Medical History:  Diagnosis Date  . Anemia   . Arthritis    "left knee" (09/08/2015)  . Brachial plexus disorders   . CHF (congestive heart failure) ([place])   . Chronic pain    neck pain, headache, neuropathy  . COPD (chronic obstructive pulmonary disease) ([place])   . Depression    "when my husband passed in 2013"  . GERD (gastroesophageal reflux disease)   . Hypertension   . Hypertriglyceridemia   . Migraine    "monthly" (09/08/2015)  . Neuromuscular disorder ([place])   . Puncture wound of foot, right 05/17/2012   Tetanus shot 3 yrs ago at [facility] in [place], per pt report   . Type II diabetes mellitus ([place]) 2010   diagnosed around 2010, only ever on metformin    Patient Active Problem List   Diagnosis Date Noted  . Diabetic ulcer of right foot ([place]) 01/01/2016  . Onychomycosis of multiple toenails with type 2 diabetes mellitus  ([place]) 11/04/2015  . Skin lesions 11/04/2015  . Screening for colon cancer 11/04/2015  . Hematoma of scalp 09/18/2015  . Orthostatic syncope   . Sinusitis 06/19/2015  . Obesity (BMI 30.0-34.9) 05/27/2015  . Fatigue 05/11/2015  . Leg ulcer, left ([place]) 03/05/2015  . Recurrent falls 03/05/2015  . Primary osteoarthritis of left knee 09/22/2014  . Esophageal reflux 07/18/2014  . Hyperlipidemia associated with type 2 diabetes mellitus ([place]) 02/26/2014  . Tobacco use disorder 07/22/2013  . Headache 06/28/2013  . Diabetic peripheral neuropathy associated with type 2 diabetes mellitus ([place]) 06/14/2013  . Hx-TIA (transient ischemic attack) 03/10/2013  . Depression, major, recurrent ([place]) 12/13/2012  . Health care maintenance 12/13/2012  . Diastolic dysfunction 47/82/9562  . Diabetes mellitus type 2, controlled, with complications ([place]) 13/01/6577  . Hypertension 05/13/2012    Past Surgical History:  Procedure Laterality Date  . 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 BILATERAL SALPINGOOPHORECTOMY Bilateral 1986   "after hemtoma evacuations; had to cut me open"  . CARPAL TUNNEL RELEASE Bilateral   . DILATION AND CURETTAGE OF UTERUS  ~ 1982   S/P miscarriage  . HEMATOMA EVACUATION Right 1986 x 2   "OVARY; w/in 1 wk after hysterectomy"  . LAPAROSCOPIC CHOLECYSTECTOMY    . SHOULDER ARTHROSCOPY W/ ROTATOR CUFF  REPAIR Bilateral   . TONSILLECTOMY    . TUBAL LIGATION    . VAGINAL HYSTERECTOMY  1986    OB History    No data available       Home Medications    Prior to Admission medications   Medication Sig Start Date End Date Taking? Authorizing Provider  acetaminophen (TYLENOL) 325 MG tablet Take 2 tablets (650 mg total) by mouth every 6 (six) hours as needed for mild pain or moderate pain. 08/14/16   Waynetta Pean, PA-C  albuterol (PROVENTIL HFA;VENTOLIN HFA)  108 (90 BASE) MCG/ACT inhaler Inhale 1-2 puffs into the lungs every 6 (six) hours as needed for wheezing or shortness of breath. 01/24/14   Rushil Sherrye Payor, MD  amLODipine (NORVASC) 2.5 MG tablet Take 2.5 mg by mouth daily. 05/10/16   Historical Provider, MD  amLODipine (NORVASC) 5 MG tablet Take 1 tablet (5 mg total) by mouth daily. 06/03/16 06/03/17  Norman Herrlich, MD  amoxicillin-clavulanate (AUGMENTIN) 875-125 MG tablet Take 1 tablet by mouth every 12 (twelve) hours. 08/14/16   Waynetta Pean, PA-C  aspirin 81 MG tablet Take 81 mg by mouth every morning.     Historical Provider, MD  aspirin-acetaminophen-caffeine (EXCEDRIN MIGRAINE) [phone] MG tablet Take 2 tablets by mouth every 6 (six) hours as needed for headache.    Historical Provider, MD  Blood Glucose Monitoring Suppl (ONETOUCH VERIO) w/Device KIT 1 each by Does not apply route 3 (three) times daily. 06/03/16   Sid Falcon, MD  butalbital-acetaminophen-caffeine (FIORICET, ESGIC) [phone] MG tablet Take 1 tablet by mouth at bedtime as needed for headache. 11/04/15   Riccardo Dubin, MD  cephALEXin (KEFLEX) 500 MG capsule Take 1 capsule (500 mg total) by mouth 3 (three) times daily. 03/18/16   Trula Slade, DPM  cephALEXin (KEFLEX) 500 MG capsule Take 1 capsule (500 mg total) by mouth 3 (three) times daily. 04/25/16   Trula Slade, DPM  ciprofloxacin (CIPRO) 500 MG tablet Take 1 tablet (500 mg total) by mouth 2 (two) times daily. 06/20/16   Trula Slade, DPM  clindamycin (CLEOCIN) 300 MG capsule Take 1 capsule (300 mg total) by mouth 3 (three) times daily. 05/16/16   Trula Slade, DPM  doxycycline (VIBRAMYCIN) 100 MG capsule Take 1 capsule (100 mg total) by mouth 2 (two) times daily. 12/25/15   Roxanna Mew, PA-C  DULoxetine (CYMBALTA) 60 MG capsule Take 1 capsule (60 mg total) by mouth every morning. 05/11/15   Alexa Angela Burke, MD  fluticasone (FLONASE) 50 MCG/ACT nasal spray Place 2 sprays into both nostrils daily.  08/14/16   Waynetta Pean, PA-C  gabapentin (NEURONTIN) 600 MG tablet take 2 tablets by mouth three times a day 04/12/16   Riccardo Dubin, MD  glipiZIDE (GLUCOTROL XL) 10 MG 24 hr tablet Take 1 tablet (10 mg total) by mouth daily with supper. 08/18/15   Nischal Narendra, MD  glucose blood (ONETOUCH VERIO) test strip Check blood sugar 3x/day 06/03/16   Sid Falcon, MD  HYDROcodone-acetaminophen (NORCO/VICODIN) 5-325 MG tablet Take 1 tablet by mouth every 4 (four) hours as needed. 05/16/16   Trula Slade, DPM  HYDROcodone-acetaminophen (NORCO/VICODIN) 5-325 MG tablet Take 1-2 tablets by mouth every 4 (four) hours as needed for moderate pain. 06/29/16   Trula Slade, DPM  HYDROmorphone (DILAUDID) 4 MG tablet Take 1 tablet (4 mg total) by mouth every 4 (four) hours as needed for severe pain. 07/05/16   Landis Martins, DPM  lisinopril (  PRINIVIL,ZESTRIL) 40 MG tablet take 1 tablet by mouth once daily 06/17/15   Riccardo Dubin, MD  metFORMIN (GLUCOPHAGE-XR) 750 MG 24 hr tablet take 2 tablets by mouth once daily WITH BREAKFAST 02/04/16   Riccardo Dubin, MD  methocarbamol (ROBAXIN) 500 MG tablet  04/11/16   Historical Provider, MD  [facility] DELICA LANCETS FINE MISC Check blood sugar up to 3 times a day 05/31/16   Sid Falcon, MD  pantoprazole (PROTONIX) 20 MG tablet Take 1 tablet (20 mg total) by mouth daily as needed. Patient taking differently: Take 20 mg by mouth daily.  11/10/15   Rushil Sherrye Payor, MD  rosuvastatin (CRESTOR) 40 MG tablet take 1 tablet by mouth once daily 08/04/16   Riccardo Dubin, MD  silver sulfADIAZINE (SILVADENE) 1 % cream Apply 1 application topically daily. 04/25/16   Trula Slade, DPM  sitaGLIPtin (JANUVIA) 100 MG tablet Take 1 tablet (100 mg total) by mouth daily. 06/23/16   Rushil Sherrye Payor, MD  sulfamethoxazole-trimethoprim (BACTRIM DS,SEPTRA DS) 800-160 MG tablet Take 1 tablet by mouth 2 (two) times daily. for 10 days 06/30/16   Historical Provider, MD    sulfamethoxazole-trimethoprim (BACTRIM DS,SEPTRA DS) 800-160 MG tablet Take 1 tablet by mouth 2 (two) times daily. 06/29/16   Trula Slade, DPM    Family History Family History  Problem Relation Age of Onset  . Hyperlipidemia Mother   . Heart attack Father 52  . Hypertension Father   . Cancer Paternal Grandfather     Lung cancer  . Cancer Maternal Grandmother   . Heart attack Maternal Grandmother     Social History Social History  Substance Use Topics  . Smoking status: Current Every Day Smoker    Packs/day: 0.10    Years: 47.00    Types: Cigarettes  . Smokeless tobacco: Never Used     Comment:  2 cigs per week  . Alcohol use No     Allergies   Flexeril [cyclobenzaprine] and Soma [carisoprodol]   Review of Systems Review of Systems  Constitutional: Negative for chills and fever.  HENT: Positive for congestion, postnasal drip, rhinorrhea and sore throat. Negative for trouble swallowing and voice change.   Eyes: Negative for pain and visual disturbance.  Respiratory: Positive for wheezing. Negative for cough and shortness of breath.   Cardiovascular: Negative for chest pain.  Gastrointestinal: Negative for abdominal pain, diarrhea, nausea and vomiting.  Genitourinary: Negative for dysuria.  Musculoskeletal: Positive for neck pain. Negative for back pain.  Skin: Negative for rash.  Neurological: Negative for dizziness, light-headedness and headaches.     Physical Exam Updated Vital Signs BP 120/73   Pulse 80   Temp 98.2 F (36.8 C) (Oral)   Resp 20   SpO2 96%   Physical Exam  Constitutional: She is oriented to person, place, and time. She appears well-developed and well-nourished. No distress.  Nontoxic appearing.  HENT:  Head: Normocephalic and atraumatic.  Right Ear: External ear normal.  Left Ear: External ear normal.  Mouth/Throat: Oropharynx is clear and moist. No oropharyngeal exudate.  Right TM is bulging with erythema and loss of landmarks.  Left TM has mild clear middle ear effusion. No TM erythema or loss of landmarks. No EAC edema or discharge.  Rhinorrhea and boggy nasal turbinates bilaterally. Throat is clear. Evidence of postnasal drip. No tonsillar hypertrophy or exudates. Uvula is midline without edema. No stridor. No trismus. No peritonsillar abscess. No mastoid tenderness.  Eyes: Conjunctivae are normal. Pupils are  equal, round, and reactive to light. Right eye exhibits no discharge. Left eye exhibits no discharge.  Neck: Normal range of motion. Neck supple.  Tenderness along the right lateral aspect of her neck. No mastoid tenderness. Good range of motion of her neck. No meningeal signs.  Cardiovascular: Normal rate, regular rhythm, normal heart sounds and intact distal pulses.  Exam reveals no gallop and no friction rub.   No murmur heard. Pulmonary/Chest: Effort normal. No stridor. No respiratory distress. She has wheezes. She has no rales.  Wheezing noted bilaterally. No increased work of breathing. No rales or rhonchi.  Abdominal: Soft. There is no tenderness. There is no guarding.  Musculoskeletal: She exhibits no edema.  Lymphadenopathy:    She has no cervical adenopathy.  Neurological: She is alert and oriented to person, place, and time. No sensory deficit. Coordination normal.  Skin: Skin is warm and dry. Capillary refill takes less than 2 seconds. No rash noted. She is not diaphoretic. No erythema. No pallor.  Psychiatric: She has a normal mood and affect. Her behavior is normal.  Nursing note and vitals reviewed.    ED Treatments / Results  Labs (all labs ordered are listed, but only abnormal results are displayed) Labs Reviewed - No data to display  EKG  EKG Interpretation None       Radiology No results found.  Procedures Procedures (including critical care time)  Medications Ordered in ED Medications  albuterol (PROVENTIL) (2.5 MG/3ML) 0.083% nebulizer solution 5 mg (5 mg Nebulization  Given 08/14/16 0851)  ipratropium (ATROVENT) nebulizer solution 0.5 mg (0.5 mg Nebulization Given 08/14/16 0852)  acetaminophen (TYLENOL) tablet 650 mg (650 mg Oral Given 08/14/16 0850)  amoxicillin-clavulanate (AUGMENTIN) 875-125 MG per tablet 1 tablet (1 tablet Oral Given 08/14/16 0851)     Initial Impression / Assessment and Plan / ED Course  I have reviewed the triage vital signs and the nursing notes.  Pertinent labs & imaging results that were available during my care of the patient were reviewed by me and considered in my medical decision making (see chart for details).    This is a 66 y.o. Female who presents to the ED complaining of right sided neck pain and ear pain for the past two days. She reports her pain began with what she thought was a stiff neck and then she began having right ear pain and pressure. She reports having ear infections previously, but none recently. She reports sore throat and pain with swallowing. She has been able to eat and drink. She reports some associated nasal congestion. She reports wheezing starting today, but denies feeling short of breath. On exam the patient is afebrile nontoxic appearing. Her right TM is bulging and erythematous. She has no mastoid tenderness. No otitis externa. She has good range of motion of her neck. No meningeal signs. Throat is clear. No tonsillar hypertrophy or exudates. No stridor or drooling. She has scattered wheezing noted bilaterally. This improved after breathing treatment. Patient denied any shortness of breath. She is ambulating on pulse ox without hypoxia. Patient tolerated her first dose of antibiotics and Tylenol in the emergency department without any difficulty. We'll discharge with Augmentin, Flonase and Tylenol. I encouraged close follow-up by her primary care doctor. I discussed strict and specific return precautions. I advised the patient to follow-up with their primary care provider this week. I advised the patient to return  to the emergency department with new or worsening symptoms or new concerns. The patient verbalized understanding and agreement  with plan.    Final Clinical Impressions(s) / ED Diagnoses   Final diagnoses:  Acute otitis media, unspecified otitis media type  Wheezing    New Prescriptions New Prescriptions   ACETAMINOPHEN (TYLENOL) 325 MG TABLET    Take 2 tablets (650 mg total) by mouth every 6 (six) hours as needed for mild pain or moderate pain.   AMOXICILLIN-CLAVULANATE (AUGMENTIN) 875-125 MG TABLET    Take 1 tablet by mouth every 12 (twelve) hours.   FLUTICASONE (FLONASE) 50 MCG/ACT NASAL SPRAY    Place 2 sprays into both nostrils daily.     Waynetta Pean, PA-C 08/14/16 1214    Duffy Bruce, MD 08/14/16 [phone]

## 2016-08-14 NOTE — ED Triage Notes (Addendum)
Patient complains of right sided neck pain x 3 days, pain with any movement of neck. Audible wheezing and congestion with same, states that her throat is sore when she swallows. Speaking complete sentences, no respiratory distress.

## 2016-08-15 DIAGNOSIS — Z4789 Encounter for other orthopedic aftercare: Secondary | ICD-10-CM | POA: Diagnosis not present

## 2016-08-15 DIAGNOSIS — M1712 Unilateral primary osteoarthritis, left knee: Secondary | ICD-10-CM | POA: Diagnosis not present

## 2016-08-15 NOTE — Progress Notes (Signed)
Subjective: [name] is a 66 y.o. is seen today in office s/p right 5th metatarsal head excision preformed on 06/29/16. The pain she was having prior to the surgery has resolved. She does that she has some sharp pain at times to her feet which she has been doing a lot of walking as her car is broken down. She continues to get a thick callus the second toe. Denies any swelling, redness, drainage or any open sores. Denies any systemic complaints such as fevers, chills, nausea, vomiting. No calf pain, chest pain, shortness of breath.   Objective: General: No acute distress, AAOx3  DP/PT pulses palpable 2/4, CRT < 3 sec to all digits.  Protective sensation intact. Motor function intact.  Right foot: Incision is well coapted without any evidence of dehiscence and a scar is formed. There is no surrounding erythema, ascending cellulitis, fluctuance, crepitus, malodor, drainage/purulence. There is no edema around the surgical site. There is no pain along the surgical site.  Thick hyperkeratotic lesions present the distal aspect of the right second toe. There is dried blood under the callus. Upon debridement there is no underlying ulceration, drainage or any signs of infection. Hammertoes present. No other areas of tenderness to bilateral lower extremities.  No other open lesions or pre-ulcerative lesions.  No pain with calf compression, swelling, warmth, erythema.   Assessment and Plan:  Status post right fifth metatarsal head excision, doing well with no complications; pre-ulcerative callus right second toe   -Treatment options discussed including all alternatives, risks, and complications -Surgical site is well-healed and doing well. No signs of infection. -Hyperkeratotic lesions/pre-ulcerative callus of the second toe is sharply debrided without, occasions or bleeding. Monitor skin breakdown. -RTC as scheduled or sooner if needed. Call any questions concerns in the meantime.  Celesta Gentile,  DPM

## 2016-08-19 ENCOUNTER — Encounter: Payer: Self-pay | Admitting: Internal Medicine

## 2016-08-19 ENCOUNTER — Ambulatory Visit (INDEPENDENT_AMBULATORY_CARE_PROVIDER_SITE_OTHER): Payer: Medicare Other | Admitting: Internal Medicine

## 2016-08-19 VITALS — BP 145/66 | HR 73 | Temp 98.2°F | Ht 66.0 in | Wt 188.1 lb

## 2016-08-19 DIAGNOSIS — Z8673 Personal history of transient ischemic attack (TIA), and cerebral infarction without residual deficits: Secondary | ICD-10-CM | POA: Diagnosis not present

## 2016-08-19 DIAGNOSIS — F32A Depression, unspecified: Secondary | ICD-10-CM

## 2016-08-19 DIAGNOSIS — F17210 Nicotine dependence, cigarettes, uncomplicated: Secondary | ICD-10-CM | POA: Diagnosis not present

## 2016-08-19 DIAGNOSIS — S161XXA Strain of muscle, fascia and tendon at neck level, initial encounter: Secondary | ICD-10-CM | POA: Diagnosis not present

## 2016-08-19 DIAGNOSIS — S61452A Open bite of left hand, initial encounter: Secondary | ICD-10-CM | POA: Diagnosis not present

## 2016-08-19 DIAGNOSIS — R2689 Other abnormalities of gait and mobility: Secondary | ICD-10-CM | POA: Diagnosis not present

## 2016-08-19 DIAGNOSIS — Z79899 Other long term (current) drug therapy: Secondary | ICD-10-CM | POA: Diagnosis not present

## 2016-08-19 DIAGNOSIS — Z7984 Long term (current) use of oral hypoglycemic drugs: Secondary | ICD-10-CM | POA: Diagnosis not present

## 2016-08-19 DIAGNOSIS — Z01818 Encounter for other preprocedural examination: Secondary | ICD-10-CM

## 2016-08-19 DIAGNOSIS — F330 Major depressive disorder, recurrent, mild: Secondary | ICD-10-CM

## 2016-08-19 DIAGNOSIS — W540XXA Bitten by dog, initial encounter: Secondary | ICD-10-CM

## 2016-08-19 DIAGNOSIS — E119 Type 2 diabetes mellitus without complications: Secondary | ICD-10-CM

## 2016-08-19 DIAGNOSIS — M1712 Unilateral primary osteoarthritis, left knee: Secondary | ICD-10-CM | POA: Diagnosis not present

## 2016-08-19 DIAGNOSIS — E118 Type 2 diabetes mellitus with unspecified complications: Secondary | ICD-10-CM

## 2016-08-19 DIAGNOSIS — Y9384 Activity, sleeping: Secondary | ICD-10-CM

## 2016-08-19 DIAGNOSIS — F320 Major depressive disorder, single episode, mild: Secondary | ICD-10-CM

## 2016-08-19 DIAGNOSIS — I10 Essential (primary) hypertension: Secondary | ICD-10-CM | POA: Diagnosis not present

## 2016-08-19 LAB — GLUCOSE, CAPILLARY: Glucose-Capillary: 132 mg/dL — ABNORMAL HIGH (ref 65–99)

## 2016-08-19 LAB — POCT GLYCOSYLATED HEMOGLOBIN (HGB A1C): HEMOGLOBIN A1C: 7

## 2016-08-19 MED ORDER — SERTRALINE HCL 50 MG PO TABS: 50.0000 mg | ORAL_TABLET | Freq: Every day | ORAL | 2 refills | Status: DC

## 2016-08-19 MED ORDER — METHOCARBAMOL 750 MG PO TABS: ORAL_TABLET | ORAL | 0 refills | Status: DC

## 2016-08-19 NOTE — Assessment & Plan Note (Signed)
Assessment A few days ago, she puller her right neck muscles in the middle of the night. Her muscle stiffness has not improved with ibuprofen alone. On exam, she has tightened right neck muscles as compared to the left. She has done with cyclobenzaprine in the past and has tolerated robaxin before.  Plan -Prescribe robaxin 750 mg tablets to be taken 1500 mg x 4 times/day for 3 days followed by 750 mg x 4 times/day for 4 days thereafter.  -Recommended heating pad as well for adjunctive therapy.

## 2016-08-19 NOTE — Assessment & Plan Note (Signed)
Assessment A1c 7.0, improved from 7.3 from 3 months ago. She acknowledges adherence to metformin XR 1500 mg, sitagliptin 100 mg, and glipizide XR 10 mg at dinner. Review of her glucometer log is notable for most values trending 100-150 with outliers in the 200s and 315 over the last few days from her ear infection.  Given her age and other co-morbidities, I think she is at glycemic goal A1c 7.0.  Plan -Continue metformin XR 1500 mg, sitagliptin 100 mg, and glipizide XR 10 mg at dinner -Follow-up in 3 months for A1c recheck

## 2016-08-19 NOTE — Assessment & Plan Note (Signed)
Assessment Five days ago, her dog bit her left hand and is unsure if it is infected. My exam is reassuring for no signs of a spreading infection, and the Augmentin she received in the ED for her ear infection would be the optimum treatment for the bite.  Plan -Reassured her and reviewed with her warning signs of an advancing infection for which she should seek immediate medical help: worsening redness, increasing pain, purulent drainage

## 2016-08-19 NOTE — Assessment & Plan Note (Signed)
Assessment She is interested in switching her antidepressant today as she feels duloxetine has not been of help. Her PHQ-9 scored 8 which is consistent with mild depression. She has never been on other agents in the past, and review of her PHQ scores with her is without suicidal ideation, poor appetite, difficulty sleeping.   Plan -Start sertraline 50 mg at bedtime. Reviewed adverse effects with her with plan to follow-up in 3 months or sooner should her symptoms not improve.

## 2016-08-19 NOTE — Progress Notes (Signed)
   CC: pre-operative evaluation  HPI:  [name] is a 66 y.o. woman who presents today for pre-operative evaluation. Please see assessment & plan for status of chronic medical problems.   Past Medical History:  Diagnosis Date  . Anemia   . Arthritis    "left knee" (09/08/2015)  . Brachial plexus disorders   . CHF (congestive heart failure) ([place])   . Chronic pain    neck pain, headache, neuropathy  . COPD (chronic obstructive pulmonary disease) ([place])   . Depression    "when my husband passed in 2013"  . GERD (gastroesophageal reflux disease)   . Hypertension   . Hypertriglyceridemia   . Migraine    "monthly" (09/08/2015)  . Neuromuscular disorder ([place])   . Puncture wound of foot, right 05/17/2012   Tetanus shot 3 yrs ago at [facility] in [place], per pt report   . Type II diabetes mellitus ([place]) 2010   diagnosed around 2010, only ever on metformin    Review of Systems:  Please see each problem below for a pertinent review of systems.  Physical Exam:  Vitals:   08/19/16 0953  BP: (!) 145/66  Pulse: 73  Temp: 98.2 F (36.8 C)  TempSrc: Oral  SpO2: 98%  Weight: 188 lb 1.6 oz (85.3 kg)  Height: 5\' 6"  (1.676 m)   Physical Exam  Constitutional: She is oriented to person, place, and time. No distress.  HENT:  Head: Normocephalic and atraumatic.  Eyes: Conjunctivae are normal. No scleral icterus.  Pulmonary/Chest: Effort normal. No respiratory distress.  Neurological: She is alert and oriented to person, place, and time.  Skin: She is not diaphoretic.  2 x 3 cm wound overlying first interosseous muscles of the left hand that is well-healing without signs of surrounding erythema or purulent drainage.     Assessment & Plan:   See Encounters Tab for problem based charting.  Patient discussed with Dr. Dareen Piano

## 2016-08-19 NOTE — Patient Instructions (Addendum)
Good luck with the surgery. We will send over the office note next week.  Keep up the good work with the diabetes!  Please take sertraline at bedtime and them robaxin.  We will call you for a follow-up in June.

## 2016-08-19 NOTE — Assessment & Plan Note (Signed)
Assessment She is ready to undergo left total knee arthroplasty and would like pre-operative clearance today. I evaluated her back in October 2016 for this procedure though it was postponed due to a sequence of other illnesses.   Her risk factors include history of TIA in 2014, ongoing tobacco abuse [1-2 cigarettes/day though has smoked for 50 years], well-controlled non-insulin dependent Type 2 diabetes, hypertension [] well controlled on two agents]. 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 She can walk 20 minutes with the assistance of a walker due to an unstable gait from her left knee. Myoview in 2015 was interpreted as low risk. She denies any dyspnea on exertion or chest pain.   Overall, she is low to intermediate risk for an intermediate risk procedure.  Plan -Recommend she stop smoking though will need to contact her orthopedist's office once procedure is scheduled. -Signed form that will need to be faxed over for review.

## 2016-08-22 NOTE — Progress Notes (Signed)
Internal Medicine Clinic Attending  Case discussed with Dr. Patel,Rushil soon after the resident saw the patient.  We reviewed the resident's history and exam and pertinent patient test results.  I agree with the assessment, diagnosis, and plan of care documented in the resident's note. 

## 2016-08-23 ENCOUNTER — Telehealth: Payer: Self-pay | Admitting: *Deleted

## 2016-08-23 NOTE — Telephone Encounter (Signed)
Yes, I spoke with [name] about the paperwork. I had called her yesterday to ask her about her hand wound and whether it was getting better. She was bit by her puppy, and I wanted to know if it had been vaccinated against rabies and whether she would be interested in a tetanus shot.

## 2016-08-23 NOTE — Telephone Encounter (Signed)
Pt states someone called her 3/12 and lm that she was calling for dr patel, dr patel did you have someone call pt? Also she wants to know if her paperwork was completed and faxed to ortho?

## 2016-08-23 NOTE — Telephone Encounter (Signed)
She states thank you to you and chilon Yes the puppy has had rabies vaccine No, she does not want a tetanus shot

## 2016-08-24 NOTE — Telephone Encounter (Signed)
FYI

## 2016-09-12 NOTE — H&P (Signed)
[name] is an 66 y.o. female.    Chief Complaint: left knee pain  HPI: Pt is a 66 y.o. female complaining of left knee pain for multiple years. Pain had continually increased since the beginning. X-rays in the clinic show end-stage arthritic changes of the left knee. Pt has tried various conservative treatments which have failed to alleviate their symptoms, including injections and therapy. Various options are discussed with the patient. Risks, benefits and expectations were discussed with the patient. Patient understand the risks, benefits and expectations and wishes to proceed with surgery.   PCP:  Charlott Rakes, MD  D/C Plans: Home  PMH: Past Medical History:  Diagnosis Date  . Anemia   . Arthritis    "left knee" (09/08/2015)  . Brachial plexus disorders   . CHF (congestive heart failure) ([place])   . Chronic pain    neck pain, headache, neuropathy  . COPD (chronic obstructive pulmonary disease) ([place])   . Depression    "when my husband passed in 2013"  . GERD (gastroesophageal reflux disease)   . Hypertension   . Hypertriglyceridemia   . Migraine    "monthly" (09/08/2015)  . Neuromuscular disorder ([place])   . Puncture wound of foot, right 05/17/2012   Tetanus shot 3 yrs ago at [facility] in [place], per pt report   . Type II diabetes mellitus ([place]) 2010   diagnosed around 2010, only ever on metformin    PSH: Past Surgical History:  Procedure Laterality Date  . AMPUTATION Right 05/01/2015   Procedure: Right Foot 1st Ray Amputation;  Surgeon: Newt Minion, MD;  Location: [facility];  Service: Orthopedics;  Laterality: Right;  . BILATERAL SALPINGOOPHORECTOMY Bilateral 1986   "after hemtoma evacuations; had to cut me open"  . CARPAL TUNNEL RELEASE Bilateral   . DILATION AND CURETTAGE OF UTERUS  ~ 1982   S/P miscarriage  . HEMATOMA EVACUATION Right 1986 x 2   "OVARY; w/in 1 wk after hysterectomy"  . LAPAROSCOPIC CHOLECYSTECTOMY    . SHOULDER ARTHROSCOPY W/ ROTATOR CUFF  REPAIR Bilateral   . TONSILLECTOMY    . TUBAL LIGATION    . VAGINAL HYSTERECTOMY  1986    Social History:  reports that she has been smoking Cigarettes.  She has a 4.70 pack-year smoking history. She has never used smokeless tobacco. She reports that she does not drink alcohol or use drugs.  Allergies:  Allergies  Allergen Reactions  . Flexeril [Cyclobenzaprine] Other (See Comments)    Prolonged QTc to 571, tachycardia  . Soma [Carisoprodol] Itching and Rash    Medications: No current facility-administered medications for this encounter.    Current Outpatient Prescriptions  Medication Sig Dispense Refill  . amLODipine (NORVASC) 5 MG tablet Take 1 tablet (5 mg total) by mouth daily. 30 tablet 11  . amoxicillin-clavulanate (AUGMENTIN) 875-125 MG tablet Take 1 tablet by mouth every 12 (twelve) hours. 14 tablet 0  . aspirin 81 MG tablet Take 81 mg by mouth every morning.     Marland Kitchen aspirin-acetaminophen-caffeine (EXCEDRIN MIGRAINE) 250-250-65 MG tablet Take 2 tablets by mouth as needed for headache.     . Blood Glucose Monitoring Suppl (ONETOUCH VERIO) w/Device KIT 1 each by Does not apply route 3 (three) times daily. 1 kit 0  . fluticasone (FLONASE) 50 MCG/ACT nasal spray Place 2 sprays into both nostrils daily. 16 g 0  . gabapentin (NEURONTIN) 600 MG tablet take 2 tablets by mouth three times a day 180 tablet 6  . glipiZIDE (GLUCOTROL XL)  10 MG 24 hr tablet Take 1 tablet (10 mg total) by mouth daily with supper. 90 tablet 3  . glucose blood (ONETOUCH VERIO) test strip Check blood sugar 3x/day 100 each 12  . lisinopril (PRINIVIL,ZESTRIL) 40 MG tablet take 1 tablet by mouth once daily 90 tablet 3  . metFORMIN (GLUCOPHAGE-XR) 750 MG 24 hr tablet take 2 tablets by mouth once daily WITH BREAKFAST 180 tablet 3  . methocarbamol (ROBAXIN) 750 MG tablet Take 2 tablets 4 times/day x 3 days. Then take 1 tablet 4 times/day x 4 days. 40 tablet 0  . ONETOUCH DELICA LANCETS FINE MISC Check blood sugar up  to 3 times a day 100 each 12  . pantoprazole (PROTONIX) 20 MG tablet Take 1 tablet (20 mg total) by mouth daily as needed. (Patient taking differently: Take 20 mg by mouth daily. ) 90 tablet 3  . rosuvastatin (CRESTOR) 40 MG tablet take 1 tablet by mouth once daily 90 tablet 3  . sertraline (ZOLOFT) 50 MG tablet Take 1 tablet (50 mg total) by mouth daily. 30 tablet 2  . sitaGLIPtin (JANUVIA) 100 MG tablet Take 1 tablet (100 mg total) by mouth daily. 90 tablet 3    No results found for this or any previous visit (from the past 48 hour(s)). No results found.  ROS: Pain with rom of the left lower extremity  Physical Exam:  Alert and oriented 66 y.o. female in no acute distress Cranial nerves 2-12 intact Cervical spine: full rom with no tenderness, nv intact distally Chest: active breath sounds bilaterally, no wheeze rhonchi or rales Heart: regular rate and rhythm, no murmur Abd: non tender non distended with active bowel sounds Hip is stable with rom  Left knee with moderate medial and lateral joint line tenderness nv intact distally No rashes or edema Antalgic gait  Assessment/Plan Assessment: left knee end stage osteoarthritis  Plan: Patient will undergo a left total knee arthroplasty by Dr. Veverly Fells at [facility]. Risks benefits and expectations were discussed with the patient. Patient understand risks, benefits and expectations and wishes to proceed.

## 2016-09-13 ENCOUNTER — Other Ambulatory Visit: Payer: Self-pay | Admitting: Internal Medicine

## 2016-09-13 DIAGNOSIS — E1165 Type 2 diabetes mellitus with hyperglycemia: Principal | ICD-10-CM

## 2016-09-13 DIAGNOSIS — E1140 Type 2 diabetes mellitus with diabetic neuropathy, unspecified: Secondary | ICD-10-CM

## 2016-09-13 DIAGNOSIS — IMO0002 Reserved for concepts with insufficient information to code with codable children: Secondary | ICD-10-CM

## 2016-09-13 NOTE — Telephone Encounter (Signed)
As this patient was seen by me and deemed necessary for their treatment, I will refill this prescription for glipizide.

## 2016-09-13 NOTE — Telephone Encounter (Signed)
As this patient was seen by me and deemed necessary for their treatment, I will refill this prescription for lisinopril.   

## 2016-09-21 ENCOUNTER — Encounter (HOSPITAL_COMMUNITY): Payer: Self-pay

## 2016-09-21 ENCOUNTER — Encounter (HOSPITAL_COMMUNITY)
Admission: RE | Admit: 2016-09-21 | Discharge: 2016-09-21 | Disposition: A | Discharge status: Home / Self Care | Payer: Medicare Other | Source: Ambulatory Visit | Attending: Orthopedic Surgery | Admitting: Orthopedic Surgery

## 2016-09-21 DIAGNOSIS — Z01818 Encounter for other preprocedural examination: Secondary | ICD-10-CM | POA: Diagnosis not present

## 2016-09-21 DIAGNOSIS — Z79899 Other long term (current) drug therapy: Secondary | ICD-10-CM | POA: Diagnosis not present

## 2016-09-21 DIAGNOSIS — M1712 Unilateral primary osteoarthritis, left knee: Secondary | ICD-10-CM | POA: Diagnosis not present

## 2016-09-21 HISTORY — DX: Cerebral infarction, unspecified: I63.9

## 2016-09-21 HISTORY — DX: Unspecified asthma, uncomplicated: J45.909

## 2016-09-21 LAB — SURGICAL PCR SCREEN
MRSA, PCR: NEGATIVE
Staphylococcus aureus: NEGATIVE

## 2016-09-21 LAB — GLUCOSE, CAPILLARY: Glucose-Capillary: 110 mg/dL — ABNORMAL HIGH (ref 65–99)

## 2016-09-21 LAB — CBC
HCT: 41.6 % (ref 36.0–46.0)
Hemoglobin: 13.3 g/dL (ref 12.0–15.0)
MCH: 30.5 pg (ref 26.0–34.0)
MCHC: 32 g/dL (ref 30.0–36.0)
MCV: 95.4 fL (ref 78.0–100.0)
PLATELETS: 161 10*3/uL (ref 150–400)
RBC: 4.36 MIL/uL (ref 3.87–5.11)
RDW: 14.7 % (ref 11.5–15.5)
WBC: 8.6 10*3/uL (ref 4.0–10.5)

## 2016-09-21 LAB — BASIC METABOLIC PANEL
ANION GAP: 9 (ref 5–15)
BUN: 12 mg/dL (ref 6–20)
CALCIUM: 10.1 mg/dL (ref 8.9–10.3)
CO2: 23 mmol/L (ref 22–32)
Chloride: 109 mmol/L (ref 101–111)
Creatinine, Ser: 1.08 mg/dL — ABNORMAL HIGH (ref 0.44–1.00)
GFR calc Af Amer: >60 mL/min (ref >60)
GFR, EST NON AFRICAN AMERICAN: 53 mL/min — AB (ref >60)
GLUCOSE: 126 mg/dL — AB (ref 65–99)
Potassium: 4.3 mmol/L (ref 3.5–5.1)
SODIUM: 141 mmol/L (ref 135–145)

## 2016-09-21 NOTE — Pre-Procedure Instructions (Signed)
[name]  09/21/2016      [facility] - [place], [place] - [place] [address] Phone: [phone] Fax: [phone]  [facility] [place], [place] - [place] [address] Phone: [phone] Fax: [phone]    Your procedure is scheduled on   Friday  09/30/16  Report to [facility] Admitting at 1100 A.M.  Call this number if you have problems the morning of surgery:  [phone]   Remember:  Do not eat food or drink liquids after midnight.  Take these medicines the morning of surgery with A SIP OF WATER   ALBUTEROL IF NEEDED, GABAPENTIN, PANTOPRAZOLE (PROTONIX), SERTRALINE (ZOLOFT)           (STOP 7 DAYS PRIOR TO SURGERY ASPIRIN OR ASPIRIN PRODUCTS, EXCEDRIN MIGRAINE, IBUPROFEN/ ADVIL/ MOTRIN, GOODY POWDERS, BC'S, HERBAL MEDICINES)    How to Manage Your Diabetes Before and After Surgery  Why is it important to control my blood sugar before and after surgery? . Improving blood sugar levels before and after surgery helps healing and can limit problems. . A way of improving blood sugar control is eating a healthy diet by: o  Eating less sugar and carbohydrates o  Increasing activity/exercise o  Talking with your doctor about reaching your blood sugar goals . High blood sugars (greater than 180 mg/dL) can raise your risk of infections and slow your recovery, so you will need to focus on controlling your diabetes during the weeks before surgery. . Make sure that the doctor who takes care of your diabetes knows about your planned surgery including the date and location.  How do I manage my blood sugar before surgery? . Check your blood sugar at least 4 times a day, starting 2 days before surgery, to make sure that the level is not too high or low. o Check your blood sugar the morning of your surgery when you wake up and  every 2 hours until you get to the Short Stay unit. . If your blood sugar is less than 70 mg/dL, you will need to treat for low blood sugar: o Do not take insulin. o Treat a low blood sugar (less than 70 mg/dL) with  cup of clear juice (cranberry or apple), 4 glucose tablets, OR glucose gel. o Recheck blood sugar in 15 minutes after treatment (to make sure it is greater than 70 mg/dL). If your blood sugar is not greater than 70 mg/dL on recheck, call [phone] for further instructions. . Report your blood sugar to the short stay nurse when you get to Short Stay.  . If you are admitted to the hospital after surgery: o Your blood sugar will be checked by the staff and you will probably be given insulin after surgery (instead of oral diabetes medicines) to make sure you have good blood sugar levels. o The goal for blood sugar control after surgery is 80-180 mg/dL.              WHAT DO I DO ABOUT MY DIABETES MEDICATION?   Marland Kitchen Do not take oral diabetes medicines (pills) the morning of surgery.        .   .    Other Instructions:          Patient Signature:  Date:   Nurse Signature:  Date:   Reviewed and Endorsed by [facility] Patient Education Committee, August  2015  Do not wear jewelry, make-up or nail polish.  Do not wear lotions, powders, or perfumes, or deoderant.  Do not shave 48 hours prior to surgery.  Men may shave face and neck.  Do not bring valuables to the hospital.  [facility] is not responsible for any belongings or valuables.  Contacts, dentures or bridgework may not be worn into surgery.  Leave your suitcase in the car.  After surgery it may be brought to your room.  For patients admitted to the hospital, discharge time will be determined by your treatment team.  Patients discharged the day of surgery will not be allowed to drive home.   Name and phone number of your driver:    Please read over the following fact sheets that you were  given. MRSA Information and Surgical Site Infection Prevention

## 2016-09-23 ENCOUNTER — Ambulatory Visit (INDEPENDENT_AMBULATORY_CARE_PROVIDER_SITE_OTHER): Payer: Medicare Other | Admitting: Podiatry

## 2016-09-23 ENCOUNTER — Other Ambulatory Visit: Payer: Self-pay | Admitting: Orthopedic Surgery

## 2016-09-23 ENCOUNTER — Encounter: Payer: Self-pay | Admitting: Podiatry

## 2016-09-23 DIAGNOSIS — M216X1 Other acquired deformities of right foot: Secondary | ICD-10-CM

## 2016-09-23 DIAGNOSIS — L84 Corns and callosities: Secondary | ICD-10-CM

## 2016-09-23 DIAGNOSIS — Q828 Other specified congenital malformations of skin: Secondary | ICD-10-CM

## 2016-09-23 DIAGNOSIS — E1149 Type 2 diabetes mellitus with other diabetic neurological complication: Secondary | ICD-10-CM

## 2016-09-23 DIAGNOSIS — L97511 Non-pressure chronic ulcer of other part of right foot limited to breakdown of skin: Secondary | ICD-10-CM

## 2016-09-27 NOTE — Progress Notes (Signed)
Subjective: 66 year old female presents to the office today for right foot evaluation. She recently underwent a right 5th metatarsal head resection on 06/29/16 and she states she is doing well from the surgery. She has no pain. She gets a minimal callus to this area still but is much improved. She also continues to get a callus to the right 2nd toe. Denies any redness, drainage from the area. She is requesting new diabetic shoes today as well.  Denies any systemic complaints such as fevers, chills, nausea, vomiting. No acute changes since last appointment, and no other complaints at this time.   Objective: AAO x3, NAD DP/PT pulses palpable bilaterally, CRT less than 3 seconds Sensation decreased with SWMF.  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 There is prominence of the metatarsal heads plantar.  Hyperkeratotic tissue present right submet 5 and distal 2nd toe. Upon debridement, no underlying ulceration, drainage, or signs of infection. Overall, these areas both are much improved compared to previous.  Incisions from prior surgery are well healed.  No open lesions or pre-ulcerative lesions.  No pain with calf compression, swelling, warmth, erythema  Assessment: Hyperkeratotic pre-ulcerative lesions x 2; requesting diabetic shoes  Plan: -All treatment options discussed with the patient including all alternatives, risks, complications.  -Lesions sharply debrided x 2 without complications or bleeding -Given digital deformity, history of ulcer and amputation, I agree to diabetic shoes. Paperwork was completed today pre-certification.  -RTC in 3 months or sooner if needed.  -Patient encouraged to call the office with any questions, concerns, change in symptoms.   Celesta Gentile, DPM

## 2016-09-29 MED ORDER — TRANEXAMIC ACID 1000 MG/10ML IV SOLN
1000.0000 mg | INTRAVENOUS | Status: AC
  Administered 2016-09-30: 1000 mg via INTRAVENOUS
  Filled 2016-09-29: qty 10

## 2016-09-30 ENCOUNTER — Encounter (HOSPITAL_COMMUNITY): Payer: Self-pay | Admitting: Anesthesiology

## 2016-09-30 ENCOUNTER — Inpatient Hospital Stay (HOSPITAL_COMMUNITY): Payer: Medicare Other | Admitting: Anesthesiology

## 2016-09-30 ENCOUNTER — Encounter (HOSPITAL_COMMUNITY)
Admission: RE | Disposition: A | Discharge status: Home Health | Payer: Self-pay | Source: Ambulatory Visit | Attending: Orthopedic Surgery

## 2016-09-30 ENCOUNTER — Inpatient Hospital Stay (HOSPITAL_COMMUNITY)
Admission: RE | Admit: 2016-09-30 | Discharge: 2016-10-06 | DRG: 470 | Disposition: A | Discharge status: Home Health | Payer: Medicare Other | Source: Ambulatory Visit | Attending: Orthopedic Surgery | Admitting: Orthopedic Surgery

## 2016-09-30 DIAGNOSIS — E1140 Type 2 diabetes mellitus with diabetic neuropathy, unspecified: Secondary | ICD-10-CM | POA: Diagnosis present

## 2016-09-30 DIAGNOSIS — G8918 Other acute postprocedural pain: Secondary | ICD-10-CM | POA: Diagnosis not present

## 2016-09-30 DIAGNOSIS — Z7984 Long term (current) use of oral hypoglycemic drugs: Secondary | ICD-10-CM

## 2016-09-30 DIAGNOSIS — K219 Gastro-esophageal reflux disease without esophagitis: Secondary | ICD-10-CM | POA: Diagnosis present

## 2016-09-30 DIAGNOSIS — F17210 Nicotine dependence, cigarettes, uncomplicated: Secondary | ICD-10-CM | POA: Diagnosis present

## 2016-09-30 DIAGNOSIS — F329 Major depressive disorder, single episode, unspecified: Secondary | ICD-10-CM | POA: Diagnosis present

## 2016-09-30 DIAGNOSIS — I959 Hypotension, unspecified: Secondary | ICD-10-CM | POA: Diagnosis not present

## 2016-09-30 DIAGNOSIS — J449 Chronic obstructive pulmonary disease, unspecified: Secondary | ICD-10-CM | POA: Diagnosis present

## 2016-09-30 DIAGNOSIS — Z7982 Long term (current) use of aspirin: Secondary | ICD-10-CM | POA: Diagnosis not present

## 2016-09-30 DIAGNOSIS — Z79899 Other long term (current) drug therapy: Secondary | ICD-10-CM | POA: Diagnosis not present

## 2016-09-30 DIAGNOSIS — Z96652 Presence of left artificial knee joint: Secondary | ICD-10-CM

## 2016-09-30 DIAGNOSIS — I110 Hypertensive heart disease with heart failure: Secondary | ICD-10-CM | POA: Diagnosis present

## 2016-09-30 DIAGNOSIS — Z888 Allergy status to other drugs, medicaments and biological substances status: Secondary | ICD-10-CM | POA: Diagnosis not present

## 2016-09-30 DIAGNOSIS — M1712 Unilateral primary osteoarthritis, left knee: Secondary | ICD-10-CM | POA: Diagnosis not present

## 2016-09-30 DIAGNOSIS — R339 Retention of urine, unspecified: Secondary | ICD-10-CM | POA: Diagnosis not present

## 2016-09-30 DIAGNOSIS — F17200 Nicotine dependence, unspecified, uncomplicated: Secondary | ICD-10-CM | POA: Diagnosis not present

## 2016-09-30 DIAGNOSIS — G8929 Other chronic pain: Secondary | ICD-10-CM | POA: Diagnosis present

## 2016-09-30 HISTORY — PX: TOTAL KNEE ARTHROPLASTY: SHX125

## 2016-09-30 LAB — GLUCOSE, CAPILLARY
GLUCOSE-CAPILLARY: 106 mg/dL — AB (ref 65–99)
GLUCOSE-CAPILLARY: 87 mg/dL (ref 65–99)
GLUCOSE-CAPILLARY: 114 mg/dL — AB (ref 65–99)
Glucose-Capillary: 127 mg/dL — ABNORMAL HIGH (ref 65–99)

## 2016-09-30 SURGERY — ARTHROPLASTY, KNEE, TOTAL
Anesthesia: General | Site: Knee | Laterality: Left

## 2016-09-30 MED ORDER — ACETAMINOPHEN 650 MG RE SUPP: 650.0000 mg | Freq: Four times a day (QID) | RECTAL | Status: DC | PRN

## 2016-09-30 MED ORDER — INSULIN ASPART 100 UNIT/ML ~~LOC~~ SOLN
0.0000 [IU] | Freq: Three times a day (TID) | SUBCUTANEOUS | Status: DC
  Administered 2016-10-01: 2 [IU] via SUBCUTANEOUS
  Administered 2016-10-02: 3 [IU] via SUBCUTANEOUS
  Administered 2016-10-03: 2 [IU] via SUBCUTANEOUS
  Administered 2016-10-03: 3 [IU] via SUBCUTANEOUS
  Administered 2016-10-04: 2 [IU] via SUBCUTANEOUS
  Administered 2016-10-06: 5 [IU] via SUBCUTANEOUS

## 2016-09-30 MED ORDER — GABAPENTIN 600 MG PO TABS
1200.0000 mg | ORAL_TABLET | Freq: Three times a day (TID) | ORAL | Status: DC
  Administered 2016-09-30 – 2016-10-06 (×16): 1200 mg via ORAL
  Filled 2016-09-30 (×16): qty 2

## 2016-09-30 MED ORDER — SERTRALINE HCL 50 MG PO TABS
50.0000 mg | ORAL_TABLET | Freq: Every day | ORAL | Status: DC
  Administered 2016-10-01 – 2016-10-06 (×6): 50 mg via ORAL
  Filled 2016-09-30 (×6): qty 1

## 2016-09-30 MED ORDER — MIDAZOLAM HCL 2 MG/2ML IJ SOLN
1.0000 mg | Freq: Once | INTRAMUSCULAR | Status: AC
  Administered 2016-09-30: 1 mg via INTRAVENOUS

## 2016-09-30 MED ORDER — LACTATED RINGERS IV SOLN
INTRAVENOUS | Status: DC
  Administered 2016-09-30 (×2): via INTRAVENOUS

## 2016-09-30 MED ORDER — GLIPIZIDE ER 10 MG PO TB24
10.0000 mg | ORAL_TABLET | Freq: Every day | ORAL | Status: DC
  Administered 2016-10-01 – 2016-10-05 (×5): 10 mg via ORAL
  Filled 2016-09-30 (×6): qty 1

## 2016-09-30 MED ORDER — 0.9 % SODIUM CHLORIDE (POUR BTL) OPTIME
TOPICAL | Status: DC | PRN
  Administered 2016-09-30: 1000 mL

## 2016-09-30 MED ORDER — POLYETHYLENE GLYCOL 3350 17 G PO PACK
17.0000 g | PACK | Freq: Every day | ORAL | Status: DC | PRN
  Administered 2016-10-05: 17 g via ORAL
  Filled 2016-09-30 (×2): qty 1

## 2016-09-30 MED ORDER — ROPIVACAINE HCL 5 MG/ML IJ SOLN
INTRAMUSCULAR | Status: DC | PRN
  Administered 2016-09-30: 20 mL via PERINEURAL

## 2016-09-30 MED ORDER — HYDROMORPHONE HCL 1 MG/ML IJ SOLN
INTRAMUSCULAR | Status: AC
  Filled 2016-09-30: qty 0.5

## 2016-09-30 MED ORDER — METHOCARBAMOL 500 MG PO TABS
ORAL_TABLET | ORAL | Status: AC
  Administered 2016-09-30: 500 mg via ORAL
  Filled 2016-09-30: qty 1

## 2016-09-30 MED ORDER — ASPIRIN 81 MG PO CHEW
81.0000 mg | CHEWABLE_TABLET | Freq: Two times a day (BID) | ORAL | Status: DC
  Administered 2016-09-30 – 2016-10-06 (×12): 81 mg via ORAL
  Filled 2016-09-30 (×12): qty 1

## 2016-09-30 MED ORDER — PHENYLEPHRINE HCL 10 MG/ML IJ SOLN
INTRAMUSCULAR | Status: DC | PRN
  Administered 2016-09-30: 25 ug/min via INTRAVENOUS

## 2016-09-30 MED ORDER — FENTANYL CITRATE (PF) 100 MCG/2ML IJ SOLN
50.0000 ug | Freq: Once | INTRAMUSCULAR | Status: AC
  Administered 2016-09-30: 50 ug via INTRAVENOUS

## 2016-09-30 MED ORDER — OXYCODONE HCL 5 MG/5ML PO SOLN: 5.0000 mg | Freq: Once | ORAL | Status: AC | PRN

## 2016-09-30 MED ORDER — INSULIN ASPART 100 UNIT/ML ~~LOC~~ SOLN
4.0000 [IU] | Freq: Three times a day (TID) | SUBCUTANEOUS | Status: DC
  Administered 2016-10-02 – 2016-10-05 (×6): 4 [IU] via SUBCUTANEOUS

## 2016-09-30 MED ORDER — CEFAZOLIN SODIUM-DEXTROSE 2-4 GM/100ML-% IV SOLN
2.0000 g | INTRAVENOUS | Status: AC
  Administered 2016-09-30: 2 g via INTRAVENOUS

## 2016-09-30 MED ORDER — MIDAZOLAM HCL 2 MG/2ML IJ SOLN
INTRAMUSCULAR | Status: AC
  Administered 2016-09-30: 1 mg via INTRAVENOUS
  Filled 2016-09-30: qty 2

## 2016-09-30 MED ORDER — PANTOPRAZOLE SODIUM 20 MG PO TBEC
20.0000 mg | DELAYED_RELEASE_TABLET | Freq: Every day | ORAL | Status: DC
  Administered 2016-10-01 – 2016-10-06 (×6): 20 mg via ORAL
  Filled 2016-09-30 (×6): qty 1

## 2016-09-30 MED ORDER — PROPOFOL 10 MG/ML IV BOLUS
INTRAVENOUS | Status: DC | PRN
  Administered 2016-09-30: 160 mg via INTRAVENOUS

## 2016-09-30 MED ORDER — OXYCODONE HCL 5 MG PO TABS
ORAL_TABLET | ORAL | Status: AC
  Administered 2016-09-30: 5 mg via ORAL
  Filled 2016-09-30: qty 1

## 2016-09-30 MED ORDER — METOCLOPRAMIDE HCL 5 MG PO TABS: 5.0000 mg | ORAL_TABLET | Freq: Three times a day (TID) | ORAL | Status: DC | PRN

## 2016-09-30 MED ORDER — OXYCODONE HCL 5 MG PO TABS
5.0000 mg | ORAL_TABLET | Freq: Once | ORAL | Status: AC | PRN
  Administered 2016-09-30: 5 mg via ORAL

## 2016-09-30 MED ORDER — LINAGLIPTIN 5 MG PO TABS
5.0000 mg | ORAL_TABLET | Freq: Every day | ORAL | Status: DC
  Administered 2016-10-01 – 2016-10-05 (×5): 5 mg via ORAL
  Filled 2016-09-30 (×5): qty 1

## 2016-09-30 MED ORDER — OXYCODONE HCL 5 MG PO TABS: 5.0000 mg | ORAL_TABLET | ORAL | 0 refills | Status: DC | PRN

## 2016-09-30 MED ORDER — METHOCARBAMOL 1000 MG/10ML IJ SOLN
500.0000 mg | Freq: Four times a day (QID) | INTRAVENOUS | Status: DC | PRN
  Filled 2016-09-30: qty 5

## 2016-09-30 MED ORDER — INSULIN ASPART 100 UNIT/ML ~~LOC~~ SOLN
0.0000 [IU] | Freq: Every day | SUBCUTANEOUS | Status: DC
Start: 2016-09-30 — End: 2016-10-06

## 2016-09-30 MED ORDER — CHLORHEXIDINE GLUCONATE 4 % EX LIQD: 60.0000 mL | Freq: Once | CUTANEOUS | Status: DC

## 2016-09-30 MED ORDER — METOCLOPRAMIDE HCL 5 MG/ML IJ SOLN: 5.0000 mg | Freq: Three times a day (TID) | INTRAMUSCULAR | Status: DC | PRN

## 2016-09-30 MED ORDER — ONDANSETRON HCL 4 MG/2ML IJ SOLN
4.0000 mg | Freq: Four times a day (QID) | INTRAMUSCULAR | Status: DC | PRN
  Administered 2016-10-01: 4 mg via INTRAVENOUS
  Filled 2016-09-30: qty 2

## 2016-09-30 MED ORDER — PHENOL 1.4 % MT LIQD: 1.0000 | OROMUCOSAL | Status: DC | PRN

## 2016-09-30 MED ORDER — PROMETHAZINE HCL 25 MG/ML IJ SOLN: 6.2500 mg | INTRAMUSCULAR | Status: DC | PRN

## 2016-09-30 MED ORDER — ALBUTEROL SULFATE HFA 108 (90 BASE) MCG/ACT IN AERS: 1.0000 | INHALATION_SPRAY | Freq: Four times a day (QID) | RESPIRATORY_TRACT | Status: DC | PRN

## 2016-09-30 MED ORDER — FERROUS SULFATE 325 (65 FE) MG PO TABS
325.0000 mg | ORAL_TABLET | Freq: Three times a day (TID) | ORAL | Status: DC
  Administered 2016-09-30 – 2016-10-06 (×17): 325 mg via ORAL
  Filled 2016-09-30 (×17): qty 1

## 2016-09-30 MED ORDER — OXYCODONE HCL 5 MG PO TABS
5.0000 mg | ORAL_TABLET | ORAL | Status: DC | PRN
  Administered 2016-09-30: 5 mg via ORAL
  Administered 2016-10-01 (×2): 10 mg via ORAL
  Administered 2016-10-02 – 2016-10-04 (×5): 5 mg via ORAL
  Administered 2016-10-04 – 2016-10-06 (×6): 10 mg via ORAL
  Filled 2016-09-30: qty 1
  Filled 2016-09-30: qty 2
  Filled 2016-09-30: qty 1
  Filled 2016-09-30 (×4): qty 2
  Filled 2016-09-30 (×2): qty 1
  Filled 2016-09-30: qty 2
  Filled 2016-09-30: qty 1
  Filled 2016-09-30 (×2): qty 2
  Filled 2016-09-30: qty 1

## 2016-09-30 MED ORDER — METFORMIN HCL ER 750 MG PO TB24
750.0000 mg | ORAL_TABLET | Freq: Every day | ORAL | Status: DC
  Administered 2016-10-01 – 2016-10-05 (×5): 750 mg via ORAL
  Filled 2016-09-30 (×3): qty 1
  Filled 2016-09-30: qty 1.5
  Filled 2016-09-30 (×3): qty 1
  Filled 2016-09-30 (×2): qty 1.5

## 2016-09-30 MED ORDER — DOCUSATE SODIUM 100 MG PO CAPS
100.0000 mg | ORAL_CAPSULE | Freq: Two times a day (BID) | ORAL | Status: DC
  Administered 2016-09-30 – 2016-10-06 (×12): 100 mg via ORAL
  Filled 2016-09-30 (×12): qty 1

## 2016-09-30 MED ORDER — FENTANYL CITRATE (PF) 250 MCG/5ML IJ SOLN
INTRAMUSCULAR | Status: AC
  Filled 2016-09-30: qty 5

## 2016-09-30 MED ORDER — TRANEXAMIC ACID 1000 MG/10ML IV SOLN
1000.0000 mg | Freq: Once | INTRAVENOUS | Status: AC
  Administered 2016-09-30: 1000 mg via INTRAVENOUS
  Filled 2016-09-30: qty 10

## 2016-09-30 MED ORDER — SODIUM CHLORIDE 0.9 % IR SOLN
Status: DC | PRN
  Administered 2016-09-30: 3000 mL

## 2016-09-30 MED ORDER — FLUTICASONE PROPIONATE 50 MCG/ACT NA SUSP
2.0000 | Freq: Every day | NASAL | Status: DC
  Administered 2016-10-01 – 2016-10-06 (×6): 2 via NASAL
  Filled 2016-09-30: qty 16

## 2016-09-30 MED ORDER — MENTHOL 3 MG MT LOZG: 1.0000 | LOZENGE | OROMUCOSAL | Status: DC | PRN

## 2016-09-30 MED ORDER — GLYCOPYRROLATE 0.2 MG/ML IJ SOLN
INTRAMUSCULAR | Status: DC | PRN
  Administered 2016-09-30: 0.2 mg via INTRAVENOUS

## 2016-09-30 MED ORDER — FENTANYL CITRATE (PF) 100 MCG/2ML IJ SOLN
INTRAMUSCULAR | Status: AC
  Administered 2016-09-30: 50 ug via INTRAVENOUS
  Filled 2016-09-30: qty 2

## 2016-09-30 MED ORDER — METHOCARBAMOL 500 MG PO TABS
500.0000 mg | ORAL_TABLET | Freq: Four times a day (QID) | ORAL | Status: DC | PRN
  Administered 2016-09-30 – 2016-10-06 (×8): 500 mg via ORAL
  Filled 2016-09-30 (×7): qty 1

## 2016-09-30 MED ORDER — ASPIRIN 81 MG PO TABS: 81.0000 mg | ORAL_TABLET | ORAL | Status: DC

## 2016-09-30 MED ORDER — ASPIRIN-ACETAMINOPHEN-CAFFEINE 250-250-65 MG PO TABS
2.0000 | ORAL_TABLET | Freq: Three times a day (TID) | ORAL | Status: DC | PRN
  Filled 2016-09-30: qty 2

## 2016-09-30 MED ORDER — ONDANSETRON HCL 4 MG/2ML IJ SOLN
INTRAMUSCULAR | Status: AC
  Filled 2016-09-30: qty 2

## 2016-09-30 MED ORDER — ROSUVASTATIN CALCIUM 10 MG PO TABS
40.0000 mg | ORAL_TABLET | Freq: Every day | ORAL | Status: DC
  Administered 2016-09-30 – 2016-10-06 (×7): 40 mg via ORAL
  Filled 2016-09-30 (×7): qty 4

## 2016-09-30 MED ORDER — METHOCARBAMOL 500 MG PO TABS: 500.0000 mg | ORAL_TABLET | Freq: Four times a day (QID) | ORAL | 0 refills | Status: DC | PRN

## 2016-09-30 MED ORDER — MORPHINE SULFATE (PF) 2 MG/ML IV SOLN
2.0000 mg | INTRAVENOUS | Status: DC | PRN
  Administered 2016-10-01 – 2016-10-05 (×2): 2 mg via INTRAVENOUS
  Filled 2016-09-30 (×2): qty 1

## 2016-09-30 MED ORDER — HYDROMORPHONE HCL 1 MG/ML IJ SOLN
INTRAMUSCULAR | Status: AC
Start: 2016-09-30 — End: 2016-09-30
  Administered 2016-09-30: 0.5 mg via INTRAVENOUS
  Filled 2016-09-30: qty 0.5

## 2016-09-30 MED ORDER — PROPOFOL 10 MG/ML IV BOLUS
INTRAVENOUS | Status: AC
  Filled 2016-09-30: qty 20

## 2016-09-30 MED ORDER — ONETOUCH VERIO W/DEVICE KIT: 1.0000 | PACK | Freq: Three times a day (TID) | Status: DC

## 2016-09-30 MED ORDER — ONDANSETRON HCL 4 MG/2ML IJ SOLN
INTRAMUSCULAR | Status: DC | PRN
  Administered 2016-09-30: 4 mg via INTRAVENOUS

## 2016-09-30 MED ORDER — CEFAZOLIN SODIUM-DEXTROSE 2-4 GM/100ML-% IV SOLN
2.0000 g | Freq: Four times a day (QID) | INTRAVENOUS | Status: AC
  Administered 2016-09-30 – 2016-10-01 (×2): 2 g via INTRAVENOUS
  Filled 2016-09-30 (×2): qty 100

## 2016-09-30 MED ORDER — ONDANSETRON HCL 4 MG PO TABS: 4.0000 mg | ORAL_TABLET | Freq: Four times a day (QID) | ORAL | Status: DC | PRN

## 2016-09-30 MED ORDER — CEFAZOLIN SODIUM-DEXTROSE 2-4 GM/100ML-% IV SOLN
INTRAVENOUS | Status: AC
  Filled 2016-09-30: qty 100

## 2016-09-30 MED ORDER — HYDROMORPHONE HCL 1 MG/ML IJ SOLN
0.2500 mg | INTRAMUSCULAR | Status: DC | PRN
  Administered 2016-09-30 (×2): 0.5 mg via INTRAVENOUS

## 2016-09-30 MED ORDER — SODIUM CHLORIDE 0.9 % IV SOLN
INTRAVENOUS | Status: DC
  Administered 2016-09-30 – 2016-10-01 (×2): via INTRAVENOUS

## 2016-09-30 MED ORDER — LISINOPRIL 40 MG PO TABS
40.0000 mg | ORAL_TABLET | Freq: Every day | ORAL | Status: DC
  Administered 2016-10-01 – 2016-10-06 (×5): 40 mg via ORAL
  Filled 2016-09-30 (×7): qty 1

## 2016-09-30 MED ORDER — ACETAMINOPHEN 325 MG PO TABS: 650.0000 mg | ORAL_TABLET | Freq: Four times a day (QID) | ORAL | Status: DC | PRN

## 2016-09-30 MED ORDER — AMLODIPINE BESYLATE 5 MG PO TABS
5.0000 mg | ORAL_TABLET | Freq: Every day | ORAL | Status: DC
  Administered 2016-10-01 – 2016-10-06 (×5): 5 mg via ORAL
  Filled 2016-09-30 (×7): qty 1

## 2016-09-30 MED ORDER — FENTANYL CITRATE (PF) 100 MCG/2ML IJ SOLN
INTRAMUSCULAR | Status: DC | PRN
  Administered 2016-09-30 (×3): 25 ug via INTRAVENOUS
  Administered 2016-09-30: 50 ug via INTRAVENOUS

## 2016-09-30 SURGICAL SUPPLY — 60 items
BANDAGE ESMARK 6X9 LF (GAUZE/BANDAGES/DRESSINGS) ×1 IMPLANT
BLADE SAG 18X100X1.27 (BLADE) IMPLANT
BLADE SAW SGTL 13X75X1.27 (BLADE) ×2 IMPLANT
BLADE SAW SGTL 18X1.27X75 (BLADE) ×2 IMPLANT
BNDG ELASTIC 6X10 VLCR STRL LF (GAUZE/BANDAGES/DRESSINGS) ×2 IMPLANT
BNDG ESMARK 6X9 LF (GAUZE/BANDAGES/DRESSINGS) ×2
BNDG GAUZE ELAST 4 BULKY (GAUZE/BANDAGES/DRESSINGS) ×4 IMPLANT
BOWL SMART MIX CTS (DISPOSABLE) ×2 IMPLANT
CAP KNEE TOTAL 3 SIGMA ×2 IMPLANT
CEMENT HV SMART SET (Cement) ×4 IMPLANT
COVER SURGICAL LIGHT HANDLE (MISCELLANEOUS) ×2 IMPLANT
CUFF TOURNIQUET SINGLE 34IN LL (TOURNIQUET CUFF) ×2 IMPLANT
CUFF TOURNIQUET SINGLE 44IN (TOURNIQUET CUFF) IMPLANT
DRAPE EXTREMITY T 121X128X90 (DRAPE) ×2 IMPLANT
DRAPE HALF SHEET 40X57 (DRAPES) ×2 IMPLANT
DRAPE U-SHAPE 47X51 STRL (DRAPES) ×2 IMPLANT
DRSG ADAPTIC 3X8 NADH LF (GAUZE/BANDAGES/DRESSINGS) ×2 IMPLANT
DRSG PAD ABDOMINAL 8X10 ST (GAUZE/BANDAGES/DRESSINGS) ×2 IMPLANT
DURAPREP 26ML APPLICATOR (WOUND CARE) ×2 IMPLANT
ELECT CAUTERY BLADE 6.4 (BLADE) ×2 IMPLANT
ELECT REM PT RETURN 9FT ADLT (ELECTROSURGICAL) ×2
ELECTRODE REM PT RTRN 9FT ADLT (ELECTROSURGICAL) ×1 IMPLANT
GAUZE SPONGE 4X4 12PLY STRL (GAUZE/BANDAGES/DRESSINGS) ×2 IMPLANT
GLOVE BIOGEL PI ORTHO PRO 7.5 (GLOVE) ×1
GLOVE BIOGEL PI ORTHO PRO SZ7 (GLOVE) ×1
GLOVE BIOGEL PI ORTHO PRO SZ8 (GLOVE) ×1
GLOVE ORTHO TXT STRL SZ7.5 (GLOVE) ×2 IMPLANT
GLOVE PI ORTHO PRO STRL 7.5 (GLOVE) ×1 IMPLANT
GLOVE PI ORTHO PRO STRL SZ7 (GLOVE) ×1 IMPLANT
GLOVE PI ORTHO PRO STRL SZ8 (GLOVE) ×1 IMPLANT
GLOVE SURG ORTHO 8.5 STRL (GLOVE) ×2 IMPLANT
GOWN STRL REUS W/ TWL XL LVL3 (GOWN DISPOSABLE) ×3 IMPLANT
GOWN STRL REUS W/TWL XL LVL3 (GOWN DISPOSABLE) ×3
HANDPIECE INTERPULSE COAX TIP (DISPOSABLE) ×1
IMMOBILIZER KNEE 22 UNIV (SOFTGOODS) ×2 IMPLANT
KIT BASIN OR (CUSTOM PROCEDURE TRAY) ×2 IMPLANT
KIT MANIFOLD (MISCELLANEOUS) ×2 IMPLANT
KIT ROOM TURNOVER OR (KITS) ×2 IMPLANT
MANIFOLD NEPTUNE II (INSTRUMENTS) ×2 IMPLANT
NS IRRIG 1000ML POUR BTL (IV SOLUTION) ×2 IMPLANT
PACK TOTAL JOINT (CUSTOM PROCEDURE TRAY) ×2 IMPLANT
PACK UNIVERSAL I (CUSTOM PROCEDURE TRAY) IMPLANT
PAD ARMBOARD 7.5X6 YLW CONV (MISCELLANEOUS) ×4 IMPLANT
PIN STEINMAN FIXATION KNEE (PIN) IMPLANT
SET HNDPC FAN SPRY TIP SCT (DISPOSABLE) ×1 IMPLANT
STRIP CLOSURE SKIN 1/2X4 (GAUZE/BANDAGES/DRESSINGS) ×4 IMPLANT
SUCTION FRAZIER HANDLE 10FR (MISCELLANEOUS) ×1
SUCTION TUBE FRAZIER 10FR DISP (MISCELLANEOUS) ×1 IMPLANT
SUT MNCRL AB 3-0 PS2 18 (SUTURE) ×2 IMPLANT
SUT VIC AB 0 CT1 27 (SUTURE) ×2
SUT VIC AB 0 CT1 27XBRD ANBCTR (SUTURE) ×2 IMPLANT
SUT VIC AB 1 CT1 27 (SUTURE) ×3
SUT VIC AB 1 CT1 27XBRD ANBCTR (SUTURE) ×3 IMPLANT
SUT VIC AB 2-0 CT1 27 (SUTURE) ×2
SUT VIC AB 2-0 CT1 TAPERPNT 27 (SUTURE) ×2 IMPLANT
TOWEL OR 17X24 6PK STRL BLUE (TOWEL DISPOSABLE) ×2 IMPLANT
TOWEL OR 17X26 10 PK STRL BLUE (TOWEL DISPOSABLE) ×2 IMPLANT
TRAY CATH 16FR W/PLASTIC CATH (SET/KITS/TRAYS/PACK) IMPLANT
TRAY FOLEY W/METER SILVER 16FR (SET/KITS/TRAYS/PACK) ×2 IMPLANT
WATER STERILE IRR 1000ML POUR (IV SOLUTION) IMPLANT

## 2016-09-30 NOTE — Anesthesia Preprocedure Evaluation (Addendum)
Anesthesia Evaluation  Patient identified by MRN, date of birth, ID band Patient awake    Reviewed: Allergy & Precautions, NPO status , Patient's Chart, lab work & pertinent test results  Airway Mallampati: II  TM Distance: >3 FB     Dental  (+) Edentulous Upper, Partial Lower   Pulmonary COPD, Current Smoker,    breath sounds clear to auscultation       Cardiovascular hypertension, Pt. on medications  Rhythm:Regular Rate:Normal     Neuro/Psych PSYCHIATRIC DISORDERS  Neuromuscular disease    GI/Hepatic Neg liver ROS, GERD  ,  Endo/Other  diabetes, Type 2  Renal/GU negative Renal ROS     Musculoskeletal  (+) Arthritis ,   Abdominal   Peds  Hematology  (+) anemia ,   Anesthesia Other Findings   Reproductive/Obstetrics                            Lab Results  Component Value Date   WBC 8.6 09/21/2016   HGB 13.3 09/21/2016   HCT 41.6 09/21/2016   MCV 95.4 09/21/2016   PLT 161 09/21/2016   Lab Results  Component Value Date   CREATININE 1.08 (H) 09/21/2016   BUN 12 09/21/2016   NA 141 09/21/2016   K 4.3 09/21/2016   CL 109 09/21/2016   CO2 23 09/21/2016    Anesthesia Physical  Anesthesia Plan  ASA: III  Anesthesia Plan: Spinal   Post-op Pain Management:  Regional for Post-op pain   Induction:   Airway Management Planned: Simple Face Mask  Additional Equipment:   Intra-op Plan:   Post-operative Plan:   Informed Consent: I have reviewed the patients History and Physical, chart, labs and discussed the procedure including the risks, benefits and alternatives for the proposed anesthesia with the patient or authorized representative who has indicated his/her understanding and acceptance.   Dental advisory given  Plan Discussed with: CRNA  Anesthesia Plan Comments:         Anesthesia Quick Evaluation

## 2016-09-30 NOTE — Op Note (Signed)
[name], [name]              ACCOUNT NO.:  [id number]  MEDICAL RECORD NO.:  [id number]  LOCATION:                               FACILITY:  [facility]  PHYSICIAN:  Doran Heater. Veverly Fells, M.D. DATE OF BIRTH:  08-31-1950  DATE OF PROCEDURE:  09/30/2016 DATE OF DISCHARGE:                              OPERATIVE REPORT   PREOPERATIVE DIAGNOSIS:  Left knee end-stage osteoarthritis.  POSTOPERATIVE DIAGNOSIS:  Left knee end-stage osteoarthritis.  PROCEDURE PERFORMED:  Left total knee replacement using DePuy Sigma rotating platform prosthesis.  ATTENDING SURGEON:  Doran Heater. Veverly Fells, MD.  ASSISTANT:  Ventura Bruns, [facility] who was scrubbed during the entire procedure and necessary for satisfactory completion of surgery.  ANESTHESIA:  We used general anesthesia plus adductor canal block.  ESTIMATED BLOOD LOSS:  Minimal.  FLUID REPLACEMENT:  1500 mL of crystalloid.  INSTRUMENT COUNTS:  Correct.  COMPLICATIONS:  No complications.  ANTIBIOTICS:  Perioperative antibiotics were given.  TOURNIQUET TIME:  1 hour and 30 minutes at 300 mmHg.  INDICATIONS:  The patient is a 66 year old female with progressive left knee pain secondary to end-stage arthritis.  The patient has bone-on- bone on x-ray and MRI and has had a failure of conservative management over several years.  She presents now with debilitating knee pain but both pain at night and rest desiring total knee arthroplasty to restore function and pain to her knee.  Informed consent obtained.  DESCRIPTION OF PROCEDURE:  After an adequate level of anesthesia achieved, the patient was positioned supine on the operating room table. Left leg was correctly identified.  A nonsterile tourniquet was placed to proximal thigh.  Left leg was sterilely prepped and draped in usual manner.  Time-out was called.  We elevated the leg and then exsanguinated using Esmarch bandage.  We then elevated the tourniquet to 300 mmHg, placed the knee in flexion.  A  longitudinal midline incision was created with the knee in flexion using a #10 blade scalpel dissection down through subcutaneous tissues.  We identified the median parapatellar tissues and performed a parapatellar arthrotomy with a fresh #10 blade scalpel.  We divided the lateral patellofemoral ligaments, we everted the patella.  We entered the distal femur with a step-cut drill.  We then placed our intramedullary guide set on initially 10 mm of resection, 5 degrees left, we did our distal cut with an oscillating saw, we then sized the femur with the anterior down sizer and we noted it to be a size 3.  We performed anterior-posterior cuts, we flushed the anterior cortex of the femur.  Next, we went ahead and did our chamfer cuts as well.  We then directed our attention, we removed the ACL, PCL meniscal tissue, subluxed the tibia anteriorly.  We performed a 90 degree perpendicular cut to the long axis of the tibia with minimal posterior slope with the posterior cruciate substituting prosthesis.  Once we had accomplished that, then we checked our flexion-extension gaps, which were symmetric at 10 mm.  Once we did our final preparation of the tibia with modular drill and keel punch, we removed excess posterior osteophytes off the posterior femur with the curved osteotome, and then we completed our  box cut for our femur.  We then trialed with the distal femoral component and noted there to be a slight flexion contracture with the 12.5 poly insert in place.  It was stable in flexion, but a little bit tight in extension, so we went ahead and decided to go back and take another millimeter off the distal femur leaving our AP cut the same and we did that resetting the block for a +1 mm and then made those cuts.  We then re-did our chamfer cuts and then re-did our box cut.  With the femoral component, trial component was back in place.  We did have a nice extension and excellent stability  in flexion.  To resurface the patella, we used a 32 patellar button and drilled lugs for that and cut using the patellar clamp and oscillating saw.  We then thoroughly irrigated all the bony surfaces and dried them thoroughly.  We then used a vacuum mixing techniques with high viscosity cement and cemented the components into place, placed in extension with a 12.5 poly insert.  We felt like that was the best size fit for her. Once the cement was allowed to harden, we used to remove the excess cement with 0.25-inch curved osteotome.  We then placed a real 12.5 poly insert and reduced the knee with a nice little pop.  We were able to get the full extension and flexion to 120 degrees, nice patellar tracking, no-touch technique.  We irrigated thoroughly with a pulse irrigator and then used #1 Vicryl suture for interrupted closure for the medial parapatellar arthrotomy.  Next, we closed the subcu with 2-0 Vicryl and 4-0 Monocryl for skin.  Steri-Strips was applied followed by sterile dressing.  The patient tolerated the surgery well.     Doran Heater. Veverly Fells, M.D.   ______________________________ Doran Heater. Veverly Fells, M.D.    SRN/MEDQ  D:  09/30/2016  T:  09/30/2016  Job:  [id number]

## 2016-09-30 NOTE — Anesthesia Postprocedure Evaluation (Signed)
Anesthesia Post Note  Patient: [name]  Procedure(s) Performed: Procedure(s) (LRB): LEFT TOTAL KNEE ARTHROPLASTY (Left)  Patient location during evaluation: PACU Anesthesia Type: General and Regional Level of consciousness: awake and alert Pain management: pain level controlled Vital Signs Assessment: post-procedure vital signs reviewed and stable Respiratory status: spontaneous breathing, nonlabored ventilation, respiratory function stable and patient connected to nasal cannula oxygen Cardiovascular status: blood pressure returned to baseline and stable Postop Assessment: no signs of nausea or vomiting Anesthetic complications: no       Last Vitals:  Vitals:   09/30/16 1658 09/30/16 1700  BP:    Pulse: 74 75  Resp: 17 19  Temp:      Last Pain:  Vitals:   09/30/16 1700  PainSc: Asleep                 Trenten Watchman,W. EDMOND

## 2016-09-30 NOTE — Progress Notes (Signed)
Orthopedic Tech Progress Note Patient Details:  [name] 05/18/1951 [id number]  CPM Left Knee CPM Left Knee: On Left Knee Flexion (Degrees): 60 Left Knee Extension (Degrees): 0 Additional Comments: applied cpm to pt left leg/knee at 0-60 degrees.  provided bone foam zero degree at bedside.  pt tolerated well.    Kristopher Oppenheim 09/30/2016, 5:07 PM

## 2016-09-30 NOTE — Discharge Instructions (Signed)
Ice to the knee as much as you can.  Do NOT prop anything under the knee.  May elevate with a blanket or towel under the ankle or calf to keep the knee straight.  Keep the incision covered for one week and dry.  Ok to get it wet in the shower after one week.  Ok to put full weight on the left knee/leg.  Wear the knee immobilizer at night to keep the knee straight regardless of how you sleep.  Do exercises 4-5 times per day as instructed   Call [phone] for office appointment in two weeks

## 2016-09-30 NOTE — Anesthesia Procedure Notes (Signed)
Anesthesia Regional Block: Adductor canal block   Pre-Anesthetic Checklist: ,, timeout performed, Correct Patient, Correct Site, Correct Laterality, Correct Procedure, Correct Position, site marked, Risks and benefits discussed,  Surgical consent,  Pre-op evaluation,  At surgeon's request and post-op pain management  Laterality: Left  Prep: Dura Prep       Needles:  Injection technique: Single-shot  Needle Type: Stimiplex     Needle Length: 4cm  Needle Gauge: 21     Additional Needles:   Procedures: ultrasound guided,,,,,,,,  Narrative:  Start time: 09/30/2016 12:08 PM End time: 09/30/2016 12:13 PM Injection made incrementally with aspirations every 5 mL.  Performed by: Personally  Anesthesiologist: Candida Peeling RAY

## 2016-09-30 NOTE — Brief Op Note (Signed)
09/30/2016  3:48 PM  PATIENT:  [name]  66 y.o. female  PRE-OPERATIVE DIAGNOSIS:  Left knee endstage osteoarthritis  POST-OPERATIVE DIAGNOSIS:  Left knee endstage osteoarthritis  PROCEDURE:  Procedure(s): LEFT TOTAL KNEE ARTHROPLASTY (Left) DePuy Sigma RP  SURGEON:  Surgeon(s) and Role:    * Netta Cedars, MD - Primary  PHYSICIAN ASSISTANT:   ASSISTANTS: Abbott Pao dixon, PA-C   ANESTHESIA:   regional and general  EBL:  Total I/O In: 1000 [I.V.:1000] Out: 175 [Blood:175]  BLOOD ADMINISTERED:none  DRAINS: none   LOCAL MEDICATIONS USED:  NONE  SPECIMEN:  No Specimen  DISPOSITION OF SPECIMEN:  N/A  COUNTS:  YES  TOURNIQUET:   Total Tourniquet Time Documented: Thigh (Left) - 98 minutes Total: Thigh (Left) - 98 minutes   DICTATION: .Other Dictation: Dictation Number [id number]  PLAN OF CARE: Admit to inpatient   PATIENT DISPOSITION:  PACU - hemodynamically stable.   Delay start of Pharmacological VTE agent (>24hrs) due to surgical blood loss or risk of bleeding: no

## 2016-09-30 NOTE — Transfer of Care (Signed)
Immediate Anesthesia Transfer of Care Note  Patient: [name]  Procedure(s) Performed: Procedure(s): LEFT TOTAL KNEE ARTHROPLASTY (Left)  Patient Location: PACU  Anesthesia Type:GA combined with regional for post-op pain  Level of Consciousness: awake, alert  and oriented  Airway & Oxygen Therapy: Patient Spontanous Breathing and Patient connected to nasal cannula oxygen  Post-op Assessment: Report given to RN, Post -op Vital signs reviewed and stable and Patient moving all extremities  Post vital signs: Reviewed and stable  Last Vitals:  Vitals:   09/30/16 1216 09/30/16 1533  BP: (!) 95/51   Pulse: 72   Resp: (!) 23   Temp:  36.2 C    Last Pain: There were no vitals filed for this visit.       Complications: No apparent anesthesia complications

## 2016-09-30 NOTE — Op Note (Deleted)
  The note originally documented on this encounter has been moved the the encounter in which it belongs.  

## 2016-09-30 NOTE — Interval H&P Note (Signed)
History and Physical Interval Note:  09/30/2016 12:40 PM  [name]  has presented today for surgery, with the diagnosis of Left knee endstage osteoarthritis  The various methods of treatment have been discussed with the patient and family. After consideration of risks, benefits and other options for treatment, the patient has consented to  Procedure(s): LEFT TOTAL KNEE ARTHROPLASTY (Left) as a surgical intervention .  The patient's history has been reviewed, patient examined, no change in status, stable for surgery.  I have reviewed the patient's chart and labs.  Questions were answered to the patient's satisfaction.     [name],STEVEN R

## 2016-09-30 NOTE — Anesthesia Procedure Notes (Addendum)
Procedure Name: LMA Insertion Date/Time: 09/30/2016 1:30 PM Performed by: Rush Farmer E Pre-anesthesia Checklist: Patient identified, Emergency Drugs available, Suction available and Patient being monitored Patient Re-evaluated:Patient Re-evaluated prior to inductionOxygen Delivery Method: Circle system utilized Preoxygenation: Pre-oxygenation with 100% oxygen Intubation Type: IV induction LMA: LMA inserted LMA Size: 4.0 Number of attempts: 1 Placement Confirmation: positive ETCO2 and breath sounds checked- equal and bilateral Tube secured with: Tape Dental Injury: Teeth and Oropharynx as per pre-operative assessment

## 2016-09-30 NOTE — Progress Notes (Signed)
Pt's BP since arriving to floor from PACU has been ranging 80-90s/50 with HR in 70s. BP was in the same range while in PACU. Pt is asymptomatic while at rest. Spoke with Merla Riches, PA, no orders received. Will update him if she becomes tachycardic.  [place], Jerry Caras

## 2016-10-01 LAB — BASIC METABOLIC PANEL
Anion gap: 8 (ref 5–15)
BUN: 12 mg/dL (ref 6–20)
CHLORIDE: 108 mmol/L (ref 101–111)
CO2: 21 mmol/L — ABNORMAL LOW (ref 22–32)
CREATININE: 1.31 mg/dL — AB (ref 0.44–1.00)
Calcium: 8.4 mg/dL — ABNORMAL LOW (ref 8.9–10.3)
GFR, EST AFRICAN AMERICAN: 49 mL/min — AB (ref >60)
GFR, EST NON AFRICAN AMERICAN: 42 mL/min — AB (ref >60)
Glucose, Bld: 116 mg/dL — ABNORMAL HIGH (ref 65–99)
POTASSIUM: 4.1 mmol/L (ref 3.5–5.1)
SODIUM: 137 mmol/L (ref 135–145)

## 2016-10-01 LAB — CBC
HCT: 35 % — ABNORMAL LOW (ref 36.0–46.0)
HEMOGLOBIN: 11.1 g/dL — AB (ref 12.0–15.0)
MCH: 30.6 pg (ref 26.0–34.0)
MCHC: 31.7 g/dL (ref 30.0–36.0)
MCV: 96.4 fL (ref 78.0–100.0)
Platelets: 111 10*3/uL — ABNORMAL LOW (ref 150–400)
RBC: 3.63 MIL/uL — ABNORMAL LOW (ref 3.87–5.11)
RDW: 15 % (ref 11.5–15.5)
WBC: 10.4 10*3/uL (ref 4.0–10.5)

## 2016-10-01 LAB — GLUCOSE, CAPILLARY
GLUCOSE-CAPILLARY: 117 mg/dL — AB (ref 65–99)
GLUCOSE-CAPILLARY: 150 mg/dL — AB (ref 65–99)
GLUCOSE-CAPILLARY: 90 mg/dL (ref 65–99)
Glucose-Capillary: 145 mg/dL — ABNORMAL HIGH (ref 65–99)

## 2016-10-01 NOTE — Evaluation (Signed)
Physical Therapy Evaluation Patient Details Name: [name] MRN: [id number] DOB: Jan 24, 1951 Today's Date: 10/01/2016   History of Present Illness  Pt is a 66 yo female admitted on 09/30/16 for L TKA. 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   Clinical Impression  Pt is POD 1 and moving very slowly with therapy and limited by pain. Prior to admission, pt was living with her family in a single level home and pt reports she will have a lot of help from family when she returns home. Pt requires Mod A for mobility this session due to pain and lethargy which she attributes to lack of sleep the night before. Pt will benefit from continued acute PT services in order to address the below deficits prior to discharge to venue recommended below.     Follow Up Recommendations Home health PT;Supervision for mobility/OOB    Equipment Recommendations  Rolling walker with 5" wheels;3in1 (PT)    Recommendations for Other Services       Precautions / Restrictions Precautions Precautions: Knee Precaution Booklet Issued: Yes (comment) Precaution Comments: handout given and reviewed no pillow under knee Required Braces or Orthoses: Knee Immobilizer - Left Knee Immobilizer - Left: On at all times Restrictions Weight Bearing Restrictions: Yes LLE Weight Bearing: Weight bearing as tolerated      Mobility  Bed Mobility Overal bed mobility: Needs Assistance Bed Mobility: Supine to Sit     Supine to sit: Mod assist;HOB elevated     General bed mobility comments: Mod A to bring Le's EOB and to assist at trunk to sit upright at EOB. heavy reliance on railings.   Transfers Overall transfer level: Needs assistance Equipment used: Rolling walker (2 wheeled) Transfers: Sit to/from [facility] Sit to Stand: Mod assist;From elevated surface Stand pivot transfers: Min assist       General transfer comment: Mod A with 3 attempts to  stand. pt is able to stand on 3rd attempt and transitions to Min A as she comes up to RW. Min A throughout transfer to recliner.    Ambulation/Gait             General Gait Details: did not attempt this session due to pain  Stairs            Wheelchair Mobility    Modified Rankin (Stroke Patients Only)       Balance Overall balance assessment: Needs assistance Sitting-balance support: Single extremity supported;Feet supported Sitting balance-Leahy Scale: Fair     Standing balance support: Bilateral upper extremity supported;During functional activity Standing balance-Leahy Scale: Poor Standing balance comment: heavy reliance on Rw for stability in standing                             Pertinent Vitals/Pain Pain Assessment: 0-10 Pain Score: 8  Pain Location: left knee with mobility Pain Descriptors / Indicators: Aching;Grimacing;Guarding Pain Intervention(s): Monitored during session;Repositioned;Patient requesting pain meds-RN notified;Ice applied    Home Living Family/patient expects to be discharged to:: Private residence Living Arrangements: Spouse/significant other;Children;Other (Comment) (daughter, granddaughter, fiance, great grand daughter) Available Help at Discharge: Family;Available 24 hours/day Type of Home: House Home Access: Stairs to enter Entrance Stairs-Rails: None Entrance Stairs-Number of Steps: 2 Home Layout: One level Home Equipment: Bedside commode;Walker - 2 wheels;Wheelchair - manual;Hand held shower head      Prior Function Level of Independence: Independent with assistive device(s)  Comments: using RW/Cane for last year. Was driving and doing for herself prior to surgery.      Hand Dominance   Dominant Hand: Right    Extremity/Trunk Assessment   Upper Extremity Assessment Upper Extremity Assessment: Defer to OT evaluation    Lower Extremity Assessment Lower Extremity Assessment: LLE  deficits/detail LLE Deficits / Details: pt with post op pain and weakness. At least 3/5 ankle and 2/5 knee and hip per gross functional assessment.        Communication   Communication: No difficulties  Cognition Arousal/Alertness: Lethargic Behavior During Therapy: WFL for tasks assessed/performed Overall Cognitive Status: Within Functional Limits for tasks assessed                                        General Comments      Exercises     Assessment/Plan    PT Assessment Patient needs continued PT services  PT Problem List Decreased strength;Decreased range of motion;Decreased activity tolerance;Decreased balance;Decreased mobility;Decreased knowledge of use of DME;Pain       PT Treatment Interventions DME instruction;Gait training;Stair training;Functional mobility training;Therapeutic activities;Therapeutic exercise;Balance training;Patient/family education    PT Goals (Current goals can be found in the Care Plan section)  Acute Rehab PT Goals Patient Stated Goal: to go home PT Goal Formulation: With patient Time For Goal Achievement: 10/08/16 Potential to Achieve Goals: Good    Frequency 7X/week   Barriers to discharge        Co-evaluation               End of Session Equipment Utilized During Treatment: Gait belt;Left knee immobilizer;Oxygen Activity Tolerance: Patient limited by pain Patient left: in chair;with call bell/phone within reach Nurse Communication: Mobility status;Patient requests pain meds PT Visit Diagnosis: Difficulty in walking, not elsewhere classified (R26.2);Pain Pain - Right/Left: Left Pain - part of body: Knee    Time: 1127-1204 PT Time Calculation (min) (ACUTE ONLY): 37 min   Charges:   PT Evaluation $PT Eval Moderate Complexity: 1 Procedure PT Treatments $Therapeutic Activity: 8-22 mins   PT G Codes:        Scheryl Marten PT, DPT  [phone]  Shanon Rosser 10/01/2016, 1:46 PM

## 2016-10-01 NOTE — Progress Notes (Signed)
     Subjective: 1 Day Post-Op Procedure(s) (LRB): LEFT TOTAL KNEE ARTHROPLASTY (Left) Patient reports pain as 6 on 0-10 scale.   Denies CP or SOB.  Voiding without difficulty. Positive flatus. Objective: Vital signs in last 24 hours: Temp:  [97.2 F (36.2 C)-99 F (37.2 C)] 99 F (37.2 C) (04/21 0647) Pulse Rate:  [62-89] 62 (04/21 0647) Resp:  [12-23] 16 (04/20 1830) BP: (84-125)/(43-67) 103/57 (04/21 0647) SpO2:  [90 %-99 %] 91 % (04/21 0647) Weight:  [81.6 kg (180 lb)-82.6 kg (182 lb)] 81.6 kg (180 lb) (04/20 1103)  Intake/Output from previous day: 04/20 0701 - 04/21 0700 In: 1120 [P.O.:120; I.V.:1000] Out: 1000 [Urine:825; Blood:175] Intake/Output this shift: No intake/output data recorded.  Labs:  Recent Labs  10/01/16 0548  HGB 11.1*    Recent Labs  10/01/16 0548  WBC 10.4  RBC 3.63*  HCT 35.0*  PLT 111*    Recent Labs  10/01/16 0548  NA 137  K 4.1  CL 108  CO2 21*  BUN 12  CREATININE 1.31*  GLUCOSE 116*  CALCIUM 8.4*   No results for input(s): LABPT, INR in the last 72 hours.  Physical Exam: ABD soft Neurovascular intact Intact pulses distally Incision: dressing C/D/I Compartment soft  Assessment/Plan: 1 Day Post-Op Procedure(s) (LRB): LEFT TOTAL KNEE ARTHROPLASTY (Left) Advance diet Up with therapy D/C IV fluids  Dressing change in AM  Ziggy Chanthavong D for Dr. Melina Schools [facility] [phone] 10/01/2016, 8:32 AM

## 2016-10-01 NOTE — Evaluation (Addendum)
Occupational Therapy Evaluation Patient Details Name: [name] MRN: [id number] DOB: 25-Feb-1951 Today's Date: 10/01/2016    History of Present Illness Pt is a 66 y.o. female admitted on 09/30/16 for L TKA. 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    Clinical Impression   Pt s/p above. Pt independent with ADLs, PTA. Feel pt will benefit from acute OT to increase independence prior to d/c. Recommending HHOT at this time.    Follow Up Recommendations  Home health OT;Supervision/Assistance - 24 hour    Equipment Recommendations  Other (comment) (AE)    Recommendations for Other Services       Precautions / Restrictions Precautions Precautions: Knee Precaution Booklet Issued: No Precaution Comments: educated on knee precautions. Required Braces or Orthoses: Knee Immobilizer - Left Knee Immobilizer - Left: On at all times Restrictions Weight Bearing Restrictions: Yes LLE Weight Bearing: Weight bearing as tolerated      Mobility Bed Mobility Overal bed mobility: Needs Assistance Bed Mobility: Supine to Sit;Sit to Supine     Supine to sit: Mod assist Sit to supine: Mod assist   General bed mobility comments: assist with trunk and LE to come to EOB. Assist to return to bed.         General transfer comment: not assessed-pt lethargic    Balance Able to maintain sitting balance EOB, once positioned well.                         ADL either performed or assessed with clinical judgement   ADL Overall ADL's : Needs assistance/impaired     Grooming: Sitting;Min guard (sitting EOB)               Lower Body Dressing: Bed level;Sitting/lateral leans;Total assistance                 General ADL Comments: Pt very lethargic in session. Sat EOB and unable to fully reach Rt foot. Educated on LB dressing technique. Talked about AE.     Vision         Perception     Praxis      Pertinent  Vitals/Pain Pain Assessment: 0-10 Pain Score: 7  Pain Location: LLE Pain Descriptors / Indicators: Constant Pain Intervention(s): Monitored during session;Repositioned     Hand Dominance Right   Extremity/Trunk Assessment Upper Extremity Assessment Upper Extremity Assessment: Overall WFL for tasks assessed   Lower Extremity Assessment Lower Extremity Assessment: Defer to PT evaluation LLE Deficits / Details: pt with post op pain and weakness. At least 3/5 ankle and 2/5 knee and hip per gross functional assessment.        Communication Communication Communication: No difficulties   Cognition Arousal/Alertness: Lethargic Behavior During Therapy: WFL for tasks assessed/performed Overall Cognitive Status: No family/caregiver present to determine baseline cognitive functioning (slow processing)                                     General Comments       Exercises     Shoulder Instructions      Home Living Family/patient expects to be discharged to:: Private residence Living Arrangements: Spouse/significant other;Children;Other (Comment) (daughter, granddaughter, fiance, great granddaughter) Available Help at Discharge: Family;Available 24 hours/day Type of Home: House Home Access: Stairs to enter [employer] of Steps: 2 Entrance Stairs-Rails: None Home Layout: One level  Bathroom Shower/Tub: Advertising copywriter: Yes   Home Equipment: Bedside commode;Walker - 2 wheels;Wheelchair - manual;Hand held shower head;Adaptive equipment; reacher Adaptive Equipment: Reacher        Prior Functioning/Environment Level of Independence: Independent with assistive device(s)        Comments: using RW/Cane for last year. Was driving and doing for herself prior to surgery.         OT Problem List: Decreased strength;Decreased range of motion;Decreased activity tolerance;Decreased cognition;Decreased  knowledge of use of DME or AE;Decreased knowledge of precautions;Pain      OT Treatment/Interventions: Self-care/ADL training;DME and/or AE instruction;Balance training;Patient/family education;Therapeutic activities;Cognitive remediation/compensation    OT Goals(Current goals can be found in the care plan section) Acute Rehab OT Goals Patient Stated Goal: not stated OT Goal Formulation: With patient Time For Goal Achievement: 10/08/16 Potential to Achieve Goals: Good ADL Goals Pt Will Perform Lower Body Dressing: with min assist;with adaptive equipment;sit to/from stand Pt Will Transfer to Toilet: with min guard assist;ambulating;bedside commode Pt Will Perform Toileting - Clothing Manipulation and hygiene: with min guard assist;sit to/from stand Additional ADL Goal #1: Pt will perform bed mobility at Supervision level.  OT Frequency: Min 2X/week   Barriers to D/C:            Co-evaluation              End of Session Equipment Utilized During Treatment: Left knee immobilizer (placed on pt at end of session and left on LLE.) CPM Left Knee CPM Left Knee: Off Nurse Communication: Other (comment) (asked tech to get pt back in CPM)  Activity Tolerance: Patient limited by lethargy Patient left: in bed;with call bell/phone within reach;with bed alarm set  OT Visit Diagnosis: Pain Pain - Right/Left: Left Pain - part of body: Leg                Time: 1400-1413 OT Time Calculation (min): 13 min Charges:  OT General Charges $OT Visit: 1 Procedure OT Evaluation $OT Eval Moderate Complexity: 1 Procedure G-Codes:     Benito Mccreedy OTR/L 10/01/2016, 2:35 PM

## 2016-10-01 NOTE — Progress Notes (Signed)
Physical Therapy Treatment Patient Details Name: [name] MRN: [id number] DOB: Aug 12, 1950 Today's Date: 10/01/2016    History of Present Illness Pt is a 66 y.o. female admitted on 09/30/16 for L TKA. 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     PT Comments    Pt continues to be progressing slowly with therapy. Pt attempted to ambulate to bathroom, but was unable to make it but 1 step away from the bed before she had to sit down and get back to bed. Pt has increased lethargy limiting progression today and is not appropriate to return home with current status. Updated DC plan to SNF due to current functional status. If pt improves, may be able to progress to home.     Follow Up Recommendations  SNF;Supervision/Assistance - 24 hour     Equipment Recommendations  Rolling walker with 5" wheels;3in1 (PT)    Recommendations for Other Services       Precautions / Restrictions Precautions Precautions: Knee Precaution Booklet Issued: No Precaution Comments: educated on knee precautions. Required Braces or Orthoses: Knee Immobilizer - Left Knee Immobilizer - Left: On at all times Restrictions Weight Bearing Restrictions: Yes LLE Weight Bearing: Weight bearing as tolerated    Mobility  Bed Mobility Overal bed mobility: Needs Assistance Bed Mobility: Supine to Sit;Sit to Supine     Supine to sit: Mod assist Sit to supine: Mod assist   General bed mobility comments: assist with trunk and LE to come to EOB. Assist to return to bed.   Transfers Overall transfer level: Needs assistance Equipment used: Rolling walker (2 wheeled) Transfers: Sit to/from Stand Sit to Stand: Mod assist;From elevated surface Stand pivot transfers: Min assist       General transfer comment: Mod A to stand after 2 attempts. Able to take 1 step away from bed and then returned back to bed requiring Min-Mod A to maintain balance  throughout  Ambulation/Gait             General Gait Details: Did not attempt due to lethargy   Stairs            Wheelchair Mobility    Modified Rankin (Stroke Patients Only)       Balance Overall balance assessment: Needs assistance Sitting-balance support: Single extremity supported;Feet supported Sitting balance-Leahy Scale: Fair     Standing balance support: Bilateral upper extremity supported;During functional activity Standing balance-Leahy Scale: Poor Standing balance comment: heavy reliance on Rw for stability in standing                            Cognition Arousal/Alertness: Lethargic Behavior During Therapy: WFL for tasks assessed/performed;Flat affect Overall Cognitive Status: No family/caregiver present to determine baseline cognitive functioning                                 General Comments: increased lethargy this session as compared to previous      Exercises      General Comments        Pertinent Vitals/Pain Pain Assessment: Faces Pain Score: 7  Faces Pain Scale: Hurts little more Pain Location: LLE Pain Descriptors / Indicators: Grimacing;Guarding Pain Intervention(s): Monitored during session;Premedicated before session    Home Living Family/patient expects to be discharged to:: Private residence Living Arrangements: Spouse/significant other;Children;Other (Comment) (daughter, granddaughter, fiance, great granddaughter) Available Help at  Discharge: Family;Available 24 hours/day Type of Home: House Home Access: Stairs to enter Entrance Stairs-Rails: None Home Layout: One level Home Equipment: Bedside commode;Walker - 2 wheels;Wheelchair - manual;Hand held shower head;Adaptive equipment      Prior Function Level of Independence: Independent with assistive device(s)      Comments: using RW/Cane for last year. Was driving and doing for herself prior to surgery.    PT Goals (current goals can now  be found in the care plan section) Acute Rehab PT Goals Patient Stated Goal: not stated PT Goal Formulation: With patient Time For Goal Achievement: 10/08/16 Potential to Achieve Goals: Good Progress towards PT goals: Not progressing toward goals - comment (increased lethargy limiting progression this session)    Frequency    7X/week      PT Plan Discharge plan needs to be updated    Co-evaluation             End of Session Equipment Utilized During Treatment: Gait belt;Left knee immobilizer;Oxygen Activity Tolerance: Patient limited by lethargy Patient left: in bed;with call bell/phone within reach;with SCD's reapplied;with bed alarm set Nurse Communication: Mobility status PT Visit Diagnosis: Difficulty in walking, not elsewhere classified (R26.2);Pain Pain - Right/Left: Left Pain - part of body: Knee     Time: 4196-2229 PT Time Calculation (min) (ACUTE ONLY): 33 min  Charges:  $Therapeutic Activity: 23-37 mins                    G Codes:       Scheryl Marten PT, DPT  [phone]    Jacqulyn Liner Sloan Leiter 10/01/2016, 4:45 PM

## 2016-10-02 LAB — CBC
HCT: 35.8 % — ABNORMAL LOW (ref 36.0–46.0)
Hemoglobin: 11.2 g/dL — ABNORMAL LOW (ref 12.0–15.0)
MCH: 30.4 pg (ref 26.0–34.0)
MCHC: 31.3 g/dL (ref 30.0–36.0)
MCV: 97.3 fL (ref 78.0–100.0)
Platelets: 104 10*3/uL — ABNORMAL LOW (ref 150–400)
RBC: 3.68 MIL/uL — AB (ref 3.87–5.11)
RDW: 15 % (ref 11.5–15.5)
WBC: 9.4 10*3/uL (ref 4.0–10.5)

## 2016-10-02 LAB — GLUCOSE, CAPILLARY
Glucose-Capillary: 88 mg/dL (ref 65–99)
Glucose-Capillary: 185 mg/dL — ABNORMAL HIGH (ref 65–99)
Glucose-Capillary: 74 mg/dL (ref 65–99)
Glucose-Capillary: 119 mg/dL — ABNORMAL HIGH (ref 65–99)

## 2016-10-02 MED ORDER — BETHANECHOL CHLORIDE 10 MG PO TABS
10.0000 mg | ORAL_TABLET | Freq: Three times a day (TID) | ORAL | Status: DC | PRN
  Administered 2016-10-02 (×2): 10 mg via ORAL
  Filled 2016-10-02 (×4): qty 1

## 2016-10-02 MED ORDER — TRAMADOL HCL 50 MG PO TABS: 50.0000 mg | ORAL_TABLET | Freq: Four times a day (QID) | ORAL | Status: DC

## 2016-10-02 MED ORDER — TRAMADOL HCL 50 MG PO TABS
50.0000 mg | ORAL_TABLET | Freq: Four times a day (QID) | ORAL | Status: DC | PRN
  Administered 2016-10-03 – 2016-10-06 (×6): 50 mg via ORAL
  Filled 2016-10-02 (×7): qty 1

## 2016-10-02 NOTE — Progress Notes (Signed)
Physical Therapy Treatment Patient Details Name: [name] MRN: [id number] DOB: 03/18/1951 Today's Date: 10/02/2016    History of Present Illness Pt is a 66 y.o. female admitted on 09/30/16 for L TKA. 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     PT Comments    Pt presents with increased confusion and delirium this session. Pt is talking about people who are not in the room, is having difficulty with word finding and difficulty staying on task during session. RN notified and assisted PT in getting pt back to bed with bed alarm set. Pt continues to be a better candidate for SNF placement due to current cognitive deficits and mobility difficulty.    Follow Up Recommendations  SNF;Supervision/Assistance - 24 hour     Equipment Recommendations  Rolling walker with 5" wheels;3in1 (PT)    Recommendations for Other Services       Precautions / Restrictions Precautions Precautions: Knee Precaution Booklet Issued: Yes (comment) Precaution Comments: educated on knee precautions. Required Braces or Orthoses: Knee Immobilizer - Left Knee Immobilizer - Left: On at all times Restrictions Weight Bearing Restrictions: Yes LLE Weight Bearing: Weight bearing as tolerated    Mobility  Bed Mobility Overal bed mobility: Needs Assistance Bed Mobility: Sit to Supine     Supine to sit: Mod assist Sit to supine: Max assist;+2 for physical assistance   General bed mobility comments: Max A for trunk and to bring Le's back into bed.   Transfers Overall transfer level: Needs assistance Equipment used: 2 person hand held assist Transfers: Stand Pivot Transfers;Sit to/from Stand Sit to Stand: Max assist;+2 physical assistance Stand pivot transfers: Max assist;+2 physical assistance       General transfer comment: Max A to stand and to pivot to bed.   Ambulation/Gait             General Gait Details: attempted to perform gait to  bathroom, pt unable to take more than 2 steps toward door away from bed   Stairs            Wheelchair Mobility    Modified Rankin (Stroke Patients Only)       Balance Overall balance assessment: Needs assistance Sitting-balance support: No upper extremity supported;Feet supported Sitting balance-Leahy Scale: Fair     Standing balance support: Bilateral upper extremity supported;During functional activity Standing balance-Leahy Scale: Poor Standing balance comment: heavy reliance on Rw for stability in standing                            Cognition Arousal/Alertness: Lethargic;Suspect due to medications Behavior During Therapy: Flat affect Overall Cognitive Status: No family/caregiver present to determine baseline cognitive functioning                                 General Comments: increased lethargy, dilirium and confusion this session. Pt is not oriented to time. RN notifed and assisted.       Exercises Total Joint Exercises Ankle Circles/Pumps: AROM;Both;20 reps;Supine Quad Sets: AAROM;10 reps;Supine;Left Heel Slides: AROM;Left;10 reps;Supine    General Comments        Pertinent Vitals/Pain Pain Assessment: Faces Pain Score: 5  Faces Pain Scale: Hurts whole lot Pain Location: Left knee with mobility Pain Descriptors / Indicators: Grimacing;Guarding Pain Intervention(s): Monitored during session;Repositioned    Home Living  Prior Function            PT Goals (current goals can now be found in the care plan section) Acute Rehab PT Goals Patient Stated Goal: to go home Progress towards PT goals: Not progressing toward goals - comment (decreased transfer and bed mobility status due to cognitive decline from AM session.)    Frequency    7X/week      PT Plan Current plan remains appropriate    Co-evaluation             End of Session Equipment Utilized During Treatment: Gait  belt;Left knee immobilizer Activity Tolerance: Patient limited by lethargy Patient left: in bed;with call bell/phone within reach;with bed alarm set;with SCD's reapplied Nurse Communication: Mobility status PT Visit Diagnosis: Difficulty in walking, not elsewhere classified (R26.2);Pain Pain - Right/Left: Left Pain - part of body: Knee     Time: 7096-2836 PT Time Calculation (min) (ACUTE ONLY): 28 min  Charges:  $Therapeutic Exercise: 8-22 mins $Therapeutic Activity: 8-22 mins                    G Codes:       Scheryl Marten PT, DPT  [phone]    Shanon Rosser 10/02/2016, 4:06 PM

## 2016-10-02 NOTE — Progress Notes (Signed)
Orthopedic Tech Progress Note Patient Details:  [name] August 03, 1950 [id number]  Patient ID: [name], female   DOB: 1950-09-27, 66 y.o.   MRN: [id number] Pt will call when ready to go in cpm  Karolee Stamps 10/02/2016, 6:33 AM

## 2016-10-02 NOTE — Progress Notes (Signed)
Physical Therapy Treatment Patient Details Name: [name] MRN: [id number] DOB: [birth year] Today's Date: 10/02/2016    History of Present Illness Pt is a 66 y.o. female admitted on 09/30/16 for L TKA. 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     PT Comments    Pt presents with improved ability to perform transfers and bed mobs this session. Pt continues to require increased assistance to safely perform bed mobs and transfers including stand pivot transfers to [facility]. Pt is more alert this session and able to better participate with therapy with O2 sats remaining about 94% on RA following mobility, but is making slow progress limiting her ability to return home at this time. Pt continues to require SNF at discharge in order to maximize her outcomes.     Follow Up Recommendations  SNF;Supervision/Assistance - 24 hour     Equipment Recommendations  Rolling walker with 5" wheels;3in1 (PT)    Recommendations for Other Services       Precautions / Restrictions Precautions Precautions: Knee Precaution Booklet Issued: Yes (comment) Precaution Comments: educated on knee precautions. Required Braces or Orthoses: Knee Immobilizer - Left Knee Immobilizer - Left: On at all times Restrictions Weight Bearing Restrictions: Yes LLE Weight Bearing: Weight bearing as tolerated    Mobility  Bed Mobility Overal bed mobility: Needs Assistance Bed Mobility: Supine to Sit     Supine to sit: Mod assist     General bed mobility comments: Mod A to bring LLE OOB and to assist trunk into sitting  Transfers Overall transfer level: Needs assistance Equipment used: Rolling walker (2 wheeled) Transfers: Sit to/from [facility] Sit to Stand: Mod assist;From elevated surface Stand pivot transfers: Min assist       General transfer comment: Mod A to stand from bed after 2 attempts. Pt unable to pivot to commode and positioned  commode behind her to sit. Pt is able to transfer from [facility] to recliner with Mod A   Ambulation/Gait             General Gait Details: attempted to perform gait to bathroom, pt unable to take more than 2 steps toward door away from bed   Stairs            Wheelchair Mobility    Modified Rankin (Stroke Patients Only)       Balance Overall balance assessment: Needs assistance Sitting-balance support: No upper extremity supported;Feet supported Sitting balance-Leahy Scale: Fair     Standing balance support: Bilateral upper extremity supported;During functional activity Standing balance-Leahy Scale: Poor Standing balance comment: heavy reliance on Rw for stability in standing                            Cognition Arousal/Alertness: Awake/alert Behavior During Therapy: WFL for tasks assessed/performed;Flat affect Overall Cognitive Status: Within Functional Limits for tasks assessed                                        Exercises      General Comments        Pertinent Vitals/Pain Pain Assessment: 0-10 Pain Score: 5  Pain Location: Left knee with mobility Pain Descriptors / Indicators: Grimacing;Guarding Pain Intervention(s): Monitored during session;Premedicated before session;Ice applied    Home Living  Prior Function            PT Goals (current goals can now be found in the care plan section) Acute Rehab PT Goals Patient Stated Goal: to go home Progress towards PT goals: Progressing toward goals    Frequency    7X/week      PT Plan Current plan remains appropriate    Co-evaluation             End of Session Equipment Utilized During Treatment: Gait belt;Left knee immobilizer Activity Tolerance: Patient tolerated treatment well;Patient limited by fatigue Patient left: in chair;with call bell/phone within reach Nurse Communication: Mobility status;Other (comment) (pt  urinated) PT Visit Diagnosis: Difficulty in walking, not elsewhere classified (R26.2);Pain Pain - Right/Left: Left Pain - part of body: Knee     Time: 2103-1281 PT Time Calculation (min) (ACUTE ONLY): 40 min  Charges:  $Therapeutic Activity: 38-52 mins                    G Codes:       Scheryl Marten PT, DPT  [phone]    Shanon Rosser 10/02/2016, 1:07 PM

## 2016-10-02 NOTE — Progress Notes (Signed)
Subjective: 2 Days Post-Op Procedure(s) (LRB): LEFT TOTAL KNEE ARTHROPLASTY (Left) Patient reports pain as 3 on 0-10 scale.  Dressing changed and wound looks fine. Slight urinary retention.has had In and Out cath.  Objective: Vital signs in last 24 hours: Temp:  [98.1 F (36.7 C)-98.9 F (37.2 C)] 98.9 F (37.2 C) (04/22 0433) Pulse Rate:  [74-83] 82 (04/22 0433) Resp:  [16] 16 (04/21 1345) BP: (80-118)/(48-67) 118/67 (04/22 0433) SpO2:  [91 %-96 %] 91 % (04/22 0433)  Intake/Output from previous day: 04/21 0701 - 04/22 0700 In: 790 [P.O.:720; I.V.:70] Out: 1375 [Urine:1375] Intake/Output this shift: No intake/output data recorded.   Recent Labs  10/01/16 0548  HGB 11.1*    Recent Labs  10/01/16 0548  WBC 10.4  RBC 3.63*  HCT 35.0*  PLT 111*    Recent Labs  10/01/16 0548  NA 137  K 4.1  CL 108  CO2 21*  BUN 12  CREATININE 1.31*  GLUCOSE 116*  CALCIUM 8.4*   No results for input(s): LABPT, INR in the last 72 hours.  Dorsiflexion/Plantar flexion intact No cellulitis present Compartment soft  Assessment/Plan: 2 Days Post-Op Procedure(s) (LRB): LEFT TOTAL KNEE ARTHROPLASTY (Left) Up with therapy  Ceclia Koker A 10/02/2016, 7:51 AM

## 2016-10-02 NOTE — Progress Notes (Signed)
Patient is having some delirium. She is oriented to self but she thinks the year is 2033 and that president Tawni Pummel is the president. She knows that she is in [facility]. MD notified. Patient to take tramadol for pain instead of oxycodone.

## 2016-10-03 ENCOUNTER — Encounter (HOSPITAL_COMMUNITY): Payer: Self-pay | Admitting: Orthopedic Surgery

## 2016-10-03 LAB — GLUCOSE, CAPILLARY
GLUCOSE-CAPILLARY: 163 mg/dL — AB (ref 65–99)
Glucose-Capillary: 121 mg/dL — ABNORMAL HIGH (ref 65–99)
Glucose-Capillary: 166 mg/dL — ABNORMAL HIGH (ref 65–99)
Glucose-Capillary: 115 mg/dL — ABNORMAL HIGH (ref 65–99)

## 2016-10-03 LAB — CBC
HEMATOCRIT: 32.1 % — AB (ref 36.0–46.0)
HEMOGLOBIN: 10 g/dL — AB (ref 12.0–15.0)
MCH: 29.9 pg (ref 26.0–34.0)
MCHC: 31.2 g/dL (ref 30.0–36.0)
MCV: 95.8 fL (ref 78.0–100.0)
Platelets: 107 10*3/uL — ABNORMAL LOW (ref 150–400)
RBC: 3.35 MIL/uL — ABNORMAL LOW (ref 3.87–5.11)
RDW: 14.6 % (ref 11.5–15.5)
WBC: 8.9 10*3/uL (ref 4.0–10.5)

## 2016-10-03 MED ORDER — SODIUM CHLORIDE 0.9 % IV BOLUS (SEPSIS)
500.0000 mL | Freq: Once | INTRAVENOUS | Status: AC
  Administered 2016-10-03: 500 mL via INTRAVENOUS

## 2016-10-03 MED ORDER — SODIUM CHLORIDE 0.9 % IV SOLN
INTRAVENOUS | Status: DC
  Administered 2016-10-04 – 2016-10-05 (×4): via INTRAVENOUS
  Administered 2016-10-05: 75 mL/h via INTRAVENOUS

## 2016-10-03 NOTE — Progress Notes (Signed)
Physical Therapy Treatment Patient Details Name: [name] MRN: [id number] DOB: [birth year] Today's Date: 10/03/2016    History of Present Illness Pt is a 66 y.o. female admitted on 09/30/16 for L TKA. 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     PT Comments    Pt is making slow progress towards her goals and still in not appropriate for discharge home. Pt is min A for bed mobility, mod A for transfers and ambulation with RW for 3 feet. Pt requires skilled PT to continue to progress transfer and ambulation training as well as to improve LE ROM and strength to be able to safely navigate her discharge environment.     Follow Up Recommendations  SNF;Supervision/Assistance - 24 hour     Equipment Recommendations  Rolling walker with 5" wheels;3in1 (PT)    Recommendations for Other Services       Precautions / Restrictions Precautions Precautions: Knee Precaution Booklet Issued: Yes (comment) Precaution Comments: educated on knee precautions. Required Braces or Orthoses: Knee Immobilizer - Left Knee Immobilizer - Left: On at all times Restrictions Weight Bearing Restrictions: Yes LLE Weight Bearing: Weight bearing as tolerated    Mobility  Bed Mobility Overal bed mobility: Needs Assistance Bed Mobility: Sit to Supine Rolling: Supervision   Supine to sit: Min guard Sit to supine: Min assist   General bed mobility comments: pt able to manage L LE to ground with minA  Transfers Overall transfer level: Needs assistance Equipment used: Rolling walker (2 wheeled) Transfers: Sit to/from Stand Sit to Stand: Mod assist Stand pivot transfers: Mod assist       General transfer comment: mod A to come to upright and minA for steadying once upright. vc for hand placement and safety  Ambulation/Gait Ambulation/Gait assistance: Mod assist Ambulation Distance (Feet): 2 Feet Assistive device: Rolling walker (2 wheeled) Gait  Pattern/deviations: Step-to pattern;Decreased step length - right;Decreased stance time - left;Decreased weight shift to left;Shuffle;Trunk flexed;Antalgic Gait velocity: slowed Gait velocity interpretation: Below normal speed for age/gender General Gait Details: pt required modA for RW management and stability with gait, no knee buckling vc for sequencing and RW management         Balance Overall balance assessment: Needs assistance Sitting-balance support: Feet supported;Bilateral upper extremity supported;Single extremity supported Sitting balance-Leahy Scale: Fair Sitting balance - Comments: able to attain balance with only one UE for <30 sec Postural control: Posterior lean Standing balance support: Bilateral upper extremity supported Standing balance-Leahy Scale: Poor Standing balance comment: requires UE support on RW                            Cognition Arousal/Alertness: Lethargic;Suspect due to medications Behavior During Therapy: Flat affect Overall Cognitive Status: No family/caregiver present to determine baseline cognitive functioning                                 General Comments: Pt requests to not use glasses to aid with diplopia. Pt reports she hates using them.       Exercises Total Joint Exercises Ankle Circles/Pumps: AROM;Both;20 reps;Supine Quad Sets: AAROM;10 reps;Supine;Left Short Arc Quad: AROM;Left;10 reps;Supine Heel Slides: AROM;Left;10 reps;Supine Hip ABduction/ADduction: AROM;10 reps;Left;Supine Straight Leg Raises: AAROM;Left;10 reps;Supine Goniometric ROM: 12 to 90 degrees    General Comments General comments (skin integrity, edema, etc.): Pt has diplopia that is corrected by prisim glasses.  Pt does not have glasses with latest prescription with her at the hospital so she still has some amount of double vision reducing her safety and confidence in ambualtion.       Pertinent Vitals/Pain Pain Assessment:  Faces Faces Pain Scale: Hurts little more Pain Location: Left knee with mobility Pain Descriptors / Indicators: Grimacing;Guarding Pain Intervention(s): Monitored during session;Limited activity within patient's tolerance  VSS        PT Goals (current goals can now be found in the care plan section) Acute Rehab PT Goals Patient Stated Goal: to go home PT Goal Formulation: With patient Time For Goal Achievement: 10/08/16 Potential to Achieve Goals: Fair Progress towards PT goals: Progressing toward goals    Frequency    7X/week      PT Plan Current plan remains appropriate    Co-evaluation PT/OT/SLP Co-Evaluation/Treatment: Yes Reason for Co-Treatment: For patient/therapist safety PT goals addressed during session: Mobility/safety with mobility;Proper use of DME;Strengthening/ROM OT goals addressed during session: ADL's and self-care;Proper use of Adaptive equipment and DME     End of Session Equipment Utilized During Treatment: Gait belt;Left knee immobilizer Activity Tolerance: Patient limited by lethargy;Other (comment) (limited by diplopia ) Patient left: in bed;with call bell/phone within reach;with bed alarm set;with SCD's reapplied Nurse Communication: Mobility status PT Visit Diagnosis: Difficulty in walking, not elsewhere classified (R26.2);Pain Pain - Right/Left: Left Pain - part of body: Knee     Time: 5009-3818 PT Time Calculation (min) (ACUTE ONLY): 21 min  Charges:  $Gait Training: 8-22 mins $Therapeutic Exercise: 8-22 mins $Therapeutic Activity: 8-22 mins                    G Codes:       [name] B. Migdalia Dk PT, DPT Acute Rehabilitation  [phone] Pager [phone]     [place] 10/03/2016, 5:28 PM

## 2016-10-03 NOTE — Progress Notes (Signed)
Physical Therapy Treatment Patient Details Name: [name] MRN: [id number] DOB: 20-Apr-1951 Today's Date: 10/03/2016    History of Present Illness Pt is a 66 y.o. female admitted on 09/30/16 for L TKA. 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     PT Comments    Pt is currently modA for bed mobility, modAx2 for sit<>stand transfer to RW and modAx2 for ambulation of 15 feet with RW. Pt is making slow progress towards her goals but is currently not suitable for discharge home. Physician requested via phone conversation with PT that Rehab therapy continue to work with pt this evening and tomorrow to progress towards discharge home with home health. PT will follow up with pt this evening as time allows. Pt requires skilled PT to progress bed mobility, transfers and ambulation as well as to improve LE strength and L knee ROM to be able to safely navigate her discharge environment.      Follow Up Recommendations  SNF;Supervision/Assistance - 24 hour     Equipment Recommendations  Rolling walker with 5" wheels;3in1 (PT)    Recommendations for Other Services       Precautions / Restrictions Precautions Precautions: Knee Precaution Booklet Issued: Yes (comment) Precaution Comments: educated on knee precautions. Required Braces or Orthoses: Knee Immobilizer - Left Knee Immobilizer - Left: On at all times Restrictions Weight Bearing Restrictions: Yes LLE Weight Bearing: Weight bearing as tolerated    Mobility  Bed Mobility Overal bed mobility: Needs Assistance Bed Mobility: Rolling;Supine to Sit;Sit to Supine Rolling: Supervision   Supine to sit: Min guard Sit to supine: Min guard   General bed mobility comments: pt provided sheet as leg lifter and able to complete transfer with incr time and cues to direction for bed mobility.   Transfers Overall transfer level: Needs assistance Equipment used: Rolling walker (2  wheeled) Transfers: Sit to/from Stand Sit to Stand: Mod assist Stand pivot transfers: Mod assist       General transfer comment: pt sit<>Stand with MOD (A). pt transfering with total +2 min (A). pt needed cues for RW placement and safety  Ambulation/Gait Ambulation/Gait assistance: Mod assist;+2 physical assistance Ambulation Distance (Feet): 15 Feet Assistive device: Rolling walker (2 wheeled) Gait Pattern/deviations: Step-to pattern;Decreased step length - right;Decreased stance time - left;Decreased weight shift to left;Shuffle;Trunk flexed;Antalgic Gait velocity: slowed Gait velocity interpretation: Below normal speed for age/gender General Gait Details: pt required modAx2 for RW management and stability with gait, no knee buckling vc for staying in walker, increasing step length, upright posture and looking out.      Balance Overall balance assessment: Needs assistance Sitting-balance support: Feet supported;Bilateral upper extremity supported;Single extremity supported Sitting balance-Leahy Scale: Fair Sitting balance - Comments: able to attain balance with only one UE for <30 sec Postural control: Posterior lean Standing balance support: Bilateral upper extremity supported Standing balance-Leahy Scale: Poor Standing balance comment: heavily reliance on RW but able to release with one hand to descript diplopia                            Cognition Arousal/Alertness: Awake/alert Behavior During Therapy: Flat affect Overall Cognitive Status: No family/caregiver present to determine baseline cognitive functioning                                 General Comments: pt with eyes closed on arrival.  Upon further questioning learned that she has fallen and struck her head so hard as to need to see a PT and that she has a history of diplopia that was worsen after the fall. pt did not express to staff that light in the room made it difficult to visually attend       Exercises Total Joint Exercises Ankle Circles/Pumps: AROM;Both;20 reps;Supine Quad Sets: AAROM;10 reps;Supine;Left Short Arc Quad: AROM;Left;10 reps;Supine Heel Slides: AROM;Left;10 reps;Supine Hip ABduction/ADduction: AROM;10 reps;Left;Supine Straight Leg Raises: AAROM;Left;10 reps;Supine Goniometric ROM: 12 to 90 degrees    General Comments General comments (skin integrity, edema, etc.): Pt has diplopia that is corrected by prisim glasses. Pt does not have glasses with latest prescription with her at the hospital so she still has some amount of double vision reducing her safety and confidence in ambualtion.       Pertinent Vitals/Pain Pain Assessment: Faces Faces Pain Scale: Hurts even more Pain Location: Left knee with mobility Pain Descriptors / Indicators: Grimacing Pain Intervention(s): Monitored during session;Premedicated before session;Repositioned  VSS           PT Goals (current goals can now be found in the care plan section) Acute Rehab PT Goals Patient Stated Goal: to get therapy before home. i have a two year old there PT Goal Formulation: With patient Time For Goal Achievement: 10/08/16 Potential to Achieve Goals: Fair Progress towards PT goals: Progressing toward goals    Frequency    7X/week      PT Plan Current plan remains appropriate    Co-evaluation PT/OT/SLP Co-Evaluation/Treatment: Yes Reason for Co-Treatment: For patient/therapist safety PT goals addressed during session: Mobility/safety with mobility;Proper use of DME;Strengthening/ROM OT goals addressed during session: ADL's and self-care;Proper use of Adaptive equipment and DME     End of Session Equipment Utilized During Treatment: Gait belt;Left knee immobilizer Activity Tolerance: Patient limited by lethargy;Other (comment) (limited by diplopia ) Patient left: in bed;with call bell/phone within reach;with bed alarm set;with SCD's reapplied Nurse Communication: Mobility  status PT Visit Diagnosis: Difficulty in walking, not elsewhere classified (R26.2);Pain Pain - Right/Left: Left Pain - part of body: Knee     Time: 5465-0354 PT Time Calculation (min) (ACUTE ONLY): 61 min  Charges:  $Gait Training: 8-22 mins $Therapeutic Exercise: 8-22 mins                    G Codes:       Yaritzi Craun B. Migdalia Dk PT, DPT Acute Rehabilitation  [phone] Pager [phone]     [place] 10/03/2016, 3:03 PM

## 2016-10-03 NOTE — Progress Notes (Signed)
Occupational Therapy Treatment Patient Details Name: [name] MRN: [id number] DOB: 1950-08-19 Today's Date: 10/03/2016    History of present illness Pt is a 66 y.o. female admitted on 09/30/16 for L TKA. 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    OT comments  Pt verbalized that she has diplopia at baseline and dim light is better for her to visually attend to task. Pt closing eyes constantly and it was due to visual deficits not fatigue. Pt able to transfer >5 ft this session with mod cues for safety and RW placement. Pt placed in CPM and noted to have poor alignment at the knee so ORtho tech called by PT to notify of need for small machine. Pt currently agrees to SNF due to 3 dogs, 2 cats, 3 lizards, 1 daughter, 2 grandchildren (64 yo female/ 64 yo female) and one great grandchild (64 yo female) in the home.    Follow Up Recommendations  SNF;Supervision/Assistance - 24 hour    Equipment Recommendations  3 in 1 bedside commode;Hospital bed    Recommendations for Other Services      Precautions / Restrictions Precautions Precautions: Knee Precaution Booklet Issued: Yes (comment) Precaution Comments: educated on knee precautions. Required Braces or Orthoses: Knee Immobilizer - Left Knee Immobilizer - Left: On at all times Restrictions Weight Bearing Restrictions: Yes LLE Weight Bearing: Weight bearing as tolerated       Mobility Bed Mobility Overal bed mobility: Needs Assistance Bed Mobility: Rolling;Supine to Sit;Sit to Supine Rolling: Supervision   Supine to sit: Min guard Sit to supine: Min guard   General bed mobility comments: pt provided sheet as leg lifter and able to complete transfer with incr time and cues to direction for bed mobility.   Transfers Overall transfer level: Needs assistance Equipment used: Rolling walker (2 wheeled) Transfers: Sit to/from Stand Sit to Stand: Mod assist Stand pivot transfers:  Max assist;+2 physical assistance       General transfer comment: pt sit<>Stand with MOD (A). pt transfering with total +2 min (A). pt needed cues for RW placement and safety    Balance           Standing balance support: Bilateral upper extremity supported Standing balance-Leahy Scale: Poor Standing balance comment: heavily reliance on RW but able to release with one hand to descript diplopia                           ADL either performed or assessed with clinical judgement   ADL Overall ADL's : Needs assistance/impaired                     Lower Body Dressing: Moderate assistance Lower Body Dressing Details (indicate cue type and reason): educated on AE and needed cues for reacher use.  Toilet Transfer: Minimal assistance;Ambulation;RW;BSC           Functional mobility during ADLs: Minimal assistance;Rolling walker General ADL Comments: pt requires glasses dim light KI and mod cues for safety. pt with poor hand placement on RW. pt needed cues to place hands on gribs     Vision   Additional Comments: pt has diplopia one on top of the other. pt reports awaiting new glasses with 6 prisms in them   Perception     Praxis      Cognition Arousal/Alertness: Awake/alert Behavior During Therapy: Flat affect Overall Cognitive Status: No family/caregiver present to determine  baseline cognitive functioning                                 General Comments: pt with eyes closed on arrival. Upon further questioning learned that she has fallen and struck her head so hard as to need to see a PT and that she has a history of diplopia that was worsen after the fall. pt did not express to staff that light in the room made it difficult to visually attend        Exercises Total Joint Exercises Ankle Circles/Pumps: AROM;Both;20 reps;Supine Quad Sets: AAROM;10 reps;Supine;Left Heel Slides: AROM;Left;10 reps;Supine   Shoulder Instructions        General Comments      Pertinent Vitals/ Pain       Pain Assessment: Faces Faces Pain Scale: Hurts even more Pain Location: Left knee with mobility Pain Descriptors / Indicators: Grimacing Pain Intervention(s): Monitored during session;Premedicated before session;Repositioned  Home Living                                          Prior Functioning/Environment              Frequency  Min 2X/week        Progress Toward Goals  OT Goals(current goals can now be found in the care plan section)  Progress towards OT goals: Progressing toward goals  Acute Rehab OT Goals Patient Stated Goal: to get therapy before home. i have a two year old there OT Goal Formulation: With patient Time For Goal Achievement: 10/08/16 Potential to Achieve Goals: Good ADL Goals Pt Will Perform Lower Body Dressing: with min assist;with adaptive equipment;sit to/from stand Pt Will Transfer to Toilet: with min guard assist;ambulating;bedside commode Pt Will Perform Toileting - Clothing Manipulation and hygiene: with min guard assist;sit to/from stand Additional ADL Goal #1: Pt will perform bed mobility at Supervision level.  Plan Discharge plan needs to be updated    Co-evaluation                 End of Session Equipment Utilized During Treatment: Gait belt;Rolling walker;Left knee immobilizer  OT Visit Diagnosis: Pain Pain - Right/Left: Left Pain - part of body: Leg   Activity Tolerance Patient tolerated treatment well   Patient Left in bed;with call bell/phone within reach   Nurse Communication Mobility status;Precautions        Time: 8841-6606 OT Time Calculation (min): 18 min  Charges: OT General Charges $OT Visit: 1 Procedure OT Treatments $Self Care/Home Management : 8-22 mins   Jeri Modena   OTR/L Pager: [phone] Office: [phone] .    Parke Poisson B 10/03/2016, 2:56 PM

## 2016-10-03 NOTE — Progress Notes (Signed)
Pt OOB sitting up in chair; pt sat up for dinner as well. Call light within reach and will report off to oncoming RN. Delia Heady RN

## 2016-10-03 NOTE — Progress Notes (Signed)
Talked to Dr. Ledell Noss regarding low BP- Normal saline 597ml bolus followed by continous IVF at 75 ml/hr ordered.

## 2016-10-03 NOTE — Progress Notes (Signed)
Orthopedics Progress Note  Subjective: Patient complaining of pain in the knee making it difficult to mobilize  Objective:  Vitals:   10/02/16 2110 10/03/16 0328  BP: 114/63 (!) 110/52  Pulse: 92 71  Resp: 18 18  Temp: 99.6 F (37.6 C) 99 F (37.2 C)    General: Awake and alert  Musculoskeletal: left knee incision intact with mild erythema and swelling, no cords and Neg [place]ans Neurovascularly intact  Lab Results  Component Value Date   WBC 8.9 10/03/2016   HGB 10.0 (L) 10/03/2016   HCT 32.1 (L) 10/03/2016   MCV 95.8 10/03/2016   PLT 107 (L) 10/03/2016       Component Value Date/Time   NA 137 10/01/2016 0548   NA 139 02/25/2015 1614   K 4.1 10/01/2016 0548   CL 108 10/01/2016 0548   CO2 21 (L) 10/01/2016 0548   GLUCOSE 116 (H) 10/01/2016 0548   BUN 12 10/01/2016 0548   BUN 18 02/25/2015 1614   CREATININE 1.31 (H) 10/01/2016 0548   CREATININE 0.76 09/22/2014 1036   CALCIUM 8.4 (L) 10/01/2016 0548   CALCIUM 10.3 08/20/2012 1402   GFRNONAA 42 (L) 10/01/2016 0548   GFRNONAA 85 09/22/2014 1036   GFRAA 49 (L) 10/01/2016 0548   GFRAA >89 09/22/2014 1036    Lab Results  Component Value Date   INR 0.95 09/28/2013   INR 0.92 03/09/2013   INR 1.01 05/14/2012    Assessment/Plan: POD #3 s/p Procedure(s): LEFT TOTAL KNEE ARTHROPLASTY Plan discharge to home later today after therapy - plan is for Home Health with [facility]. Veverly Fells, MD 10/03/2016 7:00 AM

## 2016-10-04 LAB — GLUCOSE, CAPILLARY
GLUCOSE-CAPILLARY: 72 mg/dL (ref 65–99)
Glucose-Capillary: 67 mg/dL (ref 65–99)
Glucose-Capillary: 85 mg/dL (ref 65–99)
Glucose-Capillary: 122 mg/dL — ABNORMAL HIGH (ref 65–99)
Glucose-Capillary: 105 mg/dL — ABNORMAL HIGH (ref 65–99)

## 2016-10-04 NOTE — Care Management Note (Signed)
Case Management Note  Patient Details  Name: [name] MRN: [id number] Date of Birth: Feb 24, 1951  Subjective/Objective:   66 yr old female s/p left total knee arthroplasty.                  Action/Plan: Case manager spoke with patient concerning discharge plan and DME needs. CM will continue to monitor, patient is not progressing well with therapy. She states she lives with her daughter and son-in-law and they will assist her at discharge.     Expected Discharge Date:    pending              Expected Discharge Plan:    pending  In-House Referral:     Discharge planning Services  CM Consult  Post Acute Care Choice:    Choice offered to:     DME Arranged:    DME Agency:     HH Arranged:    [place] Agency:     Status of Service:  In process, will continue to follow  If discussed at Long Length of Stay Meetings, dates discussed:    Additional Comments:  Ninfa Meeker, RN 10/04/2016, 3:27 PM

## 2016-10-04 NOTE — Progress Notes (Signed)
Orthopedic Tech Progress Note Patient Details:  [name] Oct 17, 1950 [id number]  CPM Left Knee CPM Left Knee: On Left Knee Flexion (Degrees): 60 Left Knee Extension (Degrees): 0 Additional Comments: Placed pt on cpm at 0-60 degree on Left knee/leg.  Pt tolerated well.  Left Knee  Placed on cpm at time: 1310    Kristopher Oppenheim 10/04/2016, 1:48 PM

## 2016-10-04 NOTE — Progress Notes (Signed)
Physical Therapy Treatment Patient Details Name: [name] MRN: [id number] DOB: [birth year] Today's Date: 10/04/2016    History of Present Illness Pt is a 66 y.o. female admitted on 09/30/16 for L TKA. 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     PT Comments    Pt continues to be limited by orthostatic hypotension. Pt reports pain a 8/10 in Left knee. Pt has redness on the anterior and lateral aspect of knee and it is warm to touch. Pt was able to complete a few steps to the chair with RW min assist. BP standing 94/59 with c/o dizziness. Pt is requesting d/c home vs. SNF placement. Pt reported she will have 24 hour support from her family. Will continue to monitor and progress mobility as tolerated. At this point do not feel the patient is safe to d/c home.  Follow Up Recommendations  Home health PT;Supervision for mobility/OOB     Equipment Recommendations  Rolling walker with 5" wheels    Recommendations for Other Services       Precautions / Restrictions Precautions Precautions: Fall;Knee Required Braces or Orthoses: Knee Immobilizer - Left Knee Immobilizer - Left: On at all times Restrictions Weight Bearing Restrictions: Yes LLE Weight Bearing: Weight bearing as tolerated Other Position/Activity Restrictions: placed pt in zero degree Foam heel support    Mobility  Bed Mobility Overal bed mobility: Needs Assistance Bed Mobility: Supine to Sit     Supine to sit: Min guard Sit to supine: Min guard   General bed mobility comments: cues for technique  Transfers Overall transfer level: Needs assistance Equipment used: Rolling walker (2 wheeled) Transfers: Sit to/from Stand Sit to Stand: Min assist            Ambulation/Gait Ambulation/Gait assistance: Museum/gallery curator (Feet): 2 Feet Assistive device: Rolling walker (2 wheeled) Gait Pattern/deviations: Step-to pattern;Decreased weight shift to  left;Decreased stance time - left     General Gait Details: gait distance restricted due to orthostatic hypotension   Stairs            Wheelchair Mobility    Modified Rankin (Stroke Patients Only)       Balance Overall balance assessment: Needs assistance   Sitting balance-Leahy Scale: Good     Standing balance support: Bilateral upper extremity supported Standing balance-Leahy Scale: Poor                              Cognition Arousal/Alertness: Awake/alert Behavior During Therapy: Flat affect Overall Cognitive Status: Within Functional Limits for tasks assessed                                 General Comments: C/o dizziness with standing, high arches in feet, Left knee warm to touch and redness surrounding anterior and lateral portion of the knee. RN notified      Exercises Total Joint Exercises Ankle Circles/Pumps: AROM;Strengthening;Both;10 reps;Supine Quad Sets: AROM;Strengthening;Left;10 reps;Supine Heel Slides: AAROM;Strengthening;Left;10 reps;Supine Hip ABduction/ADduction: AAROM;Strengthening;10 reps;Left;Supine Straight Leg Raises: AAROM;Strengthening;Left;10 reps;Supine Goniometric ROM: NT due to pain and increased redness and edema    General Comments        Pertinent Vitals/Pain Pain Score: 8  Pain Location: left knee Pain Descriptors / Indicators: Discomfort Pain Intervention(s): Limited activity within patient's tolerance;Monitored during session;Repositioned;Patient requesting pain meds-RN notified    Home Living  Prior Function            PT Goals (current goals can now be found in the care plan section) Progress towards PT goals: Not progressing toward goals - comment (Orthostatic hypotension)    Frequency    7X/week      PT Plan Current plan remains appropriate    Co-evaluation             End of Session Equipment Utilized During Treatment: Gait belt;Left  knee immobilizer Activity Tolerance: Treatment limited secondary to medical complications (Comment) Patient left: in chair;with call bell/phone within reach Nurse Communication: Mobility status PT Visit Diagnosis: Difficulty in walking, not elsewhere classified (R26.2);Muscle weakness (generalized) (M62.81) Pain - Right/Left: Left Pain - part of body: Knee     Time: 3976-7341 PT Time Calculation (min) (ACUTE ONLY): 30 min  Charges:  $Therapeutic Exercise: 8-22 mins $Therapeutic Activity: 8-22 mins                    G Codes:       Alexey Rhoads PT   [place], Colon Flattery 10/04/2016, 10:14 AM

## 2016-10-04 NOTE — Progress Notes (Signed)
   Subjective: 4 Days Post-Op Procedure(s) (LRB): LEFT TOTAL KNEE ARTHROPLASTY (Left)  Pt still having moderate to severe pain and issues with hypotension Therapy and exercises have been limited due to the pain Denies any numbness or tingling distally Patient reports pain as severe.  Objective:   VITALS:   Vitals:   10/04/16 0056 10/04/16 0438  BP: (!) 106/52 (!) 96/46  Pulse: 85 88  Resp:  18  Temp:  98.3 F (36.8 C)    Left knee incision healing well nv intact distally No rashes or edema Currently in CPM working well  LABS  Recent Labs  10/02/16 0811 10/03/16 0320  HGB 11.2* 10.0*  HCT 35.8* 32.1*  WBC 9.4 8.9  PLT 104* 107*    No results for input(s): NA, K, BUN, CREATININE, GLUCOSE in the last 72 hours.   Assessment/Plan: 4 Days Post-Op Procedure(s) (LRB): LEFT TOTAL KNEE ARTHROPLASTY (Left) Continue with PT/OT Encourage mobilization Will continue to monitor her hypotension  Pain management D/c planning    Merla Riches, MPAS, PA-C  10/04/2016, 9:24 AM

## 2016-10-04 NOTE — Care Management Important Message (Signed)
Important Message  Patient Details  Name: [name] MRN: [id number] Date of Birth: [birth year]   Medicare Important Message Given:  Yes    [name] Montine Circle 10/04/2016, 3:12 PM

## 2016-10-04 NOTE — NC FL2 (Signed)
[place] LEVEL OF CARE SCREENING TOOL     IDENTIFICATION  Patient Name: [name] Birthdate: Mar 26, 1951 Sex: female Admission Date (Current Location): 09/30/2016  [facility] and [place] Number:  Herbalist and Address:  [facility], [place] [address] [place], [place] [address]      Provider Number: [id number]  Attending Physician Name and Address:  Netta Cedars, MD  Relative Name and Phone Number:       Current Level of Care: Hospital Recommended Level of Care: [place] Prior Approval Number:    Date Approved/Denied: 10/04/16 PASRR Number: [id number]  Discharge Plan: SNF    Current Diagnoses: Patient Active Problem List   Diagnosis Date Noted  . History of total knee replacement, 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  . Neck muscle strain 08/19/2016  . Dog bite, hand, left, initial encounter 08/19/2016  . Diabetic ulcer of right foot ([place]) 01/01/2016  . Onychomycosis of multiple toenails with type 2 diabetes mellitus ([place]) 11/04/2015  . Skin lesions 11/04/2015  . Screening for colon cancer 11/04/2015  . Orthostatic syncope   . Sinusitis 06/19/2015  . Obesity (BMI 30.0-34.9) 05/27/2015  . Fatigue 05/11/2015  . Leg ulcer, left ([place]) 03/05/2015  . Recurrent falls 03/05/2015  . Primary osteoarthritis of left knee 09/22/2014  . Esophageal reflux 07/18/2014  . Hyperlipidemia associated with type 2 diabetes mellitus ([place]) 02/26/2014  . Tobacco use disorder 07/22/2013  . Headache 06/28/2013  . Diabetic peripheral neuropathy associated with type 2 diabetes mellitus ([place]) 06/14/2013  . Hx-TIA (transient ischemic attack) 03/10/2013  . Mild depression ([place]) 12/13/2012  . Diabetes mellitus type 2, controlled, with complications ([place]) 24/26/8341  . Hypertension 05/13/2012    Orientation RESPIRATION BLADDER Height & Weight     Self, Time, Situation, Place  O2 (Nasal [place] 1L) Continent Weight: 180 lb (81.6 kg) Height:  5'  6" (167.6 cm)  BEHAVIORAL SYMPTOMS/MOOD NEUROLOGICAL BOWEL NUTRITION STATUS      Continent Diet (See DC Summary)  AMBULATORY STATUS COMMUNICATION OF NEEDS Skin   Extensive Assist Verbally Surgical wounds (Closed Left knee incision with compression wrap)                       Personal Care Assistance Level of Assistance  Bathing, Dressing Bathing Assistance: Maximum assistance Feeding assistance: Limited assistance Dressing Assistance: Maximum assistance     Functional Limitations Info  Sight, Speech Sight Info: Adequate Hearing Info: Adequate Speech Info: Adequate    SPECIAL CARE FACTORS FREQUENCY  PT (By licensed PT), OT (By licensed OT)     PT Frequency: 5xweek OT Frequency: 5xweek            Contractures      Additional Factors Info  Code Status, Allergies, Insulin Sliding Scale, Psychotropic Code Status Info: Full Allergies Info: FLEXERIL CYCLOBENZAPRINE, SOMA CARISOPRODOL Psychotropic Info: Zoloft Insulin Sliding Scale Info: 15 units 3xs a day; 5 units at bedtime;  4units 3xs a day       Current Medications (10/04/2016):  This is the current hospital active medication list Current Facility-Administered Medications  Medication Dose Route Frequency Provider Last Rate Last Dose  . 0.9 %  sodium chloride infusion   Intravenous Continuous Netta Cedars, MD 50 mL/hr at 10/01/16 1827    . 0.9 %  sodium chloride infusion   Intravenous Continuous Netta Cedars, MD 75 mL/hr at 10/04/16 1038    . acetaminophen (TYLENOL) tablet 650 mg  650 mg Oral Q6H PRN Netta Cedars,  MD       Or  . acetaminophen (TYLENOL) suppository 650 mg  650 mg Rectal Q6H PRN Netta Cedars, MD      . amLODipine (NORVASC) tablet 5 mg  5 mg Oral Daily Netta Cedars, MD   Stopped at 10/04/16 1032  . aspirin chewable tablet 81 mg  81 mg Oral BID Netta Cedars, MD   81 mg at 10/04/16 1031  . aspirin-acetaminophen-caffeine (EXCEDRIN MIGRAINE) per tablet 2 tablet  2 tablet Oral Q8H PRN Netta Cedars, MD       . bethanechol (URECHOLINE) tablet 10 mg  10 mg Oral TID PRN Ardeen Jourdain, PA-C   10 mg at 10/02/16 1820  . docusate sodium (COLACE) capsule 100 mg  100 mg Oral BID Netta Cedars, MD   100 mg at 10/04/16 1032  . ferrous sulfate tablet 325 mg  325 mg Oral TID PC Netta Cedars, MD   325 mg at 10/04/16 9417  . fluticasone (FLONASE) 50 MCG/ACT nasal spray 2 spray  2 spray Each Nare Daily Netta Cedars, MD   2 spray at 10/04/16 1034  . gabapentin (NEURONTIN) tablet 1,200 mg  1,200 mg Oral TID Netta Cedars, MD   1,200 mg at 10/04/16 1031  . glipiZIDE (GLUCOTROL XL) 24 hr tablet 10 mg  10 mg Oral Q breakfast Netta Cedars, MD   10 mg at 10/04/16 0839  . insulin aspart (novoLOG) injection 0-15 Units  0-15 Units Subcutaneous TID WC Netta Cedars, MD   3 Units at 10/03/16 1229  . insulin aspart (novoLOG) injection 0-5 Units  0-5 Units Subcutaneous QHS Netta Cedars, MD      . insulin aspart (novoLOG) injection 4 Units  4 Units Subcutaneous TID WC Netta Cedars, MD   4 Units at 10/03/16 1735  . linagliptin (TRADJENTA) tablet 5 mg  5 mg Oral Daily Netta Cedars, MD   5 mg at 10/04/16 1032  . lisinopril (PRINIVIL,ZESTRIL) tablet 40 mg  40 mg Oral Daily Netta Cedars, MD   Stopped at 10/04/16 1033  . menthol-cetylpyridinium (CEPACOL) lozenge 3 mg  1 lozenge Oral PRN Netta Cedars, MD       Or  . phenol [facility]) mouth spray 1 spray  1 spray Mouth/Throat PRN Netta Cedars, MD      . metFORMIN (GLUCOPHAGE-XR) 24 hr tablet 750 mg  750 mg Oral Q breakfast Netta Cedars, MD   750 mg at 10/04/16 0839  . methocarbamol (ROBAXIN) tablet 500 mg  500 mg Oral Q6H PRN Netta Cedars, MD   500 mg at 10/03/16 0948   Or  . methocarbamol (ROBAXIN) 500 mg in dextrose 5 % 50 mL IVPB  500 mg Intravenous Q6H PRN Netta Cedars, MD      . metoCLOPramide (REGLAN) tablet 5-10 mg  5-10 mg Oral Q8H PRN Netta Cedars, MD       Or  . metoCLOPramide (REGLAN) injection 5-10 mg  5-10 mg Intravenous Q8H PRN Netta Cedars, MD      . morphine 2 MG/ML  injection 2-4 mg  2-4 mg Intravenous Q2H PRN Netta Cedars, MD   2 mg at 10/01/16 0551  . ondansetron (ZOFRAN) tablet 4 mg  4 mg Oral Q6H PRN Netta Cedars, MD       Or  . ondansetron [facility]) injection 4 mg  4 mg Intravenous Q6H PRN Netta Cedars, MD   4 mg at 10/01/16 1317  . oxyCODONE (Oxy IR/ROXICODONE) immediate release tablet 5-10 mg  5-10 mg Oral Q3H PRN Netta Cedars, MD  10 mg at 10/04/16 1031  . pantoprazole (PROTONIX) EC tablet 20 mg  20 mg Oral Daily Netta Cedars, MD   20 mg at 10/04/16 1035  . polyethylene glycol (MIRALAX / GLYCOLAX) packet 17 g  17 g Oral Daily PRN Netta Cedars, MD      . rosuvastatin (CRESTOR) tablet 40 mg  40 mg Oral Daily Netta Cedars, MD   40 mg at 10/04/16 1031  . sertraline (ZOLOFT) tablet 50 mg  50 mg Oral Daily Netta Cedars, MD   50 mg at 10/04/16 1031  . traMADol (ULTRAM) tablet 50 mg  50 mg Oral Q6H PRN Latanya Maudlin, MD   50 mg at 10/03/16 1928     Discharge Medications: Please see discharge summary for a list of discharge medications.  Relevant Imaging Results:  Relevant Lab Results:   Additional Information [phone]  Normajean Baxter, LCSW

## 2016-10-04 NOTE — Progress Notes (Signed)
Physical Therapy Treatment Patient Details Name: [name] MRN: [id number] DOB: 11/05/1950 Today's Date: 10/04/2016    History of Present Illness Pt is a 66 y.o. female admitted on 09/30/16 for L TKA. 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     PT Comments    Attempted to see pt for her BID treatment. Pt verbalized increased Left knee pain. Pt continues to have redness, edema, and increased warmth in her anterior and lateral knee. Pt reported it was more difficult to get back in the bed after our morning session. Pt was agreeable to bed exercises. Pt demonstrated a very poor left quad contraction. Pt was not able to tolerate PROM knee flexion greater than 20* this afternoon. Spoke with RN regarding concerns and she verbalized she will follow-up with the MD. Will continue to monitor pt and hope to progress mobility as tolerated.  Follow Up Recommendations  Home health PT;Supervision for mobility/OOB     Equipment Recommendations  Rolling walker with 5" wheels    Recommendations for Other Services       Precautions / Restrictions Precautions Precautions: Fall;Knee Required Braces or Orthoses: Knee Immobilizer - Left Knee Immobilizer - Left: On at all times Restrictions Weight Bearing Restrictions: Yes LLE Weight Bearing: Weight bearing as tolerated Other Position/Activity Restrictions: placed pt in zero degree Foam heel support    Mobility  Bed Mobility Overal bed mobility: Needs Assistance Bed Mobility: Supine to Sit     Supine to sit: Min guard Sit to supine: Min guard   General bed mobility comments: cues for technique  Transfers Overall transfer level: Needs assistance Equipment used: Rolling walker (2 wheeled) Transfers: Sit to/from Stand Sit to Stand: Min assist            Ambulation/Gait Ambulation/Gait assistance: Museum/gallery curator (Feet): 2 Feet Assistive device: Rolling walker (2  wheeled) Gait Pattern/deviations: Step-to pattern;Decreased weight shift to left;Decreased stance time - left     General Gait Details: gait distance restricted due to orthostatic hypotension   Stairs            Wheelchair Mobility    Modified Rankin (Stroke Patients Only)       Balance Overall balance assessment: Needs assistance   Sitting balance-Leahy Scale: Good     Standing balance support: Bilateral upper extremity supported Standing balance-Leahy Scale: Poor                              Cognition Arousal/Alertness: Awake/alert Behavior During Therapy: Flat affect Overall Cognitive Status: Within Functional Limits for tasks assessed                                 General Comments: Pt reported it was more difficult getting back into bed from the recliner. Pt continues to have L knee redness, edema, and warm to the touch. Pt was not able to tolerate Knee flexion over 20* without significant pain.  Spoke directly to the RN who acknowledge my concerns and stated she will get in touch with the ortho MD.      Exercises Total Joint Exercises Ankle Circles/Pumps: AROM;Both;10 reps;Supine Quad Sets: AROM;Strengthening;Left;10 reps (poor contraction) Heel Slides: AAROM;Strengthening;Left;5 reps;Supine Hip ABduction/ADduction: AAROM;Strengthening;10 reps;Left;Supine Straight Leg Raises: AAROM;Strengthening;Left;5 reps;Supine Goniometric ROM: NT due to pain and increased redness and edema    General Comments  Pertinent Vitals/Pain Pain Assessment: 0-10 Pain Score: 8  Pain Location: left knee Pain Descriptors / Indicators: Discomfort Pain Intervention(s): Limited activity within patient's tolerance;Monitored during session;Patient requesting pain meds-RN notified    Home Living                      Prior Function            PT Goals (current goals can now be found in the care plan section) Progress towards PT goals:  Not progressing toward goals - comment (knee pain, warmth, and redness. orthstatic hypotension)    Frequency    7X/week      PT Plan Current plan remains appropriate    Co-evaluation             End of Session Equipment Utilized During Treatment: Gait belt;Left knee immobilizer Activity Tolerance: Patient limited by pain Patient left: in bed;with call bell/phone within reach Nurse Communication: Mobility status PT Visit Diagnosis: Difficulty in walking, not elsewhere classified (R26.2);Muscle weakness (generalized) (M62.81) Pain - Right/Left: Left Pain - part of body: Knee     Time: 8937-3428 PT Time Calculation (min) (ACUTE ONLY): 19 min  Charges:  $Therapeutic Exercise: 8-22 mins $Therapeutic Activity: 8-22 mins                    G Codes:       Kamir Selover PT   Kaleem Sartwell Kerstine 10/04/2016, 1:06 PM

## 2016-10-04 NOTE — Care Management (Signed)
Case manager has spoken with patient concerning discharge plan. CM also spoke with Dr. Gilberto Better PA, Merla Riches to discuss discharge plan. Patient will work with therapy more with the intention for patient to discharge Home with [facility]. CM will continue to monitor.

## 2016-10-04 NOTE — Progress Notes (Signed)
Inpatient Diabetes Program Recommendations  AACE/ADA: New Consensus Statement on Inpatient Glycemic Control (2015)  Target Ranges:  Prepandial:   less than 140 mg/dL      Peak postprandial:   less than 180 mg/dL (1-2 hours)      Critically ill patients:  140 - 180 mg/dL   Lab Results  Component Value Date   GLUCAP 122 (H) 10/04/2016   HGBA1C 7.0 08/19/2016    Review of Glycemic Control Results for [name], INSCORE (MRN [id number]) as of 10/04/2016 12:25  Ref. Range 10/03/2016 16:59 10/03/2016 22:02 10/04/2016 06:35 10/04/2016 07:24 10/04/2016 11:22  Glucose-Capillary Latest Ref Range: 65 - 99 mg/dL 115 (H) 163 (H) 67 85 122 (H)   Inpatient Diabetes Program Recommendations:  Please consider -Discontinue meal coverage  -Decrease Novolog correction to sensitive  Thank you, Bethena Roys E. Kenan Moodie, RN, MSN, CDE  Diabetes Coordinator Inpatient Glycemic Control Team Team Pager [phone] (8am-5pm) 10/04/2016 12:25 PM

## 2016-10-04 NOTE — Clinical Social Work Note (Signed)
Clinical Social Work Assessment  Patient Details  Name: [name] MRN: [id number] Date of Birth: [birth year]  Date of referral:  10/04/16               Reason for consult:  Facility Placement                Permission sought to share information with:  Chartered certified accountant granted to share information::  Yes, Verbal Permission Granted  Name::     Metallurgist::  SNF  Relationship::  daughter  Contact Information:     Housing/Transportation Living arrangements for the past 2 months:    Source of Information:  Patient Patient Interpreter Needed:  None Criminal Activity/Legal Involvement Pertinent to Current Situation/Hospitalization:  No - Comment as needed Significant Relationships:  Adult Children Lives with:  Adult Children Do you feel safe going back to the place where you live?  No Need for family participation in patient care:  Yes (Comment)  Care giving concerns:  Patient was residing at home with family prior to hospitalization. Patient unsafe to return home at this time and does not have the support to care for her.   Social Worker assessment / plan:  CSW met with patient today and discussed recommendations from PT for further rehabilitation needs. CSw explained the SNF options and placement. CSW obtained permission to discuss with daughter [name]. CSW provided pt with list of SNF and patient selected [facility] for nursing needs.  Employment status:  Retired Forensic scientist:  Medicare PT Recommendations:  [place] / Referral to community resources:  [place]  Patient/Family's Response to care:   Patient is appreciative of CSW assistance with placement options. No issues with care identified at this time.  Patient/Family's Understanding of and Emotional Response to Diagnosis, Current Treatment, and Prognosis:  Patient is understanding of the diagnosis, current treatment and prognosis for DC.  Patient is aware and understands the need for short term rehabilitation. She is hopeful that rehabilitation will improve her physical impairment.  Emotional Assessment Appearance:  Appears stated age Attitude/Demeanor/Rapport:   (Cooperative) Affect (typically observed):  Accepting, Appropriate Orientation:  Oriented to Self, Oriented to Place, Oriented to  Time, Oriented to Situation Alcohol / Substance use:  Not Applicable Psych involvement (Current and /or in the community):  No (Comment)  Discharge Needs  Concerns to be addressed:  Care Coordination Readmission within the last 30 days:  No Current discharge risk:  Dependent with Mobility, Physical Impairment Barriers to Discharge:  No Barriers Identified   Normajean Baxter, LCSW 10/04/2016, 2:22 PM

## 2016-10-05 LAB — GLUCOSE, CAPILLARY
GLUCOSE-CAPILLARY: 145 mg/dL — AB (ref 65–99)
GLUCOSE-CAPILLARY: 50 mg/dL — AB (ref 65–99)
GLUCOSE-CAPILLARY: 104 mg/dL — AB (ref 65–99)
GLUCOSE-CAPILLARY: 70 mg/dL (ref 65–99)
Glucose-Capillary: 96 mg/dL (ref 65–99)

## 2016-10-05 MED ORDER — CEPHALEXIN 500 MG PO CAPS
500.0000 mg | ORAL_CAPSULE | Freq: Four times a day (QID) | ORAL | Status: DC
  Administered 2016-10-05 – 2016-10-06 (×6): 500 mg via ORAL
  Filled 2016-10-05 (×6): qty 1

## 2016-10-05 MED ORDER — CEPHALEXIN 500 MG PO CAPS: 500.0000 mg | ORAL_CAPSULE | Freq: Three times a day (TID) | ORAL | 0 refills | Status: DC

## 2016-10-05 NOTE — Care Management Note (Signed)
Case Management Note  Patient Details  Name: [name] MRN: [id number] Date of Birth: Apr 18, 1951  Subjective/Objective:                 Spoke with patient and daughter, they are in agreement that patient can be managed safely at home. HH through [facility] arranged, RW ordered and will be delivered to room ptrior to DC by [facility]. Patientand daughter deny need for other DME.    Action/Plan:  DC to home with [facility] and RW Expected Discharge Date:  10/05/16               Expected Discharge Plan:  [place]  In-House Referral:     Discharge planning Services  CM Consult  Post Acute Care Choice:  Home Health, Durable Medical Equipment Choice offered to:  Patient, Adult Children  DME Arranged:  [facility] rolling DME Agency:  [place]:  PT [place]:  [facility] (now [facility])  Status of Service:  Completed, signed off  If discussed at [employer] of Stay Meetings, dates discussed:    Additional Comments:  Carles Collet, RN 10/05/2016, 10:54 AM

## 2016-10-05 NOTE — Progress Notes (Signed)
Pt orthostatic while working with therapy, lightheaded and BP dropped.  Pt doesn't think she can discharge today.  MD made aware.  Will continue to monitor.  Cori Razor, RN

## 2016-10-05 NOTE — Progress Notes (Signed)
Physical Therapy Treatment Patient Details Name: [name] MRN: [id number] DOB: 1950-07-17 Today's Date: 10/05/2016    History of Present Illness Pt is a 66 y.o. female admitted on 09/30/16 for L TKA. 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     PT Comments    Patient performed HEP in supine. Mod assist needed to perform SAQ due to significant weakness in the L quads. Pt relays heavily on bed rails to perform bed mobilities and requires increased time. Pt unable to perform sit<>stand from bed when bed was lowered, however could perform sit<>stand when bed was elevated with min assist. Upon standing pt c/o light headedness and a headache and requested to sit back down. Orthostatic vitals were taken and pts bp dropped from 115/64 to 97/53. Gait training was not attempted due to pt being symptomatic. Notified nurse of pt's symptoms and pts cognitive status (loss of focus and mumbling incoherent sentences at times).    Follow Up Recommendations  SNF     Equipment Recommendations  Rolling walker with 5" wheels    Recommendations for Other Services       Precautions / Restrictions Precautions Precautions: Fall;Knee Precaution Booklet Issued: Yes (comment) Precaution Comments: educated on knee precautions. Required Braces or Orthoses: Knee Immobilizer - Left Knee Immobilizer - Left: On at all times Restrictions Weight Bearing Restrictions: Yes LLE Weight Bearing: Weight bearing as tolerated    Mobility  Bed Mobility Overal bed mobility: Needs Assistance Bed Mobility: Supine to Sit Rolling: Min assist   Supine to sit: Min assist;Min guard Sit to supine: Min guard;Min assist   General bed mobility comments: cues for technique and assistance to progress LLE onto bed  Transfers Overall transfer level: Needs assistance Equipment used: Rolling walker (2 wheeled) Transfers: Sit to/from Stand Sit to Stand: Min assist;From  elevated surface;Mod assist         General transfer comment: Pt unable to stand after two attempts with mod assist; however was successful standing from elevated surface on thrid attempt with min assist.   Ambulation/Gait             General Gait Details: Unable to attempt as pt was symptomatic for orthostatic hypotension   Stairs            Wheelchair Mobility    Modified Rankin (Stroke Patients Only)       Balance Overall balance assessment: Needs assistance Sitting-balance support: Feet supported;Bilateral upper extremity supported Sitting balance-Leahy Scale: Poor Sitting balance - Comments: pt required bil UE support when seated   Standing balance support: Bilateral upper extremity supported Standing balance-Leahy Scale: Poor Standing balance comment: requires UE support on RW                            Cognition Arousal/Alertness: Awake/alert Behavior During Therapy: Flat affect Overall Cognitive Status: Within Functional Limits for tasks assessed                                 General Comments: Pt appeared to lose focus at times and would mumble incohearent sentences. RN notified.      Exercises Total Joint Exercises Ankle Circles/Pumps: AROM;Both;10 reps;Supine Quad Sets: AROM;Strengthening;Left;10 reps (poor contraction) Towel Squeeze: AROM;Strengthening;Left;5 reps;Supine Short Arc Quad: AAROM;Left;5 reps;Supine (mod assist for pt to perform) Heel Slides: AAROM;Strengthening;Left;5 reps;Supine Hip ABduction/ADduction: AAROM;Strengthening;Left;Supine;Other reps (comment) (pt fatigued  after 3 reps)    General Comments        Pertinent Vitals/Pain Pain Assessment: Faces Faces Pain Scale: Hurts little more Pain Location: left knee Pain Descriptors / Indicators: Discomfort Pain Intervention(s): Limited activity within patient's tolerance;Monitored during session;Repositioned    Home Living                       Prior Function            PT Goals (current goals can now be found in the care plan section) Acute Rehab PT Goals Patient Stated Goal: to go home PT Goal Formulation: With patient Time For Goal Achievement: 10/08/16 Potential to Achieve Goals: Fair Progress towards PT goals: Not progressing toward goals - comment (patient limited by pain and orthostatic hypotension.)    Frequency    7X/week      PT Plan Current plan remains appropriate    Co-evaluation             End of Session Equipment Utilized During Treatment: Gait belt;Left knee immobilizer Activity Tolerance: Patient limited by pain Patient left: in bed;with call bell/phone within reach Nurse Communication: Mobility status PT Visit Diagnosis: Difficulty in walking, not elsewhere classified (R26.2);Muscle weakness (generalized) (M62.81) Pain - Right/Left: Left Pain - part of body: Knee     Time: 9357-0177 PT Time Calculation (min) (ACUTE ONLY): 50 min  Charges:  $Therapeutic Exercise: 8-22 mins $Therapeutic Activity: 23-37 mins                    G Codes:       [name], PTA Acute Rehab [place] 10/05/2016, 12:33 PM

## 2016-10-05 NOTE — Progress Notes (Signed)
Inpatient Diabetes Program Recommendations  AACE/ADA: New Consensus Statement on Inpatient Glycemic Control (2015)  Target Ranges:  Prepandial:   less than 140 mg/dL      Peak postprandial:   less than 180 mg/dL (1-2 hours)      Critically ill patients:  140 - 180 mg/dL   Results for [name], [name] (MRN [id number]) as of 10/05/2016 10:59  Ref. Range 10/04/2016 06:35 10/04/2016 07:24 10/04/2016 11:22 10/04/2016 16:50 10/04/2016 21:12 10/05/2016 06:32 10/05/2016 07:06  Glucose-Capillary Latest Ref Range: 65 - 99 mg/dL 67 85 122 (H) 105 (H) 72 50 (L) 145 (H)   Review of Glycemic Control  Diabetes history: DM 2 Outpatient Diabetes medications: Glipizide 10 mg Daily with supper, Metformin 1500 mg Daily, Januvia 100 mg Qevening Current orders for Inpatient glycemic control:   Inpatient Diabetes Program Recommendations:   Patient with hypoglycemia. Please consider  -Discontinuing Novolog meal coverage  -Decrease Novolog correction to sensitive (0-9 units) tid  Thanks,  Tama Headings RN, MSN, [facility] Inpatient Diabetes Coordinator Team Pager [phone] (8a-5p)

## 2016-10-05 NOTE — Progress Notes (Signed)
Orthopedics Progress Note  Subjective: Patient still complaining of pain this morning. No BM yet  Objective:  Vitals:   10/04/16 2055 10/05/16 0420  BP: 95/74 (!) 95/46  Pulse: 78 79  Resp: 16 16  Temp: 98.3 F (36.8 C) 98.4 F (36.9 C)    General: Awake and alert  Musculoskeletal:left knee incision intact with some mild erythema. ROM 5-70 AAROM Neurovascularly intact  Lab Results  Component Value Date   WBC 8.9 10/03/2016   HGB 10.0 (L) 10/03/2016   HCT 32.1 (L) 10/03/2016   MCV 95.8 10/03/2016   PLT 107 (L) 10/03/2016       Component Value Date/Time   NA 137 10/01/2016 0548   NA 139 02/25/2015 1614   K 4.1 10/01/2016 0548   CL 108 10/01/2016 0548   CO2 21 (L) 10/01/2016 0548   GLUCOSE 116 (H) 10/01/2016 0548   BUN 12 10/01/2016 0548   BUN 18 02/25/2015 1614   CREATININE 1.31 (H) 10/01/2016 0548   CREATININE 0.76 09/22/2014 1036   CALCIUM 8.4 (L) 10/01/2016 0548   CALCIUM 10.3 08/20/2012 1402   GFRNONAA 42 (L) 10/01/2016 0548   GFRNONAA 85 09/22/2014 1036   GFRAA 49 (L) 10/01/2016 0548   GFRAA >89 09/22/2014 1036    Lab Results  Component Value Date   INR 0.95 09/28/2013   INR 0.92 03/09/2013   INR 1.01 05/14/2012    Assessment/Plan: POD #5 s/p Procedure(s): LEFT TOTAL KNEE ARTHROPLASTY D/C home today with home health therapy Start Keflex for knee incision erythema Follow up in 10 days in the office DVT prophylaxis Dressing change prior to discharge  Mitchell. Veverly Fells, MD 10/05/2016 7:54 AM

## 2016-10-05 NOTE — Progress Notes (Addendum)
Physical Therapy Treatment Patient Details Name: [name] MRN: [id number] DOB: 10-06-1950 Today's Date: 10/05/2016    History of Present Illness Pt is a 66 y.o. female admitted on 09/30/16 for L TKA. 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     PT Comments    Pt performed increased activity and progressed to gait during afternoon session.  PTA donned ted hose pre tx and patient with no c/o dizziness during mobility.  Pt able to perform smal bouts of gait and transfers from bed and commode in bathroom.  Pt tolerated supine exercises in chair.   Plan for stair training in am session.  Will inform supervising PT of patient progress and need for change in recommendations back to HHPT with support from her family.     Follow Up Recommendations  Supervision for mobility/OOB;Home health PT     Equipment Recommendations  Rolling walker with 5" wheels    Recommendations for Other Services       Precautions / Restrictions Precautions Precautions: Fall;Knee Precaution Booklet Issued: Yes (comment) Precaution Comments: educated on knee precautions. Required Braces or Orthoses: Knee Immobilizer - Left Knee Immobilizer - Left: On at all times (did not use for tx) Restrictions Weight Bearing Restrictions: Yes LLE Weight Bearing: Weight bearing as tolerated Other Position/Activity Restrictions: placed pt in zero degree Foam heel support    Mobility  Bed Mobility Overal bed mobility: Needs Assistance Bed Mobility: Supine to Sit     Supine to sit: Supervision     General bed mobility comments: No assistance needed to achieve sitting edge of bed, increased time to complete and + use of rail to achieve sitting.    Transfers Overall transfer level: Needs assistance Equipment used: Rolling walker (2 wheeled) Transfers: Sit to/from Stand Sit to Stand: Min guard Stand pivot transfers: Min guard       General transfer comment:  Cues for hand placement and positioning of feet.  Pt able to achieve standing with decreased assistance.    Ambulation/Gait Ambulation/Gait assistance: Min guard;Supervision Ambulation Distance (Feet): 25 Feet (+ 15 ft) Assistive device: Rolling walker (2 wheeled) Gait Pattern/deviations: Step-through pattern;Decreased stride length;Trunk flexed Gait velocity: slowed Gait velocity interpretation: Below normal speed for age/gender General Gait Details: Cues for upper trunk control, cues for sequencing and RW safety with progression of gait and turning to back and sit on seated surfaces.     Stairs            Wheelchair Mobility    Modified Rankin (Stroke Patients Only)       Balance Overall balance assessment: Needs assistance Sitting-balance support: Feet supported;Bilateral upper extremity supported Sitting balance-Leahy Scale: Poor       Standing balance-Leahy Scale: Poor                              Cognition Arousal/Alertness: Awake/alert Behavior During Therapy: Flat affect;WFL for tasks assessed/performed Overall Cognitive Status: Within Functional Limits for tasks assessed                                        Exercises Total Joint Exercises Ankle Circles/Pumps: AROM;Both;10 reps;Supine Quad Sets: AROM;Left;10 reps;Supine Heel Slides: AAROM;Left;10 reps;Supine Hip ABduction/ADduction: AAROM;Left;10 reps;Supine Straight Leg Raises: AAROM;Left;10 reps;Supine Goniometric ROM: Grossly 70 degrees flexion to L knee.  General Comments        Pertinent Vitals/Pain Pain Assessment: Faces Pain Score: 6  Pain Location: left knee Pain Descriptors / Indicators: Discomfort Pain Intervention(s): Monitored during session;Repositioned    Home Living                      Prior Function            PT Goals (current goals can now be found in the care plan section) Acute Rehab PT Goals Patient Stated Goal: to go  home Potential to Achieve Goals: Fair Progress towards PT goals: Progressing toward goals    Frequency    7X/week      PT Plan Discharge plan needs to be updated    Co-evaluation             End of Session Equipment Utilized During Treatment: Gait belt Activity Tolerance: Patient limited by pain Patient left: in bed;with call bell/phone within reach Nurse Communication: Mobility status PT Visit Diagnosis: Difficulty in walking, not elsewhere classified (R26.2);Muscle weakness (generalized) (M62.81) Pain - Right/Left: Left Pain - part of body: Knee     Time: 4970-2637 PT Time Calculation (min) (ACUTE ONLY): 39 min  Charges:  $Gait Training: 8-22 mins $Therapeutic Exercise: 8-22 mins $Therapeutic Activity: 8-22 mins                    G Codes:       Governor Rooks, PTA pager [phone]    Cristela Blue 10/05/2016, 3:33 PM

## 2016-10-06 ENCOUNTER — Encounter (HOSPITAL_COMMUNITY): Payer: Self-pay | Admitting: General Practice

## 2016-10-06 LAB — GLUCOSE, CAPILLARY
GLUCOSE-CAPILLARY: 219 mg/dL — AB (ref 65–99)
Glucose-Capillary: 85 mg/dL (ref 65–99)
Glucose-Capillary: 92 mg/dL (ref 65–99)

## 2016-10-06 MED ORDER — FLEET ENEMA 7-19 GM/118ML RE ENEM: 1.0000 | ENEMA | Freq: Every day | RECTAL | Status: DC | PRN

## 2016-10-06 MED ORDER — BISACODYL 10 MG RE SUPP
10.0000 mg | Freq: Once | RECTAL | Status: AC
  Administered 2016-10-06: 10 mg via RECTAL
  Filled 2016-10-06: qty 1

## 2016-10-06 MED ORDER — MORPHINE SULFATE (PF) 4 MG/ML IV SOLN: 2.0000 mg | INTRAVENOUS | Status: DC | PRN

## 2016-10-06 MED ORDER — ENSURE ENLIVE PO LIQD
237.0000 mL | Freq: Two times a day (BID) | ORAL | Status: DC
  Administered 2016-10-06 (×2): 237 mL via ORAL

## 2016-10-06 NOTE — Progress Notes (Addendum)
Physical Therapy Treatment Patient Details Name: [name] MRN: [id number] DOB: 1951/03/11 Today's Date: 10/06/2016    History of Present Illness Pt is a 66 y.o. female admitted on 09/30/16 for L TKA. 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     PT Comments    Pt is progressing towards her goals. Pt is supervision for bed mobility, min guard for transfers to RW and ambulation of 175 feet with RW. Pt min guard with ascend/descend of 2 stairs with bilateral railing. Pt is ready for discharge from acute setting but continues to require skilled PT to progress ambulation and stair training and to improve LE strength and endurance to safely navigate her home environment.     Follow Up Recommendations  Supervision for mobility/OOB;Home health PT     Equipment Recommendations  Rolling walker with 5" wheels       Precautions / Restrictions Precautions Precautions: Fall;Knee Required Braces or Orthoses: Knee Immobilizer - Left Restrictions Weight Bearing Restrictions: Yes LLE Weight Bearing: Weight bearing as tolerated    Mobility  Bed Mobility Overal bed mobility: Needs Assistance Bed Mobility: Supine to Sit     Supine to sit: Supervision     General bed mobility comments: sitting EoB with OT on entry  Transfers Overall transfer level: Needs assistance Equipment used: Rolling walker (2 wheeled) Transfers: Sit to/from Stand Sit to Stand: Min guard Stand pivot transfers: Min guard       General transfer comment: cues for hand placement on RW grips not the frame of the RW  Ambulation/Gait Ambulation/Gait assistance: Min guard Ambulation Distance (Feet): 175 Feet Assistive device: Rolling walker (2 wheeled) Gait Pattern/deviations: Decreased step length - right;Decreased stance time - left;Step-to pattern;Trunk flexed;Antalgic Gait velocity: slowed Gait velocity interpretation: Below normal speed for  age/gender General Gait Details: pt with increased UE use on RW causing increased UE soreness, vc for decreased UE support  and upright posture, as well as staying inside the walker with ambulation.    Stairs Stairs: Yes   Stair Management: Two rails;Forwards;Step to pattern Number of Stairs: 2 General stair comments: vc for sequencing and hand placement for optimal pull up., pt with steady ascent/descent    Balance Overall balance assessment: Needs assistance Sitting-balance support: Feet supported Sitting balance-Leahy Scale: Fair Sitting balance - Comments: pt able to maintain balance without UE support    Standing balance support: Bilateral upper extremity supported Standing balance-Leahy Scale: Poor Standing balance comment: requires RW support                            Cognition Arousal/Alertness: Awake/alert Behavior During Therapy: WFL for tasks assessed/performed Overall Cognitive Status: Within Functional Limits for tasks assessed                                           General Comments General comments (skin integrity, edema, etc.): Pt becomes very fatigued with activity       Pertinent Vitals/Pain Pain Assessment: 0-10 Pain Score: 5  Pain Location: left knee  Pain Descriptors / Indicators: Constant;Discomfort;Tightness;Sore Pain Intervention(s): Monitored during session;Limited activity within patient's tolerance  VSS           PT Goals (current goals can now be found in the care plan section) Acute Rehab PT Goals PT Goal Formulation: With patient  Time For Goal Achievement: 10/08/16 Potential to Achieve Goals: Fair Progress towards PT goals: Progressing toward goals    Frequency    7X/week      PT Plan Current plan remains appropriate    Co-evaluation             End of Session Equipment Utilized During Treatment: Gait belt Activity Tolerance: Patient limited by fatigue Patient left: in chair;with call  bell/phone within reach Nurse Communication: Mobility status (ready for d/c) PT Visit Diagnosis: Difficulty in walking, not elsewhere classified (R26.2);Muscle weakness (generalized) (M62.81) Pain - Right/Left: Left Pain - part of body: Knee     Time: 0802-2336 PT Time Calculation (min) (ACUTE ONLY): 27 min  Charges:  $Gait Training: 23-37 mins                    G Codes:       Jayjay Littles B. Migdalia Dk PT, DPT Acute Rehabilitation  [phone] Pager [phone]     [place] 10/06/2016, 1:12 PM

## 2016-10-06 NOTE — Progress Notes (Signed)
Orthopedics Progress Note  Subjective: Patient reports improvement in mobility  Objective:  Vitals:   10/05/16 1900 10/06/16 0529  BP: (!) 120/50 (!) 113/58  Pulse: 85 71  Resp: 16 18  Temp: 98.1 F (36.7 C) 98.2 F (36.8 C)    General: Awake and alert  Musculoskeletal: Left knee dressing intact, no erythema and no swelling, no cords Neurovascularly intact  Lab Results  Component Value Date   WBC 8.9 10/03/2016   HGB 10.0 (L) 10/03/2016   HCT 32.1 (L) 10/03/2016   MCV 95.8 10/03/2016   PLT 107 (L) 10/03/2016       Component Value Date/Time   NA 137 10/01/2016 0548   NA 139 02/25/2015 1614   K 4.1 10/01/2016 0548   CL 108 10/01/2016 0548   CO2 21 (L) 10/01/2016 0548   GLUCOSE 116 (H) 10/01/2016 0548   BUN 12 10/01/2016 0548   BUN 18 02/25/2015 1614   CREATININE 1.31 (H) 10/01/2016 0548   CREATININE 0.76 09/22/2014 1036   CALCIUM 8.4 (L) 10/01/2016 0548   CALCIUM 10.3 08/20/2012 1402   GFRNONAA 42 (L) 10/01/2016 0548   GFRNONAA 85 09/22/2014 1036   GFRAA 49 (L) 10/01/2016 0548   GFRAA >89 09/22/2014 1036    Lab Results  Component Value Date   INR 0.95 09/28/2013   INR 0.92 03/09/2013   INR 1.01 05/14/2012    Assessment/Plan: POD #5 s/p Procedure(s): LEFT TOTAL KNEE ARTHROPLASTY D/C to home after patient has BM and has her therapy sessions, home health should be arranged No home CPM Follow up two weeks from surgery in the office   Remo Lipps R. Veverly Fells, MD 10/06/2016 7:46 AM

## 2016-10-06 NOTE — Progress Notes (Signed)
Called daughter and she is on her way to take pt. home

## 2016-10-06 NOTE — Progress Notes (Signed)
Discharged Pt. Reviewed AVS. Patient verbalized understanding. PT signed off on pt. Pt. Is ready for discharge

## 2016-10-06 NOTE — Progress Notes (Signed)
Occupational Therapy Treatment Patient Details Name: [name] MRN: [id number] DOB: Feb 10, 1951 Today's Date: 10/06/2016    History of present illness Pt is a 66 y.o. female admitted on 09/30/16 for L TKA. 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    OT comments  Pt now going home with children to A her  Follow Up Recommendations  Home health OT;Supervision/Assistance - 24 hour    Equipment Recommendations  3 in 1 bedside commode;Hospital bed       Precautions / Restrictions Precautions Precautions: Fall;Knee Required Braces or Orthoses: Knee Immobilizer - Left Restrictions Weight Bearing Restrictions: Yes LLE Weight Bearing: Weight bearing as tolerated       Mobility Bed Mobility Overal bed mobility: Needs Assistance Bed Mobility: Supine to Sit     Supine to sit: Supervision        Transfers Overall transfer level: Needs assistance Equipment used: Rolling walker (2 wheeled) Transfers: Sit to/from Stand Sit to Stand: Min guard Stand pivot transfers: Min guard                ADL either performed or assessed with clinical judgement   ADL Overall ADL's : Needs assistance/impaired                 Upper Body Dressing : Set up;Sitting   Lower Body Dressing: Minimal assistance;Sit to/from stand;Cueing for safety;Cueing for sequencing;Cueing for compensatory techniques       Toileting- Clothing Manipulation and Hygiene: Sit to/from stand;Minimal assistance;Cueing for safety;Cueing for sequencing;Cueing for compensatory techniques     Tub/Shower Transfer Details (indicate cue type and reason): verbalized safety                   Cognition Arousal/Alertness: Awake/alert Behavior During Therapy: WFL for tasks assessed/performed Overall Cognitive Status: Within Functional Limits for tasks assessed                                                     Pertinent Vitals/  Pain       Pain Score: 3  Pain Location: left knee Pain Descriptors / Indicators: Discomfort;Sore Pain Intervention(s): Monitored during session;Repositioned         Frequency  Min 2X/week        Progress Toward Goals  OT Goals(current goals can now be found in the care plan section)  Progress towards OT goals: Progressing toward goals     Plan Discharge plan needs to be updated       End of Session Equipment Utilized During Treatment: Rolling walker CPM Left Knee CPM Left Knee: Off  OT Visit Diagnosis: Pain Pain - Right/Left: Left Pain - part of body: Leg   Activity Tolerance Patient tolerated treatment well   Patient Left in bed;with call bell/phone within reach;Other (comment) (sittingEOB.  PTcoming in)   Nurse Communication Mobility status;Precautions        Time: 4081-4481 OT Time Calculation (min): 15 min  Charges: OT General Charges $OT Visit: 1 Procedure OT Treatments $Self Care/Home Management : 8-22 mins  [name], [name]   [name] 10/06/2016, 11:15 AM

## 2016-10-07 DIAGNOSIS — I110 Hypertensive heart disease with heart failure: Secondary | ICD-10-CM | POA: Diagnosis not present

## 2016-10-07 DIAGNOSIS — G8929 Other chronic pain: Secondary | ICD-10-CM | POA: Diagnosis not present

## 2016-10-07 DIAGNOSIS — J449 Chronic obstructive pulmonary disease, unspecified: Secondary | ICD-10-CM | POA: Diagnosis not present

## 2016-10-07 DIAGNOSIS — I509 Heart failure, unspecified: Secondary | ICD-10-CM | POA: Diagnosis not present

## 2016-10-07 DIAGNOSIS — Z471 Aftercare following joint replacement surgery: Secondary | ICD-10-CM | POA: Diagnosis not present

## 2016-10-07 DIAGNOSIS — E1142 Type 2 diabetes mellitus with diabetic polyneuropathy: Secondary | ICD-10-CM | POA: Diagnosis not present

## 2016-10-10 DIAGNOSIS — Z471 Aftercare following joint replacement surgery: Secondary | ICD-10-CM | POA: Diagnosis not present

## 2016-10-10 DIAGNOSIS — I509 Heart failure, unspecified: Secondary | ICD-10-CM | POA: Diagnosis not present

## 2016-10-10 DIAGNOSIS — I110 Hypertensive heart disease with heart failure: Secondary | ICD-10-CM | POA: Diagnosis not present

## 2016-10-10 DIAGNOSIS — G8929 Other chronic pain: Secondary | ICD-10-CM | POA: Diagnosis not present

## 2016-10-10 DIAGNOSIS — J449 Chronic obstructive pulmonary disease, unspecified: Secondary | ICD-10-CM | POA: Diagnosis not present

## 2016-10-10 DIAGNOSIS — E1142 Type 2 diabetes mellitus with diabetic polyneuropathy: Secondary | ICD-10-CM | POA: Diagnosis not present

## 2016-10-11 DIAGNOSIS — I509 Heart failure, unspecified: Secondary | ICD-10-CM | POA: Diagnosis not present

## 2016-10-11 DIAGNOSIS — E1142 Type 2 diabetes mellitus with diabetic polyneuropathy: Secondary | ICD-10-CM | POA: Diagnosis not present

## 2016-10-11 DIAGNOSIS — I110 Hypertensive heart disease with heart failure: Secondary | ICD-10-CM | POA: Diagnosis not present

## 2016-10-11 DIAGNOSIS — G8929 Other chronic pain: Secondary | ICD-10-CM | POA: Diagnosis not present

## 2016-10-11 DIAGNOSIS — Z471 Aftercare following joint replacement surgery: Secondary | ICD-10-CM | POA: Diagnosis not present

## 2016-10-11 DIAGNOSIS — J449 Chronic obstructive pulmonary disease, unspecified: Secondary | ICD-10-CM | POA: Diagnosis not present

## 2016-10-12 DIAGNOSIS — J449 Chronic obstructive pulmonary disease, unspecified: Secondary | ICD-10-CM | POA: Diagnosis not present

## 2016-10-12 DIAGNOSIS — E1142 Type 2 diabetes mellitus with diabetic polyneuropathy: Secondary | ICD-10-CM | POA: Diagnosis not present

## 2016-10-12 DIAGNOSIS — I110 Hypertensive heart disease with heart failure: Secondary | ICD-10-CM | POA: Diagnosis not present

## 2016-10-12 DIAGNOSIS — Z471 Aftercare following joint replacement surgery: Secondary | ICD-10-CM | POA: Diagnosis not present

## 2016-10-12 DIAGNOSIS — G8929 Other chronic pain: Secondary | ICD-10-CM | POA: Diagnosis not present

## 2016-10-12 DIAGNOSIS — I509 Heart failure, unspecified: Secondary | ICD-10-CM | POA: Diagnosis not present

## 2016-10-12 NOTE — Discharge Summary (Signed)
Physician Discharge Summary   Patient ID: [name] MRN: [id number] DOB/AGE: 07/23/1950 66 y.o.  Admit date: 09/30/2016 Discharge date: 10/06/2016  Admission Diagnoses:  Active Problems:   History of total knee replacement, left   Discharge Diagnoses:  Same   Surgeries: Procedure(s): LEFT TOTAL KNEE ARTHROPLASTY on 09/30/2016   Consultants: PT/OT  Discharged Condition: Stable  Hospital Course: [name] is an 66 y.o. female who was admitted 09/30/2016 with a chief complaint of left knee pain, and found to have a diagnosis of left knee end stage osteoarthritis.  They were brought to the operating room on 09/30/2016 and underwent the above named procedures.  She was slow with therapy and required several extra days to ensure her safety at home at time of discharge.  The patient had an uncomplicated hospital course and was stable for discharge.  Recent vital signs:  Vitals:   10/06/16 0529 10/06/16 0834  BP: (!) 113/58 113/65  Pulse: 71 74  Resp: 18   Temp: 98.2 F (36.8 C)     Recent laboratory studies:  Results for orders placed or performed during the hospital encounter of 09/30/16  Glucose, capillary  Result Value Ref Range   Glucose-Capillary 127 (H) 65 - 99 mg/dL   Comment 1 Notify RN    Comment 2 Document in Chart   Glucose, capillary  Result Value Ref Range   Glucose-Capillary 106 (H) 65 - 99 mg/dL  CBC  Result Value Ref Range   WBC 10.4 4.0 - 10.5 K/uL   RBC 3.63 (L) 3.87 - 5.11 MIL/uL   Hemoglobin 11.1 (L) 12.0 - 15.0 g/dL   HCT 35.0 (L) 36.0 - 46.0 %   MCV 96.4 78.0 - 100.0 fL   MCH 30.6 26.0 - 34.0 pg   MCHC 31.7 30.0 - 36.0 g/dL   RDW 15.0 11.5 - 15.5 %   Platelets 111 (L) 150 - 400 K/uL  Basic metabolic panel  Result Value Ref Range   Sodium 137 135 - 145 mmol/L   Potassium 4.1 3.5 - 5.1 mmol/L   Chloride 108 101 - 111 mmol/L   CO2 21 (L) 22 - 32 mmol/L   Glucose, Bld 116 (H) 65 - 99 mg/dL   BUN 12 6 - 20 mg/dL   Creatinine, Ser 1.31  (H) 0.44 - 1.00 mg/dL   Calcium 8.4 (L) 8.9 - 10.3 mg/dL   GFR calc non Af Amer 42 (L) >60 mL/min   GFR calc Af Amer 49 (L) >60 mL/min   Anion gap 8 5 - 15  Glucose, capillary  Result Value Ref Range   Glucose-Capillary 87 65 - 99 mg/dL  Glucose, capillary  Result Value Ref Range   Glucose-Capillary 114 (H) 65 - 99 mg/dL  Glucose, capillary  Result Value Ref Range   Glucose-Capillary 117 (H) 65 - 99 mg/dL  Glucose, capillary  Result Value Ref Range   Glucose-Capillary 150 (H) 65 - 99 mg/dL  Glucose, capillary  Result Value Ref Range   Glucose-Capillary 145 (H) 65 - 99 mg/dL  CBC  Result Value Ref Range   WBC 9.4 4.0 - 10.5 K/uL   RBC 3.68 (L) 3.87 - 5.11 MIL/uL   Hemoglobin 11.2 (L) 12.0 - 15.0 g/dL   HCT 35.8 (L) 36.0 - 46.0 %   MCV 97.3 78.0 - 100.0 fL   MCH 30.4 26.0 - 34.0 pg   MCHC 31.3 30.0 - 36.0 g/dL   RDW 15.0 11.5 - 15.5 %   Platelets 104 (L) 150 -  400 K/uL  Glucose, capillary  Result Value Ref Range   Glucose-Capillary 90 65 - 99 mg/dL   Comment 1 Document in Chart   Glucose, capillary  Result Value Ref Range   Glucose-Capillary 88 65 - 99 mg/dL   Comment 1 Document in Chart   Glucose, capillary  Result Value Ref Range   Glucose-Capillary 185 (H) 65 - 99 mg/dL  Glucose, capillary  Result Value Ref Range   Glucose-Capillary 74 65 - 99 mg/dL  CBC  Result Value Ref Range   WBC 8.9 4.0 - 10.5 K/uL   RBC 3.35 (L) 3.87 - 5.11 MIL/uL   Hemoglobin 10.0 (L) 12.0 - 15.0 g/dL   HCT 32.1 (L) 36.0 - 46.0 %   MCV 95.8 78.0 - 100.0 fL   MCH 29.9 26.0 - 34.0 pg   MCHC 31.2 30.0 - 36.0 g/dL   RDW 14.6 11.5 - 15.5 %   Platelets 107 (L) 150 - 400 K/uL  Glucose, capillary  Result Value Ref Range   Glucose-Capillary 119 (H) 65 - 99 mg/dL  Glucose, capillary  Result Value Ref Range   Glucose-Capillary 121 (H) 65 - 99 mg/dL  Glucose, capillary  Result Value Ref Range   Glucose-Capillary 166 (H) 65 - 99 mg/dL   Comment 1 Document in Chart   Glucose, capillary    Result Value Ref Range   Glucose-Capillary 115 (H) 65 - 99 mg/dL   Comment 1 Notify RN   Glucose, capillary  Result Value Ref Range   Glucose-Capillary 163 (H) 65 - 99 mg/dL  Glucose, capillary  Result Value Ref Range   Glucose-Capillary 67 65 - 99 mg/dL  Glucose, capillary  Result Value Ref Range   Glucose-Capillary 85 65 - 99 mg/dL  Glucose, capillary  Result Value Ref Range   Glucose-Capillary 122 (H) 65 - 99 mg/dL  Glucose, capillary  Result Value Ref Range   Glucose-Capillary 105 (H) 65 - 99 mg/dL  Glucose, capillary  Result Value Ref Range   Glucose-Capillary 72 65 - 99 mg/dL   Comment 1 Notify RN    Comment 2 Document in Chart   Glucose, capillary  Result Value Ref Range   Glucose-Capillary 50 (L) 65 - 99 mg/dL   Comment 1 Notify RN    Comment 2 Document in Chart   Glucose, capillary  Result Value Ref Range   Glucose-Capillary 145 (H) 65 - 99 mg/dL  Glucose, capillary  Result Value Ref Range   Glucose-Capillary 104 (H) 65 - 99 mg/dL  Glucose, capillary  Result Value Ref Range   Glucose-Capillary 70 65 - 99 mg/dL  Glucose, capillary  Result Value Ref Range   Glucose-Capillary 96 65 - 99 mg/dL  Glucose, capillary  Result Value Ref Range   Glucose-Capillary 85 65 - 99 mg/dL  Glucose, capillary  Result Value Ref Range   Glucose-Capillary 92 65 - 99 mg/dL  Glucose, capillary  Result Value Ref Range   Glucose-Capillary 219 (H) 65 - 99 mg/dL    Discharge Medications:   Allergies as of 10/06/2016      Reactions   Flexeril [cyclobenzaprine] Other (See Comments)   Prolonged QTc to 571, tachycardia   Soma [carisoprodol] Itching, Rash      Medication List    STOP taking these medications   amoxicillin-clavulanate 875-125 MG tablet Commonly known as:  AUGMENTIN     TAKE these medications   albuterol 108 (90 Base) MCG/ACT inhaler Commonly known as:  PROVENTIL HFA;VENTOLIN HFA Inhale 1-2 puffs into  the lungs every 6 (six) hours as needed for wheezing or  shortness of breath.   amLODipine 5 MG tablet Commonly known as:  NORVASC Take 1 tablet (5 mg total) by mouth daily. What changed:  when to take this   aspirin 81 MG tablet Take 81 mg by mouth every morning.   aspirin-acetaminophen-caffeine 250-250-65 MG tablet Commonly known as:  EXCEDRIN MIGRAINE Take 2 tablets by mouth every 8 (eight) hours as needed for headache.   cephALEXin 500 MG capsule Commonly known as:  KEFLEX Take 1 capsule (500 mg total) by mouth 3 (three) times daily.   fluticasone 50 MCG/ACT nasal spray Commonly known as:  FLONASE Place 2 sprays into both nostrils daily.   gabapentin 600 MG tablet Commonly known as:  NEURONTIN take 2 tablets by mouth three times a day   glipiZIDE 10 MG 24 hr tablet Commonly known as:  GLUCOTROL XL take 1 tablet by mouth once daily WITH SUPPER   glucose blood test strip Commonly known as:  ONETOUCH VERIO Check blood sugar 3x/day   lisinopril 40 MG tablet Commonly known as:  PRINIVIL,ZESTRIL take 1 tablet by mouth once daily   metFORMIN 750 MG 24 hr tablet Commonly known as:  GLUCOPHAGE-XR take 2 tablets by mouth once daily WITH BREAKFAST   methocarbamol 500 MG tablet Commonly known as:  ROBAXIN Take 1 tablet (500 mg total) by mouth every 6 (six) hours as needed for muscle spasms. What changed:  medication strength  how much to take  how to take this  when to take this  reasons to take this  additional instructions   ONETOUCH DELICA LANCETS FINE Misc Check blood sugar up to 3 times a day   ONETOUCH VERIO w/Device Kit 1 each by Does not apply route 3 (three) times daily.   oxyCODONE 5 MG immediate release tablet Commonly known as:  Oxy IR/ROXICODONE Take 1-2 tablets (5-10 mg total) by mouth every 3 (three) hours as needed for severe pain or breakthrough pain.   pantoprazole 20 MG tablet Commonly known as:  PROTONIX Take 1 tablet (20 mg total) by mouth daily as needed. What changed:  when to take  this   rosuvastatin 40 MG tablet Commonly known as:  CRESTOR take 1 tablet by mouth once daily   sertraline 50 MG tablet Commonly known as:  ZOLOFT Take 1 tablet (50 mg total) by mouth daily.   sitaGLIPtin 100 MG tablet Commonly known as:  JANUVIA Take 1 tablet (100 mg total) by mouth daily. What changed:  when to take this       Diagnostic Studies: No results found.  Disposition: 01-Home or Self Care  Discharge Instructions    Call MD / Call 911    Complete by:  As directed    If you experience chest pain or shortness of breath, CALL 911 and be transported to the hospital emergency room.  If you develope a fever above 101 F, pus (white drainage) or increased drainage or redness at the wound, or calf pain, call your surgeon's office.   Call MD / Call 911    Complete by:  As directed    If you experience chest pain or shortness of breath, CALL 911 and be transported to the hospital emergency room.  If you develope a fever above 101 F, pus (white drainage) or increased drainage or redness at the wound, or calf pain, call your surgeon's office.   Constipation Prevention    Complete by:  As directed  Drink plenty of fluids.  Prune juice may be helpful.  You may use a stool softener, such as Colace (over the counter) 100 mg twice a day.  Use MiraLax (over the counter) for constipation as needed.   Constipation Prevention    Complete by:  As directed    Drink plenty of fluids.  Prune juice may be helpful.  You may use a stool softener, such as Colace (over the counter) 100 mg twice a day.  Use MiraLax (over the counter) for constipation as needed.   Diet - low sodium heart healthy    Complete by:  As directed    Diet - low sodium heart healthy    Complete by:  As directed    Do not put a pillow under the knee. Place it under the heel.    Complete by:  As directed    Driving restrictions    Complete by:  As directed    No driving for 3 weeks   Increase activity slowly as  tolerated    Complete by:  As directed    Increase activity slowly as tolerated    Complete by:  As directed    TED hose    Complete by:  As directed    Use stockings (TED hose) for 4 weeks on both leg(s).  You may remove them at night for sleeping.       Contact information for follow-up providers    NORRIS,STEVEN R, MD. Call in 2 week(s).   Specialty:  Orthopedic Surgery Why:  [phone] Contact information: [address] [place] [address] [place] Follow up.   Specialty:  Home Health Services Why:  Home health PT to call and set up first appointment in 1-2 days Contact information: [address] [place] [address] [phone]        Inc[facility] - [facility] Follow up.   Why:  RW to be delivered to room prior to DC Contact information: [address] [place] [address] [phone]            Contact information for after-discharge care    Destination    [facility] Follow up.   Specialty:  [place] information: [address] [place] [phone]                   Signed: Ventura Bruns 10/12/2016, 8:30 AM

## 2016-10-13 DIAGNOSIS — Z96652 Presence of left artificial knee joint: Secondary | ICD-10-CM | POA: Diagnosis not present

## 2016-10-13 DIAGNOSIS — Z471 Aftercare following joint replacement surgery: Secondary | ICD-10-CM | POA: Diagnosis not present

## 2016-10-14 DIAGNOSIS — I509 Heart failure, unspecified: Secondary | ICD-10-CM | POA: Diagnosis not present

## 2016-10-14 DIAGNOSIS — G8929 Other chronic pain: Secondary | ICD-10-CM | POA: Diagnosis not present

## 2016-10-14 DIAGNOSIS — J449 Chronic obstructive pulmonary disease, unspecified: Secondary | ICD-10-CM | POA: Diagnosis not present

## 2016-10-14 DIAGNOSIS — Z471 Aftercare following joint replacement surgery: Secondary | ICD-10-CM | POA: Diagnosis not present

## 2016-10-14 DIAGNOSIS — E1142 Type 2 diabetes mellitus with diabetic polyneuropathy: Secondary | ICD-10-CM | POA: Diagnosis not present

## 2016-10-14 DIAGNOSIS — I110 Hypertensive heart disease with heart failure: Secondary | ICD-10-CM | POA: Diagnosis not present

## 2016-10-17 DIAGNOSIS — M25662 Stiffness of left knee, not elsewhere classified: Secondary | ICD-10-CM | POA: Diagnosis not present

## 2016-10-23 ENCOUNTER — Other Ambulatory Visit: Payer: Self-pay | Admitting: Internal Medicine

## 2016-10-23 DIAGNOSIS — E1142 Type 2 diabetes mellitus with diabetic polyneuropathy: Secondary | ICD-10-CM

## 2016-10-24 NOTE — Telephone Encounter (Signed)
As this patient was seen by me and deemed necessary for their treatment, I will refill this prescription for gabapentin.  

## 2016-10-26 DIAGNOSIS — M1712 Unilateral primary osteoarthritis, left knee: Secondary | ICD-10-CM | POA: Diagnosis not present

## 2016-10-28 DIAGNOSIS — M1712 Unilateral primary osteoarthritis, left knee: Secondary | ICD-10-CM | POA: Diagnosis not present

## 2016-11-04 DIAGNOSIS — M1712 Unilateral primary osteoarthritis, left knee: Secondary | ICD-10-CM | POA: Diagnosis not present

## 2016-11-10 DIAGNOSIS — Z471 Aftercare following joint replacement surgery: Secondary | ICD-10-CM | POA: Diagnosis not present

## 2016-11-10 DIAGNOSIS — M1712 Unilateral primary osteoarthritis, left knee: Secondary | ICD-10-CM | POA: Diagnosis not present

## 2016-11-10 DIAGNOSIS — Z96652 Presence of left artificial knee joint: Secondary | ICD-10-CM | POA: Diagnosis not present

## 2016-11-13 ENCOUNTER — Other Ambulatory Visit: Payer: Self-pay | Admitting: Internal Medicine

## 2016-11-14 ENCOUNTER — Encounter: Payer: Self-pay | Admitting: *Deleted

## 2016-11-14 NOTE — Telephone Encounter (Signed)
As this patient was seen by me and deemed necessary for their treatment, I will refill this prescription for fluticasone nasal spray.

## 2016-11-25 ENCOUNTER — Ambulatory Visit (INDEPENDENT_AMBULATORY_CARE_PROVIDER_SITE_OTHER): Payer: Medicare Other | Admitting: Podiatry

## 2016-11-25 ENCOUNTER — Encounter: Payer: Self-pay | Admitting: Podiatry

## 2016-11-25 DIAGNOSIS — B351 Tinea unguium: Secondary | ICD-10-CM | POA: Diagnosis not present

## 2016-11-25 DIAGNOSIS — M79674 Pain in right toe(s): Secondary | ICD-10-CM

## 2016-11-25 DIAGNOSIS — L84 Corns and callosities: Secondary | ICD-10-CM

## 2016-11-25 DIAGNOSIS — M79675 Pain in left toe(s): Secondary | ICD-10-CM

## 2016-11-25 DIAGNOSIS — E1149 Type 2 diabetes mellitus with other diabetic neurological complication: Secondary | ICD-10-CM

## 2016-11-25 NOTE — Progress Notes (Signed)
Subjective: 66 y.o. returns the office today for painful, elongated, thickened toenails which she cannot trim herself. Denies any redness or drainage around the nails. She states the callus the right second toe is doing well. She is requesting new diabetic shoes. She denies any new ulcerations. Denies any acute changes since last appointment and no new complaints today. Denies any systemic complaints such as fevers, chills, nausea, vomiting.   She recently just had a left knee replacement which is doing well.  Objective: AAO 3, NAD DP/PT pulses palpable, CRT less than 3 seconds Protective sensation decreased with Simms Weinstein monofilament Nails hypertrophic, dystrophic, elongated, brittle, discolored 9. There is tenderness overlying the nails 1-5 on the left and 2-5 on the right. There is no surrounding erythema or drainage along the nail sites. Hyperkeratotic lesion present the distal aspect of the right second toe. Upon debridement no underlying ulceration, drainage or any signs of infection. No other open lesions or pre-ulcerative lesions are identified at this time. Previous right hallux amputation Hammertoes are present No other areas of tenderness bilateral lower extremities. No overlying edema, erythema, increased warmth. No pain with calf compression, swelling, warmth, erythema.  Assessment: Patient presents with symptomatic onychomycosis, pre-ulcerative callus right second toe, neuropathy, history of right hallux amputation  Plan: -Treatment options including alternatives, risks, complications were discussed -Nails sharply debrided 9 without complication/bleeding. -Hyperkeratotic lesion sharply debrided 1 without complications or bleeding -This time she is requesting diabetic shoes. I recommend diabetic shoe with the toe filler on the right side area and I will have her follow up with Liliane Channel for molding. -Discussed daily foot inspection. If there are any changes, to call the  office immediately.  -Follow-up in 3 months or sooner if any problems are to arise. In the meantime, encouraged to call the office with any questions, concerns, changes symptoms.  Celesta Gentile, DPM

## 2016-11-28 ENCOUNTER — Telehealth: Payer: Self-pay

## 2016-11-28 NOTE — Telephone Encounter (Signed)
Thank you for the insight. I am not surprised as she is on so many medication as it is.

## 2016-11-28 NOTE — Telephone Encounter (Signed)
Fax from [facility]: based on your patient's prescription refill history over the past 6 months of their SSRI or SNRI, we are concerned that they may be non- compliant to the prescribed dosing regimen and are under utilizing this medication, Please consider discussing the importance of medication compliance with your patient. This can help identify and resolve potential barriers to medication compliance and lead to improved therapeutic outcomes.

## 2016-12-19 ENCOUNTER — Other Ambulatory Visit: Payer: Self-pay | Admitting: Internal Medicine

## 2016-12-19 DIAGNOSIS — F32A Depression, unspecified: Secondary | ICD-10-CM

## 2016-12-19 DIAGNOSIS — F320 Major depressive disorder, single episode, mild: Secondary | ICD-10-CM

## 2016-12-21 DIAGNOSIS — Z96652 Presence of left artificial knee joint: Secondary | ICD-10-CM | POA: Diagnosis not present

## 2016-12-21 DIAGNOSIS — Z471 Aftercare following joint replacement surgery: Secondary | ICD-10-CM | POA: Diagnosis not present

## 2016-12-21 DIAGNOSIS — M25662 Stiffness of left knee, not elsewhere classified: Secondary | ICD-10-CM | POA: Diagnosis not present

## 2016-12-21 DIAGNOSIS — M1712 Unilateral primary osteoarthritis, left knee: Secondary | ICD-10-CM | POA: Diagnosis not present

## 2016-12-26 ENCOUNTER — Ambulatory Visit (INDEPENDENT_AMBULATORY_CARE_PROVIDER_SITE_OTHER): Payer: Self-pay | Admitting: Podiatry

## 2016-12-26 DIAGNOSIS — M216X1 Other acquired deformities of right foot: Secondary | ICD-10-CM

## 2016-12-26 DIAGNOSIS — L84 Corns and callosities: Secondary | ICD-10-CM

## 2016-12-26 DIAGNOSIS — E1149 Type 2 diabetes mellitus with other diabetic neurological complication: Secondary | ICD-10-CM

## 2016-12-26 DIAGNOSIS — L97511 Non-pressure chronic ulcer of other part of right foot limited to breakdown of skin: Secondary | ICD-10-CM

## 2016-12-26 NOTE — Progress Notes (Signed)
Patient ID: [name], female   DOB: [birth year], 66 y.o.   MRN: [id number]  Patient presents to be measured and molded for diabetic shoes and inserts. Patient measures 8.5 on the [place] device pt requests a wide for comfort pt chose the Apex T2000 Stretchable in black.  Foam mold marked for right 1st toe filler.  Documentation will be entered into [employer] for [employer] DME paperwork.  We will call patient when shoes and inserts arrive.

## 2016-12-29 DIAGNOSIS — M25462 Effusion, left knee: Secondary | ICD-10-CM | POA: Diagnosis not present

## 2016-12-29 DIAGNOSIS — M25562 Pain in left knee: Secondary | ICD-10-CM | POA: Diagnosis not present

## 2017-01-05 DIAGNOSIS — M25462 Effusion, left knee: Secondary | ICD-10-CM | POA: Diagnosis not present

## 2017-01-13 DIAGNOSIS — M25562 Pain in left knee: Secondary | ICD-10-CM | POA: Diagnosis not present

## 2017-01-16 ENCOUNTER — Ambulatory Visit: Payer: Medicare Other

## 2017-01-16 ENCOUNTER — Ambulatory Visit (INDEPENDENT_AMBULATORY_CARE_PROVIDER_SITE_OTHER): Payer: Medicare Other

## 2017-01-16 ENCOUNTER — Encounter: Payer: Self-pay | Admitting: Podiatry

## 2017-01-16 ENCOUNTER — Ambulatory Visit (INDEPENDENT_AMBULATORY_CARE_PROVIDER_SITE_OTHER): Payer: Medicare Other | Admitting: Podiatry

## 2017-01-16 DIAGNOSIS — M79674 Pain in right toe(s): Secondary | ICD-10-CM

## 2017-01-16 DIAGNOSIS — B351 Tinea unguium: Secondary | ICD-10-CM | POA: Diagnosis not present

## 2017-01-16 DIAGNOSIS — M79675 Pain in left toe(s): Secondary | ICD-10-CM | POA: Diagnosis not present

## 2017-01-16 DIAGNOSIS — Q828 Other specified congenital malformations of skin: Secondary | ICD-10-CM | POA: Diagnosis not present

## 2017-01-16 DIAGNOSIS — E1149 Type 2 diabetes mellitus with other diabetic neurological complication: Secondary | ICD-10-CM

## 2017-01-16 DIAGNOSIS — S99921A Unspecified injury of right foot, initial encounter: Secondary | ICD-10-CM | POA: Diagnosis not present

## 2017-01-16 DIAGNOSIS — R609 Edema, unspecified: Secondary | ICD-10-CM

## 2017-01-16 DIAGNOSIS — L84 Corns and callosities: Secondary | ICD-10-CM

## 2017-01-16 DIAGNOSIS — M79676 Pain in unspecified toe(s): Secondary | ICD-10-CM

## 2017-01-16 MED ORDER — CAPSAICIN 0.025 % EX CREA: TOPICAL_CREAM | Freq: Two times a day (BID) | CUTANEOUS | 0 refills | Status: DC

## 2017-01-16 NOTE — Patient Instructions (Signed)
Wear surgical shoe on the right foot I sent over a cream to your pharmacy that you can put on as needed to the foot to try to help with the pain Follow-up in 3 weeks if still any issues with the right foot. Otherwise, I will see you back in 3 months for routine care.

## 2017-01-17 NOTE — Progress Notes (Signed)
Subjective: 66 y.o. returns the office today for painful, elongated, thickened toenails which she cannot trim herself. Denies any redness or drainage around the nails. She states the callus the right second toe is doing well but stating to get thick again. Chest states that she fell couple weeks ago and since that his right foot is been swollen. Denies any significant pain. Denies any acute changes since last appointment and no new complaints today. Denies any systemic complaints such as fevers, chills, nausea, vomiting.   Objective: AAO 3, NAD DP/PT pulses palpable, CRT less than 3 seconds Protective sensation decreased with Simms Weinstein monofilament Nails hypertrophic, dystrophic, elongated, brittle, discolored 9. There is tenderness overlying the nails 1-5 on the left and 2-5 on the right. There is no surrounding erythema or drainage along the nail sites. Hyperkeratotic lesion present the distal aspect of the right second toe. Upon debridement no underlying ulceration, drainage or any signs of infection. No other open lesions or pre-ulcerative lesions are identified at this time. Previous right hallux amputation Hammertoes are present Mild swelling to the dorsal aspect of the right foot without any erythema or increase in warmth. There is no area pinpoint tenderness or pain the vibratory sensation. No other areas of tenderness bilateral lower extremities. No overlying edema, erythema, increased warmth. No pain with calf compression, swelling, warmth, erythema.  Assessment: Patient presents with symptomatic onychomycosis, pre-ulcerative callus right second toe, neuropathy, history of right hallux amputation; right foot injury  Plan: -Treatment options including alternatives, risks, complications were discussed -X-rays were obtained and reviewed. The definitive evidence of acute fracture have there does appear to be some possible changes to the midfoot. Because of this I recommended return  to the surgical shoe which showed he hasn't home. Elevation limited activity. Recommended 2 week follow-up. -Nails sharply debrided 9 without complication/bleeding. -Hyperkeratotic lesion sharply debrided 1 without complications or bleeding -She paperwork was canceled due to not having the signature from her primary care physician. She has an upcoming appointment this week. When she has her appointment with her new doctor we will again complete paperwork and sent to the new position. Order capsaicin cream 2 to pain and neuropathy that she states that she has been getting at nighttime. She is at high doses of gabapentin already -Discussed daily foot inspection. If there are any changes, to call the office immediately.  -Follow-up in 3 months or sooner if any problems are to arise. In the meantime, encouraged to call the office with any questions, concerns, changes symptoms.  Celesta Gentile, DPM

## 2017-01-18 ENCOUNTER — Other Ambulatory Visit: Payer: Self-pay

## 2017-01-18 DIAGNOSIS — E1142 Type 2 diabetes mellitus with diabetic polyneuropathy: Secondary | ICD-10-CM

## 2017-01-18 MED ORDER — GABAPENTIN 600 MG PO TABS: 1200.0000 mg | ORAL_TABLET | Freq: Three times a day (TID) | ORAL | 3 refills | Status: DC

## 2017-01-18 NOTE — Telephone Encounter (Signed)
gabapentin (NEURONTIN) 600 MG tablet, refill request.

## 2017-01-20 ENCOUNTER — Ambulatory Visit (INDEPENDENT_AMBULATORY_CARE_PROVIDER_SITE_OTHER): Payer: Medicare Other | Admitting: Internal Medicine

## 2017-01-20 ENCOUNTER — Encounter: Payer: Self-pay | Admitting: Internal Medicine

## 2017-01-20 VITALS — BP 114/60 | HR 76 | Temp 98.5°F | Ht 66.0 in | Wt 183.2 lb

## 2017-01-20 DIAGNOSIS — R21 Rash and other nonspecific skin eruption: Secondary | ICD-10-CM

## 2017-01-20 DIAGNOSIS — E11649 Type 2 diabetes mellitus with hypoglycemia without coma: Secondary | ICD-10-CM

## 2017-01-20 DIAGNOSIS — R350 Frequency of micturition: Secondary | ICD-10-CM

## 2017-01-20 DIAGNOSIS — E1165 Type 2 diabetes mellitus with hyperglycemia: Secondary | ICD-10-CM

## 2017-01-20 DIAGNOSIS — IMO0002 Reserved for concepts with insufficient information to code with codable children: Secondary | ICD-10-CM

## 2017-01-20 DIAGNOSIS — Z0000 Encounter for general adult medical examination without abnormal findings: Secondary | ICD-10-CM

## 2017-01-20 DIAGNOSIS — E118 Type 2 diabetes mellitus with unspecified complications: Secondary | ICD-10-CM

## 2017-01-20 DIAGNOSIS — F17210 Nicotine dependence, cigarettes, uncomplicated: Secondary | ICD-10-CM

## 2017-01-20 DIAGNOSIS — Z79899 Other long term (current) drug therapy: Secondary | ICD-10-CM | POA: Diagnosis not present

## 2017-01-20 DIAGNOSIS — E1140 Type 2 diabetes mellitus with diabetic neuropathy, unspecified: Secondary | ICD-10-CM

## 2017-01-20 DIAGNOSIS — I10 Essential (primary) hypertension: Secondary | ICD-10-CM

## 2017-01-20 DIAGNOSIS — Z7984 Long term (current) use of oral hypoglycemic drugs: Secondary | ICD-10-CM | POA: Diagnosis not present

## 2017-01-20 DIAGNOSIS — E1142 Type 2 diabetes mellitus with diabetic polyneuropathy: Secondary | ICD-10-CM

## 2017-01-20 DIAGNOSIS — S92901D Unspecified fracture of right foot, subsequent encounter for fracture with routine healing: Secondary | ICD-10-CM | POA: Diagnosis not present

## 2017-01-20 DIAGNOSIS — W231XXD Caught, crushed, jammed, or pinched between stationary objects, subsequent encounter: Secondary | ICD-10-CM | POA: Diagnosis not present

## 2017-01-20 DIAGNOSIS — R351 Nocturia: Secondary | ICD-10-CM

## 2017-01-20 DIAGNOSIS — S60424A Blister (nonthermal) of right ring finger, initial encounter: Secondary | ICD-10-CM

## 2017-01-20 LAB — GLUCOSE, CAPILLARY
Glucose-Capillary: 66 mg/dL (ref 65–99)
Glucose-Capillary: 104 mg/dL — ABNORMAL HIGH (ref 65–99)

## 2017-01-20 LAB — POCT GLYCOSYLATED HEMOGLOBIN (HGB A1C): Hemoglobin A1C: 6.6

## 2017-01-20 MED ORDER — GLIPIZIDE ER 5 MG PO TB24: 5.0000 mg | ORAL_TABLET | Freq: Every day | ORAL | 0 refills | Status: DC

## 2017-01-20 NOTE — Patient Instructions (Signed)
Thank you for allowing me to be a part of your care. It was a pleasure to meet you and I look forward to working with you. Congratulations on your continuing to decrease your A1C. Please make sure to continue using your medication as instructed and monitoring your blood glucose. The following are the changes we made during this visit:  1) Decreased Glipizide to 5mg  daily 2) Decreased Amlodipine dose to 2.5mg . You can continue using the half tablet 3) Referral to Gastroenterology for colonoscopy 4) Evaluated you so that you can get Diabetic shoes, Please use your surgical shoe due to your recent fracture in right foot per podiatry recommendation 5) Use warm compresses for the blister on your left hand   If you have any questions in the meantime. Please do not hesitate to contact me.   Best Wishes,  Dr. Maricela Bo

## 2017-01-20 NOTE — Progress Notes (Signed)
   CC: Right foot swelling  HPI:  [name] is a 66 y.o. female htn, dm, with pmh of who presented for follow up appointment and to discuss about her right foot swelling. She states that the swelling started a few weeks ago when her foot got stuck under a plaque. She was seen by podiatry for this and instructed to use surgical shoes.   The patient mentions that her blood glucose is under good control ranging 120-150, but occasionally she has had low readings (28). The patient denies any vision changes, numbness, dizziness, polyuria or polydipsia. She has noted nocturia. The patient states that she has decreased her amlodipine dosage to 2.5mg  because she feels unwell at a high dose.   The patient also notes a blister on her left hand 4th digit. The blister is 1cm in size, solid, nontender, and round.    Past Medical History:  Diagnosis Date  . Anemia   . Arthritis    "left knee" (09/08/2015)  . Asthma   . Brachial plexus disorders   . CHF (congestive heart failure) ([place])   . Chronic pain    neck pain, headache, neuropathy  . COPD (chronic obstructive pulmonary disease) ([place])    SEES ONLY DR. PATEL   . Depression    "when my husband passed in 2013"  . GERD (gastroesophageal reflux disease)   . Hypertension   . Hypertriglyceridemia   . Migraine    "monthly" (09/08/2015)  . Neuromuscular disorder ([place])   . Puncture wound of foot, right 05/17/2012   Tetanus shot 3 yrs ago at [facility] in [place], per pt report   . Stroke ([place])    [facility]    IN [place]   4 YRS AGO   . Type II diabetes mellitus ([place]) 2010   diagnosed around 2010, only ever on metformin   Review of Systems:    Review of Systems  Constitutional: Positive for weight loss.  Respiratory: Negative for shortness of breath.   Cardiovascular: Negative for chest pain.  Genitourinary: Positive for frequency (nocturia). Negative for dysuria and hematuria.   Physical Exam:  Vitals:   01/20/17 1333  BP: 114/60    Pulse: 76  Temp: 98.5 F (36.9 C)  TempSrc: Oral  SpO2: 99%  Weight: 183 lb 3.2 oz (83.1 kg)  Height: 5\' 6"  (1.676 m)   Physical Exam  Constitutional: She appears well-developed and well-nourished. No distress.  HENT:  Head: Normocephalic and atraumatic.  Eyes: Conjunctivae are normal.  Cardiovascular: Normal rate, regular rhythm, normal heart sounds and intact distal pulses.   No murmur heard. Pulmonary/Chest: Effort normal. She has wheezes (throughout lung fields ).  Abdominal: Soft. Bowel sounds are normal. There is no tenderness.  Neurological:  Foot exam showed intact dp pulses bilaterally  128hz  turning fork test- pt was not able to appreciate vibration on her right foot, however she was able to on her left.   Skin: Rash (mid forehead erythematous rash ) noted.    Assessment & Plan:   See Encounters Tab for problem based charting.  Patient seen with Dr. Daryll Drown

## 2017-01-20 NOTE — Progress Notes (Signed)
Hypoglycemic Event  CBG: 66  Treatment: 4 oz of OJ given.  Symptoms: Asymptomatic.  Follow-up CBG: Time: 1410 PM CBG Result:104 Possible Reasons forEvent: Stated she had cereal and potato chips for breakfast. And she usually doesn't eat during the day.  Comments/MD notified: Yes   Ebbie Latus

## 2017-01-20 NOTE — Assessment & Plan Note (Addendum)
The pt's A1C is 6.6 today 8/10 and random blood glucose is 66. Post being given orange juice the patient's glucose rose to 104. The patient states that she has not been eating regularly recently. She states that she is compliant with her medication. The patient did not bring in her glucose monitor, but states that her random glucose ranges 121-148. She did note an isolated blood sugar reading of 28. She denies polyuria or polydipsia, but has noted nocturia. The patient has gained 3lbs since her last visit 4 months ago.    -Decreased Glipizide to 5mg  qd -Continue podiatry appointments q56months

## 2017-01-21 DIAGNOSIS — Z1239 Encounter for other screening for malignant neoplasm of breast: Secondary | ICD-10-CM | POA: Insufficient documentation

## 2017-01-21 DIAGNOSIS — S60424A Blister (nonthermal) of right ring finger, initial encounter: Secondary | ICD-10-CM | POA: Insufficient documentation

## 2017-01-21 LAB — CBC
HEMATOCRIT: 39.9 % (ref 34.0–46.6)
HEMOGLOBIN: 12.6 g/dL (ref 11.1–15.9)
MCH: 29.9 pg (ref 26.6–33.0)
MCHC: 31.6 g/dL (ref 31.5–35.7)
MCV: 95 fL (ref 79–97)
Platelets: 179 10*3/uL (ref 150–379)
RBC: 4.21 x10E6/uL (ref 3.77–5.28)
RDW: 14.3 % (ref 12.3–15.4)
WBC: 8.4 10*3/uL (ref 3.4–10.8)

## 2017-01-21 LAB — BMP8+ANION GAP
Anion Gap: 14 mmol/L (ref 10.0–18.0)
BUN/Creatinine Ratio: 11 — ABNORMAL LOW (ref 12–28)
BUN: 11 mg/dL (ref 8–27)
CALCIUM: 9.6 mg/dL (ref 8.7–10.3)
CO2: 21 mmol/L (ref 20–29)
Chloride: 110 mmol/L — ABNORMAL HIGH (ref 96–106)
Creatinine, Ser: 1 mg/dL (ref 0.57–1.00)
GFR, EST AFRICAN AMERICAN: 69 mL/min/{1.73_m2} (ref >59)
GFR, EST NON AFRICAN AMERICAN: 60 mL/min/{1.73_m2} (ref >59)
Glucose: 66 mg/dL (ref 65–99)
Potassium: 4.2 mmol/L (ref 3.5–5.2)
Sodium: 145 mmol/L — ABNORMAL HIGH (ref 134–144)

## 2017-01-21 NOTE — Assessment & Plan Note (Signed)
The patient stated that she has decreased her amlodipine dose to 1/2 of the 5mg  tab (2.5mg ). Since the patient's bp during this visit was well controlled 114/60, I recommended that she continue at that dose.   -Continue amlodipine 2.5mg  qd

## 2017-01-21 NOTE — Assessment & Plan Note (Addendum)
Patient is due for a colonoscopy.  -Referred to GI for colonoscopy  Patient refused pneumococcal vaccine   Patient is a current smoker and has smoked for approximately 50 years. She smokes 2-3 cigarettes daily but she has smoked about 1-1.5ppd for 20 years.

## 2017-01-21 NOTE — Assessment & Plan Note (Signed)
The patient stated that she was stepping on a long plaque to reach something and her foot got stuck underneath. She was seen by Dr. Jacqualyn Posey earlier this week who took x-rays of the right foot. The results and imaging are not accessible on epic, however per Dr. Leigh Aurora note he saw some acute fractures. The patient continues to have decreased sensation on her right foot per tuning fork exam.   -Recommended use of surgical shoe  -Pain management with neurontin 1200mg  tid and capsaicin cream -Evaluated patient for diabetic shoe. Will be prescribed shoe by Dr. Jacqualyn Posey.

## 2017-01-23 ENCOUNTER — Encounter: Payer: Self-pay | Admitting: Internal Medicine

## 2017-01-23 ENCOUNTER — Telehealth: Payer: Self-pay

## 2017-01-23 NOTE — Telephone Encounter (Signed)
tient called stating that she cannot find any of her boots/shoes at home and that her foot is in extreme pain, she wants to know if we can prescribe her a medication to help

## 2017-01-23 NOTE — Telephone Encounter (Signed)
If she is having pain she needs to come in to be seen before giving pain medication.

## 2017-01-23 NOTE — Telephone Encounter (Signed)
Spoke with patient advising her that if she is still having pain that she needa an appt to be re-evaluated by Dr Jacqualyn Posey

## 2017-01-24 ENCOUNTER — Encounter: Payer: Self-pay | Admitting: Internal Medicine

## 2017-01-25 NOTE — Addendum Note (Signed)
Addended by: Gilles Chiquito B on: 01/25/2017 02:58 PM   Modules accepted: Level of Service

## 2017-01-25 NOTE — Progress Notes (Signed)
Internal Medicine Clinic Attending  I saw and evaluated the patient.  I personally confirmed the key portions of the history and exam documented by Dr. Chundi and I reviewed pertinent patient test results.  The assessment, diagnosis, and plan were formulated together and I agree with the documentation in the resident's note. 

## 2017-01-26 ENCOUNTER — Ambulatory Visit (INDEPENDENT_AMBULATORY_CARE_PROVIDER_SITE_OTHER): Payer: Medicare Other | Admitting: Podiatry

## 2017-01-26 ENCOUNTER — Encounter: Payer: Self-pay | Admitting: Podiatry

## 2017-01-26 ENCOUNTER — Ambulatory Visit (INDEPENDENT_AMBULATORY_CARE_PROVIDER_SITE_OTHER): Payer: Medicare Other

## 2017-01-26 DIAGNOSIS — S99921A Unspecified injury of right foot, initial encounter: Secondary | ICD-10-CM

## 2017-01-26 DIAGNOSIS — M84374A Stress fracture, right foot, initial encounter for fracture: Secondary | ICD-10-CM | POA: Diagnosis not present

## 2017-01-26 MED ORDER — TRAMADOL HCL 50 MG PO TABS: 50.0000 mg | ORAL_TABLET | Freq: Two times a day (BID) | ORAL | 0 refills | Status: DC | PRN

## 2017-01-27 NOTE — Progress Notes (Signed)
Subjective: [name] presents the op city for concerns of right foot pain and swelling. She states that she's been trying to get in the pool and going up the ladder to get into the portal causes the majority of symptoms. She denies any specific injury or trauma. She has not been wearing surgical shoe as she states she cannot find a but she thought she had one at her house. Denies any systemic complaints such as fevers, chills, nausea, vomiting. No acute changes since last appointment, and no other complaints at this time.   ROS: All other systems are negative today.   Objective: AAO x3, NAD DP/PT pulses palpable bilaterally, CRT less than 3 seconds Sensation decreased with SWMF. Incisions from prior surgeries well healed with a scar.  There is continuation of tenderness along the distal aspect of the right foot and there is tenderness on the second third metatarsal diaphysis. There is mild edema to the dorsal aspect of the foot there is no erythema or increase in warmth. There is no open lesion identified today. There is no pain with ankle or subtalar joint range of motion. No open lesions or pre-ulcerative lesions.  No pain with calf compression, swelling, warmth, erythema  Assessment: Possible stress fracture right foot  Plan: -All treatment options discussed with the patient including all alternatives, risks, complications.  -X-rays were taken and reviewed. No evidence of definitive acute stress fracture or fracture at this time.  -Given her symptoms I recommended immobilization in a surgical shoe was dispensed today for her. - Ice elevation and also limit activity. -Patient encouraged to call the office with any questions, concerns, change in symptoms.    Celesta Gentile, DPM

## 2017-01-31 DIAGNOSIS — Z471 Aftercare following joint replacement surgery: Secondary | ICD-10-CM | POA: Diagnosis not present

## 2017-01-31 DIAGNOSIS — Z96652 Presence of left artificial knee joint: Secondary | ICD-10-CM | POA: Diagnosis not present

## 2017-02-06 ENCOUNTER — Ambulatory Visit: Payer: Medicare Other | Admitting: Podiatry

## 2017-02-07 ENCOUNTER — Other Ambulatory Visit: Payer: Self-pay | Admitting: *Deleted

## 2017-02-07 ENCOUNTER — Telehealth: Payer: Self-pay | Admitting: Podiatry

## 2017-02-07 DIAGNOSIS — E1165 Type 2 diabetes mellitus with hyperglycemia: Principal | ICD-10-CM

## 2017-02-07 DIAGNOSIS — E1140 Type 2 diabetes mellitus with diabetic neuropathy, unspecified: Secondary | ICD-10-CM

## 2017-02-07 DIAGNOSIS — IMO0002 Reserved for concepts with insufficient information to code with codable children: Secondary | ICD-10-CM

## 2017-02-07 MED ORDER — METFORMIN HCL ER 750 MG PO TB24: ORAL_TABLET | ORAL | 3 refills | Status: DC

## 2017-02-07 NOTE — Telephone Encounter (Signed)
Left message for pt to call reguarding her diabetic shoes.We have faxed the paperwork to Dr Maricela Bo several times and they are not responding.

## 2017-02-08 ENCOUNTER — Other Ambulatory Visit: Payer: Self-pay | Admitting: *Deleted

## 2017-02-08 MED ORDER — ROSUVASTATIN CALCIUM 40 MG PO TABS
40.0000 mg | ORAL_TABLET | Freq: Every day | ORAL | 3 refills | Status: DC
Start: 2017-02-08 — End: 2018-03-14

## 2017-02-08 NOTE — Telephone Encounter (Signed)
One yr supply rx in feb refilled

## 2017-02-09 ENCOUNTER — Ambulatory Visit: Payer: Medicare Other | Admitting: Podiatry

## 2017-02-10 ENCOUNTER — Ambulatory Visit (INDEPENDENT_AMBULATORY_CARE_PROVIDER_SITE_OTHER): Payer: Medicare Other | Admitting: Internal Medicine

## 2017-02-10 VITALS — BP 135/70 | HR 72 | Temp 98.0°F | Ht 66.0 in | Wt 183.9 lb

## 2017-02-10 DIAGNOSIS — IMO0002 Reserved for concepts with insufficient information to code with codable children: Secondary | ICD-10-CM

## 2017-02-10 DIAGNOSIS — E1140 Type 2 diabetes mellitus with diabetic neuropathy, unspecified: Secondary | ICD-10-CM | POA: Diagnosis present

## 2017-02-10 DIAGNOSIS — E1165 Type 2 diabetes mellitus with hyperglycemia: Secondary | ICD-10-CM

## 2017-02-10 DIAGNOSIS — Z7984 Long term (current) use of oral hypoglycemic drugs: Secondary | ICD-10-CM | POA: Diagnosis not present

## 2017-02-10 DIAGNOSIS — K219 Gastro-esophageal reflux disease without esophagitis: Secondary | ICD-10-CM

## 2017-02-10 DIAGNOSIS — E118 Type 2 diabetes mellitus with unspecified complications: Secondary | ICD-10-CM

## 2017-02-10 MED ORDER — GLIPIZIDE ER 5 MG PO TB24: 5.0000 mg | ORAL_TABLET | Freq: Every day | ORAL | 3 refills | Status: DC

## 2017-02-10 MED ORDER — AMLODIPINE BESYLATE 2.5 MG PO TABS: 2.5000 mg | ORAL_TABLET | Freq: Every day | ORAL | 3 refills | Status: DC

## 2017-02-10 MED ORDER — ALBUTEROL SULFATE HFA 108 (90 BASE) MCG/ACT IN AERS: 1.0000 | INHALATION_SPRAY | Freq: Four times a day (QID) | RESPIRATORY_TRACT | 5 refills | Status: AC | PRN

## 2017-02-10 MED ORDER — PANTOPRAZOLE SODIUM 20 MG PO TBEC: 20.0000 mg | DELAYED_RELEASE_TABLET | Freq: Every day | ORAL | 3 refills | Status: DC | PRN

## 2017-02-10 NOTE — Assessment & Plan Note (Signed)
HPI: Was having some hypoglycemia at last visit, glipizde dose was decreased. Reports no further hypoglycemia.   She was concerned today aobut metformin, she heard from friend that metformin increases amputations.  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

## 2017-02-10 NOTE — Progress Notes (Signed)
[facility] Subjective:  HPI: [name] is a 66 y.o. female who presents for follow up of her diabetes.  Please see Assessment and Plan below for the status of her chronic medical problems.  Review of Systems: No dizziness, nausea, fever, chills Positive for burning pain in feet. Objective:  Physical Exam: Vitals:   02/10/17 1048  BP: 135/70  Pulse: 72  Temp: 98 F (36.7 C)  TempSrc: Oral  SpO2: 98%  Weight: 183 lb 14.4 oz (83.4 kg)  Height: 5\' 6"  (1.676 m)  Physical Exam  Cardiovascular: Normal rate and regular rhythm.   Pulmonary/Chest: Effort normal and breath sounds normal.  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  Nursing note and vitals reviewed.   Assessment & Plan:  Esophageal reflux HPI: well controlled with pantoprazole, requests refill  A: GERD  P: Protonix 20mg  daily PRN  Diabetes mellitus type 2, controlled, with complications (HCC) HPI: Was having some hypoglycemia at last visit, glipizde dose was decreased. Reports no further hypoglycemia.   She was concerned today aobut metformin, she heard from friend that metformin increases amputations.  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   Medications Ordered Meds ordered this encounter  Medications  . amLODipine (NORVASC) 2.5 MG tablet    Sig: Take 1 tablet (2.5 mg total) by mouth daily.    Dispense:  90 tablet    Refill:  3  . pantoprazole (PROTONIX) 20 MG tablet    Sig: Take 1 tablet (20 mg total) by mouth daily as needed.    Dispense:  90 tablet    Refill:  3  .  albuterol (PROVENTIL HFA;VENTOLIN HFA) 108 (90 Base) MCG/ACT inhaler    Sig: Inhale 1-2 puffs into the lungs every 6 (six) hours as needed for wheezing or shortness of breath.    Dispense:  1 Inhaler    Refill:  5  . glipiZIDE (GLUCOTROL XL) 5 MG 24 hr tablet    Sig: Take 1 tablet (5 mg total) by mouth daily with breakfast.    Dispense:  90 tablet    Refill:  3   Other Orders No orders of the defined types were placed in this encounter.  Follow Up: Return in about 3 months (around 05/12/2017).

## 2017-02-10 NOTE — Patient Instructions (Signed)
I will look into the paper work for your diabetic shoes.

## 2017-02-10 NOTE — Assessment & Plan Note (Signed)
HPI: well controlled with pantoprazole, requests refill  A: GERD  P: Protonix 20mg  daily PRN

## 2017-02-16 ENCOUNTER — Telehealth: Payer: Self-pay | Admitting: Podiatry

## 2017-02-16 NOTE — Telephone Encounter (Signed)
Internal medicine called saying they faxed the paperwork on 17 August but they are going to fax it again.

## 2017-02-24 ENCOUNTER — Ambulatory Visit (INDEPENDENT_AMBULATORY_CARE_PROVIDER_SITE_OTHER): Payer: Medicare Other | Admitting: Podiatry

## 2017-02-24 DIAGNOSIS — G8929 Other chronic pain: Secondary | ICD-10-CM | POA: Diagnosis not present

## 2017-02-24 DIAGNOSIS — B351 Tinea unguium: Secondary | ICD-10-CM | POA: Diagnosis not present

## 2017-02-24 DIAGNOSIS — E1149 Type 2 diabetes mellitus with other diabetic neurological complication: Secondary | ICD-10-CM

## 2017-02-24 DIAGNOSIS — M79671 Pain in right foot: Secondary | ICD-10-CM

## 2017-02-27 ENCOUNTER — Telehealth: Payer: Self-pay | Admitting: *Deleted

## 2017-02-27 DIAGNOSIS — M79671 Pain in right foot: Principal | ICD-10-CM

## 2017-02-27 DIAGNOSIS — G8929 Other chronic pain: Secondary | ICD-10-CM

## 2017-02-27 NOTE — Telephone Encounter (Deleted)
-----   Message from Trula Slade, DPM sent at 02/27/2017  7:12 AM EDT ----- Can you please order an MRI of the right foot to look for stress fracture or charcot? She has had ongoing right midfoot pain and swelling for > 6 weeks with no improvement, x-rays were negative.

## 2017-02-27 NOTE — Telephone Encounter (Addendum)
-----   Message from Trula Slade, DPM sent at 02/27/2017  7:12 AM EDT ----- Can you please order an MRI of the right foot to look for stress fracture or charcot? She has had ongoing right midfoot pain and swelling for > 6 weeks with no improvement, x-rays were negative. Faxed orders to [facility].

## 2017-02-27 NOTE — Progress Notes (Signed)
Subjective: Ms. Brents presents the office today for concerns of thick, painful, elongated toenails that she cannot trim herself. She states they get irritated inside shoes. She denies any redness or drainage or any swelling. She also continues to get pain to the top of the right foot and his been swollen. She denies any redness or warmth. She is rating the surgical shoe. This has been ongoing at this point for over 6 weeks. Denies any systemic complaints such as fevers, chills, nausea, vomiting. No acute changes since last appointment, and no other complaints at this time.   Objective: AAO x3, NAD DP/PT pulses palpable bilaterally, CRT less than 3 seconds Discontinue tenderness palpation of the dorsal aspect of the right midfoot nurse localized edema to this area but there is no erythema or increase in warmth. There is no significant area pinpoint tenderness however there is diffuse tenderness this area and she does have neuropathy which also scares evaluation. There is no open lesions identified.  Nails are hypertrophic, dystrophic, discolored yellow to brown discoloration. There is no swelling redness or drainage. There is no clinical signs of infection present. Tenderness to nails 1-5 bilaterally. No open lesions or pre-ulcerative lesions.  No pain with calf compression, swelling, warmth, erythema  Assessment: Symptomatic onychomycosis; ongoing right dorsal midfoot pain from metatarsal pain  Plan: -All treatment options discussed with the patient including all alternatives, risks, complications.  -Nails are sharply debrided 9 without any complications or bleeding -At this point given her ongoing pain to the right midfoot as well as swelling I recommended MRI. X-rays previously been negative. This is to evaluate for stress fracture although x-rays were negative, Charcot -Daily foot inspection -RTC 3 months for routine care go call her once the MRI results are obtained. -Patient encouraged  to call the office with any questions, concerns, change in symptoms.   Celesta Gentile, DPM

## 2017-02-27 NOTE — Telephone Encounter (Deleted)
Faxed orders to [facility]. 

## 2017-02-28 ENCOUNTER — Telehealth: Payer: Self-pay | Admitting: Podiatry

## 2017-02-28 NOTE — Telephone Encounter (Signed)
Please have Dr. Leigh Aurora nurse call me if she did not get the fax from internal fax. My call back number is [phone].

## 2017-02-28 NOTE — Telephone Encounter (Deleted)
-----   Message from Trula Slade, DPM sent at 02/27/2017  7:12 AM EDT ----- Can you please order an MRI of the right foot to look for stress fracture or charcot? She has had ongoing right midfoot pain and swelling for > 6 weeks with no improvement, x-rays were negative.

## 2017-03-01 NOTE — Telephone Encounter (Signed)
Spoke with Dawn and was stated that the paperwork was sent to [facility] and was waiting on them and once there is an ok we can get the shoes and I called the patient back and she was ok with that. Lattie Haw

## 2017-03-02 ENCOUNTER — Telehealth: Payer: Self-pay | Admitting: Podiatry

## 2017-03-02 NOTE — Telephone Encounter (Signed)
I would like to get a message to Lattie Haw, Dr. Leigh Aurora nurse regarding my foot. Please call me back at [phone] and I'd like to speak to [facility], Dr. Leigh Aurora nurse. Thank you.

## 2017-03-03 ENCOUNTER — Telehealth: Payer: Self-pay | Admitting: *Deleted

## 2017-03-03 NOTE — Telephone Encounter (Signed)
I have already talked to this patient this week and I will call her again today. [name]

## 2017-03-07 ENCOUNTER — Telehealth: Payer: Self-pay | Admitting: *Deleted

## 2017-03-07 ENCOUNTER — Other Ambulatory Visit: Payer: Self-pay | Admitting: *Deleted

## 2017-03-07 MED ORDER — TRAMADOL HCL 50 MG PO TABS: 50.0000 mg | ORAL_TABLET | Freq: Two times a day (BID) | ORAL | 0 refills | Status: DC | PRN

## 2017-03-07 NOTE — Telephone Encounter (Signed)
Called patient and stated that the tramadol was being called in and the MRI was ordered. [name]

## 2017-03-14 ENCOUNTER — Telehealth: Payer: Self-pay | Admitting: *Deleted

## 2017-03-14 ENCOUNTER — Ambulatory Visit (AMBULATORY_SURGERY_CENTER): Payer: Self-pay | Admitting: *Deleted

## 2017-03-14 VITALS — Ht 66.0 in | Wt 185.0 lb

## 2017-03-14 DIAGNOSIS — Z1211 Encounter for screening for malignant neoplasm of colon: Secondary | ICD-10-CM

## 2017-03-14 MED ORDER — NA SULFATE-K SULFATE-MG SULF 17.5-3.13-1.6 GM/177ML PO SOLN: 1.0000 | Freq: Once | ORAL | 0 refills | Status: AC

## 2017-03-14 NOTE — Telephone Encounter (Signed)
[name],  It appears her delirium was related to oxycodone.  This pt is cleared for anesthetic care at [facility].  Thanks,  Osvaldo Angst

## 2017-03-14 NOTE — Telephone Encounter (Signed)
John,  I saw this lady today in [place]-  She states she is allergic to S-I-^). When we looked this up it states this is Propofol.   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   Is she ok for LEC w/ propofol?  Please advise, Thanks, Marijean Niemann

## 2017-03-14 NOTE — Progress Notes (Signed)
No egg or soy allergy known to patient  No issues with past sedation with any surgeries  or procedures, no intubation problems  No diet pills per patient No home 02 use per patient  No blood thinners per patient  Pt denies issues with constipation  No A fib or A flutter  EMMI video sent to pt's e mail pt declined   

## 2017-03-15 NOTE — Telephone Encounter (Signed)
Notified patient's daughter that she can continue with her appointment as scheduled. Nothing was cancelled and to call me back if she has any questions.

## 2017-03-15 NOTE — Telephone Encounter (Signed)
Patient just called me back. Explained to her that she would be able to have the colonoscopy as planned. She verbalized understanding. No further questions at this time.

## 2017-03-22 ENCOUNTER — Ambulatory Visit
Admission: RE | Admit: 2017-03-22 | Discharge: 2017-03-22 | Disposition: A | Discharge status: Home / Self Care | Payer: Medicare Other | Source: Ambulatory Visit | Attending: Podiatry | Admitting: Podiatry

## 2017-03-22 ENCOUNTER — Other Ambulatory Visit: Payer: Self-pay | Admitting: Student in an Organized Health Care Education/Training Program

## 2017-03-22 ENCOUNTER — Ambulatory Visit (INDEPENDENT_AMBULATORY_CARE_PROVIDER_SITE_OTHER): Payer: Medicare Other | Admitting: Podiatry

## 2017-03-22 DIAGNOSIS — L97511 Non-pressure chronic ulcer of other part of right foot limited to breakdown of skin: Secondary | ICD-10-CM

## 2017-03-22 DIAGNOSIS — L84 Corns and callosities: Secondary | ICD-10-CM

## 2017-03-22 DIAGNOSIS — M216X1 Other acquired deformities of right foot: Secondary | ICD-10-CM

## 2017-03-22 DIAGNOSIS — Z899 Acquired absence of limb, unspecified: Secondary | ICD-10-CM | POA: Diagnosis not present

## 2017-03-22 DIAGNOSIS — E1149 Type 2 diabetes mellitus with other diabetic neurological complication: Secondary | ICD-10-CM

## 2017-03-22 DIAGNOSIS — M19071 Primary osteoarthritis, right ankle and foot: Secondary | ICD-10-CM | POA: Diagnosis not present

## 2017-03-22 NOTE — Telephone Encounter (Signed)
Approved one month refill of Januvia and Lisinopril 40mg  qd, follow up appt is scheduled for 04/21/17.

## 2017-03-23 NOTE — Telephone Encounter (Addendum)
-----   Message from Trula Slade, DPM sent at 03/22/2017  5:01 PM EDT ----- + arthritis on the MRI which corresponds to her pain; please let her know. We need to work on her inserts and we can do a topical anti-inflammatory cream if she would like. Please let her know. She can come in to discuss also. Thanks.03/23/2017-I informed pt of Dr. Leigh Aurora review of results and orders. Pt states she doesn't want the cream, because it did not work. Pt states she got the shoes yesterday and she likes the way they fixed the shoe where the toe is missing, but would like an appt to come in and discuss with Dr. Jacqualyn Posey. I transferred pt to schedulers.

## 2017-03-23 NOTE — Progress Notes (Signed)
Patient presents a pickup of diabetic shoes as well as inserts. He was seen today by Benjie Karvonen, certified pedorthitist to dispense. Fit was appropriate. Monitoring skin breakdown issues and call the office should any occur.

## 2017-03-28 ENCOUNTER — Ambulatory Visit (AMBULATORY_SURGERY_CENTER): Payer: Medicare Other | Admitting: Internal Medicine

## 2017-03-28 ENCOUNTER — Encounter: Payer: Self-pay | Admitting: Internal Medicine

## 2017-03-28 VITALS — BP 104/69 | HR 65 | Temp 97.7°F | Resp 16 | Ht 66.0 in | Wt 185.0 lb

## 2017-03-28 DIAGNOSIS — K219 Gastro-esophageal reflux disease without esophagitis: Secondary | ICD-10-CM | POA: Diagnosis not present

## 2017-03-28 DIAGNOSIS — Z1212 Encounter for screening for malignant neoplasm of rectum: Secondary | ICD-10-CM

## 2017-03-28 DIAGNOSIS — Z1211 Encounter for screening for malignant neoplasm of colon: Secondary | ICD-10-CM | POA: Diagnosis present

## 2017-03-28 DIAGNOSIS — D120 Benign neoplasm of cecum: Secondary | ICD-10-CM

## 2017-03-28 DIAGNOSIS — J45909 Unspecified asthma, uncomplicated: Secondary | ICD-10-CM | POA: Diagnosis not present

## 2017-03-28 DIAGNOSIS — I10 Essential (primary) hypertension: Secondary | ICD-10-CM | POA: Diagnosis not present

## 2017-03-28 DIAGNOSIS — E119 Type 2 diabetes mellitus without complications: Secondary | ICD-10-CM | POA: Diagnosis not present

## 2017-03-28 DIAGNOSIS — Z8673 Personal history of transient ischemic attack (TIA), and cerebral infarction without residual deficits: Secondary | ICD-10-CM | POA: Diagnosis not present

## 2017-03-28 DIAGNOSIS — J449 Chronic obstructive pulmonary disease, unspecified: Secondary | ICD-10-CM | POA: Diagnosis not present

## 2017-03-28 DIAGNOSIS — E669 Obesity, unspecified: Secondary | ICD-10-CM | POA: Diagnosis not present

## 2017-03-28 MED ORDER — SODIUM CHLORIDE 0.9 % IV SOLN: 500.0000 mL | INTRAVENOUS | Status: DC

## 2017-03-28 NOTE — Progress Notes (Signed)
Pt had no complaints noted in the recovery room.  Pt's daughter was called to be the driver of pt and her granddaughter.  maw

## 2017-03-28 NOTE — Op Note (Signed)
[facility] Patient Name: [name] Procedure Date: 03/28/2017 11:32 AM MRN: [id number] Endoscopist: Jerene Bears , MD Age: 66 Referring MD:  Date of Birth: 1951-04-18 Gender: Female Account #: [id number] Procedure:                Colonoscopy Indications:              Screening for colorectal malignant neoplasm, Last                            colonoscopy 10 years ago Medicines:                Monitored Anesthesia Care Procedure:                Pre-Anesthesia Assessment:                           - Prior to the procedure, a History and Physical                            was performed, and patient medications and                            allergies were reviewed. The patient's tolerance of                            previous anesthesia was also reviewed. The risks                            and benefits of the procedure and the sedation                            options and risks were discussed with the patient.                            All questions were answered, and informed consent                            was obtained. Prior Anticoagulants: The patient has                            taken no previous anticoagulant or antiplatelet                            agents. ASA Grade Assessment: III - A patient with                            severe systemic disease. After reviewing the risks                            and benefits, the patient was deemed in                            satisfactory condition to undergo the procedure.  After obtaining informed consent, the colonoscope                            was passed under direct vision. Throughout the                            procedure, the patient's blood pressure, pulse, and                            oxygen saturations were monitored continuously. The                            Colonoscope was introduced through the anus and                            advanced to the the cecum,  identified by                            appendiceal orifice and ileocecal valve. The                            colonoscopy was performed without difficulty. The                            patient tolerated the procedure well. The quality                            of the bowel preparation was good. The ileocecal                            valve, appendiceal orifice, and rectum were                            photographed. Scope In: 11:39:58 AM Scope Out: 85:63:14 AM Scope Withdrawal Time: 0 hours 12 minutes 43 seconds  Total Procedure Duration: 0 hours 16 minutes 16 seconds  Findings:                 The digital rectal exam was normal.                           A 8 mm polyp was found in the cecum. The polyp was                            flat and examined under white light and NBI. The                            polyp was removed with a cold snare. Resection and                            retrieval were complete.                           Internal hemorrhoids were found during  retroflexion. The hemorrhoids were small.                           The exam was otherwise without abnormality. Complications:            No immediate complications. Estimated Blood Loss:     Estimated blood loss was minimal. Impression:               - One 8 mm polyp in the cecum, removed with a cold                            snare. Resected and retrieved.                           - Small internal hemorrhoids.                           - The examination was otherwise normal. Recommendation:           - Patient has a contact number available for                            emergencies. The signs and symptoms of potential                            delayed complications were discussed with the                            patient. Return to normal activities tomorrow.                            Written discharge instructions were provided to the                            patient.                            - Resume previous diet.                           - Continue present medications.                           - Await pathology results.                           - Repeat colonoscopy is recommended. The                            colonoscopy date will be determined after pathology                            results from today's exam become available for                            review. Jerene Bears, MD 03/28/2017 12:00:18 PM This report has been signed electronically.

## 2017-03-28 NOTE — Progress Notes (Signed)
Called to room to assist during endoscopic procedure.  Patient ID and intended procedure confirmed with present staff. Received instructions for my participation in the procedure from the performing physician.  

## 2017-03-28 NOTE — Progress Notes (Signed)
Report to PACU, RN, vss, BBS= Clear.  

## 2017-03-28 NOTE — Patient Instructions (Signed)
   Information on polyps and hemorrhoids given to you today  Await pathology results on polyp removed    YOU HAD AN ENDOSCOPIC PROCEDURE TODAY AT [facility]:   Refer to the procedure report that was given to you for any specific questions about what was found during the examination.  If the procedure report does not answer your questions, please call your gastroenterologist to clarify.  If you requested that your care partner not be given the details of your procedure findings, then the procedure report has been included in a sealed envelope for you to review at your convenience later.  YOU SHOULD EXPECT: Some feelings of bloating in the abdomen. Passage of more gas than usual.  Walking can help get rid of the air that was put into your GI tract during the procedure and reduce the bloating. If you had a lower endoscopy (such as a colonoscopy or flexible sigmoidoscopy) you may notice spotting of blood in your stool or on the toilet paper. If you underwent a bowel prep for your procedure, you may not have a normal bowel movement for a few days.  Please Note:  You might notice some irritation and congestion in your nose or some drainage.  This is from the oxygen used during your procedure.  There is no need for concern and it should clear up in a day or so.  SYMPTOMS TO REPORT IMMEDIATELY:   Following lower endoscopy (colonoscopy or flexible sigmoidoscopy):  Excessive amounts of blood in the stool  Significant tenderness or worsening of abdominal pains  Swelling of the abdomen that is new, acute  Fever of 100F or higher    For urgent or emergent issues, a gastroenterologist can be reached at any hour by calling [phone].   DIET:  We do recommend a small meal at first, but then you may proceed to your regular diet.  Drink plenty of fluids but you should avoid alcoholic beverages for 24 hours.  ACTIVITY:  You should plan to take it easy for the rest of today and you  should NOT DRIVE or use heavy machinery until tomorrow (because of the sedation medicines used during the test).    FOLLOW UP: Our staff will call the number listed on your records the next business day following your procedure to check on you and address any questions or concerns that you may have regarding the information given to you following your procedure. If we do not reach you, we will leave a message.  However, if you are feeling well and you are not experiencing any problems, there is no need to return our call.  We will assume that you have returned to your regular daily activities without incident.  If any biopsies were taken you will be contacted by phone or by letter within the next 1-3 weeks.  Please call us at [phone] if you have not heard about the biopsies in 3 weeks.    SIGNATURES/CONFIDENTIALITY: You and/or your care partner have signed paperwork which will be entered into your electronic medical record.  These signatures attest to the fact that that the information above on your After Visit Summary has been reviewed and is understood.  Full responsibility of the confidentiality of this discharge information lies with you and/or your care-partner.

## 2017-03-29 ENCOUNTER — Telehealth: Payer: Self-pay

## 2017-03-29 ENCOUNTER — Telehealth: Payer: Self-pay | Admitting: *Deleted

## 2017-03-29 NOTE — Telephone Encounter (Signed)
Attempted to reach patient for post-procedure f/u call. No answer. Left message that we will attempt to reach her again later today and for her to please not hesitate to call us if she has any questions/concerns regarding her care. 

## 2017-03-29 NOTE — Telephone Encounter (Signed)
  Follow up Call-  Call back number 03/28/2017  Post procedure Call Back phone  # [phone]  Permission to leave phone message Yes  Some recent data might be hidden     Patient questions:  Do you have a fever, pain , or abdominal swelling? No. Pain Score  0 *  Have you tolerated food without any problems? Yes.    Have you been able to return to your normal activities? Yes.    Do you have any questions about your discharge instructions: Diet   No. Medications  No. Follow up visit  No.  Do you have questions or concerns about your Care? No.  Actions: * If pain score is 4 or above: No action needed, pain <4.

## 2017-03-31 ENCOUNTER — Ambulatory Visit (INDEPENDENT_AMBULATORY_CARE_PROVIDER_SITE_OTHER): Payer: Medicare Other | Admitting: Podiatry

## 2017-03-31 ENCOUNTER — Encounter: Payer: Self-pay | Admitting: Podiatry

## 2017-03-31 DIAGNOSIS — M19071 Primary osteoarthritis, right ankle and foot: Secondary | ICD-10-CM

## 2017-04-01 ENCOUNTER — Other Ambulatory Visit: Payer: Self-pay | Admitting: Internal Medicine

## 2017-04-01 DIAGNOSIS — F32A Depression, unspecified: Secondary | ICD-10-CM

## 2017-04-01 DIAGNOSIS — F320 Major depressive disorder, single episode, mild: Secondary | ICD-10-CM

## 2017-04-03 ENCOUNTER — Encounter: Payer: Self-pay | Admitting: Internal Medicine

## 2017-04-03 NOTE — Telephone Encounter (Signed)
Patient given one refill of sertraline 50mg . She is scheduled for follow up with me on 04/21/17.

## 2017-04-04 NOTE — Progress Notes (Signed)
Subjective: Tepper presents the office today for follow-up evaluation of right foot pain and to discuss MRI results. She states that she is remaining in the surgical shoe. She denies any recent injury or trauma she's had no swelling or redness. She did get her new diabetic shoes and inserts but she has not been wearing them. She has no new concerns today.Denies any systemic complaints such as fevers, chills, nausea, vomiting. No acute changes since last appointment, and no other complaints at this time.   Objective: AAO x3, NAD DP/PT pulses palpable bilaterally, CRT less than 3 seconds There is no significant hyperkeratotic tissue buildup bilaterally. There does remain tenderness along the dorsal aspect of the right midfoot on the metatarsocuneiform joint. It has been a small bony exostosis palpable to this area as well. There is no area pinpoint bony tenderness there is no pain the vibratory sensation although she does have neuropathy as well. There is no erythema or increase in warmth. No open lesions or pre-ulcerative lesions.  No pain with calf compression, swelling, warmth, erythema  Assessment: Osteoarthritis right foot  Plan: -All treatment options discussed with the patient including all alternatives, risks, complications.  -MRI results were discussed with the patient was sent radial osteophytic changes of the midfoot. No evidence of soft tissue abscess or osteomyelitis. -At this point when his try to start to wear the diabetic shoes and inserts. She brought them with her today we had her try them on and she walk on the office and she states they felt on her feet. We will slowly transition into wearing a regular diabetic shoe with the insert. Discussed topical anti-inflammatory creams to help with the arthritis pain.  -Patient encouraged to call the office with any questions, concerns, change in symptoms.   Celesta Gentile, DPM

## 2017-04-20 ENCOUNTER — Ambulatory Visit (INDEPENDENT_AMBULATORY_CARE_PROVIDER_SITE_OTHER): Payer: Medicare Other | Admitting: Podiatry

## 2017-04-20 ENCOUNTER — Encounter: Payer: Self-pay | Admitting: Podiatry

## 2017-04-20 DIAGNOSIS — E1149 Type 2 diabetes mellitus with other diabetic neurological complication: Secondary | ICD-10-CM

## 2017-04-20 DIAGNOSIS — B351 Tinea unguium: Secondary | ICD-10-CM | POA: Diagnosis not present

## 2017-04-20 DIAGNOSIS — M79676 Pain in unspecified toe(s): Secondary | ICD-10-CM

## 2017-04-20 DIAGNOSIS — L84 Corns and callosities: Secondary | ICD-10-CM

## 2017-04-20 DIAGNOSIS — M79674 Pain in right toe(s): Secondary | ICD-10-CM | POA: Diagnosis not present

## 2017-04-20 DIAGNOSIS — M79675 Pain in left toe(s): Secondary | ICD-10-CM | POA: Diagnosis not present

## 2017-04-20 DIAGNOSIS — Q828 Other specified congenital malformations of skin: Secondary | ICD-10-CM | POA: Diagnosis not present

## 2017-04-20 NOTE — Progress Notes (Signed)
Subjective: 66 y.o. returns the office today for painful, elongated, thickened toenails which she cannot trim herself. Denies any redness or drainage around the nails.  She gets painful calluses to her feet as well. She states that otherwise she is doing well.  Her diabetic shoes are causing some discomfort to her feet and her heels slip.  The right foot overall is feeling better. Denies any acute changes since last appointment and no new complaints today. Denies any systemic complaints such as fevers, chills, nausea, vomiting.   PCP: Lars Mage, MD  Objective: AAO 3, NAD DP/PT pulses palpable, CRT less than 3 seconds Sensation decreased with SWMF Nails hypertrophic, dystrophic, elongated, brittle, discolored 9. There is tenderness overlying the nails 1-5 on the left and 2-5 on the right. There is no surrounding erythema or drainage along the nail sites. Hyperkeratotic lesions right distal second toe, left second distal toe and medial 1st MTPJ on the left.  No open lesions or pre-ulcerative lesions are identified. No area of pinpoint tenderness and there is no swelling bilaterally.  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  No other areas of tenderness bilateral lower extremities. No overlying edema, erythema, increased warmth. No pain with calf compression, swelling, warmth, erythema.  Assessment: Patient presents with symptomatic onychomycosis; pre-ulcerative calluses  Plan: -Treatment options including alternatives, risks, complications were discussed -Nails sharply debrided 9 without complication/bleeding. -Hyperkeratotic lesions sharply debrided x 3 without any complications or bleeding.  -Discussed daily foot inspection. If there are any changes, to call the office immediately.  -Follow-up in 9 weeks or sooner if any problems are to arise. In the meantime, encouraged to call the office with any questions, concerns, changes symptoms.  Celesta Gentile, DPM

## 2017-04-21 ENCOUNTER — Encounter: Payer: Self-pay | Admitting: Internal Medicine

## 2017-04-21 ENCOUNTER — Ambulatory Visit (INDEPENDENT_AMBULATORY_CARE_PROVIDER_SITE_OTHER): Payer: Medicare Other | Admitting: Internal Medicine

## 2017-04-21 VITALS — BP 122/58 | HR 78 | Temp 97.0°F | Ht 66.0 in | Wt 187.5 lb

## 2017-04-21 DIAGNOSIS — E1165 Type 2 diabetes mellitus with hyperglycemia: Secondary | ICD-10-CM | POA: Diagnosis not present

## 2017-04-21 DIAGNOSIS — E1142 Type 2 diabetes mellitus with diabetic polyneuropathy: Secondary | ICD-10-CM

## 2017-04-21 DIAGNOSIS — E118 Type 2 diabetes mellitus with unspecified complications: Secondary | ICD-10-CM

## 2017-04-21 DIAGNOSIS — M79671 Pain in right foot: Secondary | ICD-10-CM | POA: Diagnosis not present

## 2017-04-21 DIAGNOSIS — E1140 Type 2 diabetes mellitus with diabetic neuropathy, unspecified: Secondary | ICD-10-CM | POA: Diagnosis not present

## 2017-04-21 DIAGNOSIS — IMO0002 Reserved for concepts with insufficient information to code with codable children: Secondary | ICD-10-CM

## 2017-04-21 DIAGNOSIS — I10 Essential (primary) hypertension: Secondary | ICD-10-CM

## 2017-04-21 LAB — POCT GLYCOSYLATED HEMOGLOBIN (HGB A1C): Hemoglobin A1C: 7.3

## 2017-04-21 LAB — GLUCOSE, CAPILLARY: Glucose-Capillary: 107 mg/dL — ABNORMAL HIGH (ref 65–99)

## 2017-04-21 NOTE — Progress Notes (Signed)
   CC: Diabetes follow up  HPI:  [name] is a 66 y.o. female with past medical history asthma, CHF hypertension, diabetes mellitus who presents for diabetes follow-up.   Past Medical History:  Diagnosis Date  . Allergy   . Anemia   . Arthritis    "left knee" (09/08/2015)  . Asthma   . Brachial plexus disorders   . CHF (congestive heart failure) ([place])   . Chronic pain    neck pain, headache, neuropathy  . COPD (chronic obstructive pulmonary disease) ([place])    SEES ONLY DR. PATEL   . Depression    "when my husband passed in 2013"  . GERD (gastroesophageal reflux disease)   . Hypertension   . Hypertriglyceridemia   . Migraine    "monthly" (09/08/2015)  . Neuromuscular disorder ([facility])    neuropathy  . Puncture wound of foot, right 05/17/2012   Tetanus shot 3 yrs ago at [facility] in [place], per pt report   . Stroke ([place])    [facility]    IN [place]   4 YRS AGO   . Type II diabetes mellitus ([place]) 2010   diagnosed around 2010, only ever on metformin   Review of Systems:  She denies nausea, vomiting, abdominal pain, dizziness, sob, or chest pain  Physical Exam:  Vitals:   04/21/17 1456  BP: (!) 122/58  Pulse: 78  Temp: (!) 97 F (36.1 C)  TempSrc: Oral  SpO2: 100%  Weight: 187 lb 8 oz (85 kg)  Height: 5\' 6"  (1.676 m)   Physical Exam  Constitutional: She appears well-developed and well-nourished. No distress.  HENT:  Head: Atraumatic.  Eyes: Conjunctivae are normal.  Cardiovascular: Normal rate and normal heart sounds.  Pulmonary/Chest: Effort normal and breath sounds normal. No stridor. No respiratory distress.  Abdominal: Soft. Bowel sounds are normal. She exhibits no distension. There is no tenderness.  Musculoskeletal:  The patient feels pain on the right foot. Dorsalis pedis pulse is palpated in bilateral feet.  Neurological: She is alert.  Skin: She is not diaphoretic.  Psychiatric: She has a normal mood and affect. Her behavior is normal. Judgment  and thought content normal.     Assessment & Plan:   See Encounters Tab for problem based charting.  Patient seen with Dr. Beryle Beams

## 2017-04-21 NOTE — Progress Notes (Signed)
Medicine attending: I personally interviewed and briefly examined this patient on the day of the patient visit and reviewed pertinent clinical ,laboratory, and radiographic data  with resident physician Dr.Vahini Chundi  and we discussed a management plan. Diabetic. Peripheral neuropathy. 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 Has neuropathic pain on [place] but now different pain at level of metatarsal bones across top of foot. Foot warm, well perfused, DP & PT pulses not palpable. She feels this is arthritis pain. I agree. Rec continue to use ES-Tylenol or Ibuprofen at bedtime since pain worse at night.

## 2017-04-21 NOTE — Patient Instructions (Addendum)
It was a pleasure to see you today [name]. Please make the following changes:  -Take motrin or extra strength tylenol for your foot pain -Please take regular blood glucose readings to your next appointment.  -Continue taking all your medications -3 months  If you have any questions or concerns, please call our clinic at [phone] between 9am-5pm and after hours call [phone] and ask for the internal medicine resident on call. If you feel you are having a medical emergency please call 911.   Thank you, we look forward to help you remain healthy!  Lars Mage, MD Internal Medicine PGY1   Osteoarthritis Osteoarthritis is a type of arthritis that affects tissue that covers the ends of bones in joints (cartilage). Cartilage acts as a cushion between the bones and helps them move smoothly. Osteoarthritis results when cartilage in the joints gets worn down. Osteoarthritis is sometimes called "wear and tear" arthritis. Osteoarthritis is the most common form of arthritis. It often occurs in older people. It is a condition that gets worse over time (a progressive condition). Joints that are most often affected by this condition are in:  Fingers.  Toes.  Hips.  Knees.  Spine, including neck and lower back.  What are the causes? This condition is caused by age-related wearing down of cartilage that covers the ends of bones. What increases the risk? The following factors may make you more likely to develop this condition:  Older age.  Being overweight or obese.  Overuse of joints, such as in athletes.  Past injury of a joint.  Past surgery on a joint.  Family history of osteoarthritis.  What are the signs or symptoms? The main symptoms of this condition are pain, swelling, and stiffness in the joint. The joint may lose its shape over time. Small pieces of bone or cartilage may break off and float inside of the joint, which may cause more pain and damage to the joint.  Small deposits of bone (osteophytes) may grow on the edges of the joint. Other symptoms may include:  A grating or scraping feeling inside the joint when you move it.  Popping or creaking sounds when you move.  Symptoms may affect one or more joints. Osteoarthritis in a major joint, such as your knee or hip, can make it painful to walk or exercise. If you have osteoarthritis in your hands, you might not be able to grip items, twist your hand, or control small movements of your hands and fingers (fine motor skills). How is this diagnosed? This condition may be diagnosed based on:  Your medical history.  A physical exam.  Your symptoms.  X-rays of the affected joint(s).  Blood tests to rule out other types of arthritis.  How is this treated? There is no cure for this condition, but treatment can help to control pain and improve joint function. Treatment plans may include:  A prescribed exercise program that allows for rest and joint relief. You may work with a physical therapist.  A weight control plan.  Pain relief techniques, such as: ? Applying heat and cold to the joint. ? Electric pulses delivered to nerve endings under the skin (transcutaneous electrical nerve stimulation, or TENS). ? Massage. ? Certain nutritional supplements.  NSAIDs or prescription medicines to help relieve pain.  Medicine to help relieve pain and inflammation (corticosteroids). This can be given by mouth (orally) or as an injection.  Assistive devices, such as a brace, wrap, splint, specialized glove, or cane.  Surgery, such as: ?  An osteotomy. This is done to reposition the bones and relieve pain or to remove loose pieces of bone and cartilage. ? Joint replacement surgery. You may need this surgery if you have very bad (advanced) osteoarthritis.  Follow these instructions at home: Activity  Rest your affected joints as directed by your health care provider.  Do not drive or use heavy machinery  while taking prescription pain medicine.  Exercise as directed. Your health care provider or physical therapist may recommend specific types of exercise, such as: ? Strengthening exercises. These are done to strengthen the muscles that support joints that are affected by arthritis. They can be performed with weights or with exercise bands to add resistance. ? Aerobic activities. These are exercises, such as brisk walking or water aerobics, that get your heart pumping. ? Range-of-motion activities. These keep your joints easy to move. ? Balance and agility exercises. Managing pain, stiffness, and swelling  If directed, apply heat to the affected area as often as told by your health care provider. Use the heat source that your health care provider recommends, such as a moist heat pack or a heating pad. ? If you have a removable assistive device, remove it as told by your health care provider. ? Place a towel between your skin and the heat source. If your health care provider tells you to keep the assistive device on while you apply heat, place a towel between the assistive device and the heat source. ? Leave the heat on for 20-30 minutes. ? Remove the heat if your skin turns bright red. This is especially important if you are unable to feel pain, heat, or cold. You may have a greater risk of getting burned.  If directed, put ice on the affected joint: ? If you have a removable assistive device, remove it as told by your health care provider. ? Put ice in a plastic bag. ? Place a towel between your skin and the bag. If your health care provider tells you to keep the assistive device on during icing, place a towel between the assistive device and the bag. ? Leave the ice on for 20 minutes, 2-3 times a day. General instructions  Take over-the-counter and prescription medicines only as told by your health care provider.  Maintain a healthy weight. Follow instructions from your health care provider  for weight control. These may include dietary restrictions.  Do not use any products that contain nicotine or tobacco, such as cigarettes and e-cigarettes. These can delay bone healing. If you need help quitting, ask your health care provider.  Use assistive devices as directed by your health care provider.  Keep all follow-up visits as told by your health care provider. This is important. Where to find more information:  [employer] of Arthritis and Musculoskeletal and Skin Diseases: www.niams.SouthExposed.es  [employer] on Aging: http://kim-miller.com/  American College of Rheumatology: www.rheumatology.org Contact a health care provider if:  Your skin turns red.  You develop a rash.  You have pain that gets worse.  You have a fever along with joint or muscle aches. Get help right away if:  You lose a lot of weight.  You suddenly lose your appetite.  You have night sweats. Summary  Osteoarthritis is a type of arthritis that affects tissue covering the ends of bones in joints (cartilage).  This condition is caused by age-related wearing down of cartilage that covers the ends of bones.  The main symptom of this condition is pain, swelling, and stiffness  in the joint.  There is no cure for this condition, but treatment can help to control pain and improve joint function. This information is not intended to replace advice given to you by your health care provider. Make sure you discuss any questions you have with your health care provider. Document Released: 05/30/2005 Document Revised: 02/01/2016 Document Reviewed: 02/01/2016 Elsevier Interactive Patient Education  [employer].

## 2017-04-22 DIAGNOSIS — M79671 Pain in right foot: Secondary | ICD-10-CM | POA: Insufficient documentation

## 2017-04-22 NOTE — Assessment & Plan Note (Signed)
The patient's hemoglobin A1c= 7.3 and random glucose measurement of 107 during this visit.  Patient's A1c during the previous the previous visit was 6.6.  Patient's home blood glucose measurements over the past month have ranged 74-233. The patient states that her blood sugars have been high recently over 400s. Patient reviewed the patient's blood glucose readings it shows that he has had an episode of hypoglycemia at 62.  The patient is taking glipizide 5 mg daily, 100 mg daily, metformin 750 mg daily. She is compliant with her medication. She states that she has not been eating as much as she usually does. She has gained 2 pounds since her last visit on 03/28/2017.  The patient was seen by podiatry yesterday 04/20/2017.  Her nails were divided x9 patient bleeding, hyperkeratotic lesions x3 without any complications.  She is scheduled for follow-up in 9 weeks. -Continue current diabetes medication regimen -There is limited glucose readings. Although the patient states that she has had some isolated high readings, we do not know how often that occurs and when it occurs. Requested that the patient takes more regular readings and return to the clinic to determine whether her diabetic regimen will need to be adjusted. I suspect that the patient should be well controlled on her current medication regimen as the patient's HbA1C=7.3 during this visit. I worry about increasing her dose of medication without proper readings as she has had one documented low blood glucose reading.

## 2017-04-22 NOTE — Assessment & Plan Note (Signed)
Patient's blood pressure during this visit is 122/58, 78.  The patient is currently taking lisinopril 40 mg daily, amlodipine 2.5 mg daily.  -Continue current anti-htn meds

## 2017-04-22 NOTE — Assessment & Plan Note (Signed)
The patient states that she has been having pain on the dorsal aspect of her right foot. The patient gets the pain nightly after she has walked on her feet. The pain is 12/10 intensity, only occurs at nighttime, does not worsen with changes in position, is nonradiating, throbbing in nature, and continuous throughout the night. She has been using acetaminophen 650mg  one twice a day for the past 2 weeks. The patient states that it has not helped the pain. The patient had a MRI right foot (03/22/2017) showed osteoarthritic changes with no evidence of soft tissue abscess or osteomyelitis.  -Continue neurontin 1200mg  tid  -Recommended that the patient takes motrin or another nsaid for her foot pain as it is likely osteoarthritic in nature. The patient's most recent cr=1.00 (01/20/17).

## 2017-04-25 ENCOUNTER — Other Ambulatory Visit: Payer: Self-pay

## 2017-04-25 NOTE — Telephone Encounter (Signed)
JANUVIA 100 MG tablet   Refill request @ [facility] on [address].

## 2017-04-26 MED ORDER — SITAGLIPTIN PHOSPHATE 100 MG PO TABS: 100.0000 mg | ORAL_TABLET | Freq: Every day | ORAL | 0 refills | Status: DC

## 2017-04-26 NOTE — Telephone Encounter (Signed)
Refilled 90 day supply of Januvia 100mg  qd 04/26/17

## 2017-05-08 ENCOUNTER — Other Ambulatory Visit: Payer: Self-pay

## 2017-05-08 DIAGNOSIS — E1142 Type 2 diabetes mellitus with diabetic polyneuropathy: Secondary | ICD-10-CM

## 2017-05-08 DIAGNOSIS — F320 Major depressive disorder, single episode, mild: Secondary | ICD-10-CM

## 2017-05-08 DIAGNOSIS — F32A Depression, unspecified: Secondary | ICD-10-CM

## 2017-05-08 NOTE — Telephone Encounter (Signed)
lisinopril (PRINIVIL,ZESTRIL) 40 MG tablet,  sertraline (ZOLOFT) 50 MG tablet,  gabapentin (NEURONTIN) 600 MG tablet,  Ibuprofen 800 mg to be filled @ [facility] on [address]. Please call pt back.

## 2017-05-08 NOTE — Telephone Encounter (Signed)
Does she need refill or a call back?

## 2017-05-08 NOTE — Telephone Encounter (Signed)
Need refills, thanks!

## 2017-05-09 MED ORDER — GABAPENTIN 600 MG PO TABS: 1200.0000 mg | ORAL_TABLET | Freq: Three times a day (TID) | ORAL | 0 refills | Status: DC

## 2017-05-09 MED ORDER — SERTRALINE HCL 50 MG PO TABS: 50.0000 mg | ORAL_TABLET | Freq: Every day | ORAL | 0 refills | Status: DC

## 2017-05-09 MED ORDER — LISINOPRIL 40 MG PO TABS: 40.0000 mg | ORAL_TABLET | Freq: Every day | ORAL | 0 refills | Status: DC

## 2017-05-09 NOTE — Telephone Encounter (Signed)
Refilled gabapentin 600mg  2 tablets 3 times daily (30 day supply), sertraline 50mg  (30 day supply), and lisinopril 40mg  (60day supply)

## 2017-05-09 NOTE — Telephone Encounter (Signed)
Do you know what she is using the ibuprofen for?

## 2017-05-10 NOTE — Telephone Encounter (Signed)
Lm to rtc- need to know indication for IB refill request.

## 2017-05-11 MED ORDER — IBUPROFEN 200 MG PO TABS: 800.0000 mg | ORAL_TABLET | Freq: Two times a day (BID) | ORAL | 0 refills | Status: AC | PRN

## 2017-05-11 NOTE — Telephone Encounter (Signed)
She takes IB otc 600 mg TID. Requesting rx for 800 mg for arthritis in her feet & hips. Said 600 mg not enough relief asking 800 mg so she can move again.

## 2017-05-11 NOTE — Addendum Note (Signed)
Addended by: Lars Mage on: 05/11/2017 12:51 PM   Modules accepted: Orders

## 2017-05-11 NOTE — Telephone Encounter (Signed)
Gave the patient ibuprofen 800mg  to use prn. Thank you!

## 2017-05-26 ENCOUNTER — Ambulatory Visit: Payer: Medicare Other | Admitting: Podiatry

## 2017-06-01 ENCOUNTER — Encounter: Payer: Self-pay | Admitting: Podiatry

## 2017-06-01 ENCOUNTER — Ambulatory Visit (INDEPENDENT_AMBULATORY_CARE_PROVIDER_SITE_OTHER): Payer: Medicare Other

## 2017-06-01 ENCOUNTER — Ambulatory Visit (INDEPENDENT_AMBULATORY_CARE_PROVIDER_SITE_OTHER): Payer: Medicare Other | Admitting: Podiatry

## 2017-06-01 DIAGNOSIS — E1149 Type 2 diabetes mellitus with other diabetic neurological complication: Secondary | ICD-10-CM | POA: Diagnosis not present

## 2017-06-01 DIAGNOSIS — L84 Corns and callosities: Secondary | ICD-10-CM

## 2017-06-01 DIAGNOSIS — S93401A Sprain of unspecified ligament of right ankle, initial encounter: Secondary | ICD-10-CM | POA: Diagnosis not present

## 2017-06-03 NOTE — Progress Notes (Signed)
Subjective: [name] presents the office today for concerns of a callus to the right second toe at the tip of the toe.  She also states that she still gets pain in the top of her foot which is unchanged.  She is concerned because when she wears her new diabetic shoes she is rolling her ankle. She did roll her ankle once. She feels that the inserts of the shoes causing her to rule out words.  She denies any swelling increase or any redness.  She has no other concerns. Denies any systemic complaints such as fevers, chills, nausea, vomiting. No acute changes since last appointment, and no other complaints at this time.   Objective: AAO x3, NAD DP/PT pulses palpable bilaterally, CRT less than 3 seconds Hyperkeratotic lesion, pre-ulcerative lesion to the right distal second toe.  Upon debridement there is no underlying ulceration drainage or any signs of infection but the area is pre-ulcerative.  There is chronic discomfort along the midfoot.  There is no area pinpoint tenderness to the ankle and there is no gross ankle instability present.  Upon evaluation of her orthotics inside of her diabetic shoes they do have a high arch.   No open lesions or pre-ulcerative lesions.  No pain with calf compression, swelling, warmth, erythema  Assessment:  Plan: -All treatment options discussed with the patient including all alternatives, risks, complications.  -X-rays were obtained and reviewed.  There is no evidence of acute fracture identified.  Ankle joint maintained.  Midfoot arthritic changes are present. -Debrided the hyperkeratotic lesion today without any complications or bleeding -I had [name] evaluate and modify the inserts to help her from rolling outwards.  Discussed with her that if they do continue to roll to stop wearing them and call the office. -Daily foot  Inspection. -Monitor for any clinical signs or symptoms of infection and directed to call the office immediately should any occur or go to the  ER. -Patient encouraged to call the office with any questions, concerns, change in symptoms.   [name] DPM

## 2017-06-04 ENCOUNTER — Ambulatory Visit (HOSPITAL_COMMUNITY)
Admission: EM | Admit: 2017-06-04 | Discharge: 2017-06-04 | Disposition: A | Discharge status: Home / Self Care | Payer: Medicare Other | Attending: Family Medicine | Admitting: Family Medicine

## 2017-06-04 ENCOUNTER — Encounter (HOSPITAL_COMMUNITY): Payer: Self-pay | Admitting: Emergency Medicine

## 2017-06-04 DIAGNOSIS — K0889 Other specified disorders of teeth and supporting structures: Secondary | ICD-10-CM | POA: Diagnosis not present

## 2017-06-04 MED ORDER — ONDANSETRON HCL 4 MG PO TABS: 4.0000 mg | ORAL_TABLET | Freq: Four times a day (QID) | ORAL | 0 refills | Status: DC

## 2017-06-04 MED ORDER — MELOXICAM 7.5 MG PO TABS: 7.5000 mg | ORAL_TABLET | Freq: Every day | ORAL | 0 refills | Status: DC

## 2017-06-04 MED ORDER — PENICILLIN V POTASSIUM 500 MG PO TABS: 500.0000 mg | ORAL_TABLET | Freq: Four times a day (QID) | ORAL | 0 refills | Status: DC

## 2017-06-04 MED ORDER — TRAMADOL HCL 50 MG PO TABS: 50.0000 mg | ORAL_TABLET | Freq: Two times a day (BID) | ORAL | 0 refills | Status: DC | PRN

## 2017-06-04 MED ORDER — PENICILLIN V POTASSIUM 500 MG PO TABS: 500.0000 mg | ORAL_TABLET | Freq: Four times a day (QID) | ORAL | 0 refills | Status: AC

## 2017-06-04 NOTE — ED Triage Notes (Signed)
PT C/O: right lower pain ... Has a broken tooth  ONSET: yest   DENIES: fevers, chills  TAKING MEDS: orajel   A&O x4... NAD... Ambulatory

## 2017-06-04 NOTE — Discharge Instructions (Addendum)
Start Penicillin as directed for dental abscess. Mobic and oragel for pain. Stop ibuprofen while you are on mobic. Tramadol for break through pain. Zofran as needed for nausea/vomiting. Follow up with dentist for further treatment and evaluation. If experiencing swelling of the throat, trouble breathing, trouble swallowing, follow up for reevaluation.

## 2017-06-04 NOTE — ED Provider Notes (Signed)
[facility]    CSN: [id number] Arrival date & time: 06/04/17  1702     History   Chief Complaint Chief Complaint  Patient presents with  . Dental Pain    HPI [name] is a 66 y.o. female.   66 year old female comes in for 1 day history of dental pain.  She has a known cracked tooth.  States usually she can apply Orajel and resolve the pain, but this time pain continues despite oral gel and ibuprofen use.  She denies fever, chills, night sweats.  Denies swelling of the face.  Denies trouble breathing, swallowing.  States that she moved from [place] and does not have a       Past Medical History:  Diagnosis Date  . Allergy   . Anemia   . Arthritis    "left knee" (09/08/2015)  . Asthma   . Brachial plexus disorders   . CHF (congestive heart failure) ([place])   . Chronic pain    neck pain, headache, neuropathy  . COPD (chronic obstructive pulmonary disease) ([place])    SEES ONLY DR. PATEL   . Depression    "when my husband passed in 2013"  . GERD (gastroesophageal reflux disease)   . Hypertension   . Hypertriglyceridemia   . Migraine    "monthly" (09/08/2015)  . Neuromuscular disorder (HCC)    neuropathy  . Puncture wound of foot, right 05/17/2012   Tetanus shot 3 yrs ago at [facility] in [place], per pt report   . Stroke ([place])    TIAS    IN [place]   4 YRS AGO   . Type II diabetes mellitus ([place]) 2010   diagnosed around 2010, only ever on metformin    Patient Active Problem List   Diagnosis Date Noted  . Right foot pain 04/22/2017  . Health maintenance examination 01/21/2017  . Blister (nonthermal) of right ring finger, initial encounter 01/21/2017  . History of total knee replacement, 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  . Neck muscle strain 08/19/2016  . Dog bite, hand, left, initial encounter 08/19/2016  . Onychomycosis of multiple toenails with type 2 diabetes mellitus ([place]) 11/04/2015  . Skin lesions 11/04/2015  . Screening for colon cancer 11/04/2015    . Orthostatic syncope   . Sinusitis 06/19/2015  . Obesity (BMI 30.0-34.9) 05/27/2015  . Fatigue 05/11/2015  . Leg ulcer, left ([place]) 03/05/2015  . Recurrent falls 03/05/2015  . Primary osteoarthritis of left knee 09/22/2014  . Esophageal reflux 07/18/2014  . Hyperlipidemia associated with type 2 diabetes mellitus ([place]) 02/26/2014  . Tobacco use disorder 07/22/2013  . Headache 06/28/2013  . Diabetic peripheral neuropathy associated with type 2 diabetes mellitus ([place]) 06/14/2013  . Hx-TIA (transient ischemic attack) 03/10/2013  . Mild depression ([place]) 12/13/2012  . Diabetes mellitus type 2, controlled, with complications ([place]) 25/42/7062  . Hypertension 05/13/2012    Past Surgical History:  Procedure Laterality Date  . 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 ARTHRODESIS METATARSAL     RIGHT 5 TH   . BILATERAL SALPINGOOPHORECTOMY Bilateral 1986   "after hemtoma evacuations; had to cut me open"  . CARPAL TUNNEL RELEASE Bilateral   . COLONOSCOPY     10 + yrs ago- pt unsure- was in [place]- pt states  MD will not send records but colon was normal per pt.   Mar[facility] DILATION AND CURETTAGE OF UTERUS  ~  1982   S/P miscarriage  . HEMATOMA EVACUATION Right 1986 x 2   "OVARY; w/in 1 wk after hysterectomy"  . KNEE ARTHROSCOPY     LEFT  . LAPAROSCOPIC CHOLECYSTECTOMY    . SHOULDER ARTHROSCOPY W/ ROTATOR CUFF REPAIR Bilateral   . TONSILLECTOMY    . TOTAL KNEE ARTHROPLASTY Left 09/30/2016   Procedure: LEFT TOTAL KNEE ARTHROPLASTY;  Surgeon: Netta Cedars, MD;  Location: [facility];  Service: Orthopedics;  Laterality: Left;  . TUBAL LIGATION    . VAGINAL HYSTERECTOMY  1986    OB History    No data available       Home Medications    Prior to Admission medications   Medication Sig Start Date End Date Taking? Authorizing Provider  albuterol (PROVENTIL HFA;VENTOLIN HFA)  108 (90 Base) MCG/ACT inhaler Inhale 1-2 puffs into the lungs every 6 (six) hours as needed for wheezing or shortness of breath. 02/10/17  Yes Lucious Groves, DO  amLODipine (NORVASC) 2.5 MG tablet Take 1 tablet (2.5 mg total) by mouth daily. 02/10/17 02/10/18 Yes Lucious Groves, DO  aspirin 81 MG tablet Take 81 mg by mouth every morning.    Yes [provider]  aspirin-acetaminophen-caffeine (EXCEDRIN MIGRAINE) [id number] MG tablet Take 2 tablets by mouth every 8 (eight) hours as needed for headache.    Yes [provider]  Blood Glucose Monitoring Suppl (ONETOUCH VERIO) w/Device KIT 1 each by Does not apply route 3 (three) times daily. 06/03/16  Yes Sid Falcon, MD  fluticasone (FLONASE) 50 MCG/ACT nasal spray USE 2 SPRAYS IN EACH NOSTRIL DAILY 11/14/16  Yes Riccardo Dubin, MD  gabapentin (NEURONTIN) 600 MG tablet Take 2 tablets (1,200 mg total) by mouth 3 (three) times daily. 05/09/17 06/08/17 Yes Chundi, Vahini, MD  glipiZIDE (GLUCOTROL XL) 5 MG 24 hr tablet Take 1 tablet (5 mg total) by mouth daily with breakfast. 02/10/17  Yes Lucious Groves, DO  glucose blood (ONETOUCH VERIO) test strip Check blood sugar 3x/day 06/03/16  Yes Sid Falcon, MD  lisinopril (PRINIVIL,ZESTRIL) 40 MG tablet Take 1 tablet (40 mg total) by mouth daily. 05/09/17 07/08/17 Yes Chundi, Verne Spurr, MD  metFORMIN (GLUCOPHAGE-XR) 750 MG 24 hr tablet take 2 tablets by mouth once daily WITH BREAKFAST 02/07/17  Yes Lucious Groves, DO  ONETOUCH DELICA LANCETS FINE MISC Check blood sugar up to 3 times a day 05/31/16  Yes Sid Falcon, MD  pantoprazole (PROTONIX) 20 MG tablet Take 1 tablet (20 mg total) by mouth daily as needed. 02/10/17  Yes Lucious Groves, DO  rosuvastatin (CRESTOR) 40 MG tablet Take 1 tablet (40 mg total) by mouth daily. 02/08/17  Yes Bartholomew Crews, MD  sertraline (ZOLOFT) 50 MG tablet Take 1 tablet (50 mg total) by mouth daily. 05/09/17 06/08/17 Yes Chundi, Verne Spurr, MD  sitaGLIPtin  (JANUVIA) 100 MG tablet Take 1 tablet (100 mg total) daily by mouth. 04/26/17  Yes Chundi, Vahini, MD  ibuprofen (ADVIL) 200 MG tablet Take 4 tablets (800 mg total) by mouth 2 (two) times daily as needed. 05/11/17 06/10/17  Lars Mage, MD  meloxicam (MOBIC) 7.5 MG tablet Take 1 tablet (7.5 mg total) by mouth daily. 06/04/17   Tasia Catchings, Amy V, PA-C  ondansetron (ZOFRAN) 4 MG tablet Take 1 tablet (4 mg total) by mouth every 6 (six) hours. 06/04/17   Tasia Catchings, Amy V, PA-C  penicillin v potassium (VEETID) 500 MG tablet Take 1 tablet (500 mg total) by mouth 4 (four) times daily for  7 days. 06/04/17 06/11/17  Ok Edwards, PA-C  traMADol (ULTRAM) 50 MG tablet Take 1 tablet (50 mg total) by mouth every 12 (twelve) hours as needed for severe pain. 06/04/17   Ok Edwards, PA-C    Family History Family History  Problem Relation Age of Onset  . Hyperlipidemia Mother   . Heart attack Father 17  . Hypertension Father   . Cancer Paternal Grandfather        Lung cancer  . Cancer Maternal Grandmother   . Heart attack Maternal Grandmother   . Colon cancer Neg Hx   . Colon polyps Neg Hx   . Esophageal cancer Neg Hx   . Rectal cancer Neg Hx   . Stomach cancer Neg Hx     Social History Social History   Tobacco Use  . Smoking status: Former Smoker    Packs/day: 0.10    Years: 47.00    Pack years: 4.70    Types: Cigarettes    Last attempt to quit: 08/11/2016    Years since quitting: 0.8  . Smokeless tobacco: Never Used  . Tobacco comment:  2 cigs per day  Substance Use Topics  . Alcohol use: No    Alcohol/week: 0.0 oz  . Drug use: No     Allergies   Anesthesia s-i-60; Flexeril [cyclobenzaprine]; and Soma [carisoprodol]   Review of Systems Review of Systems  Reason unable to perform ROS: See HPI as above.     Physical Exam Triage Vital Signs ED Triage Vitals  Enc Vitals Group     BP 06/04/17 1732 110/60     Pulse Rate 06/04/17 1732 90     Resp 06/04/17 1732 20     Temp 06/04/17 1732 98.9 F  (37.2 C)     Temp Source 06/04/17 1732 Oral     SpO2 06/04/17 1732 98 %     Weight --      Height --      Head Circumference --      Peak Flow --      Pain Score 06/04/17 1731 9     Pain Loc --      Pain Edu? --      Excl. in [place]? --    No data found.  Updated Vital Signs BP 110/60 (BP Location: Left Arm)   Pulse 90   Temp 98.9 F (37.2 C) (Oral)   Resp 20   SpO2 98%   Physical Exam  Constitutional: She is oriented to person, place, and time. She appears well-developed and well-nourished. No distress.  HENT:  Head: Normocephalic and atraumatic.  Mouth/Throat: Uvula is midline, oropharynx is clear and moist and mucous membranes are normal. She has dentures (upper tooth).  General poor dentition of the lower teeth.  Tooth #26 broken.  Tenderness on palpation around the gums.  Floor of mouth soft.  No obvious facial swelling.  Eyes: Conjunctivae are normal. Pupils are equal, round, and reactive to light.  Neurological: She is alert and oriented to person, place, and time.     UC Treatments / Results  Labs (all labs ordered are listed, but only abnormal results are displayed) Labs Reviewed - No data to display  Lab Results  Component Value Date   HGBA1C 7.3 04/21/2017     EKG  EKG Interpretation None       Radiology No results found.  Procedures Procedures (including critical care time)  Medications Ordered in UC Medications - No data to display  Initial Impression / Assessment and Plan / UC Course  I have reviewed the triage vital signs and the nursing notes.  Pertinent labs & imaging results that were available during my care of the patient were reviewed by me and considered in my medical decision making (see chart for details).    Start antibiotics for possible dental abscess. Symptomatic treatment as needed. Discussed with patient symptoms can return if dental problem is not addressed. Follow up with dentist for further evaluation and treatment of  dental pain. Resources given. Return precautions given.   Patient requests stronger medications for dental pain.  She has tolerated tramadol in the past, will prescribe tramadol for breakthrough pain.  Final Clinical Impressions(s) / UC Diagnoses   Final diagnoses:  Pain, dental    ED Discharge Orders        Ordered    penicillin v potassium (VEETID) 500 MG tablet  4 times daily,   Status:  Discontinued     06/04/17 1756    meloxicam (MOBIC) 7.5 MG tablet  Daily,   Status:  Discontinued     06/04/17 1756    ondansetron (ZOFRAN) 4 MG tablet  Every 6 hours,   Status:  Discontinued     06/04/17 1756    traMADol (ULTRAM) 50 MG tablet  Every 12 hours PRN     06/04/17 1802    meloxicam (MOBIC) 7.5 MG tablet  Daily     06/04/17 1809    ondansetron (ZOFRAN) 4 MG tablet  Every 6 hours     06/04/17 1809    penicillin v potassium (VEETID) 500 MG tablet  4 times daily     06/04/17 1809       Controlled Substance Prescriptions [place] Controlled Substance Registry consulted? Yes, I have consulted the  Controlled Substances Registry for this patient, and feel the risk/benefit ratio today is favorable for proceeding with this prescription for a controlled substance.   Ok Edwards, PA-C 06/04/17 [phone]

## 2017-06-05 ENCOUNTER — Emergency Department (HOSPITAL_COMMUNITY): Payer: Medicare Other

## 2017-06-05 ENCOUNTER — Other Ambulatory Visit: Payer: Self-pay

## 2017-06-05 ENCOUNTER — Inpatient Hospital Stay (HOSPITAL_COMMUNITY)
Admission: EM | Admit: 2017-06-05 | Discharge: 2017-06-10 | DRG: 871 | Disposition: A | Discharge status: Home / Self Care | Payer: Medicare Other | Attending: Oncology | Admitting: Oncology

## 2017-06-05 DIAGNOSIS — I959 Hypotension, unspecified: Secondary | ICD-10-CM | POA: Diagnosis present

## 2017-06-05 DIAGNOSIS — K7200 Acute and subacute hepatic failure without coma: Secondary | ICD-10-CM | POA: Diagnosis not present

## 2017-06-05 DIAGNOSIS — Z96652 Presence of left artificial knee joint: Secondary | ICD-10-CM | POA: Diagnosis present

## 2017-06-05 DIAGNOSIS — K7589 Other specified inflammatory liver diseases: Secondary | ICD-10-CM | POA: Diagnosis not present

## 2017-06-05 DIAGNOSIS — R05 Cough: Secondary | ICD-10-CM

## 2017-06-05 DIAGNOSIS — E785 Hyperlipidemia, unspecified: Secondary | ICD-10-CM | POA: Diagnosis present

## 2017-06-05 DIAGNOSIS — N170 Acute kidney failure with tubular necrosis: Secondary | ICD-10-CM | POA: Diagnosis not present

## 2017-06-05 DIAGNOSIS — Z87891 Personal history of nicotine dependence: Secondary | ICD-10-CM

## 2017-06-05 DIAGNOSIS — K759 Inflammatory liver disease, unspecified: Secondary | ICD-10-CM

## 2017-06-05 DIAGNOSIS — Z79899 Other long term (current) drug therapy: Secondary | ICD-10-CM

## 2017-06-05 DIAGNOSIS — R296 Repeated falls: Secondary | ICD-10-CM | POA: Diagnosis present

## 2017-06-05 DIAGNOSIS — K760 Fatty (change of) liver, not elsewhere classified: Secondary | ICD-10-CM | POA: Diagnosis not present

## 2017-06-05 DIAGNOSIS — I110 Hypertensive heart disease with heart failure: Secondary | ICD-10-CM | POA: Diagnosis present

## 2017-06-05 DIAGNOSIS — I21A1 Myocardial infarction type 2: Secondary | ICD-10-CM | POA: Diagnosis not present

## 2017-06-05 DIAGNOSIS — Z89411 Acquired absence of right great toe: Secondary | ICD-10-CM

## 2017-06-05 DIAGNOSIS — D649 Anemia, unspecified: Secondary | ICD-10-CM | POA: Diagnosis present

## 2017-06-05 DIAGNOSIS — R945 Abnormal results of liver function studies: Secondary | ICD-10-CM

## 2017-06-05 DIAGNOSIS — N179 Acute kidney failure, unspecified: Secondary | ICD-10-CM | POA: Diagnosis not present

## 2017-06-05 DIAGNOSIS — R059 Cough, unspecified: Secondary | ICD-10-CM

## 2017-06-05 DIAGNOSIS — R404 Transient alteration of awareness: Secondary | ICD-10-CM | POA: Diagnosis not present

## 2017-06-05 DIAGNOSIS — Z8249 Family history of ischemic heart disease and other diseases of the circulatory system: Secondary | ICD-10-CM

## 2017-06-05 DIAGNOSIS — A419 Sepsis, unspecified organism: Secondary | ICD-10-CM | POA: Diagnosis not present

## 2017-06-05 DIAGNOSIS — L84 Corns and callosities: Secondary | ICD-10-CM | POA: Diagnosis present

## 2017-06-05 DIAGNOSIS — G43909 Migraine, unspecified, not intractable, without status migrainosus: Secondary | ICD-10-CM | POA: Diagnosis not present

## 2017-06-05 DIAGNOSIS — I5032 Chronic diastolic (congestive) heart failure: Secondary | ICD-10-CM | POA: Diagnosis not present

## 2017-06-05 DIAGNOSIS — R42 Dizziness and giddiness: Secondary | ICD-10-CM | POA: Diagnosis not present

## 2017-06-05 DIAGNOSIS — Z7982 Long term (current) use of aspirin: Secondary | ICD-10-CM

## 2017-06-05 DIAGNOSIS — Z888 Allergy status to other drugs, medicaments and biological substances status: Secondary | ICD-10-CM

## 2017-06-05 DIAGNOSIS — E118 Type 2 diabetes mellitus with unspecified complications: Secondary | ICD-10-CM | POA: Diagnosis present

## 2017-06-05 DIAGNOSIS — R34 Anuria and oliguria: Secondary | ICD-10-CM | POA: Diagnosis not present

## 2017-06-05 DIAGNOSIS — D696 Thrombocytopenia, unspecified: Secondary | ICD-10-CM | POA: Diagnosis present

## 2017-06-05 DIAGNOSIS — R7989 Other specified abnormal findings of blood chemistry: Secondary | ICD-10-CM

## 2017-06-05 DIAGNOSIS — E119 Type 2 diabetes mellitus without complications: Secondary | ICD-10-CM | POA: Diagnosis present

## 2017-06-05 DIAGNOSIS — Z8673 Personal history of transient ischemic attack (TIA), and cerebral infarction without residual deficits: Secondary | ICD-10-CM

## 2017-06-05 LAB — CBC WITH DIFFERENTIAL/PLATELET
BASOS PCT: 1 %
Basophils Absolute: 0 10*3/uL (ref 0.0–0.1)
Eosinophils Absolute: 0 10*3/uL (ref 0.0–0.7)
Eosinophils Relative: 0 %
HEMATOCRIT: 38.4 % (ref 36.0–46.0)
Hemoglobin: 12.3 g/dL (ref 12.0–15.0)
Lymphocytes Relative: 15 %
Lymphs Abs: 0.7 10*3/uL (ref 0.7–4.0)
MCH: 31.1 pg (ref 26.0–34.0)
MCHC: 32 g/dL (ref 30.0–36.0)
MCV: 97 fL (ref 78.0–100.0)
MONO ABS: 0.3 10*3/uL (ref 0.1–1.0)
MONOS PCT: 8 %
NEUTROS ABS: 3.3 10*3/uL (ref 1.7–7.7)
Neutrophils Relative %: 76 %
Platelets: 120 10*3/uL — ABNORMAL LOW (ref 150–400)
RBC: 3.96 MIL/uL (ref 3.87–5.11)
RDW: 15 % (ref 11.5–15.5)
WBC: 4.4 10*3/uL (ref 4.0–10.5)

## 2017-06-05 LAB — COMPREHENSIVE METABOLIC PANEL
ALBUMIN: 3.4 g/dL — AB (ref 3.5–5.0)
ALK PHOS: 151 U/L — AB (ref 38–126)
ALT: 723 U/L — AB (ref 14–54)
ANION GAP: 9 (ref 5–15)
AST: 1048 U/L — AB (ref 15–41)
BUN: 30 mg/dL — AB (ref 6–20)
CALCIUM: 8.7 mg/dL — AB (ref 8.9–10.3)
CO2: 17 mmol/L — AB (ref 22–32)
Chloride: 108 mmol/L (ref 101–111)
Creatinine, Ser: 2.86 mg/dL — ABNORMAL HIGH (ref 0.44–1.00)
GFR calc Af Amer: 19 mL/min — ABNORMAL LOW (ref >60)
GFR calc non Af Amer: 16 mL/min — ABNORMAL LOW (ref >60)
GLUCOSE: 73 mg/dL (ref 65–99)
Potassium: 4.4 mmol/L (ref 3.5–5.1)
SODIUM: 134 mmol/L — AB (ref 135–145)
Total Bilirubin: 1.4 mg/dL — ABNORMAL HIGH (ref 0.3–1.2)
Total Protein: 6.5 g/dL (ref 6.5–8.1)

## 2017-06-05 LAB — ACETAMINOPHEN LEVEL: Acetaminophen (Tylenol), Serum: <10 ug/mL — ABNORMAL LOW (ref 10–30)

## 2017-06-05 LAB — MONONUCLEOSIS SCREEN: MONO SCREEN: NEGATIVE

## 2017-06-05 LAB — I-STAT CG4 LACTIC ACID, ED
Lactic Acid, Venous: 1.08 mmol/L (ref 0.5–1.9)
Lactic Acid, Venous: 0.4 mmol/L — ABNORMAL LOW (ref 0.5–1.9)

## 2017-06-05 LAB — INFLUENZA PANEL BY PCR (TYPE A & B)
INFLAPCR: NEGATIVE
INFLBPCR: NEGATIVE

## 2017-06-05 LAB — PROTIME-INR
INR: 1.06
Prothrombin Time: 13.7 seconds (ref 11.4–15.2)

## 2017-06-05 MED ORDER — PIPERACILLIN-TAZOBACTAM 3.375 G IVPB
3.3750 g | Freq: Three times a day (TID) | INTRAVENOUS | Status: DC
  Administered 2017-06-06 – 2017-06-07 (×4): 3.375 g via INTRAVENOUS
  Filled 2017-06-05 (×5): qty 50

## 2017-06-05 MED ORDER — VANCOMYCIN HCL IN DEXTROSE 1-5 GM/200ML-% IV SOLN
1000.0000 mg | Freq: Once | INTRAVENOUS | Status: AC
  Administered 2017-06-05: 1000 mg via INTRAVENOUS
  Filled 2017-06-05: qty 200

## 2017-06-05 MED ORDER — SODIUM CHLORIDE 0.9 % IV SOLN
INTRAVENOUS | Status: AC
  Administered 2017-06-05: via INTRAVENOUS
  Administered 2017-06-06: 125 mL/h via INTRAVENOUS

## 2017-06-05 MED ORDER — VANCOMYCIN HCL 500 MG IV SOLR
500.0000 mg | Freq: Once | INTRAVENOUS | Status: AC
  Administered 2017-06-05: 500 mg via INTRAVENOUS
  Filled 2017-06-05: qty 500

## 2017-06-05 MED ORDER — SODIUM CHLORIDE 0.9 % IV BOLUS (SEPSIS)
1000.0000 mL | Freq: Once | INTRAVENOUS | Status: AC
  Administered 2017-06-05: 1000 mL via INTRAVENOUS

## 2017-06-05 MED ORDER — SODIUM CHLORIDE 0.9% FLUSH
3.0000 mL | Freq: Two times a day (BID) | INTRAVENOUS | Status: DC
  Administered 2017-06-05 – 2017-06-10 (×7): 3 mL via INTRAVENOUS

## 2017-06-05 MED ORDER — VANCOMYCIN HCL IN DEXTROSE 1-5 GM/200ML-% IV SOLN
1000.0000 mg | Freq: Two times a day (BID) | INTRAVENOUS | Status: DC
  Filled 2017-06-05 (×2): qty 200

## 2017-06-05 MED ORDER — PIPERACILLIN-TAZOBACTAM 3.375 G IVPB 30 MIN
3.3750 g | Freq: Once | INTRAVENOUS | Status: AC
  Administered 2017-06-05: 3.375 g via INTRAVENOUS
  Filled 2017-06-05: qty 50

## 2017-06-05 MED ORDER — SENNOSIDES-DOCUSATE SODIUM 8.6-50 MG PO TABS: 1.0000 | ORAL_TABLET | Freq: Every evening | ORAL | Status: DC | PRN

## 2017-06-05 MED ORDER — ACETAMINOPHEN 325 MG PO TABS: 650.0000 mg | ORAL_TABLET | Freq: Once | ORAL | Status: DC

## 2017-06-05 MED ORDER — ENOXAPARIN SODIUM 30 MG/0.3ML ~~LOC~~ SOLN
30.0000 mg | Freq: Every day | SUBCUTANEOUS | Status: DC
  Administered 2017-06-06 – 2017-06-10 (×5): 30 mg via SUBCUTANEOUS
  Filled 2017-06-05 (×5): qty 0.3

## 2017-06-05 NOTE — ED Notes (Signed)
Activated code sepsis  

## 2017-06-05 NOTE — ED Provider Notes (Signed)
[facility] EMERGENCY DEPARTMENT Provider Note   CSN: [id number] Arrival date & time: 06/05/17  1737     History   Chief Complaint Chief Complaint  Patient presents with  . Weakness    HPI [name] is a 66 y.o. female.  HPI  66 y.o. female with a hx of CHF, HTN, HLD, DM2, presents to the Emergency Department today due to hypotension. Pt called EMS due to weakness and fatigue. Pt states she could not even stand or walk. Upon EMS arrival, pt was found to by tachycardic with HR 937, BP 80 systolic. Pt denies pain. No CP/SOB/ABD pain. No N/V/D. No fevers. Does not recent diagnosis of dental abscess that she is currently on ABX (penicillin). No other symptoms noted   Past Medical History:  Diagnosis Date  . Allergy   . Anemia   . Arthritis    "left knee" (09/08/2015)  . Asthma   . Brachial plexus disorders   . CHF (congestive heart failure) ([place])   . Chronic pain    neck pain, headache, neuropathy  . COPD (chronic obstructive pulmonary disease) ([place])    SEES ONLY DR. PATEL   . Depression    "when my husband passed in 2013"  . GERD (gastroesophageal reflux disease)   . Hypertension   . Hypertriglyceridemia   . Migraine    "monthly" (09/08/2015)  . Neuromuscular disorder (HCC)    neuropathy  . Puncture wound of foot, right 05/17/2012   Tetanus shot 3 yrs ago at [facility] in [place], per pt report   . Stroke ([place])    TIAS    IN [place]   4 YRS AGO   . Type II diabetes mellitus ([facility]) 2010   diagnosed around 2010, only ever on metformin    Patient Active Problem List   Diagnosis Date Noted  . Right foot pain 04/22/2017  . Health maintenance examination 01/21/2017  . Blister (nonthermal) of right ring finger, initial encounter 01/21/2017  . History of total knee replacement, 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  . Neck muscle strain 08/19/2016  . Dog bite, hand, left, initial encounter 08/19/2016  . Onychomycosis of multiple toenails with type 2 diabetes  mellitus ([place]) 11/04/2015  . Skin lesions 11/04/2015  . Screening for colon cancer 11/04/2015  . Orthostatic syncope   . Sinusitis 06/19/2015  . Obesity (BMI 30.0-34.9) 05/27/2015  . Fatigue 05/11/2015  . Leg ulcer, left ([place]) 03/05/2015  . Recurrent falls 03/05/2015  . Primary osteoarthritis of left knee 09/22/2014  . Esophageal reflux 07/18/2014  . Hyperlipidemia associated with type 2 diabetes mellitus ([place]) 02/26/2014  . Tobacco use disorder 07/22/2013  . Headache 06/28/2013  . Diabetic peripheral neuropathy associated with type 2 diabetes mellitus ([place]) 06/14/2013  . Hx-TIA (transient ischemic attack) 03/10/2013  . Mild depression ([place]) 12/13/2012  . Diabetes mellitus type 2, controlled, with complications ([place]) 16/96/7893  . Hypertension 05/13/2012    Past Surgical History:  Procedure Laterality Date  . 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 ARTHRODESIS METATARSAL     RIGHT 5 TH   . BILATERAL SALPINGOOPHORECTOMY Bilateral 1986   "after hemtoma evacuations; had to cut me open"  . CARPAL TUNNEL RELEASE Bilateral   . COLONOSCOPY     10 + yrs ago- pt unsure- was in [place]- pt states  MD will not send records but colon was normal per pt.   Marland Kitchen  DILATION AND CURETTAGE OF UTERUS  ~ 1982   S/P miscarriage  . HEMATOMA EVACUATION Right 1986 x 2   "OVARY; w/in 1 wk after hysterectomy"  . KNEE ARTHROSCOPY     LEFT  . LAPAROSCOPIC CHOLECYSTECTOMY    . SHOULDER ARTHROSCOPY W/ ROTATOR CUFF REPAIR Bilateral   . TONSILLECTOMY    . TOTAL KNEE ARTHROPLASTY Left 09/30/2016   Procedure: LEFT TOTAL KNEE ARTHROPLASTY;  Surgeon: Netta Cedars, MD;  Location: [facility];  Service: Orthopedics;  Laterality: Left;  . TUBAL LIGATION    . VAGINAL HYSTERECTOMY  1986    OB History    No data available       Home Medications    Prior to Admission medications     Medication Sig Start Date End Date Taking? Authorizing Provider  albuterol (PROVENTIL HFA;VENTOLIN HFA) 108 (90 Base) MCG/ACT inhaler Inhale 1-2 puffs into the lungs every 6 (six) hours as needed for wheezing or shortness of breath. 02/10/17   Lucious Groves, DO  amLODipine (NORVASC) 2.5 MG tablet Take 1 tablet (2.5 mg total) by mouth daily. 02/10/17 02/10/18  Lucious Groves, DO  aspirin 81 MG tablet Take 81 mg by mouth every morning.     [provider]  aspirin-acetaminophen-caffeine (EXCEDRIN MIGRAINE) [phone] MG tablet Take 2 tablets by mouth every 8 (eight) hours as needed for headache.     [provider]  Blood Glucose Monitoring Suppl (ONETOUCH VERIO) w/Device KIT 1 each by Does not apply route 3 (three) times daily. 06/03/16   Sid Falcon, MD  fluticasone (FLONASE) 50 MCG/ACT nasal spray USE 2 SPRAYS IN [facility] NOSTRIL DAILY 11/14/16   Riccardo Dubin, MD  gabapentin (NEURONTIN) 600 MG tablet Take 2 tablets (1,200 mg total) by mouth 3 (three) times daily. 05/09/17 06/08/17  Lars Mage, MD  glipiZIDE (GLUCOTROL XL) 5 MG 24 hr tablet Take 1 tablet (5 mg total) by mouth daily with breakfast. 02/10/17   Lucious Groves, DO  glucose blood (ONETOUCH VERIO) test strip Check blood sugar 3x/day 06/03/16   Sid Falcon, MD  ibuprofen (ADVIL) 200 MG tablet Take 4 tablets (800 mg total) by mouth 2 (two) times daily as needed. 05/11/17 06/10/17  Lars Mage, MD  lisinopril (PRINIVIL,ZESTRIL) 40 MG tablet Take 1 tablet (40 mg total) by mouth daily. 05/09/17 07/08/17  Lars Mage, MD  meloxicam (MOBIC) 7.5 MG tablet Take 1 tablet (7.5 mg total) by mouth daily. 06/04/17   Ok Edwards, PA-C  metFORMIN (GLUCOPHAGE-XR) 750 MG 24 hr tablet take 2 tablets by mouth once daily WITH BREAKFAST 02/07/17   Joni Reining C, DO  ondansetron (ZOFRAN) 4 MG tablet Take 1 tablet (4 mg total) by mouth every 6 (six) hours. 06/04/17   Tasia Catchings, Amy V, PA-C  ONETOUCH DELICA LANCETS FINE MISC Check blood  sugar up to 3 times a day 05/31/16   Sid Falcon, MD  pantoprazole (PROTONIX) 20 MG tablet Take 1 tablet (20 mg total) by mouth daily as needed. 02/10/17   Lucious Groves, DO  penicillin v potassium (VEETID) 500 MG tablet Take 1 tablet (500 mg total) by mouth 4 (four) times daily for 7 days. 06/04/17 06/11/17  Tasia Catchings, Amy V, PA-C  rosuvastatin (CRESTOR) 40 MG tablet Take 1 tablet (40 mg total) by mouth daily. 02/08/17   Bartholomew Crews, MD  sertraline (ZOLOFT) 50 MG tablet Take 1 tablet (50 mg total) by mouth daily. 05/09/17 06/08/17  Lars Mage, MD  sitaGLIPtin (JANUVIA) 100  MG tablet Take 1 tablet (100 mg total) daily by mouth. 04/26/17   Chundi, Vahini, MD  traMADol (ULTRAM) 50 MG tablet Take 1 tablet (50 mg total) by mouth every 12 (twelve) hours as needed for severe pain. 06/04/17   Ok Edwards, PA-C    Family History Family History  Problem Relation Age of Onset  . Hyperlipidemia Mother   . Heart attack Father 90  . Hypertension Father   . Cancer Paternal Grandfather        Lung cancer  . Cancer Maternal Grandmother   . Heart attack Maternal Grandmother   . Colon cancer Neg Hx   . Colon polyps Neg Hx   . Esophageal cancer Neg Hx   . Rectal cancer Neg Hx   . Stomach cancer Neg Hx     Social History Social History   Tobacco Use  . Smoking status: Former Smoker    Packs/day: 0.10    Years: 47.00    Pack years: 4.70    Types: Cigarettes    Last attempt to quit: 08/11/2016    Years since quitting: 0.8  . Smokeless tobacco: Never Used  . Tobacco comment:  2 cigs per day  Substance Use Topics  . Alcohol use: No    Alcohol/week: 0.0 oz  . Drug use: No     Allergies   Anesthesia s-i-60; Flexeril [cyclobenzaprine]; and Soma [carisoprodol]   Review of Systems Review of Systems ROS reviewed and all are negative for acute change except as noted in the HPI.  Physical Exam Updated Vital Signs BP (!) 81/48 (BP Location: Left Arm)   Pulse 87   Temp 99.5 F (37.5 C)  (Oral)   Resp (!) 22   SpO2 91%   Physical Exam  Constitutional: She is oriented to person, place, and time. She appears well-developed and well-nourished. No distress.  Dry mucous membranes. Pale.   HENT:  Head: Normocephalic and atraumatic.  Right Ear: Tympanic membrane, external ear and ear canal normal.  Left Ear: Tympanic membrane, external ear and ear canal normal.  Nose: Nose normal.  Mouth/Throat: Uvula is midline, oropharynx is clear and moist and mucous membranes are normal. No trismus in the jaw. Dental caries present. No dental abscesses. No oropharyngeal exudate, posterior oropharyngeal erythema or tonsillar abscesses.  Small dental infection without abscess left lower canine. No ludwig's or palpable soft tissue infection. No fluctuance   Eyes: EOM are normal. Pupils are equal, round, and reactive to light.  Neck: Normal range of motion. Neck supple. No tracheal deviation present.  Cardiovascular: Normal rate, regular rhythm, S1 normal, S2 normal, normal heart sounds, intact distal pulses and normal pulses.  Pulmonary/Chest: Effort normal and breath sounds normal. No respiratory distress. She has no decreased breath sounds. She has no wheezes. She has no rhonchi. She has no rales.  Abdominal: Normal appearance and bowel sounds are normal. There is no tenderness.  Musculoskeletal: Normal range of motion.  Neurological: She is alert and oriented to person, place, and time.  Skin: Skin is warm and dry.  Psychiatric: She has a normal mood and affect. Her speech is normal and behavior is normal. Thought content normal.  Nursing note and vitals reviewed.  ED Treatments / Results  Labs (all labs ordered are listed, but only abnormal results are displayed) Labs Reviewed  COMPREHENSIVE METABOLIC PANEL - Abnormal; Notable for the following components:      Result Value   Sodium 134 (*)    CO2 17 (*)  BUN 30 (*)    Creatinine, Ser 2.86 (*)    Calcium 8.7 (*)    Albumin 3.4 (*)     AST 1,048 (*)    ALT 723 (*)    Alkaline Phosphatase 151 (*)    Total Bilirubin 1.4 (*)    GFR calc non Af Amer 16 (*)    GFR calc Af Amer 19 (*)    All other components within normal limits  CBC WITH DIFFERENTIAL/PLATELET - Abnormal; Notable for the following components:   Platelets 120 (*)    All other components within normal limits  CULTURE, BLOOD (ROUTINE X 2)  CULTURE, BLOOD (ROUTINE X 2)  URINE CULTURE  URINALYSIS, ROUTINE W REFLEX MICROSCOPIC  INFLUENZA PANEL BY PCR (TYPE A & B)  ACETAMINOPHEN LEVEL  PROTIME-INR  HEPATITIS PANEL, ACUTE  MONONUCLEOSIS SCREEN  EPSTEIN-BARR VIRUS VCA, IGG  EPSTEIN-BARR VIRUS VCA, IGM  RSV(RESPIRATORY SYNCYTIAL VIRUS) AB, BLOOD  CMV IGM  I-STAT CG4 LACTIC ACID, ED  I-STAT CG4 LACTIC ACID, ED    EKG  EKG Interpretation None       Radiology Dg Chest Port 1 View  Result Date: 06/05/2017 CLINICAL DATA:  Sepsis and dental abscess. EXAM: PORTABLE CHEST 1 VIEW COMPARISON:  10/20/2015 FINDINGS: The heart size and mediastinal contours are within normal limits. There is no evidence of pulmonary edema, consolidation, pneumothorax, nodule or pleural fluid. The visualized skeletal structures are unremarkable. IMPRESSION: No active disease. Electronically Signed   By: Aletta Edouard M.D.   On: 06/05/2017 18:28    Procedures Procedures (including critical care time)  Medications Ordered in ED Medications  vancomycin (VANCOCIN) IVPB 1000 mg/200 mL premix (1,000 mg Intravenous New Bag/Given 06/05/17 1857)  sodium chloride 0.9 % bolus 1,000 mL (not administered)    And  sodium chloride 0.9 % bolus 1,000 mL (1,000 mLs Intravenous New Bag/Given 06/05/17 1827)    And  sodium chloride 0.9 % bolus 1,000 mL (1,000 mLs Intravenous New Bag/Given 06/05/17 1822)  piperacillin-tazobactam (ZOSYN) IVPB 3.375 g (not administered)  vancomycin (VANCOCIN) IVPB 1000 mg/200 mL premix (not administered)  vancomycin (VANCOCIN) 500 mg in sodium chloride 0.9 %  100 mL IVPB (not administered)  piperacillin-tazobactam (ZOSYN) IVPB 3.375 g (0 g Intravenous Stopped 06/05/17 1854)     Initial Impression / Assessment and Plan / ED Course  I have reviewed the triage vital signs and the nursing notes.  Pertinent labs & imaging results that were available during my care of the patient were reviewed by me and considered in my medical decision making (see chart for details).  Final Clinical Impressions(s) / ED Diagnoses  {I have reviewed and evaluated the relevant laboratory values. {I have reviewed and evaluated the relevant imaging studies. {I have interpreted the relevant EKG. {I have reviewed the relevant previous healthcare records. {I have reviewed EMS Documentation. {I obtained HPI from historian. {Patient discussed with supervising physician.  ED Course:  Assessment: Pt is a 66 y.o. female with a hx of CHF, HTN, HLD, DM2, presents to the Emergency Department today due to hypotension. Pt called EMS due to weakness and fatigue. Pt states she could not even stand or walk. Upon EMS arrival, pt was found to by tachycardic with HR 882, BP 80 systolic. Pt denies pain. No CP/SOB/ABD pain. No N/V/D. No fevers. Does not recent diagnosis of dental abscess that she is currently on ABX (penicillin). On exam, pt in Nontoxic/nonseptic appearing. VS with stable HR. BP 82/55. O2 saturation 92% on RA. Rectal Temp 101.6F.  Lungs CTA. Heart RRR. Abdomen nontender soft. Dental infection not impressive. Mild canine infection. No palpable abscess or deep tissue infection. No ludwigs. iStat Lactate 1.08. WBC 4.4. CMP with creatinine 2.86. AST 1,048. ALT 723. Bili 1.4. Albumin 3.4. Likely ischemic hepatitis 2/2 hypoperfusion. Code Sepsis initiated. Made NPO. Given vanc/zosyn abx to cover unknown source. NS given as per Sepsis protocol (3L). CXR unremarkable. I suspect thy hypotension is likely due to hypovolemia due to decrease PO intake. Plan is to [place]. Repeat sepsis assessment  completed.  Disposition/Plan:  Admit Pt acknowledges and agrees with plan  Supervising Physician Drenda Freeze, MD  Final diagnoses:  Hypotension, unspecified hypotension type  Elevated LFTs  AKI (acute kidney injury) [facility])  Ischemic hepatitis    ED Discharge Orders    None       Shary Decamp, PA-C 06/05/17 2020    Drenda Freeze, MD 06/08/17 [phone]

## 2017-06-05 NOTE — ED Notes (Signed)
Patient transported to Ultrasound 

## 2017-06-05 NOTE — Progress Notes (Signed)
Pharmacy Antibiotic Note  Shell Blanchette is a 66 y.o. female admitted on 06/05/2017 with sepsis.  Pharmacy has been consulted for vancomycin and zosyn dosing.   Recent diagnosis of dental abscess on penicillin PTA, Tmax 101.4, WBC wnl, and pt has had normal renal function in past with last SCr of 1.00 in August.  Plan: Zosyn 3.375 g IV q 8 h Vancomycin 1500 mg loading dose x 1, then Vancomycin 1000 mg IV q 12 h F/u current renal function, LOT, clinical progression, cultures  Height: 5\' 6"  (167.6 cm) Weight: 182 lb (82.6 kg) IBW/kg (Calculated) : 59.3  Temp (24hrs), Avg:100.5 F (38.1 C), Min:99.5 F (37.5 C), Max:101.4 F (38.6 C)  Recent Labs  Lab 06/05/17 1812 06/05/17 1826  WBC 4.4  --   LATICACIDVEN  --  1.08    CrCl cannot be calculated (Patient's most recent lab result is older than the maximum 21 days allowed.).    Allergies  Allergen Reactions  . Anesthesia S-I-60 Other (See Comments)    Hallucinations.  Yvette Rack [Cyclobenzaprine] Other (See Comments)    Prolonged QTc to 571, tachycardia  . Soma [Carisoprodol] Itching and Rash    Antimicrobials this admission: vanc 12/24>> Zosyn 12/24>>  Dose adjustments this admission: n/a  Microbiology results: BCx 12/24>> sent UCx 12/24>>sent  Bertis Ruddy, PharmD Pharmacy Resident Pager #: [phone] 06/05/2017 6:52 PM

## 2017-06-05 NOTE — H&P (Signed)
Date: 06/05/2017               Patient Name:  [name] MRN: [id number]  DOB: Jul 02, 1950 Age / Sex: 66 y.o., female   PCP: Lars Mage, MD         Medical Service: Internal Medicine Teaching Service         Attending Physician: Dr. Darl Householder Lujean Rave, MD    First Contact: Dr. Aggie Hacker Pager: [phone]  Second Contact: Dr. Jari Favre Pager: [phone]       After Hours (After 5p/  First Contact Pager: [phone]  weekends / holidays): Second Contact Pager: [phone]   Chief Complaint: Generalized weakness  History of Present Illness: Ms. Shedlock is a 66 yo F with a past medical history of HTN, DM, asthma who presented to the ED with complaints of generalized weakness, light-headedness.   She reports being in her usual state of health until yesterday when her tooth became painful. She attempted to use OTC orajel without relief and presented to the ED where she was prescribed penicillin, meloxicam, tramadol. This tooth has been broken for a long period and has not previously caused issues. Earlier today she began to feel progressive generalized fatigue and weakness as well as light-headedness when she attempted to move. She noted an episode of nausea last night (none at time of history), itchiness starting today while in ED. No other associated symptoms reported including abdominal pain, v/d, cough, sore throat, subjective fever at home, constipation, dysuria, urinary frequency, GU sx, recent weight loss. Denied recent illness or sick contacts. She has not travelled recently, denies IV drug use and is not sexually active.     In the ED, Temp of 101.4 rectally, HR 82, BP 82/55, 92% on RA. Code sepsis was activated and she received 3 L IVF, blood cultures drawn, and started on broad spectrum antibiotics. Labs showed WBC 4.4, BUN 30, Cr 2.86, AST 1048, ALT 723, T. Bili 1.4, Alk Phos 151. She was admitted for further management.   Meds:  Current Facility-Administered Medications for the  06/05/17 encounter [facility] Encounter)  Medication  . 0.9 %  sodium chloride infusion   Current Meds  Medication Sig  . albuterol (PROVENTIL HFA;VENTOLIN HFA) 108 (90 Base) MCG/ACT inhaler Inhale 1-2 puffs into the lungs every 6 (six) hours as needed for wheezing or shortness of breath.  Marland Kitchen amLODipine (NORVASC) 2.5 MG tablet Take 1 tablet (2.5 mg total) by mouth daily. (Patient taking differently: Take 2.5 mg by mouth at bedtime. )  . aspirin 81 MG tablet Take 81 mg by mouth every morning.   Marland Kitchen aspirin-acetaminophen-caffeine (EXCEDRIN MIGRAINE) 250-250-65 MG tablet Take 2 tablets by mouth every 8 (eight) hours as needed for headache.   . gabapentin (NEURONTIN) 600 MG tablet Take 2 tablets (1,200 mg total) by mouth 3 (three) times daily.  Marland Kitchen glipiZIDE (GLUCOTROL XL) 5 MG 24 hr tablet Take 1 tablet (5 mg total) by mouth daily with breakfast. (Patient taking differently: Take 5 mg by mouth at bedtime. )  . ibuprofen (ADVIL) 200 MG tablet Take 4 tablets (800 mg total) by mouth 2 (two) times daily as needed.  Marland Kitchen lisinopril (PRINIVIL,ZESTRIL) 40 MG tablet Take 1 tablet (40 mg total) by mouth daily.  . meloxicam (MOBIC) 7.5 MG tablet Take 1 tablet (7.5 mg total) by mouth daily.  . metFORMIN (GLUCOPHAGE-XR) 750 MG 24 hr tablet take 2 tablets by mouth once daily WITH BREAKFAST (Patient taking differently: Take 1,500 mg by mouth daily with  breakfast. take 2 tablets by mouth once daily WITH BREAKFAST)  . pantoprazole (PROTONIX) 20 MG tablet Take 1 tablet (20 mg total) by mouth daily as needed.  . penicillin v potassium (VEETID) 500 MG tablet Take 1 tablet (500 mg total) by mouth 4 (four) times daily for 7 days.  . rosuvastatin (CRESTOR) 40 MG tablet Take 1 tablet (40 mg total) by mouth daily.  . sertraline (ZOLOFT) 50 MG tablet Take 1 tablet (50 mg total) by mouth daily.  . sitaGLIPtin (JANUVIA) 100 MG tablet Take 1 tablet (100 mg total) daily by mouth. (Patient taking differently: Take 100 mg by mouth at  bedtime. )  . traMADol (ULTRAM) 50 MG tablet Take 1 tablet (50 mg total) by mouth every 12 (twelve) hours as needed for severe pain.     Allergies: Allergies as of 06/05/2017 - Review Complete 06/04/2017  Allergen Reaction Noted  . Anesthesia s-i-60 Other (See Comments) 01/20/2017  . Flexeril [cyclobenzaprine] Other (See Comments) 08/12/2012  . Soma [carisoprodol] Itching and Rash 05/13/2012   Past Medical History:  Diagnosis Date  . Allergy   . Anemia   . Arthritis    "left knee" (09/08/2015)  . Asthma   . Brachial plexus disorders   . CHF (congestive heart failure) ([place])   . Chronic pain    neck pain, headache, neuropathy  . COPD (chronic obstructive pulmonary disease) ([place])    SEES ONLY DR. PATEL   . Depression    "when my husband passed in 2013"  . GERD (gastroesophageal reflux disease)   . Hypertension   . Hypertriglyceridemia   . Migraine    "monthly" (09/08/2015)  . Neuromuscular disorder ([facility])    neuropathy  . Puncture wound of foot, right 05/17/2012   Tetanus shot 3 yrs ago at [facility] in [place], per pt report   . Stroke ([place])    TIAS    IN [place]   4 YRS AGO   . Type II diabetes mellitus ([place]) 2010   diagnosed around 2010, only ever on metformin    Family History:  Family History  Problem Relation Age of Onset  . Hyperlipidemia Mother   . Heart attack Father 19  . Hypertension Father   . Cancer Paternal Grandfather        Lung cancer  . Cancer Maternal Grandmother   . Heart attack Maternal Grandmother   . Colon cancer Neg Hx   . Colon polyps Neg Hx   . Esophageal cancer Neg Hx   . Rectal cancer Neg Hx   . Stomach cancer Neg Hx      Social History:  Social History   Tobacco Use  . Smoking status: Former Smoker    Packs/day: 0.10    Years: 47.00    Pack years: 4.70    Types: Cigarettes    Last attempt to quit: 08/11/2016    Years since quitting: 0.8  . Smokeless tobacco: Never Used  . Tobacco comment:  2 cigs per day  Substance Use  Topics  . Alcohol use: No    Alcohol/week: 0.0 oz  . Drug use: No  Denies alcohol use, denies IV drug use, not sexually active currently.    Review of Systems: A complete ROS was negative except as per HPI.   Physical Exam: Blood pressure (!) 88/49, pulse 83, temperature (!) 101.4 F (38.6 C), temperature source Rectal, resp. rate 20, height 5' 6"  (1.676 m), weight 182 lb (82.6 kg), SpO2 98 %. General: Resting in bed  comfortably, no acute distress Head: Normocephalic, atraumatic  Eyes: PERRL, no scleral icterus  ENT: Slightly dry mucus membranes, no exudate appreciated, uvula midline. Widespread caries and to L lower canine with no obvious swelling, discharge, or signs of abscess  CV: Faint heart sounds, no murmur appreciated  Resp: Coarse/rhoncherous breath sounds primarily to R base, otherwise clear, normal work of breathing, no distress  Abd: Soft, +BS, obese, no tenderness to palpation, no rebound/guarding, no hepatosplenomegaly appreciated  Extr: No LE edema  Neuro: Alert and oriented x3, tired but easily maintains wakefulness,  Skin: Warm, dry    CXR: personally reviewed my interpretation is no opacities, no effusions, no acute process appreciated.   Assessment & Plan by Problem:  Sepsis Pt presented with hypotension, as well as fever to 101.4. LA 1.08, no white count or obvious source of infection apart from dental pain with no other localizing sx reported. Code sepsis was activated and pt received appropriate IV hydration, blood cultures drawn, and she was started on broad spectrum antibiotics. Procalcitonin is elevated at 2.13 favoring a systemic infection. CXR is clear though she did have coarse breath sounds to right base, area of dental pain is not impressive for infection source.   --Tele, monitor vital signs and fever curve --Cont Vanc/Zosyn for now  --Influenza panel --Rpt CXR in am  --U/A      Transaminitis Pt also has marked elevation in AST (1048) and ALT (723),  previously normal LFTs in May 2017. She does not complain of any RUQ pain, n/v. Acetaminophen level normal. Hepatitis panel is pending, labs for causes of acute infection that can be associated with transaminitis have also been collected including EBV, CMV. She denies recent travel or overt risk factors for hepatitis infections. Hypoperfusion and ischemic insult is a possible etiology, though may expect LA to be higher in that case. Other causes including statin induced (on med list) lower down on differential given this degree of LFT elevation, penicillin not commonly associated with DILI, labs not c/w with cholestatic injury.  --F/u RUQ [place]  --F/u Hepatitis panel, CMV, EBV serologies  --Rpt CMP    Acute Kidney Injury Cr on presentation elevated to 2.86 compared to baseline of ~1, likely to be pre-renal caused by hypovolemia with decreased PO intake, as well as hypotension. She has received IVF, anticipate this will lead to improvement in renal function.  --I/Os, avoid nephrotoxins --Hold home metformin, lisinopril, meloxicam   Troponemia Mild elevation of troponin on admission labs at 0.04. No complaints of chest pain, no changes on EKG. Likely demand ischemia in setting of acute infection, hypotension.  --Trend troponin    H/o Hypertension On home lisinopril and amlodipine, will hold anti-hypertensives given hypotension and septic presentation.    H/o Type 2 DM Last A1c 7.3 in Nov '18, on home glipizide, metformin, sitagliptin.  --SSI  Dispo: Admit patient to Observation with expected length of stay less than 2 midnights.  Signed: Tawny Asal, MD 06/05/2017, 8:47 PM  Pager: [phone]

## 2017-06-05 NOTE — ED Triage Notes (Signed)
Patient has had a dental abscess for several days and is on penicillin for it. Today patient felt weak and could not stand or walk by herself.

## 2017-06-06 ENCOUNTER — Observation Stay (HOSPITAL_COMMUNITY): Payer: Medicare Other

## 2017-06-06 ENCOUNTER — Inpatient Hospital Stay (HOSPITAL_COMMUNITY): Payer: Medicare Other

## 2017-06-06 ENCOUNTER — Encounter (HOSPITAL_COMMUNITY): Payer: Self-pay

## 2017-06-06 ENCOUNTER — Other Ambulatory Visit (HOSPITAL_COMMUNITY): Payer: BLUE CROSS/BLUE SHIELD

## 2017-06-06 DIAGNOSIS — I959 Hypotension, unspecified: Secondary | ICD-10-CM | POA: Diagnosis present

## 2017-06-06 DIAGNOSIS — D649 Anemia, unspecified: Secondary | ICD-10-CM | POA: Diagnosis not present

## 2017-06-06 DIAGNOSIS — R296 Repeated falls: Secondary | ICD-10-CM | POA: Diagnosis present

## 2017-06-06 DIAGNOSIS — Z79899 Other long term (current) drug therapy: Secondary | ICD-10-CM | POA: Diagnosis not present

## 2017-06-06 DIAGNOSIS — Z8673 Personal history of transient ischemic attack (TIA), and cerebral infarction without residual deficits: Secondary | ICD-10-CM | POA: Diagnosis not present

## 2017-06-06 DIAGNOSIS — K7200 Acute and subacute hepatic failure without coma: Secondary | ICD-10-CM | POA: Diagnosis present

## 2017-06-06 DIAGNOSIS — Z87891 Personal history of nicotine dependence: Secondary | ICD-10-CM | POA: Diagnosis not present

## 2017-06-06 DIAGNOSIS — I21A1 Myocardial infarction type 2: Secondary | ICD-10-CM | POA: Diagnosis not present

## 2017-06-06 DIAGNOSIS — N179 Acute kidney failure, unspecified: Secondary | ICD-10-CM

## 2017-06-06 DIAGNOSIS — R6511 Systemic inflammatory response syndrome (SIRS) of non-infectious origin with acute organ dysfunction: Secondary | ICD-10-CM | POA: Diagnosis not present

## 2017-06-06 DIAGNOSIS — K0889 Other specified disorders of teeth and supporting structures: Secondary | ICD-10-CM | POA: Diagnosis not present

## 2017-06-06 DIAGNOSIS — Z90710 Acquired absence of both cervix and uterus: Secondary | ICD-10-CM | POA: Diagnosis not present

## 2017-06-06 DIAGNOSIS — R34 Anuria and oliguria: Secondary | ICD-10-CM | POA: Diagnosis present

## 2017-06-06 DIAGNOSIS — Z7984 Long term (current) use of oral hypoglycemic drugs: Secondary | ICD-10-CM

## 2017-06-06 DIAGNOSIS — I5032 Chronic diastolic (congestive) heart failure: Secondary | ICD-10-CM | POA: Diagnosis present

## 2017-06-06 DIAGNOSIS — Z884 Allergy status to anesthetic agent status: Secondary | ICD-10-CM | POA: Diagnosis not present

## 2017-06-06 DIAGNOSIS — Z794 Long term (current) use of insulin: Secondary | ICD-10-CM | POA: Diagnosis not present

## 2017-06-06 DIAGNOSIS — J45909 Unspecified asthma, uncomplicated: Secondary | ICD-10-CM

## 2017-06-06 DIAGNOSIS — A419 Sepsis, unspecified organism: Secondary | ICD-10-CM | POA: Diagnosis not present

## 2017-06-06 DIAGNOSIS — R239 Unspecified skin changes: Secondary | ICD-10-CM

## 2017-06-06 DIAGNOSIS — R51 Headache: Secondary | ICD-10-CM | POA: Diagnosis not present

## 2017-06-06 DIAGNOSIS — Z96652 Presence of left artificial knee joint: Secondary | ICD-10-CM | POA: Diagnosis present

## 2017-06-06 DIAGNOSIS — B179 Acute viral hepatitis, unspecified: Secondary | ICD-10-CM | POA: Diagnosis not present

## 2017-06-06 DIAGNOSIS — L719 Rosacea, unspecified: Secondary | ICD-10-CM | POA: Diagnosis not present

## 2017-06-06 DIAGNOSIS — I5030 Unspecified diastolic (congestive) heart failure: Secondary | ICD-10-CM | POA: Diagnosis not present

## 2017-06-06 DIAGNOSIS — L97409 Non-pressure chronic ulcer of unspecified heel and midfoot with unspecified severity: Secondary | ICD-10-CM | POA: Diagnosis not present

## 2017-06-06 DIAGNOSIS — D696 Thrombocytopenia, unspecified: Secondary | ICD-10-CM | POA: Diagnosis present

## 2017-06-06 DIAGNOSIS — Z888 Allergy status to other drugs, medicaments and biological substances status: Secondary | ICD-10-CM | POA: Diagnosis not present

## 2017-06-06 DIAGNOSIS — K7589 Other specified inflammatory liver diseases: Secondary | ICD-10-CM | POA: Diagnosis not present

## 2017-06-06 DIAGNOSIS — E785 Hyperlipidemia, unspecified: Secondary | ICD-10-CM | POA: Diagnosis not present

## 2017-06-06 DIAGNOSIS — N281 Cyst of kidney, acquired: Secondary | ICD-10-CM | POA: Diagnosis not present

## 2017-06-06 DIAGNOSIS — I10 Essential (primary) hypertension: Secondary | ICD-10-CM | POA: Diagnosis not present

## 2017-06-06 DIAGNOSIS — L84 Corns and callosities: Secondary | ICD-10-CM | POA: Diagnosis present

## 2017-06-06 DIAGNOSIS — I110 Hypertensive heart disease with heart failure: Secondary | ICD-10-CM | POA: Diagnosis present

## 2017-06-06 DIAGNOSIS — Z8249 Family history of ischemic heart disease and other diseases of the circulatory system: Secondary | ICD-10-CM

## 2017-06-06 DIAGNOSIS — Z7982 Long term (current) use of aspirin: Secondary | ICD-10-CM | POA: Diagnosis not present

## 2017-06-06 DIAGNOSIS — E119 Type 2 diabetes mellitus without complications: Secondary | ICD-10-CM | POA: Diagnosis not present

## 2017-06-06 DIAGNOSIS — G43909 Migraine, unspecified, not intractable, without status migrainosus: Secondary | ICD-10-CM | POA: Diagnosis present

## 2017-06-06 DIAGNOSIS — Z79891 Long term (current) use of opiate analgesic: Secondary | ICD-10-CM

## 2017-06-06 DIAGNOSIS — N170 Acute kidney failure with tubular necrosis: Secondary | ICD-10-CM | POA: Diagnosis present

## 2017-06-06 DIAGNOSIS — Z89411 Acquired absence of right great toe: Secondary | ICD-10-CM | POA: Diagnosis not present

## 2017-06-06 DIAGNOSIS — K041 Necrosis of pulp: Secondary | ICD-10-CM | POA: Diagnosis not present

## 2017-06-06 DIAGNOSIS — Z791 Long term (current) use of non-steroidal anti-inflammatories (NSAID): Secondary | ICD-10-CM

## 2017-06-06 DIAGNOSIS — R05 Cough: Secondary | ICD-10-CM | POA: Diagnosis not present

## 2017-06-06 DIAGNOSIS — E669 Obesity, unspecified: Secondary | ICD-10-CM | POA: Diagnosis not present

## 2017-06-06 DIAGNOSIS — R7989 Other specified abnormal findings of blood chemistry: Secondary | ICD-10-CM | POA: Diagnosis not present

## 2017-06-06 LAB — CBC
HCT: 35.2 % — ABNORMAL LOW (ref 36.0–46.0)
Hemoglobin: 11 g/dL — ABNORMAL LOW (ref 12.0–15.0)
MCH: 30.4 pg (ref 26.0–34.0)
MCHC: 31.3 g/dL (ref 30.0–36.0)
MCV: 97.2 fL (ref 78.0–100.0)
PLATELETS: 103 10*3/uL — AB (ref 150–400)
RBC: 3.62 MIL/uL — ABNORMAL LOW (ref 3.87–5.11)
RDW: 14.9 % (ref 11.5–15.5)
WBC: 5.6 10*3/uL (ref 4.0–10.5)

## 2017-06-06 LAB — ECHOCARDIOGRAM COMPLETE
CHL CUP MV DEC (S): 225
E decel time: 225 msec
E/e' ratio: 21.6
FS: 44 % (ref 28–44)
HEIGHTINCHES: 66 in
IV/PV OW: 1.11
LA diam end sys: 36 mm
LADIAMINDEX: 1.75 cm/m2
LASIZE: 36 mm
LAVOL: 47.5 mL
LAVOLA4C: 62.6 mL
LAVOLIN: 23.1 mL/m2
LDCA: 3.14 cm2
LV E/e'average: 21.6
LV TDI E'LATERAL: 5
LV e' LATERAL: 5 cm/s
LVEEMED: 21.6
LVOT diameter: 20 mm
Lateral S' vel: 14.7 cm/s
MV Peak grad: 5 mmHg
MVAP: 3.33 cm2
MVPKAVEL: 98 m/s
MVPKEVEL: 108 m/s
P 1/2 time: 66 ms
PW: 13.3 mm — AB (ref 0.6–1.1)
TAPSE: 21.2 mm
TDI e' medial: 6.31
WEIGHTICAEL: 3113.6 [oz_av]

## 2017-06-06 LAB — COMPREHENSIVE METABOLIC PANEL
ALT: 524 U/L — AB (ref 14–54)
AST: 576 U/L — AB (ref 15–41)
Albumin: 3 g/dL — ABNORMAL LOW (ref 3.5–5.0)
Alkaline Phosphatase: 133 U/L — ABNORMAL HIGH (ref 38–126)
Anion gap: 10 (ref 5–15)
BUN: 32 mg/dL — AB (ref 6–20)
CHLORIDE: 109 mmol/L (ref 101–111)
CO2: 17 mmol/L — AB (ref 22–32)
CREATININE: 2.94 mg/dL — AB (ref 0.44–1.00)
Calcium: 8 mg/dL — ABNORMAL LOW (ref 8.9–10.3)
GFR calc Af Amer: 18 mL/min — ABNORMAL LOW (ref >60)
GFR, EST NON AFRICAN AMERICAN: 16 mL/min — AB (ref >60)
Glucose, Bld: 61 mg/dL — ABNORMAL LOW (ref 65–99)
Potassium: 3.9 mmol/L (ref 3.5–5.1)
Sodium: 136 mmol/L (ref 135–145)
Total Bilirubin: 1.5 mg/dL — ABNORMAL HIGH (ref 0.3–1.2)
Total Protein: 5.9 g/dL — ABNORMAL LOW (ref 6.5–8.1)

## 2017-06-06 LAB — URINALYSIS, ROUTINE W REFLEX MICROSCOPIC
BILIRUBIN URINE: NEGATIVE
GLUCOSE, UA: NEGATIVE mg/dL
Ketones, ur: NEGATIVE mg/dL
NITRITE: NEGATIVE
PH: 5 (ref 5.0–8.0)
Protein, ur: 100 mg/dL — AB
SPECIFIC GRAVITY, URINE: 1.016 (ref 1.005–1.030)
Squamous Epithelial / LPF: NONE SEEN

## 2017-06-06 LAB — GLUCOSE, CAPILLARY
GLUCOSE-CAPILLARY: 95 mg/dL (ref 65–99)
Glucose-Capillary: 60 mg/dL — ABNORMAL LOW (ref 65–99)
Glucose-Capillary: 89 mg/dL (ref 65–99)
Glucose-Capillary: 82 mg/dL (ref 65–99)

## 2017-06-06 LAB — CREATININE, URINE, RANDOM: Creatinine, Urine: 133.94 mg/dL

## 2017-06-06 LAB — TROPONIN I
Troponin I: 0.04 ng/mL (ref <0.03)
Troponin I: 0.1 ng/mL (ref <0.03)
Troponin I: 0.77 ng/mL (ref <0.03)
Troponin I: 1.02 ng/mL (ref <0.03)

## 2017-06-06 LAB — SODIUM, URINE, RANDOM: Sodium, Ur: 54 mmol/L

## 2017-06-06 LAB — HIV ANTIBODY (ROUTINE TESTING W REFLEX): HIV Screen 4th Generation wRfx: NONREACTIVE

## 2017-06-06 LAB — PROCALCITONIN: Procalcitonin: 2.13 ng/mL

## 2017-06-06 LAB — CK: CK TOTAL: 829 U/L — AB (ref 38–234)

## 2017-06-06 MED ORDER — GABAPENTIN 600 MG PO TABS
1200.0000 mg | ORAL_TABLET | Freq: Three times a day (TID) | ORAL | Status: DC
  Administered 2017-06-06 – 2017-06-07 (×4): 1200 mg via ORAL
  Filled 2017-06-06 (×4): qty 2

## 2017-06-06 MED ORDER — ONDANSETRON HCL 4 MG/2ML IJ SOLN
4.0000 mg | Freq: Three times a day (TID) | INTRAMUSCULAR | Status: DC | PRN
  Administered 2017-06-06: 4 mg via INTRAVENOUS
  Filled 2017-06-06: qty 2

## 2017-06-06 MED ORDER — ONDANSETRON HCL 4 MG PO TABS: 4.0000 mg | ORAL_TABLET | Freq: Three times a day (TID) | ORAL | Status: DC | PRN

## 2017-06-06 MED ORDER — SODIUM CHLORIDE 0.9 % IV SOLN
INTRAVENOUS | Status: AC
  Administered 2017-06-06 (×2): via INTRAVENOUS

## 2017-06-06 MED ORDER — CAMPHOR-MENTHOL 0.5-0.5 % EX LOTN
TOPICAL_LOTION | CUTANEOUS | Status: DC | PRN
  Administered 2017-06-06: 1 via TOPICAL
  Filled 2017-06-06: qty 222

## 2017-06-06 MED ORDER — INSULIN ASPART 100 UNIT/ML ~~LOC~~ SOLN
0.0000 [IU] | Freq: Three times a day (TID) | SUBCUTANEOUS | Status: DC
  Administered 2017-06-08 (×2): 1 [IU] via SUBCUTANEOUS
  Administered 2017-06-09 (×2): 2 [IU] via SUBCUTANEOUS
  Administered 2017-06-09 – 2017-06-10 (×2): 3 [IU] via SUBCUTANEOUS
  Administered 2017-06-10: 1 [IU] via SUBCUTANEOUS

## 2017-06-06 MED ORDER — PANTOPRAZOLE SODIUM 20 MG PO TBEC
20.0000 mg | DELAYED_RELEASE_TABLET | Freq: Every day | ORAL | Status: DC
  Administered 2017-06-06 – 2017-06-10 (×5): 20 mg via ORAL
  Filled 2017-06-06 (×5): qty 1

## 2017-06-06 MED ORDER — VANCOMYCIN HCL IN DEXTROSE 750-5 MG/150ML-% IV SOLN: 750.0000 mg | INTRAVENOUS | Status: DC

## 2017-06-06 MED ORDER — SODIUM CHLORIDE 0.9 % IV BOLUS (SEPSIS): 1000.0000 mL | Freq: Once | INTRAVENOUS | Status: DC

## 2017-06-06 MED ORDER — SODIUM CHLORIDE 0.9 % IV SOLN
INTRAVENOUS | Status: DC
  Administered 2017-06-06: 250 mL/h via INTRAVENOUS
  Administered 2017-06-07: 01:00:00 via INTRAVENOUS

## 2017-06-06 MED ORDER — ACETAMINOPHEN 325 MG PO TABS
650.0000 mg | ORAL_TABLET | Freq: Four times a day (QID) | ORAL | Status: DC | PRN
  Administered 2017-06-06 – 2017-06-09 (×6): 650 mg via ORAL
  Filled 2017-06-06 (×6): qty 2

## 2017-06-06 MED ORDER — SODIUM CHLORIDE 0.9 % IV BOLUS (SEPSIS)
1000.0000 mL | Freq: Once | INTRAVENOUS | Status: AC
  Administered 2017-06-06: 1000 mL via INTRAVENOUS

## 2017-06-06 MED ORDER — ASPIRIN EC 81 MG PO TBEC
81.0000 mg | DELAYED_RELEASE_TABLET | Freq: Every day | ORAL | Status: DC
  Administered 2017-06-06 – 2017-06-10 (×5): 81 mg via ORAL
  Filled 2017-06-06 (×5): qty 1

## 2017-06-06 MED ORDER — INSULIN ASPART 100 UNIT/ML ~~LOC~~ SOLN
0.0000 [IU] | Freq: Every day | SUBCUTANEOUS | Status: DC
Start: 2017-06-06 — End: 2017-06-10

## 2017-06-06 MED ORDER — SODIUM CHLORIDE 0.9 % IV BOLUS (SEPSIS)
500.0000 mL | Freq: Once | INTRAVENOUS | Status: AC
  Administered 2017-06-06: 500 mL via INTRAVENOUS

## 2017-06-06 MED ORDER — SODIUM CHLORIDE 0.9 % IV SOLN: INTRAVENOUS | Status: DC

## 2017-06-06 NOTE — Progress Notes (Signed)
Patient vomited, no PRN to give, paged resident on call for PRN.  Will continue to monitor.  Yumiko Alkins, RN

## 2017-06-06 NOTE — Progress Notes (Signed)
*  PRELIMINARY RESULTS* Echocardiogram 2D Echocardiogram has been performed.  [name] 06/06/2017, 9:41 PM

## 2017-06-06 NOTE — Progress Notes (Addendum)
Pt c/o having needing to void, but she felt that she was unable to do so.  Bladder scan performed, resulting with only 35mL.  Also, creatinine climbing to 2.94.  She has Pure Wick on, but no output. Suggested possible nephro consult to MD.  MD notified of all.

## 2017-06-06 NOTE — Progress Notes (Signed)
Troponin has continued to rise and was up to 1.02. Her troponin elevation has been attributed to demand ischemia with no chest pain and no acute changes to her EKG. The case was discussed with Cardiology on call regarding decision regarding heparin gtt. A stat echo was ordered to assess LV function and for regional wall motion abnormalities. EF 65-70%, no wall motion abnormalities. Discussed findings with Cardiology who felt the pt could benefit from further fluid administration based on echo images. This was consistent with clinical assessment at bedside, with no edema, no significant crackles on lung exam, no difficulty breathing. Ordered IVF at 250 ml/hr x 8 hours and will reassess.   Also notified by nursing of R 2nd toe lesion the family mentioned. On exam, there is a thick, hardened callus to distal tip of digit with some discoloration. Nail is hypertrophic with discoloration consistent with onychomycosis, likely chronic. The pt states the callused lesion has been a chronic issue and she follows with podiatry. On 12/20 the lesion with debrided with no underlying ulceration or drainage. Description appears to be consistent with current exam, no signs of infection. No further interventions necessary at this point.

## 2017-06-06 NOTE — Progress Notes (Signed)
Paged IM resident about patient still being hypotensive.  Bolus ordered.  Since patient is not able to urinate and bladder scan shows Only 215 ml and we need urin sample,I &O cath STAT requested by MD.   Will continue to monitor.  Adryel Wortmann, RN

## 2017-06-06 NOTE — Progress Notes (Signed)
Patient has non-pressure wound to second right toe. Patient family states that wound look worse. Informed resident on call. They will come and assess it.   Will continue to monitor.  Harley Fitzwater, RN

## 2017-06-06 NOTE — Progress Notes (Signed)
Troponin continues to be elevated. Last one 1.02. Patient denies chest pain.  Intern on call [facility] informed.  Will continue to monitor.  Seryna Marek, RN

## 2017-06-06 NOTE — Progress Notes (Signed)
CRITICAL VALUE ALERT  Critical Value:  Troponin 0.77  Date & Time Notied:  06/06/17 @ 2130  Provider Notified: [place]berg  Orders Received/Actions taken:

## 2017-06-06 NOTE — Progress Notes (Signed)
Subjective: Patient continues to be extremely fatigued, have myalgias/arthralgias, and nausea this AM. She states that all of this has been going on for approximately 4 days. She does take care of her 64 year old grandson but otherwise does not have exposure to multiple little kids. She states that she has not travels recently. Belated husband was a chronic hepatitis B carrier and did not use barrier protection. Discussed the plan to continue fluids and antibiotics. All questions and concerns addressed.   Objective: Vital signs in last 24 hours: Vitals:   06/05/17 2339 06/06/17 0145 06/06/17 0357 06/06/17 0614  BP:  (!) 104/49 (!) 90/51 (!) 102/44  Pulse:  92 91 86  Resp:  18 18   Temp:  98.3 F (36.8 C) 98.3 F (36.8 C)   TempSrc:  Oral Oral   SpO2:  93% 95%   Weight: 194 lb 4 oz (88.1 kg)  194 lb 9.6 oz (88.3 kg)   Height: 5\' 6"  (1.676 m)      General: Obese female in no acute distress HENT: Normocephalic, atraumatic, moist mucus membranes, oropharynx clear with no exudates, dilated tortious superficial blood vessels over the zygomatic bone bilaterally  Pulm: good air movement with no wheezing or crackles  CV: RRR, no murmurs, no rubs  Abdomen: Active bowel sounds, hepatomegaly, no tenderness to palpation   Extremities: No LE edema, 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 Goal is to maintain MAP >65 - LFT trending towards normal  - Influenza and Mono negative - No obvious source of infection at this point (UA negative and CXR unremarkable) and may be related to a viral illness. Belated husband had Hep B and they did not  use barrier protection. Patient was not immunized against Hep B.  - Hepatitis panel pending  - Continuing Zosyn   Transaminitis  - LFTs trending back towards normal  - RUQ ultrasound illustrating no findings of cirrhosis with is enlarged and showing diffuse fatty infiltration within the liver - Denies the use of EtOH or IV drugs  - Risk factors for Hep B, checking a hepatitis panel  - At this point with the available clinical information the most likely etiology is an intrahepatic process like nonalcoholic fatty liver disease with acute decompensation due to a dehydration/viral syndrome; however, the differential still includes hepatitis A/B/C, autoimmune hepatitis, hemochromatosis, or tick born illnesses. It is unlikely to be PBC, wilson's disease, Alpha-1 antitrypsin deficiency, hemolysis, or muscular disorders.   AKI  - Creatinine on admission found to be 2.86 and now is 2.94. Baseline around ~1.0  - Urine analysis illustrating proteinuria and hemoglobin without increase in RBCs. CK is elevated to explain this finding  - Has received several liters of fluid and is now oliguric borderline anuric. Fluid bolus challenge with NS then bladder scan. If anuric will consult nephrology.  - Microscopy illustrating granular cast and Fena <1% (not on loop diuretics) - It is likely that she has had prolonged hypotension now leading to ATN; therefore, we will continue fluids with goal to maintain MAP >65, continue to monitor urine output, and check creatinine daily  - No acute indication for HD   Troponinemia  -  Troponin trend: 0.04 -> 0.10  - EKG unchanged compared to prior with ST depression in the inferior leads and T wave inversion in V5-V6 - Likely Type II MI and continues to be elevated due to AKI. Will continue to monitor. At this point do not feel she needs to be started on Heparin or need a cardiology consult.   Normocytic Anemia  - Hgb 11.0 this AM, stable compared to prior  - RDW within  normal limits   Thrombocytopenia  - Patient has thrombocytopenia since April 2018, this may be a result of her liver disease   DM - Continue sliding scale insulin   HTN - Currently hypotensive, holding BP medicines  - IVF to maintain MAP > 65  Dispo: Anticipated discharge in approximately >1 day(s).   Ina Homes, MD 06/06/2017, 7:51 AM My Pager: [phone]

## 2017-06-06 NOTE — Progress Notes (Signed)
Troponin 0.04. Patient denies pain. 0/10. Resident on call [facility] notified.   Will continue to monitor.  Camya Haydon, RN     [employer]

## 2017-06-06 NOTE — Plan of Care (Signed)
  Activity: Risk for activity intolerance will decrease 06/06/2017 0814 - Not Progressing by Imagene Gurney, RN

## 2017-06-06 NOTE — Progress Notes (Signed)
Pharmacy Antibiotic Note  [name] is a 66 y.o. female admitted on 06/05/2017 with sepsis.  Pharmacy has been consulted for vancomycin.  Pt with ARf, SCr 2.94 this am  Plan: Change Vancomycin 750 mg IV q48h F/U renal function  Height: 5\' 6"  (167.6 cm) Weight: 194 lb 9.6 oz (88.3 kg) IBW/kg (Calculated) : 59.3  Temp (24hrs), Avg:99.2 F (37.3 C), Min:98.3 F (36.8 C), Max:101.4 F (38.6 C)  Recent Labs  Lab 06/05/17 1812 06/05/17 1826 06/05/17 2201 06/06/17 0100  WBC 4.4  --   --  5.6  CREATININE 2.86*  --   --  2.94*  LATICACIDVEN  --  1.08 0.40*  --     Estimated Creatinine Clearance: 21.6 mL/min (A) (by C-G formula based on SCr of 2.94 mg/dL (H)).    Allergies  Allergen Reactions  . Anesthesia S-I-60 Other (See Comments)    Hallucinations.  Yvette Rack [Cyclobenzaprine] Other (See Comments)    Prolonged QTc to 571, tachycardia  . Soma [Carisoprodol] Itching and Rash   Phillis Knack, PharmD, BCPS  06/06/2017 6:39 AM

## 2017-06-06 NOTE — Progress Notes (Signed)
CRITICAL VALUE ALERT  Critical Value:  Troponin 0.10  Date & Time Notied:  06/06/17 @ 0900  Provider Notified: [place]  Orders Received/Actions taken:

## 2017-06-07 DIAGNOSIS — E669 Obesity, unspecified: Secondary | ICD-10-CM

## 2017-06-07 DIAGNOSIS — L97409 Non-pressure chronic ulcer of unspecified heel and midfoot with unspecified severity: Secondary | ICD-10-CM

## 2017-06-07 DIAGNOSIS — I21A1 Myocardial infarction type 2: Secondary | ICD-10-CM

## 2017-06-07 DIAGNOSIS — Z794 Long term (current) use of insulin: Secondary | ICD-10-CM

## 2017-06-07 DIAGNOSIS — I959 Hypotension, unspecified: Secondary | ICD-10-CM

## 2017-06-07 DIAGNOSIS — E119 Type 2 diabetes mellitus without complications: Secondary | ICD-10-CM

## 2017-06-07 DIAGNOSIS — D649 Anemia, unspecified: Secondary | ICD-10-CM

## 2017-06-07 DIAGNOSIS — D696 Thrombocytopenia, unspecified: Secondary | ICD-10-CM

## 2017-06-07 DIAGNOSIS — K7200 Acute and subacute hepatic failure without coma: Secondary | ICD-10-CM

## 2017-06-07 DIAGNOSIS — I10 Essential (primary) hypertension: Secondary | ICD-10-CM

## 2017-06-07 LAB — TROPONIN I: Troponin I: 0.7 ng/mL (ref <0.03)

## 2017-06-07 LAB — GLUCOSE, CAPILLARY
GLUCOSE-CAPILLARY: 101 mg/dL — AB (ref 65–99)
GLUCOSE-CAPILLARY: 168 mg/dL — AB (ref 65–99)
Glucose-Capillary: 100 mg/dL — ABNORMAL HIGH (ref 65–99)
Glucose-Capillary: 154 mg/dL — ABNORMAL HIGH (ref 65–99)

## 2017-06-07 LAB — URINE CULTURE: Culture: NO GROWTH

## 2017-06-07 LAB — CBC
HEMATOCRIT: 32.4 % — AB (ref 36.0–46.0)
HEMOGLOBIN: 10.1 g/dL — AB (ref 12.0–15.0)
MCH: 30.1 pg (ref 26.0–34.0)
MCHC: 31.2 g/dL (ref 30.0–36.0)
MCV: 96.7 fL (ref 78.0–100.0)
Platelets: 96 10*3/uL — ABNORMAL LOW (ref 150–400)
RBC: 3.35 MIL/uL — ABNORMAL LOW (ref 3.87–5.11)
RDW: 14.9 % (ref 11.5–15.5)
WBC: 5.6 10*3/uL (ref 4.0–10.5)

## 2017-06-07 LAB — COMPREHENSIVE METABOLIC PANEL
ALBUMIN: 2.5 g/dL — AB (ref 3.5–5.0)
ALT: 239 U/L — ABNORMAL HIGH (ref 14–54)
ANION GAP: 10 (ref 5–15)
AST: 118 U/L — AB (ref 15–41)
Alkaline Phosphatase: 91 U/L (ref 38–126)
BUN: 43 mg/dL — AB (ref 6–20)
CHLORIDE: 112 mmol/L — AB (ref 101–111)
CO2: 14 mmol/L — ABNORMAL LOW (ref 22–32)
Calcium: 7.9 mg/dL — ABNORMAL LOW (ref 8.9–10.3)
Creatinine, Ser: 3.86 mg/dL — ABNORMAL HIGH (ref 0.44–1.00)
GFR calc Af Amer: 13 mL/min — ABNORMAL LOW (ref >60)
GFR calc non Af Amer: 11 mL/min — ABNORMAL LOW (ref >60)
GLUCOSE: 104 mg/dL — AB (ref 65–99)
POTASSIUM: 3.9 mmol/L (ref 3.5–5.1)
Sodium: 136 mmol/L (ref 135–145)
Total Bilirubin: 0.8 mg/dL (ref 0.3–1.2)
Total Protein: 5.2 g/dL — ABNORMAL LOW (ref 6.5–8.1)

## 2017-06-07 LAB — EPSTEIN-BARR VIRUS VCA, IGG: EBV VCA IgG: >600 U/mL — ABNORMAL HIGH (ref 0.0–17.9)

## 2017-06-07 LAB — CK TOTAL AND CKMB (NOT AT ARMC)
CK, MB: 12.4 ng/mL — ABNORMAL HIGH (ref 0.5–5.0)
RELATIVE INDEX: 2.5 (ref 0.0–2.5)
Total CK: 498 U/L — ABNORMAL HIGH (ref 38–234)

## 2017-06-07 LAB — CMV IGM: CMV IgM: <30 AU/mL (ref 0.0–29.9)

## 2017-06-07 LAB — EPSTEIN-BARR VIRUS VCA, IGM: EBV VCA IgM: <36 U/mL (ref 0.0–35.9)

## 2017-06-07 LAB — HEPATITIS PANEL, ACUTE
HEP A IGM: NEGATIVE
HEP B C IGM: NEGATIVE
HEP B S AG: NEGATIVE

## 2017-06-07 LAB — PHOSPHORUS: Phosphorus: 5.4 mg/dL — ABNORMAL HIGH (ref 2.5–4.6)

## 2017-06-07 MED ORDER — LACTATED RINGERS IV SOLN
INTRAVENOUS | Status: DC
  Administered 2017-06-07 – 2017-06-08 (×3): via INTRAVENOUS

## 2017-06-07 MED ORDER — DEXTROSE 5 % IV SOLN
1.0000 g | Freq: Every day | INTRAVENOUS | Status: DC
  Administered 2017-06-07 – 2017-06-08 (×2): 1 g via INTRAVENOUS
  Filled 2017-06-07 (×2): qty 1

## 2017-06-07 MED ORDER — ALBUTEROL SULFATE (2.5 MG/3ML) 0.083% IN NEBU: 3.0000 mL | INHALATION_SOLUTION | Freq: Four times a day (QID) | RESPIRATORY_TRACT | Status: DC | PRN

## 2017-06-07 NOTE — Consult Note (Signed)
   [facility] CM Inpatient Consult   06/07/2017  [name] July 06, 1950 [id number]  Patient screened for potential [place] [facility] services. Patient is in the [place] of the [place] [facility] services under patient's Medicare plan. Patient has a high risk score.  Met with the patient at the bedside.  Patient currently states she has a headache and has her arm across her face.  She states the nurse is aware. Explained briefly [place] services available and was asked to leave the brochure and 24 hour magnet nurse advise line with contact information at the bedside.  She endorses Internal Medicine as her primary care providers.   For questions contact:   Natividad Brood, RN BSN [facility] Liaison  [phone] business mobile phone Toll free office [phone]

## 2017-06-07 NOTE — Discharge Summary (Signed)
Name: [name] MRN: [id number] DOB: 10/27/1950 66 y.o. PCP: Julia Mage, MD  Date of Admission: 06/05/2017  5:38 PM Date of Discharge: 06/10/2017 Attending Physician: Dr. Beryle Beams   Discharge Diagnosis: 1. Sepsis 2. Acute Kidney Injury.  3. Type 2 NSTEMI.  4. Transaminitis.  Active Problems:   Diabetes mellitus type 2, controlled, with complications (HCC)   Elevated LFTs   AKI (acute kidney injury) [facility])  Discharge Medications: Allergies as of 06/10/2017      Reactions   Anesthesia S-i-60 Other (See Comments)   Hallucinations.   Flexeril [cyclobenzaprine] Other (See Comments)   Prolonged QTc to 571, tachycardia   Soma [carisoprodol] Itching, Rash      Medication List    STOP taking these medications   lisinopril 40 MG tablet Commonly known as:  PRINIVIL,ZESTRIL   metFORMIN 750 MG 24 hr tablet Commonly known as:  GLUCOPHAGE-XR   sertraline 50 MG tablet Commonly known as:  ZOLOFT   sitaGLIPtin 100 MG tablet Commonly known as:  JANUVIA     TAKE these medications   albuterol 108 (90 Base) MCG/ACT inhaler Commonly known as:  PROVENTIL HFA;VENTOLIN HFA Inhale 1-2 puffs into the lungs every 6 (six) hours as needed for wheezing or shortness of breath.   amLODipine 10 MG tablet Commonly known as:  NORVASC Take 1 tablet (10 mg total) by mouth daily.   aspirin 81 MG tablet Take 81 mg by mouth every morning.   aspirin-acetaminophen-caffeine 250-250-65 MG tablet Commonly known as:  EXCEDRIN MIGRAINE Take 2 tablets by mouth every 8 (eight) hours as needed for headache.   fluticasone 50 MCG/ACT nasal spray Commonly known as:  FLONASE USE 2 SPRAYS IN EACH NOSTRIL DAILY   gabapentin 600 MG tablet Commonly known as:  NEURONTIN Take 2 tablets (1,200 mg total) by mouth 3 (three) times daily.   glipiZIDE 5 MG 24 hr tablet Commonly known as:  GLUCOTROL XL Take 1 tablet (5 mg total) by mouth daily with breakfast. What changed:  when to take this     glucose blood test strip Commonly known as:  ONETOUCH VERIO Check blood sugar 3x/day   meloxicam 7.5 MG tablet Commonly known as:  MOBIC Take 1 tablet (7.5 mg total) by mouth daily.   ondansetron 4 MG tablet Commonly known as:  ZOFRAN Take 1 tablet (4 mg total) by mouth every 6 (six) hours.   pantoprazole 20 MG tablet Commonly known as:  PROTONIX Take 1 tablet (20 mg total) by mouth daily as needed.   rosuvastatin 40 MG tablet Commonly known as:  CRESTOR Take 1 tablet (40 mg total) by mouth daily.   SUMAtriptan 25 MG tablet Commonly known as:  IMITREX Take 1 tablet (25mg ) only if you have a headache. May repeat in 2 hours if headache persists or recurs. Do not take if you do not have a headache.   traMADol 50 MG tablet Commonly known as:  ULTRAM Take 1 tablet (50 mg total) by mouth every 12 (twelve) hours as needed for severe pain.     ASK your doctor about these medications   ibuprofen 200 MG tablet Commonly known as:  ADVIL Take 4 tablets (800 mg total) by mouth 2 (two) times daily as needed. Ask about: Should I take this medication?   penicillin v potassium 500 MG tablet Commonly known as:  VEETID Take 1 tablet (500 mg total) by mouth 4 (four) times daily for 7 days. Ask about: Should I take this medication?  Disposition and follow-up:   JuliaJulia Flowers was discharged from [facility] in Good condition.  At the hospital follow up visit please address:  1.  Please assess BP as patient initially presented hypotensive. Assess for any ongoing signs/symptoms if infection. Please obtain BMP to assess renal function. If normal retstart metformin, januvia, and lisinopril.   2.  Labs / imaging needed at time of follow-up: BMP  3.  Pending labs/ test needing follow-up: None  Follow-up Appointments: Follow-up Information    [facility] Follow up.   Why:  You have an appointment scheduled on J anuary 4th at 10:15 am   Contact information: [address] [facility] Course by problem list: Active Problems:   Diabetes mellitus type 2, controlled, with complications (HCC)   Elevated LFTs   AKI (acute kidney injury) ([place])   1. Sepsis. [name] is a 66 y.o female with DM and HTN who presented to the ED with 4 days of generalize malaise, myalgias, headaches, and N/V. She was found to be hypotensive and have a possible source of infection in the oral cavity (necrotic tooth). She was treated empirically with Vancomycin and Zosyn. She had end-organ damage including elevated LFTs, AKI, and troponinemia. Aggressive fluids were initiated and alternative source of infection was looked for. Unfortunately no other source of infection has been identified (negative UA, CXR, blood cultures, urine cultures, influenza, EBV, CMV, podiatry consult for pre-ulcer on right 2nd digit, and hepatitis panel). She was discharged home in stable condition. No antibiotic therapy prescribed as no source of infection identified.   2. Acute Kidney Injury. [name] presented with an elevated creatinine of 2.86 from a baseline of ~1.0. Work-up indicated that the patient likely experienced prolonged hypotension subsequently leading to ATN. She was treated with aggressive IVF and we maintained her MAP >65. Her urinary output improved and her renal function began to trend back to normal. We do recommend repeat BMP in approximately 1-2 weeks to ensure renal function has returned to normal.   3. Type 2 NSTEMI. [name] was found to have an elevated troponin that peaked at 1.02 without associated changes in her EKG. Echocardiogram illustrated a normal EF without wall motion abnormalities. Her troponin began to trend down and no further intervention was pursued as this was likely due to demand ischemia with decreased clearance due to her AKI.   4. Transaminitis. [name] was found to  have elevated LFT without elevated bilirubin or abnormal coagulation panel. With aggressive IVF her LFTs trend back to normal range. Her acute liver injury was likely a result of shock liver from hypoperfusion. While she did have risk factors for Hepatitis B, her hepatitis panel came back negative. LFTs normal on day fo discharge.   5. Headache: Patient complained of 3-day headache associated with changes in vision and photophobia on day of discharge that was not relieved by Tylenol or migraine cocktail. Head CT ordered as no source of infection had been identified, but negative. She does have a past history of migraines associated with menses that resolved after a hysterectomy.  Lumbar puncture was not performed as she had no meningeal signs on exam.  Headache resolved with sumatriptan and she was discharged on sumatriptan 25 mg to use as needed for headache.  Discharge Vitals:   BP (!) 162/76   Pulse 60   Temp 98 F (36.7 C) (Oral)  Resp 18   Ht 5\' 6"  (1.676 m)   Wt 198 lb 8 oz (90 kg)   SpO2 100%   BMI 32.04 kg/m   Pertinent Labs, Studies, and Procedures:   Head CT 12/28: IMPRESSION: No acute intracranial abnormality. Stable noncontrast CT appearance of chronic cerebral small vessel disease since 2017.  Discharge Instructions: Discharge Instructions    Call MD for:  difficulty breathing, headache or visual disturbances   Complete by:  As directed    Call MD for:  extreme fatigue   Complete by:  As directed    Call MD for:  persistant nausea and vomiting   Complete by:  As directed    Call MD for:  temperature >100.4   Complete by:  As directed    Diet Carb Modified   Complete by:  As directed    Discharge instructions   Complete by:  As directed    Please stop taking metformin, januvia, and lisinopril as these can cause further damage to your kidneys. You will need a follow up appointment with [place] (Internal Medicine clinic) within 1 week to restart these medications. I  scheduled you an appointment on January 4th at 10:15am. If this day or time does not work for you please call the clinic and reschedule. Make sure to reschedule within 1 week from today. I sent a prescription for amlodipine 10 mg to your pharmacy. Please stop taking amlodipine 2.5 mg and start taking amlodipine 10 mg. This will only be temporary until we can restart your lisinopril. I also sent a prescription for sumatriptan or Imitrex for your headache. Take 1 tablet every day if needed. You may take a second tablet 2 hours later if headache persists. If you do not have a headache, you dont need to take this medication. Only take when you have a headache. Please return to the ED if you get a fever or if you start to experience similar symptoms as prior to coming to the ED.   Increase activity slowly   Complete by:  As directed       Signed: Welford Roche, MD  My Pager: [phone]

## 2017-06-07 NOTE — Progress Notes (Addendum)
   Subjective: Patient continues to have myalgia, headaches, and congestion. She states that they feel worse than days prior. Nausea has improved and she was able to tolerate PO intake yesterday. She continues to have minimal urine output per her reports. She would like to start ambulating to the restroom. Discussed that her LFTs are improving but her kidney function continues to be decreased. We will continue fluids and monitor her urine output. She voice understanding and does not have any questions/concerns.   Objective: Vital signs in last 24 hours: Vitals:   06/06/17 1945 06/06/17 2150 06/07/17 0043 06/07/17 0504  BP: (!) 86/47 (!) 105/55 (!) 108/56 (!) 103/50  Pulse: 78  85 82  Resp: _0 Temp: 98.2 F (36.8 C)  98.7 F (37.1 C) 98.4 F (36.9 C)  TempSrc: Oral  Oral Oral  SpO2: 95%  97% 95%  Weight:      Height:       General: Obese female in no acute distress Pulm: Good air movement with no wheezing or crackles  CV: RRR, no murmurs, no rubs  Abdomen: Active bowel sounds, soft, non-distended, no tenderness to palpation  Extremities: No LE edema Neuro: Alert and oriented x 3  Assessment/Plan:  Hypotension - Met SIRs criteria but no clear source of infection at this point. Hypotension is improving with goal to maintain MAP >65  - Patient continues to have fatigue, myalgias/arthralgias, headaches - Blood and urine cultures showing no growth to date - LFT trending towards normal  - Hepatitis panel pending  - Continuing Vanc and Cefepime   Transaminitis 2/2 prolonged hypotension leading to shock liver - LFTs trending back towards normal  - Coagulation panel and bilirubin within normal limits  - Hepatitis panel pending  AKI 2/2 prolonged hypotension leading to ATN - Baseline creatinine ~1.0  - Creatinine trend: 2.86 -> 2.94 - > 3.86 - Patient is acidotic with increasing creatinine. The patient acidosis is likely multifactorial from the significant amount of NS she  has received and her AKI. We will switch her fluids to LR and consider giving bicarb. - Hypotension is improving; however patient is still oliguric. Continue strict I&Os and maintain MAP >65  Troponinemia 2/2 Type II NSTEMI - Troponin trend: 0.04 -> 0.10 -> 0.77 -> 1.02 -> 0.70 - EKG unchanged compared to prior with ST depression in the inferior leads and T wave inversion in V5-V6 - Case discussed with cardiology. Echocardiogram obtained overnight that illustrated EF 65-70% and G2DD with no wall motion abnormalities and decreased volume status   Normocytic Anemia  - Hgb 10.1 this AM, stable compared to prior  - RDW within normal limits   Thrombocytopenia  - Patient has thrombocytopenia since April 2018, this may be a result of her liver disease   DM - Continue sliding scale insulin   HTN - Currently hypotensive, holding BP medicines  - IVF to maintain MAP > 65  Dispo: Anticipated discharge in approximately >1 day(s).   Ina Homes, MD 06/07/2017, 5:46 AM My Pager: [phone]

## 2017-06-07 NOTE — Consult Note (Signed)
Reason for Consult:Callus right foot Referring Physician: Dr. Heber Altamont, DO  [name] is an 66 y.o. female.  HPI: Shannia was admitted to the hospital on December 24 who presented to the hospital with concerns of generalized weakness and lightheadedness.The she was thought to be septic. She as found to have a transaminitis, AKI, and troponinemia. Podiatry is consult for concerns of a callus on the right 2nd toe as the patient's daughter was concerned that look different than has previously.  I have recently seen the patient in the office last week with the area was debrided and there is no underlying ulceration or any signs of infection noted.  The patient states that she is not concerned about the toe and has not changed.  She denies any redness or drainage coming from the area.  She has no other lower extremity concerns and overall she is feeling better. Past Medical History:  Diagnosis Date  . Allergy   . Anemia   . Arthritis    "left knee" (09/08/2015)  . Asthma   . Brachial plexus disorders   . CHF (congestive heart failure) ([place])   . Chronic pain    neck pain, headache, neuropathy  . COPD (chronic obstructive pulmonary disease) ([place])    SEES ONLY DR. PATEL   . Depression    "when my husband passed in 2013"  . GERD (gastroesophageal reflux disease)   . Hypertension   . Hypertriglyceridemia   . Migraine    "monthly" (09/08/2015)  . Neuromuscular disorder (HCC)    neuropathy  . Puncture wound of foot, right 05/17/2012   Tetanus shot 3 yrs ago at [facility] in [place], per pt report   . Stroke ([place])    TIAS    IN [place]   4 YRS AGO   . Type II diabetes mellitus ([place]) 2010   diagnosed around 2010, only ever on metformin    Past Surgical History:  Procedure Laterality Date  . 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 ARTHRODESIS METATARSAL     RIGHT 5  TH   . BILATERAL SALPINGOOPHORECTOMY Bilateral 1986   "after hemtoma evacuations; had to cut me open"  . CARPAL TUNNEL RELEASE Bilateral   . COLONOSCOPY     10 + yrs ago- pt unsure- was in [place]- pt states  MD will not send records but colon was normal per pt.   Marland Kitchen DILATION AND CURETTAGE OF UTERUS  ~ 1982   S/P miscarriage  . HEMATOMA EVACUATION Right 1986 x 2   "OVARY; w/in 1 wk after hysterectomy"  . KNEE ARTHROSCOPY     LEFT  . LAPAROSCOPIC CHOLECYSTECTOMY    . SHOULDER ARTHROSCOPY W/ ROTATOR CUFF REPAIR Bilateral   . TONSILLECTOMY    . TOTAL KNEE ARTHROPLASTY Left 09/30/2016   Procedure: LEFT TOTAL KNEE ARTHROPLASTY;  Surgeon: Netta Cedars, MD;  Location: [facility];  Service: Orthopedics;  Laterality: Left;  . TUBAL LIGATION    . VAGINAL HYSTERECTOMY  1986    Family History  Problem Relation Age of Onset  . Hyperlipidemia Mother   . Heart attack Father 37  . Hypertension Father   . Cancer Paternal Grandfather        Lung cancer  . Cancer Maternal Grandmother   . Heart attack Maternal Grandmother   . Colon cancer Neg Hx   . Colon polyps Neg Hx   .  Esophageal cancer Neg Hx   . Rectal cancer Neg Hx   . Stomach cancer Neg Hx     Social History:  reports that she quit smoking about 9 months ago. Her smoking use included cigarettes. She has a 4.70 pack-year smoking history. she has never used smokeless tobacco. She reports that she does not drink alcohol or use drugs.  Allergies:  Allergies  Allergen Reactions  . Anesthesia S-I-60 Other (See Comments)    Hallucinations.  Yvette Rack [Cyclobenzaprine] Other (See Comments)    Prolonged QTc to 571, tachycardia  . Soma [Carisoprodol] Itching and Rash    Medications: I have reviewed the patient's current medications.  Results for orders placed or performed during the hospital encounter of 06/05/17 (from the past 48 hour(s))  Blood Culture (routine x 2)     Status: None (Preliminary result)   Collection Time: 06/05/17  8:19  PM  Result Value Ref Range   Specimen Description BLOOD LEFT ANTECUBITAL    Special Requests      BOTTLES DRAWN AEROBIC AND ANAEROBIC Blood Culture adequate volume   Culture NO GROWTH 2 DAYS    Report Status PENDING   Influenza panel by PCR (type A & B)     Status: None   Collection Time: 06/05/17  8:19 PM  Result Value Ref Range   Influenza A By PCR NEGATIVE NEGATIVE   Influenza B By PCR NEGATIVE NEGATIVE    Comment: (NOTE) The Xpert Xpress Flu assay is intended as an aid in the diagnosis of  influenza and should not be used as a sole basis for treatment.  This  assay is FDA approved for nasopharyngeal swab specimens only. Nasal  washings and aspirates are unacceptable for Xpert Xpress Flu testing.   Acetaminophen level     Status: Abnormal   Collection Time: 06/05/17  8:19 PM  Result Value Ref Range   Acetaminophen (Tylenol), Serum <10 (L) 10 - 30 ug/mL    Comment:        THERAPEUTIC CONCENTRATIONS VARY SIGNIFICANTLY. A RANGE OF 10-30 ug/mL MAY BE AN EFFECTIVE CONCENTRATION FOR MANY PATIENTS. HOWEVER, SOME ARE BEST TREATED AT CONCENTRATIONS OUTSIDE THIS RANGE. ACETAMINOPHEN CONCENTRATIONS >150 ug/mL AT 4 HOURS AFTER INGESTION AND >50 ug/mL AT 12 HOURS AFTER INGESTION ARE OFTEN ASSOCIATED WITH TOXIC REACTIONS.   Mononucleosis screen     Status: None   Collection Time: 06/05/17  8:19 PM  Result Value Ref Range   Mono Screen NEGATIVE NEGATIVE  Epstein-Barr virus VCA, IgG     Status: Abnormal   Collection Time: 06/05/17  8:19 PM  Result Value Ref Range   EBV VCA IgG >600.0 (H) 0.0 - 17.9 U/mL    Comment: (NOTE)                                 Negative        <18.0                                 Equivocal 18.0 - 21.9                                 Positive        >21.9 Performed At: [facility] [place], [place] [id number] Rush Farmer MD IR:[id number]   Epstein-Barr  virus VCA, IgM     Status: None   Collection Time: 06/05/17  8:19 PM   Result Value Ref Range   EBV VCA IgM <36.0 0.0 - 35.9 U/mL    Comment: (NOTE)                                 Negative        <36.0                                 Equivocal 36.0 - 43.9                                 Positive        >43.9 Performed At: [facility] [place], [place] [id number] Rush Farmer MD [id number]   CMV IgM     Status: None   Collection Time: 06/05/17  8:19 PM  Result Value Ref Range   CMV IgM <30.0 0.0 - 29.9 AU/mL    Comment: (NOTE)                                Negative         <30.0                                Equivocal  30.0 - 34.9                                Positive         >34.9 A positive result is generally indicative of acute infection, reactivation or persistent IgM production. Performed At: [facility] [place], [place] [id number] Rush Farmer MD JI:[id number]   Protime-INR     Status: None   Collection Time: 06/05/17  8:28 PM  Result Value Ref Range   Prothrombin Time 13.7 11.4 - 15.2 seconds   INR 1.06   Hepatitis panel, acute     Status: None   Collection Time: 06/05/17  8:28 PM  Result Value Ref Range   Hepatitis B Surface Ag Negative Negative   HCV Ab <0.1 0.0 - 0.9 s/co ratio    Comment: (NOTE)                                  Negative:     < 0.8                             Indeterminate: 0.8 - 0.9                                  Positive:     > 0.9 The CDC recommends that a positive HCV antibody result be followed up with a HCV Nucleic Acid Amplification test (751025). Performed At: [facility] [place], [place] [id number] Rush Farmer MD [id number]    Hep A IgM Negative Negative   Hep B C IgM Negative Negative  I-Stat CG4 Lactic Acid, ED  (not at  [facility])     Status: Abnormal   Collection Time: 06/05/17 10:01 PM  Result Value Ref Range   Lactic Acid, Venous 0.40 (L) 0.5 - 1.9 mmol/L  Comprehensive metabolic panel     Status: Abnormal    Collection Time: 06/06/17  1:00 AM  Result Value Ref Range   Sodium 136 135 - 145 mmol/L   Potassium 3.9 3.5 - 5.1 mmol/L   Chloride 109 101 - 111 mmol/L   CO2 17 (L) 22 - 32 mmol/L   Glucose, Bld 61 (L) 65 - 99 mg/dL   BUN 32 (H) 6 - 20 mg/dL   Creatinine, Ser 2.94 (H) 0.44 - 1.00 mg/dL   Calcium 8.0 (L) 8.9 - 10.3 mg/dL   Total Protein 5.9 (L) 6.5 - 8.1 g/dL   Albumin 3.0 (L) 3.5 - 5.0 g/dL   AST 576 (H) 15 - 41 U/L   ALT 524 (H) 14 - 54 U/L   Alkaline Phosphatase 133 (H) 38 - 126 U/L   Total Bilirubin 1.5 (H) 0.3 - 1.2 mg/dL   GFR calc non Af Amer 16 (L) >60 mL/min   GFR calc Af Amer 18 (L) >60 mL/min    Comment: (NOTE) The eGFR has been calculated using the CKD EPI equation. This calculation has not been validated in all clinical situations. eGFR's persistently <60 mL/min signify possible Chronic Kidney Disease.    Anion gap 10 5 - 15  CBC     Status: Abnormal   Collection Time: 06/06/17  1:00 AM  Result Value Ref Range   WBC 5.6 4.0 - 10.5 K/uL   RBC 3.62 (L) 3.87 - 5.11 MIL/uL   Hemoglobin 11.0 (L) 12.0 - 15.0 g/dL   HCT 35.2 (L) 36.0 - 46.0 %   MCV 97.2 78.0 - 100.0 fL   MCH 30.4 26.0 - 34.0 pg   MCHC 31.3 30.0 - 36.0 g/dL   RDW 14.9 11.5 - 15.5 %   Platelets 103 (L) 150 - 400 K/uL    Comment: REPEATED TO VERIFY SPECIMEN CHECKED FOR CLOTS PLATELET COUNT CONFIRMED BY SMEAR   HIV antibody     Status: None   Collection Time: 06/06/17  1:00 AM  Result Value Ref Range   HIV Screen 4th Generation wRfx Non Reactive Non Reactive    Comment: (NOTE) Performed At: [facility] [address] [place], [place] [id number] Rush Farmer MD UX:[id number]   Troponin I (q 6hr x 3)     Status: Abnormal   Collection Time: 06/06/17  1:00 AM  Result Value Ref Range   Troponin I 0.04 (HH) <0.03 ng/mL    Comment: CRITICAL RESULT CALLED TO, READ BACK BY AND VERIFIED WITH:  TO GCVZETIZ(RN) BY TCLEVELAND 06/06/2017   Procalcitonin - Baseline     Status: None    Collection Time: 06/06/17  1:00 AM  Result Value Ref Range   Procalcitonin 2.13 ng/mL    Comment:        Interpretation: PCT > 2 ng/mL: Systemic infection (sepsis) is likely, unless other causes are known. (NOTE)       Sepsis PCT Algorithm           Lower Respiratory Tract                                      Infection PCT Algorithm    ----------------------------     ----------------------------  PCT < 0.25 ng/mL                PCT < 0.10 ng/mL         Strongly encourage             Strongly discourage   discontinuation of antibiotics    initiation of antibiotics    ----------------------------     -----------------------------       PCT 0.25 - 0.50 ng/mL            PCT 0.10 - 0.25 ng/mL               OR       >80% decrease in PCT            Discourage initiation of                                            antibiotics      Encourage discontinuation           of antibiotics    ----------------------------     -----------------------------         PCT >= 0.50 ng/mL              PCT 0.26 - 0.50 ng/mL               AND       <80% decrease in PCT              Encourage initiation of                                             antibiotics       Encourage continuation           of antibiotics    ----------------------------     -----------------------------        PCT >= 0.50 ng/mL                  PCT > 0.50 ng/mL               AND         increase in PCT                  Strongly encourage                                      initiation of antibiotics    Strongly encourage escalation           of antibiotics                                     -----------------------------                                           PCT <= 0.25 ng/mL  OR                                        > 80% decrease in PCT                                     Discontinue / Do not initiate                                             antibiotics    Urinalysis, Routine w reflex microscopic     Status: Abnormal   Collection Time: 06/06/17  4:25 AM  Result Value Ref Range   Color, Urine AMBER (A) YELLOW    Comment: BIOCHEMICALS MAY BE AFFECTED BY COLOR   APPearance HAZY (A) CLEAR   Specific Gravity, Urine 1.016 1.005 - 1.030   pH 5.0 5.0 - 8.0   Glucose, UA NEGATIVE NEGATIVE mg/dL   Hgb urine dipstick MODERATE (A) NEGATIVE   Bilirubin Urine NEGATIVE NEGATIVE   Ketones, ur NEGATIVE NEGATIVE mg/dL   Protein, ur 100 (A) NEGATIVE mg/dL   Nitrite NEGATIVE NEGATIVE   Leukocytes, UA TRACE (A) NEGATIVE   RBC / HPF 0-5 0 - 5 RBC/hpf   WBC, UA 6-30 0 - 5 WBC/hpf   Bacteria, UA RARE (A) NONE SEEN   Squamous Epithelial / LPF NONE SEEN NONE SEEN  Urine culture     Status: None   Collection Time: 06/06/17  5:09 AM  Result Value Ref Range   Specimen Description URINE, CATHETERIZED    Special Requests NONE    Culture NO GROWTH    Report Status 06/07/2017 FINAL   Troponin I     Status: Abnormal   Collection Time: 06/06/17  6:32 AM  Result Value Ref Range   Troponin I 0.10 (HH) <0.03 ng/mL    Comment: CRITICAL RESULT CALLED TO, READ BACK BY AND VERIFIED WITH: S.LEWIS RN @ 0751 06/06/17 BY C.EDENS   CK     Status: Abnormal   Collection Time: 06/06/17  6:32 AM  Result Value Ref Range   Total CK 829 (H) 38 - 234 U/L  Glucose, capillary     Status: Abnormal   Collection Time: 06/06/17  7:53 AM  Result Value Ref Range   Glucose-Capillary 60 (L) 65 - 99 mg/dL   Comment 1 Notify RN   Glucose, capillary     Status: None   Collection Time: 06/06/17 12:01 PM  Result Value Ref Range   Glucose-Capillary 89 65 - 99 mg/dL   Comment 1 Notify RN   Troponin I     Status: Abnormal   Collection Time: 06/06/17 12:22 PM  Result Value Ref Range   Troponin I 0.77 (HH) <0.03 ng/mL    Comment: CRITICAL RESULT CALLED TO, READ BACK BY AND VERIFIED WITH: S.LEWIS, RN 12.25.18 1348 SPIKESN   Glucose, capillary     Status: None   Collection Time: 06/06/17   4:51 PM  Result Value Ref Range   Glucose-Capillary 95 65 - 99 mg/dL   Comment 1 Notify RN   Troponin I     Status: Abnormal   Collection Time: 06/06/17  6:34 PM  Result Value Ref Range   Troponin I  1.02 (HH) <0.03 ng/mL    Comment: CRITICAL VALUE NOTED.  VALUE IS CONSISTENT WITH PREVIOUSLY REPORTED AND CALLED VALUE.  Glucose, capillary     Status: None   Collection Time: 06/06/17 10:04 PM  Result Value Ref Range   Glucose-Capillary 82 65 - 99 mg/dL  Comprehensive metabolic panel     Status: Abnormal   Collection Time: 06/07/17  5:05 AM  Result Value Ref Range   Sodium 136 135 - 145 mmol/L   Potassium 3.9 3.5 - 5.1 mmol/L   Chloride 112 (H) 101 - 111 mmol/L   CO2 14 (L) 22 - 32 mmol/L   Glucose, Bld 104 (H) 65 - 99 mg/dL   BUN 43 (H) 6 - 20 mg/dL   Creatinine, Ser 3.86 (H) 0.44 - 1.00 mg/dL   Calcium 7.9 (L) 8.9 - 10.3 mg/dL   Total Protein 5.2 (L) 6.5 - 8.1 g/dL   Albumin 2.5 (L) 3.5 - 5.0 g/dL   AST 118 (H) 15 - 41 U/L   ALT 239 (H) 14 - 54 U/L   Alkaline Phosphatase 91 38 - 126 U/L   Total Bilirubin 0.8 0.3 - 1.2 mg/dL   GFR calc non Af Amer 11 (L) >60 mL/min   GFR calc Af Amer 13 (L) >60 mL/min    Comment: (NOTE) The eGFR has been calculated using the CKD EPI equation. This calculation has not been validated in all clinical situations. eGFR's persistently <60 mL/min signify possible Chronic Kidney Disease.    Anion gap 10 5 - 15  CBC     Status: Abnormal   Collection Time: 06/07/17  5:05 AM  Result Value Ref Range   WBC 5.6 4.0 - 10.5 K/uL   RBC 3.35 (L) 3.87 - 5.11 MIL/uL   Hemoglobin 10.1 (L) 12.0 - 15.0 g/dL   HCT 32.4 (L) 36.0 - 46.0 %   MCV 96.7 78.0 - 100.0 fL   MCH 30.1 26.0 - 34.0 pg   MCHC 31.2 30.0 - 36.0 g/dL   RDW 14.9 11.5 - 15.5 %   Platelets 96 (L) 150 - 400 K/uL    Comment: CONSISTENT WITH PREVIOUS RESULT  Phosphorus     Status: Abnormal   Collection Time: 06/07/17  5:05 AM  Result Value Ref Range   Phosphorus 5.4 (H) 2.5 - 4.6 mg/dL   Troponin I (q 6hr x 3)     Status: Abnormal   Collection Time: 06/07/17  5:05 AM  Result Value Ref Range   Troponin I 0.70 (HH) <0.03 ng/mL    Comment: CRITICAL VALUE NOTED.  VALUE IS CONSISTENT WITH PREVIOUSLY REPORTED AND CALLED VALUE.  CK total and CKMB (cardiac)not at [facility]     Status: Abnormal   Collection Time: 06/07/17  5:05 AM  Result Value Ref Range   Total CK 498 (H) 38 - 234 U/L   CK, MB 12.4 (H) 0.5 - 5.0 ng/mL   Relative Index 2.5 0.0 - 2.5  Glucose, capillary     Status: Abnormal   Collection Time: 06/07/17  7:31 AM  Result Value Ref Range   Glucose-Capillary 101 (H) 65 - 99 mg/dL   Comment 1 Notify RN   Glucose, capillary     Status: Abnormal   Collection Time: 06/07/17 11:30 AM  Result Value Ref Range   Glucose-Capillary 100 (H) 65 - 99 mg/dL   Comment 1 Notify RN   Glucose, capillary     Status: Abnormal   Collection Time: 06/07/17  4:50 PM  Result  Value Ref Range   Glucose-Capillary 154 (H) 65 - 99 mg/dL   Comment 1 Notify RN     X-ray Chest Pa And Lateral  Result Date: 06/06/2017 CLINICAL DATA:  Cough EXAM: CHEST  2 VIEW COMPARISON:  06/05/2017 FINDINGS: Heart is borderline in size. No confluent airspace opacities or effusions. No acute bony abnormality. IMPRESSION: No active cardiopulmonary disease. Electronically Signed   By: Rolm Baptise M.D.   On: 06/06/2017 07:51   US Renal  Result Date: 06/06/2017 CLINICAL DATA:  Oliguria EXAM: RENAL / URINARY TRACT ULTRASOUND COMPLETE COMPARISON:  None. FINDINGS: Right Kidney: Length: 11.6 cm. Echogenicity within normal limits. No hydronephrosis. Cyst in the midpole measuring 1.8 x 3.1 x 1.8 cm Left Kidney: Length: 11.8 cm. Echogenicity within normal limits. No mass or hydronephrosis visualized. Bladder: Appears normal for degree of bladder distention. Incidentally noted is fatty infiltration of the liver IMPRESSION: 1. Negative for hydronephrosis. Cyst in the midpole of the right kidney 2. Fatty infiltration of the  liver Electronically Signed   By: Donavan Foil M.D.   On: 06/06/2017 16:29   US Abdomen Limited Ruq  Result Date: 06/05/2017 CLINICAL DATA:  Acute onset of elevated LFTs. EXAM: ULTRASOUND ABDOMEN LIMITED RIGHT UPPER QUADRANT COMPARISON:  CTA of the chest, abdomen and pelvis performed 09/28/2013 FINDINGS: Gallbladder: Status post cholecystectomy.  No retained stones seen. Common bile duct: Diameter: 0.7 cm, borderline normal for the patient's age. Liver: No focal lesion identified. Diffusely increased parenchymal echogenicity and coarsened echotexture, likely reflecting fatty infiltration. Portal vein is patent on color Doppler imaging, with bidirectional flow noted in the portal vein. Evaluation is mildly suboptimal due to the patient's habitus. A right renal parapelvic cyst is suggested. IMPRESSION: 1. Status post cholecystectomy.  No retained stones seen. 2. Bidirectional flow noted in the portal vein, without other changes of cirrhosis at this time. 3. Diffuse fatty infiltration within the liver. Electronically Signed   By: Garald Balding M.D.   On: 06/05/2017 21:30    ROS Blood pressure (!) 110/52, pulse 77, temperature 98.4 F (36.9 C), temperature source Oral, resp. rate 18, height '5\' 6"'$  (1.676 m), weight 205 lb 8 oz (93.2 kg), SpO2 95 %. Physical Exam General: AAO x3, NAD  Dermatological: Hyperkeratotic lesion to the distal aspect of the right second toe which is been ongoing.  There is no evidence of dried blood underneath the callus but there is no surrounding erythema, drainage or pus there is no edema to the toe.  There is no area of fluctuance or crepitus.  Mild hyperkeratotic lesion to the left foot metatarsal one without any underlying ulceration drainage or any signs of infection.  There is no open lesions identified bilaterally and there is no clinical signs of infection noted to bilateral lower extremities.  Vascular: Dorsalis Pedis artery and Posterior Tibial artery pedal pulses  are 2/4 bilateral with immedate capillary fill time.There is no pain with calf compression, swelling, warmth, erythema.   Neruologic: Sensation decreased   Musculoskeletal: Previous hallux amputation of the right foot there is prominence of metatarsal heads plantarly.  Hammertoe contractures are present.  Muscular strength 5/5 in all groups tested bilateral.  Gait: Unassisted, Nonantalgic.   Assessment/Plan: Pre-ulcerative callus the right second toe -On evaluation there is no clinical signs of infection noted to bilateral lower extremities and I do not think that this is the cause of sepsis.  The patient is in agreement with this.  I will continue to monitor as an outpatient I will get  her scheduled to see me next week in the office.  Offloading we discussed in the office previously.  There is any changes please feel free to contact me otherwise I will follow-up with her as an outpatient. Thank you for the consult.   Trula Slade 06/07/2017, 7:48 PM  Office: [phone] Cell: [phone]

## 2017-06-07 NOTE — Progress Notes (Signed)
Internal Medicine Attending:   I saw and examined the patient. I reviewed the resident's note and I agree with the resident's findings and plan as documented in the resident's note. Appetite improving, nausea improved.  Overall LFTS improving, SCr still slightly worsening.  Overall suspect hypotension and global hypoperfusion has caused Transaminits, AKI, and mildly elevated troponin.  Will continue IVF. Given AKI will D/C Zosyn and change to Vanc/Cefepime.

## 2017-06-07 NOTE — Progress Notes (Signed)
Pharmacy Antibiotic Note  Laurann Mcmorris is a 66 y.o. female admitted on 06/05/2017 with sepsis.  Pharmacy has been consulted for vancomycin and Cefepime.  Pt with ARF, SCr continues to increase this morning  Plan: Cefepime 1 g IV q24h Hold further doses of Vancomycin  Random vancomycin level with AM labs tomorrow morning   Height: 5\' 6"  (167.6 cm) Weight: 205 lb 8 oz (93.2 kg) IBW/kg (Calculated) : 59.3  Temp (24hrs), Avg:98.6 F (37 C), Min:98.2 F (36.8 C), Max:99.1 F (37.3 C)  Recent Labs  Lab 06/05/17 1812 06/05/17 1826 06/05/17 2201 06/06/17 0100 06/07/17 0505  WBC 4.4  --   --  5.6 5.6  CREATININE 2.86*  --   --  2.94* 3.86*  LATICACIDVEN  --  1.08 0.40*  --   --     Estimated Creatinine Clearance: 16.9 mL/min (A) (by C-G formula based on SCr of 3.86 mg/dL (H)).    Allergies  Allergen Reactions  . Anesthesia S-I-60 Other (See Comments)    Hallucinations.  Yvette Rack [Cyclobenzaprine] Other (See Comments)    Prolonged QTc to 571, tachycardia  . Soma [Carisoprodol] Itching and Rash   Phillis Knack, PharmD, BCPS  06/07/2017 7:37 AM

## 2017-06-07 NOTE — Progress Notes (Signed)
Ambulated with pt is hall about 19ft. Pt tolerated well.

## 2017-06-07 NOTE — Progress Notes (Signed)
Received call from daughter that was asking about patient's oxygen saturation.  Daughter stated that she feels that "her mom is wheezing,and she needs breathing treatment".  Explained daughter that lung sounded clear to me on assessment and that I have not had any complains from the patient, however I will re-assess patient and if needed paged the doctor and request breathing treatment.  On assessment lungs sound clear, lower lung sounds diminished. VS stable, patient denies wheezing, SOB or any other respiratory discomfort. Breathing regular, unlabored.  Patient informed if any change is present to inform nurse, patient verbalized understanding.   Will continue to monitor.  Aristides Luckey, RN

## 2017-06-08 LAB — COMPREHENSIVE METABOLIC PANEL
ALK PHOS: 82 U/L (ref 38–126)
ALT: 166 U/L — AB (ref 14–54)
AST: 50 U/L — AB (ref 15–41)
Albumin: 2.7 g/dL — ABNORMAL LOW (ref 3.5–5.0)
Anion gap: 9 (ref 5–15)
BUN: 38 mg/dL — AB (ref 6–20)
CALCIUM: 8.8 mg/dL — AB (ref 8.9–10.3)
CHLORIDE: 113 mmol/L — AB (ref 101–111)
CO2: 17 mmol/L — AB (ref 22–32)
CREATININE: 3.56 mg/dL — AB (ref 0.44–1.00)
GFR calc non Af Amer: 13 mL/min — ABNORMAL LOW (ref >60)
GFR, EST AFRICAN AMERICAN: 15 mL/min — AB (ref >60)
GLUCOSE: 131 mg/dL — AB (ref 65–99)
Potassium: 4 mmol/L (ref 3.5–5.1)
SODIUM: 139 mmol/L (ref 135–145)
Total Bilirubin: 0.7 mg/dL (ref 0.3–1.2)
Total Protein: 5.8 g/dL — ABNORMAL LOW (ref 6.5–8.1)

## 2017-06-08 LAB — CBC
HCT: 32.7 % — ABNORMAL LOW (ref 36.0–46.0)
Hemoglobin: 10.4 g/dL — ABNORMAL LOW (ref 12.0–15.0)
MCH: 29.9 pg (ref 26.0–34.0)
MCHC: 31.8 g/dL (ref 30.0–36.0)
MCV: 94 fL (ref 78.0–100.0)
PLATELETS: 101 10*3/uL — AB (ref 150–400)
RBC: 3.48 MIL/uL — ABNORMAL LOW (ref 3.87–5.11)
RDW: 14.7 % (ref 11.5–15.5)
WBC: 5.3 10*3/uL (ref 4.0–10.5)

## 2017-06-08 LAB — VANCOMYCIN, RANDOM: VANCOMYCIN RM: 9

## 2017-06-08 LAB — GLUCOSE, CAPILLARY
GLUCOSE-CAPILLARY: 127 mg/dL — AB (ref 65–99)
GLUCOSE-CAPILLARY: 121 mg/dL — AB (ref 65–99)
Glucose-Capillary: 144 mg/dL — ABNORMAL HIGH (ref 65–99)
Glucose-Capillary: 164 mg/dL — ABNORMAL HIGH (ref 65–99)

## 2017-06-08 MED ORDER — DEXAMETHASONE SODIUM PHOSPHATE 10 MG/ML IJ SOLN
10.0000 mg | Freq: Once | INTRAMUSCULAR | Status: AC
  Administered 2017-06-08: 10 mg via INTRAVENOUS
  Filled 2017-06-08: qty 1

## 2017-06-08 MED ORDER — VANCOMYCIN HCL IN DEXTROSE 1-5 GM/200ML-% IV SOLN
1000.0000 mg | INTRAVENOUS | Status: DC
  Administered 2017-06-08: 1000 mg via INTRAVENOUS
  Filled 2017-06-08: qty 200

## 2017-06-08 MED ORDER — METOCLOPRAMIDE HCL 5 MG/ML IJ SOLN
10.0000 mg | Freq: Once | INTRAMUSCULAR | Status: AC
  Administered 2017-06-08: 10 mg via INTRAVENOUS
  Filled 2017-06-08: qty 2

## 2017-06-08 MED ORDER — METOCLOPRAMIDE HCL 5 MG/ML IJ SOLN
15.0000 mg | Freq: Once | INTRAVENOUS | Status: DC
  Filled 2017-06-08: qty 3

## 2017-06-08 MED ORDER — TRAMADOL HCL 50 MG PO TABS
50.0000 mg | ORAL_TABLET | Freq: Once | ORAL | Status: AC
  Administered 2017-06-08: 50 mg via ORAL
  Filled 2017-06-08: qty 1

## 2017-06-08 MED ORDER — LACTATED RINGERS IV SOLN
INTRAVENOUS | Status: AC
Start: 2017-06-08 — End: 2017-06-09
  Administered 2017-06-08 – 2017-06-09 (×2): via INTRAVENOUS

## 2017-06-08 MED ORDER — DIPHENHYDRAMINE HCL 25 MG PO CAPS
25.0000 mg | ORAL_CAPSULE | Freq: Once | ORAL | Status: AC
  Administered 2017-06-08: 25 mg via ORAL
  Filled 2017-06-08: qty 1

## 2017-06-08 NOTE — Plan of Care (Signed)
  Education: Knowledge of General Education information will improve 06/08/2017 0739 - Completed/Met by Evert Kohl, RN

## 2017-06-08 NOTE — Progress Notes (Signed)
Pharmacy Antibiotic Note  [name] is a 66 y.o. female admitted on 06/05/2017 with sepsis.  Pharmacy  consulted for vancomycin day 4 and Cefepime day 2.  Pt with ARF; SCr= 3.56 (down from previous; was 1.0 in 01/2017). Cultures ngtd -12/27: Vanc random= 9 at ~ 6:30am (1500mg  vanc on 12/24 ~ 7-8pm)  Plan: Cefepime 1 g IV q24h Vancomycin 1000mg  IV q48hr Could consider d/c vancomycin? Will follow renal function, cultures and clinical progress   Height: 5\' 6"  (167.6 cm) Weight: 204 lb 3.2 oz (92.6 kg)(a scale) IBW/kg (Calculated) : 59.3  Temp (24hrs), Avg:97.9 F (36.6 C), Min:97.7 F (36.5 C), Max:98.1 F (36.7 C)  Recent Labs  Lab 06/05/17 1812 06/05/17 1826 06/05/17 2201 06/06/17 0100 06/07/17 0505 06/08/17 0621  WBC 4.4  --   --  5.6 5.6 5.3  CREATININE 2.86*  --   --  2.94* 3.86* 3.56*  LATICACIDVEN  --  1.08 0.40*  --   --   --   VANCORANDOM  --   --   --   --   --  9    Estimated Creatinine Clearance: 18.3 mL/min (A) (by C-G formula based on SCr of 3.56 mg/dL (H)).    Allergies  Allergen Reactions  . Anesthesia S-I-60 Other (See Comments)    Hallucinations.  Yvette Rack [Cyclobenzaprine] Other (See Comments)    Prolonged QTc to 571, tachycardia  . Soma [Carisoprodol] Itching and Rash   Hildred Laser, Pharm D 06/08/2017 8:30 AM

## 2017-06-08 NOTE — Progress Notes (Signed)
Subjective: Patient doing well this AM. She feels that her fatigue and muscle aches are improving. She continues to have a severe headache with photophobia. She states that she gets a migraine every few months and this feels like a migraine. She tried tylenol and hydrocodone overnight without relief. She does think there may be an aspect of tension playing a roles and she has been in a hospital bed for several days. We discussed trying a migraine cocktail to provide her with some relief. Discussed that we are waiting on AM labs; however, if her kidney function improves she may be able to get home today. Discussed that unfortunately we cannot say with certainty was caused this episode. She voices understanding and agrees with the plan. All questions and concerns addressed.   Objective: Vital signs in last 24 hours: Vitals:   06/07/17 1154 06/07/17 2038 06/08/17 0304 06/08/17 0607  BP: (!) 110/52 122/65  (!) 114/53  Pulse: 77 74  76  Resp:  16    Temp:  97.7 F (36.5 C)  98.1 F (36.7 C)  TempSrc:  Oral  Oral  SpO2: 95% 95%  94%  Weight:   204 lb 3.2 oz (92.6 kg)   Height:       General: Obese femlae in no acute distress Pulm: Good air movement with no wheezing or crackles  CV: RRR, no murmurs, no rubs  Abdomen: Active bowel sounds, soft, non-distended, no tenderness to palpation  Extremities: No LE edema   Assessment/Plan:  SIRs + Hypotension. [name] is a 66 y.o female with DM and HTN who presented to the ED with 4 days of generalize malaise, myalgias, headaches, and N/V. She was found to be hypotensive and have a possible source of infection in the oral cavity (necrotic tooth). She was treated empirically with Vancomycin and Zosyn. She had end-organ damage including elevated LFTs, AKI, and troponinemia. Aggressive fluids were initiated and alternative source of infection was looked for. Unfortunately no other source of infection has been identified (negative UA, CXR, blood  cultures, urine cultures, influenza, EBV, CMV, podiatry consult for pre-ulcer on right 2nd digit, and hepatitis panel). Her hypotension has resolved and labs have began to normalize and we are on day #4 of empiric antibiotic coverage.  - Continuing Vanc and Cefepime  Transaminitis. The patient presented with elevated LFTs, a normal bilirubin, and a normal coagulation panel consistent with shock liver 2/2 prolonged hypotension. She was treated aggressively with IVF and her LFTs have since begun to trend back towards normal. Her hepatitis panel is negative.   AKI. The patient presented with an elevated creatinine of 2.86 which then peaked at 3.86. Her baseline creatinine prior to presentation was approximately 1.0. UA was significant for proteinuria but no signs of occult glomerular disease. Microscopy illustrated granular casts. She subsequently became oliguric before experiencing a significant phase of diureses. Her presentation is consistent with prolonged hypotension leading to ATN.  - Creatinine improved today   Type 2 NSTEMI. The patient presented with an elevated tyroponin of 0.04 that eventually peaked at 1.02. Her EKG was unchanged from prior with ST depression in the inferior leads and T wave inversion in V5 and V6. Echocardiogram was obtained the illustrated a EF of 65-70% with G2DD. This presentation is consistent with a Type II NSTEMI.   Normocytic Anemia  - Hgb 10.4 this AM, stable compared to prior  - RDW within normal limits   Thrombocytopenia  - Patient has thrombocytopenia since April 2018  DM -  Continue sliding scale insulin   HTN - Currently hypotensive, holding BP medicines  - IVF to maintain MAP > 65  Migraine Headache  - Reglan and benadryl   Dispo: Anticipated discharge in approximately 1 day(s).   Ina Homes, MD 06/08/2017, 6:13 AM My Pager: [phone]

## 2017-06-08 NOTE — Progress Notes (Signed)
Ambulated halfway down the hall with pt.  Pt tolerated well.  Used front wheel walker.

## 2017-06-09 ENCOUNTER — Inpatient Hospital Stay (HOSPITAL_COMMUNITY): Payer: Medicare Other

## 2017-06-09 DIAGNOSIS — R6511 Systemic inflammatory response syndrome (SIRS) of non-infectious origin with acute organ dysfunction: Secondary | ICD-10-CM

## 2017-06-09 DIAGNOSIS — N179 Acute kidney failure, unspecified: Secondary | ICD-10-CM

## 2017-06-09 DIAGNOSIS — G43909 Migraine, unspecified, not intractable, without status migrainosus: Secondary | ICD-10-CM | POA: Diagnosis not present

## 2017-06-09 LAB — COMPREHENSIVE METABOLIC PANEL
ALBUMIN: 2.7 g/dL — AB (ref 3.5–5.0)
ALT: 122 U/L — ABNORMAL HIGH (ref 14–54)
ANION GAP: 8 (ref 5–15)
AST: 32 U/L (ref 15–41)
Alkaline Phosphatase: 76 U/L (ref 38–126)
BILIRUBIN TOTAL: 0.7 mg/dL (ref 0.3–1.2)
BUN: 31 mg/dL — ABNORMAL HIGH (ref 6–20)
CHLORIDE: 111 mmol/L (ref 101–111)
CO2: 19 mmol/L — ABNORMAL LOW (ref 22–32)
Calcium: 9.1 mg/dL (ref 8.9–10.3)
Creatinine, Ser: 2.82 mg/dL — ABNORMAL HIGH (ref 0.44–1.00)
GFR calc Af Amer: 19 mL/min — ABNORMAL LOW (ref >60)
GFR calc non Af Amer: 17 mL/min — ABNORMAL LOW (ref >60)
GLUCOSE: 187 mg/dL — AB (ref 65–99)
POTASSIUM: 3.7 mmol/L (ref 3.5–5.1)
SODIUM: 138 mmol/L (ref 135–145)
TOTAL PROTEIN: 5.6 g/dL — AB (ref 6.5–8.1)

## 2017-06-09 LAB — CBC
HEMATOCRIT: 31.5 % — AB (ref 36.0–46.0)
HEMOGLOBIN: 10.2 g/dL — AB (ref 12.0–15.0)
MCH: 29.9 pg (ref 26.0–34.0)
MCHC: 32.4 g/dL (ref 30.0–36.0)
MCV: 92.4 fL (ref 78.0–100.0)
Platelets: 126 10*3/uL — ABNORMAL LOW (ref 150–400)
RBC: 3.41 MIL/uL — ABNORMAL LOW (ref 3.87–5.11)
RDW: 14.5 % (ref 11.5–15.5)
WBC: 5.7 10*3/uL (ref 4.0–10.5)

## 2017-06-09 LAB — GLUCOSE, CAPILLARY
GLUCOSE-CAPILLARY: 189 mg/dL — AB (ref 65–99)
Glucose-Capillary: 160 mg/dL — ABNORMAL HIGH (ref 65–99)
Glucose-Capillary: 212 mg/dL — ABNORMAL HIGH (ref 65–99)
Glucose-Capillary: 166 mg/dL — ABNORMAL HIGH (ref 65–99)

## 2017-06-09 LAB — RSV(RESPIRATORY SYNCYTIAL VIRUS) AB, BLOOD: RSV Ab: NEGATIVE

## 2017-06-09 MED ORDER — GABAPENTIN 600 MG PO TABS
1200.0000 mg | ORAL_TABLET | Freq: Three times a day (TID) | ORAL | Status: DC
Start: 2017-06-09 — End: 2017-06-10
  Administered 2017-06-09 – 2017-06-10 (×4): 1200 mg via ORAL
  Filled 2017-06-09 (×4): qty 2

## 2017-06-09 MED ORDER — AMLODIPINE BESYLATE 2.5 MG PO TABS
2.5000 mg | ORAL_TABLET | Freq: Every day | ORAL | Status: DC
  Administered 2017-06-09 – 2017-06-10 (×2): 2.5 mg via ORAL
  Filled 2017-06-09 (×2): qty 1

## 2017-06-09 MED ORDER — SUMATRIPTAN SUCCINATE 25 MG PO TABS
25.0000 mg | ORAL_TABLET | ORAL | Status: AC | PRN
  Administered 2017-06-09 (×2): 25 mg via ORAL
  Filled 2017-06-09 (×4): qty 1

## 2017-06-09 MED ORDER — ASPIRIN-ACETAMINOPHEN-CAFFEINE 250-250-65 MG PO TABS
1.0000 | ORAL_TABLET | Freq: Once | ORAL | Status: AC
  Administered 2017-06-09: 1 via ORAL
  Filled 2017-06-09: qty 1

## 2017-06-09 NOTE — Care Management Important Message (Signed)
Important Message  Patient Details  Name: [name] MRN: [id number] Date of Birth: 07-05-1950   Medicare Important Message Given:  Yes    [name] Montine Circle 06/09/2017, 12:38 PM

## 2017-06-09 NOTE — Progress Notes (Signed)
06/09/2017- Patient is ready for discharge home per Attending MD but patient is refusing to go due to headache. Medical Director Dr Burnard Bunting made aware, pt medically stable for discharge home. HINN 12 given to patient as requested by Medical Director; Attending MD made aware and is in agreement. Letter explained to the patient, all questions answered, patient refused to sign letter. Mindi Slicker RN Case Management

## 2017-06-09 NOTE — Plan of Care (Signed)
  Pain Managment: General experience of comfort will improve 06/09/2017 0419 - Completed/Met by Evert Kohl, RN

## 2017-06-09 NOTE — Progress Notes (Signed)
Internal Medicine Attending:   I saw and examined the patient. I reviewed the resident's note and I agree with the resident's findings and plan as documented in the resident's note. Patient is overall feeling well her only complaint today is a headache she reports that the left side behind the eye associated with mild photophobia she reports this is similar to migraine/cluster headaches in the past however she reports she has not had a migraine headache in many years.  He denies any neck pain or stiffness, no nausea or vomiting.  Tylenol was not helping.  Gust with her trial of Excedrin Migraine CT of the head which returned normal.  Overall I do not feel that her headache would warrant a lumbar puncture given the relatively mild nature and no other meningeal signs.  I followed up with her later in the day the Excedrin did not fully resolve her headache we will try Imitrex 25 mg once if not resolved will get a second dose.  If this does resolve her headache she is stable to go.  I gave her strict return precautions that if she starts feeling increasingly fatigued febrile or other new symptoms, she needs to come back but at this time I do not think that she needs any more antibiotics since our workup to the cause of her hypotension has been negative and she is recovering well.

## 2017-06-09 NOTE — Progress Notes (Signed)
Subjective:  No acute events overnight. Patient complains of an ongoing migraine for the past 3 days that is not relieved by Tylenol and migraine cocktail. She endorses photophobia and changes in vision. Denies phonophobia, nausea, and vomiting. She also complains of upper abdominal pain that started today. Reports good appetite. Discussed with patient improvement in renal function and likelihood of discharge home today. Return precautions discussed. Stressed importance of returning to ED if she develops similar symptoms as the etiology of sepsis remains unclear.   Objective: Vital signs in last 24 hours: Vitals:   06/08/17 0607 06/08/17 1200 06/08/17 1327 06/08/17 2253  BP: (!) 114/53 (!) 152/76  (!) 162/69  Pulse: 76 71  73  Resp:  18  16  Temp: 98.1 F (36.7 C) 97.9 F (36.6 C) 97.9 F (36.6 C) 98.3 F (36.8 C)  TempSrc: Oral Oral Oral Oral  SpO2: 94% 98%  96%  Weight:      Height:       General: elderly female, initially sitting up in commode having a BM  Cardiac: regular rate and rhythm, nl S1/S2, no murmurs, rubs or gallops  Pulm: CTAB, no wheezes or crackles, no increased work of breathing  Abd: obese, soft, tenderness over epigastric region, normoactive bowel sounds  Neuro: A&Ox3, no focal deficits noted  Ext: warm and well perfused, no peripheral edema bilaterally   Assessment/Plan:  [name] is a 66 y.o female with DM and HTN who presented to the ED with 4 days of generalize malaise, myalgias, headaches, and N/V.   # SIRs + Hypotension: Initially hypotensive and found to have AKI, transaminitis, and troponinemia. Source of infection remains unclear, ?possibly in the oral cavity (necrotic tooth). Overall stable and improving. Currently normotensive and renal function improving. Vancomycin and cefepime d/c 12/27. - Vancomycin and Cefepimediscontinued 12/27 - Likely discharge home today vs tomorrow pending head CT  - Return precautions discussed   # Migraine Headache:  Ongoing for 3 days. Associated with changes in vision and photophobia. Not relieved with Tylenol or migraine cocktail. She does have a past history of migraines associated with menstrual periods that resolved after hysterectomy. Will give one dose of Excedrin (home med). Head CT ordered for further evaluation as source of sepsis remain unclear, though low suspicion of meningitis at this time given marked improvement in clinical status.  - Excedrin 1 tablet x1  - Follow-up head CT   # AKI: Likely secondary to ATN in the setting of hypotension. SCr slowly improving. Cr 2.8 this AM. Adequate urine output. - Continue to monitor renal function   # Transaminitis: The patient presented with elevated LFTs, a normal bilirubin, and a normal coagulation panel consistent with shock liver 2/2 prolonged hypotension. She was treated aggressively with IVF and her LFTs have since begun to trend back towards normal. Her hepatitis panel is negative.   # Type 2 NSTEMI: The patient presented with an elevated tyroponin of 0.04 that eventually peaked at 1.02. Her EKG was unchanged from prior with ST depression in the inferior leads and T wave inversion in V5 and V6. Echocardiogram was obtained the illustrated a EF of 65-70% with G2DD.  # Normocytic Anemia: chronic and stable. Hgb 10.2 this AM  - Continue to monitor   # Thrombocytopenia: chronic, documented since 09/2016 - Continue to monitor   # T2DM:  - Continue sliding scale insulin   # HTN: sBP 150-160s - Resume home amlodipine 2.5 mg QD   Dispo: Anticipated discharge in approximately 1 day(s).  Welford Roche, MD 06/09/2017, 6:37 AM My Pager: [phone]

## 2017-06-09 NOTE — Plan of Care (Signed)
  Activity: Risk for activity intolerance will decrease 06/09/2017 0418 - Completed/Met by Evert Kohl, RN   Nutrition: Adequate nutrition will be maintained 06/09/2017 0418 - Completed/Met by Evert Kohl, RN   Coping: Level of anxiety will decrease 06/09/2017 0418 - Completed/Met by Evert Kohl, RN   Elimination: Will not experience complications related to bowel motility 06/09/2017 0418 - Completed/Met by Evert Kohl, RN

## 2017-06-09 NOTE — Progress Notes (Signed)
Pt has discharge order in but refused to leave, claim she has  Headache, gave order medications for headache.

## 2017-06-09 NOTE — Plan of Care (Signed)
  Elimination: Will not experience complications related to bowel motility 06/09/2017 0418 - Completed/Met by Evert Kohl, RN

## 2017-06-09 NOTE — Plan of Care (Signed)
  Pain Managment: General experience of comfort will improve 06/09/2017 0419 - Completed/Met by Evert Kohl, RN   Elimination: Will not experience complications related to bowel motility 06/09/2017 0418 - Completed/Met by Evert Kohl, RN   Coping: Level of anxiety will decrease 06/09/2017 0418 - Completed/Met by Evert Kohl, RN   Nutrition: Adequate nutrition will be maintained 06/09/2017 0418 - Completed/Met by Evert Kohl, RN   Activity: Risk for activity intolerance will decrease 06/09/2017 0418 - Completed/Met by Evert Kohl, RN

## 2017-06-10 DIAGNOSIS — R7989 Other specified abnormal findings of blood chemistry: Secondary | ICD-10-CM

## 2017-06-10 DIAGNOSIS — Z888 Allergy status to other drugs, medicaments and biological substances status: Secondary | ICD-10-CM

## 2017-06-10 DIAGNOSIS — Z90710 Acquired absence of both cervix and uterus: Secondary | ICD-10-CM

## 2017-06-10 DIAGNOSIS — A419 Sepsis, unspecified organism: Principal | ICD-10-CM

## 2017-06-10 DIAGNOSIS — Z884 Allergy status to anesthetic agent status: Secondary | ICD-10-CM

## 2017-06-10 DIAGNOSIS — K041 Necrosis of pulp: Secondary | ICD-10-CM

## 2017-06-10 LAB — BASIC METABOLIC PANEL
Anion gap: 11 (ref 5–15)
BUN: 32 mg/dL — AB (ref 6–20)
CALCIUM: 9.1 mg/dL (ref 8.9–10.3)
CO2: 19 mmol/L — ABNORMAL LOW (ref 22–32)
Chloride: 108 mmol/L (ref 101–111)
Creatinine, Ser: 2.5 mg/dL — ABNORMAL HIGH (ref 0.44–1.00)
GFR calc Af Amer: 22 mL/min — ABNORMAL LOW (ref >60)
GFR, EST NON AFRICAN AMERICAN: 19 mL/min — AB (ref >60)
GLUCOSE: 172 mg/dL — AB (ref 65–99)
Potassium: 3.1 mmol/L — ABNORMAL LOW (ref 3.5–5.1)
Sodium: 138 mmol/L (ref 135–145)

## 2017-06-10 LAB — GLUCOSE, CAPILLARY
Glucose-Capillary: 211 mg/dL — ABNORMAL HIGH (ref 65–99)
Glucose-Capillary: 140 mg/dL — ABNORMAL HIGH (ref 65–99)

## 2017-06-10 LAB — CULTURE, BLOOD (ROUTINE X 2)
CULTURE: NO GROWTH
Culture: NO GROWTH
SPECIAL REQUESTS: ADEQUATE
SPECIAL REQUESTS: ADEQUATE

## 2017-06-10 MED ORDER — POTASSIUM CHLORIDE CRYS ER 20 MEQ PO TBCR
40.0000 meq | EXTENDED_RELEASE_TABLET | Freq: Two times a day (BID) | ORAL | Status: DC
  Administered 2017-06-10: 40 meq via ORAL
  Filled 2017-06-10: qty 2

## 2017-06-10 MED ORDER — SUMATRIPTAN SUCCINATE 25 MG PO TABS: ORAL_TABLET | ORAL | 0 refills | Status: DC

## 2017-06-10 MED ORDER — AMLODIPINE BESYLATE 5 MG PO TABS: 5.0000 mg | ORAL_TABLET | Freq: Every day | ORAL | Status: DC

## 2017-06-10 MED ORDER — AMLODIPINE BESYLATE 10 MG PO TABS: 10.0000 mg | ORAL_TABLET | Freq: Every day | ORAL | 0 refills | Status: DC

## 2017-06-10 MED ORDER — AMLODIPINE BESYLATE 5 MG PO TABS
5.0000 mg | ORAL_TABLET | Freq: Once | ORAL | Status: AC
  Administered 2017-06-10: 5 mg via ORAL
  Filled 2017-06-10: qty 1

## 2017-06-10 NOTE — Progress Notes (Signed)
Medicine attending discharge note: I personally examined this patient on the day of discharge and I attest to the accuracy of the discharge evaluation and plan as recorded in the final progress note by resident physician Dr. Isac Sarna.  66 year old woman with hypertension and diabetes who presented on the day of admission December 24 with generalized weakness, myalgias, headaches, nausea and vomiting.  Rectal temp 101.4.  Blood pressure 82/55.  Oxygen saturation 91%.  Lactic acid 1.08, repeat 0.4.  Pro-calcitonin 2.13.  Mild thrombocytopenia.  No elevation of her white count.  Elevated transaminases and alkaline phosphatase with initial AST 1048, ALT 723, alkaline phosphatase 151, and total bilirubin 1.4.  Acute renal insufficiency with BUN 30, creatinine 2.9.  6-30 white cells in the urine. Troponin elevated 1.02 without any acute ischemic changes on EKG. Chest x-ray without infiltrate or effusion. She was treated for sepsis.  Broad-spectrum antibiotics with vancomycin and Zosyn.  Hydration. She improved clinically with stabilization of her blood pressure.  Creatinine peaked at 3.9 and trend for improvement at discharge with creatinine 2.5.  Liver function abnormalities almost completely normalized by December 28.  All cultures remain sterile and precise etiology of the infection is unclear. Transient elevation of troponin felt to be demand ischemia related to septic shock.  Disposition: Condition stable at time of discharge Follow-up in our General medicine clinic There were no complications

## 2017-06-10 NOTE — Progress Notes (Signed)
Discharge instructions reviewed with pt. Pt verbalizes understanding. Pt's daughter is coming to drive her home.

## 2017-06-10 NOTE — Progress Notes (Signed)
Pt discharged via wheelchair. Pt belongings with pt. Pt is not in distress.

## 2017-06-10 NOTE — Plan of Care (Signed)
  Clinical Measurements: Will remain free from infection 06/10/2017 0855 - Completed/Met by Evert Kohl, RN

## 2017-06-10 NOTE — Progress Notes (Signed)
   Subjective:  No acute events overnight.  Medically stable.  States sumatriptan resolved her headache.  Comfortable going home today.  Once again discussed return precautions.  Patient voiced understanding and is in agreement with plan.  Objective:  Vital signs in last 24 hours: Vitals:   06/09/17 1212 06/09/17 2032 06/10/17 0448 06/10/17 0803  BP: (!) 154/79 (!) 163/67 (!) 168/74 (!) 173/78  Pulse: 64 69 (!) 57 60  Resp: 18 18 18    Temp: 98.5 F (36.9 C) 99 F (37.2 C) 98 F (36.7 C)   TempSrc: Oral Oral Oral   SpO2: 99% 96% 100%   Weight:   198 lb 8 oz (90 kg)   Height:       General: obese female, well-developed, lying In bed watching TV in no acute distress  CV: regular rate and rhythm, nl S1/S2, no murmurs, rubs or gallops  Abd: soft, NTND, normoactive bowel sounds  Neuro: A&Ox3, no focal deficits noted   Ext: warm and well perfused, no peripheral edema bilaterally  Assessment/Plan:  Barot is a 66 y.o female with DM and HTN who presented to the ED with 4 days of generalize malaise, myalgias, headaches, and N/V.   # SIRs + Hypotension: Initially hypotensive and found to have AKI, transaminitis, and troponinemia. Source of infection remains unclear, ?possibly in the oral cavity (necrotic tooth).  Remained stable.  Afebrile hemodynamically stable.  Currently hypertensive with SBP 160s-170s in the setting of volume repletion for hypotension.  Will increase her amlodipine to 10 mg as described below.  - Vancomycin andCefepimediscontinued 12/27 - Discharge home today - Return precautions discussed   # Migraine Headache: Head CT negative. Resolved with sumatriptan. Denied HA this AM.  - Will discharge home with sumatriptan   # AKI: Likely secondary to ATN in the setting of hypotension. SCr slowly improving. Cr 2.5 this AM. Continues to have adequate urine output. - Continue to monitor renal function   # Transaminitis: The patient presented with elevated LFTs, a  normal bilirubin, and a normal coagulation panel consistent with shock liver 2/2 prolonged hypotension. She was treated aggressively with IVF and her LFTs have since normalized. Her hepatitis panel is negative.    # Type 2 NSTEMI: The patient presented with an elevated tyroponin of 0.04 that eventually peaked at 1.02. Her EKG was unchanged from prior with ST depression in the inferior leads and T wave inversion in V5 and V6. Echocardiogram was obtained the illustrated a EF of 65-70% with G2DD.  # Normocytic Anemia: chronic and stable. Hgb 10.2 this AM  - Continue to monitor   # Thrombocytopenia: chronic, documented since 09/2016 - Continue to monitor   # T2DM:  - Continue sliding scale insulin  - Patient instructed to stop taking metformin and Januvia in setting of AKI.  Will follow up in IM clinic, reassess kidney function and resume medications if indicated.  # HTN: sBP 160s-170s - Will increase amlodipine to 10 mg since we are stopping lisinopril in the setting of AKI     Dispo: Anticipated discharge today.   Welford Roche, MD 06/10/2017, 10:09 AM Pager: [phone]

## 2017-06-12 NOTE — Care Management Note (Signed)
Case Management Note  Patient Details  Name: [name] MRN: [id number] Date of Birth: 1951-03-16  Subjective/Objective:                    Action/Plan:   Expected Discharge Date:  06/09/17               Expected Discharge Plan:     In-House Referral:     Discharge planning Services     Post Acute Care Choice:    Choice offered to:     DME Arranged:    DME Agency:     HH Arranged:    HH Agency:     Status of Service:     If discussed at [employer], dates discussed:    Additional Comments:  Royston Bake, RN 06/12/2017, 11:52 AM

## 2017-06-16 ENCOUNTER — Other Ambulatory Visit: Payer: Self-pay

## 2017-06-16 ENCOUNTER — Encounter: Payer: Self-pay | Admitting: Internal Medicine

## 2017-06-16 ENCOUNTER — Ambulatory Visit (INDEPENDENT_AMBULATORY_CARE_PROVIDER_SITE_OTHER): Payer: Medicare Other | Admitting: Internal Medicine

## 2017-06-16 VITALS — BP 131/86 | HR 71 | Temp 98.1°F | Ht 66.0 in | Wt 189.0 lb

## 2017-06-16 DIAGNOSIS — Z862 Personal history of diseases of the blood and blood-forming organs and certain disorders involving the immune mechanism: Secondary | ICD-10-CM | POA: Diagnosis not present

## 2017-06-16 DIAGNOSIS — E785 Hyperlipidemia, unspecified: Secondary | ICD-10-CM

## 2017-06-16 DIAGNOSIS — Z79899 Other long term (current) drug therapy: Secondary | ICD-10-CM

## 2017-06-16 DIAGNOSIS — Z7982 Long term (current) use of aspirin: Secondary | ICD-10-CM

## 2017-06-16 DIAGNOSIS — Z87828 Personal history of other (healed) physical injury and trauma: Secondary | ICD-10-CM

## 2017-06-16 DIAGNOSIS — N179 Acute kidney failure, unspecified: Secondary | ICD-10-CM

## 2017-06-16 DIAGNOSIS — E118 Type 2 diabetes mellitus with unspecified complications: Secondary | ICD-10-CM

## 2017-06-16 DIAGNOSIS — F17211 Nicotine dependence, cigarettes, in remission: Secondary | ICD-10-CM

## 2017-06-16 DIAGNOSIS — I252 Old myocardial infarction: Secondary | ICD-10-CM

## 2017-06-16 DIAGNOSIS — I248 Other forms of acute ischemic heart disease: Secondary | ICD-10-CM

## 2017-06-16 DIAGNOSIS — J449 Chronic obstructive pulmonary disease, unspecified: Secondary | ICD-10-CM | POA: Diagnosis not present

## 2017-06-16 DIAGNOSIS — R233 Spontaneous ecchymoses: Secondary | ICD-10-CM

## 2017-06-16 DIAGNOSIS — N390 Urinary tract infection, site not specified: Secondary | ICD-10-CM

## 2017-06-16 DIAGNOSIS — D692 Other nonthrombocytopenic purpura: Secondary | ICD-10-CM | POA: Diagnosis not present

## 2017-06-16 DIAGNOSIS — K0889 Other specified disorders of teeth and supporting structures: Secondary | ICD-10-CM

## 2017-06-16 DIAGNOSIS — Z8619 Personal history of other infectious and parasitic diseases: Secondary | ICD-10-CM | POA: Diagnosis not present

## 2017-06-16 DIAGNOSIS — I10 Essential (primary) hypertension: Secondary | ICD-10-CM | POA: Diagnosis not present

## 2017-06-16 DIAGNOSIS — R300 Dysuria: Secondary | ICD-10-CM | POA: Insufficient documentation

## 2017-06-16 DIAGNOSIS — Z7984 Long term (current) use of oral hypoglycemic drugs: Secondary | ICD-10-CM

## 2017-06-16 NOTE — Assessment & Plan Note (Signed)
Patient with AKI on admission thought to be 2/2 ATN from hypotension insetting of sepsis. She also received Vac/Zosyn. She was oliguric however urine output began to improve and Cr began improving with aggressive IV hydration. Cr at discharge was 2.5 (peaked at 3.86). Baseline is 1.00.  Plan: --f/u [facility] --restart meds that were held (lisinopril, metformin, sitagliptin) as appropriate based on renal function.

## 2017-06-16 NOTE — Progress Notes (Signed)
CC: dysuria  HPI:  [name] is a 67 y.o. with a PMH of COPD, HTN, HLD, T2DM presenting to clinic for hospital follow up for sepsis and evaluation of dysuria.  Patient was hospitalized 12/24-12/29 for sepsis with evidence of multiorgan damage (AKI, transaminitis, T2NSTEMI). Only source of infection that was found was a necrotic tooth, otherwise she had a negative UA, CXR, Blood cultures, urine cultures, flu panel, EBV, CMV, hepatitis panel. She was initially treated with vanc/zosyn however this was discontinued on discharge as patient had recovered and no clear source of infection was identified. Patient states she has not made an appt to see a dentist yet and does not feel that she has to get the inciting tooth managed.   AKI: Patient with AKI on admission thought to be 2/2 ATN from hypotension insetting of sepsis. She also received Vac/Zosyn. She was oliguric however urine output began to improve and Cr began improving with aggressive IV hydration. Cr at discharge was 2.5 (peaked at 3.86). Baseline is 1.00.  T2 NSTEMI in setting of sepsis: Patient denies chest pain, shortness of breath. She had echo during admission which showed a normal EF and no regional wall motion abnormalities.   Dysuria: Patient endorses a couple day history of dysuria and urgency; she denies vaginal discharge, vaginal itching, hematuria, suprapubic pain/pressure, nausea, vomiting, fevers, chills, flank pain.  Petechia and purpura: Patient endorses petechiae and purpura on bil forearms which she thinks started after discharge. In the hospital patient had thrombocytopenia (nadir at 96) likely in setting of sepsis and liver injury.   Please see problem based Assessment and Plan for status of patients chronic conditions.  Past Medical History:  Diagnosis Date  . Allergy   . Anemia   . Arthritis    "left knee" (09/08/2015)  . Asthma   . Brachial plexus disorders   . CHF (congestive heart failure) ([place])     . Chronic pain    neck pain, headache, neuropathy  . COPD (chronic obstructive pulmonary disease) ([place])    SEES ONLY DR. PATEL   . Depression    "when my husband passed in 2013"  . GERD (gastroesophageal reflux disease)   . Hypertension   . Hypertriglyceridemia   . Migraine    "monthly" (09/08/2015)  . Neuromuscular disorder (HCC)    neuropathy  . Puncture wound of foot, right 05/17/2012   Tetanus shot 3 yrs ago at [facility] in [place], per pt report   . Stroke ([place])    TIAS    IN [place]   4 YRS AGO   . Type II diabetes mellitus ([place]) 2010   diagnosed around 2010, only ever on metformin    Review of Systems:   ROS Per HPI  Physical Exam:  Vitals:   06/16/17 1041  BP: 131/86  Pulse: 71  Temp: 98.1 F (36.7 C)  TempSrc: Oral  SpO2: 100%  Weight: 189 lb (85.7 kg)  Height: 5\' 6"  (1.676 m)   GENERAL- alert, co-operative, appears older than stated age, not in any distress. HEENT- Atraumatic, normocephalic, EOMI, oral mucosa appears moist, partially missing tooth #27 with no evidence of gumline abscess CARDIAC- RRR, no murmurs, rubs or gallops. RESP- Moving equal volumes of air, and clear to auscultation bilaterally, no wheezes or crackles. ABDOMEN- Soft, nontender, bowel sounds present. NEURO- No obvious Cr N abnormality. EXTREMITIES- pulse 2+, symmetric, no pedal edema. SKIN- ecchymoses from venipuncture sites; purpura and petechiae on bil UEs PSYCH- Normal mood and affect, appropriate thought  content and speech.  Assessment & Plan:   See Encounters Tab for problem based charting.  Hypertension Patient continues to take amlodipine 10mg  daily (increased from 2.5mg  daily at discharge) with BP within normal range today. She was advised to hold her lisinopril until follow up due to her AKI. She report home BPs consistently in 150s.   Plan: --continue amlodipine 10mg  daily --f/u Bmet, if AKI resolved can restart lisinopril, however can probably reduce dose from  40mg  since amlodipine was increased.  Diabetes mellitus type 2, controlled, with complications [facility]) Patient asked to hold her sitagliptin and metformin in setting of AKI; she has continued to take her glipizide 5mg  daily.  Plan: --f/u Bmet, if renal function recovering adequately then will plan to restart sitagliptin and metformin.  AKI (acute kidney injury) [facility]) Patient with AKI on admission thought to be 2/2 ATN from hypotension insetting of sepsis. She also received Vac/Zosyn. She was oliguric however urine output began to improve and Cr began improving with aggressive IV hydration. Cr at discharge was 2.5 (peaked at 3.86). Baseline is 1.00.  Plan: --f/u Bmet --restart meds that were held (lisinopril, metformin, sitagliptin) as appropriate based on renal function.  Dysuria Patient endorses a couple day history of dysuria and urgency; she denies vaginal discharge, vaginal itching, hematuria, suprapubic pain/pressure, nausea, vomiting, fevers, chills, flank pain.  Patient unable to provide urine sample today, however symptoms consistent with UTI. Patient denies abx allergies and states has previously been treated with bactrim and macrobid; she denies having prolonged treatments or need to switch antibiotics previously.  Plan: --f/u Bmet and will start antibiotic based on renal function  Demand ischemia ([place]) In setting of sepsis. Patient denies current chest pain, shortness of breath. She had echo during admission which showed a normal EF and no regional wall motion abnormalities.  Petechiae Petechia and purpura: Patient endorses petechiae and purpura on bil forearms which she thinks started after discharge. In the hospital patient had thrombocytopenia (nadir at 96) likely in setting of sepsis and liver injury.  Plan: --f/u CBC for platelets   Patient discussed with Dr. Gerrit Friends, MD Internal Medicine PGY2

## 2017-06-16 NOTE — Patient Instructions (Signed)
FOLLOW-UP INSTRUCTIONS When: keep your appt with Dr. Maricela Bo Feb 8th For: diabetes and blood pressure follow up  What to bring: your medications  I'll call you with the results of your blood tests when they are available and discuss whether you can restart your medicines based on the results.

## 2017-06-16 NOTE — Assessment & Plan Note (Addendum)
Patient asked to hold her sitagliptin and metformin in setting of AKI; she has continued to take her glipizide 19m daily.  Plan: --f/u Bmet, if renal function recovering adequately then will plan to restart sitagliptin and metformin.  Addendum: CrCl 54 and eGFR 40  - will restart sitagliptin at 579mdaily (recommended dosage adjustment) and hold off on restarting metformin until f/u in 1 month

## 2017-06-16 NOTE — Assessment & Plan Note (Addendum)
Patient endorses a couple day history of dysuria and urgency; she denies vaginal discharge, vaginal itching, hematuria, suprapubic pain/pressure, nausea, vomiting, fevers, chills, flank pain.  Patient unable to provide urine sample today, however symptoms consistent with UTI. Patient denies abx allergies and states has previously been treated with bactrim and macrobid; she denies having prolonged treatments or need to switch antibiotics previously.  Plan: --f/u Bmet and will start antibiotic based on renal function  Addendum: CrCl 54 - will start keflex 500mg  BID x5d

## 2017-06-16 NOTE — Assessment & Plan Note (Addendum)
Petechia and purpura: Patient endorses petechiae and purpura on bil forearms which she thinks started after discharge. In the hospital patient had thrombocytopenia (nadir at 96) likely in setting of sepsis and liver injury.  Plan: --f/u CBC for platelets  Addendum: Plts 244; will continue to monitor

## 2017-06-16 NOTE — Assessment & Plan Note (Signed)
In setting of sepsis. Patient denies current chest pain, shortness of breath. She had echo during admission which showed a normal EF and no regional wall motion abnormalities.

## 2017-06-16 NOTE — Assessment & Plan Note (Addendum)
Patient continues to take amlodipine 10mg  daily (increased from 2.5mg  daily at discharge) with BP within normal range today. She was advised to hold her lisinopril until follow up due to her AKI. She report home BPs consistently in 150s.   Plan: --continue amlodipine 10mg  daily --f/u Bmet, if AKI resolved can restart lisinopril, however can probably reduce dose from 40mg  since amlodipine was increased.  Addendum: CrCl>30 so no dosage adjustment necessary; will start lisinopril 10mg  daily. Patient will f/u with PCP in 1 month for BP check

## 2017-06-17 LAB — CBC
HEMATOCRIT: 33 % — AB (ref 34.0–46.6)
Hemoglobin: 10.8 g/dL — ABNORMAL LOW (ref 11.1–15.9)
MCH: 29.8 pg (ref 26.6–33.0)
MCHC: 32.7 g/dL (ref 31.5–35.7)
MCV: 91 fL (ref 79–97)
PLATELETS: 224 10*3/uL (ref 150–379)
RBC: 3.62 x10E6/uL — AB (ref 3.77–5.28)
RDW: 14.6 % (ref 12.3–15.4)
WBC: 10.2 10*3/uL (ref 3.4–10.8)

## 2017-06-17 LAB — BMP8+ANION GAP
ANION GAP: 18 mmol/L (ref 10.0–18.0)
BUN / CREAT RATIO: 19 (ref 12–28)
BUN: 26 mg/dL (ref 8–27)
CO2: 21 mmol/L (ref 20–29)
CREATININE: 1.4 mg/dL — AB (ref 0.57–1.00)
Calcium: 9.5 mg/dL (ref 8.7–10.3)
Chloride: 107 mmol/L — ABNORMAL HIGH (ref 96–106)
GFR calc Af Amer: 46 mL/min/{1.73_m2} — ABNORMAL LOW (ref >59)
GFR, EST NON AFRICAN AMERICAN: 40 mL/min/{1.73_m2} — AB (ref >59)
Glucose: 198 mg/dL — ABNORMAL HIGH (ref 65–99)
Potassium: 3.6 mmol/L (ref 3.5–5.2)
SODIUM: 146 mmol/L — AB (ref 134–144)

## 2017-06-17 MED ORDER — CEPHALEXIN 500 MG PO CAPS: 500.0000 mg | ORAL_CAPSULE | Freq: Two times a day (BID) | ORAL | 0 refills | Status: AC

## 2017-06-17 MED ORDER — LISINOPRIL 10 MG PO TABS: 10.0000 mg | ORAL_TABLET | Freq: Every day | ORAL | 2 refills | Status: DC

## 2017-06-17 MED ORDER — SITAGLIPTIN PHOSPHATE 50 MG PO TABS: 50.0000 mg | ORAL_TABLET | Freq: Every day | ORAL | 1 refills | Status: DC

## 2017-06-17 NOTE — Addendum Note (Signed)
Addended by: Alphonzo Grieve on: 06/17/2017 11:05 AM   Modules accepted: Orders

## 2017-06-19 ENCOUNTER — Telehealth: Payer: Self-pay | Admitting: Internal Medicine

## 2017-06-19 DIAGNOSIS — E1142 Type 2 diabetes mellitus with diabetic polyneuropathy: Secondary | ICD-10-CM

## 2017-06-19 MED ORDER — SERTRALINE HCL 50 MG PO TABS: 50.0000 mg | ORAL_TABLET | Freq: Every day | ORAL | 2 refills | Status: DC

## 2017-06-19 MED ORDER — GABAPENTIN 600 MG PO TABS: 600.0000 mg | ORAL_TABLET | Freq: Two times a day (BID) | ORAL | 0 refills | Status: DC

## 2017-06-19 NOTE — Progress Notes (Signed)
Internal Medicine Clinic Attending  Case discussed with Dr. Svalina  at the time of the visit.  We reviewed the resident's history and exam and pertinent patient test results.  I agree with the assessment, diagnosis, and plan of care documented in the resident's note.  

## 2017-06-19 NOTE — Telephone Encounter (Signed)
Patient is requesting refills on Gabapentin 600mg  and Zoloft.  Patient also says physician call on Saturday and she would like the physician to call her back also.

## 2017-06-19 NOTE — Telephone Encounter (Signed)
Called patient back to discuss medication changes noted in addendums for office visit on 06/16/17; confirmed that I had sent in new scripts for lisinopril 10mg  daily and sitagliptin 50mg  daily, as well as keflex 500mg  BID.   She requested refill on gabapentin; she states she is taking 1200mg  TID; with renal function (CrCl 54), will decrease to 600mg  BID until f/u and repeat Bmet. She is in agreement with this.  She also stated that her zoloft was discontinued on discharge and was wondering if she could restart it. No need for correction based on renal function. Will restart zoloft 50mg  daily.   Patient had no further questions or concerns. She plans on keeping f/u appt with PCP in 1 month.   Alphonzo Grieve, MD IMTS - PGY2

## 2017-06-26 ENCOUNTER — Ambulatory Visit (INDEPENDENT_AMBULATORY_CARE_PROVIDER_SITE_OTHER): Payer: Medicare Other | Admitting: Podiatry

## 2017-06-26 ENCOUNTER — Encounter: Payer: Self-pay | Admitting: Internal Medicine

## 2017-06-26 ENCOUNTER — Ambulatory Visit: Payer: Medicare Other

## 2017-06-26 ENCOUNTER — Encounter: Payer: Self-pay | Admitting: Podiatry

## 2017-06-26 DIAGNOSIS — M79676 Pain in unspecified toe(s): Secondary | ICD-10-CM | POA: Diagnosis not present

## 2017-06-26 DIAGNOSIS — E1149 Type 2 diabetes mellitus with other diabetic neurological complication: Secondary | ICD-10-CM | POA: Diagnosis not present

## 2017-06-26 DIAGNOSIS — L84 Corns and callosities: Secondary | ICD-10-CM | POA: Diagnosis not present

## 2017-06-26 DIAGNOSIS — B351 Tinea unguium: Secondary | ICD-10-CM

## 2017-06-26 DIAGNOSIS — Q828 Other specified congenital malformations of skin: Secondary | ICD-10-CM

## 2017-06-26 DIAGNOSIS — M79675 Pain in left toe(s): Secondary | ICD-10-CM | POA: Diagnosis not present

## 2017-06-26 DIAGNOSIS — M2041 Other hammer toe(s) (acquired), right foot: Secondary | ICD-10-CM | POA: Diagnosis not present

## 2017-06-26 DIAGNOSIS — M79674 Pain in right toe(s): Secondary | ICD-10-CM

## 2017-06-26 DIAGNOSIS — I248 Other forms of acute ischemic heart disease: Secondary | ICD-10-CM

## 2017-06-26 NOTE — Progress Notes (Signed)
Subjective: 67 y.o. returns the office today for painful, elongated, thickened toenails which she cannot trim herself. Denies any redness or drainage around the nails.  She also states that the hammertoe of the right second toe has become very painful and she is starting to notice some redness to the top of the toe where it rubs inside of her shoes.  She has had a callus to the tip of the toe for quite some time.  She is concerned that if nothing is done that the toe is going to get infected and she is in end up with another amputation and she will to do something prophylactically to help prevent that from as well as to also decrease her pain.  Denies any acute changes since last appointment and no new complaints today. Denies any systemic complaints such as fevers, chills, nausea, vomiting.   PCP: Lars Mage, MD   Last A1c was 7.3  Objective: AAO 3, NAD DP/PT pulses palpable, CRT less than 3 seconds Nails hypertrophic, dystrophic, elongated, brittle, discolored 9. There is tenderness overlying the nails 1-5 bilaterally except for the right hallux which is been amputated. There is no surrounding erythema or drainage along the nail sites. Thick hyperkeratotic tissue present at the distal aspect the right second toe.  This area is pre-ulcerative but there is no underlying ulceration, drainage or any signs of infection.  There is also some slight erythema to the dorsal PIPJ from where the toe is contracted rubbing inside of her shoes.  Semirigid hammertoe contracture present the second digit.  Prior hallux amputation right side No open lesions or pre-ulcerative lesions are identified. No other areas of tenderness bilateral lower extremities. No overlying edema, erythema, increased warmth. No pain with calf compression, swelling, warmth, erythema.  Assessment: Patient presents with symptomatic onychomycosis; hammertoe deformity resulted in hyperkeratotic lesion  Plan: -Treatment options  including alternatives, risks, complications were discussed -Nails sharply debrided 9 without complication/bleeding. -Debrided the hyperkeratotic tissue to the right second toe without any complications or bleeding.  We had a long discussion regards to treatment options.  She is attempted offloading, shoe changes without any significant improvement in the pain is been worsening as well as the callus.  Because of this she would like to do surgery to help straighten the toe.  We discussed options and we decided to do a PIPJ arthroplasty of the right second toe.  We discussed the surgery as well as the postoperative course. -The incision placement as well as the postoperative course was discussed with the patient. I discussed risks of the surgery which include, but not limited to, infection, bleeding, pain, swelling, need for further surgery, delayed or nonhealing, painful or ugly scar, numbness or sensation changes, over/under correction, recurrence, transfer lesions, further deformity, DVT/PE, loss of toe/foot. Patient understands these risks and wishes to proceed with surgery. The surgical consent was reviewed with the patient all 3 pages were signed. No promises or guarantees were given to the outcome of the procedure. All questions were answered to the best of my ability. Before the surgery the patient was encouraged to call the office if there is any further questions. The surgery will be performed in the office under local anesthesia -Discussed daily foot inspection. If there are any changes, to call the office immediately.  -Follow-up next week in the office for surgery or sooner if any problems are to arise. In the meantime, encouraged to call the office with any questions, concerns, changes symptoms.  Celesta Gentile, DPM

## 2017-07-05 ENCOUNTER — Ambulatory Visit (INDEPENDENT_AMBULATORY_CARE_PROVIDER_SITE_OTHER): Payer: Medicare Other | Admitting: Podiatry

## 2017-07-05 ENCOUNTER — Encounter: Payer: Self-pay | Admitting: Podiatry

## 2017-07-05 ENCOUNTER — Ambulatory Visit (INDEPENDENT_AMBULATORY_CARE_PROVIDER_SITE_OTHER): Payer: Medicare Other

## 2017-07-05 DIAGNOSIS — M2041 Other hammer toe(s) (acquired), right foot: Secondary | ICD-10-CM

## 2017-07-05 DIAGNOSIS — R234 Changes in skin texture: Secondary | ICD-10-CM | POA: Diagnosis not present

## 2017-07-05 DIAGNOSIS — L84 Corns and callosities: Secondary | ICD-10-CM

## 2017-07-05 DIAGNOSIS — I248 Other forms of acute ischemic heart disease: Secondary | ICD-10-CM | POA: Diagnosis not present

## 2017-07-05 MED ORDER — HYDROCODONE-ACETAMINOPHEN 5-325 MG PO TABS: 1.0000 | ORAL_TABLET | Freq: Four times a day (QID) | ORAL | 0 refills | Status: DC | PRN

## 2017-07-05 MED ORDER — CEPHALEXIN 500 MG PO CAPS: 500.0000 mg | ORAL_CAPSULE | Freq: Three times a day (TID) | ORAL | 0 refills | Status: DC

## 2017-07-05 NOTE — Progress Notes (Signed)
Subjective: [name] presents the office today for surgical correction of her right second digit toe.  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  We have attempted long-term periodic trimmings and offloading without any improvement.  She will periodically get a wound to the tip of the toe.  Because of this she is up to try to do surgery to help prevent pressure to the tip of the toe to help prevent any further amputation.  We discussed with her arthroplasty of the PIPJ.  She wishes to proceed with surgery and she presents today for correction. Denies any systemic complaints such as fevers, chills, nausea, vomiting. No acute changes since last appointment, and no other complaints at this time.   Last A1c 7.3  Objective: AAO x3, NAD DP/PT pulses palpable bilaterally, CRT less than 3 seconds Semi-reducible hammertoe is present to the right second toe there is contracture of the PIPJ.  This is resulting in a thick hyperkeratotic lesion to the distal aspect of the toe.  Today there is no edema, erythema or any clinical signs of infection to the digit.  She does have a small area of tenderness submetatarsal 5 and upon palpation this is overlying a small skin fissure which is new but there is no drainage or pus or any swelling or open sores identified. No open lesions or pre-ulcerative lesions.  No pain with calf compression, swelling, warmth, erythema  Assessment: Hammertoe right second toe resulting in callus, pre-ulcerative area to the distal toe; skin fissure  Plan: -All treatment options discussed with the patient including all alternatives, risks, complications.  -We again discussed the surgery as well as the postoperative course.  The patient her daughter and her daughter's boyfriend were in the room.  Patient agrees to surgery and she has no further questions or concerns and  she wished to proceed today.  Of note the patient's daughter's boyfriend was recording me in the office without my knowledge and I did ask him to stop but he continued. -Surgical consent form was reviewed with the patient.  Right second toe PIPJ arthroplasty will be performed today. -The second toe was cleaned with alcohol.  3 cc of a mixture of 2% lidocaine plain and 0.5% Marcaine plain was infiltrated in a digital block fashion.  Once anesthetized the patient was brought back to the surgical suite.  A well-padded ankle tourniquet was applied.  The right lower extremity was then scrubbed, prepped, draped in normal sterile fashion.  The foot was exsanguinated and the tourniquet was inflated to 250 mmHg.  A linear incision was made along the dorsal PIPJ with a 15 blade scalpel to the epidermis and the dermis.  The subcutaneous tissues were then bluntly and sharply dissected making sure to retract all vital neurovascular structures.  The extensor tendon was then transected at the PIPJ as well as the collateral ligaments exposing the proximal phalanx head.  Sagittal bone saw was utilized to resect the proximal phalanx head.  The rough edges were smoothed with a rasp.  At this time there is found to be adequate reduction of the deformity of the toe was sitting in a more rectus position.  Incision was copiously irrigated with sterile saline mixed with gentamicin.  The extensor tendon was then reapproximated with Monocryl, 4-0.  The skin was then closed with nylon in a simple interrupted suture fashion.  Betadine was painted  over the incision followed by dry sterile dressing.  The tourniquet was released and there is found to be an immediate capillary refill time to all the digits.  She tolerated the procedure well without any complications. -Prior to the dressing antibiotic ointment was applied to the skin fissure. -She has a surgical shoe, to continue surgical shoe at all times, elevation.  Post procedure  instructions were discussed.  Should no further questions or concerns.  Prescribed Vicodin for pain as well as Keflex for prophylactic antibiotics. -Post op x-rays were obtained status post arthroplasty of the PIPJ without any evidence of complicating factors or fracture. -Follow-up on Monday for postop appointment or sooner if needed.  Call any questions or concerns.  Trula Slade DPM  -Patient encouraged to call the office with any questions, concerns, change in symptoms.

## 2017-07-06 ENCOUNTER — Telehealth: Payer: Self-pay | Admitting: *Deleted

## 2017-07-06 NOTE — Telephone Encounter (Signed)
Pt states she had in-office surgery yesterday and she feels it is bleeding more than it should. I asked pt when she noticed the blood. Pt states she noticed this morning and after a nap the sock came off and she saw it had been bleeding. I asked the pt if the area had bright red blood or if it was brown dried blood and she said it was dry. I told pt that was fine, the dried blood would serve as a splint, she could mark the area borders with a pin if it started bleeding again to call again. I told pt that she needed to stay off the foot as much as possible because the being up on the foot could cause more bleeding. Pt states understanding.

## 2017-07-07 ENCOUNTER — Telehealth: Payer: Self-pay | Admitting: Internal Medicine

## 2017-07-07 ENCOUNTER — Telehealth: Payer: Self-pay | Admitting: *Deleted

## 2017-07-07 DIAGNOSIS — E1142 Type 2 diabetes mellitus with diabetic polyneuropathy: Secondary | ICD-10-CM

## 2017-07-07 MED ORDER — GABAPENTIN 600 MG PO TABS
1200.0000 mg | ORAL_TABLET | Freq: Two times a day (BID) | ORAL | 0 refills | Status: DC
Start: 2017-07-07 — End: 2017-07-10

## 2017-07-07 NOTE — Telephone Encounter (Signed)
Called and spoke with the patient and the patient stated that the toe was bleeding and patient rewrapped the 2nd toe and used an ace bandage and it had stopped and patient stated that she would see [place] next week and there is no fever, chills, and stated to the patient to call if any concerns or questions. [name]

## 2017-07-07 NOTE — Telephone Encounter (Signed)
Patient needs a nurse to call her back, she called yesterday and no one called her back

## 2017-07-07 NOTE — Telephone Encounter (Signed)
Spoke with patient. Will do short refill of gabapentin 1200mg  TID and patient agrees to keep her 1/28 appt for repeat Bmet.  Discussed with Dr. Lynnae January.  Alphonzo Grieve, MD IMTS - PGY2

## 2017-07-07 NOTE — Telephone Encounter (Signed)
Do not have a message from 1/24, pt called her podiatry office 1/24 Pt states at her last appt she saw dr Jari Favre and she decreased pt's gabapentin dose, pt states she tried and began to go through withdrawal- N&V, jittery. Pt at that time went back to her old dose of 600mg , 2 tabs 3 times daily, she is now out of med and needs it refilled asap because she is starting to get sick. Please advise ASAP Sending to dr's chundi and svalina

## 2017-07-10 ENCOUNTER — Encounter: Payer: Self-pay | Admitting: Internal Medicine

## 2017-07-10 ENCOUNTER — Encounter: Payer: Medicare Other | Admitting: Podiatry

## 2017-07-10 ENCOUNTER — Other Ambulatory Visit: Payer: Self-pay

## 2017-07-10 ENCOUNTER — Ambulatory Visit (INDEPENDENT_AMBULATORY_CARE_PROVIDER_SITE_OTHER): Payer: Medicare Other | Admitting: Internal Medicine

## 2017-07-10 VITALS — BP 112/74 | HR 77 | Temp 98.4°F | Ht 67.0 in | Wt 189.9 lb

## 2017-07-10 DIAGNOSIS — F17211 Nicotine dependence, cigarettes, in remission: Secondary | ICD-10-CM | POA: Diagnosis not present

## 2017-07-10 DIAGNOSIS — I10 Essential (primary) hypertension: Secondary | ICD-10-CM | POA: Diagnosis not present

## 2017-07-10 DIAGNOSIS — J449 Chronic obstructive pulmonary disease, unspecified: Secondary | ICD-10-CM | POA: Diagnosis not present

## 2017-07-10 DIAGNOSIS — E118 Type 2 diabetes mellitus with unspecified complications: Secondary | ICD-10-CM

## 2017-07-10 DIAGNOSIS — E1142 Type 2 diabetes mellitus with diabetic polyneuropathy: Secondary | ICD-10-CM | POA: Diagnosis not present

## 2017-07-10 DIAGNOSIS — Z9889 Other specified postprocedural states: Secondary | ICD-10-CM

## 2017-07-10 DIAGNOSIS — E1110 Type 2 diabetes mellitus with ketoacidosis without coma: Secondary | ICD-10-CM

## 2017-07-10 DIAGNOSIS — E785 Hyperlipidemia, unspecified: Secondary | ICD-10-CM

## 2017-07-10 DIAGNOSIS — Z79899 Other long term (current) drug therapy: Secondary | ICD-10-CM

## 2017-07-10 DIAGNOSIS — N179 Acute kidney failure, unspecified: Secondary | ICD-10-CM

## 2017-07-10 DIAGNOSIS — Z7984 Long term (current) use of oral hypoglycemic drugs: Secondary | ICD-10-CM

## 2017-07-10 LAB — BASIC METABOLIC PANEL
Anion gap: 10 (ref 5–15)
BUN: 18 mg/dL (ref 6–20)
CALCIUM: 9.6 mg/dL (ref 8.9–10.3)
CO2: 20 mmol/L — ABNORMAL LOW (ref 22–32)
CREATININE: 1.36 mg/dL — AB (ref 0.44–1.00)
Chloride: 107 mmol/L (ref 101–111)
GFR, EST AFRICAN AMERICAN: 47 mL/min — AB (ref >60)
GFR, EST NON AFRICAN AMERICAN: 40 mL/min — AB (ref >60)
Glucose, Bld: 165 mg/dL — ABNORMAL HIGH (ref 65–99)
Potassium: 4.1 mmol/L (ref 3.5–5.1)
Sodium: 137 mmol/L (ref 135–145)

## 2017-07-10 MED ORDER — GABAPENTIN 600 MG PO TABS: 600.0000 mg | ORAL_TABLET | Freq: Three times a day (TID) | ORAL | 0 refills | Status: DC

## 2017-07-10 MED ORDER — GABAPENTIN 400 MG PO CAPS: 400.0000 mg | ORAL_CAPSULE | Freq: Three times a day (TID) | ORAL | 0 refills | Status: DC

## 2017-07-10 MED ORDER — AMLODIPINE BESYLATE 10 MG PO TABS: 10.0000 mg | ORAL_TABLET | Freq: Every day | ORAL | 0 refills | Status: DC

## 2017-07-10 NOTE — Assessment & Plan Note (Signed)
Bmet today shows stable Cr at 1.36 with GFR 40. This may indicate her sustained renal dysfunction as previous Cr b/l was 1.

## 2017-07-10 NOTE — Progress Notes (Signed)
   CC: AKI  HPI:  [name] is a 67 y.o. with a PMH of COPD, HTN, HLD, T2DM presenting to clinic for follow up on AKI, DM and HTN.  Patient seen 1/4 as hospital follow up for sepsis with AKI; at that time Bmet indicated some improvement in renal function and certain of her chronic medications could be restarted and dosages changed. One of the medicines was gabapentin which was decreased from 1200mg  TID to 600mg  BID based on renal dosing; patient states that soon after switching to lower dose, she began having "withdrawal" with nausea and vomiting, which resolved with going back up to her 1200mg  TID dose. She denies increased somnolence, dizziness, unsteady gait, confusion.   T2DM: Patient continues on glipizide 5mg  daily and sitagliptin 50mg  daily (reduced dose). Her metformin was held due to GFR <45. She reports higher CBGs at home but is unable to report readings. N/V promptly resolved with increase of gabapentin and had no further recurrence.  Please see problem based Assessment and Plan for status of patients chronic conditions.  Past Medical History:  Diagnosis Date  . Allergy   . Anemia   . Arthritis    "left knee" (09/08/2015)  . Asthma   . Brachial plexus disorders   . CHF (congestive heart failure) ([place])   . Chronic pain    neck pain, headache, neuropathy  . COPD (chronic obstructive pulmonary disease) ([place])    SEES ONLY DR. PATEL   . Depression    "when my husband passed in 2013"  . GERD (gastroesophageal reflux disease)   . Hypertension   . Hypertriglyceridemia   . Migraine    "monthly" (09/08/2015)  . Neuromuscular disorder (HCC)    neuropathy  . Puncture wound of foot, right 05/17/2012   Tetanus shot 3 yrs ago at [facility] in [place], per pt report   . Stroke ([place])    TIAS    IN [place]   4 YRS AGO   . Type II diabetes mellitus ([place]) 2010   diagnosed around 2010, only ever on metformin    Review of Systems:   ROS  Per HPI Physical Exam:  Vitals:    07/10/17 1440  BP: 112/74  Pulse: 77  Temp: 98.4 F (36.9 C)  TempSrc: Oral  SpO2: 100%  Weight: 189 lb 14.4 oz (86.1 kg)  Height: 5\' 7"  (1.702 m)   GENERAL- alert, co-operative, appears as stated age, not in any distress. HEENT- EOMI, oral mucosa appears moist CARDIAC- RRR, no murmurs, rubs or gallops. RESP- Moving equal volumes of air, and clear to auscultation bilaterally, no wheezes or crackles. ABDOMEN- Soft, nontender, bowel sounds present. NEURO- No obvious Cr N abnormality. EXTREMITIES- right foot in post-op shoe (hammer toe surgery). SKIN- Warm, dry. PSYCH- Normal mood and affect, appropriate thought content and speech.  Assessment & Plan:   See Encounters Tab for problem based charting.   Patient discussed with Dr. Moshe Cipro, MD Internal Medicine PGY2

## 2017-07-10 NOTE — Assessment & Plan Note (Signed)
Patient with withdrawal symptoms from change in gabapentin from 1200mg  TID to 600mg  BID based on renal dosing in setting of her AKI. No other source of N/V apparent.   Repeat Bmet today shows CrCl up to 57 which still falls under same dosing reduction recommendation (up to 700mg  BID).  After discussion with patient, she is in agreement with decreasing to 1000mg  TID dosing until f/u with PCP in 2 weeks. She understands 600mg  + 400mg  dosing requirements.  Plan: --total gabapentin 1000mg  TID (600mg  +400mg  tabs) #12 days to get to appt with PCP --at f/u can consider going down to 900mg  TID or 800mg  TID if able --advised to rtc or call if s/s of increased sedation, unsteady gait, dizziness

## 2017-07-10 NOTE — Patient Instructions (Signed)
We'll check your kidney function again today and I will call you with the results today and how we are going to adjust your medication.   I sent in the amlodipine and you should have another 3 month refill on your glipizide.

## 2017-07-10 NOTE — Assessment & Plan Note (Signed)
Bmet still indicates GFR 40 also mild acidosis at 20; at this level restarting metformin might be more harmful than beneficial.  Plan: --will hold on restarting metformin --continue glipizide 5mg  daily and sitagliptin 50mg  daily --f/u with PCP in 2 weeks; advised to check CBGs 1-2 times daily and bring to clinic; may benefit from additional therapy. At this time not candidate for SGLT2 inhibitors due to renal function; pioglitazone is an option but with her recent liver dysfunction may not be optimal.

## 2017-07-10 NOTE — Assessment & Plan Note (Signed)
Well controlled.  Plan: --continue amlodipine 10mg  daily and lisinopril 10mg  daily

## 2017-07-11 ENCOUNTER — Ambulatory Visit (INDEPENDENT_AMBULATORY_CARE_PROVIDER_SITE_OTHER): Payer: Medicare Other | Admitting: Podiatry

## 2017-07-11 ENCOUNTER — Encounter: Payer: Self-pay | Admitting: Podiatry

## 2017-07-11 ENCOUNTER — Ambulatory Visit (INDEPENDENT_AMBULATORY_CARE_PROVIDER_SITE_OTHER): Payer: Medicare Other

## 2017-07-11 DIAGNOSIS — M2041 Other hammer toe(s) (acquired), right foot: Secondary | ICD-10-CM

## 2017-07-11 MED ORDER — CEPHALEXIN 500 MG PO CAPS: 500.0000 mg | ORAL_CAPSULE | Freq: Two times a day (BID) | ORAL | 0 refills | Status: DC

## 2017-07-11 NOTE — Progress Notes (Signed)
Internal Medicine Clinic Attending  Case discussed with Dr. Huang  at the time of the visit.  We reviewed the resident's history and exam and pertinent patient test results.  I agree with the assessment, diagnosis, and plan of care documented in the resident's note.  Alexander N Raines, MD   

## 2017-07-12 ENCOUNTER — Other Ambulatory Visit: Payer: Self-pay

## 2017-07-12 ENCOUNTER — Ambulatory Visit (INDEPENDENT_AMBULATORY_CARE_PROVIDER_SITE_OTHER): Payer: Medicare Other | Admitting: Internal Medicine

## 2017-07-12 VITALS — BP 100/49 | HR 84 | Temp 98.9°F | Ht 67.0 in | Wt 187.8 lb

## 2017-07-12 DIAGNOSIS — F17211 Nicotine dependence, cigarettes, in remission: Secondary | ICD-10-CM

## 2017-07-12 DIAGNOSIS — Z7982 Long term (current) use of aspirin: Secondary | ICD-10-CM | POA: Diagnosis not present

## 2017-07-12 DIAGNOSIS — M6281 Muscle weakness (generalized): Secondary | ICD-10-CM | POA: Diagnosis not present

## 2017-07-12 DIAGNOSIS — G44209 Tension-type headache, unspecified, not intractable: Secondary | ICD-10-CM | POA: Diagnosis not present

## 2017-07-12 DIAGNOSIS — G44221 Chronic tension-type headache, intractable: Secondary | ICD-10-CM

## 2017-07-12 MED ORDER — NAPROXEN 500 MG PO TBEC: 500.0000 mg | DELAYED_RELEASE_TABLET | Freq: Two times a day (BID) | ORAL | 0 refills | Status: DC

## 2017-07-12 NOTE — Assessment & Plan Note (Signed)
Assessment: Tension headache Patient presents with several symptoms characterized as a tension headache. No alarming symptoms noted. Neuro exam without any cranial deficits. Will treat for 3 days with naproxen.  Plan -Naproxen

## 2017-07-12 NOTE — Progress Notes (Signed)
   CC: chronic headache and bilateral proximal muscle weakness  HPI:  [name] is a 67 y.o. female with history noted below that presents the acute care clinic for follow-up on chronic headache and bilateral proximal muscle weakness.  Patient states she's had a headache that starts in the back of her head and wraps around bilaterally to the front. Patient states she has a history of headaches and this feels just like her previous headaches. She states she has tried Imitrex and Excedrin for a couple days. She states she usually takes Excedrin for a longer period of time but states that she did not take Excedrin for several days. She states the pain is constant and is not influenced by light or noise. She states nothing makes the pain worse. She states that a good pillow makes the pain better. She denies nausea/vomiting, changes in vision, or tinnitus.  Patient reports having difficulty standing from a seated position. She states this happened in December she was in the hospital and improved until yesterday. She denies pain. She states that she drove herself today and was able to walk to her appointment. She denies unilateral weakness, numbness or tingling.  Past Medical History:  Diagnosis Date  . Allergy   . Anemia   . Arthritis    "left knee" (09/08/2015)  . Asthma   . Brachial plexus disorders   . CHF (congestive heart failure) ([place])   . Chronic pain    neck pain, headache, neuropathy  . COPD (chronic obstructive pulmonary disease) ([place])    SEES ONLY [name]   . Depression    "when my husband passed in 2013"  . GERD (gastroesophageal reflux disease)   . Hypertension   . Hypertriglyceridemia   . Migraine    "monthly" (09/08/2015)  . Neuromuscular disorder (HCC)    neuropathy  . Puncture wound of foot, right 05/17/2012   Tetanus shot 3 yrs ago at [facility] in [place], per pt report   . Stroke ([place])    TIAS    IN [place]   4 YRS AGO   . Type II diabetes mellitus  ([place]) 2010   diagnosed around 2010, only ever on metformin    Review of Systems:  As noted above in history of present illness  Physical Exam:  Vitals:   07/12/17 1333  BP: (!) 100/49  Pulse: 84  Temp: 98.9 F (37.2 C)  TempSrc: Oral  SpO2: 93%  Weight: 187 lb 12.8 oz (85.2 kg)  Height: 5\' 7"  (1.702 m)   Physical Exam  Constitutional: She is well-developed, well-nourished, and in no distress.  Cardiovascular: Normal rate, regular rhythm and normal heart sounds. Exam reveals no gallop and no friction rub.  No murmur heard. Pulmonary/Chest: Effort normal and breath sounds normal. No respiratory distress. She has no wheezes. She has no rales.  Musculoskeletal: She exhibits no edema.  5/5 motor strength in upper and lower extremities  Difficulty getting up from seated position.  Had to use arms to push off chair after rocking   Neurological: No cranial nerve deficit. Gait normal. Coordination normal.     Assessment & Plan:   See encounters tab for problem based medical decision making.    Patient discussed with [name]

## 2017-07-12 NOTE — Progress Notes (Signed)
Subjective: [name] is a 67 y.o. is seen today in office s/p right 2nd digit hammertoe repair preformed on 07/05/2017.  He states that she is doing well she is only had to take 3 or 4 pain pills.  The pain should come off so she does change the bandage on her own.  She is also been taking antibiotics.  She is remained in the surgical shoe the majority of the time.  She has no new concerns.  Denies any systemic complaints such as fevers, chills, nausea, vomiting. No calf pain, chest pain, shortness of breath.   Objective: General: No acute distress, AAOx3  DP/PT pulses palpable 2/4, CRT < 3 sec to all digits.  Protective sensation intact. Motor function intact.  RIGHT foot: Incision is well coapted without any evidence of dehiscence and sutures are intact.  There is faint surrounding erythema to the toe but there is no ascending cellulitis and there is no increase in warmth.  There is no drainage or pus coming from the incision.  No fluctuation or crepitation.  There is no malodor.  There is mild edema around the surgical site. There is no pain along the surgical site.  Keratotic lesion to the distal portion of the toe.  Upon debridement there is no underlying ulceration, drainage or any clinical signs of infection. No other areas of tenderness to bilateral lower extremities.  No other open lesions or pre-ulcerative lesions.  No pain with calf compression, swelling, warmth, erythema.   Assessment and Plan:  Status post right foot surgery  -Treatment options discussed including all alternatives, risks, and complications -Debrided the hyperkeratotic tissue without any complications or bleeding. -Incision appears to be healing well.  Antibiotic ointment and a bandage was applied.  She can do this at home as well.  She has been doing this anyway. -Continue antibiotics.  Refill today. -The remaining surgical shoe. -Elevation -Pain medication as needed. -Monitor for any clinical signs or  symptoms of infection and DVT/PE and directed to call the office immediately should any occur or go to the ER. -Follow-up as scheduled or sooner if any problems arise. In the meantime, encouraged to call the office with any questions, concerns, change in symptoms.   Celesta Gentile, DPM

## 2017-07-12 NOTE — Patient Instructions (Signed)
Ms. Convey,  Was a pleasure seeing you today. Please take naproxen twice a day for the next 3 days for your headache.

## 2017-07-12 NOTE — Assessment & Plan Note (Signed)
Assessment: Proximal muscle weakness Patient had a normal gait however had difficulty standing from a seated position. She denies any pain. Will check a CK and ESR assessing for myopathy. If normal will refer to PT for strength training.  Plan -CK and ESR

## 2017-07-13 LAB — CK: Total CK: 519 U/L (ref 24–173)

## 2017-07-13 LAB — SEDIMENTATION RATE: Sed Rate: 21 mm/hr (ref 0–40)

## 2017-07-13 NOTE — Progress Notes (Signed)
Internal Medicine Clinic Attending  Case discussed with Dr. Hoffman at the time of the visit.  We reviewed the resident's history and exam and pertinent patient test results.  I agree with the assessment, diagnosis, and plan of care documented in the resident's note.  

## 2017-07-14 ENCOUNTER — Other Ambulatory Visit: Payer: Self-pay | Admitting: Internal Medicine

## 2017-07-14 DIAGNOSIS — M6281 Muscle weakness (generalized): Secondary | ICD-10-CM

## 2017-07-18 ENCOUNTER — Ambulatory Visit (INDEPENDENT_AMBULATORY_CARE_PROVIDER_SITE_OTHER): Payer: Medicare Other | Admitting: Podiatry

## 2017-07-18 DIAGNOSIS — M2041 Other hammer toe(s) (acquired), right foot: Secondary | ICD-10-CM

## 2017-07-19 NOTE — Progress Notes (Signed)
Subjective: [name] is a 67 y.o. is seen today in office s/p right 2nd digit hammertoe repair preformed on 07/05/2017.  She states that she is doing well and she is been on her feet quite a bit.  She  has been taking antibiotics as directed.  She is remained in the surgical shoe.  She does continue to change the bandage daily with antibiotic ointment to the incision.  She denies any recent injury or trauma.  She has no other concerns today. Denies any systemic complaints such as fevers, chills, nausea, vomiting. No calf pain, chest pain, shortness of breath.   Objective: General: No acute distress, AAOx3  DP/PT pulses palpable 2/4, CRT < 3 sec to all digits.  Protective sensation intact. Motor function intact.  RIGHT foot: Incision is well coapted without any evidence of dehiscence and sutures are intact.  Mild motion across the incision but overall appears to be healing well.  Mild edema to the toe but this is improved and there is no erythema which is also resolved.  There is no drainage or pus or ascending cellulitis.  There is no clinical signs of infection noted.  No tenderness to the toe. Hyperkeratotic lesion to the distal portion of the second toe is much improved.  I did debride it lightly today and there is no underlying ulceration drainage or any signs of infection.   Small abrasion plantar third digit along the sulcus but no probing or surrounding erythema or signs of infection. No other areas of tenderness to bilateral lower extremities.  No other open lesions or pre-ulcerative lesions.  No pain with calf compression, swelling, warmth, erythema.   Assessment and Plan:  Status post right foot surgery  -Treatment options discussed including all alternatives, risks, and complications -I left the sutures intact today to ensure healing.  Antibiotic ointment was applied followed by a bandage.  She can continue daily dressing changes as she has been doing this anyway.  Also antibiotic  ointment to the small abrasion on the bottom of the third toe near the MPJ.  Finished course of antibiotics.  Main surgical shoe.  She states that she recently just got a dog and she could go home and play with it.  I discussed with her again today that she is to return to the operative foot to ensure healing. -Monitor for any clinical signs or symptoms of infection and directed to call the office immediately should any occur or go to the ER. -Follow-up in 1 week for suture removal or sooner if needed.  Call any questions or concerns.  She agrees with this plan has no further questions today.  Celesta Gentile, DPM

## 2017-07-21 ENCOUNTER — Encounter: Payer: Self-pay | Admitting: Internal Medicine

## 2017-07-21 ENCOUNTER — Ambulatory Visit (INDEPENDENT_AMBULATORY_CARE_PROVIDER_SITE_OTHER): Payer: Medicare Other | Admitting: Internal Medicine

## 2017-07-21 ENCOUNTER — Other Ambulatory Visit: Payer: Self-pay

## 2017-07-21 VITALS — BP 114/60 | HR 70 | Temp 97.8°F | Wt 183.9 lb

## 2017-07-21 DIAGNOSIS — E669 Obesity, unspecified: Secondary | ICD-10-CM

## 2017-07-21 DIAGNOSIS — I248 Other forms of acute ischemic heart disease: Secondary | ICD-10-CM | POA: Diagnosis not present

## 2017-07-21 DIAGNOSIS — F322 Major depressive disorder, single episode, severe without psychotic features: Secondary | ICD-10-CM

## 2017-07-21 DIAGNOSIS — I10 Essential (primary) hypertension: Secondary | ICD-10-CM | POA: Diagnosis not present

## 2017-07-21 DIAGNOSIS — F329 Major depressive disorder, single episode, unspecified: Secondary | ICD-10-CM | POA: Insufficient documentation

## 2017-07-21 DIAGNOSIS — E1142 Type 2 diabetes mellitus with diabetic polyneuropathy: Secondary | ICD-10-CM

## 2017-07-21 DIAGNOSIS — E118 Type 2 diabetes mellitus with unspecified complications: Secondary | ICD-10-CM

## 2017-07-21 DIAGNOSIS — Z0000 Encounter for general adult medical examination without abnormal findings: Secondary | ICD-10-CM

## 2017-07-21 LAB — GLUCOSE, CAPILLARY: Glucose-Capillary: 184 mg/dL — ABNORMAL HIGH (ref 65–99)

## 2017-07-21 LAB — POCT GLYCOSYLATED HEMOGLOBIN (HGB A1C): Hemoglobin A1C: 7.8

## 2017-07-21 MED ORDER — AMLODIPINE BESYLATE 10 MG PO TABS: 10.0000 mg | ORAL_TABLET | Freq: Every day | ORAL | 0 refills | Status: DC

## 2017-07-21 MED ORDER — SERTRALINE HCL 50 MG PO TABS: 100.0000 mg | ORAL_TABLET | Freq: Every day | ORAL | 0 refills | Status: DC

## 2017-07-21 MED ORDER — GABAPENTIN 600 MG PO TABS: 600.0000 mg | ORAL_TABLET | Freq: Every day | ORAL | 0 refills | Status: DC

## 2017-07-21 MED ORDER — GABAPENTIN 400 MG PO CAPS: 400.0000 mg | ORAL_CAPSULE | Freq: Every day | ORAL | 0 refills | Status: DC

## 2017-07-21 MED ORDER — SITAGLIPTIN PHOS-METFORMIN HCL 50-1000 MG PO TABS: 1.0000 | ORAL_TABLET | Freq: Every day | ORAL | 0 refills | Status: DC

## 2017-07-21 NOTE — Patient Instructions (Addendum)
It was a pleasure to see you today [name]. Please make the following changes:  -Stop [place] -Start taking janumet 50-1000mg  daily -Please take increased dose of sertraline 100mg  daily -Please go to counseling -Please go to ophthalmology appointment -Please continue taking all medications  -Please decrease your gabapentin dosage as below-\  Take 1000mg  (600mg , 400mg ) three times daily for 4 days  Take 600mg  two times daily and then 1000mg  (600mg  and  400mg ) for 4 days   Take 600mg  three times a day  Take 400mg  three times a day  If you have any questions or concerns, please call our clinic at [phone] between 9am-5pm and after hours call [phone] and ask for the internal medicine resident on call. If you feel you are having a medical emergency please call 911.   Thank you, we look forward to help you remain healthy!  Lars Mage, MD Internal Medicine PGY1

## 2017-07-21 NOTE — Progress Notes (Signed)
   CC: Diabetes follow up  HPI:  [name] is a 67 y.o. with hypertension, diabetes mellitus type 2, history of tia who presents for diabetes follow up. Please see problem based charting for evaluation, assessment, and plan.  Past Medical History:  Diagnosis Date  . Allergy   . Anemia   . Arthritis    "left knee" (09/08/2015)  . Asthma   . Brachial plexus disorders   . CHF (congestive heart failure) ([place])   . Chronic pain    neck pain, headache, neuropathy  . COPD (chronic obstructive pulmonary disease) ([place])    SEES ONLY DR. PATEL   . Depression    "when my husband passed in 2013"  . GERD (gastroesophageal reflux disease)   . Hypertension   . Hypertriglyceridemia   . Migraine    "monthly" (09/08/2015)  . Neuromuscular disorder (HCC)    neuropathy  . Puncture wound of foot, right 05/17/2012   Tetanus shot 3 yrs ago at [facility] in [place], per pt report   . Stroke ([place])    TIAS    IN [place]   4 YRS AGO   . Type II diabetes mellitus ([place]) 2010   diagnosed around 2010, only ever on metformin   Review of Systems:    Decreased appetite, depressed mood, worthlessness Numbness and tingling in extremities  Physical Exam:  Vitals:   07/21/17 1516  BP: 114/60  Pulse: 70  Temp: 97.8 F (36.6 C)  TempSrc: Oral  SpO2: 99%  Weight: 183 lb 14.4 oz (83.4 kg)   Physical Exam  Constitutional: She appears well-developed and well-nourished. No distress.  HENT:  Head: Normocephalic and atraumatic.  Eyes: Conjunctivae are normal.  Cardiovascular: Normal rate, regular rhythm and normal heart sounds.  Respiratory: Effort normal and breath sounds normal. No respiratory distress. She has no wheezes.  GI: Soft. Bowel sounds are normal. She exhibits no distension. There is no tenderness.  Musculoskeletal: She exhibits no edema.  Neurological: She is alert.  Skin: She is not diaphoretic. No erythema.  Psychiatric: Her speech is normal. Judgment and thought content  normal. She is slowed. Cognition and memory are normal. She exhibits a depressed mood.    Assessment & Plan:   See Encounters Tab for problem based charting.  Patient discussed with Dr. Lynnae January

## 2017-07-22 NOTE — Assessment & Plan Note (Signed)
Ophthalmology referral made Influenza vaccine already received

## 2017-07-22 NOTE — Assessment & Plan Note (Signed)
Assessment: The patient's last a1c was checked in November 2018 during which time it was 7.3. The patient states that she has been compliant with her medication. She has not been recording her blood glucose measurements at home. The patient's a1c during this visit was 7.8 and random glucose was 184. The patient is taking [place] 50mg  daily, glipizide 5mg  qhs. She was taken off of metformin which she was previously on due to acute kidney injury.   Since the patient's a1c has increased and is not at goal. Will restart the patient on metformin as her renal function seems to have returned back to normal based on her last bmp done 07/10/17 ( cr=1.36, gfr=40).   Plan -Stopped patient's [place] 50mg  qd -Started the patient on janumet 50-1000mg  qd -Requested the patient to record blood glucose measurements and return in 1 month

## 2017-07-22 NOTE — Assessment & Plan Note (Signed)
Assessment The patient is currently taking 3000mg  of gabapentin per day. The patient has poor renal function and should be on a lower dose of gabapentin. Previously when the gabapentin was titrated down too quickly during hospitalization the patient developed gabapentin withdrawal.   Plan Will titrate gabapentin dose slowly Gabapentin 1000mg  (600mg , 400mg ) three times daily for 4 days Gabapentin 600mg  two times daily and then 1000mg  (600mg  and 400mg ) for 4 days  Gabapentin 600mg  three times a day for four days Gabapentin 400mg  three times a day thereafter

## 2017-07-22 NOTE — Assessment & Plan Note (Signed)
Assessment The patient's blood pressure during this visit was 114/60 on amlodipine 10mg  qd and lisinopril 10mg  qd.   Will continue same blood pressure regimen as the bp is well controlled  Plan -continue amlodipine 10mg  qd -lisinopril 10mg  qd

## 2017-07-22 NOTE — Assessment & Plan Note (Signed)
Assessment The patient states that she has been feeling depressed lately. She states that she has been having decreased appetite, sleeping less, is more irritable, feeling irritable, having decreased energy, and she states that she has been crying often. The patient denies any suicidal or homicidal ideation. She was recently hospitalized from 06/05/18-06/10/17 with sepsis from unknown source. She states that her mood has been worsening since this time.    The patient meets criteria for major depressive disorder. She is already on sertraline 50mg  qd.   Plan -Will increase the patient's sertraline from 50mg  to 100mg  qd -Referred to social work to setup counseling  -Follow up in 1 month to follow up regarding mood

## 2017-07-24 NOTE — Progress Notes (Signed)
Internal Medicine Clinic Attending  Case discussed with Dr. Chundi at the time of the visit.  We reviewed the resident's history and exam and pertinent patient test results.  I agree with the assessment, diagnosis, and plan of care documented in the resident's note. 

## 2017-07-25 ENCOUNTER — Ambulatory Visit (INDEPENDENT_AMBULATORY_CARE_PROVIDER_SITE_OTHER): Payer: Medicare Other

## 2017-07-25 DIAGNOSIS — M2041 Other hammer toe(s) (acquired), right foot: Secondary | ICD-10-CM

## 2017-07-25 NOTE — Progress Notes (Signed)
Patient presents s/p 2nd digit hammertoe repair on 07/05/17. She states that she is not having any pain at this time but she admitted to not wearing her surgical shoe all the time.   Sutures removed, wound edges aligned and coapted, no drainage, no redness. Mild swelling WNL. Re-wrapped her toe and advised her to stay in her surgical shoe. Follow up in 2 weeks

## 2017-08-08 ENCOUNTER — Ambulatory Visit (INDEPENDENT_AMBULATORY_CARE_PROVIDER_SITE_OTHER): Payer: Medicare Other | Admitting: Podiatry

## 2017-08-08 DIAGNOSIS — E1149 Type 2 diabetes mellitus with other diabetic neurological complication: Secondary | ICD-10-CM | POA: Diagnosis not present

## 2017-08-08 DIAGNOSIS — L84 Corns and callosities: Secondary | ICD-10-CM | POA: Diagnosis not present

## 2017-08-08 DIAGNOSIS — M2041 Other hammer toe(s) (acquired), right foot: Secondary | ICD-10-CM

## 2017-08-09 NOTE — Progress Notes (Signed)
Subjective: [name] is a 67 y.o. is seen today in office s/p right 2nd digit hammertoe repair preformed on 07/05/2017.  She states the second toe is doing well the callus to the distal portion of the toe is getting much better.  She still has some swelling to the toe but she has no pain.  The only areas that she has to her right foot today is the third toe as well as submetatarsal 5 as there is hyperkeratotic lesions to those areas.  Denies any open sores or redness or drainage or any swelling.  She has been in a regular shoe with alcohol postoperative process and she has been very active on her feet with her grandchild as well as a new puppy.  She has no other concerns today. Denies any systemic complaints such as fevers, chills, nausea, vomiting. No calf pain, chest pain, shortness of breath.   Objective: General: No acute distress, AAOx3  DP/PT pulses palpable 2/4, CRT < 3 sec to all digits.  Protective sensation intact. Motor function intact.  RIGHT foot: Incision is well coapted without any evidence of dehiscence and the scar is well formed.  There is no tenderness palpation of the second toe.  There is mild swelling but there is no erythema or increase in warmth.  Hyperkeratotic tissue to the distal aspect of the toe however upon debridement there is no underlying ulceration, drainage or any signs of infection noted.  Hyperkeratotic lesions present to the distal lateral portion of the third digit as well as submetatarsal 5.  Upon debridement there is no underlying ulceration, drainage or any signs of infection noted.  There is no other open lesions or pre-ulcerative lesions identified today.  No pain with calf compression, swelling, warmth, erythema.   Assessment and Plan:  Status post right foot surgery; pre-ulcerative calluses right foot x2  -Treatment options discussed including all alternatives, risks, and complications -Sharply debrided the hyperkeratotic lesions to the right foot x2  without any complications or bleeding.  I did further modify her diabetic inserts to help take pressure off the areas mostly submetatarsal 5.  Continue to monitor daily for any skin breakdown.  Monitor for signs or symptoms of infection. -The right second toe is doing well.  Also debrided the hyperkeratotic lesion to the distal portion of the toe without any complications or bleeding.  The surgical site appears to be doing well.  We discharged her to the postoperative care and she is wearing a regular shoe with no pain.  I will see her back as scheduled for routine care or sooner if any issues are to arise.  She agrees with this plan has no further questions.  Trula Slade DPM

## 2017-08-10 ENCOUNTER — Other Ambulatory Visit: Payer: Self-pay

## 2017-08-10 DIAGNOSIS — E1142 Type 2 diabetes mellitus with diabetic polyneuropathy: Secondary | ICD-10-CM

## 2017-08-10 NOTE — Telephone Encounter (Signed)
gabapentin (NEURONTIN) 600 MG tablet, refill request @ [facility] on [address].

## 2017-08-11 ENCOUNTER — Other Ambulatory Visit: Payer: Self-pay | Admitting: Internal Medicine

## 2017-08-11 DIAGNOSIS — E1142 Type 2 diabetes mellitus with diabetic polyneuropathy: Secondary | ICD-10-CM

## 2017-08-11 MED ORDER — GABAPENTIN 400 MG PO CAPS: 400.0000 mg | ORAL_CAPSULE | Freq: Every day | ORAL | 0 refills | Status: DC

## 2017-08-11 NOTE — Telephone Encounter (Signed)
Patient is calling back about the gabapentin

## 2017-08-11 NOTE — Addendum Note (Signed)
Addended by: Hulan Fray on: 08/11/2017 06:13 PM   Modules accepted: Orders

## 2017-08-12 ENCOUNTER — Encounter: Payer: Self-pay | Admitting: Internal Medicine

## 2017-08-12 MED ORDER — GABAPENTIN 400 MG PO CAPS: 400.0000 mg | ORAL_CAPSULE | Freq: Three times a day (TID) | ORAL | 0 refills | Status: DC

## 2017-08-12 NOTE — Telephone Encounter (Signed)
Patient called about gabapentin refill which has been pending. She states she is now out. From note review patient and Dr. Maricela Bo had worked out a taper at last visit from 1000mg  TID to 400mg  TID which patient should be at right now. Patient confirmed 400mg  TID dosing is what she should be on.   Refill seems appropriate based on counts from previous script.  D/C gabapentin 600mg  script and refill request. Sent in 400mg  TID #90 to get to appt with Dr. Maricela Bo on 3/29  Patient in agreement and confirms appt.  Alphonzo Grieve, MD IMTS - PGY2 Pager [phone]

## 2017-08-12 NOTE — Progress Notes (Signed)
[name] called in for a refill. At previous appointment we discussed a medication taper of her gabapentin as below:  Gabapentin 1000mg  (600mg , 400mg ) tid for 4 days Gabapentin 600mg  bid and 1000mg  for 4 days  Gabapentin 600mg  three times a day for 4 days and then  Gabapentin 400mg  three times a day   The patient stated that she is now on Gabapentin 400mg  tid. [name] Gabapentin was refilled for 1 month and next steps will be discussed at appointment on 09/08/17.

## 2017-08-14 ENCOUNTER — Telehealth: Payer: Self-pay

## 2017-08-14 NOTE — Telephone Encounter (Signed)
Patient states on 08/11/17 she was given rx for 30 tabs gaba 400 mg take 1 cap daily. She is concerned that it is not going to work bec she takes it 3 times daily. Informed that on 08/12/17 , new rx was sent to [facility] for gaba 400 mg, 90 capsules to be taken 1 cap 3 times daily. She was not aware of this. Verbalized understanding & appreciative of the info.

## 2017-08-14 NOTE — Telephone Encounter (Signed)
Per patient gabapentin (NEURONTIN) 400 MG capsule is not working. Requesting to speak with a nurse. Please call pt back.

## 2017-08-16 NOTE — Telephone Encounter (Signed)
Patient made aware that gabapentin sent to [facility] on [address] on 08/12/2017. Patient very appreciative. Hubbard Hartshorn, RN, BSN

## 2017-08-28 ENCOUNTER — Other Ambulatory Visit: Payer: Self-pay | Admitting: Internal Medicine

## 2017-08-28 DIAGNOSIS — F322 Major depressive disorder, single episode, severe without psychotic features: Secondary | ICD-10-CM

## 2017-08-28 MED ORDER — SERTRALINE HCL 100 MG PO TABS: 100.0000 mg | ORAL_TABLET | Freq: Every day | ORAL | 0 refills | Status: DC

## 2017-08-28 NOTE — Telephone Encounter (Signed)
Patient is requesting a refills Zoloft 50mg  tablet, [facility] [address] previous [facility]

## 2017-08-28 NOTE — Telephone Encounter (Signed)
Please note changed the strength of the tablet from 50mg  to 100mg  to decrease pill burden

## 2017-08-30 ENCOUNTER — Other Ambulatory Visit: Payer: Self-pay

## 2017-08-30 DIAGNOSIS — E118 Type 2 diabetes mellitus with unspecified complications: Secondary | ICD-10-CM

## 2017-08-30 MED ORDER — GLUCOSE BLOOD VI STRP: ORAL_STRIP | 12 refills | Status: DC

## 2017-08-30 NOTE — Telephone Encounter (Signed)
Requesting test strips to be filled @ [facility] on [address].

## 2017-09-07 ENCOUNTER — Other Ambulatory Visit: Payer: Self-pay | Admitting: Internal Medicine

## 2017-09-07 DIAGNOSIS — I10 Essential (primary) hypertension: Secondary | ICD-10-CM

## 2017-09-07 NOTE — Telephone Encounter (Signed)
Will review and refill at tomorrow's appt after bp check. Thank you!

## 2017-09-07 NOTE — Telephone Encounter (Signed)
Refill request denied, pcp will review at upcoming appt scheduled for tomorrow.Regenia Skeeter, Darlene Cassady3/28/201910:09 AM

## 2017-09-07 NOTE — Progress Notes (Addendum)
CC: Right foot pain  HPI:  [name] is a 67 y.o. female with hypertension, diabetes mellitus type 2, primary oseoarthritis of left knee, obesity, mild depression, and history of tia who presented for right foot pain. Please see problem based charting for evaluation, assessment, and plan.  Past Medical History:  Diagnosis Date  . Allergy   . Anemia   . Arthritis    "left knee" (09/08/2015)  . Asthma   . Brachial plexus disorders   . CHF (congestive heart failure) ([place])   . Chronic pain    neck pain, headache, neuropathy  . COPD (chronic obstructive pulmonary disease) ([place])    SEES ONLY DR. PATEL   . Depression    "when my husband passed in 2013"  . GERD (gastroesophageal reflux disease)   . Hypertension   . Hypertriglyceridemia   . Migraine    "monthly" (09/08/2015)  . Neuromuscular disorder (H[facility])    neuropathy  . Puncture wound of foot, right 05/17/2012   Tetanus shot 3 yrs ago at [facility] in [place], per pt report   . Stroke ([place])    [facility]    IN [place]   4 YRS AGO   . Type II diabetes mellitus ([place]) 2010   diagnosed around 2010, only ever on metformin   Review of Systems:   Endorses headache, forgetful Denies abd pain, chest pain, sob, nausea/vomiting  Physical Exam:  Vitals:   09/08/17 1556  BP: 101/80  Pulse: 87  Temp: 98 F (36.7 C)  TempSrc: Oral  SpO2: 100%  Weight: 186 lb 8 oz (84.6 kg)  Height: 5\' 7"  (1.702 m)   Physical Exam  Constitutional: She appears well-developed and well-nourished. No distress.  HENT:  Head: Normocephalic and atraumatic.  Eyes: Conjunctivae are normal.  Cardiovascular: Normal rate, regular rhythm and normal heart sounds.  Respiratory: Effort normal. No respiratory distress. She has wheezes (diffuse wheezing).  GI: Soft. Bowel sounds are normal. She exhibits no distension. There is no tenderness.  Musculoskeletal: She exhibits no edema.  Neurological: She is alert.  Left leg has diminished sensation, right  leg has absent sensation up till mid calf.  Skin: She is not diaphoretic. No erythema.  Psychiatric: She has a normal mood and affect. Her behavior is normal. Judgment and thought content normal.    Assessment & Plan:   See Encounters Tab for problem based charting.   Hypertension The patient presented with a blood pressure of 101/80. She is currently taking amlodipine 10mg  qd, lisinopril 10mg  qd.   Assessment The patient's blood pressure is soft and therefore would like to cut back on her medication. Her BP goal should be <130/80 based on JNC 8 due to Diabetes mellitus comorbidity.   Plan -Decreased from amlodipine 10mg  to 5 mg  -Continue lisinopril 5mg  qd  Diabetes Mellitus The patient's last a1c=7.8 on 07/21/17. The patient mentioned that she has not been able to check her blood glucose due to her running out of blood glucose strips. The patient does not note any symptomatic episodes of hypoglycemia. She gained 3 lbs since last appt in February 2019.   The patient is currently taking glipizide 5mg  qd and janumet 50-1000mg  qd. She states that she has been compliant with medication.   Assessment The patient is compliant with her diabetes medicaition  Plan  The patient's A1C will be checked in 2 months  -continue glipizide 5mg  qd, janumet 50-1000mg  qd  Diabetic peripheral neuropathy: The patient was placed on a gabapentin taper due  to poor renal function. She should currently be on gabapentin 400mg  tid. However, the patient states that she has a lot of pain and therefore has not been able to taper. She is currently on gabapentin 800mg  bid and 1200 qhs by using gabapentin she has left over from previous scripts.   Plan Recommended the patient to retry the gabapentin taper with mobic added for pain control  Allergic Rhinitis The patient mentioned that she has been having runny/stuffy nose, tearing in bilateral eyes, cough, postnasal drip. She states that she has been feeling this  way every year.   Assessment The patient likely has allergic rhinitis   Plan -Prescribed loratadine 10mg  qd  Major Depressive Disorder The patient's PHQ9=8 which is unchanged from previous visit. The patient is currently taking zoloft 100mg  qd. She is tolerating the medicine well.   Plan -continue zoloft 100mg  qd  Right foot pain The patient mentioned that she has been having pain on the medial and lateral aspect of the right foot. She mentions that the pain is worse at nighttime. She has been using gabapentin and ibuprofen for pain.   At last appointment the patient was placed on a gabapentin taper over the past month. The patient should currently be on gabapentin 400mg  tid. However, the patient states that she has been in extreme pain and has used extra gabapentin that she has left. She has been taking gabapentin 800mg  bid and 1200mg  qhs to total 2800mg  of gabapentin per day.   The patient is being followed by podiatry and she states that the insoles that they provided have not been helping her.   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 The patient has not developed good ergonomics to walk on right foot which is complicated by lower extremity diabetic neuropathy and osteoarthritic changes to right foot. Patient's abi's were assessed to note whether there is any vascular changes. ABI's were normal-left 1.15 and right 1.16.  Plan Recommended that the patient follow with orthopedics for further recommendations Recommended the patient try to taper gabapentin again Prescribed Mobic 7.5mg  qd ABI's normal  Health Maintenance -Sent ophthalmology referral -Refused Influenza vaccine  -Sent order to schedule mammogram  Patient discussed with Dr. Beryle Beams  Medicine attending: Medical history, presenting problems, physical findings, and medications, reviewed with resident physician Dr Lars Mage on the day of the patient  visit and I concur with her evaluation and management plan.

## 2017-09-08 ENCOUNTER — Encounter: Payer: Self-pay | Admitting: Internal Medicine

## 2017-09-08 ENCOUNTER — Other Ambulatory Visit: Payer: Self-pay

## 2017-09-08 ENCOUNTER — Ambulatory Visit (INDEPENDENT_AMBULATORY_CARE_PROVIDER_SITE_OTHER): Payer: Medicare Other | Admitting: Internal Medicine

## 2017-09-08 VITALS — BP 101/80 | HR 87 | Temp 98.0°F | Ht 67.0 in | Wt 186.5 lb

## 2017-09-08 DIAGNOSIS — Z1231 Encounter for screening mammogram for malignant neoplasm of breast: Secondary | ICD-10-CM | POA: Diagnosis not present

## 2017-09-08 DIAGNOSIS — E785 Hyperlipidemia, unspecified: Secondary | ICD-10-CM | POA: Diagnosis not present

## 2017-09-08 DIAGNOSIS — M79671 Pain in right foot: Secondary | ICD-10-CM

## 2017-09-08 DIAGNOSIS — I10 Essential (primary) hypertension: Secondary | ICD-10-CM

## 2017-09-08 DIAGNOSIS — E1169 Type 2 diabetes mellitus with other specified complication: Secondary | ICD-10-CM

## 2017-09-08 DIAGNOSIS — E118 Type 2 diabetes mellitus with unspecified complications: Secondary | ICD-10-CM | POA: Diagnosis not present

## 2017-09-08 DIAGNOSIS — E1142 Type 2 diabetes mellitus with diabetic polyneuropathy: Secondary | ICD-10-CM | POA: Diagnosis not present

## 2017-09-08 DIAGNOSIS — I248 Other forms of acute ischemic heart disease: Secondary | ICD-10-CM | POA: Diagnosis not present

## 2017-09-08 DIAGNOSIS — F322 Major depressive disorder, single episode, severe without psychotic features: Secondary | ICD-10-CM | POA: Diagnosis not present

## 2017-09-08 DIAGNOSIS — J302 Other seasonal allergic rhinitis: Secondary | ICD-10-CM | POA: Diagnosis not present

## 2017-09-08 DIAGNOSIS — Z1239 Encounter for other screening for malignant neoplasm of breast: Secondary | ICD-10-CM

## 2017-09-08 DIAGNOSIS — Z0000 Encounter for general adult medical examination without abnormal findings: Secondary | ICD-10-CM | POA: Diagnosis not present

## 2017-09-08 MED ORDER — MELOXICAM 7.5 MG PO TABS: 7.5000 mg | ORAL_TABLET | Freq: Every day | ORAL | 0 refills | Status: DC

## 2017-09-08 MED ORDER — AMLODIPINE BESYLATE 5 MG PO TABS: 5.0000 mg | ORAL_TABLET | Freq: Every day | ORAL | 0 refills | Status: DC

## 2017-09-08 MED ORDER — GLUCOSE BLOOD VI STRP: ORAL_STRIP | 3 refills | Status: DC

## 2017-09-08 MED ORDER — SITAGLIPTIN PHOS-METFORMIN HCL 50-1000 MG PO TABS: 1.0000 | ORAL_TABLET | Freq: Every day | ORAL | 0 refills | Status: DC

## 2017-09-08 MED ORDER — LORATADINE 10 MG PO TABS: 10.0000 mg | ORAL_TABLET | Freq: Every day | ORAL | 0 refills | Status: DC

## 2017-09-08 MED ORDER — SERTRALINE HCL 100 MG PO TABS: 100.0000 mg | ORAL_TABLET | Freq: Every day | ORAL | 1 refills | Status: DC

## 2017-09-08 NOTE — Patient Instructions (Addendum)
It was a pleasure to see you today Ms. Czarnecki. Please make the following changes:  -Follow up with orthopedics to discuss right foot pain -Discontinue ibuprofen and take mobic for right foot pain -Decrease amlodipine dose from 10mg  daily to 5mg  daily -Please continue taking janumet 50-1000mg  daily and glipizide 5mg  daily -Please continue zoloft 100mg  qd -Please go to opthalmologist -Get mammogram done  If you have any questions or concerns, please call our clinic at [phone] between 9am-5pm and after hours call [phone] and ask for the internal medicine resident on call. If you feel you are having a medical emergency please call 911.   Thank you, we look forward to help you remain healthy!  Lars Mage, MD Internal Medicine PGY1

## 2017-09-09 DIAGNOSIS — J309 Allergic rhinitis, unspecified: Secondary | ICD-10-CM | POA: Insufficient documentation

## 2017-09-09 DIAGNOSIS — Z0000 Encounter for general adult medical examination without abnormal findings: Secondary | ICD-10-CM | POA: Insufficient documentation

## 2017-09-09 NOTE — Assessment & Plan Note (Signed)
The patient mentioned that she has been having pain on the medial and lateral aspect of the right foot. She mentions that the pain is worse at nighttime. She has been using gabapentin and ibuprofen for pain.   At last appointment the patient was placed on a gabapentin taper over the past month. The patient should currently be on gabapentin 400mg  tid. However, the patient states that she has been in extreme pain and has used extra gabapentin that she has left. She has been taking gabapentin 800mg  bid and 1200mg  qhs to total 2800mg  of gabapentin per day.   The patient is being followed by podiatry and she states that the insoles that they provided have not been helping her.   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 The patient has not developed good ergonomics to walk on right foot which is complicated by lower extremity diabetic neuropathy and osteoarthritic changes to right foot. Patient's abi's were assessed to note whether there is any vascular changes. ABI's were normal-left 1.15 and right 1.16.  Plan Recommended that the patient follow with orthopedics for further recommendations Recommended the patient try to taper gabapentin again Prescribed Mobic 7.5mg  qd ABI's normal

## 2017-09-09 NOTE — Assessment & Plan Note (Signed)
The patient mentioned that she has been having runny/stuffy nose, tearing in bilateral eyes, cough, postnasal drip. She states that she has been feeling this way every year.   Assessment The patient likely has allergic rhinitis   Plan -Prescribed loratadine 10mg  qd

## 2017-09-09 NOTE — Assessment & Plan Note (Signed)
The patient's PHQ9=8 which is unchanged from previous visit. The patient is currently taking zoloft 100mg  qd. She is tolerating the medicine well.   Plan -continue zoloft 100mg  qd

## 2017-09-09 NOTE — Assessment & Plan Note (Signed)
Hypertension The patient presented with a blood pressure of 101/80. She is currently taking amlodipine 10mg  qd, lisinopril 10mg  qd.   Assessment The patient's blood pressure is soft and therefore would like to cut back on her medication. Her BP goal should be <130/80 based on JNC 8 due to Diabetes mellitus comorbidity.   Plan -Decreased from amlodipine 10mg  to 5 mg  -Continue lisinopril 5mg  qd

## 2017-09-09 NOTE — Assessment & Plan Note (Signed)
The patient's last a1c=7.8 on 07/21/17. The patient mentioned that she has not been able to check her blood glucose due to her running out of blood glucose strips. The patient does not note any symptomatic episodes of hypoglycemia. She gained 3 lbs since last appt in February 2019.   The patient is currently taking glipizide 5mg  qd and janumet 50-1000mg  qd. She states that she has been compliant with medication.   Assessment The patient is compliant with her diabetes medicaition  Plan  The patient's A1C will be checked in 2 months  -continue glipizide 5mg  qd, janumet 50-1000mg  qd

## 2017-09-09 NOTE — Assessment & Plan Note (Signed)
The patient was placed on a gabapentin taper due to poor renal function. She should currently be on gabapentin 400mg  tid. However, the patient states that she has a lot of pain and therefore has not been able to taper. She is currently on gabapentin 800mg  bid and 1200 qhs by using gabapentin she has left over from previous scripts.   Plan Recommended the patient to retry the gabapentin taper with mobic added for pain control

## 2017-09-11 ENCOUNTER — Other Ambulatory Visit: Payer: Self-pay

## 2017-09-11 DIAGNOSIS — E1142 Type 2 diabetes mellitus with diabetic polyneuropathy: Secondary | ICD-10-CM

## 2017-09-11 MED ORDER — GABAPENTIN 400 MG PO CAPS: 400.0000 mg | ORAL_CAPSULE | Freq: Three times a day (TID) | ORAL | 1 refills | Status: DC

## 2017-09-11 NOTE — Telephone Encounter (Signed)
Patient has appointment with orthopedic on 4/2 to discuss about right foot pain. Encouraged the patient to try taper again due to worsening renal function.

## 2017-09-11 NOTE — Telephone Encounter (Signed)
gabapentin (NEURONTIN) 400 MG capsule, refill request @ [facility] on [address].

## 2017-09-12 DIAGNOSIS — M79671 Pain in right foot: Secondary | ICD-10-CM | POA: Diagnosis not present

## 2017-09-12 DIAGNOSIS — M25562 Pain in left knee: Secondary | ICD-10-CM | POA: Insufficient documentation

## 2017-09-15 ENCOUNTER — Other Ambulatory Visit: Payer: Self-pay | Admitting: Internal Medicine

## 2017-09-18 DIAGNOSIS — Z89439 Acquired absence of unspecified foot: Secondary | ICD-10-CM | POA: Diagnosis not present

## 2017-09-18 DIAGNOSIS — M79671 Pain in right foot: Secondary | ICD-10-CM | POA: Diagnosis not present

## 2017-09-18 DIAGNOSIS — E13621 Other specified diabetes mellitus with foot ulcer: Secondary | ICD-10-CM | POA: Diagnosis not present

## 2017-09-19 ENCOUNTER — Ambulatory Visit: Payer: Medicare Other | Admitting: Podiatry

## 2017-10-02 ENCOUNTER — Ambulatory Visit (INDEPENDENT_AMBULATORY_CARE_PROVIDER_SITE_OTHER): Payer: Medicare Other | Admitting: Podiatry

## 2017-10-02 ENCOUNTER — Encounter: Payer: Self-pay | Admitting: Podiatry

## 2017-10-02 ENCOUNTER — Other Ambulatory Visit: Payer: Self-pay | Admitting: Podiatry

## 2017-10-02 ENCOUNTER — Ambulatory Visit (INDEPENDENT_AMBULATORY_CARE_PROVIDER_SITE_OTHER): Payer: Medicare Other

## 2017-10-02 DIAGNOSIS — M79671 Pain in right foot: Secondary | ICD-10-CM

## 2017-10-02 DIAGNOSIS — I248 Other forms of acute ischemic heart disease: Secondary | ICD-10-CM | POA: Diagnosis not present

## 2017-10-02 DIAGNOSIS — L02619 Cutaneous abscess of unspecified foot: Secondary | ICD-10-CM

## 2017-10-02 DIAGNOSIS — L03119 Cellulitis of unspecified part of limb: Secondary | ICD-10-CM

## 2017-10-04 NOTE — Progress Notes (Signed)
Subjective:   Patient ID: [name], female   DOB: 67 y.o.   MRN: [id number]   HPI Patient presents with small blister on the outside of the right foot with localized redness and states is been present for around a week.  Patient has had previous amputation and he does have sugar and states her numbers have been running reasonably well recently and she is not noted to spike in number and does not have any systemic temperature and  has not noted any systemic signs of infection   ROS      Objective:  Physical Exam  Neurovascular status unchanged from previous visit with the area around the right fifth metatarsal that has a small blister measuring about 7 mm x 1 cm.  There is no odor emitting from it and minimal proximal edema erythema or drainage was noted.  Patient is on Keflex at the current time it is been on for 3 days and states that seems to have made a difference     Assessment:  Localized abscess right lateral foot with no indications currently of systemic infection     Plan:  Precautionary x-ray taken in sterile debridement of the area accomplished with sterile drainage of the abscess.  I then went ahead and cultured the area and did not currently note any odor or purulent drainage.  Patient will continue antibiotics begin soaks sterile dressing applied and will use Neosporin at home and was given strict instructions of any further redness or any systemic signs of infection were to occur she is to go straight to the emergency room.  Follow-up in 1 week  X-ray indicates no signs of ostial lysis or soft tissue issues associated with this current injury

## 2017-10-05 LAB — WOUND CULTURE
MICRO NUMBER:: 90489665
SPECIMEN QUALITY:: ADEQUATE

## 2017-10-08 ENCOUNTER — Other Ambulatory Visit: Payer: Self-pay | Admitting: Internal Medicine

## 2017-10-09 ENCOUNTER — Ambulatory Visit (INDEPENDENT_AMBULATORY_CARE_PROVIDER_SITE_OTHER): Payer: Medicare Other | Admitting: Podiatry

## 2017-10-09 ENCOUNTER — Encounter: Payer: Self-pay | Admitting: Podiatry

## 2017-10-09 DIAGNOSIS — L97512 Non-pressure chronic ulcer of other part of right foot with fat layer exposed: Secondary | ICD-10-CM

## 2017-10-09 DIAGNOSIS — E1149 Type 2 diabetes mellitus with other diabetic neurological complication: Secondary | ICD-10-CM | POA: Diagnosis not present

## 2017-10-09 NOTE — Patient Instructions (Signed)
Finish taking the antibiotics  Instructions for Wound Care  The most important step to healing a foot wound is to reduce the pressure on your foot - it is extremely important to stay off your foot as much as possible and wear the shoe/boot as instructed.  Cleanse your foot with saline wash or warm soapy water (dial antibacterial soap or similar).  Blot dry.  Apply prescribed medication to your wound and cover with gauze and a bandage.  May hold bandage in place with Coban (self sticky wrap), Ace bandage or tape.  You may find dressing supplies at your local [employer], [employer], drug store or medical supply store.  Monitor for any signs/symptoms of infection. If there is any increase in redness, red streaks, increase in drainage, warmth to your foot please give [place] a call. Also, if you start to run a fever or have "flu-like" symptoms that can also be a sign of infection. Call the office immediately if any occur or go directly to the emergency room.   If you have any questions, please feel free to give [place] a call at [phone] or if you are on "MyChart" you can always send me a message if needed.

## 2017-10-10 NOTE — Progress Notes (Signed)
Subjective: 67 year old female presents the office today for follow-up evaluation of localized infection of the right foot.  She states that she was walking barefoot and she developed a blister to the outside aspect of her right foot and that popped causing the wound.  She was last seen by Dr. Paulla Dolly for this.  She states that she does not keep a bandage on her foot.  She also continues to wear a sandal and not a supportive shoe.  She continues to have chronic foot pain on the right side but she also states that she is very active taking care her granddaughters child.  She denies any recent injury or falls.  Also since I last saw her she is to follow-up with her primary care physician doing the foot pain has referred her to orthopedics and she followed up with.  Denies any systemic complaints such as fevers, chills, nausea, vomiting. No acute changes since last appointment, and no other complaints at this time.   Objective: AAO x3, NAD DP/PT pulses palpable bilaterally, CRT less than 3 seconds Hyperkeratotic tissue with central ulceration present right foot fifth metatarsal head.  Bone edema there is a ulceration measuring less than 1 x 0.5 cm with a granular wound base and there is no probing, undermining or tunneling.  There is no surrounding erythema, ascending cellulitis.  There is no fluctuation or crepitation.  There is no malodor. Right second toe is doing very well and there is no significant hyperkeratotic tissue or ulceration of the toe. No other open lesions identified. No open lesions or pre-ulcerative lesions.  No pain with calf compression, swelling, warmth, erythema  Assessment: Ulceration right foot fifth metatarsal head  Plan: -All treatment options discussed with the patient including all alternatives, risks, complications.  -Wound culture was reviewed -Shavini rhythm without any complications to healthy, granular tissue #312 blade scalpel.  Continue antibiotic ointment dressing  changes daily.  I continue to urge her to wear it at least disappointed she would not wear a flat sandal.  She states that she goes barefoot quite a bit.  She is trying the diabetic shoes and inserts for shoes as they were helping but she has not been wearing them.  She continues to have ongoing right foot pain.  Unfortunately given her foot type with history of amputation she is continue to wear supportive shoe and try not to wear a flat sandal that she continues to wear.  She also is on her feet quite a bit going barefoot which is not helping. -Monitor for any clinical signs or symptoms of infection and directed to call the office immediately should any occur or go to the ER. -Patient encouraged to call the office with any questions, concerns, change in symptoms.   Trula Slade DPM

## 2017-10-11 ENCOUNTER — Other Ambulatory Visit: Payer: Self-pay | Admitting: Internal Medicine

## 2017-10-11 DIAGNOSIS — Z5189 Encounter for other specified aftercare: Secondary | ICD-10-CM

## 2017-10-11 DIAGNOSIS — I10 Essential (primary) hypertension: Secondary | ICD-10-CM

## 2017-10-11 HISTORY — DX: Encounter for other specified aftercare: Z51.89

## 2017-10-14 ENCOUNTER — Other Ambulatory Visit: Payer: Self-pay | Admitting: Internal Medicine

## 2017-10-17 ENCOUNTER — Other Ambulatory Visit: Payer: Self-pay | Admitting: Internal Medicine

## 2017-10-17 DIAGNOSIS — F322 Major depressive disorder, single episode, severe without psychotic features: Secondary | ICD-10-CM

## 2017-10-17 NOTE — Telephone Encounter (Signed)
Spoke with [facility]' pharmacist. She will resend e-request for sertraline 100 mg. Hubbard Hartshorn, RN, BSN

## 2017-10-17 NOTE — Telephone Encounter (Signed)
Per my documentation from last visit I am seeing that the patient is supposed to be on sertraline 100mg  rather than 50mg . The refill request is for 50mg . Do you by any chance know why?

## 2017-10-19 ENCOUNTER — Encounter: Payer: Self-pay | Admitting: Podiatry

## 2017-10-19 ENCOUNTER — Ambulatory Visit (INDEPENDENT_AMBULATORY_CARE_PROVIDER_SITE_OTHER): Payer: Medicare Other

## 2017-10-19 ENCOUNTER — Ambulatory Visit (INDEPENDENT_AMBULATORY_CARE_PROVIDER_SITE_OTHER): Payer: Medicare Other | Admitting: Podiatry

## 2017-10-19 VITALS — BP 127/75 | HR 74 | Temp 98.7°F

## 2017-10-19 DIAGNOSIS — I248 Other forms of acute ischemic heart disease: Secondary | ICD-10-CM

## 2017-10-19 DIAGNOSIS — E1149 Type 2 diabetes mellitus with other diabetic neurological complication: Secondary | ICD-10-CM

## 2017-10-19 DIAGNOSIS — L97512 Non-pressure chronic ulcer of other part of right foot with fat layer exposed: Secondary | ICD-10-CM | POA: Diagnosis not present

## 2017-10-19 MED ORDER — CLINDAMYCIN HCL 300 MG PO CAPS: 300.0000 mg | ORAL_CAPSULE | Freq: Three times a day (TID) | ORAL | 0 refills | Status: DC

## 2017-10-23 NOTE — Progress Notes (Signed)
Subjective: 67 year old female presents the office today for follow-up evaluation of a wound to the right foot, submetatarsal 5.  She said the area is callused over has gotten better however since I last saw her she went to the beach and she developed blisters to her toes and she got in the ocean as well.  She has noticed drainage coming from the wounds on the toes.  She does not wear shoes. Denies any systemic complaints such as fevers, chills, nausea, vomiting. No acute changes since last appointment, and no other complaints at this time.   Objective: AAO x3, NAD DP/PT pulses palpable bilaterally, CRT less than 3 seconds Hyperkeratotic lesion right submetatarsal 5.  Upon debridement there is no underlying ulceration, drainage or any clinical signs of infection noted today. Superficial granular wounds with small amount of fibrotic tissue along the second, third, fourth digits from what appears to be old blisters and these are the adjacent spaces between the toes.  Clear drainage is expressed there is no purulence.  There is mild edema and erythema to the digits.  There is no ascending cellulitis.  There is no fluctuation or crepitation. No open lesions or pre-ulcerative lesions.  No pain with calf compression, swelling, warmth, erythema  Assessment: Ulcerations right second, third, fourth toe  Plan: -All treatment options discussed with the patient including all alternatives, risks, complications.  -Area was cleaned.  We then use Actisorb to help with the drainage.  Recommend surgical shoe at all times.  Discussed importance of never going barefoot but she does often.  Prescribed Keflex. -Debrided hyperkeratotic tissue right submetatarsal 5 without any complications or bleeding -RTC 1 week or sooner if needed. -Patient encouraged to call the office with any questions, concerns, change in symptoms.   Trula Slade DPM

## 2017-11-01 DIAGNOSIS — N183 Chronic kidney disease, stage 3 (moderate): Secondary | ICD-10-CM | POA: Diagnosis not present

## 2017-11-01 DIAGNOSIS — E663 Overweight: Secondary | ICD-10-CM | POA: Diagnosis not present

## 2017-11-01 DIAGNOSIS — R748 Abnormal levels of other serum enzymes: Secondary | ICD-10-CM | POA: Diagnosis not present

## 2017-11-01 DIAGNOSIS — J449 Chronic obstructive pulmonary disease, unspecified: Secondary | ICD-10-CM | POA: Diagnosis not present

## 2017-11-01 DIAGNOSIS — E1142 Type 2 diabetes mellitus with diabetic polyneuropathy: Secondary | ICD-10-CM | POA: Diagnosis not present

## 2017-11-01 DIAGNOSIS — Z6829 Body mass index (BMI) 29.0-29.9, adult: Secondary | ICD-10-CM | POA: Diagnosis not present

## 2017-11-01 DIAGNOSIS — R29898 Other symptoms and signs involving the musculoskeletal system: Secondary | ICD-10-CM | POA: Diagnosis not present

## 2017-11-02 ENCOUNTER — Ambulatory Visit (INDEPENDENT_AMBULATORY_CARE_PROVIDER_SITE_OTHER): Payer: Medicare Other | Admitting: Podiatry

## 2017-11-02 DIAGNOSIS — I248 Other forms of acute ischemic heart disease: Secondary | ICD-10-CM

## 2017-11-02 DIAGNOSIS — E1149 Type 2 diabetes mellitus with other diabetic neurological complication: Secondary | ICD-10-CM

## 2017-11-02 DIAGNOSIS — L97511 Non-pressure chronic ulcer of other part of right foot limited to breakdown of skin: Secondary | ICD-10-CM

## 2017-11-02 MED ORDER — CEPHALEXIN 500 MG PO CAPS: 500.0000 mg | ORAL_CAPSULE | Freq: Two times a day (BID) | ORAL | 0 refills | Status: DC

## 2017-11-06 ENCOUNTER — Emergency Department (HOSPITAL_COMMUNITY): Payer: Medicare Other

## 2017-11-06 ENCOUNTER — Encounter (HOSPITAL_COMMUNITY): Payer: Self-pay | Admitting: *Deleted

## 2017-11-06 ENCOUNTER — Inpatient Hospital Stay (HOSPITAL_COMMUNITY)
Admission: EM | Admit: 2017-11-06 | Discharge: 2017-11-10 | DRG: 811 | Disposition: A | Discharge status: Home Health | Payer: Medicare Other | Attending: Student in an Organized Health Care Education/Training Program | Admitting: Student in an Organized Health Care Education/Training Program

## 2017-11-06 ENCOUNTER — Other Ambulatory Visit: Payer: Self-pay

## 2017-11-06 DIAGNOSIS — D509 Iron deficiency anemia, unspecified: Secondary | ICD-10-CM | POA: Diagnosis not present

## 2017-11-06 DIAGNOSIS — I110 Hypertensive heart disease with heart failure: Secondary | ICD-10-CM | POA: Diagnosis present

## 2017-11-06 DIAGNOSIS — K250 Acute gastric ulcer with hemorrhage: Secondary | ICD-10-CM | POA: Diagnosis present

## 2017-11-06 DIAGNOSIS — R55 Syncope and collapse: Secondary | ICD-10-CM | POA: Diagnosis not present

## 2017-11-06 DIAGNOSIS — Z8349 Family history of other endocrine, nutritional and metabolic diseases: Secondary | ICD-10-CM

## 2017-11-06 DIAGNOSIS — E669 Obesity, unspecified: Secondary | ICD-10-CM | POA: Diagnosis present

## 2017-11-06 DIAGNOSIS — M6281 Muscle weakness (generalized): Secondary | ICD-10-CM | POA: Diagnosis present

## 2017-11-06 DIAGNOSIS — S0990XA Unspecified injury of head, initial encounter: Secondary | ICD-10-CM | POA: Diagnosis not present

## 2017-11-06 DIAGNOSIS — W1830XA Fall on same level, unspecified, initial encounter: Secondary | ICD-10-CM | POA: Diagnosis present

## 2017-11-06 DIAGNOSIS — I5032 Chronic diastolic (congestive) heart failure: Secondary | ICD-10-CM | POA: Diagnosis present

## 2017-11-06 DIAGNOSIS — S0003XA Contusion of scalp, initial encounter: Secondary | ICD-10-CM | POA: Diagnosis present

## 2017-11-06 DIAGNOSIS — K219 Gastro-esophageal reflux disease without esophagitis: Secondary | ICD-10-CM | POA: Diagnosis present

## 2017-11-06 DIAGNOSIS — K5900 Constipation, unspecified: Secondary | ICD-10-CM | POA: Diagnosis present

## 2017-11-06 DIAGNOSIS — D649 Anemia, unspecified: Secondary | ICD-10-CM | POA: Diagnosis not present

## 2017-11-06 DIAGNOSIS — Z8673 Personal history of transient ischemic attack (TIA), and cerebral infarction without residual deficits: Secondary | ICD-10-CM | POA: Diagnosis not present

## 2017-11-06 DIAGNOSIS — Z801 Family history of malignant neoplasm of trachea, bronchus and lung: Secondary | ICD-10-CM

## 2017-11-06 DIAGNOSIS — K760 Fatty (change of) liver, not elsewhere classified: Secondary | ICD-10-CM | POA: Diagnosis present

## 2017-11-06 DIAGNOSIS — D62 Acute posthemorrhagic anemia: Secondary | ICD-10-CM | POA: Diagnosis not present

## 2017-11-06 DIAGNOSIS — E860 Dehydration: Secondary | ICD-10-CM | POA: Diagnosis present

## 2017-11-06 DIAGNOSIS — E1143 Type 2 diabetes mellitus with diabetic autonomic (poly)neuropathy: Secondary | ICD-10-CM | POA: Diagnosis present

## 2017-11-06 DIAGNOSIS — E781 Pure hyperglyceridemia: Secondary | ICD-10-CM | POA: Diagnosis present

## 2017-11-06 DIAGNOSIS — J449 Chronic obstructive pulmonary disease, unspecified: Secondary | ICD-10-CM | POA: Diagnosis present

## 2017-11-06 DIAGNOSIS — D519 Vitamin B12 deficiency anemia, unspecified: Secondary | ICD-10-CM | POA: Diagnosis present

## 2017-11-06 DIAGNOSIS — Z96652 Presence of left artificial knee joint: Secondary | ICD-10-CM | POA: Diagnosis present

## 2017-11-06 DIAGNOSIS — J45909 Unspecified asthma, uncomplicated: Secondary | ICD-10-CM | POA: Diagnosis present

## 2017-11-06 DIAGNOSIS — K7460 Unspecified cirrhosis of liver: Secondary | ICD-10-CM | POA: Diagnosis present

## 2017-11-06 DIAGNOSIS — I951 Orthostatic hypotension: Secondary | ICD-10-CM | POA: Diagnosis present

## 2017-11-06 DIAGNOSIS — R195 Other fecal abnormalities: Secondary | ICD-10-CM | POA: Diagnosis present

## 2017-11-06 DIAGNOSIS — M4800 Spinal stenosis, site unspecified: Secondary | ICD-10-CM | POA: Diagnosis present

## 2017-11-06 DIAGNOSIS — I700 Atherosclerosis of aorta: Secondary | ICD-10-CM | POA: Diagnosis present

## 2017-11-06 DIAGNOSIS — Z90710 Acquired absence of both cervix and uterus: Secondary | ICD-10-CM

## 2017-11-06 DIAGNOSIS — L8990 Pressure ulcer of unspecified site, unspecified stage: Secondary | ICD-10-CM | POA: Diagnosis present

## 2017-11-06 DIAGNOSIS — I493 Ventricular premature depolarization: Secondary | ICD-10-CM | POA: Diagnosis present

## 2017-11-06 DIAGNOSIS — Z888 Allergy status to other drugs, medicaments and biological substances status: Secondary | ICD-10-CM

## 2017-11-06 DIAGNOSIS — S199XXA Unspecified injury of neck, initial encounter: Secondary | ICD-10-CM | POA: Diagnosis not present

## 2017-11-06 DIAGNOSIS — Z7982 Long term (current) use of aspirin: Secondary | ICD-10-CM

## 2017-11-06 DIAGNOSIS — R42 Dizziness and giddiness: Secondary | ICD-10-CM | POA: Diagnosis not present

## 2017-11-06 DIAGNOSIS — F17210 Nicotine dependence, cigarettes, uncomplicated: Secondary | ICD-10-CM | POA: Diagnosis present

## 2017-11-06 DIAGNOSIS — I781 Nevus, non-neoplastic: Secondary | ICD-10-CM | POA: Diagnosis present

## 2017-11-06 DIAGNOSIS — Z7984 Long term (current) use of oral hypoglycemic drugs: Secondary | ICD-10-CM

## 2017-11-06 DIAGNOSIS — Z89421 Acquired absence of other right toe(s): Secondary | ICD-10-CM

## 2017-11-06 DIAGNOSIS — Z79899 Other long term (current) drug therapy: Secondary | ICD-10-CM

## 2017-11-06 DIAGNOSIS — G8929 Other chronic pain: Secondary | ICD-10-CM | POA: Diagnosis present

## 2017-11-06 DIAGNOSIS — M1990 Unspecified osteoarthritis, unspecified site: Secondary | ICD-10-CM | POA: Diagnosis present

## 2017-11-06 DIAGNOSIS — Z791 Long term (current) use of non-steroidal anti-inflammatories (NSAID): Secondary | ICD-10-CM

## 2017-11-06 DIAGNOSIS — Z8249 Family history of ischemic heart disease and other diseases of the circulatory system: Secondary | ICD-10-CM

## 2017-11-06 DIAGNOSIS — Z9049 Acquired absence of other specified parts of digestive tract: Secondary | ICD-10-CM

## 2017-11-06 DIAGNOSIS — Z6829 Body mass index (BMI) 29.0-29.9, adult: Secondary | ICD-10-CM

## 2017-11-06 LAB — CBC WITH DIFFERENTIAL/PLATELET
Abs Immature Granulocytes: 0.1 10*3/uL (ref 0.0–0.1)
Basophils Absolute: 0.1 10*3/uL (ref 0.0–0.1)
Basophils Relative: 1 %
EOS ABS: 0.2 10*3/uL (ref 0.0–0.7)
Eosinophils Relative: 2 %
HCT: 28.5 % — ABNORMAL LOW (ref 36.0–46.0)
Hemoglobin: 8.7 g/dL — ABNORMAL LOW (ref 12.0–15.0)
IMMATURE GRANULOCYTES: 1 %
Lymphocytes Relative: 20 %
Lymphs Abs: 1.6 10*3/uL (ref 0.7–4.0)
MCH: 30 pg (ref 26.0–34.0)
MCHC: 30.5 g/dL (ref 30.0–36.0)
MCV: 98.3 fL (ref 78.0–100.0)
MONOS PCT: 7 %
Monocytes Absolute: 0.6 10*3/uL (ref 0.1–1.0)
NEUTROS PCT: 69 %
Neutro Abs: 5.7 10*3/uL (ref 1.7–7.7)
Platelets: 165 10*3/uL (ref 150–400)
RBC: 2.9 MIL/uL — ABNORMAL LOW (ref 3.87–5.11)
RDW: 15.9 % — AB (ref 11.5–15.5)
WBC: 8.2 10*3/uL (ref 4.0–10.5)

## 2017-11-06 LAB — URINALYSIS, ROUTINE W REFLEX MICROSCOPIC
BILIRUBIN URINE: NEGATIVE
Glucose, UA: NEGATIVE mg/dL
Ketones, ur: NEGATIVE mg/dL
Nitrite: NEGATIVE
PROTEIN: 30 mg/dL — AB
SPECIFIC GRAVITY, URINE: 1.018 (ref 1.005–1.030)
pH: 5 (ref 5.0–8.0)

## 2017-11-06 LAB — GLUCOSE, CAPILLARY: GLUCOSE-CAPILLARY: 87 mg/dL (ref 65–99)

## 2017-11-06 LAB — POC OCCULT BLOOD, ED: FECAL OCCULT BLD: POSITIVE — AB

## 2017-11-06 LAB — COMPREHENSIVE METABOLIC PANEL
ALT: 81 U/L — AB (ref 14–54)
AST: 28 U/L (ref 15–41)
Albumin: 3.3 g/dL — ABNORMAL LOW (ref 3.5–5.0)
Alkaline Phosphatase: 50 U/L (ref 38–126)
Anion gap: 11 (ref 5–15)
BUN: 42 mg/dL — AB (ref 6–20)
CALCIUM: 9.1 mg/dL (ref 8.9–10.3)
CO2: 18 mmol/L — ABNORMAL LOW (ref 22–32)
CREATININE: 1.27 mg/dL — AB (ref 0.44–1.00)
Chloride: 112 mmol/L — ABNORMAL HIGH (ref 101–111)
GFR calc Af Amer: 51 mL/min — ABNORMAL LOW (ref >60)
GFR, EST NON AFRICAN AMERICAN: 44 mL/min — AB (ref >60)
Glucose, Bld: 125 mg/dL — ABNORMAL HIGH (ref 65–99)
POTASSIUM: 4.2 mmol/L (ref 3.5–5.1)
Sodium: 141 mmol/L (ref 135–145)
Total Bilirubin: 0.3 mg/dL (ref 0.3–1.2)
Total Protein: 6 g/dL — ABNORMAL LOW (ref 6.5–8.1)

## 2017-11-06 LAB — I-STAT CG4 LACTIC ACID, ED: Lactic Acid, Venous: 1.84 mmol/L (ref 0.5–1.9)

## 2017-11-06 LAB — TROPONIN I: Troponin I: <0.03 ng/mL (ref <0.03)

## 2017-11-06 MED ORDER — SODIUM CHLORIDE 0.9 % IV BOLUS
1000.0000 mL | Freq: Once | INTRAVENOUS | Status: AC
  Administered 2017-11-06: 1000 mL via INTRAVENOUS

## 2017-11-06 MED ORDER — FENTANYL CITRATE (PF) 100 MCG/2ML IJ SOLN
50.0000 ug | Freq: Once | INTRAMUSCULAR | Status: AC
  Administered 2017-11-06: 50 ug via INTRAVENOUS
  Filled 2017-11-06: qty 2

## 2017-11-06 MED ORDER — SODIUM CHLORIDE 0.9 % IV SOLN
INTRAVENOUS | Status: DC
Start: 2017-11-06 — End: 2017-11-09
  Administered 2017-11-06 – 2017-11-08 (×2): via INTRAVENOUS

## 2017-11-06 MED ORDER — PANTOPRAZOLE SODIUM 20 MG PO TBEC: 20.0000 mg | DELAYED_RELEASE_TABLET | Freq: Every day | ORAL | Status: DC | PRN

## 2017-11-06 MED ORDER — ACETAMINOPHEN 325 MG PO TABS
650.0000 mg | ORAL_TABLET | Freq: Four times a day (QID) | ORAL | Status: DC | PRN
  Administered 2017-11-06 – 2017-11-09 (×5): 650 mg via ORAL
  Filled 2017-11-06 (×6): qty 2

## 2017-11-06 MED ORDER — GABAPENTIN 400 MG PO CAPS
400.0000 mg | ORAL_CAPSULE | Freq: Three times a day (TID) | ORAL | Status: DC
  Administered 2017-11-06 – 2017-11-10 (×10): 400 mg via ORAL
  Filled 2017-11-06 (×10): qty 1

## 2017-11-06 MED ORDER — KETOROLAC TROMETHAMINE 15 MG/ML IJ SOLN: 15.0000 mg | Freq: Four times a day (QID) | INTRAMUSCULAR | Status: DC | PRN

## 2017-11-06 MED ORDER — ACETAMINOPHEN 650 MG RE SUPP: 650.0000 mg | Freq: Four times a day (QID) | RECTAL | Status: DC | PRN

## 2017-11-06 MED ORDER — INSULIN ASPART 100 UNIT/ML ~~LOC~~ SOLN: 0.0000 [IU] | Freq: Three times a day (TID) | SUBCUTANEOUS | Status: DC

## 2017-11-06 MED ORDER — ENOXAPARIN SODIUM 40 MG/0.4ML ~~LOC~~ SOLN
40.0000 mg | SUBCUTANEOUS | Status: DC
  Administered 2017-11-06: 40 mg via SUBCUTANEOUS
  Filled 2017-11-06: qty 0.4

## 2017-11-06 MED ORDER — SERTRALINE HCL 100 MG PO TABS
100.0000 mg | ORAL_TABLET | Freq: Every day | ORAL | Status: DC
  Administered 2017-11-07 – 2017-11-10 (×4): 100 mg via ORAL
  Filled 2017-11-06 (×4): qty 1

## 2017-11-06 NOTE — ED Notes (Signed)
Pur-wick placed, instructions given for use.

## 2017-11-06 NOTE — H&P (Addendum)
Date: 11/06/2017               Patient Name:  [name] MRN: [id number]  DOB: [birth year] Age / Sex: 67 y.o., female   PCP: Lars Mage, MD         Medical Service: Internal Medicine Teaching Service         Attending Physician: Dr. Evette Doffing, Mallie Mussel, *    First Contact: Dr. Ronalee Red Pager: [phone]  Second Contact: Dr. Philipp Ovens Pager: [phone]       After Hours (After 5p/  First Contact Pager: [phone]  weekends / holidays): Second Contact Pager: [phone]   Chief Complaint: lightheadedness, passing out  History of Present Illness:  [name] is a 67yo female with PMH of HFpEF, COPD, hx of TIA, diabetes, and HTN who presents with 1 week of progressive lightheadedness and syncopal episode.  Patient endorses progressive lightheadedness with standing and generalized weakness in the last week, culminating in a syncopal episode this morning that brought her into the emergency room. She states that she had gotten up to walk somewhere when she noted feeling very weak and like she was about to pass out, then subsequently fell on the floor, hitting the back of her head. She denies diaphoresis, chest pain, or palpitations prior to the episode. The syncopal episode was witnessed by her daughter. No noted shaking/convulsions, urinary incontinence, or tongue biting. She has had a syncopal episode in the past, which was prior to her 05/2017 admission, during which she was admitted for hypotension and sepsis of unclear source. Other than that episode, she denies a history of syncope or seizures.  At baseline, patient reports that she does not eat or drink much. She states she has small bites of food and quickly feels full. She drinks 2 sodas and 4 water bottles daily. This has been her routine for the last ~2 years. Weight has been stable in the last 6 months per chart review. She does have GERD, but takes a PPI, which she states is helpful and rarely has reflux symptoms. She denies  esophageal pain or abdominal pain with eating. Endorses constipation in the last week, which she does occasionally experience. Denies hematemesis or melena. Colonoscopy in 03/2017 showed 38m sessile serrated polyp without evidence of dysplasia or malignancy and small internal hemorrhoids.  Denies SOB, CP, fever, cough, dysuria, abdominal distention, lower extremity edema, new rashes, focal weakness or numbness, recent travel, or sick contacts.  She was seen in clinic in 08/2017 and was noted to have a low BP. She was thus instructed to decrease her BP medications. It is unclear whether she did this.  ED Course: - BP 84/50, temp 98.4, RR 21, HR 85, O2 92% on RA. Orthostatics positive. - Hb 8.7, MCV 98. Normal lactic acid. Normal troponin. CMP with Cr 1.27, ALT 81. BCx pending. - CXR with no acute cardiopulmonary disease. CT head/c-spine negative for acute pathology. EKG with NSR, PVC, T wave inversions in V4-V6 consistent with prior, no ST elevation. - S/p 2L IV NS bolus  Meds:  Current Meds  Medication Sig  . albuterol (PROVENTIL HFA;VENTOLIN HFA) 108 (90 Base) MCG/ACT inhaler Inhale 1-2 puffs into the lungs every 6 (six) hours as needed for wheezing or shortness of breath.  .Marland KitchenamLODipine (NORVASC) 5 MG tablet Take 1 tablet (5 mg total) by mouth daily.  .Marland Kitchenaspirin 81 MG tablet Take 81 mg by mouth every morning.   .Marland Kitchenaspirin-acetaminophen-caffeine (EXCEDRIN MIGRAINE) 250-250-65 MG tablet Take 2  tablets by mouth every 8 (eight) hours as needed for headache.   . cephALEXin (KEFLEX) 500 MG capsule Take 1 capsule (500 mg total) by mouth 2 (two) times daily. (Patient taking differently: Take 500 mg by mouth 2 (two) times daily. To be completed on 11/07/17)  . fluticasone (FLONASE) 50 MCG/ACT nasal spray Place 1 spray into both nostrils as needed for allergies or rhinitis.   Marland Kitchen gabapentin (NEURONTIN) 400 MG capsule Take 1 capsule (400 mg total) by mouth 3 (three) times daily.  Marland Kitchen glipiZIDE (GLUCOTROL XL) 5  MG 24 hr tablet Take 1 tablet (5 mg total) by mouth daily with breakfast. (Patient taking differently: Take 5 mg by mouth at bedtime. )  . lisinopril (PRINIVIL,ZESTRIL) 10 MG tablet Take 1 tablet (10 mg total) by mouth daily.  . meloxicam (MOBIC) 7.5 MG tablet TAKE 1 TABLET(7.5 MG) BY MOUTH DAILY  . pantoprazole (PROTONIX) 20 MG tablet Take 1 tablet (20 mg total) by mouth daily as needed. (Patient taking differently: Take 20 mg by mouth daily as needed for heartburn or indigestion. )  . rosuvastatin (CRESTOR) 40 MG tablet Take 1 tablet (40 mg total) by mouth daily.  . sertraline (ZOLOFT) 100 MG tablet TAKE 1 TABLET(100 MG) BY MOUTH DAILY  . sitaGLIPtin-metformin (JANUMET) 50-1000 MG tablet Take 1 tablet by mouth daily.   Allergies: Allergies as of 11/06/2017 - Review Complete 11/06/2017  Allergen Reaction Noted  . Anesthesia s-i-60 Other (See Comments) 01/20/2017  . Flexeril [cyclobenzaprine] Other (See Comments) 08/12/2012  . Soma [carisoprodol] Itching and Rash 05/13/2012   Past Medical History:  Diagnosis Date  . Allergy   . Anemia   . Arthritis    "left knee" (09/08/2015)  . Asthma   . Brachial plexus disorders   . CHF (congestive heart failure) ([place])   . Chronic pain    neck pain, headache, neuropathy  . COPD (chronic obstructive pulmonary disease) ([place])    SEES ONLY DR. PATEL   . Depression    "when my husband passed in 2013"  . GERD (gastroesophageal reflux disease)   . Hypertension   . Hypertriglyceridemia   . Migraine    "monthly" (09/08/2015)  . Neuromuscular disorder ([facility])    neuropathy  . Puncture wound of foot, right 05/17/2012   Tetanus shot 3 yrs ago at [facility] in [place], per pt report   . Stroke ([place])    TIAS    IN [place]   4 YRS AGO   . Type II diabetes mellitus ([place]) 2010   diagnosed around 2010, only ever on metformin   Family History:  Family History  Problem Relation Age of Onset  . Hyperlipidemia Mother   . Heart attack Father 19  .  Hypertension Father   . Cancer Paternal Grandfather        Lung cancer  . Cancer Maternal Grandmother   . Heart attack Maternal Grandmother   . Colon cancer Neg Hx   . Colon polyps Neg Hx   . Esophageal cancer Neg Hx   . Rectal cancer Neg Hx   . Stomach cancer Neg Hx    Social History:  - Denies tobacco use, alcohol use, or other illicit substances  Review of Systems: A complete ROS was negative except as per HPI. - She was also seen recently for proximal muscle weakness, which she states has been an issue for 8-9 years. ESR was normal in 07/2017 and CK was mildly elevated in 500s. She is on a statin. She states  that she has seen Rheumatology and Neurology in the past, without an etiology for her muscle weakness. She did have what sounds like a conduction study done in [place] about 4-5 years ago, which she states showed that she had "bad muscle weakness". She does have a Copywriter, advertising, whom she saw about a week ago.  Physical Exam: Blood pressure 102/62, pulse 79, temperature 98.4 F (36.9 C), temperature source Oral, resp. rate (!) 23, height 5' 6" (1.676 m), weight 183 lb (83 kg), SpO2 100 %.  GEN: Appears tired. Alert and oriented. No acute distress. HENT: Small hematoma on posterior head. Moist mucous membranes. Multiple telangiectasias on bilateral cheeks and chin. EYES: PERRL. Sclera non-icteric. Conjunctiva clear. RESP: Clear to auscultation bilaterally. No wheezes, rales, or rhonchi. No increased work of breathing. CV: Normal rate and regular rhythm. Possible systolic murmur heard, but heart sounds are distant. No carotid bruits. No LE edema. ABD: Soft. Obese. Non-tender. Non-distended. Normoactive bowel sounds. EXT: No edema. Hands and feet feel cold, 2+ DP pulses bilaterally. No Janeway lesions, Osler nodes, or splinter hemorrhages. 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 Bilateral hammertoe. Small punctate healing scab on lateral 5th metatarsal pad without erythema or  swelling. TTP on left shin, which is where she fell - no skin breaks. NEURO: Cranial nerves II-XII grossly intact. Able to lift all four extremities against gravity. No apparent audiovisual hallucinations. Speech fluent and appropriate. PSYCH: Patient is calm and pleasant. Appropriate affect. Well-groomed; speech is appropriate and on-subject.  Labs CBC Latest Ref Rng & Units 11/06/2017 06/16/2017 06/09/2017  WBC 4.0 - 10.5 K/uL 8.2 10.2 5.7  Hemoglobin 12.0 - 15.0 g/dL 8.7(L) 10.8(L) 10.2(L)  Hematocrit 36.0 - 46.0 % 28.5(L) 33.0(L) 31.5(L)  Platelets 150 - 400 K/uL 165 224 126(L)   CMP Latest Ref Rng & Units 11/06/2017 07/10/2017 06/16/2017  Glucose 65 - 99 mg/dL 125(H) 165(H) 198(H)  BUN 6 - 20 mg/dL 42(H) 18 26  Creatinine 0.44 - 1.00 mg/dL 1.27(H) 1.36(H) 1.40(H)  Sodium 135 - 145 mmol/L 141 137 146(H)  Potassium 3.5 - 5.1 mmol/L 4.2 4.1 3.6  Chloride 101 - 111 mmol/L 112(H) 107 107(H)  CO2 22 - 32 mmol/L 18(L) 20(L) 21  Calcium 8.9 - 10.3 mg/dL 9.1 9.6 9.5  Total Protein 6.5 - 8.1 g/dL 6.0(L) - -  Total Bilirubin 0.3 - 1.2 mg/dL 0.3 - -  Alkaline Phos 38 - 126 U/L 50 - -  AST 15 - 41 U/L 28 - -  ALT 14 - 54 U/L 81(H) - -   FOBT positive Troponin negative Lactic acid 1.84 BCx pending  EKG: personally reviewed my interpretation is NSR, PVC, T wave inversions in V4-V6 consistent with prior, no ST elevation.  CXR: personally reviewed my interpretation is slight rotation to right, no pleural effusions or consolidation  CT head/c-spine 1. No acute intracranial pathology. 2. No acute osseous injury of the cervical spine.  Assessment & Plan by Problem: Active Problems:   Syncope  Ms. Goral is a 67yo female with PMH of HFpEF, COPD, hx of TIA, diabetes, and HTN who presents with 1 week of progressive lightheadedness with standing and syncopal episode, found to be orthostatic in the ER. No clear source of possible infection and she has no leukocytosis, fever, or lactic acidosis. She  is not on chronic prednisone per our available records.   Orthostatic hypotension Syncopal episode Most likely etiology for her hypotension and syncopal episode at this point is reduced PO intake, however the reason for her early  satiety and decreased intake is unclear. Oliguric, but creatinine at baseline. No clear source of a possible infection, however cultures are pending. Will evaluate for adrenal insufficiency, although patient not on chronic steroids and BMP not quite consistent. ALT mildly elevated, hepatitis panel negative in 05/2017, and she did had diffuse fatty infiltration in the liver on RUQ U/S in 05/2017 but it would be unlikely for her to develop cirrhosis in such a short time period. Another thought would be autonomic dysfunction related to B12 deficiency or diabetes. She is noted to have a murmur on exam, but it is quite soft in intensity, which would not be consistent with severe AS. TTE in 05/2017 negative for aortic valve abnormality. - Telemetry - S/p 2L IV NS bolus - IV NS 125cc/hr - F/u BCx, UA, and UCx - AM cortisol - CBC, BMP in AM  Acute on chronic normocytic anemia Hb 8.7 on admission, baseline ~10-12. FOBT positive. Endorses dark stools, but no hematemesis. Ferritin was 30 in 2016. Colonoscopy in 03/2017 showed one 65m sessile serrated polyp without evidence of dysplasia or malignancy. No previous EGD available in our records. - CBC in AM  HTN Home regimen includes amlodipine 550mdaily and lisinopril 1028maily - Hold home amlodipine and lisinopril 2/2 hypotension  Proximal muscle weakness 8-9 year history of proximal muscle weakness. ESR normal most recently with elevated CK to 500. She is on a statin. She reports she has seen Rheumatology and Neurology in the past without a clear diagnosis. No skin rash to suggest dermatomyositis, another possibility could be hypothyroidism. - Will attempt to obtain outpatient Rheumatology records tomorrow - Hold home  rosuvastatin  Diabetes A1c 7.8 in 07/2017. Home regimen includes sitagliptin-metformin 50-1000m77mily and glipizde 5mg 57m - SSI-M TID WC - CBG monitoring  GERD - Pantoprazole 20mg 70my PRN  HFpEF TTE in 05/2017 shows LVEF 65-70% and grade 2 DD. Euvolemic on exam, although getting fluids 2/2 hypotension - Continue to monitor  Diet: CM diet VTE PPx: Lovenox Code Status: Full code Dispo: Admit patient to Observation with expected length of stay less than 2 midnights.  Signed: Jemiah Cuadra,Colbert Ewing/27/2019, 4:48 PM  Pager: [phone]

## 2017-11-06 NOTE — ED Triage Notes (Signed)
C/o dizziness for 1 week , states its all the time however worse with movement standing. States she got up off the bed carrying her laptop walked into the hallway , states she started rocking and she went down. C/o arms and legs feeling really heavy. Hit her head on the frame of the door and then the ground , states she was unresp. For short period of time.

## 2017-11-06 NOTE — ED Provider Notes (Signed)
[facility] EMERGENCY DEPARTMENT Provider Note   CSN: [id number] Arrival date & time: 11/06/17  1301     History   Chief Complaint Chief Complaint  Patient presents with  . Fall    HPI [name] is a 67 y.o. female.  HPI  With a known history of congestive heart failure, COPD, acid reflux, prior stroke, diabetic and high blood pressure who has had an admission last year when she was septic presents with approximately 1 week of a progressive and gradually worsening orthostatic lightheadedness which culminated with a syncopal episode today prior to arrival when she stood up, try to walk out of the bedroom, towards the bathroom and ultimately passed out falling backwards and striking her head both on the door in the ground.  There was a loss of consciousness that lasted approximately 15 to 30 seconds after which time she came around.  There is no seizure activity, no vomiting.  The patient states she is felt progressively weak, she has had difficulty standing and walking stating that she has to hold onto things to walk because she becomes severely lightheaded.  She has had decreased oral intake, decreased fluid intake but denies coughing shortness of breath chest pain swelling of the legs rashes or urinary symptoms or diarrhea.  Past Medical History:  Diagnosis Date  . Allergy   . Anemia   . Arthritis    "left knee" (09/08/2015)  . Asthma   . Brachial plexus disorders   . CHF (congestive heart failure) ([place])   . Chronic pain    neck pain, headache, neuropathy  . COPD (chronic obstructive pulmonary disease) ([place])    SEES ONLY DR. PATEL   . Depression    "when my husband passed in 2013"  . GERD (gastroesophageal reflux disease)   . Hypertension   . Hypertriglyceridemia   . Migraine    "monthly" (09/08/2015)  . Neuromuscular disorder (HCC)    neuropathy  . Puncture wound of foot, right 05/17/2012   Tetanus shot 3 yrs ago at [facility] in [place], per pt  report   . Stroke ([place])    TIAS    IN [place]   4 YRS AGO   . Type II diabetes mellitus ([place]) 2010   diagnosed around 2010, only ever on metformin    Patient Active Problem List   Diagnosis Date Noted  . Syncope 11/06/2017  . Healthcare maintenance 09/09/2017  . Allergic rhinitis 09/09/2017  . Major depressive disorder 07/21/2017  . Proximal muscle weakness 07/12/2017  . Dysuria 06/16/2017  . Demand ischemia ([place]) 06/16/2017  . Petechiae 06/16/2017  . AKI (acute kidney injury) ([place])   . Right foot pain 04/22/2017  . Screening breast examination 01/21/2017  . Blister (nonthermal) of right ring finger, initial encounter 01/21/2017  . History of total knee replacement, 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  . Neck muscle strain 08/19/2016  . Onychomycosis of multiple toenails with type 2 diabetes mellitus ([place]) 11/04/2015  . Skin lesions 11/04/2015  . Screening for colon cancer 11/04/2015  . Orthostatic syncope   . Obesity (BMI 30.0-34.9) 05/27/2015  . Fatigue 05/11/2015  . Leg ulcer, left ([place]) 03/05/2015  . Recurrent falls 03/05/2015  . Primary osteoarthritis of left knee 09/22/2014  . Esophageal reflux 07/18/2014  . Hyperlipidemia associated with type 2 diabetes mellitus ([place]) 02/26/2014  . Tobacco use disorder 07/22/2013  . Headache 06/28/2013  . Diabetic peripheral neuropathy associated with type 2 diabetes mellitus ([place]) 06/14/2013  . Hx-TIA (transient ischemic attack) 03/10/2013  .  Mild depression ([place]) 12/13/2012  . Elevated LFTs 06/01/2012  . Diabetes mellitus type 2, controlled, with complications ([place]) 96/29/5284  . Hypertension 05/13/2012    Past Surgical History:  Procedure Laterality Date  . 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 ARTHRODESIS METATARSAL     RIGHT 5 TH   . BILATERAL SALPINGOOPHORECTOMY Bilateral 1986   "after hemtoma evacuations; had  to cut me open"  . CARPAL TUNNEL RELEASE Bilateral   . COLONOSCOPY     10 + yrs ago- pt unsure- was in [place]- pt states  MD will not send records but colon was normal per pt.   Marland Kitchen DILATION AND CURETTAGE OF UTERUS  ~ 1982   S/P miscarriage  . HEMATOMA EVACUATION Right 1986 x 2   "OVARY; w/in 1 wk after hysterectomy"  . KNEE ARTHROSCOPY     LEFT  . LAPAROSCOPIC CHOLECYSTECTOMY    . SHOULDER ARTHROSCOPY W/ ROTATOR CUFF REPAIR Bilateral   . TONSILLECTOMY    . TOTAL KNEE ARTHROPLASTY Left 09/30/2016   Procedure: LEFT TOTAL KNEE ARTHROPLASTY;  Surgeon: Netta Cedars, MD;  Location: [facility];  Service: Orthopedics;  Laterality: Left;  . TUBAL LIGATION    . VAGINAL HYSTERECTOMY  1986     OB History   None      Home Medications    Prior to Admission medications   Medication Sig Start Date End Date Taking? Authorizing Provider  albuterol (PROVENTIL HFA;VENTOLIN HFA) 108 (90 Base) MCG/ACT inhaler Inhale 1-2 puffs into the lungs every 6 (six) hours as needed for wheezing or shortness of breath. 02/10/17  Yes Lucious Groves, DO  amLODipine (NORVASC) 5 MG tablet Take 1 tablet (5 mg total) by mouth daily. 09/08/17 11/06/17 Yes Chundi, Vahini, MD  aspirin 81 MG tablet Take 81 mg by mouth every morning.    Yes [provider]  aspirin-acetaminophen-caffeine (EXCEDRIN MIGRAINE) [id number] MG tablet Take 2 tablets by mouth every 8 (eight) hours as needed for headache.    Yes [provider]  cephALEXin (KEFLEX) 500 MG capsule Take 1 capsule (500 mg total) by mouth 2 (two) times daily. Patient taking differently: Take 500 mg by mouth 2 (two) times daily. To be completed on 11/07/17 11/02/17  Yes Trula Slade, DPM  fluticasone [facility]) 50 MCG/ACT nasal spray Place 1 spray into both nostrils as needed for allergies or rhinitis.    Yes [provider]  gabapentin (NEURONTIN) 400 MG capsule Take 1 capsule (400 mg total) by mouth 3 (three) times daily. 09/11/17 12/10/17 Yes  Chundi, Vahini, MD  glipiZIDE (GLUCOTROL XL) 5 MG 24 hr tablet Take 1 tablet (5 mg total) by mouth daily with breakfast. Patient taking differently: Take 5 mg by mouth at bedtime.  02/10/17  Yes Lucious Groves, DO  lisinopril (PRINIVIL,ZESTRIL) 10 MG tablet Take 1 tablet (10 mg total) by mouth daily. 10/12/17 01/10/18 Yes Chundi, Vahini, MD  meloxicam (MOBIC) 7.5 MG tablet TAKE 1 TABLET(7.5 MG) BY MOUTH DAILY 10/10/17  Yes Chundi, Vahini, MD  pantoprazole (PROTONIX) 20 MG tablet Take 1 tablet (20 mg total) by mouth daily as needed. Patient taking differently: Take 20 mg by mouth daily as needed for heartburn or indigestion.  02/10/17  Yes Lucious Groves, DO  rosuvastatin (CRESTOR) 40 MG tablet Take 1 tablet (40 mg total) by mouth daily. 02/08/17  Yes Bartholomew Crews, MD  sertraline (ZOLOFT) 100 MG tablet TAKE 1 TABLET(100 MG) BY MOUTH DAILY 10/17/17  Yes Chundi, Vahini, MD  sitaGLIPtin-metformin (JANUMET) 50-1000 MG tablet Take 1 tablet by mouth daily. 09/08/17 11/06/17 Yes Chundi, Vahini, MD  glucose blood (ONETOUCH VERIO) test strip Check blood sugar 3x/day 09/08/17   Chundi, Verne Spurr, MD  loratadine (CLARITIN) 10 MG tablet Take 1 tablet (10 mg total) by mouth daily. Patient not taking: Reported on 11/06/2017 09/08/17 10/08/17  Lars Mage, MD  sitaGLIPtin (JANUVIA) 50 MG tablet Take 1 tablet (50 mg total) by mouth daily. Patient not taking: Reported on 11/06/2017 06/17/17   Alphonzo Grieve, MD  SUMAtriptan (IMITREX) 25 MG tablet Take 1 tablet (25mg ) only if you have a headache. May repeat in 2 hours if headache persists or recurs. Do not take if you do not have a headache. Patient not taking: Reported on 11/06/2017 06/10/17   Welford Roche, MD    Family History Family History  Problem Relation Age of Onset  . Hyperlipidemia Mother   . Heart attack Father 58  . Hypertension Father   . Cancer Paternal Grandfather        Lung cancer  . Cancer Maternal Grandmother   . Heart attack Maternal  Grandmother   . Colon cancer Neg Hx   . Colon polyps Neg Hx   . Esophageal cancer Neg Hx   . Rectal cancer Neg Hx   . Stomach cancer Neg Hx     Social History Social History   Tobacco Use  . Smoking status: Current Some Day Smoker    Packs/day: 0.10    Years: 47.00    Pack years: 4.70    Types: Cigarettes    Last attempt to quit: 08/11/2016    Years since quitting: 1.2  . Smokeless tobacco: Never Used  . Tobacco comment:  2 cigs per day  Substance Use Topics  . Alcohol use: No    Alcohol/week: 0.0 oz  . Drug use: No     Allergies   Anesthesia s-i-60; Flexeril [cyclobenzaprine]; and Soma [carisoprodol]   Review of Systems Review of Systems  All other systems reviewed and are negative.    Physical Exam Updated Vital Signs BP (!) 84/50 (BP Location: Right Arm)   Pulse 85   Temp 98.4 F (36.9 C) (Oral)   Resp (!) 21   Ht 5\' 6"  (1.676 m)   Wt 83 kg (183 lb)   SpO2 92%   BMI 29.54 kg/m   Physical Exam  Constitutional: She appears well-developed and well-nourished. No distress.  HENT:  Head: Normocephalic.  Mouth/Throat: Oropharynx is clear and moist. No oropharyngeal exudate.  Small hematoma to the posterior left occiput, no laceration but the skin has been contused and has a spot of blood.  No active bleeding.  No depression of the skull palpated  Eyes: Pupils are equal, round, and reactive to light. Conjunctivae and EOM are normal. Right eye exhibits no discharge. Left eye exhibits no discharge. No scleral icterus.  Neck: Normal range of motion. Neck supple. No JVD present. No thyromegaly present.  Cardiovascular: Normal rate, regular rhythm, normal heart sounds and intact distal pulses. Exam reveals no gallop and no friction rub.  No murmur heard. Strong pulses at the radial arteries bilaterally, no JVD  Pulmonary/Chest: Effort normal and breath sounds normal. No respiratory distress. She has no wheezes. She has no rales.  Abdominal: Soft. Bowel sounds are  normal. She exhibits no distension and no mass. There is no tenderness.  Genitourinary:  Genitourinary Comments: Chaperone present for rectal exam, normal-appearing external structures, no fissures, no visible external hemorrhoids, no palpable internal hemorrhoids or masses, mucoid stool present in the vault, no formed stool, no gross blood, Hemoccult negative  Musculoskeletal: Normal range of motion. She exhibits no edema or tenderness.  Right lower extremity with missing fifth digit, no significant drainage rashes or open wounds  Lymphadenopathy:    She has no cervical adenopathy.  Neurological: She is alert. Coordination normal.  The patient is generally weak appearing but is able to perform all commands including equal grips, normal strength in the lower extremities, follows commands without difficulty, clear speech, normal pupils and extraocular movements, cranial nerves III through XII are normal  Skin: Skin is warm and dry. No rash noted. No erythema.  Psychiatric: She has a normal mood and affect. Her behavior is normal.  Nursing note and vitals reviewed.    ED Treatments / Results  Labs (all labs ordered are listed, but only abnormal results are displayed) Labs Reviewed  CBC WITH DIFFERENTIAL/PLATELET - Abnormal; Notable for the following components:      Result Value   RBC 2.90 (*)    Hemoglobin 8.7 (*)    HCT 28.5 (*)    RDW 15.9 (*)    All other components within normal limits  COMPREHENSIVE METABOLIC PANEL - Abnormal; Notable for the following components:   Chloride 112 (*)    CO2 18 (*)    Glucose, Bld 125 (*)    BUN 42 (*)    Creatinine, Ser 1.27 (*)    Total Protein 6.0 (*)    Albumin 3.3 (*)    ALT 81 (*)    GFR calc non Af Amer 44 (*)    GFR calc Af Amer 51 (*)    All other components within normal limits  URINE CULTURE  CULTURE, BLOOD (ROUTINE X 2)  CULTURE, BLOOD (ROUTINE X 2)  TROPONIN I  URINALYSIS, ROUTINE W REFLEX MICROSCOPIC  I-STAT CG4 LACTIC ACID,  ED  I-STAT CG4 LACTIC ACID, ED    EKG EKG Interpretation  Date/Time:  Monday Nov 06 2017 13:03:58 EDT Ventricular Rate:  86 PR Interval:    QRS Duration: 89 QT Interval:  368 QTC Calculation: 441 R Axis:   30 Text Interpretation:  Sinus rhythm Ventricular premature complex Abnormal T, consider ischemia, diffuse leads Minimal ST elevation, anterior leads since last tracing no significant change Confirmed by Noemi Chapel [phone]) on 11/06/2017 1:41:15 PM   Radiology Ct Head Wo Contrast  Result Date: 11/06/2017 CLINICAL DATA:  Poly trauma, head injury EXAM: CT HEAD WITHOUT CONTRAST CT CERVICAL SPINE WITHOUT CONTRAST TECHNIQUE: Multidetector CT imaging of the head and cervical spine was performed following the standard protocol without intravenous contrast. Multiplanar CT image reconstructions of the cervical spine were also generated. COMPARISON:  None. FINDINGS: CT HEAD FINDINGS Brain: No evidence of acute infarction, hemorrhage, extra-axial collection, ventriculomegaly, or mass effect. Generalized cerebral atrophy. Periventricular white matter low attenuation likely secondary to microangiopathy. Vascular: Cerebrovascular atherosclerotic calcifications are noted. Skull: Negative for fracture or focal lesion. Sinuses/Orbits: Visualized portions of the orbits are unremarkable. Right maxillary sinus mucous retention cyst. Small air-fluid level in the left maxillary sinus. Other: Left parietal scalp hematoma. CT CERVICAL SPINE FINDINGS Alignment: Normal. Skull base and vertebrae: No acute fracture. No primary bone lesion or focal pathologic process. Soft tissues and spinal canal: No prevertebral fluid or swelling. No visible canal hematoma. Disc levels: Degenerative disc disease with disc height loss at C4-5 with  a mild broad-based disc osteophyte complex. Disc spaces are otherwise maintained. Upper chest: Lung apices are clear. Other: No fluid collection or hematoma. IMPRESSION: 1. No acute  intracranial pathology. 2. No acute osseous injury of the cervical spine. Electronically Signed   By: Kathreen Devoid   On: 11/06/2017 14:41   Ct Cervical Spine Wo Contrast  Result Date: 11/06/2017 CLINICAL DATA:  Poly trauma, head injury EXAM: CT HEAD WITHOUT CONTRAST CT CERVICAL SPINE WITHOUT CONTRAST TECHNIQUE: Multidetector CT imaging of the head and cervical spine was performed following the standard protocol without intravenous contrast. Multiplanar CT image reconstructions of the cervical spine were also generated. COMPARISON:  None. FINDINGS: CT HEAD FINDINGS Brain: No evidence of acute infarction, hemorrhage, extra-axial collection, ventriculomegaly, or mass effect. Generalized cerebral atrophy. Periventricular white matter low attenuation likely secondary to microangiopathy. Vascular: Cerebrovascular atherosclerotic calcifications are noted. Skull: Negative for fracture or focal lesion. Sinuses/Orbits: Visualized portions of the orbits are unremarkable. Right maxillary sinus mucous retention cyst. Small air-fluid level in the left maxillary sinus. Other: Left parietal scalp hematoma. CT CERVICAL SPINE FINDINGS Alignment: Normal. Skull base and vertebrae: No acute fracture. No primary bone lesion or focal pathologic process. Soft tissues and spinal canal: No prevertebral fluid or swelling. No visible canal hematoma. Disc levels: Degenerative disc disease with disc height loss at C4-5 with a mild broad-based disc osteophyte complex. Disc spaces are otherwise maintained. Upper chest: Lung apices are clear. Other: No fluid collection or hematoma. IMPRESSION: 1. No acute intracranial pathology. 2. No acute osseous injury of the cervical spine. Electronically Signed   By: Kathreen Devoid   On: 11/06/2017 14:41   Dg Chest Port 1 View  Result Date: 11/06/2017 CLINICAL DATA:  Syncope. EXAM: PORTABLE CHEST 1 VIEW COMPARISON:  06/06/2017 FINDINGS: Mild right hemidiaphragm elevation. Patient rotated right. Midline  trachea. Normal heart size. Atherosclerosis in the transverse aorta. No pleural effusion or pneumothorax. Clear lungs. IMPRESSION: No acute cardiopulmonary disease. Aortic and branch vessel atherosclerosis. Electronically Signed   By: Abigail Miyamoto M.D.   On: 11/06/2017 13:59    Procedures .Critical Care Performed by: Noemi Chapel, MD Authorized by: Noemi Chapel, MD   Critical care provider statement:    Critical care time (minutes):  35   Critical care time was exclusive of:  Separately billable procedures and treating other patients and teaching time   Critical care was time spent personally by me on the following activities:  Blood draw for specimens, development of treatment plan with patient or surrogate, discussions with consultants, evaluation of patient's response to treatment, examination of patient, obtaining history from patient or surrogate, ordering and performing treatments and interventions, ordering and review of laboratory studies, ordering and review of radiographic studies, pulse oximetry, re-evaluation of patient's condition and review of old charts   (including critical care time)  Medications Ordered in ED Medications  fentaNYL (SUBLIMAZE) injection 50 mcg (has no administration in time range)  sodium chloride 0.9 % bolus 1,000 mL (has no administration in time range)  sodium chloride 0.9 % bolus 1,000 mL (1,000 mLs Intravenous New Bag/Given 11/06/17 1358)    Initial Impression / Assessment and Plan / ED Course  I have reviewed the triage vital signs and the nursing notes.  Pertinent labs & imaging results that were available during my care of the patient were reviewed by me and considered in my medical decision making (see chart for details).    The cause of the patient's lightheadedness is unclear, she is definitely had decreased  oral intake and appears likely dehydrated however the EKG is nondiagnostic, there is some nonspecific ST segments and nonspecific T  waves, she will need further work-up for both infection, cardiac causes, dehydration or otherwise.  CT scan to evaluate for trauma to the head / brain  The patient continues to be hypotensive despite 1 L of IV fluids.  She is required a second liter of IV fluids.  Her blood pressure is now slowly rising.  While she has no leukocytosis she does have mild renal dysfunction, she has an elevated creatinine and BUN at 42 but this is suggestive of prerenal azotemia.  Her CO2 is decreased to 18 suggestive of a slight acidosis likely related to renal dysfunction and dehydration.  Thankfully her lactic acid was 1.8, no leukocytosis, no signs of infiltrate on the x-ray.  She is oliguric despite 2 L of IV fluids.  She will need to have further evaluation inpatient as well as resuscitation.  I discussed her care with the internal medicine resident who will admit.  - hemoccult turned positive  Final Clinical Impressions(s) / ED Diagnoses   Final diagnoses:  Syncope and collapse  Dehydration  Anemia, unspecified type  Hematoma of scalp, initial encounter      Noemi Chapel, MD 11/06/17 1528

## 2017-11-06 NOTE — Progress Notes (Signed)
New Admission Note: patient transferred from [facility] to room 5M12  Arrival Method: via stretcher Mental Orientation: alert and oriented x 4 Telemetry: placed on box 9 Assessment: Completed Skin: Intact, abrasion to back of head secondary to fall IV:  R AC and  L AC Pain: denies Tubes: None Safety Measures: Safety Fall Prevention Plan has been discussed  Admission: To be completed 5 [place] Orientation: Patient has been orientated to the room, unit and staff.   Family:  None at bedside  Orders to be reviewed and implemented. Will continue to monitor the patient. Call light has been placed within reach and bed alarm has been activated.   Mady Gemma, BSN, RN-BC Phone: [phone]

## 2017-11-07 ENCOUNTER — Inpatient Hospital Stay (HOSPITAL_COMMUNITY): Payer: Medicare Other | Admitting: Certified Registered"

## 2017-11-07 ENCOUNTER — Encounter (HOSPITAL_COMMUNITY): Payer: Self-pay | Admitting: Internal Medicine

## 2017-11-07 ENCOUNTER — Other Ambulatory Visit: Payer: Self-pay

## 2017-11-07 ENCOUNTER — Encounter (HOSPITAL_COMMUNITY)
Admission: EM | Disposition: A | Discharge status: Home Health | Payer: Self-pay | Source: Home / Self Care | Attending: Student in an Organized Health Care Education/Training Program

## 2017-11-07 DIAGNOSIS — K760 Fatty (change of) liver, not elsewhere classified: Secondary | ICD-10-CM

## 2017-11-07 DIAGNOSIS — K259 Gastric ulcer, unspecified as acute or chronic, without hemorrhage or perforation: Secondary | ICD-10-CM | POA: Diagnosis not present

## 2017-11-07 DIAGNOSIS — Z888 Allergy status to other drugs, medicaments and biological substances status: Secondary | ICD-10-CM | POA: Diagnosis not present

## 2017-11-07 DIAGNOSIS — I10 Essential (primary) hypertension: Secondary | ICD-10-CM | POA: Diagnosis not present

## 2017-11-07 DIAGNOSIS — E1143 Type 2 diabetes mellitus with diabetic autonomic (poly)neuropathy: Secondary | ICD-10-CM | POA: Diagnosis not present

## 2017-11-07 DIAGNOSIS — E781 Pure hyperglyceridemia: Secondary | ICD-10-CM | POA: Diagnosis present

## 2017-11-07 DIAGNOSIS — R55 Syncope and collapse: Secondary | ICD-10-CM

## 2017-11-07 DIAGNOSIS — K250 Acute gastric ulcer with hemorrhage: Secondary | ICD-10-CM | POA: Diagnosis not present

## 2017-11-07 DIAGNOSIS — Z79899 Other long term (current) drug therapy: Secondary | ICD-10-CM | POA: Diagnosis not present

## 2017-11-07 DIAGNOSIS — W1830XA Fall on same level, unspecified, initial encounter: Secondary | ICD-10-CM | POA: Diagnosis present

## 2017-11-07 DIAGNOSIS — E119 Type 2 diabetes mellitus without complications: Secondary | ICD-10-CM | POA: Diagnosis not present

## 2017-11-07 DIAGNOSIS — D62 Acute posthemorrhagic anemia: Secondary | ICD-10-CM | POA: Diagnosis not present

## 2017-11-07 DIAGNOSIS — Z7982 Long term (current) use of aspirin: Secondary | ICD-10-CM | POA: Diagnosis not present

## 2017-11-07 DIAGNOSIS — D509 Iron deficiency anemia, unspecified: Secondary | ICD-10-CM | POA: Diagnosis not present

## 2017-11-07 DIAGNOSIS — Z7984 Long term (current) use of oral hypoglycemic drugs: Secondary | ICD-10-CM | POA: Diagnosis not present

## 2017-11-07 DIAGNOSIS — R948 Abnormal results of function studies of other organs and systems: Secondary | ICD-10-CM

## 2017-11-07 DIAGNOSIS — R1032 Left lower quadrant pain: Secondary | ICD-10-CM | POA: Diagnosis not present

## 2017-11-07 DIAGNOSIS — E538 Deficiency of other specified B group vitamins: Secondary | ICD-10-CM | POA: Diagnosis not present

## 2017-11-07 DIAGNOSIS — L8990 Pressure ulcer of unspecified site, unspecified stage: Secondary | ICD-10-CM

## 2017-11-07 DIAGNOSIS — Z9049 Acquired absence of other specified parts of digestive tract: Secondary | ICD-10-CM | POA: Diagnosis not present

## 2017-11-07 DIAGNOSIS — J449 Chronic obstructive pulmonary disease, unspecified: Secondary | ICD-10-CM | POA: Diagnosis not present

## 2017-11-07 DIAGNOSIS — G8929 Other chronic pain: Secondary | ICD-10-CM | POA: Diagnosis present

## 2017-11-07 DIAGNOSIS — D649 Anemia, unspecified: Secondary | ICD-10-CM

## 2017-11-07 DIAGNOSIS — Z89421 Acquired absence of other right toe(s): Secondary | ICD-10-CM | POA: Diagnosis not present

## 2017-11-07 DIAGNOSIS — R195 Other fecal abnormalities: Secondary | ICD-10-CM | POA: Diagnosis not present

## 2017-11-07 DIAGNOSIS — I5032 Chronic diastolic (congestive) heart failure: Secondary | ICD-10-CM | POA: Diagnosis not present

## 2017-11-07 DIAGNOSIS — I110 Hypertensive heart disease with heart failure: Secondary | ICD-10-CM | POA: Diagnosis not present

## 2017-11-07 DIAGNOSIS — Z90710 Acquired absence of both cervix and uterus: Secondary | ICD-10-CM | POA: Diagnosis not present

## 2017-11-07 DIAGNOSIS — K279 Peptic ulcer, site unspecified, unspecified as acute or chronic, without hemorrhage or perforation: Secondary | ICD-10-CM | POA: Diagnosis not present

## 2017-11-07 DIAGNOSIS — I509 Heart failure, unspecified: Secondary | ICD-10-CM | POA: Diagnosis not present

## 2017-11-07 DIAGNOSIS — Z96652 Presence of left artificial knee joint: Secondary | ICD-10-CM | POA: Diagnosis present

## 2017-11-07 DIAGNOSIS — I700 Atherosclerosis of aorta: Secondary | ICD-10-CM | POA: Diagnosis not present

## 2017-11-07 DIAGNOSIS — E860 Dehydration: Secondary | ICD-10-CM

## 2017-11-07 DIAGNOSIS — Z791 Long term (current) use of non-steroidal anti-inflammatories (NSAID): Secondary | ICD-10-CM | POA: Diagnosis not present

## 2017-11-07 DIAGNOSIS — D519 Vitamin B12 deficiency anemia, unspecified: Secondary | ICD-10-CM | POA: Diagnosis not present

## 2017-11-07 DIAGNOSIS — Z8673 Personal history of transient ischemic attack (TIA), and cerebral infarction without residual deficits: Secondary | ICD-10-CM | POA: Diagnosis not present

## 2017-11-07 DIAGNOSIS — D6489 Other specified anemias: Secondary | ICD-10-CM | POA: Diagnosis not present

## 2017-11-07 DIAGNOSIS — K219 Gastro-esophageal reflux disease without esophagitis: Secondary | ICD-10-CM | POA: Diagnosis present

## 2017-11-07 HISTORY — PX: ESOPHAGOGASTRODUODENOSCOPY (EGD) WITH PROPOFOL: SHX5813

## 2017-11-07 HISTORY — PX: BIOPSY: SHX5522

## 2017-11-07 LAB — CBC
HCT: 20.1 % — ABNORMAL LOW (ref 36.0–46.0)
HEMOGLOBIN: 6 g/dL — AB (ref 12.0–15.0)
MCH: 29.4 pg (ref 26.0–34.0)
MCHC: 29.9 g/dL — AB (ref 30.0–36.0)
MCV: 98.5 fL (ref 78.0–100.0)
Platelets: 128 10*3/uL — ABNORMAL LOW (ref 150–400)
RBC: 2.04 MIL/uL — ABNORMAL LOW (ref 3.87–5.11)
RDW: 16.2 % — ABNORMAL HIGH (ref 11.5–15.5)
WBC: 6.3 10*3/uL (ref 4.0–10.5)

## 2017-11-07 LAB — BASIC METABOLIC PANEL
ANION GAP: 6 (ref 5–15)
BUN: 45 mg/dL — ABNORMAL HIGH (ref 6–20)
CO2: 20 mmol/L — AB (ref 22–32)
Calcium: 8.4 mg/dL — ABNORMAL LOW (ref 8.9–10.3)
Chloride: 118 mmol/L — ABNORMAL HIGH (ref 101–111)
Creatinine, Ser: 0.98 mg/dL (ref 0.44–1.00)
GFR calc Af Amer: >60 mL/min (ref >60)
GFR calc non Af Amer: 60 mL/min — ABNORMAL LOW (ref >60)
GLUCOSE: 183 mg/dL — AB (ref 65–99)
POTASSIUM: 4.3 mmol/L (ref 3.5–5.1)
Sodium: 144 mmol/L (ref 135–145)

## 2017-11-07 LAB — PREPARE RBC (CROSSMATCH)

## 2017-11-07 LAB — ABO/RH: ABO/RH(D): A POS

## 2017-11-07 LAB — RETICULOCYTES
RBC.: 2.03 MIL/uL — AB (ref 3.87–5.11)
RETIC COUNT ABSOLUTE: 81.2 10*3/uL (ref 19.0–186.0)
RETIC CT PCT: 4 % — AB (ref 0.4–3.1)

## 2017-11-07 LAB — GLUCOSE, CAPILLARY
GLUCOSE-CAPILLARY: 172 mg/dL — AB (ref 65–99)
GLUCOSE-CAPILLARY: 159 mg/dL — AB (ref 65–99)
GLUCOSE-CAPILLARY: 58 mg/dL — AB (ref 65–99)
Glucose-Capillary: 126 mg/dL — ABNORMAL HIGH (ref 65–99)
Glucose-Capillary: 108 mg/dL — ABNORMAL HIGH (ref 65–99)

## 2017-11-07 LAB — HEMOGLOBIN AND HEMATOCRIT, BLOOD
HCT: 25.5 % — ABNORMAL LOW (ref 36.0–46.0)
HEMOGLOBIN: 7.9 g/dL — AB (ref 12.0–15.0)

## 2017-11-07 LAB — CORTISOL-AM, BLOOD: Cortisol - AM: 14.3 ug/dL (ref 6.7–22.6)

## 2017-11-07 LAB — FERRITIN: Ferritin: 10 ng/mL — ABNORMAL LOW (ref 11–307)

## 2017-11-07 SURGERY — ESOPHAGOGASTRODUODENOSCOPY (EGD) WITH PROPOFOL
Anesthesia: Monitor Anesthesia Care

## 2017-11-07 MED ORDER — PANTOPRAZOLE SODIUM 40 MG PO TBEC
40.0000 mg | DELAYED_RELEASE_TABLET | Freq: Every day | ORAL | Status: DC
  Administered 2017-11-07 – 2017-11-10 (×4): 40 mg via ORAL
  Filled 2017-11-07 (×5): qty 1

## 2017-11-07 MED ORDER — PROPOFOL 500 MG/50ML IV EMUL
INTRAVENOUS | Status: DC | PRN
  Administered 2017-11-07: 150 ug/kg/min via INTRAVENOUS

## 2017-11-07 MED ORDER — PHENYLEPHRINE HCL 10 MG/ML IJ SOLN
INTRAMUSCULAR | Status: DC | PRN
  Administered 2017-11-07: 80 ug via INTRAVENOUS
  Administered 2017-11-07: 120 ug via INTRAVENOUS
  Administered 2017-11-07: 160 ug via INTRAVENOUS
  Administered 2017-11-07: 120 ug via INTRAVENOUS

## 2017-11-07 MED ORDER — LACTATED RINGERS IV SOLN
INTRAVENOUS | Status: DC
  Administered 2017-11-07: 14:00:00 via INTRAVENOUS

## 2017-11-07 MED ORDER — SODIUM CHLORIDE 0.9 % IV SOLN
Freq: Once | INTRAVENOUS | Status: AC
  Administered 2017-11-07: 11:00:00 via INTRAVENOUS

## 2017-11-07 MED ORDER — PROPOFOL 10 MG/ML IV BOLUS
INTRAVENOUS | Status: DC | PRN
  Administered 2017-11-07: 40 mg via INTRAVENOUS

## 2017-11-07 MED ORDER — INSULIN ASPART 100 UNIT/ML ~~LOC~~ SOLN
0.0000 [IU] | SUBCUTANEOUS | Status: DC
  Administered 2017-11-07 (×2): 3 [IU] via SUBCUTANEOUS

## 2017-11-07 SURGICAL SUPPLY — 14 items

## 2017-11-07 NOTE — Progress Notes (Signed)
Subjective: 67 year old female presents the office today for follow-up evaluation of ulcerations to her right foot.  She says the wounds are getting better but her skin is peeling.  Denies any drainage or pus and denies any pain swelling or any redness.  No recent injury.  She still wearing open toe shoe.  Denies any systemic complaints such as fevers, chills, nausea, vomiting. No acute changes since last appointment, and no other complaints at this time.   Objective: AAO x3, NAD DP/PT pulses palpable bilaterally, CRT less than 3 seconds The only ulceration still remains on the medial aspect of the right third toe.  This is superficial and there is no drainage or pus.  The other wounds appear to have healed.  There is some dry skin present to the toes there is no skin fissures or any other open sores.  There is no significant edema there is no erythema there is no ascending cellulitis.  No fluctuation or crepitation. No open lesions or pre-ulcerative lesions.  No pain with calf compression, swelling, warmth, erythema  Assessment: Healing wound right foot  Plan: -All treatment options discussed with the patient including all alternatives, risks, complications.  -Overall the wounds appear to be improved.  I want her to continue with Actisorb dressing to the right third toe over the area the ulceration.  Continue offloading.  Also discussed the importance of not we are going barefoot. -Monitor for any clinical signs or symptoms of infection and directed to call the office immediately should any occur or go to the ER. -Patient encouraged to call the office with any questions, concerns, change in symptoms.   Trula Slade DPM

## 2017-11-07 NOTE — Op Note (Signed)
[facility] Patient Name: [name] Procedure Date : 11/07/2017 MRN: [id number] Attending MD: Docia Chuck. Henrene Pastor , MD Date of Birth: 07/23/1950 CSN: [id number] Age: 67 Admit Type: Inpatient Procedure:                Upper GI endoscopy, with biopsies Indications:              Acute post hemorrhagic anemia, Heme positive stool;                            had been using NSAIDs Providers:                Docia Chuck. Henrene Pastor, MD, Presley Raddle, RN, Elspeth Cho Tech., Technician, Tawni Carnes, CRNA Referring MD:              Medicines:                Monitored Anesthesia Care Complications:            No immediate complications. Estimated Blood Loss:     Estimated blood loss: none. Procedure:                Pre-Anesthesia Assessment:                           - Prior to the procedure, a History and Physical                            was performed, and patient medications and                            allergies were reviewed. The patient's tolerance of                            previous anesthesia was also reviewed. The risks                            and benefits of the procedure and the sedation                            options and risks were discussed with the patient.                            All questions were answered, and informed consent                            was obtained. Prior Anticoagulants: The patient has                            taken no previous anticoagulant or antiplatelet                            agents. ASA Grade Assessment: II - A patient with  mild systemic disease. After reviewing the risks                            and benefits, the patient was deemed in                            satisfactory condition to undergo the procedure.                           After obtaining informed consent, the endoscope was                            passed under direct vision. Throughout the                   procedure, the patient's blood pressure, pulse, and                            oxygen saturations were monitored continuously. The                            EG-[id number] ([id number]) scope was introduced through the                            mouth, and advanced to the second part of duodenum.                            The upper GI endoscopy was accomplished without                            difficulty. The patient tolerated the procedure                            well. Scope In: Scope Out: Findings:      The esophagus was normal. No varices.      Two non-bleeding clean-based gastric ulcers with no stigmata of bleeding       were found in the prepyloric region of the stomach. The largest lesion       was 7 mm in largest dimension. Biopsies were taken with a cold forceps       for Helicobacter pylori testing using CLOtest.      The stomach was otherwise normal.      The examined duodenum was normal.      The cardia and gastric fundus were normal on retroflexion. Impression:               - Normal esophagus.                           - Non-bleeding gastric ulcers with no stigmata of                            bleeding. Biopsied.                           - Normal stomach otherwise.                           -  Normal examined duodenum. Recommendation:           1. Pantoprazole 40 mg daily, orally                           2. Avoid NSAIDs                           3. Resume previous diet                           4. Okay for discharge from GI standpoint                           5. GI office follow-up with Ellouise Newer PA-C                            Friday, 11/25/2018 10 AM.                           Will sign off Procedure Code(s):        --- Professional ---                           [phone], Esophagogastroduodenoscopy, flexible,                            transoral; with biopsy, single or multiple Diagnosis Code(s):        --- Professional ---                            K25.9, Gastric ulcer, unspecified as acute or                            chronic, without hemorrhage or perforation                           D62, Acute posthemorrhagic anemia                           R19.5, Other fecal abnormalities CPT copyright 2017 [employer]. All rights reserved. The codes documented in this report are preliminary and upon coder review may  be revised to meet current compliance requirements. Docia Chuck. Henrene Pastor, MD 11/07/2017 3:36:20 PM This report has been signed electronically. Number of Addenda: 0

## 2017-11-07 NOTE — Progress Notes (Signed)
   Subjective:  Ms. Grossberg was seen lying in bed this morning. She continues to endorse not feeling well. States that she has not noticed increased abdominal girth or that her pants are too tight. She was recently prescribed meloxicam and takes excedrin PRN. She reports darker stools and has had 1-2 BM in the last two days. She also does have some days of constipation. We discussed anemia this morning and our concern that she may be bleeding from her GI tract. Giving 1u pRBC. GI has seen her this morning, planning on EGD today.  Objective:  Vital signs in last 24 hours: Vitals:   11/06/17 1817 11/06/17 1900 11/06/17 2052 11/07/17 0453  BP: (!) 85/65  119/68 111/72  Pulse: 78  90 (!) 103  Resp: 16  16 16   Temp: 98.3 F (36.8 C)  98.4 F (36.9 C) 98.5 F (36.9 C)  TempSrc: Oral  Oral Oral  SpO2: 99%  100% 99%  Weight:  183 lb 10.3 oz (83.3 kg)    Height:       GEN: Lying in bed in NAD CV: NR & RR, no m/r/g, distant heart sounds PULM: CTAB, no wheezes or rales, no increased work of breathing on RA ABD: Soft, obese, no TTP MSK: No LE edema SKIN: Telangiectasias on bilateral cheeks  Assessment/Plan:  Active Problems:   Syncope  Ms. Alwine is a 67yo female with PMH of HFpEF, COPD, hx of TIA, diabetes, and HTN who presents with orthostatic hypotension and syncope most likely 2/2 symptomatic anemia, with Hb 6.0 this morning. Transfusing 1 unit pRBC and continuing IVF. GI has been consulted and planning for EGD today.  Symptomatic anemia Hb 6.0 this morning, with baseline ~10-12. Endorses dark stools but no BRBPR. FOBT positive, planning for EGD today. She has been using NSAIDs, putting her at increased risk for ulcer. EGD will also be useful to assess for esophageal varices in the setting of fatty liver. - GI consulted; appreciate their assistance - EGD scheduled for today at 2:15pm - NPO - Continue IV NS 125cc/hr - Transfuse 1u pRBC - F/u post-transfusion H&H - D/c Lovenox,  start SCDs - F/u BCx and UCx - CBC in AM - F/u ferritin and reticulocyte count - Pantoprazole 40mg  daily  Fatty liver Seen on RUQ U/S in 05/2017. ALT mildly elevated, albumin low, and platelets low this morning. With hypotension, I wonder if this could be related to decompensated cirrhosis, however I am not sure if this would develop in such a short time period. INR was normal in 05/2017. - PT/INR  Chronic proximal muscle weakness Uncertain etiology, has seen Rheumatology and Neurology in the past. - Will attempt to obtain outpatient Rheumatology records - Holding home rosuvastatin  HTN BP 111/72 this AM - Holding home amlodipine 5mg  daily and lisinopril 10mg  daily 2/2 hypotension  Diabetes Home regimen includes sitagliptin-metformin 50-1000mg  daily and glipizde 5mg  QHS - SSI-M q4h while NPO - CBG monitoring  HFpEF TTE in 05/2017 shows LVEF 65-70% and grade 2 DD. Euvolemic on exam, although getting fluids and blood 2/2 anemia. Currently saturating well on RA. - Continue to monitor  Dispo: Anticipated discharge in approximately 1-2 day(s), pending results of EGD  Colbert Ewing, MD 11/07/2017, 6:36 AM Pager: Mamie Nick [phone]

## 2017-11-07 NOTE — Anesthesia Preprocedure Evaluation (Addendum)
Anesthesia Evaluation  Patient identified by MRN, date of birth, ID band Patient awake    Reviewed: Allergy & Precautions, NPO status , Patient's Chart, lab work & pertinent test results  Airway Mallampati: II  TM Distance: >3 FB     Dental  (+) Edentulous Upper, Partial Lower   Pulmonary asthma , COPD, Current Smoker,    breath sounds clear to auscultation       Cardiovascular hypertension, Pt. on medications +CHF   Rhythm:Regular Rate:Normal     Neuro/Psych  Headaches, PSYCHIATRIC DISORDERS Depression  Neuromuscular disease CVA    GI/Hepatic Neg liver ROS, GERD  ,  Endo/Other  diabetes, Type 2  Renal/GU Renal disease     Musculoskeletal  (+) Arthritis ,   Abdominal   Peds  Hematology  (+) anemia ,   Anesthesia Other Findings   Reproductive/Obstetrics                             Lab Results  Component Value Date   WBC 6.3 11/07/2017   HGB 6.0 (LL) 11/07/2017   HCT 20.1 (L) 11/07/2017   MCV 98.5 11/07/2017   PLT 128 (L) 11/07/2017   Lab Results  Component Value Date   CREATININE 0.98 11/07/2017   BUN 45 (H) 11/07/2017   NA 144 11/07/2017   K 4.3 11/07/2017   CL 118 (H) 11/07/2017   CO2 20 (L) 11/07/2017    Anesthesia Physical  Anesthesia Plan  ASA: III  Anesthesia Plan: MAC   Post-op Pain Management:    Induction:   PONV Risk Score and Plan: 2 and Ondansetron and Propofol infusion  Airway Management Planned: Natural Airway and Nasal Cannula  Additional Equipment:   Intra-op Plan:   Post-operative Plan:   Informed Consent: I have reviewed the patients History and Physical, chart, labs and discussed the procedure including the risks, benefits and alternatives for the proposed anesthesia with the patient or authorized representative who has indicated his/her understanding and acceptance.   Dental advisory given  Plan Discussed with: CRNA  Anesthesia Plan  Comments:        Anesthesia Quick Evaluation

## 2017-11-07 NOTE — Anesthesia Postprocedure Evaluation (Signed)
Anesthesia Post Note  Patient: [name]  Procedure(s) Performed: ESOPHAGOGASTRODUODENOSCOPY (EGD) WITH PROPOFOL (N/A ) BIOPSY     Patient location during evaluation: Endoscopy Anesthesia Type: MAC Level of consciousness: awake and alert Pain management: pain level controlled Vital Signs Assessment: post-procedure vital signs reviewed and stable Respiratory status: spontaneous breathing, nonlabored ventilation, respiratory function stable and patient connected to nasal cannula oxygen Cardiovascular status: stable and blood pressure returned to baseline Postop Assessment: no apparent nausea or vomiting Anesthetic complications: no    Last Vitals:  Vitals:   11/07/17 1542 11/07/17 1550  BP: (!) 102/40 (!) 104/41  Pulse: 74 78  Resp: 20 18  Temp: 37.2 C   SpO2: 99% 100%    Last Pain:  Vitals:   11/07/17 1740  TempSrc:   PainSc: 2                  Evalie Hargraves COKER

## 2017-11-07 NOTE — Consult Note (Addendum)
[facility] Consult: 8:24 AM 11/07/2017  LOS: 0 days    Referring Provider: Dr Evette Doffing  Primary Care Physician:  Lars Mage, MD Primary Gastroenterologist:  Dr. Hilarie Fredrickson     Reason for Consultation:  Anemia.     HPI: [name] is a 67 y.o. female.  Hx TIA, on 81 ASA.  COPD.  DM 2.  HTN.  Fatty liver on CT of 06/2014.  Ureteral stones.  Sepsis of undetermined origin 05/2018.  Deg disc dz, spinal stenosis.  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  S/p cholecystectomy.  Elevated LFTs in setting of sepsis of undetermined cause in 05/2017, US showed diffusely fatty liver, bidirectional PV flow, GB absent.    03/2017 Colonoscopy screening study.  Dr Hilarie Fredrickson.  8 mm cecal polyp (sessile serrated), small int rrhoids.   1 week of progressive weakness, positional dizziness.  Then syncopal yest AM.  In ED anemic. ROS + for 10 d of heartburn, controlled this by increasing rare pantoprazole usage to daily.  Increased belching.  Decreased po and appetite, early satiety.  Stable weight.  Though weight down from 220s to 180s in last 6 years.  No ETOH other than a wine cooler a few times per year at most.  No abd pain, no dysphagia.  No easy bruising or bleeding.  No hx blood transfusion.  May have taken po iron as a younger woman.  Home meds include the daily 81 mg aspirin, Mobic.  Until 10 days ago she only took the Protonix at most a few times a month but as of 10 days ago she is been using 20 mg daily.  Hgb 6, c/w 10.8 on 06/16/17, 10.8 on 06/16/17.  MCV 98. Platelets 128.  BUN 45, normal creatinine.  Glucose 183. Coags normal.  ALT 81, O/w normal LFTs.   FOBT + CT head atraumatic.  Aortic and branch vessel atherosclerosis on CXR 1 U PRBCs ordered.     fm hx neg for PUD, anemia, GIB, liver disease, colon cancer.      Past  Medical History:  Diagnosis Date  . Allergy   . Anemia   . Arthritis    "left knee" (09/08/2015)  . Asthma   . Brachial plexus disorders   . CHF (congestive heart failure) ([place])   . Chronic pain    neck pain, headache, neuropathy  . COPD (chronic obstructive pulmonary disease) ([place])    SEES ONLY DR. PATEL   . Depression    "when my husband passed in 2013"  . GERD (gastroesophageal reflux disease)   . Hypertension   . Hypertriglyceridemia   . Migraine    "monthly" (09/08/2015)  . Neuromuscular disorder (H[facility])    neuropathy  . Puncture wound of foot, right 05/17/2012   Tetanus shot 3 yrs ago at [facility] in [place], per pt report   . Stroke ([place])    TIAS    IN [place]   4 YRS AGO   . Type II diabetes mellitus ([place]) 2010   diagnosed around 2010, only ever on metformin    Past Surgical  History:  Procedure Laterality Date  . AMPUTATION Right 05/01/2015   Procedure: Right Foot 1st Ray Amputation;  Surgeon: Newt Minion, MD;  Location: [facility];  Service: Orthopedics;  Laterality: Right;  . ARTHRODESIS METATARSAL     RIGHT 5 TH   . BILATERAL SALPINGOOPHORECTOMY Bilateral 1986   "after hemtoma evacuations; had to cut me open"  . CARPAL TUNNEL RELEASE Bilateral   . COLONOSCOPY     10 + yrs ago- pt unsure- was in [place]- pt states  MD will not send records but colon was normal per pt.   Marland Kitchen DILATION AND CURETTAGE OF UTERUS  ~ 1982   S/P miscarriage  . HEMATOMA EVACUATION Right 1986 x 2   "OVARY; w/in 1 wk after hysterectomy"  . KNEE ARTHROSCOPY     LEFT  . LAPAROSCOPIC CHOLECYSTECTOMY    . SHOULDER ARTHROSCOPY W/ ROTATOR CUFF REPAIR Bilateral   . TONSILLECTOMY    . TOTAL KNEE ARTHROPLASTY Left 09/30/2016   Procedure: LEFT TOTAL KNEE ARTHROPLASTY;  Surgeon: Netta Cedars, MD;  Location: [facility];  Service: Orthopedics;  Laterality: Left;  . TUBAL LIGATION    . VAGINAL HYSTERECTOMY  1986    Prior to Admission medications   Medication Sig Start Date End Date Taking?  Authorizing Provider  albuterol (PROVENTIL HFA;VENTOLIN HFA) 108 (90 Base) MCG/ACT inhaler Inhale 1-2 puffs into the lungs every 6 (six) hours as needed for wheezing or shortness of breath. 02/10/17  Yes Lucious Groves, DO  amLODipine (NORVASC) 5 MG tablet Take 1 tablet (5 mg total) by mouth daily. 09/08/17 11/06/17 Yes Chundi, Vahini, MD  aspirin 81 MG tablet Take 81 mg by mouth every morning.    Yes [provider]  aspirin-acetaminophen-caffeine (EXCEDRIN MIGRAINE) [phone] MG tablet Take 2 tablets by mouth every 8 (eight) hours as needed for headache.    Yes [provider]  cephALEXin (KEFLEX) 500 MG capsule Take 1 capsule (500 mg total) by mouth 2 (two) times daily. Patient taking differently: Take 500 mg by mouth 2 (two) times daily. To be completed on 11/07/17 11/02/17  Yes Trula Slade, DPM  fluticasone [facility]) 50 MCG/ACT nasal spray Place 1 spray into both nostrils as needed for allergies or rhinitis.    Yes [provider]  gabapentin (NEURONTIN) 400 MG capsule Take 1 capsule (400 mg total) by mouth 3 (three) times daily. 09/11/17 12/10/17 Yes Chundi, Vahini, MD  glipiZIDE (GLUCOTROL XL) 5 MG 24 hr tablet Take 1 tablet (5 mg total) by mouth daily with breakfast. Patient taking differently: Take 5 mg by mouth at bedtime.  02/10/17  Yes Lucious Groves, DO  lisinopril (PRINIVIL,ZESTRIL) 10 MG tablet Take 1 tablet (10 mg total) by mouth daily. 10/12/17 01/10/18 Yes Chundi, Vahini, MD  meloxicam (MOBIC) 7.5 MG tablet TAKE 1 TABLET(7.5 MG) BY MOUTH DAILY 10/10/17  Yes Chundi, Vahini, MD  pantoprazole (PROTONIX) 20 MG tablet Take 1 tablet (20 mg total) by mouth daily as needed. Patient taking differently: Take 20 mg by mouth daily as needed for heartburn or indigestion.  02/10/17  Yes Lucious Groves, DO  rosuvastatin (CRESTOR) 40 MG tablet Take 1 tablet (40 mg total) by mouth daily. 02/08/17  Yes Bartholomew Crews, MD  sertraline (ZOLOFT) 100 MG tablet TAKE 1  TABLET(100 MG) BY MOUTH DAILY 10/17/17  Yes Chundi, Vahini, MD  sitaGLIPtin-metformin (JANUMET) 50-1000 MG tablet Take 1 tablet by mouth daily. 09/08/17 11/06/17 Yes Chundi, Vahini, MD  glucose blood (ONETOUCH VERIO) test strip Check  blood sugar 3x/day 09/08/17   Chundi, Verne Spurr, MD  loratadine (CLARITIN) 10 MG tablet Take 1 tablet (10 mg total) by mouth daily. Patient not taking: Reported on 11/06/2017 09/08/17 10/08/17  Lars Mage, MD  sitaGLIPtin (JANUVIA) 50 MG tablet Take 1 tablet (50 mg total) by mouth daily. Patient not taking: Reported on 11/06/2017 06/17/17   Alphonzo Grieve, MD  SUMAtriptan (IMITREX) 25 MG tablet Take 1 tablet (25mg ) only if you have a headache. May repeat in 2 hours if headache persists or recurs. Do not take if you do not have a headache. Patient not taking: Reported on 11/06/2017 06/10/17   Welford Roche, MD    Scheduled Meds: . gabapentin  400 mg Oral TID  . insulin aspart  0-15 Units Subcutaneous Q4H  . sertraline  100 mg Oral Daily   Infusions: . sodium chloride 125 mL/hr at 11/06/17 1834  . sodium chloride     PRN Meds: acetaminophen **OR** acetaminophen, ketorolac, pantoprazole   Allergies as of 11/06/2017 - Review Complete 11/06/2017  Allergen Reaction Noted  . Anesthesia s-i-60 Other (See Comments) 01/20/2017  . Flexeril [cyclobenzaprine] Other (See Comments) 08/12/2012  . Soma [carisoprodol] Itching and Rash 05/13/2012    Family History  Problem Relation Age of Onset  . Hyperlipidemia Mother   . Heart attack Father 22  . Hypertension Father   . Cancer Paternal Grandfather        Lung cancer  . Cancer Maternal Grandmother   . Heart attack Maternal Grandmother   . Colon cancer Neg Hx   . Colon polyps Neg Hx   . Esophageal cancer Neg Hx   . Rectal cancer Neg Hx   . Stomach cancer Neg Hx     Social History   Socioeconomic History  . Marital status: Widowed    Spouse name: Not on file  . Number of children: Not on file  . Years of  education: 64  . Highest education level: Not on file  Occupational History  . Occupation: unemployed  Social Needs  . Financial resource strain: Not on file  . Food insecurity:    Worry: Not on file    Inability: Not on file  . Transportation needs:    Medical: Not on file    Non-medical: Not on file  Tobacco Use  . Smoking status: Current Some Day Smoker    Packs/day: 0.10    Years: 47.00    Pack years: 4.70    Types: Cigarettes    Last attempt to quit: 08/11/2016    Years since quitting: 1.2  . Smokeless tobacco: Never Used  . Tobacco comment:  2 cigs per day  Substance and Sexual Activity  . Alcohol use: No    Alcohol/week: 0.0 oz  . Drug use: No  . Sexual activity: Not Currently  Lifestyle  . Physical activity:    Days per week: Not on file    Minutes per session: Not on file  . Stress: Not on file  Relationships  . Social connections:    Talks on phone: Not on file    Gets together: Not on file    Attends religious service: Not on file    Active member of club or organization: Not on file    Attends meetings of clubs or organizations: Not on file    Relationship status: Not on file  . Intimate partner violence:    Fear of current or ex partner: Not on file    Emotionally abused: Not on file  Physically abused: Not on file    Forced sexual activity: Not on file  Other Topics Concern  . Not on file  Social History Narrative   Moved to [facility] from [place], shortly after the death of her husband, to live with her daughter.  She notes significant life stress related to her 48 year-old grand-daughter with bipolar disorder.  She has previously worked as a Occupational psychologist.    REVIEW OF SYSTEMS: Constitutional:  Weakness.   ENT:  No nose bleeds Pulm:  No SOB or cough.   CV:  No palpitations, no LE edema.  GU:  No hematuria, no frequency GI:  Per HPI Heme:  Per HPI   Transfusions:  Per HPI Neuro:  No headaches, no peripheral tingling or  numbness Derm:  No itching, no rash or sores.  Endocrine:  No sweats or chills.  No polyuria or dysuria Immunization:  No flu or pnemovax, doesn't believe in these.   Travel:  None beyond local counties in last few months.    PHYSICAL EXAM: Vital signs in last 24 hours: Vitals:   11/06/17 2052 11/07/17 0453  BP: 119/68 111/72  Pulse: 90 (!) 103  Resp: 16 16  Temp: 98.4 F (36.9 C) 98.5 F (36.9 C)  SpO2: 100% 99%   Wt Readings from Last 3 Encounters:  11/06/17 183 lb 10.3 oz (83.3 kg)  09/08/17 186 lb 8 oz (84.6 kg)  07/21/17 183 lb 14.4 oz (83.4 kg)    General: Patient looks moderately chronically and acutely ill.  Comfortable.  Alert. Head: Facial asymmetry or swelling.  No signs of head trauma. Eyes: No scleral icterus.  Conjunctiva is pale.  EOMI. Ears: Not hard of hearing. Nose: No congestion, no discharge. Mouth: Full upper denture.  Many of her lower teeth are absent but there is no appliance in the lower jaw. Neck: No mass, no JVD, no thyromegaly. Lungs: Clear bilaterally, somewhat reduced breath sounds.  No labored breathing or cough. Heart: RRR.  No MRG.  S1, S2 present. Abdomen: Soft, moderately obese.  No HSM, hernias, bruits, masses.  No tenderness..   Rectal: Did not repeat rectal exam.  Stool FOBT positive yesterday.  Stool passed just before I arrived in the room was described by nurse tech as greenish, black, soft, formed. Musc/Skeltl: No joint redness or swelling.  No significant joint deformities other than toe amputation. Extremities: Ray amputation at right fifth metatarsal.  No pedal or lower extremity edema. Neurologic: No tremor.  Fully alert and oriented.  Moves all 4 limbs without weakness.  No gross neurologic deficits. Skin: Sun exposure changes on the face and arms.  No suspicious lesions.  No rash or sores. Tattoos: None observed. Nodes: No cervical adenopathy. Psych: Pleasant, calm, cooperative.  Intake/Output from previous day: 05/27 0701 -  05/28 0700 In: 2387.5 [I.V.:1387.5; IV Piggyback:1000] Out: 800 [Urine:800] Intake/Output this shift: Total I/O In: -  Out: 350 [Urine:350]  LAB RESULTS: Recent Labs    11/06/17 1340 11/07/17 0548  WBC 8.2 6.3  HGB 8.7* 6.0*  HCT 28.5* 20.1*  PLT 165 128*   BMET Lab Results  Component Value Date   NA 144 11/07/2017   NA 141 11/06/2017   NA 137 07/10/2017   K 4.3 11/07/2017   K 4.2 11/06/2017   K 4.1 07/10/2017   CL 118 (H) 11/07/2017   CL 112 (H) 11/06/2017   CL 107 07/10/2017   CO2 20 (L) 11/07/2017   CO2 18 (L) 11/06/2017  CO2 20 (L) 07/10/2017   GLUCOSE 183 (H) 11/07/2017   GLUCOSE 125 (H) 11/06/2017   GLUCOSE 165 (H) 07/10/2017   BUN 45 (H) 11/07/2017   BUN 42 (H) 11/06/2017   BUN 18 07/10/2017   CREATININE 0.98 11/07/2017   CREATININE 1.27 (H) 11/06/2017   CREATININE 1.36 (H) 07/10/2017   CALCIUM 8.4 (L) 11/07/2017   CALCIUM 9.1 11/06/2017   CALCIUM 9.6 07/10/2017   LFT Recent Labs    11/06/17 1340  PROT 6.0*  ALBUMIN 3.3*  AST 28  ALT 81*  ALKPHOS 50  BILITOT 0.3   PT/INR Lab Results  Component Value Date   INR 1.06 06/05/2017   INR 0.95 09/28/2013   INR 0.92 03/09/2013   Hepatitis Panel No results for input(s): HEPBSAG, HCVAB, HEPAIGM, HEPBIGM in the last 72 hours. C-Diff No components found for: CDIFF Lipase     Component Value Date/Time   LIPASE 38 09/08/2015 2208    Drugs of Abuse     Component Value Date/Time   LABOPIA NONE DETECTED 03/09/2013 2346   COCAINSCRNUR NONE DETECTED 03/09/2013 2346   LABBENZ NONE DETECTED 03/09/2013 2346   AMPHETMU NONE DETECTED 03/09/2013 2346   THCU NONE DETECTED 03/09/2013 2346   LABBARB NONE DETECTED 03/09/2013 2346     RADIOLOGY STUDIES: Ct Head Wo Contrast  Result Date: 11/06/2017 CLINICAL DATA:  Poly trauma, head injury EXAM: CT HEAD WITHOUT CONTRAST CT CERVICAL SPINE WITHOUT CONTRAST TECHNIQUE: Multidetector CT imaging of the head and cervical spine was performed following the  standard protocol without intravenous contrast. Multiplanar CT image reconstructions of the cervical spine were also generated. COMPARISON:  None. FINDINGS: CT HEAD FINDINGS Brain: No evidence of acute infarction, hemorrhage, extra-axial collection, ventriculomegaly, or mass effect. Generalized cerebral atrophy. Periventricular white matter low attenuation likely secondary to microangiopathy. Vascular: Cerebrovascular atherosclerotic calcifications are noted. Skull: Negative for fracture or focal lesion. Sinuses/Orbits: Visualized portions of the orbits are unremarkable. Right maxillary sinus mucous retention cyst. Small air-fluid level in the left maxillary sinus. Other: Left parietal scalp hematoma. CT CERVICAL SPINE FINDINGS Alignment: Normal. Skull base and vertebrae: No acute fracture. No primary bone lesion or focal pathologic process. Soft tissues and spinal canal: No prevertebral fluid or swelling. No visible canal hematoma. Disc levels: Degenerative disc disease with disc height loss at C4-5 with a mild broad-based disc osteophyte complex. Disc spaces are otherwise maintained. Upper chest: Lung apices are clear. Other: No fluid collection or hematoma. IMPRESSION: 1. No acute intracranial pathology. 2. No acute osseous injury of the cervical spine. Electronically Signed   By: Kathreen Devoid   On: 11/06/2017 14:41   Ct Cervical Spine Wo Contrast  Result Date: 11/06/2017 CLINICAL DATA:  Poly trauma, head injury EXAM: CT HEAD WITHOUT CONTRAST CT CERVICAL SPINE WITHOUT CONTRAST TECHNIQUE: Multidetector CT imaging of the head and cervical spine was performed following the standard protocol without intravenous contrast. Multiplanar CT image reconstructions of the cervical spine were also generated. COMPARISON:  None. FINDINGS: CT HEAD FINDINGS Brain: No evidence of acute infarction, hemorrhage, extra-axial collection, ventriculomegaly, or mass effect. Generalized cerebral atrophy. Periventricular white matter  low attenuation likely secondary to microangiopathy. Vascular: Cerebrovascular atherosclerotic calcifications are noted. Skull: Negative for fracture or focal lesion. Sinuses/Orbits: Visualized portions of the orbits are unremarkable. Right maxillary sinus mucous retention cyst. Small air-fluid level in the left maxillary sinus. Other: Left parietal scalp hematoma. CT CERVICAL SPINE FINDINGS Alignment: Normal. Skull base and vertebrae: No acute fracture. No primary bone lesion or focal pathologic process.  Soft tissues and spinal canal: No prevertebral fluid or swelling. No visible canal hematoma. Disc levels: Degenerative disc disease with disc height loss at C4-5 with a mild broad-based disc osteophyte complex. Disc spaces are otherwise maintained. Upper chest: Lung apices are clear. Other: No fluid collection or hematoma. IMPRESSION: 1. No acute intracranial pathology. 2. No acute osseous injury of the cervical spine. Electronically Signed   By: Kathreen Devoid   On: 11/06/2017 14:41   Dg Chest Port 1 View  Result Date: 11/06/2017 CLINICAL DATA:  Syncope. EXAM: PORTABLE CHEST 1 VIEW COMPARISON:  06/06/2017 FINDINGS: Mild right hemidiaphragm elevation. Patient rotated right. Midline trachea. Normal heart size. Atherosclerosis in the transverse aorta. No pleural effusion or pneumothorax. Clear lungs. IMPRESSION: No acute cardiopulmonary disease. Aortic and branch vessel atherosclerosis. Electronically Signed   By: Abigail Miyamoto M.D.   On: 11/06/2017 13:59     IMPRESSION:   *   Symptomatic, normocytic anemia.  Rule out peptic or NSAID induced ulcer.  Rule out esophageal or gastric varices or portal hypertensive gastropathy source of bleeding is patient's fatty liver could have evolved to cirrhosis.  *    Elevated LFTs, noted during admission with sepsis in 05/2017.  These have improved, currently only her ALT is moderately elevated.  Coags are fine.  Platelets slightly low.  Fatty liver noted as far back as  2016 and again noted on ultrasound in 05/2017.  ? Has she evolved to cirrhosis?   *   NIDDM.    PLAN:     *    EGD this afternoon.  1 U PRBCs to start this AM, should be completed by arrival to EGD.    *  Currently Protonix is only PRN, should be daily.  Will determine best dosing after EGD.    Azucena Freed  11/07/2017, 8:24 AM Phone [phone]  GI ATTENDING  History, laboratories, x-rays, prior endoscopy reports reviewed. Patient personally seen and examined. Agree with comprehensive consultation note as outlined above. Patient with multiple medical problems as outlined. Presents with what appears to be hemodynamically stable upper GI bleed. Plans for upper endoscopy with therapeutics as indicated, today.The nature of the procedure, as well as the risks, benefits, and alternatives were carefully and thoroughly reviewed with the patient. Ample time for discussion and questions allowed. The patient understood, was satisfied, and agreed to proceed.  Docia Chuck. Geri Seminole., M.D. [facility] Division of Gastroenterology

## 2017-11-07 NOTE — Transfer of Care (Signed)
Immediate Anesthesia Transfer of Care Note  Patient: [name]  Procedure(s) Performed: ESOPHAGOGASTRODUODENOSCOPY (EGD) WITH PROPOFOL (N/A ) BIOPSY  Patient Location: Endoscopy Unit  Anesthesia Type:MAC  Level of Consciousness: awake, alert  and oriented  Airway & Oxygen Therapy: Patient Spontanous Breathing and Patient connected to nasal cannula oxygen  Post-op Assessment: Report given to RN and Post -op Vital signs reviewed and stable  Post vital signs: Reviewed and stable  Last Vitals:  Vitals Value Taken Time  BP 102/40 11/07/2017  3:42 PM  Temp    Pulse 75 11/07/2017  3:44 PM  Resp 22 11/07/2017  3:44 PM  SpO2 99 % 11/07/2017  3:44 PM  Vitals shown include unvalidated device data.  Last Pain:  Vitals:   11/07/17 1542  TempSrc:   PainSc: 0-No pain         Complications: No apparent anesthesia complications

## 2017-11-08 ENCOUNTER — Other Ambulatory Visit: Payer: Self-pay | Admitting: Internal Medicine

## 2017-11-08 LAB — PROTIME-INR
INR: 1.1
Prothrombin Time: 14.1 seconds (ref 11.4–15.2)

## 2017-11-08 LAB — CBC
HCT: 20.2 % — ABNORMAL LOW (ref 36.0–46.0)
HCT: 22.6 % — ABNORMAL LOW (ref 36.0–46.0)
HEMOGLOBIN: 6.3 g/dL — AB (ref 12.0–15.0)
Hemoglobin: 7.2 g/dL — ABNORMAL LOW (ref 12.0–15.0)
MCH: 29.3 pg (ref 26.0–34.0)
MCH: 28.9 pg (ref 26.0–34.0)
MCHC: 31.2 g/dL (ref 30.0–36.0)
MCHC: 31.9 g/dL (ref 30.0–36.0)
MCV: 94 fL (ref 78.0–100.0)
MCV: 90.8 fL (ref 78.0–100.0)
PLATELETS: 113 10*3/uL — AB (ref 150–400)
Platelets: 117 10*3/uL — ABNORMAL LOW (ref 150–400)
RBC: 2.15 MIL/uL — AB (ref 3.87–5.11)
RBC: 2.49 MIL/uL — ABNORMAL LOW (ref 3.87–5.11)
RDW: 19 % — ABNORMAL HIGH (ref 11.5–15.5)
RDW: 19 % — AB (ref 11.5–15.5)
WBC: 5.6 10*3/uL (ref 4.0–10.5)
WBC: 6.6 10*3/uL (ref 4.0–10.5)

## 2017-11-08 LAB — HEMOGLOBIN AND HEMATOCRIT, BLOOD
HCT: 23.6 % — ABNORMAL LOW (ref 36.0–46.0)
Hemoglobin: 7.5 g/dL — ABNORMAL LOW (ref 12.0–15.0)

## 2017-11-08 LAB — GLUCOSE, CAPILLARY
GLUCOSE-CAPILLARY: 99 mg/dL (ref 65–99)
GLUCOSE-CAPILLARY: 129 mg/dL — AB (ref 65–99)
Glucose-Capillary: 286 mg/dL — ABNORMAL HIGH (ref 65–99)
Glucose-Capillary: 72 mg/dL (ref 65–99)

## 2017-11-08 LAB — PREPARE RBC (CROSSMATCH)

## 2017-11-08 LAB — CLOTEST (H. PYLORI), BIOPSY: Helicobacter screen: NEGATIVE

## 2017-11-08 LAB — VITAMIN B12: VITAMIN B 12: 180 pg/mL (ref 180–914)

## 2017-11-08 LAB — SAVE SMEAR

## 2017-11-08 LAB — TECHNOLOGIST SMEAR REVIEW

## 2017-11-08 LAB — LACTATE DEHYDROGENASE: LDH: 120 U/L (ref 98–192)

## 2017-11-08 MED ORDER — SODIUM CHLORIDE 0.9 % IV SOLN
510.0000 mg | Freq: Once | INTRAVENOUS | Status: AC
  Administered 2017-11-08: 510 mg via INTRAVENOUS
  Filled 2017-11-08: qty 17

## 2017-11-08 MED ORDER — INSULIN ASPART 100 UNIT/ML ~~LOC~~ SOLN
0.0000 [IU] | Freq: Three times a day (TID) | SUBCUTANEOUS | Status: DC
  Administered 2017-11-08 – 2017-11-09 (×2): 8 [IU] via SUBCUTANEOUS
  Administered 2017-11-09: 2 [IU] via SUBCUTANEOUS
  Administered 2017-11-10: 3 [IU] via SUBCUTANEOUS
  Administered 2017-11-10: 2 [IU] via SUBCUTANEOUS

## 2017-11-08 MED ORDER — SODIUM CHLORIDE 0.9 % IV BOLUS
1000.0000 mL | Freq: Once | INTRAVENOUS | Status: AC
Start: 2017-11-08 — End: 2017-11-08
  Administered 2017-11-08: 1000 mL via INTRAVENOUS

## 2017-11-08 MED ORDER — SODIUM CHLORIDE 0.9 % IV SOLN
Freq: Once | INTRAVENOUS | Status: AC
  Administered 2017-11-08: 09:00:00 via INTRAVENOUS

## 2017-11-08 NOTE — Progress Notes (Addendum)
Subjective: This morning she is seen awake lying in bed. She endorses continued fatigue and some mild LLQ abdominal pain. She had one bowel movement yesterday which was non-bloody.  Overnight had some mildly low BP's in 100's/50's. She denies any nausea or vomiting.   Objective:  Vital signs in last 24 hours: Vitals:   11/08/17 0100 11/08/17 0529 11/08/17 0855 11/08/17 0916  BP:  (!) 104/55 (!) 115/98 128/60  Pulse:  75 82 91  Resp:  _0 Temp:  98.6 F (37 C) 98.1 F (36.7 C) 98.3 F (36.8 C)  TempSrc:  Oral Oral Oral  SpO2:  98% 100% 98%  Weight: 84 kg (185 lb 3 oz)     Height:       Weight change: 0.992 kg (2 lb 3 oz)  Intake/Output Summary (Last 24 hours) at 11/08/2017 1030 Last data filed at 11/08/2017 0906 Gross per 24 hour  Intake 4386.67 ml  Output 1900 ml  Net 2486.67 ml   BP 128/60   Pulse 91   Temp 98.3 F (36.8 C) (Oral)   Resp 17   Ht _1  (1.676 m)   Wt 84 kg (185 lb 3 oz)   SpO2 98%   BMI 29.89 kg/m     GEN: Lying in bed in NAD CV: NR & RR, no m/r/g, distant heart sounds PULM: Ronchorus breath sounds bilaterally. no wheezes or rales, no increased work of breathing on RA ABD: Normoactive bowel sounds. Obese, non-distended. Mild LLQ tenderness to deep palpation. No hepatosplenomegaly on percussion. MSK: No LE edema. 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 SKIN: Telangiectasias on bilateral cheeks.       Assessment/Plan:  [name] is a 67 year old female with PMH of HTN, T2DM, HFpEF, COPD, prior TIA who presented on 5/27 with lightheadedness and syncope. Workup concerning for symptomatic anemia. Upper endoscopy 5/28 identified 2 non bleeding gastric ulcers. She received IVFs and 1u of pRBC on admission.   Symptomatic anemia Hemoglobin dropped again this morning 6.3 from 7.9 after transfusion yesterday. She denies hematochezia, reports 1 BM yesterday. She underwent endoscopy yesterday that demonstrated 2 non bleeding gastric ulcers. Work up is  also notable for iron deficiency anemia, ferritin < 10. She endorses taking ibuprofen about every other day and Excedrin about once/week for tension-like headaches. Also on ASA 81 mg for secondary prevention in the setting of prior TIA. We have been holding her ASA and antihypertensives. Also on the differential is hemolytic anemia given the low platelets and drop in Hb to 6.3 this morning despite receiving 1u of pRBC yesterday, although this is less likely than upper GI bleed. Myelodysplastic syndrome is also considered on the differential but less likely given normal CBC differential but will obtain peripheral blood smear to further evaluate.  -Continue IV NS 125 cc/hr, add 1L of NS bolus for hypotension -Transfuse an additional 1u pRBC -IV feraheme x 1 today -F/u post-transfusion H&H -F/u serum LDH and haptoglobin -Obtain peripheral blood smear -F/u B12 level -Pantoprazole 34m daily -Change Excedrin and ibuprofen to Fioricet and tylenol, respectively, PRN for headaches -CBC and BMP in AM  Fatty liver Seen on RUQ U/S in 05/2017. ALT mildly elevated, albumin low, and platelets low on admission. Platelets remain low this morning at 113. PT/INR wnl. Upper endoscopy yesterday showed no evidence of esophageal varices.  - Continue to monitor for any acute clinical changes  Chronic proximal muscle weakness Unknown etiology. Endorses history of an abnormal nerve conduction study years  ago in [place]  -[place] (07/12/17), 428 (06/07/17), ESR wnl (07/12/17) - Holding home rosuvastatin - Saw rheumatology [facility]) last week and had labs drawn, will try to obtain records  HTN BP 104/55 this morning - Holding home amlodipine 48m daily and lisinopril 144mdaily 2/2 hypotension  T2DM Home regimen includes sitagliptin-metformin 50-100052maily and glipizde 5mg73mS, A1c 7.8 on 07/21/2017 - Hold home regimen while inpatient - SSI tid with meals - Heart healthy diet - CBG  monitoring  HFpEF TTE in 05/2017 shows LVEF 65-70% and grade 2 DD. Euvolemic on exam, although getting fluids and blood 2/2 anemia. Currently saturating well on RA. - Continue to monitor  Dispo: Anticipated discharge in approximately 1-2 day(s), pending correction of anemia    LOS: 1 day   LenzThornton Papasdical Student 11/08/2017, 10:30 AM Pager: [phone] have seen and examined the patient, and reviewed the daily progress note by [name] 4 and discussed the care of the patient with them. I have edited the note above and agree with the assessment and plan.   Signed:  GuilVelna Flowers 11/08/2017, 1:31 PM

## 2017-11-08 NOTE — Discharge Summary (Addendum)
Name: [name] MRN: [id number] DOB: [birth year] 67 y.o. PCP: Lars Mage, MD  Date of Admission: 11/06/2017  1:01 PM Date of Discharge: 11/10/2017 Attending Physician: Lalla Brothers, MD  Discharge Diagnosis: 1. Symptomatic anemia 2. Iron deficiency anemia 3. Peptic ulcer disease 4. B12 deficiency   Discharge Medications: Allergies as of 11/10/2017      Reactions   Anesthesia S-i-60 Other (See Comments)   Hallucinations.   Flexeril [cyclobenzaprine] Other (See Comments)   Prolonged QTc to 571, tachycardia   Soma [carisoprodol] Itching, Rash      Medication List    STOP taking these medications   aspirin-acetaminophen-caffeine 250-250-65 MG tablet Commonly known as:  EXCEDRIN MIGRAINE   cephALEXin 500 MG capsule Commonly known as:  KEFLEX   loratadine 10 MG tablet Commonly known as:  CLARITIN   meloxicam 7.5 MG tablet Commonly known as:  MOBIC   sitaGLIPtin 50 MG tablet Commonly known as:  JANUVIA   SUMAtriptan 25 MG tablet Commonly known as:  IMITREX     TAKE these medications   albuterol 108 (90 Base) MCG/ACT inhaler Commonly known as:  PROVENTIL HFA;VENTOLIN HFA Inhale 1-2 puffs into the lungs every 6 (six) hours as needed for wheezing or shortness of breath.   amLODipine 5 MG tablet Commonly known as:  NORVASC Take 1 tablet (5 mg total) by mouth daily.   aspirin 81 MG tablet Take 81 mg by mouth every morning.   cyanocobalamin 1000 MCG tablet Take 1 tablet (1,000 mcg total) by mouth daily.   ferrous sulfate 325 (65 FE) MG tablet Take 1 tablet (325 mg total) by mouth every other day.   fluticasone 50 MCG/ACT nasal spray Commonly known as:  FLONASE Place 1 spray into both nostrils as needed for allergies or rhinitis.   gabapentin 400 MG capsule Commonly known as:  NEURONTIN Take 1 capsule (400 mg total) by mouth 3 (three) times daily.   glipiZIDE 5 MG 24 hr tablet Commonly known as:  GLUCOTROL XL Take 1 tablet (5 mg total) by mouth  daily with breakfast. What changed:  when to take this   glucose blood test strip Commonly known as:  ONETOUCH VERIO Check blood sugar 3x/day   lisinopril 10 MG tablet Commonly known as:  PRINIVIL,ZESTRIL Take 1 tablet (10 mg total) by mouth daily.   pantoprazole 40 MG tablet Commonly known as:  PROTONIX Take 1 tablet (40 mg total) by mouth daily. What changed:    medication strength  how much to take  when to take this  reasons to take this   rosuvastatin 40 MG tablet Commonly known as:  CRESTOR Take 1 tablet (40 mg total) by mouth daily.   sertraline 100 MG tablet Commonly known as:  ZOLOFT TAKE 1 TABLET(100 MG) BY MOUTH DAILY   sitaGLIPtin-metformin 50-1000 MG tablet Commonly known as:  JANUMET Take 1 tablet by mouth daily.       Disposition and follow-up:   [name] was discharged from [facility] in Stable condition.  At the hospital follow up visit please address:  1.  Symptomatic anemia- 2/2 to iron deficiency anemia and peptic ulcer disease, as well as B12 deficiency. Patient required 3u of pRBCs transfusion, Hb was 9.0 on discharge. Patient was instructed to discontinue NSAIDs. Her home ASA 81 mg was continued for secondary prevention given prior TIA. She was given prescriptions for B12 and iron supplements. EGD was performed on 11/07/17 and revealed 2 nonbleeding gastric ulcers, biopsies were taken to rule  out H. Pylori, please f/u.  2.  Labs / imaging needed at time of follow-up: CBC  3.  Pending labs/ test needing follow-up: Biopsies from EGD   Follow-up Appointments: Follow-up Information    Levin Erp, PA Follow up on 11/24/2017.   Specialty:  Gastroenterology Why:  10 AM follow up of ulcer with PA at gastroenterologists office.   Contact information: [place] [address] [phone]        [place] Follow up.   Why:  home health PT Contact information: [id number] [place] [address] [place] by problem list:   1. Symptomatic anemia- Patient presented with 1 week of progressive lightheadedness and one episode of syncope during which she fell and hit the back of her head. On admission, BP was 84/50, Hb was 8.7, ferritin was low at 10, MCV was normal at 98.3. CT head and C-spine were negative for acute pathology. Troponin, lactate, AM cortisol were within normal limits. FOBT was positive in ED, although the patient denied visible blood in stool. Colonoscopy in 05/2017 showed an 37mm sessile polyp and internal hemorrhoids. On hospital day 1 following admission, her Hb dropped to 6.0. She subsequently got 1u of pRBCs was evaluated with upper endoscopy which showed 2 non-bleeding gastric ulcers. On hospital day 2 following admission, her Hb dropped to 6.3 (from 7.9 after transfusion) and she received one more unit of pRBC. She was worked up for hemolytic anemia and B12 deficiency. LDH, haptoglobin and peripheral blood smear were found to be within normal limits. B12 was borderline low at 180 pg/mL. She denies vegan diet.  She was given an additional unit of pRBC on hospital day 3 for persistent orthostatic hypotension, for a total of 3u pRBC during her stay. She was discharged home on Pantoprazole 40 mg qd, as well as B12 and Iron supplementation. She was instructed to discontinue all NSAIDs and to stop her PRN Excedrin. She was instructed to continue her ASA 81mg  qd for secondary prevention.  2. Iron deficiency anemia- On workup for the problem detailed above, MCV and ferritin were consistent with iron deficiency anemia. This is most likely chronic and secondary to the gastric ulcers found on upper endoscopy despite no visible blood in her bowel movements. She received a total of 3u of pRBC and IV iron repletion during her hospital stay. Recommend a repeat CBC on f/u after discharge.   3. Peptic ulcer disease- On  workup for the problems detailed above, upper endoscopy identified 2 nonbleeding gastric ulcers. Her risk factors include smoking (7 cigarrettes/day), Excedrin (which contains 250mg  aspirin and she takes once/week for headaches), ibuprofen (which she takes about every other day for headaches), and ASA 81mg . She states that she had a remote history of migraines as a young adult but now her headaches are more tension-like. As discussed above, she was instructed to stop taking Excedrin and switch her ibuprofen to Tylenol PRN for headaches. We discharged her on Pantoprazole 40mg  qd for her PUD. If symptoms return, pill endoscopy may be considered to evaluate for ulcers lower in the GI tract.   4. B12 deficiency- Was found to have a borderline low B12 level of 180 pg/mL which may contribute to the normocytic anemia when coupled with the iron deficiency anemia from upper GI bleed. Denies vegan diet and states that she eats plenty of meat. Pernicious anemia  is considered on the differential as a potential cause of her B12 deficiency. We discharged her with prescriptions for B12 and iron supplements.   Discharge Vitals:   BP 122/62 (BP Location: Right Arm)   Pulse 75   Temp 98.1 F (36.7 C) (Oral)   Resp 18   Ht 5\' 6"  (1.676 m)   Wt 82.3 kg (181 lb 8 oz)   SpO2 99%   BMI 29.29 kg/m   Pertinent Labs, Studies, and Procedures:   Upper endoscopy (11/07/17)-   Findings: The esophagus was normal. No varices. Two non-bleeding clean-based gastric ulcers with no stigmata of bleeding were found in the prepyloric region of the stomach. The largest lesion was 7 mm in largest dimension. Biopsies were taken with a cold forceps for Helicobacter pylori testing using CLOtest. The stomach was otherwise normal. The examined duodenum was normal. The cardia and gastric fundus were normal on retroflexion.  B12 level (11/08/17)- 180 pg/mL  Direct Coomb's test (11/09/17)- Negative  LDH, Haptoglobin, LDH (11/08/17)-  Within normal limits  PT/INR (11/08/17)- PT 14.1 sec, INR 1.10    Discharge Instructions: Discharge Instructions    Call MD for:  difficulty breathing, headache or visual disturbances   Complete by:  As directed    Call MD for:  extreme fatigue   Complete by:  As directed    Call MD for:  persistant dizziness or light-headedness   Complete by:  As directed    Call MD for:  persistant nausea and vomiting   Complete by:  As directed    Call MD for:  severe uncontrolled pain   Complete by:  As directed    Diet - low sodium heart healthy   Complete by:  As directed    Discharge instructions   Complete by:  As directed    Ms [name],  You were found to have 2 gastric ulcers and iron deficiency anemia from this.  You also have a vitamin B12 deficiency.  We have prescribed you iron supplements and vitamin B12 for this.  We also increased your Pantoprazole to 40mg  daily to help the ulcers heal.  While healing, please stop taking your Mobic and Excedrin Migraine.  Also, please do not take any other over the counter NSAIDs such as Aleve or Ibuprofen.  Please follow up with Dr Maricela Bo as scheduled in June.  Take Care.   Increase activity slowly   Complete by:  As directed       Signed: Thornton Papas, Medical Student 11/10/2017, 6:46 PM   Pager: [phone]   Attestation for Student Documentation:  I personally was present and performed or re-performed the history, physical exam and medical decision-making activities of this service and have verified that the service and findings are accurately documented in the student's discharge summary.  Margarito Dehaas, DO 11/11/2017, 1:11 PM

## 2017-11-09 LAB — GLUCOSE, CAPILLARY
GLUCOSE-CAPILLARY: 89 mg/dL (ref 65–99)
GLUCOSE-CAPILLARY: 134 mg/dL — AB (ref 65–99)
Glucose-Capillary: 122 mg/dL — ABNORMAL HIGH (ref 65–99)
Glucose-Capillary: 294 mg/dL — ABNORMAL HIGH (ref 65–99)

## 2017-11-09 LAB — CBC
HCT: 24.8 % — ABNORMAL LOW (ref 36.0–46.0)
HEMOGLOBIN: 8.1 g/dL — AB (ref 12.0–15.0)
MCH: 29.8 pg (ref 26.0–34.0)
MCHC: 32.7 g/dL (ref 30.0–36.0)
MCV: 91.2 fL (ref 78.0–100.0)
Platelets: 122 10*3/uL — ABNORMAL LOW (ref 150–400)
RBC: 2.72 MIL/uL — ABNORMAL LOW (ref 3.87–5.11)
RDW: 19.6 % — ABNORMAL HIGH (ref 11.5–15.5)
WBC: 6.7 10*3/uL (ref 4.0–10.5)

## 2017-11-09 LAB — BASIC METABOLIC PANEL
ANION GAP: 6 (ref 5–15)
BUN: 13 mg/dL (ref 6–20)
CO2: 21 mmol/L — AB (ref 22–32)
Calcium: 8.8 mg/dL — ABNORMAL LOW (ref 8.9–10.3)
Chloride: 116 mmol/L — ABNORMAL HIGH (ref 101–111)
Creatinine, Ser: 0.84 mg/dL (ref 0.44–1.00)
GFR calc Af Amer: >60 mL/min (ref >60)
GFR calc non Af Amer: >60 mL/min (ref >60)
GLUCOSE: 127 mg/dL — AB (ref 65–99)
POTASSIUM: 3.6 mmol/L (ref 3.5–5.1)
Sodium: 143 mmol/L (ref 135–145)

## 2017-11-09 LAB — URINE CULTURE

## 2017-11-09 LAB — HAPTOGLOBIN: HAPTOGLOBIN: 96 mg/dL (ref 34–200)

## 2017-11-09 LAB — DIRECT ANTIGLOBULIN TEST (NOT AT ARMC)
DAT, IGG: NEGATIVE
DAT, complement: NEGATIVE

## 2017-11-09 LAB — PREPARE RBC (CROSSMATCH)

## 2017-11-09 MED ORDER — PANTOPRAZOLE SODIUM 40 MG PO TBEC: 40.0000 mg | DELAYED_RELEASE_TABLET | Freq: Every day | ORAL | 0 refills | Status: DC

## 2017-11-09 MED ORDER — VITAMIN B-12 1000 MCG PO TABS
1000.0000 ug | ORAL_TABLET | Freq: Every day | ORAL | Status: DC
  Administered 2017-11-10: 1000 ug via ORAL
  Filled 2017-11-09: qty 1

## 2017-11-09 MED ORDER — FERROUS SULFATE 325 (65 FE) MG PO TABS
325.0000 mg | ORAL_TABLET | ORAL | Status: DC
  Administered 2017-11-10: 325 mg via ORAL
  Filled 2017-11-09: qty 1

## 2017-11-09 MED ORDER — SODIUM CHLORIDE 0.9 % IV SOLN
Freq: Once | INTRAVENOUS | Status: AC
  Administered 2017-11-09: 18:00:00 via INTRAVENOUS

## 2017-11-09 MED ORDER — CYANOCOBALAMIN 1000 MCG PO TABS: 1000.0000 ug | ORAL_TABLET | Freq: Every day | ORAL | 3 refills | Status: DC

## 2017-11-09 MED ORDER — CYANOCOBALAMIN 1000 MCG/ML IJ SOLN
1000.0000 ug | Freq: Once | INTRAMUSCULAR | Status: AC
  Administered 2017-11-09: 1000 ug via INTRAMUSCULAR
  Filled 2017-11-09: qty 1

## 2017-11-09 MED ORDER — FERROUS SULFATE 325 (65 FE) MG PO TABS: 325.0000 mg | ORAL_TABLET | ORAL | 3 refills | Status: DC

## 2017-11-09 NOTE — Progress Notes (Addendum)
Subjective:  This morning she is lying awake in bed. She endorses some improvement in her energy. She is getting up to go to the bathroom and sometimes still feels lightheaded upon standing. Has not seen PT yet. She had one non-bloody bowel movement yesterday. She denies any chest pain or shortness of breath.  Objective:  Vital signs in last 24 hours: Vitals:   11/08/17 1536 11/08/17 1950 11/09/17 0558 11/09/17 0805  BP: 119/60 (!) 120/92 (!) 118/59 (!) 116/59  Pulse: 81 92 77 72  Resp: _0 Temp: 98.6 F (37 C) 98.7 F (37.1 C) 98.2 F (36.8 C) 98.4 F (36.9 C)  TempSrc: Oral Oral Oral   SpO2: 98% 97% 99% 94%  Weight:  185 lb 3 oz (84 kg)    Height:       Weight change: 0 lb (0 kg)  Intake/Output Summary (Last 24 hours) at 11/09/2017 1338 Last data filed at 11/09/2017 0930 Gross per 24 hour  Intake 3287.92 ml  Output 3150 ml  Net 137.92 ml   GEN: Lying in bed in NAD CV: NR & RR, no m/r/g, distant heart sounds PULM: no wheezes, rales, or rhonchi. no increased work of breathing on RA ABD: Normoactive bowel sounds. Obese, non-distended. MSK: No LE edema. 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 SKIN: Telangiectasias on bilateral cheeks.  Assessment/Plan:  Active Problems:   Syncope   Symptomatic anemia   Pressure injury of skin   Acute gastric ulcer with hemorrhage  Ms. Imburgia is a 67 year old female with PMH of HTN, T2DM, HFpEF, COPD, prior TIA who presented on 5/27 with lightheadedness and syncope. Workup concerning for symptomatic anemia. Upper endoscopy 5/28 identified 2 non bleeding gastric ulcers. She received IVFs and 1u of pRBC on admission and an addition 1u pRBC on hospital day 1 for drop in Hb.   Symptomatic anemia Hemoglobin remained stable after transfusion yesterday, at 8.1 this morning. Now s/p a total of 2u pRBCs with IV iron repletion. She denies hematochezia, reports 1 BM yesterday. Upper endoscopy two days ago demonstrated 2 non bleeding gastric  ulcers. Work up is also notable for iron deficiency anemia, ferritin < 10. She endorses taking ibuprofen about every other day and Excedrin about once/week for tension-like headaches. Also on ASA 81 mg for secondary prevention in the setting of prior TIA. We have been holding her ASA and antihypertensives. Given drop in Hb after first transfusion, workup for hemolytic anemia was done. LDH, haptoglobin, and peripheral smear were within normal limits. Her B12 level came back borderline low at 180 pg/mL. Her normocytic anemia could be explained by a mixed picture of iron deficiency anemia 2/2 upper GI bleed and B12 deficiency possible 2/2 to pernicious anemia. -Discontinue IV fluids -Pantoprazole 68m daily -Change Excedrin and ibuprofen to Fioricet and tylenol, respectively, PRN for headaches -B12 supplementation -Fe supplementation   Fatty liver Seen on RUQ U/S in 05/2017. ALT mildly elevated, albumin low, and platelets low on admission. Platelets remain low this morning at 113. PT/INR wnl. Upper endoscopy yesterday showed no evidence of esophageal varices.  - Continue to monitor for any acute clinical changes  Chronic proximal muscle weakness Unknown etiology. Endorses [place] (06/07/17), ESR wnl (07/12/17) - Holding home rosuvastatin - [facility] week and had labs drawn, will try to obtain records  HTN BP 104/55 this morning - Holdinghome amlodipine 573mdailyand  lisinopril23m daily2/2 hypotension  T2DM Home regimen includes sitagliptin-metformin 50-100573mdaily and glipizde 73m373mHS, A1c 7.8 on 07/21/2017 - Hold home regimen while inpatient - SSI tid with meals - Heart healthy diet - CBG monitoring  HFpEF TTE in 05/2017 shows LVEF 65-70% and grade 2 DD. Euvolemic on exam, although getting fluidsand blood2/2 anemia. Currently saturating well on RA. -  Continue to monitor  Dispo: Anticipated discharge today.  LenThornton Papasedical Student 11/08/2017, 10:30 AM Pager: [phone] for Student Documentation:  I personally was present and performed or re-performed the history, physical exam and medical decision-making activities of this service and have verified that the service and findings are accurately documented in the student's note.  Still having some dizziness with standing, please check orthostatics. This may be symptomatic anemia that would be amenable to another blood transfusion. She has gotten about 1g of iron and starting B12 replacement today based on low serum at 180. No further GI bleeding noted. Anticipate discharge soon if we can get her dizziness improved.  VinAxel FillerD 11/09/2017, 3:33 PM

## 2017-11-09 NOTE — Evaluation (Signed)
Physical Therapy Evaluation Patient Details Name: [name] MRN: [id number] DOB: 1951/01/06 Today's Date: 11/09/2017   History of Present Illness  Pt is a 67 y/o female admitted secondary to syncopal episode, likely due to symptomatic anemia. Pt is s/p EGD on 5/28, which revealed 2 non bleeding ulcers. CT of head and c-spin negative for acute abnormality. PMH includes HTN, TIA, DM, COPD, CHF, and fatty liver.   Clinical Impression  Pt admitted secondary to problem above with deficits below. Mobility limited to chair this session as pt continues to have dizziness. Required min guard A for mobility this session without AD. Pt very adamant about wanting to go home at d/c. Anticipate pt will progress well once dizziness improved. Reports she will have assist from family at d/c. Will continue to follow acutely to maximize functional mobility independence and safety.     Follow Up Recommendations Home health PT;Supervision for mobility/OOB    Equipment Recommendations  None recommended by PT    Recommendations for Other Services       Precautions / Restrictions Precautions Precautions: Fall Restrictions Weight Bearing Restrictions: No      Mobility  Bed Mobility Overal bed mobility: Modified Independent                Transfers Overall transfer level: Needs assistance Equipment used: None Transfers: Sit to/from Stand;Stand Pivot Transfers Sit to Stand: Min guard Stand pivot transfers: Min guard       General transfer comment: Min guard for steadying assist. Pt continued to report dizziness in standing, however, wanted to transfer to chair. Pt talkative throughout transfer. Required min guard A for transfer to chair. Further mobility deferred secondary to dizziness.   Ambulation/Gait             General Gait Details: Deferred secondary to dizziness.   Stairs            Wheelchair Mobility    Modified Rankin (Stroke Patients Only)       Balance  Overall balance assessment: Needs assistance Sitting-balance support: No upper extremity supported;Feet supported Sitting balance-Leahy Scale: Good     Standing balance support: No upper extremity supported;During functional activity Standing balance-Leahy Scale: Fair                               Pertinent Vitals/Pain Pain Assessment: 0-10 Pain Score: 6  Pain Location: headache  Pain Descriptors / Indicators: Headache Pain Intervention(s): Limited activity within patient's tolerance;Monitored during session;Repositioned    Home Living Family/patient expects to be discharged to:: Private residence Living Arrangements: Children Available Help at Discharge: Family;Available 24 hours/day Type of Home: House Home Access: Stairs to enter Entrance Stairs-Rails: None(L side post ) Entrance Stairs-Number of Steps: 3 Home Layout: Two level;Able to live on main level with bedroom/bathroom Home Equipment: Gilford Rile - 2 wheels;Wheelchair - manual;Bedside commode      Prior Function Level of Independence: Independent               Hand Dominance   Dominant Hand: Left    Extremity/Trunk Assessment   Upper Extremity Assessment Upper Extremity Assessment: Defer to OT evaluation    Lower Extremity Assessment Lower Extremity Assessment: RLE deficits/detail;LLE deficits/detail;Generalized weakness RLE Sensation: history of peripheral neuropathy LLE Sensation: history of peripheral neuropathy    Cervical / Trunk Assessment Cervical / Trunk Assessment: Kyphotic  Communication   Communication: No difficulties  Cognition Arousal/Alertness: Awake/alert Behavior During Therapy: WFL for tasks assessed/performed  Overall Cognitive Status: Within Functional Limits for tasks assessed                                        General Comments      Exercises     Assessment/Plan    PT Assessment Patient needs continued PT services  PT Problem List  Cardiopulmonary status limiting activity;Decreased strength;Decreased balance;Decreased activity tolerance;Decreased mobility;Decreased knowledge of precautions       PT Treatment Interventions DME instruction;Gait training;Functional mobility training;Stair training;Therapeutic activities;Therapeutic exercise;Balance training;Patient/family education    PT Goals (Current goals can be found in the Care Plan section)  Acute Rehab PT Goals Patient Stated Goal: to go home to see my great grandson PT Goal Formulation: With patient Time For Goal Achievement: 11/23/17 Potential to Achieve Goals: Good    Frequency Min 3X/week   Barriers to discharge        Co-evaluation               AM-PAC PT "6 Clicks" Daily Activity  Outcome Measure Difficulty turning over in bed (including adjusting bedclothes, sheets and blankets)?: None Difficulty moving from lying on back to sitting on the side of the bed? : A Little Difficulty sitting down on and standing up from a chair with arms (e.g., wheelchair, bedside commode, etc,.)?: Unable Help needed moving to and from a bed to chair (including a wheelchair)?: A Little Help needed walking in hospital room?: A Lot Help needed climbing 3-5 steps with a railing? : A Lot 6 Click Score: 15    End of Session Equipment Utilized During Treatment: Gait belt Activity Tolerance: Treatment limited secondary to medical complications (Comment)(dizziness ) Patient left: in chair;with call bell/phone within reach Nurse Communication: Mobility status PT Visit Diagnosis: Difficulty in walking, not elsewhere classified (R26.2);Other abnormalities of gait and mobility (R26.89)    Time: 7517-0017 PT Time Calculation (min) (ACUTE ONLY): 25 min   Charges:   PT Evaluation $PT Eval Moderate Complexity: 1 Mod PT Treatments $Therapeutic Activity: 8-22 mins   PT G Codes:        Leighton Ruff, PT, DPT  Acute Rehabilitation Services  Pager:  [phone]   Rudean Hitt 11/09/2017, 1:49 PM

## 2017-11-09 NOTE — Evaluation (Signed)
Occupational Therapy Evaluation and Discharge Patient Details Name: [name] MRN: [id number] DOB: 1951-03-15 Today's Date: 11/09/2017    History of Present Illness Pt is a 67 y/o female admitted secondary to syncopal episode, likely due to symptomatic anemia. Pt is s/p EGD on 5/28, which revealed 2 non bleeding ulcers. CT of head and c-spin negative for acute abnormality. PMH includes HTN, TIA, DM, COPD, CHF, and fatty liver.    Clinical Impression   Pt is functioning at a supervision level in ADL and ADL transfers due to report of dizziness in standing. Pt has a supportive family who will provide close supervision with all mobility. Pt is agreeable to seated showering. Educated in how to set up 3 in 1 as shower seat. Pt to discharge today.     Follow Up Recommendations  No OT follow up    Equipment Recommendations  None recommended by OT    Recommendations for Other Services       Precautions / Restrictions Precautions Precautions: Fall Precaution Comments: dizziness upon standing Restrictions Weight Bearing Restrictions: No      Mobility Bed Mobility Overal bed mobility: Modified Independent             General bed mobility comments: in chair  Transfers Overall transfer level: Needs assistance Equipment used: None Transfers: Sit to/from Stand Sit to Stand: Supervision        General transfer comment: supervision for safety due to hx of dizziness    Balance Overall balance assessment: Needs assistance Sitting-balance support: No upper extremity supported;Feet supported Sitting balance-Leahy Scale: Good     Standing balance support: No upper extremity supported;During functional activity Standing balance-Leahy Scale: Fair                             ADL either performed or assessed with clinical judgement   ADL                                         General ADL Comments: functioning at a supervision level for  safety due to dizziness, recommended pt sit on 3 in 1 for showering and have close supervision with ambulation. Pt states a RW will not fit in her home.      Vision Patient Visual Report: No change from baseline       Perception     Praxis      Pertinent Vitals/Pain Pain Assessment: Faces Pain Score: 6  Faces Pain Scale: Hurts a little bit Pain Location: headache  Pain Descriptors / Indicators: Headache Pain Intervention(s): Monitored during session     Hand Dominance Left   Extremity/Trunk Assessment Upper Extremity Assessment Upper Extremity Assessment: Overall WFL for tasks assessed   Lower Extremity Assessment Lower Extremity Assessment: RLE deficits/detail;LLE deficits/detail;Generalized weakness RLE Sensation: history of peripheral neuropathy LLE Sensation: history of peripheral neuropathy   Cervical / Trunk Assessment Cervical / Trunk Assessment: Kyphotic   Communication Communication Communication: No difficulties   Cognition Arousal/Alertness: Awake/alert Behavior During Therapy: WFL for tasks assessed/performed Overall Cognitive Status: Within Functional Limits for tasks assessed                                     General Comments       Exercises     Shoulder  Instructions      Home Living Family/patient expects to be discharged to:: Private residence Living Arrangements: Children Available Help at Discharge: Family;Available 24 hours/day Type of Home: House Home Access: Stairs to enter [employer] of Steps: 3 Entrance Stairs-Rails: None(L side post ) Home Layout: Two level;Able to live on main level with bedroom/bathroom     Bathroom Shower/Tub: Teacher, early years/pre: Standard     Home Equipment: Environmental consultant - 2 wheels;Wheelchair - manual;Bedside commode          Prior Functioning/Environment Level of Independence: Independent                 OT Problem List:        OT  Treatment/Interventions:      OT Goals(Current goals can be found in the care plan section) Acute Rehab OT Goals Patient Stated Goal: to go home to see my great grandson  OT Frequency:     Barriers to D/C:            Co-evaluation              AM-PAC PT "6 Clicks" Daily Activity     Outcome Measure Help from another person eating meals?: None Help from another person taking care of personal grooming?: A Little Help from another person toileting, which includes using toliet, bedpan, or urinal?: A Little Help from another person bathing (including washing, rinsing, drying)?: A Little Help from another person to put on and taking off regular upper body clothing?: None Help from another person to put on and taking off regular lower body clothing?: A Little 6 Click Score: 20   End of Session Equipment Utilized During Treatment: Gait belt  Activity Tolerance: Patient tolerated treatment well Patient left: in chair;with call bell/phone within reach;with family/visitor present  OT Visit Diagnosis: Dizziness and giddiness (R42)                Time: 4132-4401 OT Time Calculation (min): 23 min Charges:  OT General Charges $OT Visit: 1 Visit OT Evaluation $OT Eval Low Complexity: 1 Low OT Treatments $Self Care/Home Management : 8-22 mins G-Codes:     11-21-2017 Nestor Lewandowsky, OTR/L Pager: [place], [name] 11/21/17, 2:52 PM

## 2017-11-09 NOTE — Care Management Note (Signed)
Case Management Note  Patient Details  Name: [name] MRN: [id number] Date of Birth: 09-Nov-1950  Subjective/Objective:                    Action/Plan:   Expected Discharge Date:  11/09/17               Expected Discharge Plan:  [place]  In-House Referral:  NA  Discharge planning Services  CM Consult  Post Acute Care Choice:  Home Health Choice offered to:  Patient  DME Arranged:  N/A DME Agency:  NA  HH Arranged:  PT [place] Agency:  [facility]  Status of Service:  Completed, signed off  If discussed at [place] of Stay Meetings, dates discussed:    Additional Comments:  Marilu Favre, RN 11/09/2017, 3:17 PM

## 2017-11-10 DIAGNOSIS — K279 Peptic ulcer, site unspecified, unspecified as acute or chronic, without hemorrhage or perforation: Secondary | ICD-10-CM

## 2017-11-10 DIAGNOSIS — E538 Deficiency of other specified B group vitamins: Secondary | ICD-10-CM

## 2017-11-10 DIAGNOSIS — D509 Iron deficiency anemia, unspecified: Secondary | ICD-10-CM

## 2017-11-10 LAB — GLUCOSE, CAPILLARY
GLUCOSE-CAPILLARY: 186 mg/dL — AB (ref 65–99)
Glucose-Capillary: 147 mg/dL — ABNORMAL HIGH (ref 65–99)

## 2017-11-10 LAB — TYPE AND SCREEN
ABO/RH(D): A POS
Antibody Screen: NEGATIVE
Unit division: 0
Unit division: 0
Unit division: 0

## 2017-11-10 LAB — CBC
HEMATOCRIT: 27.9 % — AB (ref 36.0–46.0)
Hemoglobin: 9 g/dL — ABNORMAL LOW (ref 12.0–15.0)
MCH: 29.2 pg (ref 26.0–34.0)
MCHC: 32.3 g/dL (ref 30.0–36.0)
MCV: 90.6 fL (ref 78.0–100.0)
Platelets: 120 10*3/uL — ABNORMAL LOW (ref 150–400)
RBC: 3.08 MIL/uL — ABNORMAL LOW (ref 3.87–5.11)
RDW: 18 % — AB (ref 11.5–15.5)
WBC: 5.6 10*3/uL (ref 4.0–10.5)

## 2017-11-10 LAB — BPAM RBC
BLOOD PRODUCT EXPIRATION DATE: 201906132359
Blood Product Expiration Date: 201906032359
Blood Product Expiration Date: 201906142359
ISSUE DATE / TIME: 201905281103
ISSUE DATE / TIME: 201905290900
ISSUE DATE / TIME: 201905301716
Unit Type and Rh: 6200
Unit Type and Rh: 6200
Unit Type and Rh: 6200

## 2017-11-10 NOTE — Discharge Instructions (Signed)
Follow up with primary care physician regarding your continued blood pressure management.

## 2017-11-10 NOTE — Progress Notes (Addendum)
Subjective:  [name] was lying comfortably in bed this morning. She reports improvement in her energy level since receiving the additional unit of blood yesterday. She denies any shortness of breath, chest pain, or lightheadedness. She was able to move from bed to chair without difficulty. She is tolerating PO without difficulty.   Objective:  Vital signs in last 24 hours: Vitals:   11/09/17 2049 11/09/17 2218 11/10/17 0430 11/10/17 0926  BP: 117/80  137/68 122/62  Pulse: 73  63 75  Resp: 20  20 18   Temp: 98.7 F (37.1 C)  97.9 F (36.6 C) 98.1 F (36.7 C)  TempSrc: Oral  Oral Oral  SpO2: 95%  96% 99%  Weight:  82.3 kg (181 lb 8 oz)    Height:       Weight change: -1.672 kg (-3 lb 11 oz)  Intake/Output Summary (Last 24 hours) at 11/10/2017 1255 Last data filed at 11/10/2017 1026 Gross per 24 hour  Intake 720 ml  Output 350 ml  Net 370 ml    GEN: Lying in bed in NAD CV: NR & RR, no m/r/g, distant heart sounds PULM:no wheezes, rales, or rhonchi. no increased work of breathing on RA TAV:[id number] bowel sounds. Obese, non-distended. MSK: No LE edema. 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 SKIN: Telangiectasias on bilateral cheeks.   Assessment/Plan:  Active Problems:   Syncope   Symptomatic anemia   Pressure injury of skin   Acute gastric ulcer with hemorrhage  [name] is a 67 year old female with PMH of HTN, T2DM, HFpEF, COPD, prior TIA who presented on 5/27 with lightheadedness and syncope. Workup was concerning forsymptomatic anemia 2/2 to upper GI bleed and B12 deficiency.She received a total of 3u of pRBC, IV Iron repletion, and B12 supplements during her hospital stay.  Symptomatic anemia Likely 2/2 to upper GI bleed and B12 deficiency. Hemoglobin remained stable after transfusion yesterday, at 9.0 this morning. Now s/p a total of 3u pRBCs with IV iron repletion and B12 supplements. She denies hematochezia or melena. Overall feeling much better, but  still with some fatigue; suspect will take several days for her energy to return to baseline. Able to ambulate about the room without issue; safe for dc with close outpatient follow-up. -Pantoprazole 16m daily -Change Excedrin and ibuprofen to Fioricet and tylenol, respectively, PRN for headaches -B12 supplementation -Fe supplementation -Outpatient follow-up scheduled for early next week  Fatty liver Seen on RUQ U/S in 05/2017. ALT mildly elevated, albumin low, and platelets lowon admission.  PT/INR wnl. Upper endoscopy showed no evidence of esophageal varices. Will monitor  Chronic proximal muscle weakness Unknown etiology. Endorses [place] 428 (06/07/17), ESR wnl (07/12/17) - Held home rosuvastatin during hospital stay, plan to restart upon discharge - [facility]  HTN BP's 110s-130s overnight. Held home BP meds during hospital stay given hypotension on admission -Plan to restart home amlodipine and lisinopril upon discharge  T2DM Home regimen includes sitagliptin-metformin 50-10077mdaily and glipizde 45m91mHS, A1c 7.8 on 07/21/2017 - SSItid with meals - Heart healthy diet - CBG monitoring - Resume home regimen upon discharge  HFpEF TTE in 05/2017 shows LVEF 65-70% and grade 2 DD. Euvolemic on exam, although getting fluidsand blood2/2 anemia. Currently saturating well on RA. - Continue to monitor  Dispo: Anticipated discharge today.    LOS: 3 days   LenThornton Papasedical Student  11/10/2017, 12:55 PM   Attestation for Student Documentation:  I personally was present and performed or re-performed the history, physical exam and medical decision-making activities of this service and have verified that the service and findings are accurately documented in the student's note.  Julia Alderman, DO 11/10/2017, 7:44 PM

## 2017-11-10 NOTE — Progress Notes (Addendum)
Discharge instructions (including medications) discussed with and copy provided to patient/caregiver. Patient verbalizes understanding of instructions, denies questions, monitor removed. Patient waiting for family to arrive to transport her home.

## 2017-11-10 NOTE — Progress Notes (Signed)
Paged and spoke with Gates Rigg, MS4 regarding discharge instructions. Patient off BP meds during hospitalization, clarified that if indeed would like BP meds resumed as ordered prior to hospitalization. Instructed to resume as ordered, follow up with primary care physician.

## 2017-11-10 NOTE — Progress Notes (Signed)
Physical Therapy Treatment Patient Details Name: [name] MRN: [id number] DOB: December 11, 1950 Today's Date: 11/10/2017    History of Present Illness Pt is a 67 y/o female admitted secondary to syncopal episode, likely due to symptomatic anemia. Pt is s/p EGD on 5/28, which revealed 2 non bleeding ulcers. CT of head and c-spine negative for acute abnormality. PMH includes HTN, TIA, DM, COPD, CHF, and fatty liver.     PT Comments    Patient with significant improvement noted from evaluation. No complaints of dizziness this session. Session focused on dynamic gait including head turns, stepping over/around obstacles, and adjusting gait speed in order to challenge balance. Patient requiring supervision for high level balance activities. Home health PT remains appropriate recommendation to maximize functional independence. Patient d/c home.   Follow Up Recommendations  Home health PT;Supervision for mobility/OOB     Equipment Recommendations  None recommended by PT    Recommendations for Other Services       Precautions / Restrictions Precautions Precautions: Fall Restrictions Weight Bearing Restrictions: No    Mobility  Bed Mobility               General bed mobility comments: in chair  Transfers Overall transfer level: Modified independent Equipment used: None Transfers: Sit to/from Stand Sit to Stand: Modified independent (Device/Increase time)            Ambulation/Gait Ambulation/Gait assistance: Supervision Ambulation Distance (Feet): 400 Feet Assistive device: None Gait Pattern/deviations: Step-through pattern;Decreased dorsiflexion - right;Decreased dorsiflexion - left     General Gait Details: Patient with mild unsteadiness requiring supervision but no overt LOB. Decreased bilateral heel strike at initial contact (baseline due to poor sensation 2/2 peripheral neuropathy). Decreased reciprocal arm swing. Able to negotiate around and over obstacles as  well as head turns without additional assistance.   Stairs             Wheelchair Mobility    Modified Rankin (Stroke Patients Only)       Balance Overall balance assessment: Needs assistance Sitting-balance support: No upper extremity supported;Feet supported Sitting balance-Leahy Scale: Normal     Standing balance support: No upper extremity supported;During functional activity Standing balance-Leahy Scale: Good                              Cognition Arousal/Alertness: Awake/alert Behavior During Therapy: WFL for tasks assessed/performed Overall Cognitive Status: Within Functional Limits for tasks assessed                                        Exercises Other Exercises Other Exercises: Dynamic gait for balance training including negotiating around obstacles, stepping over obstacles, head turns, and adjusting gait speed.    General Comments General comments (skin integrity, edema, etc.): Patient has poor sensation in feet secondary to peripheral neuropathy at baseline. Does not like to wear her diabetic shoes. Therefore, instructed patient on daily skin inspections on bottom of feet; patient verbalized understanding.      Pertinent Vitals/Pain Pain Assessment: No/denies pain    Home Living                      Prior Function            PT Goals (current goals can now be found in the care plan section) Acute Rehab PT Goals Patient Stated  Goal: to go home to see my great grandson PT Goal Formulation: With patient Time For Goal Achievement: 11/23/17 Potential to Achieve Goals: Good Progress towards PT goals: Progressing toward goals    Frequency    Min 3X/week      PT Plan Current plan remains appropriate    Co-evaluation              AM-PAC PT "6 Clicks" Daily Activity  Outcome Measure  Difficulty turning over in bed (including adjusting bedclothes, sheets and blankets)?: None Difficulty moving from  lying on back to sitting on the side of the bed? : None Difficulty sitting down on and standing up from a chair with arms (e.g., wheelchair, bedside commode, etc,.)?: None Help needed moving to and from a bed to chair (including a wheelchair)?: None Help needed walking in hospital room?: A Little Help needed climbing 3-5 steps with a railing? : A Little 6 Click Score: 22    End of Session Equipment Utilized During Treatment: Gait belt Activity Tolerance: Patient tolerated treatment well Patient left: in chair;with call bell/phone within reach Nurse Communication: Mobility status PT Visit Diagnosis: Difficulty in walking, not elsewhere classified (R26.2);Other abnormalities of gait and mobility (R26.89)     Time: 5784-6962 PT Time Calculation (min) (ACUTE ONLY): 12 min  Charges:  $Therapeutic Activity: 8-22 mins                    G Codes:      Ellamae Julia, PT, DPT Acute Rehabilitation Services  Pager: [place] 11/10/2017, 4:43 PM

## 2017-11-11 LAB — CULTURE, BLOOD (ROUTINE X 2)
Culture: NO GROWTH
Culture: NO GROWTH
Special Requests: ADEQUATE

## 2017-11-11 LAB — METHYLMALONIC ACID, SERUM: METHYLMALONIC ACID, QUANTITATIVE: 703 nmol/L — AB (ref 0–378)

## 2017-11-14 ENCOUNTER — Telehealth: Payer: Self-pay | Admitting: Dietician

## 2017-11-14 ENCOUNTER — Ambulatory Visit (INDEPENDENT_AMBULATORY_CARE_PROVIDER_SITE_OTHER): Payer: Medicare Other | Admitting: Internal Medicine

## 2017-11-14 VITALS — BP 102/54 | HR 94 | Temp 94.0°F | Ht 67.0 in | Wt 179.7 lb

## 2017-11-14 DIAGNOSIS — I509 Heart failure, unspecified: Secondary | ICD-10-CM

## 2017-11-14 DIAGNOSIS — I110 Hypertensive heart disease with heart failure: Secondary | ICD-10-CM | POA: Diagnosis not present

## 2017-11-14 DIAGNOSIS — K259 Gastric ulcer, unspecified as acute or chronic, without hemorrhage or perforation: Secondary | ICD-10-CM | POA: Diagnosis not present

## 2017-11-14 DIAGNOSIS — Z7982 Long term (current) use of aspirin: Secondary | ICD-10-CM | POA: Diagnosis not present

## 2017-11-14 DIAGNOSIS — Z79899 Other long term (current) drug therapy: Secondary | ICD-10-CM

## 2017-11-14 DIAGNOSIS — Z9889 Other specified postprocedural states: Secondary | ICD-10-CM

## 2017-11-14 DIAGNOSIS — D649 Anemia, unspecified: Secondary | ICD-10-CM | POA: Diagnosis not present

## 2017-11-14 DIAGNOSIS — E538 Deficiency of other specified B group vitamins: Secondary | ICD-10-CM | POA: Diagnosis not present

## 2017-11-14 DIAGNOSIS — Z9181 History of falling: Secondary | ICD-10-CM | POA: Diagnosis not present

## 2017-11-14 DIAGNOSIS — G44319 Acute post-traumatic headache, not intractable: Secondary | ICD-10-CM | POA: Diagnosis not present

## 2017-11-14 DIAGNOSIS — K760 Fatty (change of) liver, not elsewhere classified: Secondary | ICD-10-CM | POA: Diagnosis not present

## 2017-11-14 DIAGNOSIS — K250 Acute gastric ulcer with hemorrhage: Secondary | ICD-10-CM

## 2017-11-14 DIAGNOSIS — E611 Iron deficiency: Secondary | ICD-10-CM

## 2017-11-14 DIAGNOSIS — I5030 Unspecified diastolic (congestive) heart failure: Secondary | ICD-10-CM | POA: Diagnosis not present

## 2017-11-14 DIAGNOSIS — Z7984 Long term (current) use of oral hypoglycemic drugs: Secondary | ICD-10-CM | POA: Diagnosis not present

## 2017-11-14 DIAGNOSIS — E785 Hyperlipidemia, unspecified: Secondary | ICD-10-CM | POA: Diagnosis not present

## 2017-11-14 DIAGNOSIS — E1140 Type 2 diabetes mellitus with diabetic neuropathy, unspecified: Secondary | ICD-10-CM | POA: Diagnosis not present

## 2017-11-14 DIAGNOSIS — Z89421 Acquired absence of other right toe(s): Secondary | ICD-10-CM | POA: Diagnosis not present

## 2017-11-14 DIAGNOSIS — I10 Essential (primary) hypertension: Secondary | ICD-10-CM

## 2017-11-14 DIAGNOSIS — G44311 Acute post-traumatic headache, intractable: Secondary | ICD-10-CM

## 2017-11-14 DIAGNOSIS — F329 Major depressive disorder, single episode, unspecified: Secondary | ICD-10-CM

## 2017-11-14 DIAGNOSIS — Z7951 Long term (current) use of inhaled steroids: Secondary | ICD-10-CM | POA: Diagnosis not present

## 2017-11-14 DIAGNOSIS — E119 Type 2 diabetes mellitus without complications: Secondary | ICD-10-CM | POA: Diagnosis not present

## 2017-11-14 DIAGNOSIS — R55 Syncope and collapse: Secondary | ICD-10-CM | POA: Diagnosis not present

## 2017-11-14 DIAGNOSIS — Z8673 Personal history of transient ischemic attack (TIA), and cerebral infarction without residual deficits: Secondary | ICD-10-CM | POA: Diagnosis not present

## 2017-11-14 DIAGNOSIS — J449 Chronic obstructive pulmonary disease, unspecified: Secondary | ICD-10-CM | POA: Diagnosis not present

## 2017-11-14 DIAGNOSIS — K219 Gastro-esophageal reflux disease without esophagitis: Secondary | ICD-10-CM | POA: Diagnosis not present

## 2017-11-14 LAB — GLUCOSE, CAPILLARY: Glucose-Capillary: 108 mg/dL — ABNORMAL HIGH (ref 65–99)

## 2017-11-14 LAB — CBC
HCT: 29.8 % — ABNORMAL LOW (ref 36.0–46.0)
Hemoglobin: 9.2 g/dL — ABNORMAL LOW (ref 12.0–15.0)
MCH: 29.9 pg (ref 26.0–34.0)
MCHC: 30.9 g/dL (ref 30.0–36.0)
MCV: 96.8 fL (ref 78.0–100.0)
Platelets: 200 10*3/uL (ref 150–400)
RBC: 3.08 MIL/uL — ABNORMAL LOW (ref 3.87–5.11)
RDW: 19.8 % — ABNORMAL HIGH (ref 11.5–15.5)
WBC: 7.9 10*3/uL (ref 4.0–10.5)

## 2017-11-14 MED ORDER — TRAMADOL HCL 50 MG PO TABS: 50.0000 mg | ORAL_TABLET | Freq: Two times a day (BID) | ORAL | 0 refills | Status: AC | PRN

## 2017-11-14 NOTE — Assessment & Plan Note (Addendum)
Assessment Recent admission for symptomatic anemia thought to be secondary to iron deficiency and B12 deficiency. Endorses compliance with oral iron and B12, tolerating both. Since discharge, she continues to endorse episodes of lightheadedness. BP soft today at 102/54, patient has been holding her BP medications. Glucose normal. Repeated temperature was normal. Orthostatics negative. Patient has f/u with GI on 6/14 for further evaluation.  Plan - Check stat CBC today - Check orthostatics - Check TSH - Continue oral iron and oral B12 supplementation - Encouraged patient to f/u with outpatient GI and PCP on 6/14  ADDENDUM: Stat CBC shows stable Hb at 9.2 (9.0 on discharge). Orthostatics negative in clinic. TSH normal. Called patient to inform her of results. She endorsed understanding and will return in 10 days for follow-up.

## 2017-11-14 NOTE — Assessment & Plan Note (Signed)
Assessment Patient endorses posterior headache, likely post-traumatic after recent fall 2/2 syncope. CT head during admission was negative for intracranial bleed. Off of NSAIDs post-discharge due to anemia and concern for possible GI bleed. Requests something stronger than extra-strength tylenol for her headache as it is unrelieved by tylenol.  Plan - Provided 5 days of tramadol PRN for post-traumatic headache --- Advised patient of risks of tramadol including sedation and potentially exacerbating lightheadedness. She endorsed understanding and is willing to take this at night for headaches unrelieved by tylenol

## 2017-11-14 NOTE — Telephone Encounter (Signed)
I am okay with that Butch Penny. Thank you for doing this!  Vann Okerlund

## 2017-11-14 NOTE — Patient Instructions (Addendum)
FOLLOW-UP INSTRUCTIONS When: 11/24/2017 (appointment already scheduled) For: follow up of anemia and blood pressure What to bring: medications   [name],  It was nice to see you again.  Please continue to hold your blood pressure medicines - amlodipine and lisinopril. You will see you primary doctor on June 14, at which time we can follow up on whether to restart taking these.  We are checking some labs today. I will call you with the results.  For your headache, you can take tramadol 50mg  every 12 hours as needed. This medicine can make you sleepy and potentially make your dizziness worse, so it may be best to take it before you sleep at night. Do not drive or operate heavy machinery when you take this medicine.  If you have any questions or concerns, call our clinic at [phone] or after hours call [phone] and ask for the internal medicine resident on call.

## 2017-11-14 NOTE — Assessment & Plan Note (Signed)
Assessment BP 102/54 in the setting of anemia. Orthostatics negative. Patient has been holding amlodipine and lisinopril, will continue this as patient endorses 2 episodes of lightheadedness since discharge.  Plan - Continue holding amlodipine and lisinopril

## 2017-11-14 NOTE — Telephone Encounter (Signed)
No eye exam since 2017 at Dr. Zenia Resides office. She is a retinal camera candidate. If okay with Dr. Maricela Bo can ask if she would like to have those done here in our office on the same day as her upcoming appointment on 11/24/17.

## 2017-11-14 NOTE — Assessment & Plan Note (Signed)
Assessment EGD during recent admission for symptomatic anemia showed 2 nonbleeding gastric ulcers. H. Pylori test returned negative. Compliant with pantoprazole 40mg  daily  Plan - Patient to f/u with GI outpatient on 6/14 - Continue pantoprazole 40mg  daily

## 2017-11-14 NOTE — Progress Notes (Signed)
   CC: anemia  HPI:  [name] is a 67 y.o. female with PMH of CHF, diabetes, and depression who presents for follow up of anemia.  She was admitted 5/27-5/31 for symptomatic anemia secondary to combined iron deficiency and B12 deficiency, requiring 3 units of transfused red blood cells. She did have an EGD that showed 2 nonbleeding gastric ulcers, but no evidence of active or recent bleeding. H pylori negative. She also received IV iron and was discharged with oral iron and B12 supplementation. She was instructed to discontinue NSAIDs. She has been compliant with oral iron and B12 and has been tolerating these fine. Also taking pantoprazole 40mg  daily. She has also been holding her amlodipine and lisinopril since discharge.  Since discharge, she endorses 2 episodes of dizziness/lightheadedness that occurred after she had been walking/standing for a period of time. The lightheadedness resolved with sitting down. She has had 1 BM since discharge, which she states was very dark, however she is not sure whether this is because she is on the iron. No BRBPR. She denies abdominal pain or emesis.  Please see the assessment and plan below for the status of the patient's chronic medical problems.  Past Medical History:  Diagnosis Date  . Allergy   . Anemia   . Arthritis    "left knee" (09/08/2015)  . Asthma   . Brachial plexus disorders   . CHF (congestive heart failure) ([place])   . Chronic pain    neck pain, headache, neuropathy  . COPD (chronic obstructive pulmonary disease) ([place])    SEES ONLY DR. PATEL   . Depression    "when my husband passed in 2013"  . GERD (gastroesophageal reflux disease)   . Hypertension   . Hypertriglyceridemia   . Migraine    "monthly" (09/08/2015)  . Neuromuscular disorder ([facility])    neuropathy  . Puncture wound of foot, right 05/17/2012   Tetanus shot 3 yrs ago at [facility] in [place], per pt report   . Stroke ([place])    TIAS    IN [place]   4 YRS AGO     . Type II diabetes mellitus ([place]) 2010   diagnosed around 2010, only ever on metformin   Review of Systems:  GEN: Negative for fevers HEENT: Positive for headache CV: Negative for chest pain  PULM: Negative for SOB EXT: Negative for LE swelling NEURO: Positive for intermittent lightheadedness  Physical Exam:  Vitals:   11/14/17 1120  BP: (!) 102/54  Pulse: 94  Temp: (!) 94 F (34.4 C)  TempSrc: Oral  SpO2: 99%  Weight: 179 lb 11.2 oz (81.5 kg)  Height: 5\' 7"  (1.702 m)   Orthostatics performed in clinic were negative Repeat temperature was 98.11F Glucose 109  GEN: Sitting comfortably in chair in NAD CV: NR & RR, no m/r/g, no carotid bruits PULM: CTAB, no wheezes or rales ABD: Soft, NT, ND, decreased bowel sounds EXT: No LE edema. S/p R first toe amputation. NEURO: CN II-XII grossly intact. Moving all extremities spontaneously.  Assessment & Plan:   See Encounters Tab for problem based charting.  Patient discussed with Dr. Rebeca Alert

## 2017-11-15 LAB — TSH: TSH: 1.75 u[IU]/mL (ref 0.450–4.500)

## 2017-11-15 NOTE — Progress Notes (Signed)
Internal Medicine Clinic Attending  Case discussed with Dr. Huang  at the time of the visit.  We reviewed the resident's history and exam and pertinent patient test results.  I agree with the assessment, diagnosis, and plan of care documented in the resident's note.  Alexander N Raines, MD   

## 2017-11-16 ENCOUNTER — Encounter: Payer: Self-pay | Admitting: Podiatry

## 2017-11-16 ENCOUNTER — Ambulatory Visit (INDEPENDENT_AMBULATORY_CARE_PROVIDER_SITE_OTHER): Payer: Medicare Other | Admitting: Podiatry

## 2017-11-16 DIAGNOSIS — I248 Other forms of acute ischemic heart disease: Secondary | ICD-10-CM | POA: Diagnosis not present

## 2017-11-16 DIAGNOSIS — L97511 Non-pressure chronic ulcer of other part of right foot limited to breakdown of skin: Secondary | ICD-10-CM

## 2017-11-16 DIAGNOSIS — L84 Corns and callosities: Secondary | ICD-10-CM

## 2017-11-16 DIAGNOSIS — E1149 Type 2 diabetes mellitus with other diabetic neurological complication: Secondary | ICD-10-CM | POA: Diagnosis not present

## 2017-11-19 NOTE — Progress Notes (Signed)
Subjective: [name] presents the office today for follow-up evaluation of wounds to the toes of the right foot.  She states they are all well healed however her only concern is that pain to the callus on the right foot submetatarsal area.  She states the area hurts on a daily basis with pressure.  She continues to wear flip-flops and she does not wear diabetic shoes.  Denies any open sores. Denies any systemic complaints such as fevers, chills, nausea, vomiting. No acute changes since last appointment, and no other complaints at this time.   Objective: AAO x3, NAD DP/PT pulses palpable bilaterally, CRT less than 3 seconds The wound on the toes in the right foot are all well-healed.  There is a hyperkeratotic lesion right foot sub-metatarsal 5.  Upon review there is no underlying ulceration, drainage or any signs of infection.  The right second toe has done well since the surgery and there is no hyperkeratotic tissue or ulceration. No open lesions or pre-ulcerative lesions.  No pain with calf compression, swelling, warmth, erythema  Assessment: Resolved ulcerations right foot with pre-ulcerative hyperkeratotic lesion right submetatarsal 5  Plan: -All treatment options discussed with the patient including all alternatives, risks, complications.  -Wounds are all well-healed.  Continue to monitor for any recurrence.  I sharply debrided the hyperkeratotic lesion right foot submetatarsal 5 without any complications or bleeding.  Offloading all times.  Strongly encouraged to wear diabetic shoes with inserts to help offload the area but she continues to wear a flat shoe usually flip-flops or go barefoot.  Moisturizer to the area daily. -Patient encouraged to call the office with any questions, concerns, change in symptoms.   Trula Slade DPM

## 2017-11-20 ENCOUNTER — Telehealth: Payer: Self-pay | Admitting: Internal Medicine

## 2017-11-20 ENCOUNTER — Encounter: Payer: Self-pay | Admitting: Internal Medicine

## 2017-11-20 NOTE — Telephone Encounter (Signed)
Please call [place] back-  VO request for 1wk1, 2wk1, and 1wk2.   Per PT a message can be left on their VM.

## 2017-11-20 NOTE — Telephone Encounter (Signed)
VO given for strengthening, balance, gait and safety, do you agree?

## 2017-11-21 DIAGNOSIS — I5030 Unspecified diastolic (congestive) heart failure: Secondary | ICD-10-CM | POA: Diagnosis not present

## 2017-11-21 DIAGNOSIS — I110 Hypertensive heart disease with heart failure: Secondary | ICD-10-CM | POA: Diagnosis not present

## 2017-11-21 DIAGNOSIS — E1140 Type 2 diabetes mellitus with diabetic neuropathy, unspecified: Secondary | ICD-10-CM | POA: Diagnosis not present

## 2017-11-21 DIAGNOSIS — R55 Syncope and collapse: Secondary | ICD-10-CM | POA: Diagnosis not present

## 2017-11-21 DIAGNOSIS — D649 Anemia, unspecified: Secondary | ICD-10-CM | POA: Diagnosis not present

## 2017-11-21 DIAGNOSIS — K250 Acute gastric ulcer with hemorrhage: Secondary | ICD-10-CM | POA: Diagnosis not present

## 2017-11-21 NOTE — Telephone Encounter (Signed)
Yes I agree. Thank you 

## 2017-11-23 DIAGNOSIS — E1140 Type 2 diabetes mellitus with diabetic neuropathy, unspecified: Secondary | ICD-10-CM | POA: Diagnosis not present

## 2017-11-23 DIAGNOSIS — I5030 Unspecified diastolic (congestive) heart failure: Secondary | ICD-10-CM | POA: Diagnosis not present

## 2017-11-23 DIAGNOSIS — D649 Anemia, unspecified: Secondary | ICD-10-CM | POA: Diagnosis not present

## 2017-11-23 DIAGNOSIS — I110 Hypertensive heart disease with heart failure: Secondary | ICD-10-CM | POA: Diagnosis not present

## 2017-11-23 DIAGNOSIS — K250 Acute gastric ulcer with hemorrhage: Secondary | ICD-10-CM | POA: Diagnosis not present

## 2017-11-23 DIAGNOSIS — R55 Syncope and collapse: Secondary | ICD-10-CM | POA: Diagnosis not present

## 2017-11-24 ENCOUNTER — Ambulatory Visit (INDEPENDENT_AMBULATORY_CARE_PROVIDER_SITE_OTHER): Payer: Medicare Other | Admitting: Internal Medicine

## 2017-11-24 ENCOUNTER — Encounter: Payer: Self-pay | Admitting: Physician Assistant

## 2017-11-24 ENCOUNTER — Ambulatory Visit: Payer: Medicare Other | Admitting: Dietician

## 2017-11-24 ENCOUNTER — Encounter: Payer: Self-pay | Admitting: Dietician

## 2017-11-24 ENCOUNTER — Ambulatory Visit (INDEPENDENT_AMBULATORY_CARE_PROVIDER_SITE_OTHER): Payer: Medicare Other | Admitting: Physician Assistant

## 2017-11-24 VITALS — BP 134/70 | HR 72 | Ht 64.5 in | Wt 180.1 lb

## 2017-11-24 DIAGNOSIS — D509 Iron deficiency anemia, unspecified: Secondary | ICD-10-CM | POA: Diagnosis not present

## 2017-11-24 DIAGNOSIS — E118 Type 2 diabetes mellitus with unspecified complications: Secondary | ICD-10-CM

## 2017-11-24 DIAGNOSIS — E785 Hyperlipidemia, unspecified: Secondary | ICD-10-CM | POA: Diagnosis not present

## 2017-11-24 DIAGNOSIS — Z0000 Encounter for general adult medical examination without abnormal findings: Secondary | ICD-10-CM

## 2017-11-24 DIAGNOSIS — Z8719 Personal history of other diseases of the digestive system: Secondary | ICD-10-CM

## 2017-11-24 DIAGNOSIS — Z8711 Personal history of peptic ulcer disease: Secondary | ICD-10-CM

## 2017-11-24 DIAGNOSIS — E119 Type 2 diabetes mellitus without complications: Secondary | ICD-10-CM

## 2017-11-24 DIAGNOSIS — I10 Essential (primary) hypertension: Secondary | ICD-10-CM

## 2017-11-24 DIAGNOSIS — D649 Anemia, unspecified: Secondary | ICD-10-CM

## 2017-11-24 DIAGNOSIS — E1169 Type 2 diabetes mellitus with other specified complication: Secondary | ICD-10-CM | POA: Diagnosis not present

## 2017-11-24 DIAGNOSIS — I248 Other forms of acute ischemic heart disease: Secondary | ICD-10-CM | POA: Diagnosis not present

## 2017-11-24 DIAGNOSIS — K250 Acute gastric ulcer with hemorrhage: Secondary | ICD-10-CM

## 2017-11-24 LAB — POCT GLYCOSYLATED HEMOGLOBIN (HGB A1C): Hemoglobin A1C: 5.6 % (ref 4.0–5.6)

## 2017-11-24 LAB — GLUCOSE, CAPILLARY: Glucose-Capillary: 104 mg/dL — ABNORMAL HIGH (ref 65–99)

## 2017-11-24 LAB — HM DIABETES EYE EXAM

## 2017-11-24 NOTE — Patient Instructions (Signed)
If you are age 65 or older, your body mass index should be between 23-30. Your Body mass index is 30.44 kg/m. If this is out of the aforementioned range listed, please consider follow up with your Primary Care Provider.  If you are age 8 or younger, your body mass index should be between 19-25. Your Body mass index is 30.44 kg/m. If this is out of the aformentioned range listed, please consider follow up with your Primary Care Provider.   You have been scheduled for an endoscopy. Please follow written instructions given to you at your visit today. If you use inhalers (even only as needed), please bring them with you on the day of your procedure. Your physician has requested that you go to www.startemmi.com and enter the access code given to you at your visit today. This web site gives a general overview about your procedure. However, you should still follow specific instructions given to you by our office regarding your preparation for the procedure.  Continue Iron.   Continue Pantoprazole  Thank you for choosing me and [facility].   Ellouise Newer, PA-C

## 2017-11-24 NOTE — Progress Notes (Signed)
CC: Diabetes Mellitus and GI bleed Follow up  HPI:  [name] is a 67 y.o. with diabetes mellitus type 2, hypertension, iron and b12 deficiency, gastric ulcer, and hyperlipidemia who presented for diabetes mellitus follow up. Please see problem based charting for evaluation, assessment, and plan.  Past Medical History:  Diagnosis Date  . Allergy   . Anemia   . Arthritis    "left knee" (09/08/2015)  . Asthma   . Brachial plexus disorders   . CHF (congestive heart failure) ([place])   . Chronic pain    neck pain, headache, neuropathy  . COPD (chronic obstructive pulmonary disease) ([place])    SEES ONLY DR. PATEL   . Depression    "when my husband passed in 2013"  . Gastric ulcer   . GERD (gastroesophageal reflux disease)   . Hypertension   . Hypertriglyceridemia   . Migraine    "monthly" (09/08/2015)  . Neuromuscular disorder (HCC)    neuropathy  . Puncture wound of foot, right 05/17/2012   Tetanus shot 3 yrs ago at [facility] in [place], per pt report   . Stroke ([place])    TIAS    IN [place]   4 YRS AGO   . Type II diabetes mellitus ([place]) 2010   diagnosed around 2010, only ever on metformin   Review of Systems:    Dizziness, decreased energy, decreased appetite Denies diarrhea, nausea/vomiting, abdominal pain   Physical Exam:  There were no vitals filed for this visit. BP= 99/57, pulse=79  Physical Exam  Constitutional: She appears well-developed and well-nourished. No distress.  HENT:  Head: Normocephalic and atraumatic.  Eyes: Conjunctivae are normal.  Cardiovascular: Normal rate, regular rhythm and normal heart sounds.  Respiratory: Effort normal and breath sounds normal. No respiratory distress. She has no wheezes.  GI: Soft. Bowel sounds are normal. She exhibits no distension. There is no tenderness.  Musculoskeletal: She exhibits no edema.  Neurological: She is alert.  Skin: Skin is warm and dry. She is not diaphoretic. No erythema.  Psychiatric: She  has a normal mood and affect. Her behavior is normal. Judgment and thought content normal.     Assessment & Plan:   See Encounters Tab for problem based charting.  Acute gastric ulcer with hemorrhage The patient was recently admitted from 11/06/17-11/10/17 during which she was treated for a mild peptic ulcer disease and she was managed for a combination of iron deficiency and b12 deficiency anemia. EGD showed 2 non-bleeding gastric ulcers. Prior colonoscopy from 05/2017 showed a 26mm\ sessile polyp and internal hemorrhoids. LBH, haptoglobin, and peripheral blood smear were within normal limits. B12 was low at 180 and hb was 6-7 during hospitalization. She was given a total of 3 prbcs, 500mg  fe infusion, and b12 supplementation. Patient was discharged with hb of 9.0 and instructed to stop nsaids and excedrin and start pantoprazole 40mg  qd, b12, and iron, continue asa 81mg  for secondary prevention.   The patient saw gastroenterology for follow up today 6/14 and they mentioned that they will do a repeat EGD 8-12 weeks from previous EGD done in May.  Assessment and plan The patient has continued to not feel like doing anything, she has been feeling dizzy with positional changes, and has been having to hold walls when she stands up.   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 The patient may have auto immune cause it maybe due to celiac disease, also perhaps due to medication. Will evaluate  for celiac disease and presence of intrinsic factor. The patient's metformin may also be causing some degree of malabsorption which is leading to vitamin deficiency (b12).   -Checked antibodies for celiac disease  -Checked intrinsic factor antibodies -Will recheck ferritin and b12 levels to determine whether supplementation needs be adjusted  -Will determine whether there is a need to possibly discontinue metformin after ruling out other causes -continue  pantoprazole 40mg  daily  -Ferrous sulfate 325mg   -Vitamin b12 1037mcg qd -Orthostatic vital signs were checked during this encounter and were negative   Diabetes Mellitus type 2 The patient is taking sitagliptin-metformin 50-1000mg  qd and has been compliant with her medication. The patient's last a1c=7.8 on 07/21/17. The patient's home blood glucose measurements over the past month have ranged 60-200. The patient does/does not note episodes of hypoglycemia. The patient has lost 6lbs since previous visit in May 2019,  The patient's a1c during this visit decreased to 5.6  Assessment and plan  Since the patient's a1c decreased and is in good range. Will continue current course of therapy.   -continue sitagliptan-metformin 50-1000mg  qd  Hyperlipidemia The patient is currently taking rosuvastatin 40mg  qd  Health Maintenance Urine microalbumin  Patient discussed with Dr. Rebeca Alert

## 2017-11-24 NOTE — Patient Instructions (Signed)
It was a pleasure to see you today [name]. Please make the following changes:  -Your Gabapentin has been increased to 600mg  tid -Please continue to follow with your gastroenterologist regarding your ulcers and GI bleed -Please continue to take iron and b12 supplementation  If you have any questions or concerns, please call our clinic at [phone] between 9am-5pm and after hours call [phone] and ask for the internal medicine resident on call. If you feel you are having a medical emergency please call 911.   Thank you, we look forward to help you remain healthy!  Lars Mage, MD Internal Medicine PGY1

## 2017-11-24 NOTE — Progress Notes (Addendum)
Chief Complaint: Follow-up hospitalization for gastric ulcer  HPI:    [name] is a 67 year old female, known to Dr. Hilarie Fredrickson, with a past medical history as listed below, who presents clinic today for follow-up after recent hospitalization for symptomatic anemia and finding of gastric ulcer.    11/06/2017-11/10/2017 admitted for symptomatic anemia.  Patient had a mixture of iron deficiency anemia and B12 deficiency.  Required 3 units of PRBCs during admission.  Hemoglobin was 7.2 on admission, 9 on discharge.  Her home aspirin was continued for secondary prevention given prior TIA.  She is also given prescriptions for B12 and iron.  11/07/2017 EGD with Dr. Henrene Pastor showed 2 nonbleeding gastric ulcers, largest 7 mm, with no stigmata of bleeding otherwise.  CLO testing negative.  Started on pantoprazole 40 mg daily.       11/14/2017 CBC showed hemoglobin of 9.2.    Today, patient describes that since returning home from the hospital she has remained quite fatigued and just "does not feel like doing anything", also describes continued positional dizziness.  Finds herself holding onto walls when she is standing up.  Patient is following with her PCP later today and having repeat labs drawn.  She has been on iron 325 mg every other day "to try and prevent side effects", as well as B12.    Denies fever, chills, abdominal pain, blood in her stool, melena, weight loss, nausea, vomiting, heartburn or reflux.  Past Medical History:  Diagnosis Date  . Allergy   . Anemia   . Arthritis    "left knee" (09/08/2015)  . Asthma   . Brachial plexus disorders   . CHF (congestive heart failure) ([place])   . Chronic pain    neck pain, headache, neuropathy  . COPD (chronic obstructive pulmonary disease) ([place])    SEES ONLY DR. PATEL   . Depression    "when my husband passed in 2013"  . GERD (gastroesophageal reflux disease)   . Hypertension   . Hypertriglyceridemia   . Migraine    "monthly" (09/08/2015)  .  Neuromuscular disorder (HCC)    neuropathy  . Puncture wound of foot, right 05/17/2012   Tetanus shot 3 yrs ago at [facility] in [place], per pt report   . Stroke ([place])    TIAS    IN [place]   4 YRS AGO   . Type II diabetes mellitus ([place]) 2010   diagnosed around 2010, only ever on metformin    Past Surgical History:  Procedure Laterality Date  . 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 ARTHRODESIS METATARSAL     RIGHT 5 TH   . BILATERAL SALPINGOOPHORECTOMY Bilateral 1986   "after hemtoma evacuations; had to cut me open"  . BIOPSY  11/07/2017   Procedure: BIOPSY;  Surgeon: Irene Shipper, MD;  [facility];  Service: Endoscopy;;  . CARPAL TUNNEL RELEASE Bilateral   . COLONOSCOPY     10 + yrs ago- pt unsure- was in [place]- pt states  MD will not send records but colon was normal per pt.   Marland Kitchen DILATION AND CURETTAGE OF UTERUS  ~ 1982   S/P miscarriage  . ESOPHAGOGASTRODUODENOSCOPY (EGD) WITH PROPOFOL N/A 11/07/2017   Procedure: ESOPHAGOGASTRODUODENOSCOPY (EGD) WITH PROPOFOL;  Surgeon: Irene Shipper, MD;  Location: [facility] ENDOSCOPY;  Service: Endoscopy;  Laterality: N/A;  . HEMATOMA EVACUATION Right 1986 x  2   "OVARY; w/in 1 wk after hysterectomy"  . KNEE ARTHROSCOPY     LEFT  . LAPAROSCOPIC CHOLECYSTECTOMY    . SHOULDER ARTHROSCOPY W/ ROTATOR CUFF REPAIR Bilateral   . TONSILLECTOMY    . TOTAL KNEE ARTHROPLASTY Left 09/30/2016   Procedure: LEFT TOTAL KNEE ARTHROPLASTY;  Surgeon: Netta Cedars, MD;  Location: [facility];  Service: Orthopedics;  Laterality: Left;  . TUBAL LIGATION    . VAGINAL HYSTERECTOMY  1986    Current Outpatient Medications  Medication Sig Dispense Refill  . albuterol (PROVENTIL HFA;VENTOLIN HFA) 108 (90 Base) MCG/ACT inhaler Inhale 1-2 puffs into the lungs every 6 (six) hours as needed for wheezing or shortness of breath. 1 Inhaler 5    . aspirin 81 MG tablet Take 81 mg by mouth every morning.     . ferrous sulfate 325 (65 FE) MG tablet Take 1 tablet (325 mg total) by mouth every other day. 30 tablet 3  . fluticasone (FLONASE) 50 MCG/ACT nasal spray Place 1 spray into both nostrils as needed for allergies or rhinitis.     Marland Kitchen gabapentin (NEURONTIN) 400 MG capsule Take 1 capsule (400 mg total) by mouth 3 (three) times daily. 270 capsule 1  . glipiZIDE (GLUCOTROL XL) 5 MG 24 hr tablet Take 1 tablet (5 mg total) by mouth daily with breakfast. (Patient taking differently: Take 5 mg by mouth at bedtime. ) 90 tablet 3  . glucose blood (ONETOUCH VERIO) test strip Check blood sugar 3x/day 100 each 3  . pantoprazole (PROTONIX) 40 MG tablet Take 1 tablet (40 mg total) by mouth daily. 30 tablet 0  . rosuvastatin (CRESTOR) 40 MG tablet Take 1 tablet (40 mg total) by mouth daily. 90 tablet 3  . sertraline (ZOLOFT) 100 MG tablet TAKE 1 TABLET(100 MG) BY MOUTH DAILY 90 tablet 0  . vitamin B-12 1000 MCG tablet Take 1 tablet (1,000 mcg total) by mouth daily. 30 tablet 3  . sitaGLIPtin-metformin (JANUMET) 50-1000 MG tablet Take 1 tablet by mouth daily. 90 tablet 0   No current facility-administered medications for this visit.     Allergies as of 11/24/2017 - Review Complete 11/24/2017  Allergen Reaction Noted  . Anesthesia s-i-60 Other (See Comments) 01/20/2017  . Flexeril [cyclobenzaprine] Other (See Comments) 08/12/2012  . Soma [carisoprodol] Itching and Rash 05/13/2012    Family History  Problem Relation Age of Onset  . Hyperlipidemia Mother   . Heart attack Father 40  . Hypertension Father   . Cancer Paternal Grandfather        Lung cancer  . Cancer Maternal Grandmother   . Heart attack Maternal Grandmother   . Colon cancer Neg Hx   . Colon polyps Neg Hx   . Esophageal cancer Neg Hx   . Rectal cancer Neg Hx   . Stomach cancer Neg Hx     Social History   Socioeconomic History  . Marital status: Widowed    Spouse name: Not  on file  . Number of children: Not on file  . Years of education: 64  . Highest education level: Not on file  Occupational History  . Occupation: unemployed  Social Needs  . Financial resource strain: Not on file  . Food insecurity:    Worry: Not on file    Inability: Not on file  . Transportation needs:    Medical: Not on file    Non-medical: Not on file  Tobacco Use  . Smoking status: Current Some Day Smoker  Packs/day: 0.10    Years: 47.00    Pack years: 4.70    Types: Cigarettes    Last attempt to quit: 08/11/2016    Years since quitting: 1.2  . Smokeless tobacco: Never Used  . Tobacco comment:  2 cigs per day  Substance and Sexual Activity  . Alcohol use: No    Alcohol/week: 0.0 oz  . Drug use: No  . Sexual activity: Not Currently  Lifestyle  . Physical activity:    Days per week: Not on file    Minutes per session: Not on file  . Stress: Not on file  Relationships  . Social connections:    Talks on phone: Not on file    Gets together: Not on file    Attends religious service: Not on file    Active member of club or organization: Not on file    Attends meetings of clubs or organizations: Not on file    Relationship status: Not on file  . Intimate partner violence:    Fear of current or ex partner: Not on file    Emotionally abused: Not on file    Physically abused: Not on file    Forced sexual activity: Not on file  Other Topics Concern  . Not on file  Social History Narrative   Moved to [facility] from [place], shortly after the death of her husband, to live with her daughter.  She notes significant life stress related to her 38 year-old grand-daughter with bipolar disorder.  She has previously worked as a Occupational psychologist.    Review of Systems:    Constitutional: No weight loss, fever or chills Skin: No rash  Cardiovascular: No chest pain Respiratory: No SOB  Gastrointestinal: See HPI and otherwise negative   Physical Exam:  Vital signs: BP  134/70 (BP Location: Left Arm, Patient Position: Sitting, Cuff Size: Normal)   Pulse 72   Ht 5' 4.5" (1.638 m) Comment: height measured without shoes  Wt 180 lb 2 oz (81.7 kg)   BMI 30.44 kg/m    Constitutional:   Pleasant Caucasian female appears to be in NAD, Well developed, Well nourished, alert and cooperative Respiratory: Respirations even and unlabored. Lungs clear to auscultation bilaterally.   No wheezes, crackles, or rhonchi.  Cardiovascular: Normal S1, S2. No MRG. Regular rate and rhythm. No peripheral edema, cyanosis or pallor.  Gastrointestinal:  Soft, nondistended, nontender. No rebound or guarding. Normal bowel sounds. No appreciable masses or hepatomegaly. Psychiatric: Demonstrates good judgement and reason without abnormal affect or behaviors.  RELEVANT LABS AND IMAGING: CBC    Component Value Date/Time   WBC 7.9 11/14/2017 1205   RBC 3.08 (L) 11/14/2017 1205   HGB 9.2 (L) 11/14/2017 1205   HGB 10.8 (L) 06/16/2017 1120   HCT 29.8 (L) 11/14/2017 1205   HCT 33.0 (L) 06/16/2017 1120   PLT 200 11/14/2017 1205   PLT 224 06/16/2017 1120   MCV 96.8 11/14/2017 1205   MCV 91 06/16/2017 1120   MCH 29.9 11/14/2017 1205   MCHC 30.9 11/14/2017 1205   RDW 19.8 (H) 11/14/2017 1205   RDW 14.6 06/16/2017 1120   LYMPHSABS 1.6 11/06/2017 1340   MONOABS 0.6 11/06/2017 1340   EOSABS 0.2 11/06/2017 1340   BASOSABS 0.1 11/06/2017 1340    CMP     Component Value Date/Time   NA 143 11/09/2017 0834   NA 146 (H) 06/16/2017 1120   K 3.6 11/09/2017 0834   CL 116 (H) 11/09/2017 9163  CO2 21 (L) 11/09/2017 0834   GLUCOSE 127 (H) 11/09/2017 0834   BUN 13 11/09/2017 0834   BUN 26 06/16/2017 1120   CREATININE 0.84 11/09/2017 0834   CREATININE 0.76 09/22/2014 1036   CALCIUM 8.8 (L) 11/09/2017 0834   CALCIUM 10.3 08/20/2012 1402   PROT 6.0 (L) 11/06/2017 1340   ALBUMIN 3.3 (L) 11/06/2017 1340   AST 28 11/06/2017 1340   ALT 81 (H) 11/06/2017 1340   ALKPHOS 50 11/06/2017 1340    BILITOT 0.3 11/06/2017 1340   GFRNONAA >60 11/09/2017 0834   GFRNONAA 85 09/22/2014 1036   GFRAA >60 11/09/2017 0834   GFRAA >89 09/22/2014 1036    Assessment: 1.  Symptomatic anemia: Hemoglobin was as low as 7.2 on 11/08/2017, recently 9.2 on 11/14/2017, being followed by PCP, currently on iron and B12; likely from ulcers below 2.  History of gastric ulcer: 2 ulcers seen on recent EGD 10/28/17, CLO test negative  Plan: 1.  Discussed case with Dr. Hilarie Fredrickson at time of patient's appointment.  Recommend a repeat EGD for surveillance.  This will be performed 8-12 weeks after EGD in May.  Did review risks, benefits, limitations alternatives and patient agrees to proceed. 2.  Continue Pantoprazole 40 mg daily. 3.  Continue Ferrous sulfate 325 mg and B12 per PCP 4.  Patient to follow in clinic per recommendations from Dr. Hilarie Fredrickson after time of procedure.  Julia Newer, PA-C [facility] 11/24/2017, 10:08 AM  Cc: Lars Mage, MD   Addendum: Reviewed and agree with management.  Recent hemorrhagic gastric ulcer. Pyrtle, Lajuan Lines, MD

## 2017-11-25 NOTE — Assessment & Plan Note (Signed)
The patient was recently admitted from 11/06/17-11/10/17 during which she was treated for a mild peptic ulcer disease and she was managed for a combination of iron deficiency and b12 deficiency anemia. EGD showed 2 non-bleeding gastric ulcers. Prior colonoscopy from 05/2017 showed a 90mm\ sessile polyp and internal hemorrhoids. LBH, haptoglobin, and peripheral blood smear were within normal limits. B12 was low at 180 and hb was 6-7 during hospitalization. She was given a total of 3 prbcs, 500mg  fe infusion, and b12 supplementation. Patient was discharged with hb of 9.0 and instructed to stop nsaids and excedrin and start pantoprazole 40mg  qd, b12, and iron, continue asa 81mg  for secondary prevention.   The patient saw gastroenterology for follow up today 6/14 and they mentioned that they will do a repeat EGD 8-12 weeks from previous EGD done in May.  Assessment and plan The patient has continued to not feel like doing anything, she has been feeling dizzy with positional changes, and has been having to hold walls when she stands up.   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 The patient may have auto immune cause it maybe due to celiac disease, also perhaps due to medication. Will evaluate for celiac disease and presence of intrinsic factor. The patient's metformin may also be causing some degree of malabsorption which is leading to vitamin deficiency (b12).   -Checked antibodies for celiac disease  -Checked intrinsic factor antibodies -Will recheck ferritin and b12 levels to determine whether supplementation needs be adjusted  -Will determine whether there is a need to possibly discontinue metformin after ruling out other causes -continue pantoprazole 40mg  daily  -Ferrous sulfate 325mg   -Vitamin b12 1046mcg qd -Orthostatic vital signs were checked during this encounter and were negative

## 2017-11-25 NOTE — Assessment & Plan Note (Signed)
The patient is taking sitagliptin-metformin 50-1000mg  qd and has been compliant with her medication. The patient's last a1c=7.8 on 07/21/17. The patient's home blood glucose measurements over the past month have ranged 60-200. The patient does/does not note episodes of hypoglycemia. The patient has lost 6lbs since previous visit in May 2019,  The patient's a1c during this visit decreased to 5.6  Assessment and plan  Since the patient's a1c decreased and is in good range. Will continue current course of therapy.   -continue sitagliptan-metformin 50-1000mg  qd

## 2017-11-25 NOTE — Assessment & Plan Note (Signed)
Checking urine microalbumin

## 2017-11-25 NOTE — Assessment & Plan Note (Signed)
The patient is currently taking rosuvastatin 40mg  qd

## 2017-11-26 ENCOUNTER — Telehealth: Payer: Self-pay | Admitting: Internal Medicine

## 2017-11-26 ENCOUNTER — Inpatient Hospital Stay (HOSPITAL_COMMUNITY)
Admission: EM | Admit: 2017-11-26 | Discharge: 2017-11-29 | DRG: 312 | Disposition: A | Discharge status: Home Health | Payer: Medicare Other | Attending: Student in an Organized Health Care Education/Training Program | Admitting: Student in an Organized Health Care Education/Training Program

## 2017-11-26 DIAGNOSIS — B9620 Unspecified Escherichia coli [E. coli] as the cause of diseases classified elsewhere: Secondary | ICD-10-CM | POA: Diagnosis present

## 2017-11-26 DIAGNOSIS — E785 Hyperlipidemia, unspecified: Secondary | ICD-10-CM | POA: Diagnosis present

## 2017-11-26 DIAGNOSIS — N390 Urinary tract infection, site not specified: Secondary | ICD-10-CM | POA: Diagnosis present

## 2017-11-26 DIAGNOSIS — Z79899 Other long term (current) drug therapy: Secondary | ICD-10-CM

## 2017-11-26 DIAGNOSIS — R3129 Other microscopic hematuria: Secondary | ICD-10-CM | POA: Diagnosis not present

## 2017-11-26 DIAGNOSIS — I951 Orthostatic hypotension: Principal | ICD-10-CM | POA: Diagnosis present

## 2017-11-26 DIAGNOSIS — E1140 Type 2 diabetes mellitus with diabetic neuropathy, unspecified: Secondary | ICD-10-CM | POA: Diagnosis present

## 2017-11-26 DIAGNOSIS — I5032 Chronic diastolic (congestive) heart failure: Secondary | ICD-10-CM | POA: Diagnosis present

## 2017-11-26 DIAGNOSIS — E781 Pure hyperglyceridemia: Secondary | ICD-10-CM | POA: Diagnosis present

## 2017-11-26 DIAGNOSIS — Z7951 Long term (current) use of inhaled steroids: Secondary | ICD-10-CM

## 2017-11-26 DIAGNOSIS — Z8711 Personal history of peptic ulcer disease: Secondary | ICD-10-CM

## 2017-11-26 DIAGNOSIS — F17210 Nicotine dependence, cigarettes, uncomplicated: Secondary | ICD-10-CM | POA: Diagnosis present

## 2017-11-26 DIAGNOSIS — E860 Dehydration: Secondary | ICD-10-CM | POA: Diagnosis present

## 2017-11-26 DIAGNOSIS — R58 Hemorrhage, not elsewhere classified: Secondary | ICD-10-CM | POA: Diagnosis not present

## 2017-11-26 DIAGNOSIS — N179 Acute kidney failure, unspecified: Secondary | ICD-10-CM | POA: Diagnosis not present

## 2017-11-26 DIAGNOSIS — R062 Wheezing: Secondary | ICD-10-CM | POA: Diagnosis present

## 2017-11-26 DIAGNOSIS — R270 Ataxia, unspecified: Secondary | ICD-10-CM

## 2017-11-26 DIAGNOSIS — Z7984 Long term (current) use of oral hypoglycemic drugs: Secondary | ICD-10-CM

## 2017-11-26 DIAGNOSIS — R0602 Shortness of breath: Secondary | ICD-10-CM | POA: Diagnosis not present

## 2017-11-26 DIAGNOSIS — D696 Thrombocytopenia, unspecified: Secondary | ICD-10-CM | POA: Diagnosis present

## 2017-11-26 DIAGNOSIS — I110 Hypertensive heart disease with heart failure: Secondary | ICD-10-CM | POA: Diagnosis present

## 2017-11-26 DIAGNOSIS — F329 Major depressive disorder, single episode, unspecified: Secondary | ICD-10-CM | POA: Diagnosis present

## 2017-11-26 DIAGNOSIS — R42 Dizziness and giddiness: Secondary | ICD-10-CM

## 2017-11-26 DIAGNOSIS — D509 Iron deficiency anemia, unspecified: Secondary | ICD-10-CM | POA: Diagnosis present

## 2017-11-26 DIAGNOSIS — Z8673 Personal history of transient ischemic attack (TIA), and cerebral infarction without residual deficits: Secondary | ICD-10-CM

## 2017-11-26 DIAGNOSIS — E538 Deficiency of other specified B group vitamins: Secondary | ICD-10-CM | POA: Diagnosis present

## 2017-11-26 DIAGNOSIS — K219 Gastro-esophageal reflux disease without esophagitis: Secondary | ICD-10-CM | POA: Diagnosis present

## 2017-11-26 NOTE — Telephone Encounter (Signed)
   Reason for call:   I received a call from [name] at 11 PM indicating dizziness.   Pertinent Data:   Patient states that she has had an acute onset of dizziness this afternoon at around 6pm that is persistent to now; improved with sitting and lying down, worse with ambulation. Dizziness does not change with change in head position. Not associated with N/V/fevers/D; she is eating and drinking ok. She does have some shortness of breath that is new.  Patient with recent admission for GI bleed (EGD with 2 nonbleeding ulcers) + B12 anemia; she reports compliance with oral iron; endorses black stools since starting iron but denies hematochezia or increased stool frequency or diarrhea.   The degree of dizziness caused her to stumble on her steps    Assessment / Plan / Recommendations:   Patient with persistent dizziness worse with ambulation with mild shortness of breath concerning for orthostatic causes such as possible persistent GI bleeding based on her recent history.   Patient advised to go to ED for evaluation and labs; she is in agreement with this plan.   Alphonzo Grieve, MD   11/26/2017, 11:06 PM

## 2017-11-27 ENCOUNTER — Other Ambulatory Visit: Payer: Self-pay

## 2017-11-27 ENCOUNTER — Other Ambulatory Visit: Payer: Self-pay | Admitting: Dietician

## 2017-11-27 ENCOUNTER — Encounter (HOSPITAL_COMMUNITY): Payer: Self-pay | Admitting: *Deleted

## 2017-11-27 ENCOUNTER — Emergency Department (HOSPITAL_COMMUNITY): Payer: Medicare Other

## 2017-11-27 DIAGNOSIS — Z9889 Other specified postprocedural states: Secondary | ICD-10-CM | POA: Diagnosis not present

## 2017-11-27 DIAGNOSIS — K259 Gastric ulcer, unspecified as acute or chronic, without hemorrhage or perforation: Secondary | ICD-10-CM | POA: Diagnosis not present

## 2017-11-27 DIAGNOSIS — Z8673 Personal history of transient ischemic attack (TIA), and cerebral infarction without residual deficits: Secondary | ICD-10-CM

## 2017-11-27 DIAGNOSIS — I5030 Unspecified diastolic (congestive) heart failure: Secondary | ICD-10-CM | POA: Diagnosis not present

## 2017-11-27 DIAGNOSIS — N179 Acute kidney failure, unspecified: Secondary | ICD-10-CM

## 2017-11-27 DIAGNOSIS — R3129 Other microscopic hematuria: Secondary | ICD-10-CM

## 2017-11-27 DIAGNOSIS — E785 Hyperlipidemia, unspecified: Secondary | ICD-10-CM

## 2017-11-27 DIAGNOSIS — E538 Deficiency of other specified B group vitamins: Secondary | ICD-10-CM | POA: Diagnosis not present

## 2017-11-27 DIAGNOSIS — I110 Hypertensive heart disease with heart failure: Secondary | ICD-10-CM | POA: Diagnosis not present

## 2017-11-27 DIAGNOSIS — Z7984 Long term (current) use of oral hypoglycemic drugs: Secondary | ICD-10-CM

## 2017-11-27 DIAGNOSIS — R42 Dizziness and giddiness: Secondary | ICD-10-CM

## 2017-11-27 DIAGNOSIS — E611 Iron deficiency: Secondary | ICD-10-CM

## 2017-11-27 DIAGNOSIS — E119 Type 2 diabetes mellitus without complications: Secondary | ICD-10-CM

## 2017-11-27 DIAGNOSIS — E118 Type 2 diabetes mellitus with unspecified complications: Secondary | ICD-10-CM

## 2017-11-27 DIAGNOSIS — F17210 Nicotine dependence, cigarettes, uncomplicated: Secondary | ICD-10-CM

## 2017-11-27 DIAGNOSIS — R0602 Shortness of breath: Secondary | ICD-10-CM | POA: Diagnosis not present

## 2017-11-27 DIAGNOSIS — Z79899 Other long term (current) drug therapy: Secondary | ICD-10-CM

## 2017-11-27 DIAGNOSIS — Z888 Allergy status to other drugs, medicaments and biological substances status: Secondary | ICD-10-CM

## 2017-11-27 DIAGNOSIS — Z884 Allergy status to anesthetic agent status: Secondary | ICD-10-CM

## 2017-11-27 LAB — URINALYSIS, ROUTINE W REFLEX MICROSCOPIC
Bacteria, UA: NONE SEEN
Bilirubin Urine: NEGATIVE
GLUCOSE, UA: NEGATIVE mg/dL
KETONES UR: NEGATIVE mg/dL
Nitrite: POSITIVE — AB
PROTEIN: 30 mg/dL — AB
Specific Gravity, Urine: 1.009 (ref 1.005–1.030)
pH: 6 (ref 5.0–8.0)

## 2017-11-27 LAB — BASIC METABOLIC PANEL
ANION GAP: 8 (ref 5–15)
BUN: 20 mg/dL (ref 6–20)
CO2: 20 mmol/L — AB (ref 22–32)
Calcium: 8.9 mg/dL (ref 8.9–10.3)
Chloride: 109 mmol/L (ref 101–111)
Creatinine, Ser: 1.46 mg/dL — ABNORMAL HIGH (ref 0.44–1.00)
GFR calc non Af Amer: 37 mL/min — ABNORMAL LOW (ref >60)
GFR, EST AFRICAN AMERICAN: 43 mL/min — AB (ref >60)
Glucose, Bld: 102 mg/dL — ABNORMAL HIGH (ref 65–99)
POTASSIUM: 4 mmol/L (ref 3.5–5.1)
SODIUM: 137 mmol/L (ref 135–145)

## 2017-11-27 LAB — CK: Total CK: 102 U/L (ref 38–234)

## 2017-11-27 LAB — COMPREHENSIVE METABOLIC PANEL
ALK PHOS: 53 U/L (ref 38–126)
ALT: 34 U/L (ref 14–54)
AST: 31 U/L (ref 15–41)
Albumin: 3 g/dL — ABNORMAL LOW (ref 3.5–5.0)
Anion gap: 7 (ref 5–15)
BUN: 24 mg/dL — AB (ref 6–20)
CALCIUM: 8.5 mg/dL — AB (ref 8.9–10.3)
CO2: 21 mmol/L — ABNORMAL LOW (ref 22–32)
CREATININE: 1.3 mg/dL — AB (ref 0.44–1.00)
Chloride: 112 mmol/L — ABNORMAL HIGH (ref 101–111)
GFR calc Af Amer: 49 mL/min — ABNORMAL LOW (ref >60)
GFR, EST NON AFRICAN AMERICAN: 42 mL/min — AB (ref >60)
Glucose, Bld: 206 mg/dL — ABNORMAL HIGH (ref 65–99)
Potassium: 4.2 mmol/L (ref 3.5–5.1)
Sodium: 140 mmol/L (ref 135–145)
TOTAL PROTEIN: 5.8 g/dL — AB (ref 6.5–8.1)
Total Bilirubin: 0.4 mg/dL (ref 0.3–1.2)

## 2017-11-27 LAB — CBC
HCT: 30.7 % — ABNORMAL LOW (ref 36.0–46.0)
HEMATOCRIT: 30.7 % — AB (ref 36.0–46.0)
HEMOGLOBIN: 9 g/dL — AB (ref 12.0–15.0)
Hemoglobin: 9 g/dL — ABNORMAL LOW (ref 12.0–15.0)
MCH: 29.6 pg (ref 26.0–34.0)
MCH: 29.8 pg (ref 26.0–34.0)
MCHC: 29.3 g/dL — ABNORMAL LOW (ref 30.0–36.0)
MCHC: 29.3 g/dL — ABNORMAL LOW (ref 30.0–36.0)
MCV: 101 fL — AB (ref 78.0–100.0)
MCV: 101.7 fL — AB (ref 78.0–100.0)
PLATELETS: 120 10*3/uL — AB (ref 150–400)
PLATELETS: 139 10*3/uL — AB (ref 150–400)
RBC: 3.02 MIL/uL — ABNORMAL LOW (ref 3.87–5.11)
RBC: 3.04 MIL/uL — ABNORMAL LOW (ref 3.87–5.11)
RDW: 18.5 % — AB (ref 11.5–15.5)
RDW: 18.5 % — ABNORMAL HIGH (ref 11.5–15.5)
WBC: 6 10*3/uL (ref 4.0–10.5)
WBC: 7.4 10*3/uL (ref 4.0–10.5)

## 2017-11-27 LAB — VITAMIN B12: VITAMIN B 12: 879 pg/mL (ref 180–914)

## 2017-11-27 LAB — TROPONIN I

## 2017-11-27 LAB — GLUCOSE, CAPILLARY
GLUCOSE-CAPILLARY: 130 mg/dL — AB (ref 65–99)
GLUCOSE-CAPILLARY: 112 mg/dL — AB (ref 65–99)

## 2017-11-27 LAB — CBG MONITORING, ED: GLUCOSE-CAPILLARY: 138 mg/dL — AB (ref 65–99)

## 2017-11-27 LAB — INTRINSIC FACTOR ANTIBODIES: Intrinsic Factor Abs, Serum: 1 AU/mL (ref 0.0–1.1)

## 2017-11-27 LAB — I-STAT CG4 LACTIC ACID, ED: Lactic Acid, Venous: 0.71 mmol/L (ref 0.5–1.9)

## 2017-11-27 MED ORDER — TRAMADOL HCL 50 MG PO TABS
50.0000 mg | ORAL_TABLET | Freq: Four times a day (QID) | ORAL | Status: DC | PRN
Start: 2017-11-27 — End: 2017-11-29
  Administered 2017-11-27 – 2017-11-28 (×3): 50 mg via ORAL
  Filled 2017-11-27 (×3): qty 1

## 2017-11-27 MED ORDER — VITAMIN B-12 1000 MCG PO TABS
1000.0000 ug | ORAL_TABLET | Freq: Every day | ORAL | Status: DC
  Administered 2017-11-27 – 2017-11-29 (×3): 1000 ug via ORAL
  Filled 2017-11-27 (×3): qty 1

## 2017-11-27 MED ORDER — ACETAMINOPHEN 325 MG PO TABS
650.0000 mg | ORAL_TABLET | Freq: Four times a day (QID) | ORAL | Status: DC | PRN
  Administered 2017-11-28 – 2017-11-29 (×2): 650 mg via ORAL
  Filled 2017-11-27 (×2): qty 2

## 2017-11-27 MED ORDER — ALBUTEROL SULFATE (2.5 MG/3ML) 0.083% IN NEBU: 2.5000 mg | INHALATION_SOLUTION | Freq: Four times a day (QID) | RESPIRATORY_TRACT | Status: DC | PRN

## 2017-11-27 MED ORDER — GLIPIZIDE ER 5 MG PO TB24
5.0000 mg | ORAL_TABLET | Freq: Every day | ORAL | Status: DC
  Administered 2017-11-27 – 2017-11-28 (×2): 5 mg via ORAL
  Filled 2017-11-27 (×3): qty 1

## 2017-11-27 MED ORDER — ROSUVASTATIN CALCIUM 20 MG PO TABS
40.0000 mg | ORAL_TABLET | Freq: Every day | ORAL | Status: DC
  Administered 2017-11-27 – 2017-11-29 (×3): 40 mg via ORAL
  Filled 2017-11-27: qty 2
  Filled 2017-11-27: qty 4
  Filled 2017-11-27: qty 2

## 2017-11-27 MED ORDER — FERROUS SULFATE 325 (65 FE) MG PO TABS
325.0000 mg | ORAL_TABLET | ORAL | Status: DC
  Administered 2017-11-27 – 2017-11-29 (×2): 325 mg via ORAL
  Filled 2017-11-27 (×2): qty 1

## 2017-11-27 MED ORDER — SODIUM CHLORIDE 0.9 % IV SOLN
2.0000 g | Freq: Once | INTRAVENOUS | Status: AC
  Administered 2017-11-27: 2 g via INTRAVENOUS
  Filled 2017-11-27: qty 20

## 2017-11-27 MED ORDER — SODIUM CHLORIDE 0.9 % IV BOLUS
500.0000 mL | Freq: Once | INTRAVENOUS | Status: AC
  Administered 2017-11-27: 500 mL via INTRAVENOUS

## 2017-11-27 MED ORDER — ACETAMINOPHEN 650 MG RE SUPP: 650.0000 mg | Freq: Four times a day (QID) | RECTAL | Status: DC | PRN

## 2017-11-27 MED ORDER — SODIUM CHLORIDE 0.9 % IV BOLUS
1000.0000 mL | Freq: Once | INTRAVENOUS | Status: AC
  Administered 2017-11-27: 1000 mL via INTRAVENOUS

## 2017-11-27 MED ORDER — PANTOPRAZOLE SODIUM 40 MG PO TBEC
40.0000 mg | DELAYED_RELEASE_TABLET | Freq: Every day | ORAL | Status: DC
  Administered 2017-11-27 – 2017-11-29 (×3): 40 mg via ORAL
  Filled 2017-11-27 (×3): qty 1

## 2017-11-27 MED ORDER — MECLIZINE HCL 25 MG PO TABS
25.0000 mg | ORAL_TABLET | Freq: Once | ORAL | Status: AC
  Administered 2017-11-27: 25 mg via ORAL
  Filled 2017-11-27: qty 1

## 2017-11-27 MED ORDER — SODIUM CHLORIDE 0.9 % IV SOLN
INTRAVENOUS | Status: AC
  Administered 2017-11-27: 125 mL/h via INTRAVENOUS

## 2017-11-27 MED ORDER — GABAPENTIN 600 MG PO TABS
600.0000 mg | ORAL_TABLET | Freq: Three times a day (TID) | ORAL | Status: DC
  Administered 2017-11-27 – 2017-11-29 (×8): 600 mg via ORAL
  Filled 2017-11-27 (×9): qty 1

## 2017-11-27 MED ORDER — FLUTICASONE PROPIONATE 50 MCG/ACT NA SUSP
1.0000 | NASAL | Status: DC | PRN
  Filled 2017-11-27: qty 16

## 2017-11-27 MED ORDER — IPRATROPIUM-ALBUTEROL 0.5-2.5 (3) MG/3ML IN SOLN
3.0000 mL | Freq: Once | RESPIRATORY_TRACT | Status: AC
  Administered 2017-11-27: 3 mL via RESPIRATORY_TRACT
  Filled 2017-11-27: qty 3

## 2017-11-27 NOTE — Discharge Summary (Addendum)
Name: [name] MRN: [id number] DOB: [birth year] 67 y.o. PCP: Lars Mage, MD  Date of Admission: 11/26/2017 11:45 PM Date of Discharge: 11/29/17 Attending Physician: Axel Filler, *  Discharge Diagnosis: Dizziness orthostasis UTI  Discharge Medications: Allergies as of 11/29/2017      Reactions   Anesthesia S-i-60 Other (See Comments)   Hallucinations.   Flexeril [cyclobenzaprine] Other (See Comments)   Prolonged QTc to 571, tachycardia   Soma [carisoprodol] Itching, Rash      Medication List    STOP taking these medications   sitaGLIPtin-metformin 50-1000 MG tablet Commonly known as:  JANUMET     TAKE these medications   albuterol 108 (90 Base) MCG/ACT inhaler Commonly known as:  PROVENTIL HFA;VENTOLIN HFA Inhale 1-2 puffs into the lungs every 6 (six) hours as needed for wheezing or shortness of breath.   aspirin 81 MG tablet Take 81 mg by mouth daily.   cephALEXin 500 MG capsule Commonly known as:  KEFLEX Take 1 capsule (500 mg total) by mouth 2 (two) times daily for 7 days.   cyanocobalamin 1000 MCG tablet Take 1 tablet (1,000 mcg total) by mouth daily.   ferrous sulfate 325 (65 FE) MG tablet Take 1 tablet (325 mg total) by mouth every other day.   fluticasone 50 MCG/ACT nasal spray Commonly known as:  FLONASE Place 1 spray into both nostrils as needed for allergies or rhinitis.   gabapentin 400 MG capsule Commonly known as:  NEURONTIN Take 1 capsule (400 mg total) by mouth 3 (three) times daily. What changed:  Another medication with the same name was removed. Continue taking this medication, and follow the directions you see here.   glipiZIDE 5 MG 24 hr tablet Commonly known as:  GLUCOTROL XL Take 1 tablet (5 mg total) by mouth daily with breakfast. What changed:  when to take this   glucose blood test strip Commonly known as:  ONETOUCH VERIO Check blood sugar 3x/day   pantoprazole 40 MG tablet Commonly known as:   PROTONIX Take 1 tablet (40 mg total) by mouth daily.   rosuvastatin 40 MG tablet Commonly known as:  CRESTOR Take 1 tablet (40 mg total) by mouth daily.   sertraline 100 MG tablet Commonly known as:  ZOLOFT TAKE 1 TABLET(100 MG) BY MOUTH DAILY       Disposition and follow-up:   [name] was discharged from [facility] in Good condition.  At the hospital follow up visit please address:  Dizziness/orthostasis/sinus bradycardia:  -encourage decreased caffeine intake, getting up slowly -work with physical therapy -check volume status, repeat a CBC -consider ischemic cardiac workup as pt likely has underlying coronary disease -continue repleting B12 level has improved   AKI: resolved with IVF -check bmp  Microscopic Hematuria: Seen on 3 prior UA's  -will need outpatient workup consider urology referral   Type II DM:  -Holding Janumet  -Continue glipizide 5 mg QHS, adjust regimen as needed, seemed to do very well inpatient without janumet which can sometimes cause dizziness/headache  HFpEF -continue to assess volume status   Wheezing -will need PFT's were ordered but never completed in 2015, has albuterol inhaler at home  UTI -ensure pt completed course of keflex  2.  Labs / imaging needed at time of follow-up: cbc, bmp  3.  Pending labs/ test needing follow-up: none  Follow-up Appointments: Follow-up Information    Chundi, Verne Spurr, MD Follow up in 1 week(s).   Specialty:  Internal Medicine Why:  Please follow  up in our clinic on Tuesday June 15th at 10:15am Contact information: [place] [id number] [phone]           Hospital Course by problem list: Dizziness orthostasis UTI  Patient arrived dizzy, hypovolemic, hypotensive, positive orthostatic vital signs in the emergency department.  The patient had an additional complaint of polyuria.  She reported that she was drinking two large diet cokes and 2-3 bottles of  water daily for her fluid intake. She was found to have a stable hemoglobin count at about 9, her B12 level and ferritin levels now within the normal range.  She also had an acute kidney injury that was presumed to be prerenal.  Her symptoms improved with IV fluid hydration.  She did have some persistence of her dizziness but felt it was much improved.  We repeated her orthostatic vital signs which were negative for orthostasis after IV fluid resuscitation.  Given the mild continued symptoms and the patient's reported headache we decided to perform an MRI with contrast.  On admission she had a urinalysis that returned positive for nitrite as well as a urine culture that was positive for E. coli greater than 100,000 CFU per ml. She was discharged with keflex for a one week course as her culture revealed good sensitivity to 1st generation cephalosporins. Telemetry was reviewed during her stay, no arrhythmias were noted, the patient was noted to be in sinus bradycardia more often than not.  Patient was instructed that when she gets up from now on to do so very slowly.  The patient found physical therapy to be very helpful as well, we continued the physical therapy during her stay and ordered home health physical therapy to work with her at home.   Discharge Vitals:   BP 124/60 (BP Location: Right Arm)   Pulse (!) 53   Temp 98.4 F (36.9 C) (Oral)   Resp 20   Ht 5\' 6"  (1.676 m)   Wt 184 lb 1.4 oz (83.5 kg)   SpO2 97%   BMI 29.71 kg/m   Pertinent Labs, Studies, and Procedures:  CBC Latest Ref Rng & Units 11/29/2017 11/28/2017 11/27/2017  WBC 4.0 - 10.5 K/uL 4.3 4.6 6.0  Hemoglobin 12.0 - 15.0 g/dL 10.0(L) 9.4(L) 9.0(L)  Hematocrit 36.0 - 46.0 % 33.9(L) 32.1(L) 30.7(L)  Platelets 150 - 400 K/uL 121(L) 109(L) 120(L)   BMP Latest Ref Rng & Units 11/29/2017 11/28/2017 11/27/2017  Glucose 65 - 99 mg/dL 104(H) 99 206(H)  BUN 6 - 20 mg/dL 14 14 24(H)  Creatinine 0.44 - 1.00 mg/dL 1.09(H) 1.04(H) 1.30(H)   BUN/Creat Ratio 12 - 28 - - -  Sodium 135 - 145 mmol/L 140 143 140  Potassium 3.5 - 5.1 mmol/L 4.4 4.2 4.2  Chloride 101 - 111 mmol/L 112(H) 116(H) 112(H)  CO2 22 - 32 mmol/L 24 21(L) 21(L)  Calcium 8.9 - 10.3 mg/dL 9.4 8.8(L) 8.5(L)   CLINICAL DATA:  Initial evaluation for episodic peripheral vertigo.   EXAM: MRI HEAD WITHOUT AND WITH CONTRAST   TECHNIQUE: Multiplanar, multiecho pulse sequences of the brain and surrounding structures were obtained without and with intravenous contrast.   CONTRAST:  44mL MULTIHANCE GADOBENATE DIMEGLUMINE 529 MG/ML IV SOLN   COMPARISON:  Prior CT from 11/27/2017   FINDINGS: Brain: Generalized age-related cerebral atrophy. Patchy T2/FLAIR hyperintensity within the periventricular and deep white matter both cerebral hemispheres as well as the pons, consistent with chronic small vessel ischemic change, moderate nature. Small remote lacunar infarct present  within the left basal ganglia.   No evidence for acute or subacute ischemia. Gray-white matter differentiation maintained. No acute or chronic intracranial hemorrhage.   No mass lesion, midline shift or mass effect. No hydrocephalus. No extra-axial fluid collection. Incidental note made of a partially empty sella.   Vascular: Major intracranial vascular flow voids maintained at the skull base.   Skull and upper cervical spine: Craniocervical junction normal. Upper cervical spine within normal limits. Bone marrow signal intensity normal. No scalp soft tissue abnormality.   Sinuses/Orbits: Globes and orbital soft tissues within normal limits. Bilateral maxillary sinus retention cyst. Scattered mucosal thickening throughout the remainder of the paranasal sinuses. No mastoid effusion. Inner ear structures normal.   Other: None.   IMPRESSION: 1. No acute intracranial abnormality. 2. Age-related cerebral atrophy with moderate chronic small vessel ischemic disease.     Electronically  Signed   By: Jeannine Boga M.D.   On: 11/28/2017 21:01  Urinalysis    Component Value Date/Time   COLORURINE YELLOW 11/27/2017 0155   APPEARANCEUR HAZY (A) 11/27/2017 0155   LABSPEC 1.009 11/27/2017 0155   PHURINE 6.0 11/27/2017 0155   GLUCOSEU NEGATIVE 11/27/2017 0155   HGBUR LARGE (A) 11/27/2017 0155   BILIRUBINUR NEGATIVE 11/27/2017 0155   BILIRUBINUR Negative 05/14/2014 1116   KETONESUR NEGATIVE 11/27/2017 0155   PROTEINUR 30 (A) 11/27/2017 0155   UROBILINOGEN 1.0 07/11/2014 1320   NITRITE POSITIVE (A) 11/27/2017 0155   LEUKOCYTESUR LARGE (A) 11/27/2017 0155   Component 2d ago  Specimen Description URINE, RANDOM   Special Requests NONE  Performed at [facility], [place] [address] [place], [place] [address]     Culture >=100,000 COLONIES/mL ESCHERICHIA COLIAbnormal    Report Status 11/29/2017 FINAL   Organism ID, Bacteria ESCHERICHIA COLIAbnormal    Resulting Agency [facility]  Susceptibility    Escherichia coli    MIC    AMPICILLIN <=2 SENSITIVE  Sensitive    AMPICILLIN/SULBACTAM <=2 SENSITIVE  Sensitive    CEFAZOLIN <=4 SENSITIVE  Sensitive    CEFTRIAXONE <=1 SENSITIVE  Sensitive    CIPROFLOXACIN <=0.25 SENS... Sensitive    Extended ESBL NEGATIVE  Sensitive    GENTAMICIN <=1 SENSITIVE  Sensitive    IMIPENEM <=0.25 SENS... Sensitive    NITROFURANTOIN <=16 SENSIT... Sensitive    PIP/TAZO <=4 SENSITIVE  Sensitive    TRIMETH/SULFA <=20 SENSIT... Sensitive        Discharge Instructions: Discharge Instructions    Call MD for:  difficulty breathing, headache or visual disturbances   Complete by:  As directed    Call MD for:  extreme fatigue   Complete by:  As directed    Call MD for:  hives   Complete by:  As directed    Call MD for:  persistant dizziness or light-headedness   Complete by:  As directed    Call MD for:  persistant nausea and vomiting   Complete by:  As directed    Call MD for:  redness, tenderness, or signs of infection (pain,  swelling, redness, odor or green/yellow discharge around incision site)   Complete by:  As directed    Call MD for:  severe uncontrolled pain   Complete by:  As directed    Call MD for:  temperature >100.4   Complete by:  As directed    Diet - low sodium heart healthy   Complete by:  As directed    Diet - low sodium heart healthy   Complete by:  As directed    Discharge instructions   Complete by:  As directed    Ms. Clarin, for your dizziness we have ruled out stroke and other brain pathology as a cause which is great news.  You were severely dehydrated when you came in.  You responded very well to IV fluids and you were no longer orthostatic.  Your baseline heart rate is on the lower side of normal.  Because of this you will need to make sure to take your time when sitting up and getting up from a seated position or when laying down.  Additionally please try and decrease your caffeine intake as we discussed.  Try and drink mostly water to help keep yourself hydrated.  Additionally, your urine culture came back positive for E. Coli indicating you have a urinary tract infection.  I have prescribed you an antibiotic that you will take for one week.  We have an appointment for you in our clinic to check in and see how you're doing your appointment is on 25 June at 10:15am.   Increase activity slowly   Complete by:  As directed    Increase activity slowly   Complete by:  As directed       Signed: Katherine Roan, MD 11/29/2017, 4:15 PM

## 2017-11-27 NOTE — Progress Notes (Signed)
Retinal images done and transmitted.  

## 2017-11-27 NOTE — ED Notes (Signed)
Talking on phone with family,

## 2017-11-27 NOTE — ED Triage Notes (Signed)
Pt was recently discharged following an admission for anemia requiring 3 blood transfusions. Pt was at a cookout today, start having increased dizziness when standing after sitting. Reports similar symptoms as previous admission. Hx of bleeding stomach ulcers, reports they weren't bleeding when her scope was done during the last admission

## 2017-11-27 NOTE — ED Notes (Signed)
Pt c/o dizziness between position changes. While standing, pt c/o increased dizziness "like the room is spinning." Pt noted to be swaying side to side, does not appear steady enough to ambulate at this time

## 2017-11-27 NOTE — H&P (Addendum)
Date: 11/27/2017               Patient Name:  [name] MRN: [id number]  DOB: 22-Sep-1950 Age / Sex: 67 y.o., female   PCP: Lars Mage, MD         Medical Service: Internal Medicine Teaching Service         Attending Physician: Dr. Evette Doffing, Mallie Mussel, *    First Contact: Dr. Shan Levans Pager: [phone]  Second Contact: Dr. Hetty Ely Pager: [phone]       After Hours (After 5p/  First Contact Pager: [phone]  weekends / holidays): Second Contact Pager: [phone]   Chief Complaint: Dizziness  History of Present Illness: Patient is a 67 yo F with a pmhx of HTN, HLD, Type II DM, TIA, PUD, and tobacco abuse who presents today for evaluation of dizziness. Patient was recently hospitalized 5/27 - 5/29 for acute on chronic anemia secondary to iron and vitamin B12 deficiency requiring 3 units of pRBCs and IV iron. She underwent EGD on 5/28 that revealed 2 nonbleeding gastric ulcers. She was discharged on iron and B12 supplements and instructed to avoid NSAIDs. Patient reports she has been persistently dizzy since her discharge two weeks ago. Her symptoms acutely worsened yesterday evening while she was outside barbecuing. She bent down to close the gate on her deck to keep her dogs out when she felt very lightheaded and almost fell down the stairs. She called the [facility] after hours pager and was instructed to present to the ED for evaluation. She describes dizziness and lightheadedness with postural changes, however is asymptomatic when lying still. Reports these symptoms are exactly like her symptoms from prior hospitalization when she was found to be anemic requiring transfusion. On ROS she denies chest pain, SOB, cough, fevers, dysuria, N/V/D, abdominal pain, melena, hematochezia, hematemesis, and decreased po intake.   On arrival to the ED, patient was hypotensive with BP 94/44, HR 72, RR 20, and oxygen 92% on RA. Orthostatic vitals were positive (lying BP 149/67, HR 80 -> sitting BP 130/85, HR  85 -> standing BP 128/58, HR 84) and patient became symptomatic with both sitting up and standing. Labs were notable for hemoglobin of 9.0, stable from 9.0 at recent discharge and 9.7 in clinic three days ago. BMP with normal sodium 137, potassium 4.0, and AKI with Scr 1.46 from normal baseline (0.8). Troponin was < 0.03. EKG was NSR with non specific T wave changes in the lateral leads, unchanged from prior tracing. UA demonstrated large hematuria, proteinuria, positive nitrites, large leukocytes, and no bacteria. Lactic acid was 0.71. CXR was negative for acute processes. CT head negative for acute changes. She received 1.5L of NS in the ED with resolution of her orthostatic vitals, but continued to experience dizziness. She was given 2g IV ceftriaxone for possible UTI.    Meds:  Current Meds  Medication Sig  . albuterol (PROVENTIL HFA;VENTOLIN HFA) 108 (90 Base) MCG/ACT inhaler Inhale 1-2 puffs into the lungs every 6 (six) hours as needed for wheezing or shortness of breath.  Marland Kitchen aspirin 81 MG tablet Take 81 mg by mouth daily.   . ferrous sulfate 325 (65 FE) MG tablet Take 1 tablet (325 mg total) by mouth every other day.  . fluticasone (FLONASE) 50 MCG/ACT nasal spray Place 1 spray into both nostrils as needed for allergies or rhinitis.   Marland Kitchen gabapentin (NEURONTIN) 600 MG tablet Take 600 mg by mouth 3 (three) times daily.  Marland Kitchen glipiZIDE (GLUCOTROL XL) 5  MG 24 hr tablet Take 1 tablet (5 mg total) by mouth daily with breakfast. (Patient taking differently: Take 5 mg by mouth at bedtime. )  . pantoprazole (PROTONIX) 40 MG tablet Take 1 tablet (40 mg total) by mouth daily.  . rosuvastatin (CRESTOR) 40 MG tablet Take 1 tablet (40 mg total) by mouth daily.  . sertraline (ZOLOFT) 100 MG tablet TAKE 1 TABLET(100 MG) BY MOUTH DAILY  . sitaGLIPtin-metformin (JANUMET) 50-1000 MG tablet Take 1 tablet by mouth daily.  . vitamin B-12 1000 MCG tablet Take 1 tablet (1,000 mcg total) by mouth daily.      Allergies: Allergies as of 11/26/2017 - Review Complete 11/24/2017  Allergen Reaction Noted  . Anesthesia s-i-60 Other (See Comments) 01/20/2017  . Flexeril [cyclobenzaprine] Other (See Comments) 08/12/2012  . Soma [carisoprodol] Itching and Rash 05/13/2012   Past Medical History:  Diagnosis Date  . Allergy   . Anemia   . Arthritis    "left knee" (09/08/2015)  . Asthma   . Brachial plexus disorders   . CHF (congestive heart failure) ([place])   . Chronic pain    neck pain, headache, neuropathy  . COPD (chronic obstructive pulmonary disease) ([place])    SEES ONLY DR. PATEL   . Depression    "when my husband passed in 2013"  . Gastric ulcer   . GERD (gastroesophageal reflux disease)   . Hypertension   . Hypertriglyceridemia   . Migraine    "monthly" (09/08/2015)  . Neuromuscular disorder (H[facility])    neuropathy  . Puncture wound of foot, right 05/17/2012   Tetanus shot 3 yrs ago at [facility] in [place], per pt report   . Stroke ([place])    TIAS    IN [place]   4 YRS AGO   . Type II diabetes mellitus ([place]) 2010   diagnosed around 2010, only ever on metformin    Family History:  Family History  Problem Relation Age of Onset  . Hyperlipidemia Mother   . Heart attack Father 68  . Hypertension Father   . Cancer Paternal Grandfather        Lung cancer  . Cancer Maternal Grandmother   . Heart attack Maternal Grandmother   . Colon cancer Neg Hx   . Colon polyps Neg Hx   . Esophageal cancer Neg Hx   . Rectal cancer Neg Hx   . Stomach cancer Neg Hx    Social History: Currently smokes 3 cigarettes a day. Has been smoking for 50 years, up to 1 PPD at most. Denies alcohol and illicit substance use.   Review of Systems: A complete ROS was negative except as per HPI.   Physical Exam: Blood pressure (!) 150/78, pulse 78, temperature 98.4 F (36.9 C), resp. rate 18, SpO2 99 %. Constitutional: NAD, appears comfortable HEENT: Atraumatic, normocephalic. PERRL, anicteric sclera.   Neck: Supple, trachea midline.  Cardiovascular: RRR, no murmurs, rubs, or gallops.  Pulmonary/Chest: CTAB, no wheezes, rales, or rhonchi.  Abdominal: Soft, non tender, non distended. +BS.  Extremities: Warm and well perfused.  No edema.  Neurological: A&Ox3, CN II - XII grossly intact. Bilateral strength and sensation to light touch intact and symmetric.  Skin: No rashes or erythema  Psychiatric: Normal mood and affect  EKG: personally reviewed my interpretation is NSR, non specific T wave changes in later leads unchanged from prior.   CXR: personally reviewed my interpretation is - negative chest xray.   Assessment & Plan by Problem:  67 yo F with  pmhx of HTN, HLD, Type II DM, TIA, PUD, and tobacco abuse presenting with worsening dizziness and orthostatic hypotension.   Dizziness: Hemoglobin is 9.0, unchanged from discharge two weeks ago. However will repeat a CBC now that she has been volume resuscitated. I suspect this is on going symptomatic anemia due to iron and vit B12 deficiency. She was seen in clinic on 6/10 and 6/15 for follow up. She was persistently symptomatic at those visits. Celiac panel including antigliadin abs, gliadin IgG, and tTG IgA were checked and all negative. Work up for pernicious anemia was also initiated and pending at this time. There is also some concern that metformin could be affecting absorption. Repeat ferritin was 32, up from 10 during her hospitalization two weeks ago. She also has chronic concomitant thrombocytopenia. Hemolytic anemia was ruled out last admission with normal haptoglobin, LDH, blood smear, and negative direct coombs.  -- Recheck CBC -- Recheck Vit B12 level  -- NS @ 125 x 12 hours -- Transfuse prn to maintain hgb > 7.0  -- Hold ASA 81 -- F/u Intrinsic factor Ab (6/14) -- Stop sitagliptin-metformin  -- Continue Protonix 40 mg daily  -- Continue ferrous sulfate 325 mg q QOD -- Continue Vitamin B-12 1000 mcg daily   AKI: Pre-renal vs.  ATN due to hypotension. S/p IVFs in ED.  -- Repeat BMP -- Monitor UOP   Microscopic Hematuria: Documented now on 3 sequential UAs.  -- Consider outpatient CT urography, urology referral for cystoscopy, and work up for gyn etiologies   HTN: Lisinopril and amlodipine were discontinued on 6/4 clinic visit due to soft BPs.  -- Monitor for now   HLD: -- Continue Crestor 40 mg qd  Type II DM: Last A1c was 5.6 three days ago, down from 7.8 four months prior. Possibly falsely low in the setting of anemia. She is prescribed Janumet and glipizide.  -- Holding Janumet  -- Continue glipizide 5 mg QHS -- CBG checks QID  HFpEF: Echo in 05/2017 with EF 65-70%, G2DD. Currently well compensated.  -- Monitor volume status after IVFs    FEN: S/p 1.5L NS in ED, NS @ 125 x 12 hours, replete lytes prn, carb mod diet VTE ppx: SCDs Code Status: PARTIAL (CRP and ACLS meds ok, DNI)   Dispo: Admit patient to Observation with expected length of stay less than 2 midnights.  SignedVelna Ochs, MD 11/27/2017, 7:06 AM  Pager: [phone]

## 2017-11-27 NOTE — ED Notes (Signed)
Updated Danelle Berry, pt's daughter, on plan of care per permission of pt

## 2017-11-27 NOTE — Progress Notes (Signed)
Telemetry called re 5 beat run of Vtach. MD made aware. Pt resting, no symptoms.  Skin assessment on admission - pt has ecchymosis to bilateral arms - reports she fell last week - left is more bruised than right. Pt also has multiple small scabs that she attributes to her small dog who nips her. These are all intact, no pinkness noted between scabs.Pt's sacrum is intact, no moisture associated damage noted. Pt has amputation of right great toe - site is c/d/i. There is a healing/healed area on her right foot, lateral to the fifth toe where an ulcer appears to have healed. There is no pinkness, there is callus noted, no pain or drainage, site is intact. Pt tells me she follows with a wound clinic. Pt also reports that she walks barefoot frequently. Educated pt on proper footwear. Pt will require further reinforcement of teaching.  Pt notes her headache is better - 4/10. IVF infusing at 125cc/hr. Instructed pt on fall precautions, pt verbalized understanding and taught back.

## 2017-11-27 NOTE — ED Provider Notes (Signed)
[facility] EMERGENCY DEPARTMENT Provider Note   CSN: [id number] Arrival date & time: 11/26/17  2345     History   Chief Complaint Chief Complaint  Patient presents with  . Dizziness    HPI [name] is a 67 y.o. female.  Patient presents to the emergency department with complaints of dizziness and weakness.  Patient reports that she has noticed today that when she stands up and walks she gets dizzy, weak and feels like she is going to pass out.  When this occurs she feels like she is short of breath.  She also gets a tingling sensation in her upper chest and into her jaw when this occurs.  Lying in the bed currently, however, she feels well, at her baseline.  Patient reports that she was hospitalized 3 weeks ago for symptomatic anemia.  She had ulcers in her stomach noted on endoscopy.  She reports that she received 3 units of packed red blood cells during her last hospitalization.  Prior to her hospitalization she had similar feelings to what she feels today.  She did not notice any melanotic stools or rectal bleeding prior to recent hospitalization, once again has not seen any evidence of bleeding.  She denies abdominal pain.     Past Medical History:  Diagnosis Date  . Allergy   . Anemia   . Arthritis    "left knee" (09/08/2015)  . Asthma   . Brachial plexus disorders   . CHF (congestive heart failure) ([place])   . Chronic pain    neck pain, headache, neuropathy  . COPD (chronic obstructive pulmonary disease) ([place])    SEES ONLY DR. PATEL   . Depression    "when my husband passed in 2013"  . Gastric ulcer   . GERD (gastroesophageal reflux disease)   . Hypertension   . Hypertriglyceridemia   . Migraine    "monthly" (09/08/2015)  . Neuromuscular disorder (HCC)    neuropathy  . Puncture wound of foot, right 05/17/2012   Tetanus shot 3 yrs ago at [facility] in [place], per pt report   . Stroke ([place])    TIAS    IN [place]   4 YRS AGO   . Type  II diabetes mellitus ([place]) 2010   diagnosed around 2010, only ever on metformin    Patient Active Problem List   Diagnosis Date Noted  . Symptomatic anemia 11/07/2017  . Pressure injury of skin 11/07/2017  . Acute gastric ulcer with hemorrhage   . Syncope 11/06/2017  . Healthcare maintenance 09/09/2017  . Allergic rhinitis 09/09/2017  . Major depressive disorder 07/21/2017  . Proximal muscle weakness 07/12/2017  . Dysuria 06/16/2017  . Demand ischemia ([place]) 06/16/2017  . Petechiae 06/16/2017  . AKI (acute kidney injury) ([place])   . Right foot pain 04/22/2017  . Screening breast examination 01/21/2017  . Blister (nonthermal) of right ring finger, initial encounter 01/21/2017  . History of total knee replacement, 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  . Neck muscle strain 08/19/2016  . Onychomycosis of multiple toenails with type 2 diabetes mellitus ([place]) 11/04/2015  . Skin lesions 11/04/2015  . Screening for colon cancer 11/04/2015  . Orthostatic syncope   . Obesity (BMI 30.0-34.9) 05/27/2015  . Fatigue 05/11/2015  . Leg ulcer, left ([place]) 03/05/2015  . Recurrent falls 03/05/2015  . Primary osteoarthritis of left knee 09/22/2014  . Esophageal reflux 07/18/2014  . Hyperlipidemia associated with type 2 diabetes mellitus ([place]) 02/26/2014  . Tobacco use disorder 07/22/2013  . Headache  06/28/2013  . Diabetic peripheral neuropathy associated with type 2 diabetes mellitus ([place]) 06/14/2013  . Hx-TIA (transient ischemic attack) 03/10/2013  . Mild depression ([place]) 12/13/2012  . Elevated LFTs 06/01/2012  . Diabetes mellitus type 2, controlled, with complications ([place]) 07/37/1062  . Hypertension 05/13/2012    Past Surgical History:  Procedure Laterality Date  . 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 ARTHRODESIS METATARSAL     RIGHT 5 TH   . BILATERAL SALPINGOOPHORECTOMY Bilateral  1986   "after hemtoma evacuations; had to cut me open"  . BIOPSY  11/07/2017   Procedure: BIOPSY;  Surgeon: Irene Shipper, MD;  Location: [facility] ENDOSCOPY;  Service: Endoscopy;;  . CARPAL TUNNEL RELEASE Bilateral   . COLONOSCOPY     10 + yrs ago- pt unsure- was in [place]- pt states  MD will not send records but colon was normal per pt.   Marland Kitchen DILATION AND CURETTAGE OF UTERUS  ~ 1982   S/P miscarriage  . ESOPHAGOGASTRODUODENOSCOPY (EGD) WITH PROPOFOL N/A 11/07/2017   Procedure: ESOPHAGOGASTRODUODENOSCOPY (EGD) WITH PROPOFOL;  Surgeon: Irene Shipper, MD;  Location: [facility] ENDOSCOPY;  Service: Endoscopy;  Laterality: N/A;  . HEMATOMA EVACUATION Right 1986 x 2   "OVARY; w/in 1 wk after hysterectomy"  . KNEE ARTHROSCOPY     LEFT  . LAPAROSCOPIC CHOLECYSTECTOMY    . SHOULDER ARTHROSCOPY W/ ROTATOR CUFF REPAIR Bilateral   . TONSILLECTOMY    . TOTAL KNEE ARTHROPLASTY Left 09/30/2016   Procedure: LEFT TOTAL KNEE ARTHROPLASTY;  Surgeon: Netta Cedars, MD;  Location: [facility];  Service: Orthopedics;  Laterality: Left;  . TUBAL LIGATION    . VAGINAL HYSTERECTOMY  1986     OB History   None      Home Medications    Prior to Admission medications   Medication Sig Start Date End Date Taking? Authorizing Provider  albuterol (PROVENTIL HFA;VENTOLIN HFA) 108 (90 Base) MCG/ACT inhaler Inhale 1-2 puffs into the lungs every 6 (six) hours as needed for wheezing or shortness of breath. 02/10/17  Yes Lucious Groves, DO  aspirin 81 MG tablet Take 81 mg by mouth daily.    Yes [provider]  ferrous sulfate 325 (65 FE) MG tablet Take 1 tablet (325 mg total) by mouth every other day. 11/10/17  Yes Jule Ser, DO  fluticasone (FLONASE) 50 MCG/ACT nasal spray Place 1 spray into both nostrils as needed for allergies or rhinitis.    Yes [provider]  gabapentin (NEURONTIN) 600 MG tablet Take 600 mg by mouth 3 (three) times daily.   Yes [provider]  glipiZIDE (GLUCOTROL XL) 5 MG 24 hr  tablet Take 1 tablet (5 mg total) by mouth daily with breakfast. Patient taking differently: Take 5 mg by mouth at bedtime.  02/10/17  Yes Lucious Groves, DO  pantoprazole (PROTONIX) 40 MG tablet Take 1 tablet (40 mg total) by mouth daily. 11/09/17  Yes Jule Ser, DO  rosuvastatin (CRESTOR) 40 MG tablet Take 1 tablet (40 mg total) by mouth daily. 02/08/17  Yes Bartholomew Crews, MD  sertraline (ZOLOFT) 100 MG tablet TAKE 1 TABLET(100 MG) BY MOUTH DAILY 10/17/17  Yes Chundi, Vahini, MD  sitaGLIPtin-metformin (JANUMET) 50-1000 MG tablet Take 1 tablet by mouth daily. 09/08/17 11/27/24 Yes Chundi, Vahini, MD  vitamin B-12 1000 MCG tablet Take 1 tablet (1,000 mcg total) by mouth daily.  11/10/17  Yes Jule Ser, DO  gabapentin (NEURONTIN) 400 MG capsule Take 1 capsule (400 mg total) by mouth 3 (three) times daily. Patient not taking: Reported on 11/27/2017 09/11/17 12/10/17  Lars Mage, MD  glucose blood (ONETOUCH VERIO) test strip Check blood sugar 3x/day 09/08/17   Lars Mage, MD    Family History Family History  Problem Relation Age of Onset  . Hyperlipidemia Mother   . Heart attack Father 65  . Hypertension Father   . Cancer Paternal Grandfather        Lung cancer  . Cancer Maternal Grandmother   . Heart attack Maternal Grandmother   . Colon cancer Neg Hx   . Colon polyps Neg Hx   . Esophageal cancer Neg Hx   . Rectal cancer Neg Hx   . Stomach cancer Neg Hx     Social History Social History   Tobacco Use  . Smoking status: Current Some Day Smoker    Packs/day: 0.10    Years: 47.00    Pack years: 4.70    Types: Cigarettes    Last attempt to quit: 08/11/2016    Years since quitting: 1.2  . Smokeless tobacco: Never Used  . Tobacco comment:  2 cigs per day  Substance Use Topics  . Alcohol use: No    Alcohol/week: 0.0 oz  . Drug use: No     Allergies   Anesthesia s-i-60; Flexeril [cyclobenzaprine]; and Soma [carisoprodol]   Review of Systems Review of Systems    Constitutional: Positive for fatigue.  Respiratory: Positive for shortness of breath.   Neurological: Positive for dizziness.  All other systems reviewed and are negative.    Physical Exam Updated Vital Signs BP (!) 150/78   Pulse 78   Temp 98.4 F (36.9 C)   Resp 18   SpO2 99%   Physical Exam  Constitutional: She is oriented to person, place, and time. She appears well-developed and well-nourished. No distress.  HENT:  Head: Normocephalic and atraumatic.  Right Ear: Hearing normal.  Left Ear: Hearing normal.  Nose: Nose normal.  Mouth/Throat: Oropharynx is clear and moist and mucous membranes are normal.  Eyes: Pupils are equal, round, and reactive to light. Conjunctivae and EOM are normal.  Neck: Normal range of motion. Neck supple.  Cardiovascular: Regular rhythm, S1 normal and S2 normal. Exam reveals no gallop and no friction rub.  No murmur heard. Pulmonary/Chest: Effort normal and breath sounds normal. No respiratory distress. She exhibits no tenderness.  Abdominal: Soft. Normal appearance and bowel sounds are normal. There is no hepatosplenomegaly. There is no tenderness. There is no rebound, no guarding, no tenderness at McBurney's point and negative Murphy's sign. No hernia.  Musculoskeletal: Normal range of motion.  Neurological: She is alert and oriented to person, place, and time. She has normal strength. No cranial nerve deficit or sensory deficit. Coordination normal. GCS eye subscore is 4. GCS verbal subscore is 5. GCS motor subscore is 6.  Skin: Skin is warm, dry and intact. No rash noted. No cyanosis.  Psychiatric: She has a normal mood and affect. Her speech is normal and behavior is normal. Thought content normal.  Nursing note and vitals reviewed.    ED Treatments / Results  Labs (all labs ordered are listed, but only abnormal results are displayed) Labs Reviewed  BASIC METABOLIC PANEL - Abnormal; Notable for the following components:      Result Value    CO2 20 (*)    Glucose, Bld 102 (*)  Creatinine, Ser 1.46 (*)    GFR calc non Af Amer 37 (*)    GFR calc Af Amer 43 (*)    All other components within normal limits  CBC - Abnormal; Notable for the following components:   RBC 3.02 (*)    Hemoglobin 9.0 (*)    HCT 30.7 (*)    MCV 101.7 (*)    MCHC 29.3 (*)    RDW 18.5 (*)    Platelets 139 (*)    All other components within normal limits  URINALYSIS, ROUTINE W REFLEX MICROSCOPIC - Abnormal; Notable for the following components:   APPearance HAZY (*)    Hgb urine dipstick LARGE (*)    Protein, ur 30 (*)    Nitrite POSITIVE (*)    Leukocytes, UA LARGE (*)    All other components within normal limits  URINE CULTURE  TROPONIN I  I-STAT CG4 LACTIC ACID, ED    EKG EKG Interpretation  Date/Time:  Monday November 27 2017 00:07:44 EDT Ventricular Rate:  76 PR Interval:    QRS Duration: 87 QT Interval:  389 QTC Calculation: 438 R Axis:   51 Text Interpretation:  Sinus rhythm Consider left atrial enlargement Nonspecific T abnormalities, lateral leads No significant change since last tracing Confirmed by Orpah Greek [phone]) on 11/27/2017 2:10:33 AM   Radiology Ct Head Wo Contrast  Result Date: 11/27/2017 CLINICAL DATA:  Recent discharge for anemia dimension. Increased dizziness when standing today. Episodic vertigo. EXAM: CT HEAD WITHOUT CONTRAST TECHNIQUE: Contiguous axial images were obtained from the base of the skull through the vertex without intravenous contrast. COMPARISON:  11/06/2017 FINDINGS: Brain: Mild cerebral atrophy. No ventricular dilatation. Patchy low-attenuation changes in the deep white matter consistent small vessel ischemia. Old lacunar infarcts in the basal ganglia. No mass effect or midline shift. No abnormal extra-axial fluid collections. Gray-white matter junctions are distinct. Basal cisterns are not effaced. No acute intracranial hemorrhage. Vascular: Intracranial arterial vascular calcifications are  present. Skull: Calvarium appears intact. No acute depressed skull fractures. Sinuses/Orbits: Mucosal thickening in the paranasal sinuses. No acute air-fluid levels. Mastoid air cells are clear. Other: None. IMPRESSION: No acute intracranial abnormalities. Mild chronic atrophy and small vessel ischemic changes. Electronically Signed   By: Lucienne Capers M.D.   On: 11/27/2017 06:32   Dg Chest Port 1 View  Result Date: 11/27/2017 CLINICAL DATA:  Shortness of breath tonight. EXAM: PORTABLE CHEST 1 VIEW COMPARISON:  Chest radiograph Nov 06, 2017 FINDINGS: Cardiac silhouette is upper limits of normal size. Mediastinal silhouette is not suspicious. Calcified aortic arch. No pleural effusions or focal consolidations. Trachea projects midline and there is no pneumothorax. Soft tissue planes and included osseous structures are non-suspicious. LEFT humeral head suture anchors. IMPRESSION: Borderline cardiomegaly.  No acute pulmonary process. Aortic Atherosclerosis (ICD10-I70.0). Electronically Signed   By: Elon Alas M.D.   On: 11/27/2017 01:32    Procedures Procedures (including critical care time)  Medications Ordered in ED Medications  sodium chloride 0.9 % bolus 500 mL (0 mLs Intravenous Stopped 11/27/17 0208)  sodium chloride 0.9 % bolus 1,000 mL (0 mLs Intravenous Stopped 11/27/17 0403)  cefTRIAXone (ROCEPHIN) 2 g in sodium chloride 0.9 % 100 mL IVPB (0 g Intravenous Stopped 11/27/17 0540)  ipratropium-albuterol (DUONEB) 0.5-2.5 (3) MG/3ML nebulizer solution 3 mL (3 mLs Nebulization Given 11/27/17 0501)  meclizine (ANTIVERT) tablet 25 mg (25 mg Oral Given 11/27/17 0606)     Initial Impression / Assessment and Plan / ED Course  I have reviewed  the triage vital signs and the nursing notes.  Pertinent labs & imaging results that were available during my care of the patient were reviewed by me and considered in my medical decision making (see chart for details).     Patient presents to the  emergency department for evaluation of generalized weakness and dizziness.  Patient reports that she was recently hospitalized for bleeding ulcers and required blood transfusion.  She had similar symptoms to what she is currently experiencing when she was hospitalized for symptomatic anemia.  She has not noticed any bleeding.  Patient's blood work reveals that her hemoglobin is at her baseline.  No evidence of ongoing bleeding or symptomatic anemia at this time.  Vital signs reveal orthostatic hypotension, however.  Patient was given IV fluids and her orthostatic hypotension has improved, however, she is still dizzy with standing.  She gets some vertiginous symptoms, ataxia with walking.  She does not have any focal findings on her neurologic exam.  Head CT has been added on and is unremarkable.  Patient still having difficulty with ambulation, will require hospitalization for further work-up.  Final Clinical Impressions(s) / ED Diagnoses   Final diagnoses:  Dizziness  Ataxia    ED Discharge Orders    None       Orpah Greek, MD 11/27/17 [phone]

## 2017-11-27 NOTE — ED Notes (Signed)
Dr. Evette Doffing  At [facility]

## 2017-11-28 ENCOUNTER — Inpatient Hospital Stay (HOSPITAL_COMMUNITY): Payer: Medicare Other

## 2017-11-28 DIAGNOSIS — E119 Type 2 diabetes mellitus without complications: Secondary | ICD-10-CM | POA: Diagnosis not present

## 2017-11-28 DIAGNOSIS — R3129 Other microscopic hematuria: Secondary | ICD-10-CM | POA: Diagnosis present

## 2017-11-28 DIAGNOSIS — N390 Urinary tract infection, site not specified: Secondary | ICD-10-CM | POA: Diagnosis present

## 2017-11-28 DIAGNOSIS — Z79899 Other long term (current) drug therapy: Secondary | ICD-10-CM | POA: Diagnosis not present

## 2017-11-28 DIAGNOSIS — F17210 Nicotine dependence, cigarettes, uncomplicated: Secondary | ICD-10-CM | POA: Diagnosis present

## 2017-11-28 DIAGNOSIS — D509 Iron deficiency anemia, unspecified: Secondary | ICD-10-CM | POA: Diagnosis present

## 2017-11-28 DIAGNOSIS — D696 Thrombocytopenia, unspecified: Secondary | ICD-10-CM | POA: Diagnosis present

## 2017-11-28 DIAGNOSIS — K219 Gastro-esophageal reflux disease without esophagitis: Secondary | ICD-10-CM | POA: Diagnosis present

## 2017-11-28 DIAGNOSIS — I5032 Chronic diastolic (congestive) heart failure: Secondary | ICD-10-CM | POA: Diagnosis present

## 2017-11-28 DIAGNOSIS — Z87891 Personal history of nicotine dependence: Secondary | ICD-10-CM | POA: Diagnosis not present

## 2017-11-28 DIAGNOSIS — Z8673 Personal history of transient ischemic attack (TIA), and cerebral infarction without residual deficits: Secondary | ICD-10-CM | POA: Diagnosis not present

## 2017-11-28 DIAGNOSIS — R51 Headache: Secondary | ICD-10-CM | POA: Diagnosis not present

## 2017-11-28 DIAGNOSIS — I6789 Other cerebrovascular disease: Secondary | ICD-10-CM | POA: Diagnosis not present

## 2017-11-28 DIAGNOSIS — R062 Wheezing: Secondary | ICD-10-CM | POA: Diagnosis present

## 2017-11-28 DIAGNOSIS — E1140 Type 2 diabetes mellitus with diabetic neuropathy, unspecified: Secondary | ICD-10-CM | POA: Diagnosis present

## 2017-11-28 DIAGNOSIS — B9620 Unspecified Escherichia coli [E. coli] as the cause of diseases classified elsewhere: Secondary | ICD-10-CM | POA: Diagnosis present

## 2017-11-28 DIAGNOSIS — I951 Orthostatic hypotension: Secondary | ICD-10-CM | POA: Diagnosis present

## 2017-11-28 DIAGNOSIS — N179 Acute kidney failure, unspecified: Secondary | ICD-10-CM | POA: Diagnosis present

## 2017-11-28 DIAGNOSIS — R42 Dizziness and giddiness: Secondary | ICD-10-CM | POA: Diagnosis present

## 2017-11-28 DIAGNOSIS — F329 Major depressive disorder, single episode, unspecified: Secondary | ICD-10-CM | POA: Diagnosis present

## 2017-11-28 DIAGNOSIS — I110 Hypertensive heart disease with heart failure: Secondary | ICD-10-CM | POA: Diagnosis present

## 2017-11-28 DIAGNOSIS — E860 Dehydration: Secondary | ICD-10-CM

## 2017-11-28 DIAGNOSIS — I5030 Unspecified diastolic (congestive) heart failure: Secondary | ICD-10-CM | POA: Diagnosis not present

## 2017-11-28 DIAGNOSIS — Z8711 Personal history of peptic ulcer disease: Secondary | ICD-10-CM | POA: Diagnosis not present

## 2017-11-28 DIAGNOSIS — E538 Deficiency of other specified B group vitamins: Secondary | ICD-10-CM | POA: Diagnosis present

## 2017-11-28 DIAGNOSIS — Z7984 Long term (current) use of oral hypoglycemic drugs: Secondary | ICD-10-CM | POA: Diagnosis not present

## 2017-11-28 DIAGNOSIS — E781 Pure hyperglyceridemia: Secondary | ICD-10-CM | POA: Diagnosis present

## 2017-11-28 DIAGNOSIS — E785 Hyperlipidemia, unspecified: Secondary | ICD-10-CM | POA: Diagnosis present

## 2017-11-28 DIAGNOSIS — H81399 Other peripheral vertigo, unspecified ear: Secondary | ICD-10-CM | POA: Diagnosis not present

## 2017-11-28 DIAGNOSIS — R001 Bradycardia, unspecified: Secondary | ICD-10-CM | POA: Diagnosis not present

## 2017-11-28 DIAGNOSIS — Z7951 Long term (current) use of inhaled steroids: Secondary | ICD-10-CM | POA: Diagnosis not present

## 2017-11-28 LAB — CBC
HCT: 32.1 % — ABNORMAL LOW (ref 36.0–46.0)
Hematocrit: 31 % — ABNORMAL LOW (ref 34.0–46.6)
Hemoglobin: 9.7 g/dL — ABNORMAL LOW (ref 11.1–15.9)
Hemoglobin: 9.4 g/dL — ABNORMAL LOW (ref 12.0–15.0)
MCH: 29.8 pg (ref 26.6–33.0)
MCH: 29.8 pg (ref 26.0–34.0)
MCHC: 31.3 g/dL — ABNORMAL LOW (ref 31.5–35.7)
MCHC: 29.3 g/dL — ABNORMAL LOW (ref 30.0–36.0)
MCV: 95 fL (ref 79–97)
MCV: 101.9 fL — ABNORMAL HIGH (ref 78.0–100.0)
PLATELETS: 109 10*3/uL — AB (ref 150–400)
Platelets: 182 10*3/uL (ref 150–450)
RBC: 3.26 x10E6/uL — ABNORMAL LOW (ref 3.77–5.28)
RBC: 3.15 MIL/uL — ABNORMAL LOW (ref 3.87–5.11)
RDW: 17.5 % — ABNORMAL HIGH (ref 12.3–15.4)
RDW: 18.5 % — AB (ref 11.5–15.5)
WBC: 6.2 10*3/uL (ref 3.4–10.8)
WBC: 4.6 10*3/uL (ref 4.0–10.5)

## 2017-11-28 LAB — GLIA (IGA/G) + TTG IGA
Antigliadin Abs, IgA: 4 units (ref 0–19)
Gliadin IgG: 3 units (ref 0–19)
Transglutaminase IgA: <2 U/mL (ref 0–3)

## 2017-11-28 LAB — BASIC METABOLIC PANEL
Anion gap: 6 (ref 5–15)
BUN: 14 mg/dL (ref 6–20)
CO2: 21 mmol/L — ABNORMAL LOW (ref 22–32)
CREATININE: 1.04 mg/dL — AB (ref 0.44–1.00)
Calcium: 8.8 mg/dL — ABNORMAL LOW (ref 8.9–10.3)
Chloride: 116 mmol/L — ABNORMAL HIGH (ref 101–111)
GFR calc Af Amer: >60 mL/min (ref >60)
GFR calc non Af Amer: 56 mL/min — ABNORMAL LOW (ref >60)
Glucose, Bld: 99 mg/dL (ref 65–99)
Potassium: 4.2 mmol/L (ref 3.5–5.1)
SODIUM: 143 mmol/L (ref 135–145)

## 2017-11-28 LAB — IRON AND TIBC
Iron Saturation: 10 % — ABNORMAL LOW (ref 15–55)
Iron: 39 ug/dL (ref 27–139)
Total Iron Binding Capacity: 373 ug/dL (ref 250–450)
UIBC: 334 ug/dL (ref 118–369)

## 2017-11-28 LAB — ENDOMYSIAL IGA ANTIBODY: Endomysial IgA: NEGATIVE

## 2017-11-28 LAB — FERRITIN: Ferritin: 32 ng/mL (ref 15–150)

## 2017-11-28 LAB — GLUCOSE, CAPILLARY
GLUCOSE-CAPILLARY: 122 mg/dL — AB (ref 65–99)
GLUCOSE-CAPILLARY: 158 mg/dL — AB (ref 65–99)
Glucose-Capillary: 81 mg/dL (ref 65–99)
Glucose-Capillary: 112 mg/dL — ABNORMAL HIGH (ref 65–99)

## 2017-11-28 MED ORDER — IPRATROPIUM-ALBUTEROL 0.5-2.5 (3) MG/3ML IN SOLN
3.0000 mL | Freq: Once | RESPIRATORY_TRACT | Status: AC
  Administered 2017-11-28: 3 mL via RESPIRATORY_TRACT
  Filled 2017-11-28: qty 3

## 2017-11-28 MED ORDER — GADOBENATE DIMEGLUMINE 529 MG/ML IV SOLN
18.0000 mL | Freq: Once | INTRAVENOUS | Status: AC
  Administered 2017-11-28: 18 mL via INTRAVENOUS

## 2017-11-28 NOTE — Progress Notes (Addendum)
Subjective: Pt reported some improvement in her headache and dizziness but overall she still feels dizzy standing up and getting around.  She does not feel short of breath and has not felt short of breath with ambulation.  Objective:  Vital signs in last 24 hours: Vitals:   11/27/17 1415 11/27/17 1534 11/27/17 2112 11/28/17 0536  BP: 135/65 136/69 (!) 149/71 130/63  Pulse: 67 61 (!) 54 (!) 56  Resp: 19 20 18 18   Temp:  98.2 F (36.8 C) 97.6 F (36.4 C) 98.2 F (36.8 C)  TempSrc:  Oral Oral Oral  SpO2: 96% 100% 99% 92%  Weight:  184 lb 1.4 oz (83.5 kg)    Height:  5\' 6"  (1.676 m)     Physical Exam  Constitutional: No distress.  Cardiovascular: Normal rate, regular rhythm and normal heart sounds. Exam reveals no gallop and no friction rub.  No murmur heard. Pulmonary/Chest: Effort normal. No respiratory distress. She has wheezes (mild diffuse). She has no rales. She exhibits no tenderness.  Abdominal: Soft. Bowel sounds are normal. She exhibits no distension and no mass. There is no tenderness. There is no rebound and no guarding.  Neurological: She is alert.  Skin: She is not diaphoretic.   BMP Latest Ref Rng & Units 11/28/2017 11/27/2017 11/27/2017  Glucose 65 - 99 mg/dL 99 206(H) 102(H)  BUN 6 - 20 mg/dL 14 24(H) 20  Creatinine 0.44 - 1.00 mg/dL 1.04(H) 1.30(H) 1.46(H)  BUN/Creat Ratio 12 - 28 - - -  Sodium 135 - 145 mmol/L 143 140 137  Potassium 3.5 - 5.1 mmol/L 4.2 4.2 4.0  Chloride 101 - 111 mmol/L 116(H) 112(H) 109  CO2 22 - 32 mmol/L 21(L) 21(L) 20(L)  Calcium 8.9 - 10.3 mg/dL 8.8(L) 8.5(L) 8.9   CBC Latest Ref Rng & Units 11/28/2017 11/27/2017 11/27/2017  WBC 4.0 - 10.5 K/uL 4.6 6.0 7.4  Hemoglobin 12.0 - 15.0 g/dL 9.4(L) 9.0(L) 9.0(L)  Hematocrit 36.0 - 46.0 % 32.1(L) 30.7(L) 30.7(L)  Platelets 150 - 400 K/uL 109(L) 120(L) 139(L)   Assessment/Plan:  Active Problems:   Dizziness  Dizziness/orthostasis: Hemoglobin stable at 9.0, pt reports not having much  appetite, drinks two large diet cokes per day and 3 bottles of water.  Reports frequent large volume urination at home. Additionally patient with chronic headaches worse over the last month. Some additional history by patient suggests dizziness not always brought on by change in position  -strict I's and O's -retest orthostatic vital signs after IVF resuscitation -MRI brain with contrast    AKI: resolved with IVF  -will continue to monitor renal function  Microscopic Hematuria: Seen on 3 prior UA's   -will need outpatient workup   Hx of HTN: lisinopril and amlodipine d/ced due to pt being normotensive without medication since last admission.  -presented with hypotension now fluid resuscitated, continue to monitor     Type II DM: Last A1c was 5.6 three days ago, down from 7.8 four months prior.  She is prescribed Janumet and glipizide.   -Holding Janumet  -Continue glipizide 5 mg QHS -CBG checks QID   HFpEF: Echo in 05/2017 with EF 65-70%, G2DD. She was volume down on presentation  -continue to monitor volume status   Wheezing: pt with wheezing on exam, no documented pulmonary disease history but prior smoker  -will treat with albuterol neb therapy Q6 PRN will schedule a duoneb treatment this afternoon.   Dispo: Anticipated discharge in approximately 1-2 day(s).   Katherine Roan, MD  11/28/2017, 7:12 AM Vickki Muff MD PGY-1 Internal Medicine Pager # [phone]

## 2017-11-28 NOTE — Progress Notes (Signed)
Internal Medicine Attending:   I saw and examined the patient. I reviewed the resident's note and I agree with the resident's findings and plan as documented in the resident's note.  Hospital day #2 with dehydration, presyncope, and orthostatic changes that are improving with IV fluids and supportive care. AKI is improved, renal function closer to normal. I think we can stop IV fluids right now. Because she is still having imbalance, ataxia, and dizziness we will pursue MRI brain to rule out posterior circulation CVA or mass.

## 2017-11-28 NOTE — Evaluation (Signed)
Physical Therapy Evaluation Patient Details Name: [name] MRN: [id number] DOB: 11/19/1950 Today's Date: 11/28/2017   History of Present Illness  Patient is a 67 yo F with a pmhx of HTN, HLD, Type II DM, TIA, PUD, and tobacco abuse who presents today for evaluation of dizziness. Patient was recently hospitalized 5/27 - 5/29 for acute on chronic anemia secondary to iron and vitamin B12 deficiency requiring 3 units of pRBCs and IV iron.  Workup for syncope, orthostasis versus exertional  Clinical Impression   Pt admitted with above diagnosis. Pt currently with functional limitations due to the deficits listed below (see PT Problem List). Presents with continued dizziness; Worth noting that pt had a ground-level fall posteriorly about a month ago and she did hit her head (has had a persistent headache ever since); Orthostatics taken and much improved compared to those taken at admission; Brief vestibular screen revealed difficulty with x1 VOR movements -- will ask for a full Vestibular Evaluation next session;  Pt will benefit from skilled PT to increase their independence and safety with mobility to allow discharge to the venue listed below.       Follow Up Recommendations Home health PT;Supervision for mobility/OOB(followed by Outpt PT for Vestibular Rehab)    Equipment Recommendations  None recommended by PT    Recommendations for Other Services Other (comment)(Vestibular Evaluation)     Precautions / Restrictions Precautions Precautions: Fall Precaution Comments: dizziness upon standing      Mobility  Bed Mobility Overal bed mobility: Modified Independent                Transfers Overall transfer level: Needs assistance Equipment used: None Transfers: Sit to/from Stand Sit to Stand: Supervision         General transfer comment: supervision for safety due to hx of dizziness  Ambulation/Gait Ambulation/Gait assistance: Supervision Gait Distance (Feet): 150  Feet Assistive device: None       General Gait Details: Patient with mild unsteadiness requiring supervision but no overt LOB. Decreased bilateral heel strike at initial contact (baseline due to poor sensation 2/2 peripheral neuropathy). Decreased reciprocal arm swing. Tends to reach out for hallway rail for support  Stairs            Wheelchair Mobility    Modified Rankin (Stroke Patients Only)       Balance Overall balance assessment: Needs assistance Sitting-balance support: No upper extremity supported;Feet supported Sitting balance-Leahy Scale: Normal     Standing balance support: During functional activity Standing balance-Leahy Scale: Fair                               Pertinent Vitals/Pain Pain Assessment: 0-10 Pain Score: 5  Pain Location: headache  Pain Descriptors / Indicators: Headache Pain Intervention(s): Monitored during session    Home Living Family/patient expects to be discharged to:: Private residence Living Arrangements: Children;Other relatives;Non-relatives/Friends Available Help at Discharge: Family;Available 24 hours/day Type of Home: House Home Access: Stairs to enter Entrance Stairs-Rails: None(L side post ) Entrance Stairs-Number of Steps: 3 Home Layout: Two level;Able to live on main level with bedroom/bathroom Home Equipment: Gilford Rile - 2 wheels;Wheelchair - manual;Bedside commode      Prior Function Level of Independence: Independent         Comments: Reports she tends to "wall" or "furniture walk" at home     Hand Dominance   Dominant Hand: Left    Extremity/Trunk Assessment   Upper Extremity Assessment  Upper Extremity Assessment: Overall WFL for tasks assessed    Lower Extremity Assessment Lower Extremity Assessment: Generalized weakness RLE Sensation: history of peripheral neuropathy LLE Sensation: history of peripheral neuropathy    Cervical / Trunk Assessment Cervical / Trunk Assessment: Kyphotic   Communication   Communication: No difficulties  Cognition Arousal/Alertness: Awake/alert Behavior During Therapy: WFL for tasks assessed/performed Overall Cognitive Status: Within Functional Limits for tasks assessed                                        General Comments      Exercises     Assessment/Plan    PT Assessment Patient needs continued PT services  PT Problem List Decreased strength;Decreased activity tolerance;Decreased balance;Decreased mobility;Decreased coordination;Decreased knowledge of use of DME;Decreased safety awareness;Decreased knowledge of precautions       PT Treatment Interventions DME instruction;Gait training;Functional mobility training;Stair training;Therapeutic activities;Therapeutic exercise;Balance training;Patient/family education    PT Goals (Current goals can be found in the Care Plan section)  Acute Rehab PT Goals Patient Stated Goal: home PT Goal Formulation: With patient Time For Goal Achievement: 12/12/17 Potential to Achieve Goals: Good    Frequency Min 3X/week   Barriers to discharge        Co-evaluation               AM-PAC PT "6 Clicks" Daily Activity  Outcome Measure Difficulty turning over in bed (including adjusting bedclothes, sheets and blankets)?: None Difficulty moving from lying on back to sitting on the side of the bed? : None Difficulty sitting down on and standing up from a chair with arms (e.g., wheelchair, bedside commode, etc,.)?: A Little Help needed moving to and from a bed to chair (including a wheelchair)?: None Help needed walking in hospital room?: A Little Help needed climbing 3-5 steps with a railing? : A Little 6 Click Score: 21    End of Session Equipment Utilized During Treatment: Gait belt Activity Tolerance: Patient tolerated treatment well Patient left: with call bell/phone within reach;in bed Nurse Communication: Mobility status PT Visit Diagnosis: Unsteadiness on feet  (R26.81);Dizziness and giddiness (R42)    Time: 7989-2119 PT Time Calculation (min) (ACUTE ONLY): 28 min   Charges:   PT Evaluation $PT Eval Moderate Complexity: 1 Mod PT Treatments $Gait Training: 8-22 mins   PT G Codes:        Roney Marion, PT  Acute Rehabilitation Services Pager [phone] Office [phone]   Colletta [place] 11/28/2017, 4:42 PM

## 2017-11-29 DIAGNOSIS — R001 Bradycardia, unspecified: Secondary | ICD-10-CM

## 2017-11-29 DIAGNOSIS — B9620 Unspecified Escherichia coli [E. coli] as the cause of diseases classified elsewhere: Secondary | ICD-10-CM

## 2017-11-29 DIAGNOSIS — N390 Urinary tract infection, site not specified: Secondary | ICD-10-CM

## 2017-11-29 LAB — CBC
HEMATOCRIT: 33.9 % — AB (ref 36.0–46.0)
HEMOGLOBIN: 10 g/dL — AB (ref 12.0–15.0)
MCH: 29.5 pg (ref 26.0–34.0)
MCHC: 29.5 g/dL — ABNORMAL LOW (ref 30.0–36.0)
MCV: 100 fL (ref 78.0–100.0)
Platelets: 121 10*3/uL — ABNORMAL LOW (ref 150–400)
RBC: 3.39 MIL/uL — AB (ref 3.87–5.11)
RDW: 18.4 % — ABNORMAL HIGH (ref 11.5–15.5)
WBC: 4.3 10*3/uL (ref 4.0–10.5)

## 2017-11-29 LAB — URINE CULTURE: Culture: >=100000 — AB

## 2017-11-29 LAB — GLUCOSE, CAPILLARY
GLUCOSE-CAPILLARY: 270 mg/dL — AB (ref 65–99)
Glucose-Capillary: 101 mg/dL — ABNORMAL HIGH (ref 65–99)

## 2017-11-29 LAB — BASIC METABOLIC PANEL
ANION GAP: 4 — AB (ref 5–15)
BUN: 14 mg/dL (ref 6–20)
CALCIUM: 9.4 mg/dL (ref 8.9–10.3)
CO2: 24 mmol/L (ref 22–32)
Chloride: 112 mmol/L — ABNORMAL HIGH (ref 101–111)
Creatinine, Ser: 1.09 mg/dL — ABNORMAL HIGH (ref 0.44–1.00)
GFR calc non Af Amer: 52 mL/min — ABNORMAL LOW (ref >60)
Glucose, Bld: 104 mg/dL — ABNORMAL HIGH (ref 65–99)
POTASSIUM: 4.4 mmol/L (ref 3.5–5.1)
Sodium: 140 mmol/L (ref 135–145)

## 2017-11-29 MED ORDER — CEPHALEXIN 500 MG PO CAPS: 500.0000 mg | ORAL_CAPSULE | Freq: Two times a day (BID) | ORAL | 0 refills | Status: DC

## 2017-11-29 MED ORDER — ORAL CARE MOUTH RINSE: 15.0000 mL | Freq: Two times a day (BID) | OROMUCOSAL | Status: DC

## 2017-11-29 NOTE — Progress Notes (Addendum)
Physical Therapy Treatment/Vestibular Assessment Patient Details Name: [name] MRN: [id number] DOB: 1950-09-07 Today's Date: 11/29/2017    History of Present Illness Patient is a 67 yo F with a pmhx of HTN, HLD, Type II DM, TIA, PUD, and tobacco abuse who presents today for evaluation of dizziness. Patient was recently hospitalized 5/27 - 5/29 for acute on chronic anemia secondary to iron and vitamin B12 deficiency requiring 3 units of pRBCs and IV iron.  Workup for syncope, orthostasis versus exertional    PT Comments    Pt does show signs of vestibular dysfunction with decreased gaze stability (+) sway in sitting and standing, rigid tracking, and most significantly left beating with left gaze nystagmus.  There was seemingly some direction changing nystagmus, but with MRI clear of any acute findings I would need more to point me towards a neurologic issue.  We initiated x 1 seated exercises for HEP (handout given, exercises reviewed) and I suggested OP f/u at [facility] as this is close to her home and they specialize in vestibular and balance training.  Pt is due to d/c home this PM.    Follow Up Recommendations  Home health PT;Supervision for mobility/OOB(followed by vestibular/balance training)     Equipment Recommendations  None recommended by PT    Recommendations for Other Services   NA     Precautions / Restrictions Precautions Precautions: Fall Precaution Comments: dizziness upon standing    Mobility  Bed Mobility Overal bed mobility: Modified Independent                Transfers Overall transfer level: Needs assistance Equipment used: None Transfers: Sit to/from Stand Sit to Stand: Supervision         General transfer comment: close supervision for safety as pt relied on lower legs supported on bed for stability, (+) sway in standing.          Balance Overall balance assessment: Needs assistance Sitting-balance support: No  upper extremity supported;Feet supported Sitting balance-Leahy Scale: Good Sitting balance - Comments: (+) sway in sitting at times dissipates, at times increases, not with regular pattern noticed   Standing balance support: No upper extremity supported Standing balance-Leahy Scale: Fair Standing balance comment: close supervision EOB with (+) sway with eyes open, pt stepped away from the bed and I had her close her eyes and sway increased, but she did not fall over.             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   Symptom Behavior  Type of Dizziness Imbalance  Frequency of Dizziness daily when sitting up and standing/on her feet  Duration of Dizziness continuous once sitting and on her feet  Aggravating Factors Supine to sit;Sit to stand  Relieving Factors Lying supine;Closing eyes  Occulomotor Exam  Occulomotor Alignment Normal  Spontaneous Absent  Gaze-induced Direction changing nystagmus (  strong left beat with left gaze, more subtle right beat R)  Smooth Pursuits Saccades  Saccades Poor trajectory  Comment Skew test (-)  Vestibulo-Occular Reflex  VOR 1 Head Only (x 1 viewing) both slow and symptomatic, vertical more symptomatic than horizontal  VOR Cancellation Normal (but pt had difficulty going quickly, very slow)  Comment HIT (+) for catch up saccades bil, but pt very guarded  Auditory   Comments grossly equal finger rub  Positional Testing  Dix-Hallpike  (nothing indicated to me that these were needed)  Orthostatics  Orthostatics Comment were tested by other staff (see vitals flow sheet).                        Cognition Arousal/Alertness: Awake/alert Behavior During Therapy: WFL for tasks assessed/performed Overall Cognitive Status: Within Functional Limits for tasks assessed                                        Exercises Other Exercises Other Exercises: x1 seated vertical and horizontal exercises given as well as handout of exercises, two "A" targets and address of Neuro[facility]habilitation center for vestibular/balance follow up. Pt [place] on [address] which is very close to this location for specialty care.         Pertinent Vitals/Pain Pain Assessment: No/denies pain           PT Goals (current goals can now be found in the care plan section) Acute Rehab PT Goals Patient Stated Goal: home Progress towards PT goals: Progressing toward goals    Frequency    Min 3X/week      PT Plan Current plan remains appropriate       AM-PAC PT "6 Clicks" Daily Activity  Outcome Measure  Difficulty turning over in bed (including adjusting bedclothes, sheets and blankets)?: None Difficulty moving from lying on back to sitting on the side of the bed? : None Difficulty sitting down on and standing up from a chair with arms (e.g., wheelchair, bedside commode, etc,.)?: A Little Help needed moving to and from a bed to chair (including a wheelchair)?: None Help needed walking in hospital room?: A Little Help needed climbing 3-5 steps with a railing? : A Little 6 Click Score: 21    End of Session   Activity Tolerance: Patient tolerated treatment well Patient left: in bed;with call bell/phone within reach Nurse Communication: Mobility status PT Visit Diagnosis: Unsteadiness on feet (R26.81);Dizziness and giddiness (R42)     Time:  1428-1510 PT Time Calculation (min) (ACUTE ONLY): 42 min  Charges:  $Therapeutic Exercise: 8-22 mins $Therapeutic Activity: 23-37 mins          Nuchem Grattan B. Fabens, [place], DPT [phone]            11/29/2017, 5:25 PM

## 2017-11-29 NOTE — Progress Notes (Signed)
2300: Handoff report received from RN. Pt resting in bed. Discussed plan of care for the shift; pt amenable to plan.  0000: Pt resting comfortably.  0400: Pt continues resting comfortably.  0700: Handoff report given to RN. No acute events overnight.

## 2017-11-29 NOTE — Care Management Note (Signed)
Case Management Note  Patient Details  Name: [name] MRN: [id number] Date of Birth: [birth year]  Subjective/Objective:  Pt admitted on 11/27/17 with worsening dizziness and orthostatic hypotension.  PTA, pt independent, lives at home with multiple family members.  Pt receiving HHPT from [facility] prior to admission.              Action/Plan: Pt medically stable for discharge home today with family, who are able to provide 24h assistance.  Received resumption order for HHPT to continue at dc, and pt is agreeable to continuation of services.  Notified [facility] of dc date.  Expected Discharge Date:  11/29/17               Expected Discharge Plan:  [place]  In-House Referral:     Discharge planning Services  CM Consult  Post Acute Care Choice:  Home Health, Resumption of Svcs/PTA Provider Choice offered to:  Patient  DME Arranged:    DME Agency:     HH Arranged:  PT [place]:  [place]  Status of Service:  Completed, signed off  If discussed at [place] of Stay Meetings, dates discussed:    Additional Comments:  Ella Bodo, RN 11/29/2017, 3:05 PM

## 2017-11-29 NOTE — Progress Notes (Addendum)
Subjective: Pt reported some improvement in her dizziness, she is still having some with getting up. She was able to work with PT yesterday and is eager to work with them today.    Objective:  Vital signs in last 24 hours: Vitals:   11/28/17 1450 11/28/17 2057 11/28/17 2148 11/29/17 0504  BP:  (!) 141/60 134/78 (!) 143/65  Pulse:  (!) 52 67 (!) 50  Resp:  18 18 18   Temp:  98.2 F (36.8 C) 97.9 F (36.6 C) 98.2 F (36.8 C)  TempSrc:  Oral Oral   SpO2: 96% 100% 98% 95%  Weight:      Height:       Physical Exam  Constitutional: No distress.  Cardiovascular: Regular rhythm and normal heart sounds. Bradycardia present. Exam reveals no gallop and no friction rub.  No murmur heard. Pulmonary/Chest: Effort normal. No respiratory distress. She has wheezes (mild diffuse much improved from day prior). She has no rales. She exhibits no tenderness.  Abdominal: Soft. Bowel sounds are normal. She exhibits no distension and no mass. There is no tenderness. There is no rebound and no guarding.  Neurological: She is alert.  Skin: She is not diaphoretic.   BMP Latest Ref Rng & Units 11/29/2017 11/28/2017 11/27/2017  Glucose 65 - 99 mg/dL 104(H) 99 206(H)  BUN 6 - 20 mg/dL 14 14 24(H)  Creatinine 0.44 - 1.00 mg/dL 1.09(H) 1.04(H) 1.30(H)  BUN/Creat Ratio 12 - 28 - - -  Sodium 135 - 145 mmol/L 140 143 140  Potassium 3.5 - 5.1 mmol/L 4.4 4.2 4.2  Chloride 101 - 111 mmol/L 112(H) 116(H) 112(H)  CO2 22 - 32 mmol/L 24 21(L) 21(L)  Calcium 8.9 - 10.3 mg/dL 9.4 8.8(L) 8.5(L)   CBC Latest Ref Rng & Units 11/29/2017 11/28/2017 11/27/2017  WBC 4.0 - 10.5 K/uL 4.3 4.6 6.0  Hemoglobin 12.0 - 15.0 g/dL 10.0(L) 9.4(L) 9.0(L)  Hematocrit 36.0 - 46.0 % 33.9(L) 32.1(L) 30.7(L)  Platelets 150 - 400 K/uL 121(L) 109(L) 120(L)   Assessment/Plan:  Active Problems:   Dizziness  Dizziness/orthostasis: Hemoglobin stable at 9.0, pt reports not having much appetite, drinks two large diet cokes per day and 3 bottles  of water.  Reports frequent large volume urination at home. Additionally patient with chronic headaches worse over the last month. Some additional history by patient suggests dizziness not always brought on by change in position but is difficult to discern.   -orthostatic vital signs yesterday with drastic improvement -MRI brain with contrast shows age related changes and reflects pts chronic vascular disease -reviewed telemetry pt consistently in sinus bradycardia may be contributing somewhat -continue repleting B12 level has improved   UTI: pt with complaint of polyuria, no dysuria, urinalysis nitrite pos, urine culture >100,000 cfu/mL E. Coli pansensitive  -will give 7 day course of keflex on discharge   AKI: resolved with IVF  -Cr 1.09 today will continue to monitor renal function on discharge  Microscopic Hematuria: Seen on 3 prior UA's   -will need outpatient workup   Hx of HTN: lisinopril and amlodipine d/ced due to pt being normotensive without medication since last admission.  -presented with hypotension now fluid resuscitated and mostly normotensive with mild hypertension would not add bp meds given orthostasis.      Type II DM: Last A1c was 5.6 three days ago, down from 7.8 four months prior.  She is prescribed Janumet and glipizide.   -Holding Janumet  -Continue glipizide 5 mg QHS -CBG checks QID  HFpEF: Echo in 05/2017 with EF 65-70%, G2DD. She was volume down on presentation  -euvolemic on exam today, continue to assess outpatient  Wheezing: pt with wheezing on exam, no documented pulmonary disease history but prior smoker  -wheezing improved today after breathing treatment yesterday afternoon, will need PFT's were ordered but never completed in 2015, has albuterol inhaler at home   Dispo: Anticipated discharge later today.   Katherine Roan, MD 11/29/2017, 8:35 AM Vickki Muff MD PGY-1 Internal Medicine Pager # [phone]

## 2017-11-29 NOTE — Progress Notes (Signed)
Patient awaiting daughter to bring clothes to be discharged

## 2017-11-30 DIAGNOSIS — I110 Hypertensive heart disease with heart failure: Secondary | ICD-10-CM | POA: Diagnosis not present

## 2017-11-30 DIAGNOSIS — R55 Syncope and collapse: Secondary | ICD-10-CM | POA: Diagnosis not present

## 2017-11-30 DIAGNOSIS — K250 Acute gastric ulcer with hemorrhage: Secondary | ICD-10-CM | POA: Diagnosis not present

## 2017-11-30 DIAGNOSIS — I5030 Unspecified diastolic (congestive) heart failure: Secondary | ICD-10-CM | POA: Diagnosis not present

## 2017-11-30 DIAGNOSIS — D649 Anemia, unspecified: Secondary | ICD-10-CM | POA: Diagnosis not present

## 2017-11-30 DIAGNOSIS — E1140 Type 2 diabetes mellitus with diabetic neuropathy, unspecified: Secondary | ICD-10-CM | POA: Diagnosis not present

## 2017-12-01 NOTE — Progress Notes (Signed)
Internal Medicine Clinic Attending  Case discussed with Dr. Maricela Bo  at the time of the visit.  We reviewed the resident's history and exam and pertinent patient test results.  I agree with the assessment, diagnosis, and plan of care documented in the resident's note.  Oda Kilts, MD

## 2017-12-04 ENCOUNTER — Telehealth: Payer: Self-pay | Admitting: Internal Medicine

## 2017-12-04 NOTE — Telephone Encounter (Signed)
Verbal auth given to [place] for PT once a week for 1 week then twice weekly for 2 weeks for vertigo, strengthening. Will route to PCP to see if we are in agreement. Hubbard Hartshorn, RN, BSN

## 2017-12-04 NOTE — Telephone Encounter (Signed)
I agree. Thank you.

## 2017-12-04 NOTE — Telephone Encounter (Signed)
[name] from [place] [phone] verbal order  Order physcial 1 week 1, 2 week 1, 2 week 2 left message

## 2017-12-05 ENCOUNTER — Ambulatory Visit (INDEPENDENT_AMBULATORY_CARE_PROVIDER_SITE_OTHER): Payer: Medicare Other | Admitting: Internal Medicine

## 2017-12-05 ENCOUNTER — Other Ambulatory Visit: Payer: Self-pay

## 2017-12-05 VITALS — Temp 98.3°F | Ht 66.5 in | Wt 181.1 lb

## 2017-12-05 DIAGNOSIS — N179 Acute kidney failure, unspecified: Secondary | ICD-10-CM

## 2017-12-05 DIAGNOSIS — R300 Dysuria: Secondary | ICD-10-CM

## 2017-12-05 DIAGNOSIS — Z6828 Body mass index (BMI) 28.0-28.9, adult: Secondary | ICD-10-CM | POA: Diagnosis not present

## 2017-12-05 DIAGNOSIS — E663 Overweight: Secondary | ICD-10-CM

## 2017-12-05 DIAGNOSIS — R3129 Other microscopic hematuria: Secondary | ICD-10-CM

## 2017-12-05 DIAGNOSIS — Z7982 Long term (current) use of aspirin: Secondary | ICD-10-CM

## 2017-12-05 DIAGNOSIS — Z7984 Long term (current) use of oral hypoglycemic drugs: Secondary | ICD-10-CM

## 2017-12-05 DIAGNOSIS — E118 Type 2 diabetes mellitus with unspecified complications: Secondary | ICD-10-CM | POA: Diagnosis not present

## 2017-12-05 DIAGNOSIS — F17210 Nicotine dependence, cigarettes, uncomplicated: Secondary | ICD-10-CM

## 2017-12-05 DIAGNOSIS — Z87440 Personal history of urinary (tract) infections: Secondary | ICD-10-CM

## 2017-12-05 DIAGNOSIS — R42 Dizziness and giddiness: Secondary | ICD-10-CM | POA: Diagnosis not present

## 2017-12-05 DIAGNOSIS — R3915 Urgency of urination: Secondary | ICD-10-CM

## 2017-12-05 NOTE — Patient Instructions (Addendum)
Ms. Fukuda,  Arloa Koh try to restrict your fluid intake at night and not having anything to drink after 8 PM. I will also get you set up for a sleep study as well.   Please stop taking the Glipizide.   Please schedule follow up with your PCP.

## 2017-12-05 NOTE — Assessment & Plan Note (Signed)
See HPI for full details  Plan: We will place referral for balance and vestibular therapy

## 2017-12-05 NOTE — Progress Notes (Signed)
CC: Hospital follow-up for dizziness  HPI:  [name] is a 67 y.o. female with a past medical history listed below here today for follow up of her recent hospitalization for dizziness from 11/26/2017 to 11/29/2017.  She initially presented with complaints of dizziness and was found to have positive orthostatics in the emergency room as well as an AKI.  She did have improvement in her symptoms with volume resuscitation with IV fluids.  She continued complaints of headache and did have an MRI brain with and without contrast that showed no abnormalities other than age-related cerebral atrophy and moderate chronic small vessel disease.  She was also noted to have a urinary tract infection that was asymptomatic at that time with cultures growing pansensitive E. coli.  She was discharged on a 1 week course of Keflex.  She did have continued symptoms upon standing and home health physical therapy was set up for her at discharge.  She returns today for follow-up.  She reports that she is doing much better.  However, she reports she still does have dizziness especially upon standing.  Her orthostatics remain negative today.  She does bring paperwork from her physical therapist at home who recommend balance and vestibular therapy referral.  She also notes that after hospitalization she has had some burning with urination as well as urgency with urination.  Upon further discussion she reports that her urgency issues only occur at night and she has no real issues during the day.  She reports that at night she does drink soft drinks (she does note diet).  She reports drinking these all night including sips upon waking overnight.  She reports waking up hourly overnight with urgency to use the bathroom and has a bedside commode so that she can make it to the bathroom.  She reports she does not have these issues during the day and does not stand up quickly to rush to the bathroom aside from overnight to her  bedside commode.  She has not had any falls since returning home.  Past Medical History:  Diagnosis Date  . Allergy   . Anemia   . Arthritis    "left knee" (09/08/2015)  . Asthma   . Brachial plexus disorders   . CHF (congestive heart failure) ([place])   . Chronic pain    neck pain, headache, neuropathy  . COPD (chronic obstructive pulmonary disease) ([place])    SEES ONLY DR. PATEL   . Depression    "when my husband passed in 2013"  . Gastric ulcer   . GERD (gastroesophageal reflux disease)   . Hypertension   . Hypertriglyceridemia   . Migraine    "monthly" (09/08/2015)  . Neuromuscular disorder ([facility])    neuropathy  . Puncture wound of foot, right 05/17/2012   Tetanus shot 3 yrs ago at [facility] in [place], per pt report   . Stroke ([place])    [facility]    IN [place]   4 YRS AGO   . Type II diabetes mellitus ([place]) 2010   diagnosed around 2010, only ever on metformin   Review of Systems:   Negative except as noted in HPI  Physical Exam:  Vitals:   12/05/17 1008  BP: (!) 145/76  Pulse: 75  Temp: 98.3 F (36.8 C)  TempSrc: Oral  SpO2: 100%  Weight: 181 lb 1.6 oz (82.1 kg)  Height: 5' 6.5" (1.689 m)   GENERAL- alert, co-operative, appears as stated age, not in any distress. HEENT- Atraumatic, normocephalic, oral  mucosa appears moist CARDIAC- RRR, no murmurs, rubs or gallops. RESP- Clear to auscultation bilaterally, no wheezes or crackles. EXTREMITIES- pulse 2+, symmetric, no pedal edema. SKIN- Warm, dry, No rash or lesion. PSYCH- Normal mood and affect, appropriate thought content and speech.   Assessment & Plan:   See Encounters Tab for problem based charting.  Patient discussed with Dr. Lynnae January

## 2017-12-05 NOTE — Assessment & Plan Note (Signed)
Will order sleep studies today.

## 2017-12-05 NOTE — Assessment & Plan Note (Signed)
We will recheck a BMP today.

## 2017-12-05 NOTE — Assessment & Plan Note (Addendum)
Patient with continued symptoms of burning with urination and urgency.  She denies any vaginal itching, hematuria, dryness.  She was just treated with a 7-day course of Keflex for pansensitive E. coli which she finished yesterday.  See HPI for full details.  Plan: We will check a UA today though have low suspicion for recurrent UTI as she was on appropriate antibiotics.  Additionally, she had some microscopic hematuria noted and we will assess for that as well.  I discussed her drinking habits and she will stop drinking any fluids after 8 PM at night.  Additionally, she has numerous risk factors for sleep apnea and she may be waking up overnight due to this and then having the urge to urinate upon waking and may not be waking up because of the need to urinate.  We will get a sleep study to assess.  ADDENDUM: He will return with persistent microscopic hematuria.  She also had 1+ protein and on labs on 6/17 she did have a low albumin of 3.0.  Will patient return for repeat sample to confirm as well as quantitative protein testing in her urine.  If she has significant proteinuria she will need referral to nephrology but if she has no significant proteinuria she will need to be set up with urology for her hematuria.  I called patient and left voicemail to call the clinic.

## 2017-12-05 NOTE — Assessment & Plan Note (Signed)
Patient's A1c at last check was 5.6.  She was taking Janumet 50-1000 daily.  This was discontinued during her hospitalization as it may have been contributing to her dizziness.  Glipizide was not mentioned on her most recent PCP visit but it did remain on her medication list and was continued at hospital discharge.  She denies any episodes of hypoglycemia.  Plan: I will discontinue her glipizide today as her A1c is well beyond goal and glipizide has a high risk of inducing hypoglycemia.

## 2017-12-06 ENCOUNTER — Telehealth: Payer: Self-pay | Admitting: Internal Medicine

## 2017-12-06 LAB — URINALYSIS, COMPLETE
Bilirubin, UA: NEGATIVE
Glucose, UA: NEGATIVE
Ketones, UA: NEGATIVE
Leukocytes, UA: NEGATIVE
Nitrite, UA: NEGATIVE
PH UA: 6.5 (ref 5.0–7.5)
Specific Gravity, UA: 1.012 (ref 1.005–1.030)
Urobilinogen, Ur: 0.2 mg/dL (ref 0.2–1.0)

## 2017-12-06 LAB — MICROSCOPIC EXAMINATION: Casts: NONE SEEN /lpf

## 2017-12-06 LAB — BMP8+ANION GAP
Anion Gap: 13 mmol/L (ref 10.0–18.0)
BUN/Creatinine Ratio: 10 — ABNORMAL LOW (ref 12–28)
BUN: 11 mg/dL (ref 8–27)
CALCIUM: 9.8 mg/dL (ref 8.7–10.3)
CO2: 21 mmol/L (ref 20–29)
Chloride: 110 mmol/L — ABNORMAL HIGH (ref 96–106)
Creatinine, Ser: 1.06 mg/dL — ABNORMAL HIGH (ref 0.57–1.00)
GFR, EST AFRICAN AMERICAN: 64 mL/min/{1.73_m2} (ref >59)
GFR, EST NON AFRICAN AMERICAN: 56 mL/min/{1.73_m2} — AB (ref >59)
Glucose: 117 mg/dL — ABNORMAL HIGH (ref 65–99)
Potassium: 5.2 mmol/L (ref 3.5–5.2)
Sodium: 144 mmol/L (ref 134–144)

## 2017-12-06 NOTE — Progress Notes (Signed)
Internal Medicine Clinic Attending  Case discussed with Dr. Boswell at the time of the visit.  We reviewed the resident's history and exam and pertinent patient test results.  I agree with the assessment, diagnosis, and plan of care documented in the resident's note.  

## 2017-12-06 NOTE — Telephone Encounter (Signed)
Thank you for following up Dr. Charlynn Grimes

## 2017-12-06 NOTE — Addendum Note (Signed)
Addended by: [place] Lions on: 12/06/2017 11:43 AM   Modules accepted: Orders

## 2017-12-06 NOTE — Telephone Encounter (Signed)
Attempted to call patient with results of her tests from yesterday; left voicemail to call the clinic.  Her renal function is back to her normal.  Her urine studies do not show any evidence of infection.  However, she does have persistent microscopic blood in her urine that needs to be further evaluated.  I would like for her to come back for a lab visit to repeat the urinalysis as well as to quantify the protein in her urine.  If she has significant proteinuria that she will need to be set up with nephrology for further investigation.  If if she has hematuria alone she will need to be seen by urology.

## 2017-12-07 ENCOUNTER — Other Ambulatory Visit: Payer: Self-pay | Admitting: Internal Medicine

## 2017-12-07 DIAGNOSIS — I10 Essential (primary) hypertension: Secondary | ICD-10-CM

## 2017-12-08 ENCOUNTER — Encounter: Payer: Self-pay | Admitting: Dietician

## 2017-12-08 ENCOUNTER — Other Ambulatory Visit: Payer: Self-pay | Admitting: Internal Medicine

## 2017-12-08 DIAGNOSIS — E118 Type 2 diabetes mellitus with unspecified complications: Secondary | ICD-10-CM

## 2017-12-08 NOTE — Telephone Encounter (Signed)
Janumet was discontinued at hospital visit and glipizide was also discontinued. The patient's glucose control will be checked at follow up. Thanks!  Sandip Power

## 2017-12-08 NOTE — Telephone Encounter (Signed)
Looks like amlodipine was held at previous acc visit due to the patient having dizziness. If the patient is asymptomatic and blood pressure is elevated, she may slowly resume it.

## 2017-12-08 NOTE — Telephone Encounter (Signed)
Pt called - no answer; left message to call the clinic. 

## 2017-12-08 NOTE — Telephone Encounter (Signed)
Not on current med list.

## 2017-12-08 NOTE — Telephone Encounter (Signed)
Please refuse refill. Thanks

## 2017-12-09 ENCOUNTER — Other Ambulatory Visit: Payer: Self-pay | Admitting: Internal Medicine

## 2017-12-11 NOTE — Telephone Encounter (Signed)
Called / talked to pt - stated she is no longer taking Amlodipine; her BP is ok and her pharmacy probably sent request as routine (unaware it has been discontinued). Please refuse refill. Thanks

## 2017-12-17 ENCOUNTER — Emergency Department (HOSPITAL_COMMUNITY): Payer: Medicare Other

## 2017-12-17 ENCOUNTER — Encounter (HOSPITAL_COMMUNITY): Payer: Self-pay | Admitting: *Deleted

## 2017-12-17 ENCOUNTER — Emergency Department (HOSPITAL_COMMUNITY)
Admission: EM | Admit: 2017-12-17 | Discharge: 2017-12-17 | Disposition: A | Discharge status: Home / Self Care | Payer: Medicare Other | Attending: Emergency Medicine | Admitting: Emergency Medicine

## 2017-12-17 ENCOUNTER — Other Ambulatory Visit: Payer: Self-pay

## 2017-12-17 DIAGNOSIS — I110 Hypertensive heart disease with heart failure: Secondary | ICD-10-CM | POA: Diagnosis not present

## 2017-12-17 DIAGNOSIS — Z96652 Presence of left artificial knee joint: Secondary | ICD-10-CM | POA: Diagnosis not present

## 2017-12-17 DIAGNOSIS — Z79899 Other long term (current) drug therapy: Secondary | ICD-10-CM | POA: Insufficient documentation

## 2017-12-17 DIAGNOSIS — Z7982 Long term (current) use of aspirin: Secondary | ICD-10-CM | POA: Insufficient documentation

## 2017-12-17 DIAGNOSIS — Z8673 Personal history of transient ischemic attack (TIA), and cerebral infarction without residual deficits: Secondary | ICD-10-CM | POA: Diagnosis not present

## 2017-12-17 DIAGNOSIS — R531 Weakness: Secondary | ICD-10-CM | POA: Diagnosis not present

## 2017-12-17 DIAGNOSIS — E119 Type 2 diabetes mellitus without complications: Secondary | ICD-10-CM | POA: Insufficient documentation

## 2017-12-17 DIAGNOSIS — F17210 Nicotine dependence, cigarettes, uncomplicated: Secondary | ICD-10-CM | POA: Insufficient documentation

## 2017-12-17 DIAGNOSIS — I509 Heart failure, unspecified: Secondary | ICD-10-CM | POA: Insufficient documentation

## 2017-12-17 DIAGNOSIS — G4489 Other headache syndrome: Secondary | ICD-10-CM | POA: Diagnosis not present

## 2017-12-17 LAB — URINALYSIS, ROUTINE W REFLEX MICROSCOPIC
Bacteria, UA: NONE SEEN
Bilirubin Urine: NEGATIVE
Ketones, ur: NEGATIVE mg/dL
Leukocytes, UA: NEGATIVE
Nitrite: NEGATIVE
PROTEIN: NEGATIVE mg/dL
SPECIFIC GRAVITY, URINE: 1.011 (ref 1.005–1.030)
pH: 6 (ref 5.0–8.0)

## 2017-12-17 LAB — COMPREHENSIVE METABOLIC PANEL
ALK PHOS: 66 U/L (ref 38–126)
ALT: 39 U/L (ref 0–44)
ANION GAP: 5 (ref 5–15)
AST: 40 U/L (ref 15–41)
Albumin: 3.3 g/dL — ABNORMAL LOW (ref 3.5–5.0)
BILIRUBIN TOTAL: 0.9 mg/dL (ref 0.3–1.2)
BUN: 13 mg/dL (ref 8–23)
CALCIUM: 8.8 mg/dL — AB (ref 8.9–10.3)
CO2: 24 mmol/L (ref 22–32)
CREATININE: 1.09 mg/dL — AB (ref 0.44–1.00)
Chloride: 109 mmol/L (ref 98–111)
GFR, EST NON AFRICAN AMERICAN: 52 mL/min — AB (ref >60)
Glucose, Bld: 202 mg/dL — ABNORMAL HIGH (ref 70–99)
Potassium: 5.6 mmol/L — ABNORMAL HIGH (ref 3.5–5.1)
Sodium: 138 mmol/L (ref 135–145)
TOTAL PROTEIN: 6.5 g/dL (ref 6.5–8.1)

## 2017-12-17 LAB — TYPE AND SCREEN
ABO/RH(D): A POS
Antibody Screen: NEGATIVE

## 2017-12-17 LAB — CBC WITH DIFFERENTIAL/PLATELET
Abs Immature Granulocytes: 0 10*3/uL (ref 0.0–0.1)
BASOS ABS: 0 10*3/uL (ref 0.0–0.1)
Basophils Relative: 1 %
EOS PCT: 13 %
Eosinophils Absolute: 0.6 10*3/uL (ref 0.0–0.7)
HEMATOCRIT: 35.4 % — AB (ref 36.0–46.0)
HEMOGLOBIN: 10.7 g/dL — AB (ref 12.0–15.0)
Immature Granulocytes: 0 %
LYMPHS ABS: 1.1 10*3/uL (ref 0.7–4.0)
LYMPHS PCT: 22 %
MCH: 30.5 pg (ref 26.0–34.0)
MCHC: 30.2 g/dL (ref 30.0–36.0)
MCV: 100.9 fL — AB (ref 78.0–100.0)
MONO ABS: 0.5 10*3/uL (ref 0.1–1.0)
Monocytes Relative: 10 %
NEUTROS ABS: 2.7 10*3/uL (ref 1.7–7.7)
Neutrophils Relative %: 54 %
Platelets: 110 10*3/uL — ABNORMAL LOW (ref 150–400)
RBC: 3.51 MIL/uL — AB (ref 3.87–5.11)
RDW: 15.9 % — ABNORMAL HIGH (ref 11.5–15.5)
WBC: 4.9 10*3/uL (ref 4.0–10.5)

## 2017-12-17 LAB — POC OCCULT BLOOD, ED: FECAL OCCULT BLD: NEGATIVE

## 2017-12-17 LAB — I-STAT TROPONIN, ED: TROPONIN I, POC: 0.01 ng/mL (ref 0.00–0.08)

## 2017-12-17 NOTE — ED Provider Notes (Signed)
[facility] EMERGENCY DEPARTMENT Provider Note   CSN: [id number] Arrival date & time: 12/17/17  1335     History   Chief Complaint Chief Complaint  Patient presents with  . Weakness    HPI [name] is a 67 y.o. female.  67 year old female with medical history as detailed below presents with complaint of generalized weakness.  Patient reports that this started today (earlier this morning).  She reports that she was watching treatment at home and fell asleep -even though she was not supposed to.  She denies focal weakness.  She denies fever, chest pain, shortness of breath, abdominal pain, or other specific complaint.  Of note, she was admitted for similar symptoms last month.  The history is provided by the patient and medical records.  Weakness  Primary symptoms include no focal weakness, no loss of sensation, no loss of balance, no speech change, no dizziness. This is a new problem. The current episode started 6 to 12 hours ago. The problem has not changed since onset.There was no focality noted. There has been no fever. Pertinent negatives include no shortness of breath, no chest pain, no vomiting and no altered mental status.    Past Medical History:  Diagnosis Date  . Allergy   . Anemia   . Arthritis    "left knee" (09/08/2015)  . Asthma   . Brachial plexus disorders   . CHF (congestive heart failure) ([place])   . Chronic pain    neck pain, headache, neuropathy  . COPD (chronic obstructive pulmonary disease) ([place])    SEES ONLY DR. PATEL   . Depression    "when my husband passed in 2013"  . Gastric ulcer   . GERD (gastroesophageal reflux disease)   . Hypertension   . Hypertriglyceridemia   . Migraine    "monthly" (09/08/2015)  . Neuromuscular disorder (HCC)    neuropathy  . Puncture wound of foot, right 05/17/2012   Tetanus shot 3 yrs ago at [facility] in [place], per pt report   . Stroke ([place])    TIAS    IN [place]   4 YRS AGO   . Type  II diabetes mellitus ([place]) 2010   diagnosed around 2010, only ever on metformin    Patient Active Problem List   Diagnosis Date Noted  . Dizziness 11/27/2017  . Symptomatic anemia 11/07/2017  . Pressure injury of skin 11/07/2017  . Acute gastric ulcer with hemorrhage   . Syncope 11/06/2017  . Healthcare maintenance 09/09/2017  . Allergic rhinitis 09/09/2017  . Major depressive disorder 07/21/2017  . Proximal muscle weakness 07/12/2017  . Dysuria 06/16/2017  . Demand ischemia ([place]) 06/16/2017  . Petechiae 06/16/2017  . AKI (acute kidney injury) ([place])   . Right foot pain 04/22/2017  . Screening breast examination 01/21/2017  . Blister (nonthermal) of right ring finger, initial encounter 01/21/2017  . History of total knee replacement, 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  . Neck muscle strain 08/19/2016  . Onychomycosis of multiple toenails with type 2 diabetes mellitus ([place]) 11/04/2015  . Skin lesions 11/04/2015  . Screening for colon cancer 11/04/2015  . Orthostatic syncope   . Overweight (BMI 25.0-29.9) 05/27/2015  . Fatigue 05/11/2015  . Leg ulcer, left ([place]) 03/05/2015  . Recurrent falls 03/05/2015  . Primary osteoarthritis of left knee 09/22/2014  . Esophageal reflux 07/18/2014  . Hyperlipidemia associated with type 2 diabetes mellitus ([place]) 02/26/2014  . Tobacco use disorder 07/22/2013  . Headache 06/28/2013  . Diabetic peripheral neuropathy associated with  type 2 diabetes mellitus ([place]) 06/14/2013  . Hx-TIA (transient ischemic attack) 03/10/2013  . Mild depression ([place]) 12/13/2012  . Elevated LFTs 06/01/2012  . Diabetes mellitus type 2, controlled, with complications ([place]) 31/54/0086  . Hypertension 05/13/2012    Past Surgical History:  Procedure Laterality Date  . 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 ARTHRODESIS METATARSAL     RIGHT 5 TH   . BILATERAL  SALPINGOOPHORECTOMY Bilateral 1986   "after hemtoma evacuations; had to cut me open"  . BIOPSY  11/07/2017   Procedure: BIOPSY;  Surgeon: Irene Shipper, MD;  Location[facility] ENDOSCOPY;  Service: Endoscopy;;  . CARPAL TUNNEL RELEASE Bilateral   . COLONOSCOPY     10 + yrs ago- pt unsure- was in [place]- pt states  MD will not send records but colon was normal per pt.   Marland Kitchen DILATION AND CURETTAGE OF UTERUS  ~ 1982   S/P miscarriage  . ESOPHAGOGASTRODUODENOSCOPY (EGD) WITH PROPOFOL N/A 11/07/2017   Procedure: ESOPHAGOGASTRODUODENOSCOPY (EGD) WITH PROPOFOL;  Surgeon: Irene Shipper, MD;  Location: [facility] ENDOSCOPY;  Service: Endoscopy;  Laterality: N/A;  . HEMATOMA EVACUATION Right 1986 x 2   "OVARY; w/in 1 wk after hysterectomy"  . KNEE ARTHROSCOPY     LEFT  . LAPAROSCOPIC CHOLECYSTECTOMY    . SHOULDER ARTHROSCOPY W/ ROTATOR CUFF REPAIR Bilateral   . TONSILLECTOMY    . TOTAL KNEE ARTHROPLASTY Left 09/30/2016   Procedure: LEFT TOTAL KNEE ARTHROPLASTY;  Surgeon: Netta Cedars, MD;  Location: [facility];  Service: Orthopedics;  Laterality: Left;  . TUBAL LIGATION    . VAGINAL HYSTERECTOMY  1986     OB History   None      Home Medications    Prior to Admission medications   Medication Sig Start Date End Date Taking? Authorizing Provider  albuterol (PROVENTIL HFA;VENTOLIN HFA) 108 (90 Base) MCG/ACT inhaler Inhale 1-2 puffs into the lungs every 6 (six) hours as needed for wheezing or shortness of breath. 02/10/17  Yes Lucious Groves, DO  aspirin 81 MG tablet Take 81 mg by mouth daily.    Yes [provider]  ferrous sulfate 325 (65 FE) MG tablet Take 1 tablet (325 mg total) by mouth every other day. 11/10/17  Yes Jule Ser, DO  gabapentin (NEURONTIN) 400 MG capsule Take 1 capsule (400 mg total) by mouth 3 (three) times daily. Patient taking differently: Take 600 mg by mouth 3 (three) times daily.  09/11/17 12/17/17 Yes Chundi, Vahini, MD  pantoprazole (PROTONIX) 40 MG tablet TAKE 1 TABLET(40  MG) BY MOUTH DAILY 12/11/17  Yes Chundi, Vahini, MD  rosuvastatin (CRESTOR) 40 MG tablet Take 1 tablet (40 mg total) by mouth daily. 02/08/17  Yes Bartholomew Crews, MD  sertraline (ZOLOFT) 100 MG tablet TAKE 1 TABLET(100 MG) BY MOUTH DAILY 10/17/17  Yes Chundi, Vahini, MD  vitamin B-12 1000 MCG tablet Take 1 tablet (1,000 mcg total) by mouth daily. 11/10/17  Yes Jule Ser, DO  glucose blood (ONETOUCH VERIO) test strip Check blood sugar 3x/day 09/08/17   Lars Mage, MD    Family History Family History  Problem Relation Age of Onset  . Hyperlipidemia Mother   . Heart attack Father 54  . Hypertension Father   . Cancer Paternal Grandfather        Lung cancer  . Cancer Maternal Grandmother   . Heart attack  Maternal Grandmother   . Colon cancer Neg Hx   . Colon polyps Neg Hx   . Esophageal cancer Neg Hx   . Rectal cancer Neg Hx   . Stomach cancer Neg Hx     Social History Social History   Tobacco Use  . Smoking status: Current Every Day Smoker    Packs/day: 0.12    Years: 49.00    Pack years: 5.88    Types: Cigarettes  . Smokeless tobacco: Never Used  Substance Use Topics  . Alcohol use: No    Alcohol/week: 0.0 oz  . Drug use: No     Allergies   Anesthesia s-i-60; Flexeril [cyclobenzaprine]; and Soma [carisoprodol]   Review of Systems Review of Systems  Respiratory: Negative for shortness of breath.   Cardiovascular: Negative for chest pain.  Gastrointestinal: Negative for vomiting.  Neurological: Positive for weakness. Negative for dizziness, speech change, focal weakness and loss of balance.  All other systems reviewed and are negative.    Physical Exam Updated Vital Signs BP 134/84   Pulse 60   Resp 16   Ht 5' 6.5" (1.689 m)   Wt 80.7 kg (178 lb)   SpO2 96%   BMI 28.30 kg/m   Physical Exam  Constitutional: She is oriented to person, place, and time. She appears well-developed and well-nourished. No distress.  HENT:  Head: Normocephalic and  atraumatic.  Mouth/Throat: Oropharynx is clear and moist.  Eyes: Pupils are equal, round, and reactive to light. Conjunctivae and EOM are normal.  Neck: Normal range of motion. Neck supple.  Cardiovascular: Normal rate, regular rhythm and normal heart sounds.  Pulmonary/Chest: Effort normal and breath sounds normal. No respiratory distress.  Abdominal: Soft. She exhibits no distension. There is no tenderness.  Musculoskeletal: Normal range of motion. She exhibits no edema or deformity.  Neurological: She is alert and oriented to person, place, and time.  AOX3 Normal speech No facial droop 5/5 strength to BUE/BLE  Skin: Skin is warm and dry.  Psychiatric: She has a normal mood and affect.  Nursing note and vitals reviewed.    ED Treatments / Results  Labs (all labs ordered are listed, but only abnormal results are displayed) Labs Reviewed  CBC WITH DIFFERENTIAL/PLATELET - Abnormal; Notable for the following components:      Result Value   RBC 3.51 (*)    Hemoglobin 10.7 (*)    HCT 35.4 (*)    MCV 100.9 (*)    RDW 15.9 (*)    Platelets 110 (*)    All other components within normal limits  COMPREHENSIVE METABOLIC PANEL - Abnormal; Notable for the following components:   Potassium 5.6 (*)    Glucose, Bld 202 (*)    Creatinine, Ser 1.09 (*)    Calcium 8.8 (*)    Albumin 3.3 (*)    GFR calc non Af Amer 52 (*)    All other components within normal limits  URINALYSIS, ROUTINE W REFLEX MICROSCOPIC  I-STAT TROPONIN, ED  POC OCCULT BLOOD, ED  TYPE AND SCREEN    EKG EKG Interpretation  Date/Time:  Sunday December 17 2017 13:49:06 EDT Ventricular Rate:  59 PR Interval:    QRS Duration: 88 QT Interval:  463 QTC Calculation: 459 R Axis:   14 Text Interpretation:  Sinus rhythm Abnormal T, consider ischemia, diffuse leads Borderline ST elevation, anterior leads Confirmed by Dene Gentry [phone]) on 12/17/2017 1:56:00 PM   Radiology Dg Chest Port 1 View  Result Date:  12/17/2017 CLINICAL  DATA:  Weakness and lethargy today. EXAM: PORTABLE CHEST 1 VIEW COMPARISON:  Single-view of the chest 11/27/2017 and 11/06/2017. PA and lateral chest 06/06/2017. FINDINGS: The lungs are clear. Heart size is normal. No pneumothorax or pleural effusion. No acute bony abnormality. Postoperative change left shoulder noted. IMPRESSION: No acute disease. Electronically Signed   By: Inge Rise M.D.   On: 12/17/2017 14:37    Procedures Procedures (including critical care time)  Medications Ordered in ED Medications - No data to display   Initial Impression / Assessment and Plan / ED Course  I have reviewed the triage vital signs and the nursing notes.  Pertinent labs & imaging results that were available during my care of the patient were reviewed by me and considered in my medical decision making (see chart for details).     MDM  Screen complete  Patient is presenting with complaint of generalized weakness. Symptoms are otherwise non-specific. Screening labs obtained thus far are without significant abnormality.  Guaiac is negative.  Troponin is negative.  Chest x-ray shows no acute process.   UA is pending.   Patient is neurologically intact.  CT of head was not obtained today.  Of note, she had a normal head CT/MRI of brain done in the middle of last month.  Dr. Zenia Resides aware of pending UA and Dispo.      Final Clinical Impressions(s) / ED Diagnoses   Final diagnoses:  Weakness    ED Discharge Orders    None       Valarie Merino, MD 12/17/17 [phone]

## 2017-12-17 NOTE — ED Triage Notes (Signed)
PT arrived via EMS from home where she lives with family. Pt reports she when to sleep while watching 2 young children that live in the home. Pt reported the family was up set with her because she fell a asleep.

## 2017-12-17 NOTE — ED Provider Notes (Signed)
Patient sent to me by Dr. Francia Greaves pending a urinalysis result which was negative.  Patient denies any headache at this time.  She has no gross focal neurological deficits at time of discharge.  States that she feels very sleepy.  However she is alert and oriented x4.  We discharged with her daughter and return precautions given.   Julia Leigh, MD 12/17/17 Jeri Lager

## 2017-12-20 ENCOUNTER — Ambulatory Visit (INDEPENDENT_AMBULATORY_CARE_PROVIDER_SITE_OTHER): Payer: Medicare Other | Admitting: Podiatry

## 2017-12-20 ENCOUNTER — Encounter: Payer: Self-pay | Admitting: Podiatry

## 2017-12-20 DIAGNOSIS — L84 Corns and callosities: Secondary | ICD-10-CM

## 2017-12-20 DIAGNOSIS — S98111S Complete traumatic amputation of right great toe, sequela: Secondary | ICD-10-CM

## 2017-12-20 DIAGNOSIS — E1149 Type 2 diabetes mellitus with other diabetic neurological complication: Secondary | ICD-10-CM

## 2017-12-20 NOTE — Progress Notes (Signed)
This patient presents to the office with chief complaint of a painful callus on the outside of her right foot.  She says that she is having severe pain and discomfort walking at this site.  She says that it is very painful and throbbing at night which keeps her from sleeping.  . She presents the office wearing sandals and not wearing any off loading padding.  She says she has diabetic shoes at home and has diabetic insoles  , but they are still painful for her when she walks.  . She presents the office today for continued evaluation and treatment of her painful right foot.  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 Patient had fifth metatarsal head right foot resected.  General Appearance  Alert, conversant and in no acute stress.  Vascular  Dorsalis pedis and posterior tibial  pulses are palpable  bilaterally.  Capillary return is within normal limits  bilaterally. Temperature is within normal limits  bilaterally.  Neurologic  Senn-Weinstein monofilament wire test within normal limits  bilaterally. Muscle power within normal limits bilaterally.  Nails Thick disfigured discolored nails with subungual debris  from hallux to fifth toes bilaterally. No evidence of bacterial infection or drainage bilaterally.  Orthopedic  No limitations of motion of motion feet .  No crepitus or effusions noted.  No bony pathology or digital deformities noted. Plantar flex fifth metatarsal right foot. . Pain is noted under the talus fifth metatarsal right foot, probably fibrosis.  Skin  normotropic skin with no porokeratosis noted bilaterally.  Callus noted sub-fifth metatarsal right foot with no evidence of any redness, swelling or infection.    Preulcerous callus right foot.  Diabetes  Amputation right foot first ray. Fibrosis right foot.  ROV  Debride callus.  Padding was applied to sub 5th metatarsal right foot.  To consider injection therapy in the future.  Patient states that she will  reappoint with Dr. Jacqualyn Posey in 2 weeks and consider the injection at that visit.  Gardiner Barefoot DPM

## 2018-01-05 ENCOUNTER — Encounter: Payer: Self-pay | Admitting: Internal Medicine

## 2018-01-05 ENCOUNTER — Ambulatory Visit (INDEPENDENT_AMBULATORY_CARE_PROVIDER_SITE_OTHER): Payer: Medicare Other | Admitting: Internal Medicine

## 2018-01-05 ENCOUNTER — Other Ambulatory Visit: Payer: Self-pay

## 2018-01-05 VITALS — BP 146/69 | HR 84 | Temp 98.4°F | Wt 182.5 lb

## 2018-01-05 DIAGNOSIS — Z79899 Other long term (current) drug therapy: Secondary | ICD-10-CM | POA: Diagnosis not present

## 2018-01-05 DIAGNOSIS — E1142 Type 2 diabetes mellitus with diabetic polyneuropathy: Secondary | ICD-10-CM | POA: Diagnosis not present

## 2018-01-05 DIAGNOSIS — E1165 Type 2 diabetes mellitus with hyperglycemia: Secondary | ICD-10-CM | POA: Diagnosis not present

## 2018-01-05 DIAGNOSIS — Z0000 Encounter for general adult medical examination without abnormal findings: Secondary | ICD-10-CM

## 2018-01-05 DIAGNOSIS — M79671 Pain in right foot: Secondary | ICD-10-CM

## 2018-01-05 DIAGNOSIS — N179 Acute kidney failure, unspecified: Secondary | ICD-10-CM | POA: Diagnosis not present

## 2018-01-05 DIAGNOSIS — Z7982 Long term (current) use of aspirin: Secondary | ICD-10-CM

## 2018-01-05 DIAGNOSIS — F17210 Nicotine dependence, cigarettes, uncomplicated: Secondary | ICD-10-CM | POA: Diagnosis not present

## 2018-01-05 DIAGNOSIS — R269 Unspecified abnormalities of gait and mobility: Secondary | ICD-10-CM | POA: Diagnosis not present

## 2018-01-05 DIAGNOSIS — E872 Acidosis: Secondary | ICD-10-CM | POA: Diagnosis not present

## 2018-01-05 DIAGNOSIS — I10 Essential (primary) hypertension: Secondary | ICD-10-CM

## 2018-01-05 DIAGNOSIS — E118 Type 2 diabetes mellitus with unspecified complications: Secondary | ICD-10-CM | POA: Diagnosis not present

## 2018-01-05 DIAGNOSIS — M7741 Metatarsalgia, right foot: Secondary | ICD-10-CM | POA: Diagnosis not present

## 2018-01-05 DIAGNOSIS — Z89411 Acquired absence of right great toe: Secondary | ICD-10-CM | POA: Diagnosis not present

## 2018-01-05 DIAGNOSIS — G8929 Other chronic pain: Secondary | ICD-10-CM

## 2018-01-05 DIAGNOSIS — F322 Major depressive disorder, single episode, severe without psychotic features: Secondary | ICD-10-CM

## 2018-01-05 DIAGNOSIS — D500 Iron deficiency anemia secondary to blood loss (chronic): Secondary | ICD-10-CM

## 2018-01-05 DIAGNOSIS — Z6829 Body mass index (BMI) 29.0-29.9, adult: Secondary | ICD-10-CM | POA: Diagnosis not present

## 2018-01-05 DIAGNOSIS — F339 Major depressive disorder, recurrent, unspecified: Secondary | ICD-10-CM

## 2018-01-05 DIAGNOSIS — E669 Obesity, unspecified: Secondary | ICD-10-CM

## 2018-01-05 DIAGNOSIS — K250 Acute gastric ulcer with hemorrhage: Secondary | ICD-10-CM | POA: Diagnosis not present

## 2018-01-05 LAB — GLUCOSE, CAPILLARY: Glucose-Capillary: 556 mg/dL (ref 70–99)

## 2018-01-05 MED ORDER — GABAPENTIN 300 MG PO CAPS: ORAL_CAPSULE | ORAL | 0 refills | Status: DC

## 2018-01-05 MED ORDER — LIRAGLUTIDE 18 MG/3ML ~~LOC~~ SOPN: PEN_INJECTOR | SUBCUTANEOUS | 1 refills | Status: DC

## 2018-01-05 MED ORDER — CYANOCOBALAMIN 1000 MCG PO TABS: 1000.0000 ug | ORAL_TABLET | Freq: Every day | ORAL | 1 refills | Status: AC

## 2018-01-05 MED ORDER — AMLODIPINE BESYLATE 5 MG PO TABS: 5.0000 mg | ORAL_TABLET | Freq: Every day | ORAL | 0 refills | Status: DC

## 2018-01-05 NOTE — Assessment & Plan Note (Addendum)
The patient states that she has continued burning sensation in her bilateral feet.  The patient is currently taking gabapentin 400 mg 3 times daily.  Recommended the patient to increase gabapentin dosage to 600 in the morning, 300 in the afternoon, 600 and nighttime.

## 2018-01-05 NOTE — Assessment & Plan Note (Signed)
The patient has a PHQ 9 score of 13 during today's visit.  He states that she has not had any significant mood changes and feels overall she is doing better.   -Recommend continue sertraline 100 mg daily.

## 2018-01-05 NOTE — Assessment & Plan Note (Signed)
The patient's blood pressure during this visit was 146/69.  The patient was previously taking amlodipine and lisinopril but this was discontinued in June 2019 due to the patient's having lightheadedness in the setting of anemia.  Assessment and plan Patient's blood pressure is not controlled and therefore will restart amlodipine 5 mg daily.

## 2018-01-05 NOTE — Assessment & Plan Note (Addendum)
Patient has iron deficiency anemia secondary to gastric ulcer.  She states that she continues to be tired and have weakness that can possibly be due to anemia.  Her last hemoglobin was 10.7 two weeks ago.  Assessment and plan -Ordered CBC and ferritin to evaluate anemia.  Will order for heme injections if the patient's iron level is too low.

## 2018-01-05 NOTE — Assessment & Plan Note (Signed)
The patient is due for a repeat EGD at this time.  Recommended the patient follow-up with gastroenterology for this.  Patient is currently taking ferrous sulfate 325 mg and vitamin B12 thousand milligrams daily.

## 2018-01-05 NOTE — Assessment & Plan Note (Signed)
The patient has had chronic pain in her right foot especially on the fifth metatarsal region.  The patient has had numerous procedures to the right foot including first toe amputation  and hammertoe release which has changed gait of the patient and likely caused the patient to place more pressure on the lateral aspect of her right foot.  The patient is being followed by podiatry and has return visit in 2 weeks.  At this time recommend the patient to be fitted into a shoe with proper orthotics to decrease her metatarsalgia.

## 2018-01-05 NOTE — Patient Instructions (Addendum)
It was a pleasure to see you today Ms. Morrisette. Please make the following changes:  -Start taking amlodipine 5 mg daily -Please start taking the Liraglutide 0.6 mg daily for the first week and then take 1.2 mg daily thereafter -Her gabapentin dose has been increased please start taking 600 mg in the morning, 300 mg in the afternoon, 600 mg at night -Please continue vitamin B12  If you have any questions or concerns, please call our clinic at [phone] between 9am-5pm and after hours call [phone] and ask for the internal medicine resident on call. If you feel you are having a medical emergency please call 911.   Thank you, we look forward to help you remain healthy!  Lars Mage, MD Internal Medicine PGY1

## 2018-01-05 NOTE — Assessment & Plan Note (Signed)
The patient's last A1c was 5.6 in June 2019.  The patient is not currently on any diabetes medications as previous Janumet and glipizide were discontinued due to dizziness and high risk for hypoglycemia respectively.  The patient has not been taking home blood glucose readings.  Random blood glucose reading of 556 at today's visit.  Assessment and plan The patient's blood glucose level is not controlled and she needs to be on an anti-hyperglycemic agent.  We will not restart metformin due to metformin possibly causing some degree of malabsorption.  We will also avoid glipizide and other sulfonylureas due to risk of hypertension.  Opted to start patient on [place] tied due to its vascular benefits, A1c reduction and reduction in nephropathy.  -Ordered BMP to confirm hyperglycemia and evaluate for possible acidosis and DKA

## 2018-01-05 NOTE — Progress Notes (Addendum)
CC: Fatigue and weakness  HPI:  [name] is a 67 y.o. female with hypertension, diabetes mellitus type 2, hx of acute gastric ulcer with hemorrhage, and depression who presents with symptoms of fatigue and weakness. Please see problem based charting for evaluation, assessment, and plan.  Past Medical History:  Diagnosis Date  . Allergy   . Anemia   . Arthritis    "left knee" (09/08/2015)  . Asthma   . Brachial plexus disorders   . CHF (congestive heart failure) ([place])   . Chronic pain    neck pain, headache, neuropathy  . COPD (chronic obstructive pulmonary disease) ([place])    SEES ONLY DR. PATEL   . Depression    "when my husband passed in 2013"  . Gastric ulcer   . GERD (gastroesophageal reflux disease)   . Hypertension   . Hypertriglyceridemia   . Migraine    "monthly" (09/08/2015)  . Neuromuscular disorder (H[facility])    neuropathy  . Puncture wound of foot, right 05/17/2012   Tetanus shot 3 yrs ago at [facility] in [place], per pt report   . Stroke ([place])    [facility]    IN [place]   4 YRS AGO   . Type II diabetes mellitus ([place]) 2010   diagnosed around 2010, only ever on metformin   Review of Systems:   Has fatigue and weakness Denies shortness of breath, chest pain, numbness, tingling  Physical Exam:  Vitals:   01/05/18 1329  BP: (!) 146/69  Pulse: 84  Temp: 98.4 F (36.9 C)  TempSrc: Oral  SpO2: 96%  Weight: 182 lb 8 oz (82.8 kg)   Physical Exam  Constitutional: She appears well-developed and well-nourished. No distress.  HENT:  Head: Normocephalic and atraumatic.  Eyes: Conjunctivae are normal.  Cardiovascular: Normal rate, regular rhythm and normal heart sounds.  Respiratory: Effort normal. No respiratory distress. She has wheezes.  GI: Soft. Bowel sounds are normal. She exhibits distension. There is no tenderness.  Musculoskeletal: She exhibits no edema.  Neurological: She is alert.  Skin: She is not diaphoretic. No erythema.  Psychiatric: She  has a normal mood and affect. Her behavior is normal. Judgment and thought content normal.    Assessment & Plan:   See Encounters Tab for problem based charting.  Diabetes mellitus type 2 The patient's last A1c was 5.6 in June 2019.  The patient is not currently on any diabetes medications as previous Janumet and glipizide were discontinued due to dizziness and high risk for hypoglycemia respectively.  The patient has not been taking home blood glucose readings.  Random blood glucose reading of 556 at today's visit.  Assessment and plan The patient's blood glucose level is not controlled and she needs to be on an anti-hyperglycemic agent.  We will not restart metformin due to metformin possibly causing some degree of malabsorption.  We will also avoid glipizide and other sulfonylureas due to risk of hypertension.  Opted to start patient on Rackley tied due to its vascular benefits, A1c reduction and reduction in nephropathy.  -Ordered BMP to confirm hyperglycemia and evaluate for possible acidosis and DKA  ADDENDUM The patient's bmp returned with a serum glucose of 557 and bicarb of 18 without an anion gap. She has an AKI of 1.42 which is elevated from baseline of 1.1.   Called patient to inform her of results. Told her to start using the liraglutide that was prescribed. Advised her to drink plenty of fluids and return for a lab  only visit for repeat bmp on Wednesday August 1st.   Diabetic neuropathy The patient states that she has continued burning sensation in her bilateral feet.  The patient is currently taking gabapentin 400 mg 3 times daily.  Recommended the patient to increase gabapentin dosage to 600 in the morning, 300 in the afternoon, 600 and nighttime.  Metatarsalgia of the  right foot The patient has had chronic pain in her right foot especially on the fifth metatarsal region.  The patient has had numerous procedures to the right foot including first toe amputation  and  hammertoe release which has changed gait of the patient and likely caused the patient to place more pressure on the lateral aspect of her right foot.  The patient is being followed by podiatry and has return visit in 2 weeks.  At this time recommend the patient to be fitted into a shoe with proper orthotics to decrease her metatarsalgia.  Hypertension The patient's blood pressure during this visit was 146/69.  The patient was previously taking amlodipine and lisinopril but this was discontinued in June 2019 due to the patient's having lightheadedness in the setting of anemia.  Assessment and plan Patient's blood pressure is not controlled and therefore will restart amlodipine 5 mg daily.  Acute gastric ulcer The patient is due for a repeat EGD at this time.  Recommended the patient follow-up with gastroenterology for this.  Patient is currently taking ferrous sulfate 325 mg and vitamin B12 thousand milligrams daily.  Iron Deficiency Anemia Patient has iron deficiency anemia secondary to gastric ulcer.  She states that she continues to be tired and have weakness that can possibly be due to anemia.  Her last hemoglobin was 10.7 two weeks ago.  Assessment and plan -Ordered CBC and ferritin to evaluate anemia.  Will order for heme injections if the patient's iron level is too low.  Major depressive disorder The patient has a PHQ 9 score of 13 during today's visit.  He states that she has not had any significant mood changes and feels overall she is doing better.   -Recommend continue sertraline 100 mg daily.   Patient discussed with Dr. Evette Doffing

## 2018-01-06 ENCOUNTER — Telehealth: Payer: Self-pay | Admitting: Internal Medicine

## 2018-01-06 LAB — BMP8+ANION GAP
Anion Gap: 16 mmol/L (ref 10.0–18.0)
BUN/Creatinine Ratio: 18 (ref 12–28)
BUN: 26 mg/dL (ref 8–27)
CO2: 18 mmol/L — ABNORMAL LOW (ref 20–29)
Calcium: 9.1 mg/dL (ref 8.7–10.3)
Chloride: 102 mmol/L (ref 96–106)
Creatinine, Ser: 1.42 mg/dL — ABNORMAL HIGH (ref 0.57–1.00)
GFR calc Af Amer: 45 mL/min/{1.73_m2} — ABNORMAL LOW (ref >59)
GFR calc non Af Amer: 39 mL/min/{1.73_m2} — ABNORMAL LOW (ref >59)
Glucose: 557 mg/dL (ref 65–99)
Potassium: 4.2 mmol/L (ref 3.5–5.2)
Sodium: 136 mmol/L (ref 134–144)

## 2018-01-06 LAB — CBC
Hematocrit: 41.9 % (ref 34.0–46.6)
Hemoglobin: 12.8 g/dL (ref 11.1–15.9)
MCH: 30.5 pg (ref 26.6–33.0)
MCHC: 30.5 g/dL — ABNORMAL LOW (ref 31.5–35.7)
MCV: 100 fL — ABNORMAL HIGH (ref 79–97)
Platelets: 134 10*3/uL — ABNORMAL LOW (ref 150–450)
RBC: 4.2 x10E6/uL (ref 3.77–5.28)
RDW: 14.6 % (ref 12.3–15.4)
WBC: 6.6 10*3/uL (ref 3.4–10.8)

## 2018-01-06 LAB — FERRITIN: Ferritin: 31 ng/mL (ref 15–150)

## 2018-01-06 NOTE — Telephone Encounter (Signed)
Received a call from [facility] regarding patient's BMP which shows glucose of 557, mild AKI with creatinine of 1.42, baseline around 1.06-1.09.  Bicarb of 18.  And a gap of 16. Called the patient, asked to recheck her blood sugar it was 365 fasting. Advised the patient to come to emergency room for further management. Patient refused to come to emergency room stating that she is feeling fine.  She denies any nausea, vomiting or abdominal pain.  No blurry vision.  She did took her first dose of Victoza last night.  Advised the patient to keep herself well-hydrated.  Keep checking her blood sugar multiple times during the day.  She should come to emergency room if she develops any nausea, vomiting, abdominal pain or blurry vision.  She should also come to emergency room if her blood sugar started going up again.  If she continue to feel better and her blood sugar continued to improve, she should come to the clinic on Monday morning for repeat BMP. Patient seems understanding.

## 2018-01-06 NOTE — Addendum Note (Signed)
Addended by: Lars Mage on: 01/06/2018 08:31 PM   Modules accepted: Orders

## 2018-01-07 ENCOUNTER — Encounter (HOSPITAL_COMMUNITY): Payer: Self-pay | Admitting: Emergency Medicine

## 2018-01-07 ENCOUNTER — Emergency Department (HOSPITAL_COMMUNITY): Payer: Medicare Other

## 2018-01-07 ENCOUNTER — Other Ambulatory Visit: Payer: Self-pay

## 2018-01-07 ENCOUNTER — Emergency Department (HOSPITAL_COMMUNITY)
Admission: EM | Admit: 2018-01-07 | Discharge: 2018-01-07 | Disposition: A | Discharge status: Home / Self Care | Payer: Medicare Other | Attending: Emergency Medicine | Admitting: Emergency Medicine

## 2018-01-07 DIAGNOSIS — S0083XA Contusion of other part of head, initial encounter: Secondary | ICD-10-CM

## 2018-01-07 DIAGNOSIS — R0902 Hypoxemia: Secondary | ICD-10-CM | POA: Diagnosis not present

## 2018-01-07 DIAGNOSIS — I509 Heart failure, unspecified: Secondary | ICD-10-CM | POA: Diagnosis not present

## 2018-01-07 DIAGNOSIS — F17210 Nicotine dependence, cigarettes, uncomplicated: Secondary | ICD-10-CM | POA: Insufficient documentation

## 2018-01-07 DIAGNOSIS — E1165 Type 2 diabetes mellitus with hyperglycemia: Secondary | ICD-10-CM | POA: Diagnosis not present

## 2018-01-07 DIAGNOSIS — Y92410 Unspecified street and highway as the place of occurrence of the external cause: Secondary | ICD-10-CM | POA: Diagnosis not present

## 2018-01-07 DIAGNOSIS — Z8673 Personal history of transient ischemic attack (TIA), and cerebral infarction without residual deficits: Secondary | ICD-10-CM | POA: Insufficient documentation

## 2018-01-07 DIAGNOSIS — S299XXA Unspecified injury of thorax, initial encounter: Secondary | ICD-10-CM | POA: Diagnosis not present

## 2018-01-07 DIAGNOSIS — M542 Cervicalgia: Secondary | ICD-10-CM | POA: Diagnosis not present

## 2018-01-07 DIAGNOSIS — E785 Hyperlipidemia, unspecified: Secondary | ICD-10-CM | POA: Insufficient documentation

## 2018-01-07 DIAGNOSIS — Z79899 Other long term (current) drug therapy: Secondary | ICD-10-CM | POA: Insufficient documentation

## 2018-01-07 DIAGNOSIS — I110 Hypertensive heart disease with heart failure: Secondary | ICD-10-CM | POA: Insufficient documentation

## 2018-01-07 DIAGNOSIS — S0993XA Unspecified injury of face, initial encounter: Secondary | ICD-10-CM | POA: Diagnosis not present

## 2018-01-07 DIAGNOSIS — S0181XA Laceration without foreign body of other part of head, initial encounter: Secondary | ICD-10-CM | POA: Insufficient documentation

## 2018-01-07 DIAGNOSIS — J449 Chronic obstructive pulmonary disease, unspecified: Secondary | ICD-10-CM | POA: Diagnosis not present

## 2018-01-07 DIAGNOSIS — Y939 Activity, unspecified: Secondary | ICD-10-CM | POA: Diagnosis not present

## 2018-01-07 DIAGNOSIS — R55 Syncope and collapse: Secondary | ICD-10-CM | POA: Insufficient documentation

## 2018-01-07 DIAGNOSIS — Z7982 Long term (current) use of aspirin: Secondary | ICD-10-CM | POA: Insufficient documentation

## 2018-01-07 DIAGNOSIS — E1140 Type 2 diabetes mellitus with diabetic neuropathy, unspecified: Secondary | ICD-10-CM | POA: Insufficient documentation

## 2018-01-07 DIAGNOSIS — R51 Headache: Secondary | ICD-10-CM | POA: Diagnosis not present

## 2018-01-07 DIAGNOSIS — Y998 Other external cause status: Secondary | ICD-10-CM | POA: Diagnosis not present

## 2018-01-07 DIAGNOSIS — S0990XA Unspecified injury of head, initial encounter: Secondary | ICD-10-CM | POA: Diagnosis not present

## 2018-01-07 DIAGNOSIS — Z794 Long term (current) use of insulin: Secondary | ICD-10-CM | POA: Insufficient documentation

## 2018-01-07 DIAGNOSIS — I10 Essential (primary) hypertension: Secondary | ICD-10-CM | POA: Diagnosis not present

## 2018-01-07 LAB — COMPREHENSIVE METABOLIC PANEL
ALBUMIN: 3.8 g/dL (ref 3.5–5.0)
ALT: 31 U/L (ref 0–44)
AST: 33 U/L (ref 15–41)
Alkaline Phosphatase: 73 U/L (ref 38–126)
Anion gap: 8 (ref 5–15)
BUN: 13 mg/dL (ref 8–23)
CO2: 25 mmol/L (ref 22–32)
CREATININE: 1.25 mg/dL — AB (ref 0.44–1.00)
Calcium: 9.5 mg/dL (ref 8.9–10.3)
Chloride: 106 mmol/L (ref 98–111)
GFR calc Af Amer: 52 mL/min — ABNORMAL LOW (ref >60)
GFR calc non Af Amer: 44 mL/min — ABNORMAL LOW (ref >60)
GLUCOSE: 254 mg/dL — AB (ref 70–99)
POTASSIUM: 3.8 mmol/L (ref 3.5–5.1)
SODIUM: 139 mmol/L (ref 135–145)
Total Bilirubin: 0.6 mg/dL (ref 0.3–1.2)
Total Protein: 6.6 g/dL (ref 6.5–8.1)

## 2018-01-07 LAB — CBC WITH DIFFERENTIAL/PLATELET
Abs Immature Granulocytes: 0 10*3/uL (ref 0.0–0.1)
BASOS PCT: 1 %
Basophils Absolute: 0.1 10*3/uL (ref 0.0–0.1)
EOS ABS: 0.4 10*3/uL (ref 0.0–0.7)
Eosinophils Relative: 5 %
HCT: 42.4 % (ref 36.0–46.0)
Hemoglobin: 13.1 g/dL (ref 12.0–15.0)
IMMATURE GRANULOCYTES: 0 %
LYMPHS ABS: 1.3 10*3/uL (ref 0.7–4.0)
Lymphocytes Relative: 14 %
MCH: 30.3 pg (ref 26.0–34.0)
MCHC: 30.9 g/dL (ref 30.0–36.0)
MCV: 98.1 fL (ref 78.0–100.0)
MONOS PCT: 7 %
Monocytes Absolute: 0.7 10*3/uL (ref 0.1–1.0)
NEUTROS ABS: 6.9 10*3/uL (ref 1.7–7.7)
NEUTROS PCT: 73 %
PLATELETS: 132 10*3/uL — AB (ref 150–400)
RBC: 4.32 MIL/uL (ref 3.87–5.11)
RDW: 13.7 % (ref 11.5–15.5)
WBC: 9.3 10*3/uL (ref 4.0–10.5)

## 2018-01-07 LAB — CBG MONITORING, ED: Glucose-Capillary: 243 mg/dL — ABNORMAL HIGH (ref 70–99)

## 2018-01-07 MED ORDER — MELOXICAM 15 MG PO TABS: 15.0000 mg | ORAL_TABLET | Freq: Every day | ORAL | 0 refills | Status: DC

## 2018-01-07 MED ORDER — LIDOCAINE HCL (PF) 1 % IJ SOLN
5.0000 mL | Freq: Once | INTRAMUSCULAR | Status: AC
  Administered 2018-01-07: 5 mL via INTRADERMAL
  Filled 2018-01-07: qty 5

## 2018-01-07 NOTE — Discharge Instructions (Signed)
Return to the emergency department immediately if you develop any of the following symptoms: You have numbness, tingling, or weakness in the arms or legs. You develop severe headaches not relieved with medicine. You have severe neck pain, especially tenderness in the middle of the back of your neck. You have changes in bowel or bladder control. There is increasing pain in any area of the body. You have shortness of breath, light-headedness, dizziness, or fainting. You have chest pain. You feel sick to your stomach (nauseous), throw up (vomit), or sweat. You have increasing abdominal discomfort. There is blood in your urine, stool, or vomit. You have pain in your shoulder (shoulder strap areas). You feel your symptoms are getting worse.  WOUND CARE Please have your stitches/staples removed in 7 days or sooner if you have concerns. You may do this at any available urgent care or at your primary care doctor's office.  Keep area clean and dry for 24 hours. Do not remove bandage, if applied.  After 24 hours, remove bandage and wash wound gently with mild soap and warm water. Reapply a new bandage after cleaning wound, if directed.  Continue daily cleansing with soap and water until stitches/staples are removed.  Do not apply any ointments or creams to the wound while stitches/staples are in place, as this may cause delayed healing.  Seek medical careif you experience any of the following signs of infection: Swelling, redness, pus drainage, streaking, fever >101.0 F  Seek care if you experience excessive bleeding that does not stop after 15-20 minutes of constant, firm pressure.  Department of Motor Vehicle [facility]) of [place] regulations for seizures - It is the patient?s responsibility to report the incidence of the seizure in the state of [place]. [place] has no statutory provision requiring physicians to report patients diagnosed with epilepsy or seizures to a central state agency.  The  recommended DMV regulation requirement for a driver in [place] for an individual with a seizure is that they be seizure-free for 6-12 months. However, the DMV may consider the following exceptions to this general rule where: (1) a physician-directed change in medication causes a seizure and the individual immediately resumes the previous therapy which controlled seizures; (2) there is a history of nocturnal seizures or seizures which do not involve loss of consciousness, loss of control of motor function, or loss of appropriate sensation and information process; and (3) an individual has a seizure disorder preceded by an aura (warning) lasting 2-3 minutes. While the [facility] may also give consideration to other unusual circumstances which may affect the general requirement that drivers be seizure-free for 6-12 months, interpretation of these circumstances and assignment of restrictions is at the discretion of the Medical Advisor. The DMV also considers compliance with medical therapy essential for safe driving. [facility] [place] Physician's Guide to [employer] (June, 1995 ed.)] The Department learns of an individual's condition by inquiring on the application form or renewal form, a physician's report to the [facility], an accident report or from correspondence from the individual. The person may be required to submit a Medical Report Form either annually or semi-annually.  Do not drive, swim, take baths or do any other activities that would be dangerous if you had another seizure.   Contact a health care provider if: You have another seizure. You have seizures more often. Your seizure symptoms change. You continue to have seizures with treatment. You have symptoms of an infection or illness. They might increase your risk of having a  seizure. Get help right away if: You have a seizure: That lasts longer than 5 minutes. That is different than previous seizures. That leaves you unable to speak or use a  part of your body. That makes it harder to breathe. After a head injury. You have: Multiple seizures in a row. Confusion or a severe headache right after a seizure. You are having seizures more often. You do not wake up immediately after a seizure. You injure yourself during a seizure.
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
## 2018-01-07 NOTE — ED Notes (Signed)
PT states understanding of care given, follow up care, and medication prescribed. PT ambulated from ED to car with a steady gait. 

## 2018-01-08 ENCOUNTER — Encounter: Payer: Self-pay | Admitting: Internal Medicine

## 2018-01-08 ENCOUNTER — Ambulatory Visit (HOSPITAL_BASED_OUTPATIENT_CLINIC_OR_DEPARTMENT_OTHER): Payer: Medicare Other | Attending: Internal Medicine
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
## 2018-01-08 NOTE — Progress Notes (Signed)
Internal Medicine Clinic Attending  Case discussed with Dr. Maricela Bo  at the time of the visit.  We reviewed the resident's history and exam and pertinent patient test results.  I agree with the assessment, diagnosis, and plan of care documented in the resident's note.  Severe hyperglycemia with mild anion-gap metabolic acidosis. The patient subsequently went to the ED where these abnormalities had resolved. We will continue to manage her diabetes, obesity, vitamin deficiencies, and orthostatic dizziness.

## 2018-01-09 ENCOUNTER — Other Ambulatory Visit: Payer: Self-pay | Admitting: Internal Medicine

## 2018-01-09 NOTE — Telephone Encounter (Signed)
Also requesting pen needles to use with Victoza pens. Thanks
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
## 2018-01-10 ENCOUNTER — Telehealth: Payer: Self-pay | Admitting: Internal Medicine

## 2018-01-10 NOTE — Telephone Encounter (Signed)
Spoke with patient to confirm an appt time and date.  Pt states she was told by MD that she could "stop by" anytime.  Pt warned against coming in without notice and instructed to contact the office prior to coming.  Pt does not have transportation at this time and does not know in advance how she is getting here.  Pt reminded of office hours and again instructed to contact office prior to coming as to make sure her MD would be able to see her.  Pt agreed.Julia Hidden Cassady7/31/20192:17 PM

## 2018-01-10 NOTE — Telephone Encounter (Signed)
Pt missed yesterday called, she is requesting for Dr Maricela Bo call her. Pt contact# [phone]

## 2018-01-10 NOTE — Telephone Encounter (Signed)
I have called the patient back and mentioned that I would like to examine her in person. I told her that I will see her in our acute care clinic Thursday August 1st.  Lars Mage, MD Internal Medicine PGY2 VHSJW:[phone] 01/10/2018, 1:13 PM

## 2018-01-10 NOTE — Telephone Encounter (Signed)
I have sent a message to the front desk to tell the patient to come into acc or continuity clinic to be seen please. The patient's victoza pens were sent on Saturday, I spoke with her pharmacy. Thanks!  [name]

## 2018-01-11 ENCOUNTER — Encounter: Payer: Self-pay | Admitting: Internal Medicine

## 2018-01-11 ENCOUNTER — Ambulatory Visit (INDEPENDENT_AMBULATORY_CARE_PROVIDER_SITE_OTHER): Payer: Medicare Other | Admitting: Internal Medicine

## 2018-01-11 ENCOUNTER — Other Ambulatory Visit: Payer: Self-pay

## 2018-01-11 VITALS — BP 111/54 | HR 59 | Temp 98.1°F | Ht 66.0 in | Wt 177.3 lb

## 2018-01-11 DIAGNOSIS — T1490XA Injury, unspecified, initial encounter: Secondary | ICD-10-CM | POA: Insufficient documentation

## 2018-01-11 DIAGNOSIS — J449 Chronic obstructive pulmonary disease, unspecified: Secondary | ICD-10-CM | POA: Diagnosis not present

## 2018-01-11 DIAGNOSIS — Z79899 Other long term (current) drug therapy: Secondary | ICD-10-CM | POA: Diagnosis not present

## 2018-01-11 DIAGNOSIS — E785 Hyperlipidemia, unspecified: Secondary | ICD-10-CM

## 2018-01-11 DIAGNOSIS — I509 Heart failure, unspecified: Secondary | ICD-10-CM

## 2018-01-11 DIAGNOSIS — Z8719 Personal history of other diseases of the digestive system: Secondary | ICD-10-CM

## 2018-01-11 DIAGNOSIS — E118 Type 2 diabetes mellitus with unspecified complications: Secondary | ICD-10-CM | POA: Diagnosis not present

## 2018-01-11 DIAGNOSIS — Z7982 Long term (current) use of aspirin: Secondary | ICD-10-CM

## 2018-01-11 DIAGNOSIS — S0083XD Contusion of other part of head, subsequent encounter: Secondary | ICD-10-CM

## 2018-01-11 DIAGNOSIS — S301XXD Contusion of abdominal wall, subsequent encounter: Secondary | ICD-10-CM

## 2018-01-11 DIAGNOSIS — D509 Iron deficiency anemia, unspecified: Secondary | ICD-10-CM | POA: Diagnosis not present

## 2018-01-11 DIAGNOSIS — I110 Hypertensive heart disease with heart failure: Secondary | ICD-10-CM

## 2018-01-11 DIAGNOSIS — R55 Syncope and collapse: Secondary | ICD-10-CM | POA: Diagnosis not present

## 2018-01-11 DIAGNOSIS — F17210 Nicotine dependence, cigarettes, uncomplicated: Secondary | ICD-10-CM

## 2018-01-11 LAB — GLUCOSE, CAPILLARY: Glucose-Capillary: 236 mg/dL — ABNORMAL HIGH (ref 70–99)

## 2018-01-11 MED ORDER — INSULIN PEN NEEDLE 32G X 4 MM MISC: 100.0000 | Freq: Every day | 0 refills | Status: DC

## 2018-01-11 MED ORDER — TRAMADOL HCL 50 MG PO TABS: 50.0000 mg | ORAL_TABLET | Freq: Four times a day (QID) | ORAL | 0 refills | Status: AC | PRN

## 2018-01-11 MED FILL — UNIFINE PENTIPS 32GX5/32": 32G X 4 MM | 90 days supply | Qty: 100 | Fill #0

## 2018-01-11 MED FILL — UNIFINE PENTIPS 32GX5/32: 32G X 4 MM | 90 days supply | Qty: 100 | Fill #0

## 2018-01-11 NOTE — Assessment & Plan Note (Signed)
She will be evaluated for cardiac and noncardiogenic causes of her syncope. The patient did not have any murmurs, extra heart sounds or shortness of breath. Orthostatic vital signs were negative.  She does not have any prodromal tunnel vision, diaphoresis, nausea to indicate vasovagal causes.  There is no nuclear urinary incontinence.  She does not recall any sensory or motor changes to indicate TIA.  Patient had a carotid Doppler done in 2014 which showed patent vertebral arteries with antegrade flow.  There was 39% stenosis in the right internal carotid artery and the left internal carotid artery.  He had myocardial perfusion testing done in 2015 which showed low likelihood of any significant blockage.  Plan: -Continue to advise her not to drive  -Cardiology referral  -Ordered 30 day event monitor to assess for arrhythmia -Continue balance and vestibular therapy
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
## 2018-01-15 ENCOUNTER — Other Ambulatory Visit: Payer: Self-pay

## 2018-01-15 ENCOUNTER — Emergency Department (HOSPITAL_COMMUNITY): Payer: Medicare Other

## 2018-01-15 ENCOUNTER — Encounter (HOSPITAL_COMMUNITY): Payer: Self-pay

## 2018-01-15 ENCOUNTER — Inpatient Hospital Stay (HOSPITAL_COMMUNITY)
Admission: EM | Admit: 2018-01-15 | Discharge: 2018-01-17 | DRG: 982 | Disposition: A | Discharge status: Home Health | Payer: Medicare Other | Attending: Internal Medicine | Admitting: Internal Medicine

## 2018-01-15 DIAGNOSIS — M79602 Pain in left arm: Secondary | ICD-10-CM | POA: Diagnosis not present

## 2018-01-15 DIAGNOSIS — R001 Bradycardia, unspecified: Secondary | ICD-10-CM | POA: Diagnosis not present

## 2018-01-15 DIAGNOSIS — S1191XA Laceration without foreign body of unspecified part of neck, initial encounter: Secondary | ICD-10-CM | POA: Diagnosis not present

## 2018-01-15 DIAGNOSIS — R42 Dizziness and giddiness: Secondary | ICD-10-CM | POA: Diagnosis not present

## 2018-01-15 DIAGNOSIS — K219 Gastro-esophageal reflux disease without esophagitis: Secondary | ICD-10-CM | POA: Diagnosis present

## 2018-01-15 DIAGNOSIS — R55 Syncope and collapse: Secondary | ICD-10-CM | POA: Diagnosis not present

## 2018-01-15 DIAGNOSIS — S199XXA Unspecified injury of neck, initial encounter: Secondary | ICD-10-CM | POA: Diagnosis not present

## 2018-01-15 DIAGNOSIS — Y92240 Courthouse as the place of occurrence of the external cause: Secondary | ICD-10-CM | POA: Diagnosis not present

## 2018-01-15 DIAGNOSIS — W19XXXA Unspecified fall, initial encounter: Secondary | ICD-10-CM | POA: Diagnosis not present

## 2018-01-15 DIAGNOSIS — E119 Type 2 diabetes mellitus without complications: Secondary | ICD-10-CM | POA: Diagnosis not present

## 2018-01-15 DIAGNOSIS — I110 Hypertensive heart disease with heart failure: Secondary | ICD-10-CM | POA: Diagnosis not present

## 2018-01-15 DIAGNOSIS — F17210 Nicotine dependence, cigarettes, uncomplicated: Secondary | ICD-10-CM | POA: Diagnosis present

## 2018-01-15 DIAGNOSIS — S01511D Laceration without foreign body of lip, subsequent encounter: Secondary | ICD-10-CM | POA: Diagnosis not present

## 2018-01-15 DIAGNOSIS — Z87828 Personal history of other (healed) physical injury and trauma: Secondary | ICD-10-CM | POA: Diagnosis not present

## 2018-01-15 DIAGNOSIS — M25512 Pain in left shoulder: Secondary | ICD-10-CM | POA: Diagnosis not present

## 2018-01-15 DIAGNOSIS — L089 Local infection of the skin and subcutaneous tissue, unspecified: Secondary | ICD-10-CM | POA: Diagnosis not present

## 2018-01-15 DIAGNOSIS — I5032 Chronic diastolic (congestive) heart failure: Secondary | ICD-10-CM | POA: Diagnosis present

## 2018-01-15 DIAGNOSIS — X58XXXA Exposure to other specified factors, initial encounter: Secondary | ICD-10-CM | POA: Diagnosis not present

## 2018-01-15 DIAGNOSIS — Z884 Allergy status to anesthetic agent status: Secondary | ICD-10-CM

## 2018-01-15 DIAGNOSIS — D509 Iron deficiency anemia, unspecified: Secondary | ICD-10-CM | POA: Diagnosis not present

## 2018-01-15 DIAGNOSIS — M25562 Pain in left knee: Secondary | ICD-10-CM | POA: Diagnosis not present

## 2018-01-15 DIAGNOSIS — T148XXA Other injury of unspecified body region, initial encounter: Secondary | ICD-10-CM

## 2018-01-15 DIAGNOSIS — W1839XA Other fall on same level, initial encounter: Secondary | ICD-10-CM | POA: Diagnosis not present

## 2018-01-15 DIAGNOSIS — S59901A Unspecified injury of right elbow, initial encounter: Secondary | ICD-10-CM | POA: Diagnosis not present

## 2018-01-15 DIAGNOSIS — S8992XA Unspecified injury of left lower leg, initial encounter: Secondary | ICD-10-CM | POA: Diagnosis not present

## 2018-01-15 DIAGNOSIS — J449 Chronic obstructive pulmonary disease, unspecified: Secondary | ICD-10-CM | POA: Diagnosis present

## 2018-01-15 DIAGNOSIS — E1140 Type 2 diabetes mellitus with diabetic neuropathy, unspecified: Secondary | ICD-10-CM | POA: Diagnosis present

## 2018-01-15 DIAGNOSIS — Z23 Encounter for immunization: Secondary | ICD-10-CM | POA: Diagnosis not present

## 2018-01-15 DIAGNOSIS — Z96652 Presence of left artificial knee joint: Secondary | ICD-10-CM | POA: Diagnosis present

## 2018-01-15 DIAGNOSIS — S59902A Unspecified injury of left elbow, initial encounter: Secondary | ICD-10-CM | POA: Diagnosis not present

## 2018-01-15 DIAGNOSIS — S81012A Laceration without foreign body, left knee, initial encounter: Secondary | ICD-10-CM

## 2018-01-15 DIAGNOSIS — E785 Hyperlipidemia, unspecified: Secondary | ICD-10-CM | POA: Diagnosis present

## 2018-01-15 DIAGNOSIS — S41112A Laceration without foreign body of left upper arm, initial encounter: Secondary | ICD-10-CM | POA: Diagnosis not present

## 2018-01-15 DIAGNOSIS — Z888 Allergy status to other drugs, medicaments and biological substances status: Secondary | ICD-10-CM | POA: Diagnosis not present

## 2018-01-15 DIAGNOSIS — I5030 Unspecified diastolic (congestive) heart failure: Secondary | ICD-10-CM | POA: Diagnosis not present

## 2018-01-15 DIAGNOSIS — Z8673 Personal history of transient ischemic attack (TIA), and cerebral infarction without residual deficits: Secondary | ICD-10-CM | POA: Diagnosis not present

## 2018-01-15 DIAGNOSIS — M25522 Pain in left elbow: Secondary | ICD-10-CM | POA: Diagnosis not present

## 2018-01-15 DIAGNOSIS — S0181XA Laceration without foreign body of other part of head, initial encounter: Secondary | ICD-10-CM | POA: Diagnosis present

## 2018-01-15 DIAGNOSIS — S8991XA Unspecified injury of right lower leg, initial encounter: Secondary | ICD-10-CM | POA: Diagnosis not present

## 2018-01-15 DIAGNOSIS — M25521 Pain in right elbow: Secondary | ICD-10-CM | POA: Diagnosis not present

## 2018-01-15 DIAGNOSIS — M25561 Pain in right knee: Secondary | ICD-10-CM | POA: Diagnosis not present

## 2018-01-15 DIAGNOSIS — R0902 Hypoxemia: Secondary | ICD-10-CM | POA: Diagnosis not present

## 2018-01-15 DIAGNOSIS — S4992XA Unspecified injury of left shoulder and upper arm, initial encounter: Secondary | ICD-10-CM | POA: Diagnosis not present

## 2018-01-15 DIAGNOSIS — S01511A Laceration without foreign body of lip, initial encounter: Secondary | ICD-10-CM | POA: Diagnosis not present

## 2018-01-15 DIAGNOSIS — W1830XA Fall on same level, unspecified, initial encounter: Secondary | ICD-10-CM | POA: Diagnosis present

## 2018-01-15 DIAGNOSIS — S81812A Laceration without foreign body, left lower leg, initial encounter: Secondary | ICD-10-CM | POA: Diagnosis not present

## 2018-01-15 DIAGNOSIS — S0990XA Unspecified injury of head, initial encounter: Secondary | ICD-10-CM | POA: Diagnosis not present

## 2018-01-15 LAB — BASIC METABOLIC PANEL
Anion gap: 10 (ref 5–15)
BUN: 23 mg/dL (ref 8–23)
CHLORIDE: 101 mmol/L (ref 98–111)
CO2: 23 mmol/L (ref 22–32)
CREATININE: 1.32 mg/dL — AB (ref 0.44–1.00)
Calcium: 9.4 mg/dL (ref 8.9–10.3)
GFR calc non Af Amer: 42 mL/min — ABNORMAL LOW (ref >60)
GFR, EST AFRICAN AMERICAN: 48 mL/min — AB (ref >60)
Glucose, Bld: 224 mg/dL — ABNORMAL HIGH (ref 70–99)
POTASSIUM: 3.9 mmol/L (ref 3.5–5.1)
Sodium: 134 mmol/L — ABNORMAL LOW (ref 135–145)

## 2018-01-15 LAB — I-STAT TROPONIN, ED
Troponin i, poc: 0.02 ng/mL (ref 0.00–0.08)
Troponin i, poc: 0.03 ng/mL (ref 0.00–0.08)

## 2018-01-15 LAB — URINALYSIS, ROUTINE W REFLEX MICROSCOPIC
BILIRUBIN URINE: NEGATIVE
Glucose, UA: NEGATIVE mg/dL
KETONES UR: NEGATIVE mg/dL
LEUKOCYTES UA: NEGATIVE
Nitrite: NEGATIVE
PH: 6 (ref 5.0–8.0)
Protein, ur: NEGATIVE mg/dL
Specific Gravity, Urine: 1.003 — ABNORMAL LOW (ref 1.005–1.030)

## 2018-01-15 LAB — CBC
HCT: 40.8 % (ref 36.0–46.0)
Hemoglobin: 12.6 g/dL (ref 12.0–15.0)
MCH: 29.9 pg (ref 26.0–34.0)
MCHC: 30.9 g/dL (ref 30.0–36.0)
MCV: 96.7 fL (ref 78.0–100.0)
PLATELETS: 147 10*3/uL — AB (ref 150–400)
RBC: 4.22 MIL/uL (ref 3.87–5.11)
RDW: 13.4 % (ref 11.5–15.5)
WBC: 6.7 10*3/uL (ref 4.0–10.5)

## 2018-01-15 LAB — GLUCOSE, CAPILLARY: GLUCOSE-CAPILLARY: 194 mg/dL — AB (ref 70–99)

## 2018-01-15 MED ORDER — SODIUM CHLORIDE 0.9 % IV BOLUS
1000.0000 mL | Freq: Once | INTRAVENOUS | Status: AC
  Administered 2018-01-15: 1000 mL via INTRAVENOUS

## 2018-01-15 MED ORDER — TETANUS-DIPHTH-ACELL PERTUSSIS 5-2.5-18.5 LF-MCG/0.5 IM SUSP
0.5000 mL | Freq: Once | INTRAMUSCULAR | Status: AC
  Administered 2018-01-15: 0.5 mL via INTRAMUSCULAR
  Filled 2018-01-15: qty 0.5

## 2018-01-15 MED ORDER — ENOXAPARIN SODIUM 40 MG/0.4ML ~~LOC~~ SOLN
40.0000 mg | SUBCUTANEOUS | Status: DC
  Administered 2018-01-16 – 2018-01-17 (×2): 40 mg via SUBCUTANEOUS
  Filled 2018-01-15 (×3): qty 0.4

## 2018-01-15 MED ORDER — BACITRACIN ZINC 500 UNIT/GM EX OINT
TOPICAL_OINTMENT | Freq: Two times a day (BID) | CUTANEOUS | Status: DC
  Administered 2018-01-15: 19:00:00 via TOPICAL
  Administered 2018-01-16 (×2): 31.5556 via TOPICAL
  Administered 2018-01-16: 1 via TOPICAL
  Administered 2018-01-17: 31.5556 via TOPICAL
  Filled 2018-01-15: qty 28.4
  Filled 2018-01-15: qty 0.9

## 2018-01-15 MED ORDER — ACETAMINOPHEN 500 MG PO TABS
1000.0000 mg | ORAL_TABLET | Freq: Once | ORAL | Status: AC
  Administered 2018-01-15: 1000 mg via ORAL
  Filled 2018-01-15: qty 2

## 2018-01-15 MED ORDER — ROSUVASTATIN CALCIUM 40 MG PO TABS
40.0000 mg | ORAL_TABLET | Freq: Every day | ORAL | Status: DC
  Administered 2018-01-16: 40 mg via ORAL
  Filled 2018-01-15: qty 1

## 2018-01-15 MED ORDER — ASPIRIN EC 81 MG PO TBEC
81.0000 mg | DELAYED_RELEASE_TABLET | Freq: Every day | ORAL | Status: DC
  Administered 2018-01-16 – 2018-01-17 (×2): 81 mg via ORAL
  Filled 2018-01-15 (×2): qty 1

## 2018-01-15 MED ORDER — INSULIN ASPART 100 UNIT/ML ~~LOC~~ SOLN
0.0000 [IU] | Freq: Three times a day (TID) | SUBCUTANEOUS | Status: DC
  Administered 2018-01-16 (×3): 3 [IU] via SUBCUTANEOUS
  Administered 2018-01-17: 5 [IU] via SUBCUTANEOUS
  Administered 2018-01-17: 2 [IU] via SUBCUTANEOUS

## 2018-01-15 MED ORDER — INSULIN ASPART 100 UNIT/ML ~~LOC~~ SOLN: 0.0000 [IU] | Freq: Every day | SUBCUTANEOUS | Status: DC

## 2018-01-15 MED ORDER — ACETAMINOPHEN 325 MG PO TABS
650.0000 mg | ORAL_TABLET | Freq: Four times a day (QID) | ORAL | Status: DC | PRN
  Administered 2018-01-16: 650 mg via ORAL
  Filled 2018-01-15: qty 2

## 2018-01-15 MED ORDER — ALBUTEROL SULFATE HFA 108 (90 BASE) MCG/ACT IN AERS: 1.0000 | INHALATION_SPRAY | Freq: Four times a day (QID) | RESPIRATORY_TRACT | Status: DC | PRN

## 2018-01-15 MED ORDER — VITAMIN B-12 1000 MCG PO TABS
1000.0000 ug | ORAL_TABLET | Freq: Every day | ORAL | Status: DC
  Administered 2018-01-16 – 2018-01-17 (×2): 1000 ug via ORAL
  Filled 2018-01-15 (×2): qty 1

## 2018-01-15 MED ORDER — PANTOPRAZOLE SODIUM 40 MG PO TBEC
40.0000 mg | DELAYED_RELEASE_TABLET | Freq: Every day | ORAL | Status: DC
  Administered 2018-01-16 – 2018-01-17 (×2): 40 mg via ORAL
  Filled 2018-01-15 (×2): qty 1

## 2018-01-15 MED ORDER — SERTRALINE HCL 100 MG PO TABS
100.0000 mg | ORAL_TABLET | Freq: Every day | ORAL | Status: DC
  Administered 2018-01-16 – 2018-01-17 (×2): 100 mg via ORAL
  Filled 2018-01-15 (×2): qty 1

## 2018-01-15 MED ORDER — ACETAMINOPHEN 650 MG RE SUPP: 650.0000 mg | Freq: Four times a day (QID) | RECTAL | Status: DC | PRN

## 2018-01-15 MED ORDER — LIDOCAINE-EPINEPHRINE 2 %-1:200000 IJ SOLN
10.0000 mL | Freq: Once | INTRAMUSCULAR | Status: AC
Start: 2018-01-15 — End: 2018-01-15
  Administered 2018-01-15: 10 mL
  Filled 2018-01-15: qty 20

## 2018-01-15 NOTE — ED Notes (Signed)
Patient ambulatory to bathroom with assistance from staff.

## 2018-01-15 NOTE — ED Notes (Addendum)
Unable to obtain IV access at this time r/t pt falling asleep during IV insertion and moving her arm. Attempt x2. Pt given 2 cups of water. Ambulatory to bathroom with assistance from staff. Transported to xray and CT at this time.

## 2018-01-15 NOTE — ED Notes (Signed)
Admitting team @ bedside
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
## 2018-01-16 ENCOUNTER — Ambulatory Visit: Payer: Medicare Other | Admitting: Podiatry

## 2018-01-16 ENCOUNTER — Inpatient Hospital Stay (HOSPITAL_COMMUNITY): Payer: Medicare Other

## 2018-01-16 DIAGNOSIS — R55 Syncope and collapse: Principal | ICD-10-CM

## 2018-01-16 DIAGNOSIS — S81812A Laceration without foreign body, left lower leg, initial encounter: Secondary | ICD-10-CM

## 2018-01-16 DIAGNOSIS — Z8719 Personal history of other diseases of the digestive system: Secondary | ICD-10-CM

## 2018-01-16 DIAGNOSIS — R001 Bradycardia, unspecified: Secondary | ICD-10-CM

## 2018-01-16 DIAGNOSIS — Z8673 Personal history of transient ischemic attack (TIA), and cerebral infarction without residual deficits: Secondary | ICD-10-CM

## 2018-01-16 DIAGNOSIS — W1839XA Other fall on same level, initial encounter: Secondary | ICD-10-CM

## 2018-01-16 DIAGNOSIS — S1191XA Laceration without foreign body of unspecified part of neck, initial encounter: Secondary | ICD-10-CM

## 2018-01-16 DIAGNOSIS — J449 Chronic obstructive pulmonary disease, unspecified: Secondary | ICD-10-CM

## 2018-01-16 DIAGNOSIS — S41112A Laceration without foreign body of left upper arm, initial encounter: Secondary | ICD-10-CM

## 2018-01-16 DIAGNOSIS — S0181XA Laceration without foreign body of other part of head, initial encounter: Secondary | ICD-10-CM

## 2018-01-16 DIAGNOSIS — I110 Hypertensive heart disease with heart failure: Secondary | ICD-10-CM

## 2018-01-16 DIAGNOSIS — Z79899 Other long term (current) drug therapy: Secondary | ICD-10-CM

## 2018-01-16 DIAGNOSIS — E119 Type 2 diabetes mellitus without complications: Secondary | ICD-10-CM

## 2018-01-16 DIAGNOSIS — M79604 Pain in right leg: Secondary | ICD-10-CM

## 2018-01-16 DIAGNOSIS — D509 Iron deficiency anemia, unspecified: Secondary | ICD-10-CM

## 2018-01-16 DIAGNOSIS — Z720 Tobacco use: Secondary | ICD-10-CM

## 2018-01-16 DIAGNOSIS — I5030 Unspecified diastolic (congestive) heart failure: Secondary | ICD-10-CM

## 2018-01-16 DIAGNOSIS — Z884 Allergy status to anesthetic agent status: Secondary | ICD-10-CM

## 2018-01-16 DIAGNOSIS — Z794 Long term (current) use of insulin: Secondary | ICD-10-CM

## 2018-01-16 DIAGNOSIS — E785 Hyperlipidemia, unspecified: Secondary | ICD-10-CM

## 2018-01-16 DIAGNOSIS — Z888 Allergy status to other drugs, medicaments and biological substances status: Secondary | ICD-10-CM

## 2018-01-16 DIAGNOSIS — Z87828 Personal history of other (healed) physical injury and trauma: Secondary | ICD-10-CM

## 2018-01-16 LAB — ECHOCARDIOGRAM COMPLETE
Height: 66 in
WEIGHTICAEL: 2843.2 [oz_av]

## 2018-01-16 LAB — RAPID URINE DRUG SCREEN, HOSP PERFORMED
Amphetamines: NOT DETECTED
Barbiturates: NOT DETECTED
Benzodiazepines: NOT DETECTED
COCAINE: NOT DETECTED
OPIATES: NOT DETECTED
Tetrahydrocannabinol: NOT DETECTED

## 2018-01-16 LAB — COMPREHENSIVE METABOLIC PANEL
ALK PHOS: 62 U/L (ref 38–126)
ALT: 22 U/L (ref 0–44)
AST: 23 U/L (ref 15–41)
Albumin: 3.2 g/dL — ABNORMAL LOW (ref 3.5–5.0)
Anion gap: 8 (ref 5–15)
BUN: 17 mg/dL (ref 8–23)
CALCIUM: 9.2 mg/dL (ref 8.9–10.3)
CHLORIDE: 109 mmol/L (ref 98–111)
CO2: 25 mmol/L (ref 22–32)
CREATININE: 1.13 mg/dL — AB (ref 0.44–1.00)
GFR, EST AFRICAN AMERICAN: 58 mL/min — AB (ref >60)
GFR, EST NON AFRICAN AMERICAN: 50 mL/min — AB (ref >60)
Glucose, Bld: 283 mg/dL — ABNORMAL HIGH (ref 70–99)
Potassium: 3.5 mmol/L (ref 3.5–5.1)
Sodium: 142 mmol/L (ref 135–145)
Total Bilirubin: 0.7 mg/dL (ref 0.3–1.2)
Total Protein: 6 g/dL — ABNORMAL LOW (ref 6.5–8.1)

## 2018-01-16 LAB — GLUCOSE, CAPILLARY
GLUCOSE-CAPILLARY: 232 mg/dL — AB (ref 70–99)
GLUCOSE-CAPILLARY: 214 mg/dL — AB (ref 70–99)
Glucose-Capillary: 222 mg/dL — ABNORMAL HIGH (ref 70–99)
Glucose-Capillary: 154 mg/dL — ABNORMAL HIGH (ref 70–99)

## 2018-01-16 LAB — TSH: TSH: 2.372 u[IU]/mL (ref 0.350–4.500)

## 2018-01-16 LAB — CK: CK TOTAL: 62 U/L (ref 38–234)

## 2018-01-16 MED ORDER — GABAPENTIN 300 MG PO CAPS
300.0000 mg | ORAL_CAPSULE | Freq: Every day | ORAL | Status: DC
  Administered 2018-01-16: 300 mg via ORAL
  Filled 2018-01-16: qty 1

## 2018-01-16 MED ORDER — AMOXICILLIN-POT CLAVULANATE 875-125 MG PO TABS
1.0000 | ORAL_TABLET | Freq: Two times a day (BID) | ORAL | Status: DC
  Administered 2018-01-16 – 2018-01-17 (×2): 1 via ORAL
  Filled 2018-01-16 (×2): qty 1

## 2018-01-16 MED ORDER — GABAPENTIN 300 MG PO CAPS: 300.0000 mg | ORAL_CAPSULE | Freq: Three times a day (TID) | ORAL | Status: DC

## 2018-01-16 MED ORDER — GABAPENTIN 300 MG PO CAPS
600.0000 mg | ORAL_CAPSULE | Freq: Two times a day (BID) | ORAL | Status: DC
  Administered 2018-01-16 – 2018-01-17 (×3): 600 mg via ORAL
  Filled 2018-01-16 (×3): qty 2

## 2018-01-16 NOTE — Care Management Note (Addendum)
Case Management Note  Patient Details  Name: [name] MRN: [id number] Date of Birth: 1950-11-02  Subjective/Objective: Syncope                Action/Plan: Patient lives at home with her family; PCP Dr Lars Mage; has private insurance with Medicare; pharmacy of choice is [facility]; DME - walker; patient could benefit from [facility] services, pt chose [place]; Dan with [facility] called for arrangements. CM will continue to follow for progression of care. Attending MD please enter the face to face for [facility] services per Medicare guidelines.     Expected Discharge Date:      Possibly 01/18/2018            Expected Discharge Plan:  [place]  Discharge planning Services  CM Consult  Choice offered to:  Patient  HH Arranged:  RN, PT [facility] Agency:  [place]  Status of Service:  In process, will continue to follow  Sherrilyn Rist [phone] 01/16/2018, 2:25 PM
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
## 2018-01-16 NOTE — Progress Notes (Signed)
Internal Medicine Attending  Date: 01/16/2018  Patient name: [name] Medical record number: [id number] Date of birth: 1951-03-30 Age: 67 y.o. Gender: female  I saw and evaluated the patient. I reviewed the resident's note by Dr. Laural Golden and I agree with the resident's findings and plans as documented in her progress note.  Please see my H&P dated 01/16/2018 and attached to Dr. Darcey Nora H&P dated 01/15/2018 for the specifics of my evaluation, assessment, and plan from earlier in the day.

## 2018-01-16 NOTE — Progress Notes (Signed)
  Echocardiogram 2D Echocardiogram has been performed.  [name] 01/16/2018, 2:29 PM
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
## 2018-01-16 NOTE — Progress Notes (Signed)
Internal Medicine Clinic Attending  Case discussed with Dr. Chundi at the time of the visit.  We reviewed the resident's history and exam and pertinent patient test results.  I agree with the assessment, diagnosis, and plan of care documented in the resident's note. 
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
## 2018-01-17 ENCOUNTER — Telehealth: Payer: Self-pay

## 2018-01-17 ENCOUNTER — Other Ambulatory Visit: Payer: Self-pay | Admitting: Internal Medicine

## 2018-01-17 DIAGNOSIS — Z8739 Personal history of other diseases of the musculoskeletal system and connective tissue: Secondary | ICD-10-CM

## 2018-01-17 DIAGNOSIS — S01511A Laceration without foreign body of lip, initial encounter: Secondary | ICD-10-CM

## 2018-01-17 DIAGNOSIS — X58XXXA Exposure to other specified factors, initial encounter: Secondary | ICD-10-CM

## 2018-01-17 DIAGNOSIS — M25512 Pain in left shoulder: Secondary | ICD-10-CM

## 2018-01-17 LAB — GLUCOSE, CAPILLARY
GLUCOSE-CAPILLARY: 276 mg/dL — AB (ref 70–99)
Glucose-Capillary: 187 mg/dL — ABNORMAL HIGH (ref 70–99)

## 2018-01-17 MED ORDER — AMOXICILLIN-POT CLAVULANATE 875-125 MG PO TABS: 1.0000 | ORAL_TABLET | Freq: Two times a day (BID) | ORAL | 0 refills | Status: DC

## 2018-01-17 MED ORDER — METFORMIN HCL ER 750 MG PO TB24: 1500.0000 mg | ORAL_TABLET | Freq: Every day | ORAL | 1 refills | Status: DC

## 2018-01-17 MED ORDER — PANTOPRAZOLE SODIUM 40 MG PO TBEC: DELAYED_RELEASE_TABLET | ORAL | 1 refills | Status: DC

## 2018-01-17 NOTE — Discharge Summary (Signed)
Name: [name] MRN: [id number] DOB: 1951/03/01 67 y.o. PCP: Lars Mage, MD  Date of Admission: 01/15/2018 11:37 AM Date of Discharge: 01/17/18 Attending Physician: No att. providers found  Discharge Diagnosis: 1. Syncope 2. Bradycardia 3. Type II DM 4. CHF  5. HTN  Discharge Medications: Allergies as of 01/17/2018      Reactions   Anesthesia S-i-60 Other (See Comments)   Hallucinations.   Flexeril [cyclobenzaprine] Other (See Comments)   Prolonged QTc to 571, tachycardia   Soma [carisoprodol] Itching, Rash      Medication List    STOP taking these medications   amLODipine 5 MG tablet Commonly known as:  NORVASC   Insulin Pen Needle 32G X 4 MM Misc   liraglutide 18 MG/3ML Sopn Commonly known as:  VICTOZA     TAKE these medications   albuterol 108 (90 Base) MCG/ACT inhaler Commonly known as:  PROVENTIL HFA;VENTOLIN HFA Inhale 1-2 puffs into the lungs every 6 (six) hours as needed for wheezing or shortness of breath.   amoxicillin-clavulanate 875-125 MG tablet Commonly known as:  AUGMENTIN Take 1 tablet by mouth every 12 (twelve) hours for 10 days.   aspirin EC 81 MG tablet Take 81 mg by mouth daily.   cyanocobalamin 1000 MCG tablet Take 1 tablet (1,000 mcg total) by mouth daily.   ferrous sulfate 325 (65 FE) MG tablet Take 1 tablet (325 mg total) by mouth every other day.   gabapentin 300 MG capsule Commonly known as:  NEURONTIN Please take Gabapentin 600mg  in the morning, 300mg  midday, and 600mg  at nighttime What changed:    how much to take  how to take this  when to take this  additional instructions   glucose blood test strip Commonly known as:  ONETOUCH VERIO Check blood sugar 3x/day   meloxicam 15 MG tablet Commonly known as:  MOBIC Take 1 tablet (15 mg total) by mouth daily. Take 1 daily with food.   metFORMIN 750 MG 24 hr tablet Commonly known as:  GLUCOPHAGE XR Take 2 tablets (1,500 mg total) by mouth daily with breakfast.   pantoprazole 40 MG tablet Commonly known as:  PROTONIX TAKE 1 TABLET(40 MG) BY MOUTH DAILY   rosuvastatin 40 MG tablet Commonly known as:  CRESTOR Take 1 tablet (40 mg total) by mouth daily.   sertraline 100 MG tablet Commonly known as:  ZOLOFT TAKE 1 TABLET(100 MG) BY MOUTH DAILY What changed:  See the new instructions.   traMADol 50 MG tablet Commonly known as:  ULTRAM Take 50 mg by mouth daily as needed (pain).       Disposition and follow-up:   [name] was discharged from [facility] in Stable condition.  At the hospital follow up visit please address:  1.  Syncopal episodes- follow up appointment with cardiology for holter monitor is on 8/12; DM management- too tightly controlled on victoza, switched to metformin 750 mg ER; CHF and HTN-discontinued amlodipine due to bradycardia   2.  Labs / imaging needed at time of follow-up: none  3.  Pending labs/ test needing follow-up: none  Follow-up Appointments:  Follow up with cardiologist on 01/22/18 at 10:30 am [place] [phone]  Follow up with PCP on 01/23/18 at 10:15 am  Lars Mage, MD [place] / [place] [id number] [place] Follow up.   Why:  They will do  your home health care at your home Contact information: [place] [address] [phone]           Hospital Course by problem list: 1. Syncope- [name] has a 15+ year history of syncopal episodes. She has had 3 recent episodes with no associated prodromal symptoms. EP evaluation and echo were negative for structural disease and no baseline conduction system disease evident. She has a history of anemia, presented with a Hb of 12.6 with no signs of active bleeding. There was concern for hypoglycemic episodes due to too tightly controlled DM; however, patient was not on victoza during the  previous syncopal episode. Plan to event monitor for 30 days. Cardiology appointment is 8/12. 2. Bradycardia- She was found to be in sinus bradycardia at 42 bpm on admission. She was hemodynamically stable with no new ST segment changes on EKG. She is not on any beta blocker but was on amlodipine. Stopped amlodipine. 3. Type II DM- Her last A1c was 5.6. Her DM medications were stopped and then restarted two weeks ago, on victoza and novolog. We discontinued victoza and novolog started her on metformin 750 mg ER. She was accidentally discharged with instructions to continue novolog-called to inform her to discontinue novolog and only continue metformin.  4. Lower lip laceration- On admission, patient had a lower lip laceration that goes through and through. Patient reported on admission that she had some purulent drainage from it and redness that has been worsening. On exam, there was no purulent drainage noted. There was swelling and redness. Started on augmentin BID x 10 days. 5. CHF and HTN-  No signs of volume overload. She was bradycardic on admission so amlodipine was held/discontinued.    Discharge Vitals:   BP 111/68 (BP Location: Left Arm)   Pulse (!) 51   Temp 98.4 F (36.9 C) (Oral)   Resp 18   Ht 5\' 6"  (1.676 m)   Wt 174 lb 6.4 oz (79.1 kg)   SpO2 99%   BMI 28.15 kg/m   Pertinent Labs, Studies, and Procedures:   Cardiac Panel (last 3 results) Recent Labs    01/16/18 0557  CKTOTAL 62   EKG: Personally reviewed. Normal sinus bradycardia at 58 bpm, normal axis, normal intervals, no significant Q waves, no LVH by voltage, delayed R wave progression, no ST changes, T-wave inversions in a strain pattern in the lateral leads. No significant changes from the previous ECG on 12/30/2017.  Recent Results (from the past 43800 hour(s))  ECHOCARDIOGRAM COMPLETE   Collection Time: 01/16/18  2:29 PM  Result Value   Weight 2,843.2   Height 66   BP 151/71   Narrative                               *[facility]*                         [address], [place] [address]                            [phone]  ------------------------------------------------------------------- Transthoracic Echocardiography  Patient:    Julia, Flowers MR #:       [id number] Study Date: 01/16/2018 Gender:     F Age:        39 Height:     167.6 cm Weight:     80.6 kg BSA:        1.96 m^2 Pt. Status: Room:       [place]    Karren Cobble  PERFORMING   [facility] Inpatient  REFERRING    Kathi Ludwig  SONOGRAPHER  [facility]  ATTENDING    Maudie Flakes  cc:  ------------------------------------------------------------------- LV EF: 65% -   70%  ------------------------------------------------------------------- Indications:      Syncope 780.2.  ------------------------------------------------------------------- Study Conclusions  - Left ventricle: The cavity size was normal. Systolic function was   vigorous. The estimated ejection fraction was in the range of 65%   to 70%. Wall motion was normal; there were no regional wall   motion abnormalities. Doppler parameters are consistent with   abnormal left ventricular relaxation (grade 1 diastolic   dysfunction).  ------------------------------------------------------------------- Study data:  Comparison was made to the study of 06/06/2017.  Study status:  Routine.  Procedure:  The patient reported no pain pre or post test. Transthoracic echocardiography. Image quality was adequate.  Study completion:  There were no complications. Transthoracic echocardiography.  M-mode, complete 2D, spectral Doppler, and color Doppler.  Birthdate:  Patient birthdate: 11-07-1950.  Age:  Patient is 67 yr old.  Sex:  Gender: female. BMI: 28.7 kg/m^2.  Blood pressure:     134/76   Patient status: Inpatient.  Study date:  Study date: 01/16/2018. Study time: 02:13 PM.  Location:  Bedside.  -------------------------------------------------------------------  ------------------------------------------------------------------- Left ventricle:  The cavity size was normal. Systolic function was vigorous. The estimated ejection fraction was in the range of 65% to 70%. Wall motion was normal; there were no regional wall motion abnormalities. Doppler parameters are consistent with abnormal left ventricular relaxation (grade 1 diastolic dysfunction).  ------------------------------------------------------------------- Aortic valve:   Trileaflet; normal thickness leaflets. Mobility was not restricted.  Doppler:  Transvalvular velocity was within the normal range. There was no stenosis. There was no regurgitation. VTI ratio of LVOT to aortic valve: 0.83. Valve area (VTI): 2.61 cm^2. Indexed valve area (VTI): 1.33 cm^2/m^2. Mean velocity ratio of LVOT to aortic valve: 0.91. Valve area (Vmean): 2.86 cm^2. Indexed valve area (Vmean): 1.46 cm^2/m^2.    Mean gradient (S): 6 mm Hg.  ------------------------------------------------------------------- Aorta:  Aortic root: The aortic root was normal in size.  ------------------------------------------------------------------- Mitral valve:   Structurally normal valve.   Mobility was not restricted.  Doppler:  Transvalvular velocity was within the normal range. There was no evidence for stenosis. There was no regurgitation.  ------------------------------------------------------------------- Left atrium:  The atrium was normal in size.  ------------------------------------------------------------------- Right ventricle:  The cavity size was normal. Wall thickness was normal. Systolic function was normal.  ------------------------------------------------------------------- Pulmonic valve:    Doppler:  Transvalvular velocity  was within the normal range. There was no evidence for stenosis.  ------------------------------------------------------------------- Tricuspid valve:   Structurally normal valve.    Doppler: Transvalvular velocity was within the normal range. There was no regurgitation.  ------------------------------------------------------------------- Pulmonary artery:   The main pulmonary artery was normal-sized. Systolic pressure was within the normal range.  ------------------------------------------------------------------- Right atrium:  The atrium was normal in size.  ------------------------------------------------------------------- Pericardium:  There was  no pericardial effusion.  ------------------------------------------------------------------- Systemic veins: Inferior vena cava: The vessel was normal in size.  ------------------------------------------------------------------- Measurements   Left ventricle                            Value          Reference  Stroke volume, 2D                         101   ml       ---------  Stroke volume/bsa, 2D                     52    ml/m^2   ---------  LV e&', lateral                            3.37  cm/s     ---------  LV E/e&', lateral                          19.44          ---------  LV s&', lateral                            6.64  cm/s     ---------  LV e&', medial                             3.7   cm/s     ---------  LV E/e&', medial                           17.7           ---------  LV e&', average                            3.54  cm/s     ---------  LV E/e&', average                          18.53          ---------    LVOT                                      Value          Reference  LVOT ID, S                                20    mm       ---------  LVOT area                                 3.14  cm^2     ---------  LVOT mean velocity, S                     103   cm/s     ---------  LVOT VTI, S  32.1  cm       ---------    Aortic valve                              Value          Reference  Aortic valve mean velocity, S             113   cm/s     ---------  Aortic valve VTI, S                       38.6  cm       ---------  Aortic mean gradient, S                   6     mm Hg    ---------  VTI ratio, LVOT/AV                        0.83           ---------  Aortic valve area, VTI                    2.61  cm^2     ---------  Aortic valve area/bsa, VTI                1.33  cm^2/m^2 ---------  Velocity ratio, mean, LVOT/AV             0.91           ---------  Aortic valve area, mean velocity          2.86  cm^2     ---------  Aortic valve area/bsa, mean               1.46  cm^2/m^2 ---------  velocity    Aorta                                     Value          Reference  Ascending aorta ID, A-P, S                36    mm       ---------    Left atrium                               Value          Reference  LA volume, S                              69.5  ml       ---------  LA volume/bsa, S                          35.5  ml/m^2   ---------  LA volume, ES, 1-p A4C                    71.8  ml       ---------  LA volume/bsa, ES, 1-p A4C                36.7  ml/m^2   ---------  LA volume, ES, 1-p  A2C                    63.5  ml       ---------  LA volume/bsa, ES, 1-p A2C                32.4  ml/m^2   ---------    Mitral valve                              Value          Reference  Mitral E-wave peak velocity               65.5  cm/s     ---------  Mitral A-wave peak velocity               119   cm/s     ---------  Mitral deceleration time            (H)   261   ms       150 - 230  Mitral E/A ratio, peak                    0.6            ---------    Pulmonary veins                           Value          Reference  Pulmonary vein peak velocity, S           55    cm/s     ---------  Pulmonary vein peak velocity, D           41    cm/s     ---------  Pulmonary vein velocity ratio,             1.4            ---------  peak, S/D    Systemic veins                            Value          Reference  Estimated CVP                             3     mm Hg    ---------    Right ventricle                           Value          Reference  RV ID, ED, PLAX                           36    mm       19 - 38  TAPSE                                     35    mm       ---------  RV s&', lateral, S  9.14  cm/s     ---------    Pulmonic valve                            Value          Reference  Pulmonic valve peak velocity, S           89.6  cm/s     ---------  Legend: (L)  and  (H)  mark values outside specified reference range.  ------------------------------------------------------------------- Prepared and Electronically Authenticated by  Candee Furbish, M.D. 2019-08-06T15:21:09   *Note: Due to a large number of results and/or encounters for the requested time period, some results have not been displayed. A complete set of results can be found in Results Review.    Dg Elbow Complete Left  Result Date: 01/15/2018 CLINICAL DATA:  Pain following fall EXAM: LEFT ELBOW - COMPLETE 3+ VIEW COMPARISON:  None. FINDINGS: Frontal, lateral common bile oblique views were obtained. No fracture or dislocation. No joint effusion. There is a spur along the coracoid process of the proximal ulna. There are small spurs along each distal humeral condyle. No erosive joint or significant joint space narrowing. IMPRESSION: There is a degree of osteoarthritic change. No fracture or dislocation. Electronically Signed   By: Lowella Grip III M.D.   On: 01/15/2018 12:25   Dg Elbow Complete Right  Result Date: 01/15/2018 CLINICAL DATA:  Pain following recent fall EXAM: RIGHT ELBOW - COMPLETE 3+ VIEW COMPARISON:  None. FINDINGS: Frontal, lateral, and bilateral oblique views were obtained. No fracture or dislocation. No appreciable joint effusion. There are spurs along each distal  humeral condyle as well as arising from the coracoid process of the proximal ulna. No appreciable joint space narrowing or erosion. IMPRESSION: Areas of osteoarthritic change.  No evident fracture or dislocation. Electronically Signed   By: Lowella Grip III M.D.   On: 01/15/2018 12:23   Ct Head Wo Contrast  Result Date: 01/15/2018 CLINICAL DATA:  Fall.  Struck back of head. EXAM: CT HEAD WITHOUT CONTRAST CT CERVICAL SPINE WITHOUT CONTRAST TECHNIQUE: Multidetector CT imaging of the head and cervical spine was performed following the standard protocol without intravenous contrast. Multiplanar CT image reconstructions of the cervical spine were also generated. COMPARISON:  01/07/2018 FINDINGS: CT HEAD FINDINGS Brain: Mild global atrophy. Chronic ischemic changes in the periventricular white matter are noted. Scattered lacunar infarcts in the bilateral basal ganglia have a chronic appearance. No mass effect, midline shift, or acute hemorrhage. Vascular: No hyperdense vessel or unexpected calcification. Skull: Cranium is intact. Specifically, there is no posterior or occipital skull fracture. Sinuses/Orbits: Mastoid air cells are clear. Mucous retention cysts in the maxillary sinuses are partially imaged. Orbits are within normal limits. Other: Noncontributory CT CERVICAL SPINE FINDINGS Alignment: Cervical lordosis is reversed. Skull base and vertebrae: No vertebral compression deformity. No acute fracture or dislocation. Soft tissues and spinal canal: No obvious spinal or soft tissue hematoma. Normal appearance of the thyroid gland. No abnormal mass effect in the neck. Disc levels: There is severe disc space narrowing at C4-5 with posterior osteophytic ridging. There are also uncovertebral osteophytes encroaching upon the bilateral foramina at this level. There are less prominent degenerative changes at the other levels. Upper chest: Negative. Other: Noncontributory IMPRESSION: No acute intracranial pathology.  Chronic ischemic changes and atrophy are noted No acute cervical spine injury. Degenerative changes are most prominent at the C4-5 level. Electronically Signed   By: Rodena Goldmann.D.  On: 01/15/2018 16:23   Ct Cervical Spine Wo Contrast  Result Date: 01/15/2018 CLINICAL DATA:  Fall.  Struck back of head. EXAM: CT HEAD WITHOUT CONTRAST CT CERVICAL SPINE WITHOUT CONTRAST TECHNIQUE: Multidetector CT imaging of the head and cervical spine was performed following the standard protocol without intravenous contrast. Multiplanar CT image reconstructions of the cervical spine were also generated. COMPARISON:  01/07/2018 FINDINGS: CT HEAD FINDINGS Brain: Mild global atrophy. Chronic ischemic changes in the periventricular white matter are noted. Scattered lacunar infarcts in the bilateral basal ganglia have a chronic appearance. No mass effect, midline shift, or acute hemorrhage. Vascular: No hyperdense vessel or unexpected calcification. Skull: Cranium is intact. Specifically, there is no posterior or occipital skull fracture. Sinuses/Orbits: Mastoid air cells are clear. Mucous retention cysts in the maxillary sinuses are partially imaged. Orbits are within normal limits. Other: Noncontributory CT CERVICAL SPINE FINDINGS Alignment: Cervical lordosis is reversed. Skull base and vertebrae: No vertebral compression deformity. No acute fracture or dislocation. Soft tissues and spinal canal: No obvious spinal or soft tissue hematoma. Normal appearance of the thyroid gland. No abnormal mass effect in the neck. Disc levels: There is severe disc space narrowing at C4-5 with posterior osteophytic ridging. There are also uncovertebral osteophytes encroaching upon the bilateral foramina at this level. There are less prominent degenerative changes at the other levels. Upper chest: Negative. Other: Noncontributory IMPRESSION: No acute intracranial pathology. Chronic ischemic changes and atrophy are noted No acute cervical spine  injury. Degenerative changes are most prominent at the C4-5 level. Electronically Signed   By: Marybelle Killings M.D.   On: 01/15/2018 16:23   Dg Knee Complete 4 Views Left  Result Date: 01/15/2018 CLINICAL DATA:  Pain following fall EXAM: LEFT KNEE - COMPLETE 4+ VIEW COMPARISON:  None. FINDINGS: Frontal, lateral, and bilateral oblique views were obtained. 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 No fracture or dislocation. No joint effusion. No erosive change. There are spurs along the anterior patella superiorly and inferiorly. 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 Spurs along the anterior patella, likely due to distal quadriceps and proximal patellar tendinosis. No fracture or dislocation. No joint effusion evident. Electronically Signed   By: Lowella Grip III M.D.   On: 01/15/2018 12:27   Dg Knee Complete 4 Views Right  Result Date: 01/15/2018 CLINICAL DATA:  Pain following fall EXAM: RIGHT KNEE - COMPLETE 4+ VIEW COMPARISON:  None. FINDINGS: Frontal, lateral, and bilateral oblique views were obtained. No acute fracture or dislocation. No joint effusion. There is narrowing of the patellofemoral joint with spurring along the anterior and posterior aspects of the patella. There are scattered foci of calcification within the joint. No erosive changes. 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 No fracture or joint effusion. Electronically Signed   By: Lowella Grip III M.D.   On: 01/15/2018 12:26   Dg Humerus Left  Result Date: 01/15/2018 CLINICAL DATA:  Acute LEFT arm pain following fall. EXAM: LEFT HUMERUS - 2+ VIEW COMPARISON:  None. FINDINGS: No acute fracture, subluxation or dislocation. Surgical anchors in the humeral head noted. No suspicious focal bony lesions are present. IMPRESSION: No acute abnormality. Electronically Signed    By: Margarette Canada M.D.   On: 01/15/2018 15:58   Discharge Instructions: Follow up with cardiology for holter monitoring. Follow up with PCP. Discontinue victoza, novolog and amlodipine.    Signed: Mike Craze, DO 01/17/2018, 5:11 PM  Pager: [phone]

## 2018-01-17 NOTE — Progress Notes (Signed)
Discussed discharge with patient.  Instructed patient to keep all follow up appointments and take medications as prescribed.  Patient did not have any question at this time.  Daughter will be picking pt up.  Pt has walker at home.
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
## 2018-01-17 NOTE — Progress Notes (Signed)
Internal Medicine Attending  Date: 01/17/2018  Patient name: [name] Medical record number: [id number] Date of birth: 09-07-1950 Age: 66 y.o. Gender: female  I saw and evaluated the patient. I reviewed the resident's note by Dr. Laural Golden and I agree with the resident's findings and plans as documented in her progress note.  When seen on rounds this morning [name] was feeling well and was without complaints including dizziness. She was previously scheduled to pick up her event monitor today at 9 AM, but was not discharged in time. This appointment has been changed to August 12 five days from now. The lesion on the inner lip is significant for a laceration of approximately 1-1/2 cm with a base that has a fibrinous exudate but no purulent drainage from the lesion. We've decided to treat with Augmentin for 10 days. She is stable for discharge home with a 30 day event monitor in hopes of better defining the cause of her syncopal events.
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
## 2018-01-17 NOTE — Telephone Encounter (Signed)
Hospital TOC per Dr Laural Golden, discharge 01/17/2018, appt 01/23/2018.

## 2018-01-19 ENCOUNTER — Other Ambulatory Visit: Payer: Self-pay | Admitting: Physician Assistant

## 2018-01-19 DIAGNOSIS — H5017 Alternating exotropia with V pattern: Secondary | ICD-10-CM | POA: Diagnosis not present

## 2018-01-19 DIAGNOSIS — H532 Diplopia: Secondary | ICD-10-CM | POA: Diagnosis not present

## 2018-01-19 DIAGNOSIS — R55 Syncope and collapse: Secondary | ICD-10-CM

## 2018-01-19 DIAGNOSIS — E119 Type 2 diabetes mellitus without complications: Secondary | ICD-10-CM | POA: Diagnosis not present

## 2018-01-19 DIAGNOSIS — H40013 Open angle with borderline findings, low risk, bilateral: Secondary | ICD-10-CM | POA: Diagnosis not present

## 2018-01-19 LAB — HM DIABETES EYE EXAM

## 2018-01-21 LAB — CULTURE, BLOOD (ROUTINE X 2)
Culture: NO GROWTH
Culture: NO GROWTH

## 2018-01-22 ENCOUNTER — Telehealth: Payer: Self-pay | Admitting: Internal Medicine

## 2018-01-22 ENCOUNTER — Ambulatory Visit (INDEPENDENT_AMBULATORY_CARE_PROVIDER_SITE_OTHER): Payer: Medicare Other

## 2018-01-22 DIAGNOSIS — Z7982 Long term (current) use of aspirin: Secondary | ICD-10-CM | POA: Diagnosis not present

## 2018-01-22 DIAGNOSIS — Z7984 Long term (current) use of oral hypoglycemic drugs: Secondary | ICD-10-CM | POA: Diagnosis not present

## 2018-01-22 DIAGNOSIS — D509 Iron deficiency anemia, unspecified: Secondary | ICD-10-CM | POA: Diagnosis not present

## 2018-01-22 DIAGNOSIS — E785 Hyperlipidemia, unspecified: Secondary | ICD-10-CM | POA: Diagnosis not present

## 2018-01-22 DIAGNOSIS — I110 Hypertensive heart disease with heart failure: Secondary | ICD-10-CM | POA: Diagnosis not present

## 2018-01-22 DIAGNOSIS — K259 Gastric ulcer, unspecified as acute or chronic, without hemorrhage or perforation: Secondary | ICD-10-CM | POA: Diagnosis not present

## 2018-01-22 DIAGNOSIS — Z720 Tobacco use: Secondary | ICD-10-CM | POA: Diagnosis not present

## 2018-01-22 DIAGNOSIS — R55 Syncope and collapse: Secondary | ICD-10-CM

## 2018-01-22 DIAGNOSIS — E119 Type 2 diabetes mellitus without complications: Secondary | ICD-10-CM | POA: Diagnosis not present

## 2018-01-22 DIAGNOSIS — J449 Chronic obstructive pulmonary disease, unspecified: Secondary | ICD-10-CM | POA: Diagnosis not present

## 2018-01-22 DIAGNOSIS — I509 Heart failure, unspecified: Secondary | ICD-10-CM | POA: Diagnosis not present

## 2018-01-22 DIAGNOSIS — K219 Gastro-esophageal reflux disease without esophagitis: Secondary | ICD-10-CM | POA: Diagnosis not present

## 2018-01-22 NOTE — Telephone Encounter (Signed)
That would be good.  Thank you

## 2018-01-22 NOTE — Telephone Encounter (Signed)
All from [place] requesting VO for [facility]ound [facility].  She would also like to know if PCP would like for [facility] to remove the pt's sutures from her lip and left knee.  Per Ronny Bacon if you can not reach her @ the Cell number provided please contact her at her house number of [phone].

## 2018-01-23 ENCOUNTER — Other Ambulatory Visit: Payer: Self-pay

## 2018-01-23 ENCOUNTER — Encounter: Payer: Self-pay | Admitting: Internal Medicine

## 2018-01-23 ENCOUNTER — Ambulatory Visit (INDEPENDENT_AMBULATORY_CARE_PROVIDER_SITE_OTHER): Payer: Medicare Other | Admitting: Internal Medicine

## 2018-01-23 VITALS — BP 133/69 | HR 72 | Temp 98.3°F | Ht 66.0 in | Wt 177.2 lb

## 2018-01-23 DIAGNOSIS — J309 Allergic rhinitis, unspecified: Secondary | ICD-10-CM

## 2018-01-23 DIAGNOSIS — Z7982 Long term (current) use of aspirin: Secondary | ICD-10-CM

## 2018-01-23 DIAGNOSIS — F17210 Nicotine dependence, cigarettes, uncomplicated: Secondary | ICD-10-CM | POA: Diagnosis not present

## 2018-01-23 DIAGNOSIS — Z7984 Long term (current) use of oral hypoglycemic drugs: Secondary | ICD-10-CM | POA: Diagnosis not present

## 2018-01-23 DIAGNOSIS — R55 Syncope and collapse: Secondary | ICD-10-CM | POA: Diagnosis not present

## 2018-01-23 DIAGNOSIS — E118 Type 2 diabetes mellitus with unspecified complications: Secondary | ICD-10-CM

## 2018-01-23 DIAGNOSIS — I10 Essential (primary) hypertension: Secondary | ICD-10-CM | POA: Diagnosis not present

## 2018-01-23 NOTE — Telephone Encounter (Signed)
Ronny Bacon, [facility] - requesting verbal order to see pt , VO given for "[facility] and wound care" also ok to remove sutures per Dr Maricela Bo. But she stated she will need to know when to rem sutures "this week' or "next week" for example. Stated when she saw pt - look like they could be rem this week. Please advise Thanks

## 2018-01-23 NOTE — Assessment & Plan Note (Signed)
His blood pressure during this visit was 133/69.  Patient's amlodipine was stopped secondary to syncope.   Assessment and plan The patient's blood pressure is under good control today off of amlodipine.

## 2018-01-23 NOTE — Telephone Encounter (Signed)
Natasha with [facility] requesting to speak with a nurse about [facility] order and wound care. Please call back.

## 2018-01-23 NOTE — Patient Instructions (Addendum)
It was a pleasure to see you today [name]. I am sorry to hear about your repeated dizziness episodes. Please continue to wear your holter monitor for a month. I will follow up with you in 1 month. Thank you!  If you have any questions or concerns, please call our clinic at [phone] between 9am-5pm and after hours call [phone] and ask for the internal medicine resident on call. If you feel you are having a medical emergency please call 911.   Thank you, we look forward to help you remain healthy!  Lars Mage, MD Internal Medicine PGY2
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
## 2018-01-23 NOTE — Assessment & Plan Note (Signed)
The patient's last A1c was 5.6 in June 2019.  She has had numerous changes to her medication with metformin being added and discontinued and subsequently added back.  The patient's Victoza was stopped during her hospitalization and she was switched to metformin 750 mg ER.  Assessment and plan Recommended the patient continue metformin 750mg  bid

## 2018-01-24 ENCOUNTER — Telehealth: Payer: Self-pay

## 2018-01-24 NOTE — Telephone Encounter (Signed)
Patient completed HFU 01/23/2018. Hubbard Hartshorn, RN, BSN

## 2018-01-24 NOTE — Progress Notes (Signed)
Internal Medicine Clinic Attending  Case discussed with Dr. Chundi at the time of the visit.  We reviewed the resident's history and exam and pertinent patient test results.  I agree with the assessment, diagnosis, and plan of care documented in the resident's note. 

## 2018-01-24 NOTE — Telephone Encounter (Signed)
Called pt regarding monitor alert of syncopal episode. Pt states she was sitting in her chair and "blacked out" then woke up a few minutes later. Monitor showed NSR. Dr Meda Coffee signed off and had no additional recommendation. Pt declined medical assistance. Monitor will be scanned in for review.

## 2018-01-24 NOTE — Telephone Encounter (Signed)
Called natasha back to give verbal order and cannot reach her. This is second attempt. I left a message for her to call back.

## 2018-01-25 ENCOUNTER — Telehealth: Payer: Self-pay | Admitting: Internal Medicine

## 2018-01-25 DIAGNOSIS — I110 Hypertensive heart disease with heart failure: Secondary | ICD-10-CM | POA: Diagnosis not present

## 2018-01-25 DIAGNOSIS — I509 Heart failure, unspecified: Secondary | ICD-10-CM | POA: Diagnosis not present

## 2018-01-25 DIAGNOSIS — E119 Type 2 diabetes mellitus without complications: Secondary | ICD-10-CM | POA: Diagnosis not present

## 2018-01-25 DIAGNOSIS — R55 Syncope and collapse: Secondary | ICD-10-CM | POA: Diagnosis not present

## 2018-01-25 DIAGNOSIS — J449 Chronic obstructive pulmonary disease, unspecified: Secondary | ICD-10-CM | POA: Diagnosis not present

## 2018-01-25 DIAGNOSIS — D509 Iron deficiency anemia, unspecified: Secondary | ICD-10-CM | POA: Diagnosis not present

## 2018-01-25 NOTE — Telephone Encounter (Signed)
[name] needs a verbal order to placed sutures, pt contact [phone]

## 2018-01-25 NOTE — Telephone Encounter (Signed)
Was able to reach [facility] and she mentioned that she got my order and has removed the sutures. Thank you!

## 2018-01-25 NOTE — Telephone Encounter (Signed)
Dr Maricela Bo, please advise on when [facility] can remove sutures

## 2018-01-25 NOTE — Telephone Encounter (Signed)
I have called Ronny Bacon and left messages to call back. She has my order.

## 2018-01-26 ENCOUNTER — Encounter: Payer: Self-pay | Admitting: Internal Medicine

## 2018-01-26 ENCOUNTER — Ambulatory Visit (AMBULATORY_SURGERY_CENTER): Payer: Medicare Other | Admitting: Internal Medicine

## 2018-01-26 ENCOUNTER — Telehealth: Payer: Self-pay | Admitting: Internal Medicine

## 2018-01-26 VITALS — BP 121/54 | HR 62 | Temp 98.6°F | Resp 18 | Ht 66.0 in | Wt 177.0 lb

## 2018-01-26 DIAGNOSIS — K3189 Other diseases of stomach and duodenum: Secondary | ICD-10-CM | POA: Diagnosis not present

## 2018-01-26 DIAGNOSIS — K254 Chronic or unspecified gastric ulcer with hemorrhage: Secondary | ICD-10-CM | POA: Diagnosis not present

## 2018-01-26 DIAGNOSIS — E119 Type 2 diabetes mellitus without complications: Secondary | ICD-10-CM | POA: Diagnosis not present

## 2018-01-26 DIAGNOSIS — J449 Chronic obstructive pulmonary disease, unspecified: Secondary | ICD-10-CM | POA: Diagnosis not present

## 2018-01-26 DIAGNOSIS — K219 Gastro-esophageal reflux disease without esophagitis: Secondary | ICD-10-CM | POA: Diagnosis not present

## 2018-01-26 MED ORDER — PANTOPRAZOLE SODIUM 40 MG PO TBEC: 40.0000 mg | DELAYED_RELEASE_TABLET | Freq: Two times a day (BID) | ORAL | 12 refills | Status: DC

## 2018-01-26 MED ORDER — SODIUM CHLORIDE 0.9 % IV SOLN: 500.0000 mL | INTRAVENOUS | Status: DC

## 2018-01-26 NOTE — Op Note (Signed)
[place] Patient Name: [name] Procedure Date: 01/26/2018 10:33 AM MRN: [id number] Endoscopist: Jerene Bears , MD Age: 67 Referring MD:  Date of Birth: [birth year] Gender: Female Account #: [id number] Procedure:                Upper GI endoscopy Indications:              Follow-up of acute gastric ulcer with hemorrhage                            seen at EGD in May 2019 Medicines:                Monitored Anesthesia Care Procedure:                Pre-Anesthesia Assessment:                           - Prior to the procedure, a History and Physical                            was performed, and patient medications and                            allergies were reviewed. The patient's tolerance of                            previous anesthesia was also reviewed. The risks                            and benefits of the procedure and the sedation                            options and risks were discussed with the patient.                            All questions were answered, and informed consent                            was obtained. Prior Anticoagulants: The patient has                            taken no previous anticoagulant or antiplatelet                            agents. ASA Grade Assessment: III - A patient with                            severe systemic disease. After reviewing the risks                            and benefits, the patient was deemed in                            satisfactory condition to undergo the procedure.  After obtaining informed consent, the endoscope was                            passed under direct vision. Throughout the                            procedure, the patient's blood pressure, pulse, and                            oxygen saturations were monitored continuously. The                            Endoscope was introduced through the mouth, and                            advanced to the second part of  duodenum. The upper                            GI endoscopy was accomplished without difficulty.                            The patient tolerated the procedure well. Scope In: Scope Out: Findings:                 The examined esophagus was normal.                           Severe inflammation with hemorrhage characterized                            by adherent blood and erythema was found in the                            gastric antrum. Multiple biopsies were obtained                            with cold forceps for histology and Helicobacter                            pylori testing in the gastric body, at the incisura                            and in the gastric antrum. The previously seen                            gastric ulcerations have healed.                           The cardia and gastric fundus were normal on                            retroflexion.                           The examined duodenum was normal. Complications:  No immediate complications. Estimated Blood Loss:     Estimated blood loss was minimal. Impression:               - Normal esophagus.                           - Hemorrhagic gastritis. Previously seen antral                            ulcers have healed.                           - Normal examined duodenum.                           - Multiple biopsies were obtained in the gastric                            body, at the incisura and in the gastric antrum. Recommendation:           - Patient has a contact number available for                            emergencies. The signs and symptoms of potential                            delayed complications were discussed with the                            patient. Return to normal activities tomorrow.                            Written discharge instructions were provided to the                            patient.                           - Resume previous diet.                           - Continue  present medications.                           - Increase pantoprazole to 40 mg twice daily before                            1st and last meal of the day.                           - Avoid NSAIDs.                           - Await pathology results.                           - Continue iron replacement and monitor Hgb and  iron studies with PCP. Jerene Bears, MD 01/26/2018 10:50:23 AM This report has been signed electronically.

## 2018-01-26 NOTE — Progress Notes (Signed)
No problems noted in the recovery room. maw 

## 2018-01-26 NOTE — Progress Notes (Signed)
Called to room to assist during endoscopic procedure.  Patient ID and intended procedure confirmed with present staff. Received instructions for my participation in the procedure from the performing physician.  

## 2018-01-26 NOTE — Telephone Encounter (Signed)
Rtc, lm for rtc 

## 2018-01-26 NOTE — Patient Instructions (Signed)
YOU HAD AN ENDOSCOPIC PROCEDURE TODAY AT [facility]:   Refer to the procedure report that was given to you for any specific questions about what was found during the examination.  If the procedure report does not answer your questions, please call your gastroenterologist to clarify.  If you requested that your care partner not be given the details of your procedure findings, then the procedure report has been included in a sealed envelope for you to review at your convenience later.  YOU SHOULD EXPECT: Some feelings of bloating in the abdomen. Passage of more gas than usual.  Walking can help get rid of the air that was put into your GI tract during the procedure and reduce the bloating. If you had a lower endoscopy (such as a colonoscopy or flexible sigmoidoscopy) you may notice spotting of blood in your stool or on the toilet paper. If you underwent a bowel prep for your procedure, you may not have a normal bowel movement for a few days.  Please Note:  You might notice some irritation and congestion in your nose or some drainage.  This is from the oxygen used during your procedure.  There is no need for concern and it should clear up in a day or so.  SYMPTOMS TO REPORT IMMEDIATELY:    Following upper endoscopy (EGD)  Vomiting of blood or coffee ground material  New chest pain or pain under the shoulder blades  Painful or persistently difficult swallowing  New shortness of breath  Fever of 100F or higher  Black, tarry-looking stools  For urgent or emergent issues, a gastroenterologist can be reached at any hour by calling [phone].   DIET:  We do recommend a small meal at first, but then you may proceed to your regular diet.  Drink plenty of fluids but you should avoid alcoholic beverages for 24 hours.  ACTIVITY:  You should plan to take it easy for the rest of today and you should NOT DRIVE or use heavy machinery until tomorrow (because of the sedation medicines used  during the test).    FOLLOW UP: Our staff will call the number listed on your records the next business day following your procedure to check on you and address any questions or concerns that you may have regarding the information given to you following your procedure. If we do not reach you, we will leave a message.  However, if you are feeling well and you are not experiencing any problems, there is no need to return our call.  We will assume that you have returned to your regular daily activities without incident.  If any biopsies were taken you will be contacted by phone or by letter within the next 1-3 weeks.  Please call us at [phone] if you have not heard about the biopsies in 3 weeks.    SIGNATURES/CONFIDENTIALITY: You and/or your care partner have signed paperwork which will be entered into your electronic medical record.  These signatures attest to the fact that that the information above on your After Visit Summary has been reviewed and is understood.  Full responsibility of the confidentiality of this discharge information lies with you and/or your care-partner.   Handout was given to your care partner on gastritis. Per Dr. Hilarie Fredrickson increase PANTOPRAZOLE 40 mg twice per daily before 1 st and last meal of the day. AVOID NSAIDs. Continue IRON replacement and monitor Hgb and iron studies with PCP. You may resume your other current medications today.  Await biopsy results. Please call if any questions or concerns.

## 2018-01-26 NOTE — Telephone Encounter (Signed)
[facility] nurse calling to report a drop in the pt's Orthostatic vital signs yesterday 128/70 to 102/60.  Please call her back if any questions.

## 2018-01-26 NOTE — Progress Notes (Signed)
Report to PACU, RN, vss, BBS= Clear.  

## 2018-01-29 ENCOUNTER — Telehealth: Payer: Self-pay | Admitting: *Deleted

## 2018-01-29 ENCOUNTER — Ambulatory Visit (INDEPENDENT_AMBULATORY_CARE_PROVIDER_SITE_OTHER): Payer: Medicare Other | Admitting: Podiatry

## 2018-01-29 ENCOUNTER — Encounter: Payer: Self-pay | Admitting: Podiatry

## 2018-01-29 ENCOUNTER — Ambulatory Visit (INDEPENDENT_AMBULATORY_CARE_PROVIDER_SITE_OTHER): Payer: Medicare Other

## 2018-01-29 DIAGNOSIS — M79671 Pain in right foot: Secondary | ICD-10-CM

## 2018-01-29 DIAGNOSIS — L84 Corns and callosities: Secondary | ICD-10-CM

## 2018-01-29 DIAGNOSIS — I248 Other forms of acute ischemic heart disease: Secondary | ICD-10-CM

## 2018-01-29 DIAGNOSIS — E1149 Type 2 diabetes mellitus with other diabetic neurological complication: Secondary | ICD-10-CM | POA: Diagnosis not present

## 2018-01-29 MED ORDER — DICLOFENAC SODIUM 1 % TD GEL: 2.0000 g | Freq: Four times a day (QID) | TRANSDERMAL | 2 refills | Status: DC

## 2018-01-29 NOTE — Telephone Encounter (Signed)
  Follow up Call-  Call back number 01/26/2018 03/28/2017  Post procedure Call Back phone  # [phone]  Permission to leave phone message Yes Yes  Some recent data might be hidden     Patient questions:  Do you have a fever, pain , or abdominal swelling? No. Pain Score  0 *  Have you tolerated food without any problems? Yes.    Have you been able to return to your normal activities? Yes.    Do you have any questions about your discharge instructions: Diet   No. Medications  No. Follow up visit  No.  Do you have questions or concerns about your Care? No.  Actions: * If pain score is 4 or above: No action needed, pain <4.

## 2018-01-29 NOTE — Telephone Encounter (Signed)
Sounds good. Happy to see her

## 2018-01-30 DIAGNOSIS — I110 Hypertensive heart disease with heart failure: Secondary | ICD-10-CM | POA: Diagnosis not present

## 2018-01-30 DIAGNOSIS — E119 Type 2 diabetes mellitus without complications: Secondary | ICD-10-CM | POA: Diagnosis not present

## 2018-01-30 DIAGNOSIS — R55 Syncope and collapse: Secondary | ICD-10-CM | POA: Diagnosis not present

## 2018-01-30 DIAGNOSIS — D509 Iron deficiency anemia, unspecified: Secondary | ICD-10-CM | POA: Diagnosis not present

## 2018-01-30 DIAGNOSIS — J449 Chronic obstructive pulmonary disease, unspecified: Secondary | ICD-10-CM | POA: Diagnosis not present

## 2018-01-30 DIAGNOSIS — I509 Heart failure, unspecified: Secondary | ICD-10-CM | POA: Diagnosis not present

## 2018-01-31 ENCOUNTER — Encounter: Payer: Self-pay | Admitting: Internal Medicine

## 2018-01-31 NOTE — Telephone Encounter (Signed)
Spoke with patient and offered appt in [facility] to see PCP. Patient declined. States she's feeling well at this time and doesn't think BP is dropping as much upon standing. She is taking BP with home BP unit as well as having [facility] take it. She will schedule appt if symptoms return. Hubbard Hartshorn, RN, BSN

## 2018-01-31 NOTE — Telephone Encounter (Signed)
Please request home health nurse to send her blood pressure readings

## 2018-01-31 NOTE — Telephone Encounter (Signed)
Left message on VM requesting return call. No identifying info on recording so no patient info given. Hubbard Hartshorn, RN, BSN
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
## 2018-01-31 NOTE — Telephone Encounter (Signed)
Thank you :)

## 2018-01-31 NOTE — Telephone Encounter (Signed)
Ronny Bacon will take orthostatic BPs at next visit (next week) and report findings. Hubbard Hartshorn, RN, BSN

## 2018-02-01 DIAGNOSIS — I509 Heart failure, unspecified: Secondary | ICD-10-CM | POA: Diagnosis not present

## 2018-02-01 DIAGNOSIS — D509 Iron deficiency anemia, unspecified: Secondary | ICD-10-CM | POA: Diagnosis not present

## 2018-02-01 DIAGNOSIS — R55 Syncope and collapse: Secondary | ICD-10-CM | POA: Diagnosis not present

## 2018-02-01 DIAGNOSIS — J449 Chronic obstructive pulmonary disease, unspecified: Secondary | ICD-10-CM | POA: Diagnosis not present

## 2018-02-01 DIAGNOSIS — E119 Type 2 diabetes mellitus without complications: Secondary | ICD-10-CM | POA: Diagnosis not present

## 2018-02-01 DIAGNOSIS — I110 Hypertensive heart disease with heart failure: Secondary | ICD-10-CM | POA: Diagnosis not present

## 2018-02-02 ENCOUNTER — Other Ambulatory Visit: Payer: Self-pay | Admitting: Internal Medicine

## 2018-02-02 DIAGNOSIS — E1142 Type 2 diabetes mellitus with diabetic polyneuropathy: Secondary | ICD-10-CM

## 2018-02-02 NOTE — Telephone Encounter (Signed)
Next appt scheduled 9/20 with PCP.

## 2018-02-06 ENCOUNTER — Other Ambulatory Visit: Payer: Self-pay | Admitting: Internal Medicine

## 2018-02-06 ENCOUNTER — Telehealth: Payer: Self-pay | Admitting: Internal Medicine

## 2018-02-06 DIAGNOSIS — E1142 Type 2 diabetes mellitus with diabetic polyneuropathy: Secondary | ICD-10-CM

## 2018-02-06 NOTE — Telephone Encounter (Signed)
Needs refill on gabapentin (NEURONTIN) 300 MG capsule @ [facility]; pt contact [phone]  Pt is on her last one, pt doesn't have a phone at the moment

## 2018-02-06 NOTE — Telephone Encounter (Signed)
Rx for gabapentin sent to [facility] on [address] on 02/02/2018. Called [facility] and was told Rx for gabapentin was called to them on 02/04/2018. States med is ready for pick up. Hubbard Hartshorn, RN, BSN

## 2018-02-07 ENCOUNTER — Encounter: Payer: Self-pay | Admitting: Physician Assistant

## 2018-02-07 NOTE — Addendum Note (Signed)
Addended by: Orson Gear on: 02/07/2018 03:38 PM   Modules accepted: Orders

## 2018-02-09 ENCOUNTER — Encounter: Payer: Medicare Other | Admitting: Internal Medicine

## 2018-02-09 DIAGNOSIS — I509 Heart failure, unspecified: Secondary | ICD-10-CM | POA: Diagnosis not present

## 2018-02-09 DIAGNOSIS — I110 Hypertensive heart disease with heart failure: Secondary | ICD-10-CM | POA: Diagnosis not present

## 2018-02-09 DIAGNOSIS — D509 Iron deficiency anemia, unspecified: Secondary | ICD-10-CM | POA: Diagnosis not present

## 2018-02-09 DIAGNOSIS — J449 Chronic obstructive pulmonary disease, unspecified: Secondary | ICD-10-CM | POA: Diagnosis not present

## 2018-02-09 DIAGNOSIS — R55 Syncope and collapse: Secondary | ICD-10-CM | POA: Diagnosis not present

## 2018-02-09 DIAGNOSIS — E119 Type 2 diabetes mellitus without complications: Secondary | ICD-10-CM | POA: Diagnosis not present

## 2018-02-09 NOTE — Addendum Note (Signed)
Addended by: Truddie Crumble on: 02/09/2018 03:56 PM   Modules accepted: Orders
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
## 2018-02-21 DIAGNOSIS — R55 Syncope and collapse: Secondary | ICD-10-CM | POA: Diagnosis not present

## 2018-02-25 ENCOUNTER — Ambulatory Visit (HOSPITAL_BASED_OUTPATIENT_CLINIC_OR_DEPARTMENT_OTHER): Payer: Medicare Other | Attending: Internal Medicine | Admitting: Internal Medicine

## 2018-02-25 VITALS — Ht 66.0 in | Wt 180.0 lb

## 2018-02-25 DIAGNOSIS — E663 Overweight: Secondary | ICD-10-CM | POA: Diagnosis not present

## 2018-02-25 DIAGNOSIS — G4761 Periodic limb movement disorder: Secondary | ICD-10-CM | POA: Diagnosis not present

## 2018-02-25 DIAGNOSIS — Z6829 Body mass index (BMI) 29.0-29.9, adult: Secondary | ICD-10-CM | POA: Insufficient documentation

## 2018-02-26 ENCOUNTER — Encounter: Payer: Self-pay | Admitting: *Deleted

## 2018-02-28 ENCOUNTER — Ambulatory Visit (INDEPENDENT_AMBULATORY_CARE_PROVIDER_SITE_OTHER): Payer: Medicare Other | Admitting: Physician Assistant

## 2018-02-28 VITALS — BP 115/72 | HR 90 | Ht 66.0 in | Wt 173.0 lb

## 2018-02-28 DIAGNOSIS — I248 Other forms of acute ischemic heart disease: Secondary | ICD-10-CM

## 2018-02-28 DIAGNOSIS — R55 Syncope and collapse: Secondary | ICD-10-CM

## 2018-02-28 DIAGNOSIS — I951 Orthostatic hypotension: Secondary | ICD-10-CM

## 2018-02-28 NOTE — Patient Instructions (Signed)
Medication Instructions:   Your physician recommends that you continue on your current medications as directed. Please refer to the Current Medication list given to you today.   If you need a refill on your cardiac medications before your next appointment, please call your pharmacy.  Labwork: NONE ORDERED  TODAY    Testing/Procedures: NONE ORDERED  TODAY    Follow-Up: IN 2 MONTHS WITH URSUY    Any Other Special Instructions Will Be Listed Below (If Applicable).

## 2018-03-01 NOTE — Progress Notes (Signed)
   CC: Diabetes Mellitus follow-up  HPI:  [name] is a 67 y.o. with diabetes mellitus type 2 and associated neuropathy, hypertension, iron deficiency anemia due to chronic blood loss, and major depressive disorder presents for follow up regarding diabetes mellitus. Please see problem based charting for evaluation, assessment, and plan.  Past Medical History:  Diagnosis Date  . Allergy   . Anemia   . Arthritis    "left knee" (09/08/2015)  . Asthma   . Blood transfusion without reported diagnosis 10/2017   anemic  . Brachial plexus disorders   . CHF (congestive heart failure) ([place])   . Chronic pain    neck pain, headache, neuropathy  . COPD (chronic obstructive pulmonary disease) ([place])    SEES ONLY DR. PATEL   . Depression    "when my husband passed in 2013"  . Gastric ulcer   . GERD (gastroesophageal reflux disease)   . Hypertension   . Hypertriglyceridemia   . Migraine    "monthly" (09/08/2015)  . Neuromuscular disorder (HCC)    neuropathy  . Puncture wound of foot, right 05/17/2012   Tetanus shot 3 yrs ago at [facility] in [place], per pt report   . Stroke ([place])    TIAS    IN [place]   4 YRS AGO   . Type II diabetes mellitus ([place]) 2010   diagnosed around 2010, only ever on metformin   Review of Systems:    Review of Systems  Gastrointestinal: Negative for abdominal pain, nausea and vomiting.  Genitourinary: Negative for urgency.  Neurological: Positive for dizziness.   Physical Exam:  Vitals:   03/02/18 1321  Weight: 175 lb 3.2 oz (79.5 kg)   Physical Exam  Constitutional: She appears well-developed and well-nourished. No distress.  HENT:  Head: Normocephalic and atraumatic.  Cardiovascular: Normal rate, regular rhythm and normal heart sounds.  Respiratory: Effort normal and breath sounds normal. No respiratory distress. She has no wheezes.  GI: Soft. Bowel sounds are normal. She exhibits no distension. There is no tenderness.  Musculoskeletal:  She exhibits no edema.  Pain on dorsal aspect of right foot  Neurological: She is alert.  Skin: She is not diaphoretic.  Psychiatric: She has a normal mood and affect. Her behavior is normal. Judgment and thought content normal.     Assessment & Plan:   See Encounters Tab for problem based charting.  Patient discussed with Dr. Dareen Piano

## 2018-03-02 ENCOUNTER — Ambulatory Visit (INDEPENDENT_AMBULATORY_CARE_PROVIDER_SITE_OTHER): Payer: Medicare Other | Admitting: Internal Medicine

## 2018-03-02 ENCOUNTER — Encounter: Payer: Self-pay | Admitting: Internal Medicine

## 2018-03-02 VITALS — Wt 175.2 lb

## 2018-03-02 DIAGNOSIS — N179 Acute kidney failure, unspecified: Secondary | ICD-10-CM

## 2018-03-02 DIAGNOSIS — R55 Syncope and collapse: Secondary | ICD-10-CM

## 2018-03-02 DIAGNOSIS — F17210 Nicotine dependence, cigarettes, uncomplicated: Secondary | ICD-10-CM

## 2018-03-02 DIAGNOSIS — E1140 Type 2 diabetes mellitus with diabetic neuropathy, unspecified: Secondary | ICD-10-CM

## 2018-03-02 DIAGNOSIS — Z89411 Acquired absence of right great toe: Secondary | ICD-10-CM

## 2018-03-02 DIAGNOSIS — F329 Major depressive disorder, single episode, unspecified: Secondary | ICD-10-CM | POA: Diagnosis not present

## 2018-03-02 DIAGNOSIS — I10 Essential (primary) hypertension: Secondary | ICD-10-CM | POA: Diagnosis not present

## 2018-03-02 DIAGNOSIS — Z7984 Long term (current) use of oral hypoglycemic drugs: Secondary | ICD-10-CM

## 2018-03-02 DIAGNOSIS — Z79899 Other long term (current) drug therapy: Secondary | ICD-10-CM | POA: Diagnosis not present

## 2018-03-02 DIAGNOSIS — Z0000 Encounter for general adult medical examination without abnormal findings: Secondary | ICD-10-CM

## 2018-03-02 DIAGNOSIS — M7741 Metatarsalgia, right foot: Secondary | ICD-10-CM | POA: Diagnosis not present

## 2018-03-02 DIAGNOSIS — E118 Type 2 diabetes mellitus with unspecified complications: Secondary | ICD-10-CM

## 2018-03-02 DIAGNOSIS — M79671 Pain in right foot: Secondary | ICD-10-CM

## 2018-03-02 DIAGNOSIS — I951 Orthostatic hypotension: Secondary | ICD-10-CM

## 2018-03-02 DIAGNOSIS — Z7982 Long term (current) use of aspirin: Secondary | ICD-10-CM

## 2018-03-02 DIAGNOSIS — D500 Iron deficiency anemia secondary to blood loss (chronic): Secondary | ICD-10-CM | POA: Diagnosis not present

## 2018-03-02 DIAGNOSIS — K250 Acute gastric ulcer with hemorrhage: Secondary | ICD-10-CM

## 2018-03-02 LAB — POCT GLYCOSYLATED HEMOGLOBIN (HGB A1C): HEMOGLOBIN A1C: 8.8 % — AB (ref 4.0–5.6)

## 2018-03-02 LAB — GLUCOSE, CAPILLARY: Glucose-Capillary: 90 mg/dL (ref 70–99)

## 2018-03-02 MED ORDER — TROLAMINE SALICYLATE 10 % EX CREA: 1.0000 "application " | TOPICAL_CREAM | CUTANEOUS | 0 refills | Status: DC | PRN

## 2018-03-02 MED ORDER — SITAGLIPTIN PHOSPHATE 50 MG PO TABS: 50.0000 mg | ORAL_TABLET | Freq: Every day | ORAL | 0 refills | Status: DC

## 2018-03-02 MED ORDER — METFORMIN HCL ER (MOD) 1000 MG PO TB24: 1000.0000 mg | ORAL_TABLET | Freq: Two times a day (BID) | ORAL | 0 refills | Status: DC

## 2018-03-02 NOTE — Assessment & Plan Note (Signed)
Urine microalbumin Refused Influenza vaccine

## 2018-03-02 NOTE — Patient Instructions (Addendum)
It was a pleasure to see you today [place]. Please make the following changes:  -Please start taking januvia 50mg  daily along with metformin 1000mg  twice daily -Please decrease your gabapentin dose to 300mg  three times daily  -Please use asper cream on your right foot  If you have any questions or concerns, please call our clinic at [phone] between 9am-5pm and after hours call [phone] and ask for the internal medicine resident on call. If you feel you are having a medical emergency please call 911.   Thank you, we look forward to help you remain healthy!  Lars Mage, MD Internal Medicine PGY2

## 2018-03-02 NOTE — Assessment & Plan Note (Signed)
Ordered cbc, ferritin, iron, and tibc to check for iron deficiency

## 2018-03-02 NOTE — Assessment & Plan Note (Signed)
The patient's blood pressure during this visit was 113/64. The patient is currently not taking any antihypertensive agents. Her last blood pressure visits are  BP Readings from Last 3 Encounters:  02/28/18 115/72  01/26/18 (!) 121/54  01/23/18 133/69  The patient does/does not report palpitations, chest pain, or sob*.  Plan The patient's blood pressure continues to be at goal without any antihypertensive agents.

## 2018-03-02 NOTE — Assessment & Plan Note (Addendum)
Evaluated by Electrophysiologist Dr. Rayann Heman who feels that her repeated syncopal episodes are due to orthostatic/dysautonic process. A 30 day holter monitor was placed and review showed no bradycardia or arrhythmia. Due to positive orthostatic changes noted in Dr. Jackalyn Lombard office the patient is thought to have orthostatic syncope.   Assessment and plan The patient continues to have positive orthostatic vital signs. She states that she continues to have some dizziness when she stands up.  Reviewed patient's medication that shows she is on gabapentin which has known side effect of dizziness, drowsiness, and fatigue. The patient agreed to be titrated down of current gabapentin dose. Recommended patient use support stockings and good hydration.  -Gabapentin decreased to 300mg  td

## 2018-03-02 NOTE — Assessment & Plan Note (Signed)
The patient has been having pain on the dorsal aspect of right foot that has worsened since her amputation to first toe. The patient's pain are likely secondary to diabetic neuropathy and also from metatarsalgia.   The patient states that she is not getting much relief from gabapentin and requested for her dose to be decreased. She is currently taking 1500mg  of gabapentin, will decrease to 900mg  of gabapentin.   -Counseled patient to continue to use insoles made by podiatrist. Her current shoes are likely making her metatarsalgia worse.  -Decreased gabapentin to 300mg  tid -prescribed aspercreme for patient to apply to right foot

## 2018-03-02 NOTE — Assessment & Plan Note (Signed)
The patient is currently taking metformin 1500mg  qd and has been adherent with her medication. The patient's last a1c=5.6 on 11/24/17 and during this visit her a1c is 8.8. She has not had significant weight changes (175-180lbs).  Assessment and plan  The patient's a1c is not controlled and she needs additional antihyperglycemic agents. The patient was previously well controlled on januvia and metformin. Will restart [place].   -Patient is to increase metformin to 1000mg  bid  -Started patient on januvia 50mg  daily

## 2018-03-02 NOTE — Assessment & Plan Note (Signed)
Baseline creatinine ranging 1.0-1.4 over the past year with gfr of 37-50 placing the patient at CKD stage 3 if presence of albumininuria as well. Last ua done in August 2019 showed small amount of hgb and negative bilirubin. Urinalysis done on 12/05/17 showed 1+ protein, 2+ rbc, without any presence of casts.  Patient was previously on an acei (lisinopril), but this was stopped in June 2019 due to her frequent syncopal episodes.   Assessment and plan  There is concern for the patient developing diabetic nephropathy. Will get microalbumin/creatinine ratio.

## 2018-03-03 LAB — IRON AND TIBC
IRON SATURATION: 22 % (ref 15–55)
IRON: 82 ug/dL (ref 27–139)
Total Iron Binding Capacity: 378 ug/dL (ref 250–450)
UIBC: 296 ug/dL (ref 118–369)

## 2018-03-03 LAB — FERRITIN: FERRITIN: 48 ng/mL (ref 15–150)

## 2018-03-03 LAB — CBC
HEMOGLOBIN: 14 g/dL (ref 11.1–15.9)
Hematocrit: 43.4 % (ref 34.0–46.6)
MCH: 29.1 pg (ref 26.6–33.0)
MCHC: 32.3 g/dL (ref 31.5–35.7)
MCV: 90 fL (ref 79–97)
Platelets: 156 10*3/uL (ref 150–450)
RBC: 4.81 x10E6/uL (ref 3.77–5.28)
RDW: 13 % (ref 12.3–15.4)
WBC: 9.7 10*3/uL (ref 3.4–10.8)

## 2018-03-05 ENCOUNTER — Other Ambulatory Visit: Payer: Self-pay | Admitting: Internal Medicine

## 2018-03-05 DIAGNOSIS — E1142 Type 2 diabetes mellitus with diabetic polyneuropathy: Secondary | ICD-10-CM

## 2018-03-05 NOTE — Progress Notes (Signed)
Internal Medicine Clinic Attending  Case discussed with Dr. Chundi at the time of the visit.  We reviewed the resident's history and exam and pertinent patient test results.  I agree with the assessment, diagnosis, and plan of care documented in the resident's note. 

## 2018-03-07 DIAGNOSIS — E663 Overweight: Secondary | ICD-10-CM

## 2018-03-10 NOTE — Procedures (Signed)
   Patient Name: [name], [place] Date: 02/25/2018 Gender: Female D.O.B: 1950/07/10 Age (years): 66 Referring Provider: Bartholomew Crews Height (inches): 38 Interpreting Physician: Baird Lyons MD, ABSM Weight (lbs): 180 RPSGT: Zadie Rhine BMI: 29 MRN: [id number] Neck Size: 14.50  CLINICAL INFORMATION Sleep Study Type: NPSG Indication for sleep study: Obesity Epworth Sleepiness Score: 6  SLEEP STUDY TECHNIQUE As per the AASM Manual for the Scoring of Sleep and Associated Events v2.3 (April 2016) with a hypopnea requiring 4% desaturations.  The channels recorded and monitored were frontal, central and occipital EEG, electrooculogram (EOG), submentalis EMG (chin), nasal and oral airflow, thoracic and abdominal wall motion, anterior tibialis EMG, snore microphone, electrocardiogram, and pulse oximetry.  MEDICATIONS Medications self-administered by patient taken the night of the study : none reported  SLEEP ARCHITECTURE The study was initiated at 11:06:48 PM and ended at 5:07:51 AM.  Sleep onset time was 37.0 minutes and the sleep efficiency was 84.3%%. The total sleep time was 304.5 minutes.  Stage REM latency was 49.5 minutes.  The patient spent 2.8%% of the night in stage N1 sleep, 85.1%% in stage N2 sleep, 0.0%% in stage N3 and 12.2% in REM.  Alpha intrusion was absent.  Supine sleep was 60.20%.  RESPIRATORY PARAMETERS The overall apnea/hypopnea index (AHI) was 0.8 per hour. There were 3 total apneas, including 1 obstructive, 2 central and 0 mixed apneas. There were 1 hypopneas and 3 RERAs.  The AHI during Stage REM sleep was 3.2 per hour.  AHI while supine was 1.0 per hour.  The mean oxygen saturation was 92.2%. The minimum SpO2 during sleep was 85.0%.  snoring was noted during this study.  CARDIAC DATA The 2 lead EKG demonstrated sinus rhythm. The mean heart rate was 87.0 beats per minute. Other EKG findings include: None.  LEG MOVEMENT DATA The  total PLMS were 183 with a resulting PLMS index of 36.1/ hr. Associated arousal with leg movement index was 1.4 .  IMPRESSIONS - No significant obstructive sleep apnea occurred during this study (AHI = 0.8/h). - No significant central sleep apnea occurred during this study (CAI = 0.4/h). - Mild oxygen desaturation was noted during this study (Min O2 = 85.0%). Mean sat 92.2%.  - No snoring was audible during this study. - No cardiac abnormalities were noted during this study. - Limb movements were frequent, but only a few clearly triggered arousals during this study night.  DIAGNOSIS - Periodic Limb Movement Sleep Disorder  RECOMMENDATIONS - Consider therapeutic trial of Requip, Mirapex or other therapy for Limb Movement sleep disturbance. - Patient described home sleep disturbance as due to bathroom trips, but only went once on this night- compare to home experience. - Sleep hygiene should be reviewed to assess factors that may improve sleep quality. - Weight management and regular exercise should be initiated or continued if appropriate.  [Electronically signed] 03/10/2018 12:48 PM  Baird Lyons MD, [place], American Board of Sleep Medicine   NPI: [id number]                         [place], [place] [facility]  ELECTRONICALLY SIGNED ON:  03/10/2018, 12:42 PM [place] PH: [phone]   FX: [phone] [place]

## 2018-03-14 ENCOUNTER — Other Ambulatory Visit: Payer: Self-pay | Admitting: Internal Medicine

## 2018-03-14 DIAGNOSIS — F322 Major depressive disorder, single episode, severe without psychotic features: Secondary | ICD-10-CM

## 2018-03-19 ENCOUNTER — Telehealth: Payer: Self-pay | Admitting: Internal Medicine

## 2018-03-19 NOTE — Telephone Encounter (Signed)
Pt was discharge from [facility] on 03/16/2018; Belia Heman [phone].  Pt goals all meet

## 2018-04-02 ENCOUNTER — Encounter: Payer: Self-pay | Admitting: Podiatry

## 2018-04-02 ENCOUNTER — Ambulatory Visit (INDEPENDENT_AMBULATORY_CARE_PROVIDER_SITE_OTHER): Payer: Medicare Other

## 2018-04-02 ENCOUNTER — Ambulatory Visit (INDEPENDENT_AMBULATORY_CARE_PROVIDER_SITE_OTHER): Payer: Medicare Other | Admitting: Podiatry

## 2018-04-02 ENCOUNTER — Encounter

## 2018-04-02 DIAGNOSIS — L97512 Non-pressure chronic ulcer of other part of right foot with fat layer exposed: Secondary | ICD-10-CM | POA: Diagnosis not present

## 2018-04-02 DIAGNOSIS — E1142 Type 2 diabetes mellitus with diabetic polyneuropathy: Secondary | ICD-10-CM | POA: Diagnosis not present

## 2018-04-02 MED ORDER — AMOXICILLIN-POT CLAVULANATE 875-125 MG PO TABS: 1.0000 | ORAL_TABLET | Freq: Two times a day (BID) | ORAL | 0 refills | Status: AC

## 2018-04-02 NOTE — Progress Notes (Signed)
Subjective:   [name] is a 67 yo female with diabetic neuropathy and  amputation history who presents today for continued care of painful preulcerative callus of the right foot.  She states the callus is very painful when weightbearing and she can only tolerate wearing open toed sandals. She has diabetic shoes, surgical shoes and says she can only tolerate her sandals. She denies any drainage, redness or swelling of her foot. She denies nausea, vomiting, fevers or chills.   Objective:   [name] is a 67 yo WF, in NAD. AAO x 3. Pleasant and cooperative.  DP/PT pulses palpable bilaterally, CRT less than 3 seconds Predebridement: Lesion located submetatarsal head 5 right foot with hard, calloused area measuring 1.0 x 1.0. It has about 0.3 cm elevation and is painful to palpation.   Postdebridement:  Ulcer is full thickness to fat layer. There is no drainage or pus there is no swelling erythema, ascending cellulitis.  There is no fluctuation or crepitation or any malodor.    Measurements carried out today of 0.3 x 0.5 x 0.2 cm.  No redness, streaking or lymphangitis noted.  No probing to bone, no undermining, no active pus or pirulence noted.    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 No osteolysis noted remaining 5th metatarsal shaft.  Assessment:   Diabetic Ulceration submetatarsal head 5 right foot   Plan: Discussed etiology, pathology, and conservative therapies and at this time office debridement was recommended .  Ulcer was prepped with Betadine and culture of the wound was taken on today.  Ulcer was debrided and reactive hyperkeratoses and necrotic tissue was resected to the level of bleeding or viable tissue. No deep abscess, no erythema, no edema, no cellulitis, no odor was encountered.    Xrays were taken/discussed with patient of the right foot on today.  Iodosorb Gel and a sterile dressing was applied.  Patient was given instructions on  offloading and dressing change/aftercare and was instructed to call immediately if any signs or symptoms of infection arise.   She is to keep the right foot dry until ulcer heals. She is to change her dressing once daily, cleansing area with saline and applying Iodosorb Gel to base of wound and cover with light dressing.  Antibiotics will be sent to her pharmacy: Augmentin 875/125 mg; 1 tablet by mouth twice daily for 10 days. I did ask her to follow up in 1 week, but CMA informed me [name] said she would be back in 10 days.  She is to report to local ER should she experience any drainage, redness, swelling, worsening of wound of the right foot.  Also report to ER with any fever, chills, nightsweats, nausea or vomiting with any changes in her right foot.

## 2018-04-03 ENCOUNTER — Telehealth: Payer: Self-pay

## 2018-04-03 ENCOUNTER — Other Ambulatory Visit: Payer: Self-pay | Admitting: Internal Medicine

## 2018-04-03 DIAGNOSIS — I10 Essential (primary) hypertension: Secondary | ICD-10-CM

## 2018-04-03 NOTE — Telephone Encounter (Signed)
Called [name] this morning to let her know that Dr. Elisha Ponder sent Augmentin to her pharmacy yesterday. Advised to take it twice a day for 10 days, and that if we need to change it after the culture comes back, we will let her know. [name] voiced understanding.

## 2018-04-05 ENCOUNTER — Telehealth: Payer: Self-pay

## 2018-04-05 LAB — WOUND CULTURE
GRAM STAIN:: NONE SEEN
MICRO NUMBER: 91262126
RESULT:: NO GROWTH
SPECIMEN QUALITY:: ADEQUATE

## 2018-04-05 NOTE — Telephone Encounter (Signed)
Spoke to patient and advised that her wound culture came back negative for infection. Advised that Dr. Elisha Ponder would like her to complete the antibiotics as prescribed anyway. Patient voiced understanding.

## 2018-04-10 ENCOUNTER — Other Ambulatory Visit: Payer: Self-pay | Admitting: Internal Medicine

## 2018-04-10 DIAGNOSIS — E1142 Type 2 diabetes mellitus with diabetic polyneuropathy: Secondary | ICD-10-CM

## 2018-04-12 ENCOUNTER — Ambulatory Visit (INDEPENDENT_AMBULATORY_CARE_PROVIDER_SITE_OTHER): Payer: Medicare Other | Admitting: Podiatry

## 2018-04-12 ENCOUNTER — Ambulatory Visit: Payer: Medicare Other

## 2018-04-12 ENCOUNTER — Ambulatory Visit (INDEPENDENT_AMBULATORY_CARE_PROVIDER_SITE_OTHER): Payer: Medicare Other

## 2018-04-12 DIAGNOSIS — L97512 Non-pressure chronic ulcer of other part of right foot with fat layer exposed: Secondary | ICD-10-CM

## 2018-04-12 DIAGNOSIS — I248 Other forms of acute ischemic heart disease: Secondary | ICD-10-CM

## 2018-04-12 DIAGNOSIS — L97511 Non-pressure chronic ulcer of other part of right foot limited to breakdown of skin: Secondary | ICD-10-CM | POA: Diagnosis not present

## 2018-04-13 NOTE — Addendum Note (Signed)
Addended by: Orson Gear on: 04/13/2018 02:37 PM   Modules accepted: Orders

## 2018-04-16 NOTE — Progress Notes (Signed)
Subjective: 67 year old female presents the office today for follow evaluation of a wound on the right foot submetatarsal 5.  Since he was treated also for which she states that she is been feeling better.  No drainage or pus or any swelling or redness.  No other concerns today.  She still has some discomfort to the area at times but overall much better. Denies any systemic complaints such as fevers, chills, nausea, vomiting. No acute changes since last appointment, and no other complaints at this time.   Objective: AAO x3, NAD DP/PT pulses palpable bilaterally, CRT less than 3 seconds Right submetatarsal 5 area is a hyperkeratotic lesion with central pinpoint granular wound.  There is no probing to bone, undermining or tunneling.  No swelling erythema, ascending cellulitis.  No fluctuation or crepitation or any malodor.  Decreased tenderness to palpation of the area. No open lesions or pre-ulcerative lesions.  No pain with calf compression, swelling, warmth, erythema  Assessment: Right foot ulceration without signs of infection  Plan: -All treatment options discussed with the patient including all alternatives, risks, complications.  -Wound culture was negative.  I did sharply debride the hyperkeratotic lesion today without any complications or bleeding.  Continue offloading at all times.  I took a new x-ray today and the bone margin is sharp.  This is the remnant of the fifth metatarsal but there is no significant bony exostosis present.-Several things for her including diabetic shoes, inserts, offloading pads that she does not wear. -Patient encouraged to call the office with any questions, concerns, change in symptoms.   Trula Slade DPM

## 2018-04-17 ENCOUNTER — Other Ambulatory Visit: Payer: Self-pay | Admitting: Internal Medicine

## 2018-04-17 NOTE — Telephone Encounter (Signed)
Needs refill on gabapentin (NEURONTIN) 300 MG capsule [facility] on [address]   ;pt contact [phone]

## 2018-04-17 NOTE — Telephone Encounter (Signed)
Called pt again, she will pick up

## 2018-04-17 NOTE — Telephone Encounter (Signed)
This was sent in 10/29, called and got the call center for [facility], they state the med is ready for pick up, called pt and line is busy

## 2018-04-19 ENCOUNTER — Encounter: Payer: Self-pay | Admitting: Internal Medicine

## 2018-04-19 ENCOUNTER — Other Ambulatory Visit: Payer: Self-pay

## 2018-04-19 ENCOUNTER — Ambulatory Visit (INDEPENDENT_AMBULATORY_CARE_PROVIDER_SITE_OTHER): Payer: Medicare Other | Admitting: Internal Medicine

## 2018-04-19 VITALS — BP 124/68 | HR 92 | Temp 98.1°F | Ht 66.0 in | Wt 177.2 lb

## 2018-04-19 DIAGNOSIS — Z7982 Long term (current) use of aspirin: Secondary | ICD-10-CM | POA: Diagnosis not present

## 2018-04-19 DIAGNOSIS — D500 Iron deficiency anemia secondary to blood loss (chronic): Secondary | ICD-10-CM

## 2018-04-19 DIAGNOSIS — F329 Major depressive disorder, single episode, unspecified: Secondary | ICD-10-CM

## 2018-04-19 DIAGNOSIS — E1140 Type 2 diabetes mellitus with diabetic neuropathy, unspecified: Secondary | ICD-10-CM

## 2018-04-19 DIAGNOSIS — R55 Syncope and collapse: Secondary | ICD-10-CM | POA: Diagnosis not present

## 2018-04-19 DIAGNOSIS — F17210 Nicotine dependence, cigarettes, uncomplicated: Secondary | ICD-10-CM | POA: Diagnosis not present

## 2018-04-19 DIAGNOSIS — W19XXXA Unspecified fall, initial encounter: Secondary | ICD-10-CM

## 2018-04-19 DIAGNOSIS — Z8719 Personal history of other diseases of the digestive system: Secondary | ICD-10-CM

## 2018-04-19 DIAGNOSIS — S301XXA Contusion of abdominal wall, initial encounter: Secondary | ICD-10-CM | POA: Diagnosis not present

## 2018-04-19 DIAGNOSIS — I10 Essential (primary) hypertension: Secondary | ICD-10-CM | POA: Diagnosis not present

## 2018-04-19 DIAGNOSIS — R296 Repeated falls: Secondary | ICD-10-CM | POA: Insufficient documentation

## 2018-04-19 HISTORY — DX: Unspecified fall, initial encounter: W19.XXXA

## 2018-04-19 MED ORDER — TRAMADOL HCL 50 MG PO TABS: 50.0000 mg | ORAL_TABLET | Freq: Two times a day (BID) | ORAL | 0 refills | Status: AC

## 2018-04-19 NOTE — Assessment & Plan Note (Signed)
HPI: [name] reports that she fell 1.5 weeks ago when she became lightheaded while walking in her house. This episode was consistent with her usual syncopal episodes likely from autonomic dysfunction. Her left side fell onto a hardwood floor. She states she was couch-bound for days after the fall because the pain on her left-side was severe. The pain has improved some in the past few days and she has been more mobile. However, she has been unable to sleep because of the pain when she lies down in her recliner. She has not tried tylenol or NSAIDs for the pain as she avoids these medications for a history of gastric ulcers as advised by her GI doc. She is on aspirin, but no other blood thinners.   Assessment: On exam there is a large ecchymoses wrapping around the lateral left abdomen to the left flank with a 2x2cm lump above the bruise consistent with hematoma. Her last Hb two months ago was 14, so I do not think she is at risk of anemia with this bleed. Low concern for fractured rib as her pain is inferior to the rib cage. The hematoma and ecchymoses should resorb and fade with time. I recommended heating pads and ice for symptomatic management. I will also provide a 3-day supply of tramadol for acute pain. I discussed the risk of sedation with the patient and the need for extra vigilance to avoid syncope while on the tramadol.  Plan 1. Tramadol 50mg  BID PRN for 3 days.

## 2018-04-19 NOTE — Assessment & Plan Note (Signed)
Syncopal episodes are stable according to the patient with about two episodes per month. She reports no improvement in lightheadedness on the lower dose of gabapentin. She has follow-up with EP on 11/18. Syncope is most likely due to autonomic dysfunction.  Plan 1. F/u with EP on 11/18

## 2018-04-19 NOTE — Progress Notes (Signed)
   CC: left-sided pain s/p fall  HPI:   [name] is a 67 y.o. diabetes mellitus type 2 and associated neuropathy, hypertension, iron deficiency anemia due to chronic blood loss, and major depressive disorder as well as the conditions listed below who presents to the internal medicine clinic for left-sided pain after a fall. Please see problem based charting for the status of the patient's current and chronic medical conditions.   Past Medical History:  Diagnosis Date  . Allergy   . Anemia   . Arthritis    "left knee" (09/08/2015)  . Asthma   . Blood transfusion without reported diagnosis 10/2017   anemic  . Brachial plexus disorders   . CHF (congestive heart failure) ([place])   . Chronic pain    neck pain, headache, neuropathy  . COPD (chronic obstructive pulmonary disease) ([place])    SEES ONLY DR. PATEL   . Depression    "when my husband passed in 2013"  . Gastric ulcer   . GERD (gastroesophageal reflux disease)   . Hypertension   . Hypertriglyceridemia   . Migraine    "monthly" (09/08/2015)  . Neuromuscular disorder (HCC)    neuropathy  . Puncture wound of foot, right 05/17/2012   Tetanus shot 3 yrs ago at [facility] in [place], per pt report   . Stroke ([place])    TIAS    IN [place]   4 YRS AGO   . Type II diabetes mellitus ([place]) 2010   diagnosed around 2010, only ever on metformin    Review of Systems:   Pertinent positives mentioned in HPI. Remainder of all ROS negative.   Physical Exam: Vitals:   04/19/18 1350  BP: 124/68  Pulse: 92  Temp: 98.1 F (36.7 C)  TempSrc: Oral  SpO2: 97%  Weight: 177 lb 3.2 oz (80.4 kg)  Height: 5\' 6"  (1.676 m)   Physical Exam  Constitutional: Well-developed, well-nourished, and in no distress.  Eyes: Pupils are equal, round, and reactive to light. EOM are normal.  Cardiovascular: Normal rate and regular rhythm. No murmurs, rubs, or gallops. Pulmonary/Chest: Effort normal. Clear to auscultation bilaterally. No wheezes,  rales, or rhonchi. Abdominal: Bowel sounds present. Soft, non-distended. Large ecchymoses wrapping around the lateral left abdomen to the left flank. There is a 2x2cm lump above the bruise consistent with hematoma. Mild TTP at the location of the lump. Skin: Warm and dry.    Assessment & Plan:   See Encounters Tab for problem based charting.  Patient seen with Dr. Daryll Drown

## 2018-04-19 NOTE — Patient Instructions (Signed)
It was a pleasure taking care of you today, Ms. Gear! And happy early birthday!  The lump under your skin is consistent with a collection of blood from the bruising. This will take time to go away completely as will the bruising. You can treat the area with heating pads or ice. Avoid any strenuous activity. I have written a prescription for tramadol. You can take 50mg  twice a day. This medicine might cause you to be sleepy, so be extra vigilant to avoid falls.   Feel free to call our clinic at [phone] if you have any questions.  Thanks, Dr. Annie Paras

## 2018-04-20 NOTE — Progress Notes (Signed)
Internal Medicine Clinic Attending  I saw and evaluated the patient.  I personally confirmed the key portions of the history and exam documented by Dr. Dorrell and I reviewed pertinent patient test results.  The assessment, diagnosis, and plan were formulated together and I agree with the documentation in the resident's note. 
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
## 2018-04-30 ENCOUNTER — Ambulatory Visit (INDEPENDENT_AMBULATORY_CARE_PROVIDER_SITE_OTHER): Payer: Medicare Other | Admitting: Physician Assistant

## 2018-04-30 VITALS — BP 147/85 | HR 73 | Ht 66.0 in | Wt 175.0 lb

## 2018-04-30 DIAGNOSIS — R55 Syncope and collapse: Secondary | ICD-10-CM

## 2018-04-30 DIAGNOSIS — I248 Other forms of acute ischemic heart disease: Secondary | ICD-10-CM

## 2018-04-30 NOTE — Patient Instructions (Addendum)
Medication Instructions:  NONE ORDERED  TODAY  If you need a refill on your cardiac medications before your next appointment, please call your pharmacy.   Lab work: NONE ORDERED  TODAY  If you have labs (blood work) drawn today and your tests are completely normal, you will receive your results only by: Marland Kitchen MyChart Message (if you have MyChart) OR . A paper copy in the mail If you have any lab test that is abnormal or we need to change your treatment, we will call you to review the results.  Testing/Procedures: NONE ORDERED  TODAY   Follow-Up:   You have been referred to  [facility], you and your health needs are our priority.  As part of our continuing mission to provide you with exceptional heart care, we have created designated Provider Care Teams.  These Care Teams include your primary Cardiologist (physician) and Advanced Practice Providers (APPs -  Physician Assistants and Nurse Practitioners) who all work together to provide you with the care you need, when you need it. You will need a follow up appointment in 6 months.  Please call our office 2 months in advance to schedule this appointment.  You may see Dr. Rayann Heman or one of the following Advanced Practice Providers on your designated Care Team:   Chanetta Marshall, NP . Tommye Standard, PA-C  Any Other Special Instructions Will Be Listed Below (If Applicable).

## 2018-05-01 ENCOUNTER — Encounter: Payer: Self-pay | Admitting: Neurology

## 2018-05-03 ENCOUNTER — Ambulatory Visit: Payer: Medicare Other | Admitting: Podiatry

## 2018-05-08 ENCOUNTER — Ambulatory Visit (INDEPENDENT_AMBULATORY_CARE_PROVIDER_SITE_OTHER): Payer: Medicare Other | Admitting: Neurology

## 2018-05-08 ENCOUNTER — Other Ambulatory Visit: Payer: Self-pay

## 2018-05-08 ENCOUNTER — Encounter: Payer: Self-pay | Admitting: Neurology

## 2018-05-08 VITALS — BP 126/70 | HR 87 | Ht 66.0 in | Wt 171.0 lb

## 2018-05-08 DIAGNOSIS — I248 Other forms of acute ischemic heart disease: Secondary | ICD-10-CM | POA: Diagnosis not present

## 2018-05-08 DIAGNOSIS — G629 Polyneuropathy, unspecified: Secondary | ICD-10-CM

## 2018-05-08 DIAGNOSIS — R55 Syncope and collapse: Secondary | ICD-10-CM
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
## 2018-05-08 NOTE — Patient Instructions (Addendum)
1. Schedule MRI brain with and without contrast 2. Schedule MRA head without contrast 3. Schedule MRA neck with and without contrast  We have sent a referral to [facility] for your MRI and they will call you directly to schedule your appt. They are located at [place]. If you need to contact them directly please call [phone].   4. Schedule 1-hour EEG 5. Schedule EMG/NCV of the right UE and LE 6. Follow-up in 6 months, call for any changes

## 2018-05-13 DIAGNOSIS — S0993XA Unspecified injury of face, initial encounter: Secondary | ICD-10-CM

## 2018-05-13 HISTORY — DX: Unspecified injury of face, initial encounter: S09.93XA

## 2018-05-14 ENCOUNTER — Ambulatory Visit (INDEPENDENT_AMBULATORY_CARE_PROVIDER_SITE_OTHER): Payer: Medicare Other | Admitting: Neurology

## 2018-05-14 DIAGNOSIS — R55 Syncope and collapse: Secondary | ICD-10-CM

## 2018-05-14 DIAGNOSIS — G629 Polyneuropathy, unspecified: Secondary | ICD-10-CM

## 2018-05-15 DIAGNOSIS — Z96652 Presence of left artificial knee joint: Secondary | ICD-10-CM | POA: Diagnosis not present

## 2018-05-15 DIAGNOSIS — M25512 Pain in left shoulder: Secondary | ICD-10-CM | POA: Insufficient documentation

## 2018-05-15 DIAGNOSIS — Z471 Aftercare following joint replacement surgery: Secondary | ICD-10-CM | POA: Diagnosis not present

## 2018-05-16 NOTE — Procedures (Signed)
ELECTROENCEPHALOGRAM REPORT  Date of Study: 05/14/2018  Patient's Name: [name] MRN: [id number] Date of Birth: 1951-05-09  Referring Provider: Dr. Ellouise Newer  Clinical History: This is a 67 year old woman with recurrent syncope.   Medications: NEURONTIN 300 MG capsule  PROVENTIL HFA;VENTOLIN HFA 108 (90 Base) MCG/ACT inhaler  aspirin EC 81 MG tablet  GLUMETZA 1000 MG (MOD) 24 hr tablet  PROTONIX 40 MG tablet  CRESTOR 40 MG tablet  ZOLOFT 100 MG tablet  JANUVIA 50 MG tablet  Technical Summary: A multichannel digital 1-hour EEG recording measured by the international 10-20 system with electrodes applied with paste and impedances below 5000 ohms performed in our laboratory with EKG monitoring in an awake and drowsy patient.  Hyperventilation was not performed. Photic stimulation was performed.  The digital EEG was referentially recorded, reformatted, and digitally filtered in a variety of bipolar and referential montages for optimal display.    Description: The patient is awake and drowsy during the recording.  During maximal wakefulness, there is a symmetric, medium voltage 8-8.5 Hz posterior dominant rhythm that attenuates with eye opening.  The record is symmetric.  During drowsiness, there is an increase in theta slowing of the background. Deeper stages of sleep were not seen. Photic stimulation did not elicit any abnormalities.  There were no epileptiform discharges or electrographic seizures seen.    EKG lead was unremarkable.  Impression: This 1-hour awake and drowsy EEG is normal.    Clinical Correlation: A normal EEG does not exclude a clinical diagnosis of epilepsy.  If further clinical questions remain, prolonged EEG may be helpful.  Clinical correlation is advised.   Ellouise Newer, M.D.

## 2018-05-17 ENCOUNTER — Ambulatory Visit (INDEPENDENT_AMBULATORY_CARE_PROVIDER_SITE_OTHER): Payer: Medicare Other

## 2018-05-17 ENCOUNTER — Ambulatory Visit (INDEPENDENT_AMBULATORY_CARE_PROVIDER_SITE_OTHER): Payer: Medicare Other | Admitting: Podiatry

## 2018-05-17 DIAGNOSIS — L84 Corns and callosities: Secondary | ICD-10-CM | POA: Diagnosis not present

## 2018-05-17 DIAGNOSIS — E1142 Type 2 diabetes mellitus with diabetic polyneuropathy: Secondary | ICD-10-CM

## 2018-05-17 DIAGNOSIS — L97519 Non-pressure chronic ulcer of other part of right foot with unspecified severity: Secondary | ICD-10-CM

## 2018-05-17 DIAGNOSIS — E11621 Type 2 diabetes mellitus with foot ulcer: Secondary | ICD-10-CM

## 2018-05-17 NOTE — Patient Instructions (Signed)
Corns and Calluses Corns are small areas of thickened skin that occur on the top, sides, or tip of a toe. They contain a cone-shaped core with a point that can press on a nerve below. This causes pain. Calluses are areas of thickened skin that can occur anywhere on the body including hands, fingers, palms, soles of the feet, and heels.Calluses are usually larger than corns. What are the causes? Corns and calluses are caused by rubbing (friction) or pressure, such as from shoes that are too tight or do not fit properly. What increases the risk? Corns are more likely to develop in people who have toe deformities, such as hammer toes. Since calluses can occur with friction to any area of the skin, calluses are more likely to develop in people who:  Work with their hands.  Wear shoes that fit poorly, shoes that are too tight, or shoes that are high-heeled.  Have toes deformities.  What are the signs or symptoms? Symptoms of a corn or callus include:  A hard growth on the skin.  Pain or tenderness under the skin.  Redness and swelling.  Increased discomfort while wearing tight-fitting shoes.  How is this diagnosed? Corns and calluses may be diagnosed with a medical history and physical exam. How is this treated? Corns and calluses may be treated with:  Removing the cause of the friction or pressure. This may include: ? Changing your shoes. ? Wearing shoe inserts (orthotics) or other protective layers in your shoes, such as a corn pad. ? Wearing gloves.  Medicines to help soften skin in the hardened, thickened areas.  Reducing the size of the corn or callus by removing the dead layers of skin.  Antibiotic medicines to treat infection.  Surgery, if a toe deformity is the cause.  Follow these instructions at home:  Take medicines only as directed by your health care provider.  If you were prescribed an antibiotic, finish all of it even if you start to feel better.  Wear  shoes that fit well. Avoid wearing high-heeled shoes and shoes that are too tight or too loose.  Wear any padding, protective layers, gloves, or orthotics as directed by your health care provider.  Soak your hands or feet and then use a file or pumice stone to soften your corn or callus. Do this as directed by your health care provider.  Check your corn or callus every day for signs of infection. Watch for: ? Redness, swelling, or pain. ? Fluid, blood, or pus. Contact a health care provider if:  Your symptoms do not improve with treatment.  You have increased redness, swelling, or pain at the site of your corn or callus.  You have fluid, blood, or pus coming from your corn or callus.  You have new symptoms. This information is not intended to replace advice given to you by your health care provider. Make sure you discuss any questions you have with your health care provider. Document Released: 03/05/2004 Document Revised: 12/18/2015 Document Reviewed: 05/26/2014 Elsevier Interactive Patient Education  2018 [employer].  Diabetes and Foot Care Diabetes may cause you to have problems because of poor blood supply (circulation) to your feet and legs. This may cause the skin on your feet to become thinner, break easier, and heal more slowly. Your skin may become dry, and the skin may peel and crack. You may also have nerve damage in your legs and feet causing decreased feeling in them. You may not notice minor injuries to your  feet that could lead to infections or more serious problems. Taking care of your feet is one of the most important things you can do for yourself. Follow these instructions at home:  Wear shoes at all times, even in the house. Do not go barefoot. Bare feet are easily injured.  Check your feet daily for blisters, cuts, and redness. If you cannot see the bottom of your feet, use a mirror or ask someone for help.  Wash your feet with warm water (do not use hot water)  and mild soap. Then pat your feet and the areas between your toes until they are completely dry. Do not soak your feet as this can dry your skin.  Apply a moisturizing lotion or petroleum jelly (that does not contain alcohol and is unscented) to the skin on your feet and to dry, brittle toenails. Do not apply lotion between your toes.  Trim your toenails straight across. Do not dig under them or around the cuticle. File the edges of your nails with an emery board or nail file.  Do not cut corns or calluses or try to remove them with medicine.  Wear clean socks or stockings every day. Make sure they are not too tight. Do not wear knee-high stockings since they may decrease blood flow to your legs.  Wear shoes that fit properly and have enough cushioning. To break in new shoes, wear them for just a few hours a day. This prevents you from injuring your feet. Always look in your shoes before you put them on to be sure there are no objects inside.  Do not cross your legs. This may decrease the blood flow to your feet.  If you find a minor scrape, cut, or break in the skin on your feet, keep it and the skin around it clean and dry. These areas may be cleansed with mild soap and water. Do not cleanse the area with peroxide, alcohol, or iodine.  When you remove an adhesive bandage, be sure not to damage the skin around it.  If you have a wound, look at it several times a day to make sure it is healing.  Do not use heating pads or hot water bottles. They may burn your skin. If you have lost feeling in your feet or legs, you may not know it is happening until it is too late.  Make sure your health care provider performs a complete foot exam at least annually or more often if you have foot problems. Report any cuts, sores, or bruises to your health care provider immediately. Contact a health care provider if:  You have an injury that is not healing.  You have cuts or breaks in the skin.  You have  an ingrown nail.  You notice redness on your legs or feet.  You feel burning or tingling in your legs or feet.  You have pain or cramps in your legs and feet.  Your legs or feet are numb.  Your feet always feel cold. Get help right away if:  There is increasing redness, swelling, or pain in or around a wound.  There is a red line that goes up your leg.  Pus is coming from a wound.  You develop a fever or as directed by your health care provider.  You notice a bad smell coming from an ulcer or wound. This information is not intended to replace advice given to you by your health care provider. Make sure you discuss any questions  you have with your health care provider. Document Released: 05/27/2000 Document Revised: 11/05/2015 Document Reviewed: 11/06/2012 Elsevier Interactive Patient Education  2017 [employer]  Diabetic Neuropathy Diabetic neuropathy is a nerve disease or nerve damage that is caused by diabetes mellitus. About half of all people with diabetes mellitus have some form of nerve damage. Nerve damage is more common in those who have had diabetes mellitus for many years and who generally have not had good control of their blood sugar (glucose) level. Diabetic neuropathy is a common complication of diabetes mellitus. There are three common types of diabetic neuropathy and a fourth type that is less common and less understood:  Peripheral neuropathy-This is the most common type of diabetic neuropathy. It causes damage to the nerves of the feet and legs first and then eventually the hands and arms. The damage affects the ability to sense touch.  Autonomic neuropathy-This type causes damage to the autonomic nervous system, which controls the following functions: ? Heartbeat. ? Body temperature. ? Blood pressure. ? Urination. ? Digestion. ? Sweating. ? Sexual function.  Focal neuropathy-Focal neuropathy can be painful and unpredictable and occurs most often in older  adults with diabetes mellitus. It involves a specific nerve or one area and often comes on suddenly. It usually does not cause long-term problems.  Radiculoplexus neuropathy- Sometimes called lumbosacral radiculoplexus neuropathy, radiculoplexus neuropathy affects the nerves of the thighs, hips, buttocks, or legs. It is more common in people with type 2 diabetes mellitus and in older men. It is characterized by debilitating pain, weakness, and atrophy, usually in the thigh muscles.  What are the causes? The cause of peripheral, autonomic, and focal neuropathies is diabetes mellitus that is uncontrolled and high glucose levels. The cause of radiculoplexus neuropathy is unknown. However, it is thought to be caused by inflammation related to uncontrolled glucose levels. What are the signs or symptoms? Peripheral Neuropathy Peripheral neuropathy develops slowly over time. When the nerves of the feet and legs no longer work there may be:  Burning, stabbing, or aching pain in the legs or feet.  Inability to feel pressure or pain in your feet. This can lead to: ? Thick calluses over pressure areas. ? Pressure sores. ? Ulcers.  Foot deformities.  Reduced ability to feel temperature changes.  Muscle weakness.  Autonomic Neuropathy The symptoms of autonomic neuropathy vary depending on which nerves are affected. Symptoms may include:  Problems with digestion, such as: ? Feeling sick to your stomach (nausea). ? Vomiting. ? Bloating. ? Constipation. ? Diarrhea. ? Abdominal pain.  Difficulty with urination. This occurs if you lose your ability to sense when your bladder is full. Problems include: ? Urine leakage (incontinence). ? Inability to empty your bladder completely (retention).  Rapid or irregular heartbeat (palpitations).  Blood pressure drops when you stand up (orthostatic hypotension). When you stand up you may feel: ? Dizzy. ? Weak. ? Faint.  In men, inability to attain and  maintain an erection.  In women, vaginal dryness and problems with decreased sexual desire and arousal.  Problems with body temperature regulation.  Increased or decreased sweating.  Focal Neuropathy  Abnormal eye movements or abnormal alignment of both eyes.  Weakness in the wrist.  Foot drop. This results in an inability to lift the foot properly and abnormal walking or foot movement.  Paralysis on one side of your face (Bell palsy).  Chest or abdominal pain. Radiculoplexus Neuropathy  Sudden, severe pain in your hip, thigh, or buttocks.  Weakness and wasting  of thigh muscles.  Difficulty rising from a seated position.  Abdominal swelling.  Unexplained weight loss (usually more than 10 lb [4.5 kg]). How is this diagnosed? Peripheral Neuropathy Your senses may be tested. Sensory function testing can be done with:  A light touch using a monofilament.  A vibration with tuning fork.  A sharp sensation with a pin prick.  Other tests that can help diagnose neuropathy are:  Nerve conduction velocity. This test checks the transmission of an electrical current through a nerve.  Electromyography. This shows how muscles respond to electrical signals transmitted by nearby nerves.  Quantitative sensory testing. This is used to assess how your nerves respond to vibrations and changes in temperature.  Autonomic Neuropathy Diagnosis is often based on reported symptoms. Tell your health care provider if you experience:  Dizziness.  Constipation.  Diarrhea.  Inappropriate urination or inability to urinate.  Inability to get or maintain an erection.  Tests that may be done include:  Electrocardiography or Holter monitor. These are tests that can help show problems with the heart rate or heart rhythm.  An X-ray exam may be done.  Focal Neuropathy Diagnosis is made based on your symptoms and what your health care provider finds during your exam. Other tests may be  done. They may include:  Nerve conduction velocities. This checks the transmission of electrical current through a nerve.  Electromyography. This shows how muscles respond to electrical signals transmitted by nearby nerves.  Quantitative sensory testing. This test is used to assess how your nerves respond to vibration and changes in temperature.  Radiculoplexus Neuropathy  Often the first thing is to eliminate any other issue or problems that might be the cause, as there is no standard test for diagnosis.  X-ray exam of your spine and lumbar region.  Spinal tap to rule out cancer.  MRI to rule out other lesions. How is this treated? Once nerve damage occurs, it cannot be reversed. The goal of treatment is to keep the disease or nerve damage from getting worse and affecting more nerve fibers. Controlling your blood glucose level is the key. Most people with radiculoplexus neuropathy see at least a partial improvement over time. You will need to keep your blood glucose and HbA1c levels in the target range determined by your health care provider. Things that help control blood glucose levels include:  Blood glucose monitoring.  Meal planning.  Physical activity.  Diabetes medicine.  Over time, maintaining lower blood glucose levels helps lessen symptoms. Sometimes, prescription pain medicine is needed. Follow these instructions at home:  Do not smoke.  Keep your blood glucose level in the range that you and your health care provider have determined acceptable for you.  Keep your blood pressure level in the range that you and your health care provider have determined acceptable for you.  Eat a well-balanced diet.  Be physically active every day. Include strength training and balance exercises.  Protect your feet. ? Check your feet every day for sores, cuts, blisters, or signs of infection. ? Wear padded socks and supportive shoes. Use orthotic inserts, if  necessary. ? Regularly check the insides of your shoes for worn spots. Make sure there are no rocks or other items inside your shoes before you put them on. Contact a health care provider if:  You have burning, stabbing, or aching pain in the legs or feet.  You are unable to feel pressure or pain in your feet.  You develop problems with digestion such  as: ? Nausea. ? Vomiting. ? Bloating. ? Constipation. ? Diarrhea. ? Abdominal pain.  You have difficulty with urination, such as: ? Incontinence. ? Retention.  You have palpitations.  You develop orthostatic hypotension. When you stand up you may feel: ? Dizzy. ? Weak. ? Faint.  You cannot attain and maintain an erection (in men).  You have vaginal dryness and problems with decreased sexual desire and arousal (in women).  You have severe pain in your thighs, legs, or buttocks.  You have unexplained weight loss. This information is not intended to replace advice given to you by your health care provider. Make sure you discuss any questions you have with your health care provider. Document Released: 08/08/2001 Document Revised: 11/05/2015 Document Reviewed: 11/08/2012 Elsevier Interactive Patient Education  2017 [employer].

## 2018-05-27 ENCOUNTER — Ambulatory Visit
Admission: RE | Admit: 2018-05-27 | Discharge: 2018-05-27 | Disposition: A | Discharge status: Home / Self Care | Payer: Medicare Other | Source: Ambulatory Visit | Attending: Neurology | Admitting: Neurology

## 2018-05-27 DIAGNOSIS — G629 Polyneuropathy, unspecified: Secondary | ICD-10-CM

## 2018-05-27 DIAGNOSIS — R55 Syncope and collapse: Secondary | ICD-10-CM

## 2018-05-27 DIAGNOSIS — R51 Headache: Secondary | ICD-10-CM | POA: Diagnosis not present

## 2018-05-27 MED ORDER — GADOBENATE DIMEGLUMINE 529 MG/ML IV SOLN
16.0000 mL | Freq: Once | INTRAVENOUS | Status: AC | PRN
  Administered 2018-05-27: 16 mL via INTRAVENOUS

## 2018-05-29 ENCOUNTER — Ambulatory Visit (INDEPENDENT_AMBULATORY_CARE_PROVIDER_SITE_OTHER): Payer: Medicare Other | Admitting: Neurology

## 2018-05-29 DIAGNOSIS — R55 Syncope and collapse: Secondary | ICD-10-CM

## 2018-05-29 DIAGNOSIS — G629 Polyneuropathy, unspecified: Secondary | ICD-10-CM

## 2018-05-29 NOTE — Procedures (Signed)
[facility]  [address]  [place], [place] [address] Tel: [phone] Fax:  [phone] Test Date:  05/29/2018  Patient: [name] DOB: 02-13-1951 Physician: Narda Amber, DO  Sex: Female Height: 5\' 6"  Ref Phys: Ellouise Newer, MD  ID#: [id number] Temp: 32.0C Technician:    Patient Complaints: This is a 67 year old female with history of diabetes referred for evaluation of bilateral hand and feet paresthesias.  NCV & EMG Findings: Extensive electrodiagnostic testing of the right upper and lower extremity shows:  1. Right median, ulnar, and mixed palmar sensory responses are within normal limits. 2. Right sural and superficial peroneal sensory responses are absent. 3. Right median motor response is within normal limits, of note, there is evidence of a median-to-ulnar crossover in the forearm as evidenced by a motor response when stimulating at the ulnar-wrist, recording at the abductor pollicis brevis, consistent with a Martin-Gruber anastomosis.  Right ulnar motor response shows decreased conduction velocity across the elbow (A Elbow-B Elbow, 36 m/s).   4. Right peroneal motor response is absent at the extensor digitorum brevis, and normal at the tibialis anterior.  Right tibial motor response shows reduced amplitude (1.5 mV).   5. Right tibial H reflex study is within normal limits. 6. In the right upper extremity, there is no evidence of active or chronic motor axonal loss changes affecting any of the tested muscles. 7. In the right lower extremity, chronic motor axonal loss changes are seen affecting the muscles below the knee, without accompanied active denervation.  Impression: 1. 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  Overall, these findings are severe in degree electrically. 2. Right ulnar neuropathy with slowing across the elbow, purely demyelinating type,  and mild in degree electrically. 3. 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   ___________________________ Narda Amber, DO    Nerve Conduction Studies Anti Sensory Summary Table   Site NR Peak (ms) Norm Peak (ms) P-T Amp (V) Norm P-T Amp  Right Median Anti Sensory (2nd Digit)  32C  Wrist    3.2 <3.8 18.7 >10  Right Sup Peroneal Anti Sensory (Ant Lat Mall)  32C  12 cm NR  <4.6  >3  Right Sural Anti Sensory (Lat Mall)  32C  Calf NR  <4.6  >3  Right Ulnar Anti Sensory (5th Digit)  32C  Wrist    2.6 <3.2 20.1 >5   Motor Summary Table   Site NR Onset (ms) Norm Onset (ms) O-P Amp (mV) Norm O-P Amp Site1 Site2 Delta-0 (ms) Dist (cm) Vel (m/s) Norm Vel (m/s)  Right Median Motor (Abd Poll Brev)  32C  Wrist    3.8 <4.0 5.5 >5 Elbow Wrist 5.5 30.0 55 >50  Elbow    9.3  5.0  Ulnar-wrist crossover Elbow 5.0 0.0    Ulnar-wrist crossover    4.3  2.7         Right Peroneal Motor (Ext Dig Brev)  32C  Ankle NR  <6.0  >2.5 B Fib Ankle  0.0  >40  B Fib NR     Poplt B Fib  0.0  >40  Poplt NR            Right Peroneal TA Motor (Tib Ant)  32C  Fib Head    3.1 <4.5 5.3 >3 Poplit Fib Head 1.3 8.0 62 >40  Poplit    4.4  5.1  Right Tibial Motor (Abd Hall Brev)  32C  Ankle    4.1 <6.0 1.5 >4 Knee Ankle 9.1 38.0 42 >40  Knee    13.2  1.2         Right Ulnar Motor (Abd Dig Minimi)  32C  Wrist    2.0 <3.1 7.6 >7 B Elbow Wrist 3.9 22.0 56 >50  B Elbow    5.9  7.4  A Elbow B Elbow 2.8 10.0 36 >50  A Elbow    8.7  6.8          Comparison Summary Table   Site NR Peak (ms) Norm Peak (ms) P-T Amp (V) Site1 Site2 Delta-P (ms) Norm Delta (ms)  Right Median/Ulnar Palm Comparison (Wrist - 8cm)  32C  Median Palm    2.0 <2.2 25.1 Median Palm Ulnar Palm 0.3   Ulnar Palm    1.7 <2.2 5.6       H Reflex Studies   NR H-Lat (ms) Lat Norm (ms) L-R H-Lat (ms)  Right Tibial (Gastroc)  32C     34.01 <35    EMG   Side Muscle Ins Act Fibs Psw Fasc Number  Recrt Dur Dur. Amp Amp. Poly Poly. Comment  Right 1stDorInt Nml Nml Nml Nml Nml Nml Nml Nml Nml Nml Nml Nml N/A  Right Ext Indicis Nml Nml Nml Nml Nml Nml Nml Nml Nml Nml Nml Nml N/A  Right PronatorTeres Nml Nml Nml Nml Nml Nml Nml Nml Nml Nml Nml Nml N/A  Right Biceps Nml Nml Nml Nml Nml Nml Nml Nml Nml Nml Nml Nml N/A  Right Deltoid Nml Nml Nml Nml Nml Nml Nml Nml Nml Nml Nml Nml N/A  Right Triceps Nml Nml Nml Nml Nml Nml Nml Nml Nml Nml Nml Nml N/A  Right ABD Dig Min Nml Nml Nml Nml Nml Nml Nml Nml Nml Nml Nml Nml N/A  Right AntTibialis Nml Nml Nml Nml 1- Rapid Some 1+ Some 1+ Some 1+ N/A  Right Gastroc Nml Nml Nml Nml 1- Rapid Some 1+ Some 1+ Nml Nml N/A  Right Flex Dig Long Nml Nml Nml Nml 1- Rapid Some 1+ Some 1+ Some 1+ N/A  Right RectFemoris Nml Nml Nml Nml Nml Nml Nml Nml Nml Nml Nml Nml N/A  Right GluteusMed Nml Nml Nml Nml Nml Nml Nml Nml Nml Nml Nml Nml N/A  Right BicepsFemS Nml Nml Nml Nml Nml Nml Nml Nml Nml Nml Nml Nml N/A      Waveforms:

## 2018-05-31 ENCOUNTER — Telehealth: Payer: Self-pay

## 2018-05-31 NOTE — Telephone Encounter (Signed)
Spoke with pt relaying EEG, MRI and EMG results.

## 2018-06-05 ENCOUNTER — Other Ambulatory Visit: Payer: Self-pay | Admitting: Internal Medicine

## 2018-06-05 DIAGNOSIS — E118 Type 2 diabetes mellitus with unspecified complications: Secondary | ICD-10-CM

## 2018-06-05 DIAGNOSIS — F322 Major depressive disorder, single episode, severe without psychotic features: Secondary | ICD-10-CM

## 2018-06-08 NOTE — Telephone Encounter (Signed)
Next appt scheduled 07/20/18 with PCP. 

## 2018-06-13 ENCOUNTER — Encounter: Payer: Self-pay | Admitting: Podiatry

## 2018-06-13 NOTE — Progress Notes (Addendum)
Subjective: [name] is a 68 y.o. y.o. female who presents for preventative foot care today with diabetes, diabetic neuropathy.   She has h/o ulceration  submetatarsal head 5 right foot. She relates tenderness to the area today. She denies any drainage from the area.   Lars Mage, MD  Is her PCP and last dos was 03/02/2018.    Current Outpatient Medications:  .  albuterol (PROVENTIL HFA;VENTOLIN HFA) 108 (90 Base) MCG/ACT inhaler, Inhale 1-2 puffs into the lungs every 6 (six) hours as needed for wheezing or shortness of breath., Disp: 1 Inhaler, Rfl: 5 .  amLODipine (NORVASC) 5 MG tablet, amlodipine besylate 5 mg tabs, Disp: , Rfl:  .  aspirin EC 81 MG tablet, Take 81 mg by mouth daily., Disp: , Rfl:  .  Cyanocobalamin (VITAMIN B-12) 1000 MCG/15ML LIQD, Vitamin B-12  1,000 mcg tablet, Disp: , Rfl:  .  diclofenac sodium (VOLTAREN) 1 % GEL, diclofenac sodium 1 % gel, Disp: , Rfl:  .  ferrous sulfate 325 (65 FE) MG tablet, ferrous sulfate 325 mg (65 mg iron) tablet, Disp: , Rfl:  .  gabapentin (NEURONTIN) 300 MG capsule, TAKE 1 CAPSULE BY MOUTH THREE TIMES DAILY, Disp: 90 capsule, Rfl: 5 .  gabapentin (NEURONTIN) 300 MG capsule, gabapentin 300 mg caps, Disp: , Rfl:  .  gabapentin (NEURONTIN) 400 MG capsule, gabapentin 400 mg caps, Disp: , Rfl:  .  glipiZIDE (GLUCOTROL XL) 5 MG 24 hr tablet, glipizide er 5 mg tb24, Disp: , Rfl:  .  glucose blood (ONETOUCH VERIO) test strip, Check blood sugar 3x/day, Disp: 100 each, Rfl: 3 .  glucose blood test strip, OneTouch Verio strips  CHECK BLOOD SUGAR 3 TIMES A DAY, Disp: , Rfl:  .  Insulin Pen Needle (UNIFINE PENTIPS) 32G X 4 MM MISC, Unifine Pentips 32 gauge x 5/32" needle  USE AS DIRECTED BY PRESCRIBER, Disp: , Rfl:  .  JANUVIA 50 MG tablet, TAKE 1 TABLET(50 MG) BY MOUTH DAILY, Disp: 90 tablet, Rfl: 0 .  liraglutide (VICTOZA) 18 MG/3ML SOPN, victoza 18 mg/72ml sopn, Disp: , Rfl:  .  lisinopril (PRINIVIL,ZESTRIL) 10 MG tablet, lisinopril 10 mg  tabs, Disp: , Rfl:  .  meloxicam (MOBIC) 7.5 MG tablet, meloxicam 7.5 mg tablet, Disp: , Rfl:  .  metFORMIN (GLUMETZA) 1000 MG (MOD) 24 hr tablet, metformin hydrochloride er 1000 mgtb24, Disp: , Rfl:  .  metFORMIN (GLUMETZA) 1000 MG (MOD) 24 hr tablet, TAKE 1 TABLET BY MOUTH TWICE DAILY, Disp: 180 tablet, Rfl: 0 .  pantoprazole (PROTONIX) 40 MG tablet, Take 1 tablet (40 mg total) by mouth 2 (two) times daily. TAKE 1 TABLET(40 MG) BY MOUTH twice per day.  Before 1st and last meal of the day., Disp: 60 tablet, Rfl: 12 .  pantoprazole (PROTONIX) 40 MG tablet, pantoprazole sodium 40 mg tbec, Disp: , Rfl:  .  rosuvastatin (CRESTOR) 40 MG tablet, rosuvastatin calcium 40 mg tabs, Disp: , Rfl:  .  rosuvastatin (CRESTOR) 40 MG tablet, TAKE 1 TABLET BY MOUTH EVERY DAY, Disp: 90 tablet, Rfl: 0 .  sertraline (ZOLOFT) 100 MG tablet, sertraline hydrochloride 100 mg tabs, Disp: , Rfl:  .  sertraline (ZOLOFT) 100 MG tablet, TAKE 1 TABLET(100 MG) BY MOUTH DAILY, Disp: 90 tablet, Rfl: 0 .  sitaGLIPtin (JANUVIA) 50 MG tablet, januvia 50 mg tabs, Disp: , Rfl:  .  traMADol (ULTRAM) 50 MG tablet, tramadol 50 mg tablet, Disp: , Rfl:   Allergies  Allergen Reactions  . Anesthesia S-I-60 Other (See  Comments)    Hallucinations.  Yvette Rack [Cyclobenzaprine] Other (See Comments)    Prolonged QTc to 571, tachycardia  . Soma [Carisoprodol] Itching and Rash    Objective: Vascular Examination: Capillary refill time <3 seconds  Dorsalis pedis pulses palpable b/l Posterior tibial pulses palable b/l No digital hair x 10 digits Skin temperature WNL b/l  Dermatological Examination: Skin thin, shiny and atrophic b/l  Hemorrhagic hyperkeratotic lesion noted submetatarsal head 5 b/l with tenderness to palpation. No flocculence, no edema, no erythema, no underlying open wound.   Musculoskeletal: Muscle strength 5/5 to all LE muscle groups  Neurological: Sensation intact with 10 gram monofilament.  Xrays right foot  show no change since last visit. No bone erosion.  Assessment: 1. Preulcerative callus submet head 5 right foot 2. NIDDM with diabetic neuropathy  Plan: 1. Discuss diabetic foot care principles. Literature dispensed. 2. Xray right foot. 3. Discussed compliance with diabetic shoes. She refuses to wear them and will only wear her open toed sandals. She feels these are the only shoes that give her comfort. 4. Hyperkeratotic lesion(s)  Pared submet head 5 right foot with sterile blade and gently smoothed with burr. 5. Patient to continue soft, supportive shoe gear 6. Patient to report any pedal injuries to medical professional  7. Follow up 4-6 weeks.  Patient/POA to call should there be a concern in the interim.

## 2018-06-18 ENCOUNTER — Ambulatory Visit (INDEPENDENT_AMBULATORY_CARE_PROVIDER_SITE_OTHER): Payer: Medicare Other | Admitting: Internal Medicine

## 2018-06-18 ENCOUNTER — Other Ambulatory Visit: Payer: Self-pay

## 2018-06-18 ENCOUNTER — Inpatient Hospital Stay (HOSPITAL_COMMUNITY): Payer: Medicare Other

## 2018-06-18 ENCOUNTER — Telehealth: Payer: Self-pay | Admitting: Internal Medicine

## 2018-06-18 ENCOUNTER — Encounter: Payer: Self-pay | Admitting: Internal Medicine

## 2018-06-18 ENCOUNTER — Inpatient Hospital Stay (HOSPITAL_COMMUNITY)
Admission: AD | Admit: 2018-06-18 | Discharge: 2018-06-21 | DRG: 103 | Disposition: A | Discharge status: Home Health | Payer: Medicare Other | Attending: Oncology | Admitting: Oncology

## 2018-06-18 VITALS — BP 147/59 | HR 79 | Temp 98.2°F | Ht 66.0 in | Wt 167.3 lb

## 2018-06-18 DIAGNOSIS — Z794 Long term (current) use of insulin: Secondary | ICD-10-CM

## 2018-06-18 DIAGNOSIS — Z6827 Body mass index (BMI) 27.0-27.9, adult: Secondary | ICD-10-CM | POA: Diagnosis not present

## 2018-06-18 DIAGNOSIS — Z66 Do not resuscitate: Secondary | ICD-10-CM | POA: Diagnosis present

## 2018-06-18 DIAGNOSIS — F0781 Postconcussional syndrome: Secondary | ICD-10-CM

## 2018-06-18 DIAGNOSIS — N182 Chronic kidney disease, stage 2 (mild): Secondary | ICD-10-CM

## 2018-06-18 DIAGNOSIS — S0093XA Contusion of unspecified part of head, initial encounter: Secondary | ICD-10-CM | POA: Diagnosis present

## 2018-06-18 DIAGNOSIS — Z8711 Personal history of peptic ulcer disease: Secondary | ICD-10-CM | POA: Diagnosis not present

## 2018-06-18 DIAGNOSIS — E1122 Type 2 diabetes mellitus with diabetic chronic kidney disease: Secondary | ICD-10-CM | POA: Diagnosis present

## 2018-06-18 DIAGNOSIS — F339 Major depressive disorder, recurrent, unspecified: Secondary | ICD-10-CM

## 2018-06-18 DIAGNOSIS — J449 Chronic obstructive pulmonary disease, unspecified: Secondary | ICD-10-CM | POA: Diagnosis not present

## 2018-06-18 DIAGNOSIS — N183 Chronic kidney disease, stage 3 (moderate): Secondary | ICD-10-CM | POA: Diagnosis not present

## 2018-06-18 DIAGNOSIS — E1142 Type 2 diabetes mellitus with diabetic polyneuropathy: Secondary | ICD-10-CM

## 2018-06-18 DIAGNOSIS — E785 Hyperlipidemia, unspecified: Secondary | ICD-10-CM

## 2018-06-18 DIAGNOSIS — R112 Nausea with vomiting, unspecified: Secondary | ICD-10-CM

## 2018-06-18 DIAGNOSIS — S0993XA Unspecified injury of face, initial encounter: Secondary | ICD-10-CM | POA: Diagnosis present

## 2018-06-18 DIAGNOSIS — S0083XA Contusion of other part of head, initial encounter: Secondary | ICD-10-CM

## 2018-06-18 DIAGNOSIS — F17210 Nicotine dependence, cigarettes, uncomplicated: Secondary | ICD-10-CM | POA: Diagnosis present

## 2018-06-18 DIAGNOSIS — Z90710 Acquired absence of both cervix and uterus: Secondary | ICD-10-CM

## 2018-06-18 DIAGNOSIS — Z89411 Acquired absence of right great toe: Secondary | ICD-10-CM

## 2018-06-18 DIAGNOSIS — Z888 Allergy status to other drugs, medicaments and biological substances status: Secondary | ICD-10-CM

## 2018-06-18 DIAGNOSIS — W500XXA Accidental hit or strike by another person, initial encounter: Secondary | ICD-10-CM | POA: Diagnosis present

## 2018-06-18 DIAGNOSIS — G44311 Acute post-traumatic headache, intractable: Secondary | ICD-10-CM

## 2018-06-18 DIAGNOSIS — F329 Major depressive disorder, single episode, unspecified: Secondary | ICD-10-CM | POA: Diagnosis present

## 2018-06-18 DIAGNOSIS — Z7984 Long term (current) use of oral hypoglycemic drugs: Secondary | ICD-10-CM | POA: Diagnosis not present

## 2018-06-18 DIAGNOSIS — D509 Iron deficiency anemia, unspecified: Secondary | ICD-10-CM

## 2018-06-18 DIAGNOSIS — A084 Viral intestinal infection, unspecified: Secondary | ICD-10-CM | POA: Diagnosis present

## 2018-06-18 DIAGNOSIS — S0990XA Unspecified injury of head, initial encounter: Secondary | ICD-10-CM | POA: Insufficient documentation

## 2018-06-18 DIAGNOSIS — Z89431 Acquired absence of right foot: Secondary | ICD-10-CM

## 2018-06-18 DIAGNOSIS — Z79899 Other long term (current) drug therapy: Secondary | ICD-10-CM | POA: Diagnosis not present

## 2018-06-18 DIAGNOSIS — J45909 Unspecified asthma, uncomplicated: Secondary | ICD-10-CM | POA: Diagnosis not present

## 2018-06-18 DIAGNOSIS — I129 Hypertensive chronic kidney disease with stage 1 through stage 4 chronic kidney disease, or unspecified chronic kidney disease: Secondary | ICD-10-CM

## 2018-06-18 DIAGNOSIS — E119 Type 2 diabetes mellitus without complications: Secondary | ICD-10-CM

## 2018-06-18 DIAGNOSIS — S0033XA Contusion of nose, initial encounter: Secondary | ICD-10-CM | POA: Diagnosis present

## 2018-06-18 DIAGNOSIS — R296 Repeated falls: Secondary | ICD-10-CM | POA: Diagnosis present

## 2018-06-18 DIAGNOSIS — Z884 Allergy status to anesthetic agent status: Secondary | ICD-10-CM

## 2018-06-18 DIAGNOSIS — Z801 Family history of malignant neoplasm of trachea, bronchus and lung: Secondary | ICD-10-CM

## 2018-06-18 DIAGNOSIS — Z8673 Personal history of transient ischemic attack (TIA), and cerebral infarction without residual deficits: Secondary | ICD-10-CM | POA: Diagnosis not present

## 2018-06-18 DIAGNOSIS — Z720 Tobacco use: Secondary | ICD-10-CM

## 2018-06-18 DIAGNOSIS — E669 Obesity, unspecified: Secondary | ICD-10-CM | POA: Diagnosis present

## 2018-06-18 DIAGNOSIS — H509 Unspecified strabismus: Secondary | ICD-10-CM | POA: Diagnosis present

## 2018-06-18 DIAGNOSIS — Z96652 Presence of left artificial knee joint: Secondary | ICD-10-CM | POA: Diagnosis present

## 2018-06-18 DIAGNOSIS — Z9079 Acquired absence of other genital organ(s): Secondary | ICD-10-CM

## 2018-06-18 DIAGNOSIS — Z8249 Family history of ischemic heart disease and other diseases of the circulatory system: Secondary | ICD-10-CM

## 2018-06-18 DIAGNOSIS — K279 Peptic ulcer, site unspecified, unspecified as acute or chronic, without hemorrhage or perforation: Secondary | ICD-10-CM | POA: Diagnosis present

## 2018-06-18 DIAGNOSIS — R51 Headache: Secondary | ICD-10-CM | POA: Diagnosis not present

## 2018-06-18 DIAGNOSIS — W51XXXA Accidental striking against or bumped into by another person, initial encounter: Secondary | ICD-10-CM

## 2018-06-18 DIAGNOSIS — G44301 Post-traumatic headache, unspecified, intractable: Secondary | ICD-10-CM | POA: Diagnosis not present

## 2018-06-18 HISTORY — DX: Unspecified injury of face, initial encounter: S09.93XA

## 2018-06-18 LAB — CBC
HEMATOCRIT: 46 % (ref 36.0–46.0)
Hemoglobin: 14.2 g/dL (ref 12.0–15.0)
MCH: 30.5 pg (ref 26.0–34.0)
MCHC: 30.9 g/dL (ref 30.0–36.0)
MCV: 98.7 fL (ref 80.0–100.0)
Platelets: 119 10*3/uL — ABNORMAL LOW (ref 150–400)
RBC: 4.66 MIL/uL (ref 3.87–5.11)
RDW: 14.6 % (ref 11.5–15.5)
WBC: 5.9 10*3/uL (ref 4.0–10.5)
nRBC: 0 % (ref 0.0–0.2)

## 2018-06-18 LAB — BASIC METABOLIC PANEL
Anion gap: 13 (ref 5–15)
BUN: 20 mg/dL (ref 8–23)
CO2: 16 mmol/L — ABNORMAL LOW (ref 22–32)
Calcium: 9.3 mg/dL (ref 8.9–10.3)
Chloride: 106 mmol/L (ref 98–111)
Creatinine, Ser: 1 mg/dL (ref 0.44–1.00)
GFR calc Af Amer: >60 mL/min (ref >60)
GFR calc non Af Amer: 59 mL/min — ABNORMAL LOW (ref >60)
Glucose, Bld: 185 mg/dL — ABNORMAL HIGH (ref 70–99)
Potassium: 4.5 mmol/L (ref 3.5–5.1)
Sodium: 135 mmol/L (ref 135–145)

## 2018-06-18 LAB — GLUCOSE, CAPILLARY: Glucose-Capillary: 136 mg/dL — ABNORMAL HIGH (ref 70–99)

## 2018-06-18 LAB — PROTIME-INR
INR: 1.02
Prothrombin Time: 13.3 seconds (ref 11.4–15.2)

## 2018-06-18 LAB — APTT: aPTT: 28 seconds (ref 24–36)

## 2018-06-18 MED ORDER — SODIUM CHLORIDE (PF) 0.9 % IJ SOLN: 1000.0000 mL | Freq: Once | INTRAMUSCULAR | Status: DC

## 2018-06-18 MED ORDER — SODIUM CHLORIDE 0.9 % IV SOLN
Freq: Once | INTRAVENOUS | Status: AC
  Administered 2018-06-18: 17:00:00 via INTRAVENOUS

## 2018-06-18 MED ORDER — INSULIN ASPART 100 UNIT/ML ~~LOC~~ SOLN: 0.0000 [IU] | Freq: Every day | SUBCUTANEOUS | Status: DC

## 2018-06-18 MED ORDER — ROSUVASTATIN CALCIUM 20 MG PO TABS
40.0000 mg | ORAL_TABLET | Freq: Every day | ORAL | Status: DC
  Administered 2018-06-18 – 2018-06-21 (×4): 40 mg via ORAL
  Filled 2018-06-18 (×4): qty 2

## 2018-06-18 MED ORDER — SODIUM CHLORIDE 0.9 % IV SOLN
INTRAVENOUS | Status: DC
  Administered 2018-06-18: 21:00:00 via INTRAVENOUS

## 2018-06-18 MED ORDER — SERTRALINE HCL 100 MG PO TABS
100.0000 mg | ORAL_TABLET | Freq: Every day | ORAL | Status: DC
  Administered 2018-06-18 – 2018-06-21 (×4): 100 mg via ORAL
  Filled 2018-06-18 (×4): qty 1

## 2018-06-18 MED ORDER — HYDROMORPHONE HCL 2 MG PO TABS
1.0000 mg | ORAL_TABLET | Freq: Once | ORAL | Status: AC
  Administered 2018-06-18: 1 mg via ORAL
  Filled 2018-06-18: qty 1

## 2018-06-18 MED ORDER — ONDANSETRON HCL 4 MG/2ML IJ SOLN
4.0000 mg | Freq: Four times a day (QID) | INTRAMUSCULAR | Status: DC | PRN
  Administered 2018-06-18 – 2018-06-19 (×2): 4 mg via INTRAVENOUS
  Filled 2018-06-18 (×2): qty 2

## 2018-06-18 MED ORDER — ONDANSETRON HCL 4 MG PO TABS: 4.0000 mg | ORAL_TABLET | Freq: Four times a day (QID) | ORAL | Status: DC | PRN

## 2018-06-18 MED ORDER — INSULIN ASPART 100 UNIT/ML ~~LOC~~ SOLN
0.0000 [IU] | Freq: Three times a day (TID) | SUBCUTANEOUS | Status: DC
  Administered 2018-06-19 – 2018-06-21 (×3): 2 [IU] via SUBCUTANEOUS

## 2018-06-18 MED ORDER — PANTOPRAZOLE SODIUM 40 MG PO TBEC
40.0000 mg | DELAYED_RELEASE_TABLET | Freq: Two times a day (BID) | ORAL | Status: DC
  Administered 2018-06-18 – 2018-06-21 (×6): 40 mg via ORAL
  Filled 2018-06-18 (×6): qty 1

## 2018-06-18 MED ORDER — HYDROMORPHONE HCL 1 MG/ML IJ SOLN
0.5000 mg | INTRAMUSCULAR | Status: DC | PRN
  Administered 2018-06-18 – 2018-06-20 (×7): 0.5 mg via INTRAVENOUS
  Filled 2018-06-18 (×7): qty 1

## 2018-06-18 MED ORDER — PROMETHAZINE HCL 25 MG/ML IJ SOLN
12.5000 mg | Freq: Once | INTRAMUSCULAR | Status: AC
  Administered 2018-06-18: 12.5 mg via INTRAMUSCULAR

## 2018-06-18 MED ORDER — GABAPENTIN 300 MG PO CAPS
300.0000 mg | ORAL_CAPSULE | Freq: Three times a day (TID) | ORAL | Status: DC
  Administered 2018-06-18 – 2018-06-21 (×8): 300 mg via ORAL
  Filled 2018-06-18 (×8): qty 1

## 2018-06-18 NOTE — Telephone Encounter (Signed)
Called pt - stated she has been having a h/a x 1 week;unable to get rid of. Then around 2 AM this morning, she started vomiting and even felled on the way to the bathroom. Stated she has not stop vomiting. BP 161/109. First available appt scheduled for 1315 PM in [place].

## 2018-06-18 NOTE — Assessment & Plan Note (Signed)
Patient with intractable n/v with associated myalgias, fatigue, weakness and clinical dehydration on exam. She has had multiple sick contacts at home.   Plan: --admit to IMTS --NS bolus --IM phenergan 12.5mg  once --stat Bmet and CBC

## 2018-06-18 NOTE — Telephone Encounter (Signed)
Agree with appointment. Thank you!  

## 2018-06-18 NOTE — H&P (Signed)
Report called to [facility] on [address] .  Patient transported via wheelchair to [address] [facility].  IV in right arm at 500 cc/hr intact.  Regino Schultze Venus Gilles,RN 06/18/2018 6:06 PM.
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
## 2018-06-18 NOTE — Assessment & Plan Note (Signed)
Patient with frontal head trauma with facial ecchymoses. She has now developed intractable nausea and vomiting, however difficult to say if from trauma or viral illness as multiple family members have also been ill.  She has had intractable headache since incident and has attempted tx with asa and excedrin putting her at risk for intracranial bleed.  She has no acute neurological changes on exam.  Plan: --admit to IMTS --stat CT head w/o contrast and CT maxillofacial w/o contrast

## 2018-06-18 NOTE — Telephone Encounter (Signed)
Pt has been threwing up at least 8 times, pt also fall. pt contact [phone]

## 2018-06-19 ENCOUNTER — Encounter (HOSPITAL_COMMUNITY): Payer: Self-pay | Admitting: General Practice

## 2018-06-19 DIAGNOSIS — E119 Type 2 diabetes mellitus without complications: Secondary | ICD-10-CM

## 2018-06-19 DIAGNOSIS — Z8711 Personal history of peptic ulcer disease: Secondary | ICD-10-CM

## 2018-06-19 DIAGNOSIS — Z7984 Long term (current) use of oral hypoglycemic drugs: Secondary | ICD-10-CM

## 2018-06-19 DIAGNOSIS — G44301 Post-traumatic headache, unspecified, intractable: Secondary | ICD-10-CM

## 2018-06-19 DIAGNOSIS — S0083XA Contusion of other part of head, initial encounter: Secondary | ICD-10-CM

## 2018-06-19 DIAGNOSIS — I129 Hypertensive chronic kidney disease with stage 1 through stage 4 chronic kidney disease, or unspecified chronic kidney disease: Secondary | ICD-10-CM

## 2018-06-19 DIAGNOSIS — W51XXXA Accidental striking against or bumped into by another person, initial encounter: Secondary | ICD-10-CM

## 2018-06-19 DIAGNOSIS — J45909 Unspecified asthma, uncomplicated: Secondary | ICD-10-CM

## 2018-06-19 DIAGNOSIS — R112 Nausea with vomiting, unspecified: Secondary | ICD-10-CM

## 2018-06-19 DIAGNOSIS — N183 Chronic kidney disease, stage 3 (moderate): Secondary | ICD-10-CM

## 2018-06-19 DIAGNOSIS — E1122 Type 2 diabetes mellitus with diabetic chronic kidney disease: Secondary | ICD-10-CM

## 2018-06-19 LAB — BASIC METABOLIC PANEL
Anion gap: 10 (ref 5–15)
BUN: 18 mg/dL (ref 8–23)
CO2: 21 mmol/L — ABNORMAL LOW (ref 22–32)
Calcium: 8.8 mg/dL — ABNORMAL LOW (ref 8.9–10.3)
Chloride: 106 mmol/L (ref 98–111)
Creatinine, Ser: 0.94 mg/dL (ref 0.44–1.00)
GFR calc Af Amer: >60 mL/min (ref >60)
GFR calc non Af Amer: >60 mL/min (ref >60)
Glucose, Bld: 121 mg/dL — ABNORMAL HIGH (ref 70–99)
Potassium: 4.1 mmol/L (ref 3.5–5.1)
Sodium: 137 mmol/L (ref 135–145)

## 2018-06-19 LAB — GLUCOSE, CAPILLARY
Glucose-Capillary: 86 mg/dL (ref 70–99)
Glucose-Capillary: 101 mg/dL — ABNORMAL HIGH (ref 70–99)
Glucose-Capillary: 187 mg/dL — ABNORMAL HIGH (ref 70–99)
Glucose-Capillary: 112 mg/dL — ABNORMAL HIGH (ref 70–99)

## 2018-06-19 NOTE — Progress Notes (Signed)
Medicine attending: I examined this patient today together with resident physician Dr Vilma Prader and I concur with her evaluation and management plan. CT scan shows no signs of subdural hematoma or facial bone fracture.  Patient still suffering from pain and headache related to significant soft tissue contusions. We are using low-dose parenteral Dilaudid for pain control.  In view of recent ulcer disease, she is not a candidate for steroid or nonsteroidal anti-inflammatory drugs. Continue to monitor glucose while she is not taking much p.o. Patient clarified she is only taking 2 not 3 hypoglycemic medications at home: Glucophage and Januvia but not glipizide. She does not use home oxygen. Discharge when she is able to take p.o.

## 2018-06-19 NOTE — Progress Notes (Signed)
Internal Medicine Clinic Attending  Case discussed with Dr. Svalina  at the time of the visit.  We reviewed the resident's history and exam and pertinent patient test results.  I agree with the assessment, diagnosis, and plan of care documented in the resident's note.  
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
## 2018-06-20 DIAGNOSIS — F0781 Postconcussional syndrome: Secondary | ICD-10-CM

## 2018-06-20 LAB — GLUCOSE, CAPILLARY
GLUCOSE-CAPILLARY: 119 mg/dL — AB (ref 70–99)
Glucose-Capillary: 191 mg/dL — ABNORMAL HIGH (ref 70–99)
Glucose-Capillary: 104 mg/dL — ABNORMAL HIGH (ref 70–99)
Glucose-Capillary: 156 mg/dL — ABNORMAL HIGH (ref 70–99)

## 2018-06-20 MED ORDER — HYDROCODONE-ACETAMINOPHEN 5-325 MG PO TABS: 1.0000 | ORAL_TABLET | Freq: Four times a day (QID) | ORAL | Status: DC | PRN

## 2018-06-20 MED ORDER — KETOROLAC TROMETHAMINE 30 MG/ML IJ SOLN
30.0000 mg | Freq: Once | INTRAMUSCULAR | Status: AC
  Administered 2018-06-20: 30 mg via INTRAVENOUS
  Filled 2018-06-20: qty 1

## 2018-06-20 MED ORDER — HYDROCODONE-ACETAMINOPHEN 5-325 MG PO TABS
1.0000 | ORAL_TABLET | Freq: Four times a day (QID) | ORAL | Status: DC | PRN
  Administered 2018-06-20 – 2018-06-21 (×2): 1 via ORAL
  Filled 2018-06-20 (×2): qty 1

## 2018-06-20 MED ORDER — HYDROMORPHONE HCL 1 MG/ML IJ SOLN: 0.5000 mg | Freq: Three times a day (TID) | INTRAMUSCULAR | Status: DC | PRN

## 2018-06-20 MED ORDER — HYDROMORPHONE HCL 1 MG/ML IJ SOLN
0.5000 mg | Freq: Three times a day (TID) | INTRAMUSCULAR | Status: DC | PRN
  Administered 2018-06-20 – 2018-06-21 (×2): 0.5 mg via INTRAVENOUS
  Filled 2018-06-20 (×2): qty 1

## 2018-06-20 NOTE — Progress Notes (Signed)
Medicine attending: I examined this patient today together with resident physicians Dr. Ina Homes and Vilma Prader and I concur with her evaluation and management plan.  Persistent significant headache and nausea.  No further vomiting.  Still taking minimal p.o.  She ate 2 meals yesterday but threw up after supper. She remains alert and oriented without any focal neurologic deficits.  Imaging this admission negative for subdural hematoma or fracture.  Small doses of Dilaudid not controlling her headache only lasting for about an hour.  Despite her recent diagnosis of peptic ulcer disease, we would like to go ahead and give her a trial of parenteral Toradol and if this is successful cautiously prescribe a nonsteroidal such as Celebrex which may have less GI side effects than some of the other nonsteroidals.  Reevaluate the patient later today.  Discharge if otherwise stable and responding to nonsteroidals.
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
## 2018-06-21 ENCOUNTER — Telehealth: Payer: Self-pay

## 2018-06-21 LAB — GLUCOSE, CAPILLARY
Glucose-Capillary: 118 mg/dL — ABNORMAL HIGH (ref 70–99)
Glucose-Capillary: 169 mg/dL — ABNORMAL HIGH (ref 70–99)

## 2018-06-21 MED ORDER — ONDANSETRON HCL 4 MG PO TABS: 4.0000 mg | ORAL_TABLET | Freq: Four times a day (QID) | ORAL | 0 refills | Status: DC | PRN

## 2018-06-21 MED ORDER — HYDROCODONE-ACETAMINOPHEN 5-325 MG PO TABS: 1.0000 | ORAL_TABLET | Freq: Four times a day (QID) | ORAL | 0 refills | Status: DC | PRN

## 2018-06-21 NOTE — Care Management Important Message (Signed)
Important Message  Patient Details  Name: [name] MRN: [id number] Date of Birth: [birth year]   Medicare Important Message Given:  Yes    Esker Dever Montine Circle 06/21/2018, 2:44 PM
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
## 2018-06-21 NOTE — Care Management Note (Signed)
Case Management Note  Patient Details  Name: Daziah Hesler MRN: [id number] Date of Birth: September 15, 1950  Subjective/Objective:    Pt admitted post fall               Action/Plan:   PTA Independent from home with family - members of her household will provide recommended 24/7 supervision at home.  Pt will transport home via private vehicle driven by her daughter.  Pt interested in [facility] - CM provided medicare.gov HH list - [place] chosen - agency contacted and referral accepted.  No other CM needs determined at time of discharge - CM signing off   Expected Discharge Date:  06/21/18               Expected Discharge Plan:  [place]  In-House Referral:     Discharge planning Services  CM Consult  Post Acute Care Choice:    Choice offered to:  Patient  DME Arranged:    DME Agency:     HH Arranged:  PT, OT HH Agency:  [place]  Status of Service:  In process, will continue to follow  If discussed at Long Length of Stay Meetings, dates discussed:    Additional Comments:  Maryclare Labrador, RN 06/21/2018, 10:35 AM

## 2018-06-21 NOTE — Telephone Encounter (Signed)
Hospital TOC per Dr Annie Paras, dishcarge 06/21/18, appt 06/26/18.
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
## 2018-06-21 NOTE — Progress Notes (Addendum)
Discussed with the patient and all questioned fully answered. She will call me if any problems arise.  IV removed. No telemetry. Pt provided with discharge AVS. Verbalized understanding of new medications. Prescriptions were sent to pt's preferred pharmacy.   Pt transported to discharge lounge in wheelchair. Agreeable to discharge lounge. States she will call her family and let her know. All belongings sent with patient.   Pt sent to discharge loung in 2W39. Spoke with pt's daughter [name] to update her as to the pt's location.   Fritz Pickerel, RN

## 2018-06-25 ENCOUNTER — Telehealth: Payer: Self-pay | Admitting: Internal Medicine

## 2018-06-25 NOTE — Telephone Encounter (Signed)
Transition Care Management Follow-up Telephone Call   Date discharged? 06/21/18   How have you been since you were released from the hospital? Stated doing good except for an occasional headache.   Do you understand why you were in the hospital? Yes; stated headaches and had a fall.   Do you understand the discharge instructions? yes   Where were you discharged to? Home.   Items Reviewed:  Medications reviewed: yes  Allergies reviewed: yes  Dietary changes reviewed: yes; stated no changes in her diet.  Referrals reviewed: yes; stated PT.   Functional Questionnaire:   Activities of Daily Living (ADLs):   She states she is independent of ADL's; doing everything herself.   Any transportation issues/concerns?: no   Any patient concerns? no   Confirmed importance and date/time of follow-up visits scheduled yes. Stated she's aware of her appointment tomorrow in [facility] at 0945 AM and she will be here.  Confirmed with patient if condition begins to worsen call PCP or go to the ER.  Patient was given the office number and encouraged to call back with question or concerns.  : yes

## 2018-06-25 NOTE — Telephone Encounter (Signed)
I would like for her to continue with PT due to her frequent falls I feel like she needs supervised PT. Thanks!

## 2018-06-25 NOTE — Telephone Encounter (Signed)
Per [name] refused services provide by [facility], far as her PT. Patient is not wearing eye glasses like she should, she felt a need not to come in.   Needs a verbal order to discharge

## 2018-06-25 NOTE — Telephone Encounter (Signed)
Talked to Schneider, [place] at [facility] - stated he visited the pt and reviewed doing balance exercise (hx of falls) and eyes exercises (hx of VOR)- pt told him she can do these exercises on her own and no need for him to return. So, if appropriate, he needs a verbal order to discharge pt.

## 2018-06-26 ENCOUNTER — Ambulatory Visit (INDEPENDENT_AMBULATORY_CARE_PROVIDER_SITE_OTHER): Payer: Medicare Other | Admitting: Internal Medicine

## 2018-06-26 ENCOUNTER — Other Ambulatory Visit: Payer: Self-pay

## 2018-06-26 VITALS — BP 163/72 | HR 75 | Temp 98.0°F | Ht 66.0 in | Wt 170.8 lb

## 2018-06-26 DIAGNOSIS — F0781 Postconcussional syndrome: Secondary | ICD-10-CM | POA: Diagnosis not present

## 2018-06-26 DIAGNOSIS — H532 Diplopia: Secondary | ICD-10-CM

## 2018-06-26 DIAGNOSIS — G44309 Post-traumatic headache, unspecified, not intractable: Secondary | ICD-10-CM | POA: Diagnosis not present

## 2018-06-26 MED ORDER — HYDROCODONE-ACETAMINOPHEN 5-325 MG PO TABS: 1.0000 | ORAL_TABLET | Freq: Every evening | ORAL | 0 refills | Status: DC | PRN

## 2018-06-26 NOTE — Telephone Encounter (Signed)
Pt was called and informed Dr Maricela Bo wants to continue PT - pt agreed. Donia Guiles ,PT from [place], informed to continue PT per Dr Maricela Bo. Verbal order given "PT Twice a week x 4 weeks; then once a week x 1 week- for balance and eye exercises for coordination". Stated he will call their office to get the paperwork re-started.

## 2018-06-26 NOTE — Patient Instructions (Addendum)
Thank you for allowing [place] to provide your care today.  You were seen for recent hospital visit follow-up.  I am glad that you feel better, did not have any nausea or vomiting. Your headache has improved but is still bothering you mostly at night. Your exam is reassuring, I prescribed few more Norco-Vicodin for you to take 1 in the evening for pain as needed. (please follow the instruction) Today we did not make any changes to your medications.    Please follow-up with your primary care as a scheduled on February or come to clinic sooner as needed.  Should you have any questions or concerns please call the internal medicine clinic at [phone].   Thank you

## 2018-06-26 NOTE — Progress Notes (Signed)
   CC: Hospital visit follow-up HPI:  [name] is a 68 y.o. female with past medical history listed below, came to the clinic today for follow-up of recent hospitalization.  Please see problem based charting for further details and assessment and plan.  Past Medical History:  Diagnosis Date  . Allergy   . Anemia   . Arthritis    "left knee" (09/08/2015)  . Asthma   . Blood transfusion without reported diagnosis 10/2017   anemic  . Brachial plexus disorders   . CHF (congestive heart failure) ([place])   . Chronic pain    neck pain, headache, neuropathy  . COPD (chronic obstructive pulmonary disease) ([place])    SEES ONLY DR. PATEL   . Depression    "when my husband passed in 2013"  . Facial trauma 05/2018  . Gastric ulcer   . GERD (gastroesophageal reflux disease)   . Hypertension   . Hypertriglyceridemia   . Migraine    "monthly" (09/08/2015)  . Neuromuscular disorder (HCC)    neuropathy  . Puncture wound of foot, right 05/17/2012   Tetanus shot 3 yrs ago at [facility] in [place], per pt report   . Stroke ([place])    TIAS    IN [place]   4 YRS AGO   . Type II diabetes mellitus ([place]) 2010   diagnosed around 2010, only ever on metformin   Review of Systems:    Review of Systems  Constitutional: Negative for chills and fever.  Eyes: Positive for double vision. (Chronic)  Gastrointestinal: Negative for nausea and vomiting.  Neurological: Positive for headaches. Negative for dizziness.   Physical Exam:  Vitals:   06/26/18 0955  BP: (!) 163/72  Pulse: 75  Temp: 98 F (36.7 C)  TempSrc: Oral  SpO2: 93%  Weight: 170 lb 12.8 oz (77.5 kg)  Height: 5\' 6"  (1.676 m)   Physical Exam Eyes:     Extraocular Movements: Extraocular movements intact.  Bruised on the frontal head and under her eyes (improved).  Cardiovascular:     Rate and Rhythm: Normal rate and regular rhythm.     Pulses: Normal pulses.     Heart sounds: Normal heart sounds. No murmur.  Pulmonary:    Effort: Pulmonary effort is normal.  Abdominal:     General: Bowel sounds are normal.     Palpations: Abdomen is soft.     Tenderness: There is no abdominal tenderness.  Musculoskeletal:        General: No swelling or tenderness.     Right lower leg: No edema.     Left lower leg: No edema.  Neurological:     Mental Status: She is alert and oriented to person, place, and time. Mental status is at baseline.  Psychiatric:        Mood and Affect: Mood normal.        Behavior: Behavior normal.    Assessment & Plan:   See Encounters Tab for problem based charting.  Patient discussed with Dr. Rebeca Alert

## 2018-06-26 NOTE — Telephone Encounter (Signed)
Perfect, thanks 

## 2018-06-26 NOTE — Telephone Encounter (Signed)
Pt is being seen in [facility] by Dr Myrtie Hawk - talked to Ander Purpura, the nurse about continuing PT.

## 2018-06-28 ENCOUNTER — Other Ambulatory Visit: Payer: Self-pay

## 2018-06-28 DIAGNOSIS — Z9181 History of falling: Secondary | ICD-10-CM | POA: Diagnosis not present

## 2018-06-28 DIAGNOSIS — M1712 Unilateral primary osteoarthritis, left knee: Secondary | ICD-10-CM | POA: Diagnosis not present

## 2018-06-28 DIAGNOSIS — R269 Unspecified abnormalities of gait and mobility: Secondary | ICD-10-CM | POA: Diagnosis not present

## 2018-06-28 DIAGNOSIS — Z7984 Long term (current) use of oral hypoglycemic drugs: Secondary | ICD-10-CM | POA: Diagnosis not present

## 2018-06-28 DIAGNOSIS — D500 Iron deficiency anemia secondary to blood loss (chronic): Secondary | ICD-10-CM | POA: Diagnosis not present

## 2018-06-28 DIAGNOSIS — Z8673 Personal history of transient ischemic attack (TIA), and cerebral infarction without residual deficits: Secondary | ICD-10-CM | POA: Diagnosis not present

## 2018-06-28 DIAGNOSIS — S0990XD Unspecified injury of head, subsequent encounter: Secondary | ICD-10-CM | POA: Diagnosis not present

## 2018-06-28 DIAGNOSIS — E1142 Type 2 diabetes mellitus with diabetic polyneuropathy: Secondary | ICD-10-CM | POA: Diagnosis not present

## 2018-06-28 DIAGNOSIS — M25512 Pain in left shoulder: Secondary | ICD-10-CM | POA: Diagnosis not present

## 2018-06-28 DIAGNOSIS — W19XXXD Unspecified fall, subsequent encounter: Secondary | ICD-10-CM | POA: Diagnosis not present

## 2018-06-28 DIAGNOSIS — F17200 Nicotine dependence, unspecified, uncomplicated: Secondary | ICD-10-CM | POA: Diagnosis not present

## 2018-06-28 NOTE — Telephone Encounter (Signed)
Pt states   HYDROcodone-acetaminophen (NORCO/VICODIN) 5-325 MG tablet   Is not at the pharmacy, pt is using [facility] on [address].

## 2018-06-29 ENCOUNTER — Other Ambulatory Visit: Payer: Self-pay | Admitting: Internal Medicine

## 2018-06-29 DIAGNOSIS — F0781 Postconcussional syndrome: Secondary | ICD-10-CM

## 2018-06-29 MED ORDER — HYDROCODONE-ACETAMINOPHEN 5-325 MG PO TABS: 1.0000 | ORAL_TABLET | Freq: Every evening | ORAL | 0 refills | Status: DC | PRN

## 2018-06-29 NOTE — Progress Notes (Signed)
Internal Medicine Clinic Attending  Case discussed with Dr. Masoudi at the time of the visit.  We reviewed the resident's history and exam and pertinent patient test results.  I agree with the assessment, diagnosis, and plan of care documented in the resident's note.  Alexander Raines, M.D., Ph.D.  

## 2018-06-29 NOTE — Assessment & Plan Note (Signed)
[name], came to the clinic for follow-up after recent hospitalization due to head trauma.  She reports that she generally feels better, still has some headache with intensity of 6-7/10.  She takes 1 or 2 Norco with partial relief she has tried to avoid Norco during the day because it makes her sleepy.  She denies any nausea vomiting, new change in her vision, any new numbness or weakness. She still has some bruise on her frontal head and under her eyes which are improving. Neuro physical exam is at baseline.  Assessment and plan: She shows some improvement regarding pain, does not have any new neurological deficit. We will continue pain control for more few days. -Oxycodone-Norco 5-325, 1 at bed time. (Has 2 left from previous prescription) -F/u with PCP as scheduled for Feb, or return to clinic sooner as needed

## 2018-07-03 DIAGNOSIS — S0990XD Unspecified injury of head, subsequent encounter: Secondary | ICD-10-CM | POA: Diagnosis not present

## 2018-07-03 DIAGNOSIS — M1712 Unilateral primary osteoarthritis, left knee: Secondary | ICD-10-CM | POA: Diagnosis not present

## 2018-07-03 DIAGNOSIS — D500 Iron deficiency anemia secondary to blood loss (chronic): Secondary | ICD-10-CM | POA: Diagnosis not present

## 2018-07-03 DIAGNOSIS — E1142 Type 2 diabetes mellitus with diabetic polyneuropathy: Secondary | ICD-10-CM | POA: Diagnosis not present

## 2018-07-03 DIAGNOSIS — R269 Unspecified abnormalities of gait and mobility: Secondary | ICD-10-CM | POA: Diagnosis not present

## 2018-07-03 DIAGNOSIS — F17200 Nicotine dependence, unspecified, uncomplicated: Secondary | ICD-10-CM | POA: Diagnosis not present

## 2018-07-04 ENCOUNTER — Telehealth: Payer: Self-pay

## 2018-07-04 NOTE — Telephone Encounter (Signed)
Danny from [place] called, states pt was sick with N/V today PT was cancelled.  PT ordered 2X/week, but will only be able to do 1X this week r/t illness and daughter's request.  This nurse informed [name] that was fine.  Will forward to pcp. Thank you, SChaplin, RN,BSN

## 2018-07-04 NOTE — Telephone Encounter (Signed)
That would be alright, thanks

## 2018-07-12 ENCOUNTER — Telehealth: Payer: Self-pay | Admitting: *Deleted

## 2018-07-12 NOTE — Telephone Encounter (Signed)
Call from [facility] - stated he has seen pt 2 times and suppose to see her today but pt is not answering her telephone nor her family. He asked if she was in the hospital - told him no. He's scheduled to see her today and tomorrow. Stated if he does know receive a call from pt , he will decrease her visits to once a week.  I called the pt - she answered the phone - informed [name] has been trying to get in contact with her - she stated she saw it on her caller id and she has been in court for last  2 days and usually does not feel like doing PT afterwards. Asked her to call [name] re schedule her appt - stated she will.  Called Danny PT - informed of what pt had stated. Stated he will f/u with the pt to see if she wants to continue PT.

## 2018-07-13 NOTE — Telephone Encounter (Signed)
[name], PT with [facility] called in. States patient did call him back and would like to resume PT next week. He will cancel the 2 visits that she missed this week and restart original order next week. Hubbard Hartshorn, RN, BSN

## 2018-07-15 NOTE — Telephone Encounter (Signed)
Thank you :)

## 2018-07-16 DIAGNOSIS — E1142 Type 2 diabetes mellitus with diabetic polyneuropathy: Secondary | ICD-10-CM | POA: Diagnosis not present

## 2018-07-16 DIAGNOSIS — M1712 Unilateral primary osteoarthritis, left knee: Secondary | ICD-10-CM | POA: Diagnosis not present

## 2018-07-16 DIAGNOSIS — F17200 Nicotine dependence, unspecified, uncomplicated: Secondary | ICD-10-CM | POA: Diagnosis not present

## 2018-07-16 DIAGNOSIS — S0990XD Unspecified injury of head, subsequent encounter: Secondary | ICD-10-CM | POA: Diagnosis not present

## 2018-07-16 DIAGNOSIS — R269 Unspecified abnormalities of gait and mobility: Secondary | ICD-10-CM | POA: Diagnosis not present

## 2018-07-16 DIAGNOSIS — D500 Iron deficiency anemia secondary to blood loss (chronic): Secondary | ICD-10-CM | POA: Diagnosis not present

## 2018-07-18 DIAGNOSIS — F17200 Nicotine dependence, unspecified, uncomplicated: Secondary | ICD-10-CM | POA: Diagnosis not present

## 2018-07-18 DIAGNOSIS — E1142 Type 2 diabetes mellitus with diabetic polyneuropathy: Secondary | ICD-10-CM | POA: Diagnosis not present

## 2018-07-18 DIAGNOSIS — M1712 Unilateral primary osteoarthritis, left knee: Secondary | ICD-10-CM | POA: Diagnosis not present

## 2018-07-18 DIAGNOSIS — S0990XD Unspecified injury of head, subsequent encounter: Secondary | ICD-10-CM | POA: Diagnosis not present

## 2018-07-18 DIAGNOSIS — D500 Iron deficiency anemia secondary to blood loss (chronic): Secondary | ICD-10-CM | POA: Diagnosis not present

## 2018-07-18 DIAGNOSIS — R269 Unspecified abnormalities of gait and mobility: Secondary | ICD-10-CM | POA: Diagnosis not present

## 2018-07-20 ENCOUNTER — Other Ambulatory Visit: Payer: Self-pay

## 2018-07-20 ENCOUNTER — Encounter: Payer: Self-pay | Admitting: Internal Medicine

## 2018-07-20 ENCOUNTER — Ambulatory Visit (INDEPENDENT_AMBULATORY_CARE_PROVIDER_SITE_OTHER): Payer: Medicare Other | Admitting: Internal Medicine

## 2018-07-20 VITALS — BP 127/80 | HR 82 | Temp 97.5°F | Ht 66.0 in | Wt 167.7 lb

## 2018-07-20 DIAGNOSIS — Z7984 Long term (current) use of oral hypoglycemic drugs: Secondary | ICD-10-CM

## 2018-07-20 DIAGNOSIS — E1142 Type 2 diabetes mellitus with diabetic polyneuropathy: Secondary | ICD-10-CM | POA: Diagnosis not present

## 2018-07-20 DIAGNOSIS — E2839 Other primary ovarian failure: Secondary | ICD-10-CM

## 2018-07-20 DIAGNOSIS — R55 Syncope and collapse: Secondary | ICD-10-CM

## 2018-07-20 DIAGNOSIS — R1110 Vomiting, unspecified: Secondary | ICD-10-CM

## 2018-07-20 DIAGNOSIS — Z0000 Encounter for general adult medical examination without abnormal findings: Secondary | ICD-10-CM | POA: Diagnosis not present

## 2018-07-20 DIAGNOSIS — I10 Essential (primary) hypertension: Secondary | ICD-10-CM

## 2018-07-20 DIAGNOSIS — E119 Type 2 diabetes mellitus without complications: Secondary | ICD-10-CM

## 2018-07-20 DIAGNOSIS — E118 Type 2 diabetes mellitus with unspecified complications: Secondary | ICD-10-CM | POA: Diagnosis not present

## 2018-07-20 DIAGNOSIS — D500 Iron deficiency anemia secondary to blood loss (chronic): Secondary | ICD-10-CM

## 2018-07-20 LAB — POCT GLYCOSYLATED HEMOGLOBIN (HGB A1C): Hemoglobin A1C: 7.3 % — AB (ref 4.0–5.6)

## 2018-07-20 LAB — GLUCOSE, CAPILLARY: Glucose-Capillary: 210 mg/dL — ABNORMAL HIGH (ref 70–99)

## 2018-07-20 MED ORDER — GABAPENTIN 400 MG PO CAPS: ORAL_CAPSULE | ORAL | 0 refills | Status: DC

## 2018-07-20 NOTE — Progress Notes (Signed)
   CC: Diabetes Mellitus follow up  HPI:  [name] is a 68 y.o. with hypertension, diabetes mellitus type 2, depressive disorder, and iron deficiency anemia who presents for follow up. Please see problem based charting for evaluation, assessment, and plan.   Past Medical History:  Diagnosis Date  . Allergy   . Anemia   . Arthritis    "left knee" (09/08/2015)  . Asthma   . Blood transfusion without reported diagnosis 10/2017   anemic  . Brachial plexus disorders   . CHF (congestive heart failure) ([place])   . Chronic pain    neck pain, headache, neuropathy  . COPD (chronic obstructive pulmonary disease) ([place])    SEES ONLY DR. PATEL   . Depression    "when my husband passed in 2013"  . Facial trauma 05/2018  . Fall 04/19/2018  . Gastric ulcer   . GERD (gastroesophageal reflux disease)   . Hypertension   . Hypertriglyceridemia   . Migraine    "monthly" (09/08/2015)  . Neuromuscular disorder (HCC)    neuropathy  . Puncture wound of foot, right 05/17/2012   Tetanus shot 3 yrs ago at [facility] in [place], per pt report   . Stroke ([place])    TIAS    IN [place]   4 YRS AGO   . Type II diabetes mellitus ([place]) 2010   diagnosed around 2010, only ever on metformin   Review of Systems:    Review of Systems  Constitutional: Negative for chills and fever.  Eyes: Positive for double vision.  Respiratory: Negative for cough.   Cardiovascular: Negative for chest pain.  Gastrointestinal: Positive for nausea and vomiting. Negative for abdominal pain.  Neurological: Negative for dizziness.     Physical Exam:  Vitals:   07/20/18 1533  BP: 127/80  Pulse: 82  Temp: (!) 97.5 F (36.4 C)  TempSrc: Axillary  SpO2: 97%  Weight: 167 lb 11.2 oz (76.1 kg)  Height: 5\' 6"  (1.676 m)   Physical Exam  Constitutional: Appears well-developed and well-nourished. No distress.  HENT:  Head: Normocephalic and atraumatic.  Eyes: Conjunctivae are normal.  Cardiovascular: Normal  rate, regular rhythm and normal heart sounds.  Respiratory: Effort normal and breath sounds normal. No respiratory distress. No wheezes.  GI: Soft. Bowel sounds are normal. No distension. There is no tenderness.  Musculoskeletal: No edema.  Neurological: Is alert.  Skin: Not diaphoretic. No erythema.  Psychiatric: Normal mood and affect. Behavior is normal. Judgment and thought content normal.    Assessment & Plan:   See Encounters Tab for problem based charting.  Patient discussed with Dr. Heber Lancaster

## 2018-07-20 NOTE — Patient Instructions (Addendum)
It was a pleasure to see you today [name]. Please make the following changes:  -please increase your gabapentin from 300mg  three times a day to 400mg  three times a day -please avoid fatty, fried foods, soft drinks, and artificial sweetners -Continue all your other medications. Your a1c has improved to 7.3!  If you have any questions or concerns, please call our clinic at [phone] between 9am-5pm and after hours call [phone] and ask for the internal medicine resident on call. If you feel you are having a medical emergency please call 911.   Thank you, we look forward to help you remain healthy!  Julia Mage, MD Internal Medicine PGY2

## 2018-07-20 NOTE — Progress Notes (Deleted)
   CC: ***  HPI:  [name] is a 68 y.o. with hypertension, diabetes mellitus type 2, depressive disorder, iron deficiency anemia,  Hypertension The patient's blood pressure during this visit was ***. The patient is currently taking ***. His last blood pressure visits are  BP Readings from Last 3 Encounters:  06/26/18 (!) 163/72  06/21/18 (!) 158/76  06/18/18 (!) 147/59    The patient does/does not *** report palpitations, dizziness, chest pain, sob ***.  Assessment and Plan ***  DM The patient's last a1c=8.8 in September 2019. The patient's home  blood glucose measurements over the past month have ranged ***. The patient does/does not note episodes of hypoglycemia.   The patient is currently taking metformin ***. The patient is compliant with medication.    Patient's weight changes ***   Iron def Patient's last CBC was done in June 14, 2018 during which hemoglobin was stable at 14 hematocrit 46.  Symptoms ***  Syncope     Past Medical History:  Diagnosis Date  . Allergy   . Anemia   . Arthritis    "left knee" (09/08/2015)  . Asthma   . Blood transfusion without reported diagnosis 10/2017   anemic  . Brachial plexus disorders   . CHF (congestive heart failure) ([place])   . Chronic pain    neck pain, headache, neuropathy  . COPD (chronic obstructive pulmonary disease) ([place])    SEES ONLY DR. PATEL   . Depression    "when my husband passed in 2013"  . Facial trauma 05/2018  . Gastric ulcer   . GERD (gastroesophageal reflux disease)   . Hypertension   . Hypertriglyceridemia   . Migraine    "monthly" (09/08/2015)  . Neuromuscular disorder (HCC)    neuropathy  . Puncture wound of foot, right 05/17/2012   Tetanus shot 3 yrs ago at [facility] in [place], per pt report   . Stroke ([place])    TIAS    [place]   4 YRS AGO   . Type II diabetes mellitus ([place]) 2010   diagnosed around 2010, only ever on metformin   Review of Systems:  ***  Physical  Exam:  There were no vitals filed for this visit. ***  Assessment & Plan:   See Encounters Tab for problem based charting.  Patient {GC/GE:3044014::"discussed with","seen with"} Dr. {NAMES:[id number]::"Butcher","Granfortuna","E. Hoffman","Klima","Mullen","Narendra","Raines","Vincent"}

## 2018-07-21 ENCOUNTER — Encounter: Payer: Self-pay | Admitting: Internal Medicine

## 2018-07-21 DIAGNOSIS — R1110 Vomiting, unspecified: Secondary | ICD-10-CM | POA: Insufficient documentation

## 2018-07-21 NOTE — Assessment & Plan Note (Signed)
The patient's blood pressure during this visit was 127/80, 82 and is at goal without being on any antihypertensive agents. This improvement in her blood pressure can possibly be attributed due to weight loss.

## 2018-07-21 NOTE — Assessment & Plan Note (Signed)
Ordered dexa scan and microalb/cr ratio but the patient was not able to urinate to do the test

## 2018-07-21 NOTE — Assessment & Plan Note (Signed)
Patient's last CBC was done in June 14, 2018 during which hemoglobin was stable at 14 hematocrit 46.  The patient states that she still has some fatigue. Will recheck hb at next visit.

## 2018-07-21 NOTE — Assessment & Plan Note (Addendum)
The patient's last a1c=8.8 in September 2019 and during this visit it is 7.3. The patient does not note episodes of hypoglycemia. The patient is currently taking metformin 750mg  bid and januvia 50mg  qd.   Assessment and plan  Recommended patient to continue metformin 750mg  bid.

## 2018-07-21 NOTE — Assessment & Plan Note (Signed)
The patient mentioned that she has been vomiting once or twice a month. The vomitus content is whatever she eats. She does not have any accompanied abdominal pain, fever, chills, diarrhea or constipatition. She mentioned that she has been feeling this way for several years and her previous provider told her to drink more water. She states that she vomits even when she drinks water.   Assessment and plan  The patient's thyroid level is appropriate. She does have peripheral neuropathy which causes concern for gastroparesis as well, but the symptoms are not to the degree that would be expected in gastroparesis. The patient denies any acid reflux symptoms.   It is possible that the patient's diet, use of artificial sweetners, and soda intake can be causing her nausea and vomiting. I counseled her on avoiding artificial sweetners, fried food, spicy food, and sodas. I will follow up and see if dietary change has helped her.

## 2018-07-21 NOTE — Assessment & Plan Note (Addendum)
  The patient states that she has been falling 2-3 times per month still. She was seen by neurology in November 2019. Neurology recommeded eeg, mri head,mra neck, and emg. 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 She was recommended to have surveillance intracranial mra for this. EEG was normal. 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   The patient mentioned that she has had difficulty with her vision (strabismus) and has not been able to buy new glasses (and she lost her old pair). She also mentioned that she had been hving increased bilateral lower extremity discomfort that is worse at nighttime. She describes the pain as constant, throbbing in nature. She has been seen by podiatry and recommended to get special shoes which she has not done.   Assessment and plan  Etiology of the patients recurrent syncopal episodes and falls is multifactorial. Her underlying neuropathy is making gait difficult for which I will increase her gabapentin. 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   -Recommended the patient pick up shoes with padding from podiatry -Increased gabapentin from 300mg  tid to 400mg  tid -Recommeded the patient speak to ophthalmology to get new glasses to help strabismus and see if they have any programs who can provide financial assistance for her glasses. -will plan for surveillance intracranial mra at later date

## 2018-07-23 ENCOUNTER — Ambulatory Visit (INDEPENDENT_AMBULATORY_CARE_PROVIDER_SITE_OTHER): Payer: Medicare Other | Admitting: Podiatry

## 2018-07-23 ENCOUNTER — Encounter: Payer: Self-pay | Admitting: Podiatry

## 2018-07-23 DIAGNOSIS — B351 Tinea unguium: Secondary | ICD-10-CM

## 2018-07-23 DIAGNOSIS — L84 Corns and callosities: Secondary | ICD-10-CM

## 2018-07-23 DIAGNOSIS — S98111A Complete traumatic amputation of right great toe, initial encounter: Secondary | ICD-10-CM

## 2018-07-23 DIAGNOSIS — E1142 Type 2 diabetes mellitus with diabetic polyneuropathy: Secondary | ICD-10-CM

## 2018-07-23 DIAGNOSIS — M79675 Pain in left toe(s): Secondary | ICD-10-CM | POA: Diagnosis not present

## 2018-07-23 DIAGNOSIS — M79674 Pain in right toe(s): Secondary | ICD-10-CM | POA: Diagnosis not present

## 2018-07-23 NOTE — Progress Notes (Signed)
Internal Medicine Clinic Attending  Case discussed with Dr. Chundi at the time of the visit.  We reviewed the resident's history and exam and pertinent patient test results.  I agree with the assessment, diagnosis, and plan of care documented in the resident's note. 

## 2018-07-23 NOTE — Progress Notes (Signed)
Subjective: [name] is a 68 y.o. y.o. female with h/o amputation who presents for preventative foot care today with diabetes, diabetic neuropathy.   She has h/o ulceration submetatarsal head 5 right foot which has healed. . She relates tenderness to the area today. She denies any drainage, redness or swelling from the area.  She is concerned about the 5th metatarsal base left foot today which she has some discomfort.  She is here with her granddaughter and her granddaughter's husband.  [name] states she would like to start the process for diabetic shoes on today's visit.   Lars Mage, MD  is her PCP and last dos was 07/20/2018.   Current Outpatient Medications:  .  albuterol (PROVENTIL HFA;VENTOLIN HFA) 108 (90 Base) MCG/ACT inhaler, Inhale 1-2 puffs into the lungs every 6 (six) hours as needed for wheezing or shortness of breath., Disp: 1 Inhaler, Rfl: 5 .  gabapentin (NEURONTIN) 400 MG capsule, TAKE 1 CAPSULE BY MOUTH THREE TIMES DAILY, Disp: 90 capsule, Rfl: 0 .  JANUVIA 50 MG tablet, TAKE 1 TABLET(50 MG) BY MOUTH DAILY (Patient taking differently: Take 50 mg by mouth daily. ), Disp: 90 tablet, Rfl: 0 .  metFORMIN (GLUMETZA) 1000 MG (MOD) 24 hr tablet, TAKE 1 TABLET BY MOUTH TWICE DAILY (Patient taking differently: Take 750 mg by mouth 2 (two) times daily with a meal. ), Disp: 180 tablet, Rfl: 0 .  ondansetron (ZOFRAN) 4 MG tablet, Take 1 tablet (4 mg total) by mouth every 6 (six) hours as needed for nausea or vomiting., Disp: 20 tablet, Rfl: 0 .  pantoprazole (PROTONIX) 40 MG tablet, Take 1 tablet (40 mg total) by mouth 2 (two) times daily. TAKE 1 TABLET(40 MG) BY MOUTH twice per day.  Before 1st and last meal of the day. (Patient taking differently: Take 40 mg by mouth 2 (two) times daily before a meal. ), Disp: 60 tablet, Rfl: 12 .  rosuvastatin (CRESTOR) 40 MG tablet, TAKE 1 TABLET BY MOUTH EVERY DAY (Patient taking differently: Take 40 mg by mouth daily. ), Disp: 90  tablet, Rfl: 0 .  sertraline (ZOLOFT) 100 MG tablet, TAKE 1 TABLET(100 MG) BY MOUTH DAILY (Patient taking differently: Take 100 mg by mouth daily. ), Disp: 90 tablet, Rfl: 0  Allergies  Allergen Reactions  . Anesthesia S-I-60 Other (See Comments)    Hallucinations.  Yvette Rack [Cyclobenzaprine] Other (See Comments)    Prolonged QTc to 571, tachycardia  . Soma [Carisoprodol] Itching and Rash    Objective: Vascular Examination: Capillary refill time <3 seconds  Dorsalis pedis pulses palpable b/l Posterior tibial pulses palable b/l No digital hair x 10 digits Skin temperature WNL b/l  Dermatological Examination: Skin thin, shiny and atrophic b/l  Hemorrhagic hyperkeratotic lesion noted submetatarsal head 5 b/l with tenderness to palpation. No flocculence, no edema, no erythema, no underlying open wound. No warmth.   Musculoskeletal: Muscle strength 5/5 to all LE muscle groups  Hallux amputation right foot  Neurological: Sensation intact with 10 gram monofilament.  Assessment: 1. Preulcerative callus submet head 5 b/l right > left 2. NIDDM with diabetic neuropathy 3. S/p hallux amputation right foot  Plan: 1. Discuss diabetic foot care principles. Literature dispensed.. 2. She is requesting diabetic shoes on today. We will start process for her to obtain new shoes. I have also asked her to bring in her old shoes for her Pedorthist's appointment. 3. Hyperkeratotic lesion(s) pared submet head 5 right foot with sterile blade and gently smoothed with burr. 4. Patient  to continue soft, supportive shoe gear 5. Patient to report any pedal injuries to medical professional  6. Follow up 6 weeks.   7. Patient/POA to call should there be a concern in the interim.

## 2018-07-24 DIAGNOSIS — F17200 Nicotine dependence, unspecified, uncomplicated: Secondary | ICD-10-CM | POA: Diagnosis not present

## 2018-07-24 DIAGNOSIS — E1142 Type 2 diabetes mellitus with diabetic polyneuropathy: Secondary | ICD-10-CM | POA: Diagnosis not present

## 2018-07-24 DIAGNOSIS — D500 Iron deficiency anemia secondary to blood loss (chronic): Secondary | ICD-10-CM | POA: Diagnosis not present

## 2018-07-24 DIAGNOSIS — R269 Unspecified abnormalities of gait and mobility: Secondary | ICD-10-CM | POA: Diagnosis not present

## 2018-07-24 DIAGNOSIS — S0990XD Unspecified injury of head, subsequent encounter: Secondary | ICD-10-CM | POA: Diagnosis not present

## 2018-07-24 DIAGNOSIS — M1712 Unilateral primary osteoarthritis, left knee: Secondary | ICD-10-CM | POA: Diagnosis not present

## 2018-07-26 ENCOUNTER — Telehealth: Payer: Self-pay | Admitting: Internal Medicine

## 2018-07-26 NOTE — Telephone Encounter (Signed)
Suanne Marker wanted to report patient has bed bug; want to put pt therapy on hold. [phone]

## 2018-07-27 ENCOUNTER — Ambulatory Visit: Payer: Medicare Other | Admitting: Orthotics

## 2018-07-27 DIAGNOSIS — L84 Corns and callosities: Secondary | ICD-10-CM

## 2018-07-27 DIAGNOSIS — S98111A Complete traumatic amputation of right great toe, initial encounter: Secondary | ICD-10-CM

## 2018-07-27 DIAGNOSIS — B351 Tinea unguium: Secondary | ICD-10-CM

## 2018-07-27 DIAGNOSIS — E1142 Type 2 diabetes mellitus with diabetic polyneuropathy: Secondary | ICD-10-CM

## 2018-07-27 DIAGNOSIS — M79675 Pain in left toe(s): Principal | ICD-10-CM

## 2018-07-27 DIAGNOSIS — M79674 Pain in right toe(s): Principal | ICD-10-CM

## 2018-07-27 NOTE — Telephone Encounter (Signed)
Thank you :)

## 2018-07-28 DIAGNOSIS — W19XXXD Unspecified fall, subsequent encounter: Secondary | ICD-10-CM | POA: Diagnosis not present

## 2018-07-28 DIAGNOSIS — M1712 Unilateral primary osteoarthritis, left knee: Secondary | ICD-10-CM | POA: Diagnosis not present

## 2018-07-28 DIAGNOSIS — Z9181 History of falling: Secondary | ICD-10-CM | POA: Diagnosis not present

## 2018-07-28 DIAGNOSIS — Z8673 Personal history of transient ischemic attack (TIA), and cerebral infarction without residual deficits: Secondary | ICD-10-CM | POA: Diagnosis not present

## 2018-07-28 DIAGNOSIS — S0990XD Unspecified injury of head, subsequent encounter: Secondary | ICD-10-CM | POA: Diagnosis not present

## 2018-07-28 DIAGNOSIS — E1142 Type 2 diabetes mellitus with diabetic polyneuropathy: Secondary | ICD-10-CM | POA: Diagnosis not present

## 2018-07-28 DIAGNOSIS — M25512 Pain in left shoulder: Secondary | ICD-10-CM | POA: Diagnosis not present

## 2018-07-28 DIAGNOSIS — R269 Unspecified abnormalities of gait and mobility: Secondary | ICD-10-CM | POA: Diagnosis not present

## 2018-07-28 DIAGNOSIS — F17200 Nicotine dependence, unspecified, uncomplicated: Secondary | ICD-10-CM | POA: Diagnosis not present

## 2018-07-28 DIAGNOSIS — D500 Iron deficiency anemia secondary to blood loss (chronic): Secondary | ICD-10-CM | POA: Diagnosis not present

## 2018-07-28 DIAGNOSIS — Z7984 Long term (current) use of oral hypoglycemic drugs: Secondary | ICD-10-CM | POA: Diagnosis not present

## 2018-07-30 NOTE — Progress Notes (Signed)

## 2018-08-09 DIAGNOSIS — R269 Unspecified abnormalities of gait and mobility: Secondary | ICD-10-CM | POA: Diagnosis not present

## 2018-08-09 DIAGNOSIS — F17200 Nicotine dependence, unspecified, uncomplicated: Secondary | ICD-10-CM | POA: Diagnosis not present

## 2018-08-09 DIAGNOSIS — S0990XD Unspecified injury of head, subsequent encounter: Secondary | ICD-10-CM | POA: Diagnosis not present

## 2018-08-09 DIAGNOSIS — D500 Iron deficiency anemia secondary to blood loss (chronic): Secondary | ICD-10-CM | POA: Diagnosis not present

## 2018-08-09 DIAGNOSIS — M1712 Unilateral primary osteoarthritis, left knee: Secondary | ICD-10-CM | POA: Diagnosis not present

## 2018-08-09 DIAGNOSIS — E1142 Type 2 diabetes mellitus with diabetic polyneuropathy: Secondary | ICD-10-CM | POA: Diagnosis not present

## 2018-08-15 DIAGNOSIS — E1142 Type 2 diabetes mellitus with diabetic polyneuropathy: Secondary | ICD-10-CM | POA: Diagnosis not present

## 2018-08-15 DIAGNOSIS — F17200 Nicotine dependence, unspecified, uncomplicated: Secondary | ICD-10-CM | POA: Diagnosis not present

## 2018-08-15 DIAGNOSIS — S0990XD Unspecified injury of head, subsequent encounter: Secondary | ICD-10-CM | POA: Diagnosis not present

## 2018-08-15 DIAGNOSIS — R269 Unspecified abnormalities of gait and mobility: Secondary | ICD-10-CM | POA: Diagnosis not present

## 2018-08-15 DIAGNOSIS — D500 Iron deficiency anemia secondary to blood loss (chronic): Secondary | ICD-10-CM | POA: Diagnosis not present

## 2018-08-15 DIAGNOSIS — M1712 Unilateral primary osteoarthritis, left knee: Secondary | ICD-10-CM | POA: Diagnosis not present

## 2018-08-16 DIAGNOSIS — H5017 Alternating exotropia with V pattern: Secondary | ICD-10-CM | POA: Diagnosis not present

## 2018-08-16 DIAGNOSIS — H40013 Open angle with borderline findings, low risk, bilateral: Secondary | ICD-10-CM | POA: Diagnosis not present

## 2018-08-16 DIAGNOSIS — H532 Diplopia: Secondary | ICD-10-CM | POA: Diagnosis not present

## 2018-08-16 LAB — HM DIABETES EYE EXAM

## 2018-08-17 ENCOUNTER — Other Ambulatory Visit: Payer: Self-pay | Admitting: Internal Medicine

## 2018-08-17 DIAGNOSIS — D500 Iron deficiency anemia secondary to blood loss (chronic): Secondary | ICD-10-CM | POA: Diagnosis not present

## 2018-08-17 DIAGNOSIS — E1142 Type 2 diabetes mellitus with diabetic polyneuropathy: Secondary | ICD-10-CM

## 2018-08-17 DIAGNOSIS — F17200 Nicotine dependence, unspecified, uncomplicated: Secondary | ICD-10-CM | POA: Diagnosis not present

## 2018-08-17 DIAGNOSIS — R269 Unspecified abnormalities of gait and mobility: Secondary | ICD-10-CM | POA: Diagnosis not present

## 2018-08-17 DIAGNOSIS — S0990XD Unspecified injury of head, subsequent encounter: Secondary | ICD-10-CM | POA: Diagnosis not present

## 2018-08-17 DIAGNOSIS — M1712 Unilateral primary osteoarthritis, left knee: Secondary | ICD-10-CM | POA: Diagnosis not present

## 2018-09-04 ENCOUNTER — Ambulatory Visit: Payer: Medicare Other | Admitting: Podiatry

## 2018-09-04 ENCOUNTER — Other Ambulatory Visit: Payer: Self-pay | Admitting: Internal Medicine

## 2018-09-04 DIAGNOSIS — F322 Major depressive disorder, single episode, severe without psychotic features: Secondary | ICD-10-CM

## 2018-09-04 DIAGNOSIS — E118 Type 2 diabetes mellitus with unspecified complications: Secondary | ICD-10-CM

## 2018-09-05 ENCOUNTER — Ambulatory Visit: Payer: Medicare Other

## 2018-09-05 ENCOUNTER — Other Ambulatory Visit: Payer: Self-pay

## 2018-09-05 ENCOUNTER — Telehealth: Payer: Self-pay | Admitting: *Deleted

## 2018-09-05 ENCOUNTER — Emergency Department (HOSPITAL_COMMUNITY): Payer: Medicare Other

## 2018-09-05 ENCOUNTER — Inpatient Hospital Stay (HOSPITAL_COMMUNITY)
Admission: EM | Admit: 2018-09-05 | Discharge: 2018-09-07 | DRG: 661 | Disposition: A | Discharge status: Home / Self Care | Payer: Medicare Other | Attending: Internal Medicine | Admitting: Internal Medicine

## 2018-09-05 ENCOUNTER — Encounter (HOSPITAL_COMMUNITY): Payer: Self-pay | Admitting: Emergency Medicine

## 2018-09-05 DIAGNOSIS — R0789 Other chest pain: Secondary | ICD-10-CM | POA: Diagnosis not present

## 2018-09-05 DIAGNOSIS — Z96652 Presence of left artificial knee joint: Secondary | ICD-10-CM | POA: Diagnosis present

## 2018-09-05 DIAGNOSIS — Z7984 Long term (current) use of oral hypoglycemic drugs: Secondary | ICD-10-CM

## 2018-09-05 DIAGNOSIS — N200 Calculus of kidney: Secondary | ICD-10-CM

## 2018-09-05 DIAGNOSIS — I110 Hypertensive heart disease with heart failure: Secondary | ICD-10-CM | POA: Diagnosis not present

## 2018-09-05 DIAGNOSIS — N132 Hydronephrosis with renal and ureteral calculous obstruction: Secondary | ICD-10-CM | POA: Diagnosis not present

## 2018-09-05 DIAGNOSIS — N136 Pyonephrosis: Secondary | ICD-10-CM | POA: Diagnosis not present

## 2018-09-05 DIAGNOSIS — R112 Nausea with vomiting, unspecified: Secondary | ICD-10-CM

## 2018-09-05 DIAGNOSIS — F17210 Nicotine dependence, cigarettes, uncomplicated: Secondary | ICD-10-CM | POA: Diagnosis present

## 2018-09-05 DIAGNOSIS — Z8349 Family history of other endocrine, nutritional and metabolic diseases: Secondary | ICD-10-CM

## 2018-09-05 DIAGNOSIS — I509 Heart failure, unspecified: Secondary | ICD-10-CM | POA: Diagnosis present

## 2018-09-05 DIAGNOSIS — R0602 Shortness of breath: Secondary | ICD-10-CM

## 2018-09-05 DIAGNOSIS — J449 Chronic obstructive pulmonary disease, unspecified: Secondary | ICD-10-CM | POA: Diagnosis not present

## 2018-09-05 DIAGNOSIS — F329 Major depressive disorder, single episode, unspecified: Secondary | ICD-10-CM | POA: Diagnosis present

## 2018-09-05 DIAGNOSIS — Z8249 Family history of ischemic heart disease and other diseases of the circulatory system: Secondary | ICD-10-CM

## 2018-09-05 DIAGNOSIS — Z888 Allergy status to other drugs, medicaments and biological substances status: Secondary | ICD-10-CM

## 2018-09-05 DIAGNOSIS — N201 Calculus of ureter: Secondary | ICD-10-CM

## 2018-09-05 DIAGNOSIS — I2510 Atherosclerotic heart disease of native coronary artery without angina pectoris: Secondary | ICD-10-CM | POA: Diagnosis present

## 2018-09-05 DIAGNOSIS — E781 Pure hyperglyceridemia: Secondary | ICD-10-CM | POA: Diagnosis not present

## 2018-09-05 DIAGNOSIS — R1010 Upper abdominal pain, unspecified: Secondary | ICD-10-CM

## 2018-09-05 DIAGNOSIS — E1142 Type 2 diabetes mellitus with diabetic polyneuropathy: Secondary | ICD-10-CM | POA: Diagnosis present

## 2018-09-05 DIAGNOSIS — Z884 Allergy status to anesthetic agent status: Secondary | ICD-10-CM

## 2018-09-05 DIAGNOSIS — R079 Chest pain, unspecified: Secondary | ICD-10-CM

## 2018-09-05 DIAGNOSIS — K219 Gastro-esophageal reflux disease without esophagitis: Secondary | ICD-10-CM | POA: Diagnosis not present

## 2018-09-05 DIAGNOSIS — Z419 Encounter for procedure for purposes other than remedying health state, unspecified: Secondary | ICD-10-CM

## 2018-09-05 DIAGNOSIS — N39498 Other specified urinary incontinence: Secondary | ICD-10-CM | POA: Diagnosis present

## 2018-09-05 LAB — COMPREHENSIVE METABOLIC PANEL
ALBUMIN: 4.2 g/dL (ref 3.5–5.0)
ALT: 65 U/L — AB (ref 0–44)
AST: 45 U/L — ABNORMAL HIGH (ref 15–41)
Alkaline Phosphatase: 69 U/L (ref 38–126)
Anion gap: 10 (ref 5–15)
BUN: 12 mg/dL (ref 8–23)
CO2: 23 mmol/L (ref 22–32)
Calcium: 9.8 mg/dL (ref 8.9–10.3)
Chloride: 107 mmol/L (ref 98–111)
Creatinine, Ser: 1.06 mg/dL — ABNORMAL HIGH (ref 0.44–1.00)
GFR calc Af Amer: >60 mL/min (ref >60)
GFR calc non Af Amer: 55 mL/min — ABNORMAL LOW (ref >60)
GLUCOSE: 140 mg/dL — AB (ref 70–99)
Potassium: 3.7 mmol/L (ref 3.5–5.1)
Sodium: 140 mmol/L (ref 135–145)
Total Bilirubin: 0.6 mg/dL (ref 0.3–1.2)
Total Protein: 7.5 g/dL (ref 6.5–8.1)

## 2018-09-05 LAB — CBC WITH DIFFERENTIAL/PLATELET
Abs Immature Granulocytes: 0.07 10*3/uL (ref 0.00–0.07)
Basophils Absolute: 0.1 10*3/uL (ref 0.0–0.1)
Basophils Relative: 1 %
Eosinophils Absolute: 1.5 10*3/uL — ABNORMAL HIGH (ref 0.0–0.5)
Eosinophils Relative: 13 %
HCT: 44.2 % (ref 36.0–46.0)
Hemoglobin: 13.7 g/dL (ref 12.0–15.0)
Immature Granulocytes: 1 %
Lymphocytes Relative: 23 %
Lymphs Abs: 2.6 10*3/uL (ref 0.7–4.0)
MCH: 30 pg (ref 26.0–34.0)
MCHC: 31 g/dL (ref 30.0–36.0)
MCV: 96.9 fL (ref 80.0–100.0)
Monocytes Absolute: 0.9 10*3/uL (ref 0.1–1.0)
Monocytes Relative: 8 %
Neutro Abs: 6.2 10*3/uL (ref 1.7–7.7)
Neutrophils Relative %: 54 %
Platelets: 166 10*3/uL (ref 150–400)
RBC: 4.56 MIL/uL (ref 3.87–5.11)
RDW: 14.7 % (ref 11.5–15.5)
WBC: 11.4 10*3/uL — AB (ref 4.0–10.5)
nRBC: 0.2 % (ref 0.0–0.2)

## 2018-09-05 LAB — INFLUENZA PANEL BY PCR (TYPE A & B)
Influenza A By PCR: NEGATIVE
Influenza B By PCR: NEGATIVE

## 2018-09-05 LAB — BRAIN NATRIURETIC PEPTIDE: B Natriuretic Peptide: 176.1 pg/mL — ABNORMAL HIGH (ref 0.0–100.0)

## 2018-09-05 LAB — TROPONIN I: Troponin I: <0.03 ng/mL (ref <0.03)

## 2018-09-05 MED ORDER — SODIUM CHLORIDE 0.9 % IV BOLUS
500.0000 mL | Freq: Once | INTRAVENOUS | Status: AC
  Administered 2018-09-05: 500 mL via INTRAVENOUS

## 2018-09-05 MED ORDER — MORPHINE SULFATE (PF) 4 MG/ML IV SOLN
4.0000 mg | Freq: Once | INTRAVENOUS | Status: AC
  Administered 2018-09-05: 4 mg via INTRAVENOUS
  Filled 2018-09-05: qty 1

## 2018-09-05 MED ORDER — ONDANSETRON HCL 4 MG/2ML IJ SOLN
4.0000 mg | Freq: Once | INTRAMUSCULAR | Status: AC
  Administered 2018-09-05: 4 mg via INTRAVENOUS
  Filled 2018-09-05: qty 2

## 2018-09-05 MED ORDER — DOXYCYCLINE HYCLATE 100 MG PO TABS: 100.0000 mg | ORAL_TABLET | Freq: Two times a day (BID) | ORAL | Status: DC

## 2018-09-05 MED ORDER — ALBUTEROL SULFATE HFA 108 (90 BASE) MCG/ACT IN AERS
2.0000 | INHALATION_SPRAY | RESPIRATORY_TRACT | Status: DC
  Administered 2018-09-05: 2 via RESPIRATORY_TRACT
  Filled 2018-09-05: qty 6.7

## 2018-09-05 MED ORDER — IOHEXOL 350 MG/ML SOLN
100.0000 mL | Freq: Once | INTRAVENOUS | Status: AC | PRN
  Administered 2018-09-05: 100 mL via INTRAVENOUS

## 2018-09-05 NOTE — Telephone Encounter (Signed)
NURSING TRIAGE NOTE FOR RESPIRATORY SYMPTOMS  Do you have a fever? No  Do you have a cough? Yes - productive of " a lot of clear mucous" Do you have shortness of breath more than normal? Yes - severe OR while sitting some with sitting "a lot with walking" Do you have chest pain?No Are you able to eat and drink normally? Yes but not as much; has no appetite, only one real meal in last week in addition to crackers. Is drinking 2 regular size bottle of water per day. Vomited twice a week ago. Have you seen a physician for these symptoms? No   Patient main complaint is no energy X 7 days. Sleeping a lot more than normal. Also has sinus congestion. Has not traveled and has not been around anyone who has traveled. There are 8 people in her home. Her daughter has nasal and chest congestion but no fevers. Granddaughter with "nasty cough" non-productive also without fevers.  Action information sent to physician to conduct a phone appoitment  I informed patient to expect a phone call from a physician soon and I sent request to front desk to schedule a virtual office appt for patient and arrive the patient.

## 2018-09-05 NOTE — ED Notes (Signed)
Pt unable to produce UA sample.

## 2018-09-05 NOTE — Telephone Encounter (Signed)
Spoke with Attending, Dr. Evette Doffing who recommends patient be seen in ED for [facility]. Patient notified. States she will go to ED as soon as someone is home who can drive her. Discussed good hand hygiene and cough etiquette for everyone in household. She will continue to to try to stay well hydrated. Hubbard Hartshorn, RN, BSN

## 2018-09-05 NOTE — ED Provider Notes (Signed)
[facility] EMERGENCY DEPARTMENT Provider Note   CSN: [id number] Arrival date & time: 09/05/18  2137    History   Chief Complaint Chief Complaint  Patient presents with   Emesis   Shortness of Breath    HPI [name] is a 68 y.o. female.  She has a history of CHF and COPD.  She is complaining of vomiting twice a day for about a week.  She has been short of breath for a few days coughing up some white sputum.  She is complaining of a little chest pain but then she revises that and says it is more work of breathing and short of breath.  Does not think she is had any fevers.  She has had a headache and generalized body aches.  Is also complaining of been incontinent of urine which is new for her.  No recent travel.  No abdominal pain.     The history is provided by the patient.  Emesis  Severity:  Moderate Duration:  1 week Timing:  Intermittent Number of daily episodes:  2 Progression:  Unchanged Chronicity:  New Recent urination:  Normal Associated symptoms: arthralgias, cough, headaches and myalgias   Associated symptoms: no abdominal pain, no diarrhea, no fever, no sore throat and no URI   Shortness of Breath  Severity:  Moderate Onset quality:  Gradual Duration:  2 days Timing:  Constant Progression:  Worsening Chronicity:  New Relieved by:  Nothing Worsened by:  Nothing Ineffective treatments:  None tried Associated symptoms: cough, headaches, sputum production (white) and vomiting   Associated symptoms: no abdominal pain, no chest pain, no fever, no rash and no sore throat   Risk factors: tobacco use     Past Medical History:  Diagnosis Date   Allergy    Anemia    Arthritis    "left knee" (09/08/2015)   Asthma    Blood transfusion without reported diagnosis 10/2017   anemic   Brachial plexus disorders    CHF (congestive heart failure) (HCC)    Chronic pain    neck pain, headache, neuropathy   COPD (chronic obstructive  pulmonary disease) ([place])    SEES ONLY DR. PATEL    Depression    "when my husband passed in 2013"   Facial trauma 05/2018   Fall 04/19/2018   Gastric ulcer    GERD (gastroesophageal reflux disease)    Hypertension    Hypertriglyceridemia    Migraine    "monthly" (09/08/2015)   Neuromuscular disorder ([place])    neuropathy   Puncture wound of foot, right 05/17/2012   Tetanus shot 3 yrs ago at [facility] in [place], per pt report    Stroke ([place])    [place]   4 YRS AGO    Type II diabetes mellitus ([place]) 2010   diagnosed around 2010, only ever on metformin    Patient Active Problem List   Diagnosis Date Noted   Vomiting 07/21/2018   Post concussive syndrome 06/20/2018   Diabetes mellitus treated with oral medication ([place])    Head trauma 06/18/2018   Intractable vomiting with nausea 06/18/2018   Facial trauma, initial encounter 06/18/2018   Pain in joint of left shoulder 05/15/2018   Lip laceration    Iron deficiency anemia due to chronic blood loss 01/05/2018   Symptomatic anemia 11/07/2017   Pressure injury of skin 11/07/2017   Acute gastric ulcer with hemorrhage    Pain in left knee 09/12/2017  Healthcare maintenance 09/09/2017   Allergic rhinitis 09/09/2017   Major depressive disorder 07/21/2017   Dysuria 06/16/2017   Demand ischemia ([place]) 06/16/2017   Petechiae 06/16/2017   AKI (acute kidney injury) ([place])    Right foot pain 04/22/2017   Blister (nonthermal) of right ring finger, initial encounter 01/21/2017   History of total knee replacement, 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   Neck muscle strain 08/19/2016   Onychomycosis of multiple toenails with type 2 diabetes mellitus ([place]) 11/04/2015   Skin lesions 11/04/2015   Syncope    Overweight (BMI 25.0-29.9) 05/27/2015   Leg ulcer, left ([place]) 03/05/2015   Recurrent falls 03/05/2015   Primary osteoarthritis of left knee 09/22/2014   Esophageal reflux 07/18/2014   Hyperlipidemia  associated with type 2 diabetes mellitus ([place]) 02/26/2014   Tobacco use disorder 07/22/2013   Headache 06/28/2013   Diabetic peripheral neuropathy associated with type 2 diabetes mellitus ([place]) 06/14/2013   Hx-TIA (transient ischemic attack) 03/10/2013   Diabetes mellitus type 2, controlled, with complications ([place]) 15/40/0867   Hypertension 05/13/2012    Past Surgical History:  Procedure Laterality Date   AMPUTATION Right 05/01/2015   Procedure: Right Foot 1st Ray Amputation;  Surgeon: Newt Minion, MD;  Location: [place];  Service: Orthopedics;  Laterality: Right;   ARTHRODESIS METATARSAL     RIGHT 5 TH    BILATERAL SALPINGOOPHORECTOMY Bilateral 1986   "after hemtoma evacuations; had to cut me open"   BIOPSY  11/07/2017   Procedure: BIOPSY;  Surgeon: Irene Shipper, MD;  Location: [facility] ENDOSCOPY;  Service: Endoscopy;;   CARPAL TUNNEL RELEASE Bilateral    COLONOSCOPY     10 + yrs ago- pt unsure- was in [place]- pt states  MD will not send records but colon was normal per pt.    DILATION AND CURETTAGE OF UTERUS  ~ 1982   S/P miscarriage   ESOPHAGOGASTRODUODENOSCOPY (EGD) WITH PROPOFOL N/A 11/07/2017   Procedure: ESOPHAGOGASTRODUODENOSCOPY (EGD) WITH PROPOFOL;  Surgeon: Irene Shipper, MD;  Location: [facility] ENDOSCOPY;  Service: Endoscopy;  Laterality: N/A;   HEMATOMA EVACUATION Right 1986 x 2   "OVARY; w/in 1 wk after hysterectomy"   KNEE ARTHROSCOPY     LEFT   LAPAROSCOPIC CHOLECYSTECTOMY     SHOULDER ARTHROSCOPY W/ ROTATOR CUFF REPAIR Bilateral    TONSILLECTOMY     TOTAL KNEE ARTHROPLASTY Left 09/30/2016   Procedure: LEFT TOTAL KNEE ARTHROPLASTY;  Surgeon: Netta Cedars, MD;  Location: [facility];  Service: Orthopedics;  Laterality: Left;   TUBAL LIGATION     VAGINAL HYSTERECTOMY  1986     OB History   No obstetric history on file.      Home Medications    Prior to Admission medications   Medication Sig Start Date End Date Taking? Authorizing Provider    albuterol (PROVENTIL HFA;VENTOLIN HFA) 108 (90 Base) MCG/ACT inhaler Inhale 1-2 puffs into the lungs every 6 (six) hours as needed for wheezing or shortness of breath. 02/10/17   Lucious Groves, DO  gabapentin (NEURONTIN) 400 MG capsule TAKE 1 CAPSULE BY MOUTH THREE TIMES DAILY 08/18/18   Chundi, Vahini, MD  JANUVIA 50 MG tablet TAKE 1 TABLET(50 MG) BY MOUTH DAILY 09/04/18   Chundi, Verne Spurr, MD  metFORMIN (GLUMETZA) 1000 MG (MOD) 24 hr tablet Take 1 tablet (1,000 mg total) by mouth 2 (two) times daily with a meal. 09/04/18 12/03/18  Chundi, Verne Spurr, MD  ondansetron (ZOFRAN) 4 MG tablet Take 1 tablet (4 mg total) by mouth every 6 (six) hours as needed  for nausea or vomiting. 06/21/18   Dorrell, Andree Elk, MD  pantoprazole (PROTONIX) 40 MG tablet Take 1 tablet (40 mg total) by mouth 2 (two) times daily. TAKE 1 TABLET(40 MG) BY MOUTH twice per day.  Before 1st and last meal of the day. Patient taking differently: Take 40 mg by mouth 2 (two) times daily before a meal.  01/26/18   Pyrtle, Lajuan Lines, MD  rosuvastatin (CRESTOR) 40 MG tablet TAKE 1 TABLET BY MOUTH EVERY DAY 09/04/18   Chundi, Verne Spurr, MD  sertraline (ZOLOFT) 100 MG tablet TAKE 1 TABLET(100 MG) BY MOUTH DAILY 09/04/18   Lars Mage, MD    Family History Family History  Problem Relation Age of Onset   Hyperlipidemia Mother    Heart attack Father 72   Hypertension Father    Cancer Paternal Grandfather        Lung cancer   Cancer Maternal Grandmother    Heart attack Maternal Grandmother    Colon cancer Neg Hx    Colon polyps Neg Hx    Esophageal cancer Neg Hx    Rectal cancer Neg Hx    Stomach cancer Neg Hx     Social History Social History   Tobacco Use   Smoking status: Current Every Day Smoker    Packs/day: 0.30    Years: 49.00    Pack years: 14.70    Types: Cigarettes   Smokeless tobacco: Never Used   Tobacco comment: 4-5 cigs per day  Substance Use Topics   Alcohol use: No    Alcohol/week: 0.0 standard drinks    Drug use: No     Allergies   Anesthesia s-i-60; Flexeril [cyclobenzaprine]; and Soma [carisoprodol]   Review of Systems Review of Systems  Constitutional: Negative for fever.  HENT: Negative for sore throat.   Eyes: Negative for visual disturbance.  Respiratory: Positive for cough, sputum production (white), chest tightness and shortness of breath.   Cardiovascular: Negative for chest pain.  Gastrointestinal: Positive for nausea and vomiting. Negative for abdominal pain and diarrhea.  Genitourinary: Negative for dysuria.  Musculoskeletal: Positive for arthralgias and myalgias.  Skin: Negative for rash.  Neurological: Positive for headaches.     Physical Exam Updated Vital Signs BP (!) 184/83 (BP Location: Right Arm)    Pulse 62    Temp 98.3 F (36.8 C) (Oral)    Resp 18    Ht 5\' 6"  (1.676 m)    Wt 77.1 kg    SpO2 98%    BMI 27.44 kg/m   Physical Exam Vitals signs and nursing note reviewed.  Constitutional:      General: She is not in acute distress.    Appearance: She is well-developed.  HENT:     Head: Normocephalic and atraumatic.  Eyes:     Conjunctiva/sclera: Conjunctivae normal.  Neck:     Musculoskeletal: Neck supple.  Cardiovascular:     Rate and Rhythm: Normal rate and regular rhythm.     Heart sounds: No murmur.  Pulmonary:     Effort: Pulmonary effort is normal. No respiratory distress.     Breath sounds: Wheezing and rhonchi present.  Abdominal:     Palpations: Abdomen is soft.     Tenderness: There is no abdominal tenderness.  Musculoskeletal: Normal range of motion.     Right lower leg: She exhibits no tenderness. No edema.     Left lower leg: She exhibits no tenderness. No edema.  Skin:    General: Skin is warm and dry.  Capillary Refill: Capillary refill takes less than 2 seconds.  Neurological:     General: No focal deficit present.     Mental Status: She is alert and oriented to person, place, and time.     Motor: No weakness.      ED  Treatments / Results  Labs (all labs ordered are listed, but only abnormal results are displayed) Labs Reviewed  CBC WITH DIFFERENTIAL/PLATELET - Abnormal; Notable for the following components:      Result Value   WBC 11.4 (*)    Eosinophils Absolute 1.5 (*)    All other components within normal limits  COMPREHENSIVE METABOLIC PANEL - Abnormal; Notable for the following components:   Glucose, Bld 140 (*)    Creatinine, Ser 1.06 (*)    AST 45 (*)    ALT 65 (*)    GFR calc non Af Amer 55 (*)    All other components within normal limits  BRAIN NATRIURETIC PEPTIDE - Abnormal; Notable for the following components:   B Natriuretic Peptide 176.1 (*)    All other components within normal limits  URINALYSIS, ROUTINE W REFLEX MICROSCOPIC - Abnormal; Notable for the following components:   Hgb urine dipstick TRACE (*)    Protein, ur 30 (*)    Nitrite POSITIVE (*)    All other components within normal limits  CBC - Abnormal; Notable for the following components:   WBC 11.9 (*)    Hemoglobin 11.8 (*)    Platelets 132 (*)    All other components within normal limits  CREATININE, SERUM - Abnormal; Notable for the following components:   Creatinine, Ser 1.19 (*)    GFR calc non Af Amer 48 (*)    GFR calc Af Amer 55 (*)    All other components within normal limits  BASIC METABOLIC PANEL - Abnormal; Notable for the following components:   Glucose, Bld 128 (*)    Creatinine, Ser 1.27 (*)    Calcium 8.8 (*)    GFR calc non Af Amer 44 (*)    GFR calc Af Amer 51 (*)    All other components within normal limits  CBC - Abnormal; Notable for the following components:   WBC 10.8 (*)    RBC 3.75 (*)    Hemoglobin 11.6 (*)    Platelets 127 (*)    All other components within normal limits  GLUCOSE, CAPILLARY - Abnormal; Notable for the following components:   Glucose-Capillary 106 (*)    All other components within normal limits  URINALYSIS, MICROSCOPIC (REFLEX) - Abnormal; Notable for the  following components:   Bacteria, UA MANY (*)    All other components within normal limits  GLUCOSE, CAPILLARY - Abnormal; Notable for the following components:   Glucose-Capillary 100 (*)    All other components within normal limits  GLUCOSE, CAPILLARY - Abnormal; Notable for the following components:   Glucose-Capillary 110 (*)    All other components within normal limits  CBG MONITORING, ED - Abnormal; Notable for the following components:   Glucose-Capillary 164 (*)    All other components within normal limits  RESPIRATORY PANEL BY PCR  URINE CULTURE  INFLUENZA PANEL BY PCR (TYPE A & B)  TROPONIN I  HIV ANTIBODY (ROUTINE TESTING W REFLEX)  GLUCOSE, CAPILLARY  GLUCOSE, CAPILLARY    EKG EKG Interpretation  Date/Time:  Wednesday September 05 2018 21:46:13 EDT Ventricular Rate:  63 PR Interval:    QRS Duration: 90 QT Interval:  435 QTC Calculation: 446  R Axis:   18 Text Interpretation:  Sinus rhythm Anteroseptal infarct, old Abnormal T, consider ischemia, diffuse leads similar to prior 7/19 Confirmed by Aletta Edouard [phone]) on 09/05/2018 10:02:37 PM Also confirmed by Aletta Edouard [phone]), editor Philomena Doheny [phone])  on 09/06/2018 7:06:03 AM   Radiology Ct Angio Chest Pe W/cm &/or Wo Cm  Result Date: 09/05/2018 CLINICAL DATA:  68 year old female with chest pain and shortness of breath. Upper abdominal pain. EXAM: CT ANGIOGRAPHY CHEST CT ABDOMEN AND PELVIS WITH CONTRAST TECHNIQUE: Multidetector CT imaging of the chest was performed using the standard protocol during bolus administration of intravenous contrast. Multiplanar CT image reconstructions and MIPs were obtained to evaluate the vascular anatomy. Multidetector CT imaging of the abdomen and pelvis was performed using the standard protocol during bolus administration of intravenous contrast. CONTRAST:  198mL OMNIPAQUE IOHEXOL 350 MG/ML SOLN COMPARISON:  Portable chest earlier today. CT Abdomen and Pelvis 07/11/2014.  FINDINGS: CTA CHEST FINDINGS Cardiovascular: Excellent contrast bolus timing in the pulmonary arterial tree. No focal filling defect identified in the pulmonary arteries to suggest acute pulmonary embolism. Central pulmonary artery enlargement (series 7, image 164). Negative visible aorta aside from atherosclerosis. Calcified coronary artery atherosclerosis. Borderline cardiomegaly. No pericardial effusion. Mediastinum/Nodes: Negative. No lymphadenopathy. Lungs/Pleura: Lower lung volumes compared to the 2016 CT. Bilateral dependent atelectasis. Major airways remain patent but there are some airway secretions in the bronchus intermedius and other right side airways. No pleural effusion. No other abnormal pulmonary opacity. Musculoskeletal: No acute osseous abnormality identified. Review of the MIP images confirms the above findings. CT ABDOMEN and PELVIS FINDINGS Hepatobiliary: Surgically absent gallbladder. Negative liver. Pancreas: Negative. Spleen: Left pararenal stranding and fluid. There is an obstructing 8-9 millimeter calculus lodged at the left UPJ (coronal image 67). Delayed left renal nephrogram and contrast excretion. Distal to the stone the left ureter appears decompressed to the bladder. Adrenals/Urinary Tract: Normal adrenal glands. Right lower pole renal cyst has enlarged since 2016. But there is otherwise normal right renal enhancement and contrast excretion. Decompressed and negative right ureter. There is a small volume of gas within the urinary bladder on series 12, image 85. No perivesical stranding. Stomach/Bowel: Negative large bowel aside from redundancy and retained stool. Normal retrocecal appendix. Negative terminal ileum. No dilated small bowel. Negative stomach and duodenum. No free air, free fluid. Vascular/Lymphatic: Mild Aortoiliac calcified atherosclerosis. Major arterial structures are patent. Portal venous system is patent. No lymphadenopathy. Reproductive: Surgically absent. Other:  No pelvic free fluid. Musculoskeletal: Lumbar scoliosis, disc and endplate degeneration. No acute osseous abnormality identified. Review of the MIP images confirms the above findings. IMPRESSION: 1. Acute obstructive uropathy on the left with an 8-9 mm calculus lodged at the left UPJ. Suspicion of forniceal rupture. 2. Small volume of gas within the urinary bladder suspicious for UTI unless explained by recent catheterization. 3. No evidence of acute pulmonary embolus. Central pulmonary artery enlargement raising the possibility of pulmonary artery hypertension. 4. Low lung volumes with atelectasis. Trace retained secretions in the a right lung airways. 5. Calcified coronary artery atherosclerosis. Electronically Signed   By: Genevie Ann M.D.   On: 09/05/2018 23:44   Ct Abdomen Pelvis W Contrast  Result Date: 09/05/2018 CLINICAL DATA:  68 year old female with chest pain and shortness of breath. Upper abdominal pain. EXAM: CT ANGIOGRAPHY CHEST CT ABDOMEN AND PELVIS WITH CONTRAST TECHNIQUE: Multidetector CT imaging of the chest was performed using the standard protocol during bolus administration of intravenous contrast. Multiplanar CT image reconstructions and MIPs were obtained  to evaluate the vascular anatomy. Multidetector CT imaging of the abdomen and pelvis was performed using the standard protocol during bolus administration of intravenous contrast. CONTRAST:  130mL OMNIPAQUE IOHEXOL 350 MG/ML SOLN COMPARISON:  Portable chest earlier today. CT Abdomen and Pelvis 07/11/2014. FINDINGS: CTA CHEST FINDINGS Cardiovascular: Excellent contrast bolus timing in the pulmonary arterial tree. No focal filling defect identified in the pulmonary arteries to suggest acute pulmonary embolism. Central pulmonary artery enlargement (series 7, image 164). Negative visible aorta aside from atherosclerosis. Calcified coronary artery atherosclerosis. Borderline cardiomegaly. No pericardial effusion. Mediastinum/Nodes: Negative. No  lymphadenopathy. Lungs/Pleura: Lower lung volumes compared to the 2016 CT. Bilateral dependent atelectasis. Major airways remain patent but there are some airway secretions in the bronchus intermedius and other right side airways. No pleural effusion. No other abnormal pulmonary opacity. Musculoskeletal: No acute osseous abnormality identified. Review of the MIP images confirms the above findings. CT ABDOMEN and PELVIS FINDINGS Hepatobiliary: Surgically absent gallbladder. Negative liver. Pancreas: Negative. Spleen: Left pararenal stranding and fluid. There is an obstructing 8-9 millimeter calculus lodged at the left UPJ (coronal image 67). Delayed left renal nephrogram and contrast excretion. Distal to the stone the left ureter appears decompressed to the bladder. Adrenals/Urinary Tract: Normal adrenal glands. Right lower pole renal cyst has enlarged since 2016. But there is otherwise normal right renal enhancement and contrast excretion. Decompressed and negative right ureter. There is a small volume of gas within the urinary bladder on series 12, image 85. No perivesical stranding. Stomach/Bowel: Negative large bowel aside from redundancy and retained stool. Normal retrocecal appendix. Negative terminal ileum. No dilated small bowel. Negative stomach and duodenum. No free air, free fluid. Vascular/Lymphatic: Mild Aortoiliac calcified atherosclerosis. Major arterial structures are patent. Portal venous system is patent. No lymphadenopathy. Reproductive: Surgically absent. Other: No pelvic free fluid. Musculoskeletal: Lumbar scoliosis, disc and endplate degeneration. No acute osseous abnormality identified. Review of the MIP images confirms the above findings. IMPRESSION: 1. Acute obstructive uropathy on the left with an 8-9 mm calculus lodged at the left UPJ. Suspicion of forniceal rupture. 2. Small volume of gas within the urinary bladder suspicious for UTI unless explained by recent catheterization. 3. No  evidence of acute pulmonary embolus. Central pulmonary artery enlargement raising the possibility of pulmonary artery hypertension. 4. Low lung volumes with atelectasis. Trace retained secretions in the a right lung airways. 5. Calcified coronary artery atherosclerosis. Electronically Signed   By: Genevie Ann M.D.   On: 09/05/2018 23:44   Dg Chest Port 1 View  Result Date: 09/05/2018 CLINICAL DATA:  Shortness of breath. EXAM: PORTABLE CHEST 1 VIEW COMPARISON:  01/07/2018 FINDINGS: The cardiomediastinal contours are normal. Slightly low lung volumes. Subsegmental atelectasis at the left lung base. Pulmonary vasculature is normal. No consolidation, pleural effusion, or pneumothorax. No acute osseous abnormalities are seen. IMPRESSION: Low lung volumes with subsegmental left basilar atelectasis. Electronically Signed   By: Keith Rake M.D.   On: 09/05/2018 22:16   Dg C-arm 1-60 Min-no Report  Result Date: 09/06/2018 Fluoroscopy was utilized by the requesting physician.  No radiographic interpretation.    Procedures Procedures (including critical care time)  Medications Ordered in ED Medications  albuterol (PROVENTIL) (2.5 MG/3ML) 0.083% nebulizer solution 3 mL ( Inhalation MAR Unhold 09/06/18 1345)  rosuvastatin (CRESTOR) tablet 40 mg ( Oral MAR Unhold 09/06/18 1345)  sertraline (ZOLOFT) tablet 100 mg ( Oral MAR Unhold 09/06/18 1345)  ondansetron (ZOFRAN) tablet 4 mg ( Oral MAR Unhold 09/06/18 1345)  pantoprazole (PROTONIX) EC tablet 40 mg (  Oral MAR Unhold 09/06/18 1345)  gabapentin (NEURONTIN) capsule 400 mg ( Oral MAR Unhold 09/06/18 1345)  heparin injection 5,000 Units (5,000 Units Subcutaneous Not Given 09/06/18 1407)  acetaminophen (TYLENOL) tablet 650 mg ( Oral MAR Unhold 09/06/18 1345)    Or  acetaminophen (TYLENOL) suppository 650 mg ( Rectal MAR Unhold 09/06/18 1345)  HYDROcodone-acetaminophen (NORCO/VICODIN) 5-325 MG per tablet 1 tablet ( Oral MAR Unhold 09/06/18 1345)  polyethylene glycol  (MIRALAX / GLYCOLAX) packet 17 g ( Oral MAR Unhold 09/06/18 1345)  insulin aspart (novoLOG) injection 0-20 Units ( Subcutaneous MAR Unhold 09/06/18 1345)  0.9 %  sodium chloride infusion ( Intravenous Transfusing/Transfer 09/06/18 1355)  lactated ringers infusion ( Intravenous Anesthesia Volume Adjustment 09/06/18 1231)  cephALEXin (KEFLEX) capsule 500 mg (has no administration in time range)  oxybutynin (DITROPAN) tablet 5 mg (has no administration in time range)  sodium chloride 0.9 % bolus 500 mL ( Intravenous Stopped 09/05/18 2307)  ondansetron (ZOFRAN) injection 4 mg (4 mg Intravenous Given 09/05/18 2202)  morphine 4 MG/ML injection 4 mg (4 mg Intravenous Given 09/05/18 2244)  iohexol (OMNIPAQUE) 350 MG/ML injection 100 mL (100 mLs Intravenous Contrast Given 09/05/18 2313)  morphine 4 MG/ML injection 4 mg (4 mg Intravenous Given 09/05/18 2344)     Initial Impression / Assessment and Plan / ED Course  I have reviewed the triage vital signs and the nursing notes.  Pertinent labs & imaging results that were available during my care of the patient were reviewed by me and considered in my medical decision making (see chart for details).  Clinical Course as of Sep 06 1630  Wed Sep 05, 2018  2203 ECG is normal sinus rhythm rate of 63 normal intervals.  Diffuse T wave inversions similar to prior EKG 7/19.   [MB]  2330 Reexamined after breathing treatment and fluids.  She sounds much more clear.  Now she is having more upper abdominal tenderness.  Put her in for a CT abdomen and pelvis along with any NGO chest as she is got complaints all over the place.  Will likely need to be admitted with other comorbid medical history.   [MB]  2331 Influenza testing back and negative.   [MB]  2340 Gust with the internal medicine service physician who will evaluate her in the ED for possible admission.  The CT abdomen and pelvis along with a CT PE readings are still pending and this was explained to the admitting  team.   [MB]  2349 CTs coming back with no acute PE.  She does on her abdomen and pelvis show a 9 mm UPJ stone with possible forniceal rupture.  I placed a call into urology on-call.   [MB]  2353 Discussed with Dr. Diona Fanti from [facility] urology.  He feels that with a normal creatinine he would not intervene on this emergently tonight.  He will see the patient first thing in the morning.  He recommends waiting on the urinalysis and treat if signs of infection.  Otherwise recommends fluids and pain control.   [MB]    Clinical Course User Index [MB] Hayden Rasmussen, MD   ddx - flu, covid, copd exacerbation, pneumonia, intrabdominal process, pyelonephritis, acs  Docia Tennyson was evaluated in Emergency Department on 09/06/2018 for the symptoms described in the history of present illness. She was evaluated in the context of the global COVID-19 pandemic, which necessitated consideration that the patient might be at risk for infection with the SARS-CoV-2 virus that causes COVID-19. Institutional protocols and  algorithms that pertain to the evaluation of patients at risk for COVID-19 are in a state of rapid change based on information released by regulatory bodies including the CDC and federal and state organizations. These policies and algorithms were followed during the patient's care in the ED.      Final Clinical Impressions(s) / ED Diagnoses   Final diagnoses:  Shortness of breath  Nonspecific chest pain  Upper abdominal pain  Non-intractable vomiting with nausea, unspecified vomiting type  Ureterolithiasis    ED Discharge Orders    None       Hayden Rasmussen, MD 09/06/18 1635

## 2018-09-05 NOTE — ED Triage Notes (Signed)
Pt reports N/V going on for about 1 week. SOB beginning 1 day ago. Reports also new incontinence of urine, generalized body aches. Denies fevers, cough. Denies getting flu vaccine this year.

## 2018-09-05 NOTE — Telephone Encounter (Signed)
Thanks. Based on her shortness of breath at rest, and severe DOE and fatigue, I would want her evaluated in the ED tonight. May be hypoxic. She has several comorbidities that put her at high risk.

## 2018-09-05 NOTE — ED Notes (Signed)
Patient transported to CT 

## 2018-09-05 NOTE — Telephone Encounter (Signed)
Thank you,   I agree with plan
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
## 2018-09-05 NOTE — ED Notes (Signed)
Sort Nurse note, pt c/o congestion and shortness of breath. Mask placed. Pt Daughter, Tane Biegler is pt primary contact and can be reached at [phone]

## 2018-09-06 ENCOUNTER — Inpatient Hospital Stay (HOSPITAL_COMMUNITY): Payer: Medicare Other | Admitting: Anesthesiology

## 2018-09-06 ENCOUNTER — Encounter (HOSPITAL_COMMUNITY)
Admission: EM | Disposition: A | Discharge status: Home / Self Care | Payer: Self-pay | Source: Home / Self Care | Attending: Internal Medicine

## 2018-09-06 ENCOUNTER — Inpatient Hospital Stay (HOSPITAL_COMMUNITY): Payer: Medicare Other

## 2018-09-06 ENCOUNTER — Encounter (HOSPITAL_COMMUNITY): Payer: Self-pay | Admitting: Anesthesiology

## 2018-09-06 DIAGNOSIS — Z8673 Personal history of transient ischemic attack (TIA), and cerebral infarction without residual deficits: Secondary | ICD-10-CM

## 2018-09-06 DIAGNOSIS — Z960 Presence of urogenital implants: Secondary | ICD-10-CM

## 2018-09-06 DIAGNOSIS — E119 Type 2 diabetes mellitus without complications: Secondary | ICD-10-CM | POA: Diagnosis not present

## 2018-09-06 DIAGNOSIS — Z7984 Long term (current) use of oral hypoglycemic drugs: Secondary | ICD-10-CM | POA: Diagnosis not present

## 2018-09-06 DIAGNOSIS — I10 Essential (primary) hypertension: Secondary | ICD-10-CM

## 2018-09-06 DIAGNOSIS — Z79899 Other long term (current) drug therapy: Secondary | ICD-10-CM

## 2018-09-06 DIAGNOSIS — E1142 Type 2 diabetes mellitus with diabetic polyneuropathy: Secondary | ICD-10-CM | POA: Diagnosis not present

## 2018-09-06 DIAGNOSIS — Z884 Allergy status to anesthetic agent status: Secondary | ICD-10-CM | POA: Diagnosis not present

## 2018-09-06 DIAGNOSIS — J449 Chronic obstructive pulmonary disease, unspecified: Secondary | ICD-10-CM | POA: Diagnosis not present

## 2018-09-06 DIAGNOSIS — F329 Major depressive disorder, single episode, unspecified: Secondary | ICD-10-CM | POA: Diagnosis not present

## 2018-09-06 DIAGNOSIS — N200 Calculus of kidney: Secondary | ICD-10-CM

## 2018-09-06 DIAGNOSIS — I2510 Atherosclerotic heart disease of native coronary artery without angina pectoris: Secondary | ICD-10-CM | POA: Diagnosis present

## 2018-09-06 DIAGNOSIS — N136 Pyonephrosis: Secondary | ICD-10-CM | POA: Diagnosis not present

## 2018-09-06 DIAGNOSIS — I110 Hypertensive heart disease with heart failure: Secondary | ICD-10-CM | POA: Diagnosis not present

## 2018-09-06 DIAGNOSIS — F17210 Nicotine dependence, cigarettes, uncomplicated: Secondary | ICD-10-CM | POA: Diagnosis present

## 2018-09-06 DIAGNOSIS — Z96652 Presence of left artificial knee joint: Secondary | ICD-10-CM | POA: Diagnosis not present

## 2018-09-06 DIAGNOSIS — N139 Obstructive and reflux uropathy, unspecified: Secondary | ICD-10-CM | POA: Diagnosis not present

## 2018-09-06 DIAGNOSIS — E781 Pure hyperglyceridemia: Secondary | ICD-10-CM | POA: Diagnosis not present

## 2018-09-06 DIAGNOSIS — Z466 Encounter for fitting and adjustment of urinary device: Secondary | ICD-10-CM | POA: Diagnosis not present

## 2018-09-06 DIAGNOSIS — R0602 Shortness of breath: Secondary | ICD-10-CM | POA: Diagnosis not present

## 2018-09-06 DIAGNOSIS — I509 Heart failure, unspecified: Secondary | ICD-10-CM | POA: Diagnosis not present

## 2018-09-06 DIAGNOSIS — Z888 Allergy status to other drugs, medicaments and biological substances status: Secondary | ICD-10-CM | POA: Diagnosis not present

## 2018-09-06 DIAGNOSIS — R1032 Left lower quadrant pain: Secondary | ICD-10-CM | POA: Diagnosis not present

## 2018-09-06 DIAGNOSIS — N201 Calculus of ureter: Secondary | ICD-10-CM | POA: Diagnosis not present

## 2018-09-06 DIAGNOSIS — Z720 Tobacco use: Secondary | ICD-10-CM

## 2018-09-06 DIAGNOSIS — K219 Gastro-esophageal reflux disease without esophagitis: Secondary | ICD-10-CM | POA: Diagnosis not present

## 2018-09-06 DIAGNOSIS — N390 Urinary tract infection, site not specified: Secondary | ICD-10-CM

## 2018-09-06 DIAGNOSIS — N39498 Other specified urinary incontinence: Secondary | ICD-10-CM | POA: Diagnosis present

## 2018-09-06 DIAGNOSIS — Z8349 Family history of other endocrine, nutritional and metabolic diseases: Secondary | ICD-10-CM | POA: Diagnosis not present

## 2018-09-06 DIAGNOSIS — Z8249 Family history of ischemic heart disease and other diseases of the circulatory system: Secondary | ICD-10-CM | POA: Diagnosis not present

## 2018-09-06 HISTORY — PX: CYSTOSCOPY W/ URETERAL STENT PLACEMENT: SHX1429

## 2018-09-06 LAB — GLUCOSE, CAPILLARY
GLUCOSE-CAPILLARY: 110 mg/dL — AB (ref 70–99)
GLUCOSE-CAPILLARY: 324 mg/dL — AB (ref 70–99)
Glucose-Capillary: 100 mg/dL — ABNORMAL HIGH (ref 70–99)
Glucose-Capillary: 106 mg/dL — ABNORMAL HIGH (ref 70–99)
Glucose-Capillary: 261 mg/dL — ABNORMAL HIGH (ref 70–99)
Glucose-Capillary: 317 mg/dL — ABNORMAL HIGH (ref 70–99)
Glucose-Capillary: 82 mg/dL (ref 70–99)
Glucose-Capillary: 93 mg/dL (ref 70–99)

## 2018-09-06 LAB — RESPIRATORY PANEL BY PCR
Adenovirus: NOT DETECTED
BORDETELLA PERTUSSIS-RVPCR: NOT DETECTED
Chlamydophila pneumoniae: NOT DETECTED
Coronavirus 229E: NOT DETECTED
Coronavirus HKU1: NOT DETECTED
Coronavirus NL63: NOT DETECTED
Coronavirus OC43: NOT DETECTED
INFLUENZA A-RVPPCR: NOT DETECTED
Influenza B: NOT DETECTED
Metapneumovirus: NOT DETECTED
Mycoplasma pneumoniae: NOT DETECTED
Parainfluenza Virus 1: NOT DETECTED
Parainfluenza Virus 2: NOT DETECTED
Parainfluenza Virus 3: NOT DETECTED
Parainfluenza Virus 4: NOT DETECTED
Respiratory Syncytial Virus: NOT DETECTED
Rhinovirus / Enterovirus: NOT DETECTED

## 2018-09-06 LAB — CBC
HCT: 38.8 % (ref 36.0–46.0)
HCT: 36.3 % (ref 36.0–46.0)
Hemoglobin: 11.8 g/dL — ABNORMAL LOW (ref 12.0–15.0)
Hemoglobin: 11.6 g/dL — ABNORMAL LOW (ref 12.0–15.0)
MCH: 30.2 pg (ref 26.0–34.0)
MCH: 30.9 pg (ref 26.0–34.0)
MCHC: 30.4 g/dL (ref 30.0–36.0)
MCHC: 32 g/dL (ref 30.0–36.0)
MCV: 99.2 fL (ref 80.0–100.0)
MCV: 96.8 fL (ref 80.0–100.0)
PLATELETS: 127 10*3/uL — AB (ref 150–400)
Platelets: 132 10*3/uL — ABNORMAL LOW (ref 150–400)
RBC: 3.91 MIL/uL (ref 3.87–5.11)
RBC: 3.75 MIL/uL — ABNORMAL LOW (ref 3.87–5.11)
RDW: 15 % (ref 11.5–15.5)
RDW: 15 % (ref 11.5–15.5)
WBC: 11.9 10*3/uL — ABNORMAL HIGH (ref 4.0–10.5)
WBC: 10.8 10*3/uL — ABNORMAL HIGH (ref 4.0–10.5)
nRBC: 0 % (ref 0.0–0.2)
nRBC: 0 % (ref 0.0–0.2)

## 2018-09-06 LAB — BASIC METABOLIC PANEL
Anion gap: 5 (ref 5–15)
BUN: 14 mg/dL (ref 8–23)
CALCIUM: 8.8 mg/dL — AB (ref 8.9–10.3)
CO2: 22 mmol/L (ref 22–32)
CREATININE: 1.27 mg/dL — AB (ref 0.44–1.00)
Chloride: 110 mmol/L (ref 98–111)
GFR calc Af Amer: 51 mL/min — ABNORMAL LOW (ref >60)
GFR calc non Af Amer: 44 mL/min — ABNORMAL LOW (ref >60)
Glucose, Bld: 128 mg/dL — ABNORMAL HIGH (ref 70–99)
Potassium: 4.2 mmol/L (ref 3.5–5.1)
Sodium: 137 mmol/L (ref 135–145)

## 2018-09-06 LAB — URINALYSIS, MICROSCOPIC (REFLEX)

## 2018-09-06 LAB — URINALYSIS, ROUTINE W REFLEX MICROSCOPIC
Bilirubin Urine: NEGATIVE
Glucose, UA: NEGATIVE mg/dL
Ketones, ur: NEGATIVE mg/dL
Leukocytes,Ua: NEGATIVE
Nitrite: POSITIVE — AB
Protein, ur: 30 mg/dL — AB
Specific Gravity, Urine: 1.01 (ref 1.005–1.030)
pH: 5.5 (ref 5.0–8.0)

## 2018-09-06 LAB — HIV ANTIBODY (ROUTINE TESTING W REFLEX): HIV Screen 4th Generation wRfx: NONREACTIVE

## 2018-09-06 LAB — CREATININE, SERUM
CREATININE: 1.19 mg/dL — AB (ref 0.44–1.00)
GFR calc non Af Amer: 48 mL/min — ABNORMAL LOW (ref >60)
GFR, EST AFRICAN AMERICAN: 55 mL/min — AB (ref >60)

## 2018-09-06 LAB — CBG MONITORING, ED: GLUCOSE-CAPILLARY: 164 mg/dL — AB (ref 70–99)

## 2018-09-06 SURGERY — CYSTOSCOPY, WITH RETROGRADE PYELOGRAM AND URETERAL STENT INSERTION
Anesthesia: General | Laterality: Left

## 2018-09-06 MED ORDER — MIDAZOLAM HCL 2 MG/2ML IJ SOLN
INTRAMUSCULAR | Status: AC
  Filled 2018-09-06: qty 2

## 2018-09-06 MED ORDER — PANTOPRAZOLE SODIUM 40 MG PO TBEC
40.0000 mg | DELAYED_RELEASE_TABLET | Freq: Two times a day (BID) | ORAL | Status: DC
  Administered 2018-09-06 – 2018-09-07 (×3): 40 mg via ORAL
  Filled 2018-09-06 (×3): qty 1

## 2018-09-06 MED ORDER — PHENYLEPHRINE 40 MCG/ML (10ML) SYRINGE FOR IV PUSH (FOR BLOOD PRESSURE SUPPORT)
PREFILLED_SYRINGE | INTRAVENOUS | Status: AC
  Filled 2018-09-06: qty 10

## 2018-09-06 MED ORDER — LIDOCAINE 2% (20 MG/ML) 5 ML SYRINGE
INTRAMUSCULAR | Status: DC | PRN
  Administered 2018-09-06: 60 mg via INTRAVENOUS

## 2018-09-06 MED ORDER — LACTATED RINGERS IV SOLN
INTRAVENOUS | Status: DC
  Administered 2018-09-06 – 2018-09-07 (×2): via INTRAVENOUS

## 2018-09-06 MED ORDER — ACETAMINOPHEN 325 MG PO TABS
650.0000 mg | ORAL_TABLET | Freq: Four times a day (QID) | ORAL | Status: DC | PRN
  Administered 2018-09-06 – 2018-09-07 (×2): 650 mg via ORAL
  Filled 2018-09-06 (×2): qty 2

## 2018-09-06 MED ORDER — SUCCINYLCHOLINE CHLORIDE 20 MG/ML IJ SOLN
INTRAMUSCULAR | Status: DC | PRN
  Administered 2018-09-06: 80 mg via INTRAVENOUS

## 2018-09-06 MED ORDER — ONDANSETRON HCL 4 MG/2ML IJ SOLN
INTRAMUSCULAR | Status: AC
  Filled 2018-09-06: qty 2

## 2018-09-06 MED ORDER — OXYBUTYNIN CHLORIDE 5 MG PO TABS: 5.0000 mg | ORAL_TABLET | Freq: Three times a day (TID) | ORAL | Status: DC | PRN

## 2018-09-06 MED ORDER — PROPOFOL 10 MG/ML IV BOLUS
INTRAVENOUS | Status: DC | PRN
  Administered 2018-09-06: 200 mg via INTRAVENOUS

## 2018-09-06 MED ORDER — ONDANSETRON HCL 4 MG/2ML IJ SOLN
INTRAMUSCULAR | Status: DC | PRN
  Administered 2018-09-06: 4 mg via INTRAVENOUS

## 2018-09-06 MED ORDER — FENTANYL CITRATE (PF) 250 MCG/5ML IJ SOLN
INTRAMUSCULAR | Status: AC
  Filled 2018-09-06: qty 5

## 2018-09-06 MED ORDER — HYDROCODONE-ACETAMINOPHEN 5-325 MG PO TABS
1.0000 | ORAL_TABLET | ORAL | Status: DC | PRN
  Administered 2018-09-06 – 2018-09-07 (×4): 1 via ORAL
  Filled 2018-09-06 (×4): qty 1

## 2018-09-06 MED ORDER — PROPOFOL 10 MG/ML IV BOLUS
INTRAVENOUS | Status: AC
  Filled 2018-09-06: qty 40

## 2018-09-06 MED ORDER — ACETAMINOPHEN 650 MG RE SUPP: 650.0000 mg | Freq: Four times a day (QID) | RECTAL | Status: DC | PRN

## 2018-09-06 MED ORDER — LIDOCAINE 2% (20 MG/ML) 5 ML SYRINGE
INTRAMUSCULAR | Status: AC
  Filled 2018-09-06: qty 5

## 2018-09-06 MED ORDER — INSULIN ASPART 100 UNIT/ML ~~LOC~~ SOLN
0.0000 [IU] | SUBCUTANEOUS | Status: DC
  Administered 2018-09-06: 15 [IU] via SUBCUTANEOUS
  Administered 2018-09-06: 11 [IU] via SUBCUTANEOUS
  Administered 2018-09-06: 4 [IU] via SUBCUTANEOUS
  Administered 2018-09-07: 15 [IU] via SUBCUTANEOUS
  Administered 2018-09-07: 4 [IU] via SUBCUTANEOUS
  Administered 2018-09-07: 11 [IU] via SUBCUTANEOUS
  Administered 2018-09-07: 7 [IU] via SUBCUTANEOUS

## 2018-09-06 MED ORDER — ONDANSETRON HCL 4 MG PO TABS: 4.0000 mg | ORAL_TABLET | Freq: Four times a day (QID) | ORAL | Status: DC | PRN

## 2018-09-06 MED ORDER — PHENYLEPHRINE HCL 10 MG/ML IJ SOLN
INTRAMUSCULAR | Status: DC | PRN
  Administered 2018-09-06: 40 ug via INTRAVENOUS

## 2018-09-06 MED ORDER — IOHEXOL 300 MG/ML  SOLN
INTRAMUSCULAR | Status: DC | PRN
  Administered 2018-09-06: 50 mL via INTRAVENOUS

## 2018-09-06 MED ORDER — ROSUVASTATIN CALCIUM 20 MG PO TABS
40.0000 mg | ORAL_TABLET | Freq: Every day | ORAL | Status: DC
  Administered 2018-09-06 – 2018-09-07 (×2): 40 mg via ORAL
  Filled 2018-09-06 (×2): qty 2

## 2018-09-06 MED ORDER — POLYETHYLENE GLYCOL 3350 17 G PO PACK: 17.0000 g | PACK | Freq: Every day | ORAL | Status: DC | PRN

## 2018-09-06 MED ORDER — DEXAMETHASONE SODIUM PHOSPHATE 10 MG/ML IJ SOLN
INTRAMUSCULAR | Status: DC | PRN
  Administered 2018-09-06: 5 mg via INTRAVENOUS

## 2018-09-06 MED ORDER — SUCCINYLCHOLINE CHLORIDE 200 MG/10ML IV SOSY
PREFILLED_SYRINGE | INTRAVENOUS | Status: AC
  Filled 2018-09-06: qty 10

## 2018-09-06 MED ORDER — SODIUM CHLORIDE 0.9 % IV SOLN
1.0000 g | Freq: Every day | INTRAVENOUS | Status: DC
  Administered 2018-09-06: 1 g via INTRAVENOUS
  Filled 2018-09-06: qty 10

## 2018-09-06 MED ORDER — SODIUM CHLORIDE 0.9 % IV SOLN
1.0000 g | INTRAVENOUS | Status: DC
  Filled 2018-09-06: qty 10

## 2018-09-06 MED ORDER — SERTRALINE HCL 100 MG PO TABS
100.0000 mg | ORAL_TABLET | Freq: Every day | ORAL | Status: DC
  Administered 2018-09-07: 100 mg via ORAL
  Filled 2018-09-06: qty 1

## 2018-09-06 MED ORDER — EPHEDRINE SULFATE 50 MG/ML IJ SOLN
INTRAMUSCULAR | Status: DC | PRN
  Administered 2018-09-06: 10 mg via INTRAVENOUS

## 2018-09-06 MED ORDER — HEPARIN SODIUM (PORCINE) 5000 UNIT/ML IJ SOLN
5000.0000 [IU] | Freq: Three times a day (TID) | INTRAMUSCULAR | Status: DC
  Administered 2018-09-06 – 2018-09-07 (×2): 5000 [IU] via SUBCUTANEOUS
  Filled 2018-09-06 (×2): qty 1

## 2018-09-06 MED ORDER — SODIUM CHLORIDE 0.9 % IV SOLN
INTRAVENOUS | Status: AC
  Administered 2018-09-06: 03:00:00 via INTRAVENOUS

## 2018-09-06 MED ORDER — FENTANYL CITRATE (PF) 100 MCG/2ML IJ SOLN
INTRAMUSCULAR | Status: DC | PRN
  Administered 2018-09-06: 150 ug via INTRAVENOUS

## 2018-09-06 MED ORDER — GABAPENTIN 400 MG PO CAPS
400.0000 mg | ORAL_CAPSULE | Freq: Three times a day (TID) | ORAL | Status: DC
  Administered 2018-09-06 – 2018-09-07 (×4): 400 mg via ORAL
  Filled 2018-09-06 (×4): qty 1

## 2018-09-06 MED ORDER — CEPHALEXIN 500 MG PO CAPS
500.0000 mg | ORAL_CAPSULE | Freq: Two times a day (BID) | ORAL | Status: DC
  Administered 2018-09-07: 500 mg via ORAL
  Filled 2018-09-06: qty 1

## 2018-09-06 MED ORDER — ALBUTEROL SULFATE (2.5 MG/3ML) 0.083% IN NEBU: 3.0000 mL | INHALATION_SOLUTION | RESPIRATORY_TRACT | Status: DC | PRN

## 2018-09-06 MED ORDER — EPHEDRINE 5 MG/ML INJ
INTRAVENOUS | Status: AC
  Filled 2018-09-06: qty 10

## 2018-09-06 MED ORDER — DEXAMETHASONE SODIUM PHOSPHATE 10 MG/ML IJ SOLN
INTRAMUSCULAR | Status: AC
  Filled 2018-09-06: qty 1

## 2018-09-06 SURGICAL SUPPLY — 35 items
BAG DRAIN URO-CYSTO SKYTR STRL (DRAIN) IMPLANT
BASKET LASER NITINOL 1.9FR (BASKET) IMPLANT
BASKET STNLS GEMINI 4WIRE 3FR (BASKET) IMPLANT
BASKET ZERO TIP NITINOL 2.4FR (BASKET) IMPLANT
CATH INTERMIT  6FR 70CM (CATHETERS) ×2 IMPLANT
CATH URET 5FR 28IN CONE TIP (BALLOONS)
CATH URET 5FR 28IN OPEN ENDED (CATHETERS) IMPLANT
CATH URET 5FR 70CM CONE TIP (BALLOONS) IMPLANT
CATH URET DUAL LUMEN 6-10FR 50 (CATHETERS) ×2 IMPLANT
COVER WAND RF STERILE (DRAPES) ×2 IMPLANT
ELECT REM PT RETURN 9FT ADLT (ELECTROSURGICAL)
ELECTRODE REM PT RTRN 9FT ADLT (ELECTROSURGICAL) IMPLANT
GLOVE BIO SURGEON STRL SZ7.5 (GLOVE) ×2 IMPLANT
GOWN STRL REUS W/ TWL LRG LVL3 (GOWN DISPOSABLE) ×1 IMPLANT
GOWN STRL REUS W/ TWL XL LVL3 (GOWN DISPOSABLE) ×2 IMPLANT
GOWN STRL REUS W/TWL LRG LVL3 (GOWN DISPOSABLE) ×1
GOWN STRL REUS W/TWL XL LVL3 (GOWN DISPOSABLE) ×2
GUIDEWIRE ANG ZIPWIRE 038X150 (WIRE) IMPLANT
GUIDEWIRE NITINOL THRD TIP (WIRE) ×2 IMPLANT
GUIDEWIRE STR DUAL SENSOR (WIRE) IMPLANT
INFUSOR MANOMETER BAG 3000ML (MISCELLANEOUS) ×4 IMPLANT
IV NS IRRIG 3000ML ARTHROMATIC (IV SOLUTION) ×2 IMPLANT
KIT BALLN UROMAX 15FX4 (MISCELLANEOUS) ×1 IMPLANT
KIT BALLN UROMAX 26 75X4 (MISCELLANEOUS) ×1
KIT TURNOVER KIT B (KITS) ×2 IMPLANT
MANIFOLD NEPTUNE II (INSTRUMENTS) ×2 IMPLANT
NS IRRIG 1000ML POUR BTL (IV SOLUTION) ×2 IMPLANT
PACK CYSTO (CUSTOM PROCEDURE TRAY) ×2 IMPLANT
SET IRRIG Y TYPE TUR BLADDER L (SET/KITS/TRAYS/PACK) IMPLANT
SHEATH URETERAL 12FRX35CM (MISCELLANEOUS) ×2 IMPLANT
SOL PREP POV-IOD 4OZ 10% (MISCELLANEOUS) ×2 IMPLANT
STENT URETERAL METAL 6X24 (STENTS) ×2 IMPLANT
SYRINGE IRR TOOMEY STRL 70CC (SYRINGE) ×2 IMPLANT
TUBE CONNECTING 12X1/4 (SUCTIONS) IMPLANT
WATER STERILE IRR 3000ML UROMA (IV SOLUTION) ×2 IMPLANT

## 2018-09-06 NOTE — Anesthesia Preprocedure Evaluation (Addendum)
Anesthesia Evaluation  Patient identified by MRN, date of birth, ID band Patient awake    Reviewed: Allergy & Precautions, NPO status , Patient's Chart, lab work & pertinent test results  History of Anesthesia Complications Negative for: history of anesthetic complications  Airway Mallampati: II  TM Distance: >3 FB Neck ROM: Full    Dental  (+) Dental Advisory Given   Pulmonary COPD,  COPD inhaler, Recent URI  (afebrile), Residual Cough, Current Smoker,    breath sounds clear to auscultation       Cardiovascular hypertension, (-) angina Rhythm:Regular Rate:Normal  '19 ECHO: EF 65-70%, valves OK   Neuro/Psych Depression TIA   GI/Hepatic Neg liver ROS, GERD  Medicated and Poorly Controlled,  Endo/Other  diabetes (glu 93), Oral Hypoglycemic Agents  Renal/GU Renal InsufficiencyRenal disease     Musculoskeletal   Abdominal   Peds  Hematology negative hematology ROS (+)   Anesthesia Other Findings   Reproductive/Obstetrics                           Anesthesia Physical Anesthesia Plan  ASA: III  Anesthesia Plan: General   Post-op Pain Management:    Induction: Intravenous and Rapid sequence  PONV Risk Score and Plan: 2 and Ondansetron and Dexamethasone  Airway Management Planned: Oral ETT  Additional Equipment:   Intra-op Plan:   Post-operative Plan: Extubation in OR  Informed Consent: I have reviewed the patients History and Physical, chart, labs and discussed the procedure including the risks, benefits and alternatives for the proposed anesthesia with the patient or authorized representative who has indicated his/her understanding and acceptance.     Dental advisory given  Plan Discussed with: CRNA and Surgeon  Anesthesia Plan Comments:         Anesthesia Quick Evaluation

## 2018-09-06 NOTE — Transfer of Care (Signed)
Immediate Anesthesia Transfer of Care Note  Patient: [name]  Procedure(s) Performed: CYSTOSCOPY WITH LEFT RETROGRADE PYELOGRAM/URETERAL LEFT DOUBLE J STENT PLACEMENT (Left )  Patient Location: PACU  Anesthesia Type:General  Level of Consciousness: awake, alert , oriented and sedated  Airway & Oxygen Therapy: Patient Spontanous Breathing and Patient connected to face mask oxygen  Post-op Assessment: Report given to RN, Post -op Vital signs reviewed and stable and Patient moving all extremities  Post vital signs: Reviewed and stable  Last Vitals:  Vitals Value Taken Time  BP 119/93 09/06/2018 12:23 PM  Temp    Pulse 95 09/06/2018 12:31 PM  Resp 17 09/06/2018 12:31 PM  SpO2 93 % 09/06/2018 12:31 PM  Vitals shown include unvalidated device data.  Last Pain:  Vitals:   09/06/18 1017  TempSrc: Oral  PainSc:       Patients Stated Pain Goal: 2 (00/51/10 2111)  Complications: No apparent anesthesia complications

## 2018-09-06 NOTE — Anesthesia Postprocedure Evaluation (Signed)
Anesthesia Post Note  Patient: Bryann Kneebone  Procedure(s) Performed: CYSTOSCOPY WITH LEFT RETROGRADE PYELOGRAM/URETERAL LEFT DOUBLE J STENT PLACEMENT (Left )     Patient location during evaluation: Endoscopy Anesthesia Type: General Level of consciousness: awake and alert, oriented and patient cooperative Pain management: pain level controlled Vital Signs Assessment: post-procedure vital signs reviewed and stable Respiratory status: spontaneous breathing, nonlabored ventilation and respiratory function stable Cardiovascular status: blood pressure returned to baseline and stable Postop Assessment: no apparent nausea or vomiting Anesthetic complications: no    Last Vitals:  Vitals:   09/06/18 1305 09/06/18 1339  BP: 122/70 103/66  Pulse: 79 77  Resp: 19 18  Temp: (!) 36.1 C 37 C  SpO2: 97% 94%    Last Pain:  Vitals:   09/06/18 1339  TempSrc: Oral  PainSc:                  Roberts Bon,E. Kateryna Grantham

## 2018-09-06 NOTE — Discharge Instructions (Signed)

## 2018-09-06 NOTE — ED Notes (Signed)
ED TO INPATIENT HANDOFF REPORT  ED Nurse Name and Phone #:  Freida Busman  [phone]  S Name/Age/Gender [name] 68 y.o. female Room/Bed: 032C/032C  Code Status   Code Status: Partial Code  Home/SNF/Other Home Patient oriented to: self, place, time and situation Is this baseline? Yes   Triage Complete: Triage complete  Chief Complaint Cough, SHOB, body pain  Triage Note Pt reports N/V going on for about 1 week. SOB beginning 1 day ago. Reports also new incontinence of urine, generalized body aches. Denies fevers, cough. Denies getting flu vaccine this year.    Allergies Allergies  Allergen Reactions  . Anesthesia S-I-60 Other (See Comments)    Hallucinations.  Yvette Rack [Cyclobenzaprine] Other (See Comments)    Prolonged QTc to 571, tachycardia  . Soma [Carisoprodol] Itching and Rash    Level of Care/Admitting Diagnosis ED Disposition    ED Disposition Condition [facility] Area: [place] [[id number]]  Level of Care: Med-Surg [16]  Diagnosis: Nephrolithiasis [[id number]]  Admitting Physician: Aldine Contes [[id number]]  Attending Physician: Aldine Contes [phone]  Estimated length of stay: past midnight tomorrow  Certification:: I certify this patient will need inpatient services for at least 2 midnights  PT Class (Do Not Modify): Inpatient [101]  PT Acc Code (Do Not Modify): Private [1]       B Medical/Surgery History Past Medical History:  Diagnosis Date  . Allergy   . Anemia   . Arthritis    "left knee" (09/08/2015)  . Asthma   . Blood transfusion without reported diagnosis 10/2017   anemic  . Brachial plexus disorders   . CHF (congestive heart failure) ([place])   . Chronic pain    neck pain, headache, neuropathy  . COPD (chronic obstructive pulmonary disease) ([place])    SEES ONLY DR. PATEL   . Depression    "when my husband passed in 2013"  . Facial trauma 05/2018  . Fall 04/19/2018  . Gastric ulcer   . GERD  (gastroesophageal reflux disease)   . Hypertension   . Hypertriglyceridemia   . Migraine    "monthly" (09/08/2015)  . Neuromuscular disorder (HCC)    neuropathy  . Puncture wound of foot, right 05/17/2012   Tetanus shot 3 yrs ago at [facility] in [place], per pt report   . Stroke ([place])    TIAS    IN [place]   4 YRS AGO   . Type II diabetes mellitus ([place]) 2010   diagnosed around 2010, only ever on metformin   Past Surgical History:  Procedure Laterality Date  . 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 ARTHRODESIS METATARSAL     RIGHT 5 TH   . BILATERAL SALPINGOOPHORECTOMY Bilateral 1986   "after hemtoma evacuations; had to cut me open"  . BIOPSY  11/07/2017   Procedure: BIOPSY;  Surgeon: Irene Shipper, MD;  Location: [facility] ENDOSCOPY;  Service: Endoscopy;;  . CARPAL TUNNEL RELEASE Bilateral   . COLONOSCOPY     10 + yrs ago- pt unsure- was in [place]- pt states  MD will not send records but colon was normal per pt.   Marland Kitchen DILATION AND CURETTAGE OF UTERUS  ~ 1982   S/P miscarriage  . ESOPHAGOGASTRODUODENOSCOPY (EGD) WITH PROPOFOL N/A 11/07/2017   Procedure: ESOPHAGOGASTRODUODENOSCOPY (EGD) WITH PROPOFOL;  Surgeon: Irene Shipper, MD;  Location: [place];  Service: Endoscopy;  Laterality: N/A;  . HEMATOMA EVACUATION Right 1986 x 2   "OVARY; w/in 1 wk after hysterectomy"  . KNEE ARTHROSCOPY     LEFT  . LAPAROSCOPIC CHOLECYSTECTOMY    . SHOULDER ARTHROSCOPY W/ ROTATOR CUFF REPAIR Bilateral   . TONSILLECTOMY    . TOTAL KNEE ARTHROPLASTY Left 09/30/2016   Procedure: LEFT TOTAL KNEE ARTHROPLASTY;  Surgeon: Netta Cedars, MD;  Location: [place];  Service: Orthopedics;  Laterality: Left;  . TUBAL LIGATION    . VAGINAL HYSTERECTOMY  1986     A IV Location/Drains/Wounds Patient Lines/Drains/Airways Status   Active Line/Drains/Airways    Name:   Placement date:    Placement time:   Site:   Days:   Peripheral IV 09/05/18 Left Antecubital   09/05/18    2151    Antecubital   1   External Urinary Catheter   09/05/18    2200    -   1   Incision (Closed) 09/30/16 Knee Left   09/30/16    1403     706   Incision (Closed) 11/27/17 Toe (Comment  which one) Right   11/27/17    1552     283   Wound / Incision (Open or Dehisced) 05/25/15 Incision - Open Foot Right infected diabetic foot ulcer where right great toe amputated 04/2015   05/25/15    -    Foot   1200   Wound / Incision (Open or Dehisced) 09/09/15 Other (Comment) Head Posterior purple   09/09/15    1605    Head   1093   Wound / Incision (Open or Dehisced) 06/05/17 Non-pressure wound  Right   06/05/17    2318    -   458   Wound / Incision (Open or Dehisced) 11/27/17 Diabetic ulcer Foot Right;Lateral healed over   11/27/17    1554    Foot   283          Intake/Output Last 24 hours  Intake/Output Summary (Last 24 hours) at 09/06/2018 0047 Last data filed at 09/05/2018 2334 Gross per 24 hour  Intake 1000 ml  Output -  Net 1000 ml    Labs/Imaging Results for orders placed or performed during the hospital encounter of 09/05/18 (from the past 48 hour(s))  CBC with Differential/Platelet     Status: Abnormal   Collection Time: 09/05/18  9:50 PM  Result Value Ref Range   WBC 11.4 (H) 4.0 - 10.5 K/uL   RBC 4.56 3.87 - 5.11 MIL/uL   Hemoglobin 13.7 12.0 - 15.0 g/dL   HCT 44.2 36.0 - 46.0 %   MCV 96.9 80.0 - 100.0 fL   MCH 30.0 26.0 - 34.0 pg   MCHC 31.0 30.0 - 36.0 g/dL   RDW 14.7 11.5 - 15.5 %   Platelets 166 150 - 400 K/uL   nRBC 0.2 0.0 - 0.2 %   Neutrophils Relative % 54 %   Neutro Abs 6.2 1.7 - 7.7 K/uL   Lymphocytes Relative 23 %   Lymphs Abs 2.6 0.7 - 4.0 K/uL   Monocytes Relative 8 %   Monocytes Absolute 0.9 0.1 - 1.0 K/uL   Eosinophils Relative 13 %   Eosinophils Absolute 1.5 (H) 0.0 - 0.5 K/uL   Basophils Relative 1 %   Basophils Absolute 0.1 0.0 - 0.1 K/uL   Immature Granulocytes 1  %   Abs Immature Granulocytes 0.07 0.00 - 0.07 K/uL    Comment: Performed at [facility] [place] [facility]  Lab, [address] [place], [place] [address]  Comprehensive metabolic panel     Status: Abnormal   Collection Time: 09/05/18  9:50 PM  Result Value Ref Range   Sodium 140 135 - 145 mmol/L   Potassium 3.7 3.5 - 5.1 mmol/L   Chloride 107 98 - 111 mmol/L   CO2 23 22 - 32 mmol/L   Glucose, Bld 140 (H) 70 - 99 mg/dL   BUN 12 8 - 23 mg/dL   Creatinine, Ser 1.06 (H) 0.44 - 1.00 mg/dL   Calcium 9.8 8.9 - 10.3 mg/dL   Total Protein 7.5 6.5 - 8.1 g/dL   Albumin 4.2 3.5 - 5.0 g/dL   AST 45 (H) 15 - 41 U/L   ALT 65 (H) 0 - 44 U/L   Alkaline Phosphatase 69 38 - 126 U/L   Total Bilirubin 0.6 0.3 - 1.2 mg/dL   GFR calc non Af Amer 55 (L) >60 mL/min   GFR calc Af Amer >60 >60 mL/min   Anion gap 10 5 - 15    Comment: Performed at [facility] [address] [place], [place] [address]  Brain natriuretic peptide     Status: Abnormal   Collection Time: 09/05/18  9:50 PM  Result Value Ref Range   B Natriuretic Peptide 176.1 (H) 0.0 - 100.0 pg/mL    Comment: Performed at [facility] [address] [place], [place] [address]  Troponin I - Once     Status: None   Collection Time: 09/05/18 10:04 PM  Result Value Ref Range   Troponin I <0.03 <0.03 ng/mL    Comment: Performed at [facility] [address] [place], [place] [address]  Influenza panel by PCR (type A & B)     Status: None   Collection Time: 09/05/18 10:09 PM  Result Value Ref Range   Influenza A By PCR NEGATIVE NEGATIVE   Influenza B By PCR NEGATIVE NEGATIVE    Comment: (NOTE) The Xpert Xpress Flu assay is intended as an aid in the diagnosis of  influenza and should not be used as a sole basis for treatment.  This  assay is FDA approved for nasopharyngeal swab specimens only. Nasal  washings and aspirates are unacceptable for Xpert Xpress Flu testing. Performed at [facility], [place] [address] [place],  [place] [address]    Ct Angio Chest Pe W/cm &/or Wo Cm  Result Date: 09/05/2018 CLINICAL DATA:  68 year old female with chest pain and shortness of breath. Upper abdominal pain. EXAM: CT ANGIOGRAPHY CHEST CT ABDOMEN AND PELVIS WITH CONTRAST TECHNIQUE: Multidetector CT imaging of the chest was performed using the standard protocol during bolus administration of intravenous contrast. Multiplanar CT image reconstructions and MIPs were obtained to evaluate the vascular anatomy. Multidetector CT imaging of the abdomen and pelvis was performed using the standard protocol during bolus administration of intravenous contrast. CONTRAST:  161mL OMNIPAQUE IOHEXOL 350 MG/ML SOLN COMPARISON:  Portable chest earlier today. CT Abdomen and Pelvis 07/11/2014. FINDINGS: CTA CHEST FINDINGS Cardiovascular: Excellent contrast bolus timing in the pulmonary arterial tree. No focal filling defect identified in the pulmonary arteries to suggest acute pulmonary embolism. Central pulmonary artery enlargement (series 7, image 164). Negative visible aorta aside from atherosclerosis. Calcified coronary artery atherosclerosis. Borderline cardiomegaly. No pericardial effusion. Mediastinum/Nodes: Negative. No lymphadenopathy. Lungs/Pleura: Lower lung volumes compared to the 2016 CT. Bilateral dependent atelectasis. Major airways remain patent but there are some airway secretions in the bronchus intermedius  and other right side airways. No pleural effusion. No other abnormal pulmonary opacity. Musculoskeletal: No acute osseous abnormality identified. Review of the MIP images confirms the above findings. CT ABDOMEN and PELVIS FINDINGS Hepatobiliary: Surgically absent gallbladder. Negative liver. Pancreas: Negative. Spleen: Left pararenal stranding and fluid. There is an obstructing 8-9 millimeter calculus lodged at the left UPJ (coronal image 67). Delayed left renal nephrogram and contrast excretion. Distal to the stone the left ureter appears  decompressed to the bladder. Adrenals/Urinary Tract: Normal adrenal glands. Right lower pole renal cyst has enlarged since 2016. But there is otherwise normal right renal enhancement and contrast excretion. Decompressed and negative right ureter. There is a small volume of gas within the urinary bladder on series 12, image 85. No perivesical stranding. Stomach/Bowel: Negative large bowel aside from redundancy and retained stool. Normal retrocecal appendix. Negative terminal ileum. No dilated small bowel. Negative stomach and duodenum. No free air, free fluid. Vascular/Lymphatic: Mild Aortoiliac calcified atherosclerosis. Major arterial structures are patent. Portal venous system is patent. No lymphadenopathy. Reproductive: Surgically absent. Other: No pelvic free fluid. Musculoskeletal: Lumbar scoliosis, disc and endplate degeneration. No acute osseous abnormality identified. Review of the MIP images confirms the above findings. IMPRESSION: 1. Acute obstructive uropathy on the left with an 8-9 mm calculus lodged at the left UPJ. Suspicion of forniceal rupture. 2. Small volume of gas within the urinary bladder suspicious for UTI unless explained by recent catheterization. 3. No evidence of acute pulmonary embolus. Central pulmonary artery enlargement raising the possibility of pulmonary artery hypertension. 4. Low lung volumes with atelectasis. Trace retained secretions in the a right lung airways. 5. Calcified coronary artery atherosclerosis. Electronically Signed   By: Genevie Ann M.D.   On: 09/05/2018 23:44   Ct Abdomen Pelvis W Contrast  Result Date: 09/05/2018 CLINICAL DATA:  68 year old female with chest pain and shortness of breath. Upper abdominal pain. EXAM: CT ANGIOGRAPHY CHEST CT ABDOMEN AND PELVIS WITH CONTRAST TECHNIQUE: Multidetector CT imaging of the chest was performed using the standard protocol during bolus administration of intravenous contrast. Multiplanar CT image reconstructions and MIPs were  obtained to evaluate the vascular anatomy. Multidetector CT imaging of the abdomen and pelvis was performed using the standard protocol during bolus administration of intravenous contrast. CONTRAST:  167mL OMNIPAQUE IOHEXOL 350 MG/ML SOLN COMPARISON:  Portable chest earlier today. CT Abdomen and Pelvis 07/11/2014. FINDINGS: CTA CHEST FINDINGS Cardiovascular: Excellent contrast bolus timing in the pulmonary arterial tree. No focal filling defect identified in the pulmonary arteries to suggest acute pulmonary embolism. Central pulmonary artery enlargement (series 7, image 164). Negative visible aorta aside from atherosclerosis. Calcified coronary artery atherosclerosis. Borderline cardiomegaly. No pericardial effusion. Mediastinum/Nodes: Negative. No lymphadenopathy. Lungs/Pleura: Lower lung volumes compared to the 2016 CT. Bilateral dependent atelectasis. Major airways remain patent but there are some airway secretions in the bronchus intermedius and other right side airways. No pleural effusion. No other abnormal pulmonary opacity. Musculoskeletal: No acute osseous abnormality identified. Review of the MIP images confirms the above findings. CT ABDOMEN and PELVIS FINDINGS Hepatobiliary: Surgically absent gallbladder. Negative liver. Pancreas: Negative. Spleen: Left pararenal stranding and fluid. There is an obstructing 8-9 millimeter calculus lodged at the left UPJ (coronal image 67). Delayed left renal nephrogram and contrast excretion. Distal to the stone the left ureter appears decompressed to the bladder. Adrenals/Urinary Tract: Normal adrenal glands. Right lower pole renal cyst has enlarged since 2016. But there is otherwise normal right renal enhancement and contrast excretion. Decompressed and negative right ureter. There is a  small volume of gas within the urinary bladder on series 12, image 85. No perivesical stranding. Stomach/Bowel: Negative large bowel aside from redundancy and retained stool. Normal  retrocecal appendix. Negative terminal ileum. No dilated small bowel. Negative stomach and duodenum. No free air, free fluid. Vascular/Lymphatic: Mild Aortoiliac calcified atherosclerosis. Major arterial structures are patent. Portal venous system is patent. No lymphadenopathy. Reproductive: Surgically absent. Other: No pelvic free fluid. Musculoskeletal: Lumbar scoliosis, disc and endplate degeneration. No acute osseous abnormality identified. Review of the MIP images confirms the above findings. IMPRESSION: 1. Acute obstructive uropathy on the left with an 8-9 mm calculus lodged at the left UPJ. Suspicion of forniceal rupture. 2. Small volume of gas within the urinary bladder suspicious for UTI unless explained by recent catheterization. 3. No evidence of acute pulmonary embolus. Central pulmonary artery enlargement raising the possibility of pulmonary artery hypertension. 4. Low lung volumes with atelectasis. Trace retained secretions in the a right lung airways. 5. Calcified coronary artery atherosclerosis. Electronically Signed   By: Genevie Ann M.D.   On: 09/05/2018 23:44   Dg Chest Port 1 View  Result Date: 09/05/2018 CLINICAL DATA:  Shortness of breath. EXAM: PORTABLE CHEST 1 VIEW COMPARISON:  01/07/2018 FINDINGS: The cardiomediastinal contours are normal. Slightly low lung volumes. Subsegmental atelectasis at the left lung base. Pulmonary vasculature is normal. No consolidation, pleural effusion, or pneumothorax. No acute osseous abnormalities are seen. IMPRESSION: Low lung volumes with subsegmental left basilar atelectasis. Electronically Signed   By: Keith Rake M.D.   On: 09/05/2018 22:16    Pending Labs Unresulted Labs (From admission, onward)    Start     Ordered   09/06/18 2440  Basic metabolic panel  Tomorrow morning,   R     09/06/18 0025   09/06/18 0500  CBC  Tomorrow morning,   R     09/06/18 0025   09/06/18 0014  CBC  (heparin)  Once,   R    Comments:  Baseline for heparin therapy  IF NOT ALREADY DRAWN.  Notify MD if PLT < 100 K.    09/06/18 0025   09/06/18 0014  Creatinine, serum  (heparin)  Once,   R    Comments:  Baseline for heparin therapy IF NOT ALREADY DRAWN.    09/06/18 0025   09/06/18 0013  HIV antibody (Routine Testing)  Once,   R     09/06/18 0025   09/05/18 2150  Urinalysis, Routine w reflex microscopic  ONCE - STAT,   STAT     09/05/18 2151          Vitals/Pain Today's Vitals   09/06/18 0015 09/06/18 0030 09/06/18 0045 09/06/18 0046  BP:   100/70   Pulse: 77 76 73   Resp: 18 13 18    Temp:      TempSrc:      SpO2: 95% 95% 95%   Weight:      Height:      PainSc:    6     Isolation Precautions No active isolations  Medications Medications  albuterol (PROVENTIL) (2.5 MG/3ML) 0.083% nebulizer solution 3 mL (has no administration in time range)  rosuvastatin (CRESTOR) tablet 40 mg (has no administration in time range)  sertraline (ZOLOFT) tablet 100 mg (has no administration in time range)  ondansetron (ZOFRAN) tablet 4 mg (has no administration in time range)  pantoprazole (PROTONIX) EC tablet 40 mg (has no administration in time range)  gabapentin (NEURONTIN) capsule 400 mg (has no administration in time  range)  heparin injection 5,000 Units (has no administration in time range)  acetaminophen (TYLENOL) tablet 650 mg (has no administration in time range)    Or  acetaminophen (TYLENOL) suppository 650 mg (has no administration in time range)  HYDROcodone-acetaminophen (NORCO/VICODIN) 5-325 MG per tablet 1 tablet (has no administration in time range)  polyethylene glycol (MIRALAX / GLYCOLAX) packet 17 g (has no administration in time range)  insulin aspart (novoLOG) injection 0-20 Units (has no administration in time range)  sodium chloride 0.9 % bolus 500 mL ( Intravenous Stopped 09/05/18 2307)  ondansetron (ZOFRAN) injection 4 mg (4 mg Intravenous Given 09/05/18 2202)  morphine 4 MG/ML injection 4 mg (4 mg Intravenous Given 09/05/18 2244)   iohexol (OMNIPAQUE) 350 MG/ML injection 100 mL (100 mLs Intravenous Contrast Given 09/05/18 2313)  morphine 4 MG/ML injection 4 mg (4 mg Intravenous Given 09/05/18 2344)    Mobility walks High fall risk   Focused Assessments Cardiac Assessment Handoff:    Lab Results  Component Value Date   CKTOTAL 62 01/16/2018   CKMB 12.4 (H) 06/07/2017   TROPONINI <0.03 09/05/2018   Lab Results  Component Value Date   DDIMER 0.61 (H) 08/11/2012   Does the Patient currently have chest pain? No  , Pulmonary Assessment Handoff:  Lung sounds: Bilateral Breath Sounds: Expiratory wheezes, Rhonchi L Breath Sounds: Expiratory wheezes, Rhonchi R Breath Sounds: Rhonchi, Expiratory wheezes O2 Device: Room Air        R Recommendations: See Admitting Provider Note  Report given to:   Additional Notes:   No additional episodes of emesis since receiving zofran. SOB improved after use of inhaler, maintaining SpO2 adequately on RA. CT scan shows 8-16mm obstructing stone to left UPJ. Pt given NS bolus of 539mL but unable to void. Bladder scan shows only about 135mL in bladder. No order from EDP for in&out or foley cath. C/o generalized pain, though somewhat moreso to upper abd quadrants.

## 2018-09-06 NOTE — Progress Notes (Addendum)
The patient underwent her cystoscopy and stent placement without problem.  I have started her on cephalexin 500 mg twice a day.  She should continue this for approximately 10 days.  My office will call her to schedule appropriate follow-up for eventual scheduling of further stone management.  If she has significant urgency and frequency from the stent, oxybutynin 5 mg, 1 p.o. every 8 hours as needed urgency/frequency can be administered.  I spoke with granddaughter following procedure

## 2018-09-06 NOTE — Op Note (Signed)
Preoperative diagnosis: Left UPJ stone with possible UTI and significant pain  Postoperative diagnosis: Same  Principal procedure: Cystoscopy, left retrograde ureteropyelogram, fluoroscopic interpretation, placement of 6 French by 24 cm contour double-J stent without tether  Surgeon: Madiline Saffran  Anesthesia: General endotracheal  Complications: None  Drains: None  Estimated blood loss: None  Indications: 68 year old female recently presenting with one-week history of left flank and abdominal pain, crampy in nature, worsening in severity.  She presented to the emergency room last night where evaluation included eventual CT of the abdomen and pelvis.  This revealed a 9 mm left UPJ stone with hydronephrosis and perinephric stranding.  There was pyuria although the patient was not febrile and there was no evidence of sepsis.  She presents at this time for cystoscopy and stent placement followed by adequate antibiotic management of her pyuria and eventual outpatient management of this UPJ stone.  I discussed the procedure of cystoscopy and stent placement with the patient as well as downstream management.  She understands the procedure as well as risks and complications including but not limited to infection, stent discomfort, ureteral injury as well as anesthetic complications.  She understands these and desires to proceed.  Findings: Bladder was normal without urothelial lesions.  Ureteral orifice ease were normal in configuration location.  Ureteropyelogram performed with Omnipaque revealed a normal ureter.  There was a moderate filling defect at the UPJ with mild to moderate pyelocaliectasis.  No other abnormalities were noted.  Description of procedure: The patient was properly identified and marked in the holding area.  She had received Rocephin less than 12 hours ago.  She was anesthetized and placed in the dorsolithotomy position.  Genitalia and perineum were prepped and draped.  Proper timeout  was performed.  21 French panendoscope was placed in the bladder with inspection performed.  The left ureteral orifice was cannulated with a 6 [place] open-ended catheter with retrograde ureteropyelogram performed using Omnipaque.  The above-mentioned findings were noted.  Following this, a 0.038 inch sensor tip guidewire was advanced through the catheter, up into the upper pole calyceal system with the catheter then removed.  The 6 [place] by 24 cm contour double-J stent was advanced over top of the guidewire.  Once the guidewire was removed, the stent was deployed in the ureter with excellent proximal and distal curl seen using fluoroscopy and cystoscopy, respectively.  At this point, the procedure was terminated.  The bladder was drained.  The scope was removed and the patient was then awakened and taken to the PACU in stable condition.  She tolerated procedure well.

## 2018-09-06 NOTE — Consult Note (Signed)
Urology Consult   Physician requesting consult: Aletta Edouard, MD Reason for consult: Kidney stone  History of Present Illness: [name] is a 68 y.o. female without longstanding urologic history who is admitted to the emergency room for management of a left proximal ureteral stone.  The patient began having symptoms with abdominal pain, cramping and some nausea about a week ago.  She had 2 episodes of nausea.  She has not experienced fever or chills.  She has no prior history of urolithiasis.  After presenting to the emergency room she underwent CT of the abdomen and pelvis which revealed a 9 mm proximal left ureteral stone with hydronephrosis and perinephric extravasation.  There was gas in her bladder.  She does have history of urinary tract infections over the past year or 2.  Currently, she denies dysuria or gross hematuria.  Urologic consultation is requested.    Past Medical History:  Diagnosis Date  . Allergy   . Anemia   . Arthritis    "left knee" (09/08/2015)  . Asthma   . Blood transfusion without reported diagnosis 10/2017   anemic  . Brachial plexus disorders   . CHF (congestive heart failure) ([place])   . Chronic pain    neck pain, headache, neuropathy  . COPD (chronic obstructive pulmonary disease) ([place])    SEES ONLY DR. PATEL   . Depression    "when my husband passed in 2013"  . Facial trauma 05/2018  . Fall 04/19/2018  . Gastric ulcer   . GERD (gastroesophageal reflux disease)   . Hypertension   . Hypertriglyceridemia   . Migraine    "monthly" (09/08/2015)  . Neuromuscular disorder (H[facility])    neuropathy  . Puncture wound of foot, right 05/17/2012   Tetanus shot 3 yrs ago at [facility] in [place], per pt report   . Stroke ([place])    TIAS    IN [place]   4 YRS AGO   . Type II diabetes mellitus ([place]) 2010   diagnosed around 2010, only ever on metformin    Past Surgical History:  Procedure Laterality Date  . AMPUTATION Right 05/01/2015   Procedure: Right  Foot 1st Ray Amputation;  Surgeon: Newt Minion, MD;  Location: [place];  Service: Orthopedics;  Laterality: Right;  . ARTHRODESIS METATARSAL     RIGHT 5 TH   . BILATERAL SALPINGOOPHORECTOMY Bilateral 1986   "after hemtoma evacuations; had to cut me open"  . BIOPSY  11/07/2017   Procedure: BIOPSY;  Surgeon: Irene Shipper, MD;  Location: [facility] ENDOSCOPY;  Service: Endoscopy;;  . CARPAL TUNNEL RELEASE Bilateral   . COLONOSCOPY     10 + yrs ago- pt unsure- was in [place]- pt states  MD will not send records but colon was normal per pt.   Marland Kitchen DILATION AND CURETTAGE OF UTERUS  ~ 1982   S/P miscarriage  . ESOPHAGOGASTRODUODENOSCOPY (EGD) WITH PROPOFOL N/A 11/07/2017   Procedure: ESOPHAGOGASTRODUODENOSCOPY (EGD) WITH PROPOFOL;  Surgeon: Irene Shipper, MD;  Location: [facility] ENDOSCOPY;  Service: Endoscopy;  Laterality: N/A;  . HEMATOMA EVACUATION Right 1986 x 2   "OVARY; w/in 1 wk after hysterectomy"  . KNEE ARTHROSCOPY     LEFT  . LAPAROSCOPIC CHOLECYSTECTOMY    . SHOULDER ARTHROSCOPY W/ ROTATOR CUFF REPAIR Bilateral   . TONSILLECTOMY    . TOTAL KNEE ARTHROPLASTY Left 09/30/2016   Procedure: LEFT TOTAL KNEE ARTHROPLASTY;  Surgeon: Netta Cedars, MD;  Location: [place];  Service: Orthopedics;  Laterality: Left;  .  TUBAL LIGATION    . VAGINAL HYSTERECTOMY  1986     Current Hospital Medications: Scheduled Meds: . gabapentin  400 mg Oral TID  . heparin  5,000 Units Subcutaneous Q8H  . insulin aspart  0-20 Units Subcutaneous Q4H  . pantoprazole  40 mg Oral BID AC  . rosuvastatin  40 mg Oral Daily  . [START ON 09/07/2018] sertraline  100 mg Oral Daily   Continuous Infusions: . sodium chloride 100 mL/hr at 09/06/18 0400  . cefTRIAXone (ROCEPHIN)  IV     PRN Meds:.acetaminophen **OR** acetaminophen, albuterol, HYDROcodone-acetaminophen, ondansetron, polyethylene glycol  Allergies:  Allergies  Allergen Reactions  . Anesthesia S-I-60 Other (See Comments)    Hallucinations.  Yvette Rack  [Cyclobenzaprine] Other (See Comments)    Prolonged QTc to 571, tachycardia  . Soma [Carisoprodol] Itching and Rash    Family History  Problem Relation Age of Onset  . Hyperlipidemia Mother   . Heart attack Father 68  . Hypertension Father   . Cancer Paternal Grandfather        Lung cancer  . Cancer Maternal Grandmother   . Heart attack Maternal Grandmother   . Colon cancer Neg Hx   . Colon polyps Neg Hx   . Esophageal cancer Neg Hx   . Rectal cancer Neg Hx   . Stomach cancer Neg Hx     Social History:  reports that she has been smoking cigarettes. She has a 14.70 pack-year smoking history. She has never used smokeless tobacco. She reports that she does not drink alcohol or use drugs.  ROS: A complete review of systems was performed.  All systems are negative except for pertinent findings as noted.  Physical Exam:  Vital signs in last 24 hours: Temp:  [98 F (36.7 C)-98.4 F (36.9 C)] 98 F (36.7 C) (03/26 0453) Pulse Rate:  [58-80] 62 (03/26 0453) Resp:  [13-24] 16 (03/26 0453) BP: (100-184)/(55-83) 107/65 (03/26 0453) SpO2:  [90 %-98 %] 97 % (03/26 0453) Weight:  [77.1 kg-79.7 kg] 79.7 kg (03/26 0146) General:  Alert and oriented, mild distress HEENT: Normocephalic, atraumatic Neck: No JVD or lymphadenopathy Cardiovascular: Regular rate Lungs: Normal inspiratory and expiratory excursion Abdomen: Obese, soft, left CVA and left lower quadrant tenderness.  No rebound or guarding. Extremities: No edema Neurologic: Grossly intact  Laboratory Data:  Recent Labs    09/05/18 2150 09/06/18 0112 09/06/18 0309  WBC 11.4* 11.9* 10.8*  HGB 13.7 11.8* 11.6*  HCT 44.2 38.8 36.3  PLT 166 132* 127*    Recent Labs    09/05/18 2150 09/06/18 0112 09/06/18 0309  NA 140  --  137  K 3.7  --  4.2  CL 107  --  110  GLUCOSE 140*  --  128*  BUN 12  --  14  CALCIUM 9.8  --  8.8*  CREATININE 1.06* 1.19* 1.27*     Results for orders placed or performed during the hospital  encounter of 09/05/18 (from the past 24 hour(s))  CBC with Differential/Platelet     Status: Abnormal   Collection Time: 09/05/18  9:50 PM  Result Value Ref Range   WBC 11.4 (H) 4.0 - 10.5 K/uL   RBC 4.56 3.87 - 5.11 MIL/uL   Hemoglobin 13.7 12.0 - 15.0 g/dL   HCT 44.2 36.0 - 46.0 %   MCV 96.9 80.0 - 100.0 fL   MCH 30.0 26.0 - 34.0 pg   MCHC 31.0 30.0 - 36.0 g/dL   RDW 14.7 11.5 - 15.5 %  Platelets 166 150 - 400 K/uL   nRBC 0.2 0.0 - 0.2 %   Neutrophils Relative % 54 %   Neutro Abs 6.2 1.7 - 7.7 K/uL   Lymphocytes Relative 23 %   Lymphs Abs 2.6 0.7 - 4.0 K/uL   Monocytes Relative 8 %   Monocytes Absolute 0.9 0.1 - 1.0 K/uL   Eosinophils Relative 13 %   Eosinophils Absolute 1.5 (H) 0.0 - 0.5 K/uL   Basophils Relative 1 %   Basophils Absolute 0.1 0.0 - 0.1 K/uL   Immature Granulocytes 1 %   Abs Immature Granulocytes 0.07 0.00 - 0.07 K/uL  Comprehensive metabolic panel     Status: Abnormal   Collection Time: 09/05/18  9:50 PM  Result Value Ref Range   Sodium 140 135 - 145 mmol/L   Potassium 3.7 3.5 - 5.1 mmol/L   Chloride 107 98 - 111 mmol/L   CO2 23 22 - 32 mmol/L   Glucose, Bld 140 (H) 70 - 99 mg/dL   BUN 12 8 - 23 mg/dL   Creatinine, Ser 1.06 (H) 0.44 - 1.00 mg/dL   Calcium 9.8 8.9 - 10.3 mg/dL   Total Protein 7.5 6.5 - 8.1 g/dL   Albumin 4.2 3.5 - 5.0 g/dL   AST 45 (H) 15 - 41 U/L   ALT 65 (H) 0 - 44 U/L   Alkaline Phosphatase 69 38 - 126 U/L   Total Bilirubin 0.6 0.3 - 1.2 mg/dL   GFR calc non Af Amer 55 (L) >60 mL/min   GFR calc Af Amer >60 >60 mL/min   Anion gap 10 5 - 15  Brain natriuretic peptide     Status: Abnormal   Collection Time: 09/05/18  9:50 PM  Result Value Ref Range   B Natriuretic Peptide 176.1 (H) 0.0 - 100.0 pg/mL  Troponin I - Once     Status: None   Collection Time: 09/05/18 10:04 PM  Result Value Ref Range   Troponin I <0.03 <0.03 ng/mL  Influenza panel by PCR (type A & B)     Status: None   Collection Time: 09/05/18 10:09 PM  Result  Value Ref Range   Influenza A By PCR NEGATIVE NEGATIVE   Influenza B By PCR NEGATIVE NEGATIVE  CBG monitoring, ED     Status: Abnormal   Collection Time: 09/06/18 12:50 AM  Result Value Ref Range   Glucose-Capillary 164 (H) 70 - 99 mg/dL  CBC     Status: Abnormal   Collection Time: 09/06/18  1:12 AM  Result Value Ref Range   WBC 11.9 (H) 4.0 - 10.5 K/uL   RBC 3.91 3.87 - 5.11 MIL/uL   Hemoglobin 11.8 (L) 12.0 - 15.0 g/dL   HCT 38.8 36.0 - 46.0 %   MCV 99.2 80.0 - 100.0 fL   MCH 30.2 26.0 - 34.0 pg   MCHC 30.4 30.0 - 36.0 g/dL   RDW 15.0 11.5 - 15.5 %   Platelets 132 (L) 150 - 400 K/uL   nRBC 0.0 0.0 - 0.2 %  Creatinine, serum     Status: Abnormal   Collection Time: 09/06/18  1:12 AM  Result Value Ref Range   Creatinine, Ser 1.19 (H) 0.44 - 1.00 mg/dL   GFR calc non Af Amer 48 (L) >60 mL/min   GFR calc Af Amer 55 (L) >60 mL/min  Basic metabolic panel     Status: Abnormal   Collection Time: 09/06/18  3:09 AM  Result Value Ref Range  Sodium 137 135 - 145 mmol/L   Potassium 4.2 3.5 - 5.1 mmol/L   Chloride 110 98 - 111 mmol/L   CO2 22 22 - 32 mmol/L   Glucose, Bld 128 (H) 70 - 99 mg/dL   BUN 14 8 - 23 mg/dL   Creatinine, Ser 1.27 (H) 0.44 - 1.00 mg/dL   Calcium 8.8 (L) 8.9 - 10.3 mg/dL   GFR calc non Af Amer 44 (L) >60 mL/min   GFR calc Af Amer 51 (L) >60 mL/min   Anion gap 5 5 - 15  CBC     Status: Abnormal   Collection Time: 09/06/18  3:09 AM  Result Value Ref Range   WBC 10.8 (H) 4.0 - 10.5 K/uL   RBC 3.75 (L) 3.87 - 5.11 MIL/uL   Hemoglobin 11.6 (L) 12.0 - 15.0 g/dL   HCT 36.3 36.0 - 46.0 %   MCV 96.8 80.0 - 100.0 fL   MCH 30.9 26.0 - 34.0 pg   MCHC 32.0 30.0 - 36.0 g/dL   RDW 15.0 11.5 - 15.5 %   Platelets 127 (L) 150 - 400 K/uL   nRBC 0.0 0.0 - 0.2 %  Glucose, capillary     Status: Abnormal   Collection Time: 09/06/18  4:51 AM  Result Value Ref Range   Glucose-Capillary 106 (H) 70 - 99 mg/dL  Urinalysis, Routine w reflex microscopic     Status: Abnormal    Collection Time: 09/06/18  6:30 AM  Result Value Ref Range   Color, Urine YELLOW YELLOW   APPearance CLEAR CLEAR   Specific Gravity, Urine 1.010 1.005 - 1.030   pH 5.5 5.0 - 8.0   Glucose, UA NEGATIVE NEGATIVE mg/dL   Hgb urine dipstick TRACE (A) NEGATIVE   Bilirubin Urine NEGATIVE NEGATIVE   Ketones, ur NEGATIVE NEGATIVE mg/dL   Protein, ur 30 (A) NEGATIVE mg/dL   Nitrite POSITIVE (A) NEGATIVE   Leukocytes,Ua NEGATIVE NEGATIVE  Urinalysis, Microscopic (reflex)     Status: Abnormal   Collection Time: 09/06/18  6:30 AM  Result Value Ref Range   RBC / HPF 0-5 0 - 5 RBC/hpf   WBC, UA 0-5 0 - 5 WBC/hpf   Bacteria, UA MANY (A) NONE SEEN   Squamous Epithelial / LPF 0-5 0 - 5   No results found for this or any previous visit (from the past 240 hour(s)).  Renal Function: Recent Labs    09/05/18 2150 09/06/18 0112 09/06/18 0309  CREATININE 1.06* 1.19* 1.27*   Estimated Creatinine Clearance: 47.1 mL/min (A) (by C-G formula based on SCr of 1.27 mg/dL (H)).  Radiologic Imaging: Ct Angio Chest Pe W/cm &/or Wo Cm  Result Date: 09/05/2018 CLINICAL DATA:  68 year old female with chest pain and shortness of breath. Upper abdominal pain. EXAM: CT ANGIOGRAPHY CHEST CT ABDOMEN AND PELVIS WITH CONTRAST TECHNIQUE: Multidetector CT imaging of the chest was performed using the standard protocol during bolus administration of intravenous contrast. Multiplanar CT image reconstructions and MIPs were obtained to evaluate the vascular anatomy. Multidetector CT imaging of the abdomen and pelvis was performed using the standard protocol during bolus administration of intravenous contrast. CONTRAST:  144mL OMNIPAQUE IOHEXOL 350 MG/ML SOLN COMPARISON:  Portable chest earlier today. CT Abdomen and Pelvis 07/11/2014. FINDINGS: CTA CHEST FINDINGS Cardiovascular: Excellent contrast bolus timing in the pulmonary arterial tree. No focal filling defect identified in the pulmonary arteries to suggest acute pulmonary  embolism. Central pulmonary artery enlargement (series 7, image 164). Negative visible aorta aside from atherosclerosis. Calcified  coronary artery atherosclerosis. Borderline cardiomegaly. No pericardial effusion. Mediastinum/Nodes: Negative. No lymphadenopathy. Lungs/Pleura: Lower lung volumes compared to the 2016 CT. Bilateral dependent atelectasis. Major airways remain patent but there are some airway secretions in the bronchus intermedius and other right side airways. No pleural effusion. No other abnormal pulmonary opacity. Musculoskeletal: No acute osseous abnormality identified. Review of the MIP images confirms the above findings. CT ABDOMEN and PELVIS FINDINGS Hepatobiliary: Surgically absent gallbladder. Negative liver. Pancreas: Negative. Spleen: Left pararenal stranding and fluid. There is an obstructing 8-9 millimeter calculus lodged at the left UPJ (coronal image 67). Delayed left renal nephrogram and contrast excretion. Distal to the stone the left ureter appears decompressed to the bladder. Adrenals/Urinary Tract: Normal adrenal glands. Right lower pole renal cyst has enlarged since 2016. But there is otherwise normal right renal enhancement and contrast excretion. Decompressed and negative right ureter. There is a small volume of gas within the urinary bladder on series 12, image 85. No perivesical stranding. Stomach/Bowel: Negative large bowel aside from redundancy and retained stool. Normal retrocecal appendix. Negative terminal ileum. No dilated small bowel. Negative stomach and duodenum. No free air, free fluid. Vascular/Lymphatic: Mild Aortoiliac calcified atherosclerosis. Major arterial structures are patent. Portal venous system is patent. No lymphadenopathy. Reproductive: Surgically absent. Other: No pelvic free fluid. Musculoskeletal: Lumbar scoliosis, disc and endplate degeneration. No acute osseous abnormality identified. Review of the MIP images confirms the above findings. IMPRESSION:  1. Acute obstructive uropathy on the left with an 8-9 mm calculus lodged at the left UPJ. Suspicion of forniceal rupture. 2. Small volume of gas within the urinary bladder suspicious for UTI unless explained by recent catheterization. 3. No evidence of acute pulmonary embolus. Central pulmonary artery enlargement raising the possibility of pulmonary artery hypertension. 4. Low lung volumes with atelectasis. Trace retained secretions in the a right lung airways. 5. Calcified coronary artery atherosclerosis. Electronically Signed   By: Genevie Ann M.D.   On: 09/05/2018 23:44   Ct Abdomen Pelvis W Contrast  Result Date: 09/05/2018 CLINICAL DATA:  68 year old female with chest pain and shortness of breath. Upper abdominal pain. EXAM: CT ANGIOGRAPHY CHEST CT ABDOMEN AND PELVIS WITH CONTRAST TECHNIQUE: Multidetector CT imaging of the chest was performed using the standard protocol during bolus administration of intravenous contrast. Multiplanar CT image reconstructions and MIPs were obtained to evaluate the vascular anatomy. Multidetector CT imaging of the abdomen and pelvis was performed using the standard protocol during bolus administration of intravenous contrast. CONTRAST:  167mL OMNIPAQUE IOHEXOL 350 MG/ML SOLN COMPARISON:  Portable chest earlier today. CT Abdomen and Pelvis 07/11/2014. FINDINGS: CTA CHEST FINDINGS Cardiovascular: Excellent contrast bolus timing in the pulmonary arterial tree. No focal filling defect identified in the pulmonary arteries to suggest acute pulmonary embolism. Central pulmonary artery enlargement (series 7, image 164). Negative visible aorta aside from atherosclerosis. Calcified coronary artery atherosclerosis. Borderline cardiomegaly. No pericardial effusion. Mediastinum/Nodes: Negative. No lymphadenopathy. Lungs/Pleura: Lower lung volumes compared to the 2016 CT. Bilateral dependent atelectasis. Major airways remain patent but there are some airway secretions in the bronchus  intermedius and other right side airways. No pleural effusion. No other abnormal pulmonary opacity. Musculoskeletal: No acute osseous abnormality identified. Review of the MIP images confirms the above findings. CT ABDOMEN and PELVIS FINDINGS Hepatobiliary: Surgically absent gallbladder. Negative liver. Pancreas: Negative. Spleen: Left pararenal stranding and fluid. There is an obstructing 8-9 millimeter calculus lodged at the left UPJ (coronal image 67). Delayed left renal nephrogram and contrast excretion. Distal to the stone the left ureter  appears decompressed to the bladder. Adrenals/Urinary Tract: Normal adrenal glands. Right lower pole renal cyst has enlarged since 2016. But there is otherwise normal right renal enhancement and contrast excretion. Decompressed and negative right ureter. There is a small volume of gas within the urinary bladder on series 12, image 85. No perivesical stranding. Stomach/Bowel: Negative large bowel aside from redundancy and retained stool. Normal retrocecal appendix. Negative terminal ileum. No dilated small bowel. Negative stomach and duodenum. No free air, free fluid. Vascular/Lymphatic: Mild Aortoiliac calcified atherosclerosis. Major arterial structures are patent. Portal venous system is patent. No lymphadenopathy. Reproductive: Surgically absent. Other: No pelvic free fluid. Musculoskeletal: Lumbar scoliosis, disc and endplate degeneration. No acute osseous abnormality identified. Review of the MIP images confirms the above findings. IMPRESSION: 1. Acute obstructive uropathy on the left with an 8-9 mm calculus lodged at the left UPJ. Suspicion of forniceal rupture. 2. Small volume of gas within the urinary bladder suspicious for UTI unless explained by recent catheterization. 3. No evidence of acute pulmonary embolus. Central pulmonary artery enlargement raising the possibility of pulmonary artery hypertension. 4. Low lung volumes with atelectasis. Trace retained secretions  in the a right lung airways. 5. Calcified coronary artery atherosclerosis. Electronically Signed   By: Genevie Ann M.D.   On: 09/05/2018 23:44   Dg Chest Port 1 View  Result Date: 09/05/2018 CLINICAL DATA:  Shortness of breath. EXAM: PORTABLE CHEST 1 VIEW COMPARISON:  01/07/2018 FINDINGS: The cardiomediastinal contours are normal. Slightly low lung volumes. Subsegmental atelectasis at the left lung base. Pulmonary vasculature is normal. No consolidation, pleural effusion, or pneumothorax. No acute osseous abnormalities are seen. IMPRESSION: Low lung volumes with subsegmental left basilar atelectasis. Electronically Signed   By: Keith Rake M.D.   On: 09/05/2018 22:16    I independently reviewed the above imaging studies.  Impression/Assessment:  1.  Left ureteral stone, proximal in nature and moderate in size.  This is been present for at least a week and more than likely will not pass with conservative management  2.  Pyuria.  She does have air in her bladder which may be forming from a gas-forming organism, fistula or catheterization (more likely from gas-forming organism)  Plan:  At this point, I recommend urgent cystoscopy, retrograde pyelogram and stenting.  I will get that set up to be done today.  In the meantime, I have made her n.p.o.  I discussed the procedure as well as risks and complications with her.  These include but are not limited to trauma to the urinary tract as well as anesthetic complications.  I also discussed a second stage which would be shockwave lithotripsy or ureteroscopy when her medical status has improved and her pyuria has been adequately treated, more than likely 2 to 3 weeks down the road.  We will follow her appropriately.

## 2018-09-06 NOTE — ED Notes (Signed)
Bladder scan shows 160mL. Pt has had no urge to urinate for past 7hrs.

## 2018-09-06 NOTE — Progress Notes (Signed)
   Subjective: [name] reports she has had urinary frequency, urinary incontinence, diffuse abdominal pain, and a sensation of incomplete bladder emptying for about one week. Overnight she required an in-and-out cath for urinary retention of 767ml. She reports that she continues to have abdominal pain today. She denies fevers. The plan for cystoscopy and stenting with urology was discussed. The patient also reports that she has had fatigue, cough, dyspnea, and rhinorrhea. She denies recent travel or known sick contacts. She has only left her house to go to the grocery store in recent weeks. All of her questions were answered.   Objective:  Vital signs in last 24 hours: Vitals:   09/06/18 0045 09/06/18 0100 09/06/18 0146 09/06/18 0453  BP: 100/70 (!) 109/55 111/68 107/65  Pulse: 73 71 72 62  Resp: 18 16 18 16   Temp:   98.4 F (36.9 C) 98 F (36.7 C)  TempSrc:   Oral Oral  SpO2: 95% 91% 94% 97%  Weight:   79.7 kg   Height:   5\' 6"  (1.676 m)    Gen: alert and oriented, no distress CV: RRR, no murmurs Pulm: upper airway wheezing, lungs are otherwise clear, normal effort on room air Abd: soft, non-distended, diffuse ttp Ext: no edema  Assessment/Plan:  Active Problems:   Nephrolithiasis  [name] is a 69 year old female with presents with abdominal pain, urinary incontinence,  increasing shortness of breath and found to have an acute obstructive uropathy of the left kidney with a small forniceal rupture.  Acute obstructive uropathy 2/2 left ureteral stone - Continues to have abdominal pain. Required in and out catheterization for urinary retention. - Cr elevated to 1.27, baseline is around 1. Plan - Urology consulted, appreciate recs. Plan for cystoscopy and stent placement this afternoon. - Norco 5-325 mg every 4 hours as needed pain - Zofran prn for nausea - IVF  - Observe overnight, likely discharge tomorrow if patient is symptomatically improved - Outpatient f/u with  urology   UTI - UA with trace hemoglobin, nitrites, and many bacteria. Afebrile with mild leukocytosis. Plan - Urine culture - Give one dose of ceftriaxone, switch to PO regimen tomorrow per urology   COPD  - No wheezing on exam. Saturating well on room air. Patient has chronic productive cough.  - Flu negative. Covid not suspected as patient has been afebrile, does not have leukopenia, has rhinorrhea, and has no known sick contacts or travel exposures.  Plan - RVP - Albuterol prn   Type 2 diabetes: Home meds include Januvia 50 mg daily, metformin 1000 mg twice daily.  A1c elevated at 7.3. Blood sugars within goal. - SSI  Dispo: Anticipated discharge in approximately 1 day.  Dorrell, Andree Elk, MD 09/06/2018, 7:01 AM Pager: [phone]

## 2018-09-06 NOTE — Anesthesia Procedure Notes (Signed)
Procedure Name: Intubation Date/Time: 09/06/2018 11:29 AM Performed by: Scheryl Darter, CRNA Pre-anesthesia Checklist: Patient identified, Emergency Drugs available, Suction available and Patient being monitored Patient Re-evaluated:Patient Re-evaluated prior to induction Oxygen Delivery Method: Circle System Utilized Preoxygenation: Pre-oxygenation with 100% oxygen Induction Type: IV induction Ventilation: Mask ventilation without difficulty Tube type: Oral Tube size: 7.0 mm Number of attempts: 1 Airway Equipment and Method: Stylet and Oral airway Placement Confirmation: ETT inserted through vocal cords under direct vision,  positive ETCO2 and breath sounds checked- equal and bilateral Secured at: 21 cm Tube secured with: Tape Dental Injury: Teeth and Oropharynx as per pre-operative assessment

## 2018-09-06 NOTE — Progress Notes (Signed)
0120  Pt arrived on floor, alert and orientedx4, vital signs: Temp: 98.4 oral, BP: 110/68 R arm lying, pulse 72, respi 18, spo2 94 RA. Still unable to void   0550 Did a bladder scan of 787 ml  0555 Paged MD and made aware, verbal order given for in and out cath  0610 in and out cath done, tolerated well output= 450 ml  urinalysis sent to lab

## 2018-09-07 ENCOUNTER — Encounter (HOSPITAL_COMMUNITY): Payer: Self-pay | Admitting: Urology

## 2018-09-07 ENCOUNTER — Other Ambulatory Visit: Payer: Self-pay | Admitting: Internal Medicine

## 2018-09-07 DIAGNOSIS — N179 Acute kidney failure, unspecified: Secondary | ICD-10-CM

## 2018-09-07 LAB — GLUCOSE, CAPILLARY
GLUCOSE-CAPILLARY: 205 mg/dL — AB (ref 70–99)
Glucose-Capillary: 190 mg/dL — ABNORMAL HIGH (ref 70–99)
Glucose-Capillary: 263 mg/dL — ABNORMAL HIGH (ref 70–99)

## 2018-09-07 LAB — URINE CULTURE: Culture: NO GROWTH

## 2018-09-07 LAB — BASIC METABOLIC PANEL
Anion gap: 11 (ref 5–15)
BUN: 20 mg/dL (ref 8–23)
CHLORIDE: 107 mmol/L (ref 98–111)
CO2: 18 mmol/L — ABNORMAL LOW (ref 22–32)
Calcium: 9 mg/dL (ref 8.9–10.3)
Creatinine, Ser: 1.45 mg/dL — ABNORMAL HIGH (ref 0.44–1.00)
GFR calc Af Amer: 44 mL/min — ABNORMAL LOW (ref >60)
GFR calc non Af Amer: 38 mL/min — ABNORMAL LOW (ref >60)
Glucose, Bld: 208 mg/dL — ABNORMAL HIGH (ref 70–99)
Potassium: 4.2 mmol/L (ref 3.5–5.1)
SODIUM: 136 mmol/L (ref 135–145)

## 2018-09-07 MED ORDER — CEPHALEXIN 500 MG PO CAPS: 500.0000 mg | ORAL_CAPSULE | Freq: Two times a day (BID) | ORAL | 0 refills | Status: DC

## 2018-09-07 MED ORDER — OXYCODONE HCL 5 MG PO TABS: 5.0000 mg | ORAL_TABLET | Freq: Four times a day (QID) | ORAL | 0 refills | Status: AC | PRN

## 2018-09-07 MED ORDER — OXYBUTYNIN CHLORIDE 5 MG PO TABS: 5.0000 mg | ORAL_TABLET | Freq: Three times a day (TID) | ORAL | 0 refills | Status: DC | PRN

## 2018-09-07 NOTE — Discharge Summary (Signed)
Name: [name] MRN: [id number] DOB: [birth year] 68 y.o. PCP: Lars Mage, MD  Date of Admission: 09/05/2018  9:38 PM Date of Discharge: 09/07/2018 Attending Physician: Dr. Dareen Piano  Discharge Diagnosis: 1. Acute obstructive uropathy 2/2 left ureteral stone and UTI  Discharge Medications: Allergies as of 09/07/2018      Reactions   Anesthesia S-i-60 Other (See Comments)   Hallucinations.   Flexeril [cyclobenzaprine] Other (See Comments)   Prolonged QTc to 571, tachycardia   Soma [carisoprodol] Itching, Rash      Medication List    TAKE these medications   albuterol 108 (90 Base) MCG/ACT inhaler Commonly known as:  PROVENTIL HFA;VENTOLIN HFA Inhale 1-2 puffs into the lungs every 6 (six) hours as needed for wheezing or shortness of breath.   cephALEXin 500 MG capsule Commonly known as:  KEFLEX Take 1 capsule (500 mg total) by mouth every 12 (twelve) hours.   gabapentin 400 MG capsule Commonly known as:  NEURONTIN TAKE 1 CAPSULE BY MOUTH THREE TIMES DAILY   Januvia 50 MG tablet Generic drug:  sitaGLIPtin TAKE 1 TABLET(50 MG) BY MOUTH DAILY What changed:  See the new instructions.   metFORMIN 1000 MG (MOD) 24 hr tablet Commonly known as:  GLUMETZA Take 1 tablet (1,000 mg total) by mouth 2 (two) times daily with a meal.   ondansetron 4 MG tablet Commonly known as:  ZOFRAN Take 1 tablet (4 mg total) by mouth every 6 (six) hours as needed for nausea or vomiting.   oxybutynin 5 MG tablet Commonly known as:  DITROPAN Take 1 tablet (5 mg total) by mouth every 8 (eight) hours as needed for bladder spasms (urinary frequency).   oxyCODONE 5 MG immediate release tablet Commonly known as:  Roxicodone Take 1 tablet (5 mg total) by mouth every 6 (six) hours as needed for up to 5 days for severe pain.   pantoprazole 40 MG tablet Commonly known as:  PROTONIX Take 1 tablet (40 mg total) by mouth 2 (two) times daily. TAKE 1 TABLET(40 MG) BY MOUTH twice per day.  Before  1st and last meal of the day. What changed:    when to take this  additional instructions   rosuvastatin 40 MG tablet Commonly known as:  CRESTOR TAKE 1 TABLET BY MOUTH EVERY DAY   sertraline 100 MG tablet Commonly known as:  ZOLOFT TAKE 1 TABLET(100 MG) BY MOUTH DAILY What changed:  See the new instructions.       Disposition and follow-up:   [name] was discharged from [facility] in Good condition.  At the hospital follow up visit please address:  1. Acute obstructive uropathy 2/2 left ureteral stone and UTI - Discharged with 10 day course of Keflex (to end 4/5) - Please ensure patient has appropriate f/u with urology - Cr at discharge was 1.4. Baseline is 1. Please assess kidney function at f/u.  2.  Labs / imaging needed at time of follow-up: BMP  3.  Pending labs/ test needing follow-up: urine culture  Follow-up Appointments: Follow-up Information    Franchot Gallo, MD.   Specialty:  Urology Why:  We will call you to set up appt Contact information: [place] [address] [phone]           Hospital Course by problem list: 1. Acute obstructive uropathy 2/2 left ureteral stone and UTI: [name] is a 69 year old female withpresents with abdominal pain, urinary incontinence, increasing shortness of breath and found to have an  acute obstructive uropathy of the left kidney with a small forniceal rupture. UA on admission also showed UTI. She underwent cystoscopy and stent placement with urology on 3/26. Her abdominal pain improved, but she continued to have dysuria and urinary incontinence s/p procedure. Her Cr was 1.4 at discharge, increased from a baseline of 1. She was discharged with a 10 day course of Keflex, oxycodone prn for pain control, and oxybutynin prn for urinary urgency/frequency. She will f/u with the [facility] in one week for repeat BMP. She will also f/u with outpatient urology as scheduled.   Discharge  Vitals:   BP (!) 162/74 (BP Location: Right Leg)   Pulse 72   Temp 97.8 F (36.6 C) (Oral)   Resp 20   Ht 5\' 6"  (1.676 m)   Wt 79.7 kg   SpO2 94%   BMI 28.36 kg/m   Pertinent Labs, Studies, and Procedures:  CBC Latest Ref Rng & Units 09/06/2018 09/06/2018 09/05/2018  WBC 4.0 - 10.5 K/uL 10.8(H) 11.9(H) 11.4(H)  Hemoglobin 12.0 - 15.0 g/dL 11.6(L) 11.8(L) 13.7  Hematocrit 36.0 - 46.0 % 36.3 38.8 44.2  Platelets 150 - 400 K/uL 127(L) 132(L) 166   CMP Latest Ref Rng & Units 09/07/2018 09/06/2018 09/06/2018  Glucose 70 - 99 mg/dL 208(H) 128(H) -  BUN 8 - 23 mg/dL 20 14 -  Creatinine 0.44 - 1.00 mg/dL 1.45(H) 1.27(H) 1.19(H)  Sodium 135 - 145 mmol/L 136 137 -  Potassium 3.5 - 5.1 mmol/L 4.2 4.2 -  Chloride 98 - 111 mmol/L 107 110 -  CO2 22 - 32 mmol/L 18(L) 22 -  Calcium 8.9 - 10.3 mg/dL 9.0 8.8(L) -  Total Protein 6.5 - 8.1 g/dL - - -  Total Bilirubin 0.3 - 1.2 mg/dL - - -  Alkaline Phos 38 - 126 U/L - - -  AST 15 - 41 U/L - - -  ALT 0 - 44 U/L - - -   CT Abdomen/Pelvis 09/05/2018 1. Acute obstructive uropathy on the left with an 8-9 mm calculus lodged at the left UPJ. Suspicion of forniceal rupture. 2. Small volume of gas within the urinary bladder suspicious for UTI unless explained by recent catheterization. 3. No evidence of acute pulmonary embolus. Central pulmonary artery enlargement raising the possibility of pulmonary artery hypertension. 4. Low lung volumes with atelectasis. Trace retained secretions in the a right lung airways. 5. Calcified coronary artery atherosclerosis.  Discharge Instructions: Discharge Instructions    Discharge instructions   Complete by:  As directed    It was a pleasure taking care of you this hospital stay, [name],  You were hospitalized for a kidney stone and bladder infection. You had a stent placed with urology. You will need to follow-up with them in their outpatient clinic as scheduled.  - If you experience pain, you can take  over the counter tylenol. I have also prescribed a short course of oxycodone to take for severe pain. You can take this every 6 hours.  - If you experience urinary frequency or urgency, you can take oxybutynin every 8 hrs as needed. - You will need to finish a 10 day course of antibiotics. Take Keflex twice a day. Start this medication tonight.  Please go to the internal medicine clinic at 2pm next Friday 4/3 for blood work. We would like to assess your kidney function at that time.   Feel free to call our clinic at [phone] if you have any questions.  Thanks, Dr. Annie Paras   Increase  activity slowly   Complete by:  As directed       Signed: Dorrell, Andree Elk, MD 09/07/2018, 10:48 AM   Pager: [phone]

## 2018-09-07 NOTE — Progress Notes (Signed)
   Subjective: No acute events overnight. Her procedure went well yesterday. She is still having some dysuria and hematuria. Complains of persistent intermittent headaches since trauma. She has felt exhausted for the last week which we explained is likely related to her infection. She is concerned about urinary incontinence that has been present for 3 weeks. We reassured this was also infection related and secondary to her recent procedure. Patient continues to have persistent shortness of breath which has also been a chronic issue for her. She is tolerating PO well. Discussed plan for oral antibiotics for another 10 days and discharge home later today.   Objective:  Vital signs in last 24 hours: Vitals:   09/06/18 1305 09/06/18 1339 09/06/18 2043 09/07/18 0418  BP: 122/70 103/66 124/76 (!) 162/74  Pulse: 79 77 75 72  Resp: 19 18 18 20   Temp: (!) 97 F (36.1 C) 98.6 F (37 C) 97.8 F (36.6 C) 97.8 F (36.6 C)  TempSrc:  Oral Oral Oral  SpO2: 97% 94% 100% 94%  Weight:      Height:       General: awake, alert, lying in bed in NAD CV: RRR; no murmurs  Pulm: normal effort on room air; lungs CTAB Abd: soft, non-distended, non-tender  Ext: no edema  Assessment/Plan:  Active Problems:   Nephrolithiasis  Ms. Pavlich is a 68 year old female withpresents with abdominal pain, urinary incontinence, increasing shortness of breath and found to have an acute obstructive uropathy of the left kidney with a small forniceal rupture. UA on admission also showed UTI. She underwent cystoscopy and stent placement with urology on 3/26.  Acute obstructive uropathy 2/2 left ureteral stone UTI - Afebrile. Pain significantly improved, continues to have dysuria and urinary incontinence 2/2 procedure. - Cr increased from 1.2 to 1.4 today. Baseline is around 1. Plan - Keflex 500mg  BID (day 1/10) - Norco 5-325 mg every 4 hours as needed pain - Oxybutynin 5mg  q8hrs prn for urinary urgency/frequency -  Zofran prn for nausea - Outpatient f/u with urology   COPD - Persistent dyspnea. Oxygenates > 88% on room air with rest and ambulation.  - Flu negative. RVP negative. Plan - Albuterol prn   Type 2 diabetes: Home meds include Januvia 50 mg daily, metformin 1000 mg twice daily. A1c elevated at 7.3.  Plan - SSI during hospitalization - Transition to home meds on discharge  Dispo: Anticipated discharge today.  Jazlyn Tippens, Andree Elk, MD 09/07/2018, 6:42 AM Pager: [phone]

## 2018-09-07 NOTE — Progress Notes (Signed)
SATURATION QUALIFICATIONS: (This note is used to comply with regulatory documentation for home oxygen)  Patient Saturations on Room Air at Rest = 96%  Patient Saturations on Room Air while Ambulating = 89-93%  Patient Saturations on 0 Liters of oxygen while Ambulating =   Please briefly explain why patient needs home oxygen:

## 2018-09-07 NOTE — Progress Notes (Signed)
Patient discharged to home with instructions. 

## 2018-09-11 ENCOUNTER — Ambulatory Visit (INDEPENDENT_AMBULATORY_CARE_PROVIDER_SITE_OTHER): Payer: Medicare Other | Admitting: Orthotics

## 2018-09-11 ENCOUNTER — Ambulatory Visit (INDEPENDENT_AMBULATORY_CARE_PROVIDER_SITE_OTHER): Payer: Medicare Other | Admitting: Podiatry

## 2018-09-11 ENCOUNTER — Encounter: Payer: Self-pay | Admitting: Podiatry

## 2018-09-11 ENCOUNTER — Other Ambulatory Visit: Payer: Self-pay

## 2018-09-11 VITALS — Temp 96.6°F

## 2018-09-11 DIAGNOSIS — E1142 Type 2 diabetes mellitus with diabetic polyneuropathy: Secondary | ICD-10-CM | POA: Diagnosis not present

## 2018-09-11 DIAGNOSIS — L97511 Non-pressure chronic ulcer of other part of right foot limited to breakdown of skin: Secondary | ICD-10-CM

## 2018-09-11 DIAGNOSIS — B351 Tinea unguium: Secondary | ICD-10-CM

## 2018-09-11 DIAGNOSIS — S98111S Complete traumatic amputation of right great toe, sequela: Secondary | ICD-10-CM | POA: Diagnosis not present

## 2018-09-11 DIAGNOSIS — E08621 Diabetes mellitus due to underlying condition with foot ulcer: Secondary | ICD-10-CM | POA: Diagnosis not present

## 2018-09-11 DIAGNOSIS — S90414A Abrasion, right lesser toe(s), initial encounter: Secondary | ICD-10-CM

## 2018-09-11 DIAGNOSIS — L84 Corns and callosities: Secondary | ICD-10-CM | POA: Diagnosis not present

## 2018-09-11 DIAGNOSIS — E1149 Type 2 diabetes mellitus with other diabetic neurological complication: Secondary | ICD-10-CM | POA: Diagnosis not present

## 2018-09-11 NOTE — Progress Notes (Signed)
Subjective: [name] is a 68 y.o. y.o. female presents today for high risk foot care. She has h/o right hallux amputation.  She has admittedly been noncompliant with shoe gear, oftentimes wearing flip flops, which she says are more comfortable to her feet.  She has chronic preulcerative callus right foot which has healed. She relates tenderness to the area today. She denies any drainage, redness or swelling from the area.  She is concerned about the 5th metatarsal base left foot today which she has some discomfort.  She has received her new diabetic shoes on today's visit: a pair of gray [employer] sneakers with toe filler and offloading of plantar callus for the right foot.  She also relates she picked at skin of her right 3rd toe.   Lars Mage, MD  is her PCP and last dos was 07/20/2018.   Current Outpatient Medications:  .  albuterol (PROVENTIL HFA;VENTOLIN HFA) 108 (90 Base) MCG/ACT inhaler, Inhale 1-2 puffs into the lungs every 6 (six) hours as needed for wheezing or shortness of breath., Disp: 1 Inhaler, Rfl: 5 .  cephALEXin (KEFLEX) 500 MG capsule, Take 1 capsule (500 mg total) by mouth every 12 (twelve) hours., Disp: 19 capsule, Rfl: 0 .  gabapentin (NEURONTIN) 400 MG capsule, TAKE 1 CAPSULE BY MOUTH THREE TIMES DAILY (Patient taking differently: Take 400 mg by mouth 3 (three) times daily. ), Disp: 90 capsule, Rfl: 1 .  JANUVIA 50 MG tablet, TAKE 1 TABLET(50 MG) BY MOUTH DAILY (Patient taking differently: Take 50 mg by mouth daily. ), Disp: 90 tablet, Rfl: 0 .  metFORMIN (GLUMETZA) 1000 MG (MOD) 24 hr tablet, Take 1 tablet (1,000 mg total) by mouth 2 (two) times daily with a meal., Disp: 180 tablet, Rfl: 0 .  ondansetron (ZOFRAN) 4 MG tablet, Take 1 tablet (4 mg total) by mouth every 6 (six) hours as needed for nausea or vomiting., Disp: 20 tablet, Rfl: 0 .  oxybutynin (DITROPAN) 5 MG tablet, Take 1 tablet (5 mg total) by mouth daily for 30 days., Disp: 30 tablet, Rfl: 0 .   oxyCODONE (ROXICODONE) 5 MG immediate release tablet, Take 1 tablet (5 mg total) by mouth every 6 (six) hours as needed for up to 5 days for severe pain., Disp: 20 tablet, Rfl: 0 .  pantoprazole (PROTONIX) 40 MG tablet, Take 1 tablet (40 mg total) by mouth 2 (two) times daily. TAKE 1 TABLET(40 MG) BY MOUTH twice per day.  Before 1st and last meal of the day. (Patient taking differently: Take 40 mg by mouth 2 (two) times daily before a meal. ), Disp: 60 tablet, Rfl: 12 .  rosuvastatin (CRESTOR) 40 MG tablet, TAKE 1 TABLET BY MOUTH EVERY DAY (Patient taking differently: Take 40 mg by mouth daily. ), Disp: 90 tablet, Rfl: 0 .  sertraline (ZOLOFT) 100 MG tablet, TAKE 1 TABLET(100 MG) BY MOUTH DAILY (Patient taking differently: Take 100 mg by mouth daily. ), Disp: 90 tablet, Rfl: 0  Allergies  Allergen Reactions  . Anesthesia S-I-60 Other (See Comments)    Hallucinations.  Yvette Rack [Cyclobenzaprine] Other (See Comments)    Prolonged QTc to 571, tachycardia  . Soma [Carisoprodol] Itching and Rash    Objective: Vascular Examination: Capillary refill time <3 seconds.  Dorsalis pedis pulses palpable b/l.  Posterior tibial pulses palable b/l.  No digital hair x 10 digits.  Skin temperature WNL b/l.   Dermatological Examination: Skin thin, shiny and atrophic b/l  Hemorrhagic hyperkeratotic lesion noted submetatarsal head 5 b/l  with tenderness to palpation. No flocculence, no edema, no erythema, no underlying open wound. No warmth.   Postdebridement of right lesion submet head 5, reveals partial thickness ulceration with no erythema, no edema, no drainage, no flocculence. Measures 2.0 x 1.5 cm.  Small abrasion noted dorsal 3rd digit with visible granulation tissue. No erythema, no edema, no drainage, no flocculence.  Musculoskeletal: Muscle strength 5/5 to all LE muscle groups  Hallux amputation right foot  Neurological: Sensation intact with 10 gram  monofilament.  Assessment: 1. Partial thickness ulcer submet head 5 b/l right foot 2. Abrasion dorsal 3rd digit right foot 3. NIDDM with diabetic neuropathy 4. S/p hallux amputation right foot  Plan: 1. Continue diabetic foot care principles. Literature dispensed. 2. For abrasion right 3rd digit, digit was cleansed with wound cleanser and triple antibiotic ointment was applied. She was instructed to apply Neosporin Cream daily until resolved. Call the office if condition worsens. 3. Partial thickness ulcer pared submet head 5 right foot with sterile blade and gently smoothed with burr without incident. 4. Patient to break in her diabetic shoes as instructed. 5. Patient to report any pedal injuries to medical professional. 6. Follow up 4- 6 weeks.   7. Patient/POA to call should there be a concern in the interim.

## 2018-09-11 NOTE — Progress Notes (Signed)

## 2018-09-14 ENCOUNTER — Other Ambulatory Visit (INDEPENDENT_AMBULATORY_CARE_PROVIDER_SITE_OTHER): Payer: Medicare Other

## 2018-09-14 ENCOUNTER — Other Ambulatory Visit: Payer: Self-pay

## 2018-09-14 DIAGNOSIS — N179 Acute kidney failure, unspecified: Secondary | ICD-10-CM

## 2018-09-15 LAB — BMP8+ANION GAP
Anion Gap: 20 mmol/L — ABNORMAL HIGH (ref 10.0–18.0)
BUN/Creatinine Ratio: 16 (ref 12–28)
BUN: 17 mg/dL (ref 8–27)
CO2: 16 mmol/L — ABNORMAL LOW (ref 20–29)
Calcium: 9.2 mg/dL (ref 8.7–10.3)
Chloride: 103 mmol/L (ref 96–106)
Creatinine, Ser: 1.07 mg/dL — ABNORMAL HIGH (ref 0.57–1.00)
GFR calc Af Amer: 63 mL/min/{1.73_m2} (ref >59)
GFR calc non Af Amer: 55 mL/min/{1.73_m2} — ABNORMAL LOW (ref >59)
Glucose: 311 mg/dL — ABNORMAL HIGH (ref 65–99)
Potassium: 4.1 mmol/L (ref 3.5–5.2)
Sodium: 139 mmol/L (ref 134–144)

## 2018-09-17 DIAGNOSIS — N201 Calculus of ureter: Secondary | ICD-10-CM | POA: Diagnosis not present

## 2018-09-18 ENCOUNTER — Other Ambulatory Visit: Payer: Self-pay | Admitting: Urology

## 2018-09-19 ENCOUNTER — Encounter (HOSPITAL_COMMUNITY): Payer: Self-pay | Admitting: General Practice

## 2018-09-24 ENCOUNTER — Other Ambulatory Visit: Payer: Self-pay

## 2018-09-24 ENCOUNTER — Ambulatory Visit (HOSPITAL_COMMUNITY): Payer: Medicare Other

## 2018-09-24 ENCOUNTER — Encounter (HOSPITAL_COMMUNITY)
Admission: RE | Disposition: A | Discharge status: Home / Self Care | Payer: Self-pay | Source: Home / Self Care | Attending: Urology

## 2018-09-24 ENCOUNTER — Ambulatory Visit (HOSPITAL_COMMUNITY)
Admission: RE | Admit: 2018-09-24 | Discharge: 2018-09-24 | Disposition: A | Discharge status: Home / Self Care | Payer: Medicare Other | Source: Home / Self Care | Attending: Urology | Admitting: Urology

## 2018-09-24 ENCOUNTER — Encounter (HOSPITAL_COMMUNITY): Payer: Self-pay

## 2018-09-24 DIAGNOSIS — N390 Urinary tract infection, site not specified: Secondary | ICD-10-CM | POA: Diagnosis not present

## 2018-09-24 DIAGNOSIS — D62 Acute posthemorrhagic anemia: Secondary | ICD-10-CM | POA: Diagnosis not present

## 2018-09-24 DIAGNOSIS — J449 Chronic obstructive pulmonary disease, unspecified: Secondary | ICD-10-CM | POA: Insufficient documentation

## 2018-09-24 DIAGNOSIS — E119 Type 2 diabetes mellitus without complications: Secondary | ICD-10-CM | POA: Insufficient documentation

## 2018-09-24 DIAGNOSIS — N200 Calculus of kidney: Secondary | ICD-10-CM | POA: Insufficient documentation

## 2018-09-24 DIAGNOSIS — I252 Old myocardial infarction: Secondary | ICD-10-CM

## 2018-09-24 DIAGNOSIS — F17200 Nicotine dependence, unspecified, uncomplicated: Secondary | ICD-10-CM | POA: Insufficient documentation

## 2018-09-24 DIAGNOSIS — S37012A Minor contusion of left kidney, initial encounter: Secondary | ICD-10-CM | POA: Diagnosis not present

## 2018-09-24 DIAGNOSIS — N179 Acute kidney failure, unspecified: Secondary | ICD-10-CM | POA: Diagnosis not present

## 2018-09-24 DIAGNOSIS — Z66 Do not resuscitate: Secondary | ICD-10-CM | POA: Diagnosis not present

## 2018-09-24 DIAGNOSIS — R1032 Left lower quadrant pain: Secondary | ICD-10-CM | POA: Diagnosis not present

## 2018-09-24 DIAGNOSIS — N99840 Postprocedural hematoma of a genitourinary system organ or structure following a genitourinary system procedure: Secondary | ICD-10-CM | POA: Diagnosis not present

## 2018-09-24 HISTORY — PX: EXTRACORPOREAL SHOCK WAVE LITHOTRIPSY: SHX1557

## 2018-09-24 HISTORY — DX: Personal history of urinary calculi: Z87.442

## 2018-09-24 LAB — GLUCOSE, CAPILLARY: Glucose-Capillary: 153 mg/dL — ABNORMAL HIGH (ref 70–99)

## 2018-09-24 SURGERY — LITHOTRIPSY, ESWL
Anesthesia: LOCAL | Laterality: Left

## 2018-09-24 MED ORDER — DIAZEPAM 5 MG PO TABS
10.0000 mg | ORAL_TABLET | ORAL | Status: AC
  Administered 2018-09-24: 10 mg via ORAL
  Filled 2018-09-24: qty 2

## 2018-09-24 MED ORDER — CIPROFLOXACIN HCL 500 MG PO TABS
500.0000 mg | ORAL_TABLET | ORAL | Status: AC
  Administered 2018-09-24: 500 mg via ORAL
  Filled 2018-09-24: qty 1

## 2018-09-24 MED ORDER — OXYCODONE-ACETAMINOPHEN 5-325 MG PO TABS
ORAL_TABLET | ORAL | Status: AC
  Filled 2018-09-24: qty 1

## 2018-09-24 MED ORDER — SODIUM CHLORIDE 0.9 % IV SOLN
INTRAVENOUS | Status: DC
  Administered 2018-09-24: 07:00:00 via INTRAVENOUS

## 2018-09-24 MED ORDER — DIPHENHYDRAMINE HCL 25 MG PO CAPS
25.0000 mg | ORAL_CAPSULE | ORAL | Status: AC
  Administered 2018-09-24: 25 mg via ORAL
  Filled 2018-09-24: qty 1

## 2018-09-24 MED ORDER — OXYCODONE-ACETAMINOPHEN 5-325 MG PO TABS
1.0000 | ORAL_TABLET | Freq: Once | ORAL | Status: AC
  Administered 2018-09-24: 1 via ORAL

## 2018-09-24 MED ORDER — DIAZEPAM 5 MG PO TABS
5.0000 mg | ORAL_TABLET | ORAL | Status: DC
  Filled 2018-09-24: qty 1

## 2018-09-24 NOTE — Discharge Instructions (Signed)

## 2018-09-24 NOTE — Progress Notes (Signed)
Follow up orders received from Dr. Alyson Ingles, pt does not have to void prior to discharge.

## 2018-09-24 NOTE — Progress Notes (Signed)
Pt discharged by wheelchair home with her daughter. All belongings returned. Discharge instruction handout given to pt, reviewed with daughter [name]. All questions answered.

## 2018-09-24 NOTE — Progress Notes (Signed)
Notified Dr. Noah Delaine pt unable to void and having increased pain. Awaiting response from page.

## 2018-09-25 ENCOUNTER — Other Ambulatory Visit: Payer: Self-pay

## 2018-09-25 ENCOUNTER — Telehealth: Payer: Self-pay | Admitting: Internal Medicine

## 2018-09-25 ENCOUNTER — Inpatient Hospital Stay (HOSPITAL_COMMUNITY)
Admission: EM | Admit: 2018-09-25 | Discharge: 2018-10-02 | DRG: 920 | Disposition: A | Discharge status: Home / Self Care | Payer: Medicare Other | Attending: Internal Medicine | Admitting: Internal Medicine

## 2018-09-25 ENCOUNTER — Emergency Department (HOSPITAL_COMMUNITY): Payer: Medicare Other

## 2018-09-25 ENCOUNTER — Encounter (HOSPITAL_COMMUNITY): Payer: Self-pay | Admitting: Urology

## 2018-09-25 DIAGNOSIS — N179 Acute kidney failure, unspecified: Secondary | ICD-10-CM | POA: Diagnosis not present

## 2018-09-25 DIAGNOSIS — D62 Acute posthemorrhagic anemia: Secondary | ICD-10-CM | POA: Diagnosis present

## 2018-09-25 DIAGNOSIS — R1032 Left lower quadrant pain: Secondary | ICD-10-CM | POA: Diagnosis not present

## 2018-09-25 DIAGNOSIS — S37012A Minor contusion of left kidney, initial encounter: Secondary | ICD-10-CM

## 2018-09-25 DIAGNOSIS — S37019A Minor contusion of unspecified kidney, initial encounter: Secondary | ICD-10-CM | POA: Diagnosis present

## 2018-09-25 DIAGNOSIS — E118 Type 2 diabetes mellitus with unspecified complications: Secondary | ICD-10-CM | POA: Diagnosis present

## 2018-09-25 DIAGNOSIS — K219 Gastro-esophageal reflux disease without esophagitis: Secondary | ICD-10-CM | POA: Diagnosis present

## 2018-09-25 DIAGNOSIS — Z8349 Family history of other endocrine, nutritional and metabolic diseases: Secondary | ICD-10-CM

## 2018-09-25 DIAGNOSIS — G43909 Migraine, unspecified, not intractable, without status migrainosus: Secondary | ICD-10-CM | POA: Diagnosis not present

## 2018-09-25 DIAGNOSIS — Z66 Do not resuscitate: Secondary | ICD-10-CM | POA: Diagnosis present

## 2018-09-25 DIAGNOSIS — Z888 Allergy status to other drugs, medicaments and biological substances status: Secondary | ICD-10-CM

## 2018-09-25 DIAGNOSIS — Z7984 Long term (current) use of oral hypoglycemic drugs: Secondary | ICD-10-CM

## 2018-09-25 DIAGNOSIS — Z96652 Presence of left artificial knee joint: Secondary | ICD-10-CM | POA: Diagnosis present

## 2018-09-25 DIAGNOSIS — I10 Essential (primary) hypertension: Secondary | ICD-10-CM | POA: Diagnosis present

## 2018-09-25 DIAGNOSIS — Z8673 Personal history of transient ischemic attack (TIA), and cerebral infarction without residual deficits: Secondary | ICD-10-CM

## 2018-09-25 DIAGNOSIS — R32 Unspecified urinary incontinence: Secondary | ICD-10-CM | POA: Diagnosis present

## 2018-09-25 DIAGNOSIS — Z89421 Acquired absence of other right toe(s): Secondary | ICD-10-CM

## 2018-09-25 DIAGNOSIS — N99840 Postprocedural hematoma of a genitourinary system organ or structure following a genitourinary system procedure: Principal | ICD-10-CM | POA: Diagnosis present

## 2018-09-25 DIAGNOSIS — E876 Hypokalemia: Secondary | ICD-10-CM | POA: Diagnosis not present

## 2018-09-25 DIAGNOSIS — Z79891 Long term (current) use of opiate analgesic: Secondary | ICD-10-CM

## 2018-09-25 DIAGNOSIS — F329 Major depressive disorder, single episode, unspecified: Secondary | ICD-10-CM | POA: Diagnosis present

## 2018-09-25 DIAGNOSIS — Z79899 Other long term (current) drug therapy: Secondary | ICD-10-CM

## 2018-09-25 DIAGNOSIS — F17210 Nicotine dependence, cigarettes, uncomplicated: Secondary | ICD-10-CM | POA: Diagnosis present

## 2018-09-25 DIAGNOSIS — N390 Urinary tract infection, site not specified: Secondary | ICD-10-CM | POA: Diagnosis present

## 2018-09-25 DIAGNOSIS — Z87442 Personal history of urinary calculi: Secondary | ICD-10-CM

## 2018-09-25 DIAGNOSIS — F419 Anxiety disorder, unspecified: Secondary | ICD-10-CM | POA: Diagnosis present

## 2018-09-25 DIAGNOSIS — Y733 Surgical instruments, materials and gastroenterology and urology devices (including sutures) associated with adverse incidents: Secondary | ICD-10-CM | POA: Diagnosis present

## 2018-09-25 DIAGNOSIS — E785 Hyperlipidemia, unspecified: Secondary | ICD-10-CM | POA: Diagnosis present

## 2018-09-25 DIAGNOSIS — Z8249 Family history of ischemic heart disease and other diseases of the circulatory system: Secondary | ICD-10-CM

## 2018-09-25 DIAGNOSIS — J449 Chronic obstructive pulmonary disease, unspecified: Secondary | ICD-10-CM | POA: Diagnosis present

## 2018-09-25 LAB — CBC
HCT: 31.9 % — ABNORMAL LOW (ref 36.0–46.0)
Hemoglobin: 9.5 g/dL — ABNORMAL LOW (ref 12.0–15.0)
MCH: 31.1 pg (ref 26.0–34.0)
MCHC: 29.8 g/dL — ABNORMAL LOW (ref 30.0–36.0)
MCV: 104.6 fL — ABNORMAL HIGH (ref 80.0–100.0)
Platelets: 134 10*3/uL — ABNORMAL LOW (ref 150–400)
RBC: 3.05 MIL/uL — ABNORMAL LOW (ref 3.87–5.11)
RDW: 15.3 % (ref 11.5–15.5)
WBC: 9.1 10*3/uL (ref 4.0–10.5)
nRBC: 0 % (ref 0.0–0.2)

## 2018-09-25 LAB — BASIC METABOLIC PANEL
Anion gap: 9 (ref 5–15)
BUN: 33 mg/dL — ABNORMAL HIGH (ref 8–23)
CO2: 17 mmol/L — ABNORMAL LOW (ref 22–32)
Calcium: 8.7 mg/dL — ABNORMAL LOW (ref 8.9–10.3)
Chloride: 110 mmol/L (ref 98–111)
Creatinine, Ser: 2.62 mg/dL — ABNORMAL HIGH (ref 0.44–1.00)
GFR calc Af Amer: 21 mL/min — ABNORMAL LOW (ref >60)
GFR calc non Af Amer: 18 mL/min — ABNORMAL LOW (ref >60)
Glucose, Bld: 152 mg/dL — ABNORMAL HIGH (ref 70–99)
Potassium: 4.4 mmol/L (ref 3.5–5.1)
Sodium: 136 mmol/L (ref 135–145)

## 2018-09-25 MED ORDER — SODIUM CHLORIDE 0.9 % IV SOLN
1000.0000 mL | INTRAVENOUS | Status: DC
  Administered 2018-09-26 – 2018-09-29 (×4): 1000 mL via INTRAVENOUS

## 2018-09-25 MED ORDER — HYDROMORPHONE HCL 1 MG/ML IJ SOLN
0.5000 mg | Freq: Once | INTRAMUSCULAR | Status: AC
  Administered 2018-09-25: 21:00:00 0.5 mg via INTRAVENOUS
  Filled 2018-09-25: qty 1

## 2018-09-25 MED ORDER — ONDANSETRON HCL 4 MG/2ML IJ SOLN
4.0000 mg | Freq: Once | INTRAMUSCULAR | Status: AC
  Administered 2018-09-25: 21:00:00 4 mg via INTRAVENOUS
  Filled 2018-09-25: qty 2

## 2018-09-25 MED ORDER — SODIUM CHLORIDE 0.9 % IV BOLUS (SEPSIS)
500.0000 mL | Freq: Once | INTRAVENOUS | Status: AC
  Administered 2018-09-25: 21:00:00 500 mL via INTRAVENOUS

## 2018-09-25 NOTE — Consult Note (Signed)
I have been asked to see the patient by Dr. Dorie Rank, for evaluation and management of left perinephric hematoma.  History of present illness: [name] who was recently admitted with UTI and obstructing stone, and upon that admission was stented by Dr. Diona Fanti.  She was treated for her infection and then subsequently underwent shockwave lithotripsy yesterday for her 79mm left proximal ureteral stone.  She presented to the ED with weakness, nausea/vomitting and left flank pain.  She was noted to have an elevated creatinine and fairly large drop in her hgb.  CT scan demonstrated a large left peri-nephric hematoma.  Urology was consulted for further eval.  The patient was admitted to the medicine service for rehydration for her ARF, better pain control, and bedrest until her hgb stablizes (q6 h/h).     This AM the patient is complaining of left abdominal pain.  She is no longer having nausea and is eating breakfast.  She is "unable" to really walk in her assessment.  She has now voided once since admission.   Review of systems: A 12 point comprehensive review of systems was obtained and is negative unless otherwise stated in the history of present illness.  Patient Active Problem List   Diagnosis Date Noted  . Nephrolithiasis 09/06/2018  . Vomiting 07/21/2018  . Post concussive syndrome 06/20/2018  . Diabetes mellitus treated with oral medication ([place])   . Head trauma 06/18/2018  . Intractable vomiting with nausea 06/18/2018  . Facial trauma, initial encounter 06/18/2018  . Pain in joint of left shoulder 05/15/2018  . Lip laceration   . Iron deficiency anemia due to chronic blood loss 01/05/2018  . Symptomatic anemia 11/07/2017  . Pressure injury of skin 11/07/2017  . Acute gastric ulcer with hemorrhage   . Pain in left knee 09/12/2017  . Healthcare maintenance 09/09/2017  . Allergic rhinitis 09/09/2017  . Major depressive disorder 07/21/2017  . Dysuria 06/16/2017  . Demand ischemia ([place])  06/16/2017  . Petechiae 06/16/2017  . AKI (acute kidney injury) ([place])   . Right foot pain 04/22/2017  . Blister (nonthermal) of right ring finger, initial encounter 01/21/2017  . History of total knee replacement, 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  . Neck muscle strain 08/19/2016  . Onychomycosis of multiple toenails with type 2 diabetes mellitus ([name]) 11/04/2015  . Skin lesions 11/04/2015  . Syncope   . Overweight (BMI 25.0-29.9) 05/27/2015  . Leg ulcer, left ([place]) 03/05/2015  . Recurrent falls 03/05/2015  . Primary osteoarthritis of left knee 09/22/2014  . Esophageal reflux 07/18/2014  . Hyperlipidemia associated with type 2 diabetes mellitus ([place]) 02/26/2014  . Tobacco use disorder 07/22/2013  . Headache 06/28/2013  . Diabetic peripheral neuropathy associated with type 2 diabetes mellitus ([place]) 06/14/2013  . Hx-TIA (transient ischemic attack) 03/10/2013  . Diabetes mellitus type 2, controlled, with complications ([place]) 63/87/5643  . Hypertension 05/13/2012    No current facility-administered medications on file prior to encounter.    Current Outpatient Medications on File Prior to Encounter  Medication Sig Dispense Refill  . albuterol (PROVENTIL HFA;VENTOLIN HFA) 108 (90 Base) MCG/ACT inhaler Inhale 1-2 puffs into the lungs every 6 (six) hours as needed for wheezing or shortness of breath. 1 Inhaler 5  . gabapentin (NEURONTIN) 400 MG capsule TAKE 1 CAPSULE BY MOUTH THREE TIMES DAILY (Patient taking differently: Take 400 mg by mouth 3 (three) times daily. ) 90 capsule 1  . JANUVIA 50 MG tablet TAKE 1 TABLET(50 MG) BY MOUTH DAILY (Patient taking differently: Take 50 mg by  mouth daily. ) 90 tablet 0  . metFORMIN (GLUMETZA) 1000 MG (MOD) 24 hr tablet Take 1 tablet (1,000 mg total) by mouth 2 (two) times daily with a meal. 180 tablet 0  . oxybutynin (DITROPAN) 5 MG tablet Take 1 tablet (5 mg total) by mouth daily for 30 days. 30 tablet 0  . oxyCODONE-acetaminophen (PERCOCET/ROXICET) 5-325 MG tablet Take  1 tablet by mouth every 6 (six) hours as needed for severe pain.    . pantoprazole (PROTONIX) 40 MG tablet Take 1 tablet (40 mg total) by mouth 2 (two) times daily. TAKE 1 TABLET(40 MG) BY MOUTH twice per day.  Before 1st and last meal of the day. (Patient taking differently: Take 40 mg by mouth 2 (two) times daily before a meal. ) 60 tablet 12  . rosuvastatin (CRESTOR) 40 MG tablet TAKE 1 TABLET BY MOUTH EVERY DAY (Patient taking differently: Take 40 mg by mouth daily. ) 90 tablet 0  . sertraline (ZOLOFT) 100 MG tablet TAKE 1 TABLET(100 MG) BY MOUTH DAILY (Patient taking differently: Take 100 mg by mouth daily. ) 90 tablet 0  . ondansetron (ZOFRAN) 4 MG tablet Take 1 tablet (4 mg total) by mouth every 6 (six) hours as needed for nausea or vomiting. 20 tablet 0    Past Medical History:  Diagnosis Date  . Allergy   . Anemia   . Arthritis    "left knee" (09/08/2015)  . Asthma   . Blood transfusion without reported diagnosis 10/2017   anemic  . Brachial plexus disorders   . CHF (congestive heart failure) ([place])   . Chronic pain    neck pain, headache, neuropathy  . COPD (chronic obstructive pulmonary disease) ([place])    SEES ONLY DR. PATEL -   . Depression    "when my husband passed in 2013"  . Facial trauma 05/2018  . Fall 04/19/2018  . Gastric ulcer   . GERD (gastroesophageal reflux disease)   . History of kidney stones   . Hypertension   . Hypertriglyceridemia   . Migraine    "monthly" (09/08/2015)  . Neuromuscular disorder ([facility])    neuropathy  . Puncture wound of foot, right 05/17/2012   Tetanus shot 3 yrs ago at [facility] in [place], per pt report   . Stroke ([place])    TIAS    IN [place]   4 YRS AGO   . Type II diabetes mellitus ([place]) 2010   diagnosed around 2010, only ever on metformin    Past Surgical History:  Procedure Laterality Date  . 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 ARTHRODESIS METATARSAL     RIGHT 5 TH   . BILATERAL SALPINGOOPHORECTOMY Bilateral 1986   "after hemtoma evacuations; had to cut me open"  . BIOPSY  11/07/2017   Procedure: BIOPSY;  Surgeon: Irene Shipper, MD;  Location: [facility] ENDOSCOPY;  Service: Endoscopy;;  . CARPAL TUNNEL RELEASE Bilateral   . COLONOSCOPY     10 + yrs ago- pt unsure- was in [place]- pt states  MD will not send records but colon was normal per pt.   . CYSTOSCOPY W/ URETERAL STENT PLACEMENT Left 09/06/2018   Procedure: CYSTOSCOPY WITH LEFT RETROGRADE PYELOGRAM/URETERAL LEFT DOUBLE J STENT PLACEMENT;  Surgeon: Franchot Gallo, MD;  Location: [facility];  Service: Urology;  Laterality: Left;  . DILATION AND CURETTAGE OF UTERUS  ~  1982   S/P miscarriage  . ESOPHAGOGASTRODUODENOSCOPY (EGD) WITH PROPOFOL N/A 11/07/2017   Procedure: ESOPHAGOGASTRODUODENOSCOPY (EGD) WITH PROPOFOL;  Surgeon: Irene Shipper, MD;  Location[facility]COPY;  Service: Endoscopy;  Laterality: N/A;  . EXTRACORPOREAL SHOCK WAVE LITHOTRIPSY Left 09/24/2018   Procedure: EXTRACORPOREAL SHOCK WAVE LITHOTRIPSY (ESWL);  Surgeon: Cleon Gustin, MD;  Location: [facility] ORS;  Service: Urology;  Laterality: Left;  . HEMATOMA EVACUATION Right 1986 x 2   "OVARY; w/in 1 wk after hysterectomy"  . KNEE ARTHROSCOPY     LEFT  . LAPAROSCOPIC CHOLECYSTECTOMY    . SHOULDER ARTHROSCOPY W/ ROTATOR CUFF REPAIR Bilateral   . TONSILLECTOMY    . TOTAL KNEE ARTHROPLASTY Left 09/30/2016   Procedure: LEFT TOTAL KNEE ARTHROPLASTY;  Surgeon: Netta Cedars, MD;  Location: [facility];  Service: Orthopedics;  Laterality: Left;  . TUBAL LIGATION    . VAGINAL HYSTERECTOMY  1986    Social History   Tobacco Use  . Smoking status: Current Every Day Smoker    Packs/day: 0.30    Years: 49.00    Pack years: 14.70    Types: Cigarettes  . Smokeless tobacco: Never Used  . Tobacco comment: 4-5 cigs per day  Substance Use Topics  . Alcohol use: No    Alcohol/week:  0.0 standard drinks  . Drug use: No    Family History  Problem Relation Age of Onset  . Hyperlipidemia Mother   . Heart attack Father 20  . Hypertension Father   . Cancer Paternal Grandfather        Lung cancer  . Cancer Maternal Grandmother   . Heart attack Maternal Grandmother   . Colon cancer Neg Hx   . Colon polyps Neg Hx   . Esophageal cancer Neg Hx   . Rectal cancer Neg Hx   . Stomach cancer Neg Hx     PE: Vitals:   09/26/18 0047 09/26/18 0115 09/26/18 0237 09/26/18 0335  BP: 106/71 102/66 100/81 (!) 110/56  Pulse: 84 100 79   Resp:   18   Temp:   98.7 F (37.1 C) 98.4 F (36.9 C)  TempSrc:   Oral Oral  SpO2: 95% 94% 94%    Patient appears to be in no acute distress  patient is alert and oriented x3 Atraumatic normocephalic head No cervical or supraclavicular lymphadenopathy appreciated No increased work of breathing, no audible wheezes/rhonchi Regular sinus rhythm/rate Abdomen is soft, with significant left abdominal pain, severe left CVA tenderness with ecchymosis over the left flank (from shock head of lithotriptor) Lower extremities are symmetric without appreciable edema Grossly neurologically intact No identifiable skin lesions  Recent Labs    09/25/18 2038 09/26/18 0324  WBC 9.1 8.5  HGB 9.5* 9.7*  HCT 31.9* 32.1*   Recent Labs    09/25/18 2038  NA 136  K 4.4  CL 110  CO2 17*  GLUCOSE 152*  BUN 33*  CREATININE 2.62*  CALCIUM 8.7*   No results for input(s): LABPT, INR in the last 72 hours. No results for input(s): LABURIN in the last 72 hours. Results for orders placed or performed during the hospital encounter of 09/05/18  Culture, Urine     Status: None   Collection Time: 09/06/18  7:34 AM  Result Value Ref Range Status   Specimen Description URINE, RANDOM  Final   Special Requests NONE  Final   Culture   Final    NO GROWTH Performed at [facility], [address],  [place] [address]    Report Status 09/07/2018  FINAL  Final  Respiratory Panel by PCR     Status: None   Collection Time: 09/06/18  8:27 AM  Result Value Ref Range Status   Adenovirus NOT DETECTED NOT DETECTED Final   Coronavirus 229E NOT DETECTED NOT DETECTED Final    Comment: (NOTE) The Coronavirus on the Respiratory Panel, DOES NOT test for the novel  Coronavirus (2019 nCoV)    Coronavirus HKU1 NOT DETECTED NOT DETECTED Final   Coronavirus NL63 NOT DETECTED NOT DETECTED Final   Coronavirus OC43 NOT DETECTED NOT DETECTED Final   Metapneumovirus NOT DETECTED NOT DETECTED Final   Rhinovirus / Enterovirus NOT DETECTED NOT DETECTED Final   Influenza A NOT DETECTED NOT DETECTED Final   Influenza B NOT DETECTED NOT DETECTED Final   Parainfluenza Virus 1 NOT DETECTED NOT DETECTED Final   Parainfluenza Virus 2 NOT DETECTED NOT DETECTED Final   Parainfluenza Virus 3 NOT DETECTED NOT DETECTED Final   Parainfluenza Virus 4 NOT DETECTED NOT DETECTED Final   Respiratory Syncytial Virus NOT DETECTED NOT DETECTED Final   Bordetella pertussis NOT DETECTED NOT DETECTED Final   Chlamydophila pneumoniae NOT DETECTED NOT DETECTED Final   Mycoplasma pneumoniae NOT DETECTED NOT DETECTED Final    Comment: Performed at [facility], [address] [place], [place] [address]    Imaging: I have independently reviewed her non-con A/P CT scan showing a moderate left peri-nephric hematoma.  Stent in good position, no hydronephrosis, small stone fragments in the left lower pole.  Imp: Admitted with moderate peri-nephric hematoma, acute renal failure and poorly controlled flank pain.  Since admission her hemoglobin appears to be stable suggesting no on-going bleeding from her left kidney.  Her pain is better controlled with stronger narcotic pain medication.  Her creatinine is pending.    Recommendations: If her hemoglobin is stable over the next blood draw, she can eat and her activity liberalized.  She should be transitioned to oral pain medication  and a regimen worked out that will adequately control her pain at home.  Once her renal function stabilizes/improves, along with the above she should be able to be discharged, which I suspect would be within the next 24 hours.  She has follow-up already scheduled in our office.  Thank you for involving me in this patient's care, we will continue to follow along.  Ardis Hughs

## 2018-09-25 NOTE — Consult Note (Signed)
Patient recently admitted with UTI and obstructing stone, and upon that admission was stented by Dr. Diona Fanti.  She was treated for her infection and then subsequently underwent shockwave lithotripsy yesterday for her 65mm left proximal ureteral stone.  She presented to the ED with weakness, nausea/vomitting and left flank pain.  She was noted to have an elevated creatinine and fairly large drop in her hgb.  CT scan demonstrated a large peri-nephric hematoma.  Recommended that the patient be admitted for rehydration for her ARF, better pain control, and bedrest until her hgb stablizes (q6 h/h).  I will officially consult in the early morning.  Please call or page me directly with questions tonight. [phone]

## 2018-09-25 NOTE — ED Triage Notes (Signed)
Patient reports kidney stone procedure yesterday. C/o N/V, weakness, and decreased urinary output since that time.

## 2018-09-25 NOTE — ED Notes (Signed)
Patient transported to CT 

## 2018-09-25 NOTE — ED Provider Notes (Signed)
[facility] DEPT Provider Note   CSN: [id number] Arrival date & time: 09/25/18  2001    History   Chief Complaint Chief Complaint  Patient presents with   Flank Pain    HPI [name] is a 68 y.o. female.     HPI Patient presents ED for evaluation of nausea vomiting weakness and pain after recent lithotripsy procedure.  Patient had a procedure yesterday.  She states since then she has been feeling poorly.  She has had generalized malaise.  He has abdominal and flank pain.  She has nausea vomiting, 3 episodes today.  Patient states she is only been able to keep down 700 cc of fluid.  She has had decreased urine output.  Patient has been taking her oxycodone and Zofran.  She called the urologist and was told to come to the ED. Past Medical History:  Diagnosis Date   Allergy    Anemia    Arthritis    "left knee" (09/08/2015)   Asthma    Blood transfusion without reported diagnosis 10/2017   anemic   Brachial plexus disorders    CHF (congestive heart failure) (HCC)    Chronic pain    neck pain, headache, neuropathy   COPD (chronic obstructive pulmonary disease) ([place])    SEES ONLY DR. PATEL -    Depression    "when my husband passed in 2013"   Facial trauma 05/2018   Fall 04/19/2018   Gastric ulcer    GERD (gastroesophageal reflux disease)    History of kidney stones    Hypertension    Hypertriglyceridemia    Migraine    "monthly" (09/08/2015)   Neuromuscular disorder ([place])    neuropathy   Puncture wound of foot, right 05/17/2012   Tetanus shot 3 yrs ago at [facility] in [place], per pt report    Stroke ([place])    [place]   4 YRS AGO    Type II diabetes mellitus ([place]) 2010   diagnosed around 2010, only ever on metformin    Patient Active Problem List   Diagnosis Date Noted   Nephrolithiasis 09/06/2018   Vomiting 07/21/2018   Post concussive syndrome 06/20/2018   Diabetes mellitus  treated with oral medication ([place])    Head trauma 06/18/2018   Intractable vomiting with nausea 06/18/2018   Facial trauma, initial encounter 06/18/2018   Pain in joint of left shoulder 05/15/2018   Lip laceration    Iron deficiency anemia due to chronic blood loss 01/05/2018   Symptomatic anemia 11/07/2017   Pressure injury of skin 11/07/2017   Acute gastric ulcer with hemorrhage    Pain in left knee 09/12/2017   Healthcare maintenance 09/09/2017   Allergic rhinitis 09/09/2017   Major depressive disorder 07/21/2017   Dysuria 06/16/2017   Demand ischemia ([place]) 06/16/2017   Petechiae 06/16/2017   AKI (acute kidney injury) ([place])    Right foot pain 04/22/2017   Blister (nonthermal) of right ring finger, initial encounter 01/21/2017   History of total knee replacement, 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   Neck muscle strain 08/19/2016   Onychomycosis of multiple toenails with type 2 diabetes mellitus ([place]) 11/04/2015   Skin lesions 11/04/2015   Syncope    Overweight (BMI 25.0-29.9) 05/27/2015   Leg ulcer, left ([place]) 03/05/2015   Recurrent falls 03/05/2015   Primary osteoarthritis of left knee 09/22/2014   Esophageal reflux 07/18/2014   Hyperlipidemia associated with type 2 diabetes mellitus ([place]) 02/26/2014   Tobacco use  disorder 07/22/2013   Headache 06/28/2013   Diabetic peripheral neuropathy associated with type 2 diabetes mellitus ([place]) 06/14/2013   Hx-TIA (transient ischemic attack) 03/10/2013   Diabetes mellitus type 2, controlled, with complications ([place]) 19/37/9024   Hypertension 05/13/2012    Past Surgical History:  Procedure Laterality Date   AMPUTATION Right 05/01/2015   Procedure: Right Foot 1st Ray Amputation;  Surgeon: Newt Minion, MD;  Location: [facility];  Service: Orthopedics;  Laterality: Right;   ARTHRODESIS METATARSAL     RIGHT 5 TH    BILATERAL SALPINGOOPHORECTOMY Bilateral 1986   "after hemtoma evacuations; had to cut me open"    BIOPSY  11/07/2017   Procedure: BIOPSY;  Surgeon: Irene Shipper, MD;  Location: [facility] ENDOSCOPY;  Service: Endoscopy;;   CARPAL TUNNEL RELEASE Bilateral    COLONOSCOPY     10 + yrs ago- pt unsure- was in [place]- pt states  MD will not send records but colon was normal per pt.    CYSTOSCOPY W/ URETERAL STENT PLACEMENT Left 09/06/2018   Procedure: CYSTOSCOPY WITH LEFT RETROGRADE PYELOGRAM/URETERAL LEFT DOUBLE J STENT PLACEMENT;  Surgeon: Franchot Gallo, MD;  Location: [facility];  Service: Urology;  Laterality: Left;   DILATION AND CURETTAGE OF UTERUS  ~ 1982   S/P miscarriage   ESOPHAGOGASTRODUODENOSCOPY (EGD) WITH PROPOFOL N/A 11/07/2017   Procedure: ESOPHAGOGASTRODUODENOSCOPY (EGD) WITH PROPOFOL;  Surgeon: Irene Shipper, MD;  Location: [facility] ENDOSCOPY;  Service: Endoscopy;  Laterality: N/A;   EXTRACORPOREAL SHOCK WAVE LITHOTRIPSY Left 09/24/2018   Procedure: EXTRACORPOREAL SHOCK WAVE LITHOTRIPSY (ESWL);  Surgeon: Cleon Gustin, MD;  Location: [facility] ORS;  Service: Urology;  Laterality: Left;   HEMATOMA EVACUATION Right 1986 x 2   "OVARY; w/in 1 wk after hysterectomy"   KNEE ARTHROSCOPY     LEFT   LAPAROSCOPIC CHOLECYSTECTOMY     SHOULDER ARTHROSCOPY W/ ROTATOR CUFF REPAIR Bilateral    TONSILLECTOMY     TOTAL KNEE ARTHROPLASTY Left 09/30/2016   Procedure: LEFT TOTAL KNEE ARTHROPLASTY;  Surgeon: Netta Cedars, MD;  Location: [facility];  Service: Orthopedics;  Laterality: Left;   TUBAL LIGATION     VAGINAL HYSTERECTOMY  1986     OB History   No obstetric history on file.      Home Medications    Prior to Admission medications   Medication Sig Start Date End Date Taking? Authorizing Provider  albuterol (PROVENTIL HFA;VENTOLIN HFA) 108 (90 Base) MCG/ACT inhaler Inhale 1-2 puffs into the lungs every 6 (six) hours as needed for wheezing or shortness of breath. 02/10/17  Yes Lucious Groves, DO  gabapentin (NEURONTIN) 400 MG capsule TAKE 1 CAPSULE BY MOUTH THREE TIMES DAILY Patient  taking differently: Take 400 mg by mouth 3 (three) times daily.  08/18/18  Yes Chundi, Vahini, MD  JANUVIA 50 MG tablet TAKE 1 TABLET(50 MG) BY MOUTH DAILY Patient taking differently: Take 50 mg by mouth daily.  09/04/18  Yes Chundi, Verne Spurr, MD  metFORMIN (GLUMETZA) 1000 MG (MOD) 24 hr tablet Take 1 tablet (1,000 mg total) by mouth 2 (two) times daily with a meal. 09/04/18 12/03/18 Yes Chundi, Vahini, MD  oxybutynin (DITROPAN) 5 MG tablet Take 1 tablet (5 mg total) by mouth daily for 30 days. 09/07/18 10/07/18 Yes Chundi, Vahini, MD  oxyCODONE-acetaminophen (PERCOCET/ROXICET) 5-325 MG tablet Take 1 tablet by mouth every 6 (six) hours as needed for severe pain.   Yes [provider]  pantoprazole (PROTONIX) 40 MG tablet Take 1 tablet (40 mg total) by mouth 2 (  two) times daily. TAKE 1 TABLET(40 MG) BY MOUTH twice per day.  Before 1st and last meal of the day. Patient taking differently: Take 40 mg by mouth 2 (two) times daily before a meal.  01/26/18  Yes Pyrtle, Lajuan Lines, MD  rosuvastatin (CRESTOR) 40 MG tablet TAKE 1 TABLET BY MOUTH EVERY DAY Patient taking differently: Take 40 mg by mouth daily.  09/04/18  Yes Chundi, Vahini, MD  sertraline (ZOLOFT) 100 MG tablet TAKE 1 TABLET(100 MG) BY MOUTH DAILY Patient taking differently: Take 100 mg by mouth daily.  09/04/18  Yes Chundi, Vahini, MD  ondansetron (ZOFRAN) 4 MG tablet Take 1 tablet (4 mg total) by mouth every 6 (six) hours as needed for nausea or vomiting. 06/21/18   Dorrell, Andree Elk, MD    Family History Family History  Problem Relation Age of Onset   Hyperlipidemia Mother    Heart attack Father 70   Hypertension Father    Cancer Paternal Grandfather        Lung cancer   Cancer Maternal Grandmother    Heart attack Maternal Grandmother    Colon cancer Neg Hx    Colon polyps Neg Hx    Esophageal cancer Neg Hx    Rectal cancer Neg Hx    Stomach cancer Neg Hx     Social History Social History   Tobacco Use   Smoking  status: Current Every Day Smoker    Packs/day: 0.30    Years: 49.00    Pack years: 14.70    Types: Cigarettes   Smokeless tobacco: Never Used   Tobacco comment: 4-5 cigs per day  Substance Use Topics   Alcohol use: No    Alcohol/week: 0.0 standard drinks   Drug use: No     Allergies   Anesthesia s-i-60; Flexeril [cyclobenzaprine]; and Soma [carisoprodol]   Review of Systems Review of Systems  All other systems reviewed and are negative.    Physical Exam Updated Vital Signs BP 111/69    Pulse 90    Temp 98.1 F (36.7 C)    Resp 20    SpO2 96%   Physical Exam Vitals signs and nursing note reviewed.  Constitutional:      Appearance: She is well-developed. She is ill-appearing.     Comments: Appears older than her age  HENT:     Head: Normocephalic and atraumatic.     Right Ear: External ear normal.     Left Ear: External ear normal.  Eyes:     General: No scleral icterus.       Right eye: No discharge.        Left eye: No discharge.     Conjunctiva/sclera: Conjunctivae normal.  Neck:     Musculoskeletal: Neck supple.     Trachea: No tracheal deviation.  Cardiovascular:     Rate and Rhythm: Normal rate and regular rhythm.  Pulmonary:     Effort: Pulmonary effort is normal. No respiratory distress.     Breath sounds: Normal breath sounds. No stridor. No wheezing or rales.  Abdominal:     General: Bowel sounds are normal. There is no distension.     Palpations: Abdomen is soft.     Tenderness: There is generalized abdominal tenderness. There is no guarding or rebound.  Musculoskeletal:        General: No tenderness.  Skin:    General: Skin is warm and dry.     Findings: No rash.  Neurological:     Mental Status:  She is alert.     Cranial Nerves: No cranial nerve deficit (no facial droop, extraocular movements intact, no slurred speech).     Sensory: No sensory deficit.     Motor: No abnormal muscle tone or seizure activity.     Coordination: Coordination  normal.      ED Treatments / Results  Labs (all labs ordered are listed, but only abnormal results are displayed) Labs Reviewed  CBC - Abnormal; Notable for the following components:      Result Value   RBC 3.05 (*)    Hemoglobin 9.5 (*)    HCT 31.9 (*)    MCV 104.6 (*)    MCHC 29.8 (*)    Platelets 134 (*)    All other components within normal limits  BASIC METABOLIC PANEL - Abnormal; Notable for the following components:   CO2 17 (*)    Glucose, Bld 152 (*)    BUN 33 (*)    Creatinine, Ser 2.62 (*)    Calcium 8.7 (*)    GFR calc non Af Amer 18 (*)    GFR calc Af Amer 21 (*)    All other components within normal limits  URINALYSIS, ROUTINE W REFLEX MICROSCOPIC    Radiology Dg Abd 1 View  Result Date: 09/24/2018 CLINICAL DATA:  Lithotripsy for left renal stone. EXAM: ABDOMEN - 1 VIEW COMPARISON:  CT abdomen and pelvis - 09/05/2018 FINDINGS: Interval placement of left-sided double-J ureteral stent with superior coil overlying expected location of left renal pelvis and inferior coil overlying expected location of the urinary bladder. There is a punctate (approximately 0.7 cm) apparent stone overlying expected location of the right renal pelvis. Punctate phleboliths overlies the right hemipelvis, unchanged. Vascular calcifications overlie the right hemipelvis. No definitive abnormal opacities overlies expected location of the right renal fossa, ureter or the urinary bladder. Nonobstructive bowel gas pattern. Post cholecystectomy. Moderate scoliotic curvature of the thoracolumbar spine with associated mild to moderate multilevel lumbar spine DDD. IMPRESSION: 1. Interval placement of left-sided double-J ureteral stent. 2. Punctate (approximately 0.7 cm) stone overlies the expected location of left renal pelvis. Electronically Signed   By: Sandi Mariscal M.D.   On: 09/24/2018 08:32    Procedures Procedures (including critical care time)  Medications Ordered in ED Medications  sodium  chloride 0.9 % bolus 500 mL (0 mLs Intravenous Stopped 09/25/18 2157)    Followed by  0.9 %  sodium chloride infusion ( Intravenous Rate/Dose Change 09/25/18 2200)  ondansetron (ZOFRAN) injection 4 mg (4 mg Intravenous Given 09/25/18 2036)  HYDROmorphone (DILAUDID) injection 0.5 mg (0.5 mg Intravenous Given 09/25/18 2037)     Initial Impression / Assessment and Plan / ED Course  I have reviewed the triage vital signs and the nursing notes.  Pertinent labs & imaging results that were available during my care of the patient were reviewed by me and considered in my medical decision making (see chart for details).  Clinical Course as of Sep 24 2341  Tue Sep 25, 2018  2018 Bladder Scan showed less than 200 cc of urine   [JK]  2142 Creatinine is increased compared to previous   [JK]  2143 Hemoglobin decreased from previous   [JK]  2208 D/w Dr Louis Meckel.  Will do a CT renal scan.  Evaluate for possible perinephric hematoma considering her HGB decrease   [JK]  2339 Discussed findings with Dr. Louis Meckel.  Plan on admission to the hospital.  He requests medical consult for admission   [JK]  Clinical Course User Index [JK] Dorie Rank, MD     Patient presented to the ED for evaluation of vomiting and pain after her recent lithotripsy procedure.  Patient's CT scan was reviewed by Dr. Louis Meckel.  She appears to have a renal hematoma.  This is the likely cause of her drop in her hemoglobin.  Patient also has evidence of an AKI.  Patient has been hydrated and given medications for pain.  Plan admission to the hospital for further treatment.  Final Clinical Impressions(s) / ED Diagnoses   Final diagnoses:  Hematoma of left kidney, initial encounter  AKI (acute kidney injury) ([place])      Dorie Rank, MD 09/25/18 2352

## 2018-09-25 NOTE — Telephone Encounter (Signed)
   Reason for call:   I received a call from [name] at 5:25 PM indicating that she was having post procedural pain following her lithotripsy the prior day.   Pertinent Data:   The patient stated that she was generally improving since discharge late last month and had completed her antibiotic course. She denied fever, chills, myalgias, chest pain, abdominal pain but endorses moderate flank pain and nausea.  Early morning procedure, NPO prior, consumed roughly 30oz of fluid up to 8:00 PM due to nausea and did not urinate until that evening which concerned her. She is also concerned about the pain.   She endorsed 700cc of urinary output the past 24 hours and has strained to urinate on occasion. She denied knowing the quantity of fluids she has consumed today stating that it "likely wasn't much."   She has not attempted to call the urologist nor has she utilized her zofran for the nausea. She has used only 3 of the 5mg  oxycodone tablets since discharge including the one she was given in the hospital.    Assessment / Plan / Recommendations:   I explained to the patient the degree of uncertainty I have as to expected pain and symptoms she may experience post procedurally following a lithotripsy. As such, I advised her to call the urology center that performed the procedure to inquire. Additionally, given her lack of a fever, chills, myalgias, or other concerning symptoms she likely does not have an emergent process but should return a call or visit the ER were she to develop such symptoms.  She has agreed to contact [facility].  She will utilize the Zofran and Oxycodone as prescribed as well.   As always, pt is advised that if symptoms worsen or new symptoms arise, they should go to an urgent care facility or to to ER for further evaluation.   Kathi Ludwig, MD   09/25/2018, 5:37 PM

## 2018-09-26 ENCOUNTER — Inpatient Hospital Stay (HOSPITAL_COMMUNITY): Payer: Medicare Other

## 2018-09-26 DIAGNOSIS — Z8349 Family history of other endocrine, nutritional and metabolic diseases: Secondary | ICD-10-CM | POA: Diagnosis not present

## 2018-09-26 DIAGNOSIS — X58XXXA Exposure to other specified factors, initial encounter: Secondary | ICD-10-CM | POA: Diagnosis not present

## 2018-09-26 DIAGNOSIS — N99840 Postprocedural hematoma of a genitourinary system organ or structure following a genitourinary system procedure: Secondary | ICD-10-CM | POA: Diagnosis not present

## 2018-09-26 DIAGNOSIS — Z8673 Personal history of transient ischemic attack (TIA), and cerebral infarction without residual deficits: Secondary | ICD-10-CM

## 2018-09-26 DIAGNOSIS — F419 Anxiety disorder, unspecified: Secondary | ICD-10-CM | POA: Diagnosis not present

## 2018-09-26 DIAGNOSIS — Z888 Allergy status to other drugs, medicaments and biological substances status: Secondary | ICD-10-CM

## 2018-09-26 DIAGNOSIS — Z96652 Presence of left artificial knee joint: Secondary | ICD-10-CM | POA: Diagnosis present

## 2018-09-26 DIAGNOSIS — D62 Acute posthemorrhagic anemia: Secondary | ICD-10-CM | POA: Diagnosis present

## 2018-09-26 DIAGNOSIS — Z87442 Personal history of urinary calculi: Secondary | ICD-10-CM | POA: Diagnosis not present

## 2018-09-26 DIAGNOSIS — Z66 Do not resuscitate: Secondary | ICD-10-CM

## 2018-09-26 DIAGNOSIS — E118 Type 2 diabetes mellitus with unspecified complications: Secondary | ICD-10-CM | POA: Diagnosis present

## 2018-09-26 DIAGNOSIS — Z79899 Other long term (current) drug therapy: Secondary | ICD-10-CM | POA: Diagnosis not present

## 2018-09-26 DIAGNOSIS — K219 Gastro-esophageal reflux disease without esophagitis: Secondary | ICD-10-CM | POA: Diagnosis not present

## 2018-09-26 DIAGNOSIS — J449 Chronic obstructive pulmonary disease, unspecified: Secondary | ICD-10-CM

## 2018-09-26 DIAGNOSIS — Z79891 Long term (current) use of opiate analgesic: Secondary | ICD-10-CM

## 2018-09-26 DIAGNOSIS — Y733 Surgical instruments, materials and gastroenterology and urology devices (including sutures) associated with adverse incidents: Secondary | ICD-10-CM | POA: Diagnosis present

## 2018-09-26 DIAGNOSIS — Z89421 Acquired absence of other right toe(s): Secondary | ICD-10-CM | POA: Diagnosis not present

## 2018-09-26 DIAGNOSIS — N179 Acute kidney failure, unspecified: Secondary | ICD-10-CM | POA: Diagnosis not present

## 2018-09-26 DIAGNOSIS — Z87440 Personal history of urinary (tract) infections: Secondary | ICD-10-CM

## 2018-09-26 DIAGNOSIS — Z7984 Long term (current) use of oral hypoglycemic drugs: Secondary | ICD-10-CM | POA: Diagnosis not present

## 2018-09-26 DIAGNOSIS — S37019A Minor contusion of unspecified kidney, initial encounter: Secondary | ICD-10-CM | POA: Diagnosis present

## 2018-09-26 DIAGNOSIS — F329 Major depressive disorder, single episode, unspecified: Secondary | ICD-10-CM | POA: Diagnosis present

## 2018-09-26 DIAGNOSIS — Z884 Allergy status to anesthetic agent status: Secondary | ICD-10-CM

## 2018-09-26 DIAGNOSIS — R32 Unspecified urinary incontinence: Secondary | ICD-10-CM | POA: Diagnosis not present

## 2018-09-26 DIAGNOSIS — E119 Type 2 diabetes mellitus without complications: Secondary | ICD-10-CM

## 2018-09-26 DIAGNOSIS — F17210 Nicotine dependence, cigarettes, uncomplicated: Secondary | ICD-10-CM | POA: Diagnosis present

## 2018-09-26 DIAGNOSIS — S37012A Minor contusion of left kidney, initial encounter: Secondary | ICD-10-CM | POA: Diagnosis not present

## 2018-09-26 DIAGNOSIS — E785 Hyperlipidemia, unspecified: Secondary | ICD-10-CM | POA: Diagnosis not present

## 2018-09-26 DIAGNOSIS — Z720 Tobacco use: Secondary | ICD-10-CM | POA: Diagnosis not present

## 2018-09-26 DIAGNOSIS — G43909 Migraine, unspecified, not intractable, without status migrainosus: Secondary | ICD-10-CM | POA: Diagnosis not present

## 2018-09-26 DIAGNOSIS — E876 Hypokalemia: Secondary | ICD-10-CM | POA: Diagnosis not present

## 2018-09-26 DIAGNOSIS — I10 Essential (primary) hypertension: Secondary | ICD-10-CM | POA: Diagnosis not present

## 2018-09-26 DIAGNOSIS — F339 Major depressive disorder, recurrent, unspecified: Secondary | ICD-10-CM | POA: Diagnosis not present

## 2018-09-26 DIAGNOSIS — N390 Urinary tract infection, site not specified: Secondary | ICD-10-CM | POA: Diagnosis present

## 2018-09-26 LAB — BASIC METABOLIC PANEL
Anion gap: 12 (ref 5–15)
Anion gap: 5 (ref 5–15)
BUN: 32 mg/dL — ABNORMAL HIGH (ref 8–23)
BUN: 32 mg/dL — ABNORMAL HIGH (ref 8–23)
CO2: 17 mmol/L — ABNORMAL LOW (ref 22–32)
CO2: 19 mmol/L — ABNORMAL LOW (ref 22–32)
Calcium: 8.9 mg/dL (ref 8.9–10.3)
Calcium: 8.4 mg/dL — ABNORMAL LOW (ref 8.9–10.3)
Chloride: 109 mmol/L (ref 98–111)
Chloride: 111 mmol/L (ref 98–111)
Creatinine, Ser: 2.71 mg/dL — ABNORMAL HIGH (ref 0.44–1.00)
Creatinine, Ser: 2.73 mg/dL — ABNORMAL HIGH (ref 0.44–1.00)
GFR calc Af Amer: 21 mL/min — ABNORMAL LOW (ref >60)
GFR calc Af Amer: 20 mL/min — ABNORMAL LOW (ref >60)
GFR calc non Af Amer: 18 mL/min — ABNORMAL LOW (ref >60)
GFR calc non Af Amer: 18 mL/min — ABNORMAL LOW (ref >60)
Glucose, Bld: 132 mg/dL — ABNORMAL HIGH (ref 70–99)
Glucose, Bld: 236 mg/dL — ABNORMAL HIGH (ref 70–99)
Potassium: 4.1 mmol/L (ref 3.5–5.1)
Potassium: 4.2 mmol/L (ref 3.5–5.1)
Sodium: 138 mmol/L (ref 135–145)
Sodium: 135 mmol/L (ref 135–145)

## 2018-09-26 LAB — CBC
HCT: 32.1 % — ABNORMAL LOW (ref 36.0–46.0)
HCT: 31.5 % — ABNORMAL LOW (ref 36.0–46.0)
HCT: 28.2 % — ABNORMAL LOW (ref 36.0–46.0)
Hemoglobin: 9.7 g/dL — ABNORMAL LOW (ref 12.0–15.0)
Hemoglobin: 9.2 g/dL — ABNORMAL LOW (ref 12.0–15.0)
Hemoglobin: 8.7 g/dL — ABNORMAL LOW (ref 12.0–15.0)
MCH: 30.8 pg (ref 26.0–34.0)
MCH: 29.6 pg (ref 26.0–34.0)
MCH: 31.1 pg (ref 26.0–34.0)
MCHC: 30.2 g/dL (ref 30.0–36.0)
MCHC: 29.2 g/dL — ABNORMAL LOW (ref 30.0–36.0)
MCHC: 30.9 g/dL (ref 30.0–36.0)
MCV: 101.9 fL — ABNORMAL HIGH (ref 80.0–100.0)
MCV: 101.3 fL — ABNORMAL HIGH (ref 80.0–100.0)
MCV: 100.7 fL — ABNORMAL HIGH (ref 80.0–100.0)
Platelets: 82 10*3/uL — ABNORMAL LOW (ref 150–400)
Platelets: 117 10*3/uL — ABNORMAL LOW (ref 150–400)
Platelets: 117 10*3/uL — ABNORMAL LOW (ref 150–400)
RBC: 3.15 MIL/uL — ABNORMAL LOW (ref 3.87–5.11)
RBC: 3.11 MIL/uL — ABNORMAL LOW (ref 3.87–5.11)
RBC: 2.8 MIL/uL — ABNORMAL LOW (ref 3.87–5.11)
RDW: 15.3 % (ref 11.5–15.5)
RDW: 14.8 % (ref 11.5–15.5)
RDW: 14.9 % (ref 11.5–15.5)
WBC: 8.5 10*3/uL (ref 4.0–10.5)
WBC: 7.5 10*3/uL (ref 4.0–10.5)
WBC: 7.5 10*3/uL (ref 4.0–10.5)
nRBC: 0 % (ref 0.0–0.2)
nRBC: 0 % (ref 0.0–0.2)
nRBC: 0 % (ref 0.0–0.2)

## 2018-09-26 LAB — URINALYSIS, ROUTINE W REFLEX MICROSCOPIC
Bilirubin Urine: NEGATIVE
Glucose, UA: 50 mg/dL — AB
Ketones, ur: NEGATIVE mg/dL
Nitrite: NEGATIVE
Protein, ur: 100 mg/dL — AB
RBC / HPF: >50 RBC/hpf — ABNORMAL HIGH (ref 0–5)
Specific Gravity, Urine: 1.023 (ref 1.005–1.030)
WBC, UA: >50 WBC/hpf — ABNORMAL HIGH (ref 0–5)
pH: 5 (ref 5.0–8.0)

## 2018-09-26 LAB — GLUCOSE, CAPILLARY
Glucose-Capillary: 105 mg/dL — ABNORMAL HIGH (ref 70–99)
Glucose-Capillary: 104 mg/dL — ABNORMAL HIGH (ref 70–99)
Glucose-Capillary: 132 mg/dL — ABNORMAL HIGH (ref 70–99)
Glucose-Capillary: 222 mg/dL — ABNORMAL HIGH (ref 70–99)
Glucose-Capillary: 136 mg/dL — ABNORMAL HIGH (ref 70–99)

## 2018-09-26 MED ORDER — ACETAMINOPHEN 650 MG RE SUPP: 650.0000 mg | Freq: Four times a day (QID) | RECTAL | Status: DC | PRN

## 2018-09-26 MED ORDER — PANTOPRAZOLE SODIUM 40 MG PO TBEC
40.0000 mg | DELAYED_RELEASE_TABLET | Freq: Every day | ORAL | Status: DC
  Administered 2018-09-26 – 2018-10-02 (×7): 40 mg via ORAL
  Filled 2018-09-26 (×7): qty 1

## 2018-09-26 MED ORDER — ROSUVASTATIN CALCIUM 5 MG PO TABS
10.0000 mg | ORAL_TABLET | Freq: Every day | ORAL | Status: DC
  Administered 2018-09-26: 19:00:00 10 mg via ORAL
  Filled 2018-09-26 (×2): qty 2

## 2018-09-26 MED ORDER — HYDROMORPHONE HCL 1 MG/ML IJ SOLN
0.5000 mg | INTRAMUSCULAR | Status: DC | PRN
  Administered 2018-09-26 (×2): 0.5 mg via INTRAVENOUS
  Administered 2018-09-26 – 2018-09-27 (×3): 1 mg via INTRAVENOUS
  Filled 2018-09-26: qty 0.5
  Filled 2018-09-26 (×2): qty 1
  Filled 2018-09-26: qty 0.5
  Filled 2018-09-26: qty 1

## 2018-09-26 MED ORDER — ROSUVASTATIN CALCIUM 20 MG PO TABS: 40.0000 mg | ORAL_TABLET | Freq: Every day | ORAL | Status: DC

## 2018-09-26 MED ORDER — INSULIN GLARGINE 100 UNIT/ML ~~LOC~~ SOLN: 3.0000 [IU] | Freq: Every day | SUBCUTANEOUS | Status: DC

## 2018-09-26 MED ORDER — HYDROMORPHONE HCL 1 MG/ML IJ SOLN
0.5000 mg | Freq: Once | INTRAMUSCULAR | Status: AC
  Administered 2018-09-26: 0.5 mg via INTRAVENOUS
  Filled 2018-09-26: qty 1

## 2018-09-26 MED ORDER — SERTRALINE HCL 100 MG PO TABS
100.0000 mg | ORAL_TABLET | Freq: Every day | ORAL | Status: DC
  Administered 2018-09-26 – 2018-09-29 (×4): 100 mg via ORAL
  Filled 2018-09-26 (×5): qty 1

## 2018-09-26 MED ORDER — ACETAMINOPHEN 325 MG PO TABS: 650.0000 mg | ORAL_TABLET | Freq: Four times a day (QID) | ORAL | Status: DC | PRN

## 2018-09-26 NOTE — H&P (Signed)
Date: 09/26/2018               Patient Name:  [name] MRN: [id number]  DOB: Oct 09, 1950 Age / Sex: 68 y.o., female   PCP: Lars Mage, MD         Medical Service: Internal Medicine Teaching Service         Attending Physician: Dr. Rebeca Alert, Raynaldo Opitz, MD    First Contact: Dr. Laural Golden, Areeg Pager: [phone]  Second Contact: Dr. Lorella Nimrod Pager: [phone]       After Hours (After 5p/  First Contact Pager: [phone]  weekends / holidays): Second Contact Pager: [phone]   Chief Complaint: Abdominal pain  History of Present Illness:  Mrs.Cherian is a 68 yo F w/ PMH of COPD, T2DM, HTN, tobacco use, TIA and recent lithotripsy and stent placement for obstructive nephrolithiasis on L side (09/24/18) presenting with flank pain and nausea. She was in her usual state of health until after she was discharged following her procedure, she began to have 8/10 abdominal tenderness described as 'crampy pain' that radiated diffusely throughout her stomach. She describes it as constant, exacerbated by movement and no obvious relieving factors. She mentions she took her post-op Percocet with minimal relief. She noted that her urine output decreased significantly, citing that her last urine output was 24 hours ago and was noted to have low volume with red color. She states she also had concurrent nausea, NBNB vomiting and loss of appetite. She denies any fevers, chills, diarrhea, constipation. Denies any sick contact or travel. She states she called the [facility] outpatient clinic off-hour number and was advised to call her urologist and her urologist informed her to go to the ED.  She initially presented to [facility] ED and was found to have AKI and hgb of 9.5. CT renal was performed which showed left perinephric hematoma. Urologist was consulted and recommended admission to medicine for fluid resuscitation and hemoglobin monitoring. She received 500cc bolus and was started on 125cc/hr maintenance  fluid as well as given zofran for nausea and dilaudid for pain control.  Meds: Current Meds  Medication Sig  . albuterol (PROVENTIL HFA;VENTOLIN HFA) 108 (90 Base) MCG/ACT inhaler Inhale 1-2 puffs into the lungs every 6 (six) hours as needed for wheezing or shortness of breath.  . gabapentin (NEURONTIN) 400 MG capsule TAKE 1 CAPSULE BY MOUTH THREE TIMES DAILY (Patient taking differently: Take 400 mg by mouth 3 (three) times daily. )  . JANUVIA 50 MG tablet TAKE 1 TABLET(50 MG) BY MOUTH DAILY (Patient taking differently: Take 50 mg by mouth daily. )  . metFORMIN (GLUMETZA) 1000 MG (MOD) 24 hr tablet Take 1 tablet (1,000 mg total) by mouth 2 (two) times daily with a meal.  . oxybutynin (DITROPAN) 5 MG tablet Take 1 tablet (5 mg total) by mouth daily for 30 days.  Marland Kitchen oxyCODONE-acetaminophen (PERCOCET/ROXICET) 5-325 MG tablet Take 1 tablet by mouth every 6 (six) hours as needed for severe pain.  . pantoprazole (PROTONIX) 40 MG tablet Take 1 tablet (40 mg total) by mouth 2 (two) times daily. TAKE 1 TABLET(40 MG) BY MOUTH twice per day.  Before 1st and last meal of the day. (Patient taking differently: Take 40 mg by mouth 2 (two) times daily before a meal. )  . rosuvastatin (CRESTOR) 40 MG tablet TAKE 1 TABLET BY MOUTH EVERY DAY (Patient taking differently: Take 40 mg by mouth daily. )  . sertraline (ZOLOFT) 100 MG tablet TAKE 1 TABLET(100 MG)  BY MOUTH DAILY (Patient taking differently: Take 100 mg by mouth daily. )   Allergies: Allergies as of 09/25/2018 - Review Complete 09/25/2018  Allergen Reaction Noted  . Anesthesia s-i-60 Other (See Comments) 01/20/2017  . Flexeril [cyclobenzaprine] Other (See Comments) 08/12/2012  . Soma [carisoprodol] Itching and Rash 05/13/2012   Past Medical History:  Diagnosis Date  . Allergy   . Anemia   . Arthritis    "left knee" (09/08/2015)  . Asthma   . Blood transfusion without reported diagnosis 10/2017   anemic  . Brachial plexus disorders   . CHF  (congestive heart failure) ([place])   . Chronic pain    neck pain, headache, neuropathy  . COPD (chronic obstructive pulmonary disease) ([place])    SEES ONLY DR. PATEL -   . Depression    "when my husband passed in 2013"  . Facial trauma 05/2018  . Fall 04/19/2018  . Gastric ulcer   . GERD (gastroesophageal reflux disease)   . History of kidney stones   . Hypertension   . Hypertriglyceridemia   . Migraine    "monthly" (09/08/2015)  . Neuromuscular disorder (HCC)    neuropathy  . Puncture wound of foot, right 05/17/2012   Tetanus shot 3 yrs ago at [facility] in [place], per pt report   . Stroke ([place])    [facility]    IN [place]   4 YRS AGO   . Type II diabetes mellitus ([place]) 2010   diagnosed around 2010, only ever on metformin   Family History: Family History  Problem Relation Age of Onset  . Hyperlipidemia Mother   . Heart attack Father 66  . Hypertension Father   . Cancer Paternal Grandfather        Lung cancer  . Cancer Maternal Grandmother   . Heart attack Maternal Grandmother   . Colon cancer Neg Hx   . Colon polyps Neg Hx   . Esophageal cancer Neg Hx   . Rectal cancer Neg Hx   . Stomach cancer Neg Hx    Social History: She lives with her daughter, two grandsons, granddaughter and grand daughters fiance. Denies ETOH and illicit substance use. Smokes about 5 cigarettes per day. Husband passed away 6 years ago.  Review of Systems: A complete ROS was negative except as per HPI.  Physical Exam: Blood pressure 111/69, pulse 90, temperature 98.1 F (36.7 C), resp. rate 20, SpO2 96 %.  Gen: Well-developed, well nourished, tired-appearing HEENT: neg conjunctival pallor, dry mucous membranes CV: RRR, S1, S2 normal, No rubs, no murmurs, no gallops Pulm: CTAB, No rales, no wheezes, no dullness to percussion  Abd: Soft, BS+, Diffuse tenderness to palpation, equisite tenderness over LLQ. + L CVA tenderness Extm: ROM intact, Peripheral pulses intact, No peripheral edem Skin: Pale,  Dry, Warm, poor turgor  Psych: Normal mood and affect  EKG: N/A  CXR: N/A  Assessment & Plan by Problem: Active Problems:   AKI (acute kidney injury) ([place])  Mrs.Kreitzer is a 68 yo F w/ PMH of COPD, T2DM, HTN, tobacco use, TIA and recent lithotripsy and stent placement for obstructive nephrolithiasis on L side (09/24/18) presenting with flank pain and nausea due to perinephritic hematoma.   Abdominal pain and nausea 2/2 perinephritic hematoma Hemoglobin drop from 11.6 (09/06/18) down to 9.5. L Perinephritic hematoma on CT abd/pelvis with nephrolith fragments. Lithotripsy and stent placed 09/24/18. +RBCs on UA. Urology plan to see tomorrow am. - Appreciate urology recs - Trend CBC - Transfuse if less than  7 - Zofran 4mg  PRN for nausea, oxycodone-acetaminophen 5-325 mg PRN for pain  AKI 2/2 lithotripsy vs dehydration Baseline creatinine 1.1. Current creatinine 2.62. BUN/Cr ratio 12.6 Minimal urine output per patient - C/w 125cc/hr NS  - Strict I/Os - Trend BMP - Avoid nephrotoxic meds  HLD - c/w home med: rosuvastatin 40mg  daily  Anxiety - C/w home med: sertraline 100mg  daily  GERd - c/w home med: pantoprazole 40mg  dailly  DVT prophx: Holding in setting of possibly active hematoma Diet: Diabetic Bowel: Senokot Code: DNR  Dispo: Admit patient to Observation with expected length of stay less than 2 midnights.  Signed: Mosetta Anis, MD 09/26/2018, 12:03 AM  Pager: [phone]

## 2018-09-26 NOTE — TOC Initial Note (Signed)
Transition of Care [facility]) - Initial/Assessment Note    Patient Details  Name: [name] MRN: [id number] Date of Birth: June 30, 1950  Transition of Care [facility]) CM/SW Contact:    Pollie Friar, RN Phone Number: 09/26/2018, 10:54 AM  Clinical Narrative:                   Expected Discharge Plan: Home/Self Care Barriers to Discharge: Continued Medical Work up   Patient Goals and CMS Choice        Expected Discharge Plan and Services Expected Discharge Plan: Home/Self Care       Living arrangements for the past 2 months: Single Family Home(2 levels but she stays on the ground level)                          Prior Living Arrangements/Services Living arrangements for the past 2 months: Single Family Home(2 levels but she stays on the ground level) Lives with:: Adult Children, Other (Comment)(daughter/ grandchildren and their kids) Patient language and need for interpreter reviewed:: Yes(no needs) Do you feel safe going back to the place where you live?: Yes        Care giver support system in place?: Yes (comment)(pt states she has 24 hour supervision.) Current home services: DME(3 in 1, shower chair, wheelchair, walker) Criminal Activity/Legal Involvement Pertinent to Current Situation/Hospitalization: No - Comment as needed  Activities of Daily Living      Permission Sought/Granted   Permission granted to share information with : Yes, Verbal Permission Granted  Share Information with NAME: Tyne Banta     Permission granted to share info w Relationship: daughter  Permission granted to share info w Contact Information: [phone]  Emotional Assessment Appearance:: Appears older than stated age Attitude/Demeanor/Rapport: Other (comment)(lethargic) Affect (typically observed): Accepting, Quiet Orientation: : Oriented to Self, Oriented to Place, Oriented to  Time, Oriented to Situation Alcohol / Substance Use: Tobacco Use Psych Involvement: No  (comment)  Admission diagnosis:  AKI (acute kidney injury) ([place]) [N17.9] Hematoma of left kidney, initial encounter [S37.012A] Patient Active Problem List   Diagnosis Date Noted  . Nephrolithiasis 09/06/2018  . Vomiting 07/21/2018  . Post concussive syndrome 06/20/2018  . Diabetes mellitus treated with oral medication ([place])   . Head trauma 06/18/2018  . Intractable vomiting with nausea 06/18/2018  . Facial trauma, initial encounter 06/18/2018  . Pain in joint of left shoulder 05/15/2018  . Lip laceration   . Iron deficiency anemia due to chronic blood loss 01/05/2018  . Symptomatic anemia 11/07/2017  . Pressure injury of skin 11/07/2017  . Acute gastric ulcer with hemorrhage   . Pain in left knee 09/12/2017  . Healthcare maintenance 09/09/2017  . Allergic rhinitis 09/09/2017  . Major depressive disorder 07/21/2017  . Dysuria 06/16/2017  . Demand ischemia ([place]) 06/16/2017  . Petechiae 06/16/2017  . AKI (acute kidney injury) ([place])   . Right foot pain 04/22/2017  . Blister (nonthermal) of right ring finger, initial encounter 01/21/2017  . History of total knee replacement, 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  . Neck muscle strain 08/19/2016  . Onychomycosis of multiple toenails with type 2 diabetes mellitus ([place]) 11/04/2015  . Skin lesions 11/04/2015  . Syncope   . Overweight (BMI 25.0-29.9) 05/27/2015  . Leg ulcer, left ([place]) 03/05/2015  . Recurrent falls 03/05/2015  . Primary osteoarthritis of left knee 09/22/2014  . Esophageal reflux 07/18/2014  . Hyperlipidemia associated with type 2 diabetes mellitus ([place]) 02/26/2014  . Tobacco use  disorder 07/22/2013  . Headache 06/28/2013  . Diabetic peripheral neuropathy associated with type 2 diabetes mellitus ([place]) 06/14/2013  . Hx-TIA (transient ischemic attack) 03/10/2013  . Diabetes mellitus type 2, controlled, with complications ([place]) 74/73/4037  . Hypertension 05/13/2012   PCP:  Lars Mage, MD Pharmacy:   [facility] [place], [place] [address] Phone: [phone] Fax: [phone]     Social Determinants of Health (SDOH) Interventions  Pts family able to assist with transportation. Pt denies issue obtaining or taking meds at home.   Readmission Risk Interventions No flowsheet data found.

## 2018-09-26 NOTE — Progress Notes (Signed)
   Subjective: Patient reports left sided abdominal pain. She states she has only urinated 3x since her lithotripsy, once since she came to the hospital last night. She feels that she is holding onto urine. She is tolerating PO intake. She states dilaudid helped some with the pain. All questions and concerns addressed.   Objective:  Vital signs in last 24 hours: Vitals:   09/26/18 0115 09/26/18 0237 09/26/18 0335 09/26/18 0800  BP: 102/66 100/81 (!) 110/56 (!) 121/48  Pulse: 100 79  89  Resp:  18  18  Temp:  98.7 F (37.1 C) 98.4 F (36.9 C) 98.2 F (36.8 C)  TempSrc:  Oral Oral Oral  SpO2: 94% 94%  94%   Physical Exam Gen: seen comfortably laying in bed, no distress, pain with movement  CV: RRR, no murmurs Pulm: upper expiratory wheezing, normal effort Abdomen: left sided tenderness, bowel sounds present, soft, non-distended Ext: no edema, warm and well perfused   Assessment/Plan:  Active Problems:   AKI (acute kidney injury) ([place])  [name] is a 68 yo F w/ PMH of COPD, T2DM, HTN, tobacco use, TIA and recent lithotripsy and stent placement for obstructive nephrolithiasis on L side (09/24/18) presenting with flank pain and nausea due to perinephritic hematoma.   Perinephritic Hematoma: L Perinephritic hematoma on CT abd/pelvis with nephrolith fragments. Lithotripsy and stent placed 09/24/18. +RBCs on UA. Hemoglobin drop from 11.6 (09/06/18) down to 9.5 on admission. Stable this morning at 9.7. Urology recommended to monitor CBC and outpatient follow up.  - Appreciate urology recs - Trend CBC - Transfuse if less than 7 - Zofran 4mg  PRN for nausea, dilaudid 0.5-1mg  q2h PRN for pain  AKI: Baseline creatinine 1.1. Cr 2.71. BUN/Cr ratio 11.8. Minimal urine output per patient. Will check FENA and obtain a bladder scan. Unclear etiology, possibly due to obstruction vs dehydration.  - Bladder scan - Check FENA - C/w 125cc/hr NS   - Strict I/Os - Trend BMP - Avoid nephrotoxic  meds   Dispo: Anticipated discharge in approximately 1-2 day(s).   ,  N, DO 09/26/2018, 10:45 AM Pager: [phone]

## 2018-09-26 NOTE — ED Notes (Signed)
carelink called at this time.  

## 2018-09-27 ENCOUNTER — Other Ambulatory Visit: Payer: Self-pay

## 2018-09-27 ENCOUNTER — Encounter (HOSPITAL_COMMUNITY): Payer: Self-pay | Admitting: *Deleted

## 2018-09-27 ENCOUNTER — Telehealth: Payer: Self-pay | Admitting: Internal Medicine

## 2018-09-27 ENCOUNTER — Encounter: Payer: Self-pay | Admitting: Internal Medicine

## 2018-09-27 DIAGNOSIS — D62 Acute posthemorrhagic anemia: Secondary | ICD-10-CM

## 2018-09-27 HISTORY — DX: Acute posthemorrhagic anemia: D62

## 2018-09-27 LAB — CBC
HCT: 30 % — ABNORMAL LOW (ref 36.0–46.0)
Hemoglobin: 8.8 g/dL — ABNORMAL LOW (ref 12.0–15.0)
MCH: 29.5 pg (ref 26.0–34.0)
MCHC: 29.3 g/dL — ABNORMAL LOW (ref 30.0–36.0)
MCV: 100.7 fL — ABNORMAL HIGH (ref 80.0–100.0)
Platelets: 119 10*3/uL — ABNORMAL LOW (ref 150–400)
RBC: 2.98 MIL/uL — ABNORMAL LOW (ref 3.87–5.11)
RDW: 14.6 % (ref 11.5–15.5)
WBC: 8.7 10*3/uL (ref 4.0–10.5)
nRBC: 0 % (ref 0.0–0.2)

## 2018-09-27 LAB — BASIC METABOLIC PANEL
Anion gap: 11 (ref 5–15)
BUN: 30 mg/dL — ABNORMAL HIGH (ref 8–23)
CO2: 15 mmol/L — ABNORMAL LOW (ref 22–32)
Calcium: 8.7 mg/dL — ABNORMAL LOW (ref 8.9–10.3)
Chloride: 111 mmol/L (ref 98–111)
Creatinine, Ser: 2.31 mg/dL — ABNORMAL HIGH (ref 0.44–1.00)
GFR calc Af Amer: 25 mL/min — ABNORMAL LOW (ref >60)
GFR calc non Af Amer: 21 mL/min — ABNORMAL LOW (ref >60)
Glucose, Bld: 200 mg/dL — ABNORMAL HIGH (ref 70–99)
Potassium: 4.4 mmol/L (ref 3.5–5.1)
Sodium: 137 mmol/L (ref 135–145)

## 2018-09-27 LAB — GLUCOSE, CAPILLARY
Glucose-Capillary: 178 mg/dL — ABNORMAL HIGH (ref 70–99)
Glucose-Capillary: 145 mg/dL — ABNORMAL HIGH (ref 70–99)

## 2018-09-27 MED ORDER — OXYCODONE-ACETAMINOPHEN 5-325 MG PO TABS
1.0000 | ORAL_TABLET | ORAL | Status: DC | PRN
  Administered 2018-09-27: 14:00:00 1 via ORAL
  Filled 2018-09-27: qty 1

## 2018-09-27 MED ORDER — OXYCODONE-ACETAMINOPHEN 5-325 MG PO TABS: 1.0000 | ORAL_TABLET | ORAL | 0 refills | Status: DC | PRN

## 2018-09-27 MED ORDER — OXYCODONE-ACETAMINOPHEN 5-325 MG PO TABS
1.0000 | ORAL_TABLET | Freq: Once | ORAL | Status: AC
  Administered 2018-09-27: 1 via ORAL
  Filled 2018-09-27: qty 1

## 2018-09-27 MED ORDER — OXYCODONE-ACETAMINOPHEN 5-325 MG PO TABS
1.0000 | ORAL_TABLET | ORAL | Status: DC | PRN
  Filled 2018-09-27: qty 2

## 2018-09-27 NOTE — Progress Notes (Signed)
   Subjective: Patient reports feeling about the same this morning. Pain medications are helping. She states she had two episodes where she was unable to make it to the bathroom in time due to pain and soiled herself. She denies any blood in her urine. She is amenable to going home today with follow up in the clinic on Monday.  Objective:  Vital signs in last 24 hours: Vitals:   09/26/18 1920 09/26/18 2308 09/27/18 0331 09/27/18 0415  BP: 129/66 122/66 129/73 129/73  Pulse: 79 73 71 71  Resp: 18 18 18 18   Temp: 98 F (36.7 C) 98.3 F (36.8 C) 98 F (36.7 C) 98 F (36.7 C)  TempSrc: Oral Oral Oral Oral  SpO2: 100% 100% 100%   Weight:    78.2 kg  Height:    5\' 6"  (1.676 m)   Physical Exam Gen: seen comfortably lying in bed, no distress CV: RRR, no murmurs Abdomen: bowel sounds present, soft, left sided ttp Ext: no edema, warm and well perfused   Assessment/Plan:  Principal Problem:   AKI (acute kidney injury) ([place]) Active Problems:   Diabetes mellitus type 2, controlled, with complications (HCC)   Hypertension   Perinephric hematoma  [name] is a 68 yo F w/ PMH of COPD, T2DM, HTN, tobacco use, TIA and recent lithotripsy and stent placement for obstructive nephrolithiasis on L side (09/24/18) presenting with flank pain and nausea due to perinephritic hematoma.  Perinephritic Hematoma: L Perinephritic hematoma on CT abd/pelviswith nephrolith fragments.Lithotripsy and stent placed 09/24/18. Hgb 8.8, stable. Patient is stable for discharge home today with close outpatient follow up to recheck CBC and BMP. - Pain control with percocet q4h   AKI: Cr improved 2.31, Baseline creatinine 1.1. Urine output ~1.1L in the last 24 hours. Stable for discharge today with follow up to recheck BMP. - Avoid nephrotoxic meds   Dispo: Patient is medically stable for discharge today.   Mike Craze, DO 09/27/2018, 7:19 AM Pager: [phone]

## 2018-09-27 NOTE — Telephone Encounter (Signed)
Hosp f/u plus labs per Dr Laural Golden; 10/01/18 1045am/NW

## 2018-09-27 NOTE — Discharge Summary (Deleted)
Name: [name] MRN: [id number] DOB: 1951-01-05 68 y.o. PCP: Lars Mage, MD  Date of Admission: 09/25/2018  8:06 PM Date of Discharge: 09/27/2018 Attending Physician: Oda Kilts, MD  Discharge Diagnosis: 1. Perinephric Hematoma 2. AKI 3. Type 2 DM  Discharge Medications: Allergies as of 09/27/2018      Reactions   Anesthesia S-i-60 Other (See Comments)   Hallucinations.   Flexeril [cyclobenzaprine] Other (See Comments)   Prolonged QTc to 571, tachycardia   Soma [carisoprodol] Itching, Rash      Medication List    STOP taking these medications   metFORMIN 1000 MG (MOD) 24 hr tablet Commonly known as:  GLUMETZA     TAKE these medications   albuterol 108 (90 Base) MCG/ACT inhaler Commonly known as:  PROVENTIL HFA;VENTOLIN HFA Inhale 1-2 puffs into the lungs every 6 (six) hours as needed for wheezing or shortness of breath.   gabapentin 400 MG capsule Commonly known as:  NEURONTIN TAKE 1 CAPSULE BY MOUTH THREE TIMES DAILY   Januvia 50 MG tablet Generic drug:  sitaGLIPtin TAKE 1 TABLET(50 MG) BY MOUTH DAILY What changed:  See the new instructions.   ondansetron 4 MG tablet Commonly known as:  ZOFRAN Take 1 tablet (4 mg total) by mouth every 6 (six) hours as needed for nausea or vomiting.   oxybutynin 5 MG tablet Commonly known as:  DITROPAN Take 1 tablet (5 mg total) by mouth daily for 30 days.   oxyCODONE-acetaminophen 5-325 MG tablet Commonly known as:  PERCOCET/ROXICET Take 1 tablet by mouth every 4 (four) hours as needed for severe pain. What changed:  when to take this   pantoprazole 40 MG tablet Commonly known as:  PROTONIX Take 1 tablet (40 mg total) by mouth 2 (two) times daily. TAKE 1 TABLET(40 MG) BY MOUTH twice per day.  Before 1st and last meal of the day. What changed:    when to take this  additional instructions   rosuvastatin 40 MG tablet Commonly known as:  CRESTOR TAKE 1 TABLET BY MOUTH EVERY DAY   sertraline  100 MG tablet Commonly known as:  ZOLOFT TAKE 1 TABLET(100 MG) BY MOUTH DAILY What changed:  See the new instructions.       Disposition and follow-up:   [name] was discharged from [facility] in Stable condition.  At the hospital follow up visit please address:  1.  Perinephric hematoma- urology recommended serial cbc's. Stable hemoglobin at discharge, 8.8. Please recheck cbc  AKI- Cr 2.3 day of discharge, improved from 2.7 on admission. Likely in the setting of decrease oral intake and dehydration. Please recheck bmp  Type 2 DM- held metformin at discharge due to AKI, please recheck bmp and restart metformin if kidney function has improved  2.  Labs / imaging needed at time of follow-up: cbc, bmp  3.  Pending labs/ test needing follow-up: none  Follow-up Appointments: Follow-up Information    Jeanann Lewandowsky, [place] On 10/08/2018.   Specialty:  Urology Why:  1pm Contact information: [address] [place] [address] [phone]        [place]. Go on 10/01/2018.   Why:  @1045  am Contact information: [address] [place] Hospital Course by problem list: 1. Perinephric Hematoma- Patient had a recent lithotripsy and stent placement for obstructive nephrolithiasis on left side. She presented with worsening  left sided flank pain and nausea. CT abdomen pelvis showed left perinephritic hematoma with nephrolith fragments. UA showed +RBC's. Hemoglobin 9.5 on admission, down from 11.6 from 09/06/2018. Urology recommended to monitor CBC and outpatient follow up. Stable hgb 8.8 on discharge. Discharged to continue previously prescribed percocet with increased frequency, q4h.   2. AKI- Cr on admission 2.7, baseline 1.1. Most likely in the setting of decreased fluid intake and dehydration. Improved with IV hydration. Cr at discharge 2.3. Will need to recheck BMP at follow  up.   3. Type 2 DM- held metformin at discharge due to AKI. Please recheck BMP and if kidney function has improved restart metformin.   Discharge Vitals:   BP 123/65 (BP Location: Left Arm)   Pulse 80   Temp 98.2 F (36.8 C) (Oral)   Resp 18   Ht 5\' 6"  (1.676 m)   Wt 78.2 kg   SpO2 96%   BMI 27.83 kg/m   Pertinent Labs, Studies, and Procedures:   CBC Latest Ref Rng & Units 09/27/2018 09/26/2018 09/26/2018  WBC 4.0 - 10.5 K/uL 8.7 7.5 7.5  Hemoglobin 12.0 - 15.0 g/dL 8.8(L) 8.7(L) 9.2(L)  Hematocrit 36.0 - 46.0 % 30.0(L) 28.2(L) 31.5(L)  Platelets 150 - 400 K/uL 119(L) 117(L) 117(L)   BMP Latest Ref Rng & Units 09/27/2018 09/26/2018 09/26/2018  Glucose 70 - 99 mg/dL 200(H) 236(H) 132(H)  BUN 8 - 23 mg/dL 30(H) 32(H) 32(H)  Creatinine 0.44 - 1.00 mg/dL 2.31(H) 2.73(H) 2.71(H)  BUN/Creat Ratio 12 - 28 - - -  Sodium 135 - 145 mmol/L 137 135 138  Potassium 3.5 - 5.1 mmol/L 4.4 4.2 4.1  Chloride 98 - 111 mmol/L 111 111 109  CO2 22 - 32 mmol/L 15(L) 19(L) 17(L)  Calcium 8.9 - 10.3 mg/dL 8.7(L) 8.4(L) 8.9   Dg Abd 1 View  Result Date: 09/24/2018 CLINICAL DATA:  Lithotripsy for left renal stone. EXAM: ABDOMEN - 1 VIEW COMPARISON:  CT abdomen and pelvis - 09/05/2018 FINDINGS: Interval placement of left-sided double-J ureteral stent with superior coil overlying expected location of left renal pelvis and inferior coil overlying expected location of the urinary bladder. There is a punctate (approximately 0.7 cm) apparent stone overlying expected location of the right renal pelvis. Punctate phleboliths overlies the right hemipelvis, unchanged. Vascular calcifications overlie the right hemipelvis. No definitive abnormal opacities overlies expected location of the right renal fossa, ureter or the urinary bladder. Nonobstructive bowel gas pattern. Post cholecystectomy. Moderate scoliotic curvature of the thoracolumbar spine with associated mild to moderate multilevel lumbar spine DDD. IMPRESSION: 1.  Interval placement of left-sided double-J ureteral stent. 2. Punctate (approximately 0.7 cm) stone overlies the expected location of left renal pelvis. Electronically Signed   By: Sandi Mariscal M.D.   On: 09/24/2018 08:32   US Renal  Result Date: 09/26/2018 CLINICAL DATA:  Acute kidney injury EXAM: RENAL / URINARY TRACT ULTRASOUND COMPLETE COMPARISON:  None. FINDINGS: Right Kidney: Renal measurements: 11.1 x 5.2 x 7 cm = volume: 210 mL . Echogenicity within normal limits. No solid mass or hydronephrosis visualized. 4.5 x 2.6 x 2.9 cm anechoic a vascular right renal mass most consistent with a cyst. Left Kidney: Renal measurements: 13 x 6.9 x 7.6 cm = volume: 353 mL. Echogenicity within normal limits. No mass or hydronephrosis visualized. Bladder: Appears normal for degree of bladder distention. IMPRESSION: No acute renal abnormality. Electronically Signed   By: Kathreen Devoid   On: 09/26/2018 18:45   Ct Renal Stone Study  Result Date:  09/25/2018 CLINICAL DATA:  Left lower quadrant pain, history of recent lithotripsy EXAM: CT ABDOMEN AND PELVIS WITHOUT CONTRAST TECHNIQUE: Multidetector CT imaging of the abdomen and pelvis was performed following the standard protocol without IV contrast. COMPARISON:  09/05/2018, 09/24/2018 FINDINGS: Lower chest: Mild bibasilar atelectasis is noted right greater than left. Hepatobiliary: No focal liver abnormality is seen. Status post cholecystectomy. No biliary dilatation. Pancreas: Unremarkable. No pancreatic ductal dilatation or surrounding inflammatory changes. Spleen: Normal in size without focal abnormality. Adrenals/Urinary Tract: Adrenal glands are within normal limits bilaterally. The right kidney again demonstrates a parapelvic cyst. No obstructive changes are seen. No calculi are noted. The bladder is within normal limits. The left kidney demonstrates evidence of a significant perinephric hematoma which measures approximately 2.1 cm in thickness and extends the entire  length of the left kidney. Left ureteral stent is noted in place. No definitive ureteral calculi are seen. Some minimal density in the lower pole of the left kidney is noted consistent with some retained stone fragments. This appears to represent the equivalent of Stein[address] within the lower pole collecting system. Perinephric stranding is noted surrounding the left kidney and extending inferiorly along the course of the ureter into the pelvis related to the hemorrhage. Stomach/Bowel: Colon is within normal limits. The appendix is unremarkable. No small bowel or gastric abnormality is seen. Vascular/Lymphatic: Aortic atherosclerosis. No enlarged abdominal or pelvic lymph nodes. Reproductive: Status post hysterectomy. No adnexal masses. Other: No abdominal wall hernia or abnormality. No abdominopelvic ascites. Musculoskeletal: Degenerative changes of lumbar spine are noted. IMPRESSION: Changes consistent with left perinephric hematoma related to the recent lithotripsy. Multiple stone fragments are identified in the lower pole renal collecting system on the left. No ureteral stones are seen. Left ureteral stent in satisfactory position. Chronic changes similar to that seen on the prior CT examination. Critical Value/emergent results were called by telephone at the time of interpretation on 09/25/2018 at 11:51 pm to Dr. Dorie Rank , who verbally acknowledged these results. Electronically Signed   By: Inez Catalina M.D.   On: 09/25/2018 23:52    Discharge Instructions: Discharge Instructions    Diet - low sodium heart healthy   Complete by:  As directed    Increase activity slowly   Complete by:  As directed     Ms. Spadafore,  You were hospitalized due to a perinephric hematoma, which is bleeding around your kidney. You were treated with pain control and we monitored your blood counts which are stable. I want you to follow up with the [facility] clinic on Monday so we can recheck your blood counts and kidney  function.  For your pain continue to take Percocet 1 tablet every 4 hours as needed.   I want you to hold your metformin until your hospital follow up appointment. Continue taking the rest of your medications as prescribed.   Thank you for allowing [place] to be a part of your care!  Signed: Mike Craze, DO 09/27/2018, 10:58 AM

## 2018-09-27 NOTE — Consult Note (Signed)
   [facility] CM Inpatient Consult   09/27/2018  Daveah Varone February 12, 1951 [id number]    Patient was reviewed for readmission within 30 days and 3 hospitalizations in the last 6 months with her Medicare/ Next Gen plan. Unplanned readmission risk score of 20% (medium). Per chart review and history & physical on 09/27/18 reveal as follows: Patient is a 68 year old woman with history of COPD, T2DM, HTN, TIA and smoking. She was recently admitted on 09/05/2018 for UTI and ureterolithiasis, underwent cystoscopy and left ureteral stenting at that time.  She followed up with urology and had shockwave lithotripsy.  Patient was recommended at her PCP's office to contact urology, and was then referred to the ER for further evaluation of flank pain and nausea due to perinephritic hematoma.   Transition of care CM note reviewed for disposition. Called and spoke with patient over the phone and reports not able to go home yet due to headache and nausea causing some blood pressure elevation. Had also mentioned about "history of falls and sleeping on recliner due to neck Injury".  According to patient. she is living with her daughter [name]) and granddaughter. Denies having any concerns or barriers with medications/ pharmacy ([facility]), transportation or disease management needs. She verbalized having family members' support with needs at home. Patient mentioned having wrist BP device and trying best to follow diet restrictions. Patient states "I don't want people coming to the house" but phone follow-up is okay". She confirmed her phone contact number as listed in [place]. Patient could benefit from [facility] CM follow-up calls for disease management (HTN) post discharge.  Primary care provider is Dr. Lars Mage with [facility], listed as providing transition of care.  Will follow disposition. If patient'spost hospital needs change, please place a [place] Management consult for  community follow-up as appropriate.     Addendum:  Care coordination call made with Transition of care CM and shared health issues and concerns regarding patient and states will possibly arrange for home health (RN, PT) to follow-up with patient. Referral made to [place] for complex disease management on issues maintaining blood pressure.  Of note, [facility] Care Management services does not replace or interfere with any services that are arranged by transition of care case management or social work.    For questions and referral, please contact:  Lyzette Reinhardt A. Lavoris Sparling, BSN, RN-BC [facility] Liaison Cell: [phone]

## 2018-09-27 NOTE — TOC Transition Note (Signed)
Transition of Care [facility]) - CM/SW Discharge Note   Patient Details  Name: [name] MRN: [id number] Date of Birth: 18-Jun-1950  Transition of Care [facility]) CM/SW Contact:  Pollie Friar, RN Phone Number: 09/27/2018, 11:57 AM   Clinical Narrative:    Pt has transportation home. No new meds. Pt has hospital f/u.   Final next level of care: Home/Self Care Barriers to Discharge: No Barriers Identified   Patient Goals and CMS Choice        Discharge Placement                       Discharge Plan and Services                          Social Determinants of Health (SDOH) Interventions     Readmission Risk Interventions Readmission Risk Prevention Plan 09/26/2018  Transportation Screening Complete  Home Care Screening Complete  Medication Review (RN CM) Referral to Pharmacy  Some recent data might be hidden

## 2018-09-28 LAB — BASIC METABOLIC PANEL
Anion gap: 14 (ref 5–15)
BUN: 22 mg/dL (ref 8–23)
CO2: 16 mmol/L — ABNORMAL LOW (ref 22–32)
Calcium: 9.5 mg/dL (ref 8.9–10.3)
Chloride: 108 mmol/L (ref 98–111)
Creatinine, Ser: 1.64 mg/dL — ABNORMAL HIGH (ref 0.44–1.00)
GFR calc Af Amer: 38 mL/min — ABNORMAL LOW (ref >60)
GFR calc non Af Amer: 32 mL/min — ABNORMAL LOW (ref >60)
Glucose, Bld: 147 mg/dL — ABNORMAL HIGH (ref 70–99)
Potassium: 3.6 mmol/L (ref 3.5–5.1)
Sodium: 138 mmol/L (ref 135–145)

## 2018-09-28 LAB — GLUCOSE, CAPILLARY
Glucose-Capillary: 139 mg/dL — ABNORMAL HIGH (ref 70–99)
Glucose-Capillary: 144 mg/dL — ABNORMAL HIGH (ref 70–99)
Glucose-Capillary: 150 mg/dL — ABNORMAL HIGH (ref 70–99)
Glucose-Capillary: 155 mg/dL — ABNORMAL HIGH (ref 70–99)

## 2018-09-28 LAB — CBC
HCT: 30.3 % — ABNORMAL LOW (ref 36.0–46.0)
Hemoglobin: 9.7 g/dL — ABNORMAL LOW (ref 12.0–15.0)
MCH: 30.7 pg (ref 26.0–34.0)
MCHC: 32 g/dL (ref 30.0–36.0)
MCV: 95.9 fL (ref 80.0–100.0)
Platelets: 159 10*3/uL (ref 150–400)
RBC: 3.16 MIL/uL — ABNORMAL LOW (ref 3.87–5.11)
RDW: 14.1 % (ref 11.5–15.5)
WBC: 9.8 10*3/uL (ref 4.0–10.5)
nRBC: 0 % (ref 0.0–0.2)

## 2018-09-28 MED ORDER — ROSUVASTATIN CALCIUM 20 MG PO TABS
40.0000 mg | ORAL_TABLET | Freq: Every day | ORAL | Status: DC
  Administered 2018-09-28 – 2018-10-01 (×4): 40 mg via ORAL
  Filled 2018-09-28 (×6): qty 2

## 2018-09-28 MED ORDER — LISINOPRIL 10 MG PO TABS
10.0000 mg | ORAL_TABLET | Freq: Every day | ORAL | Status: DC
  Administered 2018-09-28: 12:00:00 10 mg via ORAL
  Filled 2018-09-28: qty 1

## 2018-09-28 MED ORDER — HYDRALAZINE HCL 20 MG/ML IJ SOLN
10.0000 mg | Freq: Once | INTRAMUSCULAR | Status: AC
  Administered 2018-09-28: 10 mg via INTRAVENOUS
  Filled 2018-09-28: qty 1

## 2018-09-28 MED ORDER — METOCLOPRAMIDE HCL 5 MG/ML IJ SOLN
5.0000 mg | Freq: Once | INTRAMUSCULAR | Status: AC
  Administered 2018-09-28: 5 mg via INTRAVENOUS
  Filled 2018-09-28: qty 2

## 2018-09-28 MED ORDER — ONDANSETRON HCL 4 MG PO TABS
4.0000 mg | ORAL_TABLET | Freq: Four times a day (QID) | ORAL | Status: DC
  Filled 2018-09-28: qty 1

## 2018-09-28 MED ORDER — SODIUM CHLORIDE 0.9 % IV SOLN
INTRAVENOUS | Status: AC
  Administered 2018-09-28: 10:00:00 via INTRAVENOUS

## 2018-09-28 MED ORDER — HYDROMORPHONE HCL 1 MG/ML IJ SOLN
0.5000 mg | INTRAMUSCULAR | Status: DC | PRN
  Administered 2018-09-28: 0.5 mg via INTRAVENOUS
  Filled 2018-09-28 (×3): qty 0.5

## 2018-09-28 MED ORDER — DIPHENHYDRAMINE HCL 50 MG/ML IJ SOLN
25.0000 mg | Freq: Once | INTRAMUSCULAR | Status: AC
  Administered 2018-09-28: 08:00:00 25 mg via INTRAVENOUS
  Filled 2018-09-28: qty 1

## 2018-09-28 MED ORDER — AMLODIPINE BESYLATE 5 MG PO TABS
5.0000 mg | ORAL_TABLET | Freq: Every day | ORAL | Status: DC
  Administered 2018-09-28: 14:00:00 5 mg via ORAL
  Filled 2018-09-28 (×2): qty 1

## 2018-09-28 MED ORDER — HYDROMORPHONE HCL 1 MG/ML IJ SOLN
1.0000 mg | Freq: Once | INTRAMUSCULAR | Status: DC
  Administered 2018-09-28: 1 mg via INTRAVENOUS

## 2018-09-28 MED ORDER — ONDANSETRON HCL 4 MG/2ML IJ SOLN: 4.0000 mg | Freq: Four times a day (QID) | INTRAMUSCULAR | Status: DC

## 2018-09-28 NOTE — Progress Notes (Signed)
Paged Dr. Laural Golden in reference to patients blood pressures. Doctor stated to try and get the patient to take the pain meds, she feels the bp is up due to the pain and will add prn bp meds.

## 2018-09-28 NOTE — Telephone Encounter (Signed)
No attempt made for transition of care call-pt currently hospitalized.Regenia Skeeter, Maryam Feely Cassady4/17/20201:01 PM

## 2018-09-28 NOTE — Progress Notes (Signed)
Patient is reporting pain 7 out of 10 in her head. RN provided pain medication and the patient then refused to take it. Patient felt nauseous and stated she would rather hurt.

## 2018-09-28 NOTE — Progress Notes (Signed)
  Date: 09/28/2018  Patient name: [name]  Medical record number: [id number]  Date of birth: Feb 15, 1951   I have seen and evaluated this patient and I have discussed the plan of care with the house staff. Please see their note for complete details. I concur with their findings with the following additions/corrections:   Unfortunately, had severe headache overnight and continues this morning.  She says it feels like her migraines.  She declined medications overnight.  We will treat with IV metoclopramide and diphenhydramine as well as IV Dilaudid for her abdominal pain.  She also has significant hypertension, likely related to her pain and migraine.  No acute treatment indicated at this time, will reassess blood pressure after getting her pain under better control.  Lenice Pressman, M.D., Ph.D. 09/28/2018, 12:05 PM

## 2018-09-28 NOTE — Progress Notes (Signed)
   Subjective: Patient continues to have a migraine this morning. She kept her eyes shut during the exam. States she is still having abdominal pain and a headache but did not verbally communicate beyond that.   Objective:  Vital signs in last 24 hours: Vitals:   09/28/18 0000 09/28/18 0500 09/28/18 0728 09/28/18 0757  BP: (!) 195/70 (!) 197/88 (!) 193/80 (!) 184/90  Pulse: 77 77 65 74  Resp: 18 20 16 18   Temp: 98.6 F (37 C) 98.6 F (37 C) 98.2 F (36.8 C)   TempSrc: Oral Oral Oral   SpO2: 100% 98% 98%   Weight:      Height:   5\' 6"  (1.676 m)    Physical Exam Gen: seen uncomfortably laying in bed, eyes shut  CV: RRR, no murmurs Abdomen: soft, non distend, bowel sounds present, left sided ttp Ext: no edema, warm and well perfused   Assessment/Plan:  Principal Problem:   AKI (acute kidney injury) (HCC) Active Problems:   Diabetes mellitus type 2, controlled, with complications (HCC)   Hypertension   Perinephric hematoma   Acute blood loss anemia  Mrs.Seckel is a 68 yo F w/ PMH of COPD, T2DM, HTN, tobacco use, TIA and recent lithotripsy and stent placement for obstructive nephrolithiasis on L side (09/24/18) presenting with flank pain and nausea due to perinephritic hematoma.  Perinephritic Hematoma:L Perinephritic hematoma on CT abd/pelviswith nephrolith fragments.Lithotripsy and stent placed 09/24/18. Hgb 9.7, stable.  - Pain control with dilaudid 0.5 mg q4h  MHD:[id number]. Cr improved 1.64, Baseline creatinine 1.1.  - Avoid nephrotoxic meds  Migraine: Patient was to be discharged yesterday however started having a migraine and elevated BP. She has a history of migraines. Will treat with benadryl and reglan. Will also continue pain medications. - Reglan and benadryl   Hypertension: Patient has been hypertensive since yesterday afternoon, systolic in the 921-194R. She has a history of hypertension which has required numerous medications. At one time she was on 3  antihypertensives but complained of dizziness and syncopal episodes so she was tapered down. She self-discontinued all of her anti-hypertensives due to a syncopal episode in August 2019. She was normotensive at clinic follow ups and thought to be due to weight loss.  - IV hydralazine 10 mg once - Restart low dose lisinopril with close follow up   Dispo: Anticipated discharge in approximately 0-1 day(s).   Avram Danielson N, DO 09/28/2018, 11:35 AM Pager: [phone]

## 2018-09-28 NOTE — Care Management Important Message (Signed)
Important Message  Patient Details  Name: [name] MRN: [id number] Date of Birth: 1950/09/28   Medicare Important Message Given:  Yes    Orbie Pyo 09/28/2018, 3:08 PM

## 2018-09-28 NOTE — Progress Notes (Signed)
PT Cancellation Note  Patient Details Name: [name] MRN: [id number] DOB: 03-25-1951   Cancelled Treatment:    Reason Eval/Treat Not Completed: Pain limiting ability to participate Pt with migraine and requesting to hold on PT until tomorrow. Will follow up as schedule allows.   Leighton Ruff, PT, DPT  Acute Rehabilitation Services  Pager: [phone] Office: [phone]    Rudean Hitt 09/28/2018, 4:13 PM

## 2018-09-28 NOTE — Progress Notes (Signed)
Spoke with Dr. Laural Golden again, she is not going to give PRN bp meds. She states the bp is due to the pain of a headache, the pt does not have a history of hypertension. She will treat with pain meds and watch the bp.

## 2018-09-28 NOTE — Discharge Summary (Signed)
Name: [name] MRN: [id number] DOB: 1950-07-26 68 y.o. PCP: Lars Mage, MD  Date of Admission: 09/25/2018  8:06 PM Date of Discharge: 10/02/2018  4:52 PM Attending Physician: Joni Reining C  Discharge Diagnosis: 1. Perinephric Hematoma 2. AKI 3. Type 2 DM 4. Hypertension 5. Migraine 6. Depression 7. Hypokalemia  Discharge Medications: Allergies as of 10/02/2018      Reactions   Anesthesia S-i-60 Other (See Comments)   Hallucinations.   Flexeril [cyclobenzaprine] Other (See Comments)   Prolonged QTc to 571, tachycardia   Soma [carisoprodol] Itching, Rash      Medication List    STOP taking these medications   metFORMIN 1000 MG (MOD) 24 hr tablet Commonly known as:  GLUMETZA     TAKE these medications   albuterol 108 (90 Base) MCG/ACT inhaler Commonly known as:  VENTOLIN HFA Inhale 1-2 puffs into the lungs every 6 (six) hours as needed for wheezing or shortness of breath.   amLODipine 10 MG tablet Commonly known as:  NORVASC Take 1 tablet (10 mg total) by mouth daily for 30 days.   gabapentin 400 MG capsule Commonly known as:  NEURONTIN TAKE 1 CAPSULE BY MOUTH THREE TIMES DAILY   Januvia 50 MG tablet Generic drug:  sitaGLIPtin TAKE 1 TABLET(50 MG) BY MOUTH DAILY What changed:  See the new instructions.   ondansetron 4 MG tablet Commonly known as:  ZOFRAN Take 1 tablet (4 mg total) by mouth every 6 (six) hours as needed for nausea or vomiting.   oxybutynin 5 MG tablet Commonly known as:  DITROPAN Take 1 tablet (5 mg total) by mouth daily for 30 days.   oxyCODONE-acetaminophen 5-325 MG tablet Commonly known as:  PERCOCET/ROXICET Take 1 tablet by mouth every 4 (four) hours as needed for severe pain. What changed:  when to take this   pantoprazole 40 MG tablet Commonly known as:  PROTONIX Take 1 tablet (40 mg total) by mouth 2 (two) times daily. TAKE 1 TABLET(40 MG) BY MOUTH twice per day.  Before 1st and last meal of the day. What  changed:    when to take this  additional instructions   rosuvastatin 40 MG tablet Commonly known as:  CRESTOR TAKE 1 TABLET BY MOUTH EVERY DAY   sertraline 100 MG tablet Commonly known as:  ZOLOFT TAKE 1 TABLET(100 MG) BY MOUTH DAILY What changed:  See the new instructions.       Disposition and follow-up:   Ms.Artisha Gredmarie Delange was discharged from [facility] in Stable condition.  At the hospital follow up visit please address:  1.  Perinephric hematoma- urology recommended serial cbc's. Stable hemoglobin at discharge, 8.8. Please recheck cbc  AKI- Cr 2.3 day of discharge, improved from 2.7 on admission. Likely in the setting of decrease oral intake and dehydration. Please recheck bmp  Type 2 DM- held metformin at discharge due to AKI, please recheck bmp and restart metformin if kidney function has improved  2.  Labs / imaging needed at time of follow-up: cbc, bmp  3.  Pending labs/ test needing follow-up: none  Follow-up Appointments: Follow-up Information    Jeanann Lewandowsky, [place] On 10/08/2018.   Specialty:  Urology Why:  Zoila Shutter information: [address] [place] [address] [phone]        [facility] Follow up on 10/09/2018.   Why:  9:15am Contact information: [address] [place] [phone]  Hospital Course by problem list: 1. Perinephric Hematoma- Patient had a recent lithotripsy and stent placement for obstructive nephrolithiasis on left side. She presented with worsening left sided flank pain and nausea. CT abdomen pelvis showed left perinephritic hematoma with nephrolith fragments. UA showed +RBC's. Hemoglobin 9.5 on admission, down from 11.6 from 09/06/2018. Urology recommended to monitor CBC and outpatient follow up. Stable hgb 9.9 on discharge. Discharged to continue previously prescribed percocet with increased frequency, q4h. needs repeat labs on  follow-up.  2. AKI- Cr on admission 2.7, baseline 1.1. Most likely in the setting of decreased fluid intake and dehydration. Improved with IV hydration. Cr at discharge had a mild bump to 1.6. Will need to recheck BMP at follow up.   3. Type 2 DM- held metformin at discharge due to AKI. Please recheck BMP and if kidney function has improved restart metformin.   4. Hypertension-patient had elevated systolic pressures in the 180s to 190s.  She has a history of hypertension which is required numerous medications and at one time she was on 3 antihypertensives at the same time.  Complained of dizziness and syncopal episode so she was tapered down and self discontinued all her medications in August 2019.  She was restarted on amlodipine 10 mg and pressures improved somewhat during hospital stay.  It was continued on amlodipine on discharge.  5. Migraine-patient was to be discharged however started having a migraine along with her elevated pressures.  She is a history of migraines and was treated as such with Benadryl and Reglan.  She had little improvement overnight and after speaking to the daughter who states Fioricet had helped in the past she was started on Fioricet.  She had little improvement with this medication.  Her headaches are improved during her hospitalization.  6. Depression-patient has a history of depression on sertraline 100 mg.  Over the course of her hospital stay she became more withdrawn on exam with minimal eye contact or interaction.  She was unable to articulate what was bothering her.  She refused to work with PT. she did have a history of gabapentin withdrawal in the past, was currently not on gabapentin.  We increased Zoloft to 150 mg and started gabapentin 400 mg 3 times daily.  Patient had improvement in her mental status and was more interactive. We think that this is most likely related to gabapentin withdrawal.  We continue gabapentin and continued her home dose of Zoloft at 100  mg daily.  7. Hypokalemia: Hemoglobin dropped down to 3.0 during hospitalization, this is replaced with K-Dur and discharge hemoglobin was 3.7.   Discharge Vitals:   BP (!) 94/57 (BP Location: Right Arm)   Pulse 72   Temp (!) 97.5 F (36.4 C) (Oral)   Resp 16   Ht 5\' 6"  (1.676 m)   Wt 80.2 kg   SpO2 97%   BMI 28.54 kg/m   Pertinent Labs, Studies, and Procedures:   CBC Latest Ref Rng & Units 10/01/2018 09/30/2018 09/29/2018  WBC 4.0 - 10.5 K/uL 8.2 8.2 10.1  Hemoglobin 12.0 - 15.0 g/dL 9.9(L) 9.8(L) 10.8(L)  Hematocrit 36.0 - 46.0 % 31.5(L) 30.8(L) 32.5(L)  Platelets 150 - 400 K/uL 230 218 211   BMP Latest Ref Rng & Units 10/02/2018 10/01/2018 09/30/2018  Glucose 70 - 99 mg/dL 186(H) 110(H) 147(H)  BUN 8 - 23 mg/dL 33(H) 21 19  Creatinine 0.44 - 1.00 mg/dL 1.62(H) 1.28(H) 1.26(H)  BUN/Creat Ratio 12 - 28 - - -  Sodium 135 -  145 mmol/L 135 139 139  Potassium 3.5 - 5.1 mmol/L 3.7 3.0(L) 3.4(L)  Chloride 98 - 111 mmol/L 104 106 107  CO2 22 - 32 mmol/L 21(L) 20(L) 17(L)  Calcium 8.9 - 10.3 mg/dL 8.8(L) 9.0 9.0   No results found.  Discharge Instructions: Discharge Instructions    AMB Referral to [facility]   Complete by:  As directed    Patient is a 68 y.o. female with history of COPD, T2DM, HTN, tobacco use, TIA and recent lithotripsy and stent placement for obstructive nephrolithiasis on L side (09/24/18) presenting with flank pain and nausea due to perinephritic hematoma. Patient has severe headache-feels like her migraines, nausea and HTN episodes during this admission treated with IV metoclopramide and diphenhydramine as well as IV Dilaudid for her abdominal pain. Per MD note, significant hypertension, likely related to her pain and migraine. Patient states using wrist BP device to check blood pressure at home and trying to follow diet restrictions. Also mentioned about "history of falls and sleeping on recliner due to neck injury".   Reason for consult:  Referral to  [facility] for complex disease management of HTN and issues maintaining blood pressure post discharge.   Diagnoses of:  Other   Other Diagnosis:  HTN, hx TIA   Expected date of contact:  1-3 days (reserved for hospital discharges)   Call MD for:  difficulty breathing, headache or visual disturbances   Complete by:  As directed    Call MD for:  extreme fatigue   Complete by:  As directed    Call MD for:  hives   Complete by:  As directed    Call MD for:  persistant dizziness or light-headedness   Complete by:  As directed    Call MD for:  persistant nausea and vomiting   Complete by:  As directed    Call MD for:  redness, tenderness, or signs of infection (pain, swelling, redness, odor or green/yellow discharge around incision site)   Complete by:  As directed    Call MD for:  severe uncontrolled pain   Complete by:  As directed    Call MD for:  temperature >100.4   Complete by:  As directed    Diet - low sodium heart healthy   Complete by:  As directed    Diet - low sodium heart healthy   Complete by:  As directed    Diet - low sodium heart healthy   Complete by:  As directed    Diet - low sodium heart healthy   Complete by:  As directed    Discharge instructions   Complete by:  As directed    Ms. Atkins,  You were hospitalized due to a perinephric hematoma, which is bleeding around your kidney. You were treated with pain control and we monitored your blood counts which are stable. I want you to follow up with the [facility] clinic on Monday so we can recheck your blood counts and kidney function. Please follow up with urology.   For your pain continue to take Percocet 1 tablet every 4 hours as needed.   I have also started you on a medication for your high blood pressure. Please start taking amlodipine 10 mg once a day. We will monitor your blood pressure in clinic.   I want you to hold your metformin until your hospital follow up appointment. Continue taking the rest of your  medications as prescribed.   Thank you for allowing [place]  to be a part of your care!   Increase activity slowly   Complete by:  As directed    Increase activity slowly   Complete by:  As directed    Increase activity slowly   Complete by:  As directed    Increase activity slowly   Complete by:  As directed      Signed: Asencion Noble, MD 10/04/2018, 1:34 PM

## 2018-09-29 ENCOUNTER — Other Ambulatory Visit: Payer: Self-pay | Admitting: Internal Medicine

## 2018-09-29 LAB — GLUCOSE, CAPILLARY
Glucose-Capillary: 147 mg/dL — ABNORMAL HIGH (ref 70–99)
Glucose-Capillary: 140 mg/dL — ABNORMAL HIGH (ref 70–99)
Glucose-Capillary: 132 mg/dL — ABNORMAL HIGH (ref 70–99)
Glucose-Capillary: 124 mg/dL — ABNORMAL HIGH (ref 70–99)
Glucose-Capillary: 126 mg/dL — ABNORMAL HIGH (ref 70–99)

## 2018-09-29 LAB — CBC
HCT: 32.5 % — ABNORMAL LOW (ref 36.0–46.0)
Hemoglobin: 10.8 g/dL — ABNORMAL LOW (ref 12.0–15.0)
MCH: 30.9 pg (ref 26.0–34.0)
MCHC: 33.2 g/dL (ref 30.0–36.0)
MCV: 92.9 fL (ref 80.0–100.0)
Platelets: 211 10*3/uL (ref 150–400)
RBC: 3.5 MIL/uL — ABNORMAL LOW (ref 3.87–5.11)
RDW: 14.5 % (ref 11.5–15.5)
WBC: 10.1 10*3/uL (ref 4.0–10.5)
nRBC: 0.2 % (ref 0.0–0.2)

## 2018-09-29 LAB — BASIC METABOLIC PANEL
Anion gap: 15 (ref 5–15)
BUN: 21 mg/dL (ref 8–23)
CO2: 16 mmol/L — ABNORMAL LOW (ref 22–32)
Calcium: 9.5 mg/dL (ref 8.9–10.3)
Chloride: 106 mmol/L (ref 98–111)
Creatinine, Ser: 1.46 mg/dL — ABNORMAL HIGH (ref 0.44–1.00)
GFR calc Af Amer: 43 mL/min — ABNORMAL LOW (ref >60)
GFR calc non Af Amer: 37 mL/min — ABNORMAL LOW (ref >60)
Glucose, Bld: 159 mg/dL — ABNORMAL HIGH (ref 70–99)
Potassium: 3.1 mmol/L — ABNORMAL LOW (ref 3.5–5.1)
Sodium: 137 mmol/L (ref 135–145)

## 2018-09-29 LAB — MAGNESIUM: Magnesium: 1.6 mg/dL — ABNORMAL LOW (ref 1.7–2.4)

## 2018-09-29 LAB — PHOSPHORUS: Phosphorus: 2.4 mg/dL — ABNORMAL LOW (ref 2.5–4.6)

## 2018-09-29 MED ORDER — ENSURE ENLIVE PO LIQD
237.0000 mL | Freq: Three times a day (TID) | ORAL | Status: DC
  Administered 2018-09-29 – 2018-10-02 (×7): 237 mL via ORAL

## 2018-09-29 MED ORDER — POTASSIUM CHLORIDE CRYS ER 20 MEQ PO TBCR
40.0000 meq | EXTENDED_RELEASE_TABLET | ORAL | Status: AC
  Administered 2018-09-29 (×2): 40 meq via ORAL
  Filled 2018-09-29 (×2): qty 2

## 2018-09-29 MED ORDER — MAGNESIUM SULFATE 2 GM/50ML IV SOLN
2.0000 g | Freq: Once | INTRAVENOUS | Status: AC
  Administered 2018-09-29: 2 g via INTRAVENOUS
  Filled 2018-09-29: qty 50

## 2018-09-29 MED ORDER — AMLODIPINE BESYLATE 10 MG PO TABS: 10.0000 mg | ORAL_TABLET | Freq: Every day | ORAL | 0 refills | Status: DC

## 2018-09-29 MED ORDER — OXYCODONE-ACETAMINOPHEN 5-325 MG PO TABS
1.0000 | ORAL_TABLET | ORAL | Status: DC | PRN
  Administered 2018-09-29: 2 via ORAL
  Administered 2018-09-30 – 2018-10-01 (×2): 1 via ORAL
  Administered 2018-10-02 (×2): 2 via ORAL
  Filled 2018-09-29 (×2): qty 2
  Filled 2018-09-29: qty 1
  Filled 2018-09-29: qty 2
  Filled 2018-09-29: qty 1

## 2018-09-29 MED ORDER — BUTALBITAL-APAP-CAFFEINE 50-325-40 MG PO TABS
2.0000 | ORAL_TABLET | ORAL | Status: DC | PRN
  Administered 2018-09-29: 17:00:00 2 via ORAL
  Filled 2018-09-29 (×2): qty 2

## 2018-09-29 MED ORDER — LACTATED RINGERS IV SOLN
INTRAVENOUS | Status: DC
  Administered 2018-09-29 (×2): via INTRAVENOUS

## 2018-09-29 MED ORDER — AMLODIPINE BESYLATE 10 MG PO TABS
10.0000 mg | ORAL_TABLET | Freq: Every day | ORAL | Status: DC
  Administered 2018-09-29 – 2018-10-02 (×4): 10 mg via ORAL
  Filled 2018-09-29 (×4): qty 1

## 2018-09-29 MED ORDER — SODIUM PHOSPHATES 45 MMOLE/15ML IV SOLN
20.0000 mmol | Freq: Once | INTRAVENOUS | Status: AC
  Administered 2018-09-29: 22:00:00 20 mmol via INTRAVENOUS
  Filled 2018-09-29 (×2): qty 6.67

## 2018-09-29 NOTE — Progress Notes (Signed)
  Date: 09/29/2018  Patient name: [name]  Medical record number: [id number]  Date of birth: 12/12/1950   I have seen and evaluated this patient and I have discussed the plan of care with the house staff. Please see their note for complete details. I concur with their findings with the following additions/corrections:   Difficult situation, she reports still feeling miserable today.  Unable to describe further what is actually wrong.  She nods yes when asked if she has ongoing migraine symptoms, nausea, and abdominal pain, but is unable to identify what, if any, interventions have helped any of these things.  Multiple nursing and PT notes indicate she is minimally interactive and unable to answer questions about her condition or participate in her care.  Perhaps this is prolonged functional debility from her migraine, we will continue trying to treat this today.  Otherwise, her renal function is improving and her hemoglobin is stable and we would hope to be able to get her home.  I wonder if her depression is playing a role in her current symptoms.  Lenice Pressman, M.D., Ph.D. 09/29/2018, 2:26 PM

## 2018-09-29 NOTE — Progress Notes (Signed)
RN paged that patient was confused. Evaluated at the bedside and she was alert and oriented x3. She was still complaining of a migraine. Denies any other pain. No shortness of breath, chest pain, n/v. States her abdominal pain is unchanged. She is still not sure what will help her headache. She does not know what helped her migraines in the past. States she has been up and out of bed. Normal urination and bowel movements.  Spoke with daughter on the phone and she states patient only has about one migraine a year. She stopped having migraines after her hysterectomy about 20 years ago but once in a while gets one. She states she took Fioricet and butalbital in the past which worked for her migraines. Discussed she is medically stable for discharge and to call and encourage the patient to work with PT.    Vitals:   09/29/18 0743 09/29/18 1157  BP: (!) 182/88 (!) 172/89  Pulse: 85 87  Resp: 16 16  Temp: 98.4 F (36.9 C) 98.4 F (36.9 C)  SpO2: 100% 98%    Physical Exam Gen: seen laying in bed, uncomfortable, continues to cover her face with her arm and not opening her eyes Abdomen: bowel sounds present, soft, ttp on left side Ext: no edema, warm and well perfused   Assessment/Plan: Patient alert and oriented. She looks improved from this morning as she is able to answer more questions and drinking ensure. Still unable to articulate why she feels so poorly. She continues to have a headache but denies any other symptoms. Mag and Ph are low, will replete. She is still medically stable for discharge today after repleting electrolytes. - Replete Mg and Ph - Fioricet for headache

## 2018-09-29 NOTE — Progress Notes (Addendum)
   Subjective: No overnight events. Patient reports she is not feeling well this morning but unable to articulate what is bothering her. States medications yesterday did not help her headache or nausea. She is continuing to have a headache and abdominal pain. When she has migraines at home she states she is miserable for a few days. She is not sure how we can help her. Advised her to work with PT and encourage PO intake this morning. She is amenable to both.   Objective:  Vital signs in last 24 hours: Vitals:   09/28/18 2047 09/28/18 2342 09/29/18 0347 09/29/18 0743  BP: (!) 182/69 (!) 182/71 (!) 173/83 (!) 182/88  Pulse: 85 83 79 85  Resp: 16 17 17 16   Temp: 98.1 F (36.7 C)  97.8 F (36.6 C) 98.4 F (36.9 C)  TempSrc: Oral  Oral Axillary  SpO2: 99% 99% 98% 100%  Weight:      Height:       Physical Exam Gen: seen laying in bed, no distress CV: RRR, no murmurs Pulm: CTA bilaterally, normal effort Abdomen: bowel sounds present, soft, ttp on left side Ext: no edema, warm and well perfused   Assessment/Plan:  Principal Problem:   AKI (acute kidney injury) (HCC) Active Problems:   Diabetes mellitus type 2, controlled, with complications (HCC)   Hypertension   Migraine   Perinephric hematoma   Acute blood loss anemia  [name] is a 68 yo F w/ PMH of COPD, T2DM, HTN, tobacco use, TIA and recent lithotripsy and stent placement for obstructive nephrolithiasis on L side (09/24/18) presenting with flank pain and nausea due to perinephritic hematoma.  Perinephritic Hematoma:L Perinephritic hematoma on CT abd/pelviswith nephrolith fragments.Lithotripsy and stent placed 09/24/18.Hgb 10.8 stable. -Pain control with percocet 5 mg 1-2 tablet q4H  XTK:[id number]. Cr improved 1.64,Baseline creatinine 1.1. - Avoid nephrotoxic meds  Migraine: Treated with benadryl and reglan yesterday with little improvement. Patient reports she is continuing to have a headache this morning.  Will observe. Encourage PO intake.  Hypertension: Given one dose of lisinopril and amlodipine yesterday. Due to AKI, will continue her amlodipine. - Amlodipine 10 mg daily   Hypokalemia: 3.1 this morning. Orally replete with 40 meq x2.   Dispo: Patient is medically stable for discharge today after IVF and working with PT.   Mike Craze, DO 09/29/2018, 7:47 AM Pager: [phone]

## 2018-09-29 NOTE — Progress Notes (Signed)
Messaged pharmacy 3 separate times to tube IV Sodium Phosphate. Have repeatedly checked bin and tubing station and have not located IV medicine.

## 2018-09-29 NOTE — Progress Notes (Signed)
PT Cancellation Note  Patient Details Name: [name] MRN: [id number] DOB: [birth year]   Cancelled Treatment:    Reason Eval/Treat Not Completed: Fatigue/lethargy limiting ability to participate. Pt with minimal eye opening throughout evaluation attempt. Pt would rouse to voice but only shaking head to indicate "yes/no". When asked if head still hurt pt nodded "yes". Max encouragement to participate however pt appears to be ignoring therapist, opening eyes enough to see if therapist was still there and immediately closing them again when I began talking. Will continue to follow.    Thelma Comp 09/29/2018, 12:35 PM   Rolinda Roan, PT, DPT Acute Rehabilitation Services Pager: [phone] Office: [phone]

## 2018-09-30 DIAGNOSIS — E876 Hypokalemia: Secondary | ICD-10-CM

## 2018-09-30 DIAGNOSIS — X58XXXA Exposure to other specified factors, initial encounter: Secondary | ICD-10-CM

## 2018-09-30 DIAGNOSIS — G43909 Migraine, unspecified, not intractable, without status migrainosus: Secondary | ICD-10-CM

## 2018-09-30 DIAGNOSIS — S37012A Minor contusion of left kidney, initial encounter: Secondary | ICD-10-CM

## 2018-09-30 DIAGNOSIS — F339 Major depressive disorder, recurrent, unspecified: Secondary | ICD-10-CM

## 2018-09-30 LAB — CBC
HCT: 30.8 % — ABNORMAL LOW (ref 36.0–46.0)
Hemoglobin: 9.8 g/dL — ABNORMAL LOW (ref 12.0–15.0)
MCH: 30.2 pg (ref 26.0–34.0)
MCHC: 31.8 g/dL (ref 30.0–36.0)
MCV: 94.8 fL (ref 80.0–100.0)
Platelets: 218 10*3/uL (ref 150–400)
RBC: 3.25 MIL/uL — ABNORMAL LOW (ref 3.87–5.11)
RDW: 14.6 % (ref 11.5–15.5)
WBC: 8.2 10*3/uL (ref 4.0–10.5)
nRBC: 0 % (ref 0.0–0.2)

## 2018-09-30 LAB — RENAL FUNCTION PANEL
Albumin: 2.8 g/dL — ABNORMAL LOW (ref 3.5–5.0)
Anion gap: 15 (ref 5–15)
BUN: 19 mg/dL (ref 8–23)
CO2: 17 mmol/L — ABNORMAL LOW (ref 22–32)
Calcium: 9 mg/dL (ref 8.9–10.3)
Chloride: 107 mmol/L (ref 98–111)
Creatinine, Ser: 1.26 mg/dL — ABNORMAL HIGH (ref 0.44–1.00)
GFR calc Af Amer: 52 mL/min — ABNORMAL LOW (ref >60)
GFR calc non Af Amer: 45 mL/min — ABNORMAL LOW (ref >60)
Glucose, Bld: 147 mg/dL — ABNORMAL HIGH (ref 70–99)
Phosphorus: 3.1 mg/dL (ref 2.5–4.6)
Potassium: 3.4 mmol/L — ABNORMAL LOW (ref 3.5–5.1)
Sodium: 139 mmol/L (ref 135–145)

## 2018-09-30 LAB — GLUCOSE, CAPILLARY
Glucose-Capillary: 134 mg/dL — ABNORMAL HIGH (ref 70–99)
Glucose-Capillary: 164 mg/dL — ABNORMAL HIGH (ref 70–99)

## 2018-09-30 LAB — MAGNESIUM: Magnesium: 1.9 mg/dL (ref 1.7–2.4)

## 2018-09-30 MED ORDER — POTASSIUM CHLORIDE CRYS ER 20 MEQ PO TBCR
40.0000 meq | EXTENDED_RELEASE_TABLET | Freq: Once | ORAL | Status: DC
  Filled 2018-09-30: qty 2

## 2018-09-30 MED ORDER — SERTRALINE HCL 50 MG PO TABS
150.0000 mg | ORAL_TABLET | Freq: Every day | ORAL | Status: DC
  Administered 2018-10-01 – 2018-10-02 (×2): 150 mg via ORAL
  Filled 2018-09-30 (×2): qty 1

## 2018-09-30 MED ORDER — POTASSIUM CHLORIDE CRYS ER 20 MEQ PO TBCR: 20.0000 meq | EXTENDED_RELEASE_TABLET | Freq: Once | ORAL | Status: DC

## 2018-09-30 MED ORDER — GABAPENTIN 400 MG PO CAPS
400.0000 mg | ORAL_CAPSULE | Freq: Three times a day (TID) | ORAL | Status: DC
  Administered 2018-09-30 – 2018-10-02 (×7): 400 mg via ORAL
  Filled 2018-09-30 (×7): qty 1

## 2018-09-30 NOTE — Progress Notes (Signed)
Internal Medicine Attending:   I saw and examined the patient. I reviewed the resident's note and I agree with the resident's findings and plan as documented in the resident's note. Patient continues to have poor eye contact she states just that she does not want to go home but all other issues are answered with "I do not know."  We discussed with her that medically she appears to be improving and we would typically discharge her home at this point.  Given her desponded nature though I think it would be a good idea to obtain an inpatient psychiatric consult before discharge this does not need to happen today but hopefully should happen tomorrow.  Blood pressure is improved AKI resolving  Agree with exam as documented we will add she has very poor eye contact and depressed affect.

## 2018-09-30 NOTE — Progress Notes (Signed)
PT Cancellation Note  Patient Details Name: [name] MRN: [id number] DOB: [birth year]   Cancelled Treatment:    Reason Eval/Treat Not Completed: Patient declined, no reason specified. Attempted to get pt to mobilize at 0900. Pt adamantly refused to get up, to reposition in bed, to get clean sheets and gown (pt was saturated with urine). When asked why she says, "I don't know". She denies HA at this point and replies "yes" when asked if she wants to go home but "no" to all other questions. Will attempt again later today.  Leighton Roach, [place]  Pager [phone] Office [place] 09/30/2018, 10:45 AM

## 2018-09-30 NOTE — Progress Notes (Signed)
PT Cancellation Note  Patient Details Name: Anabella Capshaw MRN: [id number] DOB: 29-Apr-1951   Cancelled Treatment:    Reason Eval/Treat Not Completed: Patient declined, no reason specified. Pt adamant that she will not get up but cannot verbalize why she doesn't want to, only that she doesn't feel good. Lengthy discussion about the need to eat and mobilize and pt reports that she knows but she still declines mobility.  Leighton Roach, [place]  Pager [phone] Office [place] 09/30/2018, 3:16 PM

## 2018-09-30 NOTE — Progress Notes (Signed)
Attempted to start IV on pt repeatedly throughout day but pt refused.

## 2018-09-30 NOTE — Progress Notes (Addendum)
   Subjective: Patient reports she is not feeling well this morning. She is still unable to articulate what is bothering her. She continues to answer "I don't know" to anything she is asked. She states she does not want to go home but will not say why. Unable to state if she feels more depression.   Objective:  Vital signs in last 24 hours: Vitals:   09/29/18 1620 09/29/18 1956 09/29/18 2313 09/30/18 0439  BP: 139/61 (!) 153/76 (!) 159/76 (!) 165/84  Pulse: 76 79 81 83  Resp: 16 18 18 18   Temp: (!) 97.5 F (36.4 C) 97.8 F (36.6 C) 97.7 F (36.5 C)   TempSrc: Axillary Oral Oral   SpO2: 97% 98% 95% 94%  Weight:      Height:       Physical Exam Gen: seen laying in bed, no distress, she is opening her eyes today Pulm: CTA bilaterally, normal effort Abdomen: bowel sounds present, no ttp, soft Ext: no edema, warm and well perfused  Assessment/Plan:  Principal Problem:   AKI (acute kidney injury) ([place]) Active Problems:   Diabetes mellitus type 2, controlled, with complications (HCC)   Hypertension   Migraine   Perinephric hematoma   Acute blood loss anemia  [name] is a 68 yo F w/ PMH of COPD, T2DM, HTN, tobacco use, TIA and recent lithotripsy and stent placement for obstructive nephrolithiasis on L side (09/24/18) presenting with flank pain and nausea due to perinephritic hematoma.L Perinephritic hematoma on CT abd/pelviswith nephrolith fragments.  Depression: Patient has a history of depression on sertraline 100 mg. She is withdrawn on exam for the past few days, continuing to say she feels poorly and unable to articulate what is bothering her. She refuses to work with PT and is not getting out of bed. This seems more behavioral in nature, maybe worsening depression. Will consult psychiatry - Consult psych - Increase Zoloft to 150 mg  - Per pcp she has had bad withdrawals to gabapentin, will restart today   Perinephritic Hematoma:Stable.Hgb9.8 stable.  -Pain  control withpercocet 5 mg 1-2 tablet q4H  VZD:GLOVFIEP.Cr improved1.26Baseline creatinine 1.1. - Avoid nephrotoxic meds  Migraine: Started on Fioricet, per daughter this helped her in the past for migraines.   Hypertension: 165/84 - Amlodipine 10 mg daily   Hypokalemia: 3.4 this morning. Orally replete with 20 meq  Dispo: Patient is medically stable for discharge today however is declining discharge. Will get a psych consult for further evaluation as this seems more behavorial in nature.   [name], Julia N, DO 09/30/2018, 7:13 AM Pager: [phone]
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
## 2018-10-01 ENCOUNTER — Ambulatory Visit: Payer: Medicare Other

## 2018-10-01 DIAGNOSIS — R32 Unspecified urinary incontinence: Secondary | ICD-10-CM

## 2018-10-01 LAB — CBC
HCT: 31.5 % — ABNORMAL LOW (ref 36.0–46.0)
Hemoglobin: 9.9 g/dL — ABNORMAL LOW (ref 12.0–15.0)
MCH: 29.6 pg (ref 26.0–34.0)
MCHC: 31.4 g/dL (ref 30.0–36.0)
MCV: 94 fL (ref 80.0–100.0)
Platelets: 230 10*3/uL (ref 150–400)
RBC: 3.35 MIL/uL — ABNORMAL LOW (ref 3.87–5.11)
RDW: 14.6 % (ref 11.5–15.5)
WBC: 8.2 10*3/uL (ref 4.0–10.5)
nRBC: 0 % (ref 0.0–0.2)

## 2018-10-01 LAB — GLUCOSE, CAPILLARY
Glucose-Capillary: 112 mg/dL — ABNORMAL HIGH (ref 70–99)
Glucose-Capillary: 318 mg/dL — ABNORMAL HIGH (ref 70–99)
Glucose-Capillary: 97 mg/dL (ref 70–99)
Glucose-Capillary: 72 mg/dL (ref 70–99)

## 2018-10-01 LAB — BASIC METABOLIC PANEL
Anion gap: 13 (ref 5–15)
BUN: 21 mg/dL (ref 8–23)
CO2: 20 mmol/L — ABNORMAL LOW (ref 22–32)
Calcium: 9 mg/dL (ref 8.9–10.3)
Chloride: 106 mmol/L (ref 98–111)
Creatinine, Ser: 1.28 mg/dL — ABNORMAL HIGH (ref 0.44–1.00)
GFR calc Af Amer: 51 mL/min — ABNORMAL LOW (ref >60)
GFR calc non Af Amer: 44 mL/min — ABNORMAL LOW (ref >60)
Glucose, Bld: 110 mg/dL — ABNORMAL HIGH (ref 70–99)
Potassium: 3 mmol/L — ABNORMAL LOW (ref 3.5–5.1)
Sodium: 139 mmol/L (ref 135–145)

## 2018-10-01 MED ORDER — INSULIN ASPART 100 UNIT/ML ~~LOC~~ SOLN
0.0000 [IU] | SUBCUTANEOUS | Status: DC
  Administered 2018-10-01: 5 [IU] via SUBCUTANEOUS
  Administered 2018-10-01: 21:00:00 11 [IU] via SUBCUTANEOUS
  Administered 2018-10-02: 09:00:00 2 [IU] via SUBCUTANEOUS
  Administered 2018-10-02 (×2): 8 [IU] via SUBCUTANEOUS

## 2018-10-01 MED ORDER — POTASSIUM CHLORIDE CRYS ER 20 MEQ PO TBCR
40.0000 meq | EXTENDED_RELEASE_TABLET | Freq: Two times a day (BID) | ORAL | Status: DC
  Filled 2018-10-01: qty 2

## 2018-10-01 MED ORDER — POTASSIUM CHLORIDE 20 MEQ PO PACK
40.0000 meq | PACK | Freq: Two times a day (BID) | ORAL | Status: AC
  Administered 2018-10-01: 12:00:00 40 meq via ORAL
  Filled 2018-10-01 (×2): qty 2
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
## 2018-10-01 NOTE — Progress Notes (Signed)
   Subjective: She does not remember why she is in the hospital. She does state that "we did a stent, I know that." She spoke with her daughter who visited yesterday. She denies recent stress and believes that everything at home is going well.  She has been refusing medications "because they tasted gross." She believes that this taste is new. She does report taking her medications at home including sertraline and gabapentin. She reports lack of appetite for the last several days. She was able to eat 2 containers of apple sauce and pudding today. She denies abdominal pain and dysphagia. She has had urinary incontinence. She has not walked around because she "feels weak". She denies any new complaints today.   Objective:  Vital signs in last 24 hours: Vitals:   09/30/18 1535 09/30/18 1936 09/30/18 2009 10/01/18 0006  BP: (!) 174/82 (!) 153/96 (!) 163/91 137/76  Pulse: 80 94 81 75  Resp: 16 18 18 18   Temp: 98.2 F (36.8 C) 98.8 F (37.1 C) 99.5 F (37.5 C) 98.9 F (37.2 C)  TempSrc: Axillary Oral Oral Oral  SpO2: 96% 99% 98% 95%  Weight:      Height:        General: Lying in bed in no acute distress Cardiac: Normal rate, regular rhythm Pulmonary: CTAB, normal WOB Abdomen: No TTP, normoactive BSs Extremity: No LE edema, warm Psychiatry: Flat affect and depressed mood    Assessment/Plan:  Principal Problem:   AKI (acute kidney injury) (HCC) Active Problems:   Diabetes mellitus type 2, controlled, with complications (HCC)   Hypertension   Migraine   Perinephric hematoma   Acute blood loss anemia   This is a 68 year old female with history of COPD, type 2 diabetes mellitus, hypertension, tobacco use, TIA and recent lithotripsy and stent placement for obstructive nephrolithiasis on left side who presented with flank pain and nausea due to perinephric hematoma.  She has been improving however has developed a very flat affect and depressed mood.  Depression: Patient is on  sertraline 100 mg daily and, she had withdrawals to gabapentin in the past and this was restarted yesterday.  She is refusing to work with PT and getting out of bed, her Zoloft was increased to 150 mg yesterday. She is more talkative today but is still having a flat affect and some depressed mood. She was able to talk with her daughter and her PCP was on the team today so this may have helped improve her mood. The restarting of her gabapentin may also have helped since lethergy and confusion are some side effects from withdrawal. Since she has had some improvement we will hold off on psych consult for now.  -Continue Zoloft 50 mg daily -Continue gabapentin 400 mg TID -If mood does not continue to improve will consult psychiatry  Perinephritic hematoma: Stable, hemoglobin today was 9.9. Denies any abdominal pain. Will continue to monitor.   Hypokalemia: -K today was down to 3.0, will replete. Repeat BMP in AM.   AKI: Improved, Ct today was 1.27, baseline around 1.1  Migraine: -Continue fioricet, per daughter this has helped her migraines  Hypertension: -Blood pressures have had some higher readings up to 163/91.  -Continue amlodipine 10 mg daily  FEN:  LR 200 mL/hr, replete lytes prn, carb mod diet  VTE ppx: SCDs due to perinephric hematoma Code Status: DNR    Dispo: Anticipated discharge is pending clinical improvement   Asencion Noble, MD 10/01/2018, 6:25 AM Pager: [phone]

## 2018-10-01 NOTE — Evaluation (Signed)
Physical Therapy Evaluation Patient Details Name: [name] MRN: [id number] DOB: 1950-07-14 Today's Date: 10/01/2018   History of Present Illness  [name] is a 68 yo F w/ PMH of COPD, T2DM, HTN, tobacco use, TIA and recent lithotripsy and stent placement for obstructive nephrolithiasis on L side (09/24/18) presenting with flank pain and nausea. [name] is a 68 yo F w/ PMH of COPD, T2DM, HTN, tobacco use, TIA and recent lithotripsy and stent placement for obstructive nephrolithiasis on L side (09/24/18) presenting with flank pain and nausea. Pt found to have L perinephric hematoma.   Clinical Impression  Pt participated with PT without difficulty today. It appears that [name] is near her baseline level of function. Pt sleeps in a recliner, uses RW for longer distance walking, and doesn't drive. Pt lives with several family members, all whom don't work who are available to assist patient if needed. Acute PT to cont to follow to progress ambulation tolerance.    Follow Up Recommendations No PT follow up;Supervision/Assistance - 24 hour    Equipment Recommendations  None recommended by PT    Recommendations for Other Services       Precautions / Restrictions Precautions Precautions: Fall Precaution Comments: potential psych issures Restrictions Weight Bearing Restrictions: No      Mobility  Bed Mobility Overal bed mobility: Modified Independent             General bed mobility comments: HOB elevated, used bed rail, increased time, pt reports "I sleep in a recliner at home"  Transfers Overall transfer level: Needs assistance Equipment used: None Transfers: Sit to/from Stand Sit to Stand: Min guard         General transfer comment: increased time, verbal cues to push up from bed, min guard for safety to steady during transition of hands from bed to RW, pt with intial posterior lean back onto bed, howevever pt hasn't been up in 5 days. pt then able to  steady herself without assist in RW  Ambulation/Gait Ambulation/Gait assistance: Min guard Gait Distance (Feet): 30 Feet Assistive device: Rolling walker (2 wheeled) Gait Pattern/deviations: Step-through pattern;Decreased stride length;Trunk flexed;Narrow base of support Gait velocity: dec Gait velocity interpretation: <1.31 ft/sec, indicative of household ambulator General Gait Details: stayed in room, minA for walker management, no episodes of either knee buckling  Stairs            Wheelchair Mobility    Modified Rankin (Stroke Patients Only)       Balance Overall balance assessment: Needs assistance Sitting-balance support: Single extremity supported;Feet supported Sitting balance-Leahy Scale: Good Sitting balance - Comments: pt able to don socks at EOB without falling forward   Standing balance support: Bilateral upper extremity supported Standing balance-Leahy Scale: Poor Standing balance comment: dependent on RW at this time                             Pertinent Vitals/Pain Pain Assessment: No/denies pain    Home Living Family/patient expects to be discharged to:: Private residence Living Arrangements: Children;Other relatives Available Help at Discharge: Family;Available 24 hours/day Type of Home: House Home Access: Stairs to enter Entrance Stairs-Rails: None(grabs banisters) Entrance Stairs-Number of Steps: 3 Home Layout: One level Home Equipment: Walker - 2 wheels;Cane - single point      Prior Function Level of Independence: Independent         Comments: sleeps in a recliner, uses RW for long distance ambulation, doesn't drive  Hand Dominance   Dominant Hand: Left    Extremity/Trunk Assessment   Upper Extremity Assessment Upper Extremity Assessment: Generalized weakness    Lower Extremity Assessment Lower Extremity Assessment: Generalized weakness    Cervical / Trunk Assessment Cervical / Trunk Assessment: Normal   Communication   Communication: No difficulties  Cognition Arousal/Alertness: Awake/alert Behavior During Therapy: Flat affect Overall Cognitive Status: No family/caregiver present to determine baseline cognitive functioning                                 General Comments: pt with flat affect but participated without encouragement, pt with tangental speech regarding family      General Comments General comments (skin integrity, edema, etc.): pt with R great toe amputation    Exercises     Assessment/Plan    PT Assessment Patient needs continued PT services  PT Problem List Decreased strength;Decreased activity tolerance;Decreased balance;Decreased mobility       PT Treatment Interventions DME instruction;Gait training;Stair training;Therapeutic activities;Functional mobility training;Therapeutic exercise;Balance training    PT Goals (Current goals can be found in the Care Plan section)  Acute Rehab PT Goals Patient Stated Goal: home PT Goal Formulation: With patient Time For Goal Achievement: 10/15/18 Potential to Achieve Goals: Good    Frequency Min 3X/week   Barriers to discharge        Co-evaluation               AM-PAC PT "6 Clicks" Mobility  Outcome Measure Help needed turning from your back to your side while in a flat bed without using bedrails?: None Help needed moving from lying on your back to sitting on the side of a flat bed without using bedrails?: None Help needed moving to and from a bed to a chair (including a wheelchair)?: None Help needed standing up from a chair using your arms (e.g., wheelchair or bedside chair)?: A Little Help needed to walk in hospital room?: A Little Help needed climbing 3-5 steps with a railing? : A Little 6 Click Score: 21    End of Session Equipment Utilized During Treatment: Gait belt Activity Tolerance: Patient tolerated treatment well Patient left: in chair;with call bell/phone within reach;with  chair alarm set Nurse Communication: Mobility status PT Visit Diagnosis: Unsteadiness on feet (R26.81);Muscle weakness (generalized) (M62.81);Difficulty in walking, not elsewhere classified (R26.2)    Time: 3790-2409 PT Time Calculation (min) (ACUTE ONLY): 32 min   Charges:   PT Evaluation $PT Eval Moderate Complexity: 1 Mod PT Treatments $Gait Training: 8-22 mins        Kittie Plater, PT, DPT Acute Rehabilitation Services Pager #: [phone] Office #: [phone]'   [place] 10/01/2018, 9:51 AM

## 2018-10-01 NOTE — Progress Notes (Signed)
Pt given insulin 11 units insulin. Pt given graham crackers, orange juice, fruit cups and Ensure after. CBG rechecked (see vitals). Pt alert and oriented x4. No acute distress noted.  Dr. [name] Odor called and notified. CBG checked frequently. Call light within reach, will continue to monitor.

## 2018-10-01 NOTE — Telephone Encounter (Signed)
Note in chart says this Rx was refilled 2 days ago by Dr Laural Golden

## 2018-10-01 NOTE — Progress Notes (Signed)
Pt refused PO potassium, DO notified. [name]

## 2018-10-02 ENCOUNTER — Telehealth: Payer: Self-pay

## 2018-10-02 DIAGNOSIS — Z7984 Long term (current) use of oral hypoglycemic drugs: Secondary | ICD-10-CM

## 2018-10-02 LAB — GLUCOSE, CAPILLARY
Glucose-Capillary: 126 mg/dL — ABNORMAL HIGH (ref 70–99)
Glucose-Capillary: 158 mg/dL — ABNORMAL HIGH (ref 70–99)
Glucose-Capillary: 257 mg/dL — ABNORMAL HIGH (ref 70–99)
Glucose-Capillary: 262 mg/dL — ABNORMAL HIGH (ref 70–99)
Glucose-Capillary: 282 mg/dL — ABNORMAL HIGH (ref 70–99)

## 2018-10-02 LAB — BASIC METABOLIC PANEL
Anion gap: 10 (ref 5–15)
BUN: 33 mg/dL — ABNORMAL HIGH (ref 8–23)
CO2: 21 mmol/L — ABNORMAL LOW (ref 22–32)
Calcium: 8.8 mg/dL — ABNORMAL LOW (ref 8.9–10.3)
Chloride: 104 mmol/L (ref 98–111)
Creatinine, Ser: 1.62 mg/dL — ABNORMAL HIGH (ref 0.44–1.00)
GFR calc Af Amer: 38 mL/min — ABNORMAL LOW (ref >60)
GFR calc non Af Amer: 33 mL/min — ABNORMAL LOW (ref >60)
Glucose, Bld: 186 mg/dL — ABNORMAL HIGH (ref 70–99)
Potassium: 3.7 mmol/L (ref 3.5–5.1)
Sodium: 135 mmol/L (ref 135–145)
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
## 2018-10-02 NOTE — Progress Notes (Signed)
Initial Nutrition Assessment   RD working remotely.  DOCUMENTATION CODES:   Not applicable  INTERVENTION:  Discontinue Ensure.  Encourage adequate PO intake.   NUTRITION DIAGNOSIS:   Increased nutrient needs related to chronic illness(COPD) as evidenced by estimated needs.  GOAL:   Patient will meet greater than or equal to 90% of their needs  MONITOR:   PO intake, Labs, Weight trends, I & O's, Skin  REASON FOR ASSESSMENT:   Consult Assessment of nutrition requirement/status  ASSESSMENT:   68 year old female with history of COPD, type 2 diabetes mellitus, hypertension, tobacco use, TIA and recent lithotripsy and stent placement for obstructive nephrolithiasis on left side who presented with flank pain and nausea due to perinephric hematoma.  Meal completion 100%. Appetite has been good. Pt has been able to eat and drink with no issues. No weight loss per weight records. Pt currently has Ensure ordered and reports dislike of them. RD to continue orders. Intake has been adequate. Plans for pt to discharge home today.    Unable to complete Nutrition-Focused physical exam at this time.   Labs and medications reviewed.   Diet Order:   Diet Order            Diet - low sodium heart healthy        Diet - low sodium heart healthy        Diet - low sodium heart healthy        Diet - low sodium heart healthy        Diet Carb Modified Fluid consistency: Thin; Room service appropriate? Yes  Diet effective now              EDUCATION NEEDS:   Not appropriate for education at this time  Skin:  Skin Assessment: Reviewed RN Assessment  Last BM:  4/18  Height:   Ht Readings from Last 1 Encounters:  09/28/18 5\' 6"  (1.676 m)    Weight:   Wt Readings from Last 1 Encounters:  10/02/18 80.2 kg    Ideal Body Weight:  59 kg  BMI:  Body mass index is 28.54 kg/m.  Estimated Nutritional Needs:   Kcal:  1800-2000  Protein:  85-100 grams  Fluid:  1.8 - 2  L/day    Corrin Parker, MS, RD, LDN Pager # [phone] After hours/ weekend pager # [phone]

## 2018-10-02 NOTE — Progress Notes (Signed)
Physical Therapy Treatment Patient Details Name: [name] MRN: [id number] DOB: 1950/10/09 Today's Date: 10/02/2018    History of Present Illness [name] is a 68 yo F w/ PMH of COPD, T2DM, HTN, tobacco use, TIA and recent lithotripsy and stent placement for obstructive nephrolithiasis on L side (09/24/18) presenting with flank pain and nausea. [name] is a 67 yo F w/ PMH of COPD, T2DM, HTN, tobacco use, TIA and recent lithotripsy and stent placement for obstructive nephrolithiasis on L side (09/24/18) presenting with flank pain and nausea. Pt found to have L perinephric hematoma.     PT Comments    Pt with increased stability and endurance this date. Pt appears to have impaired processing and sequencing as well as decreased insight to deficits and safety. Cont to recommend 24/7 assist upon d/c as pt at increased falls risk and demo's impaired cognition. However unsure if this is patient's baseline. Acute PT to cont to follow.   Follow Up Recommendations  No PT follow up;Supervision/Assistance - 24 hour     Equipment Recommendations  None recommended by PT    Recommendations for Other Services       Precautions / Restrictions Precautions Precautions: Fall Precaution Comments: potential psych issures Restrictions Weight Bearing Restrictions: No    Mobility  Bed Mobility Overal bed mobility: Modified Independent             General bed mobility comments: HOB elevated, used bed rail, increased time, pt reports "I sleep in a recliner at home"  Transfers Overall transfer level: Needs assistance Equipment used: None Transfers: Sit to/from Stand Sit to Stand: Min guard         General transfer comment: increased time, verbal cues to push up from bed, min guard for safety to steady during transition of hands from bed to RW, pt with intial posterior lean back onto bed, howevever pt hasn't been up in 5 days. pt then able to steady herself without assist in  RW  Ambulation/Gait Ambulation/Gait assistance: Min guard Gait Distance (Feet): 150 Feet Assistive device: Rolling walker (2 wheeled) Gait Pattern/deviations: Step-through pattern;Decreased stride length;Trunk flexed;Narrow base of support Gait velocity: dec Gait velocity interpretation: <1.31 ft/sec, indicative of household ambulator General Gait Details: verbal cues for walker management, pt with improved ability to stay in walker and increased endurance toelerance however very dependent on bilat UE.    Stairs             Wheelchair Mobility    Modified Rankin (Stroke Patients Only)       Balance Overall balance assessment: Needs assistance Sitting-balance support: No upper extremity supported;Feet supported Sitting balance-Leahy Scale: Good     Standing balance support: No upper extremity supported;During functional activity Standing balance-Leahy Scale: Fair Standing balance comment: pt leaned on sink to wash hands s/p tolieting                            Cognition Arousal/Alertness: Awake/alert Behavior During Therapy: Flat affect Overall Cognitive Status: No family/caregiver present to determine baseline cognitive functioning                                 General Comments: pt with tangetial speech, trying to explain the relationships to who is in her house but kept getting confused, pt with delayed processing and difficulty sequencing taking off underwear and washing of hands  Exercises      General Comments General comments (skin integrity, edema, etc.): pt assited to the bathroom, pt impulsively sat as PT was telling pt to pull down underwear, pt peed through mess panties and had no awrareness she had them on, pt indep with hygiene in sitting, pt required modA to power up off commode due to lower surface height      Pertinent Vitals/Pain Pain Assessment: No/denies pain    Home Living                      Prior  Function            PT Goals (current goals can now be found in the care plan section) Acute Rehab PT Goals Patient Stated Goal: home Progress towards PT goals: Progressing toward goals    Frequency    Min 3X/week      PT Plan Current plan remains appropriate    Co-evaluation              AM-PAC PT "6 Clicks" Mobility   Outcome Measure  Help needed turning from your back to your side while in a flat bed without using bedrails?: None Help needed moving from lying on your back to sitting on the side of a flat bed without using bedrails?: None Help needed moving to and from a bed to a chair (including a wheelchair)?: None Help needed standing up from a chair using your arms (e.g., wheelchair or bedside chair)?: A Little Help needed to walk in hospital room?: A Little Help needed climbing 3-5 steps with a railing? : A Little 6 Click Score: 21    End of Session Equipment Utilized During Treatment: Gait belt Activity Tolerance: Patient tolerated treatment well Patient left: in chair;with call bell/phone within reach;with chair alarm set Nurse Communication: Mobility status PT Visit Diagnosis: Unsteadiness on feet (R26.81);Muscle weakness (generalized) (M62.81);Difficulty in walking, not elsewhere classified (R26.2)     Time: 5449-2010 PT Time Calculation (min) (ACUTE ONLY): 31 min  Charges:  $Gait Training: 8-22 mins $Therapeutic Activity: 8-22 mins                     Kittie Plater, PT, DPT Acute Rehabilitation Services Pager #: [phone] Office #: [phone]    Berline Lopes 10/02/2018, 10:28 AM

## 2018-10-02 NOTE — Progress Notes (Signed)
Chaplain rec'd referral from nurse huddle. Patient may benefit from talking to someone.  Chaplain came to door but it was closed and it sounded as though staff was in with her. Will follow up later if able. Rev. Tamsen Snider Pager [phone]

## 2018-10-02 NOTE — Telephone Encounter (Signed)
Hospital TOC per Dr Maricela Bo, discharge 10/02/2018, telehealth appt 10/09/2018.

## 2018-10-02 NOTE — Care Management Important Message (Signed)
Important Message  Patient Details  Name: [name] MRN: [id number] Date of Birth: 1951-06-01   Medicare Important Message Given:  Yes    Orbie Pyo 10/02/2018, 2:21 PM
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
## 2018-10-02 NOTE — Discharge Instructions (Signed)
Ms. Julia, Flowers were hospitalized due to a perinephric hematoma, which is bleeding around your kidney. You were treated with pain control and we monitored your blood counts which are stable. I want you to follow up with the [facility] clinic on Monday so we can recheck your blood counts and kidney function. Please follow up with urology.   For your pain continue to take Percocet 1 tablet every 4 hours as needed.   I have also started you on a medication for your high blood pressure. Please start taking amlodipine 10 mg once a day. We will monitor your blood pressure in clinic.   I want you to hold your metformin until your hospital follow up appointment. Continue taking the rest of your medications as prescribed.   Thank you for allowing [place] to be a part of your care!

## 2018-10-03 ENCOUNTER — Other Ambulatory Visit: Payer: Self-pay | Admitting: *Deleted

## 2018-10-03 NOTE — Telephone Encounter (Signed)
Dr Sherry Ruffing,  Pt states her balance is off-Would she benefit from Physical Therapy (Out pt-not home health) Referral ?

## 2018-10-03 NOTE — Patient Outreach (Signed)
[facility]) Care Management  10/03/2018  [name] 09-23-1950 [id number]   Referral received from hospital liaison as she was recently admitted for kidney injury/hematoma of the kidney.  Per chart she has history of COPD, T2DM, HTN, tobacco use, TIA and recent lithotripsy and stent placement for obstructive nephrolithiasis on L side (09/24/18) presenting with flank pain and nausea due to perinephritic hematoma. Primary MD office will complete transition of care assessment.    Call placed to member, she state that this is not a good time to talk.  Request this care manager to call back tomorrow, no specific time.  Will follow up tomorrow for telephone assessment.  [name], [place], MSN [place] [phone]

## 2018-10-03 NOTE — Telephone Encounter (Signed)
Transition Care Management Follow-up Telephone Call   Date discharged? 10/02/18    How have you been since you were released from the hospital? "terrible" "my balance is still off"    Do you understand why you were in the hospital? No " I lost a week of my life"    Do you understand the discharge instructions? yes   Where were you discharged to? Home   Items Reviewed:  Medications reviewed: yes  Allergies reviewed: no  Dietary changes reviewed: no  Referrals reviewed: no   Functional Questionnaire:   Activities of Daily Living (ADLs):   She states they are independent in the following: continence -has bed side commode  States they require assistance with the following: ambulation, bathing and hygiene, feeding, continence, grooming, toileting and dressing   Any transportation issues/concerns?: no   Any patient concerns? Yes-off balanced    Confirmed importance and date/time of follow-up visits scheduled yes  Provider Appointment booked with Urology on 10/08/18 @ 1pm, [facility] for HFU 10/09/18 @0915 , Neurology 12/20/08 @ 3pm  Confirmed with patient if condition begins to worsen call PCP or go to the ER.  Patient was given the office number and encouraged to call back with question or concerns.  : yes

## 2018-10-04 ENCOUNTER — Other Ambulatory Visit: Payer: Self-pay | Admitting: *Deleted

## 2018-10-04 ENCOUNTER — Encounter: Payer: Self-pay | Admitting: *Deleted

## 2018-10-04 NOTE — Patient Outreach (Signed)
[facility]) Care Management  10/04/2018  [name] 1951/02/09 [id number]   Call placed member today for telephone assessment per her request.  Identity verified, stated purpose of call.  This care manager explained [facility] [place] care management services.  She immediately begin to express frustration regarding being discharged, feel as if she was sent home too soon.  State she is still unsteady on her feet and having a hard time moving around despite using her walker.  She does acknowledge that she was "mean and nasty" to the staff in the hospital and refused to accept offer for PT at discharge.  State she did have a follow up call from primary MD office and report her concerns, per note outpatient PT has been recommended/requested.  She also has a follow up appointment scheduled for 4/28 with the office.  Also aware of appointment with urologist on 4/27.  She is aware to contact MD with any blood in urine or unrelieved pain.  Medication changes reviewed, denies the need for medication assistance but would like more information on pill packaging.  Report she was taking Sertraline 100 mg but it was supposed to be increased to 150 mg, will ask MD about dose increase.    Report she is taking Sertraline due to history of depression.  State she is the only one in the home providing financially for all of her family (adult daughter and her significant other, granddaughter and her significant other, grandson, and great-grandson).  She moved to [place] from [place] after her husband of 40 years died in 10-03-11.  Since then, report one of her daughter's has stopped communicating with her.  Also report loss of other loved ones since husband's death (best friend of 40 years and a newborn grandson that she never had the chance to meet.)  Report also losing both of her parents within the last few years, months apart.  Denies going through grief counseling but is open to having social worker reach out to provide  resources.  She denies any urgent concerns at this time, advised to contact this care manager with questions.  Will follow up within the next 2 weeks.  THN CM Care Plan Problem One     Most Recent Value  Care Plan Problem One  Risk for hospitalization related to kidney injury as evidenced by recurrent admissions over the last month  Role Documenting the Problem One  Care Management [place] for Problem One  Active  [facility] Long Term Goal   Member will not be readmitted to the hospital within the next 45 days  THN Long Term Goal Start Date  10/04/18  Interventions for Problem One Long Term Goal  Discharge instructions reviewed with member, including medications list and MD follow up recommendations  THN CM Short Term Goal #1   Member will keep and attend follow up appointment with primary MD within the next week  [facility] CM Short Term Goal #1 Start Date  10/04/18  Interventions for Short Term Goal #1  Upcoming appointment reviewed with member, assessed need for transportation.      THN CM Care Plan Problem Two     Most Recent Value  Care Plan Problem Two  Diffiuculty managing chronic medical conditions due to depression as evidenced by PHQ 9 score of 12  Role Documenting the Problem Two  Care Management [place] for Problem Two  Active  THN CM Short Term Goal #1   Member will report plan to  manage depression within the next 2 weeks  THN CM Short Term Goal #1 Start Date  10/04/18  Interventions for Short Term Goal #2   Referral placed to CSW for depression management and resources     Valente David, [place], MSN [facility] Manager [phone]

## 2018-10-08 DIAGNOSIS — N201 Calculus of ureter: Secondary | ICD-10-CM | POA: Diagnosis not present

## 2018-10-09 ENCOUNTER — Telehealth: Payer: Self-pay | Admitting: Internal Medicine

## 2018-10-09 ENCOUNTER — Other Ambulatory Visit: Payer: Self-pay

## 2018-10-09 ENCOUNTER — Ambulatory Visit (INDEPENDENT_AMBULATORY_CARE_PROVIDER_SITE_OTHER): Payer: Medicare Other | Admitting: Internal Medicine

## 2018-10-09 DIAGNOSIS — S37019A Minor contusion of unspecified kidney, initial encounter: Secondary | ICD-10-CM

## 2018-10-09 NOTE — Progress Notes (Signed)
   [facility] Telephone Encounter  Reason for call:   This telephone encounter was created for Ms. Ethelean Colla Freeze on 10/09/2018 for the following purpose/cc HFU.   Pertinent Data:   Recent obstructing stone treated w/lithotripsy followed by admission for perinephric hematoma ROS: Pulmonary: pt denies increased work of breathing, shortness of breath,  Cardiac: pt denies palpitations, chest pain,   Abdominal: pt denies abdominal pain, nausea, vomiting, or diarrhea   Assessment / Plan / Recommendations:   HFU renal hematoma: went to urologist yesterday, they removed the stent, did no lab work.  Has been doing okay since they removed the stent.  Pain has improved.  She is eager to get involved with physical therapy to help her with balance and decompensation. She was seen by the physical therapist while in the hospital. Has not noticed any hematuria, dysuria or darkening of the urine before removal of stent (some expected on that day).  She is not on blood thinners and does not take a daily aspirin.  Reports normal urine output and can eat and drink well.    P: cbc, bmp she will get at [place] on [address], agree with PT (already in process)  As always, pt is advised that if symptoms worsen or new symptoms arise, they should go to an urgent care facility or to to ER for further evaluation.   Consent and Medical Decision Making:   Patient discussed with Dr. Dareen Piano  This is a telephone encounter between Arilyn Brierley and Vickki Muff on 10/09/2018 for HFU perinephric hematoma. The visit was conducted with the patient located at home and Vickki Muff at [facility]. The patient's identity was confirmed using their DOB and current address. The his/her legal guardian has consented to being evaluated through a telephone encounter and understands the associated risks (an examination cannot be done and the patient may need to come in for an appointment) /  benefits (allows the patient to remain at home, decreasing exposure to coronavirus). I personally spent 15 minutes on medical discussion.

## 2018-10-09 NOTE — Assessment & Plan Note (Signed)
   HFU renal hematoma: went to urologist yesterday, they removed the stent, did no lab work.  Has been doing okay since they removed the stent.  Pain has improved.  She is eager to get involved with physical therapy to help her with balance and decompensation. She was seen by the physical therapist while in the hospital. Has not noticed any hematuria, dysuria or darkening of the urine before removal of stent (some expected on that day).  She is not on blood thinners and does not take a daily aspirin.  Reports normal urine output and can eat and drink well.    P: cbc, bmp she will get at [place] on [address], agree with PT (already in process)  As always, pt is advised that if symptoms worsen or new symptoms arise, they should go to an urgent care facility or to to ER for further evaluation.

## 2018-10-09 NOTE — Telephone Encounter (Signed)
no answer left message will try again later this am

## 2018-10-10 ENCOUNTER — Telehealth: Payer: Self-pay | Admitting: *Deleted

## 2018-10-10 ENCOUNTER — Other Ambulatory Visit: Payer: Self-pay | Admitting: Internal Medicine

## 2018-10-10 ENCOUNTER — Other Ambulatory Visit: Payer: Self-pay | Admitting: *Deleted

## 2018-10-10 DIAGNOSIS — S37019A Minor contusion of unspecified kidney, initial encounter: Secondary | ICD-10-CM

## 2018-10-10 DIAGNOSIS — E1142 Type 2 diabetes mellitus with diabetic polyneuropathy: Secondary | ICD-10-CM

## 2018-10-10 NOTE — Progress Notes (Signed)
Internal Medicine Clinic Attending  Case discussed with Dr. Winfrey  at the time of the visit.  We reviewed the resident's history and exam and pertinent patient test results.  I agree with the assessment, diagnosis, and plan of care documented in the resident's note.  

## 2018-10-10 NOTE — Telephone Encounter (Signed)
Spoke with Dr. Shan Levans who will discuss this with her and decide if a u/a is necessary. Thank you

## 2018-10-10 NOTE — Telephone Encounter (Signed)
Discussed with pt she reports new LE edema, her urge incontinence is chronic worsened since she has had trouble with renal stones.  No dysuria, hematuria, no SOB or chest pain.  Will add urinalysis and spot urine prot/cr ratio.  Albumin quite low on last labs, hemoglobin also lower than normal, overall may have decreased oncotic pressure.  Repeat CBC and BMP already ordered to evaluate further as well.

## 2018-10-10 NOTE — Telephone Encounter (Signed)
Call from pt - stated she's having done tomorrow at [place] . She wants to know if an U/A can be added. Stated she's retaining fluid and incontinent of urine.Told her I would ask the doctor. Thanks

## 2018-10-11 ENCOUNTER — Encounter: Payer: Self-pay | Admitting: *Deleted

## 2018-10-11 ENCOUNTER — Other Ambulatory Visit: Payer: Self-pay | Admitting: *Deleted

## 2018-10-11 NOTE — Patient Outreach (Signed)
[facility]) Care Management  10/11/2018  [name] 22-Apr-1951 [id number]   Call placed to member to follow up on current medical condition and to complete initial assessment.  She report she is doing a little better.  Has some new swelling in her legs, denies shortness of breath.  Report she is still urinating as usual.  Has appointment with urology on Tuesday, had stent removed.  Does not have to follow up with them unless she begin to have issues again.  Still feel unsteady on her feet at times.  State she has had vertigo in the past and feel her equilibrium is off.  Has not takien any medications for it but will discuss with MD.  She is scheduled to have labs done today, will have another follow up once results are available.  Denies any urgent concerns, will follow up within the next 2 weeks.  THN CM Care Plan Problem One     Most Recent Value  Care Plan Problem One  Risk for hospitalization related to kidney injury as evidenced by recurrent admissions over the last month  Role Documenting the Problem One  Care Management Coordinator  Care Plan for Problem One  Active  [facility] Long Term Goal   Member will not be readmitted to the hospital within the next 45 days  THN Long Term Goal Start Date  10/04/18  Interventions for Problem One Long Term Goal  Discussed with member importance of continuing to follow plan of care (next step repeat labs) in effort to manage condition and decrease risk of readmission  THN CM Short Term Goal #1   Member will keep and attend follow up appointment with primary MD within the next week  THN CM Short Term Goal #1 Start Date  10/04/18  [facility] CM Short Term Goal #1 Met Date  10/11/18    [facility] CM Care Plan Problem Two     Most Recent Value  Care Plan Problem Two  Diffiuculty managing chronic medical conditions due to depression as evidenced by PHQ 9 score of 12  Role Documenting the Problem Two  Care Management Coordinator  Care Plan for  Problem Two  Active  THN CM Short Term Goal #1   Member will report plan to manage depression within the next 2 weeks  THN CM Short Term Goal #1 Start Date  10/04/18  Interventions for Short Term Goal #2   Advised member of assigned CSW, advised of importance of answering phone and/or calling back if message left.     Valente David, [place], MSN [facility] [phone]

## 2018-10-15 ENCOUNTER — Telehealth: Payer: Self-pay | Admitting: *Deleted

## 2018-10-15 DIAGNOSIS — S37019A Minor contusion of unspecified kidney, initial encounter: Secondary | ICD-10-CM | POA: Diagnosis not present

## 2018-10-15 NOTE — Patient Outreach (Signed)
[place] [facility]) Care Management  10/15/2018  [name] 02-04-1951 [id number]   CSW referral from [place], Valente David that patient reported history of depression, on medication but does not feel that alone is helping. Experienced multiple deaths of loved ones over the last 6-7 years, including husband of 45 years and best friend of 40 years. Also financially supporting everyone in her home. CSW called & spoke with patient to discuss concerns, patient reports that she has been taking zoloft since her husbands death but still the big events such as holidays, birthdays, & anniversaries have been difficult. CSW provided supportive listening and offered resources such as grief support groups or counseling but patient declined. Patient reports that she lives with daughter, grand-daughter, and her children. Patient states that it can be stressful at times but is glad she is not alone during this quarantine. Patient shared with CSW concerns regarding her recent hospitalization & CSW provided patient with the contact information to the Office of Patient Experience. Patient ended the call when she had a visitor ring the doorbell but asked that CSW check back in a few weeks.    Raynaldo Opitz, LCSW [facility]  Clinical Social Worker cell #: [phone]

## 2018-10-16 LAB — BMP8+ANION GAP
Anion Gap: 17 mmol/L (ref 10.0–18.0)
BUN/Creatinine Ratio: 13 (ref 12–28)
BUN: 14 mg/dL (ref 8–27)
CO2: 19 mmol/L — ABNORMAL LOW (ref 20–29)
Calcium: 9.6 mg/dL (ref 8.7–10.3)
Chloride: 106 mmol/L (ref 96–106)
Creatinine, Ser: 1.12 mg/dL — ABNORMAL HIGH (ref 0.57–1.00)
GFR calc Af Amer: 60 mL/min/{1.73_m2} (ref >59)
GFR calc non Af Amer: 52 mL/min/{1.73_m2} — ABNORMAL LOW (ref >59)
Glucose: 187 mg/dL — ABNORMAL HIGH (ref 65–99)
Potassium: 3.8 mmol/L (ref 3.5–5.2)
Sodium: 142 mmol/L (ref 134–144)

## 2018-10-16 LAB — URINALYSIS, ROUTINE W REFLEX MICROSCOPIC
Bilirubin, UA: NEGATIVE
Ketones, UA: NEGATIVE
Leukocytes,UA: NEGATIVE
Nitrite, UA: NEGATIVE
RBC, UA: NEGATIVE
Specific Gravity, UA: 1.011 (ref 1.005–1.030)
Urobilinogen, Ur: 0.2 mg/dL (ref 0.2–1.0)
pH, UA: 7 (ref 5.0–7.5)

## 2018-10-16 LAB — CBC
Hematocrit: 34.6 % (ref 34.0–46.6)
Hemoglobin: 10.8 g/dL — ABNORMAL LOW (ref 11.1–15.9)
MCH: 29.9 pg (ref 26.6–33.0)
MCHC: 31.2 g/dL — ABNORMAL LOW (ref 31.5–35.7)
MCV: 96 fL (ref 79–97)
Platelets: 181 10*3/uL (ref 150–450)
RBC: 3.61 x10E6/uL — ABNORMAL LOW (ref 3.77–5.28)
RDW: 13.9 % (ref 11.7–15.4)
WBC: 6.2 10*3/uL (ref 3.4–10.8)

## 2018-10-16 LAB — PROTEIN / CREATININE RATIO, URINE
Creatinine, Urine: 34.4 mg/dL
Protein, Ur: 15.4 mg/dL
Protein/Creat Ratio: 448 mg/g creat — ABNORMAL HIGH (ref 0–200)

## 2018-10-24 ENCOUNTER — Encounter: Payer: Self-pay | Admitting: Internal Medicine

## 2018-10-24 ENCOUNTER — Other Ambulatory Visit: Payer: Self-pay | Admitting: *Deleted

## 2018-10-24 NOTE — Patient Outreach (Signed)
[place] [facility] Care Management  10/24/2018  [name] Jun 24, 1950 [id number]   Call placed to member to follow up on current medical management.  She state she is "doing excellent."  Denies any more dizziness, denies any pain or discomfort.  Report recent extremity swelling has decreased, however she is still incontinent reportedly 90% of the time.  State she has been having this problem since she had renal stent placed, which has since been removed.  She has followed up with urology, but denied any concern for recurrent follow up. Denies reaching out to office recently to report incontinence, advised to do so and inquire about further instruction/guidance.  Report she will do so.  Denies any urgent concerns at this time, advised to contact this care manager with questions.  Will follow up within the next month.  [facility] CM Care Plan Problem One     Most Recent Value  Care Plan Problem One  Risk for hospitalization related to kidney injury as evidenced by recurrent admissions over the last month  Role Documenting the Problem One  Care Management [place] for Problem One  Active  [facility] Long Term Goal   Member will not be readmitted to the hospital within the next 45 days  [facility] Long Term Goal Start Date  10/04/18  Interventions for Problem One Long Term Goal  Educated member when to contact MD for concerns versus when to seek emergency medical attention in effort to decrease risk for readmission  [facility] CM Short Term Goal #1   Member will follow up with urologist within the next 3 weeks  [facility] CM Short Term Goal #1 Start Date  10/24/18  Interventions for Short Term Goal #1  Advised member to contact urologist to report concern for incontinence.  Confirmed member has contact information for MD    [facility] CM Care Plan Problem Two     Most Recent Value  Care Plan Problem Two  Diffiuculty managing chronic medical conditions due to depression as evidenced by PHQ 9 score of 12   Role Documenting the Problem Two  Care Management [place] for Problem Two  Active  [facility] CM Short Term Goal #1   Member will report plan to manage depression within the next 2 weeks  [facility] CM Short Term Goal #1 Start Date  10/04/18  [facility] CM Short Term Goal #1 Met Date   10/24/18     Valente David, RN, MSN [place] Manager [phone]

## 2018-10-26 ENCOUNTER — Telehealth: Payer: Self-pay

## 2018-10-26 NOTE — Telephone Encounter (Signed)
Requesting to speak with a nurse about getting antibiotic med for abscess tooth.

## 2018-10-26 NOTE — Telephone Encounter (Signed)
Called pt - requesting an abx for a tooth abscess. Informed she will need to come in for an appt ; she stated she prefers not to come in, she has not been out of her house d/t covid-19 and her health problems. Telehealth visit scheduled on Monday 5/18 @ 1445PM.

## 2018-10-27 NOTE — Telephone Encounter (Signed)
Thank you, she may need to check with a dentist as well

## 2018-10-29 ENCOUNTER — Encounter: Payer: Self-pay | Admitting: Internal Medicine

## 2018-10-29 ENCOUNTER — Other Ambulatory Visit: Payer: Self-pay | Admitting: Internal Medicine

## 2018-10-29 ENCOUNTER — Ambulatory Visit (INDEPENDENT_AMBULATORY_CARE_PROVIDER_SITE_OTHER): Payer: Medicare Other | Admitting: Internal Medicine

## 2018-10-29 ENCOUNTER — Other Ambulatory Visit: Payer: Self-pay

## 2018-10-29 DIAGNOSIS — E118 Type 2 diabetes mellitus with unspecified complications: Secondary | ICD-10-CM | POA: Diagnosis not present

## 2018-10-29 DIAGNOSIS — Z7984 Long term (current) use of oral hypoglycemic drugs: Secondary | ICD-10-CM | POA: Diagnosis not present

## 2018-10-29 DIAGNOSIS — Z720 Tobacco use: Secondary | ICD-10-CM

## 2018-10-29 DIAGNOSIS — K029 Dental caries, unspecified: Secondary | ICD-10-CM | POA: Insufficient documentation

## 2018-10-29 MED ORDER — METFORMIN HCL ER (MOD) 1000 MG PO TB24: 1000.0000 mg | ORAL_TABLET | Freq: Every day | ORAL | 6 refills | Status: DC

## 2018-10-29 MED ORDER — AMOXICILLIN-POT CLAVULANATE 875-125 MG PO TABS: 1.0000 | ORAL_TABLET | Freq: Two times a day (BID) | ORAL | 0 refills | Status: AC

## 2018-10-29 NOTE — Progress Notes (Signed)
CC: evaluation of a tooth abscess   This is a telephone encounter between [name] and [name] on 10/29/2018 forevaluation of a tooth abscess  . The visit was conducted with the patient located at home and [name] at [facility]. The patient's identity was confirmed using their DOB and current address. The patient has consented to being evaluated through a telephone encounter and understands the associated risks (an examination cannot be done and the patient may need to come in for an appointment) / benefits (allows the patient to remain at home, decreasing exposure to coronavirus). I personally spent 17 minutes on medical discussion.   HPI:  [name] is a 68 y.o. with PMH as below.   Please see A&P for assessment of the patient's acute and chronic medical conditions.   Past Medical History:  Diagnosis Date  . Allergy   . Anemia   . Arthritis    "left knee" (09/08/2015)  . Asthma   . Blood transfusion without reported diagnosis 10/2017   anemic  . Brachial plexus disorders   . CHF (congestive heart failure) ([place])   . Chronic pain    neck pain, headache, neuropathy  . COPD (chronic obstructive pulmonary disease) ([place])    SEES ONLY DR. PATEL -   . Depression    "when my husband passed in 2013"  . Facial trauma 05/2018  . Fall 04/19/2018  . Gastric ulcer   . GERD (gastroesophageal reflux disease)   . History of kidney stones   . Hypertension   . Hypertriglyceridemia   . Migraine    "monthly" (09/08/2015)  . Neuromuscular disorder (HCC)    neuropathy  . Puncture wound of foot, right 05/17/2012   Tetanus shot 3 yrs ago at [facility] in [place], per pt report   . Stroke ([place])    TIAS    IN [place]   4 YRS AGO   . Type II diabetes mellitus ([place]) 2010   diagnosed around 2010, only ever on metformin   Review of Systems:  Refer to history of present illness and assessment and plans for pertinent review of systems, all others reviewed and negative   Assessment & Plan:   Type 2 Diabetes During recent hospitalization patient was noted to have an acute kidney injury.  At the time of discharge she was asked to hold off on taking metformin until there was documentation of improvement in her renal function.  She followed up for lab work on 5/4 which showed improvement of her creatinine.  They will resume metformin at her previous dose.   - resume metformin XR 1000 mg BID  - will ask front desk to schedule PCP follow up for comprehensive diabetes follow up   Tooth pain Patient is requesting an antibiotic for a "tooth abscess". It is a bottom side tooth, she's had pain in that tooth for the past five days and is requesting an antibiotic. She denies fever, difficulty swallowing.  She has had her teeth evaluated by dentistry in the past was told that she needed them to be removed but she was not able to afford the out of pocket cost of this.   - augmentin 875 mg BID x 10 day course - in person evaluation in [facility]   -It sounds like she will ultimately need to have the tooth removed, I will contact nurses regarding affordable dental options for her  See Encounters Tab for problem based charting.  Patient discussed with Dr. Angelia Mould

## 2018-10-29 NOTE — Assessment & Plan Note (Signed)
During recent hospitalization patient was noted to have an acute kidney injury.  At the time of discharge she was asked to hold off on taking metformin until there was documentation of improvement in her renal function.  She followed up for lab work on 5/4 which showed improvement of her creatinine.  They will resume metformin at her previous dose.   - resume metformin XR 1000 mg BID  - will ask front desk to schedule PCP follow up for comprehensive diabetes follow up

## 2018-10-29 NOTE — Progress Notes (Signed)
Internal Medicine Clinic Attending  Case discussed with Dr. Blum at the time of the visit.  We reviewed the resident's history and exam and pertinent patient test results.  I agree with the assessment, diagnosis, and plan of care documented in the resident's note. 

## 2018-10-29 NOTE — Assessment & Plan Note (Signed)
Patient is requesting an antibiotic for a "tooth abscess". It is a bottom side tooth, she's had pain in that tooth for the past five days and is requesting an antibiotic. She denies fever, difficulty swallowing.  She has had her teeth evaluated by dentistry in the past was told that she needed them to be removed but she was not able to afford the out of pocket cost of this.   - augmentin 875 mg BID x 10 day course - in person evaluation in [facility]   -It sounds like she will ultimately need to have the tooth removed, I will contact nurses regarding affordable dental options for her

## 2018-11-06 ENCOUNTER — Ambulatory Visit: Payer: Self-pay | Admitting: *Deleted

## 2018-11-08 ENCOUNTER — Telehealth: Payer: Self-pay

## 2018-11-08 NOTE — Telephone Encounter (Signed)
Patient had called in for any resources on help with Dental resources lower prices. Not able to help with this because patient has Medical Insurance,Patient understood [place], [place] C5/28/202011:08 AM

## 2018-11-12 NOTE — Progress Notes (Signed)
   CC: Hypertension and diabetes follow up  HPI:  [name] is a 68 y.o. with diabetes mellitus, hypretension, tobacco use disorder who presents for follow-up for her diabetes. Please see problem based charting for evaluation, assessment, and plan.  Past Medical History:  Diagnosis Date  . Allergy   . Anemia   . Arthritis    "left knee" (09/08/2015)  . Asthma   . Blood transfusion without reported diagnosis 10/2017   anemic  . Brachial plexus disorders   . CHF (congestive heart failure) ([place])   . Chronic pain    neck pain, headache, neuropathy  . COPD (chronic obstructive pulmonary disease) ([place])    SEES ONLY DR. PATEL -   . Depression    "when my husband passed in 2013"  . Facial trauma 05/2018  . Fall 04/19/2018  . Gastric ulcer   . GERD (gastroesophageal reflux disease)   . History of kidney stones   . Hypertension   . Hypertriglyceridemia   . Migraine    "monthly" (09/08/2015)  . Neuromuscular disorder (HCC)    neuropathy  . Puncture wound of foot, right 05/17/2012   Tetanus shot 3 yrs ago at [facility] in [place], per pt report   . Stroke ([place])    TIAS    IN [place]   4 YRS AGO   . Type II diabetes mellitus ([place]) 2010   diagnosed around 2010, only ever on metformin   Review of Systems:    Review of Systems  Constitutional: Negative for chills and fever.  Respiratory: Negative for cough.   Cardiovascular: Negative for chest pain.  Gastrointestinal: Negative for abdominal pain, nausea and vomiting.  Genitourinary: Positive for frequency.  Neurological: Positive for dizziness, tingling, sensory change and weakness (feet).   Physical Exam:  Vitals:   11/13/18 1330  BP: (!) 144/72  Pulse: 82  Temp: 99.5 F (37.5 C)  TempSrc: Oral  SpO2: 98%  Weight: 176 lb 9.6 oz (80.1 kg)  Height: 5\' 6"  (1.676 m)   Physical Exam  Constitutional: Appears well-developed and well-nourished. No distress.  HENT:  Head: Normocephalic and atraumatic.  Eyes:  Conjunctivae are normal.  Cardiovascular: Normal rate, regular rhythm and normal heart sounds.  Respiratory: Effort normal and breath sounds normal. No respiratory distress. No wheezes.  GI: Soft. Bowel sounds are normal. No distension. There is no tenderness.  Musculoskeletal: No edema.  Neurological: Is alert.  Skin: Not diaphoretic. No erythema.  Psychiatric: Normal mood and affect. Behavior is normal. Judgment and thought content normal.    Assessment & Plan:   See Encounters Tab for problem based charting.  Patient discussed with Dr. Evette Doffing

## 2018-11-13 ENCOUNTER — Ambulatory Visit (INDEPENDENT_AMBULATORY_CARE_PROVIDER_SITE_OTHER): Payer: Medicare Other | Admitting: Internal Medicine

## 2018-11-13 ENCOUNTER — Encounter: Payer: Self-pay | Admitting: Internal Medicine

## 2018-11-13 ENCOUNTER — Other Ambulatory Visit: Payer: Self-pay

## 2018-11-13 VITALS — BP 144/72 | HR 82 | Temp 99.5°F | Ht 66.0 in | Wt 176.6 lb

## 2018-11-13 DIAGNOSIS — I10 Essential (primary) hypertension: Secondary | ICD-10-CM

## 2018-11-13 DIAGNOSIS — Z79899 Other long term (current) drug therapy: Secondary | ICD-10-CM | POA: Diagnosis not present

## 2018-11-13 DIAGNOSIS — S37019A Minor contusion of unspecified kidney, initial encounter: Secondary | ICD-10-CM | POA: Diagnosis not present

## 2018-11-13 DIAGNOSIS — S37019D Minor contusion of unspecified kidney, subsequent encounter: Secondary | ICD-10-CM

## 2018-11-13 DIAGNOSIS — Z720 Tobacco use: Secondary | ICD-10-CM | POA: Diagnosis not present

## 2018-11-13 DIAGNOSIS — F17200 Nicotine dependence, unspecified, uncomplicated: Secondary | ICD-10-CM

## 2018-11-13 DIAGNOSIS — E1142 Type 2 diabetes mellitus with diabetic polyneuropathy: Secondary | ICD-10-CM

## 2018-11-13 DIAGNOSIS — E119 Type 2 diabetes mellitus without complications: Secondary | ICD-10-CM

## 2018-11-13 DIAGNOSIS — E118 Type 2 diabetes mellitus with unspecified complications: Secondary | ICD-10-CM

## 2018-11-13 DIAGNOSIS — K250 Acute gastric ulcer with hemorrhage: Secondary | ICD-10-CM

## 2018-11-13 DIAGNOSIS — Z7984 Long term (current) use of oral hypoglycemic drugs: Secondary | ICD-10-CM

## 2018-11-13 DIAGNOSIS — Z1231 Encounter for screening mammogram for malignant neoplasm of breast: Secondary | ICD-10-CM

## 2018-11-13 DIAGNOSIS — Z0000 Encounter for general adult medical examination without abnormal findings: Secondary | ICD-10-CM

## 2018-11-13 LAB — POCT GLYCOSYLATED HEMOGLOBIN (HGB A1C): Hemoglobin A1C: 7.9 % — AB (ref 4.0–5.6)

## 2018-11-13 LAB — GLUCOSE, CAPILLARY: Glucose-Capillary: 200 mg/dL — ABNORMAL HIGH (ref 70–99)

## 2018-11-13 MED ORDER — METFORMIN HCL ER (MOD) 1000 MG PO TB24: 1000.0000 mg | ORAL_TABLET | Freq: Two times a day (BID) | ORAL | 1 refills | Status: DC

## 2018-11-13 NOTE — Patient Instructions (Addendum)
It was a pleasure to see you today [name]. Please make the following changes:  -Please increase your metformin dose from 1000mg  once a day to twice a day  -Please eat a healthy diet and exercise -You can look into a free style libre glucose monitor if you would like to purchase for yourself  -Please work on decreasing the amount of cigarettes that you smoke daily with the techniques we discussed  -please get mammogram done   If you have any questions or concerns, please call our clinic at [phone] between 9am-5pm and after hours call [phone] and ask for the internal medicine resident on call. If you feel you are having a medical emergency please call 911.   Thank you, we look forward to help you remain healthy!  Lars Mage, MD Internal Medicine PGY2   Smoking Tobacco Information, Adult Smoking tobacco can be harmful to your health. Tobacco contains a poisonous (toxic), colorless chemical called nicotine. Nicotine is addictive. It changes the brain and can make it hard to stop smoking. Tobacco also has other toxic chemicals that can hurt your body and raise your risk of many cancers. How can smoking tobacco affect me? Smoking tobacco puts you at risk for:  Cancer. Smoking is most commonly associated with lung cancer, but can also lead to cancer in other parts of the body.  Chronic obstructive pulmonary disease (COPD). This is a long-term lung condition that makes it hard to breathe. It also gets worse over time.  High blood pressure (hypertension), heart disease, stroke, or heart attack.  Lung infections, such as pneumonia.  Cataracts. This is when the lenses in the eyes become clouded.  Digestive problems. This may include peptic ulcers, heartburn, and gastroesophageal reflux disease (GERD).  Oral health problems, such as gum disease and tooth loss.  Loss of taste and smell. Smoking can affect your appearance by causing:  Wrinkles.  Yellow or stained teeth,  fingers, and fingernails. Smoking tobacco can also affect your social life, because:  It may be challenging to find places to smoke when away from home. Many workplaces, [employer], hotels, and public places are tobacco-free.  Smoking is expensive. This is due to the cost of tobacco and the long-term costs of treating health problems from smoking.  Secondhand smoke may affect those around you. Secondhand smoke can cause lung cancer, breathing problems, and heart disease. Children of smokers have a higher risk for: ? Sudden infant death syndrome (SIDS). ? Ear infections. ? Lung infections. If you currently smoke tobacco, quitting now can help you:  Lead a longer and healthier life.  Look, smell, breathe, and feel better over time.  Save money.  Protect others from the harms of secondhand smoke. What actions can I take to prevent health problems? Quit smoking   Do not start smoking. Quit if you already do.  Make a plan to quit smoking and commit to it. Look for programs to help you and ask your health care provider for recommendations and ideas.  Set a date and write down all the reasons you want to quit.  Let your friends and family know you are quitting so they can help and support you. Consider finding friends who also want to quit. It can be easier to quit with someone else, so that you can support each other.  Talk with your health care provider about using nicotine replacement medicines to help you quit, such as gum, lozenges, patches, sprays, or pills.  Do not replace cigarette smoking with  electronic cigarettes, which are commonly called e-cigarettes. The safety of e-cigarettes is not known, and some may contain harmful chemicals.  If you try to quit but return to smoking, stay positive. It is common to slip up when you first quit, so take it one day at a time.  Be prepared for cravings. When you feel the urge to smoke, chew gum or suck on hard candy. Lifestyle  Stay  busy and take care of your body.  Drink enough fluid to keep your urine pale yellow.  Get plenty of exercise and eat a healthy diet. This can help prevent weight gain after quitting.  Monitor your eating habits. Quitting smoking can cause you to have a larger appetite than when you smoke.  Find ways to relax. Go out with friends or family to a movie or a restaurant where people do not smoke.  Ask your health care provider about having regular tests (screenings) to check for cancer. This may include blood tests, imaging tests, and other tests.  Find ways to manage your stress, such as meditation, yoga, or exercise. Where to find support To get support to quit smoking, consider:  Asking your health care provider for more information and resources.  Taking classes to learn more about quitting smoking.  Looking for local organizations that offer resources about quitting smoking.  Joining a support group for people who want to quit smoking in your local community.  Calling the smokefree.gov counselor helpline: 1-800-Quit-Now 450 685 1980) Where to find more information You may find more information about quitting smoking from:  HelpGuide.org: www.helpguide.org  https://hall.com/: smokefree.gov  American Lung Association: www.lung.org Contact a health care provider if you:  Have problems breathing.  Notice that your lips, nose, or fingers turn blue.  Have chest pain.  Are coughing up blood.  Feel faint or you pass out.  Have other health changes that cause you to worry. Summary  Smoking tobacco can negatively affect your health, the health of those around you, your finances, and your social life.  Do not start smoking. Quit if you already do. If you need help quitting, ask your health care provider.  Think about joining a support group for people who want to quit smoking in your local community. There are many effective programs that will help you to quit this  behavior. This information is not intended to replace advice given to you by your health care provider. Make sure you discuss any questions you have with your health care provider. Document Released: 06/14/2016 Document Revised: 07/19/2017 Document Reviewed: 06/14/2016 Elsevier Interactive Patient Education  2019 [employer].

## 2018-11-14 LAB — CBC
Hematocrit: 34.7 % (ref 34.0–46.6)
Hemoglobin: 11.4 g/dL (ref 11.1–15.9)
MCH: 30.4 pg (ref 26.6–33.0)
MCHC: 32.9 g/dL (ref 31.5–35.7)
MCV: 93 fL (ref 79–97)
Platelets: 177 10*3/uL (ref 150–450)
RBC: 3.75 x10E6/uL — ABNORMAL LOW (ref 3.77–5.28)
RDW: 13.9 % (ref 11.7–15.4)
WBC: 9 10*3/uL (ref 3.4–10.8)

## 2018-11-14 NOTE — Assessment & Plan Note (Signed)
The patient's blood pressure during this visit was 144/72. The patient is currently taking amlodipine 10 mg daily. His last blood pressure visits are   BP Readings from Last 3 Encounters:  11/13/18 (!) 144/72  10/02/18 (!) 94/57  09/24/18 118/73   The patient does not report palpitations, dizziness, chest pain, sob. The patient states that she has not been moving around as much as prior and that she has been eating a lot of packaged food.   Assessment and Plan Counseled the patient on lifestyle modifications (diet and exercise). Continue amlodipine 10mg  qd.

## 2018-11-14 NOTE — Assessment & Plan Note (Signed)
Patient currently smokes 4 cigarettes per day. She has smoked for past 50 years. Used to smoke 1-1.5ppd till about 6 years ago.   Assessment and plan  Counseled the patient about trying to cut down on smoking. She states that she does not wish to quit completely at this time.   -refused nicotine patch or gum

## 2018-11-14 NOTE — Assessment & Plan Note (Signed)
Patient was hospitalized from 4/14- 4/21 for a perinephric hematoma that developed after lithotripsy and stent placement for left-sided obstructive nephrolithiasis. Patient's hemoglobin it was last noted to be 10.8 in Oct 15, 2018.  Assessment and plan  Ordered cbc to follow up on hb. The patient's hb has improved from 9.8 during hospitalization to 11.4.

## 2018-11-14 NOTE — Assessment & Plan Note (Signed)
Ordered mammogram.

## 2018-11-14 NOTE — Assessment & Plan Note (Signed)
Patient states that she feels that her "feet feel wobbly and they don't hold her up". She has had these symptoms for the past 1.5 months. She feels that her legs are weak. Has tingling.   -continue gabapentin 400mg  tid  -continue using shoes given by podiatry

## 2018-11-14 NOTE — Assessment & Plan Note (Signed)
The patient's last a1c=7.3 in February 2020 and during this visit was 7.9.. The patient's home blood glucose measurements over the past month have ranged 100-200s.   The patient is currently taking metformin 1000mg  qd and Januvia 50 mg daily. The patient is compliant with medication.    Patient's weight changes of 9lbs over the past month.  Assessment and plan  The patient was supposed to be taking metformin 1000mg  twice daily, but she stopped using it twice daily when she noted diarrhea. The patient had also been taking Augmentin the same time she noted diarrhea. I anticipate that the diarrhea was secondary to her beta lactam use and she should resume taking metformin twice daily.   -Patient's metformin was increased from 1000mg  qd to bid -Will schedule opthalmology follow up for diabetic retinopathy evaluation

## 2018-11-14 NOTE — Progress Notes (Signed)
Internal Medicine Clinic Attending  Case discussed with Dr. Chundi at the time of the visit.  We reviewed the resident's history and exam and pertinent patient test results.  I agree with the assessment, diagnosis, and plan of care documented in the resident's note. 

## 2018-11-29 ENCOUNTER — Encounter: Payer: Self-pay | Admitting: *Deleted

## 2018-11-29 ENCOUNTER — Other Ambulatory Visit: Payer: Self-pay | Admitting: *Deleted

## 2018-11-29 NOTE — Patient Outreach (Signed)
[facility] [facility]) Care Management  11/29/2018  [name] 1950/08/22 [id number]   Call placed to member to follow up on current medical condition.  She report she is still having problems with her legs, has fallen again since our last discussion.  Denies contacting PCP office to inform them, denies injuries.  State there are times when her legs get weak and "give out."  She does have neuropathy and active with neurologist.  State she had testing done and was told that she has minimal nerve responses in her right leg.  She is taking Gabapentin, but state she does not feel the dose is high enough.  Reportedly was taking 2400 mg 3 times a day (possibly daily dose) but is only taking 400 mg 3 times a day currently.  She will discuss concerns with MD during next visit.  Report decreasing her Metformin dose, now back to taking twice a day.  Blood sugars elevated in the 200s, but state along with decreasing her dose she has not been adherent to her diabetic diet.  Report she will be more mindful of her diet.  Last follow up with PCP was on 6/2, will call to schedule follow up.  Denies any urgent concerns, state she is still dealing with incontinence which she has not discussed with her urologist.  She will call to report concerns and inquire about follow up.    Advise to contact this care manager with questions.  Will follow up within the next month.  [facility] CM Care Plan Problem One     Most Recent Value  Care Plan Problem One  Risk for hospitalization related to kidney injury as evidenced by recurrent admissions over the last month  Role Documenting the Problem One  Care Management [place] for Problem One  Active  [facility] Long Term Goal   Member will not be readmitted to the hospital within the next 45 days  [facility] Long Term Goal Start Date  10/04/18  [facility] Long Term Goal Met Date  11/29/18  [facility] CM Short Term Goal #1   Member will follow up with urologist within the next 3  weeks  [facility] CM Short Term Goal #1 Start Date  11/29/18 [Date reset, not met]  Interventions for Short Term Goal #1  Discussed issues with incontinence, advised to notify urologist and schedule appointment    [facility] CM Care Plan Problem Two     Most Recent Value  Care Plan Problem Two  Difficulty managing chronic medical conditions as evidenced by member's statement and recent hospitalization  Role Documenting the Problem Two  Care Management Coordinator  Care Plan for Problem Two  Active  Interventions for Problem Two Long Term Goal   Discussed fall precautions with member.  Upcoming appointments reviewed, advised of appointment with neurologist, advised of importance of keeping and discussing Gabapentin doses  [facility]N Long Term Goal  Member will report no falls within the next 31 days  [facility]N Long Term Goal Start Date  11/29/18  [facility] CM Short Term Goal #1   Member will report having follow up with PCP within the next 4 weeks  [facility] CM Short Term Goal #1 Start Date  11/29/18  Interventions for Short Term Goal #2   After visit summary reviewed with member from last MD visit, advised of recommended follow up within one month, advised to contact to schedule appointment     Valente David, RN, MSN [place] Manager [phone]

## 2018-12-03 ENCOUNTER — Other Ambulatory Visit: Payer: Self-pay | Admitting: Internal Medicine

## 2018-12-03 DIAGNOSIS — F322 Major depressive disorder, single episode, severe without psychotic features: Secondary | ICD-10-CM

## 2018-12-03 DIAGNOSIS — E118 Type 2 diabetes mellitus with unspecified complications: Secondary | ICD-10-CM

## 2018-12-04 ENCOUNTER — Encounter: Payer: Self-pay | Admitting: *Deleted

## 2018-12-10 ENCOUNTER — Other Ambulatory Visit: Payer: Self-pay | Admitting: Internal Medicine

## 2018-12-10 DIAGNOSIS — N390 Urinary tract infection, site not specified: Secondary | ICD-10-CM | POA: Diagnosis not present

## 2018-12-10 DIAGNOSIS — N3941 Urge incontinence: Secondary | ICD-10-CM | POA: Diagnosis not present

## 2018-12-10 DIAGNOSIS — E1142 Type 2 diabetes mellitus with diabetic polyneuropathy: Secondary | ICD-10-CM

## 2018-12-10 NOTE — Telephone Encounter (Signed)
gabapentin (NEURONTIN) 400 MG capsule, REFILL REQUEST @  [id number] - [place], [place] [phone] (Phone) [phone] (Fax)   Requesting the refill by today.

## 2018-12-11 ENCOUNTER — Other Ambulatory Visit: Payer: Self-pay | Admitting: Internal Medicine

## 2018-12-11 DIAGNOSIS — I10 Essential (primary) hypertension: Secondary | ICD-10-CM

## 2018-12-11 NOTE — Telephone Encounter (Signed)
Received refill request from pt's pharmacy for lisinopril, but medication no longer on list. Will send to pcp for review.  Please advise.[name], Julia Strother Cassady6/30/20209:12 AM

## 2018-12-13 ENCOUNTER — Ambulatory Visit: Payer: Medicare Other

## 2018-12-13 ENCOUNTER — Encounter: Payer: Self-pay | Admitting: Internal Medicine

## 2018-12-21 ENCOUNTER — Other Ambulatory Visit: Payer: Self-pay

## 2018-12-21 ENCOUNTER — Telehealth (INDEPENDENT_AMBULATORY_CARE_PROVIDER_SITE_OTHER): Payer: Medicare Other | Admitting: Neurology

## 2018-12-21 ENCOUNTER — Encounter: Payer: Self-pay | Admitting: Neurology

## 2018-12-21 VITALS — BP 105/57 | HR 85 | Ht 66.0 in | Wt 170.0 lb

## 2018-12-21 DIAGNOSIS — R55 Syncope and collapse: Secondary | ICD-10-CM | POA: Diagnosis not present

## 2018-12-21 DIAGNOSIS — I671 Cerebral aneurysm, nonruptured: Secondary | ICD-10-CM

## 2018-12-21 DIAGNOSIS — G629 Polyneuropathy, unspecified: Secondary | ICD-10-CM

## 2018-12-21 MED ORDER — GABAPENTIN 600 MG PO TABS: 600.0000 mg | ORAL_TABLET | Freq: Three times a day (TID) | ORAL | 3 refills | Status: DC
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
## 2018-12-22 ENCOUNTER — Telehealth: Payer: Self-pay | Admitting: Internal Medicine

## 2018-12-22 NOTE — Telephone Encounter (Signed)
   Reason for call:   I received a call from [name] at 9:40 AM indicating significant nausea and vomiting.   Pertinent Data:   Patient reports significant nausea and vomiting since 6:30 am. She has bee unable to keep anything down including a Zofran she tried to take. She denies abdominal pain, fevers, constipation or diarrhea. She did not eat anything new recently and ate fried chicken last night (well cooked to the best of her knowledge).   Assessment / Plan / Recommendations:   Nausea and vomiting, unclear etiology, possibly viral. Concern for significant vomiting and unable to take anti emetic that may lead to dehydration. Patient encourage to try gentle fluids and attmpt zofran again. If she is unable to keep anything down (espiecially Zofran) or zofran is not working by mid-day. She is advised to be seen at urgent care early this afternoon.   As always, pt is advised that if symptoms worsen or new symptoms arise, they should go to an urgent care facility or to to ER for further evaluation.   Julia Seat, MD   12/22/2018, 9:38 AM

## 2018-12-26 ENCOUNTER — Ambulatory Visit: Payer: Medicare Other | Admitting: Podiatry

## 2019-01-02 ENCOUNTER — Other Ambulatory Visit: Payer: Self-pay | Admitting: *Deleted

## 2019-01-02 NOTE — Patient Outreach (Signed)
[facility]) Care Management  01/02/2019  [name] 03/06/1951 [id number]   Call placed to member to follow up on current medical condition, no answer.  HIPAA compliant voice message left.  Will follow up within the next 3-4 business days.  Valente David, [place], MSN [place] [phone]

## 2019-01-07 ENCOUNTER — Other Ambulatory Visit: Payer: Self-pay | Admitting: *Deleted

## 2019-01-07 NOTE — Patient Outreach (Signed)
[facility]) Care Management  01/07/2019  [name] 08/31/1950 [id number]   Unsuccessful outreach #2.  Call placed to member to follow up on current medical condition, no answer.  HIPAA compliant voice message left.  Unsuccessful outreach letter sent.  Will follow up within the next 3-4 business days.  Valente David, [place], MSN [place] [phone]

## 2019-01-11 ENCOUNTER — Other Ambulatory Visit: Payer: Self-pay | Admitting: Internal Medicine

## 2019-01-11 ENCOUNTER — Other Ambulatory Visit: Payer: Self-pay | Admitting: *Deleted

## 2019-01-11 NOTE — Patient Outreach (Signed)
[facility]) Care Management  01/11/2019  [name] 14-Dec-1950 [id number]   Unsuccessful outreach attempt #3.  Call placed to member for follow up telephone assessment, no answer.  HIPAA compliant voice message left.  If no response by 8/5 will close due to inability to maintain contact.  Valente David, [place], MSN [place] [phone]

## 2019-01-15 ENCOUNTER — Ambulatory Visit (INDEPENDENT_AMBULATORY_CARE_PROVIDER_SITE_OTHER): Payer: Medicare Other

## 2019-01-15 ENCOUNTER — Encounter: Payer: Self-pay | Admitting: Podiatry

## 2019-01-15 ENCOUNTER — Ambulatory Visit (INDEPENDENT_AMBULATORY_CARE_PROVIDER_SITE_OTHER): Payer: Medicare Other | Admitting: Podiatry

## 2019-01-15 ENCOUNTER — Encounter

## 2019-01-15 ENCOUNTER — Other Ambulatory Visit: Payer: Self-pay

## 2019-01-15 VITALS — BP 116/60 | Temp 98.3°F

## 2019-01-15 DIAGNOSIS — E11621 Type 2 diabetes mellitus with foot ulcer: Secondary | ICD-10-CM

## 2019-01-15 DIAGNOSIS — L97519 Non-pressure chronic ulcer of other part of right foot with unspecified severity: Secondary | ICD-10-CM | POA: Diagnosis not present

## 2019-01-15 DIAGNOSIS — E1142 Type 2 diabetes mellitus with diabetic polyneuropathy: Secondary | ICD-10-CM | POA: Diagnosis not present

## 2019-01-15 MED ORDER — CEPHALEXIN 500 MG PO CAPS: 500.0000 mg | ORAL_CAPSULE | Freq: Four times a day (QID) | ORAL | 0 refills | Status: AC

## 2019-01-15 NOTE — Patient Instructions (Addendum)
DRESSING CHANGES RIGHT FOOT:  WEAR SURGICAL SHOE AT ALL TIMES    1. KEEP RIGHT  FOOT DRY AT ALL TIMES!!!!  2. CLEANSE ULCER WITH SALINE.  3. DAB DRY WITH GAUZE SPONGE.  4. APPLY A LIGHT AMOUNT OF IODOSORB GEL TO BASE OF ULCER.  5. APPLY OUTER DRESSING/BAND-AID AS INSTRUCTED.  6. WEAR SURGICAL SHOE DAILY AT ALL TIMES.  7. DO NOT WALK BAREFOOT!!!  8.  IF YOU EXPERIENCE ANY FEVER, CHILLS, NIGHTSWEATS, NAUSEA OR VOMITING, ELEVATED OR LOW BLOOD SUGARS, REPORT TO EMERGENCY ROOM.  9. IF YOU EXPERIENCE INCREASED REDNESS, PAIN, SWELLING, DISCOLORATION, ODOR, PUS, DRAINAGE OR WARMTH OF YOUR FOOT, REPORT TO EMERGENCY ROOM.  Diabetes Mellitus and Foot Care Foot care is an important part of your health, especially when you have diabetes. Diabetes may cause you to have problems because of poor blood flow (circulation) to your feet and legs, which can cause your skin to:  Become thinner and drier.  Break more easily.  Heal more slowly.  Peel and crack. You may also have nerve damage (neuropathy) in your legs and feet, causing decreased feeling in them. This means that you may not notice minor injuries to your feet that could lead to more serious problems. Noticing and addressing any potential problems early is the best way to prevent future foot problems. How to care for your feet Foot hygiene  Wash your feet daily with warm water and mild soap. Do not use hot water. Then, pat your feet and the areas between your toes until they are completely dry. Do not soak your feet as this can dry your skin.  Trim your toenails straight across. Do not dig under them or around the cuticle. File the edges of your nails with an emery board or nail file.  Apply a moisturizing lotion or petroleum jelly to the skin on your feet and to dry, brittle toenails. Use lotion that does not contain alcohol and is unscented. Do not apply lotion between your toes. Shoes and socks  Wear clean socks or stockings every  day. Make sure they are not too tight. Do not wear knee-high stockings since they may decrease blood flow to your legs.  Wear shoes that fit properly and have enough cushioning. Always look in your shoes before you put them on to be sure there are no objects inside.  To break in new shoes, wear them for just a few hours a day. This prevents injuries on your feet. Wounds, scrapes, corns, and calluses  Check your feet daily for blisters, cuts, bruises, sores, and redness. If you cannot see the bottom of your feet, use a mirror or ask someone for help.  Do not cut corns or calluses or try to remove them with medicine.  If you find a minor scrape, cut, or break in the skin on your feet, keep it and the skin around it clean and dry. You may clean these areas with mild soap and water. Do not clean the area with peroxide, alcohol, or iodine.  If you have a wound, scrape, corn, or callus on your foot, look at it several times a day to make sure it is healing and not infected. Check for: ? Redness, swelling, or pain. ? Fluid or blood. ? Warmth. ? Pus or a bad smell. General instructions  Do not cross your legs. This may decrease blood flow to your feet.  Do not use heating pads or hot water bottles on your feet. They may burn your skin.  If you have lost feeling in your feet or legs, you may not know this is happening until it is too late.  Protect your feet from hot and cold by wearing shoes, such as at the beach or on hot pavement.  Schedule a complete foot exam at least once a year (annually) or more often if you have foot problems. If you have foot problems, report any cuts, sores, or bruises to your health care provider immediately. Contact a health care provider if:  You have a medical condition that increases your risk of infection and you have any cuts, sores, or bruises on your feet.  You have an injury that is not healing.  You have redness on your legs or feet.  You feel burning  or tingling in your legs or feet.  You have pain or cramps in your legs and feet.  Your legs or feet are numb.  Your feet always feel cold.  You have pain around a toenail. Get help right away if:  You have a wound, scrape, corn, or callus on your foot and: ? You have pain, swelling, or redness that gets worse. ? You have fluid or blood coming from the wound, scrape, corn, or callus. ? Your wound, scrape, corn, or callus feels warm to the touch. ? You have pus or a bad smell coming from the wound, scrape, corn, or callus. ? You have a fever. ? You have a red line going up your leg. Summary  Check your feet every day for cuts, sores, red spots, swelling, and blisters.  Moisturize feet and legs daily.  Wear shoes that fit properly and have enough cushioning.  If you have foot problems, report any cuts, sores, or bruises to your health care provider immediately.  Schedule a complete foot exam at least once a year (annually) or more often if you have foot problems. This information is not intended to replace advice given to you by your health care provider. Make sure you discuss any questions you have with your health care provider. Document Released: 05/27/2000 Document Revised: 07/12/2017 Document Reviewed: 07/01/2016 Elsevier Patient Education  2020 [employer].

## 2019-01-16 ENCOUNTER — Other Ambulatory Visit: Payer: Self-pay | Admitting: *Deleted

## 2019-01-16 NOTE — Patient Outreach (Signed)
[facility]) Care Management  01/16/2019  [name] February 02, 1951 [id number]   No response from member after multiple unsuccessful outreach attempts and letter sent.  Will close case at this time due to inability to maintain contact.  Will notify member and primary MD of case closure.  Valente David, [place], MSN [name] [phone]

## 2019-01-16 NOTE — Progress Notes (Signed)
Subjective: Patient presents today for at-risk foot care. 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  Since her last visit with [place], she has been hospitalized for kidney stones. She relates complication of adverse effect of general anesthesia post procedure.   Today, she relates pain of her plantar callus right foot. She states she has seen two areas that may be open. She is still non-compliant with her diabetic shoe gear. She states she wears her flip flops in her house and her diabetic shoes when she has to leave home.  She denies any fever, chills, nightsweats, nausea or vomiting.  Lars Mage, MD is her PCP. Last visit was 11/13/2018.  Allergies  Allergen Reactions  . Anesthesia S-I-60 Other (See Comments)    Hallucinations.  Yvette Rack [Cyclobenzaprine] Other (See Comments)    Prolonged QTc to 571, tachycardia  . Soma [Carisoprodol] Itching and Rash     Objective: Vitals:   01/15/19 1653  BP: 116/60  Temp: 98.3 F (36.8 C)    Vascular Examination: Capillary refill time <3 seconds to remaining digits.  Dorsalis pedis pulses palpable b/l.  Posterior tibial pulses palpable b/l.  Digital hair absent to remaining digits.  Skin temperature gradient WNL  B/l.  Dermatological Examination:  Skin thin, shiny and atrophic b/l.  Toenails 1-5 left foot, 2-5 right discolored, thick, dystrophic with subungual debris and pain with palpation to nailbeds due to thickness of nails.  Ulceration located sub 5th metatarsal right foot.  Predebridement measurements carried out today of 2.5 x 2.5 cm with elevated hemorrhagic callus.  No surrounding erythema, no edema, no active drainage. There is an area of centralized flocculence.   Postdebridement measurements today are: 2.5 x 2.5 x 0.1 cm with small area of full thickness noted at 12 o'clock position.   No tracking, no tunneling, no probing to bone, no undermining, no active pus or purulence noted.  No deep  abscess and no odor was encountered.    Musculoskeletal: Muscle strength 5/5 to all LE muscle groups.  S/p hallux amputation right foot.  Neurological: Sensation diminished with 10 gram monofilament.  Vibratory sensation diminished b/l.  Xray right foot reveals no changes from December 2019 xray. No gas in tissues, no bone erosion of 5th metatarsal. Evidence of 5th met head resection and 1st ray amputation.  Assessment: 1. Painful onychomycosis toenails  1-5 left foot, 2-5 right 2. 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 NIDDM with peripheral neuropathy  Plan: 1.  Toenails 1-5 left foot, 2-5 right were debrided in length and girth without iatrogenic bleeding. 2.   Right foot ulcer was debrided and reactive hyperkeratoses and necrotic tissue was resected to the level of bleeding or viable tissue. Ulcer was cleansed with wound cleanser. Iodosorb Gel was applied to base of wound with light dressing. 3.   Xray right foot was performed and reviewed with patient. 4.   She has Darco shoe at home and was instructed to start wearing it on right foot until ulcer heals. 5.   Patient was given instructions on offloading and dressing change/aftercare and was instructed to call immediately if any signs or symptoms of infection arise.  6.   Patient instructed to report to emergency department with worsening appearance of ulcer/toe/foot, increased pain, foul odor, increased redness, swelling, drainage, fever, chills, nightsweats, nausea, vomiting, increased blood sugar. Patient/POA related understanding. Patient was also seen by Dr. Jacqualyn Posey on today. 7.  Rx for Keflex 500 mg po qid for 10  days. 7.    Follow up one week. 8.   Patient/POA to call should there be a concern in the interim.

## 2019-01-22 ENCOUNTER — Ambulatory Visit (INDEPENDENT_AMBULATORY_CARE_PROVIDER_SITE_OTHER): Payer: Medicare Other | Admitting: Podiatry

## 2019-01-22 ENCOUNTER — Encounter: Payer: Self-pay | Admitting: Podiatry

## 2019-01-22 ENCOUNTER — Other Ambulatory Visit: Payer: Self-pay | Admitting: *Deleted

## 2019-01-22 ENCOUNTER — Other Ambulatory Visit: Payer: Self-pay

## 2019-01-22 DIAGNOSIS — L97519 Non-pressure chronic ulcer of other part of right foot with unspecified severity: Secondary | ICD-10-CM | POA: Diagnosis not present

## 2019-01-22 DIAGNOSIS — E1142 Type 2 diabetes mellitus with diabetic polyneuropathy: Secondary | ICD-10-CM

## 2019-01-22 DIAGNOSIS — E11621 Type 2 diabetes mellitus with foot ulcer: Secondary | ICD-10-CM

## 2019-01-22 NOTE — Patient Instructions (Signed)
Diabetes Mellitus and Foot Care Foot care is an important part of your health, especially when you have diabetes. Diabetes may cause you to have problems because of poor blood flow (circulation) to your feet and legs, which can cause your skin to:  Become thinner and drier.  Break more easily.  Heal more slowly.  Peel and crack. You may also have nerve damage (neuropathy) in your legs and feet, causing decreased feeling in them. This means that you may not notice minor injuries to your feet that could lead to more serious problems. Noticing and addressing any potential problems early is the best way to prevent future foot problems. How to care for your feet Foot hygiene  Wash your feet daily with warm water and mild soap. Do not use hot water. Then, pat your feet and the areas between your toes until they are completely dry. Do not soak your feet as this can dry your skin.  Trim your toenails straight across. Do not dig under them or around the cuticle. File the edges of your nails with an emery board or nail file.  Apply a moisturizing lotion or petroleum jelly to the skin on your feet and to dry, brittle toenails. Use lotion that does not contain alcohol and is unscented. Do not apply lotion between your toes. Shoes and socks  Wear clean socks or stockings every day. Make sure they are not too tight. Do not wear knee-high stockings since they may decrease blood flow to your legs.  Wear shoes that fit properly and have enough cushioning. Always look in your shoes before you put them on to be sure there are no objects inside.  To break in new shoes, wear them for just a few hours a day. This prevents injuries on your feet. Wounds, scrapes, corns, and calluses  Check your feet daily for blisters, cuts, bruises, sores, and redness. If you cannot see the bottom of your feet, use a mirror or ask someone for help.  Do not cut corns or calluses or try to remove them with medicine.  If you  find a minor scrape, cut, or break in the skin on your feet, keep it and the skin around it clean and dry. You may clean these areas with mild soap and water. Do not clean the area with peroxide, alcohol, or iodine.  If you have a wound, scrape, corn, or callus on your foot, look at it several times a day to make sure it is healing and not infected. Check for: ? Redness, swelling, or pain. ? Fluid or blood. ? Warmth. ? Pus or a bad smell. General instructions  Do not cross your legs. This may decrease blood flow to your feet.  Do not use heating pads or hot water bottles on your feet. They may burn your skin. If you have lost feeling in your feet or legs, you may not know this is happening until it is too late.  Protect your feet from hot and cold by wearing shoes, such as at the beach or on hot pavement.  Schedule a complete foot exam at least once a year (annually) or more often if you have foot problems. If you have foot problems, report any cuts, sores, or bruises to your health care provider immediately. Contact a health care provider if:  You have a medical condition that increases your risk of infection and you have any cuts, sores, or bruises on your feet.  You have an injury that is not   healing.  You have redness on your legs or feet.  You feel burning or tingling in your legs or feet.  You have pain or cramps in your legs and feet.  Your legs or feet are numb.  Your feet always feel cold.  You have pain around a toenail. Get help right away if:  You have a wound, scrape, corn, or callus on your foot and: ? You have pain, swelling, or redness that gets worse. ? You have fluid or blood coming from the wound, scrape, corn, or callus. ? Your wound, scrape, corn, or callus feels warm to the touch. ? You have pus or a bad smell coming from the wound, scrape, corn, or callus. ? You have a fever. ? You have a red line going up your leg. Summary  Check your feet every day  for cuts, sores, red spots, swelling, and blisters.  Moisturize feet and legs daily.  Wear shoes that fit properly and have enough cushioning.  If you have foot problems, report any cuts, sores, or bruises to your health care provider immediately.  Schedule a complete foot exam at least once a year (annually) or more often if you have foot problems. This information is not intended to replace advice given to you by your health care provider. Make sure you discuss any questions you have with your health care provider. Document Released: 05/27/2000 Document Revised: 07/12/2017 Document Reviewed: 07/01/2016 Elsevier Patient Education  2020 [employer]  

## 2019-01-22 NOTE — Patient Outreach (Signed)
[facility]) Care Management  01/22/2019  [name] [place] [birth year] [id number]   Call received from member after receiving case closure letter due to inability to maintain contact.  She report she was not answering her phone due to multiple spam calls daily.  State she has been doing well, but does have another UTI.  She has been placed on antibiotics for this.  Denies any recent falls, more steady on her feet.  Was seen by podiatrist due to an ulcer/callus on her right foot, had it shaved down last week as well as her toenails trimmed.  Has another follow up today, she has been on antibiotics for the past week for this as well.  Blood sugars reportedly much better, less than 150, A1C - 7.9 as of June 2020.  Denies any urgent needs, assessed for further complex needs, discussed transition to health coach as she is stable at this time.  She agrees.    Will place referral to health coach, will notify primary MD of transition.  Valente David, [place], MSN [place] [phone]

## 2019-01-24 ENCOUNTER — Other Ambulatory Visit: Payer: Self-pay

## 2019-01-28 DIAGNOSIS — E119 Type 2 diabetes mellitus without complications: Secondary | ICD-10-CM | POA: Diagnosis not present

## 2019-01-28 DIAGNOSIS — H2513 Age-related nuclear cataract, bilateral: Secondary | ICD-10-CM | POA: Diagnosis not present

## 2019-01-28 DIAGNOSIS — H0288A Meibomian gland dysfunction right eye, upper and lower eyelids: Secondary | ICD-10-CM | POA: Diagnosis not present

## 2019-01-28 DIAGNOSIS — H532 Diplopia: Secondary | ICD-10-CM | POA: Diagnosis not present

## 2019-01-28 DIAGNOSIS — H02834 Dermatochalasis of left upper eyelid: Secondary | ICD-10-CM | POA: Diagnosis not present

## 2019-01-28 DIAGNOSIS — H02831 Dermatochalasis of right upper eyelid: Secondary | ICD-10-CM | POA: Diagnosis not present

## 2019-01-28 DIAGNOSIS — H40013 Open angle with borderline findings, low risk, bilateral: Secondary | ICD-10-CM | POA: Diagnosis not present

## 2019-01-28 DIAGNOSIS — H35013 Changes in retinal vascular appearance, bilateral: Secondary | ICD-10-CM | POA: Diagnosis not present

## 2019-01-28 DIAGNOSIS — H5000 Unspecified esotropia: Secondary | ICD-10-CM | POA: Diagnosis not present

## 2019-01-28 DIAGNOSIS — H0288B Meibomian gland dysfunction left eye, upper and lower eyelids: Secondary | ICD-10-CM | POA: Diagnosis not present

## 2019-01-28 DIAGNOSIS — H35033 Hypertensive retinopathy, bilateral: Secondary | ICD-10-CM | POA: Diagnosis not present

## 2019-01-28 DIAGNOSIS — H25013 Cortical age-related cataract, bilateral: Secondary | ICD-10-CM | POA: Diagnosis not present

## 2019-01-29 NOTE — Progress Notes (Signed)
Subjective:   [name] presents for continued care of ulceration of right foot.  Patient has been performing daily dressing changes to right foot daily utilizing Iodosorb Gel. Pt. denies any new complaints.  Patient denies any fever, chills, nightsweats, nausea or vomiting.   Current Outpatient Medications:  .  albuterol (PROVENTIL HFA;VENTOLIN HFA) 108 (90 Base) MCG/ACT inhaler, Inhale 1-2 puffs into the lungs every 6 (six) hours as needed for wheezing or shortness of breath., Disp: 1 Inhaler, Rfl: 5 .  amLODipine (NORVASC) 10 MG tablet, TAKE 1 TABLET BY MOUTH ONCE DAILY, Disp: 90 tablet, Rfl: 1 .  cefUROXime (CEFTIN) 500 MG tablet, TK 1 T PO BID, Disp: , Rfl:  .  gabapentin (NEURONTIN) 400 MG capsule, , Disp: , Rfl:  .  gabapentin (NEURONTIN) 600 MG tablet, Take 1 tablet (600 mg total) by mouth 3 (three) times daily., Disp: 270 tablet, Rfl: 3 .  JANUVIA 50 MG tablet, TAKE 1 TABLET(50 MG) BY MOUTH DAILY, Disp: 90 tablet, Rfl: 0 .  metFORMIN (GLUMETZA) 1000 MG (MOD) 24 hr tablet, Take 1 tablet (1,000 mg total) by mouth 2 (two) times a day., Disp: 180 tablet, Rfl: 1 .  pantoprazole (PROTONIX) 40 MG tablet, Take 1 tablet (40 mg total) by mouth 2 (two) times daily. TAKE 1 TABLET(40 MG) BY MOUTH twice per day.  Before 1st and last meal of the day. (Patient taking differently: Take 40 mg by mouth 2 (two) times daily before a meal. ), Disp: 60 tablet, Rfl: 12 .  rosuvastatin (CRESTOR) 40 MG tablet, TAKE 1 TABLET BY MOUTH EVERY DAY, Disp: 90 tablet, Rfl: 0 .  sertraline (ZOLOFT) 100 MG tablet, TAKE 1 TABLET(100 MG) BY MOUTH DAILY, Disp: 90 tablet, Rfl: 0 .  VICTOZA 18 MG/3ML SOPN, , Disp: , Rfl:    Allergies  Allergen Reactions  . Anesthesia S-I-60 Other (See Comments)    Hallucinations.  Yvette Rack [Cyclobenzaprine] Other (See Comments)    Prolonged QTc to 571, tachycardia  . Soma [Carisoprodol] Itching and Rash     Objective:   Vascular Examination: Capillary refill time <3  seconds x 10 digits.  Dorsalis pedis pulses palpable b/l.  Posterior tibial pulses palpable b/l.  Digital hair absent to remaining digits.  Skin temperature gradient WNL b/l.  Dermatological Examination: Skin thin, shiny and atrophic b/l.  Toenails 1-5 left, 2-5 right recently debrided.  Ulceration located submet head 5 right foot: Predebridement measurements carried out today of 2.5 x 2.5 cm.  No periulcerative erythema, no edema, no drainage.  No flocculence, no malodor.  Postdebridement measurements today are: 2.5 x 2.5 cm to level of dermis.  No tracking, no tunneling, no undermining. No probing to bone, no purulent drainage.  No deep abscess evident.  Musculoskeletal: Muscle strength 5/5 to all LE muscle groups.  S/p hallux amputation right foot  Neurological: Sensation diminished b/l with 10 gram monofilament.  Assessment:   1.  Diabetic Ulceration right foot, partial thickness 2.    NIDDM with peripheral neuropathy  Plan: 1. Ulcer was debrided and reactive hyperkeratoses and necrotic tissue was resected to the level of bleeding or viable tissue.  2. Continue surgical shoe right foot for one week. She may go back to her diabetic shoe after that.   3. Patient was given instructions on offloading and dressing change/aftercare and was instructed to call immediately if any signs or symptoms of infection arise.  4. Patient is to follow up 2 weeks. 5. Patient instructed to report  to emergency department with worsening appearance of ulcer/toe/foot, increased pain, foul odor, increased redness, swelling, drainage, fever, chills, nightsweats, nausea, vomiting, increased blood sugar.  6. Patient/POA related understanding.

## 2019-01-30 ENCOUNTER — Telehealth: Payer: Self-pay

## 2019-01-30 NOTE — Telephone Encounter (Signed)
Requesting to speak with a nurse about hands being very itching at night. Please call pt back.

## 2019-01-30 NOTE — Telephone Encounter (Signed)
Please have her come into acc to be evaluated. Thanks

## 2019-01-30 NOTE — Telephone Encounter (Signed)
Pt calls and states in the afternoon that her palms start itching and they are so bad itching she is afraid she will dig the skin off. She has tried heat, cold, creams for moisture, alcohol based solutions- none have helped. Ask her to please not used alcohol based solutions or drying type creams for that will dry her hands out more and itching might increase. Please advise

## 2019-01-31 NOTE — Telephone Encounter (Signed)
Pt was called / informed to schedule an appt to be eval per Dr Maricela Bo. Call transferred to front office. Appt scheduled tomorrow 8/21 in [facility].

## 2019-02-01 ENCOUNTER — Ambulatory Visit (INDEPENDENT_AMBULATORY_CARE_PROVIDER_SITE_OTHER): Payer: Medicare Other | Admitting: Internal Medicine

## 2019-02-01 ENCOUNTER — Other Ambulatory Visit: Payer: Self-pay

## 2019-02-01 ENCOUNTER — Other Ambulatory Visit: Payer: Self-pay | Admitting: Internal Medicine

## 2019-02-01 VITALS — BP 128/77 | HR 78 | Temp 97.5°F | Ht 66.0 in | Wt 180.5 lb

## 2019-02-01 DIAGNOSIS — Z7982 Long term (current) use of aspirin: Secondary | ICD-10-CM | POA: Diagnosis not present

## 2019-02-01 DIAGNOSIS — R748 Abnormal levels of other serum enzymes: Secondary | ICD-10-CM | POA: Diagnosis not present

## 2019-02-01 DIAGNOSIS — L853 Xerosis cutis: Secondary | ICD-10-CM

## 2019-02-01 DIAGNOSIS — L249 Irritant contact dermatitis, unspecified cause: Secondary | ICD-10-CM

## 2019-02-01 DIAGNOSIS — F17210 Nicotine dependence, cigarettes, uncomplicated: Secondary | ICD-10-CM | POA: Diagnosis not present

## 2019-02-01 DIAGNOSIS — K254 Chronic or unspecified gastric ulcer with hemorrhage: Secondary | ICD-10-CM

## 2019-02-01 MED ORDER — BETAMETHASONE DIPROPIONATE 0.05 % EX CREA: TOPICAL_CREAM | Freq: Two times a day (BID) | CUTANEOUS | 0 refills | Status: DC

## 2019-02-01 MED ORDER — CETIRIZINE HCL 5 MG PO TABS: 5.0000 mg | ORAL_TABLET | Freq: Every day | ORAL | 0 refills | Status: DC

## 2019-02-01 NOTE — Patient Instructions (Signed)
Thank you, Ms.British Weinberg Justo for allowing [place] to provide your care today. Today we discussed hand itchiness.    I have ordered liver enzymes labs for you. I will call if any are abnormal.    I have place a referrals to None   I have ordered the following tests: None  We changes the following medications: I prescribed betamethasone cream for your hand itchiness.  Please spread thin layer over the affected areas of your hands.  You can use the cream up to twice daily.  I also prescribed Zyrtec 5 mg daily.  If this medication is not covered by your insurance then you can get the over-the-counter strength.  Please follow-up in as needed.    Should you have any questions or concerns please call the internal medicine clinic at [phone].    Marianna Payment, D.O. [facility]

## 2019-02-02 LAB — HEPATIC FUNCTION PANEL
ALT: 19 IU/L (ref 0–32)
AST: 20 IU/L (ref 0–40)
Albumin: 4.7 g/dL (ref 3.8–4.8)
Alkaline Phosphatase: 90 IU/L (ref 39–117)
Bilirubin Total: 0.2 mg/dL (ref 0.0–1.2)
Bilirubin, Direct: 0.12 mg/dL (ref 0.00–0.40)
Total Protein: 7.2 g/dL (ref 6.0–8.5)

## 2019-02-03 DIAGNOSIS — L853 Xerosis cutis: Secondary | ICD-10-CM | POA: Insufficient documentation

## 2019-02-03 DIAGNOSIS — L249 Irritant contact dermatitis, unspecified cause: Secondary | ICD-10-CM | POA: Insufficient documentation

## 2019-02-03 NOTE — Assessment & Plan Note (Addendum)
Patient presents with an acute onset of pruritus of the dorsal and palmar aspects of her hands bilaterally. She states that her symptoms began about a week ago and believes it could be related to gardening, which she recently started during this time.  She states that the pruritus is worse at night and has made it difficult for her to fall asleep at times.  She occasionally wakes up in the morning with blood on her sheets due to scratching her hands.  She has never had symptoms like this before.  She has tried using lotion before going to bed each night with minimal success.  She denies changes in medications, detergents, soaps,or perfume.  She admits to using 2 pairs of gloves (rubber exam glove under normal gardening glove) while gardening due to concerns of poison ivy. She states that this causes her hands to become excessively sweaty.  Patient's hands appear dry, and diffusely erythematous with many excoriations. There is no evidence of swelling, exudate, papules or pustules. The rash is patchy and poorly demarcated. The patient likely has irritant dermatitis due to excessive moisture.  Plan: -Considering the the patient's symptoms are causing her distress and impacting her life I will prescribe betamethasone 0.05% -Prescribed Zyrtec daily for pruritus -Considering the patient recently had elevated AST and ALT in March 2020, I will repeat her liver function test to rule out pulmonary erythema.

## 2019-02-03 NOTE — Progress Notes (Signed)
   CC: Pruritic rash on hands  HPI:  [name] is a 68 y.o. female with a past medical history stated below and presents today for a one-week history of pruritic rash on hands. Please see problem based assessment and plan for additional details.   Past Medical History:  Diagnosis Date  . Allergy   . Anemia   . Arthritis    "left knee" (09/08/2015)  . Asthma   . Blood transfusion without reported diagnosis 10/2017   anemic  . Brachial plexus disorders   . CHF (congestive heart failure) ([place])   . Chronic pain    neck pain, headache, neuropathy  . COPD (chronic obstructive pulmonary disease) ([place])    SEES ONLY DR. PATEL -   . Depression    "when my husband passed in 2013"  . Facial trauma 05/2018  . Fall 04/19/2018  . Gastric ulcer   . GERD (gastroesophageal reflux disease)   . History of kidney stones   . Hypertension   . Hypertriglyceridemia   . Migraine    "monthly" (09/08/2015)  . Neuromuscular disorder (HCC)    neuropathy  . Puncture wound of foot, right 05/17/2012   Tetanus shot 3 yrs ago at [facility] in [place], per pt report   . Stroke ([place])    TIAS    IN [place]   4 YRS AGO   . Type II diabetes mellitus ([place]) 2010   diagnosed around 2010, only ever on metformin     Review of Systems: Review of Systems  Skin: Positive for itching and rash (hands).     Vitals:   02/01/19 1022  BP: 128/77  Pulse: 78  Temp: (!) 97.5 F (36.4 C)  TempSrc: Oral  SpO2: 100%  Weight: 180 lb 8 oz (81.9 kg)  Height: 5\' 6"  (1.676 m)     Physical Exam: Physical Exam  Constitutional: She is well-developed, well-nourished, and in no distress.  Skin: Laceration (excoriations) noted. No purpura noted. Rash is not papular, not maculopapular, not pustular and not vesicular. There is erythema (dorsal and palmar surface of bilteral hands).     Assessment & Plan:   See Encounters Tab for problem based charting.  Patient seen with Dr. Dareen Piano

## 2019-02-04 ENCOUNTER — Ambulatory Visit: Payer: Medicare Other | Admitting: Podiatry

## 2019-02-11 ENCOUNTER — Other Ambulatory Visit: Payer: Self-pay | Admitting: Internal Medicine

## 2019-02-11 NOTE — Telephone Encounter (Signed)
Gabapentin refill was refused by you Bonnita Nasuti. Was this by accident?

## 2019-02-12 ENCOUNTER — Telehealth: Payer: Self-pay | Admitting: *Deleted

## 2019-02-12 NOTE — Telephone Encounter (Signed)
Ok sounds good  thanks

## 2019-02-12 NOTE — Telephone Encounter (Signed)
No, dr Delice Lesch filled this 7/10 and had a telehealth visit with pt, originally pt was on1800mg  in am, 1200mg  at noon and 1800mg  at evening. Dr Delice Lesch filled 600mg  TID on 7/10. So that is the reasoning for refusing the 400mg .

## 2019-02-13 ENCOUNTER — Ambulatory Visit: Payer: Medicare Other | Admitting: Internal Medicine

## 2019-02-14 ENCOUNTER — Other Ambulatory Visit: Payer: Self-pay

## 2019-02-14 NOTE — Patient Outreach (Signed)
[facility]) Care Management  02/14/2019   [name] 03/10/1951 [id number]      Outreach attempt # 1 to the patient for initial assessment.  HIPAA provided by the patient.  The patient was able to complete the assessment.  Social: the patient lives in the home with her granddaughter her spouse and son. She states that her granddaughter Dollene Cleveland helps her in the home.  She states that she is independent/assist with her daily living. The patient states she has daily pain in her legs.  She rates that the pain at a 5/10.  She states that she has not found anything that helps with the pain.  She states that she has had a few falls in the past year and not sure why. She states that she does walk with her walker to ambulate. Discussed with her about fall precautions and she verbalized understanding. She has transportation to her appointments.  The equipment in the home includes: walker, CBG meter (ACCU-Check), bedside commode, and blood pressure cuff.  Conditions: Per chart review and conversation with the patient her medical history includes:  HTN, Migraines,Allergic rhinitis, Esophageal reflux, Diabetes type II, Peripheral neuropathy, Tobacco abuse, Major depressive disorder, and  H/O TIA.  The patient states that she has been a diabetic since 2000.  She states she does not check her blood sugars daily.  She will check it when she feels something is wrong.  Discussed the importance of checking her blood sugars.  She states that the last time she has had a low below 70 was about a year ago.  She states that she stays up to 2 am in the morning and she will get hungry and snack.  She does not eat a lot during the day.  She states she smokes less than a half pack of cigarettes or depending on how stressed she is.  Discussed smoking cessation.  She verbalized understanding.                                                                                                                                                                                      Medications:Per chart review and talking with the patient she is on nine medications. She did not express to me any problems paying for her medications.  She is able to manage her medications.  Advanced Directives: The patient declined information about an advanced directive.      Current Medications:  Current Outpatient Medications  Medication Sig Dispense Refill  . albuterol (PROVENTIL HFA;VENTOLIN HFA) 108 (90 Base) MCG/ACT inhaler Inhale 1-2 puffs into the lungs every 6 (six) hours as needed for wheezing or shortness of breath.  1 Inhaler 5  . amLODipine (NORVASC) 10 MG tablet TAKE 1 TABLET BY MOUTH ONCE DAILY 90 tablet 1  . aspirin EC 81 MG tablet Take 81 mg by mouth daily.    . cetirizine (ZYRTEC) 5 MG tablet Take 1 tablet (5 mg total) by mouth daily. As needed for hand itchiness. 30 tablet 0  . gabapentin (NEURONTIN) 600 MG tablet Take 1 tablet (600 mg total) by mouth 3 (three) times daily. 270 tablet 3  . JANUVIA 50 MG tablet TAKE 1 TABLET(50 MG) BY MOUTH DAILY 90 tablet 0  . pantoprazole (PROTONIX) 40 MG tablet TAKE 1 TABLET BY MOUTH TWICE DAILY, BEFORE FIRST AND LAST MEAL OF THE DAY 60 tablet 0  . rosuvastatin (CRESTOR) 40 MG tablet TAKE 1 TABLET BY MOUTH EVERY DAY 90 tablet 0  . sertraline (ZOLOFT) 100 MG tablet TAKE 1 TABLET(100 MG) BY MOUTH DAILY 90 tablet 0  . betamethasone dipropionate (DIPROLENE) 0.05 % cream Apply topically 2 (two) times daily. (Patient not taking: Reported on 02/14/2019) 30 g 0  . cefUROXime (CEFTIN) 500 MG tablet TK 1 T PO BID    . metFORMIN (GLUMETZA) 1000 MG (MOD) 24 hr tablet Take 1 tablet (1,000 mg total) by mouth 2 (two) times a day. 180 tablet 1  . VICTOZA 18 MG/3ML SOPN      No current facility-administered medications for this visit.     Functional Status:  In your present state of health, do you have any difficulty performing the following activities: 02/14/2019 02/01/2019  Hearing?  N N  Vision? Y Y  Comment can't afford to get glasses saw eye doctor the other day  Difficulty concentrating or making decisions? N N  Walking or climbing stairs? Y Y  Comment falls falls  Dressing or bathing? N N  Doing errands, shopping? N [place] and eating ? Y -  Using the Toilet? N -  In the past six months, have you accidently leaked urine? Y -  Comment sees a urologist -  Do you have problems with loss of bowel control? N -  Managing your Medications? N -  Managing your Finances? N -  Housekeeping or managing your Housekeeping? N -  Some recent data might be hidden    Fall/Depression Screening: Fall Risk  02/14/2019 02/01/2019 12/21/2018  Falls in the past year? 1 1 1   Comment - - -  Number falls in past yr: 1 1 1   Injury with Fall? 0 0 0  Comment - - -  Risk Factor Category  - - -  Risk for fall due to : Impaired balance/gait Impaired balance/gait;History of fall(s) Impaired balance/gait  Risk for fall due to: Comment - - -  Follow up Falls evaluation completed;Education provided Falls prevention discussed Falls evaluation completed  Comment - bending topples over.  Climbing steps -   PHQ 2/9 Scores 02/14/2019 02/01/2019 11/13/2018 10/04/2018 07/20/2018 06/18/2018 03/02/2018  PHQ - 2 Score 2 0 0 5 - 1 3  PHQ- 9 Score 8 - 2 12 - 5 10  Exception Documentation - - - - Patient refusal - -    Assessment: Patient will continue to benefit from health coach outreach for disease management and support. THN CM Care Plan Problem One     Most Recent Value  Care Plan Problem One  Knowledge deficit related to diease management as evidenced by a1c of 7.9 and health behaviors  Role Documenting the Problem One  Health Coach  Care Plan for Problem One  Active  THN Long Term Goal   In 90 days the patient will verbalize lowering her a1c 1-2 points  [facility] Long Term Goal Start Date  02/14/19  Interventions for Problem One Long Term Goal  Discussed checking blood sugars and the  importance, diet, encouraged exercise, encouraged medication adherence, discussed smoking cessation, will send educational materials to review.      Plan: RN Health Coach will provide ongoing education for patient on diabetes through phone calls and sending printed information to patient for further discussion.    RN Health Coach will send Booklet on diabetes.    RN Health Coach will send initial barriers letter, assessment, and care plan to primary care physician.    [place] will send Consent form.  Will follow up next month to make sure the patient received the educational material.  [place] will contact patient in the month of October and patient agrees to next outreach.   Lazaro Arms RN, BSN, [place] Direct Dial:  [phone]  Fax: [phone]

## 2019-02-18 NOTE — Progress Notes (Signed)
Internal Medicine Clinic Attending  I saw and evaluated the patient.  I personally confirmed the key portions of the history and exam documented by Dr. Coe and I reviewed pertinent patient test results.  The assessment, diagnosis, and plan were formulated together and I agree with the documentation in the resident's note.    

## 2019-03-01 ENCOUNTER — Telehealth: Payer: Self-pay | Admitting: Podiatry

## 2019-03-01 NOTE — Telephone Encounter (Signed)
Pt would like to come in next week for her pressure wound, please let me know where you would like me to put her.

## 2019-03-03 ENCOUNTER — Other Ambulatory Visit: Payer: Self-pay | Admitting: Internal Medicine

## 2019-03-03 DIAGNOSIS — F322 Major depressive disorder, single episode, severe without psychotic features: Secondary | ICD-10-CM

## 2019-03-03 NOTE — Progress Notes (Signed)
   CC: Diabetes mellitus follow up  HPI:  [name] is a 68 y.o. with htn, dm2, mdd, tobacco use disorder, and hx of gastric ulcer who presents for follow up of diabetes. Please see problem based charting for evaluation, assessment, and plan.  Past Medical History:  Diagnosis Date  . Allergy   . Anemia   . Arthritis    "left knee" (09/08/2015)  . Asthma   . Blood transfusion without reported diagnosis 10/2017   anemic  . Brachial plexus disorders   . CHF (congestive heart failure) ([place])   . Chronic pain    neck pain, headache, neuropathy  . COPD (chronic obstructive pulmonary disease) ([place])    SEES ONLY DR. PATEL -   . Depression    "when my husband passed in 2013"  . Facial trauma 05/2018  . Fall 04/19/2018  . Gastric ulcer   . GERD (gastroesophageal reflux disease)   . History of kidney stones   . Hypertension   . Hypertriglyceridemia   . Migraine    "monthly" (09/08/2015)  . Neuromuscular disorder (HCC)    neuropathy  . Puncture wound of foot, right 05/17/2012   Tetanus shot 3 yrs ago at [facility] in [place], per pt report   . Stroke ([place])    TIAS    IN [place]   4 YRS AGO   . Type II diabetes mellitus ([place]) 2010   diagnosed around 2010, only ever on metformin   Review of Systems:    Review of Systems  Constitutional: Negative for chills and fever.  Respiratory: Positive for cough. Negative for shortness of breath.   Cardiovascular: Negative for chest pain.  Gastrointestinal: Negative for abdominal pain, nausea and vomiting.  Neurological: Positive for headaches. Negative for dizziness.   Physical Exam:  Vitals:   03/05/19 0954  BP: 140/74  Pulse: 77  Temp: 98.1 F (36.7 C)  TempSrc: Oral  SpO2: 99%  Weight: 188 lb 3.2 oz (85.4 kg)   Physical Exam  Constitutional: She is oriented to person, place, and time. She appears well-developed and well-nourished. No distress.  HENT:  Head: Normocephalic and atraumatic.  Eyes: Conjunctivae are  normal.  Cardiovascular: Normal rate, regular rhythm and normal heart sounds.  Respiratory: Effort normal and breath sounds normal. No respiratory distress. She has no wheezes.  GI: Soft. Bowel sounds are normal. She exhibits no distension. There is no abdominal tenderness.  Musculoskeletal:        General: No edema.  Neurological: She is alert and oriented to person, place, and time.  Skin: Rash (1cm in size scaly raised lesion on forehead. Another 2cm in size scaly lesion with a small area of erythematous superficial laceration.) noted. She is not diaphoretic.  Psychiatric: She has a normal mood and affect. Her behavior is normal. Judgment and thought content normal.       Assessment & Plan:   See Encounters Tab for problem based charting.  Patient discussed with Dr. Lynnae January

## 2019-03-05 ENCOUNTER — Encounter: Payer: Self-pay | Admitting: Podiatry

## 2019-03-05 ENCOUNTER — Other Ambulatory Visit: Payer: Self-pay

## 2019-03-05 ENCOUNTER — Ambulatory Visit (INDEPENDENT_AMBULATORY_CARE_PROVIDER_SITE_OTHER): Payer: Medicare Other | Admitting: Podiatry

## 2019-03-05 ENCOUNTER — Ambulatory Visit (INDEPENDENT_AMBULATORY_CARE_PROVIDER_SITE_OTHER): Payer: Medicare Other | Admitting: Internal Medicine

## 2019-03-05 ENCOUNTER — Encounter: Payer: Self-pay | Admitting: Internal Medicine

## 2019-03-05 VITALS — BP 140/74 | HR 77 | Temp 98.1°F | Wt 188.2 lb

## 2019-03-05 DIAGNOSIS — L84 Corns and callosities: Secondary | ICD-10-CM | POA: Diagnosis not present

## 2019-03-05 DIAGNOSIS — E1142 Type 2 diabetes mellitus with diabetic polyneuropathy: Secondary | ICD-10-CM | POA: Diagnosis not present

## 2019-03-05 DIAGNOSIS — F322 Major depressive disorder, single episode, severe without psychotic features: Secondary | ICD-10-CM | POA: Diagnosis not present

## 2019-03-05 DIAGNOSIS — S98111A Complete traumatic amputation of right great toe, initial encounter: Secondary | ICD-10-CM | POA: Diagnosis not present

## 2019-03-05 DIAGNOSIS — G44311 Acute post-traumatic headache, intractable: Secondary | ICD-10-CM

## 2019-03-05 DIAGNOSIS — K250 Acute gastric ulcer with hemorrhage: Secondary | ICD-10-CM | POA: Diagnosis not present

## 2019-03-05 DIAGNOSIS — E119 Type 2 diabetes mellitus without complications: Secondary | ICD-10-CM

## 2019-03-05 DIAGNOSIS — E118 Type 2 diabetes mellitus with unspecified complications: Secondary | ICD-10-CM | POA: Diagnosis not present

## 2019-03-05 DIAGNOSIS — L989 Disorder of the skin and subcutaneous tissue, unspecified: Secondary | ICD-10-CM | POA: Diagnosis not present

## 2019-03-05 DIAGNOSIS — Z7984 Long term (current) use of oral hypoglycemic drugs: Secondary | ICD-10-CM | POA: Diagnosis not present

## 2019-03-05 DIAGNOSIS — I10 Essential (primary) hypertension: Secondary | ICD-10-CM

## 2019-03-05 LAB — GLUCOSE, CAPILLARY: Glucose-Capillary: 355 mg/dL — ABNORMAL HIGH (ref 70–99)

## 2019-03-05 LAB — POCT GLYCOSYLATED HEMOGLOBIN (HGB A1C): Hemoglobin A1C: 9.5 % — AB (ref 4.0–5.6)

## 2019-03-05 MED ORDER — SITAGLIPTIN PHOSPHATE 50 MG PO TABS: ORAL_TABLET | ORAL | 0 refills | Status: DC

## 2019-03-05 MED ORDER — BUTALBITAL-APAP-CAFFEINE 50-325-40 MG PO TABS: 1.0000 | ORAL_TABLET | Freq: Four times a day (QID) | ORAL | 0 refills | Status: DC | PRN

## 2019-03-05 MED ORDER — METFORMIN HCL ER (MOD) 1000 MG PO TB24: 1000.0000 mg | ORAL_TABLET | Freq: Two times a day (BID) | ORAL | 1 refills | Status: DC

## 2019-03-05 NOTE — Assessment & Plan Note (Signed)
  The patient states that she has had a scaly rash on her forehead for several years now that has been bothering her. Do not see in other areas of her body.  There are two lesions on the patient's forehead are raised, scaly, and pruritic. The patient mentioned that she has been picking at them. Applying lotion to the lesions has not helped. Patient states that she has a family history of skin cancer in both her parents.  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 They are in sun-exposed areas.   -referral to dermatology for biopsy vs cryotherapy

## 2019-03-05 NOTE — Assessment & Plan Note (Signed)
The patient's blood pressure during this visit was 140/74. The patient is currently taking amlodipine 10mg  qd. Her last blood pressure visits are   BP Readings from Last 3 Encounters:  03/05/19 140/74  02/01/19 128/77  01/15/19 116/60  The patient does not report palpitations, dizziness, chest pain, sob. Patient has gained 8lbs in the past 1 month.   Assessment and Plan Patient's blood pressure is relatively well controlled. Will continue current regimen- amlodipine 10mg  qd. Patient is to work on weight reduction as she has had a rapid weight gain over the past 1 month.

## 2019-03-05 NOTE — Patient Instructions (Signed)
Diabetes Mellitus and Foot Care Foot care is an important part of your health, especially when you have diabetes. Diabetes may cause you to have problems because of poor blood flow (circulation) to your feet and legs, which can cause your skin to:  Become thinner and drier.  Break more easily.  Heal more slowly.  Peel and crack. You may also have nerve damage (neuropathy) in your legs and feet, causing decreased feeling in them. This means that you may not notice minor injuries to your feet that could lead to more serious problems. Noticing and addressing any potential problems early is the best way to prevent future foot problems. How to care for your feet Foot hygiene  Wash your feet daily with warm water and mild soap. Do not use hot water. Then, pat your feet and the areas between your toes until they are completely dry. Do not soak your feet as this can dry your skin.  Trim your toenails straight across. Do not dig under them or around the cuticle. File the edges of your nails with an emery board or nail file.  Apply a moisturizing lotion or petroleum jelly to the skin on your feet and to dry, brittle toenails. Use lotion that does not contain alcohol and is unscented. Do not apply lotion between your toes. Shoes and socks  Wear clean socks or stockings every day. Make sure they are not too tight. Do not wear knee-high stockings since they may decrease blood flow to your legs.  Wear shoes that fit properly and have enough cushioning. Always look in your shoes before you put them on to be sure there are no objects inside.  To break in new shoes, wear them for just a few hours a day. This prevents injuries on your feet. Wounds, scrapes, corns, and calluses  Check your feet daily for blisters, cuts, bruises, sores, and redness. If you cannot see the bottom of your feet, use a mirror or ask someone for help.  Do not cut corns or calluses or try to remove them with medicine.  If you  find a minor scrape, cut, or break in the skin on your feet, keep it and the skin around it clean and dry. You may clean these areas with mild soap and water. Do not clean the area with peroxide, alcohol, or iodine.  If you have a wound, scrape, corn, or callus on your foot, look at it several times a day to make sure it is healing and not infected. Check for: ? Redness, swelling, or pain. ? Fluid or blood. ? Warmth. ? Pus or a bad smell. General instructions  Do not cross your legs. This may decrease blood flow to your feet.  Do not use heating pads or hot water bottles on your feet. They may burn your skin. If you have lost feeling in your feet or legs, you may not know this is happening until it is too late.  Protect your feet from hot and cold by wearing shoes, such as at the beach or on hot pavement.  Schedule a complete foot exam at least once a year (annually) or more often if you have foot problems. If you have foot problems, report any cuts, sores, or bruises to your health care provider immediately. Contact a health care provider if:  You have a medical condition that increases your risk of infection and you have any cuts, sores, or bruises on your feet.  You have an injury that is not   healing.  You have redness on your legs or feet.  You feel burning or tingling in your legs or feet.  You have pain or cramps in your legs and feet.  Your legs or feet are numb.  Your feet always feel cold.  You have pain around a toenail. Get help right away if:  You have a wound, scrape, corn, or callus on your foot and: ? You have pain, swelling, or redness that gets worse. ? You have fluid or blood coming from the wound, scrape, corn, or callus. ? Your wound, scrape, corn, or callus feels warm to the touch. ? You have pus or a bad smell coming from the wound, scrape, corn, or callus. ? You have a fever. ? You have a red line going up your leg. Summary  Check your feet every day  for cuts, sores, red spots, swelling, and blisters.  Moisturize feet and legs daily.  Wear shoes that fit properly and have enough cushioning.  If you have foot problems, report any cuts, sores, or bruises to your health care provider immediately.  Schedule a complete foot exam at least once a year (annually) or more often if you have foot problems. This information is not intended to replace advice given to you by your health care provider. Make sure you discuss any questions you have with your health care provider. Document Released: 05/27/2000 Document Revised: 07/12/2017 Document Reviewed: 07/01/2016 Elsevier Patient Education  2020 [employer]  

## 2019-03-05 NOTE — Assessment & Plan Note (Signed)
Patient's phq9 during this visit is 8. Patient states that she is doing well with her depressive symptoms.   -continue sertraline 100mg  qd

## 2019-03-05 NOTE — Assessment & Plan Note (Signed)
The patient's last a1c=7.9 in June 2020 and during today's visit it was 9.5. The patient's home blood glucose measurements over the past month have ranged 250-300s. The patient does/does not note episodes of hypoglycemia.   The patient is currently being prescribed victoza 1.2mg  qd, metformin 1000mg  bid, januvia 50mg  qd. The patient stopped taking victoza several months ago and has not been able to afford meformin for the past 1 month.    Patient has gained 8lbs in the past 1 month and she has been drinking lots of orange juice.   Assessment and plan  The patient's blood glucose levels are uncontrolled likely largely due to financial constraints from not being able to afford the medication. Provided patient with $10 in petty cash for the patient to get 90 supply of metformin and januvia. Follow up in 3 months.

## 2019-03-05 NOTE — Assessment & Plan Note (Signed)
Patient sates that she has been having more frequent headaches 3-4 times per week over the past few months. The headaches are present in the occipital region and are not aggravated by bright light or loud noise. The headaches last approximately 8 hrs and are somewhat relieved by acetaminophen.   Assessment and plan  The patient has a history of chronic headaches, but they appear to be more frequent. Based on the patient's description, the headaches seen to be tension type in nature.   -will prescribe #15 tablets of fioricet for 30 days

## 2019-03-05 NOTE — Assessment & Plan Note (Signed)
Patient had gastric ulcers in May 2019 and she has been on pantoprazole 40mg  bid since. She states that she is doing well. Denies nausea, vomiting, abdominal pani, dizziness. She had required a feraheme infusion secondary to the bleed.   -will recheck iron studies  -follow up with gastroenterology

## 2019-03-05 NOTE — Patient Instructions (Addendum)
It was a pleasure to see you today [name]. Please make the following changes:  For your headaches  start taking fioricet only when needed  For your diabetes Please pick up metformin and januvia  Please AVOID ORANGE [place]! CONTINUE TO WEAR THEM!  Follow up with gastroenterology  Please continue taking all of your other medications as seen on this form.   If you have any questions or concerns, please call our clinic at [phone] between 9am-5pm and after hours call [phone] and ask for the internal medicine resident on call. If you feel you are having a medical emergency please call 911.   Thank you, we look forward to help you remain healthy!  Lars Mage, MD Internal Medicine PGY3

## 2019-03-06 LAB — IRON AND TIBC
Iron Saturation: 16 % (ref 15–55)
Iron: 64 ug/dL (ref 27–139)
Total Iron Binding Capacity: 390 ug/dL (ref 250–450)
UIBC: 326 ug/dL (ref 118–369)

## 2019-03-06 LAB — FERRITIN: Ferritin: 72 ng/mL (ref 15–150)

## 2019-03-06 NOTE — Progress Notes (Signed)
Internal Medicine Clinic Attending  Case discussed with Dr. Maricela Bo at the time of the visit.  We reviewed the resident's history and exam and pertinent patient test results.  I agree with the assessment, diagnosis, and plan of care documented in the resident's note.  I agree with limiting Fioricet use to decrease the chance of medication overuse headache disorder.

## 2019-03-10 NOTE — Progress Notes (Signed)
Subjective:   [name] presents for continued care of ulceration right foot.  Patient states she missed her last appointment due to flat tire Pt. denies any new complaints.  Patient denies any fever, chills, nightsweats, nausea or vomiting.  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 albuterol (PROVENTIL HFA;VENTOLIN HFA) 108 (90 Base) MCG/ACT inhaler Inhale 1-2 puffs into the lungs every 6 (six) hours as needed for wheezing or shortness of breath. 1 Inhaler 5  . amLODipine (NORVASC) 10 MG tablet TAKE 1 TABLET BY MOUTH ONCE DAILY 90 tablet 1  . aspirin EC 81 MG tablet Take 81 mg by mouth daily.    . betamethasone dipropionate (DIPROLENE) 0.05 % cream Apply topically 2 (two) times daily. (Patient not taking: Reported on 02/14/2019) 30 g 0  . butalbital-acetaminophen-caffeine (FIORICET) 50-325-40 MG tablet Take 1-2 tablets by mouth every 6 (six) hours as needed for headache. 14 tablet 0  . cetirizine (ZYRTEC) 5 MG tablet Take 1 tablet (5 mg total) by mouth daily. As needed for hand itchiness. 30 tablet 0  . gabapentin (NEURONTIN) 600 MG tablet Take 1 tablet (600 mg total) by mouth 3 (three) times daily. 270 tablet 3  . metFORMIN (GLUMETZA) 1000 MG (MOD) 24 hr tablet Take 1 tablet (1,000 mg total) by mouth 2 (two) times daily with a meal. 180 tablet 1  . pantoprazole (PROTONIX) 40 MG tablet TAKE 1 TABLET BY MOUTH TWICE DAILY, BEFORE FIRST AND LAST MEAL OF THE DAY 60 tablet 0  . rosuvastatin (CRESTOR) 40 MG tablet TAKE 1 TABLET BY MOUTH EVERY DAY 90 tablet 0  . sertraline (ZOLOFT) 100 MG tablet TAKE 1 TABLET(100 MG) BY MOUTH DAILY 90 tablet 0  . sitaGLIPtin (JANUVIA) 50 MG tablet TAKE 1 TABLET(50 MG) BY MOUTH DAILY 90 tablet 0   No current facility-administered medications on file prior to visit.      Allergies  Allergen Reactions  . Anesthesia S-I-60 Other (See Comments)    Hallucinations.  Yvette Rack [Cyclobenzaprine] Other (See Comments)    Prolonged QTc to 571, tachycardia  . Soma [Carisoprodol] Itching and Rash     Objective:   Vascular Examination: Capillary refill time <3 seconds x 10 digits.  Dorsalis pedis pulses and Posterior tibial pulses palpable b/l.  Digital hair absent b/l.  Skin temperature gradient WNL b/l.  Dermatological Examination: Skin thin, shiny and atrophic b/l.  Toenails 1-5 left, 2-5 right discolored, thick, dystrophic with subungual debris and pain with palpation to nailbeds due to thickness of nails.  Ulceration located submet head 5 right foot: Predebridement measurements carried out today of 2.5 x 2.5 cm.  No periulcerative erythema, no edema, no drainage.  Flocculence absent.  Malodor absent.  Postdebridement, lesion is completely epithelialized.   Musculoskeletal: Muscle strength 5/5 to all LE muscle groups bilaterally.  S/p hallux amputation right foot.  Neurological: Sensation absent b/l bilaterally with 10 gram monofilament.  Assessment:   1. Callus submet head 5 right foot 2. S/p hallux amputation right foot 2.    NIDDM with peripheral neuropathy  Plan: Callus pared submetatarsal head 5 right foot utilizing sterile scalpel blade without incident. Patient to continue diabetic shoe gear daily.  Patient is to follow up 4 weeks. Patient instructed to report to emergency department with worsening appearance of ulcer/toe/foot, increased pain, foul odor, increased redness, swelling, drainage, fever, chills, nightsweats, nausea, vomiting, increased blood sugar.

## 2019-03-12 ENCOUNTER — Telehealth: Payer: Self-pay | Admitting: Internal Medicine

## 2019-03-12 NOTE — Telephone Encounter (Signed)
Pt seen on 03/05/2019.  Pt states she has not rec'd her Medication and has been waiting on a Prior Authorization on her medication.  Pt contacted her pharmacy last week.  butalbital-acetaminophen-caffeine (FIORICET) 50-325-40 MG tablet

## 2019-03-13 NOTE — Telephone Encounter (Signed)
Regino Schultze is currently working on this PA

## 2019-03-13 NOTE — Telephone Encounter (Signed)
Call to [place] for PA for Butalbital-Acetaminophen-Caffeine 50-325-40 mg tablets # 14.  Approved 9/30/202 thru 03/12/2020.  ID # [id number].  [facility] was called at [phone] and informed of.  Regino Schultze [place],RN 03/13/2019 11:40 AM.

## 2019-03-13 NOTE — Addendum Note (Signed)
Addended by: Hulan Fray on: 03/13/2019 12:48 PM   Modules accepted: Orders

## 2019-03-18 ENCOUNTER — Other Ambulatory Visit: Payer: Self-pay

## 2019-03-18 NOTE — Patient Outreach (Signed)
[facility]) Care Management  03/18/2019   [name] 06/12/1951 [id number]  Subjective: Successful outreach to the patient for assessment.  Two patient identifiers given.  The patient states that she has been doing fine.  She states that her blood sugars have been running in the low 200's. She did not check her blood sugar today.   Her a1c went from  7.9 to 9.5.  She states that she did not have her metformin due to some circumstances that she would not discuss. She states that she has her medications now and has been taking it.  She feels that her blood sugars have come down some.  She states that she is monitoring her diet for carbs and she drinks diet sodas and orange juice.  Advised the patient that it is ok to drink orange juice to bring her blood sugars up but not to drink all the time. Discussed with the patient how elevated blood sugars will affect her body and wound healing.  She verbalized understanding.   Informed the patient that I will be moving to a new position and one of my colleague will be calling her.  Current Medications:  Current Outpatient Medications  Medication Sig Dispense Refill  . albuterol (PROVENTIL HFA;VENTOLIN HFA) 108 (90 Base) MCG/ACT inhaler Inhale 1-2 puffs into the lungs every 6 (six) hours as needed for wheezing or shortness of breath. 1 Inhaler 5  . amLODipine (NORVASC) 10 MG tablet TAKE 1 TABLET BY MOUTH ONCE DAILY 90 tablet 1  . aspirin EC 81 MG tablet Take 81 mg by mouth daily.    . butalbital-acetaminophen-caffeine (FIORICET) 50-325-40 MG tablet Take 1-2 tablets by mouth every 6 (six) hours as needed for headache. 14 tablet 0  . cetirizine (ZYRTEC) 5 MG tablet Take 1 tablet (5 mg total) by mouth daily. As needed for hand itchiness. 30 tablet 0  . gabapentin (NEURONTIN) 600 MG tablet Take 1 tablet (600 mg total) by mouth 3 (three) times daily. 270 tablet 3  . metFORMIN (GLUMETZA) 1000 MG (MOD) 24 hr tablet Take 1 tablet (1,000  mg total) by mouth 2 (two) times daily with a meal. 180 tablet 1  . pantoprazole (PROTONIX) 40 MG tablet TAKE 1 TABLET BY MOUTH TWICE DAILY, BEFORE FIRST AND LAST MEAL OF THE DAY 60 tablet 0  . rosuvastatin (CRESTOR) 40 MG tablet TAKE 1 TABLET BY MOUTH EVERY DAY 90 tablet 0  . sertraline (ZOLOFT) 100 MG tablet TAKE 1 TABLET(100 MG) BY MOUTH DAILY 90 tablet 0  . sitaGLIPtin (JANUVIA) 50 MG tablet TAKE 1 TABLET(50 MG) BY MOUTH DAILY 90 tablet 0  . betamethasone dipropionate (DIPROLENE) 0.05 % cream Apply topically 2 (two) times daily. (Patient not taking: Reported on 03/18/2019) 30 g 0   No current facility-administered medications for this visit.     Functional Status:  In your present state of health, do you have any difficulty performing the following activities: 03/05/2019 02/14/2019  Hearing? N N  Vision? N Y  Comment Has new script for glasses.  Has not picked up can't afford to get glasses  Difficulty concentrating or making decisions? N N  Walking or climbing stairs? N Y  Comment - falls  Dressing or bathing? N N  Doing errands, shopping? Y [name] and eating ? - Y  Using the Toilet? - N  In the past six months, have you accidently leaked urine? - Y  Comment - sees a urologist  Do you have problems with loss of bowel control? - N  Managing your Medications? - N  Managing your Finances? - N  Housekeeping or managing your Housekeeping? - N  Some recent data might be hidden    Fall/Depression Screening: Fall Risk  03/18/2019 03/05/2019 02/14/2019  Falls in the past year? 0 1 1  Comment - - -  Number falls in past yr: - 1 1  Injury with Fall? - 1 0  Comment - - -  Risk Factor Category  - - -  Risk for fall due to : - Impaired balance/gait;History of fall(s) Impaired balance/gait  Risk for fall due to: Comment - - -  Follow up - Falls prevention discussed Falls evaluation completed;Education provided  Comment - - -   PHQ 2/9 Scores 02/14/2019 02/01/2019  11/13/2018 10/04/2018 07/20/2018 06/18/2018 03/02/2018  PHQ - 2 Score 2 0 0 5 - 1 3  PHQ- 9 Score 8 - 2 12 - 5 10  Exception Documentation - - - - Patient refusal - -    Assessment: Patient will continue to benefit from health coach outreach for disease management and support. [facility] CM Care Plan Problem One     Most Recent Value  [facility] Long Term Goal   In 90 days the patient will verbalize lowering her a1c 1-2 points  [facility] Long Term Goal Start Date  03/18/19  Interventions for Problem One Long Term Goal  Discussed blood sugar ranges, reviewed food and fluid intake, discussed the importance of taking mdeications as prescribed and notifiing me for help with medications, reviewed information about smiking cessation, encouraged the patient to go to all of her appointments     Plan: Altha will contact patient in the month of January and patient agrees to next outreach.   Julia Arms RN, BSN, [place] Direct Dial:  [phone]  Fax: [phone]

## 2019-03-27 ENCOUNTER — Encounter: Payer: Self-pay | Admitting: Internal Medicine

## 2019-03-27 ENCOUNTER — Ambulatory Visit (INDEPENDENT_AMBULATORY_CARE_PROVIDER_SITE_OTHER): Payer: Medicare Other | Admitting: Internal Medicine

## 2019-03-27 ENCOUNTER — Other Ambulatory Visit: Payer: Self-pay

## 2019-03-27 DIAGNOSIS — Z0000 Encounter for general adult medical examination without abnormal findings: Secondary | ICD-10-CM | POA: Diagnosis not present

## 2019-03-27 DIAGNOSIS — E2839 Other primary ovarian failure: Secondary | ICD-10-CM

## 2019-03-27 DIAGNOSIS — Z1231 Encounter for screening mammogram for malignant neoplasm of breast: Secondary | ICD-10-CM

## 2019-03-27 NOTE — Progress Notes (Signed)
This AWV is being conducted by [place] only. The patient was located at home and I was located in [facility]. The patient's identity was confirmed using their DOB and current address. The patient or his/her legal guardian has consented to being evaluated through a telephone encounter and understands the associated risks (an examination cannot be done and the patient may need to come in for an appointment) / benefits (allows the patient to remain at home, decreasing exposure to coronavirus). I personally spent 50 minutes conducting the AWV.  Subjective:   [name] is a 68 y.o. female who presents for a Medicare Annual Wellness Visit.  The following items have been reviewed and updated today in the appropriate area in the EMR.   Health Risk Assessment  Height, weight, BMI, and BP Visual acuity if needed Depression screen Fall risk / safety level Advance directive discussion Medical and family history were reviewed and updated Updating list of other providers & suppliers Medication reconciliation, including over the counter medicines Cognitive screen Written screening schedule Risk Factor list Personalized health advice, risky behaviors, and treatment advice  Social History   Social History Narrative   Moved to [facility] from [place], shortly after the death of her husband, to live with her daughter.  She notes significant life stress related to her 23 year-old grand-daughter with bipolar disorder.  She has previously worked as a Occupational psychologist.      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 There are 2 steps without handrails up to the entrance the  patient uses.       Patient's method of transportation is via family member.      The highest level of education was some college: 2 Careers adviser (pharmacy and psych).      The patient currently Retired/disabled.      Identified important Relationships are "My daughter and granddaughter."      Pets : "2 indoor cats, 1 indoor dog, 1 outdoor dog, and a lizard in a Lobbyist / Fun: "Have fun with my great grandson playing around the house, watching him play outside with his imagination."       Current Stressors: "My granddaughter's fiance and my granddaughter."       Religious / Personal Beliefs: "Protestant"       L. , BSN, RN-BC            Objective:    Vitals: There were no vitals taken for this visit. Vitals are unable to obtained due to [id number] public health emergency  Activities of Daily Living In your present state of health, do you have any difficulty performing the following activities: 03/27/2019 03/05/2019  Hearing? N N  Vision? Y N  Comment need new eyeglasses but can't afford them Has new script for glasses.  Has not picked up  Difficulty concentrating or making decisions? Y N  Comment Sometimes -  Walking or climbing stairs? Y N  Comment Stairs -  Dressing or bathing? N N  Doing errands, shopping? Y Y  Comment - -  Preparing Food and eating ? - -  Using the Toilet? - -  In the past six months, have you accidently leaked urine? - -  Comment - -  Do you have problems with loss of bowel control? - -  Managing your Medications? - -  Managing your Finances? - -  Housekeeping or managing your Housekeeping? - -  Some recent data might be hidden    Goals Goals    . Blood Pressure < 140/90    . Have 3 meals a day    . HEMOGLOBIN A1C < 7.0    . LDL CALC < 100    . Prevent Falls       Fall Risk Fall Risk  03/27/2019 03/18/2019 03/05/2019 02/14/2019 02/01/2019  Falls in the past year? 1 0 1 1 1   Comment - - - - -  Number falls in past yr:  1 - 1 1 1   Injury with Fall? 1 - 1 0 0  Comment "Bruises on arm" - - - -  Risk Factor Category  - - - - -  Risk for fall due to : History of fall(s);Impaired balance/gait;Impaired mobility - Impaired balance/gait;History of fall(s) Impaired balance/gait Impaired balance/gait;History of fall(s)  Risk for fall due to: Comment - - - - -  Follow up Education provided;Falls prevention discussed - Falls prevention discussed Falls evaluation completed;Education provided Falls prevention discussed  Comment - - - - bending topples over.  Climbing steps   [employer] on [employer] and Handout on Home Exercise Program, Access codes [place] and [id number] mailed to patient. Patient has her own exercise bands.   Depression Screen PHQ 2/9 Scores 03/27/2019 02/14/2019 02/01/2019 11/13/2018  PHQ - 2 Score 1 2 0 0  PHQ- 9 Score 8 8 - 2  Exception Documentation - - - -    Patient will consider meeting with [facility] for counseling   Cognitive Testing Six-Item Cognitive Screener   "I would like to ask you some questions that ask you to use your memory. I am going to name three objects. Please wait until I say all three words, then repeat them. Remember what they are  because I am going to ask you to name them again in a few minutes. Please repeat these words for me: APPLE-TABLE-PENNY." (Interviewer may repeat names 3 times if necessary but repetition not scored.)  Did patient correctly repeat all three words? Yes - may proceed with screen  What year is this? Correct What month is this? Correct What day of the week is this? Correct  What were the three objects I asked you to remember? . Apple Correct . Table Correct . Penny Correct  Score one point for each incorrect answer.  A score of 2 or more points warrants additional investigation.  Patient's score 0  Assessment and Plan:     Patient c/o 5 day hx of pea size swollen lymph node under left ear that is tender to the touch. Denies fever, ear pain,  sore throat. Will discuss with provider. PHQ-9 = 8. She will consider meeting with Miquel Dunn for counseling Patient refuses flu vaccine. Referral placed for Dexa scan and mammogram Offered to refer back to [facility] with help lowering A1c and for help with health food/drink choices. Patient not interested at this time.  During the course of the visit the patient was educated and counseled about appropriate screening and preventive services as documented in the assessment and plan.  The printed AVS was given to the patient and included an updated screening schedule, a  list of risk factors, and personalized health advice.        Velora Heckler, RN  03/27/2019

## 2019-03-27 NOTE — Progress Notes (Signed)
I discussed the AWV findings with the RN who conducted the visit. I was present in the office suite and immediately available to provide assistance and direction throughout the time the service was provided.  If her painful lymph node fails to improve after another week or she develops fever or other concerning symptoms, she should schedule a visit. Otherwise continue to monitor at home.

## 2019-03-27 NOTE — Patient Instructions (Addendum)
Annual Wellness Visit   Medicare Covered Preventative Screenings and Services  Services & Screenings Men and Women Who How Often Need? Date of Last Service Action  Abdominal Aortic Aneurysm Adults with AAA risk factors Once     Alcohol Misuse and Counseling All Adults Screening once a year if no alcohol misuse. Counseling up to 4 face to face sessions.     Bone Density Measurement  Adults at risk for osteoporosis Once every 2 yrs Yes  Referral placed to the Benjamin Perez Panel Z13.6 All adults without CV disease Once every 5 yrs     Colorectal Cancer   Stool sample or  Colonoscopy All adults 64 and older   Once every year  Every 10 years     Depression All Adults Once a year Yes Today Consider meeting with Miquel Dunn for counseling  Diabetes Screening Blood glucose, post glucose load, or GTT Z13.1  All adults at risk  Pre-diabetics  Once per year  Twice per year     Diabetes  Self-Management Training All adults Diabetics 10 hrs first year; 2 hours subsequent years. Requires Copay     Glaucoma  Diabetics  Family history of glaucoma  African Americans 56 yrs +  Hispanic Americans 49 yrs + Annually - requires coppay     Hepatitis C Z72.89 or F19.20  High Risk for HCV  Born between 1945 and 1965  Annually  Once     HIV Z11.4 All adults based on risk  Annually btw ages 42 & 54 regardless of risk  Annually > 65 yrs if at increased risk     Lung Cancer Screening Asymptomatic adults aged 39-77 with 78 pack yr history and current smoker OR quit within the last 15 yrs Annually Must have counseling and shared decision making documentation before first screen     Medical Nutrition Therapy Adults with   Diabetes  Renal disease  Kidney transplant within past 3 yrs 3 hours first year; 2 hours subsequent years Yes    Obesity and Counseling All adults Screening once a year Counseling if BMI 30 or higher  Today   Tobacco Use Counseling Adults who use tobacco  Up to 8  visits in one year     Vaccines Z23  Hepatitis B  Influenza   Pneumonia  Adults   Once  Once every flu season  Two different vaccines separated by one year Yes  Yearly flu vaccine starting September 1   Next Annual Wellness Visit People with Medicare Every year  Today     Services & Screenings Women Who How Often Need  Date of Last Service Action  Mammogram  Z12.31 Women over 5 One baseline ages 87-39. Annually ager 40 yrs+ Yes  [facility]  Pap tests All women Annually if high risk. Every 2 yrs for normal risk women     Screening for cervical cancer with   Pap (Z01.419 nl or Z01.411abnl) &  HPV Z11.51 Women aged 21 to 31 Once every 5 yrs     Screening pelvic and breast exams All women Annually if high risk. Every 2 yrs for normal risk women     Sexually Transmitted Diseases  Chlamydia  Gonorrhea  Syphilis All at risk adults Annually for non pregnant females at increased risk         [place] Men Who How Ofter Need  Date of Last Service Action  Prostate Cancer - DRE & PSA Men over 48  Annually.  DRE might require a copay.     Sexually Transmitted Diseases  Syphilis All at risk adults Annually for men at increased risk         Things That May Be Affecting Your Health:  Alcohol  Hearing loss  Pain   X Depression X Home Safety  Sexual Health   Diabetes  Lack of physical activity  Stress   Difficulty with daily activities  Loneliness  Tiredness   Drug use  Medicines  Tobacco use  X Falls  Motor Vehicle Safety  Weight   Food choices  Oral Health X Other- Proper footwear    YOUR PERSONALIZED HEALTH PLAN : 1. Schedule your next subsequent Medicare Wellness visit in one year 2. Attend all of your regular appointments to address your medical issues 3. Complete the preventative screenings and services 4. A referral has been placed to [facility] for Dexa scan and mammogram 5. Please consider meeting with Miquel Dunn for  counseling 6. Begin seated and standing exercises with exercise band. 7. Try diluting OJ or infusing water with fruit /veggie to decrease sugar intake 8. Call back if lymph node remains swollen after 1 week.    Bone Density Test The bone density test uses a special type of X-ray to measure the amount of calcium and other minerals in your bones. It can measure bone density in the hip and the spine. The test procedure is similar to having a regular X-ray. This test may also be called:  Bone densitometry.  Bone mineral density test.  Dual-energy X-ray absorptiometry (DEXA). You may have this test to:  Diagnose a condition that causes weak or thin bones (osteoporosis).  Screen you for osteoporosis.  Predict your risk for a broken bone (fracture).  Determine how well your osteoporosis treatment is working. Tell a health care provider about:  Any allergies you have.  All medicines you are taking, including vitamins, herbs, eye drops, creams, and over-the-counter medicines.  Any problems you or family members have had with anesthetic medicines.  Any blood disorders you have.  Any surgeries you have had.  Any medical conditions you have.  Whether you are pregnant or may be pregnant.  Any medical tests you have had within the past 14 days that used contrast material. What are the risks? Generally, this is a safe procedure. However, it does expose you to a small amount of radiation, which can slightly increase your cancer risk. What happens before the procedure?  Do not take any calcium supplements starting 24 hours before your test.  Remove all metal jewelry, eyeglasses, dental appliances, and any other metal objects. What happens during the procedure?   You will lie down on an exam table. There will be an X-ray generator below you and an imaging device above you.  Other devices, such as boxes or braces, may be used to position your body properly for the scan.  The  machine will slowly scan your body. You will need to keep still.  The images will show up on a screen in the room. Images will be examined by a specialist after your test is done. The procedure may vary among health care providers and hospitals. What happens after the procedure?  It is up to you to get your test results. Ask your health care provider, or the department that is doing the test, when your results will be ready. Summary  A bone density test is an imaging test that uses a type of X-ray to  measure the amount of calcium and other minerals in your bones.  The test may be used to diagnose or screen you for a condition that causes weak or thin bones (osteoporosis), predict your risk for a broken bone (fracture), or determine how well your osteoporosis treatment is working.  Do not take any calcium supplements starting 24 hours before your test.  Ask your health care provider, or the department that is doing the test, when your results will be ready. This information is not intended to replace advice given to you by your health care provider. Make sure you discuss any questions you have with your health care provider. Document Released: 06/21/2004 Document Revised: 06/15/2017 Document Reviewed: 04/03/2017 Elsevier Patient Education  2020 [place] Prevention in the Home, Adult Falls can cause injuries. They can happen to people of all ages. There are many things you can do to make your home safe and to help prevent falls. Ask for help when making these changes, if needed. What actions can I take to prevent falls? General Instructions  Use good lighting in all rooms. Replace any light bulbs that burn out.  Turn on the lights when you go into a dark area. Use night-lights.  Keep items that you use often in easy-to-reach places. Lower the shelves around your home if necessary.  Set up your furniture so you have a clear path. Avoid moving your furniture around.  Do not  have throw rugs and other things on the floor that can make you trip.  Avoid walking on wet floors.  If any of your floors are uneven, fix them.  Add color or contrast paint or tape to clearly mark and help you see: ? Any grab bars or handrails. ? First and last steps of stairways. ? Where the edge of each step is.  If you use a stepladder: ? Make sure that it is fully opened. Do not climb a closed stepladder. ? Make sure that both sides of the stepladder are locked into place. ? Ask someone to hold the stepladder for you while you use it.  If there are any pets around you, be aware of where they are. What can I do in the bathroom?      Keep the floor dry. Clean up any water that spills onto the floor as soon as it happens.  Remove soap buildup in the tub or shower regularly.  Use non-skid mats or decals on the floor of the tub or shower.  Attach bath mats securely with double-sided, non-slip rug tape.  If you need to sit down in the shower, use a plastic, non-slip stool.  Install grab bars by the toilet and in the tub and shower. Do not use towel bars as grab bars. What can I do in the bedroom?  Make sure that you have a light by your bed that is easy to reach.  Do not use any sheets or blankets that are too big for your bed. They should not hang down onto the floor.  Have a firm chair that has side arms. You can use this for support while you get dressed. What can I do in the kitchen?  Clean up any spills right away.  If you need to reach something above you, use a strong step stool that has a grab bar.  Keep electrical cords out of the way.  Do not use floor polish or wax that makes floors slippery. If you must use wax, use non-skid  floor wax. What can I do with my stairs?  Do not leave any items on the stairs.  Make sure that you have a light switch at the top of the stairs and the bottom of the stairs. If you do not have them, ask someone to add them for you.   Make sure that there are handrails on both sides of the stairs, and use them. Fix handrails that are broken or loose. Make sure that handrails are as long as the stairways.  Install non-slip stair treads on all stairs in your home.  Avoid having throw rugs at the top or bottom of the stairs. If you do have throw rugs, attach them to the floor with carpet tape.  Choose a carpet that does not hide the edge of the steps on the stairway.  Check any carpeting to make sure that it is firmly attached to the stairs. Fix any carpet that is loose or worn. What can I do on the outside of my home?  Use bright outdoor lighting.  Regularly fix the edges of walkways and driveways and fix any cracks.  Remove anything that might make you trip as you walk through a door, such as a raised step or threshold.  Trim any bushes or trees on the path to your home.  Regularly check to see if handrails are loose or broken. Make sure that both sides of any steps have handrails.  Install guardrails along the edges of any raised decks and porches.  Clear walking paths of anything that might make someone trip, such as tools or rocks.  Have any leaves, snow, or ice cleared regularly.  Use sand or salt on walking paths during winter.  Clean up any spills in your garage right away. This includes grease or oil spills. What other actions can I take?  Wear shoes that: ? Have a low heel. Do not wear high heels. ? Have rubber bottoms. ? Are comfortable and fit you well. ? Are closed at the toe. Do not wear open-toe sandals.  Use tools that help you move around (mobility aids) if they are needed. These include: ? Canes. ? Walkers. ? Scooters. ? Crutches.  Review your medicines with your doctor. Some medicines can make you feel dizzy. This can increase your chance of falling. Ask your doctor what other things you can do to help prevent falls. Where to find more information  Centers for Disease Control and  Prevention, STEAD[facility]: https://garcia.biz/  [employer] on Aging: BrainJudge.co.uk Contact a doctor if:  You are afraid of falling at home.  You feel weak, drowsy, or dizzy at home.  You fall at home. Summary  There are many simple things that you can do to make your home safe and to help prevent falls.  Ways to make your home safe include removing tripping hazards and installing grab bars in the bathroom.  Ask for help when making these changes in your home. This information is not intended to replace advice given to you by your health care provider. Make sure you discuss any questions you have with your health care provider. Document Released: 03/26/2009 Document Revised: 09/20/2018 Document Reviewed: 01/12/2017 Elsevier Patient Education  2020 [place] Maintenance, Female Adopting a healthy lifestyle and getting preventive care are important in promoting health and wellness. Ask your health care provider about:  The right schedule for you to have regular tests and exams.  Things you can do on your own to prevent diseases and  keep yourself healthy. What should I know about diet, weight, and exercise? Eat a healthy diet   Eat a diet that includes plenty of vegetables, fruits, low-fat dairy products, and lean protein.  Do not eat a lot of foods that are high in solid fats, added sugars, or sodium. Maintain a healthy weight Body mass index (BMI) is used to identify weight problems. It estimates body fat based on height and weight. Your health care provider can help determine your BMI and help you achieve or maintain a healthy weight. Get regular exercise Get regular exercise. This is one of the most important things you can do for your health. Most adults should:  Exercise for at least 150 minutes each week. The exercise should increase your heart rate and make you sweat (moderate-intensity exercise).  Do strengthening exercises at least twice a  week. This is in addition to the moderate-intensity exercise.  Spend less time sitting. Even light physical activity can be beneficial. Watch cholesterol and blood lipids Have your blood tested for lipids and cholesterol at 66 years of age, then have this test every 5 years. Have your cholesterol levels checked more often if:  Your lipid or cholesterol levels are high.  You are older than 66 years of age.  You are at high risk for heart disease. What should I know about cancer screening? Depending on your health history and family history, you may need to have cancer screening at various ages. This may include screening for:  Breast cancer.  Cervical cancer.  Colorectal cancer.  Skin cancer.  Lung cancer. What should I know about heart disease, diabetes, and high blood pressure? Blood pressure and heart disease  High blood pressure causes heart disease and increases the risk of stroke. This is more likely to develop in people who have high blood pressure readings, are of African descent, or are overweight.  Have your blood pressure checked: ? Every 3-5 years if you are 9-70 years of age. ? Every year if you are 50 years old or older. Diabetes Have regular diabetes screenings. This checks your fasting blood sugar level. Have the screening done:  Once every three years after age 58 if you are at a normal weight and have a low risk for diabetes.  More often and at a younger age if you are overweight or have a high risk for diabetes. What should I know about preventing infection? Hepatitis B If you have a higher risk for hepatitis B, you should be screened for this virus. Talk with your health care provider to find out if you are at risk for hepatitis B infection. Hepatitis C Testing is recommended for:  Everyone born from [birth year] through 1965.  Anyone with known risk factors for hepatitis C. Sexually transmitted infections (STIs)  Get screened for STIs, including gonorrhea  and chlamydia, if: ? You are sexually active and are younger than 66 years of age. ? You are older than 66 years of age and your health care provider tells you that you are at risk for this type of infection. ? Your sexual activity has changed since you were last screened, and you are at increased risk for chlamydia or gonorrhea. Ask your health care provider if you are at risk.  Ask your health care provider about whether you are at high risk for HIV. Your health care provider may recommend a prescription medicine to help prevent HIV infection. If you choose to take medicine to prevent HIV, you should first get tested  for HIV. You should then be tested every 3 months for as long as you are taking the medicine. Pregnancy  If you are about to stop having your period (premenopausal) and you may become pregnant, seek counseling before you get pregnant.  Take 400 to 800 micrograms (mcg) of folic acid every day if you become pregnant.  Ask for birth control (contraception) if you want to prevent pregnancy. Osteoporosis and menopause Osteoporosis is a disease in which the bones lose minerals and strength with aging. This can result in bone fractures. If you are 63 years old or older, or if you are at risk for osteoporosis and fractures, ask your health care provider if you should:  Be screened for bone loss.  Take a calcium or vitamin D supplement to lower your risk of fractures.  Be given hormone replacement therapy (HRT) to treat symptoms of menopause. Follow these instructions at home: Lifestyle  Do not use any products that contain nicotine or tobacco, such as cigarettes, e-cigarettes, and chewing tobacco. If you need help quitting, ask your health care provider.  Do not use street drugs.  Do not share needles.  Ask your health care provider for help if you need support or information about quitting drugs. Alcohol use  Do not drink alcohol if: ? Your health care provider tells you not  to drink. ? You are pregnant, may be pregnant, or are planning to become pregnant.  If you drink alcohol: ? Limit how much you use to 0-1 drink a day. ? Limit intake if you are breastfeeding.  Be aware of how much alcohol is in your drink. In the [place] one drink equals one 12 oz bottle of beer (355 mL), one 5 oz glass of wine (148 mL), or one 1 oz glass of hard liquor (44 mL). General instructions  Schedule regular health, dental, and eye exams.  Stay current with your vaccines.  Tell your health care provider if: ? You often feel depressed. ? You have ever been abused or do not feel safe at home. Summary  Adopting a healthy lifestyle and getting preventive care are important in promoting health and wellness.  Follow your health care provider's instructions about healthy diet, exercising, and getting tested or screened for diseases.  Follow your health care provider's instructions on monitoring your cholesterol and blood pressure. This information is not intended to replace advice given to you by your health care provider. Make sure you discuss any questions you have with your health care provider. Document Released: 12/13/2010 Document Revised: 05/23/2018 Document Reviewed: 05/23/2018 Elsevier Patient Education  2020 [employer].

## 2019-03-28 ENCOUNTER — Ambulatory Visit (INDEPENDENT_AMBULATORY_CARE_PROVIDER_SITE_OTHER): Payer: Medicare Other | Admitting: Radiation Oncology

## 2019-03-28 ENCOUNTER — Encounter: Payer: Self-pay | Admitting: Radiation Oncology

## 2019-03-28 ENCOUNTER — Telehealth: Payer: Self-pay | Admitting: Internal Medicine

## 2019-03-28 ENCOUNTER — Other Ambulatory Visit: Payer: Self-pay

## 2019-03-28 DIAGNOSIS — Z720 Tobacco use: Secondary | ICD-10-CM | POA: Diagnosis not present

## 2019-03-28 DIAGNOSIS — K029 Dental caries, unspecified: Secondary | ICD-10-CM | POA: Diagnosis not present

## 2019-03-28 DIAGNOSIS — R590 Localized enlarged lymph nodes: Secondary | ICD-10-CM | POA: Insufficient documentation

## 2019-03-28 DIAGNOSIS — K0889 Other specified disorders of teeth and supporting structures: Secondary | ICD-10-CM | POA: Diagnosis not present

## 2019-03-28 DIAGNOSIS — Z7982 Long term (current) use of aspirin: Secondary | ICD-10-CM

## 2019-03-28 MED ORDER — AMOXICILLIN-POT CLAVULANATE 875-125 MG PO TABS: 1.0000 | ORAL_TABLET | Freq: Two times a day (BID) | ORAL | 0 refills | Status: AC

## 2019-03-28 NOTE — Patient Instructions (Addendum)
Thank you for coming to your appointment. It was so nice to see you. Today we discussed  Diagnoses and all orders for this visit:  Toothache  Other orders -     amoxicillin-clavulanate (AUGMENTIN) 875-125 MG tablet; Take 1 tablet by mouth 2 (two) times daily for 5 days.    Please call us if your symptoms worsen or fail to improve.  Sincerely,  Al Decant, MD

## 2019-03-28 NOTE — Progress Notes (Signed)
Internal Medicine Clinic Attending  I saw and evaluated the patient.  I personally confirmed the key portions of the history and exam documented by Dr. Lanier and I reviewed pertinent patient test results.  The assessment, diagnosis, and plan were formulated together and I agree with the documentation in the resident's note.   

## 2019-03-28 NOTE — Assessment & Plan Note (Addendum)
-  pt w/ known history dental caries and previous odontogenic infections presenting with tooth pain -patient states she needs her lower teeth removed but states she cannot afford it -states she was able to eat and drink normally last night but this morning she reports 9/10 pain which is so severe she isn't able to eat or drink at all -she states she has not seen any pus or other drainage -she denies fevers or chills -she reports a swollen lymph node for the last 3-4 days -on exam there is evidence of progressive dental disease with multiple missing and cracked teeth; patient exquisitely tender to palpation in the region of the left lower canine without purulence -patient has an enlarged left level IIB node which is non-tender to palpation; likely not related to the toothache given the location  -prescribed 5 days of augmentin 875 BID

## 2019-03-28 NOTE — Progress Notes (Signed)
Internal Medicine Clinic Attending  Case discussed with Dr. Melvin soon after the resident saw the patient.  We reviewed the resident's history and exam and pertinent patient test results.  I agree with the assessment, diagnosis, and plan of care documented in the resident's note.   

## 2019-03-28 NOTE — Telephone Encounter (Signed)
Pt woke up with tooth pain, pls contact [phone]

## 2019-03-28 NOTE — Progress Notes (Signed)
   CC: toothache   HPI:  [name] is a 68 y.o. F here with complaint of a toothache. Please see the assessment and plan for full HPI.  Past Medical History:  Diagnosis Date  . Acute blood loss anemia 09/27/2018  . Allergy   . Anemia   . Arthritis    "left knee" (09/08/2015)  . Asthma   . Blood transfusion without reported diagnosis 10/2017   anemic  . Brachial plexus disorders   . CHF (congestive heart failure) ([place])   . Chronic pain    neck pain, headache, neuropathy  . COPD (chronic obstructive pulmonary disease) ([place])    SEES ONLY DR. PATEL -   . Depression    "when my husband passed in 2013"  . Facial trauma 05/2018  . Facial trauma, initial encounter 06/18/2018  . Fall 04/19/2018  . Gastric ulcer   . GERD (gastroesophageal reflux disease)   . History of kidney stones   . Hypertension   . Hypertriglyceridemia   . Migraine    "monthly" (09/08/2015)  . Neuromuscular disorder ([facility])    neuropathy  . Puncture wound of foot, right 05/17/2012   Tetanus shot 3 yrs ago at [facility] in [place], per pt report   . Stroke ([place])    [facility]    IN [place]   4 YRS AGO   . Type II diabetes mellitus ([place]) 2010   diagnosed around 2010, only ever on metformin   Review of Systems:    Review of Systems  Constitutional: Negative for chills and fever.  HENT: Negative for ear pain.        Toothache without pain in surrounding soft tissues; enlarged node of left neck  All other systems reviewed and are negative.  Physical Exam:  Vitals:   03/28/19 1310  BP: (!) 109/91  Pulse: 83  Temp: 97.9 F (36.6 C)  TempSrc: Oral  SpO2: 97%  Weight: 189 lb 11.2 oz (86 kg)  Height: 5\' 6"  (1.676 m)    Physical Exam  Constitutional: She is well-developed, well-nourished, and in no distress. No distress.  HENT:  Mouth/Throat: No oropharyngeal exudate.  progressive dental disease with multiple missing and cracked mandibular teeth, edentulous maxilla; patient exquisitely tender  to palpation in the region of the left lower canine without purulence  Lymphadenopathy:    She has cervical adenopathy (1 cm left level IIB node, not hard or obviously fixed).  Skin: She is not diaphoretic.  Nursing note and vitals reviewed.  Assessment & Plan:   See Encounters Tab for problem based charting.  Patient seen with Dr. Evette Doffing

## 2019-03-28 NOTE — Assessment & Plan Note (Addendum)
-  pt w/ known history dental caries and previous odontogenic infections presenting with tooth pain and reports of a swollen lymph node for the last 3-4 days -on exam there is evidence of progressive dental disease with multiple missing and crack teeth; patient exquisitely tender to palpation in the region of the left lower canine without purulence -patient has an enlarged left level IIB node which is non-tender to palpation; likely not related to the toothache given the location  -lymph node made be benign and inflammatory in nature but given her age and history of smoking, a head and neck malignancy is a possibility; this requires close follow up

## 2019-03-28 NOTE — Telephone Encounter (Signed)
Acc at 1315 10/15

## 2019-03-29 ENCOUNTER — Other Ambulatory Visit: Payer: Self-pay

## 2019-03-29 ENCOUNTER — Telehealth (INDEPENDENT_AMBULATORY_CARE_PROVIDER_SITE_OTHER): Payer: Medicare Other | Admitting: Internal Medicine

## 2019-03-29 ENCOUNTER — Encounter: Payer: Self-pay | Admitting: Internal Medicine

## 2019-03-29 VITALS — Ht 66.0 in | Wt 189.0 lb

## 2019-03-29 DIAGNOSIS — I10 Essential (primary) hypertension: Secondary | ICD-10-CM | POA: Diagnosis not present

## 2019-03-29 DIAGNOSIS — Z720 Tobacco use: Secondary | ICD-10-CM

## 2019-03-29 DIAGNOSIS — R55 Syncope and collapse: Secondary | ICD-10-CM | POA: Diagnosis not present

## 2019-03-29 NOTE — Progress Notes (Signed)
Electrophysiology TeleHealth Note  Due to national recommendations of social distancing due to [place] 19, an audio telehealth visit is felt to be most appropriate for this patient at this time.  Verbal consent was obtained by me for the telehealth visit today.  The patient does not have capability for a virtual visit.  A phone visit is therefore required today.   Date:  03/29/2019   ID:  [name], DOB August 01, 1950, MRN [id number]  Location: patient's home  Provider location:  [facility]  Evaluation Performed: Follow-up visit  PCP:  Lars Mage, MD   Electrophysiologist:  Dr Rayann Heman  Chief Complaint:  palpitations  History of Present Illness:    [name] is a 68 y.o. female who presents via telehealth conferencing today.  Since last being seen in our clinic, the patient reports doing very well.  Today, she denies symptoms of palpitations, chest pain, shortness of breath,  lower extremity edema, dizziness, presyncope, or syncope.  The patient is otherwise without complaint today.  The patient denies symptoms of fevers, chills, cough, or new SOB worrisome for COVID 19.  Past Medical History:  Diagnosis Date  . Acute blood loss anemia 09/27/2018  . Allergy   . Anemia   . Arthritis    "left knee" (09/08/2015)  . Asthma   . Blood transfusion without reported diagnosis 10/2017   anemic  . Brachial plexus disorders   . CHF (congestive heart failure) ([place])   . Chronic pain    neck pain, headache, neuropathy  . COPD (chronic obstructive pulmonary disease) ([place])    SEES ONLY DR. PATEL -   . Depression    "when my husband passed in 2013"  . Facial trauma 05/2018  . Facial trauma, initial encounter 06/18/2018  . Fall 04/19/2018  . Gastric ulcer   . GERD (gastroesophageal reflux disease)   . History of kidney stones   . Hypertension   . Hypertriglyceridemia   . Migraine    "monthly" (09/08/2015)  . Neuromuscular disorder (HCC)    neuropathy  . Puncture  wound of foot, right 05/17/2012   Tetanus shot 3 yrs ago at [facility] in [place], per pt report   . Stroke ([place])    TIAS    IN [place]   4 YRS AGO   . Type II diabetes mellitus ([place]) 2010   diagnosed around 2010, only ever on metformin    Past Surgical History:  Procedure Laterality Date  . AMPUTATION Right 05/01/2015   Procedure: Right Foot 1st Ray Amputation;  Surgeon: Newt Minion, MD;  Location: [place];  Service: Orthopedics;  Laterality: Right;  . ARTHRODESIS METATARSAL     RIGHT 5 TH   . BILATERAL SALPINGOOPHORECTOMY Bilateral 1986   "after hemtoma evacuations; had to cut me open"  . BIOPSY  11/07/2017   Procedure: BIOPSY;  Surgeon: Irene Shipper, MD;  Location: [facility] ENDOSCOPY;  Service: Endoscopy;;  . CARPAL TUNNEL RELEASE Bilateral   . COLONOSCOPY     10 + yrs ago- pt unsure- was in [place]- pt states  MD will not send records but colon was normal per pt.   . CYSTOSCOPY W/ URETERAL STENT PLACEMENT Left 09/06/2018   Procedure: CYSTOSCOPY WITH LEFT RETROGRADE PYELOGRAM/URETERAL LEFT DOUBLE J STENT PLACEMENT;  Surgeon: Franchot Gallo, MD;  Location: [place];  Service: Urology;  Laterality: Left;  . DILATION AND CURETTAGE OF UTERUS  ~ 1982   S/P miscarriage  . ESOPHAGOGASTRODUODENOSCOPY (EGD) WITH PROPOFOL N/A 11/07/2017  Procedure: ESOPHAGOGASTRODUODENOSCOPY (EGD) WITH PROPOFOL;  Surgeon: Irene Shipper, MD;  Location: [facility] ENDOSCOPY;  Service: Endoscopy;  Laterality: N/A;  . EXTRACORPOREAL SHOCK WAVE LITHOTRIPSY Left 09/24/2018   Procedure: EXTRACORPOREAL SHOCK WAVE LITHOTRIPSY (ESWL);  Surgeon: Cleon Gustin, MD;  Location: [facility];  Service: Urology;  Laterality: Left;  . HEMATOMA EVACUATION Right 1986 x 2   "OVARY; w/in 1 wk after hysterectomy"  . KNEE ARTHROSCOPY     LEFT  . LAPAROSCOPIC CHOLECYSTECTOMY    . SHOULDER ARTHROSCOPY W/ ROTATOR CUFF REPAIR Bilateral   . TONSILLECTOMY    . TOTAL KNEE ARTHROPLASTY Left 09/30/2016   Procedure: LEFT TOTAL KNEE  ARTHROPLASTY;  Surgeon: Netta Cedars, MD;  Location: [place];  Service: Orthopedics;  Laterality: Left;  . TUBAL LIGATION    . VAGINAL HYSTERECTOMY  1986    Current Outpatient Medications  Medication Sig Dispense Refill  . albuterol (PROVENTIL HFA;VENTOLIN HFA) 108 (90 Base) MCG/ACT inhaler Inhale 1-2 puffs into the lungs every 6 (six) hours as needed for wheezing or shortness of breath. 1 Inhaler 5  . amLODipine (NORVASC) 10 MG tablet TAKE 1 TABLET BY MOUTH ONCE DAILY 90 tablet 1  . amoxicillin-clavulanate (AUGMENTIN) 875-125 MG tablet Take 1 tablet by mouth 2 (two) times daily for 5 days. 14 tablet 0  . aspirin EC 81 MG tablet Take 81 mg by mouth daily.    . betamethasone dipropionate (DIPROLENE) 0.05 % cream Apply topically 2 (two) times daily. 30 g 0  . butalbital-acetaminophen-caffeine (FIORICET) 50-325-40 MG tablet Take 1-2 tablets by mouth every 6 (six) hours as needed for headache. 14 tablet 0  . cetirizine (ZYRTEC) 5 MG tablet Take 1 tablet (5 mg total) by mouth daily. As needed for hand itchiness. 30 tablet 0  . gabapentin (NEURONTIN) 600 MG tablet Take 1 tablet (600 mg total) by mouth 3 (three) times daily. 270 tablet 3  . metFORMIN (GLUMETZA) 1000 MG (MOD) 24 hr tablet Take 1 tablet (1,000 mg total) by mouth 2 (two) times daily with a meal. 180 tablet 1  . pantoprazole (PROTONIX) 40 MG tablet TAKE 1 TABLET BY MOUTH TWICE DAILY, BEFORE FIRST AND LAST MEAL OF THE DAY 60 tablet 0  . rosuvastatin (CRESTOR) 40 MG tablet TAKE 1 TABLET BY MOUTH EVERY DAY 90 tablet 0  . sertraline (ZOLOFT) 100 MG tablet TAKE 1 TABLET(100 MG) BY MOUTH DAILY 90 tablet 0  . sitaGLIPtin (JANUVIA) 50 MG tablet TAKE 1 TABLET(50 MG) BY MOUTH DAILY 90 tablet 0   No current facility-administered medications for this visit.     Allergies:   Anesthesia s-i-60, Flexeril [cyclobenzaprine], and Soma [carisoprodol]   Social History:  The patient  reports that she has been smoking. She has a 14.70 pack-year smoking  history. She has never used smokeless tobacco. She reports that she does not drink alcohol or use drugs.   Family History:  The patient's family history includes Arthritis in her daughter and sister; Cancer in her paternal grandfather and paternal grandmother; Chiari malformation in her daughter; Heart attack in her mother and paternal grandfather; Heart attack (age of onset: 28) in her father; Hyperlipidemia in her mother; Hypertension in her father; Migraines in her daughter; Obesity in her daughter; Yves Dill Parkinson White syndrome in her daughter.   ROS:  Please see the history of present illness.   All other systems are personally reviewed and negative.    Exam:    Vital Signs:  Ht 5\' 6"  (1.676 m)   Wt 189 lb (  85.7 kg)   BMI 30.51 kg/m   Well sounding, alert and conversant   Labs/Other Tests and Data Reviewed:    Recent Labs: 09/05/2018: B Natriuretic Peptide 176.1 09/30/2018: Magnesium 1.9 10/15/2018: BUN 14; Creatinine, Ser 1.12; Potassium 3.8; Sodium 142 11/13/2018: Hemoglobin 11.4; Platelets 177 02/01/2019: ALT 19   Wt Readings from Last 3 Encounters:  03/29/19 189 lb (85.7 kg)  03/28/19 189 lb 11.2 oz (86 kg)  03/05/19 188 lb 3.2 oz (85.4 kg)   Echo 01/16/2018 revieweed   ASSESSMENT & PLAN:    1.  Syncope/ falls Unclear etiology Possibly due to orthostasis She wore an event monitor 01/2018 which did not reveal arrhythmias to explain her falls.  She had multiple symptomatic transmissions for "fell" and "fainted" which all corresponded to sinus rhythm I have offered implantation of an implantable loop recorder to further evaluate her syncopal events.  Risks of this procedure were discussed with the patient who wishes to proceed.  2. HTN Stable No change required today  3. Tobacco Cessation is advised  Follow-up:  Follow-up with me in the office on my next DOD day for ILR implantation.   Patient Risk:  after full review of this patients clinical status, I feel that they  are at moderate risk at this time.  Today, I have spent 15 minutes with the patient with telehealth technology discussing arrhythmia management .    SignedThompson Grayer, MD  03/29/2019 2:16 PM     [place] [address] [phone] (office) [phone] (fax)

## 2019-04-02 ENCOUNTER — Ambulatory Visit: Payer: Medicare Other | Admitting: Podiatry

## 2019-04-04 ENCOUNTER — Other Ambulatory Visit: Payer: Self-pay | Admitting: Internal Medicine

## 2019-04-04 ENCOUNTER — Encounter: Payer: Self-pay | Admitting: Internal Medicine

## 2019-04-04 DIAGNOSIS — Z20822 Contact with and (suspected) exposure to covid-19: Secondary | ICD-10-CM

## 2019-04-04 DIAGNOSIS — Z20828 Contact with and (suspected) exposure to other viral communicable diseases: Secondary | ICD-10-CM

## 2019-04-04 NOTE — Progress Notes (Signed)
[name] was exposed to a covid positive patient at court April 26, 2023 who subsequently passed away. She was advised to obtain covid testing after this exposure. She denies cough, SOB, fever, abdominal pain or nausea, anosmia, dysgeusia, or chest pain. She does have congestion but this is present at baseline and unchanged from usual. I will place an order for covid testing and she will follow-up in the morning at [facility] for testing. Also discussed calling the clinic if she develops any of the above symptoms regardless of whether her test is positive or negative.   Marty Heck, DO 04/04/2019, 5:34 PM Pager: [phone]

## 2019-04-04 NOTE — Progress Notes (Unsigned)
[name] was exposed to a covid positive patient at court 04-26-23 who subsequently passed away. She was advised to obtain covid testing after this exposure. She denies cough, SOB, fever, abdominal pain or nausea, anosmia, dysgeusia, or chest pain. She does have congestion but this is present at baseline and unchanged from usual. I will place an order for covid testing and she will follow-up in the morning at [facility] for testing. Also discussed calling the clinic if she develops any of the above symptoms regardless of whether her test is positive or negative.   Marty Heck, DO 04/04/2019, 5:34 PM Pager: [phone]

## 2019-04-05 ENCOUNTER — Other Ambulatory Visit: Payer: Self-pay

## 2019-04-05 DIAGNOSIS — Z20822 Contact with and (suspected) exposure to covid-19: Secondary | ICD-10-CM

## 2019-04-06 LAB — NOVEL CORONAVIRUS, NAA: SARS-CoV-2, NAA: NOT DETECTED

## 2019-04-08 ENCOUNTER — Other Ambulatory Visit: Payer: Self-pay

## 2019-04-08 DIAGNOSIS — G44311 Acute post-traumatic headache, intractable: Secondary | ICD-10-CM

## 2019-04-08 MED ORDER — BUTALBITAL-APAP-CAFFEINE 50-325-40 MG PO TABS: 1.0000 | ORAL_TABLET | Freq: Four times a day (QID) | ORAL | 0 refills | Status: AC | PRN

## 2019-04-08 NOTE — Telephone Encounter (Signed)
Requesting to speak with a nurse about meds and COVID test result.

## 2019-04-08 NOTE — Telephone Encounter (Signed)
Not detected AKA negative on 10/23. Are you able to give her results?

## 2019-04-08 NOTE — Telephone Encounter (Signed)
Called pt - informed of Butalbital refill and informed "tapering down the quantity as this medication is not meant to be a chronic long-term medicine.  She needs to schedule appointment with her PCP " per Dr Lynnae January. Stated she will call back; she has an appt already 12/22 with her PCP. Also informed her Covid 19 test is negative per Dr Lynnae January.

## 2019-04-08 NOTE — Telephone Encounter (Signed)
Called pt - stated she needs a refill on Butalbital; not on current med list.; for c/o h/a's. Also requesting Covid 19 results. Thanks

## 2019-04-08 NOTE — Telephone Encounter (Signed)
I refilled the medicine but I am tapering down the quantity as this medication is not meant to be a chronic long-term medicine.  She needs to schedule appointment with her PCP in the next 30 days to talk about preventative migraine medications as her Fioricet will continue to be tapered down in quantity every month.

## 2019-04-08 NOTE — Telephone Encounter (Signed)
Pt is also requesting Covid 19 results? Thanks

## 2019-04-09 ENCOUNTER — Telehealth: Payer: Self-pay | Admitting: Internal Medicine

## 2019-04-09 DIAGNOSIS — K254 Chronic or unspecified gastric ulcer with hemorrhage: Secondary | ICD-10-CM

## 2019-04-09 MED ORDER — PANTOPRAZOLE SODIUM 40 MG PO TBEC: DELAYED_RELEASE_TABLET | ORAL | 0 refills | Status: DC

## 2019-04-09 NOTE — Telephone Encounter (Signed)
Rx sent until visit. 

## 2019-04-09 NOTE — Telephone Encounter (Signed)
Pt is scheduled for OV 05/24/19 and requested a refill for pantoprazole.

## 2019-04-12 ENCOUNTER — Ambulatory Visit
Admission: RE | Admit: 2019-04-12 | Discharge: 2019-04-12 | Disposition: A | Discharge status: Home / Self Care | Payer: Medicare Other | Source: Ambulatory Visit | Attending: Student in an Organized Health Care Education/Training Program | Admitting: Student in an Organized Health Care Education/Training Program

## 2019-04-12 ENCOUNTER — Other Ambulatory Visit: Payer: Self-pay

## 2019-04-12 DIAGNOSIS — Z13820 Encounter for screening for osteoporosis: Secondary | ICD-10-CM | POA: Diagnosis not present

## 2019-04-12 DIAGNOSIS — Z780 Asymptomatic menopausal state: Secondary | ICD-10-CM | POA: Diagnosis not present

## 2019-04-12 DIAGNOSIS — Z1231 Encounter for screening mammogram for malignant neoplasm of breast: Secondary | ICD-10-CM | POA: Diagnosis not present

## 2019-04-12 DIAGNOSIS — E2839 Other primary ovarian failure: Secondary | ICD-10-CM

## 2019-04-17 ENCOUNTER — Encounter: Payer: Self-pay | Admitting: Podiatry

## 2019-04-17 ENCOUNTER — Other Ambulatory Visit: Payer: Self-pay

## 2019-04-17 ENCOUNTER — Ambulatory Visit (INDEPENDENT_AMBULATORY_CARE_PROVIDER_SITE_OTHER): Payer: Medicare Other | Admitting: Podiatry

## 2019-04-17 DIAGNOSIS — E1142 Type 2 diabetes mellitus with diabetic polyneuropathy: Secondary | ICD-10-CM | POA: Diagnosis not present

## 2019-04-17 DIAGNOSIS — S98111A Complete traumatic amputation of right great toe, initial encounter: Secondary | ICD-10-CM | POA: Diagnosis not present

## 2019-04-17 DIAGNOSIS — B351 Tinea unguium: Secondary | ICD-10-CM | POA: Diagnosis not present

## 2019-04-17 DIAGNOSIS — L84 Corns and callosities: Secondary | ICD-10-CM | POA: Diagnosis not present

## 2019-04-17 NOTE — Patient Instructions (Signed)

## 2019-04-21 NOTE — Progress Notes (Signed)
Subjective: [name] presents today with history of neuropathy. Patient seen for follow up of chronic, preulcerative lesion submet 5 right foot. She came in office today in soft socks and states this is the only thing she can wear due to foot pain. She denies any redness, drainage or swelling.  Lars Mage, MD is her PCP.   Current Outpatient Medications on File Prior to Visit  Medication Sig Dispense Refill  . albuterol (PROVENTIL HFA;VENTOLIN HFA) 108 (90 Base) MCG/ACT inhaler Inhale 1-2 puffs into the lungs every 6 (six) hours as needed for wheezing or shortness of breath. 1 Inhaler 5  . amLODipine (NORVASC) 10 MG tablet TAKE 1 TABLET BY MOUTH ONCE DAILY 90 tablet 1  . aspirin EC 81 MG tablet Take 81 mg by mouth daily.    . betamethasone dipropionate (DIPROLENE) 0.05 % cream Apply topically 2 (two) times daily. 30 g 0  . butalbital-acetaminophen-caffeine (FIORICET) 50-325-40 MG tablet Take 1-2 tablets by mouth every 6 (six) hours as needed for headache. 12 tablet 0  . cetirizine (ZYRTEC) 5 MG tablet Take 1 tablet (5 mg total) by mouth daily. As needed for hand itchiness. 30 tablet 0  . gabapentin (NEURONTIN) 600 MG tablet Take 1 tablet (600 mg total) by mouth 3 (three) times daily. 270 tablet 3  . metFORMIN (GLUMETZA) 1000 MG (MOD) 24 hr tablet Take 1 tablet (1,000 mg total) by mouth 2 (two) times daily with a meal. 180 tablet 1  . oxybutynin (DITROPAN) 5 MG tablet     . oxyCODONE-acetaminophen (PERCOCET/ROXICET) 5-325 MG tablet     . pantoprazole (PROTONIX) 40 MG tablet TAKE 1 TABLET BY MOUTH TWICE DAILY, BEFORE FIRST AND LAST MEAL OF THE DAY 60 tablet 0  . rosuvastatin (CRESTOR) 40 MG tablet TAKE 1 TABLET BY MOUTH EVERY DAY 90 tablet 0  . sertraline (ZOLOFT) 100 MG tablet TAKE 1 TABLET(100 MG) BY MOUTH DAILY 90 tablet 0  . sitaGLIPtin (JANUVIA) 50 MG tablet TAKE 1 TABLET(50 MG) BY MOUTH DAILY 90 tablet 0   No current facility-administered medications on file prior to visit.      Allergies  Allergen Reactions  . Flexeril [Cyclobenzaprine] Other (See Comments)    Prolonged QTc to 571, tachycardia  . Propofol Other (See Comments)    Hallucinations.  Manuela Neptune [Carisoprodol] Itching and Rash    Objective: There were no vitals filed for this visit.  Vascular Examination: Capillary refill time <3 seconds b/l.    Dorsalis pedis pulses present b/l.  Posterior tibial pulses present b/l.  No digital hair x 10 digits.  Skin temperature WNL b/l.  Dermatological Examination: Skin thin, shiny and atrophic b/l.  Toenails left hallux, 1-5 left and 3-5 right discolored, thick, dystrophic with subungual debris and pain with palpation to nailbeds due to thickness of nails.  Hyperkeratotic lesion submet head 5 right foot with subdermal hemorrhage. Partial thickness.  Measures 2.5 x 2.5 cm, predebridement. No erythema, no edema, no drainage, no flocculence noted.   Preulcerative corn right 2nd digit nailbed with subdermal hemorrhage.   Musculoskeletal: Muscle strength 5/5 to all LE muscle groups.  S/p amputation right foot  Neurological: Sensation diminished with 10 gram monofilament.  Assessment: 1. Painful onychomycosis toenails left hallux, 3-5 left and 2-5 right 2. Preulcerative callus submet head 5 right foot 3. Preulcerative corn right 2nd digit 4. S/p amputation right foot 5. NIDDM with neuropathy   Plan: 1. Toenails  left hallux, 3-5 left and 2-5 right were debrided in length and  girth without iatrogenic bleeding. 2. Preulcerative callus pared submetatarsal head(s) 5 right foot and 2nd digit right foot utilizing sterile scalpel blade without incident.  3. Encouraged her to wear her diabetic shoes. 4. Patient to report any pedal injuries to medical professional immediately. 5. Follow up 6 weeks. 6. Patient/POA to call should there be a concern in the interim.

## 2019-04-24 ENCOUNTER — Other Ambulatory Visit: Payer: Self-pay | Admitting: *Deleted

## 2019-04-24 MED ORDER — ROSUVASTATIN CALCIUM 40 MG PO TABS: 40.0000 mg | ORAL_TABLET | Freq: Every day | ORAL | 0 refills | Status: DC

## 2019-05-21 ENCOUNTER — Other Ambulatory Visit: Payer: Self-pay | Admitting: Internal Medicine

## 2019-05-21 ENCOUNTER — Encounter: Payer: Self-pay | Admitting: *Deleted

## 2019-05-21 DIAGNOSIS — F322 Major depressive disorder, single episode, severe without psychotic features: Secondary | ICD-10-CM

## 2019-05-21 DIAGNOSIS — E118 Type 2 diabetes mellitus with unspecified complications: Secondary | ICD-10-CM

## 2019-05-23 ENCOUNTER — Encounter: Payer: Self-pay | Admitting: Internal Medicine

## 2019-05-23 ENCOUNTER — Ambulatory Visit (INDEPENDENT_AMBULATORY_CARE_PROVIDER_SITE_OTHER): Payer: Medicare Other | Admitting: Internal Medicine

## 2019-05-23 ENCOUNTER — Other Ambulatory Visit: Payer: Self-pay

## 2019-05-23 ENCOUNTER — Encounter (INDEPENDENT_AMBULATORY_CARE_PROVIDER_SITE_OTHER): Payer: Self-pay

## 2019-05-23 ENCOUNTER — Telehealth: Payer: Self-pay

## 2019-05-23 VITALS — BP 140/78 | HR 93 | Ht 66.0 in | Wt 187.0 lb

## 2019-05-23 DIAGNOSIS — Z720 Tobacco use: Secondary | ICD-10-CM | POA: Diagnosis not present

## 2019-05-23 DIAGNOSIS — I10 Essential (primary) hypertension: Secondary | ICD-10-CM | POA: Diagnosis not present

## 2019-05-23 DIAGNOSIS — R55 Syncope and collapse: Secondary | ICD-10-CM

## 2019-05-23 HISTORY — PX: OTHER SURGICAL HISTORY: SHX169

## 2019-05-23 NOTE — Progress Notes (Signed)
PCP: Lars Mage, MD   Primary EP: Dr Rayann Heman  [name] is a 68 y.o. female who presents today for routine electrophysiology followup.  Since last being seen in our clinic, the patient reports doing very well.  Today, she denies symptoms of palpitations, chest pain, shortness of breath,  lower extremity edema, dizziness, presyncope, or syncope.  The patient is otherwise without complaint today.   Past Medical History:  Diagnosis Date  . Acute blood loss anemia 09/27/2018  . Allergy   . Anemia   . Arthritis    "left knee" (09/08/2015)  . Asthma   . Blood transfusion without reported diagnosis 10/2017   anemic  . Brachial plexus disorders   . CHF (congestive heart failure) ([place])   . Chronic pain    neck pain, headache, neuropathy  . COPD (chronic obstructive pulmonary disease) ([place])    SEES ONLY DR. PATEL -   . Depression    "when my husband passed in 2013"  . Facial trauma 05/2018  . Facial trauma, initial encounter 06/18/2018  . Fall 04/19/2018  . Gastric ulcer   . GERD (gastroesophageal reflux disease)   . Hemorrhagic gastritis   . History of kidney stones   . Hypertension   . Hypertriglyceridemia   . IDA (iron deficiency anemia)   . Internal hemorrhoids   . Intracranial aneurysm   . Migraine    "monthly" (09/08/2015)  . Neuromuscular disorder (HCC)    neuropathy  . Puncture wound of foot, right 05/17/2012   Tetanus shot 3 yrs ago at [facility] in [place], per pt report   . Stroke ([place])    TIAS    IN [place]   4 YRS AGO   . Type II diabetes mellitus ([place]) 2010   diagnosed around 2010, only ever on metformin   Past Surgical History:  Procedure Laterality Date  . AMPUTATION Right 05/01/2015   Procedure: Right Foot 1st Ray Amputation;  Surgeon: Newt Minion, MD;  Location: [place];  Service: Orthopedics;  Laterality: Right;  . ARTHRODESIS METATARSAL     RIGHT 5 TH   . BILATERAL SALPINGOOPHORECTOMY Bilateral 1986   "after hemtoma evacuations; had to  cut me open"  . BIOPSY  11/07/2017   Procedure: BIOPSY;  Surgeon: Irene Shipper, MD;  Location: [facility]COPY;  Service: Endoscopy;;  . CARPAL TUNNEL RELEASE Bilateral   . COLONOSCOPY     10 + yrs ago- pt unsure- was in [place]- pt states  MD will not send records but colon was normal per pt.   . CYSTOSCOPY W/ URETERAL STENT PLACEMENT Left 09/06/2018   Procedure: CYSTOSCOPY WITH LEFT RETROGRADE PYELOGRAM/URETERAL LEFT DOUBLE J STENT PLACEMENT;  Surgeon: Franchot Gallo, MD;  Location: [place];  Service: Urology;  Laterality: Left;  . DILATION AND CURETTAGE OF UTERUS  ~ 1982   S/P miscarriage  . ESOPHAGOGASTRODUODENOSCOPY (EGD) WITH PROPOFOL N/A 11/07/2017   Procedure: ESOPHAGOGASTRODUODENOSCOPY (EGD) WITH PROPOFOL;  Surgeon: Irene Shipper, MD;  Location: [facility] ENDOSCOPY;  Service: Endoscopy;  Laterality: N/A;  . EXTRACORPOREAL SHOCK WAVE LITHOTRIPSY Left 09/24/2018   Procedure: EXTRACORPOREAL SHOCK WAVE LITHOTRIPSY (ESWL);  Surgeon: Cleon Gustin, MD;  [facility];  Service: Urology;  Laterality: Left;  . HEMATOMA EVACUATION Right 1986 x 2   "OVARY; w/in 1 wk after hysterectomy"  . KNEE ARTHROSCOPY     LEFT  . LAPAROSCOPIC CHOLECYSTECTOMY    . SHOULDER ARTHROSCOPY W/ ROTATOR CUFF REPAIR Bilateral   . TONSILLECTOMY    .  TOTAL KNEE ARTHROPLASTY Left 09/30/2016   Procedure: LEFT TOTAL KNEE ARTHROPLASTY;  Surgeon: Netta Cedars, MD;  Location: [place];  Service: Orthopedics;  Laterality: Left;  . TUBAL LIGATION    . VAGINAL HYSTERECTOMY  1986    ROS- all systems are reviewed and negatives except as per HPI above  Current Outpatient Medications  Medication Sig Dispense Refill  . albuterol (PROVENTIL HFA;VENTOLIN HFA) 108 (90 Base) MCG/ACT inhaler Inhale 1-2 puffs into the lungs every 6 (six) hours as needed for wheezing or shortness of breath. 1 Inhaler 5  . amLODipine (NORVASC) 10 MG tablet TAKE 1 TABLET BY MOUTH ONCE DAILY 90 tablet 1  . aspirin EC 81 MG tablet Take 81 mg by mouth  daily.    . betamethasone dipropionate (DIPROLENE) 0.05 % cream Apply topically 2 (two) times daily. 30 g 0  . butalbital-acetaminophen-caffeine (FIORICET) 50-325-40 MG tablet Take 1-2 tablets by mouth every 6 (six) hours as needed for pain.    . cetirizine (ZYRTEC) 5 MG tablet Take 1 tablet (5 mg total) by mouth daily. As needed for hand itchiness. 30 tablet 0  . gabapentin (NEURONTIN) 600 MG tablet Take 1 tablet (600 mg total) by mouth 3 (three) times daily. 270 tablet 3  . metFORMIN (GLUMETZA) 1000 MG (MOD) 24 hr tablet Take 1 tablet (1,000 mg total) by mouth 2 (two) times daily with a meal. 180 tablet 1  . oxybutynin (DITROPAN) 5 MG tablet     . oxyCODONE-acetaminophen (PERCOCET/ROXICET) 5-325 MG tablet     . pantoprazole (PROTONIX) 40 MG tablet TAKE 1 TABLET BY MOUTH TWICE DAILY, BEFORE FIRST AND LAST MEAL OF THE DAY 60 tablet 0  . rosuvastatin (CRESTOR) 40 MG tablet Take 1 tablet (40 mg total) by mouth daily. 90 tablet 0  . sertraline (ZOLOFT) 100 MG tablet TAKE 1 TABLET(100 MG) BY MOUTH DAILY 90 tablet 0  . sitaGLIPtin (JANUVIA) 50 MG tablet TAKE 1 TABLET(50 MG) BY MOUTH DAILY 90 tablet 0   No current facility-administered medications for this visit.    Physical Exam: Vitals:   05/23/19 1101  BP: 140/78  Pulse: 93  SpO2: 96%  Weight: 187 lb (84.8 kg)  Height: 5\' 6"  (1.676 m)    GEN- The patient is well appearing, alert and oriented x 3 today.   Head- normocephalic, atraumatic Eyes-  Sclera clear, conjunctiva pink Ears- hearing intact Oropharynx- clear Lungs- Clear to ausculation bilaterally, normal work of breathing Heart- Regular rate and rhythm, no murmurs, rubs or gallops, PMI not laterally displaced GI- soft, NT, ND, + BS Extremities- no clubbing, cyanosis, or edema  Wt Readings from Last 3 Encounters:  05/23/19 187 lb (84.8 kg)  03/29/19 189 lb (85.7 kg)  03/28/19 189 lb 11.2 oz (86 kg)    Assessment and Plan:  1. Syncope and falls Unclear etiology I worry  about arrhythmias as the cause. She has worn short term monitors which were not revealing.  I would therefore advise implantation of an implantable loop recorder for long term arrhythmia monitoring.  Risks and benefits to ILR were discussed at length with the patient today, including but not limited to risks of bleeding and infection.  Extensive device education was performed.  Remote monitoring was also discussed at length today.  The patient understands and wishes to proceed.  We will proceed at this time with ILR implantation.  2. HTN Stable No change required today  3. Tobacco Cessation advised   Thompson Grayer MD, [facility] 05/23/2019 11:30 AM  DESCRIPTION OF PROCEDURE:  Informed written consent was obtained.  The patient required no sedation for the procedure today.  The patients left chest was prepped and draped. Mapping over the patient's chest was performed to identify the appropriate ILR site.  This area was found to be the left parasternal region over the 3rd-4th intercostal space.  The skin overlying this region was infiltrated with lidocaine for local analgesia.  A 0.5-cm incision was made at the implant site.  A subcutaneous ILR pocket was fashioned using a combination of sharp and blunt dissection.  A Medtronic Reveal Linq model LNQ 22 implantable loop recorder was then placed into the pocket R waves were very prominent and measured > 0.2 mV. EBL<1 ml.  Steri- Strips and a sterile dressing were then applied.  There were no early apparent complications.     CONCLUSIONS:   1. Successful implantation of a Medtronic Reveal LINQ implantable loop recorder for recurrent unexplained syncope  2. No early apparent complications.   Thompson Grayer MD, [facility] 05/23/2019 11:30 AM

## 2019-05-23 NOTE — Patient Instructions (Addendum)
Medication Instructions:  Your physician recommends that you continue on your current medications as directed. Please refer to the Current Medication list given to you today.  Labwork: None ordered.  Testing/Procedures: None ordered.  Follow-Up:  You will have a virtual visit with device clinic for a wound check.  You will get a call today to get this scheduled.  Your physician wants you to follow-up in: as needed with Dr. Rayann Heman.   Any Other Special Instructions Will Be Listed Below (If Applicable).  If you need a refill on your cardiac medications before your next appointment, please call your pharmacy.    Implantable Loop Recorder Placement, Care After This sheet gives you information about how to care for yourself after your procedure. Your health care provider may also give you more specific instructions. If you have problems or questions, contact your health care provider. What can I expect after the procedure? After the procedure, it is common to have:  Soreness or discomfort near the incision.  Some swelling or bruising near the incision. Follow these instructions at home: Incision care   Follow instructions from your health care provider about how to take care of your incision. Make sure you: ? Leave your outer dressing on for 48 hours.  After 48 hours you can remove that and shower. ? Leave adhesive strips in place. These skin closures may need to stay in place for 2 weeks or longer. If adhesive strip edges start to loosen and curl up, you may trim the loose edges. Do not remove adhesive strips completely unless your health care provider tells you to do that.  Check your incision area every day for signs of infection. Check for: ? Redness, swelling, or pain. ? Fluid or blood. ? Warmth. ? Pus or a bad smell. Do not take baths, swim, or use a hot tub until your incision is completely healed.  Activity  Return to your normal activities.  General  instructions  Follow instructions from your health care provider about how to manage your implantable loop recorder and transmit the information. Learn how to activate a recording if this is necessary for your type of device.  Do not go through a metal detection gate, and do not let someone hold a metal detector over your chest. Show your ID card.  Do not have an MRI unless you check with your health care provider first.  Take over-the-counter and prescription medicines only as told by your health care provider.  Keep all follow-up visits as told by your health care provider. This is important. Contact a health care provider if:  You have redness, swelling, or pain around your incision.  You have a fever.  You have pain that is not relieved by your pain medicine.  You have triggered your device because of fainting (syncope) or because of a heartbeat that feels like it is racing, slow, fluttering, or skipping (palpitations). Get help right away if you have:  Chest pain.  Difficulty breathing. Summary  After the procedure, it is common to have soreness or discomfort near the incision.  Change your dressing as told by your health care provider.  Follow instructions from your health care provider about how to manage your implantable loop recorder and transmit the information.  Keep all follow-up visits as told by your health care provider. This is important. This information is not intended to replace advice given to you by your health care provider. Make sure you discuss any questions you have with your health  care provider. Document Released: 05/11/2015 Document Revised: 07/15/2017 Document Reviewed: 07/15/2017 Elsevier Patient Education  2020 [employer].
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
## 2019-05-24 ENCOUNTER — Encounter: Payer: Self-pay | Admitting: Internal Medicine

## 2019-05-24 ENCOUNTER — Ambulatory Visit (INDEPENDENT_AMBULATORY_CARE_PROVIDER_SITE_OTHER): Payer: Medicare Other | Admitting: Internal Medicine

## 2019-05-24 VITALS — BP 130/76 | HR 86 | Ht 66.0 in | Wt 186.6 lb

## 2019-05-24 DIAGNOSIS — K580 Irritable bowel syndrome with diarrhea: Secondary | ICD-10-CM | POA: Diagnosis not present

## 2019-05-24 DIAGNOSIS — R141 Gas pain: Secondary | ICD-10-CM | POA: Diagnosis not present

## 2019-05-24 DIAGNOSIS — R142 Eructation: Secondary | ICD-10-CM

## 2019-05-24 DIAGNOSIS — K2950 Unspecified chronic gastritis without bleeding: Secondary | ICD-10-CM | POA: Diagnosis not present

## 2019-05-24 DIAGNOSIS — Z8711 Personal history of peptic ulcer disease: Secondary | ICD-10-CM

## 2019-05-24 DIAGNOSIS — R143 Flatulence: Secondary | ICD-10-CM | POA: Diagnosis not present

## 2019-05-24 DIAGNOSIS — Z86010 Personal history of colonic polyps: Secondary | ICD-10-CM

## 2019-05-24 MED ORDER — RIFAXIMIN 550 MG PO TABS: 550.0000 mg | ORAL_TABLET | Freq: Three times a day (TID) | ORAL | 0 refills | Status: DC

## 2019-05-24 MED ORDER — PANTOPRAZOLE SODIUM 40 MG PO TBEC: 40.0000 mg | DELAYED_RELEASE_TABLET | Freq: Two times a day (BID) | ORAL | 1 refills | Status: DC

## 2019-05-24 NOTE — Patient Instructions (Addendum)
If you are age 66 or older, your body mass index should be between 23-30. Your Body mass index is 30.12 kg/m. If this is out of the aforementioned range listed, please consider follow up with your Primary Care Provider.  If you are age 49 or younger, your body mass index should be between 19-25. Your Body mass index is 30.12 kg/m. If this is out of the aformentioned range listed, please consider follow up with your Primary Care Provider.   Continue Protonix 40 mg; Try taking it once a day, and check on symptoms.  If symptoms end up getting worse again, you can go back up to twice daily dosing, but let us know you are doing this.  Follow a low gas diet. Handout given today.  We have sent the following medications to your pharmacy for you to pick up at your convenience: Xifaxan three times daily   It was a pleasure to see you today!
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
## 2019-05-28 ENCOUNTER — Telehealth: Payer: Self-pay | Admitting: *Deleted

## 2019-05-28 NOTE — Telephone Encounter (Signed)
Patient's [name] has been approved through 06/10/19 under Medicare Part D. Reference #PA [id number].

## 2019-05-29 ENCOUNTER — Encounter: Payer: Self-pay | Admitting: Podiatry

## 2019-05-29 ENCOUNTER — Other Ambulatory Visit: Payer: Self-pay

## 2019-05-29 ENCOUNTER — Ambulatory Visit (INDEPENDENT_AMBULATORY_CARE_PROVIDER_SITE_OTHER): Payer: Medicare Other | Admitting: Podiatry

## 2019-05-29 DIAGNOSIS — L97511 Non-pressure chronic ulcer of other part of right foot limited to breakdown of skin: Secondary | ICD-10-CM

## 2019-05-29 DIAGNOSIS — E1142 Type 2 diabetes mellitus with diabetic polyneuropathy: Secondary | ICD-10-CM

## 2019-05-29 DIAGNOSIS — E08621 Diabetes mellitus due to underlying condition with foot ulcer: Secondary | ICD-10-CM | POA: Diagnosis not present

## 2019-05-29 NOTE — Progress Notes (Signed)
Subjective:   Ms.  Donda Demoret Kuhl presents for continued care of ulceration of plantar aspect right foot. She relates area is tender with and without shoe gear. Denies any drainage on socks. Denies any odor, redness or swelling. Patient denies any fever, chills, nightsweats, nausea or vomiting.  Current Outpatient Medications on File Prior to Visit  Medication Sig Dispense Refill  . albuterol (PROVENTIL HFA;VENTOLIN HFA) 108 (90 Base) MCG/ACT inhaler Inhale 1-2 puffs into the lungs every 6 (six) hours as needed for wheezing or shortness of breath. 1 Inhaler 5  . amLODipine (NORVASC) 10 MG tablet TAKE 1 TABLET BY MOUTH ONCE DAILY 90 tablet 1  . aspirin EC 81 MG tablet Take 81 mg by mouth daily.    . betamethasone dipropionate (DIPROLENE) 0.05 % cream Apply topically 2 (two) times daily. 30 g 0  . butalbital-acetaminophen-caffeine (FIORICET) 50-325-40 MG tablet Take 1-2 tablets by mouth every 6 (six) hours as needed for pain.    . cetirizine (ZYRTEC) 5 MG tablet Take 1 tablet (5 mg total) by mouth daily. As needed for hand itchiness. 30 tablet 0  . gabapentin (NEURONTIN) 600 MG tablet Take 1 tablet (600 mg total) by mouth 3 (three) times daily. 270 tablet 3  . metFORMIN (GLUMETZA) 1000 MG (MOD) 24 hr tablet Take 1 tablet (1,000 mg total) by mouth 2 (two) times daily with a meal. 180 tablet 1  . oxybutynin (DITROPAN) 5 MG tablet     . oxyCODONE-acetaminophen (PERCOCET/ROXICET) 5-325 MG tablet     . pantoprazole (PROTONIX) 40 MG tablet Take 1 tablet (40 mg total) by mouth 2 (two) times daily. 60 tablet 1  . rifaximin (XIFAXAN) 550 MG TABS tablet Take 1 tablet (550 mg total) by mouth 3 (three) times daily. 42 tablet 0  . rosuvastatin (CRESTOR) 40 MG tablet Take 1 tablet (40 mg total) by mouth daily. 90 tablet 0  . sertraline (ZOLOFT) 100 MG tablet TAKE 1 TABLET(100 MG) BY MOUTH DAILY 90 tablet 0  . sitaGLIPtin (JANUVIA) 50 MG tablet TAKE 1 TABLET(50 MG) BY MOUTH DAILY 90 tablet 0   No current  facility-administered medications on file prior to visit.     Allergies  Allergen Reactions  . Flexeril [Cyclobenzaprine] Other (See Comments)    Prolonged QTc to 571, tachycardia  . Propofol Other (See Comments)    Hallucinations.  Manuela Neptune [Carisoprodol] Itching and Rash    Objective:   Vascular Examination: Capillary refill time to digits <3 seconds b/l.  Dorsalis pedis and posterior tibial pulses palpable b/l.  Digital hair absent b/l.   Skin temperature gradient WNL b/l.  Dermatological Examination: Skin thin, shiny and atrophic b/l.  Toenails b/l adequate length b/l.  Ulceration located submet head 5 right foot: Predebridement measurements carried out today of 2.5 x 2.5 cm with subdermal hemorrhage.  No periulcerative erythema, no edema, no drainage.  Flocculence absent.  Malodor absent.  Postdebridement measurements today are: 2.5 x 2.5 cm. Partial thickness.  No tracking, no tunneling, no undermining. No probing to bone, no purulent drainage.  No deep abscess evident.  Musculoskeletal: Muscle strength 5/5 to all LE muscle groups bilaterally.  Status post amputation right hallux.  Right 2nd digit drifting medially at PIPJ exposing prominent bony deformity mediallly. No open wound.  Neurological: Sensation absent bilaterally with 10 gram monofilament.  Assessment:   1.  Diabetic Ulceration, noninfected partial thickness submet head 5 right foot 2.  Drifting right 2nd digit 3.  NIDDM with peripheral neuropathy  Plan: 1.  Ulcer was debrided to the level of healthy, viable tissue. Ulcer was cleansed with wound cleanser.Light dressing applied. Continue diabetic shoes with offloaded inserts.  2. For drifting 2nd digit right foot, dispense Silicone toe pad to prevent pressure on bony prominence. Apply every morning, remove every evening. 3. Patient was given instructions on offloading and dressing change/aftercare and was instructed to call immediately if any signs or  symptoms of infection arise.  4. Patient is to follow up 3 weeks, sooner if she has any problems. 5. Patient instructed to report to emergency department with worsening appearance of ulcer/toe/foot, increased pain, foul odor, increased redness, swelling, drainage, fever, chills, nightsweats, nausea, vomiting, increased blood sugar.  6. Patient/POA related understanding.

## 2019-05-29 NOTE — Patient Instructions (Signed)

## 2019-05-31 ENCOUNTER — Telehealth: Payer: Self-pay | Admitting: *Deleted

## 2019-05-31 ENCOUNTER — Telehealth (INDEPENDENT_AMBULATORY_CARE_PROVIDER_SITE_OTHER): Payer: Medicare Other | Admitting: *Deleted

## 2019-05-31 ENCOUNTER — Other Ambulatory Visit: Payer: Self-pay

## 2019-05-31 DIAGNOSIS — Z95818 Presence of other cardiac implants and grafts: Secondary | ICD-10-CM

## 2019-05-31 DIAGNOSIS — R55 Syncope and collapse: Secondary | ICD-10-CM

## 2019-05-31 LAB — CUP PACEART REMOTE DEVICE CHECK
Date Time Interrogation Session: 20201218174402
Implantable Pulse Generator Implant Date: 20201210

## 2019-05-31 NOTE — Telephone Encounter (Signed)
Able to reach patient. See encounter for details.

## 2019-05-31 NOTE — Telephone Encounter (Signed)
LMOVM requesting call back to DC to complete virtual visit wound check.

## 2019-05-31 NOTE — Progress Notes (Signed)
Loop recorder wound check via virtual visit. Patient sent in wound photo for review. Incision edges appear fully approximated and well healed. Patient denies signs/symptoms of infection. Educated about wound care, Carelink monitor, and symptom activator.  Nightly Carelink transmission reviewed. No symptom, tachy, pause, brady, or AF episodes since implant. Monthly summary reports and ROV with Dr. Rayann Heman PRN.

## 2019-06-04 ENCOUNTER — Other Ambulatory Visit: Payer: Self-pay | Admitting: *Deleted

## 2019-06-04 ENCOUNTER — Ambulatory Visit (INDEPENDENT_AMBULATORY_CARE_PROVIDER_SITE_OTHER): Payer: Medicare Other | Admitting: Internal Medicine

## 2019-06-04 ENCOUNTER — Encounter: Payer: Self-pay | Admitting: Internal Medicine

## 2019-06-04 ENCOUNTER — Other Ambulatory Visit: Payer: Self-pay

## 2019-06-04 VITALS — BP 130/71 | HR 60 | Temp 98.3°F | Ht 66.0 in | Wt 192.4 lb

## 2019-06-04 DIAGNOSIS — K2950 Unspecified chronic gastritis without bleeding: Secondary | ICD-10-CM

## 2019-06-04 DIAGNOSIS — S161XXA Strain of muscle, fascia and tendon at neck level, initial encounter: Secondary | ICD-10-CM | POA: Diagnosis not present

## 2019-06-04 DIAGNOSIS — E118 Type 2 diabetes mellitus with unspecified complications: Secondary | ICD-10-CM

## 2019-06-04 DIAGNOSIS — M542 Cervicalgia: Secondary | ICD-10-CM | POA: Insufficient documentation

## 2019-06-04 LAB — POCT GLYCOSYLATED HEMOGLOBIN (HGB A1C): Hemoglobin A1C: 11.2 % — AB (ref 4.0–5.6)

## 2019-06-04 LAB — GLUCOSE, CAPILLARY: Glucose-Capillary: 229 mg/dL — ABNORMAL HIGH (ref 70–99)

## 2019-06-04 MED ORDER — ONETOUCH DELICA PLUS LANCET33G MISC: 1.0000 | Freq: Two times a day (BID) | 6 refills | Status: DC

## 2019-06-04 MED ORDER — PANTOPRAZOLE SODIUM 40 MG PO TBEC: 40.0000 mg | DELAYED_RELEASE_TABLET | Freq: Two times a day (BID) | ORAL | 0 refills | Status: DC

## 2019-06-04 MED ORDER — BACLOFEN 5 MG PO TABS: 5.0000 mg | ORAL_TABLET | Freq: Two times a day (BID) | ORAL | 0 refills | Status: DC | PRN

## 2019-06-04 MED ORDER — ONETOUCH VERIO VI STRP: ORAL_STRIP | 12 refills | Status: DC

## 2019-06-04 MED ORDER — CANAGLIFLOZIN 100 MG PO TABS: 300.0000 mg | ORAL_TABLET | Freq: Every day | ORAL | 2 refills | Status: DC

## 2019-06-04 NOTE — Patient Instructions (Addendum)
[name],   For your neck pain, please take 1 tablet of baclofen every 8 to 12 hours as needed for the next 2-3 days.   Your A1c was very high. We will start a new medication for this called Invokana. You will take one tablet every day. If it is too expensive let us know.   Continue taking your medications as usual.   - Dr. Frederico Hamman

## 2019-06-05 ENCOUNTER — Encounter: Payer: Self-pay | Admitting: Internal Medicine

## 2019-06-05 LAB — BMP8+ANION GAP
Anion Gap: 16 mmol/L (ref 10.0–18.0)
BUN/Creatinine Ratio: 16 (ref 12–28)
BUN: 19 mg/dL (ref 8–27)
CO2: 18 mmol/L — ABNORMAL LOW (ref 20–29)
Calcium: 9.7 mg/dL (ref 8.7–10.3)
Chloride: 104 mmol/L (ref 96–106)
Creatinine, Ser: 1.21 mg/dL — ABNORMAL HIGH (ref 0.57–1.00)
GFR calc Af Amer: 54 mL/min/{1.73_m2} — ABNORMAL LOW (ref >59)
GFR calc non Af Amer: 47 mL/min/{1.73_m2} — ABNORMAL LOW (ref >59)
Glucose: 239 mg/dL — ABNORMAL HIGH (ref 65–99)
Potassium: 5 mmol/L (ref 3.5–5.2)
Sodium: 138 mmol/L (ref 134–144)
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
## 2019-06-05 NOTE — Assessment & Plan Note (Addendum)
Patient presents for T2DM follow up. She is on maximum dose of metformin and 50 mg of Januvia. She is compliant. Unfortunately A1c 9.2 --> 11.2. She thinks this is due to dietary indiscretion. Has been drinking large amount of orange juice and foods high in carbohydrates. She denies polydipsia and polyuria, urinary urgency and frequency. We discussed adding another medication to her regimen for better glycemic control. She was in agreement with this. Started Invokana 100 mg every day as she does not have any contraindications for SGLT2-inhibitors at this time. I did discuss side effects of the medication and advised her to call us if she experiences any.

## 2019-06-05 NOTE — Assessment & Plan Note (Signed)
Patient reports a history of chronic neck pain and muscle spasms related to a previous work injury. This tends to flare up once in a while, most recently 3 days ago. She has tried Tylenol, heating pads, ice, and icy/hot patches without relief. She used to be on flexeril for many years but experienced some side effects (QT prolongation and tachycardia listed on allergy list) and is no longer able to take it. Recommended a short course of baclofen 5 mg BID to TID PRN x 2-3 days.

## 2019-06-10 NOTE — Progress Notes (Signed)
Internal Medicine Clinic Attending  Case discussed with Dr. Santos-Sanchez at the time of the visit.  We reviewed the resident's history and exam and pertinent patient test results.  I agree with the assessment, diagnosis, and plan of care documented in the resident's note.    

## 2019-06-18 ENCOUNTER — Other Ambulatory Visit: Payer: Self-pay | Admitting: Internal Medicine

## 2019-06-18 DIAGNOSIS — E118 Type 2 diabetes mellitus with unspecified complications: Secondary | ICD-10-CM

## 2019-06-18 NOTE — Telephone Encounter (Signed)
Pt reporting  She needs help with getting a  30 day supply instead of the way she is currently recieving it.  Pt states she can not afford to pay for it  3 times a month.  Please call the patient back.   canagliflozin (INVOKANA) 100 MG TABS tablet  [id number] - [place], [place] - [address]

## 2019-06-19 ENCOUNTER — Ambulatory Visit (INDEPENDENT_AMBULATORY_CARE_PROVIDER_SITE_OTHER): Payer: Medicare Other | Admitting: Podiatry

## 2019-06-19 ENCOUNTER — Other Ambulatory Visit: Payer: Self-pay | Admitting: *Deleted

## 2019-06-19 ENCOUNTER — Encounter: Payer: Self-pay | Admitting: Podiatry

## 2019-06-19 ENCOUNTER — Other Ambulatory Visit: Payer: Self-pay

## 2019-06-19 DIAGNOSIS — E1142 Type 2 diabetes mellitus with diabetic polyneuropathy: Secondary | ICD-10-CM | POA: Diagnosis not present

## 2019-06-19 DIAGNOSIS — L97511 Non-pressure chronic ulcer of other part of right foot limited to breakdown of skin: Secondary | ICD-10-CM | POA: Diagnosis not present

## 2019-06-19 DIAGNOSIS — E08621 Diabetes mellitus due to underlying condition with foot ulcer: Secondary | ICD-10-CM | POA: Diagnosis not present

## 2019-06-19 MED ORDER — CANAGLIFLOZIN 100 MG PO TABS: 300.0000 mg | ORAL_TABLET | Freq: Every day | ORAL | 1 refills | Status: DC

## 2019-06-19 NOTE — Telephone Encounter (Signed)
What do you mean by the patient cannot afford to pay for the medication three times  a month. She is getting a 90 day supply

## 2019-06-19 NOTE — Patient Instructions (Signed)

## 2019-06-19 NOTE — Patient Outreach (Signed)
[facility] [facility]) Care Management  06/19/2019  [name] March 05, 1951 [id number]   RN Health Coach attempted follow up outreach call to patient.  Patient was unavailable. HIPPA compliance voicemail message left with return callback number.  Plan: RN will call patient again within 10 days.  [place] Care Management [phone]

## 2019-06-23 NOTE — Progress Notes (Signed)
Subjective:   [name] presents for continued care of ulceration of plantarlateral right foot.  Patient has not been wearing her diabetic shoes nor [place] shoe. She is wearing house slippers and socks on today's visit. She denies any redness, drainage or swelling from area. She notes she now has pain on outside of her right foot where her lesion is located.  Current Outpatient Medications on File Prior to Visit  Medication Sig Dispense Refill  . albuterol (PROVENTIL HFA;VENTOLIN HFA) 108 (90 Base) MCG/ACT inhaler Inhale 1-2 puffs into the lungs every 6 (six) hours as needed for wheezing or shortness of breath. 1 Inhaler 5  . amLODipine (NORVASC) 10 MG tablet TAKE 1 TABLET BY MOUTH ONCE DAILY 90 tablet 1  . aspirin EC 81 MG tablet Take 81 mg by mouth daily.    . baclofen 5 MG TABS Take 5 mg by mouth 2 (two) times daily as needed for muscle spasms. 10 tablet 0  . butalbital-acetaminophen-caffeine (FIORICET) 50-325-40 MG tablet Take 1-2 tablets by mouth every 6 (six) hours as needed for pain.    . canagliflozin (INVOKANA) 100 MG TABS tablet Take 3 tablets (300 mg total) by mouth daily before breakfast. 270 tablet 1  . cetirizine (ZYRTEC) 5 MG tablet Take 1 tablet (5 mg total) by mouth daily. As needed for hand itchiness. 30 tablet 0  . gabapentin (NEURONTIN) 600 MG tablet Take 1 tablet (600 mg total) by mouth 3 (three) times daily. 270 tablet 3  . glucose blood (ONETOUCH VERIO) test strip Use as instructed 100 each 12  . Lancets (ONETOUCH DELICA PLUS 123XX123) MISC 1 each by Does not apply route 2 (two) times daily. 100 each 6  . metFORMIN (GLUMETZA) 1000 MG (MOD) 24 hr tablet Take 1 tablet (1,000 mg total) by mouth 2 (two) times daily with a meal. 180 tablet 1  . pantoprazole (PROTONIX) 40 MG tablet Take 1 tablet (40 mg total) by mouth 2 (two) times daily. Please discontinue rx for monthly rx 180 tablet 0  . rifaximin (XIFAXAN) 550 MG TABS tablet Take 1 tablet (550 mg total) by mouth 3  (three) times daily. 42 tablet 0  . rosuvastatin (CRESTOR) 40 MG tablet Take 1 tablet (40 mg total) by mouth daily. 90 tablet 0  . sertraline (ZOLOFT) 100 MG tablet TAKE 1 TABLET(100 MG) BY MOUTH DAILY 90 tablet 0  . sitaGLIPtin (JANUVIA) 50 MG tablet TAKE 1 TABLET(50 MG) BY MOUTH DAILY 90 tablet 0   No current facility-administered medications on file prior to visit.     Allergies  Allergen Reactions  . Flexeril [Cyclobenzaprine] Other (See Comments)    Prolonged QTc to 571, tachycardia  . Propofol Other (See Comments)    Hallucinations.  Manuela Neptune [Carisoprodol] Itching and Rash   Objective:   Vascular Examination: There were no vitals filed for this visit.  Capillary refill time to remaining digits <3 seconds.    Dorsalis pedis pulses palpable b/l.  Posterior tibial pulses palpable b/l.  Digital hair absent b/l.   Skin temperature gradient WNL b/l.   Dermatological Examination: Skin thin, shiny and atrophic b/l.  Toenails 1-5 b/l adequate length.  Ulceration located submet head 5 right foot: Predebridement measurements carried out today of 2.5 x 2.5 cm.  No periulcerative erythema, no edema, no drainage.  No flocculence, no malodor.   Postdebridement measurements today are: 2.5 x 2.5 x 0.1 cm, partial thickness.  No tracking, no tunneling, no undermining. No probing to bone, no  purulent drainage.  No deep abscess evident.  Musculoskeletal: Muscle strength 5/5 to all LE muscle groups bilaterally.  Status post amputation right hallux. Drifting of right 2nd digit.  Neurological: Sensation absent bilaterally with 10 gram monofilament.  Assessment:   1.    Diabetic  Ulceration right foot 2.    NIDDM with peripheral neuropathy  Plan: 1. Ulcer was debrided to the level of healthy, viable tissue. Ulcer was cleansed with wound cleanser. Triple antibiotic ointment applied. 2. Dispensed tube foam Tailor's bunion shield.  3. She is to wear her diabetic shoes daily.   4. Patient is to follow up 3 weeks. 5. Patient instructed to report to emergency department with worsening appearance of ulcer/toe/foot, increased pain, foul odor, increased redness, swelling, drainage, fever, chills, nightsweats, nausea, vomiting, increased blood sugar.  6. Patient/POA related understanding.

## 2019-06-25 ENCOUNTER — Ambulatory Visit (INDEPENDENT_AMBULATORY_CARE_PROVIDER_SITE_OTHER): Payer: Medicare Other | Admitting: *Deleted

## 2019-06-25 DIAGNOSIS — R55 Syncope and collapse: Secondary | ICD-10-CM | POA: Diagnosis not present

## 2019-06-25 LAB — CUP PACEART REMOTE DEVICE CHECK
Date Time Interrogation Session: 20210112120242
Implantable Pulse Generator Implant Date: 20201210

## 2019-06-26 ENCOUNTER — Other Ambulatory Visit: Payer: Self-pay | Admitting: *Deleted

## 2019-06-26 NOTE — Patient Outreach (Signed)
[place] [facility]) Care Management  06/26/2019  Mey Sulak 1951-04-23 [id number]   RN Health Coach attempted follow up outreach call to patient.  Patient was unavailable. Per message number is invalid.  Plan:  RN will send unsuccessful outreach letter Closure in 10 Business days if no response.  [place] Care Management [phone]

## 2019-07-10 ENCOUNTER — Other Ambulatory Visit: Payer: Self-pay | Admitting: *Deleted

## 2019-07-10 NOTE — Patient Outreach (Addendum)
[place]) Care Management  07/10/2019  [name] June 29, 1950 [id number]  RN Health Coach telephone call to patient.  [place] compliance verified. Per patient she was on her way to go grocery shopping. RN made patient aware that she could call her back. RN made patient aware that she is the nurse that is trying to help her with the 11.2 A1C.  The patient has not returned any calls or responded to the outreach letter.  Plan: Patient has return call back number RN will wait 10 business days If no response. RN will make Dr aware  [place] Management [phone]

## 2019-07-22 ENCOUNTER — Encounter: Payer: Self-pay | Admitting: Neurology

## 2019-07-23 ENCOUNTER — Encounter: Payer: Self-pay | Admitting: Neurology

## 2019-07-23 ENCOUNTER — Other Ambulatory Visit: Payer: Self-pay

## 2019-07-23 ENCOUNTER — Telehealth (INDEPENDENT_AMBULATORY_CARE_PROVIDER_SITE_OTHER): Payer: Medicare Other | Admitting: Neurology

## 2019-07-23 VITALS — Ht 66.0 in | Wt 190.0 lb

## 2019-07-23 DIAGNOSIS — G629 Polyneuropathy, unspecified: Secondary | ICD-10-CM

## 2019-07-23 DIAGNOSIS — G894 Chronic pain syndrome: Secondary | ICD-10-CM

## 2019-07-23 DIAGNOSIS — R55 Syncope and collapse: Secondary | ICD-10-CM | POA: Diagnosis not present

## 2019-07-23 DIAGNOSIS — G4486 Cervicogenic headache: Secondary | ICD-10-CM

## 2019-07-23 DIAGNOSIS — R519 Headache, unspecified: Secondary | ICD-10-CM | POA: Diagnosis not present

## 2019-07-23 DIAGNOSIS — I671 Cerebral aneurysm, nonruptured: Secondary | ICD-10-CM | POA: Diagnosis not present

## 2019-07-23 MED ORDER — GABAPENTIN 600 MG PO TABS: ORAL_TABLET | ORAL | 3 refills | Status: DC
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
## 2019-07-24 ENCOUNTER — Other Ambulatory Visit: Payer: Self-pay | Admitting: *Deleted

## 2019-07-24 NOTE — Patient Outreach (Signed)
[facility]) Care Management  07/24/2019  [name] 14-Sep-1950 [id number]   Outreach attempts: Multiple attempts to establish contact with patient without success. No response from letter mailed to patient.   Plan: RN Health Coach with close discipline based on inability to establish contact with patient  [facility] [phone]

## 2019-07-25 ENCOUNTER — Other Ambulatory Visit: Payer: Self-pay | Admitting: Internal Medicine

## 2019-07-29 ENCOUNTER — Ambulatory Visit (INDEPENDENT_AMBULATORY_CARE_PROVIDER_SITE_OTHER): Payer: Medicare Other | Admitting: *Deleted

## 2019-07-29 DIAGNOSIS — R55 Syncope and collapse: Secondary | ICD-10-CM | POA: Diagnosis not present

## 2019-07-29 LAB — CUP PACEART REMOTE DEVICE CHECK
Date Time Interrogation Session: 20210214230223
Implantable Pulse Generator Implant Date: 20201210

## 2019-07-30 NOTE — Progress Notes (Signed)
ILR Remote 

## 2019-07-31 ENCOUNTER — Ambulatory Visit (INDEPENDENT_AMBULATORY_CARE_PROVIDER_SITE_OTHER): Payer: Medicare Other | Admitting: Podiatry

## 2019-07-31 ENCOUNTER — Other Ambulatory Visit: Payer: Self-pay

## 2019-07-31 ENCOUNTER — Encounter: Payer: Self-pay | Admitting: Podiatry

## 2019-07-31 DIAGNOSIS — B351 Tinea unguium: Secondary | ICD-10-CM | POA: Diagnosis not present

## 2019-07-31 DIAGNOSIS — L84 Corns and callosities: Secondary | ICD-10-CM

## 2019-07-31 DIAGNOSIS — S98111A Complete traumatic amputation of right great toe, initial encounter: Secondary | ICD-10-CM

## 2019-07-31 DIAGNOSIS — E1142 Type 2 diabetes mellitus with diabetic polyneuropathy: Secondary | ICD-10-CM | POA: Diagnosis not present

## 2019-07-31 NOTE — Patient Instructions (Signed)
Diabetes Mellitus and Foot Care Foot care is an important part of your health, especially when you have diabetes. Diabetes may cause you to have problems because of poor blood flow (circulation) to your feet and legs, which can cause your skin to:  Become thinner and drier.  Break more easily.  Heal more slowly.  Peel and crack. You may also have nerve damage (neuropathy) in your legs and feet, causing decreased feeling in them. This means that you may not notice minor injuries to your feet that could lead to more serious problems. Noticing and addressing any potential problems early is the best way to prevent future foot problems. How to care for your feet Foot hygiene  Wash your feet daily with warm water and mild soap. Do not use hot water. Then, pat your feet and the areas between your toes until they are completely dry. Do not soak your feet as this can dry your skin.  Trim your toenails straight across. Do not dig under them or around the cuticle. File the edges of your nails with an emery board or nail file.  Apply a moisturizing lotion or petroleum jelly to the skin on your feet and to dry, brittle toenails. Use lotion that does not contain alcohol and is unscented. Do not apply lotion between your toes. Shoes and socks  Wear clean socks or stockings every day. Make sure they are not too tight. Do not wear knee-high stockings since they may decrease blood flow to your legs.  Wear shoes that fit properly and have enough cushioning. Always look in your shoes before you put them on to be sure there are no objects inside.  To break in new shoes, wear them for just a few hours a day. This prevents injuries on your feet. Wounds, scrapes, corns, and calluses  Check your feet daily for blisters, cuts, bruises, sores, and redness. If you cannot see the bottom of your feet, use a mirror or ask someone for help.  Do not cut corns or calluses or try to remove them with medicine.  If you  find a minor scrape, cut, or break in the skin on your feet, keep it and the skin around it clean and dry. You may clean these areas with mild soap and water. Do not clean the area with peroxide, alcohol, or iodine.  If you have a wound, scrape, corn, or callus on your foot, look at it several times a day to make sure it is healing and not infected. Check for: ? Redness, swelling, or pain. ? Fluid or blood. ? Warmth. ? Pus or a bad smell. General instructions  Do not cross your legs. This may decrease blood flow to your feet.  Do not use heating pads or hot water bottles on your feet. They may burn your skin. If you have lost feeling in your feet or legs, you may not know this is happening until it is too late.  Protect your feet from hot and cold by wearing shoes, such as at the beach or on hot pavement.  Schedule a complete foot exam at least once a year (annually) or more often if you have foot problems. If you have foot problems, report any cuts, sores, or bruises to your health care provider immediately. Contact a health care provider if:  You have a medical condition that increases your risk of infection and you have any cuts, sores, or bruises on your feet.  You have an injury that is not   healing.  You have redness on your legs or feet.  You feel burning or tingling in your legs or feet.  You have pain or cramps in your legs and feet.  Your legs or feet are numb.  Your feet always feel cold.  You have pain around a toenail. Get help right away if:  You have a wound, scrape, corn, or callus on your foot and: ? You have pain, swelling, or redness that gets worse. ? You have fluid or blood coming from the wound, scrape, corn, or callus. ? Your wound, scrape, corn, or callus feels warm to the touch. ? You have pus or a bad smell coming from the wound, scrape, corn, or callus. ? You have a fever. ? You have a red line going up your leg. Summary  Check your feet every day  for cuts, sores, red spots, swelling, and blisters.  Moisturize feet and legs daily.  Wear shoes that fit properly and have enough cushioning.  If you have foot problems, report any cuts, sores, or bruises to your health care provider immediately.  Schedule a complete foot exam at least once a year (annually) or more often if you have foot problems. This information is not intended to replace advice given to you by your health care provider. Make sure you discuss any questions you have with your health care provider. Document Revised: 02/20/2019 Document Reviewed: 07/01/2016 Elsevier Patient Education  2020 [employer]  

## 2019-08-02 ENCOUNTER — Other Ambulatory Visit: Payer: Self-pay

## 2019-08-02 ENCOUNTER — Telehealth: Payer: Self-pay | Admitting: Neurology

## 2019-08-02 DIAGNOSIS — G894 Chronic pain syndrome: Secondary | ICD-10-CM

## 2019-08-02 DIAGNOSIS — G629 Polyneuropathy, unspecified: Secondary | ICD-10-CM

## 2019-08-02 NOTE — Telephone Encounter (Signed)
Pt called informed that referral was in and she should hear from them soon

## 2019-08-02 NOTE — Telephone Encounter (Signed)
Patient called to check on the status of her referral to pain management.

## 2019-08-04 NOTE — Progress Notes (Signed)
Subjective:  "My foot hurts. Feels like my toe is drawing up."  [name] presents for at-risk foot care. She is s/p hallux amputation  right foot.  Also has NIDDM with neuropathy.   She relates pain on plantar aspect right foot where she has h/o partial thickness ulceration. She denies any redness, drainage, warmth or odor from lesion. She does wear flip flops mostly and not her diabetic shoes. Patient denies any fever, chills, nightsweats, nausea or vomiting.  Current Outpatient Medications on File Prior to Visit  Medication Sig Dispense Refill  . albuterol (PROVENTIL HFA;VENTOLIN HFA) 108 (90 Base) MCG/ACT inhaler Inhale 1-2 puffs into the lungs every 6 (six) hours as needed for wheezing or shortness of breath. 1 Inhaler 5  . amLODipine (NORVASC) 10 MG tablet TAKE 1 TABLET BY MOUTH EVERY DAY 90 tablet 1  . aspirin EC 81 MG tablet Take 81 mg by mouth daily.    . baclofen 5 MG TABS Take 5 mg by mouth 2 (two) times daily as needed for muscle spasms. 10 tablet 0  . butalbital-acetaminophen-caffeine (FIORICET) 50-325-40 MG tablet Take 1-2 tablets by mouth every 6 (six) hours as needed for pain.    . canagliflozin (INVOKANA) 100 MG TABS tablet Take 3 tablets (300 mg total) by mouth daily before breakfast. 270 tablet 1  . cetirizine (ZYRTEC) 5 MG tablet Take 1 tablet (5 mg total) by mouth daily. As needed for hand itchiness. 30 tablet 0  . gabapentin (NEURONTIN) 600 MG tablet Take 1.5 tabs three times a day 405 tablet 3  . glucose blood (ONETOUCH VERIO) test strip Use as instructed 100 each 12  . Lancets (ONETOUCH DELICA PLUS [id number]) MISC 1 each by Does not apply route 2 (two) times daily. 100 each 6  . metFORMIN (GLUMETZA) 1000 MG (MOD) 24 hr tablet Take 1 tablet (1,000 mg total) by mouth 2 (two) times daily with a meal. 180 tablet 1  . pantoprazole (PROTONIX) 40 MG tablet Take 1 tablet (40 mg total) by mouth 2 (two) times daily. Please discontinue rx for monthly rx 180 tablet 0  .  rifaximin (XIFAXAN) 550 MG TABS tablet Take 1 tablet (550 mg total) by mouth 3 (three) times daily. 42 tablet 0  . rosuvastatin (CRESTOR) 40 MG tablet TAKE 1 TABLET(40 MG) BY MOUTH DAILY 90 tablet 1  . sertraline (ZOLOFT) 100 MG tablet TAKE 1 TABLET(100 MG) BY MOUTH DAILY 90 tablet 0  . sitaGLIPtin (JANUVIA) 50 MG tablet TAKE 1 TABLET(50 MG) BY MOUTH DAILY 90 tablet 0   No current facility-administered medications on file prior to visit.     Allergies  Allergen Reactions  . Flexeril [Cyclobenzaprine] Other (See Comments)    Prolonged QTc to 571, tachycardia  . Propofol Other (See Comments)    Hallucinations.  Manuela Neptune [Carisoprodol] Itching and Rash    Objective:   Vascular Examination: There were no vitals filed for this visit.  Capillary refill time <3 seconds x 10 digits.  Palpable pedal pulses b/l.   Digital hair diminished b/l feet.  Skin temperature gradient WNL b/l.  Dermatological Examination: Skin thin, shiny and atrophic b/l.  Toenails left hallux, 3-5 left and 2-5 right discolored, thick, dystrophic with subungual debris and pain with palpation to nailbeds due to thickness of nails.  Area of ulceration plantar aspect of right foot completely epithelialized with porokeratotic lesion with tenderness to palpation. No erythema, no edema, no drainage, no flocculence.  Hyperkeratotic lesion dorsal nailbed right 2nd digit  with subdermal hemorrhage. No nailplate. No erythema, no edema, no drainage, no flocculence noted.  Musculoskeletal: Muscle strength 5/5 to all LE muscle groups bilaterally.  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  Assessment:   1.  Onychomycosis left great toe, 3-5 left, 2-5 right 2. Porokeratosis submet 5 right foot 3. Preulcerative corn left 2nd toe 4.   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    NIDDM with peripheral neuropathy  Plan: 1. Ulcer completely healed right foot. 2. Continue diabetic foot care principles. 3. Toenails left great toe, left 3-5, right 2-5 debrided in length and girth with sterile nail nippers and smoothed with dremel.  4. Porokeratotic lesion submetatarsal 5 right foot pared and enucleated with sterile scalpel blade without iatrogenic bleeding. 5. Patient encouraged to wear her diabetic shoes daily. 6. Return to clinic in 9 weeks for at-risk foot care; sooner if any problems arise in the interim.  7. Patient/POA related understanding.

## 2019-08-05 ENCOUNTER — Ambulatory Visit: Payer: Medicare Other

## 2019-08-05 ENCOUNTER — Encounter: Payer: Self-pay | Admitting: Internal Medicine

## 2019-08-06 ENCOUNTER — Encounter: Payer: Self-pay | Admitting: Physical Medicine and Rehabilitation

## 2019-08-13 ENCOUNTER — Other Ambulatory Visit: Payer: Self-pay

## 2019-08-13 ENCOUNTER — Encounter
Payer: Medicare Other | Attending: Physical Medicine and Rehabilitation | Admitting: Physical Medicine and Rehabilitation

## 2019-08-13 ENCOUNTER — Encounter: Payer: Self-pay | Admitting: Physical Medicine and Rehabilitation

## 2019-08-13 VITALS — BP 109/73 | HR 90 | Temp 97.5°F | Ht 66.0 in | Wt 190.0 lb

## 2019-08-13 DIAGNOSIS — G8929 Other chronic pain: Secondary | ICD-10-CM

## 2019-08-13 DIAGNOSIS — M5441 Lumbago with sciatica, right side: Secondary | ICD-10-CM | POA: Diagnosis not present

## 2019-08-13 DIAGNOSIS — M542 Cervicalgia: Secondary | ICD-10-CM | POA: Diagnosis not present

## 2019-08-13 MED ORDER — DULOXETINE HCL 20 MG PO CPEP: 20.0000 mg | ORAL_CAPSULE | Freq: Every day | ORAL | 3 refills | Status: DC

## 2019-08-13 NOTE — Progress Notes (Signed)
Subjective:    Patient ID: [name], female    DOB: 06/06/1951, 69 y.o.   MRN: [id number]  HPI  [name] presents for severe pain in her bilateral shoulders, neck pain, low back pain, left knee pain, and bilateral ankles.   The pain is currently 5/6 and worst in her low back and neck.   She cannot take Aspirin as she has had bleeding ulcers. She just started receiving Fioricet, which helps with the migraines. If she does not sleep correctly, she feels the pain in the morning. She has been on a multitude of pain medications in the past when she was in [name].   She sometimes gets sciatic pain and peripheral neuropathy and was evaluated as a candidate for an epidural in [name] but was told her disc degeneration was too severe.  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    Pain Inventory Average Pain 5 Pain Right Now 6 My pain is constant, burning, tingling and aching  In the last 24 hours, has pain interfered with the following? General activity 4 Relation with others 3 Enjoyment of life 5 What TIME of day is your pain at its worst? morning, daytime, night  Sleep (in general) Poor  Pain is worse with: walking, bending, sitting, inactivity, standing and some activites Pain improves with: rest and medication Relief from Meds: 1  Mobility walk without assistance walk with assistance use a cane do you drive?  no transfers alone Do you have any goals in this area?  yes  Function not employed: date last employed . disabled: date disabled . retired I need assistance with the following:  bathing and household duties  Neuro/Psych bladder control problems bowel control problems tremor tingling  Prior Studies new  Physicians involved in your care new   Family History  Problem Relation Age of Onset  . Hyperlipidemia Mother   . Heart attack Mother   . Heart attack Father 92  . Hypertension Father   . Cancer Paternal Grandfather          Lung cancer  . Heart attack Paternal Grandfather   . Cancer Paternal Grandmother   . Obesity Daughter        gastric bypass surgery  . Arthritis Sister   . Migraines Daughter   . Chiari malformation Daughter   . Arthritis Daughter   . Noma White syndrome Daughter   . Colon cancer Neg Hx   . Colon polyps Neg Hx   . Esophageal cancer Neg Hx   . Rectal cancer Neg Hx   . Stomach cancer Neg Hx    Social History   Socioeconomic History  . Marital status: Widowed    Spouse name: Not on file  . Number of children: Not on file  . Years of education: 24  . Highest education level: Not on file  Occupational History  . Occupation: Retired  . Occupation: Disabled  Tobacco Use  . Smoking status: Current Every Day Smoker    Packs/day: 0.30    Years: 49.00    Pack years: 14.70  . Smokeless tobacco: Never Used  . Tobacco comment: 3 cigs per day  Substance and Sexual Activity  . Alcohol use: No    Alcohol/week: 0.0 standard drinks  . Drug use: No  . Sexual activity: Not Currently  Other Topics Concern  . Not on file  Social History Narrative   Moved [name], shortly after  the death of her husband, to live with her daughter.  She notes significant life stress related to her 61 year-old grand-daughter with bipolar disorder.  She has previously worked as a Occupational psychologist.      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 There are 2 steps without handrails up to the entrance the patient uses.       Patient's method of transportation is via family member.      The highest level of education was some college: 2 Careers adviser (pharmacy and  psych).      The patient currently Retired/disabled.      Identified important Relationships are "My daughter and granddaughter."      Pets : "2 indoor cats, 1 indoor dog, 1 outdoor dog, and a lizard in a tank."          Left handed   Interests / Fun: "Have fun with my great grandson playing around the house, watching him play outside with his imagination."       Current Stressors: "My granddaughter's fiance and my granddaughter."       Religious / Personal Beliefs: "Protestant"       [name], BSN, RN-BC       Social Determinants of Health   Financial Resource Strain: Medium Risk  . Difficulty of Paying Living Expenses: Somewhat hard  Food Insecurity: Food Insecurity Present  . Worried About Charity fundraiser in the Last Year: Sometimes true  . Ran Out of Food in the Last Year: Sometimes true  Transportation Needs: No Transportation Needs  . Lack of Transportation (Medical): No  . Lack of Transportation (Non-Medical): No  Physical Activity: Inactive  . Days of Exercise per Week: 0 days  . Minutes of Exercise per Session: 0 min  Stress: Stress Concern Present  . Feeling of Stress : To some extent  Social Connections:   . Frequency of Communication with Friends and Family: Not on file  . Frequency of Social Gatherings with Friends and Family: Not on file  . Attends Religious Services: Not on file  . Active Member of Clubs or Organizations: Not on file  . Attends Archivist Meetings: Not on file  . Marital Status: Not on file   Past Surgical History:  Procedure Laterality Date  . AMPUTATION Right 05/01/2015   Procedure: Right Foot 1st Ray Amputation;  Surgeon: Newt Minion, MD;  Location: [place];  Service: Orthopedics;  Laterality: Right;  . ARTHRODESIS METATARSAL     RIGHT 5 TH   . BILATERAL SALPINGOOPHORECTOMY Bilateral 1986   "after hemtoma evacuations; had to cut me open"  . BIOPSY  11/07/2017   Procedure: BIOPSY;  Surgeon: Irene Shipper, MD;   Location: [facility];  Service: Endoscopy;;  . CARPAL TUNNEL RELEASE Bilateral   . COLONOSCOPY     10 + yrs ago- pt unsure- was in [name]- pt states  MD will not send records but colon was normal per pt.   . CYSTOSCOPY W/ URETERAL STENT PLACEMENT Left 09/06/2018   Procedure: CYSTOSCOPY WITH LEFT RETROGRADE PYELOGRAM/URETERAL LEFT DOUBLE J STENT PLACEMENT;  Surgeon: Diona Fanti,  Annie Main, MD;  Location: [place];  Service: Urology;  Laterality: Left;  . DILATION AND CURETTAGE OF UTERUS  ~ 1982   S/P miscarriage  . ESOPHAGOGASTRODUODENOSCOPY (EGD) WITH PROPOFOL N/A 11/07/2017   Procedure: ESOPHAGOGASTRODUODENOSCOPY (EGD) WITH PROPOFOL;  Surgeon: Irene Shipper, MD;  Location: [facility] ENDOSCOPY;  Service: Endoscopy;  Laterality: N/A;  . EXTRACORPOREAL SHOCK WAVE LITHOTRIPSY Left 09/24/2018   Procedure: EXTRACORPOREAL SHOCK WAVE LITHOTRIPSY (ESWL);  Surgeon: Cleon Gustin, MD;  Location: [facility];  Service: Urology;  Laterality: Left;  . HEMATOMA EVACUATION Right 1986 x 2   "OVARY; w/in 1 wk after hysterectomy"  . implantable loop recorder placement  05/23/2019   MD[facility] PO:[id number] G) implanted by Dr Rayann Heman in office for evaluation of syncope  . KNEE ARTHROSCOPY     LEFT  . LAPAROSCOPIC CHOLECYSTECTOMY    . SHOULDER ARTHROSCOPY W/ ROTATOR CUFF REPAIR Bilateral   . TONSILLECTOMY    . TOTAL KNEE ARTHROPLASTY Left 09/30/2016   Procedure: LEFT TOTAL KNEE ARTHROPLASTY;  Surgeon: Netta Cedars, MD;  Location: [place];  Service: Orthopedics;  Laterality: Left;  . TUBAL LIGATION    . VAGINAL HYSTERECTOMY  1986   Past Medical History:  Diagnosis Date  . Acute blood loss anemia 09/27/2018  . Allergy   . Anemia   . Arthritis    "left knee" (09/08/2015)  . Asthma   . Blood transfusion without reported diagnosis 10/2017   anemic  . Brachial plexus disorders   . CHF (congestive heart failure) ([place])   . Chronic pain    neck pain, headache, neuropathy  . COPD (chronic obstructive pulmonary  disease) ([place])    SEES ONLY DR. PATEL -   . Depression    "when my husband passed in 2013"  . Facial trauma 05/2018  . Facial trauma, initial encounter 06/18/2018  . Fall 04/19/2018  . Gastric ulcer   . GERD (gastroesophageal reflux disease)   . Hemorrhagic gastritis   . History of kidney stones   . Hypertension   . Hypertriglyceridemia   . IDA (iron deficiency anemia)   . Internal hemorrhoids   . Intracranial aneurysm   . Migraine    "monthly" (09/08/2015)  . Neuromuscular disorder (HCC)    neuropathy  . Puncture wound of foot, right 05/17/2012   Tetanus shot 3 yrs ago at [facility] in [name], per pt report   . Stroke ([place])    TIAS    IN [place]   4 YRS AGO   . Type II diabetes mellitus ([place]) 2010   diagnosed around 2010, only ever on metformin   Temp (!) 97.5 F (36.4 C)   Ht 5\' 6"  (1.676 m)   Wt 190 lb (86.2 kg)   BMI 30.67 kg/m   Opioid Risk Score:   Fall Risk Score:  `1  Depression screen PHQ 2/9  Depression screen [facility] 2/9 08/13/2019 06/04/2019 03/28/2019 03/27/2019 02/14/2019 02/01/2019 11/13/2018  Decreased Interest 2 1 0 0 1 0 0  Down, Depressed, Hopeless 0 0 0 1 1 0 0  PHQ - 2 Score 2 1 0 1 2 0 0  Altered sleeping 1 3 - 0 2 - 1  Tired, decreased energy 1 3 - 3 1 - 1  Change in appetite 0 3 - 3 2 - 0  Feeling bad or failure about yourself  0 0 - 0 0 - 0  Trouble concentrating 0 1 - 0 0 - 0  Moving slowly or fidgety/restless 0 0 - 1  0 - 0  Suicidal thoughts 0 0 - 0 1 - 0  PHQ-9 Score 4 11 - 8 8 - 2  Difficult doing work/chores Somewhat difficult Somewhat difficult Not difficult at all Somewhat difficult - - Not difficult at all  Some recent data might be hidden    Review of Systems  Constitutional: Negative.   HENT: Negative.   Eyes: Negative.   Respiratory: Negative.   Cardiovascular: Negative.   Gastrointestinal: Positive for constipation and diarrhea.  Endocrine: Negative.   Genitourinary: Positive for urgency.       Incontinence  Musculoskeletal:  Positive for arthralgias, back pain, gait problem, neck pain and neck stiffness.  Allergic/Immunologic: Negative.   Neurological: Positive for numbness and headaches. Negative for seizures.       Tingling neuropathy   Psychiatric/Behavioral: Positive for dysphoric mood and sleep disturbance.  All other systems reviewed and are negative.      Objective:   Physical Exam Gen: no distress, normal appearing HEENT: oral mucosa pink and moist, NCAT Cardio: Reg rate Chest: normal effort, normal rate of breathing Abd: soft, non-distended Ext: no edema Skin: intact Neuro: AOx3.  Musculoskeletal: Antalgic gait, lower back pain bilaterally. +slump test bilaterally. Sensation diminished in bilateral feet.  Psych: pleasant, normal affect     Assessment & Plan:  Mrs. Bretschneider presents for severe pain in her bilateral shoulders, neck pain, low back pain, left knee pain, and bilateral ankles.   -Will obtain XR of her lumbar and cervical spine to assess for degree of pathology, stenosis suspected.   -Start Cymbalta 20mg  daily to assist with neuropathic pain.  -Discussed exercise and nutritional counseling.  -I will call with results of XRs as soon as obtained; patient thinks she can get these done tomorrow.   All questions answered. RCT in 1 month and I will communicate by phone with her before this.

## 2019-08-14 ENCOUNTER — Encounter: Payer: Self-pay | Admitting: Physical Medicine and Rehabilitation

## 2019-08-20 ENCOUNTER — Other Ambulatory Visit: Payer: Self-pay | Admitting: Internal Medicine

## 2019-08-20 DIAGNOSIS — E118 Type 2 diabetes mellitus with unspecified complications: Secondary | ICD-10-CM

## 2019-08-20 NOTE — Telephone Encounter (Signed)
Next appt scheduled 4/6 with PCP.

## 2019-08-26 ENCOUNTER — Other Ambulatory Visit: Payer: Self-pay | Admitting: Internal Medicine

## 2019-08-26 DIAGNOSIS — F322 Major depressive disorder, single episode, severe without psychotic features: Secondary | ICD-10-CM

## 2019-08-29 ENCOUNTER — Ambulatory Visit (INDEPENDENT_AMBULATORY_CARE_PROVIDER_SITE_OTHER): Payer: Medicare Other | Admitting: *Deleted

## 2019-08-29 DIAGNOSIS — R55 Syncope and collapse: Secondary | ICD-10-CM

## 2019-08-29 LAB — CUP PACEART REMOTE DEVICE CHECK
Date Time Interrogation Session: 20210317230550
Implantable Pulse Generator Implant Date: 20201210

## 2019-08-29 NOTE — Progress Notes (Signed)
ILR Remote 

## 2019-09-16 ENCOUNTER — Ambulatory Visit: Payer: Medicare Other | Admitting: Physical Medicine and Rehabilitation

## 2019-09-17 ENCOUNTER — Other Ambulatory Visit: Payer: Self-pay

## 2019-09-17 ENCOUNTER — Ambulatory Visit (HOSPITAL_COMMUNITY)
Admission: RE | Admit: 2019-09-17 | Discharge: 2019-09-17 | Disposition: A | Discharge status: Home / Self Care | Payer: Medicare Other | Source: Ambulatory Visit | Attending: Physical Medicine and Rehabilitation | Admitting: Physical Medicine and Rehabilitation

## 2019-09-17 ENCOUNTER — Encounter: Payer: Self-pay | Admitting: Internal Medicine

## 2019-09-17 ENCOUNTER — Ambulatory Visit (INDEPENDENT_AMBULATORY_CARE_PROVIDER_SITE_OTHER): Payer: Medicare Other | Admitting: Internal Medicine

## 2019-09-17 VITALS — BP 128/75 | HR 84 | Wt 187.0 lb

## 2019-09-17 DIAGNOSIS — F329 Major depressive disorder, single episode, unspecified: Secondary | ICD-10-CM | POA: Diagnosis not present

## 2019-09-17 DIAGNOSIS — G8929 Other chronic pain: Secondary | ICD-10-CM

## 2019-09-17 DIAGNOSIS — Z79899 Other long term (current) drug therapy: Secondary | ICD-10-CM | POA: Diagnosis not present

## 2019-09-17 DIAGNOSIS — J309 Allergic rhinitis, unspecified: Secondary | ICD-10-CM | POA: Diagnosis not present

## 2019-09-17 DIAGNOSIS — Z7982 Long term (current) use of aspirin: Secondary | ICD-10-CM

## 2019-09-17 DIAGNOSIS — Z720 Tobacco use: Secondary | ICD-10-CM | POA: Diagnosis not present

## 2019-09-17 DIAGNOSIS — J302 Other seasonal allergic rhinitis: Secondary | ICD-10-CM

## 2019-09-17 DIAGNOSIS — E118 Type 2 diabetes mellitus with unspecified complications: Secondary | ICD-10-CM | POA: Diagnosis not present

## 2019-09-17 DIAGNOSIS — Z7984 Long term (current) use of oral hypoglycemic drugs: Secondary | ICD-10-CM | POA: Diagnosis not present

## 2019-09-17 DIAGNOSIS — M542 Cervicalgia: Secondary | ICD-10-CM

## 2019-09-17 DIAGNOSIS — M5441 Lumbago with sciatica, right side: Secondary | ICD-10-CM | POA: Insufficient documentation

## 2019-09-17 DIAGNOSIS — E119 Type 2 diabetes mellitus without complications: Secondary | ICD-10-CM

## 2019-09-17 DIAGNOSIS — M549 Dorsalgia, unspecified: Secondary | ICD-10-CM | POA: Diagnosis not present

## 2019-09-17 DIAGNOSIS — F322 Major depressive disorder, single episode, severe without psychotic features: Secondary | ICD-10-CM

## 2019-09-17 LAB — POCT GLYCOSYLATED HEMOGLOBIN (HGB A1C): Hemoglobin A1C: 10.9 % — AB (ref 4.0–5.6)

## 2019-09-17 LAB — GLUCOSE, CAPILLARY: Glucose-Capillary: 286 mg/dL — ABNORMAL HIGH (ref 70–99)

## 2019-09-17 MED ORDER — SERTRALINE HCL 100 MG PO TABS: ORAL_TABLET | ORAL | 0 refills | Status: AC

## 2019-09-17 MED ORDER — CANAGLIFLOZIN 100 MG PO TABS: 300.0000 mg | ORAL_TABLET | Freq: Every day | ORAL | 1 refills | Status: DC

## 2019-09-17 MED ORDER — RYBELSUS 7 MG PO TABS: 7.0000 mg | ORAL_TABLET | Freq: Every day | ORAL | 2 refills | Status: DC

## 2019-09-17 MED ORDER — METFORMIN HCL ER (MOD) 1000 MG PO TB24: 1000.0000 mg | ORAL_TABLET | Freq: Two times a day (BID) | ORAL | 1 refills | Status: DC

## 2019-09-17 NOTE — Patient Instructions (Addendum)
To [name],   It was a pleasure working with you today. Today we discussed your blood pressure and diabetes. Please continue taking your blood pressure medications as instructed. For your diabetes medications we will start you on a trial dose rybelsus 3 mg. This medication will replace your Januvia. Please complete the trial dose before taking the 7 mg that will be ordered through your pharmacy. Please come back with your glucose meter in 3 months time. Have a good day.  Sincerely,  Maudie Mercury, MD

## 2019-09-18 ENCOUNTER — Ambulatory Visit: Payer: Medicare Other | Admitting: Physical Medicine and Rehabilitation

## 2019-09-18 NOTE — Assessment & Plan Note (Signed)
Patient presenting today for refills on her zoloft. She states that her medication is helping her depression. She states that she is doing well.  P:  - Refill Zoloft 100 mg QD

## 2019-09-18 NOTE — Assessment & Plan Note (Signed)
[name] presents to the [facility] for a follow up on her Type II diabetes. Her current regimen consists of invokana 300 mg qd, metformin 1000 mg BID, and Januvia 50 mg. She did not bring her glucose monitor to today's visit. She states that she is adherent to her medications, but does need refills on her metformin and invokana. Her A1c is 10.9. She states that it is likely due to "late night snacking" due to stressors in her home. Today we discussed starting a trial of semaglutide, and discontinuing her Januvia. We discussed adverse effects of the medication. Patient is agreeable to the plan.  Plan:  - D/C Januvia - 20 day supply of samples provided for 3mg  of Semaglutide. Patient instructed to finish whole course before moving to 7mg  semaglutide. Patient voiced understanding.  - Metformin 1000 mg BID - Invokana 300 mg QD

## 2019-09-18 NOTE — Progress Notes (Signed)
   CC: Diabetes  HPI:  [name] is a 69 y.o., with a PMH noted below, who presents to the [facility] for a follow up on her diabetes. Please see the attached assessment and plan note under the encounter tab.   Past Medical History:  Diagnosis Date  . Acute blood loss anemia 09/27/2018  . Allergy   . Anemia   . Arthritis    "left knee" (09/08/2015)  . Asthma   . Blood transfusion without reported diagnosis 10/2017   anemic  . Brachial plexus disorders   . CHF (congestive heart failure) ([place])   . Chronic pain    neck pain, headache, neuropathy  . COPD (chronic obstructive pulmonary disease) ([place])    SEES ONLY DR. PATEL -   . Depression    "when my husband passed in 2013"  . Facial trauma 05/2018  . Facial trauma, initial encounter 06/18/2018  . Fall 04/19/2018  . Gastric ulcer   . GERD (gastroesophageal reflux disease)   . Hemorrhagic gastritis   . History of kidney stones   . Hypertension   . Hypertriglyceridemia   . IDA (iron deficiency anemia)   . Internal hemorrhoids   . Intracranial aneurysm   . Migraine    "monthly" (09/08/2015)  . Neuromuscular disorder (HCC)    neuropathy  . Puncture wound of foot, right 05/17/2012   Tetanus shot 3 yrs ago at [facility] in [place], per pt report   . Stroke ([place])    TIAS    IN [place]   4 YRS AGO   . Type II diabetes mellitus ([place]) 2010   diagnosed around 2010, only ever on metformin   Review of Systems:   Review of Systems  Constitutional: Negative for chills and malaise/fatigue.  Eyes: Negative for blurred vision and double vision.  Cardiovascular: Negative for chest pain and palpitations.  Gastrointestinal: Negative for abdominal pain, blood in stool, constipation, diarrhea, nausea and vomiting.  Neurological: Negative for dizziness and headaches.    Physical Exam:  Vitals:   09/17/19 1314  BP: 128/75  Pulse: 84  SpO2: 99%  Weight: 187 lb (84.8 kg)   Physical Exam Vitals reviewed.  Constitutional:    General: She is not in acute distress.    Appearance: Normal appearance. She is not ill-appearing or toxic-appearing.  HENT:     Head: Normocephalic and atraumatic.  Cardiovascular:     Rate and Rhythm: Normal rate and regular rhythm.     Pulses: Normal pulses.     Heart sounds: Normal heart sounds. No murmur. No friction rub. No gallop.   Pulmonary:     Effort: Pulmonary effort is normal.     Breath sounds: Normal breath sounds. No wheezing, rhonchi or rales.  Abdominal:     General: Abdomen is flat. Bowel sounds are normal.     Palpations: Abdomen is soft.     Tenderness: There is no abdominal tenderness. There is no guarding.  Musculoskeletal:        General: No swelling.     Right lower leg: No edema.     Left lower leg: No edema.  Neurological:     Mental Status: She is alert.  Psychiatric:        Mood and Affect: Mood normal.        Behavior: Behavior normal.     Assessment & Plan:   See Encounters Tab for problem based charting.  Patient seen with Dr. Heber Socorro

## 2019-09-20 ENCOUNTER — Telehealth: Payer: Self-pay | Admitting: Internal Medicine

## 2019-09-20 ENCOUNTER — Ambulatory Visit: Payer: Medicare Other | Admitting: Physical Medicine and Rehabilitation

## 2019-09-20 MED ORDER — FLUTICASONE PROPIONATE 50 MCG/ACT NA SUSP: 2.0000 | Freq: Every day | NASAL | 2 refills | Status: DC

## 2019-09-20 NOTE — Addendum Note (Signed)
Addended by: Maudie Mercury C on: 09/20/2019 12:36 PM   Modules accepted: Orders

## 2019-09-20 NOTE — Telephone Encounter (Signed)
Pt saw Dr. Gilford Rile in [facility] on 09/17/19, will forward request. SChaplin, RN,BSN

## 2019-09-20 NOTE — Telephone Encounter (Signed)
Medication has been prescribed.

## 2019-09-20 NOTE — Telephone Encounter (Signed)
Per pt dr was suppose to order fluonase, wasn't at pharmacy please send   [place], [place]

## 2019-09-20 NOTE — Assessment & Plan Note (Signed)
Patient presents to the clinic with worsening rhinitis and itching, teary eyes. States that since the spring symptoms she has had increased symptoms. She states that she takes zyrtec at home with no relief.  Plan:  -Flonase

## 2019-09-24 ENCOUNTER — Encounter: Payer: Medicare Other | Admitting: Physical Medicine and Rehabilitation

## 2019-09-26 NOTE — Progress Notes (Signed)
Internal Medicine Clinic Attending  I saw and evaluated the patient.  I personally confirmed the key portions of the history and exam documented by Dr. Winters and I reviewed pertinent patient test results.  The assessment, diagnosis, and plan were formulated together and I agree with the documentation in the resident's note.  

## 2019-09-28 ENCOUNTER — Telehealth: Payer: Self-pay | Admitting: Internal Medicine

## 2019-09-28 NOTE — Telephone Encounter (Signed)
   Reason for call:   I received a call from [name] at 3 PM indicating she has nausea vomiting since started semaglutide.   Pertinent Data:   N/V started just after starting semaglutide. No fever or chills. Some cramp. No diarrhea. Bg ranges 199-210.   Assessment / Plan / Recommendations:   semaglutidegi side effect. Patient would like to hold semaglutide and take her Januvia. I agreed that her symptoms can be due to semaglutide. OK to hold it and go back to Januvia for next 2 days and call PCP on Monday. I explained to her that per PCP note, and last HbA1 of 10, her DM is not controlled and she should have a close follow up with PCP and take her glucometer with her. She agreed with the plan.  As always, pt is advised that if symptoms worsen or new symptoms arise, they should go to an urgent care facility or to to ER for further evaluation.   Dewayne Hatch, MD   09/28/2019, 3:35 PM

## 2019-09-29 LAB — CUP PACEART REMOTE DEVICE CHECK
Date Time Interrogation Session: 20210417230340
Implantable Pulse Generator Implant Date: 20201210

## 2019-09-30 ENCOUNTER — Ambulatory Visit (INDEPENDENT_AMBULATORY_CARE_PROVIDER_SITE_OTHER): Payer: Medicare Other | Admitting: *Deleted

## 2019-09-30 DIAGNOSIS — R55 Syncope and collapse: Secondary | ICD-10-CM

## 2019-10-01 ENCOUNTER — Encounter: Payer: Self-pay | Admitting: Physical Medicine and Rehabilitation

## 2019-10-01 ENCOUNTER — Encounter
Payer: Medicare Other | Attending: Physical Medicine and Rehabilitation | Admitting: Physical Medicine and Rehabilitation

## 2019-10-01 ENCOUNTER — Other Ambulatory Visit: Payer: Self-pay

## 2019-10-01 VITALS — BP 117/74 | HR 85 | Temp 97.2°F | Ht 67.0 in | Wt 180.0 lb

## 2019-10-01 DIAGNOSIS — M542 Cervicalgia: Secondary | ICD-10-CM | POA: Diagnosis not present

## 2019-10-01 DIAGNOSIS — M5441 Lumbago with sciatica, right side: Secondary | ICD-10-CM | POA: Diagnosis not present

## 2019-10-01 DIAGNOSIS — M5137 Other intervertebral disc degeneration, lumbosacral region: Secondary | ICD-10-CM

## 2019-10-01 DIAGNOSIS — H524 Presbyopia: Secondary | ICD-10-CM | POA: Diagnosis not present

## 2019-10-01 DIAGNOSIS — H35033 Hypertensive retinopathy, bilateral: Secondary | ICD-10-CM | POA: Diagnosis not present

## 2019-10-01 DIAGNOSIS — G8929 Other chronic pain: Secondary | ICD-10-CM | POA: Diagnosis not present

## 2019-10-01 DIAGNOSIS — G4701 Insomnia due to medical condition: Secondary | ICD-10-CM

## 2019-10-01 DIAGNOSIS — H40013 Open angle with borderline findings, low risk, bilateral: Secondary | ICD-10-CM | POA: Diagnosis not present

## 2019-10-01 DIAGNOSIS — M4802 Spinal stenosis, cervical region: Secondary | ICD-10-CM | POA: Diagnosis not present

## 2019-10-01 MED ORDER — AMITRIPTYLINE HCL 10 MG PO TABS: 10.0000 mg | ORAL_TABLET | Freq: Every day | ORAL | 3 refills | Status: DC

## 2019-10-01 NOTE — Progress Notes (Signed)
ILR Remote 

## 2019-10-01 NOTE — Progress Notes (Signed)
Subjective:    Patient ID: [name], female    DOB: January 03, 1951, 69 y.o.   MRN: [id number]  HPI  [name] presents for severe pain in her bilateral shoulders, neck pain, low back pain, left knee pain, and bilateral ankles.   The pain is currently 4/10, and worst in her low back and neck. This is an improvement from last visit.   She cannot take Aspirin as she has had bleeding ulcers. She just started receiving Fioricet, which helps with the migraines. If she does not sleep correctly, she feels the pain in the morning, and she has recently been sleeping very poorly. She has been on a multitude of pain medications in the past when she was in [place].   She sometimes gets sciatic pain and peripheral neuropathy and was evaluated as a candidate for an epidural in [place] but was told her disc degeneration was too severe.  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   Last visit we tried her on Cymbalta but this provided no relief. She has not tried Amitriptyline before.   She is a great grandmother and takes care of her 49 year old great grandson, who is a great source of joy to her. She does not walk much because of her diffuse pains as well as a right foot callous for which she sees podiatry. She does try to go outside with her great grandson where she can watch him play. She tries to at least do leg and arm strengthening exercises when she is seated to maintain her strength.    Pain Inventory Average Pain 4 Pain Right Now 7 My pain is dull and aching  In the last 24 hours, has pain interfered with the following? General activity 3 Relation with others 3 Enjoyment of life 1 What TIME of day is your pain at its worst? evening , night Sleep (in general) Poor  Pain is worse with: walking, bending and standing Pain improves with: rest Relief from Meds: na  Mobility walk without assistance how many minutes can you walk? 2-3 ability to climb steps?   yes do you drive?  no transfers alone Do you have any goals in this area?  yes  Function not employed: date last employed . disabled: date disabled . I need assistance with the following:  bathing and household duties Do you have any goals in this area?  no  Neuro/Psych bladder control problems numbness tingling trouble walking  Prior Studies Any changes since last visit?  no  Physicians involved in your care Any changes since last visit?  no   Family History  Problem Relation Age of Onset  . Hyperlipidemia Mother   . Heart attack Mother   . Heart attack Father 38  . Hypertension Father   . Cancer Paternal Grandfather        Lung cancer  . Heart attack Paternal Grandfather   . Cancer Paternal Grandmother   . Obesity Daughter        gastric bypass surgery  . Arthritis Sister   . Migraines Daughter   . Chiari malformation Daughter   . Arthritis Daughter   . Adel White syndrome Daughter   . Colon cancer Neg Hx   . Colon polyps Neg Hx   . Esophageal cancer Neg Hx   . Rectal cancer Neg Hx   . Stomach cancer Neg Hx    Social History   Socioeconomic History  . Marital status:  Widowed    Spouse name: Not on file  . Number of children: Not on file  . Years of education: 56  . Highest education level: Not on file  Occupational History  . Occupation: Retired  . Occupation: Disabled  Tobacco Use  . Smoking status: Current Every Day Smoker    Packs/day: 0.30    Years: 49.00    Pack years: 14.70  . Smokeless tobacco: Never Used  . Tobacco comment: 3 cigs per day  Substance and Sexual Activity  . Alcohol use: No    Alcohol/week: 0.0 standard drinks  . Drug use: No  . Sexual activity: Not Currently  Other Topics Concern  . Not on file  Social History Narrative   Moved to [facility] from [place], shortly after the death of her husband, to live with her daughter.  She notes significant life stress related to her 69 year-old grand-daughter  with bipolar disorder.  She has previously worked as a Occupational psychologist.      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 There are 2 steps without handrails up to the entrance the patient uses.       Patient's method of transportation is via family member.      The highest level of education was some college: 2 Careers adviser (pharmacy and psych).      The patient currently Retired/disabled.      Identified important Relationships are "My daughter and granddaughter."      Pets : "2 indoor cats, 1 indoor dog, 1 outdoor dog, and a lizard in a tank."          Left handed   Interests / Fun: "Have fun with my great grandson playing around the house, watching him play outside with his imagination."       Current Stressors: "My granddaughter's fiance and my granddaughter."       Religious / Personal Beliefs: "Protestant"       L. Ducatte, BSN, RN-BC       Social Determinants of Health   Financial Resource Strain: Medium Risk  . Difficulty of Paying Living Expenses: Somewhat hard  Food Insecurity: Food Insecurity Present  . Worried About Charity fundraiser in the Last Year: Sometimes true  . Ran Out of Food in the Last Year: Sometimes true  Transportation Needs: No Transportation Needs  . Lack of Transportation (Medical): No  . Lack of Transportation (Non-Medical): No  Physical Activity: Inactive  . Days of Exercise per Week: 0 days  . Minutes of Exercise per Session: 0 min  Stress: Stress Concern Present  . Feeling of Stress : To some extent  Social Connections:   . Frequency of Communication with Friends and Family:   . Frequency of Social Gatherings with Friends and  Family:   . Attends Religious Services:   . Active Member of Clubs or Organizations:   . Attends Archivist Meetings:   Marland Kitchen Marital Status:    Past Surgical History:  Procedure Laterality Date  . AMPUTATION Right 05/01/2015   Procedure: Right Foot 1st Ray Amputation;  Surgeon: Newt Minion, MD;  Location: [facility];  Service: Orthopedics;  Laterality: Right;  . ARTHRODESIS METATARSAL     RIGHT 5 TH   . BILATERAL SALPINGOOPHORECTOMY Bilateral 1986   "after hemtoma evacuations; had to cut me open"  . BIOPSY  11/07/2017   Procedure: BIOPSY;  Surgeon: Irene Shipper, MD;  Location[facility];  Service: Endoscopy;;  . CARPAL TUNNEL RELEASE Bilateral   . COLONOSCOPY     10 + yrs ago- pt unsure- was in [place]- pt states  MD will not send records but colon was normal per pt.   . CYSTOSCOPY W/ URETERAL STENT PLACEMENT Left 09/06/2018   Procedure: CYSTOSCOPY WITH LEFT RETROGRADE PYELOGRAM/URETERAL LEFT DOUBLE J STENT PLACEMENT;  Surgeon: Franchot Gallo, MD;  Location: [facility];  Service: Urology;  Laterality: Left;  . DILATION AND CURETTAGE OF UTERUS  ~ 1982   S/P miscarriage  . ESOPHAGOGASTRODUODENOSCOPY (EGD) WITH PROPOFOL N/A 11/07/2017   Procedure: ESOPHAGOGASTRODUODENOSCOPY (EGD) WITH PROPOFOL;  Surgeon: Irene Shipper, MD;  Location: [facility] ENDOSCOPY;  Service: Endoscopy;  Laterality: N/A;  . EXTRACORPOREAL SHOCK WAVE LITHOTRIPSY Left 09/24/2018   Procedure: EXTRACORPOREAL SHOCK WAVE LITHOTRIPSY (ESWL);  Surgeon: Cleon Gustin, MD;  Location: [facility];  Service: Urology;  Laterality: Left;  . HEMATOMA EVACUATION Right 1986 x 2   "OVARY; w/in 1 wk after hysterectomy"  . implantable loop recorder placement  05/23/2019   MDT [id number]) implanted by Dr Rayann Heman in office for evaluation of syncope  . KNEE ARTHROSCOPY     LEFT  . LAPAROSCOPIC CHOLECYSTECTOMY    . SHOULDER ARTHROSCOPY W/ ROTATOR CUFF REPAIR Bilateral   . TONSILLECTOMY    . TOTAL KNEE ARTHROPLASTY Left  09/30/2016   Procedure: LEFT TOTAL KNEE ARTHROPLASTY;  Surgeon: Netta Cedars, MD;  Location: [facility];  Service: Orthopedics;  Laterality: Left;  . TUBAL LIGATION    . VAGINAL HYSTERECTOMY  1986   Past Medical History:  Diagnosis Date  . Acute blood loss anemia 09/27/2018  . Allergy   . Anemia   . Arthritis    "left knee" (09/08/2015)  . Asthma   . Blood transfusion without reported diagnosis 10/2017   anemic  . Brachial plexus disorders   . CHF (congestive heart failure) ([place])   . Chronic pain    neck pain, headache, neuropathy  . COPD (chronic obstructive pulmonary disease) ([place])    SEES ONLY DR. PATEL -   . Depression    "when my husband passed in 2013"  . Facial trauma 05/2018  . Facial trauma, initial encounter 06/18/2018  . Fall 04/19/2018  . Gastric ulcer   . GERD (gastroesophageal reflux disease)   . Hemorrhagic gastritis   . History of kidney stones   . Hypertension   . Hypertriglyceridemia   . IDA (iron deficiency anemia)   . Internal hemorrhoids   . Intracranial aneurysm   . Migraine    "monthly" (09/08/2015)  . Neuromuscular disorder (HCC)    neuropathy  . Puncture wound of foot, right 05/17/2012   Tetanus shot 3 yrs ago at [facility] in [place], per pt report   . Stroke ([place])    TIAS    IN [place]   4 YRS AGO   . Type II diabetes mellitus ([place]) 2010   diagnosed around 2010, only ever on metformin   Temp (!) 97.2 F (36.2 C)   Ht 5\' 7"  (1.702 m)   Wt 180 lb (81.6 kg)   BMI 28.19 kg/m   Opioid Risk Score:  Fall Risk Score:  `1  Depression screen PHQ 2/9  Depression screen [facility] 2/9 09/17/2019 08/13/2019 06/04/2019 03/28/2019 03/27/2019 02/14/2019 02/01/2019  Decreased Interest 0 2 1 0 0 1 0  Down, Depressed, Hopeless 0 0 0 0 1 1 0  PHQ - 2 Score 0 2 1 0 1 2 0  Altered sleeping 1 1 3  - 0 2 -  Tired, decreased energy 0 1 3 - 3 1 -  Change in appetite 1 0 3 - 3 2 -  Feeling bad or failure about yourself  - 0 0 - 0 0 -  Trouble concentrating 0 0 1 - 0 0 -    Moving slowly or fidgety/restless 0 0 0 - 1 0 -  Suicidal thoughts 0 0 0 - 0 1 -  PHQ-9 Score 2 4 11  - 8 8 -  Difficult doing work/chores - Somewhat difficult Somewhat difficult Not difficult at all Somewhat difficult - -  Some recent data might be hidden    Review of Systems  Constitutional: Positive for appetite change.  HENT: Negative.   Eyes: Negative.   Respiratory: Negative.   Cardiovascular: Negative.   Gastrointestinal: Negative.   Endocrine: Negative.   Genitourinary: Negative.   Musculoskeletal: Positive for arthralgias and back pain.  Skin: Negative.   Allergic/Immunologic: Negative.   Neurological: Positive for numbness.       Tingling   Hematological: Negative.   Psychiatric/Behavioral: Negative.   All other systems reviewed and are negative.      Objective:   Physical Exam  Gen: no distress, normal appearing HEENT: oral mucosa pink and moist, NCAT Cardio: Reg rate Chest: normal effort, normal rate of breathing Abd: soft, non-distended Ext: no edema Skin: intact Neuro: AOx3.  Musculoskeletal: Antalgic gait, lower back pain bilaterally. +slump test bilaterally. Sensation diminished in bilateral feet.  Psych: pleasant, normal affect, brightens when talking about her great grandson.        Assessment & Plan:  [name] presents for severe pain in her bilateral shoulders, neck pain, low back pain, left knee pain, and bilateral ankles.   -Reviewed results of lumbar and cervical XRs with her: they show cervical stenosis and lumbar disc degeneration.   -Cymbalta did not provide relief. Start Amitriptyline 10mg  HS to help with both neuropathic pain and insomnia.   -Discussed exercise and nutritional counseling, including exercises she can do while caring for her great grandson.   -Could consider cervical ESI in future.   All questions answered. RCT in 1 month.

## 2019-10-02 ENCOUNTER — Ambulatory Visit (INDEPENDENT_AMBULATORY_CARE_PROVIDER_SITE_OTHER): Payer: Medicare Other | Admitting: Internal Medicine

## 2019-10-02 ENCOUNTER — Telehealth: Payer: Self-pay | Admitting: Internal Medicine

## 2019-10-02 DIAGNOSIS — Z7982 Long term (current) use of aspirin: Secondary | ICD-10-CM | POA: Diagnosis not present

## 2019-10-02 DIAGNOSIS — Z720 Tobacco use: Secondary | ICD-10-CM | POA: Diagnosis not present

## 2019-10-02 DIAGNOSIS — E118 Type 2 diabetes mellitus with unspecified complications: Secondary | ICD-10-CM | POA: Diagnosis not present

## 2019-10-02 DIAGNOSIS — Z794 Long term (current) use of insulin: Secondary | ICD-10-CM | POA: Diagnosis not present

## 2019-10-02 MED ORDER — INVOKAMET XR 150-500 MG PO TB24: 2.0000 | ORAL_TABLET | Freq: Every day | ORAL | 2 refills | Status: DC

## 2019-10-02 NOTE — Patient Instructions (Addendum)
[name],  It was a pleasure meeting you today! Today we discussed your diabetes medications. You can continue your Tong[place]. You will be started on Tresiba today. Please inject 20 units daily. We will also combine your metformin and Invokana into Invokamet XR please take the medication as directed. Have a good day!  Sincerely,  Maudie Mercury, MD

## 2019-10-02 NOTE — Telephone Encounter (Signed)
TC to patient, notified of Dr. Jackson Latino recommendation and appt made for 1015 in [facility] today. SChaplin, RN,BSN

## 2019-10-02 NOTE — Telephone Encounter (Signed)
Pls contact pt regarding medication reactions [phone]

## 2019-10-02 NOTE — Telephone Encounter (Signed)
RTC, pt saw Dr. Gilford Rile in [facility] on 09/17/19 and was told to stop Januvia and start Rybelsus.  She was given Rybelsus 3mg  samples to start on and see how she did before she went on a maintenance dose of Rybelsus 7mg  daily. Pt states she has taken Rybelsus 3mg  daily now for 1 week and c/o severe H/A with N/V, she has vomitted 3X out of the last week. She has not restarted her Januvia. She is also taking Metformin 1000mg  BID And Invokana 300mg  daily And reports her BS's are not well controlled and reports her BS this a.m. was 363  Will forward to Dr. Gilford Rile who saw pt on 09/17/19 to advise. SChaplin, RN,BSN

## 2019-10-02 NOTE — Telephone Encounter (Signed)
If possible, have patient make an [facility] appointment today for reevaluation. N/V are common side effects of the medication, but H/A is concerning. I would like to evaluate to ensure this is a medication side effect.

## 2019-10-02 NOTE — Progress Notes (Signed)
CC: Diabetes Medication Side Effect  HPI:  [name] is a 69 y.o. female, with a PMH noted below, who presents to the clinic for a medication reaction. Please see the A&P note attached under the encounters tab.   Past Medical History:  Diagnosis Date  . Acute blood loss anemia 09/27/2018  . Allergy   . Anemia   . Arthritis    "left knee" (09/08/2015)  . Asthma   . Blood transfusion without reported diagnosis 10/2017   anemic  . Brachial plexus disorders   . CHF (congestive heart failure) ([place])   . Chronic pain    neck pain, headache, neuropathy  . COPD (chronic obstructive pulmonary disease) ([place])    SEES ONLY DR. PATEL -   . Depression    "when my husband passed in 2013"  . Facial trauma 05/2018  . Facial trauma, initial encounter 06/18/2018  . Fall 04/19/2018  . Gastric ulcer   . GERD (gastroesophageal reflux disease)   . Hemorrhagic gastritis   . History of kidney stones   . Hypertension   . Hypertriglyceridemia   . IDA (iron deficiency anemia)   . Internal hemorrhoids   . Intracranial aneurysm   . Migraine    "monthly" (09/08/2015)  . Neuromuscular disorder (HCC)    neuropathy  . Puncture wound of foot, right 05/17/2012   Tetanus shot 3 yrs ago at [facility] in [place], per pt report   . Stroke ([place])    TIAS    IN [place]   4 YRS AGO   . Type II diabetes mellitus ([place]) 2010   diagnosed around 2010, only ever on metformin   Review of Systems:   Review of Systems  Constitutional: Negative for chills, diaphoresis, fever, malaise/fatigue and weight loss.  Respiratory: Negative for cough and shortness of breath.   Cardiovascular: Negative for chest pain and palpitations.  Gastrointestinal: Positive for nausea and vomiting. Negative for abdominal pain, constipation and diarrhea.  Neurological: Positive for headaches. Negative for dizziness, tingling and tremors.    Physical Exam:  Vitals:   10/02/19 1029  BP: (!) 136/58  Pulse: 77  SpO2: 97%    Weight: 186 lb 1.6 oz (84.4 kg)  Height: 5\' 7"  (1.702 m)   Physical Exam Vitals and nursing note reviewed.  Constitutional:      General: She is not in acute distress.    Appearance: Normal appearance. She is not ill-appearing or toxic-appearing.  HENT:     Head: Normocephalic and atraumatic.  Eyes:     General:        Right eye: No discharge.        Left eye: No discharge.     Conjunctiva/sclera: Conjunctivae normal.  Cardiovascular:     Rate and Rhythm: Normal rate and regular rhythm.     Pulses: Normal pulses.     Heart sounds: Normal heart sounds. No murmur. No friction rub. No gallop.   Pulmonary:     Effort: Pulmonary effort is normal.     Breath sounds: Normal breath sounds. No wheezing, rhonchi or rales.  Abdominal:     General: Bowel sounds are normal.     Palpations: Abdomen is soft.     Tenderness: There is no abdominal tenderness. There is no guarding.  Musculoskeletal:        General: No swelling.     Right lower leg: No edema.     Left lower leg: No edema.  Neurological:     General: No  focal deficit present.     Mental Status: She is alert and oriented to person, place, and time.  Psychiatric:        Mood and Affect: Mood normal.        Behavior: Behavior normal.     Assessment & Plan:   See Encounters Tab for problem based charting.  Patient discussed with Dr. Heber Oakwood

## 2019-10-03 ENCOUNTER — Encounter: Payer: Self-pay | Admitting: Internal Medicine

## 2019-10-03 ENCOUNTER — Telehealth: Payer: Self-pay | Admitting: Internal Medicine

## 2019-10-03 ENCOUNTER — Encounter: Payer: Self-pay | Admitting: *Deleted

## 2019-10-03 MED ORDER — TRESIBA FLEXTOUCH 100 UNIT/ML ~~LOC~~ SOPN: 20.0000 [IU] | PEN_INJECTOR | Freq: Every day | SUBCUTANEOUS | 3 refills | Status: DC

## 2019-10-03 MED ORDER — NOVOFINE PLUS 32G X 4 MM MISC: 1.0000 [IU] | Freq: Every morning | 3 refills | Status: AC

## 2019-10-03 NOTE — Assessment & Plan Note (Addendum)
Patient arrives to the office today with complaints of side effects from Rybelsus. Patient states that she took the medication for 6 days, but stopped because she would have violent, bilious vomiting episodes, with tension headaches. Patient denies  physical examination is unremarkable, symptoms stopped when patient discontinued her medication. Patient is hesitant to start injectable medications, but is willing to try a course of Tresiba 100.  Plan:  - Patient educated on Tresiba usage - Sample Tresiba 100 given - D/C Rybelsus - Invokamet XR 150-500 2 tabs QD prescribed - invokana and metformin d/c - Tresiba 100 and supplies ordered

## 2019-10-03 NOTE — Telephone Encounter (Signed)
Called [name]; she reported that fasting today her blood sugars were 360-370 ~9am. Last a1c was 10.6% A( ~ 255 mg/dl on average)  Took her shot of Tyler Aas has not eaten anything today, has drank 3- 20 glasses of water. Checked her blood sugar ~ 12 PM and it was  586, another a few minutes later was 499. She checked after washing her hands now while we are on telephone with result of 471.  She denies symptoms ( fever, headache, cough, sores or other signs on infection)  other than constant urinating;  urine very light yellow, keeps portable toilet next to her and has trouble getting on it in time without missing.  She states she has ongoing poor appetite, drinking water, has had 3 oz juice in past week to flavor her water, no regular soda, sports drinks, only diet soda.  Insulin naive, watched video to learn how to use pen yesterday. She says it went back to zero. She did not due safety test. Did not see anything come out of pen needle tip. I educated her over the phone about doing a safety test.    She took her metformin and invokana this am; will restart her Tong[place] this evening with her second dose of metformin . Unsure if she got her full dose of insulin today. Educated her that Tyler Aas is a very long and slow acting insulin that should show some improvement in blood sugars by tomorrow.   Plan to route note to Dr. Maricela Bo, patient to drink lot's of water, eat low carb foods and continue medicines as instructed. Will route to [facility] Attending and Dr Maricela Bo  And call her tomorrow for an update.

## 2019-10-03 NOTE — Telephone Encounter (Signed)
Pt calls and states she started trulicity this am -at [id number] and checked cbg 1205 was 500+. She feels fine but is concerned. Nurse attempted to inform pt that her body is reacting to a change in medication. She just rechecked and cbg is 499.  Asking donnap. To call pt

## 2019-10-03 NOTE — Telephone Encounter (Signed)
Please contact pt regarding new medicine [phone]

## 2019-10-04 NOTE — Progress Notes (Signed)
Internal Medicine Clinic Attending  I saw and evaluated the patient.  I personally confirmed the key portions of the history and exam documented by Dr. Gilford Rile and I reviewed pertinent patient test results.  The assessment, diagnosis, and plan were formulated together and I agree with the documentation in the resident's note.    To clarify her starting dose of tresiba is 20units.

## 2019-10-04 NOTE — Telephone Encounter (Signed)
Thank you for letting me know [name].When you follow up with her today can you please check what time she is takign the insulin and what her blood sugars have been throughout the day please. We can tailor the dosing accordingly.

## 2019-10-04 NOTE — Telephone Encounter (Signed)
Tried calling patient. There was no answer or voicemail to leave a message. Will try patient again on Monday.

## 2019-10-05 IMAGING — DX DG CHEST 1V PORT
1 series · 1 of 1 positions shown · non-contrast
Comparison: 06/06/2017

CLINICAL DATA: Syncope.

EXAM:
PORTABLE CHEST 1 VIEW

[chest ap]
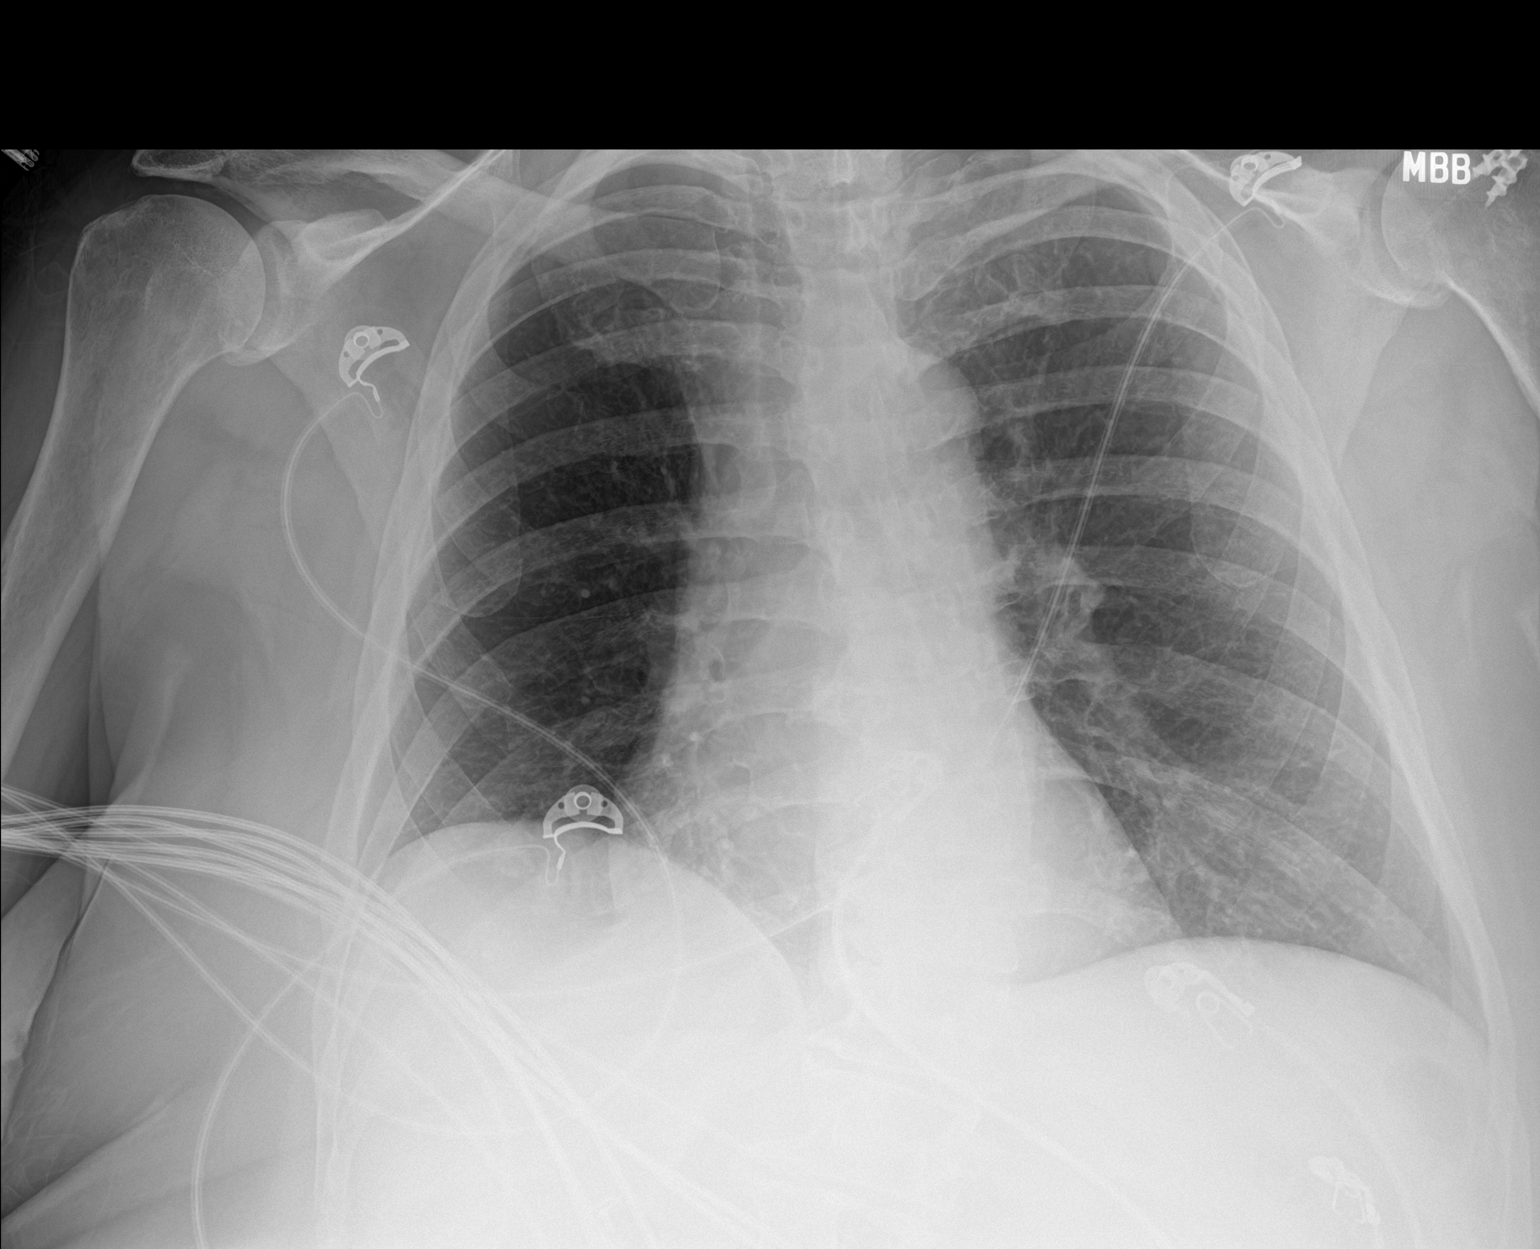

[1 of 1 positions shown; findings below may reference images not displayed]

FINDINGS: Mild right hemidiaphragm elevation. Patient rotated right. Midline
trachea. Normal heart size. Atherosclerosis in the transverse aorta.
No pleural effusion or pneumothorax. Clear lungs.
IMPRESSION: No acute cardiopulmonary disease.

Aortic and branch vessel atherosclerosis.

## 2019-10-09 NOTE — Telephone Encounter (Signed)
Phone rings with no voicemail. Left family member HIPPA compliant message to give to patient for return call.

## 2019-10-10 NOTE — Telephone Encounter (Addendum)
Ms. Steere returned call: she statews her blood sugars are back and forth, but better- the lowest has been 174, the highest 436 last night after [employer] eating binge- ate 3/4 of a big party nachos.  Insulin going good. Feeling okay Confusion at  Pharmacy, but she has straighteedn ity out an dis taking:  invokanamet and [place] and [place] 20 units ( I think in the morning)  a day.  Has a follow up in June. She wants to keep working on her blood sugars for a few more weeks and will call us if she is not satisfied with them.

## 2019-10-14 ENCOUNTER — Encounter: Payer: Self-pay | Admitting: Podiatry

## 2019-10-14 ENCOUNTER — Ambulatory Visit (INDEPENDENT_AMBULATORY_CARE_PROVIDER_SITE_OTHER): Payer: Medicare Other | Admitting: Podiatry

## 2019-10-14 ENCOUNTER — Other Ambulatory Visit: Payer: Self-pay

## 2019-10-14 VITALS — Temp 97.9°F

## 2019-10-14 DIAGNOSIS — L84 Corns and callosities: Secondary | ICD-10-CM | POA: Diagnosis not present

## 2019-10-14 DIAGNOSIS — Q828 Other specified congenital malformations of skin: Secondary | ICD-10-CM | POA: Diagnosis not present

## 2019-10-14 DIAGNOSIS — E1142 Type 2 diabetes mellitus with diabetic polyneuropathy: Secondary | ICD-10-CM

## 2019-10-14 DIAGNOSIS — B351 Tinea unguium: Secondary | ICD-10-CM | POA: Diagnosis not present

## 2019-10-14 NOTE — Patient Instructions (Signed)
Diabetes Mellitus and Foot Care Foot care is an important part of your health, especially when you have diabetes. Diabetes may cause you to have problems because of poor blood flow (circulation) to your feet and legs, which can cause your skin to:  Become thinner and drier.  Break more easily.  Heal more slowly.  Peel and crack. You may also have nerve damage (neuropathy) in your legs and feet, causing decreased feeling in them. This means that you may not notice minor injuries to your feet that could lead to more serious problems. Noticing and addressing any potential problems early is the best way to prevent future foot problems. How to care for your feet Foot hygiene  Wash your feet daily with warm water and mild soap. Do not use hot water. Then, pat your feet and the areas between your toes until they are completely dry. Do not soak your feet as this can dry your skin.  Trim your toenails straight across. Do not dig under them or around the cuticle. File the edges of your nails with an emery board or nail file.  Apply a moisturizing lotion or petroleum jelly to the skin on your feet and to dry, brittle toenails. Use lotion that does not contain alcohol and is unscented. Do not apply lotion between your toes. Shoes and socks  Wear clean socks or stockings every day. Make sure they are not too tight. Do not wear knee-high stockings since they may decrease blood flow to your legs.  Wear shoes that fit properly and have enough cushioning. Always look in your shoes before you put them on to be sure there are no objects inside.  To break in new shoes, wear them for just a few hours a day. This prevents injuries on your feet. Wounds, scrapes, corns, and calluses  Check your feet daily for blisters, cuts, bruises, sores, and redness. If you cannot see the bottom of your feet, use a mirror or ask someone for help.  Do not cut corns or calluses or try to remove them with medicine.  If you  find a minor scrape, cut, or break in the skin on your feet, keep it and the skin around it clean and dry. You may clean these areas with mild soap and water. Do not clean the area with peroxide, alcohol, or iodine.  If you have a wound, scrape, corn, or callus on your foot, look at it several times a day to make sure it is healing and not infected. Check for: ? Redness, swelling, or pain. ? Fluid or blood. ? Warmth. ? Pus or a bad smell. General instructions  Do not cross your legs. This may decrease blood flow to your feet.  Do not use heating pads or hot water bottles on your feet. They may burn your skin. If you have lost feeling in your feet or legs, you may not know this is happening until it is too late.  Protect your feet from hot and cold by wearing shoes, such as at the beach or on hot pavement.  Schedule a complete foot exam at least once a year (annually) or more often if you have foot problems. If you have foot problems, report any cuts, sores, or bruises to your health care provider immediately. Contact a health care provider if:  You have a medical condition that increases your risk of infection and you have any cuts, sores, or bruises on your feet.  You have an injury that is not   healing.  You have redness on your legs or feet.  You feel burning or tingling in your legs or feet.  You have pain or cramps in your legs and feet.  Your legs or feet are numb.  Your feet always feel cold.  You have pain around a toenail. Get help right away if:  You have a wound, scrape, corn, or callus on your foot and: ? You have pain, swelling, or redness that gets worse. ? You have fluid or blood coming from the wound, scrape, corn, or callus. ? Your wound, scrape, corn, or callus feels warm to the touch. ? You have pus or a bad smell coming from the wound, scrape, corn, or callus. ? You have a fever. ? You have a red line going up your leg. Summary  Check your feet every day  for cuts, sores, red spots, swelling, and blisters.  Moisturize feet and legs daily.  Wear shoes that fit properly and have enough cushioning.  If you have foot problems, report any cuts, sores, or bruises to your health care provider immediately.  Schedule a complete foot exam at least once a year (annually) or more often if you have foot problems. This information is not intended to replace advice given to you by your health care provider. Make sure you discuss any questions you have with your health care provider. Document Revised: 02/20/2019 Document Reviewed: 07/01/2016 Elsevier Patient Education  2020 [employer]  Corns and Calluses Corns are small areas of thickened skin that occur on the top, sides, or tip of a toe. They contain a cone-shaped core with a point that can press on a nerve below. This causes pain.  Calluses are areas of thickened skin that can occur anywhere on the body, including the hands, fingers, palms, soles of the feet, and heels. Calluses are usually larger than corns. What are the causes? Corns and calluses are caused by rubbing (friction) or pressure, such as from shoes that are too tight or do not fit properly. What increases the risk? Corns are more likely to develop in people who have misshapen toes (toe deformities), such as hammer toes. Calluses can occur with friction to any area of the skin. They are more likely to develop in people who:  Work with their hands.  Wear shoes that fit poorly, are too tight, or are high-heeled.  Have toe deformities. What are the signs or symptoms? Symptoms of a corn or callus include:  A hard growth on the skin.  Pain or tenderness under the skin.  Redness and swelling.  Increased discomfort while wearing tight-fitting shoes, if your feet are affected. If a corn or callus becomes infected, symptoms may include:  Redness and swelling that gets worse.  Pain.  Fluid, blood, or pus draining from the corn or  callus. How is this diagnosed? Corns and calluses may be diagnosed based on your symptoms, your medical history, and a physical exam. How is this treated? Treatment for corns and calluses may include:  Removing the cause of the friction or pressure. This may involve: ? Changing your shoes. ? Wearing shoe inserts (orthotics) or other protective layers in your shoes, such as a corn pad. ? Wearing gloves.  Applying medicine to the skin (topical medicine) to help soften skin in the hardened, thickened areas.  Removing layers of dead skin with a file to reduce the size of the corn or callus.  Removing the corn or callus with a scalpel or laser.  Taking   antibiotic medicines, if your corn or callus is infected.  Having surgery, if a toe deformity is the cause. Follow these instructions at home:   Take over-the-counter and prescription medicines only as told by your health care provider.  If you were prescribed an antibiotic, take it as told by your health care provider. Do not stop taking it even if your condition starts to improve.  Wear shoes that fit well. Avoid wearing high-heeled shoes and shoes that are too tight or too loose.  Wear any padding, protective layers, gloves, or orthotics as told by your health care provider.  Soak your hands or feet and then use a file or pumice stone to soften your corn or callus. Do this as told by your health care provider.  Check your corn or callus every day for symptoms of infection. Contact a health care provider if you:  Notice that your symptoms do not improve with treatment.  Have redness or swelling that gets worse.  Notice that your corn or callus becomes painful.  Have fluid, blood, or pus coming from your corn or callus.  Have new symptoms. Summary  Corns are small areas of thickened skin that occur on the top, sides, or tip of a toe.  Calluses are areas of thickened skin that can occur anywhere on the body, including the  hands, fingers, palms, and soles of the feet. Calluses are usually larger than corns.  Corns and calluses are caused by rubbing (friction) or pressure, such as from shoes that are too tight or do not fit properly.  Treatment may include wearing any padding, protective layers, gloves, or orthotics as told by your health care provider. This information is not intended to replace advice given to you by your health care provider. Make sure you discuss any questions you have with your health care provider. Document Revised: 09/19/2018 Document Reviewed: 04/12/2017 Elsevier Patient Education  2020 [employer]  Peripheral Neuropathy Peripheral neuropathy is a type of nerve damage. It affects nerves that carry signals between the spinal cord and the arms, legs, and the rest of the body (peripheral nerves). It does not affect nerves in the spinal cord or brain. In peripheral neuropathy, one nerve or a group of nerves may be damaged. Peripheral neuropathy is a broad category that includes many specific nerve disorders, like diabetic neuropathy, hereditary neuropathy, and carpal tunnel syndrome. What are the causes? This condition may be caused by:  Diabetes. This is the most common cause of peripheral neuropathy.  Nerve injury.  Pressure or stress on a nerve that lasts a long time.  Lack (deficiency) of B vitamins. This can result from alcoholism, poor diet, or a restricted diet.  Infections.  Autoimmune diseases, such as rheumatoid arthritis and systemic lupus erythematosus.  Nerve diseases that are passed from parent to child (inherited).  Some medicines, such as cancer medicines (chemotherapy).  Poisonous (toxic) substances, such as lead and mercury.  Too little blood flowing to the legs.  Kidney disease.  Thyroid disease. In some cases, the cause of this condition is not known. What are the signs or symptoms? Symptoms of this condition depend on which of your nerves is damaged.  Common symptoms include:  Loss of feeling (numbness) in the feet, hands, or both.  Tingling in the feet, hands, or both.  Burning pain.  Very sensitive skin.  Weakness.  Not being able to move a part of the body (paralysis).  Muscle twitching.  Clumsiness or poor coordination.  Loss of balance.    Not being able to control your bladder.  Feeling dizzy.  Sexual problems. How is this diagnosed? Diagnosing and finding the cause of peripheral neuropathy can be difficult. Your health care provider will take your medical history and do a physical exam. A neurological exam will also be done. This involves checking things that are affected by your brain, spinal cord, and nerves (nervous system). For example, your health care provider will check your reflexes, how you move, and what you can feel. You may have other tests, such as:  Blood tests.  Electromyogram (EMG) and nerve conduction tests. These tests check nerve function and how well the nerves are controlling the muscles.  Imaging tests, such as CT scans or MRI to rule out other causes of your symptoms.  Removing a small piece of nerve to be examined in a lab (nerve biopsy). This is rare.  Removing and examining a small amount of the fluid that surrounds the brain and spinal cord (lumbar puncture). This is rare. How is this treated? Treatment for this condition may involve:  Treating the underlying cause of the neuropathy, such as diabetes, kidney disease, or vitamin deficiencies.  Stopping medicines that can cause neuropathy, such as chemotherapy.  Medicine to relieve pain. Medicines may include: ? Prescription or over-the-counter pain medicine. ? Antiseizure medicine. ? Antidepressants. ? Pain-relieving patches that are applied to painful areas of skin.  Surgery to relieve pressure on a nerve or to destroy a nerve that is causing pain.  Physical therapy to help improve movement and balance.  Devices to help you  move around (assistive devices). Follow these instructions at home: Medicines  Take over-the-counter and prescription medicines only as told by your health care provider. Do not take any other medicines without first asking your health care provider.  Do not drive or use heavy machinery while taking prescription pain medicine. Lifestyle   Do not use any products that contain nicotine or tobacco, such as cigarettes and e-cigarettes. Smoking keeps blood from reaching damaged nerves. If you need help quitting, ask your health care provider.  Avoid or limit alcohol. Too much alcohol can cause a vitamin B deficiency, and vitamin B is needed for healthy nerves.  Eat a healthy diet. This includes: ? Eating foods that are high in fiber, such as fresh fruits and vegetables, whole grains, and beans. ? Limiting foods that are high in fat and processed sugars, such as fried or sweet foods. General instructions   If you have diabetes, work closely with your health care provider to keep your blood sugar under control.  If you have numbness in your feet: ? Check every day for signs of injury or infection. Watch for redness, warmth, and swelling. ? Wear padded socks and comfortable shoes. These help protect your feet.  Develop a good support system. Living with peripheral neuropathy can be stressful. Consider talking with a mental health specialist or joining a support group.  Use assistive devices and attend physical therapy as told by your health care provider. This may include using a walker or a cane.  Keep all follow-up visits as told by your health care provider. This is important. Contact a health care provider if:  You have new signs or symptoms of peripheral neuropathy.  You are struggling emotionally from dealing with peripheral neuropathy.  Your pain is not well-controlled. Get help right away if:  You have an injury or infection that is not healing normally.  You develop new  weakness in an arm or leg.    You fall frequently. Summary  Peripheral neuropathy is when the nerves in the arms, or legs are damaged, resulting in numbness, weakness, or pain.  There are many causes of peripheral neuropathy, including diabetes, pinched nerves, vitamin deficiencies, autoimmune disease, and hereditary conditions.  Diagnosing and finding the cause of peripheral neuropathy can be difficult. Your health care provider will take your medical history, do a physical exam, and do tests, including blood tests and nerve function tests.  Treatment involves treating the underlying cause of the neuropathy and taking medicines to help control pain. Physical therapy and assistive devices may also help. This information is not intended to replace advice given to you by your health care provider. Make sure you discuss any questions you have with your health care provider. Document Revised: 05/12/2017 Document Reviewed: 08/08/2016 Elsevier Patient Education  2020 [employer]  

## 2019-10-14 NOTE — Progress Notes (Signed)
Subjective: [name] presents today for follow up of at risk foot care. 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 Pain interferes with ambulation. Aggravating factors include wearing enclosed shoe gear. Pain is relieved with periodic professional debridement.   She states her right foot is painful today. She denies any fever, chills, night sweats, nausea or vomiting.  Allergies  Allergen Reactions  . Flexeril [Cyclobenzaprine] Other (See Comments)    Prolonged QTc to 571, tachycardia  . Propofol Other (See Comments)    Hallucinations.  Manuela Neptune [Carisoprodol] Itching and Rash     Objective: Vitals:   10/14/19 1532  Temp: 97.9 F (36.6 C)    Pt is a pleasant 69 y.o. year old Caucasian female in NAD. AAO x 3.   Vascular Examination:  Capillary fill time to digits <3 seconds b/l. Palpable DP pulses b/l. Palpable PT pulses b/l. Pedal hair absent b/l Skin temperature gradient within normal limits b/l.  Dermatological Examination: Pedal skin is thin shiny, atrophic bilaterally. No open wounds bilaterally. No interdigital macerations bilaterally. Toenails L hallux, L 3rd toe, L 4th toe, L 5th toe, R 2nd toe, R 3rd toe, R 4th toe and R 5th toe elongated, dystrophic, thickened, and crumbly with subungual debris and tenderness to dorsal palpation. Hyperkeratotic lesion(s) R 2nd toe.  No erythema, no edema, no drainage, no flocculence. Porokeratotic lesion(s) submet head 5 right foot. No erythema, no edema, no drainage, no flocculence.  Musculoskeletal: Normal muscle strength 5/5 to all lower extremity muscle groups bilaterally. No pain crepitus or joint limitation noted with ROM b/l. digital amputation R hallux. 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  Neurological: Protective sensation diminished with 10g monofilament b/l.  Assessment: 1. Onychomycosis   2.  Porokeratosis   3. Corns   4. Diabetic peripheral neuropathy associated with type 2 diabetes mellitus ([place])    Plan: -No new findings. No new orders. -Continue diabetic foot care principles. Literature dispensed on today.  -Toenails L hallux, L 3rd toe, L 4th toe, L 5th toe, R 2nd toe, R 3rd toe, R 4th toe and R 5th toe debrided in length and girth without iatrogenic bleeding with sterile nail nipper and dremel.  -Corn(s) R 2nd toe pared utilizing sterile scalpel blade without complication or incident. Total number debrided=1. -Painful porokeratotic lesion(s) submet head 5 right foot pared and enucleated with sterile scalpel blade without incident. -Patient to continue soft, supportive shoe gear daily. -Patient to report any pedal injuries to medical professional immediately. -Patient/POA to call should there be question/concern in the interim.  Return in about 9 weeks (around 12/16/2019) for diabetic nail and callus trim.  Marzetta Board, DPM

## 2019-10-23 ENCOUNTER — Telehealth: Payer: Self-pay | Admitting: Dietician

## 2019-10-23 NOTE — Telephone Encounter (Signed)
Informed patient that she received a sample of Tresiba insulin that was recalled by the company. She states that the sample is gone and her blood sugars were not well controlled for a while on it, but they are now. She reports her blood sugars are 117-190 now. She reports is now drinking mostly water flavored with juice. She thinks the combination of Tresiba insulin and invokanamet is working well. She has follow up in 2 months and will call for any questions or concerns

## 2019-10-29 ENCOUNTER — Encounter
Payer: Medicare Other | Attending: Physical Medicine and Rehabilitation | Admitting: Physical Medicine and Rehabilitation

## 2019-10-29 ENCOUNTER — Encounter: Payer: Self-pay | Admitting: Physical Medicine and Rehabilitation

## 2019-10-29 ENCOUNTER — Other Ambulatory Visit: Payer: Self-pay

## 2019-10-29 VITALS — BP 110/73 | HR 79 | Temp 97.2°F | Ht 67.0 in | Wt 182.6 lb

## 2019-10-29 DIAGNOSIS — G4701 Insomnia due to medical condition: Secondary | ICD-10-CM

## 2019-10-29 DIAGNOSIS — M5441 Lumbago with sciatica, right side: Secondary | ICD-10-CM | POA: Diagnosis not present

## 2019-10-29 DIAGNOSIS — M4802 Spinal stenosis, cervical region: Secondary | ICD-10-CM | POA: Diagnosis not present

## 2019-10-29 DIAGNOSIS — M51379 Other intervertebral disc degeneration, lumbosacral region without mention of lumbar back pain or lower extremity pain: Secondary | ICD-10-CM

## 2019-10-29 DIAGNOSIS — M5137 Other intervertebral disc degeneration, lumbosacral region: Secondary | ICD-10-CM

## 2019-10-29 DIAGNOSIS — M542 Cervicalgia: Secondary | ICD-10-CM | POA: Insufficient documentation

## 2019-10-29 DIAGNOSIS — G8929 Other chronic pain: Secondary | ICD-10-CM | POA: Diagnosis not present

## 2019-10-29 MED ORDER — TIZANIDINE HCL 4 MG PO TABS: 4.0000 mg | ORAL_TABLET | Freq: Every evening | ORAL | 0 refills | Status: DC | PRN

## 2019-10-29 MED ORDER — AMITRIPTYLINE HCL 25 MG PO TABS: 25.0000 mg | ORAL_TABLET | Freq: Every day | ORAL | 3 refills | Status: DC

## 2019-10-29 NOTE — Progress Notes (Signed)
Subjective:    Patient ID: [name], female    DOB: December 13, 1950, 69 y.o.   MRN: [id number]  HPI  [name] presents for severe pain in her bilateral shoulders, neck pain, low back pain, left knee pain, and bilateral ankles.    The pain is currently 6/10, and worst in her low back and neck. This is an improvement from last visit. This is worse than last visit when her pain was 4/10.    She cannot take Aspirin as she has had bleeding ulcers. She just started receiving Fioricet, which helps with the migraines. If she does not sleep correctly, she feels the pain in the morning, and she has recently been sleeping very poorly. She has been on a multitude of pain medications in the past when she was in [name]. Last visit we tried Amitriptyline 10mg  and she had not side effects from this medication but it did not help with either her pain or insomnia.    She sometimes gets sciatic pain and peripheral neuropathy and was evaluated as a candidate for an epidural in [name] but was told her disc degeneration was too severe.   She used to work as a [name].    Previously we tried her on Cymbalta but this provided no relief.    She is a great grandmother and takes care of her 43 year old great grandson, who is a great source of joy to her. She does not walk much because of her diffuse pains as well as a right foot callous for which she sees podiatry. She does try to go outside with her great grandson where she can watch him play. She tries to at least do leg and arm strengthening exercises when she is seated to maintain her strength.   She has been walking with her neighbors for 10-20 minutes since last visit and has lost 4 lbs over the last month.   Pain Inventory Average Pain 6 Pain Right Now 8 My pain is constant, burning and tingling  In the last 24 hours, has pain interfered with the following? General activity 3 Relation with others 0 Enjoyment of life  0 What TIME of day is your pain at its worst? evening and night Sleep (in general) Poor  Pain is worse with: walking and standing Pain improves with: rest Relief from Meds: 1  Mobility walk without assistance use a walker how many minutes can you walk? 10-20 ability to climb steps?  yes do you drive?  no  Function Flowers I need assistance with the following:  bathing and household duties  Neuro/Psych bladder control problems  Prior Studies Any changes since last visit?  no  Physicians involved in your care Any changes since last visit?  no   Family History  Problem Relation Age of Onset  . Hyperlipidemia Mother   . Heart attack Mother   . Heart attack Father 30  . Hypertension Father   . Cancer Paternal Grandfather        Lung cancer  . Heart attack Paternal Grandfather   . Cancer Paternal Grandmother   . Obesity Daughter        gastric bypass surgery  . Arthritis Sister   . Migraines Daughter   . Chiari malformation Daughter   . Arthritis Daughter   . [place] syndrome Daughter   . Colon cancer Neg Hx   . Colon polyps Neg Hx   . Esophageal cancer Neg Hx   .  Rectal cancer Neg Hx   . Stomach cancer Neg Hx    Social History   Socioeconomic History  . Marital status: Widowed    Spouse name: Not on file  . Number of children: Not on file  . Years of education: 40  . Highest education level: Not on file  Occupational History  . Occupation: Flowers  . Occupation: Disabled  Tobacco Use  . Smoking status: Current Every Day Smoker    Packs/day: 0.30    Years: 49.00    Pack years: 14.70  . Smokeless tobacco: Never Used  . Tobacco comment: 3 cigs per day  Substance and Sexual Activity  . Alcohol use: No    Alcohol/week: 0.0 standard drinks  . Drug use: No  . Sexual activity: Not Currently  Other Topics Concern  . Not on file  Social History Narrative   [place]oved to [name] shortly after the death of her husband, to  live with her daughter.  She notes significant life stress related to her 74 year-old grand-daughter with bipolar disorder.  She has previously worked as a Occupational psychologist.      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 There are 2 steps without handrails up to the entrance the patient uses.       Patient's method of transportation is via family member.      The highest level of education was some college: 2 Careers adviser (pharmacy and psych).      The patient currently Flowers/disabled.      Identified important Relationships are "My daughter and granddaughter."      Pets : "2 indoor cats, 1 indoor dog, 1 outdoor dog, and a lizard in a tank."          Left handed   Interests / Fun: "Have fun with my great grandson playing around the house, watching him play outside with his imagination."       Current Stressors: "My granddaughter's fiance and my granddaughter."       Religious / Personal Beliefs: "Protestant"       L. [place], BSN, RN-BC       Social Determinants of Health   Financial Resource Strain: Medium Risk  . Difficulty of Paying Living Expenses: Somewhat hard  Food Insecurity: Food Insecurity Present  . Worried About Charity fundraiser in the Last Year: Sometimes true  . Ran Out of Food in the Last Year: Sometimes true  Transportation Needs: No Transportation Needs  . Lack of Transportation (Medical): No  . Lack of Transportation (Non-Medical): No  Physical Activity: Inactive  . Days of Exercise per Week: 0 days  . Minutes of Exercise per Session: 0 min  Stress: Stress Concern Present  . Feeling of Stress : To some extent  Social Connections:   . Frequency  of Communication with Friends and Family:   . Frequency of Social Gatherings with Friends and Family:   . Attends Religious Services:   . Active Member of Clubs or Organizations:   . Attends Archivist Meetings:   Marland Kitchen Marital Status:    Past Surgical  History:  Procedure Laterality Date  . AMPUTATION Right 05/01/2015   Procedure: Right Foot 1st Ray Amputation;  Surgeon: Newt Minion, MD;  Location: [facility];  Service: Orthopedics;  Laterality: Right;  . ARTHRODESIS METATARSAL     RIGHT 5 TH   . BILATERAL SALPINGOOPHORECTOMY Bilateral 1986   "after hemtoma evacuations; had to cut me open"  . BIOPSY  11/07/2017   Procedure: BIOPSY;  Surgeon: Irene Shipper, MD;  Location: [facility] ENDOSCOPY;  Service: Endoscopy;;  . CARPAL TUNNEL RELEASE Bilateral   . COLONOSCOPY     10 + yrs ago- pt unsure- was in [place]- pt states  MD will not send records but colon was normal per pt.   . CYSTOSCOPY W/ URETERAL STENT PLACEMENT Left 09/06/2018   Procedure: CYSTOSCOPY WITH LEFT RETROGRADE PYELOGRAM/URETERAL LEFT DOUBLE J STENT PLACEMENT;  Surgeon: Franchot Gallo, MD;  Location: [facility];  Service: Urology;  Laterality: Left;  . DILATION AND CURETTAGE OF UTERUS  ~ 1982   S/P miscarriage  . ESOPHAGOGASTRODUODENOSCOPY (EGD) WITH PROPOFOL N/A 11/07/2017   Procedure: ESOPHAGOGASTRODUODENOSCOPY (EGD) WITH PROPOFOL;  Surgeon: Irene Shipper, MD;  Location: [facility] ENDOSCOPY;  Service: Endoscopy;  Laterality: N/A;  . EXTRACORPOREAL SHOCK WAVE LITHOTRIPSY Left 09/24/2018   Procedure: EXTRACORPOREAL SHOCK WAVE LITHOTRIPSY (ESWL);  Surgeon: Cleon Gustin, MD;  Location: [facility];  Service: Urology;  Laterality: Left;  . HEMATOMA EVACUATION Right 1986 x 2   "OVARY; w/in 1 wk after hysterectomy"  . implantable loop recorder placement  05/23/2019   MDT [facility] [place] [id number]) implanted by Dr Rayann Heman in [place] for evaluation of syncope  . KNEE ARTHROSCOPY     LEFT  . LAPAROSCOPIC CHOLECYSTECTOMY    . SHOULDER  ARTHROSCOPY W/ ROTATOR CUFF REPAIR Bilateral   . TONSILLECTOMY    . TOTAL KNEE ARTHROPLASTY Left 09/30/2016   Procedure: LEFT TOTAL KNEE ARTHROPLASTY;  Surgeon: Netta Cedars, MD;  Location: [facility];  Service: Orthopedics;  Laterality: Left;  . TUBAL LIGATION    . VAGINAL HYSTERECTOMY  1986   Past Medical History:  Diagnosis Date  . Acute blood loss anemia 09/27/2018  . Allergy   . Anemia   . Arthritis    "left knee" (09/08/2015)  . Asthma   . Blood transfusion without reported diagnosis 10/2017   anemic  . Brachial plexus disorders   . CHF (congestive heart failure) ([place])   . Chronic pain    neck pain, headache, neuropathy  . COPD (chronic obstructive pulmonary disease) ([place])    SEES ONLY DR. PATEL -   . Depression    "when my husband passed in 2013"  . Facial trauma 05/2018  . Facial trauma, initial encounter 06/18/2018  . Fall 04/19/2018  . Gastric ulcer   . GERD (gastroesophageal reflux disease)   . Hemorrhagic gastritis   . History of kidney stones   . Hypertension   . Hypertriglyceridemia   . IDA (iron deficiency anemia)   . Internal hemorrhoids   . Intracranial aneurysm   . Migraine    "monthly" (09/08/2015)  . Neuromuscular disorder (HCC)    neuropathy  . Puncture wound of foot, right 05/17/2012   Tetanus shot 3 yrs ago at [facility] in [name], per pt report   . Stroke ([place])    TIAS    IN [place]   4 YRS AGO   . Type II diabetes mellitus ([place]) 2010   diagnosed around 2010, only ever on metformin   BP 110/73   Pulse 79  Temp (!) 97.2 F (36.2 C)   Ht 5\' 7"  (1.702 m)   Wt 182 lb 9.6 oz (82.8 kg)   SpO2 96%   BMI 28.60 kg/m   Opioid Risk Score:   Fall Risk Score:  `1  Depression screen PHQ 2/9  Depression screen [place] 2/9 10/29/2019 10/02/2019 09/17/2019 08/13/2019 06/04/2019 03/28/2019 03/27/2019  Decreased Interest 0 0 0 2 1 0 0  Down, Depressed, Hopeless 0 0 0 0 0 0 1  PHQ - 2 Score 0 0 0 2 1 0 1  Altered sleeping - 1 1 1 3  - 0  Tired, decreased energy  - 0 0 1 3 - 3  Change in appetite - 1 1 0 3 - 3  Feeling bad or failure about yourself  - 0 - 0 0 - 0  Trouble concentrating - 0 0 0 1 - 0  Moving slowly or fidgety/restless - 0 0 0 0 - 1  Suicidal thoughts - 0 0 0 0 - 0  PHQ-9 Score - 2 2 4 11  - 8  Difficult doing work/chores - Not difficult at all - Somewhat difficult Somewhat difficult Not difficult at all Somewhat difficult  Some recent data might be hidden    Review of Systems  Constitutional: Negative.   HENT: Negative.   Eyes: Negative.   Respiratory: Negative.   Cardiovascular: Negative.   Gastrointestinal: Negative.   Endocrine: Negative.   Genitourinary:       Bladder control  Musculoskeletal: Negative.   Skin: Negative.   Allergic/Immunologic: Negative.   Neurological: Negative.   Hematological: Negative.   Psychiatric/Behavioral: Negative.   All other systems reviewed and are negative.      Objective:   Physical Exam Gen: no distress, normal appearing HEENT: oral mucosa pink and moist, NCAT Cardio: Reg rate Chest: normal effort, normal rate of breathing Abd: soft, non-distended Ext: no edema Skin: intact Neuro:AOx3. Musculoskeletal:Antalgic gait, lower back pain bilaterally. +slump test bilaterally. Sensation diminished in bilateral feet. Psych: pleasant, normal affect, brightens when talking about her great grandson.     Assessment & Plan:  Mrs. Costantino presents for severe pain in her bilateral shoulders, neck pain, low back pain, left knee pain, and bilateral ankles.    -Reviewed results of lumbar and cervical XRs with her: they show cervical stenosis and lumbar disc degeneration.    -Cymbalta did not provide relief. Started Amitriptyline 10mg  HS last visit to help with both neuropathic pain and insomnia. She had neither side effects nor benefits. Increase dose to 25mg  HS. Advised regarding possible synergistic effects with other antidepressants she is on.    -Discussed exercise and nutritional  counseling, including exercises she can do while caring for her great grandson.    -Could consider cervical ESI in future.    All questions answered. RCT in 1 month.

## 2019-11-04 ENCOUNTER — Ambulatory Visit (INDEPENDENT_AMBULATORY_CARE_PROVIDER_SITE_OTHER): Payer: Medicare Other | Admitting: *Deleted

## 2019-11-04 DIAGNOSIS — R55 Syncope and collapse: Secondary | ICD-10-CM

## 2019-11-04 LAB — CUP PACEART REMOTE DEVICE CHECK
Date Time Interrogation Session: 20210518230557
Implantable Pulse Generator Implant Date: 20201210

## 2019-11-05 NOTE — Progress Notes (Signed)
Carelink Summary Report / Loop Recorder 

## 2019-11-13 ENCOUNTER — Ambulatory Visit (INDEPENDENT_AMBULATORY_CARE_PROVIDER_SITE_OTHER): Payer: Medicare Other | Admitting: Internal Medicine

## 2019-11-13 ENCOUNTER — Other Ambulatory Visit: Payer: Self-pay

## 2019-11-13 VITALS — BP 124/70 | HR 85 | Temp 97.8°F | Ht 67.0 in | Wt 187.6 lb

## 2019-11-13 DIAGNOSIS — R531 Weakness: Secondary | ICD-10-CM | POA: Diagnosis not present

## 2019-11-13 DIAGNOSIS — W19XXXA Unspecified fall, initial encounter: Secondary | ICD-10-CM

## 2019-11-13 DIAGNOSIS — R296 Repeated falls: Secondary | ICD-10-CM

## 2019-11-13 NOTE — Patient Instructions (Addendum)
Thank you for visiting [place] in clinic today.  Below is a summary of what we discussed:  1.  Falls -He has significant leg weakness in your right leg which I think is contributing to your falls. -I ordered a physical therapy referral so that they can work on your strengthening your leg as well as her balance. -We checked her blood pressures while you were sitting and standing to make sure your blood pressures were not decreasing too much while you were changing positions.  Sometimes, this can cause people to fall.  Usually people feel very dizzy or faint after standing up from a seated position.  If you ever feel like this, lay down and elevate your feet above your head until that feeling goes away.  2.  Follow-up -Please schedule a follow-up appointment with your primary care provider within the next month to discuss potential medication changes that could decrease your risk of falling.  If you have any questions or concerns, please feel free to reach out

## 2019-11-13 NOTE — Assessment & Plan Note (Addendum)
HPI: Pt states that she fell twice in the past 2 weeks.  On her most recent fall, she states that she fell in her backyard while watching her great grandson play.  She denies tripping over any objects.  She also denies any prodromal symptoms of vision changes, feeling hot, dizziness or feeling faint.  She says that she fell backwards because her her leg gave out from under her.  She did not lose consciousness after she fell.  On exam, the patient has notable weakness with hip flexion and hip extension, as well as knee flexion and knee extension.. She also has gluteus weakness, she is not able to walk she also appears to have gluteal weakness when attempting to tandem walk, as she is unable to remain balance and falls to the right whenever she puts her weight on her right foot.    Plan: -Physical therapy referral ordered -Orthostatic vital signs were checked and were normal  -Follow-up with PCP within 4 weeks to review medication list.  Although the patient does not report any symptoms, she is on several centrally acting medications and polypharmacy could contribute to falls.

## 2019-11-13 NOTE — Progress Notes (Signed)
CC: Falls   HPI:  [name] is a 69 y.o. with a hx as noted below who presents today for falls.  Please refer to the problem based charting for further details.    Past Medical History:  Diagnosis Date  . Acute blood loss anemia 09/27/2018  . Allergy   . Anemia   . Arthritis    "left knee" (09/08/2015)  . Asthma   . Blood transfusion without reported diagnosis 10/2017   anemic  . Brachial plexus disorders   . CHF (congestive heart failure) ([place])   . Chronic pain    neck pain, headache, neuropathy  . COPD (chronic obstructive pulmonary disease) ([place])    SEES ONLY DR. PATEL -   . Depression    "when my husband passed in 2013"  . Facial trauma 05/2018  . Facial trauma, initial encounter 06/18/2018  . Fall 04/19/2018  . Gastric ulcer   . GERD (gastroesophageal reflux disease)   . Hemorrhagic gastritis   . History of kidney stones   . Hypertension   . Hypertriglyceridemia   . IDA (iron deficiency anemia)   . Internal hemorrhoids   . Intracranial aneurysm   . Migraine    "monthly" (09/08/2015)  . Neuromuscular disorder (HCC)    neuropathy  . Puncture wound of foot, right 05/17/2012   Tetanus shot 3 yrs ago at [facility] in [place], per pt report   . Stroke ([place])    TIAS    IN [place]   4 YRS AGO   . Type II diabetes mellitus ([place]) 2010   diagnosed around 2010, only ever on metformin   Review of Systems: All systems have been reviewed and are negative unless mentioned in the HPI  Physical Exam: Vitals:   11/13/19 0851  BP: 124/70  Pulse: 85  Temp: 97.8 F (36.6 C)  TempSrc: Oral  SpO2: 96%  Weight: 187 lb 9.6 oz (85.1 kg)  Height: 5\' 7"  (1.702 m)   Physical Exam Vitals reviewed.  Constitutional:      General: She is not in acute distress.    Appearance: Normal appearance. She is obese. She is not ill-appearing or toxic-appearing.  HENT:     Head: Normocephalic and atraumatic.  Pulmonary:     Effort: Pulmonary effort is normal.    Musculoskeletal:     Right lower leg: No edema.     Left lower leg: No edema.   Neurological: Mental Status: Patient is awake, alert, oriented x3 No signs of aphasia or neglect Cranial Nerves: II: Pupils equal, round, and reactive to light.   III,IV, VI: EOMI intact, bilateral eyes do not converge, pt reports blurred vision with convergence V: Facial sensation is symmetric to light touch and  Temperature. VII: Facial movement is symmetric.   VIII: hearing is intact to voice X: Uvula elevates symmetrically XI: Shoulder shrug is symmetric. XII: tongue is midline without atrophy or fasciculations.  Motor:  RUE: 5/5 with shoulder, elbow, and wrist flexion and extension  LUE: 5/5 with shoulder, elbow, and wrist flexion and extension RLE: 4/5 with hip, and knee flexion and extension.  5/5 ankle flexion and extension LLE: 5/5 with hip, knee, andankle flexion and extension Sensory: Diminished sensation in peripheral upper and lower extremities in a symmetric fashion Deep Tendon Reflexes:  2+ in upper and lower extremities, absent patellar reflex in left knee (knee replacement) Plantars: Toes are downgoing bilaterally Cerebellar: Finger-Nose intact bilaterally  Assessment & Plan:   See Encounters Tab for problem  based charting.  Patient discussed with Dr. Heber [place]

## 2019-11-15 IMAGING — DX DG CHEST 1V PORT
1 series · 1 of 1 positions shown · non-contrast
Comparison: Single-view of the chest 11/27/2017 and 11/06/2017. PA
and lateral chest 06/06/2017.

CLINICAL DATA: Weakness and lethargy today.

EXAM:
PORTABLE CHEST 1 VIEW

[chest ap]
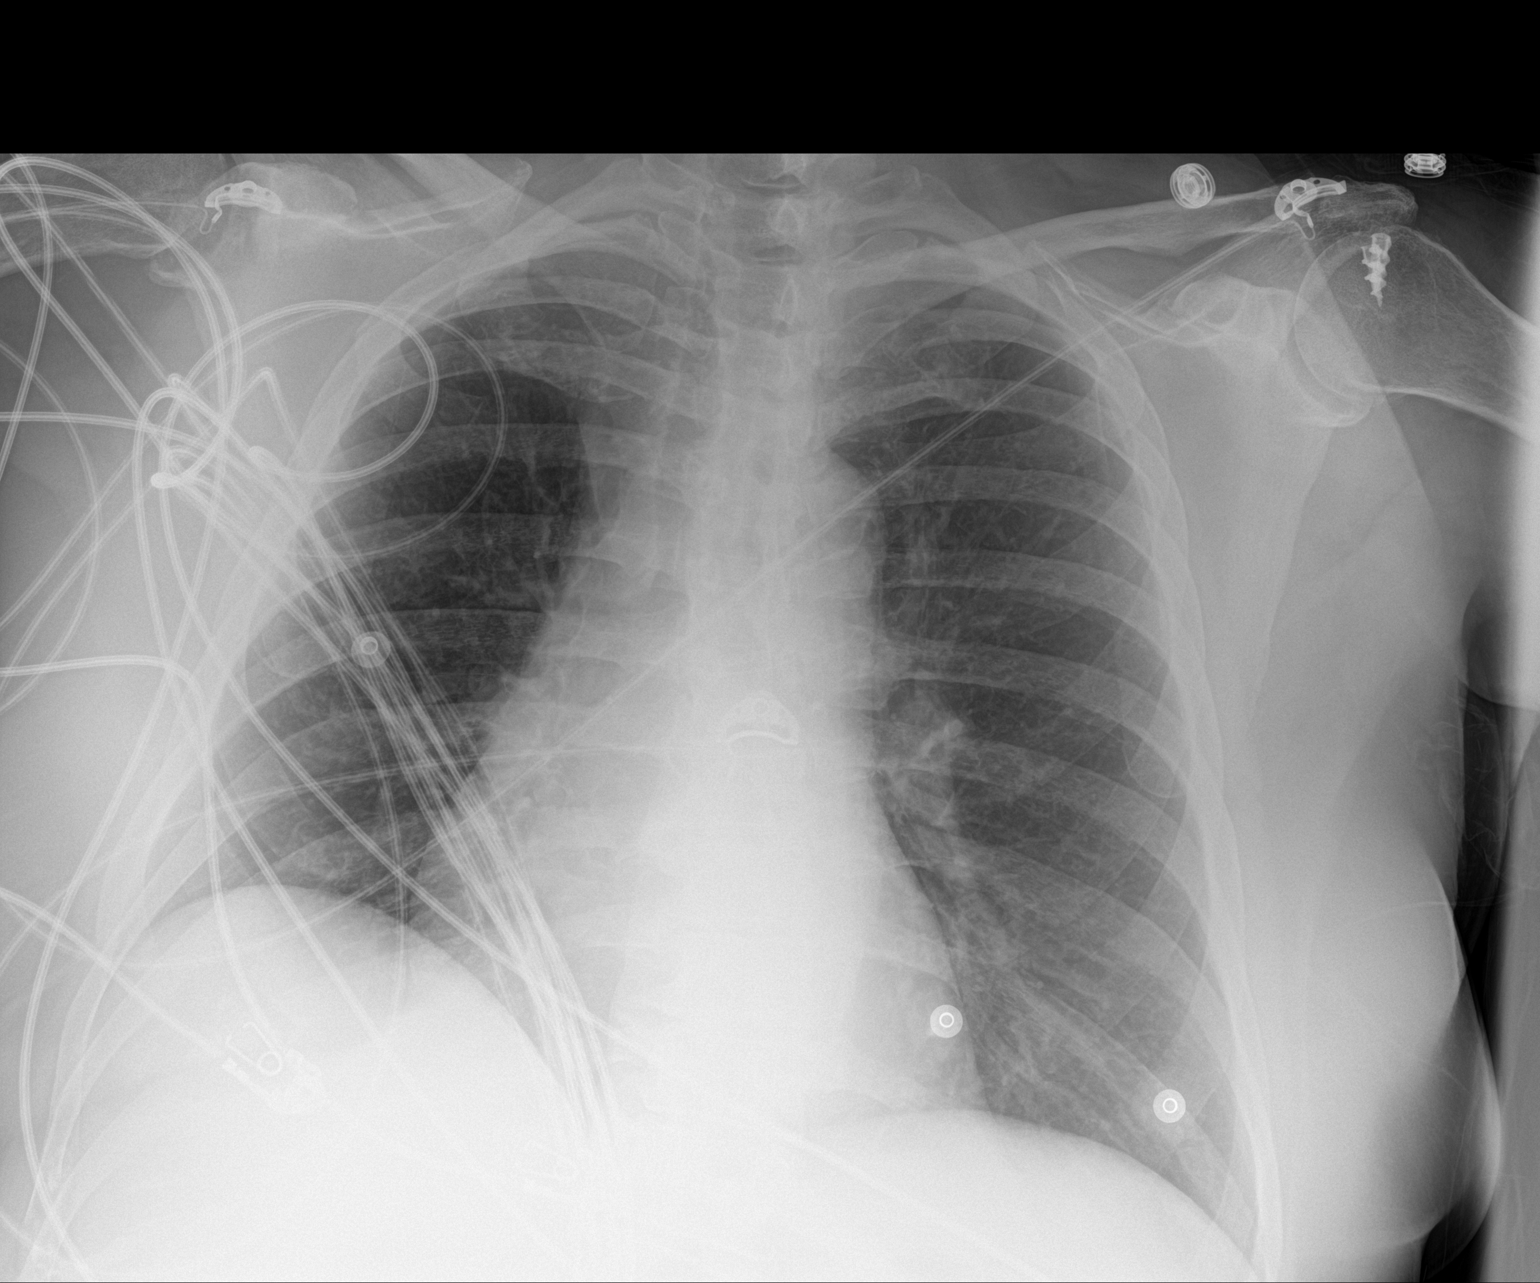

[1 of 1 positions shown; findings below may reference images not displayed]

FINDINGS: The lungs are clear. Heart size is normal. No pneumothorax or
pleural effusion. No acute bony abnormality. Postoperative change
left shoulder noted.
IMPRESSION: No acute disease.

## 2019-11-25 ENCOUNTER — Other Ambulatory Visit: Payer: Self-pay | Admitting: Internal Medicine

## 2019-11-25 NOTE — Progress Notes (Signed)
   CC: Diabetes Mellitus  HPI:  [name] is a 69 y.o. with diabetes mellitus, hypertension, major depressive disorder who presents for follow-up.   Past Medical History:  Diagnosis Date  . Acute blood loss anemia 09/27/2018  . Allergy   . Anemia   . Arthritis    "left knee" (09/08/2015)  . Asthma   . Blood transfusion without reported diagnosis 10/2017   anemic  . Brachial plexus disorders   . CHF (congestive heart failure) ([place])   . Chronic pain    neck pain, headache, neuropathy  . COPD (chronic obstructive pulmonary disease) ([place])    SEES ONLY DR. PATEL -   . Depression    "when my husband passed in 2013"  . Facial trauma 05/2018  . Facial trauma, initial encounter 06/18/2018  . Fall 04/19/2018  . Gastric ulcer   . GERD (gastroesophageal reflux disease)   . Hemorrhagic gastritis   . History of kidney stones   . Hypertension   . Hypertriglyceridemia   . IDA (iron deficiency anemia)   . Internal hemorrhoids   . Intracranial aneurysm   . Migraine    "monthly" (09/08/2015)  . Neuromuscular disorder (HCC)    neuropathy  . Puncture wound of foot, right 05/17/2012   Tetanus shot 3 yrs ago at [facility] in [place], per pt report   . Stroke ([place])    TIAS    IN [place]   4 YRS AGO   . Type II diabetes mellitus ([place]) 2010   diagnosed around 2010, only ever on metformin   Review of Systems:  Denies dizziness, nausea, vomiting  Physical Exam:  Vitals:   11/26/19 1318  BP: (!) 109/42  Pulse: 91  Temp: 98.1 F (36.7 C)  TempSrc: Oral  SpO2: 96%  Weight: 186 lb 1.6 oz (84.4 kg)  Height: 5\' 5"  (1.651 m)   Physical Exam  HENT:  Head: Normocephalic and atraumatic.  Cardiovascular: Normal rate, regular rhythm and normal pulses.  Respiratory: Effort normal and breath sounds normal.  GI: Soft. Normal appearance and bowel sounds are normal.  Musculoskeletal:     Comments: 5/5 strength in bilateral lower extremities proximally and distally.       Neurological: She is alert.  Psychiatric: Her behavior is normal. Mood, judgment and thought content normal.   Assessment & Plan:   See Encounters Tab for problem based charting.  Patient discussed with Dr. Philipp Ovens

## 2019-11-26 ENCOUNTER — Ambulatory Visit (INDEPENDENT_AMBULATORY_CARE_PROVIDER_SITE_OTHER): Payer: Medicare Other | Admitting: Internal Medicine

## 2019-11-26 ENCOUNTER — Encounter: Payer: Self-pay | Admitting: Internal Medicine

## 2019-11-26 VITALS — BP 109/42 | HR 91 | Temp 98.1°F | Ht 65.0 in | Wt 186.1 lb

## 2019-11-26 DIAGNOSIS — E118 Type 2 diabetes mellitus with unspecified complications: Secondary | ICD-10-CM

## 2019-11-26 DIAGNOSIS — Z0000 Encounter for general adult medical examination without abnormal findings: Secondary | ICD-10-CM

## 2019-11-26 DIAGNOSIS — W19XXXA Unspecified fall, initial encounter: Secondary | ICD-10-CM | POA: Diagnosis not present

## 2019-11-26 DIAGNOSIS — R6889 Other general symptoms and signs: Secondary | ICD-10-CM | POA: Diagnosis not present

## 2019-11-26 DIAGNOSIS — F17200 Nicotine dependence, unspecified, uncomplicated: Secondary | ICD-10-CM | POA: Diagnosis not present

## 2019-11-26 DIAGNOSIS — E1142 Type 2 diabetes mellitus with diabetic polyneuropathy: Secondary | ICD-10-CM

## 2019-11-26 DIAGNOSIS — K250 Acute gastric ulcer with hemorrhage: Secondary | ICD-10-CM

## 2019-11-26 LAB — GLUCOSE, CAPILLARY: Glucose-Capillary: 128 mg/dL — ABNORMAL HIGH (ref 70–99)

## 2019-11-26 MED ORDER — XULTOPHY 100-3.6 UNIT-MG/ML ~~LOC~~ SOPN: 16.0000 [IU] | PEN_INJECTOR | Freq: Every day | SUBCUTANEOUS | 1 refills | Status: DC

## 2019-11-26 MED ORDER — INVOKAMET XR 150-500 MG PO TB24: 2.0000 | ORAL_TABLET | Freq: Every day | ORAL | 0 refills | Status: DC

## 2019-11-26 NOTE — Patient Instructions (Addendum)
It was a pleasure to see you today Ms. Riechers. Please make the following changes:  -stop [place]  -stop tresiba and start taking xultophy 16u daily  -we got bloodwork from you today  -follow up with podiatry, pain mgmt, and gastroenterology doctors  If you have any questions or concerns, please call our clinic at [phone] between 9am-5pm and after hours call [phone] and ask for the internal medicine resident on call. If you feel you are having a medical emergency please call 911.   Thank you, we look forward to help you remain healthy!  Lars Mage, MD Internal Medicine PGY3   To schedule an appointment for a COVID vaccine or be added to the vaccine wait list: Go to [email or web address]   OR Go to [email or web address]                  OR Call [phone]                                     OR Call [phone] and select Option 2  [place] ([place]) [place] [place], [place] [address] Hours: Mon,Thu 8-5, Sat 8-12 Type: [place] (12+)  [place]  ([place]) [place] [employer] [place], [place] [address] Hours: Thu: 1-5 Type: Pfizer (12+)   [facility] center in [place], [place] Address: [address] [place], [place], [place] [address] Phone: [phone]

## 2019-11-27 ENCOUNTER — Other Ambulatory Visit: Payer: Self-pay

## 2019-11-27 ENCOUNTER — Encounter
Payer: Medicare Other | Attending: Physical Medicine and Rehabilitation | Admitting: Physical Medicine and Rehabilitation

## 2019-11-27 VITALS — BP 108/70 | HR 84 | Temp 98.2°F | Ht 65.0 in | Wt 183.4 lb

## 2019-11-27 DIAGNOSIS — M4802 Spinal stenosis, cervical region: Secondary | ICD-10-CM | POA: Diagnosis not present

## 2019-11-27 DIAGNOSIS — M542 Cervicalgia: Secondary | ICD-10-CM | POA: Diagnosis present

## 2019-11-27 DIAGNOSIS — G8929 Other chronic pain: Secondary | ICD-10-CM | POA: Diagnosis present

## 2019-11-27 DIAGNOSIS — M25361 Other instability, right knee: Secondary | ICD-10-CM

## 2019-11-27 DIAGNOSIS — M5137 Other intervertebral disc degeneration, lumbosacral region: Secondary | ICD-10-CM | POA: Diagnosis not present

## 2019-11-27 DIAGNOSIS — G4701 Insomnia due to medical condition: Secondary | ICD-10-CM

## 2019-11-27 DIAGNOSIS — M5441 Lumbago with sciatica, right side: Secondary | ICD-10-CM | POA: Insufficient documentation

## 2019-11-27 LAB — BMP8+ANION GAP
Anion Gap: 15 mmol/L (ref 10.0–18.0)
BUN/Creatinine Ratio: 14 (ref 12–28)
BUN: 17 mg/dL (ref 8–27)
CO2: 20 mmol/L (ref 20–29)
Calcium: 9.3 mg/dL (ref 8.7–10.3)
Chloride: 103 mmol/L (ref 96–106)
Creatinine, Ser: 1.2 mg/dL — ABNORMAL HIGH (ref 0.57–1.00)
GFR calc Af Amer: 54 mL/min/{1.73_m2} — ABNORMAL LOW (ref >59)
GFR calc non Af Amer: 47 mL/min/{1.73_m2} — ABNORMAL LOW (ref >59)
Glucose: 113 mg/dL — ABNORMAL HIGH (ref 65–99)
Potassium: 4.7 mmol/L (ref 3.5–5.2)
Sodium: 138 mmol/L (ref 134–144)

## 2019-11-27 LAB — MICROALBUMIN / CREATININE URINE RATIO
Creatinine, Urine: 69.7 mg/dL
Microalb/Creat Ratio: 142 mg/g creat — ABNORMAL HIGH (ref 0–29)
Microalbumin, Urine: 98.9 ug/mL

## 2019-11-27 LAB — VITAMIN B12: Vitamin B-12: 375 pg/mL (ref 232–1245)

## 2019-11-27 MED ORDER — AMITRIPTYLINE HCL 50 MG PO TABS: 50.0000 mg | ORAL_TABLET | Freq: Every day | ORAL | 3 refills | Status: DC

## 2019-11-27 NOTE — Assessment & Plan Note (Signed)
Patient refused the covid vaccine

## 2019-11-27 NOTE — Assessment & Plan Note (Signed)
Patient has been having frequent falls (approximately 3) over the past month. She states that usually the falls are due to her legs getting weak and giving out. She denies any loss of consciousness or palpitations or dizziness prior to the falls. Xray of lumbar spine done April 2021 shows Dextroscoliosis with multilevel degenerative changes and Degenerative changes at the levels of C4-C5 and C5-C6.   Assessment and plan  Cause of patient's frequent falls is likely multifocal. She is on several centrally acting medications: Gabapentin 900mg  tid, amitriptyline 25mg  hs. Her gait is likely affected secondary to right first ray amputation done in 2016. Furthermore, she has severe neuropathy.   Will check vitamin b12 level as a possible cause of worsening neuropathy given that she is taking metformin. Patient is to follow with pm&r to discuss ways to control the pain secondary to degeerative changes in spine. She should also follow with podiatry to see if there are any particular foot support that can be provided. Patient is due to see physical therapy and she should get a fall assessment done at that time.

## 2019-11-27 NOTE — Assessment & Plan Note (Signed)
Patient has been taking pantoprazole 40mg  bid. She states that she still has occasional acid reflux and she has excess flatulence. EGD done in August 2019 showed hemorrhagic gastritis  Assessment and plan  Patient is due for follow up with gastroenterology Dr. Hilarie Fredrickson.

## 2019-11-27 NOTE — Assessment & Plan Note (Signed)
Patient's last a1c is 10.9 in April 2021. Patient is currently taking canagliflozin-metformin 150-500mg  two tablets daily and tresiba 20u daily. Patient states that she did not know that [place] was stopped and had still been taking it. Blood glucose at home has been ranging 190-250s.   Assessment and plan  In order to decrease pill burden will stop [place] and tresiba and start xultophy 16u qd. The patient is to record blood glucose readings and will be followed by diabetes coordinator. She is to get repeat a1c check in July 20201. Will obtain microalbumin/creatinine ratio.

## 2019-11-27 NOTE — Progress Notes (Signed)
Subjective:    Patient ID: [name], female    DOB: [birth year], 69 y.o.   MRN: [id number]  HPI  [name] presents for severe pain in her bilateral shoulders, neck pain, low back pain, left knee pain, and bilateral ankles.Patient was last seen on 5/18.  The pain is currently6/10,and worst in her low back and neck.This is stable from last visit.   She cannot take Aspirin as she has had bleeding ulcers. She was recently started on Fioricet, which helps with the migraines, but this was recently stopped as she has had 2 falls in the past month and PCP thought sedating medications may be contributing. During these falls her knees buckled. She did not significantly injure herself. If she does not sleep correctly, she feels the pain in the morning, and she has recently been sleeping very poorly.She has been on a multitude of pain medications in the past when she was in [place].Prior visit we tried Amitriptyline 10mg  and she had not side effects from this medication but it did not help with either her pain or insomnia. We increased dose to 25mg  last visit and this did help with insomnia but not pain. She would like to try a higher dose. Has not experienced negative side effects.   She sometimes gets sciatic pain and peripheral neuropathyand was evaluated as a candidate for an epidural in [place] but was told her disc degeneration was too severe.  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   Previously we tried her on Cymbalta but this provided no relief.   She is a great grandmother and takes care of her 22 year old great grandson, who is a great source of joy to her. He has his kindergarten graduation yesterday and she was very proud. She is his primary caretaker though she does live his his mother and father as well. She does not  walk much because of her diffuse pains as well as a right foot callous for which she sees podiatry, but she has been trying to more and has achieved 4 lbs weight loss in the past 2 weeks! She does try to go outside with her great grandson where she can watch him play. She tries to at least do leg and arm strengthening exercises when she is seated to maintain her strength.  She has been walking with her neighbors for 10-20 minutes since last visit and has lost 4 lbs over the last month, which is a total of 8 lbs over the past 2 months.   Pain Inventory Average Pain 6 Pain Right Now 6 My pain is constant, burning, dull and aching  In the last 24 hours, has pain interfered with the following? General activity 3 Relation with others 0 Enjoyment of life 0 What TIME of day is your pain at its worst? evening and night Sleep (in general) Poor  Pain is worse with: walking and standing Pain improves with: rest Relief from Meds: 2  Mobility walk with assistance use a walker how many minutes can you walk? 10-15 ability to climb steps?  yes do you drive?  no  Function retired I need assistance with the following:  bathing and household duties  Neuro/Psych bladder control problems  Prior Studies Any changes since last visit?  no  Physicians involved in your care Any changes  since last visit?  no   Family History  Problem Relation Age of Onset  . Hyperlipidemia Mother   . Heart attack Mother   . Heart attack Father 49  . Hypertension Father   . Cancer Paternal Grandfather        Lung cancer  . Heart attack Paternal Grandfather   . Cancer Paternal Grandmother   . Obesity Daughter        gastric bypass surgery  . Arthritis Sister   . Migraines Daughter   . Chiari malformation Daughter   . Arthritis Daughter   . [place] White syndrome Daughter   . Colon cancer Neg Hx   . Colon polyps Neg Hx   . Esophageal cancer Neg Hx   . Rectal cancer Neg Hx   . Stomach cancer  Neg Hx    Social History   Socioeconomic History  . Marital status: Widowed    Spouse name: Not on file  . Number of children: Not on file  . Years of education: 27  . Highest education level: Not on file  Occupational History  . Occupation: Retired  . Occupation: Disabled  Tobacco Use  . Smoking status: Current Every Day Smoker    Packs/day: 0.30    Years: 49.00    Pack years: 14.70  . Smokeless tobacco: Never Used  . Tobacco comment: 3 cigs per day  Vaping Use  . Vaping Use: Never used  Substance and Sexual Activity  . Alcohol use: No    Alcohol/week: 0.0 standard drinks  . Drug use: No  . Sexual activity: Not Currently  Other Topics Concern  . Not on file  Social History Narrative   Moved to [facility] from [place], shortly after the death of her husband, to live with her daughter.  She notes significant life stress related to her 104 year-old grand-daughter with bipolar disorder.  She has previously worked as a Occupational psychologist.      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 There are 2 steps without handrails up to the entrance the patient uses.       Patient's method of transportation is via family member.      The highest level of education was some college: 2 Careers adviser (pharmacy and psych).      The patient currently Retired/disabled.      Identified important Relationships are "My daughter and granddaughter."      Pets : "2 indoor cats, 1 indoor dog, 1 outdoor dog, and a lizard in a tank."          Left handed   Interests / Fun: "Have fun with my great grandson playing around the house, watching him play outside with his imagination."         Current Stressors: "My granddaughter's fiance and my granddaughter."       Religious / Personal Beliefs: "Protestant"       L. Ducatte, BSN, RN-BC       Social Determinants of Health   Financial Resource Strain: Medium Risk  . Difficulty of Paying Living Expenses: Somewhat hard  Food Insecurity: Food Insecurity  Present  . Worried About Charity fundraiser in the Last Year: Sometimes true  . Ran Out of Food in the Last Year: Sometimes true  Transportation Needs: No Transportation Needs  . Lack of Transportation (Medical): No  . Lack of Transportation (Non-Medical): No  Physical Activity: Inactive  . Days of Exercise per Week: 0 days  . Minutes of Exercise per Session: 0 min  Stress: Stress Concern Present  . Feeling of Stress : To some extent  Social Connections:   . Frequency of Communication with Friends and Family:   . Frequency of Social Gatherings with Friends and Family:   . Attends Religious Services:   . Active Member of Clubs or Organizations:   . Attends Archivist Meetings:   Marland Kitchen Marital Status:    Past Surgical History:  Procedure Laterality Date  . AMPUTATION Right 05/01/2015   Procedure: Right Foot 1st Ray Amputation;  Surgeon: Newt Minion, MD;  Location: [facility];  Service: Orthopedics;  Laterality: Right;  . ARTHRODESIS METATARSAL     RIGHT 5 TH   . BILATERAL SALPINGOOPHORECTOMY Bilateral 1986   "after hemtoma evacuations; had to cut me open"  . BIOPSY  11/07/2017   Procedure: BIOPSY;  Surgeon: Irene Shipper, MD;  Location[facility] ENDOSCOPY;  Service: Endoscopy;;  . CARPAL TUNNEL RELEASE Bilateral   . COLONOSCOPY     10 + yrs ago- pt unsure- was in [place]- pt states  MD will not send records but colon was normal per pt.   . CYSTOSCOPY W/ URETERAL STENT PLACEMENT Left 09/06/2018   Procedure: CYSTOSCOPY WITH LEFT RETROGRADE PYELOGRAM/URETERAL LEFT DOUBLE J STENT PLACEMENT;  Surgeon: Franchot Gallo, MD;  Location: [facility];  Service: Urology;   Laterality: Left;  . DILATION AND CURETTAGE OF UTERUS  ~ 1982   S/P miscarriage  . ESOPHAGOGASTRODUODENOSCOPY (EGD) WITH PROPOFOL N/A 11/07/2017   Procedure: ESOPHAGOGASTRODUODENOSCOPY (EGD) WITH PROPOFOL;  Surgeon: Irene Shipper, MD;  Location: [facility] ENDOSCOPY;  Service: Endoscopy;  Laterality: N/A;  . EXTRACORPOREAL SHOCK WAVE LITHOTRIPSY Left 09/24/2018   Procedure: EXTRACORPOREAL SHOCK WAVE LITHOTRIPSY (ESWL);  Surgeon: Cleon Gustin, MD;  Location: [facility];  Service: Urology;  Laterality: Left;  . HEMATOMA EVACUATION Right 1986 x 2   "OVARY; w/in 1 wk after hysterectomy"  . implantable loop recorder placement  05/23/2019   MDT Reveal [facility] [id number]) implanted by Dr Rayann Heman in office for evaluation of syncope  . KNEE ARTHROSCOPY     LEFT  . LAPAROSCOPIC CHOLECYSTECTOMY    . SHOULDER ARTHROSCOPY W/ ROTATOR CUFF REPAIR Bilateral   . TONSILLECTOMY    . TOTAL KNEE ARTHROPLASTY Left 09/30/2016   Procedure: LEFT TOTAL KNEE ARTHROPLASTY;  Surgeon: Netta Cedars, MD;  Location: [facility];  Service: Orthopedics;  Laterality: Left;  . TUBAL LIGATION    . VAGINAL HYSTERECTOMY  1986   Past Medical History:  Diagnosis Date  . Acute blood loss anemia 09/27/2018  . Allergy   . Anemia   . Arthritis    "left knee" (09/08/2015)  . Asthma   . Blood transfusion without reported diagnosis 10/2017   anemic  . Brachial plexus disorders   . CHF (congestive heart failure) ([place])   . Chronic pain    neck pain, headache, neuropathy  . COPD (chronic obstructive pulmonary disease) ([place])    SEES ONLY DR. PATEL -   . Depression    "when my husband passed in 2013"  . Facial trauma 05/2018  . Facial trauma, initial  encounter 06/18/2018  . Fall 04/19/2018  . Gastric ulcer   . GERD (gastroesophageal reflux disease)   . Hemorrhagic gastritis   . History of kidney stones   . Hypertension   . Hypertriglyceridemia   . IDA (iron deficiency anemia)   . Internal hemorrhoids   . Intracranial aneurysm   .  Migraine    "monthly" (09/08/2015)  . Neuromuscular disorder (HCC)    neuropathy  . Puncture wound of foot, right 05/17/2012   Tetanus shot 3 yrs ago at [facility] in [place], per pt report   . Stroke ([place])    TIAS    IN [place]   4 YRS AGO   . Type II diabetes mellitus ([place]) 2010   diagnosed around 2010, only ever on metformin   BP 108/70   Pulse 84   Temp 98.2 F (36.8 C)   Ht 5\' 5"  (1.651 m)   Wt 183 lb 6.4 oz (83.2 kg)   SpO2 92%   BMI 30.52 kg/m   Opioid Risk Score:   Fall Risk Score:  `1  Depression screen PHQ 2/9  Depression screen [facility] 2/9 11/26/2019 11/13/2019 10/29/2019 10/02/2019 09/17/2019 08/13/2019 06/04/2019  Decreased Interest 1 0 0 0 0 2 1  Down, Depressed, Hopeless 0 0 0 0 0 0 0  PHQ - 2 Score 1 0 0 0 0 2 1  Altered sleeping 0 - - 1 1 1 3   Tired, decreased energy 2 - - 0 0 1 3  Change in appetite 1 - - 1 1 0 3  Feeling bad or failure about yourself  0 - - 0 - 0 0  Trouble concentrating 0 - - 0 0 0 1  Moving slowly or fidgety/restless 0 - - 0 0 0 0  Suicidal thoughts 0 - - 0 0 0 0  PHQ-9 Score 4 - - 2 2 4 11   Difficult doing work/chores Not difficult at all Not difficult at all - Not difficult at all - Somewhat difficult Somewhat difficult  Some recent data might be hidden    Review of Systems  All other systems reviewed and are negative.      Objective:   Physical Exam Gen: no distress, normal appearing HEENT: oral mucosa pink and moist, NCAT Cardio: Reg rate Chest: normal effort, normal rate of breathing Abd: soft, non-distended Ext: no edema Skin: intact Neuro:AOx3. Musculoskeletal:Antalgic gait, lower back pain bilaterally. +slump test bilaterally. Sensation diminished in bilateral feet.Toe amputations in left foot. Psych: pleasant, normal affect, brightens when talking about her great grandson.    Assessment & Plan:  [name] presents for severe pain in her bilateral shoulders, neck pain, low back pain, left knee pain, and  bilateral ankles.  -Reviewed results of lumbar and cervical XRs with her: they show cervical stenosis and lumbar disc degeneration.  -Cymbalta did not provide relief. Started Amitriptyline 10mg  HS previously to help with both neuropathic pain and insomnia.She had neither side effects nor benefits. Increase dose to 25mg  HS last visit which did help with insomnia. Will increase to 50mg  HS with goal of pain relief as well. Advised regarding possible synergistic effects with other antidepressants she is on.   -Discussed exercise and nutritional counseling, including exercises she can do while caring for her great grandson. Has lost 8 lbs in past 2 months!  -Major depressive disorder: discussed things that bring her joy. She is doing better. Amitriptyline will also help with this. Discussed risk of serotonin syndrome and to stop medication should  she experience facial flushing, headache.   -Could consider cervical ESI in future.  All questions answered. RCT in 1 month.

## 2019-11-27 NOTE — Assessment & Plan Note (Signed)
Patient states that she is down to 3 cigarettes per day. She is not ready to quit completely

## 2019-11-28 ENCOUNTER — Encounter: Payer: Self-pay | Admitting: Physical Medicine and Rehabilitation

## 2019-11-28 NOTE — Progress Notes (Signed)
Internal Medicine Clinic Attending  Case discussed with Dr. Chundi at the time of the visit.  We reviewed the resident's history and exam and pertinent patient test results.  I agree with the assessment, diagnosis, and plan of care documented in the resident's note. 

## 2019-11-29 ENCOUNTER — Other Ambulatory Visit: Payer: Self-pay | Admitting: Physical Medicine and Rehabilitation

## 2019-11-29 ENCOUNTER — Other Ambulatory Visit: Payer: Self-pay | Admitting: Internal Medicine

## 2019-11-29 ENCOUNTER — Encounter: Payer: Self-pay | Admitting: *Deleted

## 2019-11-29 MED ORDER — LISINOPRIL 5 MG PO TABS: 5.0000 mg | ORAL_TABLET | Freq: Every day | ORAL | 0 refills | Status: DC

## 2019-11-29 NOTE — Progress Notes (Unsigned)
Stop amlodipine  Start lisinopril for renal protection against diabetic nephropathy. She does not have any allergies to acei/arb. Follow up in 2 weeks for re-evaluation.

## 2019-12-03 ENCOUNTER — Other Ambulatory Visit: Payer: Self-pay | Admitting: Internal Medicine

## 2019-12-03 ENCOUNTER — Encounter: Payer: Medicare Other | Admitting: Internal Medicine

## 2019-12-03 DIAGNOSIS — E118 Type 2 diabetes mellitus with unspecified complications: Secondary | ICD-10-CM

## 2019-12-03 DIAGNOSIS — K2950 Unspecified chronic gastritis without bleeding: Secondary | ICD-10-CM

## 2019-12-04 ENCOUNTER — Telehealth: Payer: Self-pay | Admitting: Internal Medicine

## 2019-12-04 ENCOUNTER — Telehealth: Payer: Self-pay | Admitting: *Deleted

## 2019-12-04 NOTE — Telephone Encounter (Addendum)
Information was sent through CoverMyMeds for PA for Xultophy.  Awaiting determination within 72 hours.  Sander Nephew, RN 12/04/2019 3:18 PM.     PA for Xultophy was approved6/23/202 through 06/12/2020.  Sander Nephew,  RN 12/25/2019 4:18 PM.

## 2019-12-04 NOTE — Telephone Encounter (Signed)
Pt requesting a call back about a Prior Authorization on a New Medication.  Please call pt back.

## 2019-12-09 ENCOUNTER — Ambulatory Visit (INDEPENDENT_AMBULATORY_CARE_PROVIDER_SITE_OTHER): Payer: Medicare Other | Admitting: *Deleted

## 2019-12-09 DIAGNOSIS — R55 Syncope and collapse: Secondary | ICD-10-CM | POA: Diagnosis not present

## 2019-12-09 LAB — CUP PACEART REMOTE DEVICE CHECK
Date Time Interrogation Session: 20210627230540
Implantable Pulse Generator Implant Date: 20201210

## 2019-12-10 ENCOUNTER — Ambulatory Visit: Payer: Medicare Other

## 2019-12-10 ENCOUNTER — Telehealth: Payer: Self-pay | Admitting: *Deleted

## 2019-12-10 ENCOUNTER — Encounter: Payer: Medicare Other | Admitting: Internal Medicine

## 2019-12-10 NOTE — Progress Notes (Signed)
Carelink Summary Report / Loop Recorder 

## 2019-12-10 NOTE — Telephone Encounter (Signed)
Call to patient regarding missed appointment on today.  Staed not feeling well fell and hurt finger yesterday.  Refuses to come today.  Stated would call for an appointment next week.  Applied ice to finger.  Encouraged to call for an appointment soon.  Sander Nephew, RN 12/10/2019 11:43 AM.

## 2019-12-11 ENCOUNTER — Ambulatory Visit (INDEPENDENT_AMBULATORY_CARE_PROVIDER_SITE_OTHER): Payer: Medicare Other | Admitting: Internal Medicine

## 2019-12-11 ENCOUNTER — Encounter: Payer: Self-pay | Admitting: Internal Medicine

## 2019-12-11 ENCOUNTER — Other Ambulatory Visit: Payer: Self-pay

## 2019-12-11 VITALS — BP 118/61 | HR 97 | Temp 98.2°F | Ht 65.0 in | Wt 187.6 lb

## 2019-12-11 DIAGNOSIS — Z794 Long term (current) use of insulin: Secondary | ICD-10-CM | POA: Diagnosis not present

## 2019-12-11 DIAGNOSIS — E1142 Type 2 diabetes mellitus with diabetic polyneuropathy: Secondary | ICD-10-CM

## 2019-12-11 DIAGNOSIS — Z9181 History of falling: Secondary | ICD-10-CM | POA: Diagnosis not present

## 2019-12-11 DIAGNOSIS — I10 Essential (primary) hypertension: Secondary | ICD-10-CM | POA: Diagnosis not present

## 2019-12-11 DIAGNOSIS — E118 Type 2 diabetes mellitus with unspecified complications: Secondary | ICD-10-CM

## 2019-12-11 DIAGNOSIS — W19XXXA Unspecified fall, initial encounter: Secondary | ICD-10-CM

## 2019-12-11 LAB — POCT GLYCOSYLATED HEMOGLOBIN (HGB A1C): Hemoglobin A1C: 8.8 % — AB (ref 4.0–5.6)

## 2019-12-11 LAB — GLUCOSE, CAPILLARY: Glucose-Capillary: 109 mg/dL — ABNORMAL HIGH (ref 70–99)

## 2019-12-11 MED ORDER — XULTOPHY 100-3.6 UNIT-MG/ML ~~LOC~~ SOPN: 18.0000 [IU] | PEN_INJECTOR | Freq: Every day | SUBCUTANEOUS | 1 refills | Status: DC

## 2019-12-11 NOTE — Assessment & Plan Note (Signed)
  Patient has had one fall since last visit. She describes that her knees buckled and caused her to fall. Denies dizziness. Patient is being followed by pm&r for cervical and lumbar disc degeneration which maybe a risk factor for falls. She is getting treatment for diabetic neuropathy. She had a left knee replacement in April 2018. Xrays of right knee in August 2019 shows osteoarthritis. She may also have a component of patellofemoral syndrome.   Assessment and plan  -Will go to physical therapy and have fall assessment done. Patient states that she did not receive call from PT office. She was given their phone number to schedule.  -Continue to monitor patient closely and assess whether she will need repeat dexa scan. Does not require treatment at this time. Although t score and frax 10 year risk is within normal range the patient has several risk factors for osteoporosis including taking protonix, having a bilateral ovariectomy, smoking. She will need to be monitored.

## 2019-12-11 NOTE — Progress Notes (Signed)
Internal Medicine Clinic Attending  Case discussed with Dr. Chundi at the time of the visit.  We reviewed the resident's history and exam and pertinent patient test results.  I agree with the assessment, diagnosis, and plan of care documented in the resident's note.  Omunique Pederson, M.D., Ph.D.  

## 2019-12-11 NOTE — Progress Notes (Signed)
   CC: Diabetes Follow up  HPI:  [name] is a 69 y.o. with diabetes mellitus type 2, hypertension, and major depressive disorder who presents for follow up of diabetes. Please see problem based charting for evaluation, assessment, and plan.  Past Medical History:  Diagnosis Date  . Acute blood loss anemia 09/27/2018  . Allergy   . Anemia   . Arthritis    "left knee" (09/08/2015)  . Asthma   . Blood transfusion without reported diagnosis 10/2017   anemic  . Brachial plexus disorders   . CHF (congestive heart failure) ([place])   . Chronic pain    neck pain, headache, neuropathy  . COPD (chronic obstructive pulmonary disease) ([place])    SEES ONLY DR. PATEL -   . Depression    "when my husband passed in 2013"  . Facial trauma 05/2018  . Facial trauma, initial encounter 06/18/2018  . Fall 04/19/2018  . Gastric ulcer   . GERD (gastroesophageal reflux disease)   . Hemorrhagic gastritis   . History of kidney stones   . Hypertension   . Hypertriglyceridemia   . IDA (iron deficiency anemia)   . Internal hemorrhoids   . Intracranial aneurysm   . Migraine    "monthly" (09/08/2015)  . Neuromuscular disorder (HCC)    neuropathy  . Puncture wound of foot, right 05/17/2012   Tetanus shot 3 yrs ago at [facility] in [place], per pt report   . Stroke ([place])    TIAS    IN [place]   4 YRS AGO   . Type II diabetes mellitus ([place]) 2010   diagnosed around 2010, only ever on metformin   Review of Systems:    Denies dizziness, chest pain, dyspnea Has numbness and tingling in LE Occasional headaches  Physical Exam:  Vitals:   12/11/19 1400  BP: 118/61  Pulse: 97  Temp: 98.2 F (36.8 C)  TempSrc: Oral  SpO2: 97%  Weight: 187 lb 9.6 oz (85.1 kg)  Height: 5\' 5"  (1.651 m)   Physical Exam Constitutional:      Appearance: Normal appearance. She is obese.  HENT:     Head: Normocephalic and atraumatic.  Cardiovascular:     Rate and Rhythm: Regular rhythm. Tachycardia  present.  Pulmonary:     Effort: Pulmonary effort is normal. No respiratory distress.     Breath sounds: No stridor. Wheezing present.  Abdominal:     General: Abdomen is flat. Bowel sounds are normal.     Palpations: Abdomen is soft.  Musculoskeletal:     Comments: 5/5 strength in bilateral upper and lower extremities  No warmth, tenderness to palpation, or effusion over bilateral knees  Neurological:     Mental Status: She is alert.  Psychiatric:        Mood and Affect: Mood normal.        Thought Content: Thought content normal.    Assessment & Plan:   See Encounters Tab for problem based charting.  Patient seen with Dr. Rebeca Alert

## 2019-12-11 NOTE — Patient Instructions (Signed)
It was a pleasure to see you today [name]. Please make the following changes:  Great job on lowering your blood glucose! Please continue good lifestyle modifications. Increaase xultophy from 16u to 18u and continue invokamet. Follow up in 4 weeks.   Please make sure to go to physical therapy  If you have any questions or concerns, please call our clinic at [phone] between 9am-5pm and after hours call [phone] and ask for the internal medicine resident on call. If you feel you are having a medical emergency please call 911.   Thank you, we look forward to help you remain healthy!  Lars Mage, MD Internal Medicine PGY3   To schedule an appointment for a COVID vaccine or be added to the vaccine wait list: Go to [email or web address]   OR Go to [email or web address]                  OR Call [phone]                                     OR Call [phone] and select Option 2  [place] ([place]) [place] [place], [place] [address] Hours: Mon,Thu 8-5, Sat 8-12 Type: [place] (12+)  [place]  ([place]) [place] [employer] [place], [place] [address] Hours: Thu: 1-5 Type: Pfizer (12+)

## 2019-12-11 NOTE — Assessment & Plan Note (Signed)
The patient's last a1c was 10.9 in April 2021 and today is 8.8. Review of patient's blood glucose monitor shows readings ranging 100-200s throughout the day. The patient is currently taking xultophy 16u qam, invokamet 150-500mg  2 tablets daily.   Assessment and plan  Will increase xultophy from 16u to 18u. Continue invokamet. She is to follow up in 4 weeks.

## 2019-12-11 NOTE — Assessment & Plan Note (Signed)
Patient's blood pressure during this visit is 118/61. She is currently taking lisinopril 5mg  qd and amlodipine 10mg  qd. Patient's last few blood pressure readings are   BP Readings from Last 3 Encounters:  12/11/19 118/61  11/27/19 108/70  11/26/19 (!) 109/42   Assessment and plan  Patient's blood pressure in good range. May consider adjusting dose at next visit if need be. No dizziness.

## 2019-12-11 NOTE — Telephone Encounter (Addendum)
Patient needs follow up for her diabetes/blood glucose recheck given high a1c's. Patient is agreeable to be seen today by me. I am happy to see her this evening when I am in office. Doris to add patient to schedule for 2:30pm time slot.

## 2019-12-13 ENCOUNTER — Ambulatory Visit: Payer: Medicare Other | Attending: Physical Medicine and Rehabilitation | Admitting: Physical Therapy

## 2019-12-13 NOTE — Addendum Note (Signed)
Addended by: Hulan Fray on: 12/13/2019 05:31 PM   Modules accepted: Orders

## 2019-12-17 ENCOUNTER — Other Ambulatory Visit: Payer: Self-pay | Admitting: Internal Medicine

## 2019-12-17 NOTE — Telephone Encounter (Signed)
Will refill. Recommend obtaining a lipid panel on next set of labs.

## 2019-12-25 ENCOUNTER — Encounter
Payer: Medicare Other | Attending: Physical Medicine and Rehabilitation | Admitting: Physical Medicine and Rehabilitation

## 2019-12-25 ENCOUNTER — Ambulatory Visit: Payer: Medicare Other | Admitting: Podiatry

## 2019-12-25 DIAGNOSIS — M542 Cervicalgia: Secondary | ICD-10-CM | POA: Insufficient documentation

## 2019-12-25 DIAGNOSIS — G8929 Other chronic pain: Secondary | ICD-10-CM | POA: Insufficient documentation

## 2019-12-25 DIAGNOSIS — M5441 Lumbago with sciatica, right side: Secondary | ICD-10-CM | POA: Insufficient documentation

## 2019-12-25 NOTE — Telephone Encounter (Signed)
Pt is calling and stating she needs her xultophy, states pharm said they needed a PA Sending to [facility] She is out of med

## 2020-01-04 ENCOUNTER — Other Ambulatory Visit: Payer: Self-pay | Admitting: Neurology

## 2020-01-04 ENCOUNTER — Emergency Department (HOSPITAL_COMMUNITY)
Admission: EM | Admit: 2020-01-04 | Discharge: 2020-01-05 | Disposition: A | Discharge status: Home / Self Care | Payer: Medicare Other | Attending: Emergency Medicine | Admitting: Emergency Medicine

## 2020-01-04 ENCOUNTER — Emergency Department (HOSPITAL_COMMUNITY): Payer: Medicare Other

## 2020-01-04 ENCOUNTER — Encounter (HOSPITAL_COMMUNITY): Payer: Self-pay

## 2020-01-04 DIAGNOSIS — Z794 Long term (current) use of insulin: Secondary | ICD-10-CM | POA: Insufficient documentation

## 2020-01-04 DIAGNOSIS — I110 Hypertensive heart disease with heart failure: Secondary | ICD-10-CM | POA: Diagnosis not present

## 2020-01-04 DIAGNOSIS — J449 Chronic obstructive pulmonary disease, unspecified: Secondary | ICD-10-CM | POA: Insufficient documentation

## 2020-01-04 DIAGNOSIS — M533 Sacrococcygeal disorders, not elsewhere classified: Secondary | ICD-10-CM | POA: Diagnosis not present

## 2020-01-04 DIAGNOSIS — Z79899 Other long term (current) drug therapy: Secondary | ICD-10-CM | POA: Insufficient documentation

## 2020-01-04 DIAGNOSIS — I509 Heart failure, unspecified: Secondary | ICD-10-CM | POA: Insufficient documentation

## 2020-01-04 DIAGNOSIS — W19XXXA Unspecified fall, initial encounter: Secondary | ICD-10-CM | POA: Diagnosis not present

## 2020-01-04 DIAGNOSIS — Y929 Unspecified place or not applicable: Secondary | ICD-10-CM | POA: Insufficient documentation

## 2020-01-04 DIAGNOSIS — S7002XA Contusion of left hip, initial encounter: Secondary | ICD-10-CM | POA: Diagnosis not present

## 2020-01-04 DIAGNOSIS — F17200 Nicotine dependence, unspecified, uncomplicated: Secondary | ICD-10-CM | POA: Diagnosis not present

## 2020-01-04 DIAGNOSIS — Y939 Activity, unspecified: Secondary | ICD-10-CM | POA: Diagnosis not present

## 2020-01-04 DIAGNOSIS — Y999 Unspecified external cause status: Secondary | ICD-10-CM | POA: Insufficient documentation

## 2020-01-04 DIAGNOSIS — J45909 Unspecified asthma, uncomplicated: Secondary | ICD-10-CM | POA: Diagnosis not present

## 2020-01-04 DIAGNOSIS — E1140 Type 2 diabetes mellitus with diabetic neuropathy, unspecified: Secondary | ICD-10-CM | POA: Insufficient documentation

## 2020-01-04 DIAGNOSIS — Z7951 Long term (current) use of inhaled steroids: Secondary | ICD-10-CM | POA: Insufficient documentation

## 2020-01-04 DIAGNOSIS — S79912A Unspecified injury of left hip, initial encounter: Secondary | ICD-10-CM | POA: Diagnosis not present

## 2020-01-04 DIAGNOSIS — Z8673 Personal history of transient ischemic attack (TIA), and cerebral infarction without residual deficits: Secondary | ICD-10-CM | POA: Diagnosis not present

## 2020-01-04 DIAGNOSIS — M1612 Unilateral primary osteoarthritis, left hip: Secondary | ICD-10-CM | POA: Diagnosis not present

## 2020-01-04 DIAGNOSIS — Z96652 Presence of left artificial knee joint: Secondary | ICD-10-CM | POA: Diagnosis not present

## 2020-01-04 DIAGNOSIS — Z7982 Long term (current) use of aspirin: Secondary | ICD-10-CM | POA: Insufficient documentation

## 2020-01-04 DIAGNOSIS — E1165 Type 2 diabetes mellitus with hyperglycemia: Secondary | ICD-10-CM | POA: Diagnosis not present

## 2020-01-04 DIAGNOSIS — M5136 Other intervertebral disc degeneration, lumbar region: Secondary | ICD-10-CM | POA: Diagnosis not present

## 2020-01-04 DIAGNOSIS — I7090 Unspecified atherosclerosis: Secondary | ICD-10-CM | POA: Diagnosis not present

## 2020-01-04 MED ORDER — HYDROCODONE-ACETAMINOPHEN 5-325 MG PO TABS: 1.0000 | ORAL_TABLET | ORAL | 0 refills | Status: AC | PRN

## 2020-01-04 MED ORDER — HYDROCODONE-ACETAMINOPHEN 5-325 MG PO TABS
1.0000 | ORAL_TABLET | Freq: Once | ORAL | Status: AC
  Administered 2020-01-04: 1 via ORAL
  Filled 2020-01-04: qty 1

## 2020-01-04 MED ORDER — IBUPROFEN 400 MG PO TABS
600.0000 mg | ORAL_TABLET | Freq: Once | ORAL | Status: AC
  Administered 2020-01-04: 600 mg via ORAL
  Filled 2020-01-04: qty 1

## 2020-01-04 NOTE — ED Triage Notes (Addendum)
Pt arrives to ED w/ c/o 5/10 L hip pain secondary to mechanical fall on Monday. Pt denies LOC, aox4, no blood thinners.

## 2020-01-04 NOTE — ED Notes (Signed)
Lt hip pain after a fall last Monday more soreness each day  She walked in

## 2020-01-04 NOTE — ED Provider Notes (Signed)
[facility] EMERGENCY DEPARTMENT Provider Note   CSN: [id number] Arrival date & time: 01/04/20  1256     History Chief Complaint  Patient presents with  . Hip Pain    [name] is a 69 y.o. female.  Pt presents to the ED today with left hip pain from a fall which occurred on Monday, July 19.  Pt said she has frequent falls which she's had for years.  She said her legs just give out.  This is what happened on the 19th.  She still has a lot of pain to the left hip and is worried it's fractured.  She has been able to ambulate.  She also hit her left elbow, but that does not hurt any more.        Past Medical History:  Diagnosis Date  . Acute blood loss anemia 09/27/2018  . Allergy   . Anemia   . Arthritis    "left knee" (09/08/2015)  . Asthma   . Blood transfusion without reported diagnosis 10/2017   anemic  . Brachial plexus disorders   . CHF (congestive heart failure) ([place])   . Chronic pain    neck pain, headache, neuropathy  . COPD (chronic obstructive pulmonary disease) ([place])    SEES ONLY DR. PATEL -   . Depression    "when my husband passed in 2013"  . Facial trauma 05/2018  . Facial trauma, initial encounter 06/18/2018  . Fall 04/19/2018  . Gastric ulcer   . GERD (gastroesophageal reflux disease)   . Hemorrhagic gastritis   . History of kidney stones   . Hypertension   . Hypertriglyceridemia   . IDA (iron deficiency anemia)   . Internal hemorrhoids   . Intracranial aneurysm   . Migraine    "monthly" (09/08/2015)  . Neuromuscular disorder (HCC)    neuropathy  . Puncture wound of foot, right 05/17/2012   Tetanus shot 3 yrs ago at [facility] in [place], per pt report   . Stroke ([place])    TIAS    IN [place]   4 YRS AGO   . Type II diabetes mellitus ([place]) 2010   diagnosed around 2010, only ever on metformin    Patient Active Problem List   Diagnosis Date Noted  . Neck pain 06/04/2019  . Toothache 03/28/2019  . Enlarged lymph  node in neck 03/28/2019  . Irritant hand dermatitis 02/03/2019  . Dental caries 10/29/2018  . Perinephric hematoma 09/26/2018  . Nephrolithiasis 09/06/2018  . Vomiting 07/21/2018  . Post concussive syndrome 06/20/2018  . Head trauma 06/18/2018  . Intractable vomiting with nausea 06/18/2018  . Pain in joint of left shoulder 05/15/2018  . Falls 04/19/2018  . Lip laceration   . Iron deficiency anemia due to chronic blood loss 01/05/2018  . Symptomatic anemia 11/07/2017  . Pressure injury of skin 11/07/2017  . Acute gastric ulcer with hemorrhage   . Pain in left knee 09/12/2017  . Healthcare maintenance 09/09/2017  . Allergic rhinitis 09/09/2017  . Major depressive disorder 07/21/2017  . Dysuria 06/16/2017  . Demand ischemia ([place]) 06/16/2017  . Petechiae 06/16/2017  . Migraine 06/09/2017  . AKI (acute kidney injury) ([place])   . Right foot pain 04/22/2017  . Blister (nonthermal) of right ring finger, initial encounter 01/21/2017  . History of total knee replacement, 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  . Neck muscle strain 08/19/2016  . Onychomycosis of multiple toenails with type 2 diabetes mellitus ([place]) 11/04/2015  . Skin lesions 11/04/2015  .  Syncope   . Overweight (BMI 25.0-29.9) 05/27/2015  . Leg ulcer, left ([place]) 03/05/2015  . Recurrent falls 03/05/2015  . Primary osteoarthritis of left knee 09/22/2014  . Esophageal reflux 07/18/2014  . Hyperlipidemia associated with type 2 diabetes mellitus ([place]) 02/26/2014  . Tobacco use disorder 07/22/2013  . Headache 06/28/2013  . Diabetic peripheral neuropathy associated with type 2 diabetes mellitus ([place]) 06/14/2013  . Hx-TIA (transient ischemic attack) 03/10/2013  . Diabetes mellitus type 2, controlled, with complications ([place]) 69/48/5462  . Hypertension 05/13/2012    Past Surgical History:  Procedure Laterality Date  . 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 ARTHRODESIS METATARSAL     RIGHT 5 TH   . BILATERAL SALPINGOOPHORECTOMY Bilateral 1986   "after hemtoma evacuations; had to cut me open"  . BIOPSY  11/07/2017   Procedure: BIOPSY;  Surgeon: Irene Shipper, MD;  Location[facility] ENDOSCOPY;  Service: Endoscopy;;  . CARPAL TUNNEL RELEASE Bilateral   . COLONOSCOPY     10 + yrs ago- pt unsure- was in [place]- pt states  MD will not send records but colon was normal per pt.   . CYSTOSCOPY W/ URETERAL STENT PLACEMENT Left 09/06/2018   Procedure: CYSTOSCOPY WITH LEFT RETROGRADE PYELOGRAM/URETERAL LEFT DOUBLE J STENT PLACEMENT;  Surgeon: Franchot Gallo, MD;  Location: [facility];  Service: Urology;  Laterality: Left;  . DILATION AND CURETTAGE OF UTERUS  ~ 1982   S/P miscarriage  . ESOPHAGOGASTRODUODENOSCOPY (EGD) WITH PROPOFOL N/A 11/07/2017   Procedure: ESOPHAGOGASTRODUODENOSCOPY (EGD) WITH PROPOFOL;  Surgeon: Irene Shipper, MD;  Location: [facility] ENDOSCOPY;  Service: Endoscopy;  Laterality: N/A;  . EXTRACORPOREAL SHOCK WAVE LITHOTRIPSY Left 09/24/2018   Procedure: EXTRACORPOREAL SHOCK WAVE LITHOTRIPSY (ESWL);  Surgeon: Cleon Gustin, MD;  Location: [facility] ORS;  Service: Urology;  Laterality: Left;  . HEMATOMA EVACUATION Right 1986 x 2   "OVARY; w/in 1 wk after hysterectomy"  . implantable loop recorder placement  05/23/2019   MDT Reveal [facility] [id number]) implanted by Dr Rayann Heman in office for evaluation of syncope  . KNEE ARTHROSCOPY     LEFT  . LAPAROSCOPIC CHOLECYSTECTOMY    . SHOULDER ARTHROSCOPY W/ ROTATOR CUFF REPAIR Bilateral   . TONSILLECTOMY    . TOTAL KNEE ARTHROPLASTY Left 09/30/2016   Procedure: LEFT TOTAL KNEE ARTHROPLASTY;  Surgeon: Netta Cedars, MD;  Location: [facility];  Service: Orthopedics;  Laterality: Left;  . TUBAL LIGATION    . VAGINAL HYSTERECTOMY  1986     OB History   No obstetric history on file.     Family History  Problem Relation Age of Onset  . Hyperlipidemia Mother   . Heart  attack Mother   . Heart attack Father 42  . Hypertension Father   . Cancer Paternal Grandfather        Lung cancer  . Heart attack Paternal Grandfather   . Cancer Paternal Grandmother   . Obesity Daughter        gastric bypass surgery  . Arthritis Sister   . Migraines Daughter   . Chiari malformation Daughter   . Arthritis Daughter   . [place] White syndrome Daughter   . Colon cancer Neg Hx   . Colon polyps Neg Hx   . Esophageal cancer Neg Hx   . Rectal cancer Neg Hx   . Stomach cancer Neg Hx     Social  History   Tobacco Use  . Smoking status: Current Every Day Smoker    Packs/day: 0.30    Years: 49.00    Pack years: 14.70  . Smokeless tobacco: Never Used  . Tobacco comment: 2 cigs in last week   Vaping Use  . Vaping Use: Never used  Substance Use Topics  . Alcohol use: No    Alcohol/week: 0.0 standard drinks  . Drug use: No    Home Medications Prior to Admission medications   Medication Sig Start Date End Date Taking? Authorizing Provider  albuterol (PROVENTIL HFA;VENTOLIN HFA) 108 (90 Base) MCG/ACT inhaler Inhale 1-2 puffs into the lungs every 6 (six) hours as needed for wheezing or shortness of breath. 02/10/17   Lucious Groves, DO  amitriptyline (ELAVIL) 50 MG tablet Take 1 tablet (50 mg total) by mouth at bedtime. 11/27/19   Raulkar, Clide Deutscher, MD  amLODipine (NORVASC) 10 MG tablet TAKE 1 TABLET BY MOUTH EVERY DAY 07/26/19   Lars Mage, MD  aspirin EC 81 MG tablet Take 81 mg by mouth daily.    [provider]  Canagliflozin-metFORMIN HCl ER (INVOKAMET XR) 150-500 MG TB24 Take 2 tablets by mouth daily. Patient not taking: Reported on 11/27/2019 11/26/19 02/24/20  Lars Mage, MD  cetirizine (ZYRTEC) 5 MG tablet Take 1 tablet (5 mg total) by mouth daily. As needed for hand itchiness. 02/01/19   Marianna Payment, MD  fluticasone (FLONASE) 50 MCG/ACT nasal spray Place 2 sprays into both nostrils daily. 09/20/19 09/19/20  Maudie Mercury, MD  gabapentin  (NEURONTIN) 600 MG tablet Take 1.5 tabs three times a day 07/23/19   Cameron Sprang, MD  glucose blood [facility] VERIO) test strip Use as instructed 06/04/19   Welford Roche, MD  HYDROcodone-acetaminophen (NORCO/VICODIN) 5-325 MG tablet Take 1 tablet by mouth every 4 (four) hours as needed. 01/04/20   Isla Pence, MD  Insulin Degludec-Liraglutide (XULTOPHY) 100-3.6 UNIT-MG/ML SOPN Inject 18 Units into the skin daily. 12/11/19   Chundi, Verne Spurr, MD  Insulin Pen Needle (NOVOFINE PLUS) 32G X 4 MM MISC 1 Units by Does not apply route every morning. 10/03/19   Maudie Mercury, MD  Lancets [facility] DELICA PLUS [id number]) MISC 1 each by Does not apply route 2 (two) times daily. 06/04/19   Santos-Sanchez, Merlene Morse, MD  lisinopril (ZESTRIL) 5 MG tablet Take 1 tablet (5 mg total) by mouth daily. 11/29/19   Chundi, Verne Spurr, MD  pantoprazole (PROTONIX) 40 MG tablet TAKE 1 TABLET BY MOUTH TWICE DAILY 12/03/19   Pyrtle, Lajuan Lines, MD  rosuvastatin (CRESTOR) 40 MG tablet TAKE 1 TABLET(40 MG) BY MOUTH DAILY 12/17/19   Darrick Meigs, Rylee, MD  sertraline (ZOLOFT) 100 MG tablet TAKE 1 TABLET(100 MG) BY MOUTH DAILY 09/17/19   Maudie Mercury, MD  tiZANidine (ZANAFLEX) 4 MG tablet TAKE 1 TABLET(4 MG) BY MOUTH EVERY NIGHT AT BEDTIME AS NEEDED FOR MUSCLE SPASMS 12/03/19   Raulkar, Clide Deutscher, MD    Allergies    Flexeril [cyclobenzaprine], Propofol, and Soma [carisoprodol]  Review of Systems   Review of Systems  Musculoskeletal:       Left hip pain  All other systems reviewed and are negative.   Physical Exam Updated Vital Signs BP (!) 130/84 (BP Location: Left Arm)   Pulse 90   Temp 98.9 F (37.2 C) (Oral)   Resp 16   Wt 81.6 kg   SpO2 99%   BMI 29.95 kg/m   Physical Exam Vitals and nursing note reviewed.  Constitutional:  Appearance: Normal appearance.  HENT:     Head: Normocephalic and atraumatic.     Right Ear: External ear normal.     Left Ear: External ear normal.     Nose: Nose normal.      Mouth/Throat:     Mouth: Mucous membranes are moist.     Pharynx: Oropharynx is clear.  Eyes:     Extraocular Movements: Extraocular movements intact.     Conjunctiva/sclera: Conjunctivae normal.     Pupils: Pupils are equal, round, and reactive to light.  Cardiovascular:     Rate and Rhythm: Normal rate and regular rhythm.     Pulses: Normal pulses.     Heart sounds: Normal heart sounds.  Pulmonary:     Effort: Pulmonary effort is normal.     Breath sounds: Normal breath sounds.  Abdominal:     General: Abdomen is flat. Bowel sounds are normal.     Palpations: Abdomen is soft.  Musculoskeletal:     Cervical back: Normal range of motion and neck supple.     Comments: Bruising and tenderness to left lateral hip  Skin:    General: Skin is warm.     Capillary Refill: Capillary refill takes less than 2 seconds.  Neurological:     General: No focal deficit present.     Mental Status: She is alert and oriented to person, place, and time.  Psychiatric:        Mood and Affect: Mood normal.        Behavior: Behavior normal.     ED Results / Procedures / Treatments   Labs (all labs ordered are listed, but only abnormal results are displayed) Labs Reviewed - No data to display  EKG None  Radiology CT Hip Left Wo Contrast  Result Date: 01/04/2020 CLINICAL DATA:  Left hip pain since falling 5 days ago. EXAM: CT OF THE LEFT HIP WITHOUT CONTRAST TECHNIQUE: Multidetector CT imaging of the left hip was performed according to the standard protocol. Multiplanar CT image reconstructions were also generated. COMPARISON:  Radiographs same date.  Pelvic CT 09/25/2018. FINDINGS: Bones/Joint/Cartilage The bones are well mineralized. There is no evidence of acute fracture or dislocation. There is stable mild left hip degenerative changes with acetabular spurring. No significant hip joint effusion. There are mild sacroiliac degenerative changes bilaterally. Lower lumbar spondylosis noted with  asymmetric facet hypertrophy and endplate osteophytes on the right at L5-S1, contributing to chronic right foraminal narrowing. There is at least moderate multifactorial spinal stenosis at L4-5. Ligaments Suboptimally assessed by CT. Muscles and Tendons Unremarkable.  No focal intramuscular hematoma. Soft tissues There is soft tissue stranding in the subcutaneous fat lateral to the left hip which may reflect soft tissue contusion. No focal hematoma or foreign body. No evidence of pelvic hematoma. Mild iliofemoral atherosclerosis noted. IMPRESSION: 1. No evidence of acute left hip fracture or dislocation. 2. Possible soft tissue contusion lateral to the left hip. No focal hematoma or foreign body. 3. Lower lumbar spondylosis with chronic right foraminal narrowing at L5-S1 and at least moderate multifactorial spinal stenosis at L4-5. Electronically Signed   By: Richardean Sale M.D.   On: 01/04/2020 18:25   DG Hip Unilat With Pelvis 2-3 Views Left  Result Date: 01/04/2020 CLINICAL DATA:  [place] on 12/30/2019 and 12/31/2019, outside hip pain on LEFT, hurts but can walk EXAM: DG HIP (WITH OR WITHOUT PELVIS) 2-3V LEFT COMPARISON:  None FINDINGS: Osseous demineralization. Slightly rotated to the RIGHT. Hip and SI joint spaces preserved.  No acute fracture, dislocation, or bone destruction. Degenerative disc disease changes at visualized lumbar spine. IMPRESSION: No acute osseous abnormalities. Degenerative disc disease changes lower lumbar spine. Electronically Signed   By: Lavonia Dana M.D.   On: 01/04/2020 13:55    Procedures Procedures (including critical care time)  Medications Ordered in ED Medications  HYDROcodone-acetaminophen (NORCO/VICODIN) 5-325 MG per tablet 1 tablet (has no administration in time range)  ibuprofen (ADVIL) tablet 600 mg (has no administration in time range)    ED Course  I have reviewed the triage vital signs and the nursing notes.  Pertinent labs & imaging results that were  available during my care of the patient were reviewed by me and considered in my medical decision making (see chart for details).    MDM Rules/Calculators/A&P                          Xray neg.  Due to pain, CT ordered.  CT showed no fx.  Pain has improved with lortab.  Pt is stable for d/c.  Return if worse.  Final Clinical Impression(s) / ED Diagnoses Final diagnoses:  Contusion of left hip, initial encounter    Rx / DC Orders ED Discharge Orders         Ordered    HYDROcodone-acetaminophen (NORCO/VICODIN) 5-325 MG tablet  Every 4 hours PRN     Discontinue  Reprint     01/04/20 1832           Isla Pence, MD 01/04/20 [phone]

## 2020-01-08 ENCOUNTER — Encounter: Payer: Medicare Other | Admitting: Student

## 2020-01-09 ENCOUNTER — Ambulatory Visit (INDEPENDENT_AMBULATORY_CARE_PROVIDER_SITE_OTHER): Payer: Medicare Other | Admitting: Student

## 2020-01-09 ENCOUNTER — Encounter: Payer: Self-pay | Admitting: Student

## 2020-01-09 ENCOUNTER — Other Ambulatory Visit: Payer: Self-pay

## 2020-01-09 DIAGNOSIS — E1142 Type 2 diabetes mellitus with diabetic polyneuropathy: Secondary | ICD-10-CM | POA: Diagnosis not present

## 2020-01-09 DIAGNOSIS — E118 Type 2 diabetes mellitus with unspecified complications: Secondary | ICD-10-CM

## 2020-01-09 DIAGNOSIS — S37019A Minor contusion of unspecified kidney, initial encounter: Secondary | ICD-10-CM

## 2020-01-09 DIAGNOSIS — W19XXXD Unspecified fall, subsequent encounter: Secondary | ICD-10-CM

## 2020-01-09 MED ORDER — GABAPENTIN 600 MG PO TABS: ORAL_TABLET | ORAL | 0 refills | Status: DC

## 2020-01-09 MED ORDER — TIZANIDINE HCL 4 MG PO TABS: ORAL_TABLET | ORAL | 0 refills | Status: DC

## 2020-01-09 MED ORDER — XULTOPHY 100-3.6 UNIT-MG/ML ~~LOC~~ SOPN: 18.0000 [IU] | PEN_INJECTOR | Freq: Every day | SUBCUTANEOUS | 1 refills | Status: DC

## 2020-01-09 NOTE — Patient Instructions (Signed)
It was a pleasure seeing you in clinic.   I have refilled you medication. Please start reduce gabapentin 1 tablet 3 times a day. Exercise safety precautions to reduce risk of falls. Can think about installing safety railing on frequently traveled paths at home.  If you have any questions or concerns, please call our clinic at [phone] between 9am-5pm and after hours call [phone] and ask for the internal medicine resident on call. If you feel you are having a medical emergency please call 911.   Thank you, we look forward to helping you remain healthy!

## 2020-01-09 NOTE — Assessment & Plan Note (Addendum)
States gabapentin is not helping with neuropathy, but has abdominal pain when she does not take it. Likely having withdrawal symptoms when she does not take gabapentin. Will slowly wean off.  Last A1c 8.8 about 1 month ago. Blood glucose reading ranging from 100-200s throughout the day. will continue her on Xultophy 16 units and invokamet 150-500mg  2 tablet daily.    Plan Reduce Gabapentin to 600 mg TID

## 2020-01-09 NOTE — Progress Notes (Signed)
   CC: Follow up for fall  HPI:  [name] is a 69 y.o. female with history documented below presents for follow up of fall causing left hip pain about 2 weeks ago. She was evaluated for fall at [facility] on 01/04/2020 with left hip pain. Xray and CT of left hip negative for acute fracture. Treated with hydrocondone-acteminophen 5-325 mg. Please refer to problem based charting for further details and assessment and plan.   Past Medical History:  Diagnosis Date  . Acute blood loss anemia 09/27/2018  . Allergy   . Anemia   . Arthritis    "left knee" (09/08/2015)  . Asthma   . Blood transfusion without reported diagnosis 10/2017   anemic  . Brachial plexus disorders   . CHF (congestive heart failure) ([place])   . Chronic pain    neck pain, headache, neuropathy  . COPD (chronic obstructive pulmonary disease) ([place])    SEES ONLY DR. PATEL -   . Depression    "when my husband passed in 2013"  . Facial trauma 05/2018  . Facial trauma, initial encounter 06/18/2018  . Fall 04/19/2018  . Gastric ulcer   . GERD (gastroesophageal reflux disease)   . Hemorrhagic gastritis   . History of kidney stones   . Hypertension   . Hypertriglyceridemia   . IDA (iron deficiency anemia)   . Internal hemorrhoids   . Intracranial aneurysm   . Migraine    "monthly" (09/08/2015)  . Neuromuscular disorder (HCC)    neuropathy  . Puncture wound of foot, right 05/17/2012   Tetanus shot 3 yrs ago at [facility] in [place], per pt report   . Stroke ([place])    TIAS    IN [place]   4 YRS AGO   . Type II diabetes mellitus ([place]) 2010   diagnosed around 2010, only ever on metformin   Review of Systems:  Negative as per HPI Physical Exam:  Vitals:   01/09/20 1101  BP: 100/70  Pulse: 92  Temp: 98.3 F (36.8 C)  TempSrc: Oral  SpO2: 96%  Weight: 178 lb (80.7 kg)  Height: 5\' 5"  (1.651 m)   Physical Exam  Constitutional: Appears well-developed and well-nourished. No distress.  HENT:  Head:  Normocephalic and atraumatic.  Eyes: Conjunctivae are normal.  Cardiovascular: Normal rate, regular rhythm and normal heart sounds.  Respiratory: Effort normal and breath sounds normal. No respiratory distress. No wheezes.  GI: Soft. Bowel sounds are normal. No distension. There is no tenderness.  Musculoskeletal: bruising of left hip appears to be healing well Neurological: No focal deficits  Skin: Abrasion to left knee with dried blood around, no active bleeding. Small abrasion between second and third toes on right foot.  Psychiatric: Normal mood and affect. Behavior is normal. Judgment and thought content normal.    Assessment & Plan:   See Encounters Tab for problem based charting.  Patient seen with Dr. Evette Doffing

## 2020-01-09 NOTE — Assessment & Plan Note (Addendum)
Recently seen at [facility] for fall 2 weeks ago and has had 3 more falls since then. States her knees give out when she stands. Denies feeling dizziness, syncope hitting her head. She has multiple risk factors for falls including poor mobility due to cervical and lumbar disc degeneration, diabetic neuropathy, multiple centrally acting medications, deconditioning, and toe amputation. She has not gone to physical therapy yet.  Assessment and plan  -Encourage patient schedule with physical therapy and have fall assessment done -Advised her to use walkers and mobility aid, consider railings along most often used paths -Will reduce gabapentin to 600 mg TID and slowly wean off to reduce centrally acting medications.

## 2020-01-10 LAB — LIPID PANEL
Chol/HDL Ratio: 2.6 ratio (ref 0.0–4.4)
Cholesterol, Total: 100 mg/dL (ref 100–199)
HDL: 39 mg/dL — ABNORMAL LOW (ref >39)
LDL Chol Calc (NIH): 31 mg/dL (ref 0–99)
Triglycerides: 185 mg/dL — ABNORMAL HIGH (ref 0–149)
VLDL Cholesterol Cal: 30 mg/dL (ref 5–40)

## 2020-01-10 NOTE — Progress Notes (Signed)
Internal Medicine Clinic Attending  I saw and evaluated the patient.  I personally confirmed the key portions of the history and exam documented by Dr. Liang and I reviewed pertinent patient test results.  The assessment, diagnosis, and plan were formulated together and I agree with the documentation in the resident's note.  

## 2020-01-13 ENCOUNTER — Ambulatory Visit (INDEPENDENT_AMBULATORY_CARE_PROVIDER_SITE_OTHER): Payer: Medicare Other | Admitting: *Deleted

## 2020-01-13 DIAGNOSIS — R55 Syncope and collapse: Secondary | ICD-10-CM | POA: Diagnosis not present

## 2020-01-14 LAB — CUP PACEART REMOTE DEVICE CHECK
Date Time Interrogation Session: 20210730230337
Implantable Pulse Generator Implant Date: 20201210

## 2020-01-15 NOTE — Progress Notes (Signed)
Carelink Summary Report / Loop Recorder 

## 2020-01-16 ENCOUNTER — Ambulatory Visit: Payer: Medicare Other | Admitting: Podiatry

## 2020-01-16 ENCOUNTER — Telehealth: Payer: Self-pay | Admitting: *Deleted

## 2020-01-16 NOTE — Telephone Encounter (Signed)
Thank you. Agree with appointment, please schedule when able.

## 2020-01-16 NOTE — Telephone Encounter (Signed)
appt 8/6 at 1315

## 2020-01-16 NOTE — Telephone Encounter (Signed)
Pt rtn call and states she was disconnected.  Please call back to [phone].

## 2020-01-16 NOTE — Telephone Encounter (Signed)
rtc to pt, lm for rtc 

## 2020-01-16 NOTE — Telephone Encounter (Signed)
Patient called in to let us know she has fallen 3 times since she was last in clinic on 01/09/2020. First fall she felt like she had "heat stroke." 90+ degrees outside and she was checking into hotel, legs felt "wobbly," sat down on floor so she wouldn't fall, then fell walking into room. States she has a walker but it won't help, "It'll just go down with me."   C/o bilateral hip soreness, and 3 new skin tears on arms. States skin tear on left shin that was dressed last week is doing well. Offered appt but phone became disconnected. No answer on return call. Left message on VM requesting return call. Hubbard Hartshorn, RN, BSN

## 2020-01-17 ENCOUNTER — Ambulatory Visit (INDEPENDENT_AMBULATORY_CARE_PROVIDER_SITE_OTHER): Payer: Medicare Other | Admitting: Internal Medicine

## 2020-01-17 ENCOUNTER — Encounter: Payer: Self-pay | Admitting: Internal Medicine

## 2020-01-17 ENCOUNTER — Other Ambulatory Visit: Payer: Self-pay

## 2020-01-17 VITALS — BP 135/76 | HR 100 | Temp 98.2°F | Ht 66.0 in | Wt 183.0 lb

## 2020-01-17 DIAGNOSIS — E118 Type 2 diabetes mellitus with unspecified complications: Secondary | ICD-10-CM | POA: Diagnosis not present

## 2020-01-17 DIAGNOSIS — W19XXXD Unspecified fall, subsequent encounter: Secondary | ICD-10-CM

## 2020-01-17 DIAGNOSIS — E1142 Type 2 diabetes mellitus with diabetic polyneuropathy: Secondary | ICD-10-CM

## 2020-01-17 MED ORDER — XULTOPHY 100-3.6 UNIT-MG/ML ~~LOC~~ SOPN: 18.0000 [IU] | PEN_INJECTOR | Freq: Every day | SUBCUTANEOUS | 2 refills | Status: DC

## 2020-01-17 MED ORDER — GABAPENTIN 400 MG PO CAPS: 400.0000 mg | ORAL_CAPSULE | Freq: Three times a day (TID) | ORAL | 0 refills | Status: DC

## 2020-01-17 NOTE — Patient Instructions (Addendum)
I adjust some of your medications that may make you drowsy and cause more falls.  -Please stop taking tizanidine.  -Decrease gabapentin to 400 mg 3 times daily (I sent the prescription for 400 mg tablets.  Stop taking 600 mg tablets that you had before and start taking 400 mg tablets instead) -Take rest of your medications as before. I send refill to your pharmacy for your Xultophy.   Please use cane/walker to minimize falling episodes. I sent a refill to chronic care management service.  They will see if there is any option to provide you transportation to make it to physical therapy.  It is important for you to be seen by a physical therapist.  Please come back to clinic in 1-2 month or earlier if your symptoms get worse or not improved. As always, if having severe symptoms, please seek medical attention at emergency room. Should you have any questions or concerns please call the internal medicine clinic at [phone].    Thank you!

## 2020-01-17 NOTE — Assessment & Plan Note (Signed)
Patient is here for follow-up.  She had few more following since last visit.  Mentions that all of lumbar without prodromal symptoms, and where due to feeling weak on her knees after standing for long time. It looks like that her frequent falls have been multifactorial, due to taking several central acting medication and also gait problem/weakness.  Started to taper down her gabapentin last visit and also recommended to be evaluated by PT.  She mentions that she is doing well on lower dose of gabapentin and agrees to tapering that down further, but was not able to see physical therapist.  She lives in a hotel and does not have any transportation so she was not able to make it to her PT appointment.  We discussed the plan for tapering down her centrally acting medication and referring her to [facility] for further assistant and hopefully can arrange some transportation for her as well.  Recommended to use cane or walker meanwhile.  -CCM referral placed. Appreciate their care -Stop Tizanidine -Taper down Gabapentin from 600 mg TID to 400 mg TID

## 2020-01-17 NOTE — Assessment & Plan Note (Signed)
BG at home ranging 160-180 per patient. On Xultophy 18 units and invokamet 150-500mg  2 tablet daily.  Last Hb A1c: 8.8 (12/11/2019). Improved from 10.9<11.2.   -Will continue current regimen (no change in tx per patient's preference   -Repeat A1c in 2 months

## 2020-01-17 NOTE — Progress Notes (Signed)
   CC: f/u for frequent fall  HPI:  [name] is a 69 y.o. female with PMHx as documented below, presented for follow up of frequent falls. Please refer to problem based charting for further details and assessment and plan of current problem and chronic medical conditions.  PMHx: HTN, migraine headache, type II DM, HLD, peripheral neuropathy, frequent fall  Medications: Albuterol as needed, amitriptyline, sertraline, tizanidine, amlodipine 10, aspirin, Invokamet (canagliflozin-Metformin ER) [phone] mg daily, gabapentin 600 mg 3 times daily, Norco?,  Insulin degludec-liraglutide (Xultophy) 18 units daily start date 7/29, lisinopril 5 mg daily, Protonix 40 mg twice daily, Crestor 40 mg daily  Past Medical History:  Diagnosis Date  . Acute blood loss anemia 09/27/2018  . Allergy   . Anemia   . Arthritis    "left knee" (09/08/2015)  . Asthma   . Blood transfusion without reported diagnosis 10/2017   anemic  . Brachial plexus disorders   . CHF (congestive heart failure) ([place])   . Chronic pain    neck pain, headache, neuropathy  . COPD (chronic obstructive pulmonary disease) ([place])    SEES ONLY DR. PATEL -   . Depression    "when my husband passed in 2013"  . Facial trauma 05/2018  . Facial trauma, initial encounter 06/18/2018  . Fall 04/19/2018  . Gastric ulcer   . GERD (gastroesophageal reflux disease)   . Hemorrhagic gastritis   . History of kidney stones   . Hypertension   . Hypertriglyceridemia   . IDA (iron deficiency anemia)   . Internal hemorrhoids   . Intracranial aneurysm   . Migraine    "monthly" (09/08/2015)  . Neuromuscular disorder (HCC)    neuropathy  . Puncture wound of foot, right 05/17/2012   Tetanus shot 3 yrs ago at [facility] in [place], per pt report   . Stroke ([place])    TIAS    IN [place]   4 YRS AGO   . Type II diabetes mellitus ([place]) 2010   diagnosed around 2010, only ever on metformin   Review of Systems:   Review of Systems   Constitutional: Negative for chills and fever.  Musculoskeletal: Positive for falls.  Neurological: Positive for weakness. Negative for dizziness and headaches.    Physical Exam:  Vitals:   01/17/20 1313  BP: 135/76  Pulse: 100  Temp: 98.2 F (36.8 C)  TempSrc: Oral  SpO2: 98%  Weight: 183 lb (83 kg)  Height: 5\' 6"  (1.676 m)   Constitutional: No acute distress.  Eyes: Conjunctivae are normal, EOM nl Cardiovascular: RRR, nl S1S2, no murmur,  no LEE Respiratory: Effort normal and breath sounds normal. No respiratory distress. No wheezes.  GI: Soft. Bowel sounds are normal. No distension. There is no tenderness.  Neurological: Is alert and oriented x 3, neurologic exam at baseline (mild rt LE weakness 4/5. Per patient,  this is her baseline), nl heel to shin and finger to nose Skin: Not diaphoretic. No erythema.  Psychiatric: Normal mood and affect. Behavior is normal. Judgment and thought content normal.   Assessment & Plan:   See Encounters Tab for problem based charting.  Patient discussed with Dr. Dareen Piano

## 2020-01-17 NOTE — Assessment & Plan Note (Signed)
Pt symptoms are controlled while tapering down gabapentin. Reducing Gabapentin to 400 mg TID. (due to multiple falls and concern for being on several centrally acting medications).  -Decrease Gabapentin dose to 400 mg TID

## 2020-01-18 ENCOUNTER — Telehealth: Payer: Self-pay | Admitting: Internal Medicine

## 2020-01-18 DIAGNOSIS — E118 Type 2 diabetes mellitus with unspecified complications: Secondary | ICD-10-CM

## 2020-01-18 MED ORDER — XULTOPHY 100-3.6 UNIT-MG/ML ~~LOC~~ SOPN: 18.0000 [IU] | PEN_INJECTOR | Freq: Every day | SUBCUTANEOUS | 2 refills | Status: DC

## 2020-01-18 NOTE — Telephone Encounter (Signed)
[name] was unable to pick her prescription for xultophy up at the pharmacy. She has a recent increase in dose and the prescription was written for later this month. I sent in a prescription to her pharmacy for xultophy.

## 2020-01-20 ENCOUNTER — Telehealth: Payer: Self-pay | Admitting: Student

## 2020-01-20 NOTE — Chronic Care Management (AMB) (Signed)
  Chronic Care Management   Note  01/20/2020 Name: [name] MRN: [id number] DOB: 11-Jul-1950  [name] is a 69 y.o. year old female who is a primary care patient of [name], MD. I reached out to [name] by phone today in response to a referral sent by [name]'s PCP, Dr. Lisabeth Devoid      [name] was given information about Chronic Care Management services today including:  1. CCM service includes personalized support from designated clinical staff supervised by her physician, including individualized plan of care and coordination with other care providers 2. 24/7 contact phone numbers for assistance for urgent and routine care needs. 3. Service will only be billed when office clinical staff spend 20 minutes or more in a month to coordinate care. 4. Only one practitioner may furnish and bill the service in a calendar month. 5. The patient may stop CCM services at any time (effective at the end of the month) by phone call to the office staff. 6. The patient will be responsible for cost sharing (co-pay) of up to 20% of the service fee (after annual deductible is met).  Patient agreed to services and verbal consent obtained.   Follow up plan: Telephone appointment with care management team member scheduled for: BSW 01/21/2020 and  RN CM 01/22/2020  Noreene Larsson, [place], [place], [place] [address] Direct Dial: [phone] Devanshi [email or web address] Website: [email or web address]

## 2020-01-21 ENCOUNTER — Ambulatory Visit: Payer: Medicare Other | Admitting: *Deleted

## 2020-01-21 ENCOUNTER — Ambulatory Visit: Payer: Medicare Other

## 2020-01-21 DIAGNOSIS — I10 Essential (primary) hypertension: Secondary | ICD-10-CM

## 2020-01-21 DIAGNOSIS — E118 Type 2 diabetes mellitus with unspecified complications: Secondary | ICD-10-CM

## 2020-01-21 NOTE — Patient Instructions (Signed)
Visit Information  Goals Addressed              This Visit's Progress     Patient Stated   .  Patient told the BSW " I have nowhere to live." (pt-stated)        [place] (see longitudinal plan of care for additional care plan information)   Current Barriers:  . Chronic Disease Management support, education, and care coordination needs related to HTN, HLD, DMII, Depression, and Homelessness  Case Manager Clinical Goal(s):  Marland Kitchen Over the next 180 days, patient will work with BSW to address needs related to Housing barriers in patient with HTN, HLD, DMII, Depression, and Homelessness  Interventions:  . Collaborated with BSW to initiate plan of care to address needs related to Housing barriers in patient with HTN, HLD, DMII, Depression, and Homelessness  Patient Self Care Activities:  . Patient verbalizes understanding of plan to work with CCM team to assist with housing options . Self administers medications as prescribed . Attends all scheduled provider appointments . Calls pharmacy for medication refills . Performs ADL's independently . Performs IADL's independently . Calls provider office for new concerns or questions  Initial goal documentation        Ms. Ragas was given information about Chronic Care Management services today including:  1. CCM service includes personalized support from designated clinical staff supervised by her physician, including individualized plan of care and coordination with other care providers 2. 24/7 contact phone numbers for assistance for urgent and routine care needs. 3. Service will only be billed when office clinical staff spend 20 minutes or more in a month to coordinate care. 4. Only one practitioner may furnish and bill the service in a calendar month. 5. The patient may stop CCM services at any time (effective at the end of the month) by phone call to the office staff. 6. The patient will be responsible for cost sharing  (co-pay) of up to 20% of the service fee (after annual deductible is met).  Patient agreed to services and verbal consent obtained.   The patient verbalized understanding of instructions provided today and declined a print copy of patient instruction materials.   The care management team will reach out to the patient again over the next 1-2 days.   Kelli Churn RN, CCM, [facility] RN Care Manager [phone]

## 2020-01-21 NOTE — Chronic Care Management (AMB) (Signed)
°Chronic Care Management  ° °Initial Visit Note ° °01/21/2020 °Name: [name] MRN: [id number] DOB: 04/01/1951 ° °Referred by: Liang, Jessica, MD °Reason for referral : Care Coordination (IDDM,  HLD, HTN) ° ° °[name] is a 69 y.o. year old female who is a primary care patient of Liang, Jessica, MD. The CCM team was consulted for assistance with chronic disease management and care coordination needs related to HTN, HLD, DMII, Depression and Homelessness ° °Review of patient status, including review of consultants reports, relevant laboratory and other test results, and collaboration with appropriate care team members and the patient's provider was performed as part of comprehensive patient evaluation and provision of chronic care management services.   ° °SDOH (Social Determinants of Health) assessments performed: No °See Care Plan activities for detailed interventions related to SDOH  °  ° °Medications: °Outpatient Encounter Medications as of 01/21/2020  °Medication Sig  °• albuterol (PROVENTIL HFA;VENTOLIN HFA) 108 (90 Base) MCG/ACT inhaler Inhale 1-2 puffs into the lungs every 6 (six) hours as needed for wheezing or shortness of breath.  °• amitriptyline (ELAVIL) 50 MG tablet Take 1 tablet (50 mg total) by mouth at bedtime.  °• amLODipine (NORVASC) 10 MG tablet TAKE 1 TABLET BY MOUTH EVERY DAY  °• aspirin EC 81 MG tablet Take 81 mg by mouth daily.  °• Canagliflozin-metFORMIN HCl ER (INVOKAMET XR) 150-500 MG TB24 Take 2 tablets by mouth daily. (Patient not taking: Reported on 11/27/2019)  °• cetirizine (ZYRTEC) 5 MG tablet Take 1 tablet (5 mg total) by mouth daily. As needed for hand itchiness.  °• fluticasone (FLONASE) 50 MCG/ACT nasal spray Place 2 sprays into both nostrils daily.  °• gabapentin (NEURONTIN) 400 MG capsule Take 1 capsule (400 mg total) by mouth 3 (three) times daily.  °• glucose blood (ONETOUCH VERIO) test strip Use as instructed  °• HYDROcodone-acetaminophen (NORCO/VICODIN)  5-325 MG tablet Take 1 tablet by mouth every 4 (four) hours as needed.  °• Insulin Degludec-Liraglutide (XULTOPHY) 100-3.6 UNIT-MG/ML SOPN Inject 18 Units into the skin daily.  °• Insulin Pen Needle (NOVOFINE PLUS) 32G X 4 MM MISC 1 Units by Does not apply route every morning.  °• Lancets (ONETOUCH DELICA PLUS LANCET33G) MISC 1 each by Does not apply route 2 (two) times daily.  °• lisinopril (ZESTRIL) 5 MG tablet Take 1 tablet (5 mg total) by mouth daily.  °• pantoprazole (PROTONIX) 40 MG tablet TAKE 1 TABLET BY MOUTH TWICE DAILY  °• rosuvastatin (CRESTOR) 40 MG tablet TAKE 1 TABLET(40 MG) BY MOUTH DAILY  °• sertraline (ZOLOFT) 100 MG tablet TAKE 1 TABLET(100 MG) BY MOUTH DAILY  ° °No facility-administered encounter medications on file as of 01/21/2020.  °  ° °Objective:  °Lab Results  °Component Value Date  ° HGBA1C 8.8 (A) 12/11/2019  ° HGBA1C 10.9 (A) 09/17/2019  ° HGBA1C 11.2 (A) 06/04/2019  ° °Lab Results  °Component Value Date  ° MICROALBUR 1.9 07/18/2014  ° LDLCALC 31 01/09/2020  ° CREATININE 1.20 (H) 11/26/2019  ° °Lab Results  °Component Value Date  ° CHOL 100 01/09/2020  ° HDL 39 (L) 01/09/2020  ° LDLCALC 31 01/09/2020  ° LDLDIRECT 178 (H) 01/27/2014  ° TRIG 185 (H) 01/09/2020  ° CHOLHDL 2.6 01/09/2020  ° ° °Goals Addressed   °  °  °  °  °  ° This Visit's Progress  °  ° Patient Stated  ° •  Patient told the BSW " I have nowhere to live." (pt-stated)     °     CARE PLAN ENTRY °(see longitudinal plan of care for additional care plan information) ° ° °Current Barriers:  °• Chronic Disease Management support, education, and care coordination needs related to HTN, HLD, DMII, Depression, and Homelessness ° °Case Manager Clinical Goal(s):  °• Over the next 180 days, patient will work with BSW to address needs related to Housing barriers in patient with HTN, HLD, DMII, Depression, and Homelessness ° °Interventions:  °• Collaborated with BSW to initiate plan of care to address needs related to Housing barriers in  patient with HTN, HLD, DMII, Depression, and Homelessness ° °Patient Self Care Activities:  °• Patient verbalizes understanding of plan to work with CCM team to assist with housing options °• Self administers medications as prescribed °• Attends all scheduled provider appointments °• Calls pharmacy for medication refills °• Performs ADL's independently °• Performs IADL's independently °• Calls provider office for new concerns or questions ° °Initial goal documentation  °  °  °  ° °[name] was given information about Chronic Care Management services today including:  °1. CCM service includes personalized support from designated clinical staff supervised by her physician, including individualized plan of care and coordination with other care providers °2. 24/7 contact phone numbers for assistance for urgent and routine care needs. °3. Service will only be billed when office clinical staff spend 20 minutes or more in a month to coordinate care. °4. Only one practitioner may furnish and bill the service in a calendar month. °5. The patient may stop CCM services at any time (effective at the end of the month) by phone call to the office staff. °6. The patient will be responsible for cost sharing (co-pay) of up to 20% of the service fee (after annual deductible is met). ° °Patient agreed to services and verbal consent obtained.  ° °Plan:  °Telephone follow up appointment with care management team member scheduled for:01/22/20  At 12:00 noon ° °Janet Hauser RN, CCM, [facility]ES °[facility] RN Care Manager °[phone] ° ° °

## 2020-01-22 ENCOUNTER — Ambulatory Visit: Payer: Medicare Other

## 2020-01-22 ENCOUNTER — Telehealth: Payer: Self-pay | Admitting: *Deleted

## 2020-01-22 ENCOUNTER — Telehealth: Payer: Medicare Other

## 2020-01-22 DIAGNOSIS — E118 Type 2 diabetes mellitus with unspecified complications: Secondary | ICD-10-CM

## 2020-01-22 DIAGNOSIS — I10 Essential (primary) hypertension: Secondary | ICD-10-CM

## 2020-01-22 NOTE — Patient Instructions (Signed)
Visit Information  Goals Addressed              This Visit's Progress   .  "We have been evicted from our house and have nowhere to live" (pt-stated)        Current Barriers:  Marland Kitchen Knowledge Barriers related to resources available to address need for emergency housing.  Patient reports that she, granddaughter, granddaughter's fiance, and great grandson were all living in a rental house.  Patient reports that granddaughter has significant mental health challenges which resulted in police being called multiple times.  States that neighbors also called the city to complain about condition of yard.  These factors resulted in them being evicted.  They have stayed in a motel for the last two weeks but patient's monthly income has been exhausted.  Family is assisting patient today with moving remaining belongings from home as all belongings have to be out by the end of the day.  Will be officially homeless as of tomorrow morning.  Per patient, Granddaughter is unable to work due to mental and physical health challenges.  Her fiance has part time employment at [employer].  Patient does not qualify for food stamps but granddaughter and her fiance do receive them. Patient's monthly income is $1258.    Case Manager Clinical Goal(s):  Marland Kitchen Over the next 180 days, patient will work with BSW to address needs related to Housing barriers . Over the next 180 days, BSW will collaborate with RN Care Manager to address care management and care coordination needs  Interventions:  . Submitted referral for emergency housing to [employer] . Collaborated with [employer], Armida Sans, regarding referral.  She has been in communication with [facility] at [facility] about referral for patient .   Patient Self Care Activities:  . Self administers medications as prescribed .   Initial goal documentation and Please see past updates related to this goal by clicking on the "Past Updates" button in the  selected goal          Attempted to contact patient for follow up but had to leave voicemail message.  Will try again tomorrow if no return call.      [name], [place] Coordination Social Worker [place] [phone]

## 2020-01-22 NOTE — Chronic Care Management (AMB) (Signed)
Chronic Care Management    Clinical Social Work General Note  01/22/2020 Name: [name] MRN: [id number] DOB: 1951/04/24  [name] is a 69 y.o. year old female who is a primary care patient of [name], MD. The CCM was consulted to assist the patient with disease management, community resources housing.   [name] was given information about Chronic Care Management services today including:  1. CCM service includes personalized support from designated clinical staff supervised by her physician, including individualized plan of care and coordination with other care providers 2. 24/7 contact phone numbers for assistance for urgent and routine care needs. 3. Service will only be billed when office clinical staff spend 20 minutes or more in a month to coordinate care. 4. Only one practitioner may furnish and bill the service in a calendar month. 5. The patient may stop CCM services at any time (effective at the end of the month) by phone call to the office staff. 6. The patient will be responsible for cost sharing (co-pay) of up to 20% of the service fee (after annual deductible is met).  Patient agreed to services and verbal consent obtained.   Review of patient status, including review of consultants reports, relevant laboratory and other test results, and collaboration with appropriate care team members and the patient's provider was performed as part of comprehensive patient evaluation and provision of chronic care management services.    SDOH (Social Determinants of Health) assessments and interventions performed:  Yes SDOH Interventions     Most Recent Value  SDOH Interventions  Housing Interventions [id number] Referral       Outpatient Encounter Medications as of 01/21/2020  Medication Sig   albuterol (PROVENTIL HFA;VENTOLIN HFA) 108 (90 Base) MCG/ACT inhaler Inhale 1-2 puffs into the lungs every 6 (six) hours as needed for wheezing or shortness of  breath.   amitriptyline (ELAVIL) 50 MG tablet Take 1 tablet (50 mg total) by mouth at bedtime.   amLODipine (NORVASC) 10 MG tablet TAKE 1 TABLET BY MOUTH EVERY DAY   aspirin EC 81 MG tablet Take 81 mg by mouth daily.   Canagliflozin-metFORMIN HCl ER (INVOKAMET XR) 150-500 MG TB24 Take 2 tablets by mouth daily. (Patient not taking: Reported on 11/27/2019)   cetirizine (ZYRTEC) 5 MG tablet Take 1 tablet (5 mg total) by mouth daily. As needed for hand itchiness.   fluticasone (FLONASE) 50 MCG/ACT nasal spray Place 2 sprays into both nostrils daily.   gabapentin (NEURONTIN) 400 MG capsule Take 1 capsule (400 mg total) by mouth 3 (three) times daily.   glucose blood (ONETOUCH VERIO) test strip Use as instructed   HYDROcodone-acetaminophen (NORCO/VICODIN) 5-325 MG tablet Take 1 tablet by mouth every 4 (four) hours as needed.   Insulin Degludec-Liraglutide (XULTOPHY) 100-3.6 UNIT-MG/ML SOPN Inject 18 Units into the skin daily.   Insulin Pen Needle (NOVOFINE PLUS) 32G X 4 MM MISC 1 Units by Does not apply route every morning.   Lancets (ONETOUCH DELICA PLUS BTDHRC16L) MISC 1 each by Does not apply route 2 (two) times daily.   lisinopril (ZESTRIL) 5 MG tablet Take 1 tablet (5 mg total) by mouth daily.   pantoprazole (PROTONIX) 40 MG tablet TAKE 1 TABLET BY MOUTH TWICE DAILY   rosuvastatin (CRESTOR) 40 MG tablet TAKE 1 TABLET(40 MG) BY MOUTH DAILY   sertraline (ZOLOFT) 100 MG tablet TAKE 1 TABLET(100 MG) BY MOUTH DAILY   No facility-administered encounter medications on file as of 01/21/2020.    Goals Addressed  This Visit's Progress     "We have been evicted from our house and have nowhere to live" (pt-stated)        Current Barriers:   Knowledge Barriers related to resources available to address need for emergency housing.  Patient reports that she, granddaughter, granddaughter's fiance, and great grandson were all living in a rental house.  Patient reports that  granddaughter has significant mental health challenges which resulted in police being called multiple times.  States that neighbors also called the city to complain about condition of yard.  These factors resulted in them being evicted.  They have stayed in a motel for the last two weeks but patient's monthly income has been exhausted.  Family is assisting patient today with moving remaining belongings from home as all belongings have to be out by the end of the day.  Will be officially homeless as of tomorrow morning.  Per patient, Granddaughter is unable to work due to mental and physical health challenges.  Her fiance has part time employment at [employer].  Patient does not qualify for food stamps but granddaughter and her fiance do receive them. Patient's monthly income is $1258.    Case Manager Clinical Goal(s):   Over the next 180 days, patient will work with BSW to address needs related to Housing barriers  Over the next 180 days, BSW will collaborate with Consulting civil engineer to address care management and care coordination needs  Interventions:   Patient interviewed and appropriate assessments performed  Informed patient of [place] contract with [employer] industrial/product for emergency housing  [place] with [place], Armida Sans, regarding patient's need for emergency and permanent housing  Provided [name] with requested information for purpose of collaboration with [facility] to submit referral to [facility] via [facility] 360 platform tomorrow as referrals but be submitted between the hours of 8:00 AM and 1:00 PM.  Collaborated with [place] and patient to establish an individualized plan of care     Patient Self Care Activities:   Self administers medications as prescribed    Initial goal documentation         Follow Up Plan: SW will follow up with patient by phone on 01/22/20          [name], [place]  Worker [place] [phone]

## 2020-01-22 NOTE — Progress Notes (Signed)
Internal Medicine Clinic Resident  I have personally reviewed this encounter including the documentation in this note and/or discussed this patient with the care management provider. I will address any urgent items identified by the care management provider and will communicate my actions to the patient's PCP. I have reviewed the patient's CCM visit with my supervising attending, Dr Heber [place].  Jose Persia, MD 01/22/2020

## 2020-01-22 NOTE — Chronic Care Management (AMB) (Signed)
  Care Management   Follow Up Note   01/22/2020 Name: Ramisa Duman MRN: [id number] DOB: 27-Feb-1951  Referred by: [name], MD Reason for referral : Care Coordination (Housing)   Clint Strupp is a 69 y.o. year old female who is a primary care patient of [name], MD. The care management team was consulted for assistance with care management and care coordination needs.    Review of patient status, including review of consultants reports, relevant laboratory and other test results, and collaboration with appropriate care team members and the patient's provider was performed as part of comprehensive patient evaluation and provision of chronic care management services.    SDOH (Social Determinants of Health) assessments performed: No See Care Plan activities for detailed interventions related to [facility])     Advanced Directives: See Care Plan and Vynca application for related entries.   Goals Addressed              This Visit's Progress   .  "We have been evicted from our house and have nowhere to live" (pt-stated)        Current Barriers:  Marland Kitchen Knowledge Barriers related to resources available to address need for emergency housing.  Patient reports that she, granddaughter, granddaughter's fiance, and great grandson were all living in a rental house.  Patient reports that granddaughter has significant mental health challenges which resulted in police being called multiple times.  States that neighbors also called the city to complain about condition of yard.  These factors resulted in them being evicted.  They have stayed in a motel for the last two weeks but patient's monthly income has been exhausted.  Family is assisting patient today with moving remaining belongings from home as all belongings have to be out by the end of the day.  Will be officially homeless as of tomorrow morning.  Per patient, Granddaughter is unable to work due to mental and physical health  challenges.  Her fiance has part time employment at [employer].  Patient does not qualify for food stamps but granddaughter and her fiance do receive them. Patient's monthly income is $1258.    Case Manager Clinical Goal(s):  Marland Kitchen Over the next 180 days, patient will work with BSW to address needs related to Housing barriers . Over the next 180 days, BSW will collaborate with RN Care Manager to address care management and care coordination needs  Interventions:  . Submitted referral for emergency housing to [employer] . Collaborated with [employer], Armida Sans, regarding referral.  She has been in communication with Benita at [facility] about referral for patient .   Patient Self Care Activities:  . Self administers medications as prescribed .   Initial goal documentation and Please see past updates related to this goal by clicking on the "Past Updates" button in the selected goal          Attempted to contact patient for follow up but had to leave voicemail message.  Will try again tomorrow if no return call.       [name], [place] Coordination Social Worker [place] [phone]

## 2020-01-22 NOTE — Telephone Encounter (Signed)
°  Chronic Care Management   Outreach Note  01/22/2020 Name: [name] MRN: [id number] DOB: [birth year]  Referred by: [name], MD Reason for referral : Chronic Care Management (IDDM, HTN, HLD)   An unsuccessful telephone outreach was attempted today to complete initial intake assessment. The patient was referred to the case management team for assistance with care management and care coordination.   Follow Up Plan: A HIPPA compliant phone message was left for the patient providing contact information and requesting a return call.  The care management team will reach out to the patient again over the next 7-10 days.   Kelli Churn RN, CCM, [facility] RN Care Manager [phone]

## 2020-01-22 NOTE — Patient Instructions (Signed)
Visit Information  Goals Addressed              This Visit's Progress   .  "We have been evicted from our house and have nowhere to live" (pt-stated)        Current Barriers:  Marland Kitchen Knowledge Barriers related to resources available to address need for emergency housing.  Patient reports that she, granddaughter, granddaughter's fiance, and great grandson were all living in a rental house.  Patient reports that granddaughter has significant mental health challenges which resulted in police being called multiple times.  States that neighbors also called the city to complain about condition of yard.  These factors resulted in them being evicted.  They have stayed in a motel for the last two weeks but patient's monthly income has been exhausted.  Family is assisting patient today with moving remaining belongings from home as all belongings have to be out by the end of the day.  Will be officially homeless as of tomorrow morning.  Per patient, Granddaughter is unable to work due to mental and physical health challenges.  Her fiance has part time employment at [employer].  Patient does not qualify for food stamps but granddaughter and her fiance do receive them. Patient's monthly income is $1258.    Case Manager Clinical Goal(s):  Marland Kitchen Over the next 180 days, patient will work with BSW to address needs related to Housing barriers . Over the next 180 days, BSW will collaborate with RN Care Manager to address care management and care coordination needs  Interventions:  . Patient interviewed and appropriate assessments performed . Informed patient of [place] contract with [employer] for emergency housing . Collaborated with [facility] [place], Armida Sans, regarding patient's need for emergency and permanent housing . Provided [name] with requested information for purpose of collaboration with [facility] . Plan to submit referral to [facility] via [facility] 360 platform tomorrow as referrals but  be submitted between the hours of 8:00 AM and 1:00 PM. . Collaborated with RN Care Manager and patient to establish an individualized plan of care  .   Patient Self Care Activities:  . Self administers medications as prescribed .   Initial goal documentation        Ms. Karger was given information about Care Management services today including:  1. Care Management services include personalized support from designated clinical staff supervised by her physician, including individualized plan of care and coordination with other care providers 2. 24/7 contact phone numbers for assistance for urgent and routine care needs. 3. The patient may stop CCM services at any time (effective at the end of the month) by phone call to the office staff.  Patient agreed to services and verbal consent obtained.   Patient verbalizes understanding of instructions provided today.   Telephone follow up appointment with care management team member scheduled for:01/22/20    [name], [place] Coordination Social Worker [place] [phone]

## 2020-01-23 ENCOUNTER — Ambulatory Visit: Payer: Medicare Other

## 2020-01-23 DIAGNOSIS — I10 Essential (primary) hypertension: Secondary | ICD-10-CM

## 2020-01-23 DIAGNOSIS — E118 Type 2 diabetes mellitus with unspecified complications: Secondary | ICD-10-CM

## 2020-01-23 NOTE — Patient Instructions (Signed)
Visit Information  Goals Addressed              This Visit's Progress   .  "We have been evicted from our house and have nowhere to live" (pt-stated)        Current Barriers:  Marland Kitchen Knowledge Barriers related to resources available to address need for emergency housing.  Patient reports that she, granddaughter, granddaughter's fiance, and great grandson were all living in a rental house.  Patient reports that granddaughter has significant mental health challenges which resulted in police being called multiple times.  States that neighbors also called the city to complain about condition of yard.  These factors resulted in them being evicted.  They have stayed in a motel for the last two weeks but patient's monthly income has been exhausted.  Family is assisting patient today with moving remaining belongings from home as all belongings have to be out by the end of the day.  Will be officially homeless as of tomorrow morning.  Per patient, Granddaughter is unable to work due to mental and physical health challenges.  Her fiance has part time employment at [employer].  Patient does not qualify for food stamps but granddaughter and her fiance do receive them. Patient's monthly income is $1258.   . 01/23/20:  Patient reports that granddaughter is part of a "parenting group" that paid for them to reside in a motel until tomorrow morning.  This same group has agreed to assist with first month of rent and deposit if they locate new rental property.  Patient states that she and granddaughter are actively searching rental properties.   Case Manager Clinical Goal(s):  Marland Kitchen Over the next 180 days, patient will work with BSW to address needs related to Housing barriers . Over the next 180 days, BSW will collaborate with RN Care Manager to address care management and care coordination needs  Interventions:  . Informed patient that referral was submitted to [facility] and encouraged her to be looking out  for phone call  . Encouraged patient/granddaughter to utilize [employer] for housing options . Offered to take food bags to patient/family that were donated to [facility]; patient declined at this time . [place], [place], to inform her that patient declined food bags and has not yet been contacted by [facility] staff regarding referral for emergency housing.  Patient Self Care Activities:  . Self administers medications as prescribed .   Please see past updates related to this goal by clicking on the "Past Updates" button in the selected goal         Patient verbalizes understanding of instructions provided today.   The patient has been provided with contact information for the care management team and has been advised to call with any health related questions or concerns.       [name], [place] Coordination Social Worker [place] [phone]

## 2020-01-23 NOTE — Chronic Care Management (AMB) (Signed)
Care Management   Follow Up Note   01/23/2020 Name: [name] MRN: [id number] DOB: [birth year]  Referred by: [name], MD Reason for referral : Care Coordination (Housing)   [name] is a 69 y.o. year old female who is a primary care patient of [name], MD. The care management team was consulted for assistance with care management and care coordination needs.    Review of patient status, including review of consultants reports, relevant laboratory and other test results, and collaboration with appropriate care team members and the patient's provider was performed as part of comprehensive patient evaluation and provision of chronic care management services.    SDOH (Social Determinants of Health) assessments performed: No See Care Plan activities for detailed interventions related to [facility])     Advanced Directives: See Care Plan and Vynca application for related entries.   Goals Addressed              This Visit's Progress     "We have been evicted from our house and have nowhere to live" (pt-stated)        Current Barriers:   Knowledge Barriers related to resources available to address need for emergency housing.  Patient reports that she, granddaughter, granddaughter's fiance, and great grandson were all living in a rental house.  Patient reports that granddaughter has significant mental health challenges which resulted in police being called multiple times.  States that neighbors also called the city to complain about condition of yard.  These factors resulted in them being evicted.  They have stayed in a motel for the last two weeks but patient's monthly income has been exhausted.  Family is assisting patient today with moving remaining belongings from home as all belongings have to be out by the end of the day.  Will be officially homeless as of tomorrow morning.  Per patient, Granddaughter is unable to work due to mental and physical health  challenges.  Her fiance has part time employment at [employer].  Patient does not qualify for food stamps but granddaughter and her fiance do receive them. Patient's monthly income is $1258.    01/23/20:  Patient reports that granddaughter is part of a "parenting group" that paid for them to reside in a motel until tomorrow morning.  This same group has agreed to assist with first month of rent and deposit if they locate new rental property.  Patient states that she and granddaughter are actively searching rental properties.   Case Manager Clinical Goal(s):   Over the next 180 days, patient will work with BSW to address needs related to Housing barriers  Over the next 180 days, BSW will collaborate with Consulting civil engineer to address care management and care coordination needs  Interventions:   Informed patient that referral was submitted to [employer] and encouraged her to be looking out for phone call   Encouraged patient/granddaughter to utilize [employer] for housing options  Offered to take food bags to patient/family that were donated to [facility]; patient declined at this time  [place], [place], to inform her that patient declined food bags and has not yet been contacted by [facility] staff regarding referral for emergency housing.  Patient Self Care Activities:   Self administers medications as prescribed    Please see past updates related to this goal by clicking on the "Past Updates" button in the selected goal          The patient has been  provided with contact information for the care management team and has been advised to call with any health/community resource related questions or concerns.     [name], [place] Coordination Social Worker [place] [phone]

## 2020-01-23 NOTE — Progress Notes (Signed)
Internal Medicine Clinic Resident  I have personally reviewed this encounter including the documentation in this note and/or discussed this patient with the care management provider. I will address any urgent items identified by the care management provider and will communicate my actions to the patient's PCP. I have reviewed the patient's CCM visit with my supervising attending, Dr Heber [place].  Sanjuana Letters, MD 01/23/2020

## 2020-01-24 NOTE — Progress Notes (Signed)
Internal Medicine Clinic Attending  Case discussed with Dr. Masoudi  At the time of the visit.  We reviewed the resident's history and exam and pertinent patient test results.  I agree with the assessment, diagnosis, and plan of care documented in the resident's note.  

## 2020-01-25 ENCOUNTER — Encounter (HOSPITAL_COMMUNITY): Payer: Self-pay

## 2020-01-25 ENCOUNTER — Observation Stay (HOSPITAL_COMMUNITY): Payer: Medicare Other

## 2020-01-25 ENCOUNTER — Other Ambulatory Visit: Payer: Self-pay

## 2020-01-25 ENCOUNTER — Emergency Department (HOSPITAL_COMMUNITY): Payer: Medicare Other

## 2020-01-25 ENCOUNTER — Inpatient Hospital Stay (HOSPITAL_COMMUNITY)
Admission: EM | Admit: 2020-01-25 | Discharge: 2020-01-28 | DRG: 641 | Disposition: A | Discharge status: Skilled Nursing Facility | Payer: Medicare Other | Attending: Internal Medicine | Admitting: Internal Medicine

## 2020-01-25 DIAGNOSIS — D509 Iron deficiency anemia, unspecified: Secondary | ICD-10-CM | POA: Diagnosis not present

## 2020-01-25 DIAGNOSIS — M2550 Pain in unspecified joint: Secondary | ICD-10-CM | POA: Diagnosis not present

## 2020-01-25 DIAGNOSIS — F329 Major depressive disorder, single episode, unspecified: Secondary | ICD-10-CM | POA: Diagnosis present

## 2020-01-25 DIAGNOSIS — R52 Pain, unspecified: Secondary | ICD-10-CM | POA: Diagnosis not present

## 2020-01-25 DIAGNOSIS — Z8249 Family history of ischemic heart disease and other diseases of the circulatory system: Secondary | ICD-10-CM

## 2020-01-25 DIAGNOSIS — Z83438 Family history of other disorder of lipoprotein metabolism and other lipidemia: Secondary | ICD-10-CM

## 2020-01-25 DIAGNOSIS — K219 Gastro-esophageal reflux disease without esophagitis: Secondary | ICD-10-CM | POA: Diagnosis present

## 2020-01-25 DIAGNOSIS — Z6830 Body mass index (BMI) 30.0-30.9, adult: Secondary | ICD-10-CM

## 2020-01-25 DIAGNOSIS — J449 Chronic obstructive pulmonary disease, unspecified: Secondary | ICD-10-CM | POA: Diagnosis present

## 2020-01-25 DIAGNOSIS — Z888 Allergy status to other drugs, medicaments and biological substances status: Secondary | ICD-10-CM

## 2020-01-25 DIAGNOSIS — T675XXA Heat exhaustion, unspecified, initial encounter: Secondary | ICD-10-CM | POA: Diagnosis not present

## 2020-01-25 DIAGNOSIS — Z8261 Family history of arthritis: Secondary | ICD-10-CM | POA: Diagnosis not present

## 2020-01-25 DIAGNOSIS — I951 Orthostatic hypotension: Secondary | ICD-10-CM | POA: Diagnosis present

## 2020-01-25 DIAGNOSIS — R339 Retention of urine, unspecified: Secondary | ICD-10-CM

## 2020-01-25 DIAGNOSIS — E669 Obesity, unspecified: Secondary | ICD-10-CM | POA: Diagnosis present

## 2020-01-25 DIAGNOSIS — W19XXXA Unspecified fall, initial encounter: Secondary | ICD-10-CM | POA: Diagnosis present

## 2020-01-25 DIAGNOSIS — R296 Repeated falls: Secondary | ICD-10-CM | POA: Diagnosis present

## 2020-01-25 DIAGNOSIS — Z20822 Contact with and (suspected) exposure to covid-19: Secondary | ICD-10-CM | POA: Diagnosis present

## 2020-01-25 DIAGNOSIS — E785 Hyperlipidemia, unspecified: Secondary | ICD-10-CM | POA: Diagnosis present

## 2020-01-25 DIAGNOSIS — E860 Dehydration: Secondary | ICD-10-CM | POA: Diagnosis present

## 2020-01-25 DIAGNOSIS — M25559 Pain in unspecified hip: Secondary | ICD-10-CM | POA: Diagnosis not present

## 2020-01-25 DIAGNOSIS — J309 Allergic rhinitis, unspecified: Secondary | ICD-10-CM | POA: Diagnosis present

## 2020-01-25 DIAGNOSIS — M25552 Pain in left hip: Secondary | ICD-10-CM

## 2020-01-25 DIAGNOSIS — S0101XA Laceration without foreign body of scalp, initial encounter: Secondary | ICD-10-CM

## 2020-01-25 DIAGNOSIS — M6281 Muscle weakness (generalized): Secondary | ICD-10-CM | POA: Diagnosis not present

## 2020-01-25 DIAGNOSIS — G319 Degenerative disease of nervous system, unspecified: Secondary | ICD-10-CM | POA: Diagnosis not present

## 2020-01-25 DIAGNOSIS — Z590 Homelessness: Secondary | ICD-10-CM

## 2020-01-25 DIAGNOSIS — E1165 Type 2 diabetes mellitus with hyperglycemia: Secondary | ICD-10-CM

## 2020-01-25 DIAGNOSIS — Z79899 Other long term (current) drug therapy: Secondary | ICD-10-CM

## 2020-01-25 DIAGNOSIS — N179 Acute kidney failure, unspecified: Secondary | ICD-10-CM | POA: Diagnosis present

## 2020-01-25 DIAGNOSIS — N281 Cyst of kidney, acquired: Secondary | ICD-10-CM | POA: Diagnosis not present

## 2020-01-25 DIAGNOSIS — R234 Changes in skin texture: Secondary | ICD-10-CM | POA: Diagnosis not present

## 2020-01-25 DIAGNOSIS — I5032 Chronic diastolic (congestive) heart failure: Secondary | ICD-10-CM | POA: Diagnosis present

## 2020-01-25 DIAGNOSIS — G4489 Other headache syndrome: Secondary | ICD-10-CM | POA: Diagnosis not present

## 2020-01-25 DIAGNOSIS — M48061 Spinal stenosis, lumbar region without neurogenic claudication: Secondary | ICD-10-CM | POA: Diagnosis present

## 2020-01-25 DIAGNOSIS — Z23 Encounter for immunization: Secondary | ICD-10-CM | POA: Diagnosis not present

## 2020-01-25 DIAGNOSIS — L237 Allergic contact dermatitis due to plants, except food: Secondary | ICD-10-CM | POA: Diagnosis present

## 2020-01-25 DIAGNOSIS — M47816 Spondylosis without myelopathy or radiculopathy, lumbar region: Secondary | ICD-10-CM | POA: Diagnosis not present

## 2020-01-25 DIAGNOSIS — Z8673 Personal history of transient ischemic attack (TIA), and cerebral infarction without residual deficits: Secondary | ICD-10-CM

## 2020-01-25 DIAGNOSIS — S3992XA Unspecified injury of lower back, initial encounter: Secondary | ICD-10-CM | POA: Diagnosis not present

## 2020-01-25 DIAGNOSIS — Z7401 Bed confinement status: Secondary | ICD-10-CM | POA: Diagnosis not present

## 2020-01-25 DIAGNOSIS — I130 Hypertensive heart and chronic kidney disease with heart failure and stage 1 through stage 4 chronic kidney disease, or unspecified chronic kidney disease: Secondary | ICD-10-CM | POA: Diagnosis present

## 2020-01-25 DIAGNOSIS — F339 Major depressive disorder, recurrent, unspecified: Secondary | ICD-10-CM | POA: Diagnosis not present

## 2020-01-25 DIAGNOSIS — N1831 Chronic kidney disease, stage 3a: Secondary | ICD-10-CM | POA: Diagnosis present

## 2020-01-25 DIAGNOSIS — F17210 Nicotine dependence, cigarettes, uncomplicated: Secondary | ICD-10-CM | POA: Diagnosis present

## 2020-01-25 DIAGNOSIS — Z794 Long term (current) use of insulin: Secondary | ICD-10-CM

## 2020-01-25 DIAGNOSIS — I129 Hypertensive chronic kidney disease with stage 1 through stage 4 chronic kidney disease, or unspecified chronic kidney disease: Secondary | ICD-10-CM | POA: Diagnosis not present

## 2020-01-25 DIAGNOSIS — E1122 Type 2 diabetes mellitus with diabetic chronic kidney disease: Secondary | ICD-10-CM | POA: Diagnosis present

## 2020-01-25 DIAGNOSIS — I6381 Other cerebral infarction due to occlusion or stenosis of small artery: Secondary | ICD-10-CM | POA: Diagnosis not present

## 2020-01-25 DIAGNOSIS — R55 Syncope and collapse: Secondary | ICD-10-CM | POA: Diagnosis not present

## 2020-01-25 DIAGNOSIS — M419 Scoliosis, unspecified: Secondary | ICD-10-CM | POA: Diagnosis not present

## 2020-01-25 DIAGNOSIS — Z7901 Long term (current) use of anticoagulants: Secondary | ICD-10-CM

## 2020-01-25 DIAGNOSIS — R42 Dizziness and giddiness: Secondary | ICD-10-CM | POA: Diagnosis not present

## 2020-01-25 DIAGNOSIS — I6529 Occlusion and stenosis of unspecified carotid artery: Secondary | ICD-10-CM | POA: Diagnosis not present

## 2020-01-25 DIAGNOSIS — Z89421 Acquired absence of other right toe(s): Secondary | ICD-10-CM

## 2020-01-25 DIAGNOSIS — M47817 Spondylosis without myelopathy or radiculopathy, lumbosacral region: Secondary | ICD-10-CM | POA: Diagnosis not present

## 2020-01-25 DIAGNOSIS — E118 Type 2 diabetes mellitus with unspecified complications: Secondary | ICD-10-CM | POA: Diagnosis not present

## 2020-01-25 DIAGNOSIS — Z9181 History of falling: Secondary | ICD-10-CM

## 2020-01-25 DIAGNOSIS — R531 Weakness: Secondary | ICD-10-CM | POA: Diagnosis not present

## 2020-01-25 DIAGNOSIS — R0902 Hypoxemia: Secondary | ICD-10-CM | POA: Diagnosis not present

## 2020-01-25 DIAGNOSIS — I10 Essential (primary) hypertension: Secondary | ICD-10-CM | POA: Diagnosis not present

## 2020-01-25 LAB — CBC
HCT: 35.3 % — ABNORMAL LOW (ref 36.0–46.0)
Hemoglobin: 10.5 g/dL — ABNORMAL LOW (ref 12.0–15.0)
MCH: 28.8 pg (ref 26.0–34.0)
MCHC: 29.7 g/dL — ABNORMAL LOW (ref 30.0–36.0)
MCV: 97 fL (ref 80.0–100.0)
Platelets: 127 10*3/uL — ABNORMAL LOW (ref 150–400)
RBC: 3.64 MIL/uL — ABNORMAL LOW (ref 3.87–5.11)
RDW: 15.5 % (ref 11.5–15.5)
WBC: 7.7 10*3/uL (ref 4.0–10.5)
nRBC: 0 % (ref 0.0–0.2)

## 2020-01-25 LAB — BASIC METABOLIC PANEL
Anion gap: 11 (ref 5–15)
BUN: 28 mg/dL — ABNORMAL HIGH (ref 8–23)
CO2: 18 mmol/L — ABNORMAL LOW (ref 22–32)
Calcium: 9 mg/dL (ref 8.9–10.3)
Chloride: 110 mmol/L (ref 98–111)
Creatinine, Ser: 2.54 mg/dL — ABNORMAL HIGH (ref 0.44–1.00)
GFR calc Af Amer: 22 mL/min — ABNORMAL LOW (ref >60)
GFR calc non Af Amer: 19 mL/min — ABNORMAL LOW (ref >60)
Glucose, Bld: 87 mg/dL (ref 70–99)
Potassium: 4.9 mmol/L (ref 3.5–5.1)
Sodium: 139 mmol/L (ref 135–145)

## 2020-01-25 LAB — CBC WITH DIFFERENTIAL/PLATELET
Abs Immature Granulocytes: 0.06 10*3/uL (ref 0.00–0.07)
Basophils Absolute: 0.1 10*3/uL (ref 0.0–0.1)
Basophils Relative: 1 %
Eosinophils Absolute: 0.2 10*3/uL (ref 0.0–0.5)
Eosinophils Relative: 2 %
HCT: 43.8 % (ref 36.0–46.0)
Hemoglobin: 12.8 g/dL (ref 12.0–15.0)
Immature Granulocytes: 1 %
Lymphocytes Relative: 17 %
Lymphs Abs: 1.7 10*3/uL (ref 0.7–4.0)
MCH: 29 pg (ref 26.0–34.0)
MCHC: 29.2 g/dL — ABNORMAL LOW (ref 30.0–36.0)
MCV: 99.3 fL (ref 80.0–100.0)
Monocytes Absolute: 0.9 10*3/uL (ref 0.1–1.0)
Monocytes Relative: 9 %
Neutro Abs: 7.2 10*3/uL (ref 1.7–7.7)
Neutrophils Relative %: 70 %
Platelets: 188 10*3/uL (ref 150–400)
RBC: 4.41 MIL/uL (ref 3.87–5.11)
RDW: 15.5 % (ref 11.5–15.5)
WBC: 10.1 10*3/uL (ref 4.0–10.5)
nRBC: 0 % (ref 0.0–0.2)

## 2020-01-25 LAB — SARS CORONAVIRUS 2 BY RT PCR (HOSPITAL ORDER, PERFORMED IN ~~LOC~~ HOSPITAL LAB): SARS Coronavirus 2: NEGATIVE

## 2020-01-25 LAB — COMPREHENSIVE METABOLIC PANEL
ALT: 34 U/L (ref 0–44)
AST: 32 U/L (ref 15–41)
Albumin: 3.1 g/dL — ABNORMAL LOW (ref 3.5–5.0)
Alkaline Phosphatase: 48 U/L (ref 38–126)
Anion gap: 9 (ref 5–15)
BUN: 25 mg/dL — ABNORMAL HIGH (ref 8–23)
CO2: 18 mmol/L — ABNORMAL LOW (ref 22–32)
Calcium: 8.4 mg/dL — ABNORMAL LOW (ref 8.9–10.3)
Chloride: 111 mmol/L (ref 98–111)
Creatinine, Ser: 2.11 mg/dL — ABNORMAL HIGH (ref 0.44–1.00)
GFR calc Af Amer: 28 mL/min — ABNORMAL LOW (ref >60)
GFR calc non Af Amer: 24 mL/min — ABNORMAL LOW (ref >60)
Glucose, Bld: 91 mg/dL (ref 70–99)
Potassium: 4.2 mmol/L (ref 3.5–5.1)
Sodium: 138 mmol/L (ref 135–145)
Total Bilirubin: 0.5 mg/dL (ref 0.3–1.2)
Total Protein: 6.2 g/dL — ABNORMAL LOW (ref 6.5–8.1)

## 2020-01-25 LAB — URINALYSIS, ROUTINE W REFLEX MICROSCOPIC
Bilirubin Urine: NEGATIVE
Glucose, UA: >=500 mg/dL — AB
Ketones, ur: NEGATIVE mg/dL
Nitrite: POSITIVE — AB
Protein, ur: 100 mg/dL — AB
Specific Gravity, Urine: 1.009 (ref 1.005–1.030)
WBC, UA: >50 WBC/hpf — ABNORMAL HIGH (ref 0–5)
pH: 5 (ref 5.0–8.0)

## 2020-01-25 LAB — GLUCOSE, CAPILLARY
Glucose-Capillary: 93 mg/dL (ref 70–99)
Glucose-Capillary: 82 mg/dL (ref 70–99)
Glucose-Capillary: 140 mg/dL — ABNORMAL HIGH (ref 70–99)
Glucose-Capillary: 133 mg/dL — ABNORMAL HIGH (ref 70–99)

## 2020-01-25 LAB — HIV ANTIBODY (ROUTINE TESTING W REFLEX): HIV Screen 4th Generation wRfx: NONREACTIVE

## 2020-01-25 LAB — PROTEIN / CREATININE RATIO, URINE
Creatinine, Urine: 81.54 mg/dL
Protein Creatinine Ratio: 1.24 mg/mg{Cre} — ABNORMAL HIGH (ref 0.00–0.15)
Total Protein, Urine: 101 mg/dL

## 2020-01-25 LAB — CK: Total CK: 171 U/L (ref 38–234)

## 2020-01-25 LAB — SODIUM, URINE, RANDOM: Sodium, Ur: 79 mmol/L

## 2020-01-25 LAB — PHOSPHORUS: Phosphorus: 3 mg/dL (ref 2.5–4.6)

## 2020-01-25 LAB — PROTIME-INR
INR: 1.1 (ref 0.8–1.2)
Prothrombin Time: 13.4 seconds (ref 11.4–15.2)

## 2020-01-25 LAB — LACTIC ACID, PLASMA: Lactic Acid, Venous: 0.9 mmol/L (ref 0.5–1.9)

## 2020-01-25 LAB — TSH: TSH: 1.425 u[IU]/mL (ref 0.350–4.500)

## 2020-01-25 LAB — MAGNESIUM: Magnesium: 2.2 mg/dL (ref 1.7–2.4)

## 2020-01-25 MED ORDER — LACTATED RINGERS IV SOLN: INTRAVENOUS | Status: AC

## 2020-01-25 MED ORDER — PANTOPRAZOLE SODIUM 40 MG PO TBEC
40.0000 mg | DELAYED_RELEASE_TABLET | Freq: Two times a day (BID) | ORAL | Status: DC
  Administered 2020-01-25 – 2020-01-28 (×7): 40 mg via ORAL
  Filled 2020-01-25 (×7): qty 1

## 2020-01-25 MED ORDER — ROSUVASTATIN CALCIUM 20 MG PO TABS
40.0000 mg | ORAL_TABLET | Freq: Every day | ORAL | Status: DC
  Administered 2020-01-25 – 2020-01-28 (×4): 40 mg via ORAL
  Filled 2020-01-25 (×4): qty 2

## 2020-01-25 MED ORDER — ENOXAPARIN SODIUM 30 MG/0.3ML ~~LOC~~ SOLN
30.0000 mg | SUBCUTANEOUS | Status: DC
  Administered 2020-01-25 – 2020-01-26 (×2): 30 mg via SUBCUTANEOUS
  Filled 2020-01-25 (×2): qty 0.3

## 2020-01-25 MED ORDER — SODIUM CHLORIDE 0.9 % IV BOLUS
1000.0000 mL | Freq: Once | INTRAVENOUS | Status: AC
  Administered 2020-01-25: 1000 mL via INTRAVENOUS

## 2020-01-25 MED ORDER — FENTANYL CITRATE (PF) 100 MCG/2ML IJ SOLN
50.0000 ug | Freq: Once | INTRAMUSCULAR | Status: AC
  Administered 2020-01-25: 50 ug via INTRAVENOUS
  Filled 2020-01-25: qty 2

## 2020-01-25 MED ORDER — INSULIN ASPART 100 UNIT/ML ~~LOC~~ SOLN
0.0000 [IU] | Freq: Three times a day (TID) | SUBCUTANEOUS | Status: DC
  Administered 2020-01-25 – 2020-01-26 (×2): 2 [IU] via SUBCUTANEOUS
  Administered 2020-01-26: 3 [IU] via SUBCUTANEOUS
  Administered 2020-01-27: 5 [IU] via SUBCUTANEOUS
  Administered 2020-01-28 (×2): 3 [IU] via SUBCUTANEOUS
  Administered 2020-01-28: 5 [IU] via SUBCUTANEOUS

## 2020-01-25 MED ORDER — SERTRALINE HCL 100 MG PO TABS
100.0000 mg | ORAL_TABLET | Freq: Every day | ORAL | Status: DC
  Administered 2020-01-25 – 2020-01-28 (×4): 100 mg via ORAL
  Filled 2020-01-25 (×4): qty 1

## 2020-01-25 MED ORDER — LACTATED RINGERS IV BOLUS
1000.0000 mL | Freq: Once | INTRAVENOUS | Status: AC
  Administered 2020-01-25: 1000 mL via INTRAVENOUS

## 2020-01-25 MED ORDER — TETANUS-DIPHTH-ACELL PERTUSSIS 5-2.5-18.5 LF-MCG/0.5 IM SUSP
0.5000 mL | Freq: Once | INTRAMUSCULAR | Status: AC
  Administered 2020-01-25: 0.5 mL via INTRAMUSCULAR
  Filled 2020-01-25: qty 0.5

## 2020-01-25 MED ORDER — CEPHALEXIN 500 MG PO CAPS
500.0000 mg | ORAL_CAPSULE | Freq: Three times a day (TID) | ORAL | Status: DC
  Administered 2020-01-25 – 2020-01-28 (×9): 500 mg via ORAL
  Filled 2020-01-25 (×9): qty 1

## 2020-01-25 MED ORDER — ACETAMINOPHEN 650 MG RE SUPP: 650.0000 mg | Freq: Four times a day (QID) | RECTAL | Status: DC | PRN

## 2020-01-25 MED ORDER — HYDROMORPHONE HCL 1 MG/ML IJ SOLN
0.5000 mg | INTRAMUSCULAR | Status: DC | PRN
  Administered 2020-01-25 – 2020-01-26 (×4): 0.5 mg via INTRAVENOUS
  Filled 2020-01-25 (×4): qty 1

## 2020-01-25 MED ORDER — ACETAMINOPHEN 325 MG PO TABS: 650.0000 mg | ORAL_TABLET | Freq: Four times a day (QID) | ORAL | Status: DC | PRN

## 2020-01-25 MED ORDER — ACETAMINOPHEN 500 MG PO TABS
1000.0000 mg | ORAL_TABLET | Freq: Three times a day (TID) | ORAL | Status: DC
  Administered 2020-01-25 – 2020-01-28 (×9): 1000 mg via ORAL
  Filled 2020-01-25 (×11): qty 2

## 2020-01-25 NOTE — ED Notes (Signed)
Pt transported to CT ?

## 2020-01-25 NOTE — H&P (Addendum)
Date: 01/25/2020               Patient Name:  [name] MRN: [id number]  DOB: [birth year] Age / Sex: 69 y.o., female   PCP: [name], MD         Medical Service: Internal Medicine Teaching Service         Attending Physician: Dr. Davonna Belling, MD;Gu*    First Contact: Dr. Kai Levins Pager: [phone]  Second Contact: Dr. Truman Hayward Pager: [phone]       After Hours (After 5p/  First Contact Pager: [phone]  weekends / holidays): Second Contact Pager: [phone]   Chief Complaint: Dizziness and Fall  History of Present Illness:   [name] is a 69yo female with PMH of TIIDM, TIA, CKD stage III intracranial aneurysms, recurrent falls, iron deficiency anemia, HTN, HLD, COPD, MDD, and tobacco use presenting after she went to stand and lost consciousness yesterday evening. The next thing she remembers is her family around her, and she had hit her head.  She is not sure what happened and she is not sure if her daughter, who was with her, saw her, but she did fall backward when she passed out and hit her head.  Two days ago she was staying in a motel and was kicked out and since that time has been living outside in the heat and in her car with her daughter and grandson. She has not eaten much, which is normal for her but was trying to drink a lot of water. She has not urinated since yesterday morning but denies recent dysuria, hematuria, or other difficulty urinating. Prior to her fall she was feeling much more tired than usual. She has a history of recurrent falls but does not usually lose consciousness, it's just like her legs suddenly give out from under her.   She denies recent chest pain, nausea, vomiting, shortness of breath, or dizziness. She has fallen three times in the past two weeks. She does not have dizziness with these episodes and still is not sure why they happen. She denies recent sick contacts or other recent illness.  She does endorse pain in the left hip and  lower back which is chronic but increased since her fall. She is not sure if she fell on this area. She has chronic numbness and tingling in all of her extremities due to her diabetes.   ED Course: On the scene with EMS blood pressure was 29J systolic. This was 95/54 on arrival to the ER, O2 95% on RA, Temp 99.1, pulse 87. Creatinine 2.57 from baseline 1.2 with normal CBC. She had an occipital laceration with placement of staples. CT head was negative for acute intracranial findings. She was given fentanyl IV and 2L LR.   Social:   Recently moved out of motel that she and her daughter and grandson were staying in the last 10 days after they were recently evicted from their home.  She smokes about 4 cigarettes per day and has smoked for the last 40 years.  She does not use drugs recreationally or drink alcohol   Family History:   Family History  Problem Relation Age of Onset  . Hyperlipidemia Mother   . Heart attack Mother   . Heart attack Father 2  . Hypertension Father   . Cancer Paternal Grandfather        Lung cancer  . Heart attack Paternal Grandfather   . Cancer Paternal Grandmother   . Obesity  Daughter        gastric bypass surgery  . Arthritis Sister   . Migraines Daughter   . Chiari malformation Daughter   . Arthritis Daughter   . Dillwyn White syndrome Daughter   . Colon cancer Neg Hx   . Colon polyps Neg Hx   . Esophageal cancer Neg Hx   . Rectal cancer Neg Hx   . Stomach cancer Neg Hx      Meds:  Current Meds  Medication Sig  . albuterol (PROVENTIL HFA;VENTOLIN HFA) 108 (90 Base) MCG/ACT inhaler Inhale 1-2 puffs into the lungs every 6 (six) hours as needed for wheezing or shortness of breath.  Marland Kitchen amitriptyline (ELAVIL) 50 MG tablet Take 1 tablet (50 mg total) by mouth at bedtime.  . Canagliflozin-metFORMIN HCl ER (INVOKAMET XR) 150-500 MG TB24 Take 2 tablets by mouth daily. (Patient taking differently: Take 2 tablets by mouth in the morning and at bedtime.  )  . cetirizine (ZYRTEC) 5 MG tablet Take 1 tablet (5 mg total) by mouth daily. As needed for hand itchiness. (Patient taking differently: Take 5 mg by mouth daily as needed for allergies. As needed for hand itchiness.)  . fluticasone (FLONASE) 50 MCG/ACT nasal spray Place 2 sprays into both nostrils daily.  Marland Kitchen gabapentin (NEURONTIN) 400 MG capsule Take 1 capsule (400 mg total) by mouth 3 (three) times daily.  . Insulin Degludec-Liraglutide (XULTOPHY) 100-3.6 UNIT-MG/ML SOPN Inject 18 Units into the skin daily.  Marland Kitchen lisinopril (ZESTRIL) 5 MG tablet Take 1 tablet (5 mg total) by mouth daily.  . pantoprazole (PROTONIX) 40 MG tablet TAKE 1 TABLET BY MOUTH TWICE DAILY (Patient taking differently: Take 40 mg by mouth 2 (two) times daily. )  . rosuvastatin (CRESTOR) 40 MG tablet TAKE 1 TABLET(40 MG) BY MOUTH DAILY (Patient taking differently: Take 40 mg by mouth daily. )  . sertraline (ZOLOFT) 100 MG tablet TAKE 1 TABLET(100 MG) BY MOUTH DAILY (Patient taking differently: Take 100 mg by mouth daily. TAKE 1 TABLET(100 MG) BY MOUTH DAILY)     Allergies: Allergies as of 01/25/2020 - Review Complete 01/25/2020  Allergen Reaction Noted  . Flexeril [cyclobenzaprine] Other (See Comments) 08/12/2012  . Propofol Other (See Comments) 01/20/2017  . Soma [carisoprodol] Itching and Rash 05/13/2012   Past Medical History:  Diagnosis Date  . Acute blood loss anemia 09/27/2018  . Allergy   . Anemia   . Arthritis    "left knee" (09/08/2015)  . Asthma   . Blood transfusion without reported diagnosis 10/2017   anemic  . Brachial plexus disorders   . CHF (congestive heart failure) ([place])   . Chronic pain    neck pain, headache, neuropathy  . COPD (chronic obstructive pulmonary disease) ([place])    SEES ONLY DR. PATEL -   . Depression    "when my husband passed in 2013"  . Facial trauma 05/2018  . Facial trauma, initial encounter 06/18/2018  . Fall 04/19/2018  . Gastric ulcer   . GERD (gastroesophageal reflux  disease)   . Hemorrhagic gastritis   . History of kidney stones   . Hypertension   . Hypertriglyceridemia   . IDA (iron deficiency anemia)   . Internal hemorrhoids   . Intracranial aneurysm   . Migraine    "monthly" (09/08/2015)  . Neuromuscular disorder (H[facility])    neuropathy  . Puncture wound of foot, right 05/17/2012   Tetanus shot 3 yrs ago at [facility] in [place], per pt report   . Stroke (  [place])    TIAS    IN [place]   4 YRS AGO   . Type II diabetes mellitus ([place]) 2010   diagnosed around 2010, only ever on metformin     Review of Systems: A complete ROS was negative except as per HPI.   Physical Exam: Blood pressure 126/69, pulse 85, temperature 98.1 F (36.7 C), temperature source Oral, resp. rate 16, height 5\' 6"  (1.676 m), weight 83 kg, SpO2 97 %.  Constitution: mild distress, supine in bed HENT: occipital laceration with staples, no current bleeding Eyes: eom intact, PERLLA Cardio: RRR, no m/r/g Respiratory: mild wheeze RUL, otherwise CTA  Abdominal: soft, non-distended, NTTP  MSK: TTP left trochanteric region of hip, full range of motion left and right leg without pain; no spinal tenderness  Neuro: alert & oriented x3, normal affect, increased light sensitivity  Skin: no bruising over the left hip    EKG: personally reviewed my interpretation is normal sinus rhythm with lateral T-wave inversions unchanged from prior ECG  DG Spine 8/14 IMPRESSION: 1. No traumatic injury; moderate multilevel degenerative spondylosis present.   CT left hip 7/24 No evidence of hip fracture or dislocation There is possible soft tissue contusion over left lateral hip Moderate multifactorial spinal stenosis at L4-L5 and chronic right foraminal narrowing L5-S1  Assessment & Plan by Problem: Active Problems:   AKI (acute kidney injury) ([place])  [name] is a 69yo female with PMH of TIIDM, CKD stage III, TIA, intracranial aneurysms, recurrent falls, iron deficiency anemia,  HTN, HLD, COPD, MDD, and tobacco use presenting after syncopal episode when standing and acute kidney injury.    Syncopal Episode Possibly secondary to orthostatic hypotension with dehydration in the setting of heat, decreased po intake, possibly worsened by canagliflozin although this is not a new medication. She also has a history syncopal episodes and falls. She has an ICM loop recorder, which was last interrogated 12/2019 and showed no recent cardiac arrhythmias or other acute findings.  Last echo was 01/2018 with EF of 65%. Previous falls were thought to be secondary to centrally acting medications and tizanidine was stopped and gabapentin has been recently titrated down and is now 400 TID.   - 2L LR then 100 cc/hr - repeat CMP, Mg, Phos  - add LA  - orthostatic vital signs   AKI Baseline creatinine 1.2, now 2.5 on admission. Likely prerenal, also on multiple nephrotoxic medications although patient endorses no urination since yesterday morning although feels the urge to urinated.   - post void residual, I/O - renal US  - UA  - FeNa - hold lisinopril, gabapentin, canagliflozin.    Recurrent Falls Her recurrent falls appear to be a separate issue from this evening, and involve her legs giving out. Most recent hip CT did show moderate multifactorial spinal stenosis at L4-L5.   - recommend outpatient follow-up with orthopedics.   HTN Holding current hypertensives.   TIIDM Glucose 87.   - hold insulin for now - CBG with meals and QHS   Diet: CM VTE: lovenox IVF: LR Code: full   Dispo: Admit patient to Observation with expected length of stay less than 2 midnights.  SignedMarty Heck, DO 01/25/2020, 5:47 AM  Pager: [phone]

## 2020-01-25 NOTE — ED Provider Notes (Addendum)
[facility] EMERGENCY DEPARTMENT Provider Note   CSN: [id number] Arrival date & time: 01/25/20  0116     History Chief Complaint  Patient presents with  . Fall  . Near Syncope    [name] is a 69 y.o. female.  HPI Patient presents after fall.  Patient is living in her truck and reportedly stood up after sitting up back by the tailgate and fell.  Patient does not remember what happened really. She remembers she was lying outside the truck on the ground with her grandchild yelling.  Had some hypotension with EMS arrival.  Small laceration to head.  Blood pressure improved to 90 systolic after 60 systolic.  Had brief loss consciousness.  No chest pain.  No trouble breathing.  States she has a headache and the light bothers her now.  Unknown last tetanus.    Past Medical History:  Diagnosis Date  . Acute blood loss anemia 09/27/2018  . Allergy   . Anemia   . Arthritis    "left knee" (09/08/2015)  . Asthma   . Blood transfusion without reported diagnosis 10/2017   anemic  . Brachial plexus disorders   . CHF (congestive heart failure) ([place])   . Chronic pain    neck pain, headache, neuropathy  . COPD (chronic obstructive pulmonary disease) ([place])    SEES ONLY DR. PATEL -   . Depression    "when my husband passed in 2013"  . Facial trauma 05/2018  . Facial trauma, initial encounter 06/18/2018  . Fall 04/19/2018  . Gastric ulcer   . GERD (gastroesophageal reflux disease)   . Hemorrhagic gastritis   . History of kidney stones   . Hypertension   . Hypertriglyceridemia   . IDA (iron deficiency anemia)   . Internal hemorrhoids   . Intracranial aneurysm   . Migraine    "monthly" (09/08/2015)  . Neuromuscular disorder (HCC)    neuropathy  . Puncture wound of foot, right 05/17/2012   Tetanus shot 3 yrs ago at [facility] in [place], per pt report   . Stroke ([place])    TIAS    IN [place]   4 YRS AGO   . Type II diabetes mellitus ([place]) 2010    diagnosed around 2010, only ever on metformin    Patient Active Problem List   Diagnosis Date Noted  . Neck pain 06/04/2019  . Toothache 03/28/2019  . Enlarged lymph node in neck 03/28/2019  . Irritant hand dermatitis 02/03/2019  . Dental caries 10/29/2018  . Perinephric hematoma 09/26/2018  . Nephrolithiasis 09/06/2018  . Vomiting 07/21/2018  . Post concussive syndrome 06/20/2018  . Head trauma 06/18/2018  . Intractable vomiting with nausea 06/18/2018  . Pain in joint of left shoulder 05/15/2018  . Falls 04/19/2018  . Iron deficiency anemia due to chronic blood loss 01/05/2018  . Symptomatic anemia 11/07/2017  . Pressure injury of skin 11/07/2017  . Acute gastric ulcer with hemorrhage   . Pain in left knee 09/12/2017  . Healthcare maintenance 09/09/2017  . Allergic rhinitis 09/09/2017  . Major depressive disorder 07/21/2017  . Dysuria 06/16/2017  . Demand ischemia ([place]) 06/16/2017  . Petechiae 06/16/2017  . Migraine 06/09/2017  . AKI (acute kidney injury) ([place])   . Right foot pain 04/22/2017  . History of total knee replacement, 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  . Neck muscle strain 08/19/2016  . Onychomycosis of multiple toenails with type 2 diabetes mellitus ([place]) 11/04/2015  . Skin lesions 11/04/2015  . Syncope   .  Overweight (BMI 25.0-29.9) 05/27/2015  . Leg ulcer, left ([place]) 03/05/2015  . Primary osteoarthritis of left knee 09/22/2014  . Esophageal reflux 07/18/2014  . Hyperlipidemia associated with type 2 diabetes mellitus ([place]) 02/26/2014  . Tobacco use disorder 07/22/2013  . Headache 06/28/2013  . Diabetic peripheral neuropathy associated with type 2 diabetes mellitus ([place]) 06/14/2013  . Hx-TIA (transient ischemic attack) 03/10/2013  . Diabetes mellitus type 2, controlled, with complications ([place]) 29/51/8841  . Hypertension 05/13/2012    Past Surgical History:  Procedure Laterality Date  . AMPUTATION Right 05/01/2015   Procedure: Right Foot 1st Ray Amputation;  Surgeon:  Newt Minion, MD;  Location: [facility];  Service: Orthopedics;  Laterality: Right;  . ARTHRODESIS METATARSAL     RIGHT 5 TH   . BILATERAL SALPINGOOPHORECTOMY Bilateral 1986   "after hemtoma evacuations; had to cut me open"  . BIOPSY  11/07/2017   Procedure: BIOPSY;  Surgeon: Irene Shipper, MD;  Location: [facility] ENDOSCOPY;  Service: Endoscopy;;  . CARPAL TUNNEL RELEASE Bilateral   . COLONOSCOPY     10 + yrs ago- pt unsure- was in [place]- pt states  MD will not send records but colon was normal per pt.   . CYSTOSCOPY W/ URETERAL STENT PLACEMENT Left 09/06/2018   Procedure: CYSTOSCOPY WITH LEFT RETROGRADE PYELOGRAM/URETERAL LEFT DOUBLE J STENT PLACEMENT;  Surgeon: Franchot Gallo, MD;  Location: [facility];  Service: Urology;  Laterality: Left;  . DILATION AND CURETTAGE OF UTERUS  ~ 1982   S/P miscarriage  . ESOPHAGOGASTRODUODENOSCOPY (EGD) WITH PROPOFOL N/A 11/07/2017   Procedure: ESOPHAGOGASTRODUODENOSCOPY (EGD) WITH PROPOFOL;  Surgeon: Irene Shipper, MD;  Location: [facility] ENDOSCOPY;  Service: Endoscopy;  Laterality: N/A;  . EXTRACORPOREAL SHOCK WAVE LITHOTRIPSY Left 09/24/2018   Procedure: EXTRACORPOREAL SHOCK WAVE LITHOTRIPSY (ESWL);  Surgeon: Cleon Gustin, MD;  Location: [facility] ORS;  Service: Urology;  Laterality: Left;  . HEMATOMA EVACUATION Right 1986 x 2   "OVARY; w/in 1 wk after hysterectomy"  . implantable loop recorder placement  05/23/2019   MDT Reveal [place] [facility] [id number]) implanted by Dr Rayann Heman in office for evaluation of syncope  . KNEE ARTHROSCOPY     LEFT  . LAPAROSCOPIC CHOLECYSTECTOMY    . SHOULDER ARTHROSCOPY W/ ROTATOR CUFF REPAIR Bilateral   . TONSILLECTOMY    . TOTAL KNEE ARTHROPLASTY Left 09/30/2016   Procedure: LEFT TOTAL KNEE ARTHROPLASTY;  Surgeon: Netta Cedars, MD;  Location: [facility];  Service: Orthopedics;  Laterality: Left;  . TUBAL LIGATION    . VAGINAL HYSTERECTOMY  1986     OB History   No obstetric history on file.     Family History  Problem Relation Age of  Onset  . Hyperlipidemia Mother   . Heart attack Mother   . Heart attack Father 39  . Hypertension Father   . Cancer Paternal Grandfather        Lung cancer  . Heart attack Paternal Grandfather   . Cancer Paternal Grandmother   . Obesity Daughter        gastric bypass surgery  . Arthritis Sister   . Migraines Daughter   . Chiari malformation Daughter   . Arthritis Daughter   . [place] White syndrome Daughter   . Colon cancer Neg Hx   . Colon polyps Neg Hx   . Esophageal cancer Neg Hx   . Rectal cancer Neg Hx   . Stomach cancer Neg Hx     Social History   Tobacco Use  . Smoking status:  Current Every Day Smoker    Packs/day: 0.30    Years: 49.00    Pack years: 14.70  . Smokeless tobacco: Never Used  . Tobacco comment: stopped 3 days ago   Vaping Use  . Vaping Use: Never used  Substance Use Topics  . Alcohol use: No    Alcohol/week: 0.0 standard drinks  . Drug use: No    Home Medications Prior to Admission medications   Medication Sig Start Date End Date Taking? Authorizing Provider  albuterol (PROVENTIL HFA;VENTOLIN HFA) 108 (90 Base) MCG/ACT inhaler Inhale 1-2 puffs into the lungs every 6 (six) hours as needed for wheezing or shortness of breath. 02/10/17  Yes Lucious Groves, DO  amitriptyline (ELAVIL) 50 MG tablet Take 1 tablet (50 mg total) by mouth at bedtime. 11/27/19  Yes Raulkar, Clide Deutscher, MD  Canagliflozin-metFORMIN HCl ER (INVOKAMET XR) 150-500 MG TB24 Take 2 tablets by mouth daily. Patient taking differently: Take 2 tablets by mouth in the morning and at bedtime.  11/26/19 02/24/20 Yes Chundi, Vahini, MD  cetirizine (ZYRTEC) 5 MG tablet Take 1 tablet (5 mg total) by mouth daily. As needed for hand itchiness. Patient taking differently: Take 5 mg by mouth daily as needed for allergies. As needed for hand itchiness. 02/01/19  Yes Marianna Payment, MD  fluticasone [facility]) 50 MCG/ACT nasal spray Place 2 sprays into both nostrils daily. 09/20/19 09/19/20 Yes  Maudie Mercury, MD  gabapentin (NEURONTIN) 400 MG capsule Take 1 capsule (400 mg total) by mouth 3 (three) times daily. 01/17/20  Yes Masoudi, Elhamalsadat, MD  Insulin Degludec-Liraglutide (XULTOPHY) 100-3.6 UNIT-MG/ML SOPN Inject 18 Units into the skin daily. 01/18/20  Yes Madalyn Rob, MD  lisinopril (ZESTRIL) 5 MG tablet Take 1 tablet (5 mg total) by mouth daily. 11/29/19  Yes Chundi, Vahini, MD  pantoprazole (PROTONIX) 40 MG tablet TAKE 1 TABLET BY MOUTH TWICE DAILY Patient taking differently: Take 40 mg by mouth 2 (two) times daily.  12/03/19  Yes Pyrtle, Lajuan Lines, MD  rosuvastatin (CRESTOR) 40 MG tablet TAKE 1 TABLET(40 MG) BY MOUTH DAILY Patient taking differently: Take 40 mg by mouth daily.  12/17/19  Yes Christian, Rylee, MD  sertraline (ZOLOFT) 100 MG tablet TAKE 1 TABLET(100 MG) BY MOUTH DAILY Patient taking differently: Take 100 mg by mouth daily. TAKE 1 TABLET(100 MG) BY MOUTH DAILY 09/17/19  Yes Maudie Mercury, MD  amLODipine (NORVASC) 10 MG tablet TAKE 1 TABLET BY MOUTH EVERY DAY Patient not taking: Reported on 01/25/2020 07/26/19   Lars Mage, MD  glucose blood (ONETOUCH VERIO) test strip Use as instructed 06/04/19   Welford Roche, MD  HYDROcodone-acetaminophen (NORCO/VICODIN) 5-325 MG tablet Take 1 tablet by mouth every 4 (four) hours as needed. Patient not taking: Reported on 01/25/2020 01/04/20   Isla Pence, MD  Insulin Pen Needle (NOVOFINE PLUS) 32G X 4 MM MISC 1 Units by Does not apply route every morning. 10/03/19   Maudie Mercury, MD  Lancets [facility] DELICA PLUS [id number]) MISC 1 each by Does not apply route 2 (two) times daily. 06/04/19   Welford Roche, MD    Allergies    Flexeril [cyclobenzaprine], Propofol, and Soma [carisoprodol]  Review of Systems   Review of Systems  Constitutional: Negative for appetite change and fever.  HENT: Negative for congestion.   Respiratory: Negative for shortness of breath.   Gastrointestinal: Negative for abdominal  pain.  Genitourinary: Negative for flank pain.  Musculoskeletal: Negative for back pain.  Skin: Positive for wound.  Neurological: Positive for light-headedness.  Psychiatric/Behavioral: Negative for confusion.    Physical Exam Updated Vital Signs BP 114/68   Pulse 83   Temp 99.1 F (37.3 C) (Oral)   Resp 19   Ht 5\' 6"  (1.676 m)   Wt 83 kg   SpO2 97%   BMI 29.54 kg/m   Physical Exam Vitals reviewed.  HENT:     Head:     Comments: Blood to occipital area.  Will need more cleaning to see if there is a laceration. Eyes:     Pupils: Pupils are equal, round, and reactive to light.  Neck:     Comments: No midline tenderness. Cardiovascular:     Rate and Rhythm: Regular rhythm.  Pulmonary:     Breath sounds: No wheezing or rhonchi.  Abdominal:     Tenderness: There is no abdominal tenderness.  Musculoskeletal:        General: No tenderness.     Cervical back: Neck supple.  Skin:    General: Skin is warm.     Capillary Refill: Capillary refill takes less than 2 seconds.  Neurological:     Mental Status: She is alert and oriented to person, place, and time.     ED Results / Procedures / Treatments   Labs (all labs ordered are listed, but only abnormal results are displayed) Labs Reviewed  CBC WITH DIFFERENTIAL/PLATELET - Abnormal; Notable for the following components:      Result Value   MCHC 29.2 (*)    All other components within normal limits  BASIC METABOLIC PANEL - Abnormal; Notable for the following components:   CO2 18 (*)    BUN 28 (*)    Creatinine, Ser 2.54 (*)    GFR calc non Af Amer 19 (*)    GFR calc Af Amer 22 (*)    All other components within normal limits  SARS CORONAVIRUS 2 BY RT PCR (HOSPITAL ORDER, [facility])    EKG None  Radiology CT Head Wo Contrast  Result Date: 01/25/2020 CLINICAL DATA:  Head trauma EXAM: CT HEAD WITHOUT CONTRAST TECHNIQUE: Contiguous axial images were obtained from the base of the skull  through the vertex without intravenous contrast. COMPARISON:  CT brain 06/18/2018 FINDINGS: Brain: No acute territorial infarction, hemorrhage, or intracranial mass. Mild atrophy. Mild hypodensity in the white matter consistent with chronic small vessel ischemic change. Chronic appearing lacunar infarcts within the bilateral basal ganglia. Stable ventricle size. Vascular: No hyperdense vessels.  Carotid vascular calcification Skull: Normal. Negative for fracture or focal lesion. Sinuses/Orbits: No acute finding. Other: Moderate occipital scalp laceration and hematoma IMPRESSION: 1. No CT evidence for acute intracranial abnormality. 2. Atrophy and mild chronic small vessel ischemic changes of the white matter. 3. Moderate occipital scalp laceration and hematoma. Electronically Signed   By: Donavan Foil M.D.   On: 01/25/2020 02:07    Procedures .Marland KitchenLaceration Repair  Date/Time: 01/25/2020 5:08 AM Performed by: Davonna Belling, MD Authorized by: Davonna Belling, MD   Consent:    Consent obtained:  Verbal   Consent given by:  Patient   Risks discussed:  Infection, need for additional repair, nerve damage, poor cosmetic result and poor wound healing   Alternatives discussed:  No treatment Anesthesia (see MAR for exact dosages):    Anesthesia method:  None Laceration details:    Location:  Scalp   Scalp location:  Occipital   Length (cm):  0.5 Repair type:    Repair type:  Simple Exploration:    Contaminated:  no   Treatment:    Area cleansed with:  Saline   Amount of cleaning:  Standard   Visualized foreign bodies/material removed: no   Skin repair:    Repair method:  Staples   Number of staples:  1 Approximation:    Approximation:  Close Post-procedure details:    Dressing:  Open (no dressing)   Patient tolerance of procedure:  Tolerated well, no immediate complications   (including critical care time)  Medications Ordered in ED Medications  sodium chloride 0.9 % bolus 1,000 mL  (0 mLs Intravenous Stopped 01/25/20 0341)  Tdap (BOOSTRIX) injection 0.5 mL (0.5 mLs Intramuscular Given 01/25/20 0200)    ED Course  I have reviewed the triage vital signs and the nursing notes.  Pertinent labs & imaging results that were available during my care of the patient were reviewed by me and considered in my medical decision making (see chart for details).    MDM Rules/Calculators/A&P                         Patient presents after fall.  States she lost her balance and fell backwards striking her head.  However was hypotensive.  Patient is homeless and has not been eating and drinking much.  Does have an acute kidney injury for creatinine is doubled up to 2.5 over the last month or 2.  Initially hypotensive but is improved some with IV fluids.  With poor social status and acute kidney injury will talk with the internal medicine residents, who are her primary care doctor about course of care and possible admission.  Final Clinical Impression(s) / ED Diagnoses Final diagnoses:  AKI (acute kidney injury) [facility])    Rx / [place] Orders ED Discharge Orders    None       Davonna Belling, MD 01/25/20 0405    Davonna Belling, MD 01/25/20 [phone]

## 2020-01-25 NOTE — NC FL2 (Signed)
[place] LEVEL OF CARE SCREENING TOOL     IDENTIFICATION  Patient Name: [name] Birthdate: 1950-12-18 Sex: female Admission Date (Current Location): 01/25/2020  [facility] and [place] Number:  Herbalist and Address:  [facility] [place], [place] [address], [place], [place] [address]      Provider Number: [id number]  Attending Physician Name and Address:  Angelica Pou, MD  Relative Name and Phone Number:  [place]    Current Level of Care: Hospital Recommended Level of Care: [place] Prior Approval Number:    Date Approved/Denied:   PASRR Number: [id number]  Discharge Plan: SNF    Current Diagnoses: Patient Active Problem List   Diagnosis Date Noted  . Neck pain 06/04/2019  . Toothache 03/28/2019  . Enlarged lymph node in neck 03/28/2019  . Irritant hand dermatitis 02/03/2019  . Dental caries 10/29/2018  . Perinephric hematoma 09/26/2018  . Nephrolithiasis 09/06/2018  . Vomiting 07/21/2018  . Post concussive syndrome 06/20/2018  . Head trauma 06/18/2018  . Intractable vomiting with nausea 06/18/2018  . Pain in joint of left shoulder 05/15/2018  . Falls 04/19/2018  . Iron deficiency anemia due to chronic blood loss 01/05/2018  . Symptomatic anemia 11/07/2017  . Pressure injury of skin 11/07/2017  . Acute gastric ulcer with hemorrhage   . Pain in left knee 09/12/2017  . Healthcare maintenance 09/09/2017  . Allergic rhinitis 09/09/2017  . Major depressive disorder 07/21/2017  . Dysuria 06/16/2017  . Demand ischemia ([place]) 06/16/2017  . Petechiae 06/16/2017  . Migraine 06/09/2017  . AKI (acute kidney injury) ([place])   . Right foot pain 04/22/2017  . History of total knee replacement, 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  . Neck muscle strain 08/19/2016  . Onychomycosis of multiple toenails with type 2 diabetes mellitus ([place]) 11/04/2015  . Skin lesions 11/04/2015  . Syncope   . Overweight (BMI 25.0-29.9)  05/27/2015  . Leg ulcer, left ([place]) 03/05/2015  . Primary osteoarthritis of left knee 09/22/2014  . Esophageal reflux 07/18/2014  . Hyperlipidemia associated with type 2 diabetes mellitus ([place]) 02/26/2014  . Tobacco use disorder 07/22/2013  . Headache 06/28/2013  . Diabetic peripheral neuropathy associated with type 2 diabetes mellitus ([place]) 06/14/2013  . Hx-TIA (transient ischemic attack) 03/10/2013  . Diabetes mellitus type 2, controlled, with complications ([place]) 34/28/7681  . Hypertension 05/13/2012    Orientation RESPIRATION BLADDER Height & Weight     Self, Time, Situation, Place  Normal (See discharge summary) Continent, External catheter Weight: 183 lb (83 kg) Height:  5\' 6"  (167.6 cm)  BEHAVIORAL SYMPTOMS/MOOD NEUROLOGICAL BOWEL NUTRITION STATUS      Continent Diet (See discharge summary)  AMBULATORY STATUS COMMUNICATION OF NEEDS Skin   Limited Assist Verbally Normal                       Personal Care Assistance Level of Assistance  Bathing, Feeding, Dressing Bathing Assistance: Limited assistance Feeding assistance: Independent Dressing Assistance: Limited assistance     Functional Limitations Info  Sight, Hearing, Speech Sight Info: Adequate Hearing Info: Adequate Speech Info: Adequate    SPECIAL CARE FACTORS FREQUENCY  PT (By licensed PT), OT (By licensed OT)     PT Frequency: 5x a week OT Frequency: 5x a week            Contractures Contractures Info: Not present    Additional Factors Info  Code Status, Allergies Code Status Info: Full Allergies Info: Flexeril (cyclobenzaprine); Propofol;  Soma (carisoprodol)           Current Medications (01/25/2020):  This is the current hospital active medication list Current Facility-Administered Medications  Medication Dose Route Frequency Provider Last Rate Last Admin  . acetaminophen (TYLENOL) tablet 1,000 mg  1,000 mg Oral TID Sanjuan Dame, MD   1,000 mg at 01/25/20 1545  . enoxaparin  (LOVENOX) injection 30 mg  30 mg Subcutaneous Q24H Seawell, Jaimie A, DO   30 mg at 01/25/20 1008  . HYDROmorphone (DILAUDID) injection 0.5 mg  0.5 mg Intravenous Q4H PRN Sanjuan Dame, MD   0.5 mg at 01/25/20 1416  . insulin aspart (novoLOG) injection 0-15 Units  0-15 Units Subcutaneous TID WC Mosetta Anis, MD      . lactated ringers infusion   Intravenous Continuous Mosetta Anis, MD 200 mL/hr at 01/25/20 1546 Rate Change at 01/25/20 1546  . pantoprazole (PROTONIX) EC tablet 40 mg  40 mg Oral BID AC Mosetta Anis, MD      . rosuvastatin (CRESTOR) tablet 40 mg  40 mg Oral Daily Mosetta Anis, MD   40 mg at 01/25/20 1545  . sertraline (ZOLOFT) tablet 100 mg  100 mg Oral Daily Mosetta Anis, MD   100 mg at 01/25/20 1545     Discharge Medications: Please see discharge summary for a list of discharge medications.  Relevant Imaging Results:  Relevant Lab Results:   Additional Information SSN [id number]  Waylan Boga, [place]

## 2020-01-25 NOTE — Progress Notes (Signed)
Subjective:  [name] is a 69 y.o. with PMH of T2DM, TIA, CKD3, recurrent falls, IDA, HTN, HLD, COPD, MDD admit for syncope on hospital day 0  [name] was examined and evaluated at bedside. She mentions having had difficulty social situation with being evicted form her home and living out of her car with her daughter and [name]. She mentions trying to drink a lot of water but mentions difficulty in obtaining supplies. This am she is continuing to endorse pain of her left hip, lower back as well as a headache most severe at the location where she hit her head. She denies any blurry vision or neurological deficits.  Objective:  Vital signs in last 24 hours: Vitals:   01/25/20 0513 01/25/20 0530 01/25/20 0650 01/25/20 0700  BP: 126/69  (!) 151/58 130/69  Pulse: 87 85 79 80  Resp: 13 16 18 18   Temp: 98.1 F (36.7 C)  98.2 F (36.8 C) 98.4 F (36.9 C)  TempSrc: Oral  Oral Oral  SpO2: 96% 97% 98% 98%  Weight:      Height:       Gen: ill-appearing, appear to be in pain HEENT: Area of tenderness with edema and dried blood on occipital area with repaired laceration CV: RRR, S1, S2 normal, No rubs, no murmurs, no gallops Pulm: CTAB, No rales, no wheezes Abd: Soft, BS+, NTND, No rebound, no guarding Extm: ROM intact, Peripheral pulses intact, No peripheral edema Skin: Dry, Warm, poor turgor Neuro: AAOx3  Assessment/Plan:  Active Problems:   Syncope   AKI (acute kidney injury) ([place])  [name] is a 69 y.o. with PMH of T2DM, TIA, CKD3, recurrent falls, IDA, HTN, HLD, COPD, MDD admit for syncope 2/2 orthostatic hypotension in setting of dehydration / hyperthermia  Acute Kidney Injury on CKD 3a likely 2/2 Dehydration Per history, poor oral intake w/ syncope this admission. Admit BUN 25, Creatinine 2.54. Baseline BUN 28, Creatinine 2.54. FeNA 1.5% (after 2L of fluids) consistent w/ intrinsic but may be inaccurate in setting of delay in urine labs. Renal  ultrasound w/o significant findings except for stable R renal cysts. Should improve with continued fluid resuscitation - I/Os - Daily weights - Increase LR to 200c/hr for 10 hours - Trend renal fx - Avoid nephrotoxic meds when able  Syncope likely 2/2 orthostatic hypotension in setting of dehydration / hyperthermia Currently has unstable housing. Has had poor oral intake due to access. Has no housing to provide protection from summer heat. Continues to appear very dry on exam. Orthostatic per EMS on arrival. S/p 1L bolus + maintenance fluids. Will need social work consult to assist with housing issues. - Consult to social work - PT/OT - Increase fluids as above - Encouraged increased oral intake - Hold home anti-hypertensives  Occipital scalp laceration Hip pain Sequelae of syncopal episode and fall. CT head and lumbar spine negative for intracerebral hemorrhage or fracture. ED repaired lac on admission. Management w/ supportive care - Scheduled tylenol - Dilaudid for breakthrough - PT/OT  T2DM Last hgb a1c 8.8. Uncontrolled but improving from prior. On canagliflozin-metformin, degludec at home - SSI - Glucose checks - D/c canagliflozin at discharge as likely contributing to dehydration  Depression On amitriptyline and sertraline at home. Noted to have urinary retention this am - Decrease amitryptyline dose at discharge - C/w sertraline 100mg   DVT prophx: lovenox Diet: Carb-modified Code: Full  Prior to Admission Living Arrangement: Unstable housing Anticipated Discharge Location: SNF Barriers to Discharge: Medical treatment Dispo:  Anticipated discharge in approximately 2-3 day(s).   Julia Anis, MD 01/25/2020, 1:36 PM Pager: [phone] After 5pm on weekdays and 1pm on weekends: On Call Pager: [phone]

## 2020-01-25 NOTE — Evaluation (Signed)
Physical Therapy Evaluation Patient Details Name: [name] MRN: [id number] DOB: 23-Dec-1950 Today's Date: 01/25/2020   History of Present Illness  The pt is a 69 yo female presenting due to a LOC upon standing. Imaging reveals no injury to spine or L hip, but work up revealed AKI. PMH includes: frequent falls (3 times in past 2 weeks), CHF, COPD, HTN, DM II with neropathy, and TIAs.  Clinical Impression  Pt in bed upon arrival of PT, agreeable to evaluation at this time. Prior to admission the pt was independent with use of a walker (pt unsure if 2-wheel or 4-wheel) for longer mobility. The pt now presents with limitations in functional mobility, activity tolerance, strength, stability, and power due to above dx, and will continue to benefit from skilled PT to address these deficits. The pt was able to complete bed mobility, initial sit-stand transfers, and a short bout of ambulation today, but required modA to complete bed mobility and transfers due to poor coordination of mobility and significant limitations in functional strength and power. She also reported consistent headache and dizziness that did not change with positioning, but that limited her ability to progress mobility at this time. The will continue to benefit from skilled PT to progress strength, power, activity tolerance, and stability to reduce risk of falls and subsequent injury following d/c.      Follow Up Recommendations SNF    Equipment Recommendations  Rolling walker with 5" wheels - pt unsure what kind of walker she has, may have 2-wheel or 4-wheel   Recommendations for Other Services       Precautions / Restrictions Precautions Precautions: Fall Precaution Comments: pt reports multiple falls in last few weeks in addition to fall prior to admission Restrictions Weight Bearing Restrictions: No Other Position/Activity Restrictions: spine and L hip imaging negative for injury      Mobility  Bed  Mobility Overal bed mobility: Needs Assistance Bed Mobility: Supine to Sit     Supine to sit: Mod assist     General bed mobility comments: modA to raise trunk from flat HOB with heavy use of rails and cues. Pt with significant L hip pain with mobility  Transfers Overall transfer level: Needs assistance Equipment used: Rolling walker (2 wheeled) Transfers: Sit to/from [facility] Sit to Stand: Mod assist Stand pivot transfers: Min assist       General transfer comment: modA for initial stand, due to pain and reports of dizziness. minA to steady in standing but pt then able to release RW for static stance.  Ambulation/Gait Ambulation/Gait assistance: Min guard Gait Distance (Feet): 5 Feet Assistive device: Rolling walker (2 wheeled) Gait Pattern/deviations: Step-to pattern;Decreased stride length;Shuffle;Trunk flexed   Gait velocity interpretation: <1.31 ft/sec, indicative of household ambulator General Gait Details: pt continues to endorse dizziness (regardless of position) so attemtped short bout of lateral steps to recliner. pt with small steps but no overt LOB. increased WB through RW      Balance Overall balance assessment: Needs assistance Sitting-balance support: Bilateral upper extremity supported;Feet supported Sitting balance-Leahy Scale: Poor   Postural control: Posterior lean Standing balance support: Bilateral upper extremity supported;During functional activity Standing balance-Leahy Scale: Poor Standing balance comment: minA to steady with BUE support during mobility. minA initially, progressed to minG for static stance                             Pertinent Vitals/Pain Pain Assessment: Faces Pain Score:  8  Pain Location: head and hips Pain Descriptors / Indicators: Grimacing;Sore Pain Intervention(s): Monitored during session;Premedicated before session;Repositioned    Home Living Family/patient expects to be discharged to::  Unsure                 Additional Comments: Pt reports currently living "on the streets" with her granddaughter, her fiance, and their 35 yo    Prior Function Level of Independence: Independent         Comments: reports use of walker for longer distances     Hand Dominance   Dominant Hand: Left    Extremity/Trunk Assessment   Upper Extremity Assessment Upper Extremity Assessment: Overall WFL for tasks assessed    Lower Extremity Assessment Lower Extremity Assessment: Overall WFL for tasks assessed (LLE limited by pain, able to use functionally)    Cervical / Trunk Assessment Cervical / Trunk Assessment: Kyphotic  Communication   Communication: No difficulties  Cognition Arousal/Alertness: Awake/alert Behavior During Therapy: Flat affect Overall Cognitive Status: Impaired/Different from baseline Area of Impairment: Problem solving                             Problem Solving: Slow processing;Decreased initiation;Requires verbal cues General Comments: Pt benefits from cues for safety, able to talk about her past and family without any obvious deficits.      General Comments General comments (skin integrity, edema, etc.): Pt educated on orthostatic hypotension and interventions as well as recommendations to reduce stiffness of L hip acutely and following d/c.        Assessment/Plan    PT Assessment Patient needs continued PT services  PT Problem List Decreased strength;Decreased mobility;Decreased safety awareness;Decreased range of motion;Decreased activity tolerance;Decreased balance;Decreased knowledge of use of DME;Pain       PT Treatment Interventions DME instruction;Therapeutic exercise;Gait training;Stair training;Balance training;Functional mobility training;Therapeutic activities;Patient/family education    PT Goals (Current goals can be found in the Care Plan section)  Acute Rehab PT Goals Patient Stated Goal: reduce headache PT Goal  Formulation: With patient Time For Goal Achievement: 02/08/20 Potential to Achieve Goals: Good    Frequency Min 3X/week   Barriers to discharge   pt with unknown housing situation, reports she is "from the streets"       AM-PAC PT "6 Clicks" Mobility  Outcome Measure Help needed turning from your back to your side while in a flat bed without using bedrails?: A Little Help needed moving from lying on your back to sitting on the side of a flat bed without using bedrails?: A Lot Help needed moving to and from a bed to a chair (including a wheelchair)?: A Little Help needed standing up from a chair using your arms (e.g., wheelchair or bedside chair)?: A Lot Help needed to walk in hospital room?: A Lot Help needed climbing 3-5 steps with a railing? : A Lot 6 Click Score: 14    End of Session Equipment Utilized During Treatment: Gait belt Activity Tolerance: Patient tolerated treatment well;Patient limited by pain Patient left: in chair;with call bell/phone within reach Nurse Communication: Mobility status (BP) PT Visit Diagnosis: Repeated falls (R29.6);Muscle weakness (generalized) (M62.81);Difficulty in walking, not elsewhere classified (R26.2);Pain Pain - Right/Left: Left Pain - part of body: Hip (and headache)    Time: 9935-7017 PT Time Calculation (min) (ACUTE ONLY): 31 min   Charges:   PT Evaluation $PT Eval Moderate Complexity: 1 Mod PT Treatments $Gait Training: 8-22 mins  Karma Ganja, PT, DPT   Acute Rehabilitation Department Pager #: [phone]  Otho Bellows 01/25/2020, 1:14 PM

## 2020-01-25 NOTE — ED Notes (Signed)
Attempted report to inpatient floor. Callback number given to RN.

## 2020-01-25 NOTE — TOC Initial Note (Signed)
Transition of Care [facility]) - Initial/Assessment Note    Patient Details  Name: [name] MRN: [id number] Date of Birth: August 01, 1950  Transition of Care [facility]) CM/SW Contact:    Varney Baas Phone Number: 01/25/2020, 4:38 PM  Clinical Narrative:                 CSW met with patient to discuss PT recommendation of SNF placement at discharge. Patient was completely independent with ambulation and ADLs prior to arrival. Patient is understanding of PT recommendation and in agreement with SNF placement at discharge. Patient provided permission to fax referrals to [facility] for placement. Patient is not vaccinated.   Patient was living in a home with her granddaughter, Isidor Holts and granddaughter boyfriend until they were evicted. They have been living in a hotel since the eviction and has assistance from an agency to pay for first months rent and a deposit when housing is found. Boyfriend of granddaughter has recently obtained a job which helped fund a hotel today. Patient receives monthly income on the first of each month. CSW provided patient with resources for housing assistance, as well as shelters. TOC team will continue to assist with discharge planning.    Expected Discharge Plan: Skilled Nursing Facility Barriers to Discharge: Continued Medical Work up   Patient Goals and CMS Choice   CMS Medicare.gov Compare Post Acute Care list provided to:: Patient Choice offered to / list presented to : Patient  Expected Discharge Plan and Services Expected Discharge Plan: [place] arrangements for the past 2 months: Single Family Home                                      Prior Living Arrangements/Services Living arrangements for the past 2 months: Single Family Home Lives with:: Other (Comment) (Granddaughter) Patient language and need for interpreter reviewed:: Yes Do you feel safe going back to the place where you live?:  Yes      Need for Family Participation in Patient Care: Yes (Comment) Care giver support system in place?: Yes (comment)   Criminal Activity/Legal Involvement Pertinent to Current Situation/Hospitalization: No - Comment as needed  Activities of Daily Living      Permission Sought/Granted Permission sought to share information with : Facility Sport and exercise psychologist, Family Supports Permission granted to share information with : Yes, Verbal Permission Granted  Share Information with NAME: [place]  Permission granted to share info w AGENCY: SNF  Permission granted to share info w Relationship: Granddaughter  Permission granted to share info w Contact Information: [phone]  Emotional Assessment Appearance:: Appears stated age Attitude/Demeanor/Rapport: Unable to Assess Affect (typically observed): Appropriate Orientation: : Oriented to Self, Oriented to Place, Oriented to  Time, Oriented to Situation Alcohol / Substance Use: Not Applicable Psych Involvement: No (comment)  Admission diagnosis:  AKI (acute kidney injury) ([place]) [N17.9] Fall, initial encounter [W19.XXXA] Scalp laceration, initial encounter [S01.01XA] Syncope [R55] Patient Active Problem List   Diagnosis Date Noted  . Neck pain 06/04/2019  . Toothache 03/28/2019  . Enlarged lymph node in neck 03/28/2019  . Irritant hand dermatitis 02/03/2019  . Dental caries 10/29/2018  . Perinephric hematoma 09/26/2018  . Nephrolithiasis 09/06/2018  . Vomiting 07/21/2018  . Post concussive syndrome 06/20/2018  . Head trauma 06/18/2018  . Intractable vomiting with nausea 06/18/2018  . Pain in joint of left shoulder 05/15/2018  .  Falls 04/19/2018  . Iron deficiency anemia due to chronic blood loss 01/05/2018  . Symptomatic anemia 11/07/2017  . Pressure injury of skin 11/07/2017  . Acute gastric ulcer with hemorrhage   . Pain in left knee 09/12/2017  . Healthcare maintenance 09/09/2017  . Allergic rhinitis 09/09/2017  .  Major depressive disorder 07/21/2017  . Dysuria 06/16/2017  . Demand ischemia ([place]) 06/16/2017  . Petechiae 06/16/2017  . Migraine 06/09/2017  . AKI (acute kidney injury) ([place])   . Right foot pain 04/22/2017  . History of total knee replacement, 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  . Neck muscle strain 08/19/2016  . Onychomycosis of multiple toenails with type 2 diabetes mellitus ([place]) 11/04/2015  . Skin lesions 11/04/2015  . Syncope   . Overweight (BMI 25.0-29.9) 05/27/2015  . Leg ulcer, left ([place]) 03/05/2015  . Primary osteoarthritis of left knee 09/22/2014  . Esophageal reflux 07/18/2014  . Hyperlipidemia associated with type 2 diabetes mellitus ([place]) 02/26/2014  . Tobacco use disorder 07/22/2013  . Headache 06/28/2013  . Diabetic peripheral neuropathy associated with type 2 diabetes mellitus ([place]) 06/14/2013  . Hx-TIA (transient ischemic attack) 03/10/2013  . Diabetes mellitus type 2, controlled, with complications ([place]) 73/22/0254  . Hypertension 05/13/2012   PCP:  [name], MD Pharmacy:   [facility] [place], [address] [place] [address] Phone: [phone] Fax: [phone]     Social Determinants of Health (SDOH) Interventions    Readmission Risk Interventions Readmission Risk Prevention Plan 09/27/2018 09/26/2018  Transportation Screening - Complete  PCP or Specialist Appt within 5-7 Days Complete -  Home Care Screening - Complete  Medication Review (RN CM) - Referral to Pharmacy  Some recent data might be hidden

## 2020-01-25 NOTE — ED Notes (Signed)
Pt returned from xray

## 2020-01-25 NOTE — ED Notes (Signed)
Pt transported to xray 

## 2020-01-25 NOTE — Progress Notes (Signed)
In and out done,400 cc of yellowish cloudy urine drained out.Specimen sent to lab.

## 2020-01-25 NOTE — ED Triage Notes (Signed)
BIB [facility] from home where pt was sitting on back of truck and once pt stood up, pt fell backwards.  Family states she did not have compete LOC but pt was disoriented.   Pt does have Small Head laceration to back, bleeding controlled.  Pt was hypotensive upon EMS arrival (62 Palp), pt given 500 NS IV and BP rose to 94/50.   Pt does not remember fall but is A&Ox4, GCS 15.  All other VSS with EMS, CBG 109

## 2020-01-25 NOTE — ED Notes (Signed)
Pt transported to ultrasound.

## 2020-01-25 NOTE — Progress Notes (Signed)
NEW ADMISSION NOTE New Admission Note:   Arrival Method:  ED stretcher bed Mental Orientation: Alert and oriented  x4  Telemetry: # 14 Assessment: Completed Skin: Rt shin scab over wound and healing wound at the back of right heel,assessed with Mariea Clonts R.N VK:FMMCR FA Pain : Denies Tubes:None Safety Measures: Safety Fall Prevention Plan has been given, discussed and signed Admission: Completed [facility] Orientation: Patient has been orientated to the room, unit and staff.  Family:  Orders have been reviewed and implemented. Will continue to monitor the patient. Call light has been placed within reach and bed alarm has been activated.   [place], Zenon Mayo, RN

## 2020-01-26 LAB — CBC
HCT: 35.1 % — ABNORMAL LOW (ref 36.0–46.0)
Hemoglobin: 11.2 g/dL — ABNORMAL LOW (ref 12.0–15.0)
MCH: 29.4 pg (ref 26.0–34.0)
MCHC: 31.9 g/dL (ref 30.0–36.0)
MCV: 92.1 fL (ref 80.0–100.0)
Platelets: 119 10*3/uL — ABNORMAL LOW (ref 150–400)
RBC: 3.81 MIL/uL — ABNORMAL LOW (ref 3.87–5.11)
RDW: 14.6 % (ref 11.5–15.5)
WBC: 5.4 10*3/uL (ref 4.0–10.5)
nRBC: 0 % (ref 0.0–0.2)

## 2020-01-26 LAB — GLUCOSE, CAPILLARY
Glucose-Capillary: 160 mg/dL — ABNORMAL HIGH (ref 70–99)
Glucose-Capillary: 142 mg/dL — ABNORMAL HIGH (ref 70–99)
Glucose-Capillary: 102 mg/dL — ABNORMAL HIGH (ref 70–99)
Glucose-Capillary: 108 mg/dL — ABNORMAL HIGH (ref 70–99)

## 2020-01-26 LAB — BASIC METABOLIC PANEL
Anion gap: 8 (ref 5–15)
BUN: 21 mg/dL (ref 8–23)
CO2: 21 mmol/L — ABNORMAL LOW (ref 22–32)
Calcium: 8.8 mg/dL — ABNORMAL LOW (ref 8.9–10.3)
Chloride: 110 mmol/L (ref 98–111)
Creatinine, Ser: 1.4 mg/dL — ABNORMAL HIGH (ref 0.44–1.00)
GFR calc Af Amer: 45 mL/min — ABNORMAL LOW (ref >60)
GFR calc non Af Amer: 39 mL/min — ABNORMAL LOW (ref >60)
Glucose, Bld: 166 mg/dL — ABNORMAL HIGH (ref 70–99)
Potassium: 4.5 mmol/L (ref 3.5–5.1)
Sodium: 139 mmol/L (ref 135–145)

## 2020-01-26 MED ORDER — CAMPHOR-MENTHOL 0.5-0.5 % EX LOTN
TOPICAL_LOTION | CUTANEOUS | Status: DC | PRN
  Filled 2020-01-26: qty 222

## 2020-01-26 MED ORDER — ENOXAPARIN SODIUM 40 MG/0.4ML ~~LOC~~ SOLN
40.0000 mg | SUBCUTANEOUS | Status: DC
  Administered 2020-01-27 – 2020-01-28 (×2): 40 mg via SUBCUTANEOUS
  Filled 2020-01-26 (×2): qty 0.4

## 2020-01-26 MED ORDER — OXYCODONE HCL 5 MG PO TABS
5.0000 mg | ORAL_TABLET | ORAL | Status: DC | PRN
  Administered 2020-01-26 – 2020-01-28 (×6): 5 mg via ORAL
  Filled 2020-01-26 (×6): qty 1

## 2020-01-26 MED ORDER — GABAPENTIN 400 MG PO CAPS
400.0000 mg | ORAL_CAPSULE | Freq: Three times a day (TID) | ORAL | Status: DC
  Administered 2020-01-26 – 2020-01-28 (×8): 400 mg via ORAL
  Filled 2020-01-26 (×8): qty 1

## 2020-01-26 MED ORDER — LACTATED RINGERS IV SOLN: INTRAVENOUS | Status: AC

## 2020-01-26 MED ORDER — HYDROMORPHONE HCL 1 MG/ML PO LIQD: 0.5000 mg | ORAL | Status: DC | PRN

## 2020-01-26 NOTE — Evaluation (Signed)
Occupational Therapy Evaluation Patient Details Name: [name] MRN: [id number] DOB: 05/01/1951 Today's Date: 01/26/2020    History of Present Illness The pt is a 69 yo female presenting due to a LOC upon standing. Imaging reveals no injury to spine or L hip, but work up revealed AKI. PMH includes: frequent falls (3 times in past 2 weeks), CHF, COPD, HTN, DM II with neropathy, and TIAs.   Clinical Impression   Pt admitted with the above diagnoses and presents with below problem list. Pt will benefit from continued acute OT to address the below listed deficits and maximize independence with basic ADLs prior to d/c to venue below. PTA pt was independent to mod I with ADLs. Pt limited by symptomatic orthostatic hypotension. BP sitting EOB 154/86, standing at 0 minutes 128/77. Currently pt is min A with LB ADLs, close min guard to min A with stand-pivot transfers to access BSC.      Follow Up Recommendations  SNF    Equipment Recommendations  Other (comment) (defer to next venue)    Recommendations for Other Services       Precautions / Restrictions Precautions Precautions: Fall Precaution Comments: pt reports multiple falls in last few weeks in addition to fall prior to admission Restrictions Weight Bearing Restrictions: No Other Position/Activity Restrictions: spine and L hip imaging negative for injury      Mobility Bed Mobility Overal bed mobility: Needs Assistance Bed Mobility: Supine to Sit;Sit to Supine     Supine to sit: Mod assist Sit to supine: Min guard   General bed mobility comments: mod A to powerup trunk "this is the part I have a hard time with".   Transfers Overall transfer level: Needs assistance Equipment used: Rolling walker (2 wheeled) Transfers: Sit to/from [facility] Sit to Stand: Min assist Stand pivot transfers: Min assist       General transfer comment: close min guard to min A to steady and control descent. cues for  technique and hand placement with rw    Balance Overall balance assessment: Needs assistance Sitting-balance support: Bilateral upper extremity supported;Feet supported Sitting balance-Leahy Scale: Poor Sitting balance - Comments: BUE for balance Postural control: Posterior lean Standing balance support: Bilateral upper extremity supported;During functional activity Standing balance-Leahy Scale: Poor Standing balance comment: min A to close min guard. utilized rw                           ADL either performed or assessed with clinical judgement   ADL Overall ADL's : Needs assistance/impaired Eating/Feeding: Set up;Sitting   Grooming: Set up;Sitting   Upper Body Bathing: Set up;Sitting;Min guard   Lower Body Bathing: Minimal assistance;Sit to/from stand   Upper Body Dressing : Set up;Min guard;Sitting   Lower Body Dressing: Minimal assistance;Sit to/from stand   Toilet Transfer: Stand-pivot;BSC;RW;Minimal assistance   Toileting- Water quality scientist and Hygiene: Min guard;Sit to/from stand;Minimal assistance         General ADL Comments: Pt completed bed mobility, orthostatic vitals check then SPT EOB<>BSC and pericare in standing. Pt r/o dizziness in sitting/standing positions     Vision Patient Visual Report: No change from baseline Additional Comments: pt reports that she has double vision at baseline and has been told she needs special (prism?) glassess but cannot afford them . Pt also endorses baseline sensitivity to light     Perception     Praxis      Pertinent Vitals/Pain Pain Assessment: Faces Faces Pain  Scale: Hurts even more Pain Location: head and hips Pain Descriptors / Indicators: Grimacing;Sore Pain Intervention(s): Monitored during session;Repositioned;Limited activity within patient's tolerance     Hand Dominance Left   Extremity/Trunk Assessment Upper Extremity Assessment Upper Extremity Assessment: Generalized weakness;Overall  [facility] for tasks assessed   Lower Extremity Assessment Lower Extremity Assessment: Defer to PT evaluation   Cervical / Trunk Assessment Cervical / Trunk Assessment: Kyphotic   Communication Communication Communication: No difficulties   Cognition Arousal/Alertness: Awake/alert Behavior During Therapy: Flat affect Overall Cognitive Status: Within Functional Limits for tasks assessed                                     General Comments       Exercises     Shoulder Instructions      Home Living Family/patient expects to be discharged to:: Skilled nursing facility                                 Additional Comments: Pt reports currently living "on the streets" with her granddaughter, her fiance, and their 35 yo      Prior Functioning/Environment Level of Independence: Independent        Comments: reports use of walker for longer distances        OT Problem List: Decreased strength;Decreased activity tolerance;Impaired balance (sitting and/or standing);Decreased knowledge of use of DME or AE;Decreased knowledge of precautions;Cardiopulmonary status limiting activity;Pain      OT Treatment/Interventions: Self-care/ADL training;Energy conservation;DME and/or AE instruction;Therapeutic activities;Patient/family education;Balance training    OT Goals(Current goals can be found in the care plan section) Acute Rehab OT Goals Patient Stated Goal: feel better OT Goal Formulation: With patient Time For Goal Achievement: 02/09/20 Potential to Achieve Goals: Good ADL Goals Pt Will Perform Upper Body Bathing: with set-up;sitting Pt Will Perform Lower Body Bathing: with modified independence;sit to/from stand Pt Will Transfer to Toilet: with supervision;ambulating Pt Will Perform Toileting - Clothing Manipulation and hygiene: with supervision;sit to/from stand Additional ADL Goal #1: Pt will complete bed mobility at supervision level to prepare for  OOB/EOB ADLs.  OT Frequency: Min 2X/week   Barriers to D/C:            Co-evaluation              AM-PAC OT "6 Clicks" Daily Activity     Outcome Measure Help from another person eating meals?: None Help from another person taking care of personal grooming?: A Little Help from another person toileting, which includes using toliet, bedpan, or urinal?: A Little Help from another person bathing (including washing, rinsing, drying)?: A Little Help from another person to put on and taking off regular upper body clothing?: A Little Help from another person to put on and taking off regular lower body clothing?: A Little 6 Click Score: 19   End of Session Equipment Utilized During Treatment: Rolling walker Nurse Communication: Other (comment) (orthostatic bp readings and symptomatic)  Activity Tolerance: Other (comment) (symptomatic orthostatic hypotension) Patient left: in bed;with call bell/phone within reach;with bed alarm set  OT Visit Diagnosis: Unsteadiness on feet (R26.81);History of falling (Z91.81);Muscle weakness (generalized) (M62.81);Pain                Time: 7096-2836 OT Time Calculation (min): 30 min Charges:  OT General Charges $OT Visit: 1 Visit OT Evaluation $OT Eval Low Complexity:  1 Low OT Treatments $Self Care/Home Management : 8-22 mins  Tyrone Schimke, OT Acute Rehabilitation Services Pager: [phone] Office: [phone]   Hortencia Pilar 01/26/2020, 1:11 PM

## 2020-01-26 NOTE — Progress Notes (Signed)
Subjective:  Interviewed patient at bedside.  She reports having a lot of urine over the night so much so that her bedding needed to be changed. She reported significant pain from her head injury, and itching from she thinks poison ivy. She reports significant burning pain in her feet due to not having her gabapentin for over a day.  Objective:  Vital signs in last 24 hours: Vitals:   01/25/20 0700 01/25/20 1828 01/25/20 1956 01/26/20 0552  BP: 130/69 111/61 122/68 (!) 154/85  Pulse: 80 76 71 73  Resp: 18 18 16 18   Temp: 98.4 F (36.9 C) (!) 97.5 F (36.4 C) (!) 97.5 F (36.4 C) 97.7 F (36.5 C)  TempSrc: Oral Oral Oral Oral  SpO2: 98% 96% 94% 98%  Weight:      Height:       Physical Exam Vitals and nursing note reviewed.  Constitutional:      General: She is not in acute distress.    Appearance: Normal appearance. She is not ill-appearing or toxic-appearing.  Cardiovascular:     Rate and Rhythm: Normal rate and regular rhythm.     Pulses: Normal pulses.          Radial pulses are 2+ on the right side and 2+ on the left side.       Dorsalis pedis pulses are 2+ on the right side and 2+ on the left side.     Heart sounds: Normal heart sounds, S1 normal and S2 normal. No murmur heard.   Musculoskeletal:     Right lower leg: No edema.     Left lower leg: No edema.  Neurological:     Mental Status: She is alert.      Assessment/Plan:  Active Problems:   Syncope   AKI (acute kidney injury) ([place])   [name] is a 69 y.o. with PMH of T2DM, TIA, CKD3, recurrent falls, IDA, HTN, HLD, COPD, MDD admit for syncope 2/2 orthostatic hypotension in setting of dehydration / hyperthermia.   Acute Kidney Injury on CKD 3a likely 2/2 Dehydration: Per history, poor oral intake w/ syncope this admission. Creatinine today 1.40, trending towards baseline. -I/Os -Daily weights -Continue LR at 100c/hr for 10 hours -Trend renal function -Avoid nephrotoxic meds when  able   Syncope likely 2/2 orthostatic hypotension in setting of dehydration/hyperthermia: Currently has unstable housing. Has had poor oral intake due to access. Has no housing to provide protection from summer heat. Is improved but still dry on exam. Orthostatic per EMS on arrival. Will need social work consult to assist with housing issues. -Consult to social work -PT/OT -Continue LR at 100c/hr for 10 hours -Encouraged increased oral intake -Hold home anti-hypertensives   Occipital scalp laceration/Hip pain: Sequelae of syncopal episode and fall. CT head and lumbar spine negative for intracerebral hemorrhage or fracture. ED repaired lac on admission. Management w/ supportive care -Scheduled tylenol -Dilaudid for breakthrough -PT/OT   T2DM: Last hgb a1c 8.8. Uncontrolled but improving from prior. On canagliflozin-metformin, degludec at home -SSI -Glucose checks -Plan to stop canagliflozin at discharge as likely contributing to dehydration   Depression: On amitriptyline and sertraline at home. Noted to have urinary retention this am -Decrease amitryptyline dose at discharge -Continu sertraline 100mg    DVT prophx: lovenox Diet: Carb-modified Code: Full   Prior to Admission Living Arrangement: Unstable housing Anticipated Discharge Location: SNF Barriers to Discharge: Continued medical care Dispo: Anticipated discharge in approximately 2-4 day(s).   Briant Cedar, MD 01/26/2020, 6:27  AM Pager: [phone] After 5pm on weekdays and 1pm on weekends: On Call pager [phone]

## 2020-01-27 ENCOUNTER — Ambulatory Visit: Payer: Medicare Other | Admitting: Internal Medicine

## 2020-01-27 ENCOUNTER — Ambulatory Visit: Payer: Medicare Other

## 2020-01-27 ENCOUNTER — Telehealth: Payer: Medicare Other

## 2020-01-27 DIAGNOSIS — Z9181 History of falling: Secondary | ICD-10-CM

## 2020-01-27 DIAGNOSIS — Z7984 Long term (current) use of oral hypoglycemic drugs: Secondary | ICD-10-CM

## 2020-01-27 DIAGNOSIS — D509 Iron deficiency anemia, unspecified: Secondary | ICD-10-CM

## 2020-01-27 DIAGNOSIS — Z8673 Personal history of transient ischemic attack (TIA), and cerebral infarction without residual deficits: Secondary | ICD-10-CM

## 2020-01-27 DIAGNOSIS — E118 Type 2 diabetes mellitus with unspecified complications: Secondary | ICD-10-CM

## 2020-01-27 DIAGNOSIS — I10 Essential (primary) hypertension: Secondary | ICD-10-CM

## 2020-01-27 DIAGNOSIS — F329 Major depressive disorder, single episode, unspecified: Secondary | ICD-10-CM

## 2020-01-27 DIAGNOSIS — E785 Hyperlipidemia, unspecified: Secondary | ICD-10-CM

## 2020-01-27 LAB — BASIC METABOLIC PANEL
Anion gap: 9 (ref 5–15)
BUN: 15 mg/dL (ref 8–23)
CO2: 20 mmol/L — ABNORMAL LOW (ref 22–32)
Calcium: 8.6 mg/dL — ABNORMAL LOW (ref 8.9–10.3)
Chloride: 109 mmol/L (ref 98–111)
Creatinine, Ser: 1.26 mg/dL — ABNORMAL HIGH (ref 0.44–1.00)
GFR calc Af Amer: 51 mL/min — ABNORMAL LOW (ref >60)
GFR calc non Af Amer: 44 mL/min — ABNORMAL LOW (ref >60)
Glucose, Bld: 114 mg/dL — ABNORMAL HIGH (ref 70–99)
Potassium: 4 mmol/L (ref 3.5–5.1)
Sodium: 138 mmol/L (ref 135–145)

## 2020-01-27 LAB — GLUCOSE, CAPILLARY
Glucose-Capillary: 104 mg/dL — ABNORMAL HIGH (ref 70–99)
Glucose-Capillary: 111 mg/dL — ABNORMAL HIGH (ref 70–99)
Glucose-Capillary: 206 mg/dL — ABNORMAL HIGH (ref 70–99)

## 2020-01-27 MED ORDER — LACTATED RINGERS IV SOLN: INTRAVENOUS | Status: AC

## 2020-01-27 NOTE — Patient Instructions (Signed)
Visit Information  Goals Addressed              This Visit's Progress   .  "We have been evicted from our house and have nowhere to live" (pt-stated)        Current Barriers:  Marland Kitchen Knowledge Barriers related to resources available to address need for emergency housing.  Patient reports that she, granddaughter, granddaughter's fiance, and great grandson were all living in a rental house.  Patient reports that granddaughter has significant mental health challenges which resulted in police being called multiple times.  States that neighbors also called the city to complain about condition of yard.  These factors resulted in them being evicted.  They have stayed in a motel for the last two weeks but patient's monthly income has been exhausted.  Family is assisting patient today with moving remaining belongings from home as all belongings have to be out by the end of the day.  Will be officially homeless as of tomorrow morning.  Per patient, Granddaughter is unable to work due to mental and physical health challenges.  Her fiance has part time employment at [employer].  Patient does not qualify for food stamps but granddaughter and her fiance do receive them. Patient's monthly income is $1258.   . 01/23/20:  Patient reports that granddaughter is part of a "parenting group" that paid for them to reside in a motel until tomorrow morning.  This same group has agreed to assist with first month of rent and deposit if they locate new rental property.  Patient states that she and granddaughter are actively searching rental properties.   Case Manager Clinical Goal(s):  Marland Kitchen Over the next 180 days, patient will work with BSW to address needs related to Housing barriers . Over the next 180 days, BSW will collaborate with RN Care Manager to address care management and care coordination needs  Interventions:  . Added note to [facility] referral via [employer] [place] platform to inform staff of patient's current  hospitalization . Informed [facility] staff that patient is being recommended for SNF but will very likely still need housing assistance upon discharge  Patient Self Care Activities:  . Self administers medications as prescribed .   Please see past updates related to this goal by clicking on the "Past Updates" button in the selected goal        Will continue to monitor patient's chart for discharge plan.

## 2020-01-27 NOTE — Plan of Care (Signed)
°  Problem: Safety: °Goal: Ability to remain free from injury will improve °Outcome: Progressing °  °Problem: Activity: °Goal: Risk for activity intolerance will decrease °Outcome: Progressing °  °

## 2020-01-27 NOTE — Discharge Summary (Signed)
Name: [name] MRN: [id number] DOB: [birth year] 69 y.o. PCP: [name], MD  Date of Admission: 01/25/2020  1:16 AM Date of Discharge: 01/28/2020 Attending Physician: Aldine Contes, MD  Discharge Diagnosis: 1. Syncope likely 2/2 orthostatic hypotension in setting of dehydration/ hyperthermia 2. Acute Kidney Injuryon CKD 3a likely2/2 Dehydration 3. Occipital scalp laceration/Hip pain  Discharge Medications: Allergies as of 01/28/2020      Reactions   Flexeril [cyclobenzaprine] Other (See Comments)   Prolonged QTc to 571, tachycardia   Propofol Other (See Comments)   Hallucinations.   Soma [carisoprodol] Itching, Rash      Medication List    STOP taking these medications   amitriptyline 50 MG tablet Commonly known as: ELAVIL   cetirizine 5 MG tablet Commonly known as: ZYRTEC   Invokamet XR 150-500 MG Tb24 Generic drug: Canagliflozin-metFORMIN HCl ER     TAKE these medications   albuterol 108 (90 Base) MCG/ACT inhaler Commonly known as: VENTOLIN HFA Inhale 1-2 puffs into the lungs every 6 (six) hours as needed for wheezing or shortness of breath.   amLODipine 10 MG tablet Commonly known as: NORVASC TAKE 1 TABLET BY MOUTH EVERY DAY   cephALEXin 500 MG capsule Commonly known as: KEFLEX Take 1 capsule (500 mg total) by mouth every 8 (eight) hours.   fluticasone 50 MCG/ACT nasal spray Commonly known as: Flonase Place 2 sprays into both nostrils daily.   gabapentin 400 MG capsule Commonly known as: NEURONTIN Take 1 capsule (400 mg total) by mouth 3 (three) times daily.   HYDROcodone-acetaminophen 5-325 MG tablet Commonly known as: NORCO/VICODIN Take 1 tablet by mouth every 4 (four) hours as needed.   lisinopril 5 MG tablet Commonly known as: ZESTRIL Take 1 tablet (5 mg total) by mouth daily.   loratadine 10 MG tablet Commonly known as: CLARITIN Take 1 tablet (10 mg total) by mouth daily. Start taking on: January 29, 2020   NovoFine  Plus 32G X 4 MM Misc Generic drug: Insulin Pen Needle 1 Units by Does not apply route every morning.   OneTouch Delica Plus [id number] Misc 1 each by Does not apply route 2 (two) times daily.   OneTouch Verio test strip Generic drug: glucose blood Use as instructed   pantoprazole 40 MG tablet Commonly known as: PROTONIX TAKE 1 TABLET BY MOUTH TWICE DAILY   rosuvastatin 40 MG tablet Commonly known as: CRESTOR TAKE 1 TABLET(40 MG) BY MOUTH DAILY What changed: See the new instructions.   sertraline 100 MG tablet Commonly known as: ZOLOFT TAKE 1 TABLET(100 MG) BY MOUTH DAILY What changed:   how much to take  how to take this  when to take this   Xultophy 100-3.6 UNIT-MG/ML Sopn Generic drug: Insulin Degludec-Liraglutide Inject 18 Units into the skin daily.       Disposition and follow-up:   [name] was discharged from [facility] in Stable condition.  At the hospital follow up visit please address:  1.  Syncope likely 2/2 orthostatic hypotension in setting of dehydration/ hyperthermia: Obtain orthostatics. Provide any resources available to help with lack of resources.  Acute Kidney Injuryon CKD 3a likely2/2 Dehydration: Has returned to baseline. Further follow up with Nephrology if function continues to decline.  Occipital scalp laceration/Hip pain: Ensure walking is not impaired. Ensure laceration on head has healed properly.  2.  Labs / imaging needed at time of follow-up: BMP, Further imaging if pain has not resolved from head or hip.  3.  Pending  labs/ test needing follow-up: None  Follow-up Appointments:  Contact information for after-discharge care    Destination    [facility] Preferred SNF .   Service: Skilled Nursing Contact information: [address] [place] [facility] Course by problem list: 1. Syncope likely 2/2 orthostatic hypotension in  setting of dehydration/ hyperthermia: Due to housing insecurity has had poor nutrition. Was given several liters of fluid. Orthostatics did not resolve. PT/OT recommended SNF to further work on safely moving and noting getting further syncopal episodes.   2.Acute Kidney Injuryon CKD 3a likely2/2 Dehydration: On admission Creatinine was found to be 2.54. Due to housing insecurity has had poor nutrition. Was given fluid and at time of discharge was 1.26.   3. Occipital scalp laceration/Hip pain: Result of her syncopal episode and fall. CT negative for intracerebral hemorrhage or fracture. ED repaired Laceration on admission. Managed with supportive care.  Discharge Vitals:   BP (!) 189/103 (BP Location: Right Arm)   Pulse 75   Temp 98.6 F (37 C) (Oral)   Resp 18   Ht 5\' 6"  (1.676 m)   Wt 84.8 kg   SpO2 96%   BMI 30.18 kg/m   Pertinent Labs, Studies, and Procedures:  CBC Latest Ref Rng & Units 01/26/2020 01/25/2020 01/25/2020  WBC 4.0 - 10.5 K/uL 5.4 7.7 10.1  Hemoglobin 12.0 - 15.0 g/dL 11.2(L) 10.5(L) 12.8  Hematocrit 36 - 46 % 35.1(L) 35.3(L) 43.8  Platelets 150 - 400 K/uL 119(L) 127(L) 188   BMP Latest Ref Rng & Units 01/27/2020 01/26/2020 01/25/2020  Glucose 70 - 99 mg/dL 114(H) 166(H) 91  BUN 8 - 23 mg/dL 15 21 25(H)  Creatinine 0.44 - 1.00 mg/dL 1.26(H) 1.40(H) 2.11(H)  BUN/Creat Ratio 12 - 28 - - -  Sodium 135 - 145 mmol/L 138 139 138  Potassium 3.5 - 5.1 mmol/L 4.0 4.5 4.2  Chloride 98 - 111 mmol/L 109 110 111  CO2 22 - 32 mmol/L 20(L) 21(L) 18(L)  Calcium 8.9 - 10.3 mg/dL 8.6(L) 8.8(L) 8.4(L)   CT head WO Contrast: No CT evidence for acute intracranial abnormality. Atrophy and mild chronic small vessel ischemic changes of the white matter. Moderate occipital scalp laceration and hematoma.   DG Lumbar Spine Complete: No radiographic evidence for acute traumatic injury within the lumbar spine. Moderate multilevel degenerative spondylosis, most pronounced at L2-3 and  L5-S1.   US Renal: Right renal cysts stable from prior exam. No acute abnormality noted.   Discharge Instructions: Discharge Instructions    Call MD for:   Complete by: As directed    Call MD for:  difficulty breathing, headache or visual disturbances   Complete by: As directed    Call MD for:  extreme fatigue   Complete by: As directed    Call MD for:  hives   Complete by: As directed    Call MD for:  persistant dizziness or light-headedness   Complete by: As directed    Call MD for:  persistant nausea and vomiting   Complete by: As directed    Call MD for:  redness, tenderness, or signs of infection (pain, swelling, redness, odor or green/yellow discharge around incision site)   Complete by: As directed    Call MD for:  severe uncontrolled pain   Complete by: As directed    Call MD for:  temperature >100.4   Complete by:  As directed    Diet - low sodium heart healthy   Complete by: As directed    Discharge instructions   Complete by: As directed    Dear Mrs. [name], It was a pleasure taking care of you. You were admitted for syncope due to orthostatic hypotension. We gave you IV fluids due to dehydration. However you still had issues with dizziness when standing up. That is why going to this rehab facility will be important to better learn how to move around safer.  Thank you very much   Increase activity slowly   Complete by: As directed       Signed: Briant Cedar, MD 01/28/2020, 2:44 PM   Pager: [phone]

## 2020-01-27 NOTE — Progress Notes (Addendum)
° °  Subjective:  Interviewed patient at bedside.  Reports she continues to itch, but says lotion helps temporarily. Reports she feels good except when she stands. Reports being able to urinate without difficulty.   Objective:  Vital signs in last 24 hours: Vitals:   01/26/20 0910 01/26/20 1625 01/26/20 2102 01/27/20 0443  BP: (!) 156/78 128/64 (!) 151/77 133/81  Pulse: 79 69 64 71  Resp: 18 18 16 16   Temp: 98.2 F (36.8 C) 98.2 F (36.8 C) 98.1 F (36.7 C) 98.3 F (36.8 C)  TempSrc: Oral Oral Oral Oral  SpO2: 92% 92% 95% 94%  Weight:      Height:       Physical Exam Vitals and nursing note reviewed.  Constitutional:      General: She is not in acute distress.    Appearance: Normal appearance. She is normal weight. She is not ill-appearing or toxic-appearing.  Cardiovascular:     Rate and Rhythm: Normal rate and regular rhythm.     Pulses: Normal pulses.          Radial pulses are 2+ on the right side and 2+ on the left side.       Dorsalis pedis pulses are 2+ on the right side and 2+ on the left side.     Heart sounds: Normal heart sounds, S1 normal and S2 normal. No murmur heard.   Pulmonary:     Effort: Pulmonary effort is normal. No respiratory distress.     Breath sounds: Normal breath sounds. No wheezing.  Musculoskeletal:     Right lower leg: No edema.     Left lower leg: No edema.  Neurological:     Mental Status: She is alert. Mental status is at baseline.      Assessment/Plan:  Active Problems:   Syncope   AKI (acute kidney injury) ([place])   Shagun Wordell Spradlinis a 69 y.o.with PMH of T2DM, TIA, CKD3, recurrent falls, IDA, HTN, HLD, COPD, MDDadmit for syncope 2/2 orthostatic hypotension in setting of dehydration / hyperthermia.   Acute Kidney Injuryon CKD 3a likely2/2 Dehydration: Per history, poor oral intake w/ syncopethis admission. Creatinine today 1.40, trending towards baseline. -I/Os -Daily weights -Continue LR at 250c/hr for 4  hours -Trend renal function -Avoid nephrotoxic meds when able   Syncope likely 2/2 orthostatic hypotension in setting of dehydration/hyperthermia: OT reports continued orthostatics yesterday. Will give an additional Liter of LR and then have orthostatics remeasured. -Consult to social work -Continue working with PT/OT -Continue LR at 250c/hr for 4 hours -Encouraged increased oral intake -Hold home anti-hypertensives   Occipital scalp laceration/Hip pain: Sequelae of syncopal episode and fall. CT head and lumbar spine negative for intracerebral hemorrhage or fracture. ED repaired lac on admission. Management w/ supportive care -Scheduled tylenol -Dilaudid for breakthrough -Continue working with PT/OT   T2DM: Last hgb a1c 8.8. Uncontrolled but improving from prior. On canagliflozin-metformin, degludec at home -SSI -Glucose checks -Plan to stop canagliflozin at discharge as likely contributing to dehydration   Depression: On amitriptyline and sertraline at home. Not having urinary retention. -Amitryptyline has been stopped -Continu sertraline 100mg    DVT prophx:lovenox Diet:Carb-modified Code:Full   Prior to Admission Living Arrangement: Unstable housing Anticipated Discharge Location: SNF Barriers to Discharge: Orthostatics Dispo: Anticipated discharge today.   Briant Cedar, MD 01/27/2020, 6:06 AM Pager: [phone] After 5pm on weekdays and 1pm on weekends: On Call pager [phone]

## 2020-01-27 NOTE — Progress Notes (Signed)
Internal Medicine Clinic Resident  I have personally reviewed this encounter including the documentation in this note and/or discussed this patient with the care management provider. I will address any urgent items identified by the care management provider and will communicate my actions to the patient's PCP. I have reviewed the patient's CCM visit with my supervising attending, Dr Philipp Ovens.  Andrew Au, MD 01/27/2020

## 2020-01-27 NOTE — Chronic Care Management (AMB) (Signed)
Care Management   Follow Up Note   01/27/2020 Name: [name] MRN: [id number] DOB: 12/12/1950  Referred by: Julia Beard, MD Reason for referral : Care Coordination (Housing)   [name] is a 69 y.o. year old female who is a primary care patient of Julia Beard, MD. The care management team was consulted for assistance with care management and care coordination needs.    Review of patient status, including review of consultants reports, relevant laboratory and other test results, and collaboration with appropriate care team members and the patient's provider was performed as part of comprehensive patient evaluation and provision of chronic care management services.    SDOH (Social Determinants of Health) assessments performed: No See Care Plan activities for detailed interventions related to [facility])     Advanced Directives: See Care Plan and Vynca application for related entries.   Goals Addressed              This Visit's Progress   .  "We have been evicted from our house and have nowhere to live" (pt-stated)        Current Barriers:  Marland Kitchen Knowledge Barriers related to resources available to address need for emergency housing.  Patient reports that she, granddaughter, granddaughter's fiance, and great grandson were all living in a rental house.  Patient reports that granddaughter has significant mental health challenges which resulted in police being called multiple times.  States that neighbors also called the city to complain about condition of yard.  These factors resulted in them being evicted.  They have stayed in a motel for the last two weeks but patient's monthly income has been exhausted.  Family is assisting patient today with moving remaining belongings from home as all belongings have to be out by the end of the day.  Will be officially homeless as of tomorrow morning.  Per patient, Granddaughter is unable to work due to mental and physical health  challenges.  Her fiance has part time employment at [employer].  Patient does not qualify for food stamps but granddaughter and her fiance do receive them. Patient's monthly income is $1258.   . 01/23/20:  Patient reports that granddaughter is part of a "parenting group" that paid for them to reside in a motel until tomorrow morning.  This same group has agreed to assist with first month of rent and deposit if they locate new rental property.  Patient states that she and granddaughter are actively searching rental properties.   Case Manager Clinical Goal(s):  Marland Kitchen Over the next 180 days, patient will work with BSW to address needs related to Housing barriers . Over the next 180 days, BSW will collaborate with RN Care Manager to address care management and care coordination needs  Interventions:  . Added note to [facility] referral via [employer] [place] platform to inform staff of patient's current hospitalization . Informed [facility] staff that patient is being recommended for SNF but will very likely still need housing assistance upon discharge  Patient Self Care Activities:  . Self administers medications as prescribed .   Please see past updates related to this goal by clicking on the "Past Updates" button in the selected goal          Will continue to monitor patient's chart regarding discharge plan    [place], [place] Worker [place] [phone]

## 2020-01-28 DIAGNOSIS — S0101XA Laceration without foreign body of scalp, initial encounter: Secondary | ICD-10-CM

## 2020-01-28 DIAGNOSIS — M2550 Pain in unspecified joint: Secondary | ICD-10-CM | POA: Diagnosis not present

## 2020-01-28 DIAGNOSIS — N1831 Chronic kidney disease, stage 3a: Secondary | ICD-10-CM

## 2020-01-28 DIAGNOSIS — E118 Type 2 diabetes mellitus with unspecified complications: Secondary | ICD-10-CM | POA: Diagnosis not present

## 2020-01-28 DIAGNOSIS — F339 Major depressive disorder, recurrent, unspecified: Secondary | ICD-10-CM | POA: Diagnosis not present

## 2020-01-28 DIAGNOSIS — Z23 Encounter for immunization: Secondary | ICD-10-CM | POA: Diagnosis not present

## 2020-01-28 DIAGNOSIS — I951 Orthostatic hypotension: Secondary | ICD-10-CM | POA: Diagnosis not present

## 2020-01-28 DIAGNOSIS — N179 Acute kidney failure, unspecified: Secondary | ICD-10-CM | POA: Diagnosis not present

## 2020-01-28 DIAGNOSIS — R55 Syncope and collapse: Secondary | ICD-10-CM | POA: Diagnosis not present

## 2020-01-28 DIAGNOSIS — E860 Dehydration: Principal | ICD-10-CM

## 2020-01-28 DIAGNOSIS — M6281 Muscle weakness (generalized): Secondary | ICD-10-CM | POA: Diagnosis not present

## 2020-01-28 DIAGNOSIS — M25559 Pain in unspecified hip: Secondary | ICD-10-CM

## 2020-01-28 DIAGNOSIS — I129 Hypertensive chronic kidney disease with stage 1 through stage 4 chronic kidney disease, or unspecified chronic kidney disease: Secondary | ICD-10-CM | POA: Diagnosis not present

## 2020-01-28 DIAGNOSIS — I10 Essential (primary) hypertension: Secondary | ICD-10-CM | POA: Diagnosis not present

## 2020-01-28 DIAGNOSIS — E1122 Type 2 diabetes mellitus with diabetic chronic kidney disease: Secondary | ICD-10-CM

## 2020-01-28 DIAGNOSIS — W19XXXA Unspecified fall, initial encounter: Secondary | ICD-10-CM | POA: Diagnosis not present

## 2020-01-28 DIAGNOSIS — D509 Iron deficiency anemia, unspecified: Secondary | ICD-10-CM | POA: Diagnosis not present

## 2020-01-28 DIAGNOSIS — R531 Weakness: Secondary | ICD-10-CM | POA: Diagnosis not present

## 2020-01-28 DIAGNOSIS — J449 Chronic obstructive pulmonary disease, unspecified: Secondary | ICD-10-CM | POA: Diagnosis not present

## 2020-01-28 DIAGNOSIS — Z7401 Bed confinement status: Secondary | ICD-10-CM | POA: Diagnosis not present

## 2020-01-28 DIAGNOSIS — E785 Hyperlipidemia, unspecified: Secondary | ICD-10-CM | POA: Diagnosis not present

## 2020-01-28 LAB — GLUCOSE, CAPILLARY
Glucose-Capillary: 78 mg/dL (ref 70–99)
Glucose-Capillary: 142 mg/dL — ABNORMAL HIGH (ref 70–99)
Glucose-Capillary: 178 mg/dL — ABNORMAL HIGH (ref 70–99)

## 2020-01-28 LAB — SARS CORONAVIRUS 2 BY RT PCR (HOSPITAL ORDER, PERFORMED IN ~~LOC~~ HOSPITAL LAB): SARS Coronavirus 2: NEGATIVE

## 2020-01-28 MED ORDER — LORATADINE 10 MG PO TABS: 10.0000 mg | ORAL_TABLET | Freq: Every day | ORAL | 0 refills | Status: DC

## 2020-01-28 MED ORDER — LORATADINE 10 MG PO TABS
10.0000 mg | ORAL_TABLET | Freq: Every day | ORAL | Status: DC
  Administered 2020-01-28: 10 mg via ORAL
  Filled 2020-01-28: qty 1

## 2020-01-28 MED ORDER — CEPHALEXIN 500 MG PO CAPS: 500.0000 mg | ORAL_CAPSULE | Freq: Three times a day (TID) | ORAL | 0 refills | Status: DC

## 2020-01-28 NOTE — Progress Notes (Signed)
Subjective:  Interviewed patient at bedside.   Patient reports severe itching. States that the lotion is not helping, team will try benadryl or other alternative. No other complaints at this time. Reports that she is breathing fine.   Currently just waiting on SNF placement and for a negative COVID test.    Objective:  Vital signs in last 24 hours: Vitals:   01/27/20 1601 01/27/20 2002 01/27/20 2124 01/28/20 0438  BP:   (!) 159/74 (!) 171/88  Pulse: 78  67 67  Resp: 18  18   Temp: 97.8 F (36.6 C)  98.7 F (37.1 C) 98.4 F (36.9 C)  TempSrc: Axillary  Oral Oral  SpO2:   98% 94%  Weight:  84.8 kg    Height:       Physical Exam Vitals and nursing note reviewed.  Constitutional:      General: She is not in acute distress.    Appearance: She is not ill-appearing or toxic-appearing.  Cardiovascular:     Rate and Rhythm: Normal rate and regular rhythm.     Pulses: Normal pulses.          Radial pulses are 2+ on the right side and 2+ on the left side.       Dorsalis pedis pulses are 2+ on the right side and 2+ on the left side.     Heart sounds: Normal heart sounds, S1 normal and S2 normal. No murmur heard.   Pulmonary:     Effort: Pulmonary effort is normal. No respiratory distress.     Breath sounds: Normal breath sounds. No wheezing.  Musculoskeletal:     Right lower leg: No edema.     Left lower leg: No edema.  Skin:    Findings: Rash (Poison ivy) present.     Comments: Itchy bilateral upper extremities and chest  Neurological:     Mental Status: She is alert. Mental status is at baseline.      Assessment/Plan:  Active Problems:   Syncope   AKI (acute kidney injury) ([place])   Julia Juhasz Spradlinis a 69 y.o.with PMH of T2DM, TIA, CKD3, recurrent falls, IDA, HTN, HLD, COPD, MDDadmit for syncope 2/2 orthostatic hypotension in setting of dehydration / hyperthermia.   Acute Kidney Injuryon CKD 3a likely2/2 Dehydration:Per history, poor oral intake w/  syncopethis admission.Trending towards baseline. -I/Os -Daily weights -Trend renal function -Avoid nephrotoxic meds when able   Syncope likely 2/2 orthostatic hypotension in setting of dehydration/hyperthermia: OT reports continued orthostatics yesterday. Will give an additional Liter of LR and then have orthostatics remeasured. -Consult to social work -Continue working with PT/OT -Encouraged increased oral intake -Hold home anti-hypertensives   Poison Ivy rash: Itching bilateral upper extremities and chest. Reports that lotion does not adequately control. Due to syncopy and balance issues already present will prescribe 2nd gen antihistamine. -Begin Claritin 10 mg daily for 2 weeks   Occipital scalp laceration/Hip pain: Sequelae of syncopal episode and fall. CT head and lumbar spine negative for intracerebral hemorrhage or fracture. ED repaired lac on admission. Management w/ supportive care -Scheduled tylenol -Dilaudid for breakthrough -Continue working with PT/OT   T2DM: Last hgb a1c 8.8. Uncontrolled but improving from prior. On canagliflozin-metformin, degludec at home -SSI -Glucose checks -Plan to stopcanagliflozin at discharge as likely contributing to dehydration   Depression: On amitriptyline and sertraline at home. Not having urinary retention. -Amitryptyline has been stopped -Continusertraline 100mg    DVT prophx:lovenox Diet:Carb-modified Code:Full   Prior to Admission Living Arrangement: Unstable housing  Anticipated Discharge Location: SNF Barriers to Discharge: None Dispo: Anticipated discharge today.   Briant Cedar, MD 01/28/2020, 5:57 AM Pager: [phone] After 5pm on weekdays and 1pm on weekends: On Call pager [phone]

## 2020-01-28 NOTE — Progress Notes (Signed)
   Cai Flott Zelek to be discharged [facility] [facility] care per MD order. Patient verbalized understanding.  Skin clean, dry and intact without evidence of skin break down, no evidence of skin tears noted. IV catheter discontinued intact. Site without signs and symptoms of complications. Dressing and pressure applied. Pt denies pain at the site currently. No complaints noted.  Patient free of lines, drains, and wounds.   Discharge packet assembled. An After Visit Summary (AVS) was printed and given to the EMS personnel. Gave report to the nurse Kristeen Miss and answered all her questions, waiting for pick up. Amaryllis Dyke, RN

## 2020-01-28 NOTE — Progress Notes (Signed)
Physical Therapy Treatment Patient Details Name: [name] MRN: [id number] DOB: 1950-12-13 Today's Date: 01/28/2020    History of Present Illness The pt is a 69 yo female presenting due to a LOC upon standing. Imaging reveals no injury to spine or L hip, but work up revealed AKI. PMH includes: frequent falls (3 times in past 2 weeks), CHF, COPD, HTN, DM II with neropathy, and TIAs.    PT Comments    Continuing work on functional mobility and activity tolerance;  Session focused on assessment of activity tolerance and mobility -- specifically with focus on orthostatics (see below); Showing improvements in movement quality and balance; still recommend RW or Rollator RW for stability with amb, and to give her the option to sit when needed    01/28/20 1100  Orthostatic Lying   BP- Lying 153/87  Pulse- Lying 80  Orthostatic Sitting  BP- Sitting 140/89  Pulse- Sitting 84  Orthostatic Standing at 0 minutes  BP- Standing at 0 minutes 124/78  Pulse- Standing at 0 minutes 94  Orthostatic Standing at 3 minutes  BP- Standing at 3 minutes 100/77 (symptomatic for dizziness)  Pulse- Standing at 3 minutes 96   RN aware of significant SBP drop from supine to standing at 3 minutes    Follow Up Recommendations  SNF     Equipment Recommendations  Rolling walker with 5" wheels    Recommendations for Other Services       Precautions / Restrictions Precautions Precautions: Fall Precaution Comments: pt reports multiple falls in last few weeks in addition to fall prior to admission Restrictions Other Position/Activity Restrictions: spine and L hip imaging negative for injury    Mobility  Bed Mobility Overal bed mobility: Needs Assistance Bed Mobility: Supine to Sit;Sit to Supine     Supine to sit: Supervision     General bed mobility comments: Supervision for safety  Transfers Overall transfer level: Needs assistance Equipment used: None Transfers: Sit to/from  Stand Sit to Stand: Min assist         General transfer comment: close min guard to min A to steady and control descent. cues to self-monitor for activity tolerance  Ambulation/Gait Ambulation/Gait assistance: Min assist Gait Distance (Feet): 20 Feet (10+10) Assistive device: 1 person hand held assist Gait Pattern/deviations: Step-to pattern;Decreased stride length;Shuffle;Trunk flexed     General Gait Details: Cues to self-monitor for activity tolerance; short distance amb (to sink and back), and then pt reported dizziness; Orthostatic BPs revealed a 36mmHg drop from supine to standign at 3 minutes   Stairs             Wheelchair Mobility    Modified Rankin (Stroke Patients Only)       Balance     Sitting balance-Leahy Scale: Fair       Standing balance-Leahy Scale: Fair                              Cognition Arousal/Alertness: Awake/alert Behavior During Therapy: Flat affect Overall Cognitive Status: Within Functional Limits for tasks assessed                                        Exercises      General Comments        Pertinent Vitals/Pain Pain Assessment: Faces Faces Pain Scale: Hurts a little bit Pain Location: head  and hips Pain Descriptors / Indicators: Grimacing;Sore Pain Intervention(s): Monitored during session    Home Living                      Prior Function            PT Goals (current goals can now be found in the care plan section) Acute Rehab PT Goals Patient Stated Goal: feel better PT Goal Formulation: With patient Time For Goal Achievement: 02/08/20 Potential to Achieve Goals: Good Progress towards PT goals: Progressing toward goals    Frequency    Min 2X/week      PT Plan Current plan remains appropriate;Frequency needs to be updated    Co-evaluation              AM-PAC PT "6 Clicks" Mobility   Outcome Measure  Help needed turning from your back to your side  while in a flat bed without using bedrails?: None Help needed moving from lying on your back to sitting on the side of a flat bed without using bedrails?: None Help needed moving to and from a bed to a chair (including a wheelchair)?: A Little Help needed standing up from a chair using your arms (e.g., wheelchair or bedside chair)?: A Little Help needed to walk in hospital room?: A Little Help needed climbing 3-5 steps with a railing? : A Lot 6 Click Score: 19    End of Session Equipment Utilized During Treatment: Gait belt Activity Tolerance: Patient tolerated treatment well Patient left: in bed;with call bell/phone within reach Nurse Communication: Mobility status (BP) PT Visit Diagnosis: Repeated falls (R29.6);Muscle weakness (generalized) (M62.81);Difficulty in walking, not elsewhere classified (R26.2);Pain Pain - Right/Left: Left Pain - part of body: Hip (and headache)     Time: 2035-5974 PT Time Calculation (min) (ACUTE ONLY): 27 min  Charges:  $Gait Training: 8-22 mins $Therapeutic Activity: 8-22 mins                     Roney Marion, PT  Acute Rehabilitation Services Pager [phone] Office [place] 01/28/2020, 2:47 PM

## 2020-01-28 NOTE — TOC Transition Note (Addendum)
Transition of Care [facility]) - CM/SW Discharge Note 01/28/20 - Discharged to [facility] Number for Report: [phone] Room 104   Patient Details  Name: [name] MRN: [id number] Date of Birth: [birth year]  Transition of Care [facility]) CM/SW Contact:  Sable Feil, LCSW Phone Number: 01/28/2020, 3:49 PM   Clinical Narrative: Patient medically stable discharge to [facility] today. Facility admissions director Juliann Pulse informed of patient's readiness for d/c and discharge clinicals transmitted to [facility]. Visited with patient at the bedside and informed her of discharge to SNF today. When asked, patient responded that she was not aware of being discharged today. Ms. Caton advised that non-emergency transport will be arranged once COVID test results.      Final next level of care: [place] ([place] [place] [facility]) Barriers to Discharge: Barriers Resolved   Patient Goals and CMS Choice Patient states their goals for this hospitalization and ongoing recovery are:: Patient in agreement with ST rehab CMS Medicare.gov Compare Post Acute Care list provided to:: Patient Choice offered to / list presented to : Patient  Discharge Placement - [place] number confirmed : 01/25/20            Patient to be transferred to facility by: Non-emergency ambulance Name of family member notified: Patient will contact her daughter Patient and family notified of of transfer: 01/28/20  Discharge Plan and Services                                   Social Determinants of Health (SDOH) Interventions  No SDOH interventions requested or needed at discharge   Readmission Risk Interventions Readmission Risk Prevention Plan 09/27/2018 09/26/2018  Transportation Screening - Complete  PCP or Specialist Appt within 5-7 Days Complete -  Home Care Screening - Complete  Medication Review (RN  CM) - Referral to Pharmacy  Some recent data might be hidden

## 2020-01-28 NOTE — Progress Notes (Signed)
Patient discharged to [facility], transported by [facility].

## 2020-01-28 NOTE — Consult Note (Signed)
   [facility] Inpatient Consult   01/28/2020  [name] 06-03-1951 [id number]   [facility] [employer] [ACO] Patient:  Medicare NextGen  Patient was screened for [place] Management services. Patient will have the transition of care call conducted by the primary care provider. This patient is also in an Warden/ranger which has a chronic disease management Embedded Care Management team. Currently, patient is  For a skilled nursing facility transition  Plan: Notification to be sent to the [place] Management and made aware of above needs. Also, will alert [facility] PAC RN of admission to [facility] for ST rehab needs.  Please contact for further questions,  Natividad Brood, RN BSN [facility] Liaison  [phone] business mobile phone Toll free office [phone]  Fax number: [phone] [email or web address]

## 2020-01-29 ENCOUNTER — Ambulatory Visit: Payer: Self-pay | Admitting: *Deleted

## 2020-01-29 ENCOUNTER — Telehealth: Payer: Medicare Other

## 2020-01-29 DIAGNOSIS — I10 Essential (primary) hypertension: Secondary | ICD-10-CM

## 2020-01-29 DIAGNOSIS — E118 Type 2 diabetes mellitus with unspecified complications: Secondary | ICD-10-CM

## 2020-01-29 LAB — GLUCOSE, CAPILLARY: Glucose-Capillary: 175 mg/dL — ABNORMAL HIGH (ref 70–99)

## 2020-01-29 NOTE — Chronic Care Management (AMB) (Signed)
  Chronic Care Management   Note  01/29/2020 Name: [place] Roehrich MRN: [id number] DOB: 07-Dec-1950  Patient was scheduled for initial chronic care assessment by this CCM RN however she was hospitalized at [facility] 8/14-8/17 for acute kidney injury and transferred to [facility] (SNF) for short term rehabilitation.    Follow up plan: The care management team will reach out to the patient once she has discharged from the skilled nursing facility.   Kelli Churn RN, CCM, [facility] RN Care Manager [phone]

## 2020-01-30 NOTE — Progress Notes (Signed)
Internal Medicine Clinic Resident  I have personally reviewed this encounter including the documentation in this note and/or discussed this patient with the care management provider. I will address any urgent items identified by the care management provider and will communicate my actions to the patient's PCP. I have reviewed the patient's CCM visit with my supervising attending, Dr Guilloud.  Shadee Rathod Y Damaso Laday, MD 01/30/2020    

## 2020-01-31 ENCOUNTER — Other Ambulatory Visit: Payer: Self-pay | Admitting: *Deleted

## 2020-01-31 NOTE — Progress Notes (Signed)
Internal Medicine Clinic Attending  CCM services provided by the care management provider and their documentation were discussed with Dr. Chen. We reviewed the pertinent findings, urgent action items addressed by the resident and non-urgent items to be addressed by the PCP.  I agree with the assessment, diagnosis, and plan of care documented in the CCM and resident's note.  Shanta Dorvil, MD 01/31/2020 

## 2020-01-31 NOTE — Progress Notes (Signed)
Internal Medicine Clinic Attending  CCM services provided by the care management provider and their documentation were discussed with Dr. Chen. We reviewed the pertinent findings, urgent action items addressed by the resident and non-urgent items to be addressed by the PCP.  I agree with the assessment, diagnosis, and plan of care documented in the CCM and resident's note.  Howell Groesbeck, MD 01/31/2020 

## 2020-01-31 NOTE — Patient Outreach (Signed)
Member screened for potential [facility] Care Management needs as a benefit of [place] Medicare.  [name] is receiving skilled therapy at [facility] SNF.   [name]'s PCP is with [place]. She is followed by [facility] Chronic Care Management team with [facility] Internal Medicine.   Collaboration with [facility] SNF social worker. Made SNF SW aware (prior to admission) [facility] ECM BSW made referral to [employer] for housing assistance. Request made that facility SW follow up with [facility] prior to SNF discharge.  SNF SW reports they offered assistance with finding member ALF placement but [name] declined.   Will continue to follow while member resides in SNF. Will update [facility] ECM team.   Julia Rolling, MSN-Ed, [name],[name] [place] Acute Care Coordinator [phone] [facility]) [phone]  (Toll free office)

## 2020-02-01 ENCOUNTER — Telehealth: Payer: Self-pay | Admitting: Internal Medicine

## 2020-02-01 DIAGNOSIS — E118 Type 2 diabetes mellitus with unspecified complications: Secondary | ICD-10-CM

## 2020-02-01 NOTE — Telephone Encounter (Addendum)
   Reason for call:   I received a call from [name] at 9 AM indicating she left AMA from the SNF.   Pertinent Data:   She mentions having multiple complaints regarding the SNF that she was discharged too. She mentions that she was unhappy about the delay in getting medication delivered to her room as well as finding ants in her room. She mentions that she also felt she had inadequate physical therapy at the nursing facility. As she had left AMA, she had not received any of her medications at discharge.    Assessment / Plan / Recommendations:   Advised that she does not need further antibiotic therapy as she has received 5 day course. Reviewed her discharge med list with her and advised on stopping her canagliflozin and switching to metformin instead on top of her xultophy for her diabetes. Metformin script sent to home pharmacy  As always, pt is advised that if symptoms worsen or new symptoms arise, they should go to an urgent care facility or to to ER for further evaluation.   Mosetta Anis, MD   02/01/2020, 3:49 PM

## 2020-02-03 MED ORDER — METFORMIN HCL ER 500 MG PO TB24: 500.0000 mg | ORAL_TABLET | Freq: Every day | ORAL | 0 refills | Status: DC

## 2020-02-04 ENCOUNTER — Other Ambulatory Visit: Payer: Self-pay | Admitting: *Deleted

## 2020-02-04 NOTE — Patient Outreach (Signed)
[facility] Post- Acute Care Coordinator follow up.   Update received from [place] social worker reporting Mrs. Slatten left AMA on this past Friday, 01/31/20.  Update sent to [place] team.    Marthenia Rolling, MSN-Ed, RN,BSN [place] Acute Care Coordinator [phone] [facility]) [phone]  (Toll free office)

## 2020-02-06 ENCOUNTER — Ambulatory Visit (INDEPENDENT_AMBULATORY_CARE_PROVIDER_SITE_OTHER): Payer: Medicare Other | Admitting: Student

## 2020-02-06 ENCOUNTER — Telehealth: Payer: Self-pay | Admitting: Student

## 2020-02-06 ENCOUNTER — Other Ambulatory Visit: Payer: Self-pay

## 2020-02-06 DIAGNOSIS — W19XXXD Unspecified fall, subsequent encounter: Secondary | ICD-10-CM | POA: Diagnosis not present

## 2020-02-06 DIAGNOSIS — R55 Syncope and collapse: Secondary | ICD-10-CM

## 2020-02-06 DIAGNOSIS — G8929 Other chronic pain: Secondary | ICD-10-CM

## 2020-02-06 DIAGNOSIS — Z96652 Presence of left artificial knee joint: Secondary | ICD-10-CM

## 2020-02-06 DIAGNOSIS — M25562 Pain in left knee: Secondary | ICD-10-CM

## 2020-02-06 NOTE — Telephone Encounter (Signed)
Notes by Colletta [place], PT8/17/2021 Equipment Recommendations Rolling walker with 5" wheels   Returned call to patient. Granddaughter has patient's phone currently. She will notify patient to stay by phone this afternoon as a Provider will call her to discuss need for rollator. Please call [phone]. Hubbard Hartshorn, BSN, RN-BC

## 2020-02-06 NOTE — Patient Instructions (Signed)
Thank you for allowing [place] to be a part of your care today, it was a pleasure seeing you. We discussed your frequent falls and need for a rolling walker.  I have ordered a rolling walker to your current address and documented your requirements for it.   Thank you, and please call the Internal Medicine Clinic at [phone] if you have any questions.  Best, Dr. Bridgett Larsson

## 2020-02-06 NOTE — Telephone Encounter (Signed)
Pt is requesting a call back about getting a Rollator walker.

## 2020-02-06 NOTE — Progress Notes (Signed)
°  [facility] Internal Medicine Residency Telephone Encounter Continuity Care Appointment  HPI:   This telephone encounter was created for [name] on 02/06/2020 for the following purpose/cc frequent falls, requires rolling walker.   Spoke on the phone with [name] concerning need for a rolling walker. She reports long-standing difficulty walking and history of frequent falls, worse since recent discharge from the hospital. She is unable to get around her residency to do laundry, get food, get other essential items, or go outside for recreation. She will be able to use a rolling walker independently, and will be able to accomplish the above activities with the assistance of one. Per PT evaluation from 01/28/20, she would benefit from a walker with 5" wheels.   She currently resides at: [place], [place] [address]   Past Medical History:  Past Medical History:  Diagnosis Date   Acute blood loss anemia 09/27/2018   Allergy    Anemia    Arthritis    "left knee" (09/08/2015)   Asthma    Blood transfusion without reported diagnosis 10/2017   anemic   Brachial plexus disorders    CHF (congestive heart failure) (HCC)    Chronic pain    neck pain, headache, neuropathy   COPD (chronic obstructive pulmonary disease) ([place])    SEES ONLY DR. PATEL -    Depression    "when my husband passed in 2013"   Facial trauma 05/2018   Facial trauma, initial encounter 06/18/2018   Fall 04/19/2018   Gastric ulcer    GERD (gastroesophageal reflux disease)    Hemorrhagic gastritis    History of kidney stones    Hypertension    Hypertriglyceridemia    IDA (iron deficiency anemia)    Internal hemorrhoids    Intracranial aneurysm    Migraine    "monthly" (09/08/2015)   Neuromuscular disorder (HCC)    neuropathy   Puncture wound of foot, right 05/17/2012   Tetanus shot 3 yrs ago at [facility] in [place], per pt report    Stroke ([place])     [place]   4 YRS AGO    Type II diabetes mellitus ([place]) 2010   diagnosed around 2010, only ever on metformin      ROS:  Review of Systems  Musculoskeletal: Positive for falls.       Assessment / Plan / Recommendations:   Please see A&P under problem oriented charting for assessment of the patient's acute and chronic medical conditions.   As always, pt is advised that if symptoms worsen or new symptoms arise, they should go to an urgent care facility or to to ER for further evaluation.   Consent and Medical Decision Making:   Patient seen with Dr. Angelia Mould  This is a telephone encounter between [name] and Andrew Au on 02/06/2020 for frequent falls, rolling walker requirement. The visit was conducted with the patient located at home and Andrew Au at [facility]. The patient's identity was confirmed using their DOB and current address. The patient has consented to being evaluated through a telephone encounter and understands the associated risks (an examination cannot be done and the patient may need to come in for an appointment) / benefits (allows the patient to remain at home, decreasing exposure to coronavirus). I personally spent 5 minutes on medical discussion.

## 2020-02-11 ENCOUNTER — Telehealth: Payer: Self-pay | Admitting: Internal Medicine

## 2020-02-11 NOTE — Telephone Encounter (Signed)
Pt calling back about her Rollator Walker.

## 2020-02-11 NOTE — Telephone Encounter (Signed)
    Reason for call:   I received a call from [name] at 6 PM indicating she had another fall this morning.   Pertinent Data:  She mentions that she continues to have falls because her knees give away. She has another fall this morning. She lives in the hotel. This morning and when she tried to ambulate, her knee gave a way and she fell.    Assessment / Plan / Recommendations:  Chronic mechanical falls: This has been an ongoing problem and she was evaluated in [facility] few times. Adjusted centraly acting medication and walker ordered for more gait stability. She mentions that she has not received that yet. She denies any head trauma or injury after the fall today. No headache, no N/V, no hip or limb pain and she does not think she has broken any bone after the fall this morning. EMS was called but per patient, they did not think she needs to come to the hospital. Patient does not have any complaint now and just would like to let us know about this event. No concerning symptoms for urgent evaluation in ED tonight but I recommended her to continue follow up in our clinic and be cautious when ambulating and until she receives the walker. She agrees with the plan and appreciates the call.  As always, pt is advised that if symptoms worsen or new symptoms arise, they should go to an urgent care facility or to to ER for further evaluation.   Dewayne Hatch, MD   02/11/2020, 8:49 PM

## 2020-02-11 NOTE — Telephone Encounter (Signed)
Patient notified that [employer] has the order for the rollator. Patient states she fallen twice since her last OV. CM sent to [facility] to explain patient needs the rollator as soon as possible and they do not have transportation to pick it up from retail store. Hubbard Hartshorn, BSN, RN-BC

## 2020-02-11 NOTE — Telephone Encounter (Signed)
Theophilus Bones, RN; Sandi Raveling, Jamas Lav; [place], Leory Plowman; 1 other   Hello Lauren,   The order has been processed and someone will give her a call shortly to schedule the delivery.   Thanks!

## 2020-02-11 NOTE — Telephone Encounter (Signed)
CM sent to Skeet Latch at [facility] for rollator. F2F was 02/06/2020. Informed her patient is staying at the address that is listed on the order. Hubbard Hartshorn, BSN, RN-BC

## 2020-02-11 NOTE — Telephone Encounter (Signed)
[name], [name]  Julia Asencio L, RN; [name], [name]; Stenson, Melissa; Gilliam, Pamela; [place], Bradley; 1 other ° ° °received, thanks  ° °

## 2020-02-12 ENCOUNTER — Telehealth: Payer: Self-pay | Admitting: Student

## 2020-02-12 NOTE — Telephone Encounter (Signed)
rtc to pt, lm for rtc 

## 2020-02-12 NOTE — Telephone Encounter (Signed)
Pls contact pt regarding fall [phone]

## 2020-02-12 NOTE — Progress Notes (Signed)
Internal Medicine Clinic Attending  Case discussed with Dr. Bridgett Larsson  At the time of the visit.  We attempted a call back together so that I may be able to answer any additonal questions but there was no answer.  We reviewed the resident's history and exam and pertinent patient test results.  I agree with the assessment, diagnosis, and plan of care documented in the resident's note.

## 2020-02-13 ENCOUNTER — Telehealth: Payer: Medicare Other

## 2020-02-13 ENCOUNTER — Ambulatory Visit (INDEPENDENT_AMBULATORY_CARE_PROVIDER_SITE_OTHER): Payer: Medicare Other | Admitting: *Deleted

## 2020-02-13 ENCOUNTER — Telehealth: Payer: Self-pay

## 2020-02-13 DIAGNOSIS — R55 Syncope and collapse: Secondary | ICD-10-CM

## 2020-02-13 LAB — CUP PACEART REMOTE DEVICE CHECK
Date Time Interrogation Session: 20210901230512
Implantable Pulse Generator Implant Date: 20201210

## 2020-02-13 NOTE — Telephone Encounter (Signed)
  Chronic Care Management   Outreach Note  02/13/2020 Name: Judeth Gilles MRN: [id number] DOB: Apr 24, 1951  Referred by: [name], MD Reason for referral : Care Coordination (Housing)   Patient contacted today for scheduled follow up phone call; went straight to voicemail.    Follow Up Plan: A HIPPA compliant phone message was left.  Informed patient that Care Guide will contact her for purpose of rescheduling appointment if she is unable to return call within the next thirty minutes.       [name], [place] Coordination Social Worker [place] [phone]

## 2020-02-14 ENCOUNTER — Telehealth: Payer: Self-pay | Admitting: *Deleted

## 2020-02-14 NOTE — Telephone Encounter (Signed)
I spoke to patient to let her no that the company will call her. The order was placed on 02-11-2020 and may be after the holiday Labor day but they will reach out to her. Patient also left her granddaughter number for them to call also along with change of address [name] C9/3/20214:04 PM

## 2020-02-14 NOTE — Telephone Encounter (Signed)
Return call to pt - states when she has to walk long distances, like from the [place] tower to our office,her legs collapse. She's currently living in a hotel and walking with a chair b/c she does not have a walker. States she suppose to be getting a walker but has not received one yet. Lauren is not here today - I asked Mamie to f/u. There's an order per Dr Bridgett Larsson.

## 2020-02-14 NOTE — Telephone Encounter (Signed)
Ok. Is there anything that I can do?

## 2020-02-14 NOTE — Telephone Encounter (Signed)
See todays telephone encounter

## 2020-02-14 NOTE — Telephone Encounter (Signed)
States called two days ago and still no call. Please call patient has some concerns.

## 2020-02-18 ENCOUNTER — Telehealth: Payer: Self-pay | Admitting: *Deleted

## 2020-02-18 NOTE — Telephone Encounter (Signed)
Please see phone encounter from 02/06/2020. [employer] was to have contacted patient on 02/11/2020. Hubbard Hartshorn, BSN, RN-BC

## 2020-02-18 NOTE — Chronic Care Management (AMB) (Signed)
  Care Management   Note  02/18/2020 Name: [name] MRN: [id number] DOB: [birth year]  [name] is a 69 y.o. year old female who is a primary care patient of [name], MD and is actively engaged with the care management team. I reached out to [place] by phone today to assist with re-scheduling a follow up visit with the BSW.  Follow up plan: Unsuccessful telephone outreach attempt made. A HIPPA compliant phone message was left for the patient providing contact information and requesting a return call. The care management team will reach out to the patient again over the next 7 days. If patient returns call to provider office, please advise to call [place] at [phone].  [place] Management

## 2020-02-18 NOTE — Progress Notes (Signed)
Carelink Summary Report / Loop Recorder 

## 2020-02-20 ENCOUNTER — Ambulatory Visit: Payer: Medicare Other | Admitting: *Deleted

## 2020-02-20 ENCOUNTER — Other Ambulatory Visit: Payer: Self-pay

## 2020-02-20 ENCOUNTER — Telehealth: Payer: Self-pay | Admitting: *Deleted

## 2020-02-20 ENCOUNTER — Telehealth: Payer: Medicare Other

## 2020-02-20 ENCOUNTER — Encounter: Payer: Self-pay | Admitting: Student

## 2020-02-20 ENCOUNTER — Ambulatory Visit: Payer: Medicare Other | Admitting: Neurology

## 2020-02-20 ENCOUNTER — Ambulatory Visit: Payer: Medicare Other

## 2020-02-20 ENCOUNTER — Ambulatory Visit (INDEPENDENT_AMBULATORY_CARE_PROVIDER_SITE_OTHER): Payer: Medicare Other | Admitting: Student

## 2020-02-20 VITALS — BP 116/76 | HR 105 | Wt 171.4 lb

## 2020-02-20 DIAGNOSIS — W19XXXD Unspecified fall, subsequent encounter: Secondary | ICD-10-CM

## 2020-02-20 DIAGNOSIS — I10 Essential (primary) hypertension: Secondary | ICD-10-CM

## 2020-02-20 DIAGNOSIS — Z794 Long term (current) use of insulin: Secondary | ICD-10-CM

## 2020-02-20 DIAGNOSIS — Z9181 History of falling: Secondary | ICD-10-CM

## 2020-02-20 DIAGNOSIS — E118 Type 2 diabetes mellitus with unspecified complications: Secondary | ICD-10-CM | POA: Diagnosis not present

## 2020-02-20 DIAGNOSIS — M549 Dorsalgia, unspecified: Secondary | ICD-10-CM

## 2020-02-20 DIAGNOSIS — Z0000 Encounter for general adult medical examination without abnormal findings: Secondary | ICD-10-CM

## 2020-02-20 DIAGNOSIS — E785 Hyperlipidemia, unspecified: Secondary | ICD-10-CM

## 2020-02-20 DIAGNOSIS — E1169 Type 2 diabetes mellitus with other specified complication: Secondary | ICD-10-CM

## 2020-02-20 DIAGNOSIS — E1142 Type 2 diabetes mellitus with diabetic polyneuropathy: Secondary | ICD-10-CM

## 2020-02-20 NOTE — Telephone Encounter (Signed)
  Chronic Care Management   Outreach Note  02/20/2020 Name: [name] MRN: [id number] DOB: [birth year]  Referred by: [name], MD Reason for referral : Chronic Care Management (IDDM, HTN,HLD)   An unsuccessful telephone outreach was attempted today to complete initial chronic care management assessment. . The patient was referred to the case management team for assistance with care management and care coordination.   Follow Up Plan: A HIPPA compliant phone message was left for the patient providing contact information and requesting a return call.  The care management team will reach out to the patient again over the next 7-10 days.   Kelli Churn RN, CCM, [facility] RN Care Manager [phone]

## 2020-02-20 NOTE — Chronic Care Management (AMB) (Signed)
Care Management   Follow Up Note   02/20/2020 Name: Helana Macbride MRN: [id number] DOB: 1950/08/18  Referred by: [name], MD Reason for referral : Care Coordination (Need for rapid COVID 19 tes)   Charlesia Wyllow Seigler is a 69 y.o. year old female who is a primary care patient of [name], MD. The care management team was consulted for assistance with care management and care coordination needs.    Review of patient status, including review of consultants reports, relevant laboratory and other test results, and collaboration with appropriate care team members and the patient's provider was performed as part of comprehensive patient evaluation and provision of chronic care management services.    SDOH (Social Determinants of Health) assessments performed: No See Care Plan activities for detailed interventions related to [facility])     Advanced Directives: See Care Plan and Vynca application for related entries.   Goals Addressed              This Visit's Progress   .  "We have been accepted into an emergency shelter in [facility] but need a rapid COVID test" (pt-stated)        [place] (see longitudinal plan of care for additional care plan information)  Current Barriers:  . Patient states that she and her family members have been accepted to an emergency shelter pending negative COVID 19 test.   Clinical Social Work Clinical Goal(s):  Marland Kitchen Over the next 2 days, patient will work with SW to address concerns related to need for rapid COVID test  Interventions: . Inter-disciplinary care team collaboration (see longitudinal plan of care) . Contacted the following facilities regarding availability for rapid testing:    [facility]-out of rapid tests  [facility]-provide vaccinations but not testing  [facility]-no appointments available until 02/22/20  [facility]-no appointments available for rapid testing  [facility]  established patient . Attempted to follow up with patient's granddaughter after they left the clinic to provide other possible resources via [facility] site.  Left message requesting return call.   Patient Self Care Activities:  . Patient currently homeless which is significantly impacting ability to manage health conditions.  .  Initial goal documentation     .  "We have been evicted from our house and have nowhere to live" (pt-stated)        Current Barriers:  Marland Kitchen Knowledge Barriers related to resources available to address need for emergency housing.  Patient reports that she, granddaughter, granddaughter's fiance, and great grandson were all living in a rental house.  Patient reports that granddaughter has significant mental health challenges which resulted in police being called multiple times.  States that neighbors also called the city to complain about condition of yard.  These factors resulted in them being evicted.  They have stayed in a motel for the last two weeks but patient's monthly income has been exhausted.  Family is assisting patient today with moving remaining belongings from home as all belongings have to be out by the end of the day.  Will be officially homeless as of tomorrow morning.  Per patient, Granddaughter is unable to work due to mental and physical health challenges.  Her fiance has part time employment at [employer].  Patient does not qualify for food stamps but granddaughter and her fiance do receive them. Patient's monthly income is $1258.   . 01/23/20:  Patient reports that granddaughter is part of a "parenting group"  that paid for them to reside in a motel until tomorrow morning.  This same group has agreed to assist with first month of rent and deposit if they locate new rental property.  Patient states that she and granddaughter are actively searching rental properties.  . 02/20/20:  Patient states that she has been accepted to emergency shelter in [facility]  pending negative COVID test  Case Manager Clinical Goal(s):  Marland Kitchen Over the next 180 days, patient will work with BSW to address needs related to Housing barriers . Over the next 180 days, BSW will collaborate with RN Care Manager to address care management and care coordination needs  Interventions:  Marland Kitchen [place] regarding lack of response to referral to [employer]  Patient Self Care Activities:  . Self administers medications as prescribed .   Please see past updates related to this goal by clicking on the "Past Updates" button in the selected goal          Will follow up with patient tomorrow regarding status of COVID 19 testing and shelter placement.     [name], [place] Coordination Social Worker [place] [phone]

## 2020-02-20 NOTE — Patient Instructions (Signed)
Visit Information It was nice meeting you and your granddaughter today.  Goals Addressed              This Visit's Progress     Patient Stated     "The medication list on my after visit summary is not correct." (pt-stated)        CARE PLAN ENTRY (see longitudinal plan of care for additional care plan information)  Current Barriers:   Chronic Disease Management support and education needs related to IDDM, HTN, HLD   Nurse Case Manager Clinical Goal(s):   Over the next 30-90 days, patient will demonstrate improved health management independence as evidenced by ongoing good control of HTN and improved Hgb A1C and/or improved home monitored blood sugars   Interventions:   Inter-disciplinary care team collaboration (see longitudinal plan of care)  Reviewed medications with patient and corrected medication list  Discussed plans with patient for ongoing care management follow up and provided patient with direct contact information for care management team,    Patient Self Care Activities:   Patient verbalizes understanding of plan to work with CCM team to improve DM self management   Self administers medications as prescribed  Attends all scheduled provider appointments  Calls pharmacy for medication refills  Performs ADL's independently  Performs IADL's independently  Calls provider office for new concerns or questions  Unable to independently manage IDDM to meet target Hgb A1C  Initial goal documentation       Patient told the BSW " I have nowhere to live." (pt-stated)        [place] (see longitudinal plan of care for additional care plan information)   Current Barriers:   Chronic Disease Management support, education, and care coordination needs related to HTN, HLD, DMII, Depression, and Homelessness- met with patient and her granddaughter in the clinic with CCM BSW Amber Chrisom to assist with locating rapid Covid testing site as they have been  accepted into a [place] shelter for the next 2 nights but only if they have a documentation of a negative Covid test  Case Manager Clinical Goal(s):   Over the next 180 days, patient will work with BSW to address needs related to Housing barriers in patient with HTN, HLD, DMII, Depression, and Homelessness  Interventions:   Collaborated with BSW to initiate plan of care to address needs related to Housing barriers in patient with HTN, HLD, DMII, Depression, and Homelessness  Called [place]'s Office of Patient experience and advised patient and her granddaughter that appointments for Covid test (not rapid test) are available at [facility]. Provided them with phone number and told them appointments are required and that they can call until 7:00 pm tonight  Patient Self Care Activities:   Patient verbalizes understanding of plan to work with CCM team to assist with housing options  Self administers medications as prescribed  Attends all scheduled provider appointments  Calls pharmacy for medication refills  Performs ADL's independently  Performs IADL's independently  Calls provider office for new concerns or questions  Please see past updates related to this goal by clicking on the "Past Updates" button in the selected goal        Other     Blood Pressure < 140/90        BP Readings from Last 3 Encounters:  02/20/20 116/76  01/28/20 139/85  01/17/20 135/76   Meeting blood pressure goals      HEMOGLOBIN A1C < 7  Lab Results  Component Value Date   HGBA1C 8.8 (A) 12/11/2019   Not meeting Hgb A1C target      LDL CALC < 100        Lab Results  Component Value Date   CHOL 100 01/09/2020   HDL 39 (L) 01/09/2020   LDLCALC 31 01/09/2020   LDLDIRECT 178 (H) 01/27/2014   TRIG 185 (H) 01/09/2020   CHOLHDL 2.6 01/09/2020   Not meeting all lipid targets       The patient verbalized understanding of instructions provided today and declined a  print copy of patient instruction materials.   The care management team will reach out to the patient again over the next 30-60 days.   Kelli Churn RN, CCM, [facility] RN Care Manager [phone]

## 2020-02-20 NOTE — Patient Instructions (Signed)
Visit Information  Goals Addressed              This Visit's Progress     "We have been accepted into an emergency shelter in [facility] but need a rapid COVID test" (pt-stated)        CARE PLAN ENTRY (see longitudinal plan of care for additional care plan information)  Current Barriers:   Patient states that she and her family members have been accepted to an emergency shelter pending negative COVID 19 test.   Clinical Social Work Clinical Goal(s):   Over the next 2 days, patient will work with SW to address concerns related to need for rapid COVID test  Interventions:  Inter-disciplinary care team collaboration (see longitudinal plan of care)  Contacted the following facilities regarding availability for rapid testing:    [facility]-out of rapid tests  [facility]-provide vaccinations but not testing  [facility]-no appointments available until 02/22/20  [facility]-no appointments available for rapid testing  [facility]-must be established patient  Attempted to follow up with patient's granddaughter after they left the clinic to provide other possible resources via [employer] site.  Left message requesting return call.   Patient Self Care Activities:   Patient currently homeless which is significantly impacting ability to manage health conditions.    Initial goal documentation       "We have been evicted from our house and have nowhere to live" (pt-stated)        Current Barriers:   Knowledge Barriers related to resources available to address need for emergency housing.  Patient reports that she, granddaughter, granddaughter's fiance, and great grandson were all living in a rental house.  Patient reports that granddaughter has significant mental health challenges which resulted in police being called multiple times.  States that neighbors also called the city to complain about condition of yard.  These factors resulted in them  being evicted.  They have stayed in a motel for the last two weeks but patient's monthly income has been exhausted.  Family is assisting patient today with moving remaining belongings from home as all belongings have to be out by the end of the day.  Will be officially homeless as of tomorrow morning.  Per patient, Granddaughter is unable to work due to mental and physical health challenges.  Her fiance has part time employment at [employer].  Patient does not qualify for food stamps but granddaughter and her fiance do receive them. Patient's monthly income is $1258.    01/23/20:  Patient reports that granddaughter is part of a "parenting group" that paid for them to reside in a motel until tomorrow morning.  This same group has agreed to assist with first month of rent and deposit if they locate new rental property.  Patient states that she and granddaughter are actively searching rental properties.   02/20/20:  Patient states that she has been accepted to emergency shelter in [facility] pending negative COVID test  Case Manager Clinical Goal(s):   Over the next 180 days, patient will work with BSW to address needs related to Housing barriers  Over the next 180 days, BSW will collaborate with Consulting civil engineer to address care management and care coordination needs  Interventions:   [place] regarding lack of response to referral to [employer]  Patient Self Care Activities:   Self administers medications as prescribed    Please see past updates related  to this goal by clicking on the "Past Updates" button in the selected goal         Patient verbalizes understanding of instructions provided today.   Telephone follow up appointment with care management team member scheduled for:02/21/20    [name], [place] Coordination Social Worker [place] [phone]

## 2020-02-20 NOTE — Chronic Care Management (AMB) (Signed)
Chronic Care Management   Follow Up Note   02/20/2020 Name: [name] MRN: [id number] DOB: 04/08/1951  Referred by: Liang, Jessica, MD Reason for referral : Chronic Care Management (IDDM, HTN,HLD,  MDD)   [name] is a 69 y.o. year old female who is a primary care patient of Liang, Jessica, MD. The CCM team was consulted for assistance with chronic disease management and care coordination needs.    Review of patient status, including review of consultants reports, relevant laboratory and other test results, and collaboration with appropriate care team members and the patient's provider was performed as part of comprehensive patient evaluation and provision of chronic care management services.    SDOH (Social Determinants of Health) assessments performed: No See Care Plan activities for detailed interventions related to SDOH)     Outpatient Encounter Medications as of 02/20/2020  Medication Sig  . albuterol (PROVENTIL HFA;VENTOLIN HFA) 108 (90 Base) MCG/ACT inhaler Inhale 1-2 puffs into the lungs every 6 (six) hours as needed for wheezing or shortness of breath.  . Canagliflozin-metFORMIN HCl (INVOKAMET) 150-500 MG TABS Take 2 tablets by mouth daily.  . fluticasone (FLONASE) 50 MCG/ACT nasal spray Place 2 sprays into both nostrils daily.  . gabapentin (NEURONTIN) 400 MG capsule Take 1 capsule (400 mg total) by mouth 3 (three) times daily.  . Insulin Degludec-Liraglutide (XULTOPHY) 100-3.6 UNIT-MG/ML SOPN Inject 18 Units into the skin daily.  . Insulin Pen Needle (NOVOFINE PLUS) 32G X 4 MM MISC 1 Units by Does not apply route every morning.  . Lancets (ONETOUCH DELICA PLUS LANCET33G) MISC 1 each by Does not apply route 2 (two) times daily.  . lisinopril (ZESTRIL) 5 MG tablet Take 1 tablet (5 mg total) by mouth daily.  . pantoprazole (PROTONIX) 40 MG tablet TAKE 1 TABLET BY MOUTH TWICE DAILY (Patient taking differently: Take 40 mg by mouth 2 (two) times daily. )  .  rosuvastatin (CRESTOR) 40 MG tablet TAKE 1 TABLET(40 MG) BY MOUTH DAILY (Patient taking differently: Take 40 mg by mouth daily. )  . sertraline (ZOLOFT) 100 MG tablet TAKE 1 TABLET(100 MG) BY MOUTH DAILY (Patient taking differently: Take 100 mg by mouth daily. TAKE 1 TABLET(100 MG) BY MOUTH DAILY)  . glucose blood (ONETOUCH VERIO) test strip Use as instructed  . HYDROcodone-acetaminophen (NORCO/VICODIN) 5-325 MG tablet Take 1 tablet by mouth every 4 (four) hours as needed. (Patient not taking: Reported on 01/25/2020)  . [DISCONTINUED] amLODipine (NORVASC) 10 MG tablet TAKE 1 TABLET BY MOUTH EVERY DAY (Patient not taking: Reported on 01/25/2020)  . [DISCONTINUED] cephALEXin (KEFLEX) 500 MG capsule Take 1 capsule (500 mg total) by mouth every 8 (eight) hours. (Patient not taking: Reported on 02/20/2020)  . [DISCONTINUED] loratadine (CLARITIN) 10 MG tablet Take 1 tablet (10 mg total) by mouth daily. (Patient not taking: Reported on 02/20/2020)  . [DISCONTINUED] metFORMIN (GLUCOPHAGE-XR) 500 MG 24 hr tablet Take 1 tablet (500 mg total) by mouth daily with breakfast. (Patient not taking: Reported on 02/20/2020)   No facility-administered encounter medications on file as of 02/20/2020.     Objective:   Wt Readings from Last 3 Encounters:  02/20/20 171 lb 6.4 oz (77.7 kg)  01/27/20 187 lb (84.8 kg)  01/17/20 183 lb (83 kg)    Goals Addressed              This Visit's Progress     Patient Stated   .  "The medication list on my after visit summary is not correct." (pt-stated)          CARE PLAN ENTRY (see longitudinal plan of care for additional care plan information)  Current Barriers:  . Chronic Disease Management support and education needs related to IDDM, HTN, HLD   Nurse Case Manager Clinical Goal(s):  Marland Kitchen Over the next 30-90 days, patient will demonstrate improved health management independence as evidenced by ongoing good control of HTN and improved Hgb A1C and/or improved home monitored blood  sugars   Interventions:  . Inter-disciplinary care team collaboration (see longitudinal plan of care) . Reviewed medications with patient and corrected medication list . Discussed plans with patient for ongoing care management follow up and provided patient with direct contact information for care management team,    Patient Self Care Activities:  . Patient verbalizes understanding of plan to work with CCM team to improve DM self management  . Self administers medications as prescribed . Attends all scheduled provider appointments . Calls pharmacy for medication refills . Performs ADL's independently . Performs IADL's independently . Calls provider office for new concerns or questions . Unable to independently manage IDDM to meet target Hgb A1C  Initial goal documentation     .  Patient told the BSW " I have nowhere to live." (pt-stated)        [place] (see longitudinal plan of care for additional care plan information)   Current Barriers:  . Chronic Disease Management support, education, and care coordination needs related to HTN, HLD, DMII, Depression, and Homelessness- met with patient and her granddaughter in the clinic with CCM BSW Amber Chrisom to assist with locating rapid Covid testing site as they have been accepted into a [place] shelter for the next 2 nights but only if they have a documentation of a negative Covid test  Case Manager Clinical Goal(s):  Marland Kitchen Over the next 180 days, patient will work with BSW to address needs related to Housing barriers in patient with HTN, HLD, DMII, Depression, and Homelessness  Interventions:  . Collaborated with BSW to initiate plan of care to address needs related to Housing barriers in patient with HTN, HLD, DMII, Depression, and Homelessness . Called [place]'s Office of Patient Experience and advised patient and her granddaughter that appointments for Covid test (not rapid test) are available at [facility].  Provided them with phone number and told them appointments are required and that they can call until 7:00 pm tonight  Patient Self Care Activities:  . Patient verbalizes understanding of plan to work with CCM team to assist with housing options . Self administers medications as prescribed . Attends all scheduled provider appointments . Calls pharmacy for medication refills . Performs ADL's independently . Performs IADL's independently . Calls provider office for new concerns or questions  Please see past updates related to this goal by clicking on the "Past Updates" button in the selected goal        Other   .  Blood Pressure < 140/90        BP Readings from Last 3 Encounters:  02/20/20 116/76  01/28/20 139/85  01/17/20 135/76   Meeting blood pressure goals    .  HEMOGLOBIN A1C < 7        Lab Results  Component Value Date   HGBA1C 8.8 (A) 12/11/2019   Not meeting Hgb A1C target    .  LDL CALC < 100        Lab Results  Component Value Date   CHOL 100 01/09/2020   HDL 39 (L) 01/09/2020  LDLCALC 31 01/09/2020   LDLDIRECT 178 (H) 01/27/2014   TRIG 185 (H) 01/09/2020   CHOLHDL 2.6 01/09/2020   Not meeting all lipid targets        Plan:   The care management team will reach out to the patient again over the next 30-60 days.    Kelli Churn RN, CCM, [facility] RN Care Manager [phone]

## 2020-02-20 NOTE — Patient Instructions (Signed)
Thank you, [name] for allowing [place] to provide your care today. Today we discussed your frequent falls and back pain. Continue to take tylenol and occasionally ibuprofen or Advil for the pain (Taking too much can worsen your kidney function). Call the home health agency to get your rollator. We recommend getting your covid vaccine because of your multiple medical problems.  Please follow-up if symptoms get worse  Should you have any questions or concerns please call the internal medicine clinic at [phone].    Linwood Dibbles, MD, MPH [facility]  My Chart Access: [email or web address]   If you have not already done so, please get your COVID 19 vaccine  To schedule an appointment for a COVID vaccine choice any of the following: Go to [email or web address]   Go to [email or web address]                  Call [phone]                                     Call [phone] and select Option 2  Back Exercises These exercises help to make your trunk and back strong. They also help to keep the lower back flexible. Doing these exercises can help to prevent back pain or lessen existing pain.  If you have back pain, try to do these exercises 2-3 times each day or as told by your doctor.  As you get better, do the exercises once each day. Repeat the exercises more often as told by your doctor.  To stop back pain from coming back, do the exercises once each day, or as told by your doctor. Exercises Single knee to chest Do these steps 3-5 times in a row for each leg: 1. Lie on your back on a firm bed or the floor with your legs stretched out. 2. Bring one knee to your chest. 3. Grab your knee or thigh with both hands and hold them it in place. 4. Pull on your knee until you feel a gentle stretch in your lower back or buttocks. 5. Keep doing the stretch for 10-30 seconds. 6. Slowly let go of your leg and  straighten it. Pelvic tilt Do these steps 5-10 times in a row: 1. Lie on your back on a firm bed or the floor with your legs stretched out. 2. Bend your knees so they point up to the ceiling. Your feet should be flat on the floor. 3. Tighten your lower belly (abdomen) muscles to press your lower back against the floor. This will make your tailbone point up to the ceiling instead of pointing down to your feet or the floor. 4. Stay in this position for 5-10 seconds while you gently tighten your muscles and breathe evenly. Cat-cow Do these steps until your lower back bends more easily: 1. Get on your hands and knees on a firm surface. Keep your hands under your shoulders, and keep your knees under your hips. You may put padding under your knees. 2. Let your head hang down toward your chest. Tighten (contract) the muscles in your belly. Point your tailbone toward the floor so your lower back becomes rounded like the back of a cat. 3. Stay in this position for 5 seconds. 4. Slowly lift your head. Let the muscles of your belly relax. Point your tailbone up toward the ceiling so your back forms a sagging arch like the back of  a cow. 5. Stay in this position for 5 seconds.  Press-ups Do these steps 5-10 times in a row: 1. Lie on your belly (face-down) on the floor. 2. Place your hands near your head, about shoulder-width apart. 3. While you keep your back relaxed and keep your hips on the floor, slowly straighten your arms to raise the top half of your body and lift your shoulders. Do not use your back muscles. You may change where you place your hands in order to make yourself more comfortable. 4. Stay in this position for 5 seconds. 5. Slowly return to lying flat on the floor.  Bridges Do these steps 10 times in a row: 1. Lie on your back on a firm surface. 2. Bend your knees so they point up to the ceiling. Your feet should be flat on the floor. Your arms should be flat at your sides, next to  your body. 3. Tighten your butt muscles and lift your butt off the floor until your waist is almost as high as your knees. If you do not feel the muscles working in your butt and the back of your thighs, slide your feet 1-2 inches farther away from your butt. 4. Stay in this position for 3-5 seconds. 5. Slowly lower your butt to the floor, and let your butt muscles relax. If this exercise is too easy, try doing it with your arms crossed over your chest. Belly crunches Do these steps 5-10 times in a row: 1. Lie on your back on a firm bed or the floor with your legs stretched out. 2. Bend your knees so they point up to the ceiling. Your feet should be flat on the floor. 3. Cross your arms over your chest. 4. Tip your chin a little bit toward your chest but do not bend your neck. 5. Tighten your belly muscles and slowly raise your chest just enough to lift your shoulder blades a tiny bit off of the floor. Avoid raising your body higher than that, because it can put too much stress on your low back. 6. Slowly lower your chest and your head to the floor. Back lifts Do these steps 5-10 times in a row: 1. Lie on your belly (face-down) with your arms at your sides, and rest your forehead on the floor. 2. Tighten the muscles in your legs and your butt. 3. Slowly lift your chest off of the floor while you keep your hips on the floor. Keep the back of your head in line with the curve in your back. Look at the floor while you do this. 4. Stay in this position for 3-5 seconds. 5. Slowly lower your chest and your face to the floor. Contact a doctor if:  Your back pain gets a lot worse when you do an exercise.  Your back pain does not get better 2 hours after you exercise. If you have any of these problems, stop doing the exercises. Do not do them again unless your doctor says it is okay. Get help right away if:  You have sudden, very bad back pain. If this happens, stop doing the exercises. Do not do  them again unless your doctor says it is okay. This information is not intended to replace advice given to you by your health care provider. Make sure you discuss any questions you have with your health care provider. Document Revised: 02/22/2018 Document Reviewed: 02/22/2018 Elsevier Patient Education  2020 [employer].

## 2020-02-20 NOTE — Progress Notes (Signed)
   CC: Chronic back pain    HPI:  [name] is a 69 y.o. with past medical history HTN, type 2 diabetes, GERD, COPD, CHF, degenerative disc disease and multiple falls who is presenting to clinic for pain control and Rapid Covid test.  Patient states she has fallen multiple times since her last admission to the hospital from a traumatic fall. States she would have to hold onto something to brace herself when she lose her balance.  Has not hit her head.  Patient states she is supposed to get a walker which she has not received yet but plans to call the company who will deliver it. She was living in a hotel with her granddaughter and great grandson however she was recently kicked out of the hotel.  She has found a woman's shelter in [facility] but she needs a rapid Covid test before they will take them in. She also wants some pain medicine for her chronic back pain.  She has tried Tylenol without significant relief.   Please see problem based charting for evaluation, assessment and plan.  Past Medical History:  Diagnosis Date  . Acute blood loss anemia 09/27/2018  . Arthritis    "left knee" (09/08/2015)  . Asthma   . Blood transfusion without reported diagnosis 10/2017   anemic  . Brachial plexus disorders   . CHF (congestive heart failure) ([place])   . Chronic pain    neck pain, headache, neuropathy  . COPD (chronic obstructive pulmonary disease) ([place])    SEES ONLY DR. PATEL -   . Depression    "when my husband passed in 2013"  . Facial trauma 05/2018  . Facial trauma, initial encounter 06/18/2018  . Fall 04/19/2018  . Gastric ulcer   . GERD (gastroesophageal reflux disease)   . Hemorrhagic gastritis   . History of kidney stones   . Hypertension   . Hypertriglyceridemia   . IDA (iron deficiency anemia)   . Internal hemorrhoids   . Intracranial aneurysm   . Migraine    "monthly" (09/08/2015)  . Neuromuscular disorder (HCC)    neuropathy  . Puncture wound of foot, right  05/17/2012   Tetanus shot 3 yrs ago at [facility] in [place], per pt report   . Stroke ([place])    TIAS    IN [place]   4 YRS AGO   . Type II diabetes mellitus ([place]) 2010   diagnosed around 2010, only ever on metformin   Review of Systems:  All ROS negative otherwise as stated in the HPI  Physical Exam:  General: Pleasant woman sitting in chair. No acute distress. Well nourished, well developed. Appears stated age Head: Normocephalic, atraumatic w/o tenderness. One staple in the back of head. Neck: Supple without adenopathy. Thyroid gland midline without masees Cardiac: RRR. No murmurs, rubs or gallops. S1, S2. No lower extremities edema Respiratory: Lungs CTAB. No wheezing or crackles. No increased WOB Abdominal: Soft, symmetric and non tender. No distension. Normal bowel sounds Skin: Warm, dry and intact without rashes or lesions Extremities: Atraumatic. Full ROM. Pulse palpable. Age-related arthritis hand changes Neuro: A&O x 3. Moves all extremities. Normal sensation Psych: Appropriate mood and affect. Normal judgement  Vitals:   02/20/20 1350  BP: 116/76  Pulse: (!) 105  SpO2: 99%  Weight: 171 lb 6.4 oz (77.7 kg)    Assessment & Plan:   See Encounters Tab for problem based charting.  Patient discussed with Dr. Emelia Salisbury, MD, MPH

## 2020-02-21 ENCOUNTER — Ambulatory Visit: Payer: Medicare Other

## 2020-02-21 ENCOUNTER — Encounter: Payer: Self-pay | Admitting: Student

## 2020-02-21 DIAGNOSIS — E1169 Type 2 diabetes mellitus with other specified complication: Secondary | ICD-10-CM

## 2020-02-21 DIAGNOSIS — I10 Essential (primary) hypertension: Secondary | ICD-10-CM

## 2020-02-21 DIAGNOSIS — E118 Type 2 diabetes mellitus with unspecified complications: Secondary | ICD-10-CM

## 2020-02-21 NOTE — Patient Instructions (Signed)
Visit Information  Goals Addressed              This Visit's Progress   .  "We have been accepted into an emergency shelter in [facility] but need a rapid COVID test" (pt-stated)        [place] (see longitudinal plan of care for additional care plan information)  Current Barriers:  . Patient states that she and her family members have been accepted to an emergency shelter pending negative COVID 19 test.  . 02/21/20:  Patient contacted [employer] [employer] regarding tests that were taking place today but those tests are not rapid.  Per granddaughter, Ubaldo Glassing from [place] is assisting with location of testing site  Clinical Social Work Clinical Goal(s):  Marland Kitchen Over the next 2 days, patient will work with SW to address concerns related to need for rapid COVID test  Interventions: . Provided granddaughter/patient with link to [facility] site which has list or facilities that are conducting tests  Patient Self Care Activities:  . Patient currently homeless which is significantly impacting ability to manage health conditions.  .  Please see past updates related to this goal by clicking on the "Past Updates" button in the selected goal         Patient verbalizes understanding of instructions provided today.   The patient has been provided with contact information for the care management team and has been advised to call with any health related questions or concerns.      [name], [place] Coordination Social Worker [place] [phone]

## 2020-02-21 NOTE — Assessment & Plan Note (Signed)
BP is stable during this visit at 116/76. Denies any any dizziness or headaches. Continue current BP regimen.

## 2020-02-21 NOTE — Assessment & Plan Note (Signed)
A1C of 8.8. Continue Xultophy 18 units and Invokamet 150-500 mg 2 tab.   Plan:  --Continue current diabetic regimen. --Check A1C at next visit. --Diabetic foot exam at next visit

## 2020-02-21 NOTE — Chronic Care Management (AMB) (Signed)
  Care Management   Note  02/21/2020 Name: [name] MRN: [id number] DOB: Sep 08, 1950  [name] is a 69 y.o. year old female who is a primary care patient of [name], MD and is actively engaged with the care management team. I reached out to Raynelle Jan [name] by phone today to assist with re-scheduling an initial visit with the RN Case Manager Pharmacist  Follow up plan: Face to Face appointment with care management team member scheduled for:  02/20/2020  Regino Ramirez Management

## 2020-02-21 NOTE — Assessment & Plan Note (Addendum)
Reports falling about 6 times at the hotel after leaving the hospital. Her knee often gives out causing her to lose her balance. She often brace herself or hold on to something so she has not had any traumatic falls since discharge from the hospital. States her walker was supposed to be delivered by [employer] yesterday but she has not receive it. She plans to call the company. She has tried tylenol for her back pain with minimal relief. She is currently homeless and needs a rapid covid test to be accepted at a women's shelter.   Plan: --Continue Tylenol use with the addition of Ibuprofen or Advil occasionally for pain relieve.   --Provided patient with back exercises to help relieve back pain --Continue Gabapentin 400 mg TID --Continue CCM management

## 2020-02-21 NOTE — Chronic Care Management (AMB) (Signed)
°  Care Management   Follow Up Note   02/21/2020 Name: Nini Cavan MRN: [id number] DOB: 03/09/1951  Referred by: [name], MD Reason for referral : Care Coordination (housing, covid testing)   Anysia Choi is a 69 y.o. year old female who is a primary care patient of [name], MD. The care management team was consulted for assistance with care management and care coordination needs.    Review of patient status, including review of consultants reports, relevant laboratory and other test results, and collaboration with appropriate care team members and the patient's provider was performed as part of comprehensive patient evaluation and provision of chronic care management services.    SDOH (Social Determinants of Health) assessments performed: No See Care Plan activities for detailed interventions related to [facility])     Advanced Directives: See Care Plan and Vynca application for related entries.   Goals Addressed              This Visit's Progress     "We have been accepted into an emergency shelter in [facility] but need a rapid COVID test" (pt-stated)        [place] (see longitudinal plan of care for additional care plan information)  Current Barriers:   Patient states that she and her family members have been accepted to an emergency shelter pending negative COVID 19 test.   02/21/20:  Patient contacted [facility] [employer] regarding tests that were taking place today but those tests are not rapid.  Per granddaughter, Ubaldo Glassing from [place] is assisting with location of testing site  Clinical Social Work Clinical Goal(s):   Over the next 2 days, patient will work with SW to address concerns related to need for rapid COVID test  Interventions:  Provided granddaughter/patient with link to [employer] site which has list or facilities that are conducting tests  Patient Self Care Activities:   Patient currently homeless which  is significantly impacting ability to manage health conditions.    Please see past updates related to this goal by clicking on the "Past Updates" button in the selected goal          The patient has been provided with contact information for the care management team and has been advised to call with any health/community resource related questions or concerns.      [name], [place] Coordination Social Worker [place] [phone]

## 2020-02-24 ENCOUNTER — Telehealth: Payer: Self-pay

## 2020-02-24 ENCOUNTER — Telehealth: Payer: Medicare Other

## 2020-02-24 NOTE — Telephone Encounter (Signed)
    MA9/13/2021 1st Attempt  Name: [name]   MRN: [id number]   DOB: [birth year]   AGE: 69 y.o.   GENDER: female   PCP [name], MD.   02/24/20 Left message on voicemail for patient to return my call regarding transportation resources. Will call again 02/25/20.  Ashika Apuzzo, AAS Paralegal, [place] . Embedded Care Coordination [facility]  Care Management  [address], [place] [address] [email or web address]  [phone]   [email or web address]

## 2020-02-24 NOTE — Progress Notes (Signed)
Internal Medicine Clinic Attending  I saw and evaluated the patient.  I personally confirmed the key portions of the history and exam documented by Dr. Amponsah and I reviewed pertinent patient test results.  The assessment, diagnosis, and plan were formulated together and I agree with the documentation in the resident's note.  

## 2020-02-24 NOTE — Telephone Encounter (Signed)
Encounter opened in error     [employer], [place] Coordination Social Worker [place] [phone]

## 2020-02-24 NOTE — Telephone Encounter (Signed)
°  Chronic Care Management   Outreach Note  02/24/2020 Name: [name] MRN: [id number] DOB: Aug 11, 1950  Referred by: [name], MD Reason for referral : Care Coordination (Housing, COVID testing)     Attempted to reach patient via granddaughter's mobile number as patient recently reported that her phone is not working.  Granddaughter did answer but was at an appointment at time of call.  Stated that she/patient would call back later today.      [name], [place] Coordination Social Worker [place] [phone]

## 2020-02-25 NOTE — Telephone Encounter (Signed)
    MA9/14/2021 2nd Attempt  Name: [name]   MRN: [id number]   DOB: 1950-07-25   AGE: 69 y.o.   GENDER: female   PCP [name], MD.    02/25/20 Left message on grand-daughter's [name] voicemail [phone] for patient to return my call regarding transportation resources. Will call again 02/26/20.   Morine Kohlman, AAS Paralegal, [place] . Embedded Care Coordination [facility]  Care Management  [address], [place] [address] [email or web address]  [phone]   [email or web address]

## 2020-02-26 NOTE — Telephone Encounter (Signed)
° °   MA9/15/2021 3rd Attempt   Name: [name]    MRN: [id number]    DOB: 1950/07/29    AGE: 69 y.o.    GENDER: female    PCP [name], MD.   02/26/20 Spoke with patient she has transportation and shelter and does not need assistance right now.  Closing referral since assistance is not needed at this time.     Dalaysia Harms, AAS Paralegal, [place] Management  [address], [place] [address] [email or web address]   [phone]   [email or web address]

## 2020-02-27 ENCOUNTER — Ambulatory Visit: Payer: Medicare Other | Admitting: *Deleted

## 2020-02-27 ENCOUNTER — Ambulatory Visit: Payer: Medicare Other

## 2020-02-27 DIAGNOSIS — Z794 Long term (current) use of insulin: Secondary | ICD-10-CM

## 2020-02-27 DIAGNOSIS — E118 Type 2 diabetes mellitus with unspecified complications: Secondary | ICD-10-CM

## 2020-02-27 DIAGNOSIS — E1169 Type 2 diabetes mellitus with other specified complication: Secondary | ICD-10-CM

## 2020-02-27 DIAGNOSIS — I10 Essential (primary) hypertension: Secondary | ICD-10-CM

## 2020-02-27 NOTE — Chronic Care Management (AMB) (Signed)
Care Management   Follow Up Note   02/27/2020 Name: [name] MRN: [id number] DOB: 1950-07-07  Referred by: Julia Beard, MD Reason for referral : Care Coordination (Housing)   [name] is a 69 y.o. year old female who is a primary care patient of Julia Beard, MD. The care management team was consulted for assistance with care management and care coordination needs.    Review of patient status, including review of consultants reports, relevant laboratory and other test results, and collaboration with appropriate care team members and the patient's provider was performed as part of comprehensive patient evaluation and provision of chronic care management services.    SDOH (Social Determinants of Health) assessments performed: No See Care Plan activities for detailed interventions related to [name])     Advanced Directives: See Care Plan and Vynca application for related entries.   Goals Addressed              This Visit's Progress   .  "We have been evicted from our house and have nowhere to live" (pt-stated)        Current Barriers:  Marland Kitchen Knowledge Barriers related to resources available to address need for emergency housing.  Patient reports that she, granddaughter, granddaughter's fiance, and great grandson were all living in a rental house.  Patient reports that granddaughter has significant mental health challenges which resulted in police being called multiple times.  States that neighbors also called the city to complain about condition of yard.  These factors resulted in them being evicted.  They have stayed in a motel for the last two weeks but patient's monthly income has been exhausted.  Family is assisting patient today with moving remaining belongings from home as all belongings have to be out by the end of the day.  Will be officially homeless as of tomorrow morning.  Per patient, Granddaughter is unable to work due to mental and physical health  challenges.  Her fiance has part time employment at [employer].  Patient does not qualify for food stamps but granddaughter and her fiance do receive them. Patient's monthly income is $1258.   . 01/23/20:  Patient reports that granddaughter is part of a "parenting group" that paid for them to reside in a motel until tomorrow morning.  This same group has agreed to assist with first month of rent and deposit if they locate new rental property.  Patient states that she and granddaughter are actively searching rental properties.  . 02/20/20:  Patient states that she has been accepted to emergency shelter in [facility] pending negative COVID test . 02/27/20:  Patient states that she and family members are now residing at [facility].  States this is an affordable option for them until permanent housing can be located.   Case Manager Clinical Goal(s):  Marland Kitchen Over the next 180 days, patient will work with BSW to address needs related to Housing barriers . Over the next 180 days, BSW will collaborate with RN Care Manager to address care management and care coordination needs  Interventions:  . Received communication from [employer] at [employer] that she left message for patient requesting return call. Marland Kitchen Called patient today and provided her with contact information for Ms. Flowers and encouraged her to call back . Informed patient that, per Ms. Flowers, she may not be able to assist with immediate housing but can possibly assist with getting her added to wait lists.  . Encouraged patient/granddaughter to utilize [email or web address]  for housing search  . Messaged Ms. Flowers at [facility] to inform her that patient has been encouraged to return her call . Ensured that Ms. Flowers has correct contact number  Patient Self Care Activities:  . Self administers medications as prescribed .   Please see past updates related to this goal by clicking on the "Past Updates" button in the selected goal            The care management team will reach out to the patient again over the next 21 days.      [name], [place] Coordination Social Worker [place] [phone]

## 2020-02-27 NOTE — Patient Instructions (Signed)
Visit Information It was nice speaking with you today. Goals Addressed              This Visit's Progress     Patient Stated     "The aspect of my diabetes management that I struggle with the most is my eating habits." (pt-stated)        CARE PLAN ENTRY (see longitudinal plan of care for additional care plan information)  Objective:  Lab Results  Component Value Date   HGBA1C 8.8 (A) 12/11/2019    Lab Results  Component Value Date   CREATININE 1.26 (H) 01/27/2020   CREATININE 1.40 (H) 01/26/2020   CREATININE 2.11 (H) 01/25/2020     No results found for: EGFR  Current Barriers:   Knowledge Deficits related to basic Diabetes pathophysiology and self care/management  Difficulty obtaining or cannot afford medications  Does not have glucometer to monitor blood sugar- patient states her Verio glucometer and testing supplies are in storage and she cannot get to them in the foreseeable future, she also says she is out of most of her medications and will have difficulty securing transportation to get her medications, she denies financial  concerns related to paying for her medications, she reports good medication taking behavior when she is not out of her medications, she states she was diagnosed with Type 2 DM about 8 years ago and received 8 hours of formal DM education with her husband when he was diagnosed, she says her husband died from complications of DM in 1610. She reports she started on insulin about 3 months ago, she says she checked her blood sugar twice daily when she had access to her testing supplies, she says she has symptoms of hypoglycemia occasionally and has always been able to self treat, she says she likes to binge eat potato chips and popcorn, she says she only eats when she is hungry and was able  lost over 50 lbs when she moved from [place] to [place] in 2013 by simply following her hunger cues  Patient has history of frequent falls- patient states her  falls are due to "my legs just give out"- she says it is not related to being dehydrated or her blood pressure, that it is a wekness in her legs and she has see a neurologist in the past about it, she says her Rolator is supposed to be delivered by [employer] to her temporary residence this week  Case Manager Clinical Goal(s):  Over the next 30-60 days, patient will demonstrate improved adherence to prescribed treatment plan for diabetes self care/management as evidenced by:   daily monitoring and recording of CBG once she has testing supplies  adherence to ADA/ carb modified diet  adherence to prescribed medication regimen  Interventions:   Review of patient status, including review of consultants reports, relevant laboratory and other test results, and medications completed.- discussed 11/26/19 results of urine for protein and discussed strategies to promote kidney health  Provided education to patient about basic DM disease process  Reviewed result of most recent Hgb  A1C,  and reviewed target Hgb A1C goal, and correlation to estimated average glucose, provided information on: prevention, detection, and treatment of long-term complications, discussed the role of prolonged elevated glucose levels on body systems, discussed recommendations for day to day and long-term diabetes self-care, reviewed recommended daily foot checks, and yearly cholesterol, urine, and eye testing, and checklist for medical, dental, and emotional self-care,  Reviewed medications with patient, assessed medication taking behavior and discussed  importance of medication adherence  Identified  pharmacy near patient's temporary residence, [place],  that will deliver her medications  Messaged provider of need for medication refills and testing supplies to be faxed to [place] with request to deliver to patient's residence  Mailed patient [place] and starter kit to patient's temporary address  Provided  patient with written educational materials related to hypo and hyperglycemia and importance of correct treatment and meal planning suggestions, diabetic nephropathy and LDL cholesterol, also mailed patient [place] [facility] spiral bound calendar with self management education for HTN and DM  Reviewed scheduled/upcoming provider appointments including: remote pacemaker checks and appointment with podiatrist on 04/01/20  Advised patient, providing education and rationale, to check CBG at least twice and record, calling CCM RN and or/clinic  provider for findings outside established parameters.    Discussed plans with patient for ongoing care management follow up and provided patient with direct contact information for care management team  Patient Self Care Activities:   UNABLE to independently secure DM testing supplies and medications and mange DM to meet Hgb A1C target  Self administers oral medications as prescribed  Self administers insulin as prescribed  Self administers injectable DM medication Xultophy as prescribed  Attends all scheduled provider appointments  Checks blood sugars as prescribed and utilize hyper and hypoglycemia protocol as needed  Adheres to prescribed ADA/carb modified  Initial goal documentation       COMPLETED: "The medication list on my after visit summary is not correct." (pt-stated)        CARE PLAN ENTRY (see longitudinal plan of care for additional care plan information)  Current Barriers:   Chronic Disease Management support and education needs related to IDDM, HTN, HLD  - spoke with patient via cell phone  Nurse Case Manager Clinical Goal(s):   Over the next 30-90 days, patient will demonstrate improved health management independence as evidenced by ongoing good control of HTN and improved Hgb A1C and/or improved home monitored blood sugars   Interventions:   Inter-disciplinary care team collaboration (see longitudinal  plan of care)  Reviewed medications with patient and corrected medication list  Established pharmacy in close proximity to patient's temporary residence that will deliver medications  Messaged provider about need for refills on medications and advised on current pharmacy patient will use as they will deliver her medications to her temporary residency  Discussed plans with patient for ongoing care management follow up and provided patient with direct contact information for care management team,    Patient Self Care Activities:   Patient verbalizes understanding of plan to work with CCM team to improve DM self management   Self administers medications as prescribed  Attends all scheduled provider appointments  Calls pharmacy for medication refills  Performs ADL's independently  Performs IADL's independently  Calls provider office for new concerns or questions  Unable to independently manage IDDM to meet target Hgb A1C  Initial goal documentation and Please see past updates related to this goal by clicking on the "Past Updates" button in the selected goal        COMPLETED: Patient told the BSW " I have nowhere to live." (pt-stated)        [place] (see longitudinal plan of care for additional care plan information)   Current Barriers:   Chronic Disease Management support, education, and care coordination needs related to HTN, HLD, DMII, Depression, and Homelessness- spoke with patient via cell phone, she states they are  living at [facility] [address] and will stay there until they are able to find a permanent residence    Case Manager Clinical Goal(s):   Over the next 180 days, patient will work with BSW to address needs related to Housing barriers in patient with HTN, HLD, DMII, Depression, and Homelessness  Interventions:   Collaborated with BSW to initiate plan of care to address needs related to Housing barriers in patient with HTN, HLD,  DMII, Depression, and Homelessness  Determine status of patient's housing situation- will complete goal for this care plan since patient has found temporary housing and plans to stay there until permanent housing can be secured  Patient Self Care Activities:   Patient verbalizes understanding of plan to work with CCM team to assist with housing options  Self administers medications as prescribed  Attends all scheduled provider appointments  Calls pharmacy for medication refills  Performs ADL's independently  Performs IADL's independently  Calls provider office for new concerns or questions  Please see past updates related to this goal by clicking on the "Past Updates" button in the selected goal         The patient verbalized understanding of instructions provided today and declined a print copy of patient instruction materials.   The care management team will reach out to the patient again over the next 30-60 days.   Kelli Churn RN, CCM, [facility] RN Care Manager [phone]

## 2020-02-27 NOTE — Chronic Care Management (AMB) (Addendum)
Chronic Care Management   Follow Up Note   02/27/2020 Name: [name] MRN: [id number] DOB: 1950/06/15  Referred by: [name], MD Reason for referral : Chronic Care Management (IDDM, HTN,HLD,  MDD)   [name] is a 69 y.o. year old female who is a primary care patient of [name], MD. The CCM team was consulted for assistance with chronic disease management and care coordination needs.    Review of patient status, including review of consultants reports, relevant laboratory and other test results, and collaboration with appropriate care team members and the patient's provider was performed as part of comprehensive patient evaluation and provision of chronic care management services.    SDOH (Social Determinants of Health) assessments performed: No See Care Plan activities for detailed interventions related to [facility])     Outpatient Encounter Medications as of 02/27/2020  Medication Sig Note  . albuterol (PROVENTIL HFA;VENTOLIN HFA) 108 (90 Base) MCG/ACT inhaler Inhale 1-2 puffs into the lungs every 6 (six) hours as needed for wheezing or shortness of breath.   . Insulin Degludec-Liraglutide (XULTOPHY) 100-3.6 UNIT-MG/ML SOPN Inject 18 Units into the skin daily. 02/27/2020: Patient states she needs refill  . Insulin Pen Needle (NOVOFINE PLUS) 32G X 4 MM MISC 1 Units by Does not apply route every morning. 02/27/2020: Does not need refill at this time  . lisinopril (ZESTRIL) 5 MG tablet Take 1 tablet (5 mg total) by mouth daily. 02/27/2020: States she needs refills  . pantoprazole (PROTONIX) 40 MG tablet TAKE 1 TABLET BY MOUTH TWICE DAILY (Patient taking differently: Take 40 mg by mouth 2 (two) times daily. ) 02/27/2020: States she is needs refills  . rosuvastatin (CRESTOR) 40 MG tablet TAKE 1 TABLET(40 MG) BY MOUTH DAILY (Patient taking differently: Take 40 mg by mouth daily. )   . sertraline (ZOLOFT) 100 MG tablet TAKE 1 TABLET(100 MG) BY MOUTH DAILY (Patient taking  differently: Take 100 mg by mouth daily. TAKE 1 TABLET(100 MG) BY MOUTH DAILY)   . Canagliflozin-metFORMIN HCl (INVOKAMET) 150-500 MG TABS Take 2 tablets by mouth daily. (Patient not taking: Reported on 02/27/2020) 02/27/2020: States she is out of medication and needs refills  . fluticasone (FLONASE) 50 MCG/ACT nasal spray Place 2 sprays into both nostrils daily. (Patient not taking: Reported on 02/27/2020) 02/27/2020: States she is out of medication and needs refills  . gabapentin (NEURONTIN) 400 MG capsule Take 1 capsule (400 mg total) by mouth 3 (three) times daily. (Patient not taking: Reported on 02/27/2020) 02/27/2020: States she is out of medication and needs refills  . glucose blood (ONETOUCH VERIO) test strip Use as instructed (Patient not taking: Reported on 02/27/2020) 02/27/2020: States she is out of test strips and needs refills  . HYDROcodone-acetaminophen (NORCO/VICODIN) 5-325 MG tablet Take 1 tablet by mouth every 4 (four) hours as needed. (Patient not taking: Reported on 01/25/2020)   . Lancets (ONETOUCH DELICA PLUS [id number]) MISC 1 each by Does not apply route 2 (two) times daily. (Patient not taking: Reported on 02/27/2020) 02/27/2020: States she is out of lancets and needs refills   No facility-administered encounter medications on file as of 02/27/2020.     Objective:  Wt Readings from Last 3 Encounters:  02/20/20 171 lb 6.4 oz (77.7 kg)  01/27/20 187 lb (84.8 kg)  01/17/20 183 lb (83 kg)   BP Readings from Last 3 Encounters:  02/20/20 116/76  01/28/20 139/85  01/17/20 135/76    Goals Addressed  This Visit's Progress     Patient Stated   .  "The aspect of my diabetes management that I struggle with the most is my eating habits." (pt-stated)        CARE PLAN ENTRY (see longitudinal plan of care for additional care plan information)  Objective:  Lab Results  Component Value Date   HGBA1C 8.8 (A) 12/11/2019 .   Lab Results  Component Value Date    CREATININE 1.26 (H) 01/27/2020   CREATININE 1.40 (H) 01/26/2020   CREATININE 2.11 (H) 01/25/2020 .   Marland Kitchen No results found for: EGFR  Current Barriers:  Marland Kitchen Knowledge Deficits related to basic Diabetes pathophysiology and self care/management . Difficulty obtaining or cannot afford medications . Does not have glucometer to monitor blood sugar- patient states her Verio glucometer and testing supplies are in storage and she cannot get to them in the foreseeable future, she also says she is out of most of her medications and will have difficulty securing transportation to get her medications, she denies financial  concerns related to paying for her medications, she reports good medication taking behavior when she is not out of her medications, she states she was diagnosed with Type 2 DM about 8 years ago and received 8 hours of formal DM education with her husband when he was diagnosed, she says her husband died from complications of DM in 3094. She reports she started on insulin about 3 months ago, she says she checked her blood sugar twice daily when she had access to her testing supplies, she says she has symptoms of hypoglycemia occasionally and has always been able to self treat, she says she likes to binge eat potato chips and popcorn, she says she only eats when she is hungry and was able  lost over 50 lbs when she moved from [place] to [place] in 2013 by simply following her hunger cues . Patient has history of frequent falls- patient states her falls are due to "my legs just give out"- she says it is not related to being dehydrated or her blood pressure, that it is a weakness in her legs and she has see a neurologist in the past about it, she says her Rolator is supposed to be delivered by [employer] to her temporary residence this   Case Manager Clinical Goal(s):  Over the next 30-60 days, patient will demonstrate improved adherence to prescribed treatment plan for diabetes self care/management as  evidenced by:  . daily monitoring and recording of CBG once she has testing supplies . adherence to ADA/ carb modified diet . adherence to prescribed medication regimen  Interventions:  . Review of patient status, including review of consultants reports, relevant laboratory and other test results, and medications completed.- discussed 11/26/19 results of urine for protein and discussed strategies to promote kidney health . Provided education to patient about basic DM disease process . Reviewed result of most recent Hgb  A1C,  and reviewed target Hgb A1C goal, and correlation to estimated average glucose, provided information on: prevention, detection, and treatment of long-term complications, discussed the role of prolonged elevated glucose levels on body systems, discussed recommendations for day to day and long-term diabetes self-care, reviewed recommended daily foot checks, and yearly cholesterol, urine, and eye testing, and checklist for medical, dental, and emotional self-care  Reviewed medications with patient, assessed medication taking behavior and discussed importance of medication adherence  Identified  pharmacy near patient's temporary residence, [place],  that will deliver her medications  Messaged  provider of need for medication refills and testing supplies to be faxed to [place] with request to deliver to patient's residence  Mailed patient [place] and starter kit to patient's temporary address  Provided patient with written educational materials related to hypo and hyperglycemia and importance of correct treatment and meal planning suggestions, diabetic nephropathy and LDL cholesterol, also mailed patient [place] [facility] spiral bound calendar with self management education for HTN and DM  Reviewed scheduled/upcoming provider appointments including: remote pacemaker checks and appointment with podiatrist on 04/01/20  Advised patient,  providing education and rationale, to check CBG at least twice and record, calling CCM RN and or/clinic  provider for findings outside established parameters.    Discussed plans with patient for ongoing care management follow up and provided patient with direct contact information for care management team  Patient Self Care Activities:  . UNABLE to independently secure DM testing supplies and medications and mange DM to meet Hgb A1C target . Self administers oral medications as prescribed . Self administers insulin as prescribed . Self administers injectable DM medication Xultophy as prescribed . Attends all scheduled provider appointments . Checks blood sugars as prescribed and utilize hyper and hypoglycemia protocol as needed . Adheres to prescribed ADA/carb modified  Initial goal documentation     .  COMPLETED: "The medication list on my after visit summary is not correct." (pt-stated)        CARE PLAN ENTRY (see longitudinal plan of care for additional care plan information)  Current Barriers:  . Chronic Disease Management support and education needs related to IDDM, HTN, HLD  - spoke with patient via cell phone  Nurse Case Manager Clinical Goal(s):  Marland Kitchen Over the next 30-90 days, patient will demonstrate improved health management independence as evidenced by ongoing good control of HTN and improved Hgb A1C and/or improved home monitored blood sugars   Interventions:  . Inter-disciplinary care team collaboration (see longitudinal plan of care) . Reviewed medications with patient and corrected medication list . Established pharmacy in close proximity to patient's temporary residence that will deliver medications . Messaged provider about need for refills on medications and advised on current pharmacy patient will use as they will deliver her medications to her temporary residency . Discussed plans with patient for ongoing care management follow up and provided patient with direct  contact information for care management team,    Patient Self Care Activities:  . Patient verbalizes understanding of plan to work with CCM team to improve DM self management  . Self administers medications as prescribed . Attends all scheduled provider appointments . Calls pharmacy for medication refills . Performs ADL's independently . Performs IADL's independently . Calls provider office for new concerns or questions . Unable to independently manage IDDM to meet target Hgb A1C  Initial goal documentation and Please see past updates related to this goal by clicking on the "Past Updates" button in the selected goal      .  COMPLETED: Patient told the BSW " I have nowhere to live." (pt-stated)        [place] (see longitudinal plan of care for additional care plan information)   Current Barriers:  . Chronic Disease Management support, education, and care coordination needs related to HTN, HLD, DMII, Depression, and Homelessness- spoke with patient via cell phone, she states they are living at [address] and will stay there until they are able to find a permanent residence  .  Case Manager Clinical Goal(s):  Marland Kitchen Over the next 180 days, patient will work with BSW to address needs related to Housing barriers in patient with HTN, HLD, DMII, Depression, and Homelessness  Interventions:  . Collaborated with BSW to initiate plan of care to address needs related to Housing barriers in patient with HTN, HLD, DMII, Depression, and Homelessness . Determine status of patient's housing situation- will complete goal for this care plan since patient has found temporary housing and plans to stay there until permanent housing can be secured  Patient Self Care Activities:  . Patient verbalizes understanding of plan to work with CCM team to assist with housing options . Self administers medications as prescribed . Attends all scheduled provider  appointments . Calls pharmacy for medication refills . Performs ADL's independently . Performs IADL's independently . Calls provider office for new concerns or questions  Please see past updates related to this goal by clicking on the "Past Updates" button in the selected goal          Plan:   The care management team will reach out to the patient again over the next 30-60 days.    Kelli Churn RN, CCM, [facility] RN Care Manager [phone]

## 2020-02-27 NOTE — Patient Instructions (Signed)
Visit Information  Goals Addressed              This Visit's Progress   .  "We have been evicted from our house and have nowhere to live" (pt-stated)        Current Barriers:  Marland Kitchen Knowledge Barriers related to resources available to address need for emergency housing.  Patient reports that she, granddaughter, granddaughter's fiance, and great grandson were all living in a rental house.  Patient reports that granddaughter has significant mental health challenges which resulted in police being called multiple times.  States that neighbors also called the city to complain about condition of yard.  These factors resulted in them being evicted.  They have stayed in a motel for the last two weeks but patient's monthly income has been exhausted.  Family is assisting patient today with moving remaining belongings from home as all belongings have to be out by the end of the day.  Will be officially homeless as of tomorrow morning.  Per patient, Granddaughter is unable to work due to mental and physical health challenges.  Her fiance has part time employment at [employer].  Patient does not qualify for food stamps but granddaughter and her fiance do receive them. Patient's monthly income is $1258.   . 01/23/20:  Patient reports that granddaughter is part of a "parenting group" that paid for them to reside in a motel until tomorrow morning.  This same group has agreed to assist with first month of rent and deposit if they locate new rental property.  Patient states that she and granddaughter are actively searching rental properties.  . 02/20/20:  Patient states that she has been accepted to emergency shelter in [facility] pending negative COVID test . 02/27/20:  Patient states that she and family members are now residing at [facility].  States this is an affordable option for them until permanent housing can be located.   Case Manager Clinical Goal(s):  Marland Kitchen Over the next 180 days, patient will work with BSW to  address needs related to Housing barriers . Over the next 180 days, BSW will collaborate with RN Care Manager to address care management and care coordination needs  Interventions:  . Received communication from [employer] at [employer] that she left message for patient requesting return call. Marland Kitchen Called patient today and provided her with contact information for Ms. Curtain and encouraged her to call back . Informed patient that, per Ms. Curtain, she may not be able to assist with immediate housing but can possibly assist with getting her added to wait lists.  . Encouraged patient/granddaughter to utilize [email or web address] for housing search  . Messaged Ms. Curtain at [facility] to inform her that patient has been encouraged to return her call . Ensured that Ms. Curtain has correct contact number  Patient Self Care Activities:  . Self administers medications as prescribed .   Please see past updates related to this goal by clicking on the "Past Updates" button in the selected goal         Patient verbalizes understanding of instructions provided today.   The care management team will reach out to the patient again over the next 21 days.       [name], [place] Coordination Social Worker [place] [phone]

## 2020-02-28 ENCOUNTER — Ambulatory Visit: Payer: Medicare Other | Admitting: *Deleted

## 2020-02-28 ENCOUNTER — Other Ambulatory Visit: Payer: Self-pay | Admitting: Student

## 2020-02-28 DIAGNOSIS — I10 Essential (primary) hypertension: Secondary | ICD-10-CM

## 2020-02-28 DIAGNOSIS — K2950 Unspecified chronic gastritis without bleeding: Secondary | ICD-10-CM

## 2020-02-28 DIAGNOSIS — E1169 Type 2 diabetes mellitus with other specified complication: Secondary | ICD-10-CM

## 2020-02-28 DIAGNOSIS — E118 Type 2 diabetes mellitus with unspecified complications: Secondary | ICD-10-CM

## 2020-02-28 DIAGNOSIS — Z794 Long term (current) use of insulin: Secondary | ICD-10-CM

## 2020-02-28 DIAGNOSIS — E1142 Type 2 diabetes mellitus with diabetic polyneuropathy: Secondary | ICD-10-CM

## 2020-02-28 MED ORDER — LISINOPRIL 5 MG PO TABS: 5.0000 mg | ORAL_TABLET | Freq: Every day | ORAL | 0 refills | Status: AC

## 2020-02-28 MED ORDER — FLUTICASONE PROPIONATE 50 MCG/ACT NA SUSP: 2.0000 | Freq: Every day | NASAL | 2 refills | Status: DC

## 2020-02-28 MED ORDER — ONETOUCH DELICA PLUS LANCET33G MISC: 1.0000 | Freq: Two times a day (BID) | 6 refills | Status: AC

## 2020-02-28 MED ORDER — XULTOPHY 100-3.6 UNIT-MG/ML ~~LOC~~ SOPN: 18.0000 [IU] | PEN_INJECTOR | Freq: Every day | SUBCUTANEOUS | 2 refills | Status: AC

## 2020-02-28 MED ORDER — INVOKAMET 150-500 MG PO TABS: 2.0000 | ORAL_TABLET | Freq: Every day | ORAL | 3 refills | Status: DC

## 2020-02-28 MED ORDER — LISINOPRIL 5 MG PO TABS: 5.0000 mg | ORAL_TABLET | Freq: Every day | ORAL | 0 refills | Status: DC

## 2020-02-28 MED ORDER — PANTOPRAZOLE SODIUM 40 MG PO TBEC: 40.0000 mg | DELAYED_RELEASE_TABLET | Freq: Two times a day (BID) | ORAL | 0 refills | Status: AC

## 2020-02-28 MED ORDER — PANTOPRAZOLE SODIUM 40 MG PO TBEC: 40.0000 mg | DELAYED_RELEASE_TABLET | Freq: Two times a day (BID) | ORAL | 0 refills | Status: DC

## 2020-02-28 MED ORDER — INVOKAMET 150-500 MG PO TABS: 2.0000 | ORAL_TABLET | Freq: Every day | ORAL | 3 refills | Status: AC

## 2020-02-28 MED ORDER — ONETOUCH VERIO VI STRP: ORAL_STRIP | 12 refills | Status: AC

## 2020-02-28 MED ORDER — FLUTICASONE PROPIONATE 50 MCG/ACT NA SUSP: 2.0000 | Freq: Every day | NASAL | 2 refills | Status: AC

## 2020-02-28 MED ORDER — ONETOUCH DELICA PLUS LANCET33G MISC: 1.0000 | Freq: Two times a day (BID) | 6 refills | Status: DC

## 2020-02-28 MED ORDER — ONETOUCH VERIO VI STRP: ORAL_STRIP | 12 refills | Status: DC

## 2020-02-28 MED ORDER — GABAPENTIN 400 MG PO CAPS: 400.0000 mg | ORAL_CAPSULE | Freq: Three times a day (TID) | ORAL | 0 refills | Status: AC

## 2020-02-28 MED ORDER — XULTOPHY 100-3.6 UNIT-MG/ML ~~LOC~~ SOPN: 18.0000 [IU] | PEN_INJECTOR | Freq: Every day | SUBCUTANEOUS | 2 refills | Status: DC

## 2020-02-28 MED ORDER — GABAPENTIN 400 MG PO CAPS: 400.0000 mg | ORAL_CAPSULE | Freq: Three times a day (TID) | ORAL | 0 refills | Status: DC

## 2020-02-28 NOTE — Progress Notes (Signed)
Contacted phone number listed in patient's chart to confirm patient's address. Patient's granddaughter picked up the phone and confirmed that [name] is currently living at the address listed as a temporary address in her charge. Given her need for refills of her medications, her refilled prescriptions will be sent to this address. I have left a note for the pharmacy to deliver these medications.

## 2020-02-28 NOTE — Chronic Care Management (AMB) (Signed)
Chronic Care Management   Follow Up Note   02/28/2020 Name: [name] MRN: [id number] DOB: 04/23/1951  Referred by: Flowers, Jessica, MD Reason for referral : Chronic Care Management ( IDDM, HTN, HLD, Major Depressive Disorder)   [name] is a 69 y.o. year old female who is a primary care patient of Flowers, Jessica, MD. The CCM team was consulted for assistance with chronic disease management and care coordination needs.    Review of patient status, including review of consultants reports, relevant laboratory and other test results, and collaboration with appropriate care team members and the patient's provider was performed as part of comprehensive patient evaluation and provision of chronic care management services.    SDOH (Social Determinants of Health) assessments performed: No See Care Plan activities for detailed interventions related to SDOH)     Outpatient Encounter Medications as of 02/28/2020  Medication Sig Note  . albuterol (PROVENTIL HFA;VENTOLIN HFA) 108 (90 Base) MCG/ACT inhaler Inhale 1-2 puffs into the lungs every 6 (six) hours as needed for wheezing or shortness of breath.   . Canagliflozin-metFORMIN HCl (INVOKAMET) 150-500 MG TABS Take 2 tablets by mouth daily.   . fluticasone (FLONASE) 50 MCG/ACT nasal spray Place 2 sprays into both nostrils daily.   . gabapentin (NEURONTIN) 400 MG capsule Take 1 capsule (400 mg total) by mouth 3 (three) times daily.   . glucose blood (ONETOUCH VERIO) test strip Use as instructed   . HYDROcodone-acetaminophen (NORCO/VICODIN) 5-325 MG tablet Take 1 tablet by mouth every 4 (four) hours as needed. (Patient not taking: Reported on 01/25/2020)   . Insulin Degludec-Liraglutide (XULTOPHY) 100-3.6 UNIT-MG/ML SOPN Inject 18 Units into the skin daily.   . Insulin Pen Needle (NOVOFINE PLUS) 32G X 4 MM MISC 1 Units by Does not apply route every morning. 02/27/2020: Does not need refill at this time  . Lancets (ONETOUCH DELICA  PLUS LANCET33G) MISC 1 each by Does not apply route 2 (two) times daily.   . lisinopril (ZESTRIL) 5 MG tablet Take 1 tablet (5 mg total) by mouth daily.   . pantoprazole (PROTONIX) 40 MG tablet Take 1 tablet (40 mg total) by mouth 2 (two) times daily.   . rosuvastatin (CRESTOR) 40 MG tablet TAKE 1 TABLET(40 MG) BY MOUTH DAILY (Patient taking differently: Take 40 mg by mouth daily. )   . sertraline (ZOLOFT) 100 MG tablet TAKE 1 TABLET(100 MG) BY MOUTH DAILY (Patient taking differently: Take 100 mg by mouth daily. TAKE 1 TABLET(100 MG) BY MOUTH DAILY)    No facility-administered encounter medications on file as of 02/28/2020.     Objective:  Wt Readings from Last 3 Encounters:  02/20/20 171 lb 6.4 oz (77.7 kg)  01/27/20 187 lb (84.8 kg)  01/17/20 183 lb (83 kg)   BP Readings from Last 3 Encounters:  02/20/20 116/76  01/28/20 139/85  01/17/20 135/76   Lab Results  Component Value Date   CHOL 100 01/09/2020   HDL 39 (L) 01/09/2020   LDLCALC 31 01/09/2020   LDLDIRECT 178 (H) 01/27/2014   TRIG 185 (H) 01/09/2020   CHOLHDL 2.6 01/09/2020    Goals Addressed              This Visit's Progress     Patient Stated   .  "The aspect of my diabetes management that I struggle with the most is my eating habits." (pt-stated)        CARE PLAN ENTRY (see longitudinal plan of care for additional care plan   information)  Objective:  Lab Results  Component Value Date   HGBA1C 8.8 (A) 12/11/2019 .   Lab Results  Component Value Date   CREATININE 1.26 (H) 01/27/2020   CREATININE 1.40 (H) 01/26/2020   CREATININE 2.11 (H) 01/25/2020 .   Marland Kitchen No results found for: EGFR  Current Barriers:  Marland Kitchen Knowledge Deficits related to basic Diabetes pathophysiology and self care/management . Difficulty obtaining or cannot afford medications . Does not have glucometer to monitor blood sugar- patient states her Verio glucometer and testing supplies are in storage and she cannot get to them in the  foreseeable future, she also says she is out of most of her medications and will have difficulty securing transportation to get her medications, she denies financial  concerns related to paying for her medications, she reports good medication taking behavior when she is not out of her medications, she states she was diagnosed with Type 2 DM about 8 years ago and received 8 hours of formal DM education with her husband when he was diagnosed, she says her husband died from complications of DM in 6301. She reports she started on insulin about 3 months ago, she says she checked her blood sugar twice daily when she had access to her testing supplies, she says she has symptoms of hypoglycemia occasionally and has always been able to self treat, she says she likes to binge eat potato chips and popcorn, she says she only eats when she is hungry and was able  lost over 50 lbs when she moved from [place] to [place] in 2013 by simply following her hunger cues . Patient has history of frequent falls- patient states her falls are due to "my legs just give out"- she says it is not related to being dehydrated or her blood pressure, that it is a weakness in her legs and she has see a neurologist in the past about it, she says her Rolator is supposed to be delivered by [employer] to her temporary residence this week 02/28/20- spoke with patient to advise her medications will be delivered to her from [place] and this CCM RN will hand deliver [place]):  Over the next 30-60 days, patient will demonstrate improved adherence to prescribed treatment plan for diabetes self care/management as evidenced by:  . daily monitoring and recording of CBG once she has testing supplies . adherence to ADA/ carb modified diet . adherence to prescribed medication regimen  Interventions:  . Review of patient status, including review of consultants reports, relevant laboratory and other test results,  and medications completed.- discussed 11/26/19 results of urine for protein and discussed strategies to promote kidney health . Provided education to patient about basic DM disease process . Reviewed result of most recent Hgb  A1C,  and reviewed target Hgb A1C goal, and correlation to estimated average glucose, provided information on: prevention, detection, and treatment of long-term complications, discussed the role of prolonged elevated glucose levels on body systems, discussed recommendations for day to day and long-term diabetes self-care, reviewed recommended daily foot checks, and yearly cholesterol, urine, and eye testing, and checklist for medical, dental, and emotional self-care, . Reviewed medications with patient, assessed medication taking behavior and discussed importance of medication adherence . Identified  pharmacy near patient's temporary residence, [place],  that will deliver her medications- 02/28/20 [facility] and provided [facility] with [facility] . Messaged provider of need  for medication refills and testing supplies to [facility] [place] with request to deliver to patient's residence . Informed patient this CCM RN will hand deliver Verio Glucometer and starter kit to patient's temporary address on 02/29/20 . Provided patient with written educational materials related to hypo and hyperglycemia and importance of correct treatment and meal planning suggestions, diabetic nephropathy and high triglyceride and triglyceride level, also mailed patient [facility] spiral bound calendar with self management education for HTN and DM . Reviewed scheduled/upcoming provider appointments including: remote pacemaker checks and appointment with podiatrist on 04/01/20 . Advised patient, providing education and rationale, to check CBG at least twice and record, calling CCM RN and  or/clinic  provider for findings outside established parameters.   . Discussed plans with patient for ongoing care management follow up and provided patient with direct contact information for care management team  Patient Self Care Activities:  . UNABLE to independently secure DM testing supplies and medications and mange DM to meet Hgb A1C target . Self administers oral medications as prescribed . Self administers insulin as prescribed . Self administers injectable DM medication Xultophy as prescribed . Attends all scheduled provider appointments . Checks blood sugars as prescribed and utilize hyper and hypoglycemia protocol as needed . Adheres to prescribed ADA/carb modified  Please see past updates related to this goal by clicking on the "Past Updates" button in the selected goal          Plan:   The care management team will reach out to the patient again over the next 30-60 days.    Janet Hauser RN, CCM, [facility] CCM Clinic RN Care Manager [phone]  

## 2020-02-28 NOTE — Progress Notes (Signed)
Internal Medicine Clinic Resident  I have personally reviewed this encounter including the documentation in this note and/or discussed this patient with the care management provider. I will address any urgent items identified by the care management provider and will communicate my actions to the patient's PCP. I have reviewed the patient's CCM visit with my supervising attending, Dr Hoffman.  Matt Sammantha Mehlhaff, MD 02/28/2020   

## 2020-02-28 NOTE — Progress Notes (Signed)
Internal Medicine Clinic Resident  I have personally reviewed this encounter including the documentation in this note and/or discussed this patient with the care management provider. I will address any urgent items identified by the care management provider and will communicate my actions to the patient's PCP. I have reviewed the patient's CCM visit with my supervising attending, Dr Hoffman.  Matt Thais Silberstein, MD 02/28/2020   

## 2020-03-03 ENCOUNTER — Ambulatory Visit: Payer: Medicare Other | Admitting: *Deleted

## 2020-03-03 ENCOUNTER — Telehealth: Payer: Self-pay | Admitting: Student

## 2020-03-03 DIAGNOSIS — E1169 Type 2 diabetes mellitus with other specified complication: Secondary | ICD-10-CM

## 2020-03-03 DIAGNOSIS — E118 Type 2 diabetes mellitus with unspecified complications: Secondary | ICD-10-CM

## 2020-03-03 DIAGNOSIS — I10 Essential (primary) hypertension: Secondary | ICD-10-CM

## 2020-03-03 NOTE — Telephone Encounter (Signed)
Pt calling to report she is having a [facility].  Pt is requesting a call back as soon as possible.

## 2020-03-03 NOTE — Chronic Care Management (AMB) (Signed)
Chronic Care Management   Follow Up Note   03/03/2020 Name: [name] MRN: [id number] DOB: 1951/04/26  Referred by: [name], MD Reason for referral : Chronic Care Management (IDDM, HTN, HLD, Major Depressive Disorder)   [name] is a 69 y.o. year old female who is a primary care patient of [name], MD. The CCM team was consulted for assistance with chronic disease management and care coordination needs.    Review of patient status, including review of consultants reports, relevant laboratory and other test results, and collaboration with appropriate care team members and the patient's provider was performed as part of comprehensive patient evaluation and provision of chronic care management services.    SDOH (Social Determinants of Health) assessments performed: No See Care Plan activities for detailed interventions related to [facility])     Outpatient Encounter Medications as of 03/03/2020  Medication Sig Note  . albuterol (PROVENTIL HFA;VENTOLIN HFA) 108 (90 Base) MCG/ACT inhaler Inhale 1-2 puffs into the lungs every 6 (six) hours as needed for wheezing or shortness of breath.   . Canagliflozin-metFORMIN HCl (INVOKAMET) 150-500 MG TABS Take 2 tablets by mouth daily.   . fluticasone (FLONASE) 50 MCG/ACT nasal spray Place 2 sprays into both nostrils daily.   Marland Kitchen gabapentin (NEURONTIN) 400 MG capsule Take 1 capsule (400 mg total) by mouth 3 (three) times daily.   Marland Kitchen glucose blood (ONETOUCH VERIO) test strip Use as instructed   . HYDROcodone-acetaminophen (NORCO/VICODIN) 5-325 MG tablet Take 1 tablet by mouth every 4 (four) hours as needed. (Patient not taking: Reported on 01/25/2020)   . Insulin Degludec-Liraglutide (XULTOPHY) 100-3.6 UNIT-MG/ML SOPN Inject 18 Units into the skin daily.   . Insulin Pen Needle (NOVOFINE PLUS) 32G X 4 MM MISC 1 Units by Does not apply route every morning. 02/27/2020: Does not need refill at this time  . Lancets (ONETOUCH DELICA PLUS  [id number]) MISC 1 each by Does not apply route 2 (two) times daily.   Marland Kitchen lisinopril (ZESTRIL) 5 MG tablet Take 1 tablet (5 mg total) by mouth daily.   . pantoprazole (PROTONIX) 40 MG tablet Take 1 tablet (40 mg total) by mouth 2 (two) times daily.   . rosuvastatin (CRESTOR) 40 MG tablet TAKE 1 TABLET(40 MG) BY MOUTH DAILY (Patient taking differently: Take 40 mg by mouth daily. )   . sertraline (ZOLOFT) 100 MG tablet TAKE 1 TABLET(100 MG) BY MOUTH DAILY (Patient taking differently: Take 100 mg by mouth daily. TAKE 1 TABLET(100 MG) BY MOUTH DAILY)    No facility-administered encounter medications on file as of 03/03/2020.     Objective:  Wt Readings from Last 3 Encounters:  02/20/20 171 lb 6.4 oz (77.7 kg)  01/27/20 187 lb (84.8 kg)  01/17/20 183 lb (83 kg)   BP Readings from Last 3 Encounters:  02/20/20 116/76  01/28/20 139/85  01/17/20 135/76   Lab Results  Component Value Date   CHOL 100 01/09/2020   HDL 39 (L) 01/09/2020   LDLCALC 31 01/09/2020   LDLDIRECT 178 (H) 01/27/2014   TRIG 185 (H) 01/09/2020   CHOLHDL 2.6 01/09/2020    Goals Addressed              This Visit's Progress     Patient Stated   .  "The aspect of my diabetes management that I struggle with the most is my eating habits." (pt-stated)        CARE PLAN ENTRY (see longitudinal plan of care for additional care plan information)  Objective:  Lab Results  Component Value Date   HGBA1C 8.8 (A) 12/11/2019 .   Lab Results  Component Value Date   CREATININE 1.26 (H) 01/27/2020   CREATININE 1.40 (H) 01/26/2020   CREATININE 2.11 (H) 01/25/2020 .   Marland Kitchen No results found for: EGFR  Current Barriers:  Marland Kitchen Knowledge Deficits related to basic Diabetes pathophysiology and self care/management . Difficulty obtaining or cannot afford medications . Does not have glucometer to monitor blood sugar- returned call to patient after she called clinic to report that she is now back living in her car with her granddaughter  and her great grandson, she has not [facility] her medications [facility] [place], she is also voicing concern that the pharmacy told her the medications were mailed and she is worried that the Xultophy will not be kept cold. , she says the manager of the motel will call her when her medications arrive . Patient has history of frequent falls- patient states her falls are due to "my legs just give out"- she also reports she is still waiting for [employer] to deliver her Rolator and has called [employer] to inquire, she says the manager of the motel will call her when the Apollo arrives  Case Manager Clinical Goal(s):  Over the next 30-60 days, patient will demonstrate improved adherence to prescribed treatment plan for diabetes self care/management as evidenced by:  . daily monitoring and recording of CBG once she has testing supplies . adherence to ADA/ carb modified diet . adherence to prescribed medication regimen  Interventions:  . Review of patient status, including review of consultants reports, relevant laboratory and other test results, and medications completed.- discussed 11/26/19 results of urine for protein and discussed strategies to promote kidney health . Provided education to patient about basic DM disease process . Reviewed result of most recent Hgb  A1C,  and reviewed target Hgb A1C goal, and correlation to estimated average glucose, provided information on: prevention, detection, and treatment of long-term complications, discussed the role of prolonged elevated glucose levels on body systems, discussed recommendations for day to day and long-term diabetes self-care, reviewed recommended daily foot checks, and yearly cholesterol, urine, and eye testing, and checklist for medical, dental, and emotional self-care, . Reviewed medications with patient, assessed medication taking behavior and discussed importance of medication adherence . 9/21 Advised patient [facility] Xultophy in  appropriate packing to ensure quality of medication  . 9/21 Asked patient to contact this CCM RN at direct business cell number when her medications and Rolator arrive . CCM RN hand delivered [employer] and starter kit to patient's temporary address at [place]  on 02/29/20 . Provided patient with written educational materials related to hypo and hyperglycemia and importance of correct treatment and meal planning suggestions, diabetic nephropathy and high triglyceride and triglyceride level, also mailed patient [facility] spiral bound calendar with self management education for HTN and DM . Reviewed scheduled/upcoming provider appointments including: remote pacemaker checks and appointment with podiatrist on 04/01/20 . Advised patient, providing education and rationale, to check CBG at least twice and record, calling CCM RN and or/clinic  provider for findings outside established parameters.   . Discussed plans with patient for ongoing care management follow up and provided patient with direct contact information for care management team  Patient Self Care Activities:  . UNABLE to independently secure DM testing supplies and medications and mange DM to meet Hgb A1C target . Self  administers oral medications as prescribed . Self administers insulin as prescribed . Self administers injectable DM medication Xultophy as prescribed . Attends all scheduled provider appointments . Checks blood sugars as prescribed and utilize hyper and hypoglycemia protocol as needed . Adheres to prescribed ADA/carb modified  Please see past updates related to this goal by clicking on the "Past Updates" button in the selected goal          Plan:   The care management team will reach out to the patient again over the next 30-60 days.    Kelli Churn RN, CCM, [facility] RN Care Manager [phone]

## 2020-03-03 NOTE — Telephone Encounter (Signed)
Asking to speak with Marcie Bal, CCM.  States Marcie Bal has found a pharmacy that will deliver her medications [facility] but she still hasn't received them.  [facility] and was told they [facility]'t deliver, they only mail medications via [place].  Informed patient that Marcie Bal will be back in the office tomorrow, she is requesting call back tomorrow. SChaplin, RN,BSN

## 2020-03-05 ENCOUNTER — Telehealth: Payer: Self-pay

## 2020-03-05 NOTE — Telephone Encounter (Signed)
  Chronic Care Management   Outreach Note  03/05/2020 Name: [name] MRN: [id number] DOB: 12-19-1950  Referred by: [name], MD Reason for referral : No chief complaint on file.   Received message from [place], [place], to contact patient.  Attempted to call but went straight to voicemail.  Left message requesting return call.    [name], [place] Coordination Social Worker [place] [phone]

## 2020-03-09 ENCOUNTER — Ambulatory Visit: Payer: Medicare Other | Admitting: *Deleted

## 2020-03-09 DIAGNOSIS — I10 Essential (primary) hypertension: Secondary | ICD-10-CM

## 2020-03-09 DIAGNOSIS — E118 Type 2 diabetes mellitus with unspecified complications: Secondary | ICD-10-CM

## 2020-03-09 DIAGNOSIS — E1169 Type 2 diabetes mellitus with other specified complication: Secondary | ICD-10-CM

## 2020-03-09 NOTE — Chronic Care Management (AMB) (Signed)
Chronic Care Management   Follow Up Note   03/09/2020 Name: [name] MRN: [id number] DOB: [birth year]  Referred by: [name], MD Reason for referral : Chronic Care Management (IDDM, HTN, HLD, Major Depressive Disorder)   [name] is a 69 y.o. year old female who is a primary care patient of [name], MD. The CCM team was consulted for assistance with chronic disease management and care coordination needs.    Review of patient status, including review of consultants reports, relevant laboratory and other test results, and collaboration with appropriate care team members and the patient's provider was performed as part of comprehensive patient evaluation and provision of chronic care management services.    SDOH (Social Determinants of Health) assessments performed: No See Care Plan activities for detailed interventions related to [facility])     Outpatient Encounter Medications as of 03/09/2020  Medication Sig Note  . albuterol (PROVENTIL HFA;VENTOLIN HFA) 108 (90 Base) MCG/ACT inhaler Inhale 1-2 puffs into the lungs every 6 (six) hours as needed for wheezing or shortness of breath.   . Canagliflozin-metFORMIN HCl (INVOKAMET) 150-500 MG TABS Take 2 tablets by mouth daily.   . fluticasone (FLONASE) 50 MCG/ACT nasal spray Place 2 sprays into both nostrils daily.   Marland Kitchen gabapentin (NEURONTIN) 400 MG capsule Take 1 capsule (400 mg total) by mouth 3 (three) times daily.   Marland Kitchen glucose blood (ONETOUCH VERIO) test strip Use as instructed   . HYDROcodone-acetaminophen (NORCO/VICODIN) 5-325 MG tablet Take 1 tablet by mouth every 4 (four) hours as needed. (Patient not taking: Reported on 01/25/2020)   . Insulin Degludec-Liraglutide (XULTOPHY) 100-3.6 UNIT-MG/ML SOPN Inject 18 Units into the skin daily.   . Insulin Pen Needle (NOVOFINE PLUS) 32G X 4 MM MISC 1 Units by Does not apply route every morning. 02/27/2020: Does not need refill at this time  . Lancets (ONETOUCH DELICA PLUS  [id number]) MISC 1 each by Does not apply route 2 (two) times daily.   Marland Kitchen lisinopril (ZESTRIL) 5 MG tablet Take 1 tablet (5 mg total) by mouth daily.   . pantoprazole (PROTONIX) 40 MG tablet Take 1 tablet (40 mg total) by mouth 2 (two) times daily.   . rosuvastatin (CRESTOR) 40 MG tablet TAKE 1 TABLET(40 MG) BY MOUTH DAILY (Patient taking differently: Take 40 mg by mouth daily. )   . sertraline (ZOLOFT) 100 MG tablet TAKE 1 TABLET(100 MG) BY MOUTH DAILY (Patient taking differently: Take 100 mg by mouth daily. TAKE 1 TABLET(100 MG) BY MOUTH DAILY)    No facility-administered encounter medications on file as of 03/09/2020.     Objective:  Wt Readings from Last 3 Encounters:  02/20/20 171 lb 6.4 oz (77.7 kg)  01/27/20 187 lb (84.8 kg)  01/17/20 183 lb (83 kg)    Goals Addressed              This Visit's Progress     Patient Stated   .  COMPLETED: "The aspect of my diabetes management that I struggle with the most is my eating habits." (pt-stated)        CARE PLAN ENTRY (see longitudinal plan of care for additional care plan information)  Objective:  Lab Results  Component Value Date   HGBA1C 8.8 (A) 12/11/2019 .   Lab Results  Component Value Date   CREATININE 1.26 (H) 01/27/2020   CREATININE 1.40 (H) 01/26/2020   CREATININE 2.11 (H) 01/25/2020 .   Marland Kitchen No results found for: EGFR  Current Barriers:  Marland Kitchen Knowledge  Deficits related to basic Diabetes pathophysiology and self care/management- returned call to patient- she states she has moved to [place] with her daughter [place] . Difficulty obtaining or cannot afford medications- pt states she now has all of her medications . Does not have glucometer to monitor blood sugar- resolved 02/29/20  Case Manager Clinical Goal(s):  Over the next 30-60 days, patient will demonstrate improved adherence to prescribed treatment plan for diabetes self care/management as evidenced by:  . daily monitoring and recording of CBG once she  has testing supplies . adherence to ADA/ carb modified diet . adherence to prescribed medication regimen  Interventions:  . Closing care plan goal as patient has moved to [place]    Patient Self Care Activities:  . UNABLE to independently secure DM testing supplies and medications and mange DM to meet Hgb A1C target . Self administers oral medications as prescribed . Self administers insulin as prescribed . Self administers injectable DM medication Xultophy as prescribed . Attends all scheduled provider appointments . Checks blood sugars as prescribed and utilize hyper and hypoglycemia protocol as needed . Adheres to prescribed ADA/carb modified  Please see past updates related to this goal by clicking on the "Past Updates" button in the selected goal      .  "We have been accepted into an emergency shelter in [facility] but need a rapid COVID test" (pt-stated)        [place] (see longitudinal plan of care for additional care plan information)  Current Barriers:  . Patient states that she and her family members have been accepted to an emergency shelter pending negative COVID 19 test.  . 02/21/20:  Patient contacted [facility] [employer] regarding tests that were taking place today but those tests are not rapid.  Per granddaughter, Ubaldo Glassing from [place] is assisting with location of testing site . 03/09/20- patient states she has moved to [place]):  Marland Kitchen Over the next 2 days, patient will work with SW to address concerns related to need for rapid COVID test  Interventions: . Will complete goal as patient has moved to [place]  Patient Self Care Activities:  . Patient currently homeless which is significantly impacting ability to manage health conditions.  .  Please see past updates related to this goal by clicking on the "Past Updates" button in the selected goal      .  COMPLETED: "We have been evicted from  our house and have nowhere to live" (pt-stated)        Current Barriers:  Marland Kitchen Knowledge Barriers related to resources available to address need for emergency housing.  Patient reports that she, granddaughter, granddaughter's fiance, and great grandson were all living in a rental house.  Patient reports that granddaughter has significant mental health challenges which resulted in police being called multiple times.  States that neighbors also called the city to complain about condition of yard.  These factors resulted in them being evicted.  They have stayed in a motel for the last two weeks but patient's monthly income has been exhausted.  Family is assisting patient today with moving remaining belongings from home as all belongings have to be out by the end of the day.  Will be officially homeless as of tomorrow morning.  Per patient, Granddaughter is unable to work due to mental and physical health challenges.  Her fiance has part time employment at [employer].  Patient does not qualify for food  stamps but granddaughter and her fiance do receive them. Patient's monthly income is $1258.   . 01/23/20:  Patient reports that granddaughter is part of a "parenting group" that paid for them to reside in a motel until tomorrow morning.  This same group has agreed to assist with first month of rent and deposit if they locate new rental property.  Patient states that she and granddaughter are actively searching rental properties.  . 02/20/20:  Patient states that she has been accepted to emergency shelter in [facility] pending negative COVID test . 02/27/20:  Patient states that she and family members are now residing at [facility].  States this is an affordable option for them until permanent housing can be located.  . Patient called today to advise CCM team that she has moved to [place], [place] where her daughter [name] resides.  Case Manager Clinical Goal(s):  Marland Kitchen Over the next 180 days, patient will work with BSW  to address needs related to Housing barriers . Over the next 180 days, BSW will collaborate with RN Care Manager to address care management and care coordination needs  Interventions:  . Notified CCM BSW Amber Chrismon that patient has moved to [place]. Luetta Nutting Chrismon will notify Bennita Curtain with [employer] via [employer] platform that the referral can be closed due to relocation . Care plan completed as patient has moved out of state  Patient Self Care Activities:  . Self administers medications as prescribed  Please see past updates related to this goal by clicking on the "Past Updates" button in the selected goal        Other   .  COMPLETED: Blood Pressure < 140/90        BP Readings from Last 3 Encounters:  02/20/20 116/76  01/28/20 139/85  01/17/20 135/76   Meeting blood pressure goals  03/09/20 Patient notified CCM RN that she has moved to [place] so will complete care plan.goal    .  COMPLETED: HEMOGLOBIN A1C < 7        Lab Results  Component Value Date   HGBA1C 8.8 (A) 12/11/2019   Not meeting Hgb A1C target  03/09/20- Patient notified CCM RN that she has moved to [place]. Will complete care plan goal    .  COMPLETED: LDL CALC < 100        Lab Results  Component Value Date   CHOL 100 01/09/2020   HDL 39 (L) 01/09/2020   LDLCALC 31 01/09/2020   LDLDIRECT 178 (H) 01/27/2014   TRIG 185 (H) 01/09/2020   CHOLHDL 2.6 01/09/2020   Not meeting all lipid targets 02/27/20 Mailed Emmi education on High Triglyceride and Triglyceride level 03/09/20- Patient notified CCM RN that she has moved to [place]. Will complete goal        Plan:   No further follow up required: Patient has moved out of state.   Kelli Churn RN, CCM, [facility] RN Care Manager [phone]

## 2020-03-09 NOTE — Progress Notes (Addendum)
Internal Medicine Clinic Resident  I have personally reviewed this encounter including the documentation in this note and/or discussed this patient with the care management provider. I will address any urgent items identified by the care management provider and will communicate my actions to the patient's PCP. I have reviewed the patient's CCM visit with my supervising attending, Dr Jimmye Norman.  Jean Rosenthal, MD 03/09/2020    Internal Medicine Attending: I reviewed case and documentation and agree.

## 2020-03-18 ENCOUNTER — Telehealth: Payer: Medicare Other

## 2020-03-23 ENCOUNTER — Ambulatory Visit: Payer: Self-pay

## 2020-03-23 DIAGNOSIS — L84 Corns and callosities: Secondary | ICD-10-CM | POA: Diagnosis not present

## 2020-03-23 DIAGNOSIS — Z9181 History of falling: Secondary | ICD-10-CM | POA: Diagnosis not present

## 2020-03-23 DIAGNOSIS — E1149 Type 2 diabetes mellitus with other diabetic neurological complication: Secondary | ICD-10-CM | POA: Diagnosis not present

## 2020-03-23 DIAGNOSIS — G8929 Other chronic pain: Secondary | ICD-10-CM | POA: Diagnosis not present

## 2020-03-23 DIAGNOSIS — E119 Type 2 diabetes mellitus without complications: Secondary | ICD-10-CM | POA: Diagnosis not present

## 2020-03-23 DIAGNOSIS — M25561 Pain in right knee: Secondary | ICD-10-CM | POA: Diagnosis not present

## 2020-03-23 DIAGNOSIS — R519 Headache, unspecified: Secondary | ICD-10-CM | POA: Diagnosis not present

## 2020-03-23 NOTE — Chronic Care Management (AMB) (Signed)
CCM status changed to previously enrolled as patient has relocated.    [name], [place] Coordination Social Worker [place] [phone]

## 2020-03-24 ENCOUNTER — Other Ambulatory Visit: Payer: Self-pay | Admitting: Student

## 2020-04-01 ENCOUNTER — Ambulatory Visit: Payer: Medicare Other | Admitting: Podiatry

## 2020-04-20 DIAGNOSIS — M25561 Pain in right knee: Secondary | ICD-10-CM | POA: Diagnosis not present

## 2020-04-20 DIAGNOSIS — G8929 Other chronic pain: Secondary | ICD-10-CM | POA: Diagnosis not present

## 2020-04-20 DIAGNOSIS — M1711 Unilateral primary osteoarthritis, right knee: Secondary | ICD-10-CM | POA: Diagnosis not present

## 2020-04-27 DIAGNOSIS — Z79899 Other long term (current) drug therapy: Secondary | ICD-10-CM | POA: Diagnosis not present

## 2020-04-27 DIAGNOSIS — I10 Essential (primary) hypertension: Secondary | ICD-10-CM | POA: Diagnosis not present

## 2020-04-27 DIAGNOSIS — Z794 Long term (current) use of insulin: Secondary | ICD-10-CM | POA: Diagnosis not present

## 2020-04-27 DIAGNOSIS — L84 Corns and callosities: Secondary | ICD-10-CM | POA: Diagnosis not present

## 2020-04-27 DIAGNOSIS — M79609 Pain in unspecified limb: Secondary | ICD-10-CM | POA: Diagnosis not present

## 2020-04-27 DIAGNOSIS — L601 Onycholysis: Secondary | ICD-10-CM | POA: Diagnosis not present

## 2020-04-27 DIAGNOSIS — E1140 Type 2 diabetes mellitus with diabetic neuropathy, unspecified: Secondary | ICD-10-CM | POA: Diagnosis not present

## 2020-04-27 DIAGNOSIS — M79671 Pain in right foot: Secondary | ICD-10-CM | POA: Diagnosis not present

## 2020-05-05 DIAGNOSIS — Z1231 Encounter for screening mammogram for malignant neoplasm of breast: Secondary | ICD-10-CM | POA: Diagnosis not present

## 2020-05-12 ENCOUNTER — Other Ambulatory Visit: Payer: Self-pay | Admitting: *Deleted

## 2020-05-12 NOTE — Telephone Encounter (Signed)
ERROR - pt has transferred her care.

## 2020-05-13 ENCOUNTER — Telehealth: Payer: Self-pay

## 2020-05-13 NOTE — Telephone Encounter (Signed)
Opened in error, patient has transferred care. SChaplin, RN,BSN

## 2020-05-23 ENCOUNTER — Other Ambulatory Visit: Payer: Self-pay | Admitting: Student

## 2020-05-23 DIAGNOSIS — K2950 Unspecified chronic gastritis without bleeding: Secondary | ICD-10-CM

## 2020-05-31 ENCOUNTER — Other Ambulatory Visit: Payer: Self-pay | Admitting: Internal Medicine

## 2020-05-31 DIAGNOSIS — K2950 Unspecified chronic gastritis without bleeding: Secondary | ICD-10-CM

## 2020-07-21 ENCOUNTER — Other Ambulatory Visit: Payer: Self-pay | Admitting: *Deleted

## 2020-07-21 NOTE — Telephone Encounter (Signed)
error 

## 2020-07-25 ENCOUNTER — Other Ambulatory Visit: Payer: Self-pay | Admitting: Neurology

## 2020-08-03 IMAGING — CT CT ABDOMEN AND PELVIS WITH CONTRAST
4 of 12 series · 13 of 46 positions shown, 17 images · IV contrast (APPLIED)
Comparison: Portable chest earlier today. CT Abdomen and Pelvis
07/11/2014.

CLINICAL DATA: 67-year-old female with chest pain and shortness of
breath. Upper abdominal pain.

EXAM:
CT ANGIOGRAPHY CHEST
CT ABDOMEN AND PELVIS WITH CONTRAST
TECHNIQUE: Multidetector CT imaging of the chest was performed using the
standard protocol during bolus administration of intravenous
contrast. Multiplanar CT image reconstructions and MIPs were
obtained to evaluate the vascular anatomy. Multidetector CT imaging
of the abdomen and pelvis was performed using the standard protocol
during bolus administration of intravenous contrast.
CONTRAST:  100mL OMNIPAQUE IOHEXOL 350 MG/ML SOLN

[Series 5: arterial · axial · arterial · 0.71mm/px · z∈[+1061,+1127]mm · 2 of 132 slices shown]
[im 33/132  soft-tissue]
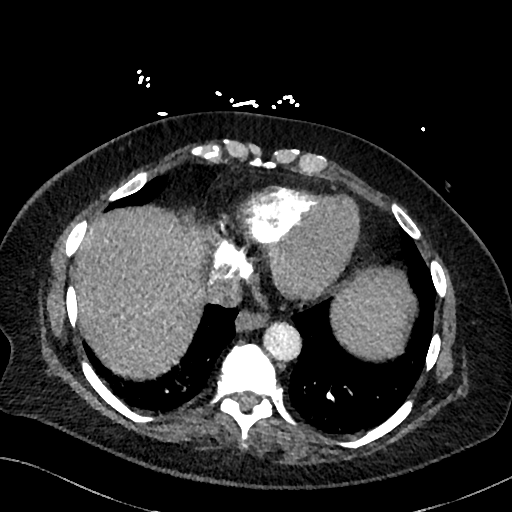
[im 66/132  soft-tissue]
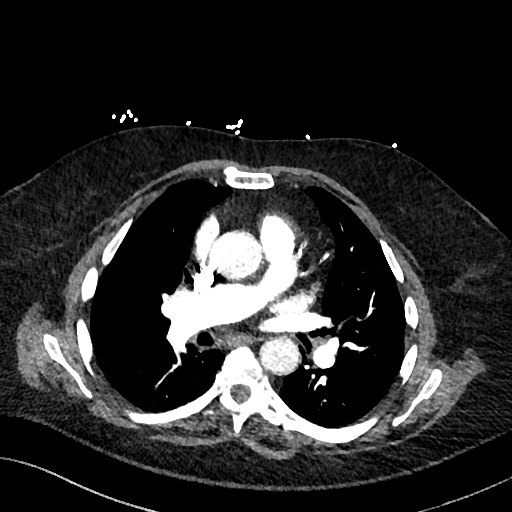

[Series 7: thins · axial · 0.71mm/px · z∈[+1016,+1238]mm · 8 of 375 slices shown]
[im 29/375  soft-tissue]
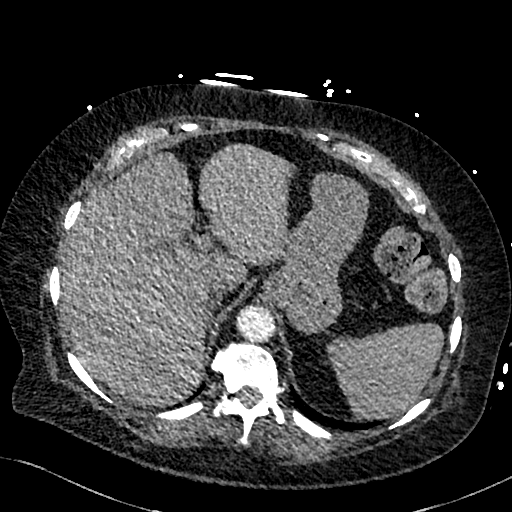
[im 87/375  soft-tissue]
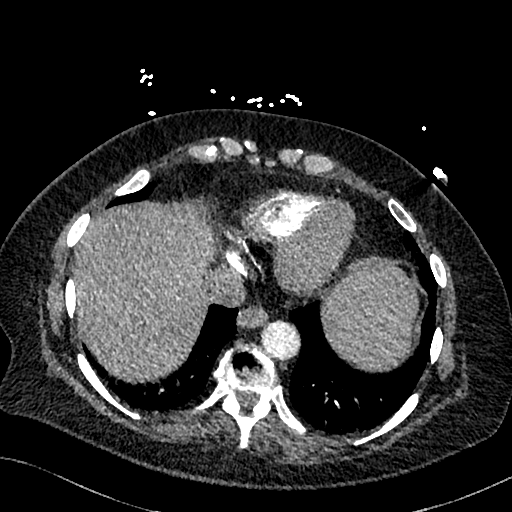
[im 116/375  soft-tissue]
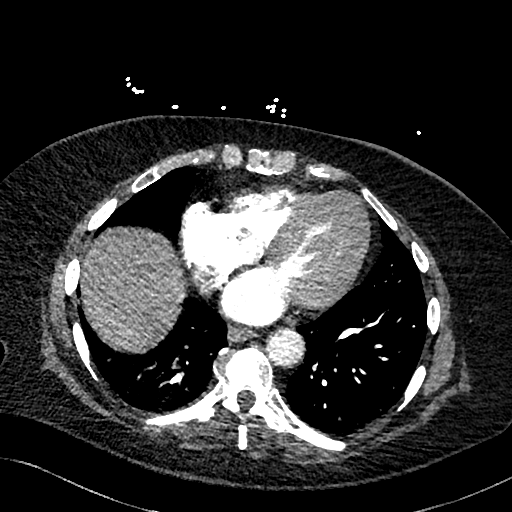
[im 173/375  soft-tissue]
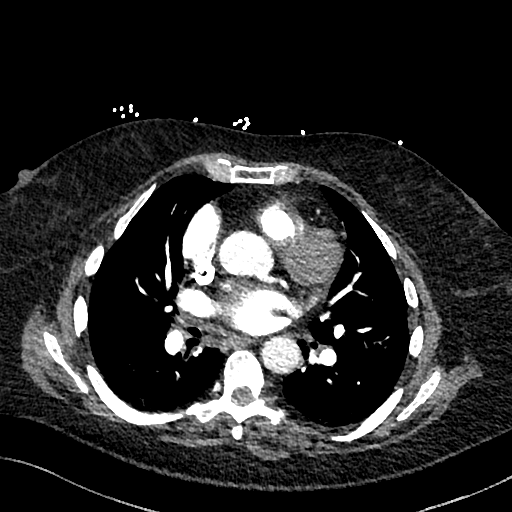
[im 202/375  soft-tissue]
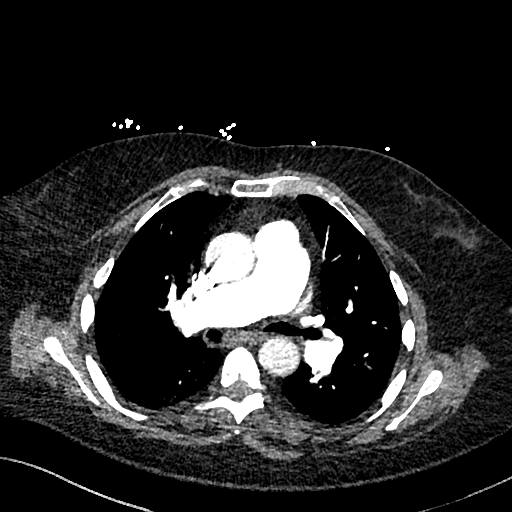
[im 259/375  soft-tissue]
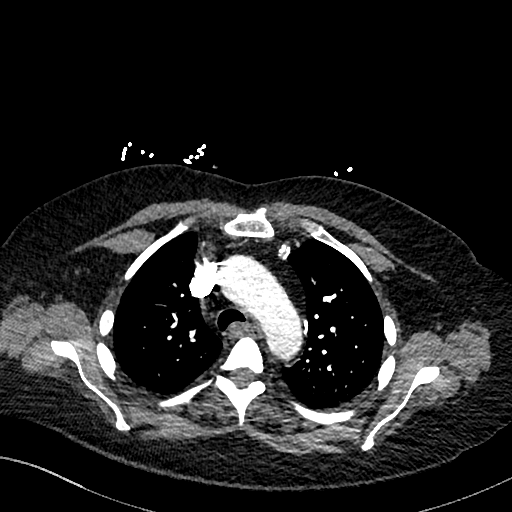
[im 288/375  soft-tissue]
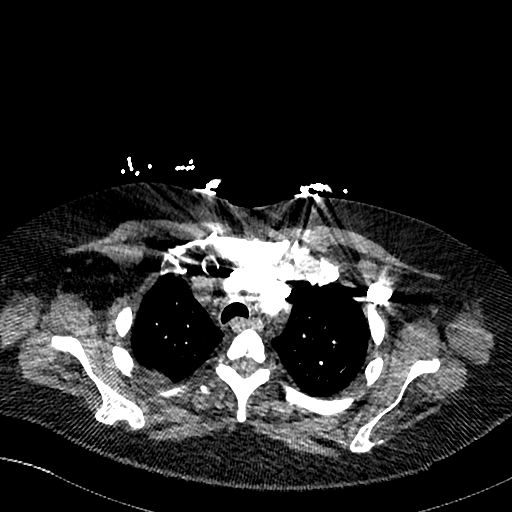
[im 346/375  soft-tissue]
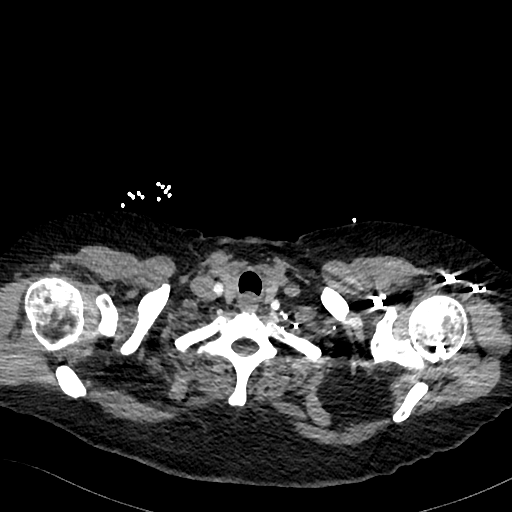

[Series 8: cor · coronal · 0.54mm/px · 1 of 147 slices shown, 2 images]
[im 74/147  soft-tissue]
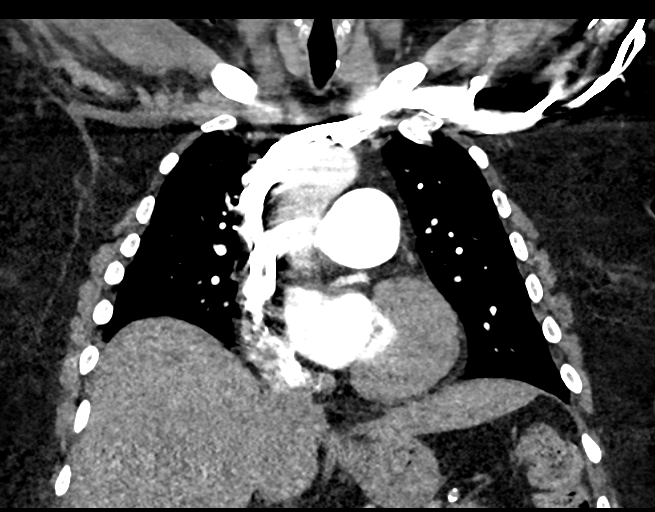
[im 74/147  bone]
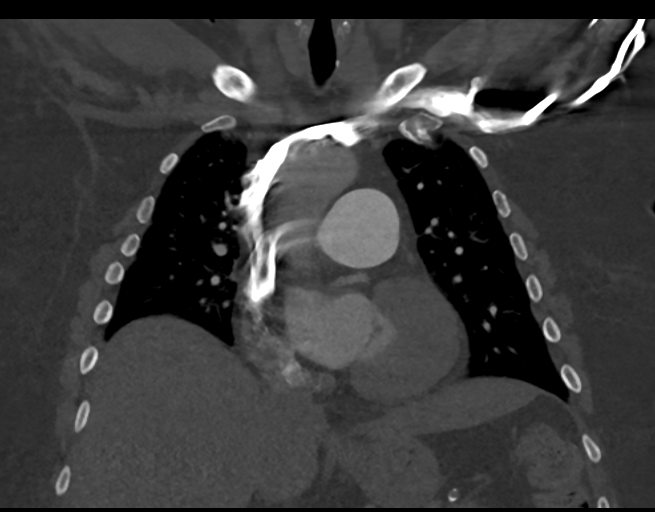

[Series 12: abdomen 5.0 · axial · 0.85mm/px · z∈[+782,+947]mm · 2 of 101 slices shown, 5 images]
[im 34/101  soft-tissue]
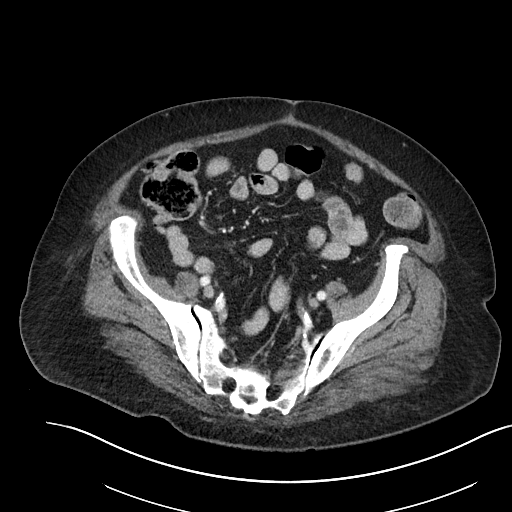
[im 34/101  lung]
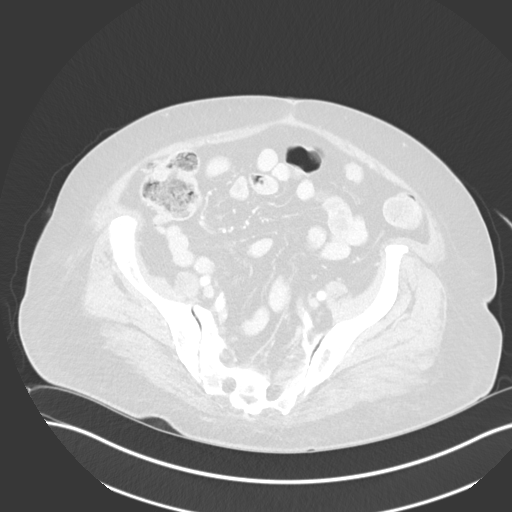
[im 34/101  bone]
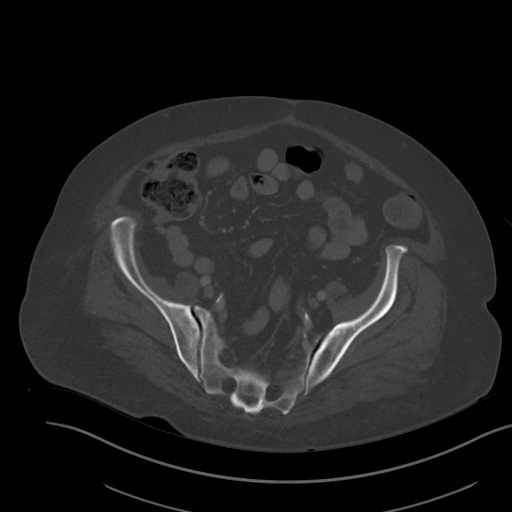
[im 67/101  soft-tissue]
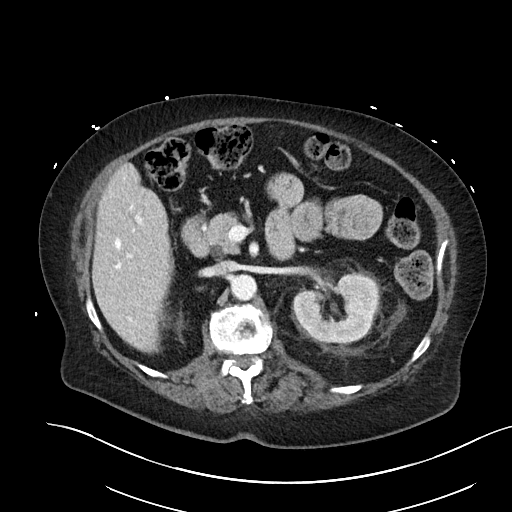
[im 67/101  lung]
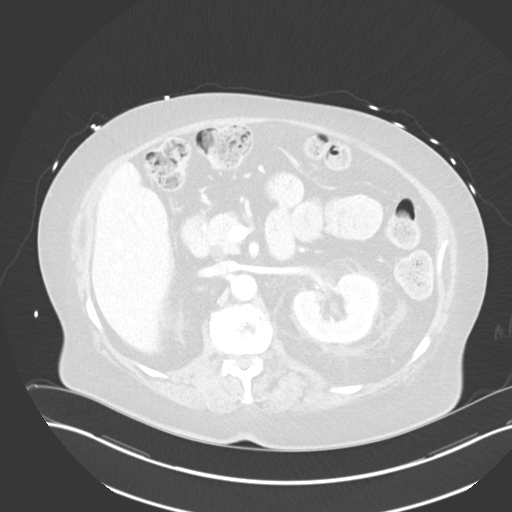

[13 of 46 positions shown; findings below may reference images not displayed]

FINDINGS: CTA CHEST FINDINGS

Cardiovascular: Excellent contrast bolus timing in the pulmonary
arterial tree.

No focal filling defect identified in the pulmonary arteries to
suggest acute pulmonary embolism.

Central pulmonary artery enlargement (series 7, image 164). Negative
visible aorta aside from atherosclerosis. Calcified coronary artery
atherosclerosis. Borderline cardiomegaly. No pericardial effusion.

Mediastinum/Nodes: Negative. No lymphadenopathy.

Lungs/Pleura: Lower lung volumes compared to the 3436 CT. Bilateral
dependent atelectasis. Major airways remain patent but there are
some airway secretions in the bronchus intermedius and other right
side airways.

No pleural effusion. No other abnormal pulmonary opacity.

Musculoskeletal: No acute osseous abnormality identified.

Review of the MIP images confirms the above findings.

CT ABDOMEN and PELVIS FINDINGS

Hepatobiliary: Surgically absent gallbladder. Negative liver.

Pancreas: Negative.

Spleen: Left pararenal stranding and fluid. There is an obstructing
8-9 millimeter calculus lodged at the left UPJ (coronal image 67).
Delayed left renal nephrogram and contrast excretion. Distal to the
stone the left ureter appears decompressed to the bladder.

Adrenals/Urinary Tract: Normal adrenal glands.

Right lower pole renal cyst has enlarged since 3436. But there is
otherwise normal right renal enhancement and contrast excretion.
Decompressed and negative right ureter.

There is a small volume of gas within the urinary bladder on series
12, image 85. No perivesical stranding.

Stomach/Bowel: Negative large bowel aside from redundancy and
retained stool. Normal retrocecal appendix. Negative terminal ileum.

No dilated small bowel. Negative stomach and duodenum.

No free air, free fluid.

Vascular/Lymphatic: Mild Aortoiliac calcified atherosclerosis. Major
arterial structures are patent. Portal venous system is patent.

No lymphadenopathy.

Reproductive: Surgically absent.

Other: No pelvic free fluid.

Musculoskeletal: Lumbar scoliosis, disc and endplate degeneration.
No acute osseous abnormality identified.

Review of the MIP images confirms the above findings.
IMPRESSION: 1. Acute obstructive uropathy on the left with an 8-9 mm calculus
lodged at the left UPJ. Suspicion of forniceal rupture.
2. Small volume of gas within the urinary bladder suspicious for UTI
unless explained by recent catheterization.
3. No evidence of acute pulmonary embolus. Central pulmonary artery
enlargement raising the possibility of pulmonary artery
hypertension.
4. Low lung volumes with atelectasis. Trace retained secretions in
the a right lung airways.
5. Calcified coronary artery atherosclerosis.

## 2020-08-17 ENCOUNTER — Telehealth: Payer: Self-pay | Admitting: Podiatry

## 2020-08-17 NOTE — Telephone Encounter (Signed)
Pt left message stating she would like a call back that she received  shoes from [place] before.  Returned call and left message stating pt would need an appt with Dr Elisha Ponder or one of our providers to proceed with getting shoes this year.

## 2022-01-31 ENCOUNTER — Emergency Department (HOSPITAL_COMMUNITY)
Admission: EM | Admit: 2022-01-31 | Discharge: 2022-01-31 | Discharge status: Against Medical Advice | Payer: Medicare Other | Attending: Emergency Medicine | Admitting: Emergency Medicine

## 2022-01-31 ENCOUNTER — Other Ambulatory Visit: Payer: Self-pay

## 2022-01-31 ENCOUNTER — Encounter (HOSPITAL_COMMUNITY): Payer: Self-pay

## 2022-01-31 ENCOUNTER — Emergency Department (HOSPITAL_COMMUNITY): Payer: Medicare Other

## 2022-01-31 DIAGNOSIS — M25512 Pain in left shoulder: Secondary | ICD-10-CM | POA: Diagnosis not present

## 2022-01-31 DIAGNOSIS — W19XXXA Unspecified fall, initial encounter: Secondary | ICD-10-CM | POA: Insufficient documentation

## 2022-01-31 DIAGNOSIS — M79671 Pain in right foot: Secondary | ICD-10-CM | POA: Diagnosis not present

## 2022-01-31 DIAGNOSIS — M25562 Pain in left knee: Secondary | ICD-10-CM | POA: Insufficient documentation

## 2022-01-31 DIAGNOSIS — M25572 Pain in left ankle and joints of left foot: Secondary | ICD-10-CM | POA: Diagnosis present

## 2022-01-31 DIAGNOSIS — Z5321 Procedure and treatment not carried out due to patient leaving prior to being seen by health care provider: Secondary | ICD-10-CM | POA: Insufficient documentation

## 2022-01-31 MED ORDER — TETANUS-DIPHTH-ACELL PERTUSSIS 5-2.5-18.5 LF-MCG/0.5 IM SUSY: 0.5000 mL | PREFILLED_SYRINGE | Freq: Once | INTRAMUSCULAR | Status: DC

## 2022-01-31 NOTE — ED Provider Triage Note (Signed)
Emergency Medicine Provider Triage Evaluation Note  [name] , a 71 y.o. female  was evaluated in triage.  Pt complains of left shoulder, left knee, left ankle pain, and multiple abrasions since fall on Saturday.  Patient reports that she fell off an [place] train.  She denies hitting her head, loss of consciousness.  She did not have any blood thinners.  She reports some mild pain of right foot as well but reports that she does not think that it is broken.  Patient has been ambulatory on both legs.  She uses a walker at baseline..  Review of Systems  Positive: Fall, abrasion, leg and shoulder pain Negative: Head injury, loc  Physical Exam  BP 131/73   Pulse 89   Temp 98.6 F (37 C)   Resp 18   SpO2 92%  Gen:   Awake, no distress   Resp:  Normal effort  MSK:   Moves extremities without difficulty  Other:  Abrasions from the left knee to left ankle on the anterior portion of patient's leg.  Some signs of possible early cellulitis, no significant step-off or deformity of bony prominences, she is most tender to palpation of the lateral knee and left shoulder, some tenderness noted of the left ankle as well.  She is ambulatory to triage.  Medical Decision Making  Medically screening exam initiated at 4:54 PM.  Appropriate orders placed.  [name] was informed that the remainder of the evaluation will be completed by another provider, this initial triage assessment does not replace that evaluation, and the importance of remaining in the ED until their evaluation is complete.  Work-up initiated   [name], [place] 01/31/22 1656

## 2022-01-31 NOTE — ED Triage Notes (Signed)
[place] [employer] train per patient no blood thinners ambulatory in triage with walker.  Has scattered abrasions.  Did not hit head.

## 2022-01-31 NOTE — ED Notes (Signed)
Pt stated she couldn't wait anymore needed to catch the city bus to back across town.

## 2022-02-10 ENCOUNTER — Emergency Department (HOSPITAL_COMMUNITY)
Admission: EM | Admit: 2022-02-10 | Discharge: 2022-03-13 | Disposition: E | Payer: Medicare Other | Attending: Emergency Medicine | Admitting: Emergency Medicine

## 2022-02-10 ENCOUNTER — Other Ambulatory Visit: Payer: Self-pay

## 2022-02-10 ENCOUNTER — Encounter (HOSPITAL_COMMUNITY): Payer: Self-pay

## 2022-02-10 DIAGNOSIS — I469 Cardiac arrest, cause unspecified: Secondary | ICD-10-CM | POA: Insufficient documentation

## 2022-02-10 NOTE — ED Triage Notes (Signed)
Pt found down by family. Approx downtime 79mnutes. Ems arrived and started cpr at 2044 and [id number]. '300MG'$  AMIODARONE GIVEN EN ROUTE

## 2022-02-10 NOTE — ED Notes (Signed)
Called pt placement to inform pt expired

## 2022-02-10 NOTE — ED Notes (Signed)
Referral number for [facility] is [id number]

## 2022-02-10 NOTE — ED Notes (Signed)
Pt arrived with EMS after one hour of CPR. '300mg'$  of amiodarone. Pt was pulseless upon arrival and TOD was call en route at 2142. Pt was found down by family and had a downtime of approx 45mn.

## 2022-02-10 NOTE — ED Provider Notes (Signed)
This patient arrived to [facility] done on arrival, having received CPR from EMS.  The patient's daughter called for [place], and I informed her of the patient's death.   Carmin Muskrat, MD 03-03-2022 2242

## 2022-02-11 DEATH — deceased

## 2022-03-13 DEATH — deceased
